# Patient Record
Sex: Male | Born: 1945 | Race: White | Hispanic: No | Marital: Married | Smoking: Former smoker
Health system: Southern US, Community
[De-identification: ages and names within clinical notes are randomized; demographics above are authoritative.]

## PROBLEM LIST (undated history)

## (undated) DIAGNOSIS — J189 Pneumonia, unspecified organism: Secondary | ICD-10-CM

## (undated) DIAGNOSIS — R55 Syncope and collapse: Secondary | ICD-10-CM

## (undated) DIAGNOSIS — E079 Disorder of thyroid, unspecified: Secondary | ICD-10-CM

## (undated) DIAGNOSIS — M2550 Pain in unspecified joint: Secondary | ICD-10-CM

## (undated) DIAGNOSIS — Z9989 Dependence on other enabling machines and devices: Secondary | ICD-10-CM

## (undated) DIAGNOSIS — M1990 Unspecified osteoarthritis, unspecified site: Secondary | ICD-10-CM

## (undated) DIAGNOSIS — I10 Essential (primary) hypertension: Secondary | ICD-10-CM

## (undated) DIAGNOSIS — G20 Parkinson's disease: Secondary | ICD-10-CM

## (undated) DIAGNOSIS — D759 Disease of blood and blood-forming organs, unspecified: Secondary | ICD-10-CM

## (undated) DIAGNOSIS — R7303 Prediabetes: Secondary | ICD-10-CM

## (undated) DIAGNOSIS — G20C Parkinsonism, unspecified: Secondary | ICD-10-CM

## (undated) DIAGNOSIS — Z9189 Other specified personal risk factors, not elsewhere classified: Secondary | ICD-10-CM

## (undated) DIAGNOSIS — C679 Malignant neoplasm of bladder, unspecified: Secondary | ICD-10-CM

## (undated) DIAGNOSIS — F32A Depression, unspecified: Secondary | ICD-10-CM

## (undated) DIAGNOSIS — N200 Calculus of kidney: Secondary | ICD-10-CM

## (undated) DIAGNOSIS — H9190 Unspecified hearing loss, unspecified ear: Secondary | ICD-10-CM

## (undated) DIAGNOSIS — G4733 Obstructive sleep apnea (adult) (pediatric): Secondary | ICD-10-CM

## (undated) DIAGNOSIS — M549 Dorsalgia, unspecified: Secondary | ICD-10-CM

## (undated) DIAGNOSIS — Z87442 Personal history of urinary calculi: Secondary | ICD-10-CM

## (undated) DIAGNOSIS — F329 Major depressive disorder, single episode, unspecified: Secondary | ICD-10-CM

## (undated) DIAGNOSIS — G8929 Other chronic pain: Secondary | ICD-10-CM

## (undated) DIAGNOSIS — G44009 Cluster headache syndrome, unspecified, not intractable: Secondary | ICD-10-CM

## (undated) DIAGNOSIS — F419 Anxiety disorder, unspecified: Secondary | ICD-10-CM

## (undated) HISTORY — PX: REFRACTIVE SURGERY: SHX103

## (undated) HISTORY — PX: OTHER SURGICAL HISTORY: SHX169

## (undated) HISTORY — DX: Essential (primary) hypertension: I10

## (undated) HISTORY — DX: Disorder of thyroid, unspecified: E07.9

## (undated) HISTORY — DX: Unspecified hearing loss, unspecified ear: H91.90

## (undated) HISTORY — PX: CARPAL TUNNEL RELEASE: SHX101

## (undated) HISTORY — DX: Other chronic pain: G89.29

## (undated) HISTORY — DX: Syncope and collapse: R55

## (undated) HISTORY — PX: BLADDER SURGERY: SHX569

## (undated) HISTORY — DX: Anxiety disorder, unspecified: F41.9

## (undated) HISTORY — DX: Cluster headache syndrome, unspecified, not intractable: G44.009

## (undated) HISTORY — PX: RETINAL DETACHMENT SURGERY: SHX105

## (undated) HISTORY — DX: Malignant neoplasm of bladder, unspecified: C67.9

## (undated) HISTORY — PX: CYSTOSCOPY: SUR368

## (undated) HISTORY — DX: Parkinson's disease: G20

## (undated) HISTORY — DX: Dorsalgia, unspecified: M54.9

## (undated) HISTORY — DX: Parkinsonism, unspecified: G20.C

## (undated) HISTORY — PX: BACK SURGERY: SHX140

---
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
## 1998-12-20 ENCOUNTER — Emergency Department (HOSPITAL_COMMUNITY): Admission: EM | Admit: 1998-12-20 | Discharge: 1998-12-20 | Payer: Self-pay | Admitting: Internal Medicine

## 2001-09-02 ENCOUNTER — Emergency Department (HOSPITAL_COMMUNITY): Admission: AC | Admit: 2001-09-02 | Discharge: 2001-09-03 | Payer: Self-pay

## 2002-03-24 ENCOUNTER — Ambulatory Visit (HOSPITAL_COMMUNITY): Admission: RE | Admit: 2002-03-24 | Discharge: 2002-03-24 | Payer: Self-pay | Admitting: Internal Medicine

## 2002-03-24 ENCOUNTER — Encounter: Payer: Self-pay | Admitting: Internal Medicine

## 2003-06-12 ENCOUNTER — Ambulatory Visit (HOSPITAL_COMMUNITY): Admission: RE | Admit: 2003-06-12 | Discharge: 2003-06-12 | Payer: Self-pay | Admitting: Internal Medicine

## 2003-06-12 ENCOUNTER — Encounter: Payer: Self-pay | Admitting: Internal Medicine

## 2005-01-22 ENCOUNTER — Ambulatory Visit: Payer: Self-pay | Admitting: Orthopedic Surgery

## 2005-02-04 ENCOUNTER — Ambulatory Visit: Payer: Self-pay | Admitting: Orthopedic Surgery

## 2005-03-04 ENCOUNTER — Ambulatory Visit: Payer: Self-pay | Admitting: Orthopedic Surgery

## 2006-07-15 ENCOUNTER — Ambulatory Visit (HOSPITAL_COMMUNITY): Admission: RE | Admit: 2006-07-15 | Discharge: 2006-07-15 | Payer: Self-pay | Admitting: Family Medicine

## 2006-07-26 ENCOUNTER — Encounter (INDEPENDENT_AMBULATORY_CARE_PROVIDER_SITE_OTHER): Payer: Self-pay | Admitting: *Deleted

## 2006-07-26 ENCOUNTER — Ambulatory Visit (HOSPITAL_COMMUNITY): Admission: RE | Admit: 2006-07-26 | Discharge: 2006-07-27 | Payer: Self-pay | Admitting: Urology

## 2006-07-29 ENCOUNTER — Observation Stay (HOSPITAL_COMMUNITY): Admission: EM | Admit: 2006-07-29 | Discharge: 2006-07-30 | Payer: Self-pay | Admitting: Emergency Medicine

## 2006-08-20 ENCOUNTER — Emergency Department (HOSPITAL_COMMUNITY): Admission: EM | Admit: 2006-08-20 | Discharge: 2006-08-20 | Payer: Self-pay | Admitting: Emergency Medicine

## 2006-08-21 ENCOUNTER — Emergency Department (HOSPITAL_COMMUNITY): Admission: EM | Admit: 2006-08-21 | Discharge: 2006-08-21 | Payer: Self-pay | Admitting: *Deleted

## 2006-09-03 ENCOUNTER — Encounter (INDEPENDENT_AMBULATORY_CARE_PROVIDER_SITE_OTHER): Payer: Self-pay | Admitting: Specialist

## 2006-09-04 ENCOUNTER — Inpatient Hospital Stay (HOSPITAL_COMMUNITY): Admission: RE | Admit: 2006-09-04 | Discharge: 2006-09-05 | Payer: Self-pay | Admitting: Urology

## 2006-10-18 ENCOUNTER — Ambulatory Visit (HOSPITAL_COMMUNITY): Payer: Self-pay | Admitting: Psychology

## 2006-10-28 ENCOUNTER — Ambulatory Visit (HOSPITAL_COMMUNITY): Payer: Self-pay | Admitting: Psychology

## 2006-11-04 ENCOUNTER — Ambulatory Visit (HOSPITAL_COMMUNITY): Payer: Self-pay | Admitting: Psychology

## 2006-11-11 ENCOUNTER — Ambulatory Visit (HOSPITAL_COMMUNITY): Payer: Self-pay | Admitting: Psychology

## 2006-11-25 ENCOUNTER — Ambulatory Visit (HOSPITAL_COMMUNITY): Payer: Self-pay | Admitting: Psychology

## 2006-12-14 ENCOUNTER — Ambulatory Visit (HOSPITAL_COMMUNITY): Payer: Self-pay | Admitting: Psychology

## 2007-01-18 ENCOUNTER — Ambulatory Visit (HOSPITAL_COMMUNITY): Payer: Self-pay | Admitting: Psychology

## 2007-02-18 ENCOUNTER — Ambulatory Visit (HOSPITAL_COMMUNITY): Payer: Self-pay | Admitting: Psychology

## 2007-04-01 ENCOUNTER — Ambulatory Visit (HOSPITAL_COMMUNITY): Payer: Self-pay | Admitting: Psychology

## 2007-05-03 ENCOUNTER — Ambulatory Visit (HOSPITAL_COMMUNITY): Payer: Self-pay | Admitting: Psychology

## 2007-06-03 ENCOUNTER — Ambulatory Visit (HOSPITAL_COMMUNITY): Payer: Self-pay | Admitting: Psychology

## 2007-07-08 ENCOUNTER — Ambulatory Visit (HOSPITAL_COMMUNITY): Payer: Self-pay | Admitting: Psychology

## 2007-07-14 ENCOUNTER — Encounter (INDEPENDENT_AMBULATORY_CARE_PROVIDER_SITE_OTHER): Payer: Self-pay | Admitting: Urology

## 2007-07-14 ENCOUNTER — Ambulatory Visit (HOSPITAL_BASED_OUTPATIENT_CLINIC_OR_DEPARTMENT_OTHER): Admission: RE | Admit: 2007-07-14 | Discharge: 2007-07-14 | Payer: Self-pay | Admitting: Urology

## 2007-07-21 ENCOUNTER — Ambulatory Visit (HOSPITAL_COMMUNITY): Payer: Self-pay | Admitting: Psychology

## 2007-08-12 ENCOUNTER — Encounter (INDEPENDENT_AMBULATORY_CARE_PROVIDER_SITE_OTHER): Payer: Self-pay | Admitting: Urology

## 2007-08-12 ENCOUNTER — Ambulatory Visit (HOSPITAL_BASED_OUTPATIENT_CLINIC_OR_DEPARTMENT_OTHER): Admission: RE | Admit: 2007-08-12 | Discharge: 2007-08-12 | Payer: Self-pay | Admitting: Urology

## 2007-08-23 ENCOUNTER — Ambulatory Visit (HOSPITAL_COMMUNITY): Payer: Self-pay | Admitting: Psychology

## 2007-09-20 ENCOUNTER — Ambulatory Visit (HOSPITAL_COMMUNITY): Payer: Self-pay | Admitting: Psychology

## 2007-10-19 ENCOUNTER — Ambulatory Visit (HOSPITAL_COMMUNITY): Payer: Self-pay | Admitting: Psychology

## 2007-10-21 ENCOUNTER — Ambulatory Visit (HOSPITAL_COMMUNITY): Admission: RE | Admit: 2007-10-21 | Discharge: 2007-10-21 | Payer: Self-pay | Admitting: Family Medicine

## 2007-11-22 ENCOUNTER — Ambulatory Visit (HOSPITAL_COMMUNITY): Payer: Self-pay | Admitting: Psychology

## 2007-12-26 ENCOUNTER — Encounter (INDEPENDENT_AMBULATORY_CARE_PROVIDER_SITE_OTHER): Payer: Self-pay | Admitting: Urology

## 2007-12-26 ENCOUNTER — Ambulatory Visit (HOSPITAL_BASED_OUTPATIENT_CLINIC_OR_DEPARTMENT_OTHER): Admission: RE | Admit: 2007-12-26 | Discharge: 2007-12-27 | Payer: Self-pay | Admitting: Urology

## 2007-12-31 ENCOUNTER — Emergency Department (HOSPITAL_COMMUNITY): Admission: EM | Admit: 2007-12-31 | Discharge: 2007-12-31 | Payer: Self-pay | Admitting: Emergency Medicine

## 2008-02-07 ENCOUNTER — Ambulatory Visit (HOSPITAL_COMMUNITY): Payer: Self-pay | Admitting: Psychology

## 2008-02-10 ENCOUNTER — Ambulatory Visit (HOSPITAL_COMMUNITY): Payer: Self-pay | Admitting: Psychology

## 2008-03-20 ENCOUNTER — Ambulatory Visit (HOSPITAL_COMMUNITY): Payer: Self-pay | Admitting: Psychology

## 2008-04-23 ENCOUNTER — Ambulatory Visit (HOSPITAL_COMMUNITY): Payer: Self-pay | Admitting: Psychology

## 2009-04-23 ENCOUNTER — Ambulatory Visit (HOSPITAL_COMMUNITY): Payer: Self-pay | Admitting: Psychology

## 2009-04-27 ENCOUNTER — Emergency Department (HOSPITAL_COMMUNITY): Admission: EM | Admit: 2009-04-27 | Discharge: 2009-04-27 | Payer: Self-pay | Admitting: Emergency Medicine

## 2009-05-08 ENCOUNTER — Ambulatory Visit (HOSPITAL_COMMUNITY): Payer: Self-pay | Admitting: Psychology

## 2009-05-22 ENCOUNTER — Ambulatory Visit (HOSPITAL_COMMUNITY): Payer: Self-pay | Admitting: Psychology

## 2009-06-17 ENCOUNTER — Ambulatory Visit (HOSPITAL_COMMUNITY): Payer: Self-pay | Admitting: Psychology

## 2009-07-02 ENCOUNTER — Encounter (HOSPITAL_COMMUNITY): Admission: RE | Admit: 2009-07-02 | Discharge: 2009-08-01 | Payer: Self-pay | Admitting: Neurosurgery

## 2009-07-17 ENCOUNTER — Ambulatory Visit (HOSPITAL_COMMUNITY): Payer: Self-pay | Admitting: Psychology

## 2009-10-21 ENCOUNTER — Ambulatory Visit (HOSPITAL_COMMUNITY): Admission: RE | Admit: 2009-10-21 | Discharge: 2009-10-21 | Payer: Self-pay | Admitting: Neurosurgery

## 2009-10-28 ENCOUNTER — Ambulatory Visit (HOSPITAL_COMMUNITY): Admission: RE | Admit: 2009-10-28 | Discharge: 2009-10-28 | Payer: Self-pay | Admitting: Neurosurgery

## 2009-12-24 ENCOUNTER — Ambulatory Visit (HOSPITAL_COMMUNITY): Admission: RE | Admit: 2009-12-24 | Discharge: 2009-12-25 | Payer: Self-pay | Admitting: Neurosurgery

## 2010-02-03 ENCOUNTER — Encounter (HOSPITAL_COMMUNITY): Admission: RE | Admit: 2010-02-03 | Discharge: 2010-03-05 | Payer: Self-pay | Admitting: Neurosurgery

## 2010-02-21 ENCOUNTER — Emergency Department (HOSPITAL_COMMUNITY): Admission: EM | Admit: 2010-02-21 | Discharge: 2010-02-21 | Payer: Self-pay | Admitting: Emergency Medicine

## 2010-03-06 ENCOUNTER — Encounter (HOSPITAL_COMMUNITY): Admission: RE | Admit: 2010-03-06 | Discharge: 2010-04-05 | Payer: Self-pay | Admitting: Neurosurgery

## 2010-03-09 ENCOUNTER — Ambulatory Visit (HOSPITAL_COMMUNITY): Admission: RE | Admit: 2010-03-09 | Discharge: 2010-03-09 | Payer: Self-pay | Admitting: Emergency Medicine

## 2010-03-26 ENCOUNTER — Ambulatory Visit (HOSPITAL_COMMUNITY): Payer: Self-pay | Admitting: Psychology

## 2010-07-01 ENCOUNTER — Emergency Department (HOSPITAL_COMMUNITY): Admission: EM | Admit: 2010-07-01 | Discharge: 2010-07-01 | Payer: Self-pay | Admitting: Emergency Medicine

## 2010-07-10 ENCOUNTER — Ambulatory Visit: Payer: Self-pay | Admitting: Otolaryngology

## 2010-07-11 ENCOUNTER — Encounter: Admission: RE | Admit: 2010-07-11 | Discharge: 2010-07-11 | Payer: Self-pay | Admitting: Neurosurgery

## 2010-07-15 ENCOUNTER — Ambulatory Visit (HOSPITAL_COMMUNITY): Admission: RE | Admit: 2010-07-15 | Discharge: 2010-07-15 | Payer: Self-pay | Admitting: Otolaryngology

## 2010-07-24 ENCOUNTER — Ambulatory Visit: Payer: Self-pay | Admitting: Otolaryngology

## 2010-10-22 ENCOUNTER — Ambulatory Visit: Payer: Self-pay | Admitting: Orthopedic Surgery

## 2010-10-22 DIAGNOSIS — M659 Synovitis and tenosynovitis, unspecified: Secondary | ICD-10-CM | POA: Insufficient documentation

## 2010-10-22 DIAGNOSIS — M65979 Unspecified synovitis and tenosynovitis, unspecified ankle and foot: Secondary | ICD-10-CM | POA: Insufficient documentation

## 2010-11-13 ENCOUNTER — Ambulatory Visit: Payer: Self-pay | Admitting: Otolaryngology

## 2010-12-05 ENCOUNTER — Ambulatory Visit (HOSPITAL_COMMUNITY): Payer: Self-pay | Admitting: Psychology

## 2010-12-15 ENCOUNTER — Ambulatory Visit (HOSPITAL_COMMUNITY): Payer: Self-pay | Admitting: Psychology

## 2010-12-17 ENCOUNTER — Ambulatory Visit: Payer: Self-pay | Admitting: Orthopedic Surgery

## 2010-12-31 ENCOUNTER — Ambulatory Visit (HOSPITAL_COMMUNITY)
Admission: RE | Admit: 2010-12-31 | Discharge: 2010-12-31 | Payer: Self-pay | Source: Home / Self Care | Attending: Psychology | Admitting: Psychology

## 2011-01-14 ENCOUNTER — Ambulatory Visit (HOSPITAL_COMMUNITY)
Admission: RE | Admit: 2011-01-14 | Discharge: 2011-01-14 | Payer: Self-pay | Source: Home / Self Care | Attending: Psychology | Admitting: Psychology

## 2011-01-18 ENCOUNTER — Encounter: Payer: Self-pay | Admitting: Orthopedic Surgery

## 2011-01-19 ENCOUNTER — Encounter: Payer: Self-pay | Admitting: Internal Medicine

## 2011-01-27 NOTE — Progress Notes (Signed)
Summary: initial evaluation  initial evaluation   Imported By: Jacklynn Ganong 10/21/2010 10:47:43  _____________________________________________________________________  External Attachment:    Type:   Image     Comment:   External Document

## 2011-01-27 NOTE — Progress Notes (Signed)
Summary: Progress note  Progress note   Imported By: Jacklynn Ganong 10/21/2010 10:48:35  _____________________________________________________________________  External Attachment:    Type:   Image     Comment:   External Document

## 2011-01-27 NOTE — Assessment & Plan Note (Signed)
Summary: LEFT ANKLE PAIN NEEDS XR/BCBS/BSF   Vital Signs:  Patient profile:   65 year old male Height:      72 inches Weight:      245 pounds Pulse rate:   72 / minute Resp:     16 per minute  Vitals Entered By: Fuller Canada MD (October 22, 2010 3:15 PM)  Visit Type:  new patient Referring Provider:  self Primary Provider:  Dr. Dwana Melena  CC:  left foot pain.  History of Present Illness: I saw Kent Semrad in the office today for an initial visit.  He is a 65 years old man with the complaint of:  left foot pain.  Xrays today.  No injury.  Meds: Diovan/HCTZ, Diazapem, Bethanechol,Vicodin 5, Multivitamin.  This is a 65 year old male who denies any history of trauma but complains of a dull aching pain on the medial side of his ankle which is moderate in severity he tends to be constant worse with weightbearing and activity.  Denies any catching or locking.      Allergies (verified): 1)  ! Sulfa  Past History:  Past Medical History: Back pain htn anxiety  Past Surgical History: back L4-5 bladder cancer left knee salk  Family History: FH of Cancer:   Social History: Patient is married.  retired no smoking no alcohol no caffeine  12th grade ed.  Review of Systems Constitutional:  Denies weight loss, weight gain, fever, chills, and fatigue. Cardiovascular:  Denies chest pain, palpitations, fainting, and murmurs. Respiratory:  Denies short of breath, wheezing, couch, tightness, pain on inspiration, and snoring . Gastrointestinal:  Denies heartburn, nausea, vomiting, diarrhea, constipation, and blood in your stools. Genitourinary:  Complains of bleeding in urine; denies frequency, urgency, difficulty urinating, painful urination, and flank pain. Neurologic:  Denies numbness, tingling, unsteady gait, dizziness, tremors, and seizure. Musculoskeletal:  Denies joint pain, swelling, instability, stiffness, redness, heat, and muscle pain. Endocrine:  Denies  excessive thirst, exessive urination, and heat or cold intolerance. Psychiatric:  Complains of depression and anxiety; denies nervousness and hallucinations. Skin:  Denies changes in the skin, poor healing, rash, itching, and redness. HEENT:  Denies blurred or double vision, eye pain, redness, and watering. Immunology:  Denies seasonal allergies, sinus problems, and allergic to bee stings. Hemoatologic:  Denies easy bleeding and brusing.  Physical Exam  Skin:  intact without lesions or rashes Inguinal Nodes:  no significant adenopathy Psych:  alert and cooperative; normal mood and affect; normal attention span and concentration   Foot/Ankle Exam  General:    Well-developed, well-nourished ,normal body habitus; no deformities, normal grooming.    Gait:    antalgic.  somewhat related to her recent lumbar disc surgery  Inspection:    swelling: medial LEFT ankle  Palpation:    medial side of LEFT ankle also the posterior tibial tendon  Vascular:    normal pulses and perfusion except for some mild varicosity   Sensory:    gross sensation intact bilaterally in lower extremities.    Motor:    I do not detect any weakness in his plantar flexors her dorsiflexors, his evertors her inverters    Reflexes:    ankle jerk normal  Ankle Exam:    Left:    Stability:  ankle is stable    Inspection/Palpation:  he has 10 of dorsiflexion and 20 of plantarflexion he hasn't subtalar joint which is mobile   Impression & Recommendations:  Problem # 1:  TENOSYNOVITIS OF FOOT AND ANKLE (ICD-727.06) Assessment New  Orders: New Patient Level III (91478) Ankle x-ray complete,  minimum 3 views (73610)/left ankle radiographs of the ankle joint itself showed no major joint space narrowing no bony alignment problems no bone issues  Impression stable ankle and normal ankle  Medications Added to Medication List This Visit: 1)  Diclofenac Potassium 50 Mg Tabs (Diclofenac potassium) .... One  by mouth bid  Patient Instructions: 1)  Wear ASO brace continuously for 6 weeks while walking 2)  Start new medication 3)  come back in 6 weeks Prescriptions: DICLOFENAC POTASSIUM 50 MG TABS (DICLOFENAC POTASSIUM) one by mouth bid  #90 x 0   Entered and Authorized by:   Fuller Canada MD   Signed by:   Fuller Canada MD on 10/22/2010   Method used:   Print then Give to Patient   RxID:   [id number]    Orders Added: 1)  New Patient Level III [62952] 2)  Ankle x-ray complete,  minimum 3 views [84132]

## 2011-01-28 ENCOUNTER — Ambulatory Visit (HOSPITAL_COMMUNITY): Admit: 2011-01-28 | Payer: Self-pay | Admitting: Psychology

## 2011-01-28 ENCOUNTER — Encounter (INDEPENDENT_AMBULATORY_CARE_PROVIDER_SITE_OTHER): Payer: BC Managed Care – PPO | Admitting: Psychology

## 2011-01-28 ENCOUNTER — Encounter (HOSPITAL_COMMUNITY): Payer: Self-pay | Admitting: Psychology

## 2011-01-28 DIAGNOSIS — F411 Generalized anxiety disorder: Secondary | ICD-10-CM

## 2011-01-28 DIAGNOSIS — F332 Major depressive disorder, recurrent severe without psychotic features: Secondary | ICD-10-CM

## 2011-02-11 ENCOUNTER — Encounter (HOSPITAL_COMMUNITY): Payer: Self-pay | Admitting: Psychology

## 2011-02-18 ENCOUNTER — Encounter (INDEPENDENT_AMBULATORY_CARE_PROVIDER_SITE_OTHER): Payer: BC Managed Care – PPO | Admitting: Psychology

## 2011-02-18 DIAGNOSIS — F332 Major depressive disorder, recurrent severe without psychotic features: Secondary | ICD-10-CM

## 2011-02-18 DIAGNOSIS — F411 Generalized anxiety disorder: Secondary | ICD-10-CM

## 2011-03-04 ENCOUNTER — Encounter (INDEPENDENT_AMBULATORY_CARE_PROVIDER_SITE_OTHER): Payer: BC Managed Care – PPO | Admitting: Psychology

## 2011-03-04 DIAGNOSIS — F411 Generalized anxiety disorder: Secondary | ICD-10-CM

## 2011-03-04 DIAGNOSIS — F332 Major depressive disorder, recurrent severe without psychotic features: Secondary | ICD-10-CM

## 2011-03-14 LAB — CREATININE, SERUM
Creatinine, Ser: 1.24 mg/dL (ref 0.4–1.5)
GFR calc Af Amer: >60 mL/min (ref >60)
GFR calc non Af Amer: 59 mL/min — ABNORMAL LOW (ref >60)

## 2011-03-15 LAB — DIFFERENTIAL
Basophils Absolute: 0 10*3/uL (ref 0.0–0.1)
Basophils Relative: 0 % (ref 0–1)
Lymphs Abs: 2.2 10*3/uL (ref 0.7–4.0)
Monocytes Absolute: 0.7 10*3/uL (ref 0.1–1.0)
Monocytes Relative: 6 % (ref 3–12)
Neutro Abs: 9.5 10*3/uL — ABNORMAL HIGH (ref 1.7–7.7)
Neutrophils Relative %: 76 % (ref 43–77)

## 2011-03-15 LAB — CBC
HCT: 41.4 % (ref 39.0–52.0)
Hemoglobin: 14 g/dL (ref 13.0–17.0)
Platelets: 235 10*3/uL (ref 150–400)
RDW: 13.8 % (ref 11.5–15.5)
WBC: 12.5 10*3/uL — ABNORMAL HIGH (ref 4.0–10.5)

## 2011-03-15 LAB — URINALYSIS, ROUTINE W REFLEX MICROSCOPIC
Glucose, UA: NEGATIVE mg/dL
Ketones, ur: NEGATIVE mg/dL
Nitrite: NEGATIVE
Specific Gravity, Urine: 1.02 (ref 1.005–1.030)
pH: 5.5 (ref 5.0–8.0)

## 2011-03-15 LAB — BASIC METABOLIC PANEL
CO2: 28 mEq/L (ref 19–32)
Chloride: 102 mEq/L (ref 96–112)
Creatinine, Ser: 1.17 mg/dL (ref 0.4–1.5)
Potassium: 4.2 mEq/L (ref 3.5–5.1)

## 2011-03-19 ENCOUNTER — Encounter (INDEPENDENT_AMBULATORY_CARE_PROVIDER_SITE_OTHER): Payer: BC Managed Care – PPO | Admitting: Psychology

## 2011-03-19 DIAGNOSIS — F411 Generalized anxiety disorder: Secondary | ICD-10-CM

## 2011-03-19 DIAGNOSIS — F332 Major depressive disorder, recurrent severe without psychotic features: Secondary | ICD-10-CM

## 2011-03-24 ENCOUNTER — Ambulatory Visit (INDEPENDENT_AMBULATORY_CARE_PROVIDER_SITE_OTHER): Payer: BC Managed Care – PPO | Admitting: Psychiatry

## 2011-03-24 DIAGNOSIS — F331 Major depressive disorder, recurrent, moderate: Secondary | ICD-10-CM
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
## 2011-03-30 LAB — BASIC METABOLIC PANEL
CO2: 27 mEq/L (ref 19–32)
Calcium: 9.1 mg/dL (ref 8.4–10.5)
Creatinine, Ser: 1.04 mg/dL (ref 0.4–1.5)
GFR calc non Af Amer: >60 mL/min (ref >60)
Glucose, Bld: 99 mg/dL (ref 70–99)
Sodium: 137 mEq/L (ref 135–145)

## 2011-03-30 LAB — CBC
HCT: 43.8 % (ref 39.0–52.0)
Hemoglobin: 14.9 g/dL (ref 13.0–17.0)
MCV: 90.9 fL (ref 78.0–100.0)
Platelets: 263 10*3/uL (ref 150–400)
RBC: 4.82 MIL/uL (ref 4.22–5.81)
RDW: 14.5 % (ref 11.5–15.5)
WBC: 10.9 10*3/uL — ABNORMAL HIGH (ref 4.0–10.5)

## 2011-03-30 LAB — GLUCOSE, CAPILLARY: Glucose-Capillary: 108 mg/dL — ABNORMAL HIGH (ref 70–99)

## 2011-04-02 LAB — CBC
HCT: 40.2 % (ref 39.0–52.0)
Hemoglobin: 13.9 g/dL (ref 13.0–17.0)
MCHC: 34.7 g/dL (ref 30.0–36.0)
MCV: 89.7 fL (ref 78.0–100.0)
RBC: 4.48 MIL/uL (ref 4.22–5.81)
WBC: 6 10*3/uL (ref 4.0–10.5)

## 2011-04-02 LAB — BASIC METABOLIC PANEL
CO2: 32 mEq/L (ref 19–32)
Chloride: 102 mEq/L (ref 96–112)
GFR calc Af Amer: >60 mL/min (ref >60)

## 2011-04-09 ENCOUNTER — Encounter (INDEPENDENT_AMBULATORY_CARE_PROVIDER_SITE_OTHER): Payer: BC Managed Care – PPO | Admitting: Psychology

## 2011-04-09 DIAGNOSIS — F332 Major depressive disorder, recurrent severe without psychotic features: Secondary | ICD-10-CM

## 2011-04-09 DIAGNOSIS — F411 Generalized anxiety disorder: Secondary | ICD-10-CM

## 2011-04-13 ENCOUNTER — Encounter: Payer: Self-pay | Admitting: Orthopedic Surgery

## 2011-04-14 ENCOUNTER — Encounter (INDEPENDENT_AMBULATORY_CARE_PROVIDER_SITE_OTHER): Payer: BC Managed Care – PPO | Admitting: Psychiatry

## 2011-04-14 DIAGNOSIS — F331 Major depressive disorder, recurrent, moderate: Secondary | ICD-10-CM

## 2011-04-22 ENCOUNTER — Other Ambulatory Visit: Payer: BC Managed Care – PPO | Admitting: Orthopedic Surgery

## 2011-04-22 ENCOUNTER — Encounter: Payer: Self-pay | Admitting: Orthopedic Surgery

## 2011-04-22 ENCOUNTER — Ambulatory Visit (INDEPENDENT_AMBULATORY_CARE_PROVIDER_SITE_OTHER): Payer: BC Managed Care – PPO | Admitting: Orthopedic Surgery

## 2011-04-22 VITALS — HR 74 | Resp 18 | Ht 73.0 in | Wt 245.0 lb

## 2011-04-22 DIAGNOSIS — M542 Cervicalgia: Secondary | ICD-10-CM

## 2011-04-22 MED ORDER — GABAPENTIN 100 MG PO CAPS: 100.0000 mg | ORAL_CAPSULE | Freq: Three times a day (TID) | ORAL | Status: DC

## 2011-04-22 NOTE — Patient Instructions (Signed)
You have received a steroid shot. 15% of patients experience increased pain at the injection site with in the next 24 hours. This is best treated with ice and tylenol extra strength 2 tabs every 8 hours. If you are still having pain please call the office.    

## 2011-04-22 NOTE — Progress Notes (Signed)
65-year-old male with LEFT parascapular pain x6 weeks which started after he lifted a chair.  He complains of constant dull pain over the medial border of the scapula which is 7/10 and it's intensity and relieved by Percocet.  He takes Percocet for his back pain which is chronic.  He has not lost any range of motion his pain is worse with driving and moving his arm.  He has some lateral forearm pain and some numbness in the small and ring finger of his LEFT hand.  No other treatment at this point.  He did try some ice.  His review of systems all were reviewed and are negative except for history of sleep apnea requiring CPAP machine, history of bladder cancer difficulty urinating hematuria.  Dizziness anxiety depression.  His vital signs are tight 61 weight 245  His appearance is normal  He has normal pulse and perfusion in his LEFT upper extremity.  He is no lymphadenopathy in the LEFT axilla or LEFT supraclavicular region and the skin of his LEFT upper extremity neck and thoracic areas are normal  He does have decreased sensation in the small and ring finger LEFT hand he has equal reflexes 2+ at the elbow and wrist.  Bilaterally.  He is awake alert and oriented x3 his mood and affect are normal.  He ambulates without assistive device  His RIGHT upper extremity shows no abnormality on inspection or palpation.  Has full range of motion normal strength and his ligaments are stable   The LEFT upper extremity is tender over the medial border of the scapula, he is not tender over the coracoid or anterolateral deltoid or anterolateral acromion.  His strength is normal and his stability tests are normal.  He has no impingement.  He has tenderness at the base of the cervical spine and palpation there since a numb sensation towards the medial border of the scapula  The CT scan was done for evaluation of a neck mass it was normal that was done in 2011.  It did show mild degenerative disc disease C3-C4,  C5-C6, C6-C7,.  Impression trigger point Tennis elbow  Addendum LEFT elbow tenderness over the lateral epicondyle and radial head slight decrease in elbow extension normal flexion normal pronation and supination  Plan inject trigger point tenderness elbow brace followup 2 weeks take Neurontin 100 mg 3 times a day

## 2011-04-22 NOTE — Discharge Summary (Signed)
Consent obtained a timeout completed sterile prep with alcohol and ethyl chloride for anesthesia 25-gauge needle used to inject the medial border of the scapula with 40 mg of Depo-Medrol and 2 cc 1% lidocaine.  Tolerated well without complication.

## 2011-05-06 ENCOUNTER — Ambulatory Visit (INDEPENDENT_AMBULATORY_CARE_PROVIDER_SITE_OTHER): Payer: BC Managed Care – PPO | Admitting: Orthopedic Surgery

## 2011-05-06 DIAGNOSIS — M7710 Lateral epicondylitis, unspecified elbow: Secondary | ICD-10-CM | POA: Insufficient documentation

## 2011-05-06 DIAGNOSIS — M79609 Pain in unspecified limb: Secondary | ICD-10-CM

## 2011-05-06 MED ORDER — METHYLPREDNISOLONE ACETATE 40 MG/ML IJ SUSP: 40.0000 mg | Freq: Once | INTRAMUSCULAR | Status: DC

## 2011-05-06 NOTE — Discharge Summary (Signed)
LEFT injection  Lateral epicondyle injection.  Consent.  Timeout to confirm site.  LEFT elbow was injected with Depo-Medrol 40 mg and lidocaine 1% 3 cc with sterile technique using alcohol and ethyl chloride prep.  There were no complications  LEFT shoulder injection for trigger point  Consent was obtained.  Time out was taken   LEFT Shoulder was injected with Depo-Medrol 40 mg plus lidocaine 1% 4 cc.  Shoulder was prepped with alcohol and anesthetized with ethyl chloride.  The injection was tolerated without complication.

## 2011-05-06 NOTE — Patient Instructions (Signed)
You have received a steroid shot. 15% of patients experience increased pain at the injection site with in the next 24 hours. This is best treated with ice and tylenol extra strength 2 tabs every 8 hours. If you are still having pain please call the office.    Do exercises for elbow  advised to apply ice or cold packs intermittently as needed to relieve pain in the elbow

## 2011-05-06 NOTE — Progress Notes (Signed)
   65 year old male with a trigger point, LEFT medial scapula, and tennis elbow.  Received an injection in the LEFT superior border of the scapula, which did improve his symptoms. His pain is less, but he still has symptoms when he is driving. He denies any neck pain or neck stiffness.  His LEFT elbow was treated for tennis elbow with LEFT elbow brace. That seems to still be symptomatic.  On exam, normal range of motion in the cervical spine, but no tenderness palpable. He is palpably tender over the medial border of the scapula at the superior angle.  Palpation there sends radicular pain down his LEFT arm  He also has tenderness over the lateral epicondyle of the elbow on the LEFT.  Recommended injection in the trigger point in the LEFT elbow and then continue tennis elbow program and brace  Diagnoses #1 trigger point Diagnosis #2 tennis elbow, LEFT  One month  followup

## 2011-05-12 ENCOUNTER — Encounter (INDEPENDENT_AMBULATORY_CARE_PROVIDER_SITE_OTHER): Payer: BC Managed Care – PPO | Admitting: Psychiatry

## 2011-05-12 DIAGNOSIS — F331 Major depressive disorder, recurrent, moderate: Secondary | ICD-10-CM

## 2011-05-12 NOTE — Op Note (Signed)
NAME:  Zachary [name], Zachary [name]                 ACCOUNT NO.:  [id number]   MEDICAL RECORD NO.:  [id number]          PATIENT TYPE:  AMB   LOCATION:  [facility]                         FACILITY:  [facility]   PHYSICIAN:  Sigmund I. Patsi Sears, M.D.DATE OF BIRTH:  [birth year]   DATE OF PROCEDURE:  12/26/2007  DATE OF DISCHARGE:                               OPERATIVE REPORT   PREOPERATIVE DIAGNOSES:  Recurrent transitional cell carcinoma of the  bladder.   POSTOPERATIVE DIAGNOSES:  Recurrent transitional cell carcinoma of the  bladder.   OPERATION:  Cystourethroscopy, TUR bladder tumor (12 o'clock position at  the level of the bladder neck), insertion of mitomycin C.   SURGEON:  Dr. Jethro Bolus.   ANESTHESIA:  General LMA.   PREPARATION:  After appropriate preanesthesia, the patient is brought to  the operating room and placed on the operating table in the dorsal  supine position where general LMA anesthesia was introduced.  He was  then replaced in the dorsal lithotomy position where the pubis was  prepped with Betadine solution and draped in the usual fashion.   REVIEW OF HISTORY:  Zachary [name] is a 65 year old male, with a history of  TUR bladder tumor in July 2008 and sorry with a history of high-grade,  papillary, noninvasive bladder cancer, originally diagnosed in July  2007.  He was treated with multiple BCG treatments, and had a recurrence  of bladder tumor in July 2008 with TUR, as well as mitomycin C therapy.  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  Multiple random biopsies have  shown no recurrence of tumor in the past.   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  (Community-acquired).  Most recent cystoscopy has shown that the patient  has recurrence of his bladder tumor, at the 12 o'clock position, at the  bladder neck.  He  now presents for TUR bladder tumor, as well as of  mitomycin C installation.   DESCRIPTION OF PROCEDURE:  Cystourethroscopy was accomplished, and shows  the patient has a bladder tumor located at the 12 o'clock position, as  previously described.  It was very difficult to get to the tumor,  because of its location, tucked in behind the bladder neck.  Using  settings by adjusting the glycine inflow, and outflow of continuous flow  cystoscope, I was able to identify the tumor, photo document it, and  resect it. A small amount of prostate tissue was resected with it as  well.  Cauterization was accomplished so that no bleeding was noted at  the end of the procedure.  The bladder was irrigated free of all tumor,  repeat cystoscopy showed no obvious bleeding  points.  A 22 Foley catheter was placed, irrigated, and following  irrigation of clear urine, mitomycin C was instilled in the bladder.  It  will be left for 20 minutes, and then irrigated in the recovery room.  The patient tolerated the procedure well, was given IV Toradol,  awakened, and taken to  the recovery room in excellent condition.      Sigmund I. Patsi Sears, M.D.  Electronically Signed     SIT/MEDQ  D:  12/26/2007  T:  12/26/2007  Job:  [id number]

## 2011-05-12 NOTE — Op Note (Signed)
NAME:  Zachary [name], Zachary [name]                 ACCOUNT NO.:  [id number]   MEDICAL RECORD NO.:  [id number]          PATIENT TYPE:  AMB   LOCATION:  [facility]                         FACILITY:  [facility]   PHYSICIAN:  Sigmund I. Patsi Sears, M.D.DATE OF BIRTH:  01/30/1946   DATE OF PROCEDURE:  08/12/2007  DATE OF DISCHARGE:                               OPERATIVE REPORT   PREOPERATIVE DIAGNOSIS:  History of recurrent urothelial bladder cancer.   POSTOPERATIVE DIAGNOSIS:  History of recurrent urothelial bladder  cancer.   OPERATION:  1. Cystourethroscopy.  2. Random cold cup bladder biopsies.   SURGEON:  Sigmund I. Patsi Sears, M.D.   ANESTHESIA:  General LMA.   PREPARATION:  After appropriate preanesthesia, the patient was brought  to the operating room, placed on the operating room in the dorsal supine  position, where general LMA anesthesia was induced.  He was then  replaced in the dorsal lithotomy position, where the pubis was prepped  with Betadine solution and draped in the usual fashion.   REVIEW OF HISTORY:  Mr. Lincoln has a history of a TUR bladder tumor in  July 2008.  He has a history of high-grade papillary cancer, diagnosed  originally in July 2007.  He was treated with multiple BCG treatments  and more recently has had mitomycin-C therapy.  He has a past history of  resistant staph urinary tract infection, still sensitive to tetracycline  and vancomycin.  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  He is now for multiple  random bladder biopsies.   PROCEDURE:  Cystourethroscopy is accomplished, and I do not see evidence  of recurrent bladder cancer.  There is a red, erythematous spot,  presumably where his previous TUR bladder tumor was accomplished in July  , and a biopsy is taken here and also randomly around the bladder.  The  tissue was submitted to the laboratory for evaluation.  Each site was  cauterized.  The patient was then  awakened and taken to the recovery  room in good condition.      Sigmund I. Patsi Sears, M.D.  Electronically Signed     SIT/MEDQ  D:  08/12/2007  T:  08/13/2007  Job:  [id number]

## 2011-05-12 NOTE — Op Note (Signed)
NAME:  Zachary [name], Zachary [name]                 ACCOUNT NO.:  [id number]   MEDICAL RECORD NO.:  [id number]          PATIENT TYPE:  AMB   LOCATION:  [facility]                         FACILITY:  [facility]   PHYSICIAN:  Sigmund I. Patsi Sears, M.D.DATE OF BIRTH:  02/15/1946   DATE OF PROCEDURE:  07/14/2007  DATE OF DISCHARGE:                               OPERATIVE REPORT   PREOPERATIVE DIAGNOSIS:  Recurrent right bladder base bladder cancer.   POSTOPERATIVE DIAGNOSIS:  Recurrent right bladder base bladder cancer.   OPERATION:  Cystourethroscopy, transurethral resection of right bladder  base tumor, installation of mitomycin C in the bladder.   SURGEON:  Sigmund I. Patsi Sears, M.D.   ANESTHESIA:  General LMA.   PREPARATION:  After appropriate preanesthesia, the patient is brought to  the operating room and placed on the operating room table in the dorsal  supine position where general LMA anesthesia was induced.  He was then  replaced in the dorsal lithotomy position where the pubis was prepped  with Betadine solution and draped in the usual fashion.   REVIEW OF HISTORY:  This 65 year old male is status post TUR of a large  right bladder wall recurrent bladder cancer in September 2007, and now  has another right bladder base recurrence.  He is now for TUR bladder  tumor and mitomycin C installation.   PROCEDURE:  Cystourethroscopy was accomplished and shows the right  bladder base bladder tumor.  No other tumors were identified.  TUR  bladder tumor was accomplished and mitomycin C is instilled in the  bladder.  No bleeding was noted.  The patient tolerated the procedure  well.  He was given IV Toradol, B&O suppository, awakened, and taken to  the recovery room in good condition.      Sigmund I. Patsi Sears, M.D.  Electronically Signed     SIT/MEDQ  D:  07/14/2007  T:  07/14/2007  Job:  [id number]

## 2011-05-13 ENCOUNTER — Encounter (HOSPITAL_COMMUNITY): Payer: BC Managed Care – PPO | Admitting: Psychology

## 2011-05-14 ENCOUNTER — Telehealth: Payer: Self-pay | Admitting: Orthopedic Surgery

## 2011-05-14 NOTE — Telephone Encounter (Signed)
Received injection in shoulder 05/06/11, said he developed a red spot at injection site and it is very sore still.The injection has not helped his pain at all.  Says it is a painful,dull,ache all the time.  He is taking Hydrocodone for his back pain and it has not helped his shoulder at all.  Last night he took one of his wife's muscle relaxer pill (does not know the name of it) and it seemed to help .  Asking if you will prescribe something to help him, or what do you advise.  He uses [facility] in [place]. Cell # [id number]

## 2011-05-14 NOTE — Telephone Encounter (Signed)
Red spot is from cold spray  If he will tell me the name of the medicine we can use that

## 2011-05-15 NOTE — H&P (Signed)
NAME:  Zachary [name], Zachary [name]                 ACCOUNT NO.:  [id number]   MEDICAL RECORD NO.:  [id number]          PATIENT TYPE:  AMB   LOCATION:  DAY                          FACILITY:  [facility]   PHYSICIAN:  Sigmund I. Patsi Sears, M.D.DATE OF BIRTH:  08/10/1946   DATE OF ADMISSION:  09/03/2006  DATE OF DISCHARGE:                                HISTORY & PHYSICAL   CHIEF COMPLAINT:  Bladder tumor.   HISTORY OF PRESENT ILLNESS:  Zachary [name] is a 65 year old gentleman who was  seen and evaluated by Dr. Patsi Sears for a bladder tumor.  The bladder tumor  was found incidentally on CT scan and that was ordered for recurrent UTIs.  The patient subsequently underwent a TUR-BT  in July 2007.  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 Pathology could not comment on depth of invasion  because of cautery artifact. Post-operatively, the patient developed gross  hematuria, and clot retention, and was admitter to [facility] for  irrigation ( Dr,. Thelma Barge DeChurch). He then stabilized, and was sent home  with a catheter in place. Follow-up cysto showed no more clot, but he had  recurrent tumor. The patient otherwise denies any flank pain.  The patient  currently denies hematuria. He is now for re-turbt, with deep biopsies to  R/O invasion. He has been considered for MitomycinC, but, in view of his  complications, he is considered too high a risk for complications for this  drug instillation.   PAST MEDICAL HISTORY:  1. Hypertension.  2. Kidney stones.   PAST SURGICAL HISTORY:  1. Arthroscopic knee surgery in 2000.  2. Carpal tunnel syndrome, right wrist in 2007.  3. Kidney stone surgery 2004.   CURRENT MEDICATIONS:  Cipro, Diovan, diazepam, Zyrtec, Nasonex, and  multivitamin.   ALLERGIES:  NO KNOWN DRUG ALLERGIES.   FAMILY HISTORY:  Father died of kidney failure at the age of 59.  Mother is  alive and well at the age of 15.   The patient has 1 sibling at 17, who is  alive and well.  The patient's wife is 52 year old alive and well.  There is  a family history of prostate cancer.   SOCIAL HISTORY:  The patient has not smoked for the past 25 years.  He  drinks alcohol occasionally.   REVIEW OF SYSTEMS:  Review of systems is significant for depression,  anxiety.  He does have erectile dysfunction.  He does have dysuria,  frequency, and burning upon urination. Remaining ROS neg.   PHYSICAL EXAMINATION:  VITAL SIGNS:  The patient is afebrile and vital signs  are stable.  GENERAL:  He is in no acute distress.  He is awake, alert, and oriented.  ABDOMEN:  Soft and nontender.  LUNGS:  He has clear breath sounds bilaterally.  He had no CVA tenderness.  GENITOURINARY:  The patient is circumcised.  Penis, urethra nml. Scrotum:  nml. Testes: 4 x 4 cm, nml consistancy  Rectal exam: NST, no fixed mass. Prostate: 3+, benign.  SKIN:  Does not reveal any rashes.  ASSESSMENT:  This is a 65 year old gentleman who presents with bladder  cancer.   PLAN:  1. At this point, the patient will undergo a restaging TURP.  2. Following surgery the patient will be admitted to the hospital and be      observed for the next 2 days.  3. He will be given a voiding trial on post-op day #2 and then probably      sent home.     ______________________________  [name], MD      Sigmund I. Patsi Sears, M.D.  Electronically Signed    SK/MEDQ  D:  09/03/2006  T:  09/03/2006  Job:  [id number]

## 2011-05-15 NOTE — Op Note (Signed)
NAME:  Zachary [name], Zachary [name]                 ACCOUNT NO.:  [id number]   MEDICAL RECORD NO.:  [id number]          PATIENT TYPE:  OIB   LOCATION:  1431                         FACILITY[facility]   PHYSICIAN:  Sigmund I. Patsi Sears, M.D.DATE OF BIRTH:  [birth year]   DATE OF PROCEDURE:  07/26/2006  DATE OF DISCHARGE:                                 OPERATIVE REPORT   PREOPERATIVE DIAGNOSIS:  Right lateral wall bladder cancer.   POSTOPERATIVE DIAGNOSIS:  Bladder neck, right bladder wall, and bladder dome  bladder cancer covering the trigone, and the bladder neck.   OPERATIONS:  Transurethral resection of bladder cancer as above.   OPERATING SURGEON:  Dr. Patsi Sears.   ANESTHESIA:  General LMA.   PREPARATION:  After appropriate preanesthesia, the patient was brought to  the operating room, placed in the operating room dorsal supine position  where general LMA anesthesia was induced.  He was then replaced in dorsal  lithotomy position where the pubis was prepped with Betadine solution and  draped in usual fashion.   REVIEW OF HISTORY:  This 65 year old male, from [place], has a history of  gross painless hematuria, with CT scan showing a 4.4 cm right lateral  bladder wall bladder tumor.  He is now for resection.   PROCEDURE:  Cystourethroscopy revealed trabeculated bladder, but no definite  cellules and no diverticular formation.  The mass of bladder cancer was  identified, which was located at the bladder neck, and covered the trigone,  right lateral wall, and the bladder dome at the level of the bladder neck.  Resection was accomplished from the 6 o'clock to the 12 o'clock position,  and also deep on the right lateral wall with multiple irrigations and  cauterization of bleeding vessels.  After resection, and bleeding was  stopped, it was apparent that the tumor was indeed a right lateral wall  bladder tumor, but was massive in size.  Deep biopsies were taken.  A 24,  three-way Simplastic  catheter was placed, so that traction or irrigation  could be placed and if necessary.  30 mL was placed in the balloon.  The  patient was awakened and taken to recovery room in good condition.      Sigmund I. Patsi Sears, M.D.  Electronically Signed     SIT/MEDQ  D:  07/26/2006  T:  07/26/2006  Job:  [id number]

## 2011-05-15 NOTE — Telephone Encounter (Signed)
Called Abdoul this morning and he called back with the name of the medicine, said it is Chlorzoxazone 500 mg, generic for Parafon [place] dis tab

## 2011-05-15 NOTE — H&P (Signed)
NAME:  Zachary [name], Zachary [name]                 ACCOUNT NO.:  [id number]   MEDICAL RECORD NO.:  [id number]          PATIENT TYPE:  OBV   LOCATION:  A319                          FACILITY:  [facility]   PHYSICIAN:  Hanley Hays. Dechurch, M.D.DATE OF BIRTH:  Feb 11, 1946   DATE OF ADMISSION:  07/29/2006  DATE OF DISCHARGE:  LH                                HISTORY & PHYSICAL   The patient is a 65 year old Caucasian male, followed by Dr. Delbert Harness, who  underwent transurethral resection of a bladder tumor on July 26, 2006, per  Dr. Patsi Sears with biopsy revealing a high-grade papillary urothelial  carcinoma with question of invasion.  He had his Foley catheter removed on  August 1, actually was voiding spontaneously, but awakened this morning,  complaining of pain and inability to void.  He did void some small clots,  but could not pass any significant urine.  He presented to the emergency  room, where Foley catheter was placed.  He was irrigated and multiple clots  were obtained.  As he was getting ready to leave the facility, he actually  had a near-syncopal episode and is being admitted to the hospital for IV  fluids and observation.   PAST MEDICAL HISTORY:  Essentially unremarkable.   MEDICATIONS:  His only medications include Prosed and Cipro.   SOCIAL HISTORY:  He is married, lives with his wife, no alcohol or tobacco  abuse.   REVIEW OF SYSTEMS:  Aside from hematuria and the things associated with his  presentation, are unremarkable.  He had some constipation postoperatively,  but that has resolved.   PHYSICAL EXAMINATION:  GENERAL:  His physical exam reveals a fatigued, well-  developed, well-nourished gentleman, somewhat anxious and sleep-deprived,  but alert and appropriate, who is in no distress at this time.  VITAL SIGNS:  Temperature is 97.1, blood pressure is 118/70, saturation on  room air is 94%.  He is in no distress.  GU:  Foley is draining gross hematuria and small clots.  NECK:   His neck is supple, no JVD.  LUNGS:  Clear to auscultation, anterior and posterior.  HEART:  Regular without murmur.  ABDOMEN:  Soft and nontender.  EXTREMITIES:  Without clubbing or cyanosis.  There is no edema.  NEUROLOGIC EXAM:  Intact.   ASSESSMENT AND PLAN:  Acute urinary retention, secondary to retained clots,  status post transurethral bladder tumor resection.  Consultation has been  placed with Dr. Rito Ehrlich.  The family has already contacted Dr. Patsi Sears.  At this point, the patient is more sleep-deprived than anything, and  anxious.  We will just go ahead  and allow him to rest, follow up his hemoglobin and hematocrit and re-  evaluate.  Once the clots have diminished, may then consider discontinuation  of the catheter, but will defer to urology.  No other changes to his  management at this time.      Hanley Hays Josefine Class, M.D.  Electronically Signed     FED/MEDQ  D:  07/29/2006  T:  07/29/2006  Job:  [id number]

## 2011-05-15 NOTE — Consult Note (Signed)
NAME:  DERWIN, REDDY NO.:  [id number]   MEDICAL RECORD NO.:  [id number]          PATIENT TYPE:  OBV   LOCATION:  A319                          FACILITY:  [facility]   PHYSICIAN:  Dennie Maizes, M.D.   DATE OF BIRTH:  04/17/1946   DATE OF CONSULTATION:  07/29/2006  DATE OF DISCHARGE:                                   CONSULTATION   ATTENDING PHYSICIAN:  Christen Butter, M.D.   CONSULTING PHYSICIAN:  Dennie Maizes, M.D.   REASON FOR CONSULTATION:  Hematuria, clot retention.   CONSULTATION REPORT:  This 65 year old male had intermittent gross hematuria  for a few weeks.  He was seen by Dr. Delbert Harness.  He was evaluated with CT  scan of the abdomen and pelvis.  This revealed a large bladder tumor on the  right lateral wall of the bladder.  The patient was seen by Dr. Patsi Sears.  He has undergone cystoscopy, bladder biopsies, and transverse resection of  bladder tumor on August 26, 2006 at [place].  Postoperatively, the  patient did well and the Foley catheter was removed with some difficulty.  The patient started having voiding difficulty and he was passing a few blood  clots last night.  He came to the Emergency Room at [facility].  A  Foley catheter has been inserted.  I was asked to see the patient for  further evaluation.   PAST MEDICAL HISTORY:  History of recurrent urolithiasis status post ESL  several years ago, status post TUR of bladder tumor in July2007.   MEDICATIONS:  Cipro and Prosed.   ALLERGIES:  None.   EXAMINATION:  ABDOMEN:  Soft.  No palpable flank mass.  No costovertebral  angle tenderness.  No suprapubic tenderness.  Foley catheter is draining  blood-stained urine.  GENITOURINARY:  Testes are normal.  Penis normal.   Irrigation of the bladder was done with sterile water.  Several large blood  clots were removed.  The irrigation became clear after removal of blood  clots.   LABORATORIES:  BUN 13, creatinine 1.  CBC:  WBC 9.9,  hemoglobin 12.9,  hematocrit 37.5.   IMPRESSION:  1.  Hematuria.  2.  Clot retention.  3.  Carcinoma of bladder.  4.  Status post transverse resection of bladder tumor on August 26, 2006.   I reviewed the pathology.  The patient has high-grade papillary urothelial  carcinoma suspicious  foci of invasion of the muscle.   PLAN:  1.  Continue bladder irrigation p.r.n. blood clots and blockage.  2.  If the urine is clear the Foley catheter can be removed tomorrow.  3.  The patient can be discharged in a.m. if he has resolution of the      hematuria.  He will follow up with Dr. Patsi Sears for further treatment.      Thanks for this consult.      Dennie Maizes, M.D.  Electronically Signed     SK/MEDQ  D:  07/29/2006  T:  07/29/2006  Job:  [id number]   cc:   Vonzell Schlatter. Patsi Sears, M.D.  Fax: [phone]

## 2011-05-15 NOTE — Discharge Summary (Signed)
NAME:  Zachary [name], Zachary [name]                 ACCOUNT NO.:  [id number]   MEDICAL RECORD NO.:  [id number]          PATIENT TYPE:  OBV   LOCATION:  A319                          FACILITY:  [facility]   PHYSICIAN:  Hanley Hays. Dechurch, M.D.DATE OF BIRTH:  06/28/1946   DATE OF ADMISSION:  07/29/2006  DATE OF DISCHARGE:  08/03/2007LH                                 DISCHARGE SUMMARY   DISCHARGE DIAGNOSES:  1. Acute urinary retention secondary to retained clot.  2. Status post transurethral resection of bladder tumor on July 30 per Dr.      Patsi Sears, pathology pending.  3. Anxiety.  4. Mild anemia. Hemoglobin 11.8 at the time of discharge.   DISPOSITION:  The patient is discharged to home to resume his normal  activities, followup with Dr. Patsi Sears as scheduled. Followup with Dr.  Janna Arch as needed.   MEDICATIONS:  1. Cipro 500 mg b.i.d. to complete his prescription.  2. Valium 2.5 mg 3 times daily if needed for anxiety or spasm.  3. Multivitamin with minerals 1 daily.  4. Pyridium 1-2 tablets 2 times daily as needed.  5. Zyrtec at bedtime.  6. Tylenol ES 2 p.o. q.6 h p.r.n. pain.   Patient instructed not to take any aspirin and avoid nonsteroidals.   DISPOSITION:  The patient is discharged to home.   HOSPITAL COURSE:  A 65 year old status post transurethral resection of  bladder tumor July 30 per Dr. Patsi Sears which he tolerated well. He had his  Foley catheter removed on the day prior to admission only to awaken later  that night with difficulty voiding and progressive pain. He presented to the  emergency room where a catheter was placed and bladder was irrigated for  multiple clots. He had persistent hematuria. He was seen in consultation by  Dr. Dennie Maizes who had no further suggestions. His urine cleared and the  Foley catheter was discontinued. The patient's voiding spontaneously at the  time of discharge without pain. He is discharged to followup as noted above.  He will continue  his usual medications as noted. No further discussions  regarding his pathology were undertaken during this hospital stay and will  be deferred to his urologist as well as will be the plan of care.   The patient is starting a new position on August 8. It is felt that he  should be able to go to work without difficulty. He is to call should he  have any increasing pain, significant hematuria, fever, chills or any other  questions.      Hanley Hays Josefine Class, M.D.  Electronically Signed     FED/MEDQ  D:  07/30/2006  T:  07/30/2006  Job:  [id number]   cc:   Melvyn Novas, MD  Fax: [phone]   Sigmund I. Patsi Sears, M.D.  Fax: [phone]

## 2011-05-15 NOTE — Op Note (Signed)
NAME:  Zachary [name], Zachary [name]                 ACCOUNT NO.:  [id number]   MEDICAL RECORD NO.:  [id number]          PATIENT TYPE:  OIB   LOCATION:  1428                         FACILITY:  [facility]   PHYSICIAN:  Sigmund I. Patsi Sears, M.D.DATE OF BIRTH:  1946/08/26   DATE OF PROCEDURE:  09/03/2006  DATE OF DISCHARGE:                                 OPERATIVE REPORT   PREOPERATIVE DIAGNOSIS:  Bladder tumor.   POSTOPERATIVE DIAGNOSIS:  Bladder tumor (right bladder wall and anterior  bladder tumor).   PROCEDURES PERFORMED:  TURBT (large).   SURGEON:  Dr. Patsi Sears, MD.   ASSISTANT:  Dr. Marcia Brash, MD.   ANESTHESIA:  General.   SPECIMEN:  Bladder chips.   ESTIMATED BLOOD LOSS:  Minimal.   COMPLICATIONS:  None.   INDICATIONS:  This is a 65 year old gentleman who was seen and evaluated by  Dr. Patsi Sears for a bladder mass that was found on CT scan incidentally for  evaluation of recurrent UTIs.  The patient underwent in July of this year  TURBT that revealed a high-grade papillary urothelial carcinoma without  clear evidence of pathology of muscle invasion.  There were areas that were  suspicious of invasion but that could not be easily discerned due to the  lack of muscle tissue.  The patient presents today for restaging and repeat  TURBT.   DESCRIPTION OF PROCEDURE:  The patient was brought to operating room.  General anesthesia was induced.  Preoperative antibiotics were given.  A  time-out was taken to properly identify the patient and procedure to be  done.  The patient was placed in dorsal lithotomy position. He was prepped  and draped in normal sterile fashion.  A 22-French cystoscope was then used  to access the bladder.  The anterior and posterior urethra were devoid of  any masses, lesions or stones.  The right and left ureter could be  identified urine could be seen effluxing normally.  The bladder was mild to  moderately trabeculated.  The area of the previous resection on the  right  side could be seen.  There were suspicious areas of recurrence on the  previous resection site in the right lateral wall just distal to the right  ureteral orifice.  Upon inspection of the bladder with a 70 degree lens more  tissues were noted on the anterior bladder wall and further extension of the  right wall lesion could be easily identified.  Both lesions together  constituted a large bladder tumor measuring about 5-6 cm.  The resectoscope  was then used after the urethra was dilated with male sounds up to 18-  [place].  The right lateral wall was then resected into the bladder wall  muscle.  The anterior tumor was also resected using the resectoscope making  sure to include some muscular fibers.  Bleeding vessels were then  coagulated.  The inspection of the bladder with the 70 degree lens revealed  adequate resection of the tumors and fulguration of the rest.  There was  evidence of bladder perforation.  Both ureteral orifices were effluxing  urine normally.  The resectoscope  was then taken out and a 22-French  hematuria catheter was then inserted.  The bladder chips were irrigated and  the irrigant after multiple irrigations was clear.  The side port of the  catheter was capped and the catheter was put to drainage.  The patient was  then awakened up from anesthesia and extubated in the OR and taken in stable  condition to PACU.  Please note that Dr. Patsi Sears was present and  participated in the entire procedure as he was the responsible surgeon.     ______________________________  [name], MD      Sigmund I. Patsi Sears, M.D.  Electronically Signed    SK/MEDQ  D:  09/03/2006  T:  09/03/2006  Job:  [id number]

## 2011-05-15 NOTE — Discharge Summary (Signed)
NAME:  AH, BOTT                 ACCOUNT NO.:  [id number]   MEDICAL RECORD NO.:  [id number]          PATIENT TYPE:  INP   LOCATION:  1428                         [facility]ACILITY:  [facility]   PHYSICIAN:  Sigmund I. Patsi Sears, M.D.DATE OF BIRTH:  [birth year]   DATE OF ADMISSION:  09/03/2006  DATE OF DISCHARGE:  09/05/2006                                 DISCHARGE SUMMARY   FINAL DIAGNOSIS:  Transitional cell carcinoma of the bladder.   PROCEDURE:  Cystourethroscopy transurethral resection of large right bladder  wall recurrent bladder tumor, September 03, 2006.   SURGEON:  Dr. Lynelle Smoke I. Tannenbaum.   HISTORY:  Mr. Waas is a 65 year old married white male from [place],  [place], status post TUR of large right bladder wall bladder tumor  approximately 3 weeks ago. The patient had postoperative urinary bleeding  with clot retention requiring catheterization and hospitalization at [facility] postoperatively.  The pathology evaluation from that surgery  shows that the patient has transitional cell carcinoma, but depth of the  invasion cannot be assessed because of cautery artifact. The patient is now  admitted via the operating room for repeat deep bladder biopsies, and TUR of  additional bladder tumor.   PAST MEDICAL HISTORY:  Recurrent urolithiasis, status post ESWL several  years ago.   MEDICATIONS:  1. Cipro.  2. Prevacid.  3. Multivitamins with minerals one per day.  4. Zyrtec at h.s.  5. Tylenol Extra Strength p.r.n. pain.   ALLERGIES:  None.   HABITS:  Tobacco is none, alcohol none.   PHYSICAL EXAMINATION:  As per History and Physical per Dr. Marcia Brash.   HOSPITAL COURSE:  On the day of admission the patient underwent  transurethral resection of recurrent bladder cancer. The cancer is noted to  be across the dome of the bladder, down the right lateral wall, to the right  trigone.  Blue Indigo carmine contrast was seen from the right orifice.   The patient  had Foley catheter replaced at time of surgery, and was removed  on postoperative day number 2. The patient voids clear urine. He is stable  today and is allowed to be discharged.  He will return to the office to  review his pathology report which is not yet available.   PLAN:  Discharge today. 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 He will call if he begins to have  bleeding.      Sigmund I. Patsi Sears, M.D.  Electronically Signed     SIT/MEDQ  D:  09/05/2006  T:  09/05/2006  Job:  [id number]

## 2011-05-18 ENCOUNTER — Other Ambulatory Visit: Payer: Self-pay | Admitting: Orthopedic Surgery

## 2011-05-18 DIAGNOSIS — M542 Cervicalgia: Secondary | ICD-10-CM

## 2011-05-18 MED ORDER — CHLORZOXAZONE 500 MG PO TABS: 500.0000 mg | ORAL_TABLET | Freq: Four times a day (QID) | ORAL | Status: AC | PRN

## 2011-05-18 NOTE — Telephone Encounter (Signed)
Faxed in

## 2011-05-26 ENCOUNTER — Encounter (HOSPITAL_COMMUNITY): Payer: BC Managed Care – PPO | Admitting: Psychiatry

## 2011-05-26 ENCOUNTER — Encounter (INDEPENDENT_AMBULATORY_CARE_PROVIDER_SITE_OTHER): Payer: BC Managed Care – PPO | Admitting: Psychiatry

## 2011-05-26 DIAGNOSIS — F331 Major depressive disorder, recurrent, moderate: Secondary | ICD-10-CM

## 2011-06-03 ENCOUNTER — Encounter (INDEPENDENT_AMBULATORY_CARE_PROVIDER_SITE_OTHER): Payer: BC Managed Care – PPO | Admitting: Psychology

## 2011-06-03 DIAGNOSIS — F332 Major depressive disorder, recurrent severe without psychotic features: Secondary | ICD-10-CM

## 2011-06-03 DIAGNOSIS — F411 Generalized anxiety disorder: Secondary | ICD-10-CM

## 2011-06-09 ENCOUNTER — Ambulatory Visit: Payer: BC Managed Care – PPO | Admitting: Orthopedic Surgery

## 2011-06-09 ENCOUNTER — Other Ambulatory Visit: Payer: Self-pay | Admitting: Neurosurgery

## 2011-06-09 DIAGNOSIS — M542 Cervicalgia: Secondary | ICD-10-CM

## 2011-06-14 ENCOUNTER — Ambulatory Visit
Admission: RE | Admit: 2011-06-14 | Discharge: 2011-06-14 | Disposition: A | Discharge status: Home / Self Care | Payer: BC Managed Care – PPO | Source: Ambulatory Visit | Attending: Neurosurgery | Admitting: Neurosurgery

## 2011-06-14 DIAGNOSIS — M542 Cervicalgia: Secondary | ICD-10-CM

## 2011-06-23 ENCOUNTER — Encounter (HOSPITAL_COMMUNITY): Payer: BC Managed Care – PPO | Admitting: Psychiatry

## 2011-06-29 ENCOUNTER — Observation Stay (HOSPITAL_COMMUNITY)
Admission: EM | Admit: 2011-06-29 | Discharge: 2011-07-01 | DRG: 089 | Disposition: A | Discharge status: Home / Self Care | Payer: BC Managed Care – PPO | Attending: Internal Medicine | Admitting: Internal Medicine

## 2011-06-29 ENCOUNTER — Other Ambulatory Visit (HOSPITAL_COMMUNITY): Payer: BC Managed Care – PPO

## 2011-06-29 ENCOUNTER — Emergency Department (HOSPITAL_COMMUNITY): Payer: BC Managed Care – PPO

## 2011-06-29 DIAGNOSIS — M549 Dorsalgia, unspecified: Secondary | ICD-10-CM | POA: Diagnosis present

## 2011-06-29 DIAGNOSIS — G8929 Other chronic pain: Secondary | ICD-10-CM | POA: Diagnosis present

## 2011-06-29 DIAGNOSIS — F3289 Other specified depressive episodes: Secondary | ICD-10-CM | POA: Diagnosis present

## 2011-06-29 DIAGNOSIS — Z8551 Personal history of malignant neoplasm of bladder: Secondary | ICD-10-CM

## 2011-06-29 DIAGNOSIS — G4733 Obstructive sleep apnea (adult) (pediatric): Secondary | ICD-10-CM | POA: Diagnosis present

## 2011-06-29 DIAGNOSIS — J189 Pneumonia, unspecified organism: Principal | ICD-10-CM | POA: Diagnosis present

## 2011-06-29 DIAGNOSIS — F329 Major depressive disorder, single episode, unspecified: Secondary | ICD-10-CM | POA: Diagnosis present

## 2011-06-29 DIAGNOSIS — E876 Hypokalemia: Secondary | ICD-10-CM | POA: Diagnosis present

## 2011-06-29 LAB — COMPREHENSIVE METABOLIC PANEL
ALT: 29 U/L (ref 0–53)
AST: 21 U/L (ref 0–37)
Alkaline Phosphatase: 97 U/L (ref 39–117)
CO2: 26 mEq/L (ref 19–32)
Calcium: 8.8 mg/dL (ref 8.4–10.5)
Chloride: 99 mEq/L (ref 96–112)
GFR calc non Af Amer: 60 mL/min — ABNORMAL LOW (ref >60)
Glucose, Bld: 129 mg/dL — ABNORMAL HIGH (ref 70–99)
Potassium: 3.4 mEq/L — ABNORMAL LOW (ref 3.5–5.1)
Sodium: 136 mEq/L (ref 135–145)

## 2011-06-29 LAB — CBC
Hemoglobin: 13.6 g/dL (ref 13.0–17.0)
MCH: 29.9 pg (ref 26.0–34.0)
Platelets: 188 10*3/uL (ref 150–400)
RBC: 4.55 MIL/uL (ref 4.22–5.81)
WBC: 9.1 10*3/uL (ref 4.0–10.5)

## 2011-06-29 LAB — URINALYSIS, ROUTINE W REFLEX MICROSCOPIC
Bilirubin Urine: NEGATIVE
Hgb urine dipstick: NEGATIVE
Nitrite: NEGATIVE
Protein, ur: NEGATIVE mg/dL
Specific Gravity, Urine: 1.025 (ref 1.005–1.030)
Urobilinogen, UA: 0.2 mg/dL (ref 0.0–1.0)

## 2011-06-29 LAB — DIFFERENTIAL
Basophils Absolute: 0 10*3/uL (ref 0.0–0.1)
Basophils Relative: 0 % (ref 0–1)
Eosinophils Absolute: 0 10*3/uL (ref 0.0–0.7)
Neutro Abs: 7.1 10*3/uL (ref 1.7–7.7)
Neutrophils Relative %: 78 % — ABNORMAL HIGH (ref 43–77)

## 2011-06-30 LAB — CBC
HCT: 37.2 % — ABNORMAL LOW (ref 39.0–52.0)
Hemoglobin: 12.5 g/dL — ABNORMAL LOW (ref 13.0–17.0)
MCH: 30.5 pg (ref 26.0–34.0)
MCHC: 33.6 g/dL (ref 30.0–36.0)
RDW: 14 % (ref 11.5–15.5)

## 2011-06-30 LAB — DIFFERENTIAL
Basophils Relative: 0 % (ref 0–1)
Eosinophils Relative: 0 % (ref 0–5)
Monocytes Absolute: 0.8 10*3/uL (ref 0.1–1.0)
Monocytes Relative: 9 % (ref 3–12)
Neutro Abs: 6.4 10*3/uL (ref 1.7–7.7)

## 2011-06-30 LAB — BASIC METABOLIC PANEL
BUN: 11 mg/dL (ref 6–23)
Calcium: 8.1 mg/dL — ABNORMAL LOW (ref 8.4–10.5)
GFR calc non Af Amer: >60 mL/min (ref >60)
Glucose, Bld: 110 mg/dL — ABNORMAL HIGH (ref 70–99)
Sodium: 138 mEq/L (ref 135–145)

## 2011-06-30 LAB — URINE CULTURE

## 2011-07-01 LAB — CBC
HCT: 36.9 % — ABNORMAL LOW (ref 39.0–52.0)
Hemoglobin: 12.3 g/dL — ABNORMAL LOW (ref 13.0–17.0)
MCH: 30.4 pg (ref 26.0–34.0)
MCHC: 33.3 g/dL (ref 30.0–36.0)
MCV: 91.1 fL (ref 78.0–100.0)
Platelets: 161 10*3/uL (ref 150–400)
RBC: 4.05 MIL/uL — ABNORMAL LOW (ref 4.22–5.81)
RDW: 13.9 % (ref 11.5–15.5)
WBC: 5.7 10*3/uL (ref 4.0–10.5)

## 2011-07-01 LAB — BASIC METABOLIC PANEL
BUN: 8 mg/dL (ref 6–23)
CO2: 26 mEq/L (ref 19–32)
Calcium: 8.3 mg/dL — ABNORMAL LOW (ref 8.4–10.5)
Chloride: 104 mEq/L (ref 96–112)
Creatinine, Ser: 1.02 mg/dL (ref 0.50–1.35)
GFR calc Af Amer: >60 mL/min (ref >60)
GFR calc non Af Amer: >60 mL/min (ref >60)
Glucose, Bld: 99 mg/dL (ref 70–99)
Potassium: 4.1 mEq/L (ref 3.5–5.1)
Sodium: 136 mEq/L (ref 135–145)

## 2011-07-04 LAB — CULTURE, BLOOD (ROUTINE X 2)

## 2011-07-08 NOTE — H&P (Signed)
NAME:  [name], [name] NO.:  [id number]  MEDICAL RECORD NO.:  [id number]  LOCATION:  [facility]                          FACILITY:  [facility]  PHYSICIAN:  Osvaldo Shipper, MD     DATE OF BIRTH:  Nov 09, 1946  DATE OF ADMISSION:  06/29/2011 DATE OF DISCHARGE:  LH                             HISTORY & PHYSICAL   PRIMARY CARE PHYSICIAN:  [name], MD, however, patient did see Dr. [name], today.  ADMISSION DIAGNOSES: 1. Community-acquired pneumonia. 2. Dehydration. 3. Chronic back pain. 4. History of depression. 5. History of bladder cancer in remission.  CHIEF COMPLAINT:  Cough for the last 3 days.  HISTORY OF PRESENT ILLNESS:  The patient is a 65 year old Caucasian male with a past medical history of bladder cancer, hyperlipidemia, hypertension, chronic back pain who was in his usual state of health until 3 days ago when he started having a cough, and started feeling dizzy. He started having nausea without any emesis.  Denies any chest pain, but has been having shortness of breath.  No fever, but he has been warm, denies any chills.  No leg swelling.  Denies any sick contacts.  He tells me he got a steroid injection to his left knee on last Tuesday.  He also had a cystoscopy last Tuesday done by Dr. Patsi Sears.  The patient tells me his headaches are chronic and is associated with tinnitus and he follows with Dr. Suszanne Conners for the same.  The cough is dry.  MEDICATIONS AT HOME: 1. Diovan/HCT unknown dose daily. 2. Oxycodone/acetaminophen 5 mg as needed, he takes usually once a day     or not even that. 3. Valium 5 mg as needed. 4. Lexapro 10 mg daily. 5. He is on a medication for his history of bladder cancer, he cannot     name it properly. 6. He is also on CPAP at while sleeping.  ALLERGIES:  SULFA medication which causes itching and welts.  PAST MEDICAL HISTORY:  Positive for chronic back pain, chronic neck pain, history of bladder cancer in remission, history of  depression, hypertension, hyperlipidemia.  PAST SURGICAL HISTORY:  Back surgery x2 in the past, bladder cancer surgery.  He has had ACL repair in his left knee, has had carpal tunnel in his right hand.  SOCIAL HISTORY:  He lives in [place] with his wife.  He is on disability.  He denies any smoking alcohol or illicit drug use.  FAMILY HISTORY:  Positive for psychiatric illness in his mother along with hypertension.  Father has hypertension.  Sister has breast cancer, hypertension.  REVIEW OF SYSTEMS:  GENERAL SYSTEM:  Positive for weakness, malaise. HEENT:  Unremarkable.  CARDIOVASCULAR:  Unremarkable.  RESPIRATORY:  As in HPI.  GI:  Unremarkable.  GU:  Unremarkable.  NEUROLOGIC: Unremarkable.  PSYCHIATRIC:  Unremarkable.  RHEUMATOLOGIC: Unremarkable.  Other systems reviewed and found to be negative.  The patient did mention diarrhea about 6-7 episodes over the course of the day today.  Denies any recent antibiotic use.  PHYSICAL EXAMINATION:  VITAL SIGNS:  Temperature 99.7, blood pressure 132/66, heart rate 104, when he was ambulated it went up to 130s, respiratory rate 24, saturation  92-93% on room air. GENERAL:  He is an obese white male in no distress. HEENT:  Head is normocephalic, atraumatic.  Pupils equal and reacting. No pallor, no icterus.  Oral mucous membranes are dry.  No oral lesions are noted.  Neck is soft and supple.  No thyromegaly is appreciated. LUNGS:  Reveal crackles on the left base.  No wheezing is present. CARDIOVASCULAR SYSTEM:  Normal regular.  No S3, S4, rubs, murmurs, or bruits. ABDOMEN:  Soft, nontender, nondistended.  Bowel sounds are present.  No masses or organomegaly is appreciated. GU:  Deferred. MUSCULOSKELETAL:  Normal muscle mass and tone. NEUROLOGIC:  He is alert and oriented x3.  No focal neurologic deficit is present. SKIN:  Does not reveal any rashes.  LABORATORY DATA:  His white cell count is 9.1, hemoglobin is 13.6, platelet count  is 188.  Sodium is 136, potassium is , chloride is 99, bicarb is 26, glucose is 129, BUN is 13, creatinine is 1.22.  His total bilirubin is 0.2, alk phos is 97, AST is 21, ALT is 29, total protein 7.5, albumin is 3.3, calcium is 8.8.  His UA is unremarkable.  His blood cultures are pending.  They have been done in the ED.  IMAGING STUDIES:  He had a chest x-ray which showed mild retrocardiac/left lower lobe opacity atelectasis versus pneumonia.  ASSESSMENT:  This is a 65 year old Caucasian male with a past medical history as stated earlier who presents with cough for the last 3 days and is found to have pneumonia in the left side.  This is categorized as community-acquired pneumonia.  He also became tachycardic when he was ambulated probably from dehydration as he did have multiple episodes of diarrhea today. 1. Community-acquired pneumonia will be treated with ceftriaxone and     azithromycin.  Oxygen will be provided as needed. 2. He has a history of sleep apnea.  Continue with continuous positive     airway pressure. 3. Dehydration due to diarrhea, he will be given IV fluids.  Reason     for the diarrhea is not entirely clear.  We will give him Imodium     as needed and if diarrhea becomes worse, further testing may be     required. 4. History of hypertension.  We will hold off on his antihypertensive     agents for tonight and once we get his dosage correctly, we can     resume it tomorrow. 5. Chronic back pain.  Continue with oxycodone. 6. History of tinnitus.  Continue with Valium. 7. History of depression.  Continue with Lexapro.  Further management decisions will depend on results of further testing and patient's response to treatment.     Osvaldo Shipper, MD     GK/MEDQ  D:  06/29/2011  T:  06/29/2011  Job:  [id number]  cc:   [name], M.D. Fax: [phone]  Madelin Rear. [name], MD Fax: [phone]  Electronically Signed by Osvaldo Shipper MD on 07/08/2011 06:44:58 AM

## 2011-07-15 NOTE — Discharge Summary (Signed)
  NAMEMOLLY, Zachary [name] NO.:  [id number]  MEDICAL RECORD NO.:  [id number]  LOCATION:  A324                          FACILITY:  [facility]  PHYSICIAN:  Peggye Pitt, M.D. DATE OF BIRTH:  [birth year]  DATE OF ADMISSION:  06/29/2011 DATE OF DISCHARGE:  07/04/2012LH                              DISCHARGE SUMMARY   PRIMARY CARE PHYSICIAN:  [name], MD  DISCHARGE DIAGNOSES: 1. Community-acquired pneumonia, improved. 2. Hypokalemia, repleted. 3. Obstructive sleep apnea, on nightly CPAP. 4. History of bladder cancer in remission. 5. History of depression. 6. Chronic back pain.  DISCHARGE MEDICATIONS: 1. Avelox 400 mg daily for 7 days. 2. Diazepam 5 mg every 6 hours as needed as previously prescribed. 3. Diovan and hydrochlorothiazide 320/12.5 mg 1 tablet daily. 4. Lexapro 10 mg daily. 5. Oxycodone 5 mg daily as needed for pain as per previously     prescribed.  CONSULTATION THIS HOSPITALIZATION:  None.  DISPOSITION AND FOLLOWUP:  Zachary [name] will be discharged home today in stable condition.  Instructed to follow up with Dr. Margo Aye in 2 weeks for a regular hospital followup appointment.  IMAGES AND PROCEDURES THIS HOSPITALIZATION:  A chest x-ray on July 2 that showed a left lower lobe opacity concerning for pneumonia.  HISTORY AND PHYSICAL:  For full details, please see dictation on July 2 by Dr. Rito Ehrlich, but in brief, Zachary [name] is a pleasant 65 year old Caucasian gentleman with known history of depression and transitional bladder cancer in remission who presented to the hospital with complaints of cough for 3 days.  He was noted to have a left lower lobe pneumonia and because of this we were asked to admit him for further evaluation.  HOSPITAL COURSE BY PROBLEM: 1. Community-acquired pneumonia.  He was started on Rocephin and     azithromycin.  He has not had any fevers or leukocytosis.  He is     not having any nausea or vomiting, so I believe he can tolerate  a     p.o. regimen just fine at home.  He is not requiring any oxygen.     For all these issues, he is stable for discharge home today to     complete a 7-day course of Avelox and will follow up with his PCP. 2. Rest of chronic conditions are stable. 3. Vitals on day of discharge; blood pressure 120/74, heart rate 72,     respirations 18, sats of 95% on room air and temperature of 98.2.     Peggye Pitt, M.D.     EH/MEDQ  D:  07/01/2011  T:  07/01/2011  Job:  [id number]  cc:   [name], M.D. Fax: [phone]  Electronically Signed by Peggye Pitt M.D. on 07/15/2011 07:38:56 AM

## 2011-07-21 ENCOUNTER — Encounter (HOSPITAL_COMMUNITY): Payer: BC Managed Care – PPO | Admitting: Psychiatry

## 2011-08-03 ENCOUNTER — Other Ambulatory Visit: Payer: Self-pay | Admitting: Sports Medicine

## 2011-08-03 DIAGNOSIS — M25562 Pain in left knee: Secondary | ICD-10-CM

## 2011-08-04 ENCOUNTER — Encounter (INDEPENDENT_AMBULATORY_CARE_PROVIDER_SITE_OTHER): Payer: BC Managed Care – PPO | Admitting: Psychiatry

## 2011-08-04 DIAGNOSIS — F331 Major depressive disorder, recurrent, moderate: Secondary | ICD-10-CM

## 2011-08-06 ENCOUNTER — Ambulatory Visit
Admission: RE | Admit: 2011-08-06 | Discharge: 2011-08-06 | Disposition: A | Discharge status: Home / Self Care | Payer: BC Managed Care – PPO | Source: Ambulatory Visit | Attending: Sports Medicine | Admitting: Sports Medicine

## 2011-08-06 DIAGNOSIS — M25562 Pain in left knee: Secondary | ICD-10-CM

## 2011-08-13 ENCOUNTER — Ambulatory Visit (INDEPENDENT_AMBULATORY_CARE_PROVIDER_SITE_OTHER): Payer: BC Managed Care – PPO | Admitting: Otolaryngology

## 2011-08-27 ENCOUNTER — Encounter (HOSPITAL_BASED_OUTPATIENT_CLINIC_OR_DEPARTMENT_OTHER)
Admission: RE | Admit: 2011-08-27 | Discharge: 2011-08-27 | Disposition: A | Discharge status: Home / Self Care | Payer: BC Managed Care – PPO | Source: Ambulatory Visit | Attending: Orthopedic Surgery | Admitting: Orthopedic Surgery

## 2011-08-27 DIAGNOSIS — Z01812 Encounter for preprocedural laboratory examination: Secondary | ICD-10-CM | POA: Insufficient documentation

## 2011-08-27 DIAGNOSIS — Z01818 Encounter for other preprocedural examination: Secondary | ICD-10-CM | POA: Insufficient documentation

## 2011-08-27 DIAGNOSIS — Z01810 Encounter for preprocedural cardiovascular examination: Secondary | ICD-10-CM | POA: Insufficient documentation

## 2011-08-27 LAB — BASIC METABOLIC PANEL
BUN: 18 mg/dL (ref 6–23)
CO2: 29 mEq/L (ref 19–32)
Chloride: 102 mEq/L (ref 96–112)
Creatinine, Ser: 1.07 mg/dL (ref 0.50–1.35)

## 2011-09-01 ENCOUNTER — Ambulatory Visit (HOSPITAL_BASED_OUTPATIENT_CLINIC_OR_DEPARTMENT_OTHER)
Admission: RE | Admit: 2011-09-01 | Payer: BC Managed Care – PPO | Source: Ambulatory Visit | Admitting: Orthopedic Surgery

## 2011-09-10 ENCOUNTER — Emergency Department (HOSPITAL_COMMUNITY): Payer: BC Managed Care – PPO

## 2011-09-10 ENCOUNTER — Other Ambulatory Visit: Payer: Self-pay

## 2011-09-10 ENCOUNTER — Encounter (HOSPITAL_COMMUNITY): Payer: Self-pay | Admitting: Emergency Medicine

## 2011-09-10 ENCOUNTER — Emergency Department (HOSPITAL_COMMUNITY)
Admission: EM | Admit: 2011-09-10 | Discharge: 2011-09-10 | Disposition: A | Discharge status: Home / Self Care | Payer: BC Managed Care – PPO | Attending: Emergency Medicine | Admitting: Emergency Medicine

## 2011-09-10 DIAGNOSIS — I10 Essential (primary) hypertension: Secondary | ICD-10-CM | POA: Insufficient documentation

## 2011-09-10 DIAGNOSIS — Z8551 Personal history of malignant neoplasm of bladder: Secondary | ICD-10-CM | POA: Insufficient documentation

## 2011-09-10 DIAGNOSIS — G4733 Obstructive sleep apnea (adult) (pediatric): Secondary | ICD-10-CM | POA: Insufficient documentation

## 2011-09-10 DIAGNOSIS — F341 Dysthymic disorder: Secondary | ICD-10-CM | POA: Insufficient documentation

## 2011-09-10 DIAGNOSIS — R531 Weakness: Secondary | ICD-10-CM

## 2011-09-10 DIAGNOSIS — R5383 Other fatigue: Secondary | ICD-10-CM | POA: Insufficient documentation

## 2011-09-10 DIAGNOSIS — R5381 Other malaise: Secondary | ICD-10-CM | POA: Insufficient documentation

## 2011-09-10 DIAGNOSIS — Z87442 Personal history of urinary calculi: Secondary | ICD-10-CM | POA: Insufficient documentation

## 2011-09-10 HISTORY — DX: Depression, unspecified: F32.A

## 2011-09-10 HISTORY — DX: Major depressive disorder, single episode, unspecified: F32.9

## 2011-09-10 HISTORY — DX: Obstructive sleep apnea (adult) (pediatric): G47.33

## 2011-09-10 HISTORY — DX: Dependence on other enabling machines and devices: Z99.89

## 2011-09-10 LAB — URINALYSIS, ROUTINE W REFLEX MICROSCOPIC
Glucose, UA: NEGATIVE mg/dL
Hgb urine dipstick: NEGATIVE
Leukocytes, UA: NEGATIVE
pH: 5.5 (ref 5.0–8.0)

## 2011-09-10 LAB — COMPREHENSIVE METABOLIC PANEL
AST: 18 U/L (ref 0–37)
BUN: 13 mg/dL (ref 6–23)
CO2: 26 mEq/L (ref 19–32)
Chloride: 104 mEq/L (ref 96–112)
Creatinine, Ser: 0.94 mg/dL (ref 0.50–1.35)
GFR calc non Af Amer: >60 mL/min (ref >60)
Glucose, Bld: 87 mg/dL (ref 70–99)
Total Bilirubin: 0.4 mg/dL (ref 0.3–1.2)

## 2011-09-10 LAB — DIFFERENTIAL
Lymphocytes Relative: 32 % (ref 12–46)
Lymphs Abs: 1.9 10*3/uL (ref 0.7–4.0)
Monocytes Absolute: 0.4 10*3/uL (ref 0.1–1.0)
Monocytes Relative: 7 % (ref 3–12)
Neutro Abs: 3.6 10*3/uL (ref 1.7–7.7)

## 2011-09-10 LAB — LIPASE, BLOOD: Lipase: 45 U/L (ref 11–59)

## 2011-09-10 LAB — CBC
HCT: 41.6 % (ref 39.0–52.0)
Hemoglobin: 13.9 g/dL (ref 13.0–17.0)
WBC: 5.9 10*3/uL (ref 4.0–10.5)

## 2011-09-10 LAB — LACTIC ACID, PLASMA: Lactic Acid, Venous: 0.8 mmol/L (ref 0.5–2.2)

## 2011-09-10 MED ORDER — SODIUM CHLORIDE 0.9 % IV SOLN
INTRAVENOUS | Status: DC
  Administered 2011-09-10: 18:00:00 via INTRAVENOUS

## 2011-09-10 MED ORDER — POTASSIUM CHLORIDE 20 MEQ PO PACK
40.0000 meq | PACK | Freq: Once | ORAL | Status: AC
  Administered 2011-09-10: 40 meq via ORAL
  Filled 2011-09-10: qty 1

## 2011-09-10 NOTE — ED Notes (Signed)
Tolerated 8oz gingerale w/o n/v

## 2011-09-10 NOTE — ED Notes (Signed)
Pt left er stating no needs 

## 2011-09-10 NOTE — ED Notes (Signed)
C/o dizziness and generalized weakness x 2 weeks; states was seen at Urgent Care and tx for pnuemonia; reports not feeling any better, and feeling gradually worse since that time; reports nights sweats; denies productive cough or bloody sputum; states was seen by Dr. Margo Aye yesterday for same and had "extensive" blood work done.  A&ox4; answers questions appropriately; per wife, who spoke with me privately, pt has hx of depression and she says he seems more depressed and she thinks this may be contributing to his s/s.

## 2011-09-10 NOTE — ED Notes (Signed)
Pt c/o vomiting, nausea and weakness a month. Pt has seen PCP for the same and nothing was found. Pt was sent over by dr hall.

## 2011-09-10 NOTE — ED Provider Notes (Signed)
History     CSN: [id number] Arrival date & time: 09/10/2011  1:59 PM  Scribed for [name], DO, the patient was seen in room [facility]03/[facility]. This chart was scribed by [name]. This patient's care was started at 3:41PM.   Chief Complaint  Patient presents with  . Weakness  . Nausea  . Emesis     HPI Dr. Clarene Duke entered patient room in 3:41PM [name] is a 65 y.o. male who presents to the Emergency Department complaining of gradual onset and persistence of constant generalized weakness and "just not feeling right" x1 month.  Has been assoc with intermittent episodes of dry heaves and diaphoresis that began a several weeks ago.  Patient's wife supplemented the history.  Pt has been eval by Urgent Care x2 and his PMD x1 for same "and they didn't find anything on all the tests they did."  Endorses being complaint with his meds and wearing his CPAP qhs.  Denies CP/palpitations, no SOB/cough, no abd pain, no diarrhea, no fevers, no headache, no visual changes, no focal motor weakness, no tingling/numbness in extremities, no rash.   PCP Dr. Margo Aye  PAST MEDICAL HISTORY:  Past Medical History  Diagnosis Date  . Back pain   . Hypertension   . Anxiety   . Bladder cancer   . Depression   . Kidney stone   . Bladder cancer   . Chronic headache   . Tinnitus   . Obstructive sleep apnea on CPAP     PAST SURGICAL HISTORY:  Past Surgical History  Procedure Date  . Back  l4-5   . Bladder cancer   . Knee surgery   . Carpal tunnel release     MEDICATIONS:  Previous Medications   BETHANECHOL (URECHOLINE) 5 MG TABLET    Take 5 mg by mouth 3 (three) times daily.     DIAZEPAM (VALIUM) 5 MG TABLET    Take 5 mg by mouth every 6 (six) hours as needed.     DICLOFENAC (CATAFLAM) 50 MG TABLET    Take 50 mg by mouth 2 (two) times daily.     ESCITALOPRAM (LEXAPRO) 20 MG TABLET    Take 20 mg by mouth daily.     OXYCODONE-ACETAMINOPHEN (PERCOCET) 10-325 MG PER TABLET    Take 1 tablet  by mouth every 4 (four) hours as needed.     VALSARTAN-HYDROCHLOROTHIAZIDE (DIOVAN-HCT) 320-12.5 MG PER TABLET    Take 1 tablet by mouth daily.       ALLERGIES:  Allergies as of 09/10/2011 - Review Complete 09/10/2011  Allergen Reaction Noted  . Sulfonamide derivatives      SOCIAL HISTORY: History   Social History  . Marital Status: Married    Spouse Name: N/A    Number of Children: N/A  . Years of Education: 12th    Occupational History  . retired    Social History Main Topics  . Smoking status: Never Smoker   . Smokeless tobacco: None  . Alcohol Use: No  . Drug Use: No  . Sexually Active: None   Other Topics Concern  . None   Social History Narrative  . None    Review of Systems ROS: Statement: All systems negative except as marked or noted in the HPI; Constitutional: Negative for fever and chills. ; ; Eyes: Negative for eye pain, redness and discharge. ; ; ENMT: Negative for ear pain, hoarseness, nasal congestion, sinus pressure and sore throat. ; ; Cardiovascular: Negative for chest pain, palpitations,  diaphoresis, dyspnea and peripheral edema. ; ; Respiratory: Negative for cough, wheezing and stridor. ; ; Gastrointestinal: +nausea, Negative for vomiting, diarrhea and abdominal pain, blood in stool, hematemesis, jaundice and rectal bleeding. . ; ; Genitourinary: Negative for dysuria, flank pain and hematuria. ; ; Musculoskeletal: Negative for back pain and neck pain. Negative for swelling and trauma.; ; Skin: Negative for pruritus, rash, abrasions, blisters, bruising and skin lesion.; ; Neuro: Negative for headache, lightheadedness and neck stiffness. +generalized weakness/fatigue.  Negative for altered level of consciousness , altered mental status, extremity weakness, paresthesias, involuntary movement, seizure and syncope.     Physical Exam    BP 132/74  Pulse 61  Temp(Src) 98.3 F (36.8 C) (Oral)  Resp 20  Ht 6' (1.829 m)  Wt 235 lb (106.595 kg)  BMI 31.87  kg/m2  SpO2 95%   Physical Exam 1545: Physical examination:  Nursing notes reviewed; Vital signs and O2 SAT reviewed;  Constitutional: Well developed, Well nourished, In no acute distress; Head:  Normocephalic, atraumatic; Eyes: EOMI, PERRL, No scleral icterus; ENMT: Mouth and pharynx normal, Mucous membranes dry; Neck: Supple, Full range of motion, No lymphadenopathy; Cardiovascular: Regular rate and rhythm, No murmur, rub, or gallop; Respiratory: Breath sounds clear & equal bilaterally, No rales, rhonchi, wheezes, or rub, Normal respiratory effort/excursion; Chest: Nontender, Movement normal; Abdomen: Soft, Nontender, Nondistended, Normal bowel sounds; Extremities: Pulses normal, No tenderness, No edema, No calf edema or asymmetry.; Neuro: AA&Ox3, Major CN grossly intact. Speech clear, no facial droop. No gross focal motor or sensory deficits in extremities.; Skin: Color normal, Warm, Dry.  Psych:  Affect flat, poor eye contact.   ED Course  Procedures   MDM MDM Reviewed: nursing note, vitals and previous chart Reviewed previous: labs Interpretation: labs, ECG and x-ray    Date: 09/10/2011  Rate: 62  Rhythm: normal sinus rhythm  QRS Axis: normal  Intervals: normal  ST/T Wave abnormalities: normal  Conduction Disutrbances:right bundle branch block  Narrative Interpretation:   Old EKG Reviewed: unchanged, from previous EKG dated 08/27/2011.  Results for orders placed during the hospital encounter of 09/10/11  URINALYSIS, ROUTINE W REFLEX MICROSCOPIC      Component Value Range   Color, Urine YELLOW  YELLOW    Appearance CLEAR  CLEAR    Specific Gravity, Urine 1.025  1.005 - 1.030    pH 5.5  5.0 - 8.0    Glucose, UA NEGATIVE  NEGATIVE (mg/dL)   Hgb urine dipstick NEGATIVE  NEGATIVE    Bilirubin Urine NEGATIVE  NEGATIVE    Ketones, ur NEGATIVE  NEGATIVE (mg/dL)   Protein, ur NEGATIVE  NEGATIVE (mg/dL)   Urobilinogen, UA 0.2  0.0 - 1.0 (mg/dL)   Nitrite NEGATIVE  NEGATIVE     Leukocytes, UA NEGATIVE  NEGATIVE   CBC      Component Value Range   WBC 5.9  4.0 - 10.5 (K/uL)   RBC 4.65  4.22 - 5.81 (MIL/uL)   Hemoglobin 13.9  13.0 - 17.0 (g/dL)   HCT 16.1  09.6 - 04.5 (%)   MCV 89.5  78.0 - 100.0 (fL)   MCH 29.9  26.0 - 34.0 (pg)   MCHC 33.4  30.0 - 36.0 (g/dL)   RDW 40.9  81.1 - 91.4 (%)   Platelets 216  150 - 400 (K/uL)  DIFFERENTIAL      Component Value Range   Neutrophils Relative 61  43 - 77 (%)   Neutro Abs 3.6  1.7 - 7.7 (K/uL)   Lymphocytes  Relative 32  12 - 46 (%)   Lymphs Abs 1.9  0.7 - 4.0 (K/uL)   Monocytes Relative 7  3 - 12 (%)   Monocytes Absolute 0.4  0.1 - 1.0 (K/uL)   Eosinophils Relative 0  0 - 5 (%)   Eosinophils Absolute 0.0  0.0 - 0.7 (K/uL)   Basophils Relative 0  0 - 1 (%)   Basophils Absolute 0.0  0.0 - 0.1 (K/uL)  COMPREHENSIVE METABOLIC PANEL      Component Value Range   Sodium 141  135 - 145 (mEq/L)   Potassium 3.4 (*) 3.5 - 5.1 (mEq/L)   Chloride 104  96 - 112 (mEq/L)   CO2 26  19 - 32 (mEq/L)   Glucose, Bld 87  70 - 99 (mg/dL)   BUN 13  6 - 23 (mg/dL)   Creatinine, Ser 9.14  0.50 - 1.35 (mg/dL)   Calcium 8.4  8.4 - 78.2 (mg/dL)   Total Protein 6.8  6.0 - 8.3 (g/dL)   Albumin 3.7  3.5 - 5.2 (g/dL)   AST 18  0 - 37 (U/L)   ALT 22  0 - 53 (U/L)   Alkaline Phosphatase 92  39 - 117 (U/L)   Total Bilirubin 0.4  0.3 - 1.2 (mg/dL)   GFR calc non Af Amer >60  >60 (mL/min)   GFR calc Af Amer >60  >60 (mL/min)  LIPASE, BLOOD      Component Value Range   Lipase 45  11 - 59 (U/L)  LACTIC ACID, PLASMA      Component Value Range   Lactic Acid, Venous 0.8  0.5 - 2.2 (mmol/L)  POCT I-STAT TROPONIN I      Component Value Range   Troponin i, poc 0.01  0.00 - 0.08 (ng/mL)   Comment 3            Dg Chest 2 View  09/10/2011  *RADIOLOGY REPORT*  Clinical Data: Weakness with night sweats.  Feeling poorly for 2 weeks.  History of pneumonia 2 months ago.  Nonsmoker  CHEST - 2 VIEW  Comparison: 09/01/2011 and 06/29/2011  Findings: Heart  and mediastinal contours are within normal limits. The lung fields appear clear with no signs of focal infiltrate or congestive failure. Stable left lower lobe scarring/atelectasis is seen.  The lung fields are otherwise clear with no signs of focal infiltrate or congestive failure.  No pleural fluid or significant peribronchial cuffing is noted.  Bony structures demonstrate degenerative osteophytosis of the thoracic spine.  IMPRESSION: Stable cardiopulmonary appearance with no new focal or acute abnormality suggested.  Original Report Authenticated By: Bertha Stakes, M.D.   5:40 PM:  Potassium repleted PO.  Pt has tol PO well in ED without N/V.  Afebrile with stable VS.  Not orthostatic.  No criteria to admit at this time.  Affect flat, poor eye contact.  Pt queried regarding this.  Endorses because of his symptoms for the past month his knee replacement surgery was postponed, which "might be making me a little depressed."  Denies SI.  Endorses he does have an outpt theraptist he can make an appt with.  Pt and his wife both want to go home now.  Dx testing d/w pt and family.  Questions answered.  Verb understanding, agreeable to d/c home with outpt f/u.      I personally performed the services described in this documentation, which was scribed in my presence. The recorded information has been reviewed and considered. Adolph Clutter  M   Coraima Tibbs M         [name], DO 09/11/11 1114

## 2011-09-10 NOTE — ED Notes (Signed)
Given gingerale 8 oz for po fluid challenge

## 2011-09-11 LAB — URINE CULTURE: Culture  Setup Time: 201209140128

## 2011-09-17 LAB — URINALYSIS, ROUTINE W REFLEX MICROSCOPIC
Glucose, UA: NEGATIVE
Protein, ur: NEGATIVE

## 2011-09-17 LAB — URINE MICROSCOPIC-ADD ON

## 2011-09-21 ENCOUNTER — Ambulatory Visit (HOSPITAL_BASED_OUTPATIENT_CLINIC_OR_DEPARTMENT_OTHER)
Admission: RE | Admit: 2011-09-21 | Discharge: 2011-09-21 | Disposition: A | Discharge status: Home / Self Care | Payer: BC Managed Care – PPO | Source: Ambulatory Visit | Attending: Orthopedic Surgery | Admitting: Orthopedic Surgery

## 2011-09-21 DIAGNOSIS — M659 Unspecified synovitis and tenosynovitis, unspecified site: Secondary | ICD-10-CM | POA: Insufficient documentation

## 2011-09-21 DIAGNOSIS — M23329 Other meniscus derangements, posterior horn of medial meniscus, unspecified knee: Secondary | ICD-10-CM | POA: Insufficient documentation

## 2011-09-21 DIAGNOSIS — E669 Obesity, unspecified: Secondary | ICD-10-CM | POA: Insufficient documentation

## 2011-09-21 DIAGNOSIS — G43909 Migraine, unspecified, not intractable, without status migrainosus: Secondary | ICD-10-CM | POA: Insufficient documentation

## 2011-09-21 DIAGNOSIS — M2240 Chondromalacia patellae, unspecified knee: Secondary | ICD-10-CM | POA: Insufficient documentation

## 2011-09-21 DIAGNOSIS — I10 Essential (primary) hypertension: Secondary | ICD-10-CM | POA: Insufficient documentation

## 2011-09-21 DIAGNOSIS — F3289 Other specified depressive episodes: Secondary | ICD-10-CM | POA: Insufficient documentation

## 2011-09-21 DIAGNOSIS — M129 Arthropathy, unspecified: Secondary | ICD-10-CM | POA: Insufficient documentation

## 2011-09-21 DIAGNOSIS — Z01812 Encounter for preprocedural laboratory examination: Secondary | ICD-10-CM | POA: Insufficient documentation

## 2011-09-21 DIAGNOSIS — G4733 Obstructive sleep apnea (adult) (pediatric): Secondary | ICD-10-CM | POA: Insufficient documentation

## 2011-09-21 DIAGNOSIS — M23349 Other meniscus derangements, anterior horn of lateral meniscus, unspecified knee: Secondary | ICD-10-CM | POA: Insufficient documentation

## 2011-09-21 DIAGNOSIS — F329 Major depressive disorder, single episode, unspecified: Secondary | ICD-10-CM | POA: Insufficient documentation

## 2011-09-21 DIAGNOSIS — F411 Generalized anxiety disorder: Secondary | ICD-10-CM | POA: Insufficient documentation

## 2011-09-22 LAB — POCT HEMOGLOBIN-HEMACUE: Hemoglobin: 14 g/dL (ref 13.0–17.0)

## 2011-09-22 NOTE — Op Note (Signed)
NAMEJAMANI, BEARCE NO.:  [id number]  MEDICAL RECORD NO.:  [id number]  LOCATION:                                 FACILITY:  PHYSICIAN:  Elana Alm. Thurston Hole, M.D. DATE OF BIRTH:  March 18, 1946  DATE OF PROCEDURE:  09/21/2011 DATE OF DISCHARGE:                              OPERATIVE REPORT   PREOPERATIVE DIAGNOSIS:  Left knee medial and lateral meniscal tears with chondromalacia and synovitis.  POSTOPERATIVE DIAGNOSIS:  Left knee medial and lateral meniscal tears with chondromalacia and synovitis.  PROCEDURES: 1. Left knee examination under anesthesia followed by arthroscopic     partial medial and lateral meniscectomies. 2. Left knee chondroplasty with partial synovectomy.  SURGEON:  Elana Alm. Thurston Hole, MD  ASSISTANT:  Julien Girt, PA-C  ANESTHESIA:  General.  OPERATIVE TIME:  30 minutes.  COMPLICATIONS:  None.  INDICATIONS FOR PROCEDURE:  Mr. Hemmelgarn is a 65 year old gentleman who has had 8-9 months of increasing left knee pain with exam and MRI documenting meniscal tearing with chondromalacia and synovitis.  He has failed conservative care and is now to undergo arthroscopy.  DESCRIPTION OF PROCEDURE:  Mr. Ballantine was brought to the operating room on September 21, 2011.  He was placed on the operative table in supine position.  After being placed under general anesthesia, his left knee was examined.  Range of motion from -5 to 125 degrees, knee stable to ligamentous exam with normal patellar tracking.  Left knee was sterilely injected with 0.25% Marcaine with epinephrine.  Left leg was then prepped using sterile DuraPrep and draped using sterile technique.  Time- out procedure was called and the correct left knee identified. Initially through an anterolateral portal, the arthroscope with a pump attached.  This was placed into an anteromedial portal and arthroscopic probe was placed.  On initial inspection, medial compartment was found to have 75%  grade 3 chondromalacia which was debrided.  He had a complex tear of the posterior and medial horn of the medial meniscus of which 50% was resected back to a stable rim.  Intercondylar notch inspected. Anterior and posterior cruciate ligaments were normal.  Lateral compartment was inspected.  He had 30% grade 3 chondromalacia which was debrided.  Lateral meniscus showed partial tearing in anterior horn of 25% which was resected back to stable rim.  Patellofemoral joint showed 75% grade 3 chondromalacia on the patellofemoral groove and this was debrided.  The patella tracked normally.  There was moderate synovitis in medial and lateral gutters which was debrided, otherwise this was free of pathology.  After this was done, it was felt that all pathology had been satisfactorily addressed.  The instruments were removed. Portals were closed with 3-0 nylon suture.  Sterile dressings were applied.  The patient awakened and taken to the recovery room in stable condition.  FOLLOWUP CARE:  Mr. Morales will be followed as an outpatient on Norco for pain.  He will be seen back in the office in a week for sutures out and followup.     Tishawna Larouche A. Thurston Hole, M.D.     RAW/MEDQ  D:  09/21/2011  T:  09/21/2011  Job:  [id number]  Electronically Signed by  Salvatore Marvel M.D. on 09/22/2011 07:48:06 AM

## 2011-09-28 ENCOUNTER — Encounter (HOSPITAL_COMMUNITY): Payer: BC Managed Care – PPO | Admitting: Psychology

## 2011-09-29 ENCOUNTER — Encounter (INDEPENDENT_AMBULATORY_CARE_PROVIDER_SITE_OTHER): Payer: BC Managed Care – PPO | Admitting: Psychiatry

## 2011-09-29 DIAGNOSIS — F331 Major depressive disorder, recurrent, moderate: Secondary | ICD-10-CM

## 2011-10-02 LAB — POCT I-STAT 4, (NA,K, GLUC, HGB,HCT)
Glucose, Bld: 118 — ABNORMAL HIGH
Operator id: 268271

## 2011-10-05 ENCOUNTER — Encounter (INDEPENDENT_AMBULATORY_CARE_PROVIDER_SITE_OTHER): Payer: BC Managed Care – PPO | Admitting: Psychology

## 2011-10-05 DIAGNOSIS — F332 Major depressive disorder, recurrent severe without psychotic features: Secondary | ICD-10-CM

## 2011-10-05 DIAGNOSIS — F411 Generalized anxiety disorder: Secondary | ICD-10-CM

## 2011-10-09 LAB — POCT I-STAT 4, (NA,K, GLUC, HGB,HCT)
Glucose, Bld: 93
HCT: 40
Hemoglobin: 13.6
Potassium: 3.6
Sodium: 142

## 2011-10-12 LAB — I-STAT 8, (EC8 V) (CONVERTED LAB)
Acid-Base Excess: 7 — ABNORMAL HIGH
Bicarbonate: 30.3 — ABNORMAL HIGH
TCO2: 31
pCO2, Ven: 38.1 — ABNORMAL LOW
pH, Ven: 7.508 — ABNORMAL HIGH

## 2011-10-19 ENCOUNTER — Encounter (INDEPENDENT_AMBULATORY_CARE_PROVIDER_SITE_OTHER): Payer: BC Managed Care – PPO | Admitting: Psychology

## 2011-10-19 DIAGNOSIS — F411 Generalized anxiety disorder: Secondary | ICD-10-CM

## 2011-10-19 DIAGNOSIS — F332 Major depressive disorder, recurrent severe without psychotic features: Secondary | ICD-10-CM

## 2011-11-09 ENCOUNTER — Encounter (HOSPITAL_COMMUNITY): Payer: BC Managed Care – PPO | Admitting: Psychology

## 2011-11-09 ENCOUNTER — Ambulatory Visit (HOSPITAL_COMMUNITY): Payer: BC Managed Care – PPO | Admitting: Psychology

## 2011-11-23 ENCOUNTER — Encounter (HOSPITAL_COMMUNITY): Payer: Self-pay

## 2011-11-24 ENCOUNTER — Encounter (HOSPITAL_COMMUNITY): Payer: BC Managed Care – PPO | Admitting: Psychiatry

## 2011-11-24 ENCOUNTER — Encounter (HOSPITAL_COMMUNITY): Payer: Self-pay | Admitting: Psychiatry

## 2011-11-24 ENCOUNTER — Ambulatory Visit (INDEPENDENT_AMBULATORY_CARE_PROVIDER_SITE_OTHER): Payer: BC Managed Care – PPO | Admitting: Psychiatry

## 2011-11-24 VITALS — Wt 244.0 lb

## 2011-11-24 DIAGNOSIS — F331 Major depressive disorder, recurrent, moderate: Secondary | ICD-10-CM

## 2011-11-24 MED ORDER — ESCITALOPRAM OXALATE 20 MG PO TABS: 20.0000 mg | ORAL_TABLET | Freq: Every day | ORAL | Status: DC

## 2011-11-24 NOTE — Progress Notes (Signed)
Patient came for his followup appointment. He has birthday bed last week which he enjoyed. He had a good Thanksgiving. He is taking his Lexapro at night which is helping him for sleep. He denies any crying spells or any severe anxiety or depressive thoughts. He is compliant with his medication and reported no side effects. He is taking Valium from his primary care physician. Overall he feels much stable and better but medication. He just turned 65 and now he is excited as he does not have to pay for his medication. He also cut down his pain medication. He is seeing therapist on a regular basis.  Mental status emanation Patient is pleasant cooperative and maintained good eye contact. His his speech is soft clear and coherent. He described his mood is anxious but his affect is bright. Denies any active or passive suicidal thoughts. There no psychotic symptoms present. She's alert and oriented x3. His insight judgment and pulse control is okay.  Assessment Maj. depressive disorder  Plan I will continue his Lexapro 20 mg at bedtime. A new prescription of 90 days is given. I explained risks and benefits of medication. I will see him again in 10 weeks. I recommended to call if he has any question or concern about the medication.

## 2011-12-03 ENCOUNTER — Ambulatory Visit (INDEPENDENT_AMBULATORY_CARE_PROVIDER_SITE_OTHER): Payer: Medicare Other | Admitting: Otolaryngology

## 2011-12-03 DIAGNOSIS — K219 Gastro-esophageal reflux disease without esophagitis: Secondary | ICD-10-CM

## 2011-12-03 DIAGNOSIS — R42 Dizziness and giddiness: Secondary | ICD-10-CM

## 2011-12-03 DIAGNOSIS — H9319 Tinnitus, unspecified ear: Secondary | ICD-10-CM

## 2011-12-03 DIAGNOSIS — H903 Sensorineural hearing loss, bilateral: Secondary | ICD-10-CM

## 2011-12-05 ENCOUNTER — Other Ambulatory Visit (HOSPITAL_COMMUNITY): Payer: Self-pay | Admitting: Psychiatry

## 2011-12-23 ENCOUNTER — Ambulatory Visit (HOSPITAL_COMMUNITY)
Admission: RE | Admit: 2011-12-23 | Discharge: 2011-12-23 | Disposition: A | Discharge status: Home / Self Care | Payer: Medicare Other | Source: Ambulatory Visit | Attending: Orthopedic Surgery | Admitting: Orthopedic Surgery

## 2011-12-23 ENCOUNTER — Encounter (HOSPITAL_COMMUNITY): Payer: Self-pay

## 2011-12-23 DIAGNOSIS — IMO0001 Reserved for inherently not codable concepts without codable children: Secondary | ICD-10-CM | POA: Insufficient documentation

## 2011-12-23 DIAGNOSIS — M6281 Muscle weakness (generalized): Secondary | ICD-10-CM | POA: Insufficient documentation

## 2011-12-23 DIAGNOSIS — M25579 Pain in unspecified ankle and joints of unspecified foot: Secondary | ICD-10-CM | POA: Insufficient documentation

## 2011-12-23 DIAGNOSIS — R269 Unspecified abnormalities of gait and mobility: Secondary | ICD-10-CM | POA: Insufficient documentation

## 2011-12-23 DIAGNOSIS — M25669 Stiffness of unspecified knee, not elsewhere classified: Secondary | ICD-10-CM | POA: Insufficient documentation

## 2011-12-23 DIAGNOSIS — I10 Essential (primary) hypertension: Secondary | ICD-10-CM | POA: Insufficient documentation

## 2011-12-23 DIAGNOSIS — M25569 Pain in unspecified knee: Secondary | ICD-10-CM | POA: Insufficient documentation
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
## 2011-12-25 ENCOUNTER — Ambulatory Visit (HOSPITAL_COMMUNITY)
Admission: RE | Admit: 2011-12-25 | Discharge: 2011-12-25 | Disposition: A | Discharge status: Home / Self Care | Payer: Medicare Other | Source: Ambulatory Visit | Attending: Internal Medicine | Admitting: Internal Medicine

## 2011-12-25 DIAGNOSIS — R269 Unspecified abnormalities of gait and mobility: Secondary | ICD-10-CM

## 2011-12-25 DIAGNOSIS — M25579 Pain in unspecified ankle and joints of unspecified foot: Secondary | ICD-10-CM

## 2011-12-25 NOTE — Progress Notes (Signed)
Physical Therapy Treatment Patient Details  Name: Zachary [name] MRN: [id number] Date of Birth: 1946-07-31  Today's Date: 12/25/2011 Time: 9629-5284 Time Calculation (min): 56 min Charges: 10 ice, 10 neuro, 36 TE Visit#: 2  of 24   Re-eval: 01/21/11  Subjective: Symptoms/Limitations Symptoms: Patient reports compliance with HEP Pain Assessment Pain Score:   2 Pain Location: Knee Pain Orientation: Left Pain Type: Surgical pain  Exercise/Treatments Stretches Passive Hamstring Stretch: 2 reps;30 seconds;Limitations Passive Hamstring Stretch Limitations: with rope bil.   Aerobic Stationary Bike: 7.5, seat 13 level 1.0 Standing Heel Raises Limitations: 12 reps, also toe raises 12 reps Knee Flexion: 10 reps Terminal Knee Extension: 10 reps;Limitations Terminal Knee Extension Limitations: yellow ball against the wall, and then blue t-band 5 second holds. Lateral Step Up: 15 reps;Hand Hold: 0 Forward Step Up: 15 reps;Step Height: 4";Hand Hold: 0 Wall Squat: 15 reps;Limitations Wall Squat Limitations: only 45 degrees SLS: 4" R, 4" L Other Standing Knee Exercises: tandem stand 30" right leg back 26" left leg back Seated Long Arc Quad: 10 reps;Limitations Long Arc Quad Limitations: 5" holds Supine Quad Sets: 10 reps;Limitations Quad Sets Limitations: towel roll under knee 5" holds. Straight Leg Raises: 10 reps  Modalities Modalities: Cryotherapy Cryotherapy Number Minutes Cryotherapy: 10 Minutes Cryotherapy Location: Knee (supine with heel prop and 5# weight to encourage ext.  ) Type of Cryotherapy: Ice pack  Physical Therapy Assessment and Plan  Clinical Impression Statement: Patient is very point tender over his left ITB insertion all the way up to 1/2 way up his thigh.  May need to address next session.   PT Plan: add S/L and standing ITB stretch and [place] pulsed to left lateral knee next session.    Problem List Patient Active Problem List  Diagnoses  . TENOSYNOVITIS  OF FOOT AND ANKLE  . Lateral epicondylitis/tennis elbow  . Trigger point of extremity  . Pain in joint, ankle and foot  . Abnormality of gait    PT - End of Session Activity Tolerance: Patient limited by pain;Patient tolerated treatment well  Lurena Joiner B. Abhimanyu Cruces, PT, DPT  12/25/2011, 1:15 PM

## 2011-12-28 ENCOUNTER — Ambulatory Visit (HOSPITAL_COMMUNITY)
Admission: RE | Admit: 2011-12-28 | Discharge: 2011-12-28 | Disposition: A | Discharge status: Home / Self Care | Payer: Medicare Other | Source: Ambulatory Visit | Attending: Internal Medicine | Admitting: Internal Medicine

## 2011-12-28 DIAGNOSIS — R269 Unspecified abnormalities of gait and mobility: Secondary | ICD-10-CM

## 2011-12-28 DIAGNOSIS — M25579 Pain in unspecified ankle and joints of unspecified foot: Secondary | ICD-10-CM

## 2011-12-28 NOTE — Progress Notes (Signed)
Physical Therapy Treatment Patient Details  Name: Zachary [name] MRN: [id number] Date of Birth: [birth year]  Today's Date: 12/28/2011 Time: 0802-0914 Time Calculation (min): 72 min Visit#: 3  of 24   Re-eval: 01/22/12  Charge: therex 55 min [place] 5 min Ice 10 min  Subjective: Symptoms/Limitations Symptoms: Pt stated L knee really stiff today, shouldn't have taken yesterday off of HEP, pain scale 5/10. Pain Assessment Currently in Pain?: Yes Pain Score:   5 Pain Location: Knee Pain Orientation: Left  Precautions/Restrictions     Mobility (including Balance)       Exercise/Treatments Stretches Active Hamstring Stretch: 2 reps;30 seconds;Limitations Active Hamstring Stretch Limitations: with rope; B LE ITB Stretch: 2 reps;30 seconds Aerobic Stationary Bike: 8' @ 1.5 seat 15 (attempted seat 14 but too unconfortable) Standing Heel Raises: 15 reps;Limitations Heel Raises Limitations: toe raises 15 reps Knee Flexion: 15 reps Lateral Step Up: 15 reps;Step Height: 4" Forward Step Up: 15 reps;Step Height: 4" Step Down: 15 reps;Step Height: 4" Wall Squat: 15 reps;Limitations Wall Squat Limitations: only 45 degrees 5" holds Rocker Board: 2 minutes;Limitations Rocker Board Limitations: R/L; A/P SLS: R 13", L 15" Other Standing Knee Exercises: tandem stand 2x 30" with intermittent HHA Supine Quad Sets: 10 reps;Limitations Quad Sets Limitations: towel roll under knee 10" holds.   Modalities Modalities: Cryotherapy Cryotherapy Number Minutes Cryotherapy: 10 Minutes Cryotherapy Location: Knee Type of Cryotherapy: Ice pack Ultrasound Ultrasound Location: L ITB insertion point Ultrasound Parameters: 1.0 w/cm2 pulsed 3 MHz x 5 min Ultrasound Goals: Pain  Physical Therapy Assessment and Plan PT Assessment and Plan Clinical Impression Statement: Added rocker board, instability noted, required intermittent HHA.  Noted increased tenderness on distal ITB insertion point,  added  supine ITB stretch and ended session with [place] to ITB insertion point.  Pt stated pain free at end of session. PT Plan: Assess pain relief with [place], progress strength and balance.    Goals    Problem List Patient Active Problem List  Diagnoses  . TENOSYNOVITIS OF FOOT AND ANKLE  . Lateral epicondylitis/tennis elbow  . Trigger point of extremity  . Pain in joint, ankle and foot  . Abnormality of gait    PT - End of Session Activity Tolerance: Patient tolerated treatment well General Behavior During Session: [facility] for tasks performed Cognition: [facility] for tasks performed  [name] 12/28/2011, 9:21 AM

## 2011-12-30 ENCOUNTER — Ambulatory Visit (HOSPITAL_COMMUNITY): Admission: RE | Admit: 2011-12-30 | Payer: Medicare Other | Source: Ambulatory Visit

## 2011-12-31 ENCOUNTER — Ambulatory Visit (HOSPITAL_COMMUNITY)
Admission: RE | Admit: 2011-12-31 | Discharge: 2011-12-31 | Disposition: A | Discharge status: Home / Self Care | Payer: Medicare Other | Source: Ambulatory Visit | Attending: Internal Medicine | Admitting: Internal Medicine

## 2011-12-31 DIAGNOSIS — M25669 Stiffness of unspecified knee, not elsewhere classified: Secondary | ICD-10-CM | POA: Insufficient documentation

## 2011-12-31 DIAGNOSIS — M25579 Pain in unspecified ankle and joints of unspecified foot: Secondary | ICD-10-CM

## 2011-12-31 DIAGNOSIS — I10 Essential (primary) hypertension: Secondary | ICD-10-CM | POA: Diagnosis not present

## 2011-12-31 DIAGNOSIS — IMO0001 Reserved for inherently not codable concepts without codable children: Secondary | ICD-10-CM | POA: Diagnosis not present

## 2011-12-31 DIAGNOSIS — M6281 Muscle weakness (generalized): Secondary | ICD-10-CM | POA: Insufficient documentation

## 2011-12-31 DIAGNOSIS — R269 Unspecified abnormalities of gait and mobility: Secondary | ICD-10-CM

## 2011-12-31 DIAGNOSIS — M25569 Pain in unspecified knee: Secondary | ICD-10-CM | POA: Insufficient documentation

## 2011-12-31 NOTE — Progress Notes (Signed)
Physical Therapy Treatment Patient Details  Name: Zachary [name] MRN: [id number] Date of Birth: Nov 13, 1946  Today's Date: 12/31/2011 Time: 4098-1191 Time Calculation (min): 55 min Visit#: 4  of 24   Re-eval: 01/22/12  Charge: therex 40 min [place] 5 min Ice 10 min  Subjective: Symptoms/Limitations Symptoms: Feeling pretty good today maybe a 2-3/10. Pain Assessment Currently in Pain?: Yes Pain Score:   3 Pain Location: Knee Pain Orientation: Left  Objective:   Exercise/Treatments Stretches Active Hamstring Stretch: 2 reps;30 seconds;Limitations ITB Stretch: 2 reps;30 seconds Aerobic Stationary Bike: 8' @ 3.0 seat 13 -->12 Standing Heel Raises: 20 reps;Limitations Heel Raises Limitations: toe raises 20 reps Terminal Knee Extension: 15 reps;Theraband;Limitations Theraband Level (Terminal Knee Extension): Level 4 (Blue) Terminal Knee Extension Limitations: 5" holds Lateral Step Up: 20 reps;Step Height: 4" Forward Step Up: 20 reps;Step Height: 4" Step Down: 20 reps;Step Height: 4" Wall Squat: 15 reps;Limitations Wall Squat Limitations: only 45 degrees 5" holds Rocker Board: 2 minutes;Limitations Rocker Board Limitations: R/L; A/P  Modalities Modalities: Ultrasound Ultrasound Ultrasound Location: L ITB insertion point and anterior knee cap. Ultrasound Parameters: 1.0 w/cm2 pulsed 3 MHz x 5 min  Ultrasound Goals: Pain  Physical Therapy Assessment and Plan PT Assessment and Plan Clinical Impression Statement: Activitiy tolerance improving, able to increase reps with min vc-ing to slow down for more effective strengthening.  ROM improving, able to scoot up bike chair to seat 12.  Instability still noted on rockerboard.  Pt stated pain relief following [place]. PT Plan: Progress strength and balance.    Goals    Problem List Patient Active Problem List  Diagnoses  . TENOSYNOVITIS OF FOOT AND ANKLE  . Lateral epicondylitis/tennis elbow  . Trigger point of extremity  . Pain  in joint, ankle and foot  . Abnormality of gait    PT - End of Session Activity Tolerance: Patient tolerated treatment well General Behavior During Session: [facility] for tasks performed Cognition: [facility] for tasks performed  [name] 12/31/2011, 10:48 AM

## 2012-01-01 ENCOUNTER — Ambulatory Visit (HOSPITAL_COMMUNITY)
Admission: RE | Admit: 2012-01-01 | Discharge: 2012-01-01 | Disposition: A | Discharge status: Home / Self Care | Payer: Medicare Other | Source: Ambulatory Visit | Attending: Internal Medicine | Admitting: Internal Medicine

## 2012-01-01 DIAGNOSIS — R269 Unspecified abnormalities of gait and mobility: Secondary | ICD-10-CM

## 2012-01-01 DIAGNOSIS — M25579 Pain in unspecified ankle and joints of unspecified foot: Secondary | ICD-10-CM

## 2012-01-01 NOTE — Progress Notes (Signed)
Physical Therapy Treatment Patient Details  Name: Zachary [name] MRN: [id number] Date of Birth: [birth year]  Today's Date: 01/01/2012 Time: 6578-4696 Time Calculation (min): 66 min Charges: 8 mins [place], 10 neuro re ed, 48 TE Visit#: 5  of 24   Re-eval: 01/22/12    Subjective: Symptoms/Limitations Symptoms: stretches seem to be helping lateral knee pain.   Pain Assessment Currently in Pain?: Yes Pain Score:   4 Pain Location: Knee Pain Orientation: Left  Exercise/Treatments Stretches Passive Hamstring Stretch: 2 reps;30 seconds;Limitations Passive Hamstring Stretch Limitations: with rope bil   ITB Stretch: 2 reps;30 seconds Aerobic Stationary Bike: 8' 3.0 Standing Heel Raises: 20 reps;Limitations Heel Raises Limitations: toe raises 20 reps Knee Flexion: 15 reps;Limitations Knee Flexion Limitations: 2# Lateral Step Up: 20 reps;Step Height: 4" Forward Step Up: 20 reps;Step Height: 4" Step Down: 20 reps;Step Height: 4" Wall Squat: 15 reps;Limitations Wall Squat Limitations: only 45 degrees 5" holds Rocker Board: 2 minutes;Limitations Rocker Board Limitations: R/L; A/P SLS: R 12", L 4" Other Standing Knee Exercises: tandem stand 2x 30" with intermittent HHA Seated Long Arc Quad: 10 reps;Limitations Long Arc Quad Limitations: 5" holds 2# weights Supine Quad Sets: 10 reps;Limitations Quad Sets Limitations: towel roll under knee 10" holds. Straight Leg Raises: 10 reps;Limitations Straight Leg Raises Limitations: 2#  Modalities Modalities: Ultrasound Ultrasound Ultrasound Location: lateral left knee Ultrasound Parameters: small sound head, 1.2 w/cm2, continuous, 8 mins Ultrasound Goals: Pain;Other (Comment) (tissue inflammation)  Physical Therapy Assessment and Plan  Clinical Impression Statement: Patient seems to improving with pain on lateral knee with stretches and [place], will ice at home later if needed, also, single leg balance is coming along slowly.  He is practicing  SLS at home.   PT Plan: Continue with current POC, add PROM flexion next session    Problem List Patient Active Problem List  Diagnoses  . TENOSYNOVITIS OF FOOT AND ANKLE  . Lateral epicondylitis/tennis elbow  . Trigger point of extremity  . Pain in joint, ankle and foot  . Abnormality of gait   PT Plan of Care PT Home Exercise Plan: continue to encourage SLS practice bil at home.    Rollene Rotunda Keiston Manley, PT, DPT  01/01/2012, 12:08 PM

## 2012-01-04 ENCOUNTER — Ambulatory Visit (HOSPITAL_COMMUNITY): Payer: Medicare Other | Admitting: Physical Therapy

## 2012-01-05 ENCOUNTER — Ambulatory Visit (HOSPITAL_COMMUNITY)
Admission: RE | Admit: 2012-01-05 | Discharge: 2012-01-05 | Disposition: A | Discharge status: Home / Self Care | Payer: Medicare Other | Source: Ambulatory Visit | Attending: Internal Medicine | Admitting: Internal Medicine

## 2012-01-05 DIAGNOSIS — R269 Unspecified abnormalities of gait and mobility: Secondary | ICD-10-CM

## 2012-01-05 DIAGNOSIS — M25579 Pain in unspecified ankle and joints of unspecified foot: Secondary | ICD-10-CM

## 2012-01-05 NOTE — Progress Notes (Signed)
Physical Therapy Treatment Patient Details  Name: Zachary [name] MRN: [id number] Date of Birth: 04-03-1946  Today's Date: 01/05/2012 Time: 2130-8657 Time Calculation (min): 55 min Charges: 10 ice, 8 [place], 8 neuro re ed, 64 TE Visit#: 6  of 24   Re-eval: 01/22/12  Subjective: Symptoms/Limitations Symptoms: Left knee poped on Saturday "felt like something gave way" and then Leard the rest of the day, but no pain now.   Pain Assessment Currently in Pain?: No/denies Pain Score: 0-No pain Pain Location: Knee Pain Orientation: Left  Exercise/Treatments Stretches Passive Hamstring Stretch: 2 reps;30 seconds;Limitations Passive Hamstring Stretch Limitations: with rope bil   ITB Stretch: 2 reps;30 seconds Aerobic Stationary Bike: 6' 6.0 seat 14 Standing Heel Raises: 20 reps;Limitations Heel Raises Limitations: toe raises 20 reps Knee Flexion: 15 reps;Limitations Knee Flexion Limitations: 2# Lateral Step Up: Step Height: 4";15 reps Forward Step Up: Step Height: 4";15 reps Step Down: Step Height: 4";15 reps Rocker Board: 2 minutes;Limitations Rocker Board Limitations: R/L SLS: R 12", L 4" Other Standing Knee Exercises: tandem stand 2x 30" with intermittent HHA Supine Straight Leg Raises: 10 reps;Limitations Straight Leg Raises Limitations: 2#  Modalities Modalities: Cryotherapy;Ultrasound Cryotherapy Number Minutes Cryotherapy: 10 Minutes Cryotherapy Location: Knee Type of Cryotherapy: Ice pack Ultrasound Ultrasound Location: left lateral knee  Ultrasound Parameters: small sound head, 1.2 w/cm2, continuous, 8 mins   Physical Therapy Assessment and Plan PT Assessment and Plan Clinical Impression Statement: The patient continues to be very tender on lateral knee along ITB.  His ROM is improving in flexion, but he still has significant pain at end ROM.   PT Plan: Add STM to lateral knee along ITB, continue [place] and ice as needed for pain.  Try to do PROM extension to left knee next  session as well.      Problem List Patient Active Problem List  Diagnoses  . TENOSYNOVITIS OF FOOT AND ANKLE  . Lateral epicondylitis/tennis elbow  . Trigger point of extremity  . Pain in joint, ankle and foot  . Abnormality of gait    PT - End of Session Activity Tolerance: Patient tolerated treatment well General Behavior During Session: [facility] for tasks performed Cognition: [facility] for tasks performed  Koleen Celia B. Brylin Stanislawski, PT, DPT  01/05/2012, 3:21 PM

## 2012-01-06 ENCOUNTER — Ambulatory Visit (HOSPITAL_COMMUNITY)
Admission: RE | Admit: 2012-01-06 | Discharge: 2012-01-06 | Disposition: A | Discharge status: Home / Self Care | Payer: Medicare Other | Source: Ambulatory Visit | Attending: Internal Medicine | Admitting: Internal Medicine

## 2012-01-06 DIAGNOSIS — R269 Unspecified abnormalities of gait and mobility: Secondary | ICD-10-CM

## 2012-01-06 DIAGNOSIS — M25579 Pain in unspecified ankle and joints of unspecified foot: Secondary | ICD-10-CM

## 2012-01-06 NOTE — Progress Notes (Signed)
Physical Therapy Treatment Patient Details  Name: DOIL KAMARA MRN: [id number] Date of Birth: 27-Jun-1946  Today's Date: 01/06/2012 Time: 1101-1201 Time Calculation (min): 60 min Charges: 8 [place], 8 STM, 42 TE Visit#: 7  of 24   Re-eval: 01/22/12    Subjective: Symptoms/Limitations Symptoms: Patient only " a litte sore" after yesterday's treatment session.   Pain Assessment Currently in Pain?: Yes Pain Score:   3 Pain Location: Knee Pain Orientation: Left;Lateral Pain Type: Surgical pain  Exercise/Treatments Stretches Passive Hamstring Stretch: 2 reps;30 seconds;Limitations Passive Hamstring Stretch Limitations: with rope bil   Aerobic Stationary Bike: 6' 3.0 seat 14 Standing Heel Raises: 20 reps;Limitations Heel Raises Limitations: toe raises 20 reps Knee Flexion: 15 reps;Limitations Knee Flexion Limitations: 2# Lateral Step Up: 15 reps;Step Height: 6" Forward Step Up: 15 reps;Step Height: 6" Step Down: 15 reps;Step Height: 6" Rocker Board: 2 minutes;Limitations Rocker Board Limitations: R/L, A/P Seated Long Arc Quad: 15 reps Long [employer] Limitations: 2# Supine Straight Leg Raises: Limitations;15 reps Straight Leg Raises Limitations: 2# Other Supine Knee Exercises: PROM knee ext 3x30"  Sidelying Hip ABduction: 10 reps;Limitations Hip ABduction Limitations: 2# Prone  Hamstring Curl: 10 reps;Limitations Other Prone Exercises: PROM knee flex 3 x 30 sec holds.    Modalities Modalities: Ultrasound Manual Therapy Manual Therapy: Massage Massage: STM in S/L to left ITB near distal femur L leg before [place] Ultrasound Ultrasound Location: Left lateral knee and Left patellar tendon Ultrasound Parameters: small sound head, 1.2 w/cm2, continuous, 8 mins   Physical Therapy Assessment and Plan PT Assessment and Plan Clinical Impression Statement: Patient continues to have a very tender L ITB near the insertion and distal femur.  He was also point tender at the inferior  pole of the patella to palpation.   PT Plan: Continue STM to lateral knee and [place], stop ice though, because we want to keep the thermal effect of the STM and [place].  Patient was instructed to ice at home later after therapy.  Add S/L ITB stretch.      Problem List Patient Active Problem List  Diagnoses  . TENOSYNOVITIS OF FOOT AND ANKLE  . Lateral epicondylitis/tennis elbow  . Trigger point of extremity  . Pain in joint, ankle and foot  . Abnormality of gait    PT - End of Session Activity Tolerance: Patient tolerated treatment well;Patient limited by pain  Dian Minahan B. Paulina Muchmore, PT, DPT  01/06/2012, 12:06 PM

## 2012-01-07 ENCOUNTER — Ambulatory Visit (HOSPITAL_COMMUNITY)
Admission: RE | Admit: 2012-01-07 | Discharge: 2012-01-07 | Disposition: A | Discharge status: Home / Self Care | Payer: Medicare Other | Source: Ambulatory Visit | Attending: Internal Medicine | Admitting: Internal Medicine

## 2012-01-07 DIAGNOSIS — R269 Unspecified abnormalities of gait and mobility: Secondary | ICD-10-CM

## 2012-01-07 DIAGNOSIS — M25579 Pain in unspecified ankle and joints of unspecified foot: Secondary | ICD-10-CM

## 2012-01-07 NOTE — Progress Notes (Signed)
Physical Therapy Treatment Patient Details  Name: Zachary [name] MRN: [id number] Date of Birth: 06-16-1946  Today's Date: 01/07/2012 Time: 1110-1220 Time Calculation (min): 70 min Visit#: 8  of 24   Re-eval: 01/22/12  Charge: therex 48 min [place] 8 min Manual 8 min  Subjective: Symptoms/Limitations Symptoms: Felt good following last session, feel like it helped my ROM.  Little stiff today.  Did the ice ~2 hours following treatment for 15 minutes. Pain Assessment Currently in Pain?: No/denies  Objective:   Exercise/Treatments Stretches Active Hamstring Stretch: 1 rep;30 seconds;Limitations Active Hamstring Stretch Limitations: with rp Passive Hamstring Stretch: 2 reps;30 seconds;Limitations Quad Stretch: 3 reps;30 seconds Aerobic Stationary Bike: 6' @ 4.0 seat 12 Standing Heel Raises: 20 reps;Limitations Heel Raises Limitations: toe raises 20 reps Knee Flexion: 20 reps;Limitations Knee Flexion Limitations: 3# Lateral Step Up: 20 reps;Step Height: 6" Forward Step Up: 20 reps;Step Height: 6" Step Down: 20 reps;Step Height: 6" Rocker Board: 2 minutes;Limitations Rocker Board Limitations: R/L, A/P SLS: R 35", L 11" max of 3 Supine Heel Slides: 10 reps Knee Extension: PROM;3 sets;Limitations Knee Extension Limitations: 30" holds Sidelying Other Sidelying Knee Exercises: ITB st 3x 30" time Prone  Hamstring Curl: 15 reps Contract/Relax to Increase Flexion: C/R to increase flexion 3 reps Other Prone Exercises: PROM knee flex 3 x 30 sec holds with gait belt distraction   Modalities Modalities: Ultrasound Manual Therapy Massage: STM to left ITB near distal femur L leg following [place] Ultrasound Ultrasound Location: Left lateral knee and Left patellar tendon  Ultrasound Parameters: small sound head, 1.2 w/cm2, continuous, 8 mins  Ultrasound Goals: Pain;Other (Comment) (tissue inflammation)  Physical Therapy Assessment and Plan PT Assessment and Plan Clinical Impression  Statement: Added distraction with prone PROM for increased joint motion with increased flexion.  L ITB continues to be very tender near the insertion and distal femur. PT Plan: Continue STM to lateral knee and [place], stop ice though, because we want to keep the thermal effect of the STM and [place]. Patient was instructed to ice at home later after therapy. Add S/L ITB stretch    Goals    Problem List Patient Active Problem List  Diagnoses  . TENOSYNOVITIS OF FOOT AND ANKLE  . Lateral epicondylitis/tennis elbow  . Trigger point of extremity  . Pain in joint, ankle and foot  . Abnormality of gait    PT - End of Session Activity Tolerance: Patient tolerated treatment well General Behavior During Session: [facility] for tasks performed Cognition: [facility] for tasks performed  [name] 01/07/2012, 12:25 PM

## 2012-01-11 ENCOUNTER — Ambulatory Visit (HOSPITAL_COMMUNITY)
Admission: RE | Admit: 2012-01-11 | Discharge: 2012-01-11 | Disposition: A | Discharge status: Home / Self Care | Payer: Medicare Other | Source: Ambulatory Visit | Attending: Internal Medicine | Admitting: Internal Medicine

## 2012-01-11 DIAGNOSIS — M25579 Pain in unspecified ankle and joints of unspecified foot: Secondary | ICD-10-CM

## 2012-01-11 DIAGNOSIS — R269 Unspecified abnormalities of gait and mobility: Secondary | ICD-10-CM

## 2012-01-11 NOTE — Progress Notes (Signed)
Physical Therapy Treatment Patient Details  Name: Zachary [name] MRN: [id number] Date of Birth: [birth year]  Today's Date: 01/11/2012 Time: 1107-1205 Time Calculation (min): 58 min Charges: 8 [place], 8 massage, 8 neuro, 34 TE Visit#: 9  of 24   Re-eval: 01/22/12    Subjective: Symptoms/Limitations Symptoms: Patient reports that he hit his left lateral knee on a table at [employer] this morning and it tingled and [place] for a long while.   Pain Assessment Currently in Pain?: Yes Pain Score:   5 Pain Location: Knee Pain Orientation: Left;Lateral Pain Type: Acute pain  Exercise/Treatments Stretches Passive Hamstring Stretch: 2 reps;30 seconds;Limitations ITB Stretch: 2 reps;30 seconds;Limitations ITB Stretch Limitations: supine Aerobic Stationary Bike: 7' @ 4.0 seat 12 Standing Heel Raises: 20 reps;Limitations Heel Raises Limitations: toe raises 20 reps Knee Flexion: 20 reps;Limitations Knee Flexion Limitations: 3# Terminal Knee Extension: AROM;Left;15 reps;Limitations Terminal Knee Extension Limitations: 5" holds Lateral Step Up: 20 reps;Step Height: 6" Forward Step Up: 20 reps;Step Height: 6" Step Down: 20 reps;Step Height: 6" Rocker Board: 2 minutes;Limitations Rocker Board Limitations: R/L Seated Long Arc Quad: 15 reps Long [employer] Limitations: 3#, 5" holds Supine Straight Leg Raises: Limitations;15 reps Straight Leg Raises Limitations: 3# Knee Extension: PROM;3 sets;Limitations Knee Extension Limitations: 30" holds Sidelying Other Sidelying Knee Exercises: ITB st 3x 30" time Prone  Other Prone Exercises: PROM knee flex 3 x 30 sec holds with gait belt distraction   Modalities Modalities: Ultrasound Manual Therapy Manual Therapy: Massage Massage: STM to left ITB near distal femur L leg following [place]  Ultrasound Ultrasound Location: Left lateral knee and Left patellar tendon  Ultrasound Parameters: small sound head, 1.4 w/cm2, continuous, 8 mins Ultrasound Goals:  Pain  Physical Therapy Assessment and Plan  Clinical Impression Statement: Patient tolerated treatment well.  Significant fatigue with step ups on 6" step.   PT Plan: Increase weight to 4# left knee flexion in standing. Continue s/l ITB stretch in addition to supine ITB stretch. Progress all ther ex with weights (SAQs and s/l hip abduction.    Problem List Patient Active Problem List  Diagnoses  . TENOSYNOVITIS OF FOOT AND ANKLE  . Lateral epicondylitis/tennis elbow  . Trigger point of extremity  . Pain in joint, ankle and foot  . Abnormality of gait    PT - End of Session Activity Tolerance: Patient tolerated treatment well General Behavior During Session: [facility] for tasks performed Cognition: [name] for tasks performed  [name]. [name], PT, DPT [phone] 01/11/2012, 12:12 PM

## 2012-01-13 ENCOUNTER — Ambulatory Visit (HOSPITAL_COMMUNITY)
Admission: RE | Admit: 2012-01-13 | Discharge: 2012-01-13 | Disposition: A | Discharge status: Home / Self Care | Payer: Medicare Other | Source: Ambulatory Visit | Attending: Internal Medicine | Admitting: Internal Medicine

## 2012-01-13 DIAGNOSIS — M25579 Pain in unspecified ankle and joints of unspecified foot: Secondary | ICD-10-CM

## 2012-01-13 DIAGNOSIS — R269 Unspecified abnormalities of gait and mobility: Secondary | ICD-10-CM

## 2012-01-13 NOTE — Progress Notes (Signed)
Physical Therapy Treatment Patient Details  Name: Zachary [name] MRN: [id number] Date of Birth: 09-02-1946  Today's Date: 01/13/2012 Time: 1100-1200 Time Calculation (min): 60 min Charges: 8 [place], 8 massage, 8 neuro re ed, 36 TE Visit#: 10  of 24   Re-eval: 01/22/12 Assessment Next MD Visit: 01/25/12  Subjective: Symptoms/Limitations Symptoms: Feels better today.   Pain Assessment Currently in Pain?: Yes Pain Score:   1 Pain Location: Knee Pain Orientation: Left Pain Type: Chronic pain Pain Frequency: Intermittent  Exercise/Treatments Stretches Active Hamstring Stretch: 30 seconds;Limitations;2 reps Active Hamstring Stretch Limitations: with rp ITB Stretch: 2 reps;30 seconds;Limitations ITB Stretch Limitations: supine Aerobic Stationary Bike: 8' @ 4.5 Standing Knee Flexion: 20 reps;Limitations Knee Flexion Limitations: 3# Terminal Knee Extension: AROM;Left;15 reps;Limitations Theraband Level (Terminal Knee Extension): Level 4 (Blue) Terminal Knee Extension Limitations: 5" holds Lateral Step Up: 20 reps;Step Height: 6" Forward Step Up: 20 reps;Step Height: 6" Step Down: 20 reps;Step Height: 6" Wall Squat: 15 reps Wall Squat Limitations: 45 deg 5" SLS: R 9" L 6" Seated Long Arc Quad: 15 reps Long Arc Quad Limitations: 5#, 5" holds Supine Straight Leg Raises: Limitations;15 reps Straight Leg Raises Limitations: 3# Knee Extension: PROM;3 sets;Limitations Knee Extension Limitations: 30" holds Sidelying Hip ABduction: 10 reps Hip ABduction Limitations: 3# Other Sidelying Knee Exercises: ITB st 2x 30" time Prone  Hamstring Curl: 15 reps;Limitations Hamstring Curl Limitations: 5# (3# was too easy) Other Prone Exercises: PROM knee flex 3 x 30 sec holds  Modalities Modalities: Ultrasound Manual Therapy Manual Therapy: Massage Massage: STM to left ITB near distal femur L leg  Ultrasound Ultrasound Location: Left lateral knee and Left patellar tendon  Ultrasound  Parameters: small sound head, 1.4 w/cm2, continuous, 8 mins Ultrasound Goals: Pain  Physical Therapy Assessment and Plan  Clinical Impression Statement: The patient is progressing well with ther ex with weights.  His knee flexion ROM seems to be improving per visual comparison to right.  His extension is still lacking compared to the right.  PT Plan: Continue with current POC.  Increase standing knee flexion to 5#, add cybex leg press, knee ext, and hamstring curl bil legs.      Problem List Patient Active Problem List  Diagnoses  . TENOSYNOVITIS OF FOOT AND ANKLE  . Lateral epicondylitis/tennis elbow  . Trigger point of extremity  . Pain in joint, ankle and foot  . Abnormality of gait    PT - End of Session Activity Tolerance: Patient tolerated treatment well PT Plan of Care PT Home Exercise Plan: added TKE to HEP gave blue t-band.   Consulted and Agree with Plan of Care: Patient  Rollene Rotunda. Severo Beber, PT, DPT  01/13/2012, 12:10 PM

## 2012-01-15 ENCOUNTER — Ambulatory Visit (HOSPITAL_COMMUNITY)
Admission: RE | Admit: 2012-01-15 | Discharge: 2012-01-15 | Disposition: A | Discharge status: Home / Self Care | Payer: Medicare Other | Source: Ambulatory Visit | Attending: Internal Medicine | Admitting: Internal Medicine

## 2012-01-15 DIAGNOSIS — R269 Unspecified abnormalities of gait and mobility: Secondary | ICD-10-CM

## 2012-01-15 DIAGNOSIS — M25579 Pain in unspecified ankle and joints of unspecified foot: Secondary | ICD-10-CM

## 2012-01-15 NOTE — Progress Notes (Signed)
Physical Therapy Treatment Patient Details  Name: [name] MRN: [id number] Date of Birth: May 16, 1946  Today's Date: 01/15/2012 Time: 1115-1220 Time Calculation (min): 65 min Visit#: 11  of 24   Re-eval: 01/22/12  Charge: therex 38 min [place] 8 min Manual 8 min  Subjective: Symptoms/Limitations Symptoms: No real pain just acheing medial and lateral portions of knee today maybe a 2/10 pain scale. Pain Assessment Currently in Pain?: Yes Pain Score:   2 Pain Location: Knee Pain Orientation: Left;Medial;Lateral  Objective:   Exercise/Treatments Stretches Active Hamstring Stretch: 3 reps;30 seconds Active Hamstring Stretch Limitations: with rope ITB Stretch: 3 reps;30 seconds Aerobic Stationary Bike: 8' @ 6.0 Machines for Strengthening Cybex Knee Extension: 3 PL B up L LE lower 15 reps Cybex Knee Flexion: 6 PL B LE  Cybex Leg Press: 6 PL L LE only Standing Knee Flexion: 20 reps;Limitations Knee Flexion Limitations: 4# Terminal Knee Extension: AROM;20 reps;Theraband;Limitations Theraband Level (Terminal Knee Extension): Level 4 (Blue) Terminal Knee Extension Limitations: 5" holds Lateral Step Up: 20 reps;Step Height: 6";Hand Hold: 1 Forward Step Up: 20 reps;Step Height: 6";Hand Hold: 0 Step Down: 20 reps;Step Height: 6";Hand Hold: 1 Wall Squat: 20 reps;10 seconds SLS: R 12", L 18" max of 3   Modalities Modalities: Ultrasound Manual Therapy Massage: STM to left ITB near distal femur L leg  and medial h.s insertion/gastroc origin x 8 min Ultrasound Ultrasound Location: Left lateral knee and Left patellar tendon  Ultrasound Parameters: 3 MHz small sound head, 1.4 w/cm2, continuous, 8 mins  Ultrasound Goals: Pain  Physical Therapy Assessment and Plan PT Assessment and Plan Clinical Impression Statement: Progressed to cybex leg press, quad and hamstring machines, pt able to complete correctly with visible quad fatigue noted.  Time with PROM, need to continue for  terminal extension. PT Plan: Continue with current POC, increase standing knee flexion to 5#, resume PROM.    Goals    Problem List Patient Active Problem List  Diagnoses  . TENOSYNOVITIS OF FOOT AND ANKLE  . Lateral epicondylitis/tennis elbow  . Trigger point of extremity  . Pain in joint, ankle and foot  . Abnormality of gait    PT - End of Session Activity Tolerance: Patient tolerated treatment well General Behavior During Session: [facility] for tasks performed Cognition: [facility] for tasks performed  Zachary [name] 01/15/2012, 12:28 PM

## 2012-01-18 ENCOUNTER — Ambulatory Visit (HOSPITAL_COMMUNITY)
Admission: RE | Admit: 2012-01-18 | Discharge: 2012-01-18 | Disposition: A | Discharge status: Home / Self Care | Payer: Medicare Other | Source: Ambulatory Visit | Attending: Internal Medicine | Admitting: Internal Medicine

## 2012-01-18 NOTE — Progress Notes (Signed)
  Patient Details  Name: DORSEL FLINN MRN: [id number] Date of Birth: [birth year]  Today's Date: 01/18/2012 Time:  11:05 am Charges:  None  Pt. Came into clinic complaining of headache on the L side and stated he just didn't feel right, stating felt he was "outside himself".  Pt. Seated and BP taken of 150/80 mmHg from L UE.  Taken again 10 minutes later at 148/80 mmHg.  Pt. Reported his Right hand went numb after breakfast this morning and states he normally takes BP medication, which he has not had this morning.  Suggested pt go to ED to get evaluated but pt. Wished not to go stating wife would not be happy.  Contacted wife Dennie Bible) via phone and reported pt. Status.  Wife stated she thought pt. Should just go home, take meds and rest and she would keep a check on him.  Walked pt. Out to car and he stated he felt a little dizzy but was OK to drive home.   Amy B. Bascom Levels, PTA 01/18/2012, 12:07 PM

## 2012-01-20 ENCOUNTER — Ambulatory Visit (HOSPITAL_COMMUNITY): Payer: Medicare Other

## 2012-01-20 ENCOUNTER — Telehealth (HOSPITAL_COMMUNITY): Payer: Self-pay

## 2012-01-21 ENCOUNTER — Ambulatory Visit (INDEPENDENT_AMBULATORY_CARE_PROVIDER_SITE_OTHER): Payer: Medicare Other | Admitting: Otolaryngology

## 2012-01-21 ENCOUNTER — Encounter (HOSPITAL_COMMUNITY): Payer: Self-pay | Admitting: Psychiatry

## 2012-01-21 ENCOUNTER — Ambulatory Visit (INDEPENDENT_AMBULATORY_CARE_PROVIDER_SITE_OTHER): Payer: Medicare Other | Admitting: Psychiatry

## 2012-01-21 ENCOUNTER — Ambulatory Visit (HOSPITAL_COMMUNITY): Payer: Medicare Other | Admitting: Psychiatry

## 2012-01-21 DIAGNOSIS — H903 Sensorineural hearing loss, bilateral: Secondary | ICD-10-CM

## 2012-01-21 DIAGNOSIS — R42 Dizziness and giddiness: Secondary | ICD-10-CM

## 2012-01-21 DIAGNOSIS — H9319 Tinnitus, unspecified ear: Secondary | ICD-10-CM

## 2012-01-21 DIAGNOSIS — F329 Major depressive disorder, single episode, unspecified: Secondary | ICD-10-CM

## 2012-01-21 NOTE — Progress Notes (Signed)
Patient came for his followup appointment. He has been noticing more depressed anxious and nervousness. He did he is not taking Lexapro did not give the reason why he stopped. He complained at times dizzy spell and racing thoughts. He is taking Valium 5 mg however when he called his primary care physician he was told that he can take to Valium but he is afraid taking more Valium as Valium causing him more sedated. Though he denies any active or passive suicidal thoughts but noticed more isolated withdrawn and nervous. He denies any agitation anger but somewhat preoccupied with his somatic complaints.  Mental status examination Patient is casually dressed and fairly groomed. He appears somewhat tense anxious and emotional. He is easily tearful and distracted. Denies any active or passive suicidal thoughts or homicidal thoughts. He described his mood is anxious and his affect is constricted. He denies any auditory or visual hallucination. There no psychotic symptoms present. His thought process is slow but logical linear and goal-directed. She's alert and oriented x3. His insight judgment is fair however his impulse control is okay.  Assessment Major depressive disorder  Plan I think patient is slowly decompensating as he is not taking his Lexapro. It is unclear why he is stop Lexapro as he do not recall any side effects. I recommended to restart Lexapro 10 mg daily and if he noticed any side effects and he need to call us. He still has left of her Lexapro from previous refill. I also encouraged him to see therapist and if she start noticing worsening of her symptoms or any time having suicidal thoughts and homicidal thoughts that he need to go to local ER or call 911. I will see him again in 2 weeks. No new prescription given on this visit. I also recommended to continue Valium once a day.

## 2012-01-22 ENCOUNTER — Other Ambulatory Visit (INDEPENDENT_AMBULATORY_CARE_PROVIDER_SITE_OTHER): Payer: Self-pay | Admitting: Otolaryngology

## 2012-01-22 ENCOUNTER — Ambulatory Visit (HOSPITAL_COMMUNITY): Payer: Medicare Other

## 2012-01-22 DIAGNOSIS — H93A9 Pulsatile tinnitus, unspecified ear: Secondary | ICD-10-CM

## 2012-01-25 ENCOUNTER — Ambulatory Visit (HOSPITAL_COMMUNITY)
Admission: RE | Admit: 2012-01-25 | Discharge: 2012-01-25 | Disposition: A | Discharge status: Home / Self Care | Payer: Medicare Other | Source: Ambulatory Visit | Attending: Internal Medicine | Admitting: Internal Medicine

## 2012-01-25 DIAGNOSIS — R269 Unspecified abnormalities of gait and mobility: Secondary | ICD-10-CM

## 2012-01-25 DIAGNOSIS — M25579 Pain in unspecified ankle and joints of unspecified foot: Secondary | ICD-10-CM
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
## 2012-01-26 DIAGNOSIS — M1710 Unilateral primary osteoarthritis, unspecified knee: Secondary | ICD-10-CM | POA: Diagnosis not present

## 2012-01-26 DIAGNOSIS — IMO0002 Reserved for concepts with insufficient information to code with codable children: Secondary | ICD-10-CM | POA: Diagnosis not present

## 2012-01-26 DIAGNOSIS — S83289A Other tear of lateral meniscus, current injury, unspecified knee, initial encounter: Secondary | ICD-10-CM | POA: Diagnosis not present

## 2012-01-27 ENCOUNTER — Ambulatory Visit (HOSPITAL_COMMUNITY)
Admission: RE | Admit: 2012-01-27 | Discharge: 2012-01-27 | Disposition: A | Discharge status: Home / Self Care | Payer: Medicare Other | Source: Ambulatory Visit | Attending: Orthopedic Surgery | Admitting: Orthopedic Surgery

## 2012-01-27 DIAGNOSIS — R269 Unspecified abnormalities of gait and mobility: Secondary | ICD-10-CM

## 2012-01-27 DIAGNOSIS — M25579 Pain in unspecified ankle and joints of unspecified foot: Secondary | ICD-10-CM

## 2012-01-27 NOTE — Progress Notes (Signed)
Physical Therapy Treatment Patient Details  Name: JAYVIAN ESCOE MRN: [id number] Date of Birth: 1946/03/09  Today's Date: 01/27/2012 Time: 1050-1204 Time Calculation (min): 74 min Charges: 8 [place], 8 massage, 58 TE Visit#: 13  of 24   Re-eval: 02/24/12    Subjective: Symptoms/Limitations Symptoms: Patient reports the doctor gave him the ok to continue for 4 more weeks.  Patient reports numbness around his left knee cap today and tightness.   Pain Assessment Currently in Pain?: No/denies Pain Score: 0-No pain Pain Location: Knee Pain Orientation: Left Exercise/Treatments Stretches Passive Hamstring Stretch: 2 reps;30 seconds;Limitations ITB Stretch: 30 seconds;2 reps ITB Stretch Limitations: supine and S/L Aerobic Stationary Bike: 6' @ 3.0 Machines for Strengthening Cybex Knee Extension: 3 PL B up L LE lower 15 reps, 1x10 L knee only 2 PL Cybex Knee Flexion: 6.5 PL B LE  x 15, 1x15 3.5 PL left leg only Cybex Leg Press: 6 PL L LE only 2 x 10 Standing Lateral Step Up: 20 reps;Step Height: 6";Hand Hold: 1 Forward Step Up: 20 reps;Step Height: 8";Hand Hold: 0 Step Down: 20 reps;Step Height: 6";Hand Hold: 1 Functional Squat: Limitations Functional Squat Limitations: 12 reps picking up blue ball from 6" step.   Wall Squat: 10 seconds;2 sets;10 reps Wall Squat Limitations: 45 deg 5" Supine Straight Leg Raises: Limitations;15 reps Straight Leg Raises Limitations: 4# Knee Extension: PROM;3 sets;Limitations Knee Extension Limitations: 30" holds Sidelying Hip ABduction: 10 reps Hip ABduction Limitations: 4# Prone  Other Prone Exercises: PROM knee flex 3 x 30 sec holds  Modalities Modalities: Ultrasound Manual Therapy Manual Therapy: Massage Massage: STM to left lateral ITB and peri patellar region laterally x 8 mins Ultrasound Ultrasound Location: Left lateral knee and Left lateral peri patellar region Ultrasound Parameters: 3 MHz small sound head, 1.4 w/cm2, continuous, 8  mins  Physical Therapy Assessment and Plan Clinical Impression Statement: Patient's left knee is visibly more varus than his right knee.  Likely due to arthritic changes.  He continues to tolerate harder exercises well and has pain at end PROM.   PT Plan: Prescription came back for 4 more weeks of PT.  Increase bil knee cybex weight to 7 PL with both legs next session.  Do SLS and biodex LOS, DS for training next session.  Cut all weighted hamstring and quad exercises except for cybex machines, no need to continue since we are now doing the cybex).      Problem List Patient Active Problem List  Diagnoses  . TENOSYNOVITIS OF FOOT AND ANKLE  . Lateral epicondylitis/tennis elbow  . Trigger point of extremity  . Pain in joint, ankle and foot  . Abnormality of gait   PT - End of Session Activity Tolerance: Patient tolerated treatment well General Behavior During Session: [facility] for tasks performed Cognition: [facility] for tasks performed  Koleman Marling B. Morell Mears, PT, DPT  01/27/2012, 12:11 PM

## 2012-01-28 ENCOUNTER — Ambulatory Visit
Admission: RE | Admit: 2012-01-28 | Discharge: 2012-01-28 | Disposition: A | Discharge status: Home / Self Care | Payer: Medicare Other | Source: Ambulatory Visit | Attending: Otolaryngology | Admitting: Otolaryngology

## 2012-01-28 ENCOUNTER — Ambulatory Visit (HOSPITAL_COMMUNITY): Payer: Medicare Other | Admitting: Psychiatry

## 2012-01-28 DIAGNOSIS — I6509 Occlusion and stenosis of unspecified vertebral artery: Secondary | ICD-10-CM | POA: Diagnosis not present

## 2012-01-28 DIAGNOSIS — H93A9 Pulsatile tinnitus, unspecified ear: Secondary | ICD-10-CM

## 2012-01-28 MED ORDER — GADOBENATE DIMEGLUMINE 529 MG/ML IV SOLN
20.0000 mL | Freq: Once | INTRAVENOUS | Status: AC | PRN
  Administered 2012-01-28: 20 mL via INTRAVENOUS

## 2012-01-29 ENCOUNTER — Ambulatory Visit (HOSPITAL_COMMUNITY)
Admission: RE | Admit: 2012-01-29 | Discharge: 2012-01-29 | Disposition: A | Discharge status: Home / Self Care | Payer: Medicare Other | Source: Ambulatory Visit | Attending: Orthopedic Surgery | Admitting: Orthopedic Surgery

## 2012-01-29 DIAGNOSIS — M25669 Stiffness of unspecified knee, not elsewhere classified: Secondary | ICD-10-CM | POA: Diagnosis not present

## 2012-01-29 DIAGNOSIS — M6281 Muscle weakness (generalized): Secondary | ICD-10-CM | POA: Insufficient documentation

## 2012-01-29 DIAGNOSIS — M25569 Pain in unspecified knee: Secondary | ICD-10-CM | POA: Diagnosis not present

## 2012-01-29 DIAGNOSIS — M25579 Pain in unspecified ankle and joints of unspecified foot: Secondary | ICD-10-CM

## 2012-01-29 DIAGNOSIS — IMO0001 Reserved for inherently not codable concepts without codable children: Secondary | ICD-10-CM | POA: Insufficient documentation

## 2012-01-29 DIAGNOSIS — I10 Essential (primary) hypertension: Secondary | ICD-10-CM | POA: Diagnosis not present

## 2012-01-29 DIAGNOSIS — R269 Unspecified abnormalities of gait and mobility: Secondary | ICD-10-CM | POA: Diagnosis not present

## 2012-01-29 NOTE — Progress Notes (Signed)
Physical Therapy Treatment Patient Details  Name: DAEVION NAVARETTE MRN: [id number] Date of Birth: 25-May-1946  Today's Date: 01/29/2012 Time: 1103-1202 Time Calculation (min): 59 min Visit#: 14  of 24   Re-eval: 02/24/12  [place] 6'; There ex 38; manual 3  Subjective: Symptoms/Limitations Symptoms: Pt states his knee is tight. Pain Assessment Currently in Pain?: Yes Pain Location: Knee (dull ache) Pain Orientation: Left Pain Type: Chronic pain    Exercise/Treatments     Stretches Passive Hamstring Stretch: 2 reps;30 seconds;Limitations Aerobic Stationary Bike: 6' @ 3.0 Machines for Strengthening Cybex Knee Extension: 3 PL B up L LE lower 15 reps, 1x15 L knee only 2 PL Cybex Knee Flexion: 6.5 PL B LE x 15 Cybex Leg Press: 6 PL L LE only 2 x 10   Standing Lateral Step Up: 20 reps;Step Height: 6";Hand Hold: 1 Forward Step Up: 20 reps;Step Height: 8";Hand Hold: 0 Step Down: 20 reps;Step Height: 6";Hand Hold: 1 Functional Squat: 15 reps;Limitations Functional Squat Limitations: pick up blue ball from 6" step  Wall Squat: 10 seconds;2 sets Rocker Board: 2 minutes;Limitations   Supine Knee Extension: PROM;3 sets;Limitations    Modalities Modalities: Ultrasound Manual Therapy Manual Therapy: Other (comment) Other Manual Therapy: patella mobs as well as friction massage to quad tendon Ultrasound Ultrasound Location: Inferior aspect of L patella  Ultrasound Parameters: 3 MHz small head 1.2 pt c/o of pain; therapist turned [place] off and pt continute to complain of pain along lateral aspect of knee. Ultrasound Goals: Pain  Physical Therapy Assessment and Plan PT Assessment and Plan Clinical Impression Statement: Pt with decreased patellar motion; loosened by patellar mobs.  Attempted to increase cybex to 7 pl for ext but pt was unable to tolerate this. Rehab Potential: Good PT Plan: Continue with POC    Goals    Problem List Patient Active Problem List  Diagnoses  .  TENOSYNOVITIS OF FOOT AND ANKLE  . Lateral epicondylitis/tennis elbow  . Trigger point of extremity  . Pain in joint, ankle and foot  . Abnormality of gait    PT - End of Session Activity Tolerance: Patient tolerated treatment well General Behavior During Session: [facility] for tasks performed  RUSSELL,CINDY 01/29/2012, 12:12 PM

## 2012-02-01 ENCOUNTER — Ambulatory Visit (HOSPITAL_COMMUNITY)
Admission: RE | Admit: 2012-02-01 | Discharge: 2012-02-01 | Disposition: A | Discharge status: Home / Self Care | Payer: Medicare Other | Source: Ambulatory Visit | Attending: Internal Medicine | Admitting: Internal Medicine

## 2012-02-01 DIAGNOSIS — R269 Unspecified abnormalities of gait and mobility: Secondary | ICD-10-CM

## 2012-02-01 DIAGNOSIS — M25579 Pain in unspecified ankle and joints of unspecified foot: Secondary | ICD-10-CM

## 2012-02-01 NOTE — Progress Notes (Signed)
Physical Therapy Treatment Patient Details  Name: Zachary [name] MRN: [id number] Date of Birth: July 16, 1946  Today's Date: 02/01/2012 Time: 1057-1208 Time Calculation (min): 71 min Charges: 8 massage, 8 [place], 8 manual, 47 TE Visit#: 15  of 24   Re-eval: 02/24/12    Subjective: Symptoms/Limitations Symptoms: Wasn't hurting today until he did his HEP- a little sore.   Pain Assessment Currently in Pain?: No/denies Pain Score: 0-No pain  Exercise/Treatments Stretches Passive Hamstring Stretch: 2 reps;30 seconds;Limitations ITB Stretch: 30 seconds;2 reps ITB Stretch Limitations: supine and S/L Aerobic Elliptical: 5' 1.0 Machines for Strengthening Cybex Knee Extension: 3 PL B up L LE lower 15 reps, 1x15 L knee only 2 PL Cybex Knee Flexion: 6.5 PL B LE 2 x 15 Cybex Leg Press: 6 PL L LE only 2 x 15 Standing Lateral Step Up: Hand Hold: 0;15 reps;Step Height: 8" Forward Step Up: 15 reps;Hand Hold: 0;Step Height: 8" Step Down: 15 reps;Hand Hold: 0;Step Height: 8" Functional Squat Limitations: 12 repspick up blue ball from 6" step  Wall Squat: 10 seconds;2 sets;10 reps Rocker Board: 2 minutes;Limitations Rocker Board Limitations: R/L trying to keep board level with floor for balance.   SLS: R 22", L 11" Supine Patellar Mobs: medial mobs 3 x 30 second holds.   Knee Extension: PROM;3 sets;Limitations Knee Extension Limitations: 30" holds Sidelying Hip ABduction: 15 reps Hip ABduction Limitations: 4#  Modalities Modalities: Ultrasound Manual Therapy Manual Therapy: Massage Massage: STM to lateral knee L x 8 mins Ultrasound Ultrasound Location: left lateral knee peripatella Ultrasound Parameters: 3 MHz 1.4 w/cm 2 x 8 mins Ultrasound Goals: Pain  Physical Therapy Assessment and Plan Clinical Impression Statement: The patient continues to be tender laterally despite increases in ITB flexibility.  Medial patellar mobs and knee extension ROM produced most pain during the sesssion.     PT Plan: Continue elliptical increase time to 6' next session.  Stop STM to see if it has any affect on pain secondary to patient not really point tender and point was to massage ITB insertion, now ITB is flexible, so STM may no longer be necessary.      Problem List Patient Active Problem List  Diagnoses  . TENOSYNOVITIS OF FOOT AND ANKLE  . Lateral epicondylitis/tennis elbow  . Trigger point of extremity  . Pain in joint, ankle and foot  . Abnormality of gait   PT - End of Session Activity Tolerance: Patient tolerated treatment well General Behavior During Session: [facility] for tasks performed Cognition: [facility] for tasks performed PT Plan of Care PT Home Exercise Plan: patient reports compliance with new HEP Consulted and Agree with Plan of Care: Patient  Rollene Rotunda. Rishav Rockefeller, PT, DPT  02/01/2012, 12:16 PM

## 2012-02-02 ENCOUNTER — Encounter (HOSPITAL_COMMUNITY): Payer: Self-pay | Admitting: Psychology

## 2012-02-02 ENCOUNTER — Ambulatory Visit (INDEPENDENT_AMBULATORY_CARE_PROVIDER_SITE_OTHER): Payer: Medicare Other | Admitting: Psychology

## 2012-02-02 DIAGNOSIS — F331 Major depressive disorder, recurrent, moderate: Secondary | ICD-10-CM

## 2012-02-02 DIAGNOSIS — F411 Generalized anxiety disorder: Secondary | ICD-10-CM | POA: Diagnosis not present
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
## 2012-02-03 ENCOUNTER — Ambulatory Visit (HOSPITAL_COMMUNITY)
Admission: RE | Admit: 2012-02-03 | Discharge: 2012-02-03 | Disposition: A | Discharge status: Home / Self Care | Payer: Medicare Other | Source: Ambulatory Visit | Attending: Internal Medicine | Admitting: Internal Medicine

## 2012-02-03 DIAGNOSIS — R269 Unspecified abnormalities of gait and mobility: Secondary | ICD-10-CM

## 2012-02-03 DIAGNOSIS — M25579 Pain in unspecified ankle and joints of unspecified foot: Secondary | ICD-10-CM

## 2012-02-03 NOTE — Progress Notes (Signed)
Physical Therapy Treatment Patient Details  Name: Zachary [name] MRN: [id number] Date of Birth: [birth year]  Today's Date: 02/03/2012 Time: 7829-5621 Time Calculation (min): 70 min Visit#: 16  of 24   Re-eval: 02/24/12  Charge: therex:51 min [place] 3 min Ice 10 min  Subjective: Symptoms/Limitations Symptoms: I'm achey today, no real pain. Pain Assessment Currently in Pain?: No/denies  Objective:   Exercise/Treatments Stretches Passive Hamstring Stretch: 2 reps;30 seconds;Limitations Aerobic Elliptical: 6" @ L1 Machines for Strengthening Cybex Knee Extension: 3 PL B up L LE lower 15 reps, 3 Pl L knee only 15 reps Cybex Knee Flexion: 7 PL B LE 15 reps Cybex Leg Press: 7 PL L LE only 15 reps Standing Lateral Step Up: 20 reps;Hand Hold: 1;Step Height: 8" Forward Step Up: Left;20 reps;Hand Hold: 0;Step Height: 8" Step Down: Left;2 sets;10 reps;Hand Hold: 1;Step Height: 8" Functional Squat: Limitations Functional Squat Limitations: 15 repspick up blue ball from 6" step  Wall Squat: 10 seconds;2 sets;10 reps Rocker Board: 2 minutes;Limitations Rocker Board Limitations: R/L; A/P 2 min SLS: R 9", L 7" max of 4 Supine Patellar Mobs: medial mobs 3 x 30 second holds.   Knee Extension: PROM;3 sets;Limitations Knee Extension Limitations: 30" holds for extension Sidelying Hip ABduction: 15 reps Hip ABduction Limitations: 4#  Modalities Modalities: Cryotherapy;Ultrasound Cryotherapy Number Minutes Cryotherapy: 10 Minutes Cryotherapy Location: Knee Type of Cryotherapy: Ice pack Ultrasound Ultrasound Location: left lateral knee peripatella  Ultrasound Parameters: 3 MHz 1.4 w/cm 2 x 3 mins stopped early secondary to tenderness and increase pain Ultrasound Goals: Pain  Physical Therapy Assessment and Plan PT Assessment and Plan Clinical Impression Statement: Pt presented with decreased endurance following extra minute added to elliptical, required a rest break.  Very tender  laterally L knee at ITB insertion point, pt stated pain increase with [place] at end of session had to stop modality early. PT Plan: Assess pain following this session, continue with elliptical for endurance training continue improving functional strength and increase knee extension.    Goals    Problem List Patient Active Problem List  Diagnoses  . TENOSYNOVITIS OF FOOT AND ANKLE  . Lateral epicondylitis/tennis elbow  . Trigger point of extremity  . Pain in joint, ankle and foot  . Abnormality of gait    PT - End of Session Activity Tolerance: Patient limited by pain;Patient tolerated treatment well General Behavior During Session: [facility] for tasks performed Cognition: [facility] for tasks performed  [name] 02/03/2012, 12:22 PM

## 2012-02-04 ENCOUNTER — Encounter (HOSPITAL_COMMUNITY): Payer: Self-pay | Admitting: Psychiatry

## 2012-02-04 ENCOUNTER — Ambulatory Visit (INDEPENDENT_AMBULATORY_CARE_PROVIDER_SITE_OTHER): Payer: Medicare Other | Admitting: Psychiatry

## 2012-02-04 VITALS — Wt 246.0 lb

## 2012-02-04 DIAGNOSIS — F341 Dysthymic disorder: Secondary | ICD-10-CM | POA: Insufficient documentation

## 2012-02-04 DIAGNOSIS — F329 Major depressive disorder, single episode, unspecified: Secondary | ICD-10-CM

## 2012-02-04 NOTE — Progress Notes (Signed)
Patient came today for his followup appointment. He was last seen 2 weeks ago at that time he was feeling very depressed and anxious. He was not taking his Lexapro. Patient now taking his Lexapro 10 mg as advised. Patient is started to feel less depressed less anxious and less tearful. He is sleeping better. He also started counseling and seeing therapist this week. Overall he feel he is improving with the medication. He reported no side effects of medication. Patient was seen by her primary care physician yesterday for regular checkup. He has chronic back pain and he was given some medication yesterday. He also has CT scan of brain which was normal. He has notice more involvement in his life. He is less tearful. Though he continues to have some limitation going outside due to chronic back pain but overall his anxiety and energy level is improved.  Medical history Patient has history of back pain, hypertension, tinnitus, Obstructive sleep apnea, knee surgery and back surgery. Patient also has bladder cancer and carpal tunnel release.  Mental status examination Patient is casually dressed fairly groomed. He is less anxious and more cooperative. He maintained fair eye contact. His speech is slow but clear and coherent. His thought process is slow but logical. His attention and concentration is improved from the past. He described his mood is okay and his affect is bright from the past. He denies any active or passive suicidal thoughts or homicidal thoughts. He denies any auditory or visual hallucination. His fund of knowledge is okay. He's alert and oriented x3. His insight judgment and impulse control is okay.  Assessment Axis I Major depressive disorder, depressive disorder due to general medical condition Axis II deferred Axis III see medical history Axis IV mild to moderate Axis V 65-70  Plan At this time patient started to show improvement on Lexapro 10 mg. He is tolerating the does very well  without any side effects. He also started seeing therapist. I will continue Lexapro 10 mg, I have explained risks and benefits of medication. I reinforced compliance of the medication. I will consider increasing the dose on next appointment. I will see him again in 6 weeks. I recommended to call us if he has a question or concern about the medication or if he feel worsening of the symptoms.

## 2012-02-05 ENCOUNTER — Ambulatory Visit (HOSPITAL_COMMUNITY)
Admission: RE | Admit: 2012-02-05 | Discharge: 2012-02-05 | Disposition: A | Discharge status: Home / Self Care | Payer: Medicare Other | Source: Ambulatory Visit | Attending: Internal Medicine | Admitting: Internal Medicine

## 2012-02-05 DIAGNOSIS — M25579 Pain in unspecified ankle and joints of unspecified foot: Secondary | ICD-10-CM

## 2012-02-05 DIAGNOSIS — R269 Unspecified abnormalities of gait and mobility: Secondary | ICD-10-CM

## 2012-02-05 NOTE — Progress Notes (Signed)
Physical Therapy Treatment Patient Details  Name: Zachary [name] MRN: [id number] Date of Birth: 1946-09-17  Today's Date: 02/05/2012 Time: 6295-2841 Time Calculation (min): 78 min Visit#: 17  of 24   Re-eval: 02/24/12  charge:  There ex 52 IP x 15 Subjective: Symptoms/Limitations Symptoms: The MD found that I have a high heart rate.  I have to have a stress test. Pain Assessment Currently in Pain?: Yes Pain Score: 0-No pain Pain Location: Knee Pain Orientation: Left   Exercise/Treatments     Stretches Active Hamstring Stretch: 3 reps;30 seconds Passive Hamstring Stretch: 3 reps;30 seconds Aerobic Stationary Bike:  (upright bike  6 hole showing x 8 min) Machines for Strengthening Cybex Knee Extension: 3.5 PL B x 15 ; 3.5 PL L x 10 (3.5 x 15 B ; 3.5 x 15 ) Cybex Knee Flexion: 8PL B 15 rep. Cybex Leg Press: 8 PL  L LE x 15   Standing Functional Squat Limitations: pick up blue ball from 6" (2 reps only pt complained of pull in R low back with act.) Seated Long Arc Quad: 15 reps;Weights Long Arc Quad Weight: 7 lbs. Supine Short Arc Quad Sets: 15 reps;Limitations Short Arc Quad Sets Limitations: 71/2 Terminal Knee Extension: 15 reps Patellar Mobs:  (done) Knee Extension: PROM Knee Extension Limitations: 3x30" Sidelying Hip ABduction: 15 reps Hip ABduction Limitations: 5# Prone  Hamstring Curl: 15 reps Hamstring Curl Limitations: 5# Prone Knee Hang: 3 minutes;Weights Prone Knee Hang Weights (lbs): 5#  Modalities Modalities: Cryotherapy Cryotherapy Number Minutes Cryotherapy: 10 Minutes Cryotherapy Location: Knee Type of Cryotherapy: Ice pack  Physical Therapy Assessment and Plan PT Assessment and Plan Rehab Potential: Good PT Plan: continue to see pt.    Goals    Problem List Patient Active Problem List  Diagnoses  . TENOSYNOVITIS OF FOOT AND ANKLE  . Lateral epicondylitis/tennis elbow  . Trigger point of extremity  . Pain in joint, ankle and foot  .  Abnormality of gait  . Depression    PT - End of Session Activity Tolerance: Patient tolerated treatment well General Behavior During Session: [facility] for tasks performed Cognition: [facility] for tasks performed  RUSSELL,CINDY 02/05/2012, 12:30 PM

## 2012-02-08 ENCOUNTER — Ambulatory Visit (HOSPITAL_COMMUNITY)
Admission: RE | Admit: 2012-02-08 | Discharge: 2012-02-08 | Disposition: A | Discharge status: Home / Self Care | Payer: Medicare Other | Source: Ambulatory Visit | Attending: Internal Medicine | Admitting: Internal Medicine

## 2012-02-08 DIAGNOSIS — M25579 Pain in unspecified ankle and joints of unspecified foot: Secondary | ICD-10-CM

## 2012-02-08 DIAGNOSIS — R269 Unspecified abnormalities of gait and mobility: Secondary | ICD-10-CM

## 2012-02-08 NOTE — Progress Notes (Signed)
Physical Therapy Treatment Patient Details  Name: Zachary [name] MRN: [id number] Date of Birth: May 29, 1946  Today's Date: 02/08/2012 Time: 1100-1200 Time Calculation (min): 60 min Charges: 60 TE Visit#: 18  of 24   Re-eval: 02/24/12 Assessment Next MD Visit: 01/25/12  Subjective: Symptoms/Limitations Symptoms: "My knee feels good today" Just a small ache Pain Assessment Currently in Pain?: Yes Pain Score:   1 Pain Location: Knee Pain Orientation: Left Pain Type: Chronic pain  Exercise/Treatments Stretches Active Hamstring Stretch: 3 reps;30 seconds Passive Hamstring Stretch: 3 reps;30 seconds ITB Stretch: 30 seconds;2 reps ITB Stretch Limitations: supine and S/L Aerobic Stationary Bike: 6' @ 5.0 Machines for Strengthening Cybex Knee Extension: 5 PL B x 15 ; 3.5 PL L x 10 (3.5 x 15 B ; 3.5 x 15 ) Cybex Knee Flexion: 8PL B  2 x15 rep. Cybex Leg Press: 8 PL  L LE x 15 Standing Lateral Step Up: 20 reps;Hand Hold: 1;Step Height: 8" Forward Step Up: Left;20 reps;Hand Hold: 0;Step Height: 8" Step Down: Left;2 sets;10 reps;Hand Hold: 1;Step Height: 8" Functional Squat: Limitations Functional Squat Limitations: pick up blue ball from 8" 1 x 10 (2 reps only pt complained of pull in R low back with act.) Wall Squat: 10 seconds;10 reps;1 set SLS: R 15", L 8" max of 4 Seated Long Arc Quad:  (d/c secondary to doing cybex knee ext) Supine Short Arc Quad Sets:  (d/c secondary to doing cybex) Knee Extension: PROM Knee Extension Limitations: 3x30" Prone  Hamstring Curl:  (d/c secondary to doing cybex) Other Prone Exercises: PROM knee flex 3 x 30 sec holds  Physical Therapy Assessment and Plan Clinical Impression Statement: Patient's HR got up to 155 with exercise (standing).  His target HR should be 93-124.  Please monitor next session, especially as he is doing more cardio type activities (Elliptical, step up).   PT Plan: continue with current POC, add Elliptical again for  strength and cardio training - monitor HR to make sure we don't get too hight (target range is 93-124).  D/C seated knee ext, prone hamstring curls and SAQs secondary to doing cybex machines makes them redundant.  Try going without [place] and massage since patient is feeling better and try SLS first after the bike (I think it gets worse when he fatigues and it frustrates him).      Problem List Patient Active Problem List  Diagnoses  . TENOSYNOVITIS OF FOOT AND ANKLE  . Lateral epicondylitis/tennis elbow  . Trigger point of extremity  . Pain in joint, ankle and foot  . Abnormality of gait  . Depression   PT - End of Session Activity Tolerance: Patient tolerated treatment well General Behavior During Session: [facility] for tasks performed Cognition: [facility] for tasks performed PT Plan of Care PT Home Exercise Plan: no new.  Instructed patient to start thinking about how he is going to continue a fitness and knee strengthening program at d/c  Spickard B. Brittne Kawasaki, PT, DPT  02/08/2012, 12:03 PM

## 2012-02-09 ENCOUNTER — Encounter: Payer: Self-pay | Admitting: Cardiology

## 2012-02-09 ENCOUNTER — Ambulatory Visit (INDEPENDENT_AMBULATORY_CARE_PROVIDER_SITE_OTHER): Payer: Medicare Other | Admitting: Cardiology

## 2012-02-09 VITALS — BP 135/79 | HR 95 | Resp 16 | Ht 72.0 in | Wt 243.0 lb

## 2012-02-09 DIAGNOSIS — R072 Precordial pain: Secondary | ICD-10-CM | POA: Diagnosis not present

## 2012-02-09 DIAGNOSIS — E782 Mixed hyperlipidemia: Secondary | ICD-10-CM

## 2012-02-09 DIAGNOSIS — R0602 Shortness of breath: Secondary | ICD-10-CM | POA: Diagnosis not present

## 2012-02-09 DIAGNOSIS — R0600 Dyspnea, unspecified: Secondary | ICD-10-CM | POA: Insufficient documentation

## 2012-02-09 DIAGNOSIS — I10 Essential (primary) hypertension: Secondary | ICD-10-CM | POA: Diagnosis not present

## 2012-02-09 NOTE — Assessment & Plan Note (Addendum)
Patient reports progressive symptoms over the last few years, mainly exertional and associated with increased heart rate - not specifically palpitations. ECG shows RBBB, otherwise nonspecific. Cardiac risk factors include hypertension, hyperlipidemia, obesity with OSA, age and gender. He has had no recent ischemic testing. We discussed the matter and will plan an echocardiogram (possible S3 on exam) to assess LVEF as well as a Lexiscan Myoview (has knee pain with history of arthoscopic surgery). Office followup arranged to review.
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
## 2012-02-09 NOTE — Assessment & Plan Note (Signed)
Current medical regimen is reasonable.

## 2012-02-09 NOTE — Patient Instructions (Signed)
**Note De-identified  Obfuscation** Your physician recommends that you continue on your current medications as directed. Please refer to the Current Medication list given to you today.  Your physician has requested that you have an echocardiogram. Echocardiography is a painless test that uses sound waves to create images of your heart. It provides your doctor with information about the size and shape of your heart and how well your heart's chambers and valves are working. This procedure takes approximately one hour. There are no restrictions for this procedure.  Your physician has requested that you have a lexiscan myoview. For further information please visit [email or web address]. Please follow instruction sheet, as given.  Your physician recommends that you schedule a follow-up appointment in: 3 weeks   

## 2012-02-09 NOTE — Assessment & Plan Note (Signed)
Sporatic and nonexertional. Testing planned as outlined above.

## 2012-02-09 NOTE — Assessment & Plan Note (Signed)
Per history - LDL from 10/12 looked good.

## 2012-02-10 ENCOUNTER — Ambulatory Visit (HOSPITAL_COMMUNITY)
Admission: RE | Admit: 2012-02-10 | Discharge: 2012-02-10 | Disposition: A | Discharge status: Home / Self Care | Payer: Medicare Other | Source: Ambulatory Visit | Attending: Internal Medicine | Admitting: Internal Medicine

## 2012-02-10 DIAGNOSIS — M25579 Pain in unspecified ankle and joints of unspecified foot: Secondary | ICD-10-CM

## 2012-02-10 DIAGNOSIS — R269 Unspecified abnormalities of gait and mobility: Secondary | ICD-10-CM

## 2012-02-10 NOTE — Progress Notes (Signed)
Physical Therapy Treatment Patient Details  Name: Zachary [name] MRN: [id number] Date of Birth: January 05, 1946  Today's Date: 02/10/2012 Time: 4782-9562 Time Calculation (min): 65 min Visit#: 19  of 24   Re-eval: 02/24/12  Charge: therex: 55 min  Subjective: Symptoms/Limitations Symptoms: I'm feeling really good, had no pain this morning until I reached the incline coming in today.  1/10 lateral left knee.  Pt requested to hold on elliptical due to having stress test complete next week, agreed to reciprical bike as warm up. Pain Assessment Currently in Pain?: Yes Pain Score:   1 Pain Location: Knee Pain Orientation: Left;Lateral  Objective:   Exercise/Treatments Stretches Active Hamstring Stretch: 3 reps;30 seconds ITB Stretch: 2 reps;30 seconds ITB Stretch Limitations: supine Aerobic Stationary Bike: 6' @ 6.0 Machines for Strengthening Cybex Knee Extension: 5 PL B x 15 ; 3.5 PL L x 15 Cybex Knee Flexion: 8PL B 15 rep. Cybex Leg Press: 8 PL  L LE x 15 Standing Lateral Step Up: 20 reps;Hand Hold: 1;Step Height: 8" Forward Step Up: Left;20 reps;Hand Hold: 0;Step Height: 8" Step Down: Left;2 sets;10 reps;Hand Hold: 1;Step Height: 8" Functional Squat Limitations: pick up blue ball from 8" 1 x 10 Wall Squat: 10 seconds;10 reps;1 set SLS: R 29", L 28" max of 3 Supine Quad Sets: 10 reps;Limitations Heel Slides: 10 reps;Limitations Knee Extension: PROM Knee Extension Limitations: 3x30" Knee Flexion: PROM;3 sets;Limitations Knee Flexion Limitations: 3x 30"   Physical Therapy Assessment and Plan PT Assessment and Plan Clinical Impression Statement: Pt's HR up to 139 with standing exercise 40 minutes into session.  HR 105 at end of session following mat activities.  Pt has met his cap with medicare, pt explained medicare's cap policy and paperwork complete  to request further sessions.  Pt was given HEP worksheet to complete while awaiting approval to continue OPPT.   PT Plan:  Awaiting prior authorization thru Medicare.  When resumed continue with functional strengthening and cardio training, begin elliptical monitoring HR to make sure it doesn't get too high (target range is 93-120), SLS following aerobic before pt gets too fatigued, continue with cybex machines, stairwell, PROM.    Goals    Problem List Patient Active Problem List  Diagnoses  . TENOSYNOVITIS OF FOOT AND ANKLE  . Lateral epicondylitis/tennis elbow  . Trigger point of extremity  . Pain in joint, ankle and foot  . Abnormality of gait  . Depression  . Shortness of breath  . Precordial pain  . Essential hypertension, benign  . Mixed hyperlipidemia    PT - End of Session Activity Tolerance: Patient tolerated treatment well General Behavior During Session: [facility] for tasks performed Cognition: [facility] for tasks performed  Zachary [name] 02/10/2012, 12:13 PM

## 2012-02-11 DIAGNOSIS — M6200 Separation of muscle (nontraumatic), unspecified site: Secondary | ICD-10-CM | POA: Diagnosis not present

## 2012-02-12 ENCOUNTER — Ambulatory Visit (HOSPITAL_COMMUNITY)
Admission: RE | Admit: 2012-02-12 | Discharge: 2012-02-12 | Disposition: A | Discharge status: Home / Self Care | Payer: Medicare Other | Source: Ambulatory Visit | Attending: Internal Medicine | Admitting: Internal Medicine

## 2012-02-12 NOTE — Progress Notes (Signed)
Physical Therapy Treatment Patient Details  Name: SUDEEP SCHEIBEL MRN: [id number] Date of Birth: 12/17/1946  Today's Date: 02/12/2012 Time: 1100-1200 Time Calculation (min): 60 min Visit#: 20  of 24   Re-eval: 02/24/12 Charges:  therex 50'   Subjective: Symptoms/Limitations Symptoms: Pt. reports his knee is not hurting; states he feels a little better but is getting a stress test and [place] of his heart next Thursday. Pain Assessment Currently in Pain?: No/denies   Exercise/Treatments Aerobic Stationary Bike: 7'@ 6.0 Machines for Strengthening Cybex Knee Extension: 5 PL B x 15 ; 3.5 PL L 2 sets 15reps Cybex Knee Flexion: 8PL B 2 sets 15 rep. Cybex Leg Press: 8 PL  L LE  2sets 15 reps Standing Lateral Step Up: 20 reps;Hand Hold: 1;Step Height: 8" Forward Step Up: Left;20 reps;Hand Hold: 0;Step Height: 8" Step Down: Left;2 sets;10 reps;Hand Hold: 1;Step Height: 8" Functional Squat Limitations: pick up blue ball from 8" 1 x 10 Wall Squat: 10 seconds;10 reps;1 set Wall Squat Limitations: 45 deg 5" SLS: R:  20", L:41" Supine Quad Sets: 10 reps;Limitations Heel Slides: 10 reps;Limitations Knee Extension: PROM Knee Extension Limitations: 3x30" Knee Flexion: PROM;3 sets;Limitations Knee Flexion Limitations: 3x 30"   Physical Therapy Assessment and Plan PT Assessment and Plan Clinical Impression Statement: Pt. did well with therex today, only taking 2 short rest breaks secondary to SOB.  Improved posture/mechanics with ball squats today. PT Plan: Continue X 3 more visits and then D/C to HEP per PT orders.     Problem List Patient Active Problem List  Diagnoses  . TENOSYNOVITIS OF FOOT AND ANKLE  . Lateral epicondylitis/tennis elbow  . Trigger point of extremity  . Pain in joint, ankle and foot  . Abnormality of gait  . Depression  . Shortness of breath  . Precordial pain  . Essential hypertension, benign  . Mixed hyperlipidemia    PT - End of Session Activity  Tolerance: Patient tolerated treatment well General Behavior During Session: [facility] for tasks performed Cognition: [facility] for tasks performed  Jarrius Huaracha B. Bascom Levels, PTA 02/12/2012, 12:01 PM

## 2012-02-15 ENCOUNTER — Ambulatory Visit (HOSPITAL_COMMUNITY)
Admission: RE | Admit: 2012-02-15 | Discharge: 2012-02-15 | Disposition: A | Discharge status: Home / Self Care | Payer: Medicare Other | Source: Ambulatory Visit | Attending: Internal Medicine | Admitting: Internal Medicine

## 2012-02-15 DIAGNOSIS — M25579 Pain in unspecified ankle and joints of unspecified foot: Secondary | ICD-10-CM

## 2012-02-15 DIAGNOSIS — R269 Unspecified abnormalities of gait and mobility: Secondary | ICD-10-CM

## 2012-02-15 NOTE — Progress Notes (Signed)
Physical Therapy Treatment Patient Details  Name: Zachary [name] MRN: [id number] Date of Birth: 1946-06-02  Today's Date: 02/15/2012 Time: 1100-1210 Time Calculation (min): 70 min Visit#: 21  of 24   Re-eval: 02/19/12 Charges:  therex 48' , manual 10'    Subjective: Symptoms/Limitations Symptoms: Pt. states he gets stiff/sore, usually on days between therapy sessions.  Pt. reports he currently has no pain in his knee. Pain Assessment Currently in Pain?: No/denies   Exercise/Treatments Aerobic Stationary Bike: 8'@ 6.0 Machines for Strengthening Cybex Knee Extension: 5 PL B 2x 15 ; 3 PL L 2 sets 15reps Cybex Knee Flexion: 8PL B 2 sets 15 rep. Cybex Leg Press: 8 PL  L LE  2sets 15 reps Standing Lateral Step Up: 20 reps Forward Step Up: Left;20 reps;Hand Hold: 0;Step Height: 8" Step Down: Left;2 sets;10 reps;Hand Hold: 1;Step Height: 8" Functional Squat Limitations: pick up blue ball from 8" 1 x 10 Wall Squat: 10 seconds;10 reps;1 set Wall Squat Limitations: 45 deg 5" SLS: R:  8", L:14" Supine Quad Sets: 10 reps;Limitations Quad Sets Limitations: prior to PROM Heel Slides: 10 reps;Limitations Heel Slides Limitations: prior to PROM Knee Extension: PROM Knee Extension Limitations: 3x30" Knee Flexion: PROM;3 sets;Limitations Knee Flexion Limitations: 3x 30" Prone  Prone Knee Hang: 3 minutes;Weights Prone Knee Hang Weights (lbs): 5# with STM posterior knee/hamstring     Physical Therapy Assessment and Plan PT Assessment and Plan Clinical Impression Statement: Pt. reported his knee felt great after therapy.  Added STM to posterior knee with extension/knee hang.  Tightness decreased post. knee into hamstring area. PT Plan: Continue X 2 more visits then D/C to HEP per PT orders.     Djibril Glogowski B. Bascom Levels, PTA 02/15/2012, 12:19 PM

## 2012-02-16 ENCOUNTER — Other Ambulatory Visit: Payer: Self-pay | Admitting: *Deleted

## 2012-02-17 ENCOUNTER — Ambulatory Visit (HOSPITAL_COMMUNITY)
Admission: RE | Admit: 2012-02-17 | Discharge: 2012-02-17 | Disposition: A | Discharge status: Home / Self Care | Payer: Medicare Other | Source: Ambulatory Visit | Attending: Internal Medicine | Admitting: Internal Medicine

## 2012-02-17 DIAGNOSIS — R269 Unspecified abnormalities of gait and mobility: Secondary | ICD-10-CM

## 2012-02-17 DIAGNOSIS — M25579 Pain in unspecified ankle and joints of unspecified foot: Secondary | ICD-10-CM

## 2012-02-17 NOTE — Progress Notes (Signed)
Physical Therapy Treatment Patient Details  Name: Zachary [name] MRN: [id number] Date of Birth: August 01, 1946  Today's Date: 02/17/2012 Time: 1100-1202 Time Calculation (min): 62 min Visit#: 22  of 24   Re-eval: 02/19/12  Charge: therex 54 min  Subjective: Symptoms/Limitations Symptoms: Pain free today.  Have stress test tomorrow.  Been a little light headed today.  Pt lost 7 lbs in 3 weeks by changing diet. Pain Assessment Currently in Pain?: No/denies  Objective:   Exercise/Treatments Aerobic Stationary Bike: 8'@ 8.0 Machines for Strengthening Cybex Knee Extension: 3.5 Pl L LE only 2x 15 Cybex Knee Flexion: 8PL B 2 sets 15 rep. Cybex Leg Press: 8 PL  L LE  2sets 15 reps Standing Lateral Step Up: Left;Hand Hold: 1;Limitations Lateral Step Up Limitations: 12 reps BOSU Forward Step Up: Left;Hand Hold: 1;Limitations Forward Step Up Limitations: 12 reps BOSU Step Down: Left;20 reps;Hand Hold: 1;Step Height: 8" Functional Squat: Limitations Functional Squat Limitations: pick up blue ball from 8" 1 x 15 Wall Squat: 10 reps;10 seconds;Limitations Wall Squat Limitations: 45 degrees SLS: R 18", L 24" max of 3 Supine Quad Sets: 10 reps;Limitations Heel Slides: 10 reps;Limitations Heel Slides Limitations: prior to PROM Knee Extension: PROM Knee Extension Limitations: 3x30" Knee Flexion: PROM;3 sets;Limitations Knee Flexion Limitations: 3x 30" Prone  Prone Knee Hang: 3 minutes;Weights Prone Knee Hang Weights (lbs): 5# with STM posterior knee/hamstring Other Prone Exercises: PROM knee flexion 3x 30"  Physical Therapy Assessment and Plan PT Assessment and Plan Clinical Impression Statement: Progressed step ups/lateral step up to BOSU, pt able to complete without difficulty with 1 HHA.  Continued STM to posterior knee with extension, tightness reduced posterior knee.  No ice at end of session, pt stated he felt good. PT Plan: Continue x 1 more session then D/C to HEP per PT orders.     Goals    Problem List Patient Active Problem List  Diagnoses  . TENOSYNOVITIS OF FOOT AND ANKLE  . Lateral epicondylitis/tennis elbow  . Trigger point of extremity  . Pain in joint, ankle and foot  . Abnormality of gait  . Depression  . Shortness of breath  . Precordial pain  . Essential hypertension, benign  . Mixed hyperlipidemia    PT - End of Session Activity Tolerance: Patient tolerated treatment well General Behavior During Session: [facility] for tasks performed Cognition: [facility] for tasks performed  [name], PTA 02/17/2012, 12:07 PM

## 2012-02-18 ENCOUNTER — Ambulatory Visit (HOSPITAL_COMMUNITY)
Admission: RE | Admit: 2012-02-18 | Discharge: 2012-02-18 | Disposition: A | Discharge status: Home / Self Care | Payer: Medicare Other | Source: Ambulatory Visit | Attending: Cardiology | Admitting: Cardiology

## 2012-02-18 ENCOUNTER — Encounter (HOSPITAL_COMMUNITY): Payer: Self-pay

## 2012-02-18 ENCOUNTER — Encounter (HOSPITAL_COMMUNITY): Payer: Self-pay | Admitting: Cardiology

## 2012-02-18 ENCOUNTER — Encounter (HOSPITAL_COMMUNITY)
Admission: RE | Admit: 2012-02-18 | Discharge: 2012-02-18 | Disposition: A | Discharge status: Home / Self Care | Payer: Medicare Other | Source: Ambulatory Visit | Attending: Cardiology | Admitting: Cardiology

## 2012-02-18 ENCOUNTER — Encounter: Payer: Self-pay | Admitting: *Deleted

## 2012-02-18 ENCOUNTER — Encounter (INDEPENDENT_AMBULATORY_CARE_PROVIDER_SITE_OTHER): Payer: Medicare Other

## 2012-02-18 DIAGNOSIS — R0602 Shortness of breath: Secondary | ICD-10-CM

## 2012-02-18 DIAGNOSIS — I10 Essential (primary) hypertension: Secondary | ICD-10-CM | POA: Insufficient documentation

## 2012-02-18 DIAGNOSIS — I517 Cardiomegaly: Secondary | ICD-10-CM | POA: Diagnosis not present

## 2012-02-18 DIAGNOSIS — R0989 Other specified symptoms and signs involving the circulatory and respiratory systems: Secondary | ICD-10-CM | POA: Insufficient documentation

## 2012-02-18 DIAGNOSIS — E785 Hyperlipidemia, unspecified: Secondary | ICD-10-CM | POA: Insufficient documentation

## 2012-02-18 DIAGNOSIS — R072 Precordial pain: Secondary | ICD-10-CM

## 2012-02-18 DIAGNOSIS — R079 Chest pain, unspecified: Secondary | ICD-10-CM | POA: Diagnosis not present

## 2012-02-18 DIAGNOSIS — R9431 Abnormal electrocardiogram [ECG] [EKG]: Secondary | ICD-10-CM | POA: Insufficient documentation

## 2012-02-18 DIAGNOSIS — R0609 Other forms of dyspnea: Secondary | ICD-10-CM | POA: Diagnosis not present

## 2012-02-18 MED ORDER — TECHNETIUM TC 99M TETROFOSMIN IV KIT
30.0000 | PACK | Freq: Once | INTRAVENOUS | Status: AC | PRN
  Administered 2012-02-18: 29.7 via INTRAVENOUS

## 2012-02-18 MED ORDER — TECHNETIUM TC 99M TETROFOSMIN IV KIT
10.0000 | PACK | Freq: Once | INTRAVENOUS | Status: AC | PRN
  Administered 2012-02-18: 9.9 via INTRAVENOUS

## 2012-02-18 NOTE — Progress Notes (Signed)
*  PRELIMINARY RESULTS* Echocardiogram 2D Echocardiogram has been performed.  Zachary [name] 02/18/2012, 12:07 PM

## 2012-02-19 ENCOUNTER — Ambulatory Visit (HOSPITAL_COMMUNITY)
Admission: RE | Admit: 2012-02-19 | Discharge: 2012-02-19 | Disposition: A | Discharge status: Home / Self Care | Payer: Medicare Other | Source: Ambulatory Visit | Attending: Internal Medicine | Admitting: Internal Medicine

## 2012-02-19 DIAGNOSIS — M25579 Pain in unspecified ankle and joints of unspecified foot: Secondary | ICD-10-CM

## 2012-02-19 DIAGNOSIS — R269 Unspecified abnormalities of gait and mobility: Secondary | ICD-10-CM

## 2012-02-19 NOTE — Progress Notes (Signed)
Physical Therapy Evaluation  Patient Details  Name: Zachary [name] MRN: [id number] Date of Birth: November 05, 1946  Today's Date: 02/19/2012 Time: 5621-3086 Time Calculation (min): 45 min  Visit#: 23  of 24   Re-eval:   Assessment Next MD Visit: 2/28 Charge: therex: 38 min ROM Measurement 1 unit  Subjective Symptoms/Limitations Symptoms: Pain free just sinuses, have not gotten results back. Pain Assessment Currently in Pain?: No/denies  Objective:   Assessment LLE AROM (degrees) Left Knee Extension 0-130: 2  Left Knee Flexion 0-140: 115  LLE PROM (degrees) Left Hip Flexion 0-125: 117  Exercise/Treatments Aerobic Stationary Bike: 8'@ 8.0 Standing SLS: R21", L 36" max of 3 Supine Quad Sets: 10 reps;Limitations Heel Slides: 10 reps;Limitations Heel Slides Limitations: prior to PROM Knee Extension: PROM Knee Extension Limitations: 3x30" Knee Flexion: PROM;3 sets;Limitations Knee Flexion Limitations: 3x 30" Prone  Prone Knee Hang: 5 minutes;Weights Prone Knee Hang Weights (lbs): 10 with STM posterior knee/hamstring    Physical Therapy Assessment and Plan PT Assessment and Plan Clinical Impression Statement: Reassessment complete; 2/3 STGs met, 4/5 LTGs met.  ROM goal progressing with AROM lacking 2 degrees extension -115 flexion.  Pt with pain levels less than 3/10 with improved QOL, SLS improved BLE, pt able to ambulate up and down stairs reciprocally with no handrail assistance, strength 5/5.  Pt plans on joining the [employer] to continue overall functional strengthening, pt given recommendation print out of activities to complete there and advised to continue his current HEP.  (print out included elliptical/bike for aerobic, weight machines for quad, hamstring, leg press, and 4 way hip, continue stair training, quad sets/heel slides for ROM.) PT Plan: D/C to HEP per PT order.    Goals Home Exercise Program Pt will Perform Home Exercise Program: Independently PT Goal:  Perform Home Exercise Program - Progress: Met PT Short Term Goals Time to Complete Short Term Goals: 4 weeks PT Short Term Goal 1: Decrease daily pain in left knee to less than or equal to 3/10 to show improved QOL and activity tolerance PT Short Term Goal 1 - Progress: Met PT Short Term Goal 2: Increase knee ROM by at least 5 degrees actively in both directions (flex and ext) PT Short Term Goal 2 - Progress: Partly met PT Short Term Goal 3: increase knee strength to 5/5 to show improved strength. PT Short Term Goal 3 - Progress: Met PT Long Term Goals Time to Complete Long Term Goals: 4 weeks PT Long Term Goal 1: Decrease daily pain level to 0/10 most days with max weekly pain level of 3/10.  PT Long Term Goal 1 - Progress: Met PT Long Term Goal 2: Increase knee ROM to at least 5-120 degrees of flexion  PT Long Term Goal 2 - Progress: Partly met (lacking 2-115) Long Term Goal 3: Patient is able to take stairs reciprocally with one rail up and down one flight of stairs to show increased strength and increased mobility  Long Term Goal 3 Progress: Met Long Term Goal 4: Patient will be able to hold SLS bil for at least 15 seconds each to show improved balance and decreased risk of falls Long Term Goal 4 Progress: Met PT Long Term Goal 5: Patient will be able to walk both indoor and outdoor surfaces without an assistive device with normal heel to toe gait pattern to show more normalized gait. Long Term Goal 5 Progress: Met  Problem List Patient Active Problem List  Diagnoses  . TENOSYNOVITIS OF FOOT AND ANKLE  .  Lateral epicondylitis/tennis elbow  . Trigger point of extremity  . Pain in joint, ankle and foot  . Abnormality of gait  . Depression  . Shortness of breath  . Precordial pain  . Essential hypertension, benign  . Mixed hyperlipidemia    PT - End of Session Activity Tolerance: Patient tolerated treatment well General Behavior During Session: [facility] for tasks  performed Cognition: [facility] for tasks performed  [name], PTA 02/19/2012, 11:55 AM

## 2012-02-22 ENCOUNTER — Ambulatory Visit (HOSPITAL_COMMUNITY): Payer: Medicare Other | Admitting: Physical Therapy

## 2012-02-24 ENCOUNTER — Encounter (HOSPITAL_COMMUNITY): Payer: Self-pay | Admitting: Physical Therapy

## 2012-02-24 ENCOUNTER — Ambulatory Visit (HOSPITAL_COMMUNITY): Payer: Medicare Other

## 2012-02-26 ENCOUNTER — Ambulatory Visit (HOSPITAL_COMMUNITY): Payer: Medicare Other

## 2012-02-29 ENCOUNTER — Ambulatory Visit (HOSPITAL_COMMUNITY): Payer: Medicare Other | Admitting: Physical Therapy

## 2012-02-29 ENCOUNTER — Ambulatory Visit (INDEPENDENT_AMBULATORY_CARE_PROVIDER_SITE_OTHER): Payer: Medicare Other | Admitting: Cardiology

## 2012-02-29 ENCOUNTER — Encounter: Payer: Self-pay | Admitting: Cardiology

## 2012-02-29 VITALS — BP 131/78 | HR 97 | Resp 18 | Ht 72.0 in | Wt 236.0 lb

## 2012-02-29 DIAGNOSIS — R072 Precordial pain: Secondary | ICD-10-CM | POA: Diagnosis not present

## 2012-02-29 DIAGNOSIS — E782 Mixed hyperlipidemia: Secondary | ICD-10-CM

## 2012-02-29 DIAGNOSIS — I10 Essential (primary) hypertension: Secondary | ICD-10-CM | POA: Diagnosis not present
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
## 2012-02-29 NOTE — Patient Instructions (Signed)
**Note De-identified  Obfuscation** Your physician recommends that you continue on your current medications as directed. Please refer to the Current Medication list given to you today.  Your physician recommends that you schedule a follow-up appointment in: as needed  

## 2012-02-29 NOTE — Assessment & Plan Note (Signed)
Improved. Recent cardiac testing reviewed and reassuring. We discussed diet and exercise, risk factor modification. Keep followup with Dr. Margo Aye.

## 2012-02-29 NOTE — Assessment & Plan Note (Signed)
Optimal LDL goal under 100.

## 2012-02-29 NOTE — Assessment & Plan Note (Signed)
No change to current regimen. 

## 2012-03-01 ENCOUNTER — Ambulatory Visit (INDEPENDENT_AMBULATORY_CARE_PROVIDER_SITE_OTHER): Payer: Medicare Other | Admitting: Psychology

## 2012-03-01 DIAGNOSIS — F331 Major depressive disorder, recurrent, moderate: Secondary | ICD-10-CM

## 2012-03-01 DIAGNOSIS — IMO0002 Reserved for concepts with insufficient information to code with codable children: Secondary | ICD-10-CM | POA: Diagnosis not present

## 2012-03-01 DIAGNOSIS — M1710 Unilateral primary osteoarthritis, unspecified knee: Secondary | ICD-10-CM | POA: Diagnosis not present

## 2012-03-01 DIAGNOSIS — F411 Generalized anxiety disorder: Secondary | ICD-10-CM | POA: Diagnosis not present

## 2012-03-01 DIAGNOSIS — S83289A Other tear of lateral meniscus, current injury, unspecified knee, initial encounter: Secondary | ICD-10-CM | POA: Diagnosis not present
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
## 2012-03-02 ENCOUNTER — Ambulatory Visit (HOSPITAL_COMMUNITY): Payer: Medicare Other

## 2012-03-03 ENCOUNTER — Ambulatory Visit (INDEPENDENT_AMBULATORY_CARE_PROVIDER_SITE_OTHER): Payer: Medicare Other | Admitting: Otolaryngology

## 2012-03-03 DIAGNOSIS — M1990 Unspecified osteoarthritis, unspecified site: Secondary | ICD-10-CM | POA: Diagnosis not present

## 2012-03-03 DIAGNOSIS — I10 Essential (primary) hypertension: Secondary | ICD-10-CM | POA: Diagnosis not present

## 2012-03-03 DIAGNOSIS — J310 Chronic rhinitis: Secondary | ICD-10-CM

## 2012-03-03 DIAGNOSIS — H9319 Tinnitus, unspecified ear: Secondary | ICD-10-CM | POA: Diagnosis not present

## 2012-03-03 DIAGNOSIS — E785 Hyperlipidemia, unspecified: Secondary | ICD-10-CM | POA: Diagnosis not present

## 2012-03-03 DIAGNOSIS — H903 Sensorineural hearing loss, bilateral: Secondary | ICD-10-CM | POA: Diagnosis not present

## 2012-03-03 DIAGNOSIS — J309 Allergic rhinitis, unspecified: Secondary | ICD-10-CM | POA: Diagnosis not present

## 2012-03-03 DIAGNOSIS — R7301 Impaired fasting glucose: Secondary | ICD-10-CM | POA: Diagnosis not present

## 2012-03-04 ENCOUNTER — Ambulatory Visit (HOSPITAL_COMMUNITY): Payer: Medicare Other

## 2012-03-17 ENCOUNTER — Ambulatory Visit (INDEPENDENT_AMBULATORY_CARE_PROVIDER_SITE_OTHER): Payer: Medicare Other | Admitting: Psychiatry

## 2012-03-17 ENCOUNTER — Encounter (HOSPITAL_COMMUNITY): Payer: Self-pay | Admitting: Psychiatry

## 2012-03-17 VITALS — Wt 235.0 lb

## 2012-03-17 DIAGNOSIS — F329 Major depressive disorder, single episode, unspecified: Secondary | ICD-10-CM

## 2012-03-17 NOTE — Progress Notes (Signed)
Chief complaint Medication management and followup.  History presenting illness. Patient is 66 year old Caucasian male who came for his followup appointment. He is taking Lexapro 10 mg daily. He reported no side effects of medication. He has lost 10 more pounds since he is more involved and exercise and walking. He is also watching his diet. He is very relief and excited about his weight loss. He feel Lexapro also helping him a lot. He takes Valium only as needed. He denies any agitation anger mood swing or crying spells. He sleeps fine. He is also seeing therapist regularly.  Current psychiatric medication. Lexapro 10 mg daily. Valium 5 mg as needed prescribed by his primary care physician.  Medical history Patient has history of back pain, hypertension, tinnitus, Obstructive sleep apnea, knee surgery and back surgery. Patient also has bladder cancer and carpal tunnel release.  Mental status examination Patient is casually dressed fairly groomed. He is pleasant, calm, and cooperative. He maintained good eye contact. His speech is slow but clear coherent and fluent. His thought process is logical linear and goal-directed. He denies any auditory or visual hallucination. He denies any active or passive suicidal thinking and homicidal thinking. He described his mood is good and his affect is mood congruent. His attention and concentration is good. He's alert and oriented x3. His insight judgment and impulse control is okay.  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 He has enough refill of Lexapro 20 mg, which he is cutting down in half. I reinforced compliance of the medication. I will consider increasing the dose on next appointment. I will see him again in 8 weeks. I recommended  to call us if he has a question or concern about the medication or if he feel worsening of the symptoms.

## 2012-04-04 ENCOUNTER — Ambulatory Visit (INDEPENDENT_AMBULATORY_CARE_PROVIDER_SITE_OTHER): Payer: Medicare Other | Admitting: Psychology

## 2012-04-04 DIAGNOSIS — F411 Generalized anxiety disorder: Secondary | ICD-10-CM

## 2012-04-04 DIAGNOSIS — F331 Major depressive disorder, recurrent, moderate: Secondary | ICD-10-CM | POA: Diagnosis not present

## 2012-05-17 ENCOUNTER — Encounter (HOSPITAL_COMMUNITY): Payer: Self-pay | Admitting: Psychiatry

## 2012-05-17 ENCOUNTER — Ambulatory Visit (INDEPENDENT_AMBULATORY_CARE_PROVIDER_SITE_OTHER): Payer: Medicare Other | Admitting: Psychiatry

## 2012-05-17 DIAGNOSIS — F329 Major depressive disorder, single episode, unspecified: Secondary | ICD-10-CM

## 2012-05-17 NOTE — Progress Notes (Signed)
Chief complaint Medication management and followup.  History presenting illness. Patient is 66 year old Caucasian male who came for his followup appointment. He is taking Lexapro 10 mg daily. He reported no side effects of medication.  His depression and anxiety is well controlled on her Lexapro.  There are some days when he feel down and isolated but otherwise he had a good energy level.  He sleeps fine.  He denies any crying spells or anxiety spells.  He denies any agitation anger or mood swings.  He takes Valium as needed which is prescribed by his primary care physician.  He does not feel he needs to increase his Lexapro.  Current psychiatric medication. Lexapro 10 mg daily. Valium 5 mg as needed prescribed by his primary care physician.  Medical history Patient has history of back pain, hypertension, tinnitus, Obstructive sleep apnea, knee surgery and back surgery. Patient also has bladder cancer and carpal tunnel release.  Mental status examination Patient is casually dressed fairly groomed. He is pleasant, calm, and cooperative. He maintained good eye contact. His speech is slow but clear coherent and fluent. His thought process is logical linear and goal-directed. He denies any auditory or visual hallucination. He denies any active or passive suicidal thinking and homicidal thinking. He described his mood is good and his affect is mood congruent. His attention and concentration is good. He's alert and oriented x3. His insight judgment and impulse control is okay.  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 He has enough refill of Lexapro 20 mg, which he is cutting down in half. I reinforced compliance of the medication. I will see him again in 8 weeks. I recommended to call  us if he has a question or concern about the medication or if he feel worsening of the symptoms.

## 2012-05-26 ENCOUNTER — Encounter (HOSPITAL_COMMUNITY): Payer: Self-pay | Admitting: Psychology
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
## 2012-06-01 ENCOUNTER — Ambulatory Visit (INDEPENDENT_AMBULATORY_CARE_PROVIDER_SITE_OTHER): Payer: Medicare Other | Admitting: Psychology

## 2012-06-01 DIAGNOSIS — F331 Major depressive disorder, recurrent, moderate: Secondary | ICD-10-CM

## 2012-06-01 DIAGNOSIS — F411 Generalized anxiety disorder: Secondary | ICD-10-CM

## 2012-06-02 ENCOUNTER — Encounter (HOSPITAL_COMMUNITY): Payer: Self-pay | Admitting: Psychology

## 2012-06-02 DIAGNOSIS — I10 Essential (primary) hypertension: Secondary | ICD-10-CM | POA: Diagnosis not present

## 2012-06-02 DIAGNOSIS — E785 Hyperlipidemia, unspecified: Secondary | ICD-10-CM | POA: Diagnosis not present

## 2012-06-02 DIAGNOSIS — G4733 Obstructive sleep apnea (adult) (pediatric): Secondary | ICD-10-CM | POA: Diagnosis not present

## 2012-06-02 DIAGNOSIS — R7309 Other abnormal glucose: Secondary | ICD-10-CM | POA: Diagnosis not present
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
## 2012-06-20 DIAGNOSIS — Z125 Encounter for screening for malignant neoplasm of prostate: Secondary | ICD-10-CM | POA: Diagnosis not present

## 2012-06-20 DIAGNOSIS — C679 Malignant neoplasm of bladder, unspecified: Secondary | ICD-10-CM | POA: Diagnosis not present

## 2012-07-11 ENCOUNTER — Encounter (HOSPITAL_COMMUNITY): Payer: Self-pay | Admitting: Psychology

## 2012-07-11 ENCOUNTER — Ambulatory Visit (INDEPENDENT_AMBULATORY_CARE_PROVIDER_SITE_OTHER): Payer: Medicare Other | Admitting: Psychology

## 2012-07-11 DIAGNOSIS — F331 Major depressive disorder, recurrent, moderate: Secondary | ICD-10-CM

## 2012-07-11 DIAGNOSIS — F411 Generalized anxiety disorder: Secondary | ICD-10-CM
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
## 2012-07-14 ENCOUNTER — Ambulatory Visit (HOSPITAL_COMMUNITY): Payer: Self-pay | Admitting: Psychiatry

## 2012-07-26 ENCOUNTER — Ambulatory Visit (HOSPITAL_COMMUNITY): Payer: Self-pay | Admitting: Psychiatry

## 2012-07-29 DIAGNOSIS — M545 Low back pain: Secondary | ICD-10-CM | POA: Diagnosis not present

## 2012-07-29 DIAGNOSIS — M5137 Other intervertebral disc degeneration, lumbosacral region: Secondary | ICD-10-CM | POA: Diagnosis not present

## 2012-07-29 DIAGNOSIS — M999 Biomechanical lesion, unspecified: Secondary | ICD-10-CM | POA: Diagnosis not present

## 2012-08-08 ENCOUNTER — Ambulatory Visit (INDEPENDENT_AMBULATORY_CARE_PROVIDER_SITE_OTHER): Payer: Medicare Other | Admitting: Psychology

## 2012-08-08 DIAGNOSIS — F331 Major depressive disorder, recurrent, moderate: Secondary | ICD-10-CM | POA: Diagnosis not present

## 2012-08-08 DIAGNOSIS — F411 Generalized anxiety disorder: Secondary | ICD-10-CM | POA: Diagnosis not present

## 2012-08-08 DIAGNOSIS — F419 Anxiety disorder, unspecified: Secondary | ICD-10-CM
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
## 2012-08-12 DIAGNOSIS — M5137 Other intervertebral disc degeneration, lumbosacral region: Secondary | ICD-10-CM | POA: Diagnosis not present

## 2012-08-12 DIAGNOSIS — M999 Biomechanical lesion, unspecified: Secondary | ICD-10-CM | POA: Diagnosis not present

## 2012-08-12 DIAGNOSIS — M545 Low back pain: Secondary | ICD-10-CM | POA: Diagnosis not present

## 2012-08-15 ENCOUNTER — Ambulatory Visit (INDEPENDENT_AMBULATORY_CARE_PROVIDER_SITE_OTHER): Payer: Medicare Other

## 2012-08-15 ENCOUNTER — Encounter: Payer: Self-pay | Admitting: Orthopedic Surgery

## 2012-08-15 ENCOUNTER — Ambulatory Visit (INDEPENDENT_AMBULATORY_CARE_PROVIDER_SITE_OTHER): Payer: Medicare Other | Admitting: Orthopedic Surgery

## 2012-08-15 VITALS — BP 124/70 | Ht 72.0 in | Wt 226.0 lb

## 2012-08-15 DIAGNOSIS — M7750 Other enthesopathy of unspecified foot: Secondary | ICD-10-CM

## 2012-08-15 DIAGNOSIS — M25572 Pain in left ankle and joints of left foot: Secondary | ICD-10-CM

## 2012-08-15 DIAGNOSIS — M7670 Peroneal tendinitis, unspecified leg: Secondary | ICD-10-CM

## 2012-08-15 DIAGNOSIS — M25579 Pain in unspecified ankle and joints of unspecified foot: Secondary | ICD-10-CM

## 2012-08-15 MED ORDER — NABUMETONE 500 MG PO TABS: 500.0000 mg | ORAL_TABLET | Freq: Two times a day (BID) | ORAL | Status: DC

## 2012-08-15 NOTE — Patient Instructions (Signed)
Call hospital to arrange PT  

## 2012-08-16 ENCOUNTER — Encounter: Payer: Self-pay | Admitting: Orthopedic Surgery

## 2012-08-16 ENCOUNTER — Ambulatory Visit (HOSPITAL_COMMUNITY): Payer: Self-pay | Admitting: Psychiatry

## 2012-08-16 DIAGNOSIS — M7670 Peroneal tendinitis, unspecified leg: Secondary | ICD-10-CM | POA: Insufficient documentation

## 2012-08-16 NOTE — Progress Notes (Signed)
Patient ID: Zachary [name], male   DOB: [birth year], 66 y.o.   MRN: [id number] Chief Complaint  Patient presents with  . Ankle Pain    left ankle pain, no known injury   [name] is 66 years old he complains of several year history lateral ankle pain which is located over the lateral part of his foot just distal to the lateral malleolus. He complains that the pain has become severe, constant, with increased pain with ambulation. He had some remote trauma nothing recently. Modifying factors include topical medications but they are not relieving his symptoms. He denies giving way or locking.  Review of systems he does have chronic pain so he does have some neurologic symptoms of numbness and tingling unsteady gait. Denies temperature changes in the foot. Denies fever.  History   Social History Narrative  . No narrative on file    Examination BP 124/70  Ht 6' (1.829 m)  Wt 226 lb (102.513 kg)  BMI 30.65 kg/m2  General appearance he has an ectomorphic body habitus with no gross deformity  He is oriented x3  His mood and affect are normal  He ambulates with a heel toe gait favoring the left lower chest remedy  Left foot and ankle exam  Inspection and palpation revealed tenderness over the peroneal tendon and atrophy appears to be noted in the foot with some mild tenderness over the anterior talofibular ligament. He has normal range of motion in the ankle. The ankle is stable with anterior drawer test. He has active eversion and inversion with no notable weakness. Skin is intact. There is no edema. The sensation appeared normal.  Medical decision making a radiograph was ordered. The images interpreted as normal ankle. This and is established problem which is worsened.  Overall risk recommend physical therapy and prescription medication.  He will return in 4 weeks if no improvement an MRI will be ordered to look for a peroneal tendon injury

## 2012-08-17 ENCOUNTER — Ambulatory Visit (HOSPITAL_COMMUNITY)
Admission: RE | Admit: 2012-08-17 | Discharge: 2012-08-17 | Disposition: A | Discharge status: Home / Self Care | Payer: Medicare Other | Source: Ambulatory Visit | Attending: Orthopedic Surgery | Admitting: Orthopedic Surgery

## 2012-08-17 DIAGNOSIS — IMO0001 Reserved for inherently not codable concepts without codable children: Secondary | ICD-10-CM | POA: Diagnosis not present

## 2012-08-17 DIAGNOSIS — M25673 Stiffness of unspecified ankle, not elsewhere classified: Secondary | ICD-10-CM | POA: Diagnosis not present

## 2012-08-17 DIAGNOSIS — R262 Difficulty in walking, not elsewhere classified: Secondary | ICD-10-CM | POA: Diagnosis not present

## 2012-08-17 DIAGNOSIS — M25579 Pain in unspecified ankle and joints of unspecified foot: Secondary | ICD-10-CM | POA: Insufficient documentation

## 2012-08-17 DIAGNOSIS — M25676 Stiffness of unspecified foot, not elsewhere classified: Secondary | ICD-10-CM | POA: Insufficient documentation
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
## 2012-08-19 ENCOUNTER — Ambulatory Visit (HOSPITAL_COMMUNITY)
Admission: RE | Admit: 2012-08-19 | Discharge: 2012-08-19 | Disposition: A | Discharge status: Home / Self Care | Payer: Medicare Other | Source: Ambulatory Visit | Attending: Orthopedic Surgery | Admitting: Orthopedic Surgery

## 2012-08-19 DIAGNOSIS — R262 Difficulty in walking, not elsewhere classified: Secondary | ICD-10-CM | POA: Diagnosis not present

## 2012-08-19 DIAGNOSIS — M25673 Stiffness of unspecified ankle, not elsewhere classified: Secondary | ICD-10-CM

## 2012-08-19 DIAGNOSIS — IMO0001 Reserved for inherently not codable concepts without codable children: Secondary | ICD-10-CM | POA: Diagnosis not present

## 2012-08-19 DIAGNOSIS — M25579 Pain in unspecified ankle and joints of unspecified foot: Secondary | ICD-10-CM | POA: Diagnosis not present

## 2012-08-19 NOTE — Progress Notes (Signed)
Physical Therapy Treatment Patient Details  Name: Zachary [name] MRN: [id number] Date of Birth: 1946-10-31  Today's Date: 08/19/2012 Time: 0803-0842 PT Time Calculation (min): 39 min  Visit#: 2  of 8   Re-eval: 09/16/12    Authorization:    Authorization Time Period:    Authorization Visit#: 2  of 10    Subjective: Symptoms/Limitations Symptoms: Pt states he is very sore; pt was walking up and down the field a lot last night. Pain Assessment Currently in Pain?: Yes Pain Score:   7 Pain Location: Ankle Pain Orientation: Left     Exercise/Treatments    Ankle Stretches Plantar Fascia Stretch: 3 reps;30 seconds Gastroc Stretch: 2 reps;60 seconds Slant Board Stretch: 3 reps;30 seconds Other Stretch: supine piriformis/hamstretch Other Stretch: long ham stretch 'x 2   Ankle Exercises - Standing Rocker Board: 1 minute (A/P )   Ankle Exercises - Supine Other Supine Ankle Exercises: Q-sets x 10 Ankle Exercises - Sidelying   Modalities Modalities: Ultrasound Ultrasound Ultrasound Location: inferior aspect of L malleoli Ultrasound Parameters: 1.3w/cm2 x 8' 3 mgHZ Ultrasound Goals: Pain  Physical Therapy Assessment and Plan PT Assessment and Plan Clinical Impression Statement: Pt completed stretching ex well; noted atrophy of quad and gastroc mm.  Instructed pt to complete 100 q-sets/day. Pt unable to complete longsit piriformis stretch due to back changed to supine. PT Plan: begin SLS next treatment.    Goals    Problem List Patient Active Problem List  Diagnosis  . TENOSYNOVITIS OF FOOT AND ANKLE  . Lateral epicondylitis/tennis elbow  . Trigger point of extremity  . Pain in joint, ankle and foot  . Abnormality of gait  . Depression  . Shortness of breath  . Precordial pain  . Essential hypertension, benign  . Mixed hyperlipidemia  . Peroneal tendinitis  . Difficulty in walking  . Stiffness of joint, not elsewhere classified, ankle and foot    PT - End  of Session Activity Tolerance: Patient tolerated treatment well General Behavior During Session: [facility] for tasks performed Cognition: [facility] for tasks performed  GP    RUSSELL,CINDY 08/19/2012, 8:51 AM

## 2012-08-23 ENCOUNTER — Ambulatory Visit (HOSPITAL_COMMUNITY)
Admission: RE | Admit: 2012-08-23 | Discharge: 2012-08-23 | Disposition: A | Discharge status: Home / Self Care | Payer: Medicare Other | Source: Ambulatory Visit | Attending: Internal Medicine | Admitting: Internal Medicine

## 2012-08-23 DIAGNOSIS — M25676 Stiffness of unspecified foot, not elsewhere classified: Secondary | ICD-10-CM | POA: Diagnosis not present

## 2012-08-23 DIAGNOSIS — IMO0001 Reserved for inherently not codable concepts without codable children: Secondary | ICD-10-CM | POA: Diagnosis not present

## 2012-08-23 DIAGNOSIS — M25579 Pain in unspecified ankle and joints of unspecified foot: Secondary | ICD-10-CM | POA: Diagnosis not present

## 2012-08-23 DIAGNOSIS — R262 Difficulty in walking, not elsewhere classified: Secondary | ICD-10-CM | POA: Diagnosis not present

## 2012-08-23 NOTE — Progress Notes (Signed)
Physical Therapy Treatment Patient Details  Name: Zachary [name] MRN: [id number] Date of Birth: 1946/11/05  Today's Date: 08/23/2012 Time: 4098-1191 PT Time Calculation (min): 45 min  Visit#: 3  of 8   Re-eval: 09/16/12 Charges: Therex x 32' [place] x 8'  Authorization: Medicare  Authorization Time Period:    Authorization Visit#: 3  of 10    Subjective: Symptoms/Limitations Symptoms: Pt states that he over did it last weekend. He was on his feet a lot. Pain Assessment Currently in Pain?: Yes Pain Score:   3 Pain Location: Ankle Pain Orientation: Left   Exercise/Treatments Ankle Stretches Plantar Fascia Stretch: 3 reps;30 seconds Slant Board Stretch: 3 reps;30 seconds Other Stretch: supine piriformis 3x30" Other Stretch: long ham stretch 2x1' Ankle Exercises - Standing SLS: L 3" max of 5 Rocker Board: 2 minutes Heel Raises: 10 reps Toe Raise: 10 reps Ankle Exercises - Supine Other Supine Ankle Exercises: Q-sets x 15  Modalities Modalities: Ultrasound Ultrasound Ultrasound Location: inferior aspect of L lateral malleolus Ultrasound Parameters: 1.3w/cm2 x 8' 3 mgHZ  Ultrasound Goals: Pain  Physical Therapy Assessment and Plan PT Assessment and Plan Clinical Impression Statement: Pt completes therex well. Pt appears to be responding well the stretches. [place] completed again this session secondary to beneficial results after last session. Pt reports pain decrease to 0/10 at end of session. PT Plan: Continue to progress per PT POC.     Problem List Patient Active Problem List  Diagnosis  . TENOSYNOVITIS OF FOOT AND ANKLE  . Lateral epicondylitis/tennis elbow  . Trigger point of extremity  . Pain in joint, ankle and foot  . Abnormality of gait  . Depression  . Shortness of breath  . Precordial pain  . Essential hypertension, benign  . Mixed hyperlipidemia  . Peroneal tendinitis  . Difficulty in walking  . Stiffness of joint, not elsewhere classified, ankle and  foot    PT - End of Session Activity Tolerance: Patient tolerated treatment well General Behavior During Session: [facility] for tasks performed Cognition: [facility] for tasks performed  Seth Bake, PTA 08/23/2012, 10:32 AM

## 2012-08-25 ENCOUNTER — Ambulatory Visit (HOSPITAL_COMMUNITY)
Admission: RE | Admit: 2012-08-25 | Discharge: 2012-08-25 | Disposition: A | Discharge status: Home / Self Care | Payer: Medicare Other | Source: Ambulatory Visit | Attending: Internal Medicine | Admitting: Internal Medicine

## 2012-08-25 DIAGNOSIS — M25673 Stiffness of unspecified ankle, not elsewhere classified: Secondary | ICD-10-CM | POA: Diagnosis not present

## 2012-08-25 DIAGNOSIS — R262 Difficulty in walking, not elsewhere classified: Secondary | ICD-10-CM

## 2012-08-25 DIAGNOSIS — IMO0001 Reserved for inherently not codable concepts without codable children: Secondary | ICD-10-CM | POA: Diagnosis not present

## 2012-08-25 DIAGNOSIS — M25579 Pain in unspecified ankle and joints of unspecified foot: Secondary | ICD-10-CM | POA: Diagnosis not present

## 2012-08-25 NOTE — Progress Notes (Signed)
Physical Therapy Treatment Patient Details  Name: Zachary [name] MRN: [id number] Date of Birth: 05/10/1946  Today's Date: 08/25/2012 Time: 0803-0852 PT Time Calculation (min): 49 min  Visit#: 4  of 8   Re-eval: 09/16/12  Charge: therex 40', [place] 8'  Authorization: Medicare  Authorization Visit#: 4  of 8    Subjective: Symptoms/Limitations Symptoms: Ankle feeling good just feel brusied. Pain Assessment Currently in Pain?: No/denies  Objective:   Exercise/Treatments Ankle Stretches Plantar Fascia Stretch: 3 reps;30 seconds Slant Board Stretch: 3 reps;30 seconds Other Stretch: supine piriformis 3x30" Other Stretch: long ham stretch 2x1' Ankle Exercises - Standing SLS: L 7", R 16" max of 5 Rocker Board: 2 minutes;Limitations Rocker Board Limitations: A/P R/L Heel Raises: 15 reps Toe Raise: 15 reps  Physical Therapy Assessment and Plan PT Assessment and Plan Clinical Impression Statement: Improving ankle strategy following cueing for spatial awareness.  Pt completed all therex activities and stretches well with good form.  [place] completed again at end of session due to beneficial results following previous sessions.  Pt reports pain scale 0/10. PT Plan: Continue to progress per PT POC.  Begin cybex leg press with focus on eccentric control with PF.    Goals    Problem List Patient Active Problem List  Diagnosis  . TENOSYNOVITIS OF FOOT AND ANKLE  . Lateral epicondylitis/tennis elbow  . Trigger point of extremity  . Pain in joint, ankle and foot  . Abnormality of gait  . Depression  . Shortness of breath  . Precordial pain  . Essential hypertension, benign  . Mixed hyperlipidemia  . Peroneal tendinitis  . Difficulty in walking  . Stiffness of joint, not elsewhere classified, ankle and foot    PT - End of Session Activity Tolerance: Patient tolerated treatment well General Behavior During Session: [facility] for tasks performed Cognition: [facility] for tasks performed  GP    [name] 08/25/2012, 9:22 AM

## 2012-08-26 DIAGNOSIS — M5137 Other intervertebral disc degeneration, lumbosacral region: Secondary | ICD-10-CM | POA: Diagnosis not present

## 2012-08-26 DIAGNOSIS — M999 Biomechanical lesion, unspecified: Secondary | ICD-10-CM | POA: Diagnosis not present

## 2012-08-26 DIAGNOSIS — M545 Low back pain: Secondary | ICD-10-CM | POA: Diagnosis not present

## 2012-08-29 ENCOUNTER — Telehealth (HOSPITAL_COMMUNITY): Payer: Self-pay | Admitting: Physical Therapy

## 2012-08-30 ENCOUNTER — Ambulatory Visit (HOSPITAL_COMMUNITY): Payer: Self-pay | Admitting: Psychology

## 2012-08-30 ENCOUNTER — Ambulatory Visit (HOSPITAL_COMMUNITY)
Admission: RE | Admit: 2012-08-30 | Discharge: 2012-08-30 | Disposition: A | Discharge status: Home / Self Care | Payer: Medicare Other | Source: Ambulatory Visit | Attending: Internal Medicine | Admitting: Internal Medicine

## 2012-08-30 DIAGNOSIS — M25676 Stiffness of unspecified foot, not elsewhere classified: Secondary | ICD-10-CM | POA: Insufficient documentation

## 2012-08-30 DIAGNOSIS — R262 Difficulty in walking, not elsewhere classified: Secondary | ICD-10-CM | POA: Diagnosis not present

## 2012-08-30 DIAGNOSIS — M25673 Stiffness of unspecified ankle, not elsewhere classified: Secondary | ICD-10-CM

## 2012-08-30 DIAGNOSIS — M25579 Pain in unspecified ankle and joints of unspecified foot: Secondary | ICD-10-CM | POA: Insufficient documentation

## 2012-08-30 DIAGNOSIS — IMO0001 Reserved for inherently not codable concepts without codable children: Secondary | ICD-10-CM | POA: Insufficient documentation

## 2012-08-30 NOTE — Progress Notes (Signed)
Physical Therapy Treatment Patient Details  Name: Zachary [name] MRN: [id number] Date of Birth: [birth year]  Today's Date: 08/30/2012 Time: 4098-1191 PT Time Calculation (min): 60 min Charges: 30' Manual, 10' TE, 10' gait, 8' [place] Visit#: 5  of 8   Re-eval: 09/16/12    Authorization: Medicare  Authorization Time Period:    Authorization Visit#: 5  of 8    Subjective: Symptoms/Limitations Symptoms: Pt reports it keeps getting better every time.  Pain Assessment Currently in Pain?: Yes Pain Score:   1 Pain Location: Ankle Pain Orientation: Left  Precautions/Restrictions     Exercise/Treatments Stretches Gastroc Stretch: 3 reps;60 seconds;Limitations (Slant board) Gastroc Stretch Limitations: Plantar Fascia Stretch 3x60 sec Standing Heel Raises: 20 reps;Limitations Heel Raises Limitations: Toe Raises 20 Reps Gait Training: W/cueing for proper foot mechanics x 10 minutes w/o shoes  Modalities Modalities: Ultrasound Manual Therapy Manual Therapy: Myofascial release Joint Mobilization: LLE distraction w/ossicilations.  Grade II-III to proximal and distal fibula, Grade II-III to L calcenous Myofascial Release: To L LE gastroc musculature to decrease fascial restricions w/STM after Ultrasound Ultrasound Location: inferior aspect of L lateral malleolus  Ultrasound Parameters: 0.8w/cm2 x 8' 3 mHZ continous Ultrasound Goals: Pain  Physical Therapy Assessment and Plan PT Assessment and Plan Clinical Impression Statement: Has significant decreased knee extension, calcaneal varus and foot supination with decreased great toe push off  during stance causing improper muscle length to LLE.  Pt had improved awareness of proper gait mechanics and decreased pain at the end of treatment today.  PT Plan: Continue to address improper gait mechanics and improve awareness of foot mechanics.     Goals    Problem List Patient Active Problem List  Diagnosis  . TENOSYNOVITIS OF FOOT AND ANKLE   . Lateral epicondylitis/tennis elbow  . Trigger point of extremity  . Pain in joint, ankle and foot  . Abnormality of gait  . Depression  . Shortness of breath  . Precordial pain  . Essential hypertension, benign  . Mixed hyperlipidemia  . Peroneal tendinitis  . Difficulty in walking  . Stiffness of joint, not elsewhere classified, ankle and foot    PT - End of Session Activity Tolerance: Patient tolerated treatment well PT Plan of Care PT Patient Instructions: Educated paitnet on importance of proper gait mechanics to   [employer], PT 08/30/2012, 10:41 AM

## 2012-09-01 ENCOUNTER — Ambulatory Visit (HOSPITAL_COMMUNITY)
Admission: RE | Admit: 2012-09-01 | Discharge: 2012-09-01 | Disposition: A | Discharge status: Home / Self Care | Payer: Medicare Other | Source: Ambulatory Visit | Attending: *Deleted | Admitting: *Deleted

## 2012-09-01 DIAGNOSIS — M25673 Stiffness of unspecified ankle, not elsewhere classified: Secondary | ICD-10-CM | POA: Diagnosis not present

## 2012-09-01 DIAGNOSIS — M25579 Pain in unspecified ankle and joints of unspecified foot: Secondary | ICD-10-CM | POA: Diagnosis not present

## 2012-09-01 DIAGNOSIS — IMO0001 Reserved for inherently not codable concepts without codable children: Secondary | ICD-10-CM | POA: Diagnosis not present

## 2012-09-01 DIAGNOSIS — R262 Difficulty in walking, not elsewhere classified: Secondary | ICD-10-CM | POA: Diagnosis not present

## 2012-09-01 NOTE — Progress Notes (Signed)
Physical Therapy Treatment Patient Details  Name: Zachary [name] MRN: [id number] Date of Birth: 04/11/1946  Today's Date: 09/01/2012 Time: 0803-0858 PT Time Calculation (min): 55 min  Visit#: 6  of 8   Re-eval: 09/16/12 Charges: Therex x 30' Manual x 10' [place] x 8'  Authorization: Medicare  Authorization Visit#: 6  of 8    Subjective: Symptoms/Limitations Symptoms: Pt reports HEP compliance. Pain Assessment Currently in Pain?: No/denies   Exercise/Treatments Ankle Stretches Plantar Fascia Stretch: 3 reps;60 seconds Soleus Stretch: 3 reps;60 seconds Slant Board Stretch: 3 reps;60 seconds Ankle Exercises - Standing SLS: L:12" R:13" max of 5 Rocker Board: 2 minutes;Limitations Rocker Board Limitations: A/P R/L Heel Raises: 20 reps Toe Raise: 20 reps  Modalities Modalities: Ultrasound Manual Therapy Manual Therapy: Myofascial release Myofascial Release: To L LE gastroc musculature to decrease fascial restricions w/STM after ultrasound Ultrasound Ultrasound Location: inferior aspect of L lateral malleolus  Ultrasound Parameters: 0.8w/cm2 x 8' 3 mHZ continous  Ultrasound Goals: Pain  Physical Therapy Assessment and Plan PT Assessment and Plan Clinical Impression Statement: Pt reports decreased pain that he attributes to becoming more aware of gait mechanics. Pt continues to present with significant tightness in L gastroc which decreased with manual techniques. Pt requires vc's to extend knee fully during heel strike. Pt is without complaint throughout session. PT Plan: Continue to address improper gait mechanics and improve awareness of foot mechanics per PT POC.     Problem List Patient Active Problem List  Diagnosis  . TENOSYNOVITIS OF FOOT AND ANKLE  . Lateral epicondylitis/tennis elbow  . Trigger point of extremity  . Pain in joint, ankle and foot  . Abnormality of gait  . Depression  . Shortness of breath  . Precordial pain  . Essential hypertension, benign  .  Mixed hyperlipidemia  . Peroneal tendinitis  . Difficulty in walking  . Stiffness of joint, not elsewhere classified, ankle and foot    PT - End of Session Activity Tolerance: Patient tolerated treatment well General Behavior During Session: [facility] for tasks performed Cognition: [facility] for tasks performed  Seth Bake, PTA 09/01/2012, 9:20 AM

## 2012-09-06 ENCOUNTER — Ambulatory Visit (HOSPITAL_COMMUNITY)
Admission: RE | Admit: 2012-09-06 | Discharge: 2012-09-06 | Disposition: A | Discharge status: Home / Self Care | Payer: Medicare Other | Source: Ambulatory Visit | Attending: Internal Medicine | Admitting: Internal Medicine

## 2012-09-06 DIAGNOSIS — M25579 Pain in unspecified ankle and joints of unspecified foot: Secondary | ICD-10-CM | POA: Diagnosis not present

## 2012-09-06 DIAGNOSIS — R262 Difficulty in walking, not elsewhere classified: Secondary | ICD-10-CM | POA: Diagnosis not present

## 2012-09-06 DIAGNOSIS — M25676 Stiffness of unspecified foot, not elsewhere classified: Secondary | ICD-10-CM | POA: Diagnosis not present

## 2012-09-06 DIAGNOSIS — IMO0001 Reserved for inherently not codable concepts without codable children: Secondary | ICD-10-CM | POA: Diagnosis not present

## 2012-09-06 NOTE — Progress Notes (Signed)
Physical Therapy Treatment Patient Details  Name: Zachary [name] MRN: [id number] Date of Birth: 1946/05/12  Today's Date: 09/06/2012 Time: 4696-2952 PT Time Calculation (min): 45 min  Visit#: 7  of 8   Re-eval: 09/16/12 Charges: Therex x 20' Manual x 10' [place] x 8'  Authorization: Medicare  Authorization Time Period:    Authorization Visit#: 7  of 8    Subjective: Symptoms/Limitations Symptoms: I"m sore today. Pain Assessment Currently in Pain?: Yes Pain Score:   4 Pain Location: Ankle Pain Orientation: Left   Exercise/Treatments Ankle Stretches Plantar Fascia Stretch: 3 reps;60 seconds Soleus Stretch: 3 reps;60 seconds Slant Board Stretch: 3 reps;60 seconds Ankle Exercises - Standing Rocker Board: 2 minutes;Limitations Rocker Board Limitations: A/P R/L  Modalities Modalities: Ultrasound Manual Therapy Manual Therapy: Massage Joint Mobilization: LLE distraction w/ossicilations. Grade II-III to L calcenous Massage: Gentle massage to area inferior to L lateral malleolus to decrease pain and fascial restrictions. Ultrasound Ultrasound Location: inferior aspect of L lateral malleolus  Ultrasound Parameters: 0.8w/cm2 x 8' 3 mHZ continous  Ultrasound Goals: Pain  Physical Therapy Assessment and Plan PT Assessment and Plan Clinical Impression Statement: Held most therex this session secondary to increased soreness after last session and increased pain level at beginning of session. Tx focus on increasing flexibility/ROM and decreasing pain. Pt reports pain decrease to 0/10 at end of session. PT Plan: Reassess next session.     Problem List Patient Active Problem List  Diagnosis  . TENOSYNOVITIS OF FOOT AND ANKLE  . Lateral epicondylitis/tennis elbow  . Trigger point of extremity  . Pain in joint, ankle and foot  . Abnormality of gait  . Depression  . Shortness of breath  . Precordial pain  . Essential hypertension, benign  . Mixed hyperlipidemia  . Peroneal  tendinitis  . Difficulty in walking  . Stiffness of joint, not elsewhere classified, ankle and foot    PT - End of Session Activity Tolerance: Patient tolerated treatment well General Behavior During Session: [facility] for tasks performed Cognition: [facility] for tasks performed  Seth Bake, PTA 09/06/2012, 10:04 AM

## 2012-09-08 ENCOUNTER — Ambulatory Visit (HOSPITAL_COMMUNITY)
Admission: RE | Admit: 2012-09-08 | Discharge: 2012-09-08 | Disposition: A | Discharge status: Home / Self Care | Payer: Medicare Other | Source: Ambulatory Visit | Attending: Internal Medicine | Admitting: Internal Medicine

## 2012-09-08 DIAGNOSIS — M25579 Pain in unspecified ankle and joints of unspecified foot: Secondary | ICD-10-CM | POA: Diagnosis not present

## 2012-09-08 DIAGNOSIS — IMO0001 Reserved for inherently not codable concepts without codable children: Secondary | ICD-10-CM | POA: Diagnosis not present

## 2012-09-08 DIAGNOSIS — M25676 Stiffness of unspecified foot, not elsewhere classified: Secondary | ICD-10-CM | POA: Diagnosis not present

## 2012-09-08 DIAGNOSIS — R262 Difficulty in walking, not elsewhere classified: Secondary | ICD-10-CM | POA: Diagnosis not present
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
## 2012-09-13 ENCOUNTER — Ambulatory Visit (INDEPENDENT_AMBULATORY_CARE_PROVIDER_SITE_OTHER): Payer: Medicare Other | Admitting: Orthopedic Surgery

## 2012-09-13 ENCOUNTER — Encounter: Payer: Self-pay | Admitting: Orthopedic Surgery

## 2012-09-13 VITALS — BP 140/60 | Ht 72.0 in | Wt 226.0 lb

## 2012-09-13 DIAGNOSIS — S8990XA Unspecified injury of unspecified lower leg, initial encounter: Secondary | ICD-10-CM | POA: Diagnosis not present

## 2012-09-13 DIAGNOSIS — S86319A Strain of muscle(s) and tendon(s) of peroneal muscle group at lower leg level, unspecified leg, initial encounter: Secondary | ICD-10-CM

## 2012-09-13 DIAGNOSIS — S99919A Unspecified injury of unspecified ankle, initial encounter: Secondary | ICD-10-CM

## 2012-09-13 NOTE — Patient Instructions (Addendum)
You have been scheduled for an MRI scan.  Your insurance company requires a precertification prior to scheduling the MRI.  If the MRI scan is not approved we will let you know and make further treatment recommendations according to your insurance's guidelines.    We will schedule you for another  appointment to review the results and make further treatment recommendations  (Schedule in the am any day ok )

## 2012-09-13 NOTE — Progress Notes (Signed)
Patient ID: Zachary [name], male   DOB: [birth year], 66 y.o.   MRN: [id number] Chief Complaint  Patient presents with  . Follow-up    4 week recheck on left ankle.    BP 140/60  Ht 6' (1.829 m)  Wt 226 lb (102.513 kg)  BMI 30.65 kg/m2   F/u post PT   No improvement   Tenderness continues along the lateral ankle, including the peroneal tendons. There is weakness and swelling with tenderness over the tarsal metatarsal and lateral tarsal joints  MRI LEFT FOOT   TEAR PERONEAL TENDONS

## 2012-09-14 DIAGNOSIS — H52229 Regular astigmatism, unspecified eye: Secondary | ICD-10-CM | POA: Diagnosis not present

## 2012-09-14 DIAGNOSIS — H353190 Nonexudative age-related macular degeneration, unspecified eye, stage unspecified: Secondary | ICD-10-CM | POA: Diagnosis not present

## 2012-09-14 DIAGNOSIS — H2589 Other age-related cataract: Secondary | ICD-10-CM | POA: Diagnosis not present

## 2012-09-14 DIAGNOSIS — H5210 Myopia, unspecified eye: Secondary | ICD-10-CM | POA: Diagnosis not present

## 2012-09-19 ENCOUNTER — Other Ambulatory Visit (HOSPITAL_COMMUNITY): Payer: Self-pay | Admitting: *Deleted

## 2012-09-19 MED ORDER — ESCITALOPRAM OXALATE 20 MG PO TABS: 10.0000 mg | ORAL_TABLET | Freq: Every day | ORAL | Status: DC

## 2012-09-20 ENCOUNTER — Ambulatory Visit (INDEPENDENT_AMBULATORY_CARE_PROVIDER_SITE_OTHER): Payer: Medicare Other | Admitting: Psychiatry

## 2012-09-20 ENCOUNTER — Encounter (HOSPITAL_COMMUNITY): Payer: Self-pay | Admitting: Psychiatry

## 2012-09-20 VITALS — Wt 227.0 lb

## 2012-09-20 DIAGNOSIS — F329 Major depressive disorder, single episode, unspecified: Secondary | ICD-10-CM

## 2012-09-20 MED ORDER — ESCITALOPRAM OXALATE 10 MG PO TABS: 10.0000 mg | ORAL_TABLET | Freq: Every day | ORAL | Status: DC

## 2012-09-20 NOTE — Progress Notes (Signed)
Chief complaint Medication management and followup.  History presenting illness. Patient is 66 year old Caucasian male who came for his followup appointment. He is taking Lexapro 10 mg daily. He reported no side effects of medication.  He denies any recent agitation anger mood swing.  He is worried about his wife who is been working and recently written up few times.  He is worried if she lost the job then it'll be more financial burden.  Overall he likes his current psychiatric medication.  He denies any recent crying spells or feeling of hopelessness.  He takes Valium as needed which is prescribed by his primary care physician.  He does not drink or use any illegal substance.  Current psychiatric medication. Lexapro 10 mg daily. Valium 5 mg as needed prescribed by his primary care physician.  Medical history Patient has history of back pain, hypertension, tinnitus, Obstructive sleep apnea, knee surgery and back surgery. Patient also has bladder cancer and carpal tunnel release.  His primary care physician is Dr. Margo [name] and he see Dr. Romeo [name] for his pain management.  Mental status examination Patient is casually dressed fairly groomed. He is  anxious but cooperative.  He maintained fair eye contact. His speech is slow but clear coherent and fluent. His thought process is logical linear and goal-directed. He denies any auditory or visual hallucination. He denies any active or passive suicidal thinking and homicidal thinking. He described his mood is good and his affect is mood congruent. His attention and concentration is good. He's alert and oriented x3. His insight judgment and impulse control is okay.  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  he recently refill his Lexapro  but require future refills.  At this time patient is fairly stable on his psychiatric medication.  He seeing therapist for coping and social skills.  I explained risks and benefits of medication and recommend to call us if his any question or concern or if he feel worsening of the symptoms.  I will see him again in 2 months.

## 2012-09-22 ENCOUNTER — Ambulatory Visit (INDEPENDENT_AMBULATORY_CARE_PROVIDER_SITE_OTHER): Payer: Medicare Other | Admitting: Otolaryngology

## 2012-09-22 DIAGNOSIS — H9319 Tinnitus, unspecified ear: Secondary | ICD-10-CM | POA: Diagnosis not present

## 2012-09-22 DIAGNOSIS — H903 Sensorineural hearing loss, bilateral: Secondary | ICD-10-CM | POA: Diagnosis not present

## 2012-09-22 DIAGNOSIS — J310 Chronic rhinitis: Secondary | ICD-10-CM

## 2012-09-22 DIAGNOSIS — Z23 Encounter for immunization: Secondary | ICD-10-CM | POA: Diagnosis not present

## 2012-09-23 DIAGNOSIS — M5137 Other intervertebral disc degeneration, lumbosacral region: Secondary | ICD-10-CM | POA: Diagnosis not present

## 2012-09-23 DIAGNOSIS — M999 Biomechanical lesion, unspecified: Secondary | ICD-10-CM | POA: Diagnosis not present

## 2012-09-23 DIAGNOSIS — M545 Low back pain: Secondary | ICD-10-CM | POA: Diagnosis not present

## 2012-09-30 ENCOUNTER — Telehealth: Payer: Self-pay | Admitting: Radiology

## 2012-09-30 NOTE — Telephone Encounter (Signed)
Patient was informed of his MRI appointment at [facility] on 10-05-12 at 9:45. Patient has Medicare, no authorization is needed. Patient will follow up back in the office for his results.

## 2012-10-03 DIAGNOSIS — I10 Essential (primary) hypertension: Secondary | ICD-10-CM | POA: Diagnosis not present

## 2012-10-03 DIAGNOSIS — E785 Hyperlipidemia, unspecified: Secondary | ICD-10-CM | POA: Diagnosis not present

## 2012-10-05 ENCOUNTER — Ambulatory Visit (HOSPITAL_COMMUNITY)
Admission: RE | Admit: 2012-10-05 | Discharge: 2012-10-05 | Disposition: A | Discharge status: Home / Self Care | Payer: Medicare Other | Source: Ambulatory Visit | Attending: Orthopedic Surgery | Admitting: Orthopedic Surgery

## 2012-10-05 DIAGNOSIS — M659 Synovitis and tenosynovitis, unspecified: Secondary | ICD-10-CM | POA: Diagnosis not present

## 2012-10-05 DIAGNOSIS — M949 Disorder of cartilage, unspecified: Secondary | ICD-10-CM | POA: Diagnosis not present

## 2012-10-05 DIAGNOSIS — M76829 Posterior tibial tendinitis, unspecified leg: Secondary | ICD-10-CM | POA: Insufficient documentation

## 2012-10-05 DIAGNOSIS — M6580 Other synovitis and tenosynovitis, unspecified site: Secondary | ICD-10-CM | POA: Diagnosis not present

## 2012-10-05 DIAGNOSIS — M25579 Pain in unspecified ankle and joints of unspecified foot: Secondary | ICD-10-CM | POA: Diagnosis not present

## 2012-10-05 DIAGNOSIS — M65979 Unspecified synovitis and tenosynovitis, unspecified ankle and foot: Secondary | ICD-10-CM | POA: Insufficient documentation

## 2012-10-05 DIAGNOSIS — S86319A Strain of muscle(s) and tendon(s) of peroneal muscle group at lower leg level, unspecified leg, initial encounter: Secondary | ICD-10-CM

## 2012-10-05 DIAGNOSIS — R937 Abnormal findings on diagnostic imaging of other parts of musculoskeletal system: Secondary | ICD-10-CM | POA: Diagnosis not present

## 2012-10-05 DIAGNOSIS — M899 Disorder of bone, unspecified: Secondary | ICD-10-CM | POA: Diagnosis not present

## 2012-10-06 ENCOUNTER — Ambulatory Visit (HOSPITAL_COMMUNITY)
Admission: RE | Admit: 2012-10-06 | Discharge: 2012-10-06 | Disposition: A | Discharge status: Home / Self Care | Payer: Medicare Other | Source: Ambulatory Visit | Attending: *Deleted | Admitting: *Deleted

## 2012-10-06 ENCOUNTER — Other Ambulatory Visit (HOSPITAL_COMMUNITY): Payer: Self-pay | Admitting: *Deleted

## 2012-10-06 DIAGNOSIS — R51 Headache: Secondary | ICD-10-CM | POA: Insufficient documentation

## 2012-10-06 DIAGNOSIS — H532 Diplopia: Secondary | ICD-10-CM | POA: Diagnosis not present

## 2012-10-06 DIAGNOSIS — H538 Other visual disturbances: Secondary | ICD-10-CM

## 2012-10-11 ENCOUNTER — Ambulatory Visit (INDEPENDENT_AMBULATORY_CARE_PROVIDER_SITE_OTHER): Payer: Medicare Other | Admitting: Orthopedic Surgery

## 2012-10-11 ENCOUNTER — Encounter: Payer: Self-pay | Admitting: Orthopedic Surgery

## 2012-10-11 VITALS — BP 110/64 | Ht 72.0 in | Wt 227.0 lb

## 2012-10-11 DIAGNOSIS — M659 Synovitis and tenosynovitis, unspecified: Secondary | ICD-10-CM

## 2012-10-11 DIAGNOSIS — M19079 Primary osteoarthritis, unspecified ankle and foot: Secondary | ICD-10-CM | POA: Insufficient documentation

## 2012-10-11 NOTE — Patient Instructions (Signed)
Wear the brace, brace full-time for 6 weeks

## 2012-10-11 NOTE — Addendum Note (Signed)
Addended by: Vickki Hearing on: 10/11/2012 09:14 AM   Modules accepted: Level of Service

## 2012-10-11 NOTE — Progress Notes (Signed)
Patient ID: CHAYAN DUSKEY, male   DOB: 03/03/1946, 66 y.o.   MRN: [id number]  Chief Complaint  Patient presents with  . Results    MRI results left foot    MRI was reviewed with the following results. These results have been explained to the patient and he has opted for Cam Walker for 6 weeks. His are to try physical therapy and anti-inflammatory medications as well as topical medications. Activity modification is also been attempted.   IMPRESSION:  1. Tendinopathy and minimal tenosynovitis of the peroneus longus  tendon just distal to a slightly prominent peroneal tubercle.  2. Distal tibialis posterior tendinopathy, correlate clinically in  assessing for tibialis posterior dysfunction.  3. Low-level edema along the deep tibiotalar component of the  deltoid ligament, possibly from prior sprain.  4. Slightly thickened superomedial component of the spring  ligament and attenuated but on torn appearance of the inferior  components of the spring ligament. 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  5. Attenuated dorsal talonavicular ligament, query prior tear.  6. Small subcortical osteochondral lesions in the medial talar  dome adjacent medial plafond.  7. Small ossific structures just below the medial and lateral  malleoli, query prior remote small avulsions or unfused secondary  ossification centers.  8. Degenerative subcortical cystic lesion in the dorsal portion  the cuboid.  Followup in 6 weeks

## 2012-10-12 ENCOUNTER — Ambulatory Visit: Payer: Self-pay | Admitting: Orthopedic Surgery

## 2012-10-17 ENCOUNTER — Telehealth: Payer: Self-pay | Admitting: *Deleted

## 2012-10-17 NOTE — Telephone Encounter (Signed)
Called patient, left message.

## 2012-10-17 NOTE — Telephone Encounter (Signed)
He can bring the Cam Walker back and he should be refund any money spent  If he feels better without it and don't wear

## 2012-10-18 ENCOUNTER — Telehealth: Payer: Self-pay | Admitting: Orthopedic Surgery

## 2012-10-18 NOTE — Telephone Encounter (Signed)
Patient called back, said received message from nurse.  States cannot wear boot as noted.  I relayed message, per Dr. Mort Sawyers response that patient may return boot if unable to wear. Patient has returned it, and advised to keep follow up appointment.  We will follow up with brace supplier, [employer], representative, [employer].

## 2012-10-28 DIAGNOSIS — M79609 Pain in unspecified limb: Secondary | ICD-10-CM | POA: Diagnosis not present

## 2012-10-31 DIAGNOSIS — H547 Unspecified visual loss: Secondary | ICD-10-CM | POA: Diagnosis not present

## 2012-10-31 DIAGNOSIS — H2510 Age-related nuclear cataract, unspecified eye: Secondary | ICD-10-CM | POA: Diagnosis not present

## 2012-10-31 DIAGNOSIS — H40029 Open angle with borderline findings, high risk, unspecified eye: Secondary | ICD-10-CM | POA: Diagnosis not present

## 2012-11-01 ENCOUNTER — Other Ambulatory Visit (HOSPITAL_COMMUNITY): Payer: Self-pay | Admitting: *Deleted

## 2012-11-01 ENCOUNTER — Other Ambulatory Visit (HOSPITAL_COMMUNITY): Payer: Self-pay | Admitting: Psychiatry

## 2012-11-01 DIAGNOSIS — F329 Major depressive disorder, single episode, unspecified: Secondary | ICD-10-CM

## 2012-11-15 ENCOUNTER — Encounter: Payer: Self-pay | Admitting: Orthopedic Surgery

## 2012-11-17 ENCOUNTER — Encounter (HOSPITAL_COMMUNITY): Payer: Self-pay | Admitting: Psychiatry

## 2012-11-17 ENCOUNTER — Ambulatory Visit (INDEPENDENT_AMBULATORY_CARE_PROVIDER_SITE_OTHER): Payer: Medicare Other | Admitting: Psychiatry

## 2012-11-17 VITALS — BP 150/84 | HR 88 | Ht 71.75 in | Wt 231.6 lb

## 2012-11-17 DIAGNOSIS — F329 Major depressive disorder, single episode, unspecified: Secondary | ICD-10-CM
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
## 2012-11-17 NOTE — Patient Instructions (Signed)
Keep the meditation and prayer going.  "[employer] for [employer] by Camelia Phenes could be very helpful.   It is available from multiple sources, but someone I know got it from [email or web address] for a penny.

## 2012-11-21 DIAGNOSIS — H1030 Unspecified acute conjunctivitis, unspecified eye: Secondary | ICD-10-CM | POA: Diagnosis not present

## 2012-11-21 DIAGNOSIS — J019 Acute sinusitis, unspecified: Secondary | ICD-10-CM | POA: Diagnosis not present

## 2012-11-22 ENCOUNTER — Encounter (HOSPITAL_COMMUNITY): Payer: Self-pay | Admitting: Pharmacy Technician

## 2012-11-22 ENCOUNTER — Ambulatory Visit (INDEPENDENT_AMBULATORY_CARE_PROVIDER_SITE_OTHER): Payer: Medicare Other | Admitting: Orthopedic Surgery

## 2012-11-22 ENCOUNTER — Encounter: Payer: Self-pay | Admitting: Orthopedic Surgery

## 2012-11-22 DIAGNOSIS — M629 Disorder of muscle, unspecified: Secondary | ICD-10-CM | POA: Diagnosis not present

## 2012-11-22 DIAGNOSIS — M659 Synovitis and tenosynovitis, unspecified: Secondary | ICD-10-CM | POA: Diagnosis not present

## 2012-11-22 DIAGNOSIS — M7632 Iliotibial band syndrome, left leg: Secondary | ICD-10-CM | POA: Insufficient documentation

## 2012-11-22 DIAGNOSIS — M7750 Other enthesopathy of unspecified foot: Secondary | ICD-10-CM

## 2012-11-22 DIAGNOSIS — M19079 Primary osteoarthritis, unspecified ankle and foot: Secondary | ICD-10-CM

## 2012-11-22 DIAGNOSIS — M7670 Peroneal tendinitis, unspecified leg: Secondary | ICD-10-CM

## 2012-11-22 NOTE — Progress Notes (Signed)
Patient ID: Zachary [name], male   DOB: October 26, 1946, 66 y.o.   MRN: [id number] Chief Complaint  Patient presents with  . Ankle Problem    Recheck LEFT ankle chronic Inflammatory changes and tendinopathy   . Knee Pain    Lateral knee pain, status post lateral meniscectomy by Dr. Thurston Hole. In 2012    1. Localized, primary osteoarthritis of the ankle and foot   2. Peroneal tendinitis   3. Tenosynovitis of foot and ankle   4. Iliotibial band syndrome of left side     The patient has been treated for lateral ankle pain with a Cam Walker and physical therapy. He did not tolerate the CAM walker. His MRI showed chronic inflammation and multiple partial tearing around the peroneal tendons. He has fairly high arch.  He also comes in complaining of lateral knee pain with burning along the joint line. He had a knee arthroscopy and partial lateral meniscectomy back in 2012 in [facility] and is concerned that he may have residual symptoms. At this time.  He denies catching, locking, or giving way in the LEFT knee.  His LEFT foot and ankle has improved with mild residual discomfort.  Physical Exam(12)  Vital signs:   GENERAL: normal development   CDV: pulses are normal   Skin: normal  Lymph: nodes were not palpable/normal  Psychiatric: awake, alert and oriented  Neuro: normal sensation  MSK  Gait: He ambulates with a slightly altered gait 1 Inspection Of the LEFT knee reveals no joint effusion. Lateral joint line tenderness, iliotibial band, tenderness, and painful clicking and painful. Range of motion with flexion of the knee along the iliotibial band 2 Range of Motion Knee flexion 5-115 3 Motor Normal muscle tone 4 Stability His normal  LEFT ankle and foot 5 Decreased swelling decreased tenderness. Tightness in the heel cord. Fairly prominent arch. 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 Time out was completed.The area of maximal tenderness was prepped with alcohol, followed by  Ethyl chloride spray and A 20 gauge needle was inserted Along the lateral aspect of the knee; 4ml 1% lidocaine and 1 ml of depomedrol  was then injected into the joint . The needle was removed and the area cleansed and dressed.  Complications:  None; patient tolerated the procedure well.

## 2012-11-22 NOTE — Patient Instructions (Addendum)
You have received a steroid shot. 15% of patients experience increased pain at the injection site with in the next 24 hours. This is best treated with ice and tylenol extra strength 2 tabs every 8 hours. If you are still having pain please call the office.   Sign release for records for [facility] and [facility]

## 2012-12-12 ENCOUNTER — Encounter (HOSPITAL_COMMUNITY): Payer: Self-pay

## 2012-12-12 ENCOUNTER — Encounter (HOSPITAL_COMMUNITY)
Admission: RE | Admit: 2012-12-12 | Discharge: 2012-12-12 | Disposition: A | Discharge status: Home / Self Care | Payer: Medicare Other | Source: Ambulatory Visit | Attending: Ophthalmology | Admitting: Ophthalmology

## 2012-12-12 DIAGNOSIS — I10 Essential (primary) hypertension: Secondary | ICD-10-CM | POA: Diagnosis not present

## 2012-12-12 DIAGNOSIS — H2510 Age-related nuclear cataract, unspecified eye: Secondary | ICD-10-CM | POA: Diagnosis not present

## 2012-12-12 DIAGNOSIS — G4730 Sleep apnea, unspecified: Secondary | ICD-10-CM | POA: Diagnosis not present

## 2012-12-12 DIAGNOSIS — Z01812 Encounter for preprocedural laboratory examination: Secondary | ICD-10-CM | POA: Diagnosis not present

## 2012-12-12 DIAGNOSIS — Z01810 Encounter for preprocedural cardiovascular examination: Secondary | ICD-10-CM | POA: Diagnosis not present

## 2012-12-12 LAB — BASIC METABOLIC PANEL
BUN: 14 mg/dL (ref 6–23)
CO2: 27 mEq/L (ref 19–32)
GFR calc non Af Amer: 84 mL/min — ABNORMAL LOW (ref >90)
Glucose, Bld: 99 mg/dL (ref 70–99)
Potassium: 4.2 mEq/L (ref 3.5–5.1)

## 2012-12-12 NOTE — Patient Instructions (Signed)
20 Zachary [name]  12/12/2012   Your procedure is scheduled on:  12/19/12  [facility] to [facility] [place] [facility].  Call this number if you have problems the morning of surgery: [phone]   Remember:   Do not eat food:After Midnight.  May have clear liquids:until Midnight .  Clear liquids include soda, tea, black coffee, apple or grape juice, broth.  Take these medicines the morning of surgery with A SIP OF WATER: diovan   Do not wear jewelry, make-up or nail polish.  Do not wear lotions, powders, or perfumes. You may wear deodorant.  Do not shave 48 hours prior to surgery. Men may shave face and neck.  Do not bring valuables to the hospital.  Contacts, dentures or bridgework may not be worn into surgery.  Leave suitcase in the car. After surgery it may be brought to your room.  For patients admitted to the hospital, checkout time is 11:00 AM the day of discharge.   Patients discharged the day of surgery will not be allowed to drive home.  Name and phone number of your driver: family  Special Instructions: N/A   Please read over the following fact sheets that you were given: Pain Booklet, Anesthesia Post-op Instructions and Care and Recovery After Surgery   PATIENT INSTRUCTIONS POST-ANESTHESIA  IMMEDIATELY FOLLOWING SURGERY:  Do not drive or operate machinery for the first twenty four hours after surgery.  Do not make any important decisions for twenty four hours after surgery or while taking narcotic pain medications or sedatives.  If you develop intractable nausea and vomiting or a severe headache please notify your doctor immediately.  FOLLOW-UP:  Please make an appointment with your surgeon as instructed. You do not need to follow up with anesthesia unless specifically instructed to do so.  WOUND CARE INSTRUCTIONS (if applicable):  Keep a dry clean dressing on the anesthesia/puncture wound site if there is drainage.  Once the wound has quit draining you may leave it open to air.   Generally you should leave the bandage intact for twenty four hours unless there is drainage.  If the epidural site drains for more than 36-48 hours please call the anesthesia department.  QUESTIONS?:  Please feel free to call your physician or the hospital operator if you have any questions, and they will be happy to assist you.      Cataract Surgery  A cataract is a clouding of the lens of the eye. When a lens becomes cloudy, vision is reduced based on the degree and nature of the clouding. Surgery may be needed to improve vision. Surgery removes the cloudy lens and usually replaces it with a substitute lens (intraocular lens, IOL). LET YOUR EYE DOCTOR KNOW ABOUT:  Allergies to food or medicine.  Medicines taken including herbs, eyedrops, over-the-counter medicines, and creams.  Use of steroids (by mouth or creams).  Previous problems with anesthetics or numbing medicine.  History of bleeding problems or blood clots.  Previous surgery.  Other health problems, including diabetes and kidney problems.  Possibility of pregnancy, if this applies. RISKS AND COMPLICATIONS  Infection.  Inflammation of the eyeball (endophthalmitis) that can spread to both eyes (sympathetic ophthalmia).  Poor wound healing.  If an IOL is inserted, it can later fall out of proper position. This is very uncommon.  Clouding of the part of your eye that holds an IOL in place. This is called an "after-cataract." These are uncommon, but easily treated. BEFORE THE PROCEDURE  Do not eat  or drink anything except small amounts of water for 8 to 12 before your surgery, or as directed by your caregiver.  Unless you are told otherwise, continue any eyedrops you have been prescribed.  Talk to your primary caregiver about all other medicines that you take (both prescription and non-prescription). In some cases, you may need to stop or change medicines near the time of your surgery. This is most important if you  are taking blood-thinning medicine.Do not stop medicines unless you are told to do so.  Arrange for someone to drive you to and from the procedure.  Do not put contact lenses in either eye on the day of your surgery. PROCEDURE There is more than one method for safely removing a cataract. Your doctor can explain the differences and help determine which is best for you. Phacoemulsification surgery is the most common form of cataract surgery.  An injection is given behind the eye or eyedrops are given to make this a painless procedure.  A small cut (incision) is made on the edge of the clear, dome-shaped surface that covers the front of the eye (cornea).  A tiny probe is painlessly inserted into the eye. This device gives off ultrasound waves that soften and break up the cloudy center of the lens. This makes it easier for the cloudy lens to be removed by suction.  An IOL may be implanted.  The normal lens of the eye is covered by a clear capsule. Part of that capsule is intentionally left in the eye to support the IOL.  Your surgeon may or may not use stitches to close the incision. There are other forms of cataract surgery that require a larger incision and stiches to close the eye. This approach is taken in cases where the doctor feels that the cataract cannot be easily removed using phacoemulsification. AFTER THE PROCEDURE  When an IOL is implanted, it does not need care. It becomes a permanent part of your eye and cannot be seen or felt.  Your doctor will schedule follow-up exams to check on your progress.  Review your other medicines with your doctor to see which can be resumed after surgery.  Use eyedrops or take medicine as prescribed by your doctor. Document Released: 12/03/2011 Document Revised: 03/07/2012 Document Reviewed: 12/03/2011 [facility] Patient Information 2013 [place], [place].

## 2012-12-16 MED ORDER — TETRACAINE HCL 0.5 % OP SOLN
OPHTHALMIC | Status: AC
  Filled 2012-12-16: qty 2

## 2012-12-16 MED ORDER — CYCLOPENTOLATE-PHENYLEPHRINE 0.2-1 % OP SOLN
OPHTHALMIC | Status: AC
  Filled 2012-12-16: qty 2

## 2012-12-16 MED ORDER — PHENYLEPHRINE HCL 2.5 % OP SOLN
OPHTHALMIC | Status: AC
  Filled 2012-12-16: qty 2

## 2012-12-16 MED ORDER — NEOMYCIN-POLYMYXIN-DEXAMETH 3.5-10000-0.1 OP OINT
TOPICAL_OINTMENT | OPHTHALMIC | Status: AC
  Filled 2012-12-16: qty 3.5

## 2012-12-16 MED ORDER — LIDOCAINE HCL 3.5 % OP GEL
OPHTHALMIC | Status: AC
  Filled 2012-12-16: qty 5

## 2012-12-16 MED ORDER — LIDOCAINE HCL (PF) 1 % IJ SOLN
INTRAMUSCULAR | Status: AC
  Filled 2012-12-16: qty 2

## 2012-12-19 ENCOUNTER — Encounter (HOSPITAL_COMMUNITY): Payer: Self-pay | Admitting: Anesthesiology

## 2012-12-19 ENCOUNTER — Ambulatory Visit (HOSPITAL_COMMUNITY)
Admission: RE | Admit: 2012-12-19 | Discharge: 2012-12-19 | Disposition: A | Discharge status: Home / Self Care | Payer: Medicare Other | Source: Ambulatory Visit | Attending: Ophthalmology | Admitting: Ophthalmology

## 2012-12-19 ENCOUNTER — Encounter (HOSPITAL_COMMUNITY): Payer: Self-pay | Admitting: *Deleted

## 2012-12-19 ENCOUNTER — Encounter (HOSPITAL_COMMUNITY)
Admission: RE | Disposition: A | Discharge status: Home / Self Care | Payer: Self-pay | Source: Ambulatory Visit | Attending: Ophthalmology

## 2012-12-19 ENCOUNTER — Ambulatory Visit (HOSPITAL_COMMUNITY): Payer: Medicare Other | Admitting: Anesthesiology

## 2012-12-19 DIAGNOSIS — G4730 Sleep apnea, unspecified: Secondary | ICD-10-CM | POA: Insufficient documentation

## 2012-12-19 DIAGNOSIS — Z01810 Encounter for preprocedural cardiovascular examination: Secondary | ICD-10-CM | POA: Diagnosis not present

## 2012-12-19 DIAGNOSIS — Z961 Presence of intraocular lens: Secondary | ICD-10-CM | POA: Diagnosis not present

## 2012-12-19 DIAGNOSIS — Z01812 Encounter for preprocedural laboratory examination: Secondary | ICD-10-CM | POA: Diagnosis not present

## 2012-12-19 DIAGNOSIS — H2510 Age-related nuclear cataract, unspecified eye: Secondary | ICD-10-CM | POA: Diagnosis not present

## 2012-12-19 DIAGNOSIS — H269 Unspecified cataract: Secondary | ICD-10-CM | POA: Diagnosis not present

## 2012-12-19 DIAGNOSIS — I10 Essential (primary) hypertension: Secondary | ICD-10-CM | POA: Diagnosis not present

## 2012-12-19 HISTORY — PX: CATARACT EXTRACTION W/PHACO: SHX586

## 2012-12-19 SURGERY — PHACOEMULSIFICATION, CATARACT, WITH IOL INSERTION
Anesthesia: Monitor Anesthesia Care | Site: Eye | Laterality: Left | Wound class: Clean

## 2012-12-19 MED ORDER — NEOMYCIN-POLYMYXIN-DEXAMETH 0.1 % OP OINT
TOPICAL_OINTMENT | OPHTHALMIC | Status: DC | PRN
  Administered 2012-12-19: 1 via OPHTHALMIC

## 2012-12-19 MED ORDER — POVIDONE-IODINE 5 % OP SOLN
OPHTHALMIC | Status: DC | PRN
  Administered 2012-12-19: 1 via OPHTHALMIC

## 2012-12-19 MED ORDER — TETRACAINE HCL 0.5 % OP SOLN
1.0000 [drp] | OPHTHALMIC | Status: AC
  Administered 2012-12-19 (×3): 1 [drp] via OPHTHALMIC

## 2012-12-19 MED ORDER — PHENYLEPHRINE HCL 2.5 % OP SOLN
1.0000 [drp] | OPHTHALMIC | Status: AC
  Administered 2012-12-19 (×3): 1 [drp] via OPHTHALMIC

## 2012-12-19 MED ORDER — LIDOCAINE HCL (PF) 1 % IJ SOLN
INTRAMUSCULAR | Status: DC | PRN
  Administered 2012-12-19: .4 mL

## 2012-12-19 MED ORDER — MIDAZOLAM HCL 2 MG/2ML IJ SOLN
INTRAMUSCULAR | Status: AC
  Filled 2012-12-19: qty 2

## 2012-12-19 MED ORDER — EPINEPHRINE HCL 1 MG/ML IJ SOLN
INTRAMUSCULAR | Status: AC
  Filled 2012-12-19: qty 1

## 2012-12-19 MED ORDER — LACTATED RINGERS IV SOLN
INTRAVENOUS | Status: DC
  Administered 2012-12-19: 07:00:00 via INTRAVENOUS

## 2012-12-19 MED ORDER — MIDAZOLAM HCL 5 MG/5ML IJ SOLN
INTRAMUSCULAR | Status: DC | PRN
  Administered 2012-12-19 (×2): 1 mg via INTRAVENOUS

## 2012-12-19 MED ORDER — PROVISC 10 MG/ML IO SOLN
INTRAOCULAR | Status: DC | PRN
  Administered 2012-12-19: 8.5 mg via INTRAOCULAR

## 2012-12-19 MED ORDER — BSS IO SOLN
INTRAOCULAR | Status: DC | PRN
  Administered 2012-12-19: 15 mL via INTRAOCULAR

## 2012-12-19 MED ORDER — LIDOCAINE HCL 3.5 % OP GEL: 1.0000 "application " | Freq: Once | OPHTHALMIC | Status: DC

## 2012-12-19 MED ORDER — CYCLOPENTOLATE-PHENYLEPHRINE 0.2-1 % OP SOLN
1.0000 [drp] | OPHTHALMIC | Status: AC
  Administered 2012-12-19 (×3): 1 [drp] via OPHTHALMIC

## 2012-12-19 MED ORDER — EPINEPHRINE HCL 1 MG/ML IJ SOLN
INTRAOCULAR | Status: DC | PRN
  Administered 2012-12-19: 07:00:00

## 2012-12-19 MED ORDER — MIDAZOLAM HCL 2 MG/2ML IJ SOLN: 1.0000 mg | INTRAMUSCULAR | Status: DC | PRN

## 2012-12-19 SURGICAL SUPPLY — 32 items

## 2012-12-19 NOTE — Transfer of Care (Signed)
Immediate Anesthesia Transfer of Care Note  Patient: Zachary [name]  Procedure(s) Performed: Procedure(s) (LRB): CATARACT EXTRACTION PHACO AND INTRAOCULAR LENS PLACEMENT (IOC) (Left)  Patient Location: Shortstay  Anesthesia Type: MAC  Level of Consciousness: awake  Airway & Oxygen Therapy: Patient Spontanous Breathing   Post-op Assessment: Report given to PACU RN, Post -op Vital signs reviewed and stable and Patient moving all extremities  Post vital signs: Reviewed and stable  Complications: No apparent anesthesia complications

## 2012-12-19 NOTE — H&P (Signed)
I have reviewed the H&P, the patient was re-examined, and I have identified no interval changes in medical condition and plan of care since the history and physical of record  

## 2012-12-19 NOTE — Brief Op Note (Signed)
Pre-Op Dx: Cataract OS Post-Op Dx: Cataract OS Surgeon: Sukhmani Fetherolf Anesthesia: Topical with MAC Surgery: Cataract Extraction with Intraocular lens Implant OS Implant: B&L enVista Specimen: None Complications: None 

## 2012-12-19 NOTE — Op Note (Signed)
NAME:  Zachary [name], SHELLER NO.:  [id number]  MEDICAL RECORD NO.:  [id number]  LOCATION:  [facility]                          FACILITY:  [facility]  PHYSICIAN:  Susanne Greenhouse, MD       DATE OF BIRTH:  07-Nov-1946  DATE OF PROCEDURE: DATE OF DISCHARGE:  12/19/2012                              OPERATIVE REPORT   PREOPERATIVE DIAGNOSIS:  Nuclear cataract, left eye.  POSTOPERATIVE DIAGNOSIS:  Nuclear cataract, left eye.  OPERATION PERFORMED:  Phacoemulsification with posterior chamber intraocular lens implantation, left eye.  SURGEON:  Bonne Dolores. Yitzchak Kothari, MD  ANESTHESIA:  Topical with monitored anesthesia care and IV sedation.  OPERATIVE SUMMARY:  In the preoperative area, dilating drops were placed into the left eye.  The patient was then brought into the operating room where he was placed under topical anesthesia and IV sedation.  The eye was then prepped and draped.  Beginning with a 75 blade, a paracentesis port was made at the surgeon's 2 o'clock position.  The anterior chamber was then filled with a 1% nonpreserved lidocaine solution with epinephrine.  This was followed by Viscoat to deepen the chamber.  A small fornix-based peritomy was performed superiorly.  Next, a single iris hook was placed through the limbus superiorly.  A 2.4-mm keratome blade was then used to make a clear corneal incision over the iris hook. A bent cystotome needle and Utrata forceps were used to create a continuous tear capsulotomy.  Hydrodissection was performed using balanced salt solution on a fine cannula.  The lens nucleus was then removed using phacoemulsification in a quadrant cracking technique.  The cortical material was then removed with irrigation and aspiration.  The capsular bag and anterior chamber were refilled with Provisc.  The wound was widened to approximately 3 mm and a posterior chamber intraocular lens was placed into the capsular bag without difficulty using an [employer] lens  injecting system.  A single 10-0 nylon suture was then used to close the incision as well as stromal hydration.  The Provisc was removed from the anterior chamber and capsular bag with irrigation and aspiration.  At this point, the wounds were tested for leak, which were negative.  The anterior chamber remained deep and stable.  The patient tolerated the procedure well.  There were no operative complications, and he awoke from topical anesthesia and IV sedation without problem.  No surgical specimens.  Prosthetic device used is a Theme park manager, model EnVista, model number MX60 power of 17.0.          ______________________________ Susanne Greenhouse, MD     KEH/MEDQ  D:  12/19/2012  T:  12/19/2012  Job:  [id number]

## 2012-12-19 NOTE — Anesthesia Preprocedure Evaluation (Signed)
Anesthesia Evaluation  Patient identified by MRN, date of birth, ID band Patient awake    Reviewed: Allergy & Precautions, H&P , NPO status , Patient's Chart, lab work & pertinent test results  History of Anesthesia Complications Negative for: history of anesthetic complications  Airway Mallampati: II TM Distance: >3 FB     Dental  (+) Teeth Intact and Implants,    Pulmonary shortness of breath, sleep apnea ,          Cardiovascular hypertension,     Neuro/Psych  Headaches, PSYCHIATRIC DISORDERS Anxiety Depression    GI/Hepatic   Endo/Other    Renal/GU      Musculoskeletal   Abdominal   Peds  Hematology   Anesthesia Other Findings   Reproductive/Obstetrics                           Anesthesia Physical Anesthesia Plan  ASA: II  Anesthesia Plan: MAC   Post-op Pain Management:    Induction: Intravenous  Airway Management Planned: Nasal Cannula  Additional Equipment:   Intra-op Plan:   Post-operative Plan:   Informed Consent: I have reviewed the patients History and Physical, chart, labs and discussed the procedure including the risks, benefits and alternatives for the proposed anesthesia with the patient or authorized representative who has indicated his/her understanding and acceptance.     Plan Discussed with:   Anesthesia Plan Comments:         Anesthesia Quick Evaluation  

## 2012-12-19 NOTE — Anesthesia Postprocedure Evaluation (Signed)
  Anesthesia Post-op Note  Patient: Zachary [name]  Procedure(s) Performed: Procedure(s) (LRB): CATARACT EXTRACTION PHACO AND INTRAOCULAR LENS PLACEMENT (IOC) (Left)  Patient Location:  Short Stay  Anesthesia Type: MAC  Level of Consciousness: awake  Airway and Oxygen Therapy: Patient Spontanous Breathing  Post-op Pain: none  Post-op Assessment: Post-op Vital signs reviewed, Patient's Cardiovascular Status Stable, Respiratory Function Stable, Patent Airway, No signs of Nausea or vomiting and Pain level controlled  Post-op Vital Signs: Reviewed and stable  Complications: No apparent anesthesia complications

## 2012-12-19 NOTE — Anesthesia Procedure Notes (Signed)
Procedure Name: MAC Date/Time: 12/19/2012 7:20 AM Performed by: Franco Nones Pre-anesthesia Checklist: Patient identified, Emergency Drugs available, Suction available, Timeout performed and Patient being monitored Patient Re-evaluated:Patient Re-evaluated prior to inductionOxygen Delivery Method: Nasal Cannula

## 2012-12-23 ENCOUNTER — Encounter (HOSPITAL_COMMUNITY): Payer: Self-pay | Admitting: Ophthalmology

## 2013-01-02 DIAGNOSIS — H40059 Ocular hypertension, unspecified eye: Secondary | ICD-10-CM | POA: Diagnosis not present

## 2013-01-02 DIAGNOSIS — H40049 Steroid responder, unspecified eye: Secondary | ICD-10-CM | POA: Diagnosis not present

## 2013-01-02 DIAGNOSIS — H2510 Age-related nuclear cataract, unspecified eye: Secondary | ICD-10-CM | POA: Diagnosis not present

## 2013-01-03 ENCOUNTER — Encounter (HOSPITAL_COMMUNITY): Payer: Self-pay | Admitting: Pharmacy Technician

## 2013-01-06 ENCOUNTER — Encounter (HOSPITAL_COMMUNITY)
Admission: RE | Admit: 2013-01-06 | Discharge: 2013-01-06 | Payer: Medicare Other | Source: Ambulatory Visit | Admitting: Ophthalmology

## 2013-01-06 ENCOUNTER — Encounter (HOSPITAL_COMMUNITY): Payer: Self-pay

## 2013-01-06 MED ORDER — LIDOCAINE HCL 3.5 % OP GEL
OPHTHALMIC | Status: AC
  Filled 2013-01-06: qty 5

## 2013-01-06 MED ORDER — NEOMYCIN-POLYMYXIN-DEXAMETH 3.5-10000-0.1 OP OINT
TOPICAL_OINTMENT | OPHTHALMIC | Status: AC
  Filled 2013-01-06: qty 3.5

## 2013-01-06 MED ORDER — FENTANYL CITRATE 0.05 MG/ML IJ SOLN: 25.0000 ug | INTRAMUSCULAR | Status: DC | PRN

## 2013-01-06 MED ORDER — PHENYLEPHRINE HCL 2.5 % OP SOLN
OPHTHALMIC | Status: AC
  Filled 2013-01-06: qty 2

## 2013-01-06 MED ORDER — ONDANSETRON HCL 4 MG/2ML IJ SOLN: 4.0000 mg | Freq: Once | INTRAMUSCULAR | Status: AC | PRN

## 2013-01-06 MED ORDER — TETRACAINE HCL 0.5 % OP SOLN
OPHTHALMIC | Status: AC
  Filled 2013-01-06: qty 2

## 2013-01-06 MED ORDER — CYCLOPENTOLATE-PHENYLEPHRINE 0.2-1 % OP SOLN
OPHTHALMIC | Status: AC
  Filled 2013-01-06: qty 2

## 2013-01-06 MED ORDER — LIDOCAINE HCL (PF) 1 % IJ SOLN
INTRAMUSCULAR | Status: AC
  Filled 2013-01-06: qty 2

## 2013-01-09 ENCOUNTER — Encounter (HOSPITAL_COMMUNITY): Payer: Self-pay | Admitting: *Deleted

## 2013-01-09 ENCOUNTER — Ambulatory Visit (HOSPITAL_COMMUNITY)
Admission: RE | Admit: 2013-01-09 | Discharge: 2013-01-09 | Disposition: A | Discharge status: Home Health | Payer: Medicare Other | Source: Ambulatory Visit | Attending: Ophthalmology | Admitting: Ophthalmology

## 2013-01-09 ENCOUNTER — Encounter (HOSPITAL_COMMUNITY)
Admission: RE | Disposition: A | Discharge status: Home Health | Payer: Self-pay | Source: Ambulatory Visit | Attending: Ophthalmology

## 2013-01-09 ENCOUNTER — Encounter (HOSPITAL_COMMUNITY): Payer: Self-pay | Admitting: Anesthesiology

## 2013-01-09 ENCOUNTER — Ambulatory Visit (HOSPITAL_COMMUNITY): Payer: Medicare Other | Admitting: Anesthesiology

## 2013-01-09 DIAGNOSIS — H2510 Age-related nuclear cataract, unspecified eye: Secondary | ICD-10-CM | POA: Diagnosis not present

## 2013-01-09 DIAGNOSIS — I10 Essential (primary) hypertension: Secondary | ICD-10-CM | POA: Insufficient documentation

## 2013-01-09 DIAGNOSIS — H269 Unspecified cataract: Secondary | ICD-10-CM | POA: Diagnosis not present

## 2013-01-09 HISTORY — PX: CATARACT EXTRACTION W/PHACO: SHX586

## 2013-01-09 SURGERY — PHACOEMULSIFICATION, CATARACT, WITH IOL INSERTION
Anesthesia: Monitor Anesthesia Care | Site: Eye | Laterality: Right | Wound class: Clean

## 2013-01-09 MED ORDER — PHENYLEPHRINE HCL 2.5 % OP SOLN
1.0000 [drp] | OPHTHALMIC | Status: AC
  Administered 2013-01-09 (×3): 1 [drp] via OPHTHALMIC

## 2013-01-09 MED ORDER — LIDOCAINE HCL 3.5 % OP GEL
1.0000 "application " | Freq: Once | OPHTHALMIC | Status: AC
  Administered 2013-01-09: 1 via OPHTHALMIC

## 2013-01-09 MED ORDER — MIDAZOLAM HCL 2 MG/2ML IJ SOLN
1.0000 mg | INTRAMUSCULAR | Status: DC | PRN
  Administered 2013-01-09 (×2): 1 mg via INTRAVENOUS

## 2013-01-09 MED ORDER — CYCLOPENTOLATE-PHENYLEPHRINE 0.2-1 % OP SOLN
1.0000 [drp] | OPHTHALMIC | Status: AC
  Administered 2013-01-09 (×3): 1 [drp] via OPHTHALMIC

## 2013-01-09 MED ORDER — MIDAZOLAM HCL 5 MG/5ML IJ SOLN
INTRAMUSCULAR | Status: DC | PRN
  Administered 2013-01-09: 2 mg via INTRAVENOUS

## 2013-01-09 MED ORDER — LACTATED RINGERS IV SOLN
INTRAVENOUS | Status: DC
  Administered 2013-01-09: 1000 mL via INTRAVENOUS

## 2013-01-09 MED ORDER — MIDAZOLAM HCL 2 MG/2ML IJ SOLN
INTRAMUSCULAR | Status: AC
  Filled 2013-01-09: qty 2

## 2013-01-09 MED ORDER — NEOMYCIN-POLYMYXIN-DEXAMETH 0.1 % OP OINT
TOPICAL_OINTMENT | OPHTHALMIC | Status: DC | PRN
  Administered 2013-01-09: 1 via OPHTHALMIC

## 2013-01-09 MED ORDER — LIDOCAINE HCL (PF) 1 % IJ SOLN
INTRAMUSCULAR | Status: DC | PRN
  Administered 2013-01-09: .4 mL

## 2013-01-09 MED ORDER — POVIDONE-IODINE 5 % OP SOLN
OPHTHALMIC | Status: DC | PRN
  Administered 2013-01-09: 1 via OPHTHALMIC

## 2013-01-09 MED ORDER — NA HYALUR & NA CHOND-NA HYALUR 0.55-0.5 ML IO KIT
PACK | INTRAOCULAR | Status: DC | PRN
  Administered 2013-01-09: 1 via OPHTHALMIC

## 2013-01-09 MED ORDER — EPINEPHRINE HCL 1 MG/ML IJ SOLN
INTRAOCULAR | Status: DC | PRN
  Administered 2013-01-09: 09:00:00

## 2013-01-09 MED ORDER — LIDOCAINE 3.5 % OP GEL OPTIME - NO CHARGE
OPHTHALMIC | Status: DC | PRN
  Administered 2013-01-09: 1 [drp] via OPHTHALMIC

## 2013-01-09 MED ORDER — BSS IO SOLN
INTRAOCULAR | Status: DC | PRN
  Administered 2013-01-09: 15 mL via INTRAOCULAR

## 2013-01-09 MED ORDER — PROVISC 10 MG/ML IO SOLN: INTRAOCULAR | Status: DC | PRN

## 2013-01-09 MED ORDER — EPINEPHRINE HCL 1 MG/ML IJ SOLN
INTRAMUSCULAR | Status: AC
  Filled 2013-01-09: qty 1

## 2013-01-09 MED ORDER — TETRACAINE HCL 0.5 % OP SOLN
1.0000 [drp] | OPHTHALMIC | Status: AC
  Administered 2013-01-09 (×3): 1 [drp] via OPHTHALMIC

## 2013-01-09 SURGICAL SUPPLY — 32 items
CAPSULAR TENSION RING-AMO (OPHTHALMIC RELATED) IMPLANT
CLOTH BEACON ORANGE TIMEOUT ST (SAFETY) ×2 IMPLANT
EYE SHIELD UNIVERSAL CLEAR (GAUZE/BANDAGES/DRESSINGS) ×2 IMPLANT
GLOVE BIO SURGEON STRL SZ 6.5 (GLOVE) IMPLANT
GLOVE BIOGEL PI IND STRL 6.5 (GLOVE) IMPLANT
GLOVE BIOGEL PI IND STRL 7.0 (GLOVE) ×1 IMPLANT
GLOVE BIOGEL PI IND STRL 7.5 (GLOVE) IMPLANT
GLOVE BIOGEL PI INDICATOR 6.5 (GLOVE)
GLOVE BIOGEL PI INDICATOR 7.0 (GLOVE) ×1
GLOVE BIOGEL PI INDICATOR 7.5 (GLOVE)
GLOVE ECLIPSE 6.5 STRL STRAW (GLOVE) IMPLANT
GLOVE ECLIPSE 7.0 STRL STRAW (GLOVE) IMPLANT
GLOVE ECLIPSE 7.5 STRL STRAW (GLOVE) IMPLANT
GLOVE EXAM NITRILE LRG STRL (GLOVE) IMPLANT
GLOVE EXAM NITRILE MD LF STRL (GLOVE) ×2 IMPLANT
GLOVE SKINSENSE NS SZ6.5 (GLOVE)
GLOVE SKINSENSE NS SZ7.0 (GLOVE)
GLOVE SKINSENSE STRL SZ6.5 (GLOVE) IMPLANT
GLOVE SKINSENSE STRL SZ7.0 (GLOVE) IMPLANT
KIT VITRECTOMY (OPHTHALMIC RELATED) IMPLANT
PAD ARMBOARD 7.5X6 YLW CONV (MISCELLANEOUS) ×2 IMPLANT
PROC W NO LENS (INTRAOCULAR LENS)
PROC W SPEC LENS (INTRAOCULAR LENS)
PROCESS W NO LENS (INTRAOCULAR LENS) IMPLANT
PROCESS W SPEC LENS (INTRAOCULAR LENS) IMPLANT
RING MALYGIN (MISCELLANEOUS) IMPLANT
SIGHTPATH CAT PROC W REG LENS (Ophthalmic Related) ×2 IMPLANT
SYR TB 1ML LL NO SAFETY (SYRINGE) ×2 IMPLANT
TAPE SURG TRANSPORE 1 IN (GAUZE/BANDAGES/DRESSINGS) ×1 IMPLANT
TAPE SURGICAL TRANSPORE 1 IN (GAUZE/BANDAGES/DRESSINGS) ×1
VISCOELASTIC ADDITIONAL (OPHTHALMIC RELATED) IMPLANT
WATER STERILE IRR 250ML POUR (IV SOLUTION) ×2 IMPLANT

## 2013-01-09 NOTE — Transfer of Care (Signed)
Immediate Anesthesia Transfer of Care Note  Patient: Zachary [name]  Procedure(s) Performed: Procedure(s) (LRB) with comments: CATARACT EXTRACTION PHACO AND INTRAOCULAR LENS PLACEMENT (IOC) (Right) - CDE:22.83  Patient Location: PACU and Short Stay  Anesthesia Type:MAC  Level of Consciousness: awake  Airway & Oxygen Therapy: Patient Spontanous Breathing  Post-op Assessment: Report given to PACU RN  Post vital signs: Reviewed  Complications: No apparent anesthesia complications

## 2013-01-09 NOTE — Anesthesia Postprocedure Evaluation (Signed)
  Anesthesia Post-op Note  Patient: Zachary [name]  Procedure(s) Performed: Procedure(s) (LRB) with comments: CATARACT EXTRACTION PHACO AND INTRAOCULAR LENS PLACEMENT (IOC) (Right) - CDE:22.83  Patient Location: PACU and Short Stay  Anesthesia Type:MAC  Level of Consciousness: awake, alert  and oriented  Airway and Oxygen Therapy: Patient Spontanous Breathing  Post-op Pain: none  Post-op Assessment: Post-op Vital signs reviewed, Patient's Cardiovascular Status Stable, Respiratory Function Stable, Patent Airway and No signs of Nausea or vomiting  Post-op Vital Signs: Reviewed and stable  Complications: No apparent anesthesia complications

## 2013-01-09 NOTE — Anesthesia Preprocedure Evaluation (Signed)
Anesthesia Evaluation  Patient identified by MRN, date of birth, ID band Patient awake    Reviewed: Allergy & Precautions, H&P , NPO status , Patient's Chart, lab work & pertinent test results  History of Anesthesia Complications Negative for: history of anesthetic complications  Airway Mallampati: II TM Distance: >3 FB     Dental  (+) Teeth Intact and Implants,    Pulmonary shortness of breath, sleep apnea ,          Cardiovascular hypertension,     Neuro/Psych  Headaches, PSYCHIATRIC DISORDERS Anxiety Depression    GI/Hepatic   Endo/Other    Renal/GU      Musculoskeletal   Abdominal   Peds  Hematology   Anesthesia Other Findings   Reproductive/Obstetrics                           Anesthesia Physical Anesthesia Plan  ASA: II  Anesthesia Plan: MAC   Post-op Pain Management:    Induction: Intravenous  Airway Management Planned: Nasal Cannula  Additional Equipment:   Intra-op Plan:   Post-operative Plan:   Informed Consent: I have reviewed the patients History and Physical, chart, labs and discussed the procedure including the risks, benefits and alternatives for the proposed anesthesia with the patient or authorized representative who has indicated his/her understanding and acceptance.     Plan Discussed with:   Anesthesia Plan Comments:         Anesthesia Quick Evaluation

## 2013-01-09 NOTE — H&P (Signed)
I have reviewed the H&P, the patient was re-examined, and I have identified no interval changes in medical condition and plan of care since the history and physical of record  

## 2013-01-09 NOTE — Brief Op Note (Signed)
Pre-Op Dx: Cataract OD Post-Op Dx: Cataract OD Surgeon: Amiri Riechers Anesthesia: Topical with MAC Surgery: Cataract Extraction with Intraocular lens Implant OD Implant: B&L enVista Specimen: None Complications: None 

## 2013-01-10 NOTE — Op Note (Signed)
NAME:  TAYLEN, OSORTO NO.:  [id number]  MEDICAL RECORD NO.:  [id number]  LOCATION:  [facility]                          FACILITY:  [facility]  PHYSICIAN:  Susanne Greenhouse, MD       DATE OF BIRTH:  16-Apr-1946  DATE OF PROCEDURE:  01/09/2013 DATE OF DISCHARGE:  01/09/2013                              OPERATIVE REPORT   PREOPERATIVE DIAGNOSIS:  Nuclear cataract, right eye, diagnosis code 366.16.  POSTOPERATIVE DIAGNOSIS:  Nuclear cataract, right eye, diagnosis code 366.16.  PROCEDURE: Phacoemulsification, intraocular lens implant, right eye.  ANESTHESIA:  Topical with monitored anesthesia care and IV sedation.  OPERATIVE SUMMARY:  In the preoperative area, dilating drops were placed into the right eye.  The patient was then brought into the operating room where he was placed under topical anesthesia and IV sedation.  The eye was then prepped and draped.  Beginning with a 75 blade, a paracentesis port was made at the surgeon's 2 o'clock position.  The anterior chamber was then filled with a 1% nonpreserved lidocaine solution with epinephrine.  This was followed by Viscoat to deepen the chamber.  A small fornix-based peritomy was performed superiorly.  Next, a single iris hook was placed through the limbus superiorly.  A 2.4-mm keratome blade was then used to make a clear corneal incision over the iris hook.  A bent cystotome needle and Utrata forceps were used to create a continuous tear capsulotomy.  Hydrodissection was performed using balanced salt solution on a fine cannula.  The lens nucleus was then removed using phacoemulsification in a quadrant cracking technique. The cortical material was then removed with irrigation and aspiration. The capsular bag and anterior chamber were refilled with Provisc.  The wound was widened to approximately 3 mm and a posterior chamber intraocular lens was placed into the capsular bag without difficulty using an [employer] lens  injecting system.  A single 10-0 nylon suture was then used to close the incision as well as stromal hydration. The Provisc was removed from the anterior chamber and capsular bag with irrigation and aspiration.  At this point, the wounds were tested for leak, which were negative.  The anterior chamber remained deep and stable.  The patient tolerated the procedure well.  There were no operative complications, and he awoke from topical anesthesia and IV sedation without problem.  No surgical specimens.  Prosthetic device used is a B[employer]ch [employer] Lomb posterior chamber lens, model enVista, model #MX60, power of 26.0, serial number is [id number].         ______________________________ Susanne Greenhouse, MD    KEH/MEDQ  D:  01/09/2013  T:  01/10/2013  Job:  [id number]

## 2013-01-12 ENCOUNTER — Encounter (HOSPITAL_COMMUNITY): Payer: Self-pay | Admitting: Ophthalmology

## 2013-02-11 ENCOUNTER — Other Ambulatory Visit: Payer: Self-pay

## 2013-02-17 ENCOUNTER — Ambulatory Visit (HOSPITAL_COMMUNITY): Payer: Self-pay | Admitting: Psychiatry

## 2013-02-22 ENCOUNTER — Ambulatory Visit (INDEPENDENT_AMBULATORY_CARE_PROVIDER_SITE_OTHER): Payer: Medicare Other | Admitting: Orthopedic Surgery

## 2013-02-22 ENCOUNTER — Encounter: Payer: Self-pay | Admitting: Orthopedic Surgery

## 2013-02-22 VITALS — Wt 238.0 lb

## 2013-02-22 DIAGNOSIS — M659 Synovitis and tenosynovitis, unspecified: Secondary | ICD-10-CM | POA: Diagnosis not present

## 2013-02-22 DIAGNOSIS — M179 Osteoarthritis of knee, unspecified: Secondary | ICD-10-CM | POA: Insufficient documentation

## 2013-02-22 DIAGNOSIS — M65979 Unspecified synovitis and tenosynovitis, unspecified ankle and foot: Secondary | ICD-10-CM

## 2013-02-22 DIAGNOSIS — M7672 Peroneal tendinitis, left leg: Secondary | ICD-10-CM

## 2013-02-22 NOTE — Progress Notes (Signed)
Patient ID: Zachary [name], male   DOB: Jan 02, 1946, 67 y.o.   MRN: [id number] Chief Complaint  Patient presents with  . Follow-up    Followup left ankle and foot    The patient comes in today to reevaluate his left ankle and foot he has tenosynovitis and arthritis he had an MRI does not look surgical at this point the knee surgery in the future however he says his ankle and foot are much improved not have any trouble with it right now actually.  He says his left knee is hurting he said he reinjured it after moving a heavy piece of furniture. He injured it about 3-4 weeks ago. He complains of lateral knee pain some swelling some stiffness pain is nonradiating lateral laterally over the lateral compartment  Review of systems no new findings under musculoskeletal  Exam his knee is tender over the lateral aspect there appears to be a small joint effusion range of motion 115 knee stable strength normal skin intact good pulse is ambulating with a slight limp  Tenosynovitis of foot and ankle  Peroneal tendinitis, left  OA (osteoarthritis) of knee   Knee  Injection Procedure Note  Pre-operative Diagnosis: left knee oa  Post-operative Diagnosis: same  Indications: pain  Anesthesia: ethyl chloride   Procedure Details   Verbal consent was obtained for the procedure. Time out was completed.The joint was prepped with alcohol, followed by  Ethyl chloride spray and A 20 gauge needle was inserted into the knee via lateral approach; 4ml 1% lidocaine and 1 ml of depomedrol  was then injected into the joint . The needle was removed and the area cleansed and dressed.  Complications:  None; patient tolerated the procedure well.  Come back in 3 months

## 2013-02-22 NOTE — Patient Instructions (Addendum)
You have received a steroid shot. 15% of patients experience increased pain at the injection site with in the next 24 hours. This is best treated with ice and tylenol extra strength 2 tabs every 8 hours. If you are still having pain please call the office.    

## 2013-03-01 ENCOUNTER — Ambulatory Visit (INDEPENDENT_AMBULATORY_CARE_PROVIDER_SITE_OTHER): Payer: Medicare Other | Admitting: Psychiatry

## 2013-03-01 ENCOUNTER — Encounter (HOSPITAL_COMMUNITY): Payer: Self-pay | Admitting: Psychiatry

## 2013-03-01 VITALS — Wt 234.8 lb

## 2013-03-01 DIAGNOSIS — G4700 Insomnia, unspecified: Secondary | ICD-10-CM | POA: Insufficient documentation

## 2013-03-01 DIAGNOSIS — F489 Nonpsychotic mental disorder, unspecified: Secondary | ICD-10-CM | POA: Diagnosis not present

## 2013-03-01 MED ORDER — GABAPENTIN 100 MG PO CAPS: 100.0000 mg | ORAL_CAPSULE | Freq: Three times a day (TID) | ORAL | Status: DC | PRN

## 2013-03-01 MED ORDER — ESCITALOPRAM OXALATE 10 MG PO TABS: 10.0000 mg | ORAL_TABLET | Freq: Every day | ORAL | Status: DC

## 2013-03-01 NOTE — Progress Notes (Signed)
[facility] [id number] Progress Note Zachary [name] MRN: [id number] DOB: [birth year] Age: 66 y.o.  Date: 03/01/2013 Start Time: 10:00 AM End Time: 10:25 AM  Chief Complaint: Chief Complaint  Patient presents with  . Depression  . Follow-up  . Medication Refill   Subjective: "My doctor got me started on Valium for ringing my ears and it is affecting my mind, can't remember things and alertness".  History presenting illness. Patient is 67 year old Caucasian male who came for his followup appointment. He ran out of the Lexapro and can't afford them.  He has not noted any recurrence of depression or anxiety.  He is significantly bothered by the brain effect of the Valium.  Discussed him gradually tapering off the Valium or switching to Neurontin and he quickly insisted that he be switched to the Neurontin.   Current psychiatric medication. Lexapro 10 mg daily, none time 6 weeks Valium 5 mg as needed prescribed by his primary care physician.  Medical history Patient has history of back pain, hypertension, tinnitus, Obstructive sleep apnea, knee surgery and back surgery. Patient also has bladder cancer and carpal tunnel release.  His primary care physician is Dr. Margo Aye and he see Dr. Romeo Apple for his pain management.  Family History family history includes Alcohol abuse in his maternal grandfather and mother; Anxiety disorder in his sister; Cancer in his father; Depression in his mother; and Hypertension in his father, mother, and sister.  Mental status examination Patient is casually dressed fairly groomed. He is  anxious but cooperative.  He maintained fair eye contact. His speech is slow but clear coherent and fluent. His thought process is logical linear and goal-directed. He denies any auditory or visual hallucination. He denies any active or passive suicidal thinking and homicidal thinking. He described his mood is good and his affect is mood congruent. His attention and  concentration is good. He's alert and oriented x3. His insight judgment and impulse control is okay.  Lab Results:  Results for orders placed during the hospital encounter of 12/12/12 (from the past 8736 hour(s))  HEMOGLOBIN AND HEMATOCRIT, BLOOD   Collection Time    12/12/12  8:00 AM      Result Value Range   Hemoglobin 14.4  13.0 - 17.0 g/dL   HCT 86.5  78.4 - 69.6 %  BASIC METABOLIC PANEL   Collection Time    12/12/12  8:00 AM      Result Value Range   Sodium 141  135 - 145 mEq/L   Potassium 4.2  3.5 - 5.1 mEq/L   Chloride 104  96 - 112 mEq/L   CO2 27  19 - 32 mEq/L   Glucose, Bld 99  70 - 99 mg/dL   BUN 14  6 - 23 mg/dL   Creatinine, Ser 2.95  0.50 - 1.35 mg/dL   Calcium 9.0  8.4 - 28.4 mg/dL   GFR calc non Af Amer 84 (*) >90 mL/min   GFR calc Af Amer >90  >90 mL/min   Assessment Axis I Major depressive disorder, 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  I reviewed CC, tobacco/med/surg Hx, meds effects/ side effects, problem list, therapies and responses as well as current situation/symptoms discussed options. Switch the Valium to Neurontin. See orders and pt instructions for more details.  Medical Decision Making Problem Points:  Established problem, stable/improving (1), New problem, with  no additional work-up planned (3), Review of last therapy session (1) and Review of psycho-social stressors (1) Data Points:  Review or order clinical lab tests (1) Review of new medications or change in dosage (2)  I certify that outpatient services furnished can reasonably be expected to improve the patient's condition.   Orson Aloe, MD, [facility]

## 2013-03-01 NOTE — Patient Instructions (Signed)
Can use 1 or 2 of the Neurontin to help with getting to sleep at night.  Try using one a day for about 20-30 days initially then .  Call if problems or concerns.

## 2013-03-23 ENCOUNTER — Ambulatory Visit (INDEPENDENT_AMBULATORY_CARE_PROVIDER_SITE_OTHER): Payer: Medicare Other | Admitting: Otolaryngology

## 2013-03-23 DIAGNOSIS — H903 Sensorineural hearing loss, bilateral: Secondary | ICD-10-CM | POA: Diagnosis not present

## 2013-03-23 DIAGNOSIS — R42 Dizziness and giddiness: Secondary | ICD-10-CM

## 2013-03-23 DIAGNOSIS — J310 Chronic rhinitis: Secondary | ICD-10-CM | POA: Diagnosis not present

## 2013-03-29 ENCOUNTER — Ambulatory Visit (HOSPITAL_COMMUNITY): Payer: Self-pay | Admitting: Psychiatry

## 2013-04-04 ENCOUNTER — Encounter (HOSPITAL_COMMUNITY): Payer: Self-pay | Admitting: Psychiatry

## 2013-04-04 ENCOUNTER — Ambulatory Visit (HOSPITAL_COMMUNITY): Payer: Self-pay | Admitting: Psychiatry

## 2013-04-04 ENCOUNTER — Ambulatory Visit (INDEPENDENT_AMBULATORY_CARE_PROVIDER_SITE_OTHER): Payer: Medicare Other | Admitting: Psychiatry

## 2013-04-04 VITALS — Wt 245.8 lb

## 2013-04-04 DIAGNOSIS — G988 Other disorders of nervous system: Secondary | ICD-10-CM | POA: Diagnosis not present

## 2013-04-04 DIAGNOSIS — G969 Disorder of central nervous system, unspecified: Secondary | ICD-10-CM

## 2013-04-04 DIAGNOSIS — F329 Major depressive disorder, single episode, unspecified: Secondary | ICD-10-CM | POA: Diagnosis not present

## 2013-04-04 DIAGNOSIS — F418 Other specified anxiety disorders: Secondary | ICD-10-CM

## 2013-04-04 DIAGNOSIS — F341 Dysthymic disorder: Secondary | ICD-10-CM

## 2013-04-04 DIAGNOSIS — M62838 Other muscle spasm: Secondary | ICD-10-CM | POA: Insufficient documentation

## 2013-04-04 DIAGNOSIS — F5105 Insomnia due to other mental disorder: Secondary | ICD-10-CM

## 2013-04-04 MED ORDER — CARBAMAZEPINE ER 200 MG PO TB12: ORAL_TABLET | ORAL | Status: DC

## 2013-04-04 MED ORDER — BACLOFEN 10 MG PO TABS: 10.0000 mg | ORAL_TABLET | Freq: Three times a day (TID) | ORAL | Status: DC

## 2013-04-04 MED ORDER — GABAPENTIN 100 MG PO CAPS: 100.0000 mg | ORAL_CAPSULE | Freq: Three times a day (TID) | ORAL | Status: DC | PRN
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
## 2013-04-04 NOTE — Patient Instructions (Addendum)
.  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 These 3 things are the foundation of rehabilitating your brain. Staying off all abusable substances including nicotine, caffeine, and refined sugar and avoiding further head injuries are the other important elements in helping you keep your brain working the best it can for you. If memory is a problem then INSTEAD of the fish oil mentioned above, try using Brain Power Basics from [employer].  You can order online or by phone [phone]. It costs $99 for the first month, and $80 monthly thereafter, but that investment in your brain and the recovery of your brain proper functioning would seem worth it.  TAKE LEXAPRO, NEURONTIN, TEGRETOL, BACLOFEN may take an extra Neurontin is you feel the need for a Valium.  Strongly consider attending at least 6 [employer] Meetings to help you learn about how your helping others to the exclusion of helping yourself is actually hurting yourself and is actually an addiction to fixing others and that you need to work the 12 Step to Happiness through the [employer]. Al-Anon Family Groups could be helpful with how to deal with substance abusing family and friends. Or your own issues of being in victim role.  There are only 40 Alanon Family Group meetings a week here in [place].  Online are current listing of those meetings @ [email or web address]  There are [employer].  Search on line and there you can learn the format and can access the schedule for yourself.  Their number is [phone]  Take care of yourself.  No one else is standing up to do the job and only you know what you need.   GET SERIOUS about taking care of yourself.  Do the next right thing and that often means doing something to care for yourself along the lines of are you hungry, are you angry,  are you lonely, are you tired, are you scared?  [employer] is what that stands for.  Call if problems or concerns.

## 2013-04-04 NOTE — Addendum Note (Signed)
Addended by: Mike Craze on: 04/04/2013 12:07 PM   Modules accepted: Orders

## 2013-04-10 DIAGNOSIS — G988 Other disorders of nervous system: Secondary | ICD-10-CM | POA: Diagnosis not present

## 2013-04-13 DIAGNOSIS — R7309 Other abnormal glucose: Secondary | ICD-10-CM | POA: Diagnosis not present

## 2013-04-13 DIAGNOSIS — G4733 Obstructive sleep apnea (adult) (pediatric): Secondary | ICD-10-CM | POA: Diagnosis not present

## 2013-04-13 DIAGNOSIS — E785 Hyperlipidemia, unspecified: Secondary | ICD-10-CM | POA: Diagnosis not present

## 2013-04-13 DIAGNOSIS — R7301 Impaired fasting glucose: Secondary | ICD-10-CM | POA: Diagnosis not present

## 2013-04-13 DIAGNOSIS — N529 Male erectile dysfunction, unspecified: Secondary | ICD-10-CM | POA: Diagnosis not present

## 2013-04-13 DIAGNOSIS — Z125 Encounter for screening for malignant neoplasm of prostate: Secondary | ICD-10-CM | POA: Diagnosis not present

## 2013-04-13 DIAGNOSIS — I10 Essential (primary) hypertension: Secondary | ICD-10-CM | POA: Diagnosis not present

## 2013-04-24 DIAGNOSIS — M542 Cervicalgia: Secondary | ICD-10-CM | POA: Diagnosis not present

## 2013-04-25 DIAGNOSIS — M549 Dorsalgia, unspecified: Secondary | ICD-10-CM | POA: Diagnosis not present

## 2013-04-25 DIAGNOSIS — R05 Cough: Secondary | ICD-10-CM | POA: Diagnosis not present

## 2013-04-25 DIAGNOSIS — R51 Headache: Secondary | ICD-10-CM | POA: Diagnosis not present

## 2013-04-28 ENCOUNTER — Encounter (HOSPITAL_COMMUNITY): Payer: Self-pay | Admitting: Emergency Medicine

## 2013-04-28 ENCOUNTER — Emergency Department (HOSPITAL_COMMUNITY)
Admission: EM | Admit: 2013-04-28 | Discharge: 2013-04-29 | Disposition: A | Discharge status: Home / Self Care | Payer: Medicare Other | Attending: Emergency Medicine | Admitting: Emergency Medicine

## 2013-04-28 ENCOUNTER — Emergency Department (HOSPITAL_COMMUNITY): Payer: Medicare Other

## 2013-04-28 DIAGNOSIS — J3489 Other specified disorders of nose and nasal sinuses: Secondary | ICD-10-CM | POA: Diagnosis not present

## 2013-04-28 DIAGNOSIS — F32A Depression, unspecified: Secondary | ICD-10-CM

## 2013-04-28 DIAGNOSIS — E876 Hypokalemia: Secondary | ICD-10-CM

## 2013-04-28 DIAGNOSIS — G8921 Chronic pain due to trauma: Secondary | ICD-10-CM | POA: Insufficient documentation

## 2013-04-28 DIAGNOSIS — Z87442 Personal history of urinary calculi: Secondary | ICD-10-CM | POA: Diagnosis not present

## 2013-04-28 DIAGNOSIS — I10 Essential (primary) hypertension: Secondary | ICD-10-CM | POA: Insufficient documentation

## 2013-04-28 DIAGNOSIS — R45851 Suicidal ideations: Secondary | ICD-10-CM | POA: Diagnosis not present

## 2013-04-28 DIAGNOSIS — Z8551 Personal history of malignant neoplasm of bladder: Secondary | ICD-10-CM | POA: Insufficient documentation

## 2013-04-28 DIAGNOSIS — N390 Urinary tract infection, site not specified: Secondary | ICD-10-CM

## 2013-04-28 DIAGNOSIS — F3289 Other specified depressive episodes: Secondary | ICD-10-CM | POA: Insufficient documentation

## 2013-04-28 DIAGNOSIS — N289 Disorder of kidney and ureter, unspecified: Secondary | ICD-10-CM

## 2013-04-28 DIAGNOSIS — G4733 Obstructive sleep apnea (adult) (pediatric): Secondary | ICD-10-CM | POA: Insufficient documentation

## 2013-04-28 DIAGNOSIS — Z79899 Other long term (current) drug therapy: Secondary | ICD-10-CM | POA: Insufficient documentation

## 2013-04-28 DIAGNOSIS — R509 Fever, unspecified: Secondary | ICD-10-CM | POA: Diagnosis not present

## 2013-04-28 DIAGNOSIS — M549 Dorsalgia, unspecified: Secondary | ICD-10-CM | POA: Diagnosis not present

## 2013-04-28 DIAGNOSIS — F329 Major depressive disorder, single episode, unspecified: Secondary | ICD-10-CM | POA: Diagnosis not present

## 2013-04-28 DIAGNOSIS — F29 Unspecified psychosis not due to a substance or known physiological condition: Secondary | ICD-10-CM | POA: Insufficient documentation

## 2013-04-28 DIAGNOSIS — J189 Pneumonia, unspecified organism: Secondary | ICD-10-CM

## 2013-04-28 DIAGNOSIS — F411 Generalized anxiety disorder: Secondary | ICD-10-CM | POA: Diagnosis not present

## 2013-04-28 DIAGNOSIS — E860 Dehydration: Secondary | ICD-10-CM | POA: Diagnosis not present

## 2013-04-28 DIAGNOSIS — Z862 Personal history of diseases of the blood and blood-forming organs and certain disorders involving the immune mechanism: Secondary | ICD-10-CM | POA: Diagnosis not present

## 2013-04-28 DIAGNOSIS — Z8639 Personal history of other endocrine, nutritional and metabolic disease: Secondary | ICD-10-CM | POA: Insufficient documentation

## 2013-04-28 DIAGNOSIS — Z87891 Personal history of nicotine dependence: Secondary | ICD-10-CM | POA: Diagnosis not present

## 2013-04-28 DIAGNOSIS — R918 Other nonspecific abnormal finding of lung field: Secondary | ICD-10-CM | POA: Diagnosis not present

## 2013-04-28 DIAGNOSIS — R1909 Other intra-abdominal and pelvic swelling, mass and lump: Secondary | ICD-10-CM | POA: Insufficient documentation

## 2013-04-28 LAB — CBC WITH DIFFERENTIAL/PLATELET
Basophils Absolute: 0 10*3/uL (ref 0.0–0.1)
Eosinophils Absolute: 0.2 10*3/uL (ref 0.0–0.7)
Eosinophils Relative: 2 % (ref 0–5)
HCT: 36.7 % — ABNORMAL LOW (ref 39.0–52.0)
Lymphocytes Relative: 7 % — ABNORMAL LOW (ref 12–46)
MCH: 30.2 pg (ref 26.0–34.0)
MCHC: 34.1 g/dL (ref 30.0–36.0)
MCV: 88.6 fL (ref 78.0–100.0)
Monocytes Absolute: 0.9 10*3/uL (ref 0.1–1.0)
RDW: 13.2 % (ref 11.5–15.5)
WBC: 11.2 10*3/uL — ABNORMAL HIGH (ref 4.0–10.5)

## 2013-04-28 LAB — RAPID URINE DRUG SCREEN, HOSP PERFORMED
Amphetamines: NOT DETECTED
Barbiturates: NOT DETECTED
Benzodiazepines: NOT DETECTED
Cocaine: NOT DETECTED
Tetrahydrocannabinol: NOT DETECTED

## 2013-04-28 LAB — URINALYSIS, ROUTINE W REFLEX MICROSCOPIC
Bilirubin Urine: NEGATIVE
Hgb urine dipstick: NEGATIVE
Ketones, ur: NEGATIVE mg/dL
Nitrite: NEGATIVE
Specific Gravity, Urine: 1.02 (ref 1.005–1.030)
pH: 5 (ref 5.0–8.0)

## 2013-04-28 LAB — COMPREHENSIVE METABOLIC PANEL
AST: 30 U/L (ref 0–37)
CO2: 27 mEq/L (ref 19–32)
Calcium: 8.7 mg/dL (ref 8.4–10.5)
Creatinine, Ser: 1.59 mg/dL — ABNORMAL HIGH (ref 0.50–1.35)
GFR calc Af Amer: 51 mL/min — ABNORMAL LOW (ref >90)
GFR calc non Af Amer: 44 mL/min — ABNORMAL LOW (ref >90)
Total Protein: 7.6 g/dL (ref 6.0–8.3)

## 2013-04-28 LAB — URINE MICROSCOPIC-ADD ON

## 2013-04-28 MED ORDER — KETOROLAC TROMETHAMINE 60 MG/2ML IM SOLN
60.0000 mg | Freq: Once | INTRAMUSCULAR | Status: AC
  Administered 2013-04-28: 60 mg via INTRAMUSCULAR
  Filled 2013-04-28: qty 2

## 2013-04-28 MED ORDER — DM-GUAIFENESIN ER 30-600 MG PO TB12
1.0000 | ORAL_TABLET | Freq: Two times a day (BID) | ORAL | Status: DC
  Administered 2013-04-28: 1 via ORAL
  Filled 2013-04-28: qty 1

## 2013-04-28 MED ORDER — SODIUM CHLORIDE 0.9 % IV SOLN
1000.0000 mL | Freq: Once | INTRAVENOUS | Status: AC
  Administered 2013-04-28: 1000 mL via INTRAVENOUS

## 2013-04-28 MED ORDER — ONDANSETRON HCL 4 MG/2ML IJ SOLN
4.0000 mg | Freq: Once | INTRAMUSCULAR | Status: AC
  Administered 2013-04-28: 4 mg via INTRAVENOUS

## 2013-04-28 MED ORDER — POTASSIUM CHLORIDE CRYS ER 20 MEQ PO TBCR
40.0000 meq | EXTENDED_RELEASE_TABLET | Freq: Once | ORAL | Status: AC
  Administered 2013-04-28: 40 meq via ORAL
  Filled 2013-04-28: qty 2

## 2013-04-28 MED ORDER — METHOCARBAMOL 500 MG PO TABS
1500.0000 mg | ORAL_TABLET | Freq: Once | ORAL | Status: AC
  Administered 2013-04-28: 1500 mg via ORAL
  Filled 2013-04-28: qty 3

## 2013-04-28 MED ORDER — AZITHROMYCIN 250 MG PO TABS
500.0000 mg | ORAL_TABLET | Freq: Once | ORAL | Status: AC
  Administered 2013-04-28: 500 mg via ORAL
  Filled 2013-04-28: qty 2

## 2013-04-28 MED ORDER — DEXTROSE 5 % IV SOLN
1.0000 g | Freq: Once | INTRAVENOUS | Status: AC
  Administered 2013-04-28: 1 g via INTRAVENOUS
  Filled 2013-04-28: qty 10

## 2013-04-28 MED ORDER — ONDANSETRON HCL 4 MG/2ML IJ SOLN
INTRAMUSCULAR | Status: AC
  Filled 2013-04-28: qty 2

## 2013-04-28 MED ORDER — SODIUM CHLORIDE 0.9 % IV SOLN: 1000.0000 mL | INTRAVENOUS | Status: DC

## 2013-04-28 NOTE — ED Notes (Signed)
Pt states a cough for week & pain in his elbow. Pt did not mention wanting to Denes himself when asked what was going on tonight.

## 2013-04-28 NOTE — ED Notes (Signed)
Moved to room 6 per order Iva Lento, PA.

## 2013-04-28 NOTE — ED Notes (Signed)
EDP states pt told her in room he wanted to shot himself. Pt moved to room 16 to be watched by sitter. Pt in gown, family in room w/ pt.

## 2013-04-28 NOTE — ED Notes (Signed)
Cough for 5 days, non productive. Pain lt shoulder and arm  , say he has a "pinched nerve" in his neck. Almost constant cough, advised by Dr Margo Aye to take robitussin.
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
## 2013-04-28 NOTE — ED Notes (Signed)
Pt c/o cough x 5 days. Fever at home. Pt also c/o left side neck pain radiating down left arm from pinched nerve.

## 2013-04-29 MED ORDER — GUAIFENESIN 100 MG/5ML PO LIQD: 100.0000 mg | ORAL | Status: DC | PRN

## 2013-04-29 MED ORDER — LEVOFLOXACIN 500 MG PO TABS: 500.0000 mg | ORAL_TABLET | Freq: Every day | ORAL | Status: DC

## 2013-04-29 NOTE — ED Notes (Signed)
Pt alert & oriented x4, stable gait. Patient given discharge instructions, paperwork & prescription(s). Patient  instructed to stop at the registration desk to finish any additional paperwork. Patient verbalized understanding. Pt left department w/ no further questions. 

## 2013-04-29 NOTE — ED Provider Notes (Signed)
2330 Assumed care/dispostion of patient. He is awaiting tele-psych.  0110 Tele-psych has been completed by Dr. Rob Bunting. He found the patient psychiatrically intact and able to be discharged. Discharge was completed.  Nicoletta Dress. Colon Branch, MD 04/29/13 0111

## 2013-05-01 ENCOUNTER — Telehealth (HOSPITAL_COMMUNITY): Payer: Self-pay | Admitting: Psychiatry

## 2013-05-01 ENCOUNTER — Ambulatory Visit (HOSPITAL_COMMUNITY): Payer: Self-pay | Admitting: Psychiatry

## 2013-05-01 DIAGNOSIS — J159 Unspecified bacterial pneumonia: Secondary | ICD-10-CM | POA: Diagnosis not present

## 2013-05-01 DIAGNOSIS — M25519 Pain in unspecified shoulder: Secondary | ICD-10-CM | POA: Diagnosis not present

## 2013-05-01 DIAGNOSIS — M25529 Pain in unspecified elbow: Secondary | ICD-10-CM | POA: Diagnosis not present

## 2013-05-02 ENCOUNTER — Ambulatory Visit (HOSPITAL_COMMUNITY): Admission: RE | Admit: 2013-05-02 | Payer: Medicare Other | Source: Ambulatory Visit | Admitting: Physical Therapy

## 2013-05-02 ENCOUNTER — Telehealth (HOSPITAL_COMMUNITY): Payer: Self-pay

## 2013-05-02 ENCOUNTER — Ambulatory Visit (HOSPITAL_COMMUNITY): Payer: Self-pay | Admitting: Psychiatry

## 2013-05-08 DIAGNOSIS — M7710 Lateral epicondylitis, unspecified elbow: Secondary | ICD-10-CM | POA: Diagnosis not present

## 2013-05-08 DIAGNOSIS — M542 Cervicalgia: Secondary | ICD-10-CM | POA: Diagnosis not present

## 2013-05-09 ENCOUNTER — Other Ambulatory Visit: Payer: Self-pay | Admitting: Orthopaedic Surgery

## 2013-05-09 ENCOUNTER — Ambulatory Visit
Admission: RE | Admit: 2013-05-09 | Discharge: 2013-05-09 | Disposition: A | Discharge status: Home / Self Care | Payer: Medicare Other | Source: Ambulatory Visit | Attending: Orthopaedic Surgery | Admitting: Orthopaedic Surgery

## 2013-05-09 DIAGNOSIS — M792 Neuralgia and neuritis, unspecified: Secondary | ICD-10-CM

## 2013-05-09 DIAGNOSIS — M47812 Spondylosis without myelopathy or radiculopathy, cervical region: Secondary | ICD-10-CM | POA: Diagnosis not present

## 2013-05-09 DIAGNOSIS — M4802 Spinal stenosis, cervical region: Secondary | ICD-10-CM | POA: Diagnosis not present

## 2013-05-09 DIAGNOSIS — M5030 Other cervical disc degeneration, unspecified cervical region: Secondary | ICD-10-CM | POA: Diagnosis not present

## 2013-05-09 DIAGNOSIS — M542 Cervicalgia: Secondary | ICD-10-CM

## 2013-05-11 DIAGNOSIS — M542 Cervicalgia: Secondary | ICD-10-CM | POA: Diagnosis not present

## 2013-05-15 DIAGNOSIS — M4800 Spinal stenosis, site unspecified: Secondary | ICD-10-CM | POA: Diagnosis not present

## 2013-05-15 DIAGNOSIS — M5412 Radiculopathy, cervical region: Secondary | ICD-10-CM | POA: Diagnosis not present

## 2013-05-15 DIAGNOSIS — H40049 Steroid responder, unspecified eye: Secondary | ICD-10-CM | POA: Diagnosis not present

## 2013-05-15 DIAGNOSIS — Z961 Presence of intraocular lens: Secondary | ICD-10-CM | POA: Diagnosis not present

## 2013-05-15 DIAGNOSIS — H2010 Chronic iridocyclitis, unspecified eye: Secondary | ICD-10-CM | POA: Diagnosis not present

## 2013-05-15 DIAGNOSIS — M5020 Other cervical disc displacement, unspecified cervical region: Secondary | ICD-10-CM | POA: Diagnosis not present

## 2013-05-18 ENCOUNTER — Telehealth: Payer: Self-pay | Admitting: Orthopedic Surgery

## 2013-05-18 NOTE — Telephone Encounter (Signed)
Patient called, cancelled his 47-month re-check appointment for knee, due to other medical issues, said has been treating with neuro in [facility] "for pinched nerve problem," per referral from primary care, Dr. Dwana Melena.  States his knee is better, and his ankle and foot are "doing great."  He is to call back to reschedule.

## 2013-05-25 ENCOUNTER — Ambulatory Visit: Payer: Self-pay | Admitting: Orthopedic Surgery

## 2013-05-30 ENCOUNTER — Encounter (HOSPITAL_COMMUNITY): Payer: Self-pay | Admitting: Pharmacist

## 2013-05-30 DIAGNOSIS — M5020 Other cervical disc displacement, unspecified cervical region: Secondary | ICD-10-CM | POA: Diagnosis not present

## 2013-05-31 NOTE — Pre-Procedure Instructions (Signed)
Zachary [name]  05/31/2013   Your procedure is scheduled on:  Monday, June 9th  Report to [facility] at 1230 PM. Come to main entrance "A" and go to east elevators up to 3rd floor. Check in at short stay desk.  Call this number if you have problems the morning of surgery: [phone]   Remember:   Do not eat food or drink liquids after midnight.   Take these medicines the morning of surgery with A SIP OF WATER: Lexapro, Valium if needed, eye drops, flonase, oxycodone if needed   Do not wear jewelry, make-up or nail polish.  Do not wear lotions, powders, or perfumes. You may wear deodorant.  Do not shave 48 hours prior to surgery. Men may shave face and neck.  Do not bring valuables to the hospital.  [facility] is not responsible    for any belongings or valuables.  Contacts, dentures or bridgework may not be worn into surgery.  Leave suitcase in the car. After surgery it may be brought to your room.  For patients admitted to the hospital, checkout time is 11:00 AM the day of  discharge.   Patients discharged the day of surgery will not be allowed to drive  home.   Special Instructions: Shower using CHG 2 nights before surgery and the night before surgery.  If you shower the day of surgery use CHG.  Use special wash - you have one bottle of CHG for all showers.  You should use approximately 1/3 of the bottle for each shower.   Please read over the following fact sheets that you were given: Pain Booklet, Coughing and Deep Breathing, MRSA Information and Surgical Site Infection Prevention

## 2013-06-01 ENCOUNTER — Encounter (HOSPITAL_COMMUNITY): Payer: Self-pay

## 2013-06-01 ENCOUNTER — Encounter (HOSPITAL_COMMUNITY)
Admission: RE | Admit: 2013-06-01 | Discharge: 2013-06-01 | Disposition: A | Discharge status: Home / Self Care | Payer: Medicare Other | Source: Ambulatory Visit | Attending: Orthopaedic Surgery | Admitting: Orthopaedic Surgery

## 2013-06-01 ENCOUNTER — Other Ambulatory Visit (HOSPITAL_COMMUNITY): Payer: Self-pay | Admitting: Orthopaedic Surgery

## 2013-06-01 DIAGNOSIS — M5020 Other cervical disc displacement, unspecified cervical region: Secondary | ICD-10-CM | POA: Diagnosis not present

## 2013-06-01 DIAGNOSIS — Z882 Allergy status to sulfonamides status: Secondary | ICD-10-CM | POA: Diagnosis not present

## 2013-06-01 DIAGNOSIS — R29898 Other symptoms and signs involving the musculoskeletal system: Secondary | ICD-10-CM | POA: Diagnosis not present

## 2013-06-01 DIAGNOSIS — M5382 Other specified dorsopathies, cervical region: Secondary | ICD-10-CM | POA: Diagnosis not present

## 2013-06-01 DIAGNOSIS — Z01818 Encounter for other preprocedural examination: Secondary | ICD-10-CM | POA: Diagnosis not present

## 2013-06-01 DIAGNOSIS — M549 Dorsalgia, unspecified: Secondary | ICD-10-CM | POA: Diagnosis not present

## 2013-06-01 DIAGNOSIS — Q762 Congenital spondylolisthesis: Secondary | ICD-10-CM | POA: Diagnosis not present

## 2013-06-01 DIAGNOSIS — F411 Generalized anxiety disorder: Secondary | ICD-10-CM | POA: Diagnosis present

## 2013-06-01 DIAGNOSIS — M4802 Spinal stenosis, cervical region: Secondary | ICD-10-CM | POA: Diagnosis not present

## 2013-06-01 DIAGNOSIS — Z01812 Encounter for preprocedural laboratory examination: Secondary | ICD-10-CM | POA: Diagnosis not present

## 2013-06-01 DIAGNOSIS — G4730 Sleep apnea, unspecified: Secondary | ICD-10-CM | POA: Diagnosis present

## 2013-06-01 DIAGNOSIS — Z8551 Personal history of malignant neoplasm of bladder: Secondary | ICD-10-CM | POA: Diagnosis not present

## 2013-06-01 DIAGNOSIS — R350 Frequency of micturition: Secondary | ICD-10-CM | POA: Diagnosis not present

## 2013-06-01 DIAGNOSIS — I10 Essential (primary) hypertension: Secondary | ICD-10-CM | POA: Diagnosis not present

## 2013-06-01 DIAGNOSIS — F329 Major depressive disorder, single episode, unspecified: Secondary | ICD-10-CM | POA: Diagnosis present

## 2013-06-01 HISTORY — DX: Unspecified osteoarthritis, unspecified site: M19.90

## 2013-06-01 HISTORY — DX: Pneumonia, unspecified organism: J18.9

## 2013-06-01 LAB — CBC
HCT: 41.1 % (ref 39.0–52.0)
Hemoglobin: 13.8 g/dL (ref 13.0–17.0)
MCH: 30.1 pg (ref 26.0–34.0)
MCV: 89.7 fL (ref 78.0–100.0)
RBC: 4.58 MIL/uL (ref 4.22–5.81)
WBC: 10.3 10*3/uL (ref 4.0–10.5)

## 2013-06-01 LAB — COMPREHENSIVE METABOLIC PANEL
ALT: 21 U/L (ref 0–53)
AST: 16 U/L (ref 0–37)
CO2: 28 mEq/L (ref 19–32)
Calcium: 9.5 mg/dL (ref 8.4–10.5)
Chloride: 102 mEq/L (ref 96–112)
GFR calc non Af Amer: 58 mL/min — ABNORMAL LOW (ref >90)
Potassium: 3.8 mEq/L (ref 3.5–5.1)
Sodium: 141 mEq/L (ref 135–145)
Total Bilirubin: 0.2 mg/dL — ABNORMAL LOW (ref 0.3–1.2)

## 2013-06-01 LAB — URINALYSIS, ROUTINE W REFLEX MICROSCOPIC
Glucose, UA: NEGATIVE mg/dL
Ketones, ur: NEGATIVE mg/dL
pH: 5.5 (ref 5.0–8.0)

## 2013-06-01 LAB — URINE MICROSCOPIC-ADD ON

## 2013-06-01 NOTE — Progress Notes (Addendum)
Pt denies SOB, chest pain, and sees Dr.Mcdowell of [place] as his cardiologist ; pt states that a stress test was requested by MD and done at [facility]  01/2012. Revonda Standard assessed  pt since pt had a history of PNA in May 2014 and reviewed pt EKG. Revonda Standard to review pt history and lab results. Revonda Standard advised to do a repeat chest x ray. Pt advised to bring CPAP mask on DOS.

## 2013-06-02 NOTE — Progress Notes (Signed)
Anesthesia PAT Evaluation:  Patient is a 67 year old male scheduled for C3-4 ACDF on 06/05/13 by Dr. Ophelia Charter.  I saw him during his PAT visit yesterday.  History includes community acquired LLL PNA 04/28/13, bilateral cataract extraction, obesity, former smoker, post-operative N/V, depression, anxiety, HTN, HLD, bladder cancer s/p surgery, OSA on CPAP, arthritis, nephrolithiasis.  PCP is Dr. Dwana Melena. He was evaluated by cardiologist Dr. Nona Dell in early 2013 for further evaluation of occasional chest tightness and rapid heart reate and fatigue and had a reassuring stress and echo.    Patient feeling much better after treatment for PNA last month.  He denied SOB, fever, persistent cough, chest pain.  He is not using inhalers.  He was able to weed whack yesterday.  Exam shows lungs clear, heart RRR.  No carotid bruits or significant edema noted.  CXR on 06/01/13 showed no active disease.  EKG on 04/28/13 showed ST @ 107, right BBB.  HR at PAT was 99 bpm.   He had a nuclear stress test on 02/18/12 that showed: Low risk Lexiscan Myoview. There were no diagnostic ST-segment changes or arrhythmias. Perfusion imaging shows soft tissue attenuation, with no clear evidence of scar or ischemia. LVEF is normal 68%.  Echo on 02/18/12 showed: - Left ventricle: The cavity size was normal. Wall thickness was increased in a pattern of mild LVH. Systolic function was normal. The estimated ejection fraction was in the range of 60% to 65%. Wall motion was normal; there were no regional wall motion abnormalities. Doppler parameters are consistent with abnormal left ventricular relaxation (grade 1 diastolic dysfunction). - Aortic valve: Trileaflet; mildly thickened leaflets. Trivial regurgitation. - Mitral valve: Calcified annulus. Trivial regurgitation. - Left atrium: The atrium was mildly dilated. - Right ventricle: The cavity size was mildly dilated. Systolic function was normal. - Tricuspid valve: Trivial  regurgitation. - Pericardium, extracardiac: There was no pericardial effusion.  Preoperative labs noted.  He appears recovered from his PNA.  He had recent cardiac testing within the last 18 months and denies any CV symptoms.  Anticipate that he can proceed as planned.  Velna Ochs [facility]/Anesthesiology Phone [phone] 06/02/2013 10:30 AM

## 2013-06-04 MED ORDER — CEFAZOLIN SODIUM-DEXTROSE 2-3 GM-% IV SOLR
2.0000 g | INTRAVENOUS | Status: AC
  Administered 2013-06-05: 2 g via INTRAVENOUS
  Filled 2013-06-04: qty 50

## 2013-06-04 NOTE — H&P (Signed)
[facility]   A Division of [employer]   [address] [place], [place] [address] Telephone: [phone]  Fax: [phone]     PATIENT: Zachary [name], Zachary [name]   MR#: [id number]  DOB: [birth year]   Visit Date: 05/30/2013    Chief complaint: cervical stenosis  C39-58 A 67 year old male referred to me by Dr. Magnus Ivan for cervical surgery.  He has been treated for ongoing pain for 3 to 6 months with progressive pain, increasing left arm weakness and left triceps weakness.  Pain radiates into his shoulder blade and he rates it as severe.  He has been on oxycodone and Robaxin without relief.  He is an active [employer] supporter and helps out with practice and with the team.  He has had difficulty with normal daily activities.  Other medications include gabapentin 100 mg daily, bethanechol 25 mg b.i.d. for bladder problems, fluconazole nasal spray, escitalopram 20 mg b.i.d., Lexapro 10 mg daily, and valsartan/hydrochlorothiazide 320/12.5 one p.o. daily for blood pressure.  In the past the patient has been on meloxicam, prednisone dose pack.  He has had epidurals and injections.  He has been on narcotic pain medication, several different types.  He got temporary relief from the epidural injection.   PRIMARY CARE PHYSICIAN:  Dr. Margo Aye.   ALLERGIES:  Sulfa drugs.   PAST MEDICAL HISTORY:  He had previous back surgery x2 in the past.  Had bladder cancer with 5 surgeries related to that.  He does have urinary frequency but voids normally.   FAMILY HISTORY:  He is married to his wife, Zachary [name], who is with him today.  He is retired.  He does not smoke or drink.   REVIEW OF SYSTEMS:  A 14-point review of systems is positive for depression, anxiety, bladder problems, bladder cancer, pneumonia, migraines, hypertension and sleep apnea.   PHYSICAL EXAMINATION:  Height 6 feet 1 inch.  Weight 230 pounds.  Alert and oriented.  WDWN.  NAD.  Good visual acuity with glasses.  No  supraclavicular lymphadenopathy.  Bilateral brachial plexus tenderness, much worse on the left than right.  Positive Spurling's.  Positive Lhermitte's.  Spurling's is more positive on the left than right.  There is left triceps atrophy.  Left triceps weakness.  Asymmetrical lower extremity reflexes with trace ankle and knee jerk on the left and 2+ on the right.  He has exquisite tenderness distal to the lateral epicondyle over the radial neck but has full supination and pronation.  No evidence of elbow effusion.  No tenderness at the distal RU joint.  Biceps, wrist flexion, supination and pronation are normal.  He has some weakness with wrist flexion and slight decreased grip on the left versus right.   IMAGING:  MRI scan is reviewed.  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  There is moderate narrowing at C4-5 rated mild to moderate without spondylolisthesis without significant disc herniation.  He has some changes at other levels.  Of note is the left foraminal narrowing at C6-7 on the left, which corresponds with his triceps weakness.    He has had a previous MRI.  There has been no progression of the foraminal narrowing at C6-7.  He does have significant progression of the stenosis and retrolisthesis at the C3-4 level.  This retrolisthesis is 2 mm.  Stenosis is rated severe at C3-4.  I discussed with him options.  I would recommend single-level  fusion and he understands this would not correct some of the foraminal narrowing on the left at C6-7 but this could be addressed later if it is a problem.  His narrowing at the C4-5 level is moderate at 8 mm.   PLAN:  Single-level fusion for correction of the retrolisthesis.  I discussed with him that I think we could leave the C4-5 level alone, which is rated at mild to moderate.  The procedure is discussed.  Risks of surgery discussed including re-operations,  pseudoarthrosis, posterior fusion, recurrent laryngeal nerve problems, dysphagia, and dysphonia.  Collar immobilization for 6 weeks.   For additional information please see handwritten notes, reports, orders and prescriptions in this chart.      Tawnya Pujol C. Ophelia Charter, M.D.    Auto-Authenticated by Veverly Fells. Ophelia Charter, M.D.  MCY/lb DD: 05/30/2013  DT: 05/31/2013

## 2013-06-05 ENCOUNTER — Encounter (HOSPITAL_COMMUNITY): Payer: Self-pay | Admitting: Vascular Surgery

## 2013-06-05 ENCOUNTER — Inpatient Hospital Stay (HOSPITAL_COMMUNITY)
Admission: RE | Admit: 2013-06-05 | Discharge: 2013-06-06 | DRG: 473 | Disposition: A | Discharge status: Home / Self Care | Payer: Medicare Other | Source: Ambulatory Visit | Attending: Orthopaedic Surgery | Admitting: Orthopaedic Surgery

## 2013-06-05 ENCOUNTER — Encounter (HOSPITAL_COMMUNITY): Payer: Self-pay | Admitting: *Deleted

## 2013-06-05 ENCOUNTER — Inpatient Hospital Stay (HOSPITAL_COMMUNITY): Payer: Medicare Other

## 2013-06-05 ENCOUNTER — Inpatient Hospital Stay (HOSPITAL_COMMUNITY): Payer: Medicare Other | Admitting: Anesthesiology

## 2013-06-05 ENCOUNTER — Encounter (HOSPITAL_COMMUNITY)
Admission: RE | Disposition: A | Discharge status: Home / Self Care | Payer: Self-pay | Source: Ambulatory Visit | Attending: Orthopaedic Surgery

## 2013-06-05 DIAGNOSIS — Z882 Allergy status to sulfonamides status: Secondary | ICD-10-CM

## 2013-06-05 DIAGNOSIS — M549 Dorsalgia, unspecified: Secondary | ICD-10-CM | POA: Diagnosis not present

## 2013-06-05 DIAGNOSIS — I10 Essential (primary) hypertension: Secondary | ICD-10-CM | POA: Diagnosis not present

## 2013-06-05 DIAGNOSIS — M5020 Other cervical disc displacement, unspecified cervical region: Principal | ICD-10-CM | POA: Diagnosis present

## 2013-06-05 DIAGNOSIS — F411 Generalized anxiety disorder: Secondary | ICD-10-CM | POA: Diagnosis present

## 2013-06-05 DIAGNOSIS — R350 Frequency of micturition: Secondary | ICD-10-CM | POA: Diagnosis not present

## 2013-06-05 DIAGNOSIS — F3289 Other specified depressive episodes: Secondary | ICD-10-CM | POA: Diagnosis present

## 2013-06-05 DIAGNOSIS — Z01812 Encounter for preprocedural laboratory examination: Secondary | ICD-10-CM

## 2013-06-05 DIAGNOSIS — G4730 Sleep apnea, unspecified: Secondary | ICD-10-CM | POA: Diagnosis present

## 2013-06-05 DIAGNOSIS — M4802 Spinal stenosis, cervical region: Secondary | ICD-10-CM | POA: Diagnosis not present

## 2013-06-05 DIAGNOSIS — F329 Major depressive disorder, single episode, unspecified: Secondary | ICD-10-CM | POA: Diagnosis present

## 2013-06-05 DIAGNOSIS — Z8551 Personal history of malignant neoplasm of bladder: Secondary | ICD-10-CM

## 2013-06-05 DIAGNOSIS — Q762 Congenital spondylolisthesis: Secondary | ICD-10-CM | POA: Diagnosis not present

## 2013-06-05 DIAGNOSIS — M5382 Other specified dorsopathies, cervical region: Secondary | ICD-10-CM | POA: Diagnosis not present

## 2013-06-05 HISTORY — DX: Disease of blood and blood-forming organs, unspecified: D75.9

## 2013-06-05 HISTORY — PX: CERVICAL FUSION: SHX112

## 2013-06-05 HISTORY — PX: ANTERIOR CERVICAL DECOMP/DISCECTOMY FUSION: SHX1161

## 2013-06-05 SURGERY — ANTERIOR CERVICAL DECOMPRESSION/DISCECTOMY FUSION 1 LEVEL
Anesthesia: General | Site: Neck | Wound class: Clean

## 2013-06-05 MED ORDER — OXYCODONE HCL 5 MG PO TABS: 5.0000 mg | ORAL_TABLET | Freq: Once | ORAL | Status: DC | PRN

## 2013-06-05 MED ORDER — OXYCODONE-ACETAMINOPHEN 5-325 MG PO TABS
ORAL_TABLET | ORAL | Status: AC
  Filled 2013-06-05: qty 2

## 2013-06-05 MED ORDER — CEFAZOLIN SODIUM 1-5 GM-% IV SOLN
1.0000 g | Freq: Three times a day (TID) | INTRAVENOUS | Status: AC
  Administered 2013-06-05 – 2013-06-06 (×2): 1 g via INTRAVENOUS
  Filled 2013-06-05 (×2): qty 50

## 2013-06-05 MED ORDER — KETOROLAC TROMETHAMINE 30 MG/ML IJ SOLN
INTRAMUSCULAR | Status: AC
  Administered 2013-06-05: 30 mg
  Filled 2013-06-05: qty 1

## 2013-06-05 MED ORDER — ARTIFICIAL TEARS OP OINT
TOPICAL_OINTMENT | OPHTHALMIC | Status: DC | PRN
  Administered 2013-06-05: 1 via OPHTHALMIC

## 2013-06-05 MED ORDER — IRBESARTAN 300 MG PO TABS
300.0000 mg | ORAL_TABLET | Freq: Every day | ORAL | Status: DC
  Administered 2013-06-05 – 2013-06-06 (×2): 300 mg via ORAL
  Filled 2013-06-05 (×3): qty 1

## 2013-06-05 MED ORDER — FENTANYL CITRATE 0.05 MG/ML IJ SOLN: 50.0000 ug | Freq: Once | INTRAMUSCULAR | Status: DC

## 2013-06-05 MED ORDER — METHOCARBAMOL 100 MG/ML IJ SOLN
500.0000 mg | Freq: Four times a day (QID) | INTRAVENOUS | Status: DC | PRN
  Filled 2013-06-05: qty 5

## 2013-06-05 MED ORDER — BISACODYL 10 MG RE SUPP
10.0000 mg | Freq: Every day | RECTAL | Status: DC | PRN
  Filled 2013-06-05: qty 1

## 2013-06-05 MED ORDER — SODIUM CHLORIDE 0.9 % IJ SOLN: 3.0000 mL | INTRAMUSCULAR | Status: DC | PRN

## 2013-06-05 MED ORDER — ZOLPIDEM TARTRATE 5 MG PO TABS: 5.0000 mg | ORAL_TABLET | Freq: Every evening | ORAL | Status: DC | PRN

## 2013-06-05 MED ORDER — HYDROCHLOROTHIAZIDE 12.5 MG PO CAPS
12.5000 mg | ORAL_CAPSULE | Freq: Every day | ORAL | Status: DC
  Administered 2013-06-05 – 2013-06-06 (×2): 12.5 mg via ORAL
  Filled 2013-06-05 (×2): qty 1

## 2013-06-05 MED ORDER — GLYCOPYRROLATE 0.2 MG/ML IJ SOLN
INTRAMUSCULAR | Status: DC | PRN
  Administered 2013-06-05: 0.6 mg via INTRAVENOUS

## 2013-06-05 MED ORDER — NEOSTIGMINE METHYLSULFATE 1 MG/ML IJ SOLN
INTRAMUSCULAR | Status: DC | PRN
  Administered 2013-06-05: 3 mg via INTRAVENOUS

## 2013-06-05 MED ORDER — LIDOCAINE HCL 4 % MT SOLN
OROMUCOSAL | Status: DC | PRN
  Administered 2013-06-05: 4 mL via TOPICAL

## 2013-06-05 MED ORDER — HYDROCODONE-ACETAMINOPHEN 5-325 MG PO TABS: 1.0000 | ORAL_TABLET | ORAL | Status: DC | PRN

## 2013-06-05 MED ORDER — METHOCARBAMOL 500 MG PO TABS
500.0000 mg | ORAL_TABLET | Freq: Four times a day (QID) | ORAL | Status: DC | PRN
  Administered 2013-06-05: 500 mg via ORAL

## 2013-06-05 MED ORDER — OXYCODONE-ACETAMINOPHEN 5-325 MG PO TABS: 1.0000 | ORAL_TABLET | ORAL | Status: DC | PRN

## 2013-06-05 MED ORDER — SODIUM CHLORIDE 0.9 % IJ SOLN: 3.0000 mL | Freq: Two times a day (BID) | INTRAMUSCULAR | Status: DC

## 2013-06-05 MED ORDER — 0.9 % SODIUM CHLORIDE (POUR BTL) OPTIME
TOPICAL | Status: DC | PRN
  Administered 2013-06-05: 1000 mL

## 2013-06-05 MED ORDER — HYDROMORPHONE HCL PF 1 MG/ML IJ SOLN
INTRAMUSCULAR | Status: AC
  Filled 2013-06-05: qty 1

## 2013-06-05 MED ORDER — FLUTICASONE PROPIONATE 50 MCG/ACT NA SUSP
1.0000 | Freq: Every day | NASAL | Status: DC | PRN
  Filled 2013-06-05: qty 16

## 2013-06-05 MED ORDER — EPHEDRINE SULFATE 50 MG/ML IJ SOLN
INTRAMUSCULAR | Status: DC | PRN
  Administered 2013-06-05: 10 mg via INTRAVENOUS

## 2013-06-05 MED ORDER — BETHANECHOL CHLORIDE 25 MG PO TABS
50.0000 mg | ORAL_TABLET | Freq: Every day | ORAL | Status: DC
  Administered 2013-06-05 – 2013-06-06 (×2): 50 mg via ORAL
  Filled 2013-06-05 (×2): qty 2

## 2013-06-05 MED ORDER — LACTATED RINGERS IV SOLN
INTRAVENOUS | Status: DC | PRN
  Administered 2013-06-05 (×3): via INTRAVENOUS

## 2013-06-05 MED ORDER — ESCITALOPRAM OXALATE 10 MG PO TABS
10.0000 mg | ORAL_TABLET | Freq: Every day | ORAL | Status: DC
Start: 2013-06-06 — End: 2013-06-06
  Administered 2013-06-06: 10 mg via ORAL
  Filled 2013-06-05: qty 1

## 2013-06-05 MED ORDER — ACETAMINOPHEN 650 MG RE SUPP: 650.0000 mg | RECTAL | Status: DC | PRN

## 2013-06-05 MED ORDER — BUPIVACAINE-EPINEPHRINE 0.5% -1:200000 IJ SOLN
INTRAMUSCULAR | Status: DC | PRN
  Administered 2013-06-05: 6 mL

## 2013-06-05 MED ORDER — KETOROLAC TROMETHAMINE 0.4 % OP SOLN
1.0000 [drp] | Freq: Every day | OPHTHALMIC | Status: DC
  Filled 2013-06-05 (×9): qty 0.1

## 2013-06-05 MED ORDER — SODIUM CHLORIDE 0.9 % IV SOLN: 250.0000 mL | INTRAVENOUS | Status: DC

## 2013-06-05 MED ORDER — SENNOSIDES-DOCUSATE SODIUM 8.6-50 MG PO TABS: 1.0000 | ORAL_TABLET | Freq: Every evening | ORAL | Status: DC | PRN

## 2013-06-05 MED ORDER — MIDAZOLAM HCL 2 MG/2ML IJ SOLN: 1.0000 mg | INTRAMUSCULAR | Status: DC | PRN

## 2013-06-05 MED ORDER — PROPOFOL 10 MG/ML IV BOLUS
INTRAVENOUS | Status: DC | PRN
  Administered 2013-06-05: 200 mg via INTRAVENOUS

## 2013-06-05 MED ORDER — GUAIFENESIN 100 MG/5ML PO LIQD
100.0000 mg | ORAL | Status: DC | PRN
  Filled 2013-06-05: qty 10

## 2013-06-05 MED ORDER — PHENYLEPHRINE HCL 10 MG/ML IJ SOLN
INTRAMUSCULAR | Status: DC | PRN
  Administered 2013-06-05 (×7): 80 ug via INTRAVENOUS

## 2013-06-05 MED ORDER — LACTATED RINGERS IV SOLN
INTRAVENOUS | Status: DC
  Administered 2013-06-05: 14:00:00 via INTRAVENOUS

## 2013-06-05 MED ORDER — MORPHINE SULFATE 2 MG/ML IJ SOLN
1.0000 mg | INTRAMUSCULAR | Status: DC | PRN
  Administered 2013-06-06: 2 mg via INTRAVENOUS
  Filled 2013-06-05: qty 1

## 2013-06-05 MED ORDER — LIDOCAINE HCL (CARDIAC) 20 MG/ML IV SOLN
INTRAVENOUS | Status: DC | PRN
  Administered 2013-06-05: 100 mg via INTRAVENOUS

## 2013-06-05 MED ORDER — MIDAZOLAM HCL 5 MG/5ML IJ SOLN
INTRAMUSCULAR | Status: DC | PRN
  Administered 2013-06-05: 2 mg via INTRAVENOUS

## 2013-06-05 MED ORDER — THROMBIN 20000 UNITS EX SOLR
CUTANEOUS | Status: DC | PRN
  Administered 2013-06-05: 16:00:00 via TOPICAL

## 2013-06-05 MED ORDER — VECURONIUM BROMIDE 10 MG IV SOLR
INTRAVENOUS | Status: DC | PRN
  Administered 2013-06-05 (×2): 2 mg via INTRAVENOUS
  Administered 2013-06-05: 4 mg via INTRAVENOUS

## 2013-06-05 MED ORDER — FENTANYL CITRATE 0.05 MG/ML IJ SOLN
INTRAMUSCULAR | Status: DC | PRN
  Administered 2013-06-05: 50 ug via INTRAVENOUS
  Administered 2013-06-05: 100 ug via INTRAVENOUS
  Administered 2013-06-05 (×2): 50 ug via INTRAVENOUS

## 2013-06-05 MED ORDER — LOTEPREDNOL ETABONATE 0.5 % OP SUSP
1.0000 [drp] | Freq: Every day | OPHTHALMIC | Status: DC
  Filled 2013-06-05: qty 5

## 2013-06-05 MED ORDER — MENTHOL 3 MG MT LOZG: 1.0000 | LOZENGE | OROMUCOSAL | Status: DC | PRN

## 2013-06-05 MED ORDER — PROMETHAZINE HCL 25 MG/ML IJ SOLN: 6.2500 mg | INTRAMUSCULAR | Status: DC | PRN

## 2013-06-05 MED ORDER — OXYCODONE HCL 5 MG/5ML PO SOLN: 5.0000 mg | Freq: Once | ORAL | Status: DC | PRN

## 2013-06-05 MED ORDER — VALSARTAN-HYDROCHLOROTHIAZIDE 320-12.5 MG PO TABS: 1.0000 | ORAL_TABLET | Freq: Every day | ORAL | Status: DC

## 2013-06-05 MED ORDER — DOCUSATE SODIUM 100 MG PO CAPS
100.0000 mg | ORAL_CAPSULE | Freq: Two times a day (BID) | ORAL | Status: DC
  Administered 2013-06-05 – 2013-06-06 (×2): 100 mg via ORAL
  Filled 2013-06-05 (×2): qty 1

## 2013-06-05 MED ORDER — DIAZEPAM 5 MG PO TABS: 5.0000 mg | ORAL_TABLET | Freq: Two times a day (BID) | ORAL | Status: DC | PRN

## 2013-06-05 MED ORDER — FLEET ENEMA 7-19 GM/118ML RE ENEM: 1.0000 | ENEMA | Freq: Once | RECTAL | Status: AC | PRN

## 2013-06-05 MED ORDER — KETOROLAC TROMETHAMINE 30 MG/ML IJ SOLN
30.0000 mg | Freq: Once | INTRAMUSCULAR | Status: AC
  Administered 2013-06-05: 30 mg via INTRAVENOUS

## 2013-06-05 MED ORDER — METHOCARBAMOL 500 MG PO TABS: 500.0000 mg | ORAL_TABLET | Freq: Four times a day (QID) | ORAL | Status: DC | PRN

## 2013-06-05 MED ORDER — BUPIVACAINE-EPINEPHRINE PF 0.5-1:200000 % IJ SOLN
INTRAMUSCULAR | Status: AC
  Filled 2013-06-05: qty 30

## 2013-06-05 MED ORDER — KCL IN DEXTROSE-NACL 20-5-0.45 MEQ/L-%-% IV SOLN
INTRAVENOUS | Status: DC
  Filled 2013-06-05 (×3): qty 1000

## 2013-06-05 MED ORDER — HYDROMORPHONE HCL PF 1 MG/ML IJ SOLN
0.2500 mg | INTRAMUSCULAR | Status: DC | PRN
  Administered 2013-06-05 (×4): 0.5 mg via INTRAVENOUS

## 2013-06-05 MED ORDER — PANTOPRAZOLE SODIUM 40 MG IV SOLR
40.0000 mg | Freq: Every day | INTRAVENOUS | Status: DC
  Administered 2013-06-05: 40 mg via INTRAVENOUS
  Filled 2013-06-05 (×2): qty 40

## 2013-06-05 MED ORDER — SUCCINYLCHOLINE CHLORIDE 20 MG/ML IJ SOLN
INTRAMUSCULAR | Status: DC | PRN
  Administered 2013-06-05: 140 mg via INTRAVENOUS

## 2013-06-05 MED ORDER — ONDANSETRON HCL 4 MG/2ML IJ SOLN
INTRAMUSCULAR | Status: DC | PRN
  Administered 2013-06-05: 4 mg via INTRAVENOUS

## 2013-06-05 MED ORDER — OXYCODONE-ACETAMINOPHEN 5-325 MG PO TABS
1.0000 | ORAL_TABLET | ORAL | Status: DC | PRN
  Administered 2013-06-05: 1 via ORAL
  Administered 2013-06-05 – 2013-06-06 (×2): 2 via ORAL
  Administered 2013-06-06: 1 via ORAL
  Filled 2013-06-05 (×2): qty 2
  Filled 2013-06-05 (×2): qty 1

## 2013-06-05 MED ORDER — ONDANSETRON HCL 4 MG/2ML IJ SOLN: 4.0000 mg | INTRAMUSCULAR | Status: DC | PRN

## 2013-06-05 MED ORDER — ACETAMINOPHEN 325 MG PO TABS: 650.0000 mg | ORAL_TABLET | ORAL | Status: DC | PRN

## 2013-06-05 MED ORDER — THROMBIN 20000 UNITS EX SOLR
CUTANEOUS | Status: AC
  Filled 2013-06-05: qty 20000

## 2013-06-05 MED ORDER — PHENOL 1.4 % MT LIQD: 1.0000 | OROMUCOSAL | Status: DC | PRN

## 2013-06-05 SURGICAL SUPPLY — 58 items
BENZOIN TINCTURE PRP APPL 2/3 (GAUZE/BANDAGES/DRESSINGS) ×2 IMPLANT
BIT DRILL SKYLINE 12MM (BIT) ×1 IMPLANT
BLADE SURG ROTATE 9660 (MISCELLANEOUS) IMPLANT
BONE CERV LORDOTIC 14.5X12X6 (Bone Implant) ×2 IMPLANT
BUR ROUND FLUTED 4 SOFT TCH (BURR) ×2 IMPLANT
CANISTER SUCTION 2500CC (MISCELLANEOUS) ×2 IMPLANT
CLOTH BEACON ORANGE TIMEOUT ST (SAFETY) ×2 IMPLANT
CLSR STERI-STRIP ANTIMIC 1/2X4 (GAUZE/BANDAGES/DRESSINGS) ×2 IMPLANT
COLLAR CERV LO CONTOUR FIRM DE (SOFTGOODS) ×2 IMPLANT
CORDS BIPOLAR (ELECTRODE) ×2 IMPLANT
COVER MAYO STAND STRL (DRAPES) ×4 IMPLANT
COVER SURGICAL LIGHT HANDLE (MISCELLANEOUS) ×2 IMPLANT
DERMABOND ADVANCED (GAUZE/BANDAGES/DRESSINGS)
DERMABOND ADVANCED .7 DNX12 (GAUZE/BANDAGES/DRESSINGS) IMPLANT
DRAPE C-ARM 42X72 X-RAY (DRAPES) ×2 IMPLANT
DRAPE MICROSCOPE LEICA (MISCELLANEOUS) ×2 IMPLANT
DRAPE PROXIMA HALF (DRAPES) ×2 IMPLANT
DRILL BIT SKYLINE 12MM (BIT) ×1
DRSG MEPILEX BORDER 4X4 (GAUZE/BANDAGES/DRESSINGS) ×2 IMPLANT
DRSG MEPILEX BORDER 4X8 (GAUZE/BANDAGES/DRESSINGS) IMPLANT
DURAPREP 6ML APPLICATOR 50/CS (WOUND CARE) ×2 IMPLANT
ELECT COATED BLADE 2.86 ST (ELECTRODE) ×2 IMPLANT
ELECT REM PT RETURN 9FT ADLT (ELECTROSURGICAL) ×2
ELECTRODE REM PT RTRN 9FT ADLT (ELECTROSURGICAL) ×1 IMPLANT
EVACUATOR 1/8 PVC DRAIN (DRAIN) ×2 IMPLANT
GAUZE XEROFORM 1X8 LF (GAUZE/BANDAGES/DRESSINGS) ×2 IMPLANT
GLOVE BIOGEL PI IND STRL 7.5 (GLOVE) ×1 IMPLANT
GLOVE BIOGEL PI IND STRL 8 (GLOVE) ×1 IMPLANT
GLOVE BIOGEL PI INDICATOR 7.5 (GLOVE) ×1
GLOVE BIOGEL PI INDICATOR 8 (GLOVE) ×1
GLOVE ECLIPSE 7.0 STRL STRAW (GLOVE) ×2 IMPLANT
GLOVE ORTHO TXT STRL SZ7.5 (GLOVE) ×2 IMPLANT
GOWN PREVENTION PLUS LG XLONG (DISPOSABLE) IMPLANT
GOWN PREVENTION PLUS XLARGE (GOWN DISPOSABLE) ×2 IMPLANT
GOWN STRL NON-REIN LRG LVL3 (GOWN DISPOSABLE) ×4 IMPLANT
HEAD HALTER (SOFTGOODS) ×2 IMPLANT
HEMOSTAT SURGICEL 2X14 (HEMOSTASIS) IMPLANT
KIT BASIN OR (CUSTOM PROCEDURE TRAY) ×2 IMPLANT
KIT ROOM TURNOVER OR (KITS) ×2 IMPLANT
MANIFOLD NEPTUNE II (INSTRUMENTS) IMPLANT
NEEDLE 25GX 5/8IN NON SAFETY (NEEDLE) ×2 IMPLANT
NS IRRIG 1000ML POUR BTL (IV SOLUTION) ×2 IMPLANT
PACK ORTHO CERVICAL (CUSTOM PROCEDURE TRAY) ×2 IMPLANT
PAD ARMBOARD 7.5X6 YLW CONV (MISCELLANEOUS) ×4 IMPLANT
PATTIES SURGICAL .5 X.5 (GAUZE/BANDAGES/DRESSINGS) IMPLANT
PIN TEMP SKYLINE THREADED (PIN) ×2 IMPLANT
PLATE SKYLINE 12MM (Plate) ×2 IMPLANT
SCREW VAR SELF TAP SKYLINE 14M (Screw) ×8 IMPLANT
SPONGE GAUZE 4X4 12PLY (GAUZE/BANDAGES/DRESSINGS) ×2 IMPLANT
SPONGE SURGIFOAM ABS GEL SZ50 (HEMOSTASIS) ×2 IMPLANT
SURGIFLO TRUKIT (HEMOSTASIS) IMPLANT
SUT VIC AB 3-0 X1 27 (SUTURE) ×2 IMPLANT
SUT VICRYL 4-0 PS2 18IN ABS (SUTURE) ×4 IMPLANT
SYR 30ML SLIP (SYRINGE) ×2 IMPLANT
SYR BULB 3OZ (MISCELLANEOUS) ×2 IMPLANT
TOWEL OR 17X24 6PK STRL BLUE (TOWEL DISPOSABLE) ×2 IMPLANT
TOWEL OR 17X26 10 PK STRL BLUE (TOWEL DISPOSABLE) ×2 IMPLANT
WATER STERILE IRR 1000ML POUR (IV SOLUTION) IMPLANT

## 2013-06-05 NOTE — Anesthesia Procedure Notes (Signed)
Procedure Name: Intubation Date/Time: 06/05/2013 3:08 PM Performed by: Luster Landsberg Pre-anesthesia Checklist: Patient identified, Emergency Drugs available, Suction available and Patient being monitored Patient Re-evaluated:Patient Re-evaluated prior to inductionOxygen Delivery Method: Circle system utilized Preoxygenation: Pre-oxygenation with 100% oxygen Intubation Type: IV induction Ventilation: Mask ventilation without difficulty and Oral airway inserted - appropriate to patient size Grade View: Grade I Tube type: Oral Tube size: 7.5 mm Number of attempts: 1 Airway Equipment and Method: Stylet and LTA kit utilized Placement Confirmation: ETT inserted through vocal cords under direct vision,  positive ETCO2 and breath sounds checked- equal and bilateral Secured at: 22 cm Tube secured with: Tape Dental Injury: Teeth and Oropharynx as per pre-operative assessment

## 2013-06-05 NOTE — Brief Op Note (Cosign Needed)
06/05/2013  4:45 PM  PATIENT:  Zachary [name]  67 y.o. 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  PROCEDURE:  Procedure(s) with comments:  C3-4 Anterior Cervical Discectomy and Fusion, Allograft, Plate (N/A) - Z6-1 Anterior Cervical Discectomy and Fusion, Allograft, Plate  SURGEON:  Surgeon(s) and Role:    * Eldred Manges, MD - Primary  PHYSICIAN ASSISTANT: Maud Deed [facility]  ASSISTANTS: none   ANESTHESIA:   general  EBL:  Total I/O In: 1000 [I.V.:1000] Out: -   BLOOD ADMINISTERED:none  DRAINS: (1) Hemovact drain(s) in the anterior neck with  Suction Open   LOCAL MEDICATIONS USED:  NONE  SPECIMEN:  No Specimen  DISPOSITION OF SPECIMEN:  N/A  COUNTS:  YES  TOURNIQUET:  * No tourniquets in log *  DICTATION: .Note written in EPIC  PLAN OF CARE: Admit to inpatient   PATIENT DISPOSITION:  PACU - hemodynamically stable.   Delay start of Pharmacological VTE agent (>24hrs) due to surgical blood loss or risk of bleeding: yes

## 2013-06-05 NOTE — Anesthesia Postprocedure Evaluation (Signed)
  Anesthesia Post-op Note  Patient: Zachary [name]  Procedure(s) Performed: Procedure(s) with comments:  C3-4 Anterior Cervical Discectomy and Fusion, Allograft, Plate (N/A) - Z6-1 Anterior Cervical Discectomy and Fusion, Allograft, Plate  Patient Location: PACU  Anesthesia Type:General  Level of Consciousness: awake and alert   Airway and Oxygen Therapy: Patient Spontanous Breathing  Post-op Pain: mild  Post-op Assessment: Post-op Vital signs reviewed, Patient's Cardiovascular Status Stable, Respiratory Function Stable, Patent Airway, No signs of Nausea or vomiting and Pain level controlled  Post-op Vital Signs: stable  Complications: No apparent anesthesia complications

## 2013-06-05 NOTE — Anesthesia Preprocedure Evaluation (Addendum)
Anesthesia Evaluation  Patient identified by MRN, date of birth, ID band Patient awake    Reviewed: Allergy & Precautions, H&P , NPO status , Patient's Chart, lab work & pertinent test results  History of Anesthesia Complications (+) PONV  Airway Mallampati: III TM Distance: <3 FB Neck ROM: Limited    Dental   Pulmonary shortness of breath, sleep apnea ,  breath sounds clear to auscultation        Cardiovascular hypertension, Rhythm:Regular Rate:Normal     Neuro/Psych  Headaches, Anxiety Depression  Neuromuscular disease    GI/Hepatic   Endo/Other    Renal/GU Bladder ca hx     Musculoskeletal   Abdominal (+) + obese,   Peds  Hematology   Anesthesia Other Findings   Reproductive/Obstetrics                          Anesthesia Physical Anesthesia Plan  ASA: III  Anesthesia Plan: General   Post-op Pain Management:    Induction: Intravenous  Airway Management Planned: Oral ETT and Video Laryngoscope Planned  Additional Equipment:   Intra-op Plan:   Post-operative Plan: Extubation in OR  Informed Consent: I have reviewed the patients History and Physical, chart, labs and discussed the procedure including the risks, benefits and alternatives for the proposed anesthesia with the patient or authorized representative who has indicated his/her understanding and acceptance.     Plan Discussed with: CRNA and Surgeon  Anesthesia Plan Comments:         Anesthesia Quick Evaluation

## 2013-06-05 NOTE — Interval H&P Note (Signed)
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 After consideration of risks, benefits and other options for treatment, the patient has consented to  Procedure(s) with comments:  C3-4 Anterior Cervical Discectomy and Fusion, Allograft, Plate (N/A) - Z6-1 Anterior Cervical Discectomy and Fusion, Allograft, Plate as a surgical intervention .  The patient's history has been reviewed, patient examined, no change in status, stable for surgery.  I have reviewed the patient's chart and labs.  Questions were answered to the patient's satisfaction.     YATES,MARK C

## 2013-06-05 NOTE — Transfer of Care (Signed)
Immediate Anesthesia Transfer of Care Note  Patient: Zachary [name]  Procedure(s) Performed: Procedure(s) with comments:  C3-4 Anterior Cervical Discectomy and Fusion, Allograft, Plate (N/A) - W4-1 Anterior Cervical Discectomy and Fusion, Allograft, Plate  Patient Location: PACU  Anesthesia Type:General  Level of Consciousness: awake and alert   Airway & Oxygen Therapy: Patient Spontanous Breathing and Patient connected to nasal cannula oxygen  Post-op Assessment: Report given to PACU RN and Post -op Vital signs reviewed and stable  Post vital signs: Reviewed and stable  Complications: No apparent anesthesia complications

## 2013-06-06 ENCOUNTER — Encounter (HOSPITAL_COMMUNITY): Payer: Self-pay | Admitting: General Practice

## 2013-06-06 MED ORDER — TAMSULOSIN HCL 0.4 MG PO CAPS
0.4000 mg | ORAL_CAPSULE | Freq: Every day | ORAL | Status: DC
  Administered 2013-06-06: 0.4 mg via ORAL
  Filled 2013-06-06: qty 1

## 2013-06-06 NOTE — Progress Notes (Signed)
Pt unable to void, bladder scan showed 596 cc, I&O cath obtained 600 cc, pt able to void on own at 0300. Pt seems to have some possible BPH( could not put in adult cath, had to use peds).

## 2013-06-06 NOTE — Progress Notes (Signed)
Pt ambulated to bathroom with assist and voided 450cc urine without difficulty. Flomax given per MD order. Scripts and D/C instructions reviewed with pt and his wife

## 2013-06-06 NOTE — Progress Notes (Signed)
UR COMPLETED  

## 2013-06-06 NOTE — Op Note (Signed)
[name], [name] NO.:  [id number]  MEDICAL RECORD NO.:  [id number]  LOCATION:  5N04C                        FACILITY:  [facility]  PHYSICIAN:  Keaden Gunnoe C. Ophelia Charter, M.D.    DATE OF BIRTH:  10-03-1946  DATE OF PROCEDURE:  06/05/2013 DATE OF DISCHARGE:                              OPERATIVE REPORT   PREOPERATIVE DIAGNOSIS:  C3-C4 herniated nucleus pulposus with cervical stenosis.  POSTOPERATIVE DIAGNOSIS:  C3-C4 herniated nucleus pulposus with cervical stenosis.  PROCEDURE:  C3-C4 anterior cervical diskectomy and fusion, allograft and plate, DePuy cortical cancellous allograft, self locking 14 mm screws x4, and 12 mm plate.  BRIEF HISTORY:  This is a 68 year old male with progressive cervical stenosis with large disk protrusion occupying more than 50% of the cervical canal with some progressive upper and lower extremity symptoms with numbness and weakness.  The patient had some foraminal narrowing at C6-C7 and some symptoms consistent with this, but did not show any progression all over serial MRI scans, and at C3-C4 had retrolisthesis of 2 mm with stenosis that is severe at the C3-C4 level with combination of congenitally-acquired dysphasia with some left triceps weakness and atrophy and slight decreased grip on the left versus right with some lower extremity and mild hyperreflexia.  PROCEDURE:  After standard induction of general anesthesia, prepping the neck and after informed consent, area was covered with towels, head halter traction had been applied, and a sterile skin marker and Betadine, Steri-Drape had been applied based on palpable landmarks of C3 for exposure.  Time-out for the procedure completed.  Ancef was given prophylactically.  Incision was started at the midline extending to the left.  Platysma was divided in line with the skin incision.  Blunt dissection down the longus coli was performed.  Spur was noted at C4-C5, needle was placed.  Cross-table  lateral x-ray confirmed this was at C4- C5.  The needle was moved up one more level and in picture taken to confirm this was the appropriate level.  Diskectomy was performed with 15 blade and pituitaries to Zachary [name] the disk.  Self-retaining Cloward retractor blades were placed, sharp blades right and left, smooth blades above and below.  Eventually the below blade was switched for one with teeth to catch on the spur at C4-C5, which helped keep the self- retaining retractors in proper position.  Operative microscope was draped, brought in, and disk was taken down the posterior longitudinal ligament.  There was thick chunks of ligament extruded.  The patient had some overhanging spurs off of C3 that came back behind C4 and had to be carefully burred down with microdissection.  Behind the spur was numerous thick pieces of disk material.  Once they were removed, cord was directly visualized and it as allowed to round up and protrude up into the disk space.  This would normally would prior to being compressed by the large chunks of disk.  Uncovertebral joints were stripped on both sides.  There was complete visualization of the dura all the way across the 10 mm width space.  There was room for egress outside 6 mm trial spacer.  Cortical cancellous 6 mm graft was selected, traction was  pulled by the CRNA.  It was countersunk 1-2 mm, checked under x-ray after C-arm had been draped and then 12 mm plate was placed. The screws were angled into the vertebral body, and it was secured, confirmed on AP and lateral x-ray and good position and alignment after irrigation saline solution.  A Hemovac was placed with in-and-out technique in line with the skin. The platysma was closed with 3-0, 4-0 Vicryl in the subcuticular skin closure.  Tincture of benzoin, Steri-Strips, Marcaine 7 mL were infiltrated in the skin, and postop dressing.  Soft cervical collar was applied.  The patient tolerated the procedure  well and transferred to the recovery room in stable condition.     Odean Fester C. Ophelia Charter, M.D.     MCY/MEDQ  D:  06/05/2013  T:  06/06/2013  Job:  [id number]

## 2013-06-06 NOTE — Progress Notes (Signed)
Orthopedic Tech Progress Note Patient Details:  Zachary [name] [facility] Feb 17, 1946 [id number]  Ortho Devices Type of Ortho Device: Soft collar Ortho Device/Splint Interventions: Application   Cammer, Mickie Bail 06/06/2013, 8:13 AM

## 2013-06-06 NOTE — Progress Notes (Signed)
Subjective: 1 Day Post-Op Procedure(s) (LRB):  C3-4 Anterior Cervical Discectomy and Fusion, Allograft, Plate (N/A) Patient reports pain as mild.   Arm pain improved.  Difficulty voiding.  I and O cath last night.  Voiding small amounts this am.  Pt with ho prostate CA.  Will give dose of flomax today before discharge.   Objective: Vital signs in last 24 hours: Temp:  [97.1 F (36.2 C)-98.5 F (36.9 C)] 97.1 F (36.2 C) (06/10 0634) Pulse Rate:  [81-101] 85 (06/10 0634) Resp:  [12-33] 18 (06/10 0634) BP: (133-143)/(65-90) 140/70 mmHg (06/10 0634) SpO2:  [90 %-100 %] 96 % (06/10 0634)  Intake/Output from previous day: 06/09 0701 - 06/10 0700 In: 3200 [P.O.:600; I.V.:2600] Out: 875 [Urine:200; Drains:75] Intake/Output this shift:    No results found for this basename: HGB,  in the last 72 hours No results found for this basename: WBC, RBC, HCT, PLT,  in the last 72 hours No results found for this basename: NA, K, CL, CO2, BUN, CREATININE, GLUCOSE, CALCIUM,  in the last 72 hours No results found for this basename: LABPT, INR,  in the last 72 hours  Neurovascular intact Incision: dressing C/D/I and drain removed.  Assessment/Plan: 1 Day Post-Op Procedure(s) (LRB):  C3-4 Anterior Cervical Discectomy and Fusion, Allograft, Plate (N/A) Advance diet Discharge home today. flomax this am x1 Additional soft collar for showering at home. Ov one week rx percocet and robaxin  Zachary [name] M 06/06/2013, 8:10 AM

## 2013-06-08 ENCOUNTER — Telehealth (HOSPITAL_COMMUNITY): Payer: Self-pay | Admitting: Psychiatry

## 2013-06-09 DIAGNOSIS — M5020 Other cervical disc displacement, unspecified cervical region: Secondary | ICD-10-CM | POA: Diagnosis not present

## 2013-06-09 DIAGNOSIS — M4800 Spinal stenosis, site unspecified: Secondary | ICD-10-CM | POA: Diagnosis not present

## 2013-06-09 DIAGNOSIS — M5412 Radiculopathy, cervical region: Secondary | ICD-10-CM | POA: Diagnosis not present

## 2013-06-09 NOTE — Telephone Encounter (Signed)
Sent fax for this on 06/08/2013.

## 2013-06-16 DIAGNOSIS — R21 Rash and other nonspecific skin eruption: Secondary | ICD-10-CM | POA: Diagnosis not present

## 2013-06-16 DIAGNOSIS — L0390 Cellulitis, unspecified: Secondary | ICD-10-CM | POA: Diagnosis not present

## 2013-06-16 DIAGNOSIS — L0291 Cutaneous abscess, unspecified: Secondary | ICD-10-CM | POA: Diagnosis not present

## 2013-06-16 DIAGNOSIS — M542 Cervicalgia: Secondary | ICD-10-CM | POA: Diagnosis not present
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
## 2013-06-21 ENCOUNTER — Encounter (HOSPITAL_COMMUNITY): Payer: Self-pay | Admitting: Psychiatry

## 2013-06-21 ENCOUNTER — Ambulatory Visit (INDEPENDENT_AMBULATORY_CARE_PROVIDER_SITE_OTHER): Payer: Medicare Other | Admitting: Psychiatry

## 2013-06-21 VITALS — BP 134/74 | Ht 71.25 in | Wt 234.6 lb

## 2013-06-21 DIAGNOSIS — F5105 Insomnia due to other mental disorder: Secondary | ICD-10-CM

## 2013-06-21 DIAGNOSIS — F418 Other specified anxiety disorders: Secondary | ICD-10-CM

## 2013-06-21 DIAGNOSIS — F329 Major depressive disorder, single episode, unspecified: Secondary | ICD-10-CM | POA: Diagnosis not present

## 2013-06-21 DIAGNOSIS — F341 Dysthymic disorder: Secondary | ICD-10-CM

## 2013-06-21 DIAGNOSIS — G969 Disorder of central nervous system, unspecified: Secondary | ICD-10-CM

## 2013-06-21 MED ORDER — ESCITALOPRAM OXALATE 10 MG PO TABS: 10.0000 mg | ORAL_TABLET | Freq: Every day | ORAL | Status: DC
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
## 2013-06-21 NOTE — Patient Instructions (Signed)
Observe for the return of the cloudy thinking that comes from the Valium.  Call if problems or concerns.

## 2013-06-22 ENCOUNTER — Ambulatory Visit (HOSPITAL_COMMUNITY): Payer: Medicare Other | Admitting: Psychiatry

## 2013-06-22 DIAGNOSIS — M5412 Radiculopathy, cervical region: Secondary | ICD-10-CM | POA: Diagnosis not present

## 2013-06-22 DIAGNOSIS — M542 Cervicalgia: Secondary | ICD-10-CM | POA: Diagnosis not present

## 2013-06-22 DIAGNOSIS — M4800 Spinal stenosis, site unspecified: Secondary | ICD-10-CM | POA: Diagnosis not present

## 2013-07-03 DIAGNOSIS — H2010 Chronic iridocyclitis, unspecified eye: Secondary | ICD-10-CM | POA: Diagnosis not present

## 2013-07-03 DIAGNOSIS — H40049 Steroid responder, unspecified eye: Secondary | ICD-10-CM | POA: Diagnosis not present

## 2013-07-03 DIAGNOSIS — Z961 Presence of intraocular lens: Secondary | ICD-10-CM | POA: Diagnosis not present

## 2013-07-12 DIAGNOSIS — C679 Malignant neoplasm of bladder, unspecified: Secondary | ICD-10-CM | POA: Diagnosis not present

## 2013-07-20 DIAGNOSIS — M542 Cervicalgia: Secondary | ICD-10-CM | POA: Diagnosis not present

## 2013-07-20 DIAGNOSIS — M5412 Radiculopathy, cervical region: Secondary | ICD-10-CM | POA: Diagnosis not present

## 2013-08-02 ENCOUNTER — Other Ambulatory Visit: Payer: Self-pay

## 2013-08-17 ENCOUNTER — Ambulatory Visit (HOSPITAL_COMMUNITY): Payer: Self-pay | Admitting: Psychiatry

## 2013-08-21 ENCOUNTER — Encounter (HOSPITAL_COMMUNITY): Payer: Self-pay | Admitting: Psychiatry

## 2013-08-21 ENCOUNTER — Ambulatory Visit (INDEPENDENT_AMBULATORY_CARE_PROVIDER_SITE_OTHER): Payer: Medicare Other | Admitting: Psychiatry

## 2013-08-21 VITALS — BP 140/78 | Ht 72.0 in | Wt 235.0 lb

## 2013-08-21 DIAGNOSIS — F329 Major depressive disorder, single episode, unspecified: Secondary | ICD-10-CM

## 2013-08-21 DIAGNOSIS — F331 Major depressive disorder, recurrent, moderate: Secondary | ICD-10-CM

## 2013-08-21 MED ORDER — ESCITALOPRAM OXALATE 10 MG PO TABS: 10.0000 mg | ORAL_TABLET | Freq: Every day | ORAL | Status: DC
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
## 2013-08-24 ENCOUNTER — Ambulatory Visit (INDEPENDENT_AMBULATORY_CARE_PROVIDER_SITE_OTHER): Payer: Medicare Other | Admitting: Orthopedic Surgery

## 2013-08-24 ENCOUNTER — Encounter: Payer: Self-pay | Admitting: Orthopedic Surgery

## 2013-08-24 VITALS — BP 127/71 | Ht 72.0 in | Wt 233.0 lb

## 2013-08-24 DIAGNOSIS — M659 Synovitis and tenosynovitis, unspecified: Secondary | ICD-10-CM | POA: Diagnosis not present

## 2013-08-24 NOTE — Progress Notes (Signed)
Patient ID: EVENS MENO, male   DOB: Jul 27, 1946, 67 y.o.   MRN: [id number]  Chief Complaint  Patient presents with  . 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

## 2013-08-31 ENCOUNTER — Ambulatory Visit: Payer: Self-pay | Admitting: Orthopedic Surgery

## 2013-09-14 DIAGNOSIS — R7309 Other abnormal glucose: Secondary | ICD-10-CM | POA: Diagnosis not present

## 2013-09-14 DIAGNOSIS — I10 Essential (primary) hypertension: Secondary | ICD-10-CM | POA: Diagnosis not present

## 2013-09-14 DIAGNOSIS — Z23 Encounter for immunization: Secondary | ICD-10-CM | POA: Diagnosis not present

## 2013-09-20 IMAGING — CR DG CHEST 2V
2 series · 2 of 2 positions shown · non-contrast
Comparison: 04/28/2013

CLINICAL DATA: Preop cervical stenosis.

CHEST - 2 VIEW

[view not recorded (1 of 2)]
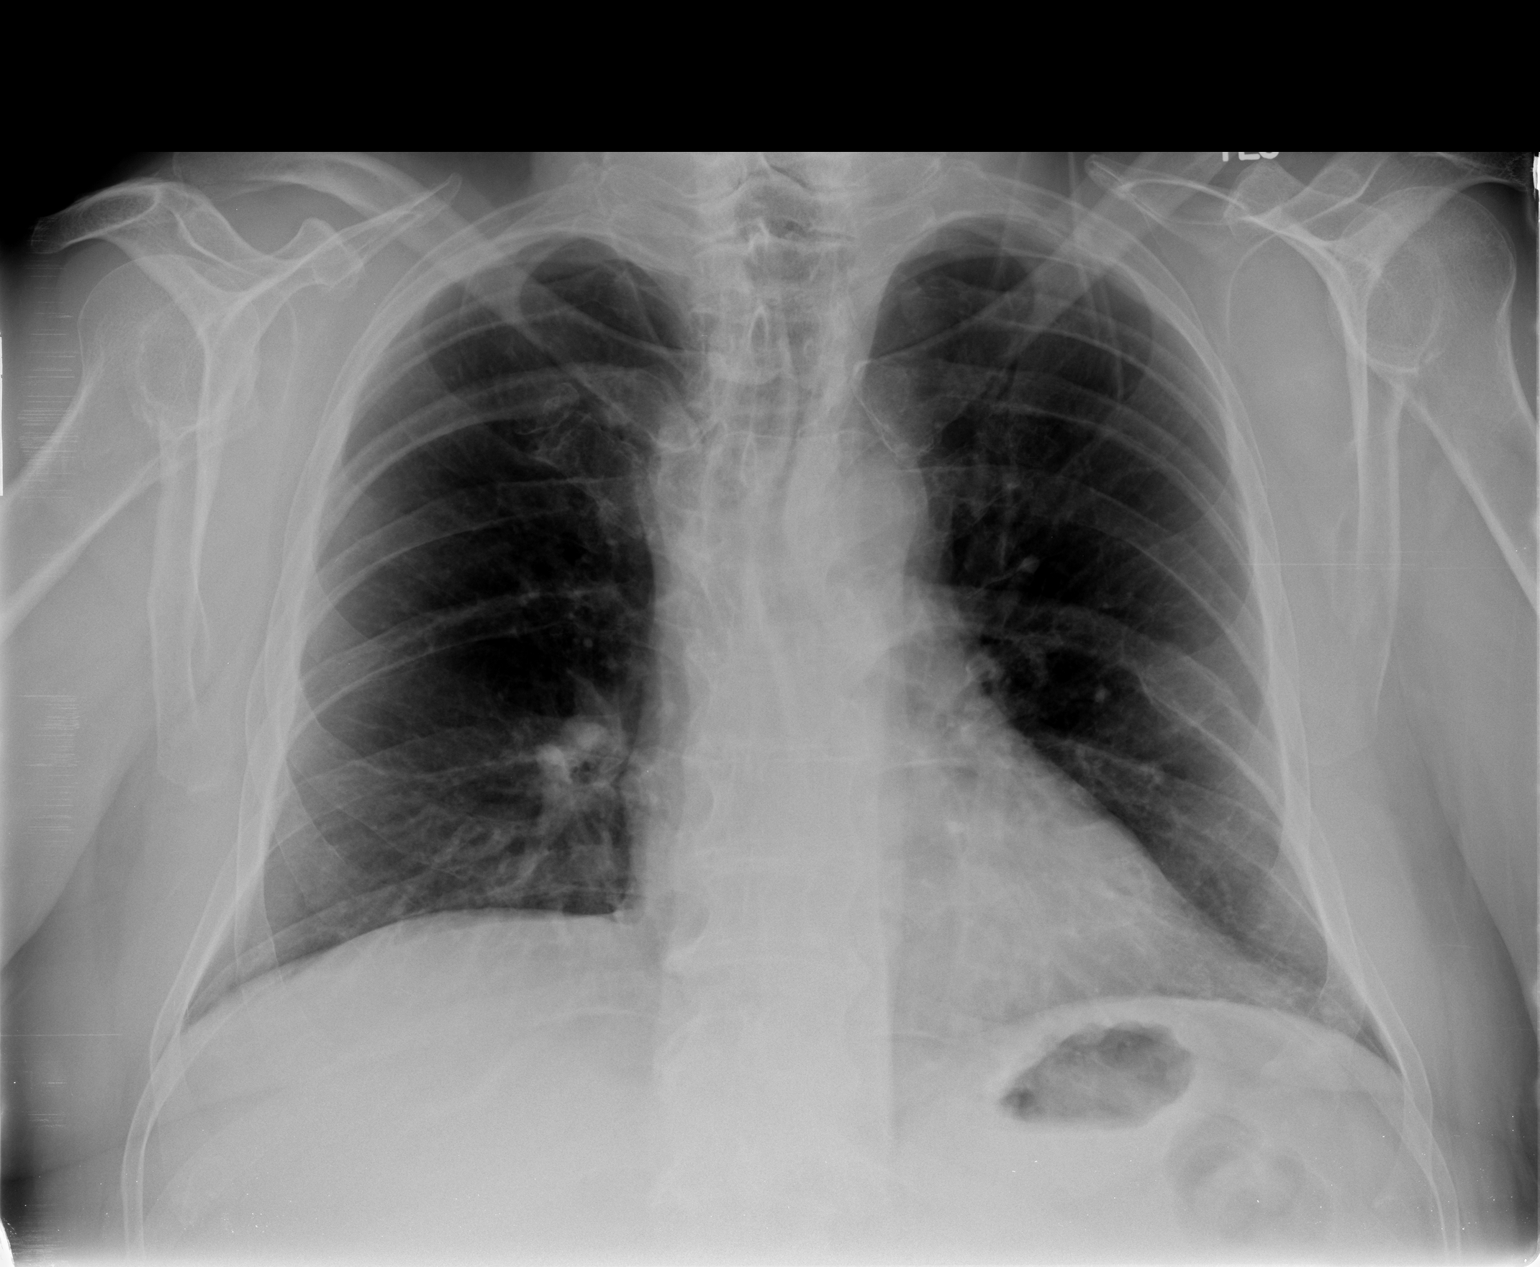

[view not recorded (2 of 2)]
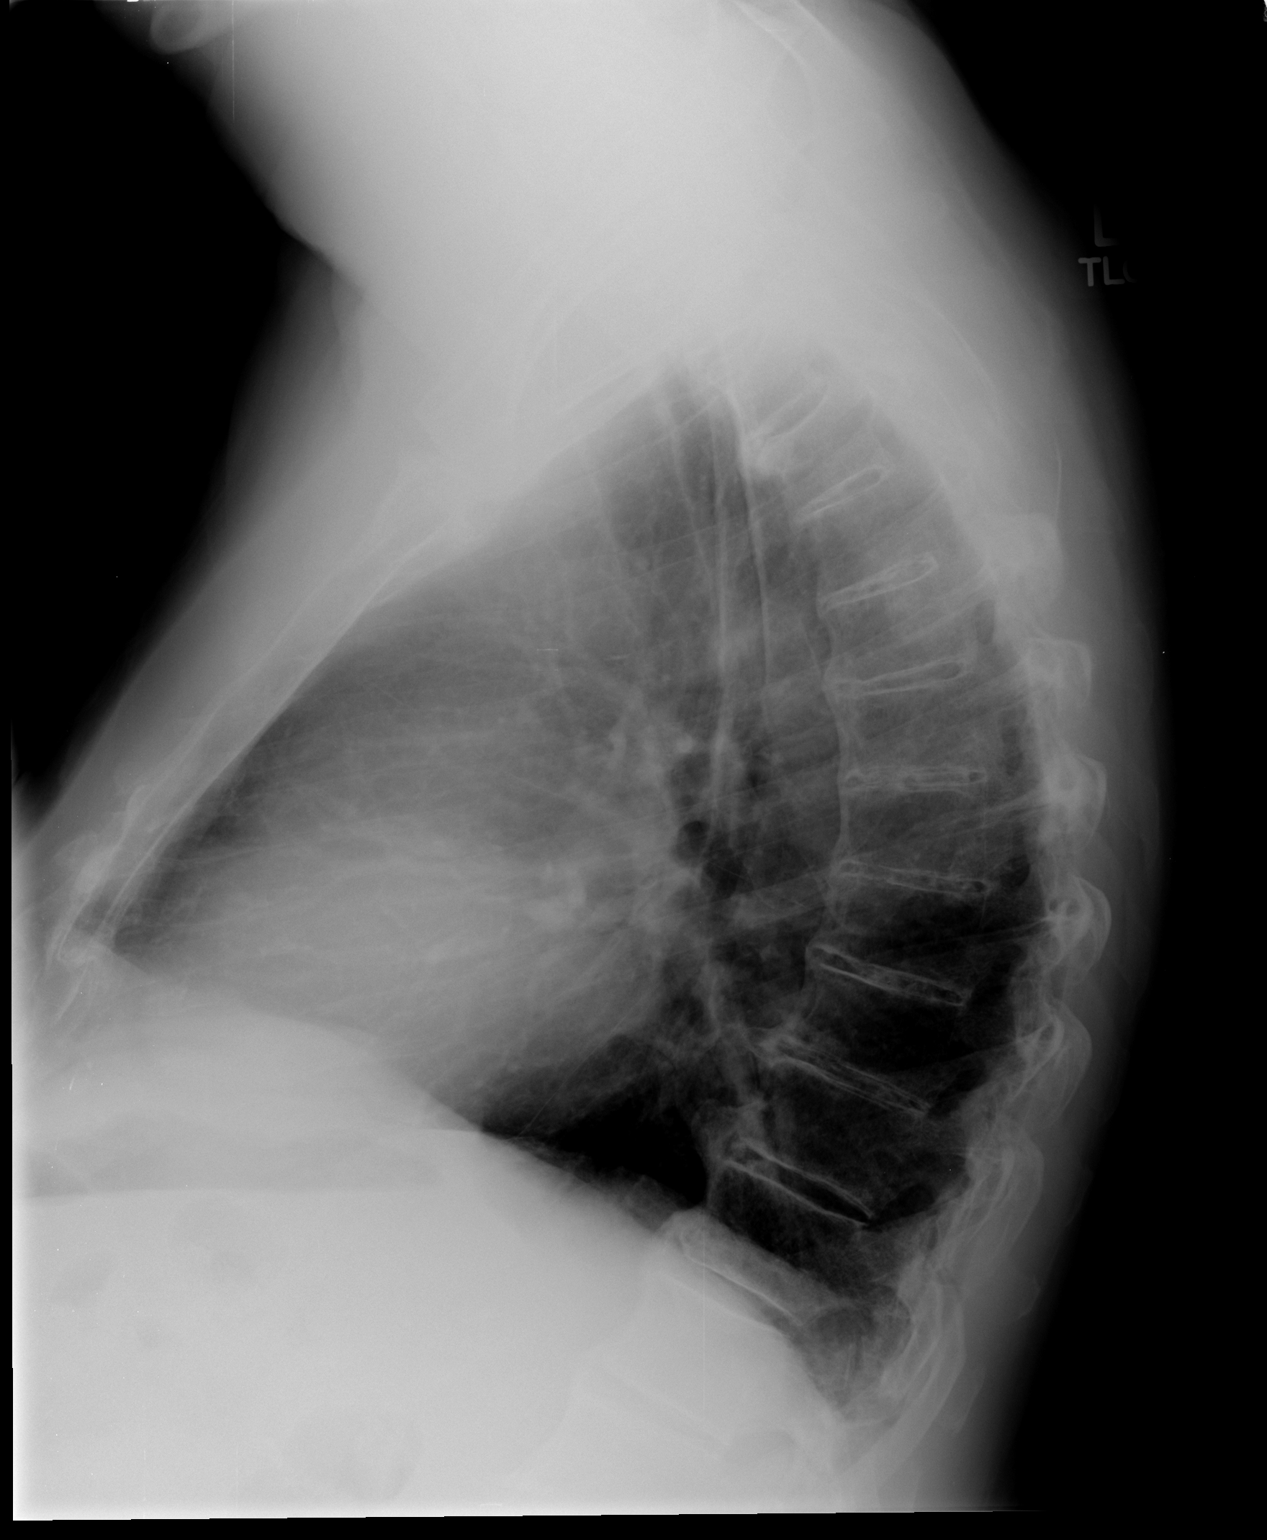

[2 of 2 positions shown; findings below may reference images not displayed]

FINDINGS: Heart is normal size.  No confluent airspace opacities or
effusions.  Degenerative changes in the thoracic spine.
IMPRESSION: No active disease.

## 2013-09-21 DIAGNOSIS — I10 Essential (primary) hypertension: Secondary | ICD-10-CM | POA: Diagnosis not present

## 2013-09-21 DIAGNOSIS — E785 Hyperlipidemia, unspecified: Secondary | ICD-10-CM | POA: Diagnosis not present

## 2013-09-21 DIAGNOSIS — R42 Dizziness and giddiness: Secondary | ICD-10-CM | POA: Diagnosis not present

## 2013-09-21 DIAGNOSIS — R7309 Other abnormal glucose: Secondary | ICD-10-CM | POA: Diagnosis not present

## 2013-09-27 DIAGNOSIS — G4733 Obstructive sleep apnea (adult) (pediatric): Secondary | ICD-10-CM | POA: Diagnosis not present

## 2013-09-29 DIAGNOSIS — H43819 Vitreous degeneration, unspecified eye: Secondary | ICD-10-CM | POA: Diagnosis not present

## 2013-09-29 DIAGNOSIS — H31099 Other chorioretinal scars, unspecified eye: Secondary | ICD-10-CM | POA: Diagnosis not present

## 2013-09-29 DIAGNOSIS — H43399 Other vitreous opacities, unspecified eye: Secondary | ICD-10-CM | POA: Diagnosis not present

## 2013-09-29 DIAGNOSIS — H33029 Retinal detachment with multiple breaks, unspecified eye: Secondary | ICD-10-CM | POA: Diagnosis not present

## 2013-10-02 DIAGNOSIS — H3320 Serous retinal detachment, unspecified eye: Secondary | ICD-10-CM | POA: Diagnosis not present

## 2013-10-02 DIAGNOSIS — H33029 Retinal detachment with multiple breaks, unspecified eye: Secondary | ICD-10-CM | POA: Diagnosis not present

## 2013-10-02 DIAGNOSIS — H35349 Macular cyst, hole, or pseudohole, unspecified eye: Secondary | ICD-10-CM | POA: Diagnosis not present

## 2013-10-03 DIAGNOSIS — H33029 Retinal detachment with multiple breaks, unspecified eye: Secondary | ICD-10-CM | POA: Diagnosis not present

## 2013-10-13 ENCOUNTER — Other Ambulatory Visit (HOSPITAL_COMMUNITY): Payer: Self-pay | Admitting: Psychiatry

## 2013-10-13 ENCOUNTER — Telehealth (HOSPITAL_COMMUNITY): Payer: Self-pay | Admitting: Psychiatry

## 2013-10-13 DIAGNOSIS — F329 Major depressive disorder, single episode, unspecified: Secondary | ICD-10-CM

## 2013-10-13 MED ORDER — ESCITALOPRAM OXALATE 10 MG PO TABS: 10.0000 mg | ORAL_TABLET | Freq: Every day | ORAL | Status: DC

## 2013-10-13 NOTE — Telephone Encounter (Signed)
Refill sent.

## 2013-11-02 ENCOUNTER — Other Ambulatory Visit: Payer: Self-pay

## 2013-11-21 ENCOUNTER — Encounter (HOSPITAL_COMMUNITY): Payer: Self-pay | Admitting: Psychiatry

## 2013-11-21 ENCOUNTER — Ambulatory Visit (INDEPENDENT_AMBULATORY_CARE_PROVIDER_SITE_OTHER): Payer: Medicare Other | Admitting: Psychiatry

## 2013-11-21 VITALS — BP 140/80 | Ht 72.0 in | Wt 242.0 lb

## 2013-11-21 DIAGNOSIS — F329 Major depressive disorder, single episode, unspecified: Secondary | ICD-10-CM

## 2013-11-21 MED ORDER — ESCITALOPRAM OXALATE 10 MG PO TABS: 10.0000 mg | ORAL_TABLET | Freq: Every day | ORAL | Status: DC

## 2013-11-21 MED ORDER — DIAZEPAM 5 MG PO TABS: 5.0000 mg | ORAL_TABLET | Freq: Two times a day (BID) | ORAL | Status: DC | PRN
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
## 2013-12-11 DIAGNOSIS — H33019 Retinal detachment with single break, unspecified eye: Secondary | ICD-10-CM | POA: Diagnosis not present

## 2013-12-11 DIAGNOSIS — H3320 Serous retinal detachment, unspecified eye: Secondary | ICD-10-CM | POA: Diagnosis not present

## 2013-12-11 DIAGNOSIS — H33029 Retinal detachment with multiple breaks, unspecified eye: Secondary | ICD-10-CM | POA: Diagnosis not present

## 2013-12-12 ENCOUNTER — Ambulatory Visit: Payer: Medicare Other | Admitting: Orthopedic Surgery

## 2013-12-12 DIAGNOSIS — H33029 Retinal detachment with multiple breaks, unspecified eye: Secondary | ICD-10-CM | POA: Diagnosis not present

## 2013-12-29 DIAGNOSIS — E785 Hyperlipidemia, unspecified: Secondary | ICD-10-CM | POA: Diagnosis not present

## 2013-12-29 DIAGNOSIS — R7309 Other abnormal glucose: Secondary | ICD-10-CM | POA: Diagnosis not present

## 2013-12-29 DIAGNOSIS — I10 Essential (primary) hypertension: Secondary | ICD-10-CM | POA: Diagnosis not present

## 2014-01-02 DIAGNOSIS — H33009 Unspecified retinal detachment with retinal break, unspecified eye: Secondary | ICD-10-CM | POA: Diagnosis not present

## 2014-01-02 DIAGNOSIS — I10 Essential (primary) hypertension: Secondary | ICD-10-CM | POA: Diagnosis not present

## 2014-01-02 DIAGNOSIS — R7309 Other abnormal glucose: Secondary | ICD-10-CM | POA: Diagnosis not present

## 2014-01-02 DIAGNOSIS — E785 Hyperlipidemia, unspecified: Secondary | ICD-10-CM | POA: Diagnosis not present

## 2014-01-23 ENCOUNTER — Telehealth (HOSPITAL_COMMUNITY): Payer: Self-pay

## 2014-01-23 ENCOUNTER — Other Ambulatory Visit (HOSPITAL_COMMUNITY): Payer: Self-pay | Admitting: Psychiatry

## 2014-01-23 DIAGNOSIS — F329 Major depressive disorder, single episode, unspecified: Secondary | ICD-10-CM

## 2014-01-23 DIAGNOSIS — J069 Acute upper respiratory infection, unspecified: Secondary | ICD-10-CM | POA: Diagnosis not present

## 2014-01-23 DIAGNOSIS — J019 Acute sinusitis, unspecified: Secondary | ICD-10-CM | POA: Diagnosis not present

## 2014-01-23 MED ORDER — ESCITALOPRAM OXALATE 10 MG PO TABS: 10.0000 mg | ORAL_TABLET | Freq: Every day | ORAL | Status: DC

## 2014-01-23 NOTE — Telephone Encounter (Signed)
done

## 2014-02-21 ENCOUNTER — Encounter (HOSPITAL_COMMUNITY): Payer: Self-pay | Admitting: Psychiatry

## 2014-02-21 ENCOUNTER — Ambulatory Visit (INDEPENDENT_AMBULATORY_CARE_PROVIDER_SITE_OTHER): Payer: Medicare Other | Admitting: Psychiatry

## 2014-02-21 VITALS — BP 150/88 | Ht 72.0 in | Wt 246.0 lb

## 2014-02-21 DIAGNOSIS — F329 Major depressive disorder, single episode, unspecified: Secondary | ICD-10-CM

## 2014-02-21 MED ORDER — ESCITALOPRAM OXALATE 10 MG PO TABS: 10.0000 mg | ORAL_TABLET | Freq: Every day | ORAL | Status: DC

## 2014-02-21 MED ORDER — DIAZEPAM 5 MG PO TABS: 5.0000 mg | ORAL_TABLET | Freq: Two times a day (BID) | ORAL | Status: DC | PRN
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
## 2014-02-26 DIAGNOSIS — H33029 Retinal detachment with multiple breaks, unspecified eye: Secondary | ICD-10-CM | POA: Diagnosis not present

## 2014-03-01 DIAGNOSIS — H524 Presbyopia: Secondary | ICD-10-CM | POA: Diagnosis not present

## 2014-03-01 DIAGNOSIS — H04129 Dry eye syndrome of unspecified lacrimal gland: Secondary | ICD-10-CM | POA: Diagnosis not present

## 2014-03-01 DIAGNOSIS — H43399 Other vitreous opacities, unspecified eye: Secondary | ICD-10-CM | POA: Diagnosis not present

## 2014-03-09 DIAGNOSIS — H33019 Retinal detachment with single break, unspecified eye: Secondary | ICD-10-CM | POA: Diagnosis not present

## 2014-03-14 DIAGNOSIS — H35379 Puckering of macula, unspecified eye: Secondary | ICD-10-CM | POA: Diagnosis not present

## 2014-03-14 DIAGNOSIS — H5319 Other subjective visual disturbances: Secondary | ICD-10-CM | POA: Diagnosis not present

## 2014-03-14 DIAGNOSIS — H3320 Serous retinal detachment, unspecified eye: Secondary | ICD-10-CM | POA: Insufficient documentation

## 2014-03-14 DIAGNOSIS — Z961 Presence of intraocular lens: Secondary | ICD-10-CM | POA: Insufficient documentation

## 2014-03-14 DIAGNOSIS — H35372 Puckering of macula, left eye: Secondary | ICD-10-CM | POA: Insufficient documentation

## 2014-03-14 DIAGNOSIS — Z8669 Personal history of other diseases of the nervous system and sense organs: Secondary | ICD-10-CM | POA: Diagnosis not present

## 2014-03-15 ENCOUNTER — Ambulatory Visit (INDEPENDENT_AMBULATORY_CARE_PROVIDER_SITE_OTHER): Payer: Self-pay | Admitting: Otolaryngology

## 2014-05-01 ENCOUNTER — Ambulatory Visit (INDEPENDENT_AMBULATORY_CARE_PROVIDER_SITE_OTHER): Payer: Medicare Other | Admitting: Orthopedic Surgery

## 2014-05-01 ENCOUNTER — Ambulatory Visit: Payer: Medicare Other

## 2014-05-01 ENCOUNTER — Encounter: Payer: Self-pay | Admitting: Orthopedic Surgery

## 2014-05-01 VITALS — BP 135/77 | Ht 72.0 in | Wt 246.0 lb

## 2014-05-01 DIAGNOSIS — M25579 Pain in unspecified ankle and joints of unspecified foot: Secondary | ICD-10-CM

## 2014-05-01 DIAGNOSIS — M25572 Pain in left ankle and joints of left foot: Secondary | ICD-10-CM

## 2014-05-01 DIAGNOSIS — M659 Synovitis and tenosynovitis, unspecified: Secondary | ICD-10-CM

## 2014-05-01 MED ORDER — HYDROCODONE-ACETAMINOPHEN 5-325 MG PO TABS: 1.0000 | ORAL_TABLET | Freq: Four times a day (QID) | ORAL | Status: DC | PRN
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
## 2014-05-01 NOTE — Patient Instructions (Signed)
We recommend surgery on your foot, if you decide to proceed let us know and we will set you up to see Dr. Caprice Beaver

## 2014-05-02 ENCOUNTER — Other Ambulatory Visit: Payer: Self-pay | Admitting: *Deleted

## 2014-05-02 ENCOUNTER — Telehealth: Payer: Self-pay | Admitting: *Deleted

## 2014-05-02 DIAGNOSIS — M659 Synovitis and tenosynovitis, unspecified: Secondary | ICD-10-CM

## 2014-05-02 NOTE — Telephone Encounter (Signed)
Appointment with Dr. Caprice Beaver was made for 06/12/14 at 2:00 pm. Patient is aware of appointment date and time.

## 2014-05-07 DIAGNOSIS — R7309 Other abnormal glucose: Secondary | ICD-10-CM | POA: Diagnosis not present

## 2014-05-07 DIAGNOSIS — E785 Hyperlipidemia, unspecified: Secondary | ICD-10-CM | POA: Diagnosis not present

## 2014-05-07 DIAGNOSIS — I10 Essential (primary) hypertension: Secondary | ICD-10-CM | POA: Diagnosis not present

## 2014-05-09 DIAGNOSIS — E785 Hyperlipidemia, unspecified: Secondary | ICD-10-CM | POA: Diagnosis not present

## 2014-05-09 DIAGNOSIS — I10 Essential (primary) hypertension: Secondary | ICD-10-CM | POA: Diagnosis not present

## 2014-05-09 DIAGNOSIS — R7309 Other abnormal glucose: Secondary | ICD-10-CM | POA: Diagnosis not present

## 2014-05-09 DIAGNOSIS — M25579 Pain in unspecified ankle and joints of unspecified foot: Secondary | ICD-10-CM | POA: Diagnosis not present

## 2014-05-18 ENCOUNTER — Ambulatory Visit (INDEPENDENT_AMBULATORY_CARE_PROVIDER_SITE_OTHER): Payer: Medicare Other | Admitting: Psychiatry

## 2014-05-18 ENCOUNTER — Encounter (HOSPITAL_COMMUNITY): Payer: Self-pay | Admitting: Psychiatry

## 2014-05-18 VITALS — BP 130/80 | Ht 72.0 in | Wt 243.0 lb

## 2014-05-18 DIAGNOSIS — F329 Major depressive disorder, single episode, unspecified: Secondary | ICD-10-CM

## 2014-05-18 MED ORDER — DIAZEPAM 5 MG PO TABS: 5.0000 mg | ORAL_TABLET | Freq: Two times a day (BID) | ORAL | Status: DC | PRN

## 2014-05-18 MED ORDER — ESCITALOPRAM OXALATE 10 MG PO TABS: 10.0000 mg | ORAL_TABLET | Freq: Every day | ORAL | Status: DC
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
## 2014-05-28 DIAGNOSIS — H33029 Retinal detachment with multiple breaks, unspecified eye: Secondary | ICD-10-CM | POA: Diagnosis not present

## 2014-05-28 DIAGNOSIS — H43399 Other vitreous opacities, unspecified eye: Secondary | ICD-10-CM | POA: Diagnosis not present

## 2014-05-28 DIAGNOSIS — H43819 Vitreous degeneration, unspecified eye: Secondary | ICD-10-CM | POA: Diagnosis not present

## 2014-05-28 DIAGNOSIS — H35379 Puckering of macula, unspecified eye: Secondary | ICD-10-CM | POA: Diagnosis not present

## 2014-05-28 DIAGNOSIS — H31099 Other chorioretinal scars, unspecified eye: Secondary | ICD-10-CM | POA: Diagnosis not present

## 2014-06-12 DIAGNOSIS — M79609 Pain in unspecified limb: Secondary | ICD-10-CM | POA: Diagnosis not present

## 2014-06-12 DIAGNOSIS — Q667 Congenital pes cavus, unspecified foot: Secondary | ICD-10-CM | POA: Diagnosis not present

## 2014-06-12 DIAGNOSIS — M19079 Primary osteoarthritis, unspecified ankle and foot: Secondary | ICD-10-CM | POA: Diagnosis not present

## 2014-06-22 DIAGNOSIS — S86819A Strain of other muscle(s) and tendon(s) at lower leg level, unspecified leg, initial encounter: Secondary | ICD-10-CM | POA: Diagnosis not present

## 2014-06-22 DIAGNOSIS — S838X9A Sprain of other specified parts of unspecified knee, initial encounter: Secondary | ICD-10-CM | POA: Diagnosis not present

## 2014-06-27 DIAGNOSIS — Z5189 Encounter for other specified aftercare: Secondary | ICD-10-CM | POA: Diagnosis not present

## 2014-06-27 DIAGNOSIS — M25579 Pain in unspecified ankle and joints of unspecified foot: Secondary | ICD-10-CM | POA: Diagnosis not present

## 2014-07-02 DIAGNOSIS — M7750 Other enthesopathy of unspecified foot: Secondary | ICD-10-CM | POA: Diagnosis not present

## 2014-07-02 DIAGNOSIS — M216X9 Other acquired deformities of unspecified foot: Secondary | ICD-10-CM | POA: Diagnosis not present

## 2014-07-06 ENCOUNTER — Ambulatory Visit (HOSPITAL_COMMUNITY)
Admission: RE | Admit: 2014-07-06 | Discharge: 2014-07-06 | Disposition: A | Discharge status: Home / Self Care | Payer: Medicare Other | Source: Ambulatory Visit | Attending: Orthopedic Surgery | Admitting: Orthopedic Surgery

## 2014-07-06 DIAGNOSIS — M25559 Pain in unspecified hip: Secondary | ICD-10-CM | POA: Diagnosis not present

## 2014-07-06 DIAGNOSIS — M25579 Pain in unspecified ankle and joints of unspecified foot: Secondary | ICD-10-CM | POA: Insufficient documentation

## 2014-07-06 DIAGNOSIS — M25676 Stiffness of unspecified foot, not elsewhere classified: Secondary | ICD-10-CM | POA: Diagnosis not present

## 2014-07-06 DIAGNOSIS — R269 Unspecified abnormalities of gait and mobility: Secondary | ICD-10-CM | POA: Diagnosis not present

## 2014-07-06 DIAGNOSIS — M25659 Stiffness of unspecified hip, not elsewhere classified: Secondary | ICD-10-CM | POA: Insufficient documentation

## 2014-07-06 DIAGNOSIS — IMO0001 Reserved for inherently not codable concepts without codable children: Secondary | ICD-10-CM | POA: Diagnosis not present

## 2014-07-06 DIAGNOSIS — M25673 Stiffness of unspecified ankle, not elsewhere classified: Secondary | ICD-10-CM | POA: Insufficient documentation

## 2014-07-06 DIAGNOSIS — E782 Mixed hyperlipidemia: Secondary | ICD-10-CM | POA: Insufficient documentation

## 2014-07-06 DIAGNOSIS — I10 Essential (primary) hypertension: Secondary | ICD-10-CM | POA: Insufficient documentation
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
## 2014-07-12 ENCOUNTER — Ambulatory Visit (HOSPITAL_COMMUNITY)
Admission: RE | Admit: 2014-07-12 | Discharge: 2014-07-12 | Disposition: A | Discharge status: Home / Self Care | Payer: Medicare Other | Source: Ambulatory Visit | Attending: Physical Therapy | Admitting: Physical Therapy

## 2014-07-12 DIAGNOSIS — IMO0001 Reserved for inherently not codable concepts without codable children: Secondary | ICD-10-CM | POA: Diagnosis not present

## 2014-07-12 NOTE — Progress Notes (Signed)
Physical Therapy Treatment Patient Details  Name: Zachary [name] MRN: [id number] Date of Birth: [birth year]  Today's Date: 07/12/2014 Time: 0800-0845 PT Time Calculation (min): 45 min    Charges: [phone], therEx [phone] Visit#: 2 of 12  Re-eval: 08/05/14 Assessment Diagnosis: Lt foot stiffness secondary to pain and aquired cavorvaus deformity of Lt foot.  Next MD Visit: 3 months Prior Therapy: no  Authorization: Medicare  Authorization Visit#: 2 of 12   Subjective: Symptoms/Limitations Symptoms: Patient states good performance of HEP notign that his foot hurts during but feels better follwoin stretches  Exercise/Treatments Stretches Active Hamstring Stretch: 5 reps;10 seconds;Limitations Active Hamstring Stretch Limitations: 3ways Hip Flexor Stretch: 5 reps;10 seconds;Limitations Hip Flexor Stretch Limitations: 3 ways Gastroc Stretch: 5 reps;10 seconds;Limitations Gastroc Stretch Limitations: 3way Soleus Stretch: 5 reps;10 seconds;Limitations Soleus Stretch Limitations: 3 way Standing Other Standing Knee Exercises: 3D ankle excursion 10x Seated Other Seated Knee Exercises: seated plantar fascia stretch 10x 10 seconds Manual Therapy Manual Therapy: Joint mobilization Joint Mobilization: talocrural, subtalar, midtarsal, and great toe grade 2-3 joint mobilizations Physical Therapy Assessment and Plan PT Assessment and Plan Clinical Impression Statement: Patient initiates LE stretching program this session with manual theraqpy utilized to augment mobility gains in subtalar, talacrural and midfoot mobility. patient noted pain with point tenderness below lateral malleolus throughout therapy with minor improvement following stretches.  PT Plan: Continue mobility exercises next sessios to further progress mobility. add standing groin stretch    Goals Home Exercise Program Pt/caregiver will Perform Home Exercise Program: For increased ROM PT Goal: Perform Home Exercise Program -  Progress: Progressing toward goal PT Short Term Goals PT Short Term Goal 1: Patient will increase ankle dorsiflexion to >15 degrees to decrease early heel off during walking PT Short Term Goal 1 - Progress: Progressing toward goal PT Short Term Goal 2: Patient will icrease ankle plantar flexion to 40 degrees to be able to stand on tip of toes to reach to high kitchen shelf PT Short Term Goal 2 - Progress: Progressing toward goal PT Short Term Goal 3: Patient will increase knee extension to 0 degrees to be able to walk with a longer stride length PT Short Term Goal 3 - Progress: Progressing toward goal PT Short Term Goal 4: Patint will increase hip extension to ambulate with increased stride length PT Short Term Goal 4 - Progress: Progressing toward goal PT Short Term Goal 5: Patient will increase ankle inversion/eversion to 30/15 degrees to improve normal ankle AROM PT Short Term Goal 5 - Progress: Progressing toward goal PT Long Term Goals PT Long Term Goal 1: Patient will increase great toe dorsiflextion to 40 degree to decrease early toe off durign gait PT Long Term Goal 1 - Progress: Progressing toward goal PT Long Term Goal 2: Patient will increase hip internal rotation to 30 degrees to increase ability to absorb impact of walking PT Long Term Goal 2 - Progress: Progressing toward goal Long Term Goal 3: Patient will be able to perform sit to stand 5x in less than 15seconds without UE support indicatign improved hip strength and patient not at high risk of falls.  Long Term Goal 3 Progress: Progressing toward goal Long Term Goal 4: Patient will be able to walk 30 minutes with pain less than 3/10 Long Term Goal 4 Progress: Progressing toward goal  Problem List Patient Active Problem List   Diagnosis Date Noted  . HNP (herniated nucleus pulposus), cervical 06/05/2013    Class: Diagnosis of  . 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  .  Muscle spasms of neck 04/04/2013  . Insomnia secondary to  depression with anxiety 03/01/2013  . OA (osteoarthritis) of knee 02/22/2013  . Iliotibial band syndrome of left side 11/22/2012  . Localized, primary osteoarthritis of the ankle and foot 10/11/2012  . Difficulty in walking 08/17/2012  . Stiffness of joint, not elsewhere classified, ankle and foot 08/17/2012  . Peroneal tendinitis 08/16/2012  . Shortness of breath 02/09/2012  . Precordial pain 02/09/2012  . Essential hypertension, benign 02/09/2012  . Mixed hyperlipidemia 02/09/2012  . Dysthymia 02/04/2012  . Pain in joint, ankle and foot 12/23/2011  . Abnormality of gait 12/23/2011  . Lateral epicondylitis/tennis elbow 05/06/2011  . Trigger point of extremity 05/06/2011  . TENOSYNOVITIS OF FOOT AND ANKLE 10/22/2010    PT - End of Session Activity Tolerance: Patient tolerated treatment well General Behavior During Therapy: WFL for tasks assessed/performed PT Plan of Care PT Home Exercise Plan: 3way calf stretch knee bent and knee straight  GP    Daly Whipkey R 07/12/2014, 8:44 AM

## 2014-07-13 ENCOUNTER — Ambulatory Visit (HOSPITAL_COMMUNITY): Payer: Self-pay | Admitting: Physical Therapy

## 2014-07-16 ENCOUNTER — Ambulatory Visit (HOSPITAL_COMMUNITY)
Admission: RE | Admit: 2014-07-16 | Discharge: 2014-07-16 | Disposition: A | Discharge status: Home / Self Care | Payer: Medicare Other | Source: Ambulatory Visit | Attending: Internal Medicine | Admitting: Internal Medicine

## 2014-07-16 DIAGNOSIS — IMO0001 Reserved for inherently not codable concepts without codable children: Secondary | ICD-10-CM | POA: Diagnosis not present

## 2014-07-16 NOTE — Progress Notes (Signed)
Physical Therapy Treatment Patient Details  Name: Zachary [name] MRN: [id number] Date of Birth: 04/30/1946  Today's Date: 07/16/2014 Time: 0804-0850 PT Time Calculation (min): 46 min  Visit#: 3 of 12  Re-eval: 08/05/14 Authorization: Medicare  Authorization Visit#: 3 of 12  Charges:  therex [phone] (33'), manual [phone] (12')  Subjective: Symptoms/Limitations Symptoms: Pt states he is having surgery end of the year on Lt ankle, states therapy has not helped yet and his ankle continues to [place] 24/7.  Currently without pain in Rt ankle, Lt 3/10. Pain Assessment Currently in Pain?: Yes Pain Score: 3  Pain Location: Ankle Pain Orientation: Left   Exercise/Treatments Stretches Active Hamstring Stretch: 2 reps;10 seconds;Limitations Active Hamstring Stretch Limitations: 3ways Hip Flexor Stretch: 2 reps;10 seconds Hip Flexor Stretch Limitations: 3 ways Gastroc Stretch: 5 reps;10 seconds;Limitations Gastroc Stretch Limitations: slant board Soleus Stretch: Limitations Soleus Stretch Limitations: groin stretch 2X10" each Standing Heel Raises: 10 reps;Limitations Heel Raises Limitations: toeraisee 10 reps   Manual Therapy Manual Therapy: Joint mobilization Joint Mobilization: talocrural, subtalar, midtarsal, and great toe grade 2-3 joint mobilizations Lt foot  Physical Therapy Assessment and Plan PT Assessment and Plan Clinical Impression Statement: Patient demonstrates  LE stretching correctly.  STates he is completing at home; decreased reps.  Added heel and toe raises.  Noted weakness with heelraises in Lt with tremors.  Patient noted pain with manual for DF and into calf region.  PT Plan: Continue mobility exercises next session to further progress mobility.  Continue manual techniques.      Problem List Patient Active Problem List   Diagnosis Date Noted  . HNP (herniated nucleus pulposus), cervical 06/05/2013    Class: Diagnosis of  . 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  . Muscle  spasms of neck 04/04/2013  . 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 OA (osteoarthritis) of knee 02/22/2013  . Iliotibial band syndrome of left side 11/22/2012  . Localized, primary osteoarthritis of the ankle and foot 10/11/2012  . Difficulty in walking 08/17/2012  . Stiffness of joint, not elsewhere classified, ankle and foot 08/17/2012  . Peroneal tendinitis 08/16/2012  . Shortness of breath 02/09/2012  . Precordial pain 02/09/2012  . Essential hypertension, benign 02/09/2012  . Mixed hyperlipidemia 02/09/2012  . Dysthymia 02/04/2012  . Pain in joint, ankle and foot 12/23/2011  . Abnormality of gait 12/23/2011  . Lateral epicondylitis/tennis elbow 05/06/2011  . Trigger point of extremity 05/06/2011  . TENOSYNOVITIS OF FOOT AND ANKLE 10/22/2010    PT - End of Session Activity Tolerance: Patient tolerated treatment well General Behavior During Therapy: WFL for tasks assessed/performed PT Plan of Care PT Home Exercise Plan: 3way calf stretch knee bent and knee straight  GP    Teena Irani, PTA/CLT 07/16/2014, 9:11 AM

## 2014-07-17 DIAGNOSIS — N401 Enlarged prostate with lower urinary tract symptoms: Secondary | ICD-10-CM | POA: Diagnosis not present

## 2014-07-17 DIAGNOSIS — C679 Malignant neoplasm of bladder, unspecified: Secondary | ICD-10-CM | POA: Diagnosis not present

## 2014-07-18 ENCOUNTER — Ambulatory Visit (HOSPITAL_COMMUNITY)
Admission: RE | Admit: 2014-07-18 | Discharge: 2014-07-18 | Disposition: A | Discharge status: Home / Self Care | Payer: Medicare Other | Source: Ambulatory Visit | Attending: Orthopedic Surgery | Admitting: Orthopedic Surgery

## 2014-07-18 DIAGNOSIS — IMO0001 Reserved for inherently not codable concepts without codable children: Secondary | ICD-10-CM | POA: Diagnosis not present

## 2014-07-18 NOTE — Progress Notes (Signed)
Physical Therapy Treatment Patient Details  Name: Zachary [name] MRN: [id number] Date of Birth: 1946/01/25  Today's Date: 07/18/2014 Time: 7829-5621 PT Time Calculation (min): 42 min Charge: TE [id number], Manual [id number]   Visit#: 4 of 12  Re-eval: 08/05/14 Assessment Diagnosis: Lt foot stiffness secondary to pain and aquired cavorvaus deformity of Lt foot.  Next MD Visit: Doran Durand 3 months Prior Therapy: no  Authorization: Medicare  Authorization Time Period:    Authorization Visit#: 4 of 12   Subjective: Symptoms/Limitations Symptoms: Pt stated pain free today Pain Assessment Currently in Pain?: No/denies  Objective:   Exercise/Treatments Stretches Active Hamstring Stretch: 3 reps;30 seconds;Limitations Active Hamstring Stretch Limitations: 3 ways Hip Flexor Stretch: 2 reps;20 seconds;Limitations Hip Flexor Stretch Limitations: 3 ways Piriformis Stretch: 3 reps;30 seconds Gastroc Stretch: 2 reps;30 seconds;Limitations Gastroc Stretch Limitations: 3 way directions against wall for gastroc and soleus Soleus Stretch: 2 reps;30 seconds Soleus Stretch Limitations: groin stretch 2X20" each Standing Other Standing Knee Exercises: 3D ankle excursion Supine Quad Sets: Left;10 reps   Manual Therapy Manual Therapy: Joint mobilization Joint Mobilization: talocrural, subtalar, midtarsal, and great toe grade 2-3 joint mobilizations Lt foot  Physical Therapy Assessment and Plan PT Assessment and Plan Clinical Impression Statement: Session focus on improving LE mobilty with stretches, 3D ankle excursion and manual techniques to improve joint mobilty.  Added piriformis to improve hip mobilty and quad sets to improve knee extension with gait.  No reports of increased pain through sessoin. PT Plan: Continue mobility exercises next session to further progress mobility.  Continue manual techniques.     Goals PT Short Term Goals PT Short Term Goal 1: Patient will increase ankle  dorsiflexion to >15 degrees to decrease early heel off during walking PT Short Term Goal 1 - Progress: Progressing toward goal PT Short Term Goal 2: Patient will icrease ankle plantar flexion to 40 degrees to be able to stand on tip of toes to reach to high kitchen shelf PT Short Term Goal 2 - Progress: Progressing toward goal PT Short Term Goal 3: Patient will increase knee extension to 0 degrees to be able to walk with a longer stride length PT Short Term Goal 4: Patint will increase hip extension to ambulate with increased stride length PT Short Term Goal 5: Patient will increase ankle inversion/eversion to 30/15 degrees to improve normal ankle AROM PT Short Term Goal 5 - Progress: Progressing toward goal PT Long Term Goals PT Long Term Goal 1: Patient will increase great toe dorsiflextion to 40 degree to decrease early toe off durign gait PT Long Term Goal 2: Patient will increase hip internal rotation to 30 degrees to increase ability to absorb impact of walking Long Term Goal 3: Patient will be able to perform sit to stand 5x in less than 15seconds without UE support indicatign improved hip strength and patient not at high risk of falls.  Long Term Goal 4: Patient will be able to walk 30 minutes with pain less than 3/10  Problem List Patient Active Problem List   Diagnosis Date Noted  . HNP (herniated nucleus pulposus), cervical 06/05/2013    Class: Diagnosis of  . 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  . Muscle spasms of neck 04/04/2013  . 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 OA (osteoarthritis) of knee 02/22/2013  . Iliotibial band syndrome of left side 11/22/2012  . Localized, primary osteoarthritis of the ankle and foot 10/11/2012  . Difficulty in walking 08/17/2012  . Stiffness of joint, not elsewhere classified, ankle  and foot 08/17/2012  . Peroneal tendinitis 08/16/2012  . Shortness of breath 02/09/2012  . Precordial pain 02/09/2012  . Essential hypertension,  benign 02/09/2012  . Mixed hyperlipidemia 02/09/2012  . Dysthymia 02/04/2012  . Pain in joint, ankle and foot 12/23/2011  . Abnormality of gait 12/23/2011  . Lateral epicondylitis/tennis elbow 05/06/2011  . Trigger point of extremity 05/06/2011  . TENOSYNOVITIS OF FOOT AND ANKLE 10/22/2010    PT - End of Session Activity Tolerance: Patient tolerated treatment well General Behavior During Therapy: [facility] for tasks assessed/performed  GP    Aldona Lento 07/18/2014, 8:52 AM

## 2014-07-20 ENCOUNTER — Ambulatory Visit (HOSPITAL_COMMUNITY)
Admission: RE | Admit: 2014-07-20 | Discharge: 2014-07-20 | Disposition: A | Discharge status: Home / Self Care | Payer: Medicare Other | Source: Ambulatory Visit | Attending: Internal Medicine | Admitting: Internal Medicine

## 2014-07-20 DIAGNOSIS — IMO0001 Reserved for inherently not codable concepts without codable children: Secondary | ICD-10-CM | POA: Diagnosis not present

## 2014-07-20 NOTE — Progress Notes (Signed)
Physical Therapy Treatment Patient Details  Name: Zachary [name] MRN: [id number] Date of Birth: 03-13-1946  Today's Date: 07/20/2014 Time: 7322-0254 PT Time Calculation (min): 45 min    Charges: [id number] Manual therapy, [phone] therEx Visit#: 5 of 12  Re-eval: 08/05/14 Assessment Diagnosis: Lt foot stiffness secondary to pain and aquired cavorvaus deformity of Lt foot.  Next MD Visit: Doran Durand 3 months Prior Therapy: no  Authorization: Medicare  Authorization Visit#: 5 of 12   Subjective: Symptoms/Limitations Symptoms: Patient states he continues to feel the improvements, notes mderate tederness on the plantar surface of the great toe and posterior to the lateral  malleolus.  Pain Assessment Currently in Pain?: No/denies  Exercise/Treatments Stretches Active Hamstring Stretch: 3 reps;Limitations;20 seconds Active Hamstring Stretch Limitations: 3 ways Hip Flexor Stretch: 2 reps;20 seconds;Limitations Hip Flexor Stretch Limitations: 3 ways Piriformis Stretch: 3 reps;20 seconds Gastroc Stretch: Limitations;3 reps;20 seconds Gastroc Stretch Limitations: 3way Soleus Stretch: Limitations;20 seconds;3 reps Soleus Stretch Limitations: 3 way Standing Heel Raises: 10 reps;Limitations Heel Raises Limitations: toeraisee 10 reps Other Standing Knee Exercises: 3D ankle excursion 10x Other Standing Knee Exercises: 3 way knee driver  76E with and without Blue T-band  Manual Therapy Joint Mobilization: talocrural, subtalar, midtarsal, and great toe grade 2-3 joint mobilizations Lt foot, mobilization with movement of laus during 3way knee driver  Physical Therapy Assessment and Plan PT Assessment and Plan Clinical Impression Statement: Patient displays continued improvement in ankle pain and symptoms with improving mobility. Patient notes that he is movign better and able to now perform yard wok. this session continued focus on progressing ankle mobility with tow and heel raises performed at  the end of the session to improvement maintenace of ROM gains. PT Plan: Continue mobility exercises next session to further progress mobility.  Continue manual techniques.     Goals PT Short Term Goals PT Short Term Goal 1: Patient will increase ankle dorsiflexion to >15 degrees to decrease early heel off during walking PT Short Term Goal 1 - Progress: Progressing toward goal PT Short Term Goal 2: Patient will icrease ankle plantar flexion to 40 degrees to be able to stand on tip of toes to reach to high kitchen shelf PT Short Term Goal 2 - Progress: Progressing toward goal PT Short Term Goal 3: Patient will increase knee extension to 0 degrees to be able to walk with a longer stride length PT Short Term Goal 3 - Progress: Progressing toward goal PT Short Term Goal 4: Patint will increase hip extension to ambulate with increased stride length PT Short Term Goal 4 - Progress: Progressing toward goal PT Short Term Goal 5: Patient will increase ankle inversion/eversion to 30/15 degrees to improve normal ankle AROM PT Short Term Goal 5 - Progress: Progressing toward goal PT Long Term Goals PT Long Term Goal 1: Patient will increase great toe dorsiflextion to 40 degree to decrease early toe off durign gait PT Long Term Goal 1 - Progress: Progressing toward goal PT Long Term Goal 2: Patient will increase hip internal rotation to 30 degrees to increase ability to absorb impact of walking PT Long Term Goal 2 - Progress: Progressing toward goal Long Term Goal 3: Patient will be able to perform sit to stand 5x in less than 15seconds without UE support indicatign improved hip strength and patient not at high risk of falls.  Long Term Goal 3 Progress: Progressing toward goal Long Term Goal 4: Patient will be able to walk 30 minutes with pain less than 3/10 Long  Term Goal 4 Progress: Progressing toward goal  Problem List Patient Active Problem List   Diagnosis Date Noted  . HNP (herniated nucleus  pulposus), cervical 06/05/2013    Class: Diagnosis of  . 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  . Muscle spasms of neck 04/04/2013  . 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 OA (osteoarthritis) of knee 02/22/2013  . Iliotibial band syndrome of left side 11/22/2012  . Localized, primary osteoarthritis of the ankle and foot 10/11/2012  . Difficulty in walking 08/17/2012  . Stiffness of joint, not elsewhere classified, ankle and foot 08/17/2012  . Peroneal tendinitis 08/16/2012  . Shortness of breath 02/09/2012  . Precordial pain 02/09/2012  . Essential hypertension, benign 02/09/2012  . Mixed hyperlipidemia 02/09/2012  . Dysthymia 02/04/2012  . Pain in joint, ankle and foot 12/23/2011  . Abnormality of gait 12/23/2011  . Lateral epicondylitis/tennis elbow 05/06/2011  . Trigger point of extremity 05/06/2011  . TENOSYNOVITIS OF FOOT AND ANKLE 10/22/2010    PT - End of Session Activity Tolerance: Patient tolerated treatment well General Behavior During Therapy: [facility] for tasks assessed/performed  GP    Sohil Timko R 07/20/2014, 8:46 AM

## 2014-07-23 ENCOUNTER — Ambulatory Visit (HOSPITAL_COMMUNITY)
Admission: RE | Admit: 2014-07-23 | Discharge: 2014-07-23 | Disposition: A | Discharge status: Home / Self Care | Payer: Medicare Other | Source: Ambulatory Visit | Attending: Internal Medicine | Admitting: Internal Medicine

## 2014-07-23 DIAGNOSIS — IMO0001 Reserved for inherently not codable concepts without codable children: Secondary | ICD-10-CM | POA: Diagnosis not present

## 2014-07-23 NOTE — Progress Notes (Signed)
Physical Therapy Treatment Patient Details  Name: ARDEAN MELROY MRN: [id number] Date of Birth: [birth year]  Today's Date: 07/23/2014 Time: 0803-0845 PT Time Calculation (min): 42 min  Visit#: 6 of 12  Re-eval: 08/05/14 Authorization: Medicare  Authorization Visit#: 6 of 12  Charges:  therex [phone] (25'), manual [phone] (16')  Subjective: Symptoms/Limitations Symptoms: Currently pain free this morning, reports completeing a lot of house and yardwork as well stretches and exercises Pain Assessment Currently in Pain?: No/denies   Exercise/Treatments Stretches Active Hamstring Stretch: 3 reps;Limitations;20 seconds Active Hamstring Stretch Limitations: 3 ways Hip Flexor Stretch: 2 reps;20 seconds;Limitations Hip Flexor Stretch Limitations: 3 ways Gastroc Stretch: Limitations;3 reps;20 seconds Gastroc Stretch Limitations: 3way Soleus Stretch: Limitations;20 seconds;3 reps Soleus Stretch Limitations: 3 way Standing Heel Raises: 10 reps;Limitations Heel Raises Limitations: toeraisee 10 reps Other Standing Knee Exercises: 3D ankle excursion 10x   Manual Therapy Manual Therapy: Other (comment) Other Manual Therapy: talocrural, subtalar, midtarsal, and great toe grade 2-3 joint mobilizations Lt foot  Physical Therapy Assessment and Plan PT Assessment and Plan Clinical Impression Statement: Improved mobility of ankles noted today with activities.  Less tightness in foot today with manual techniques.  REsponding well to therapy.   PT Plan: Continue mobility exercises next session to further progress mobility.  Continue manual techniques.      Problem List Patient Active Problem List   Diagnosis Date Noted  . HNP (herniated nucleus pulposus), cervical 06/05/2013    Class: Diagnosis of  . 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  . Muscle spasms of neck 04/04/2013  . 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 OA (osteoarthritis) of knee 02/22/2013  . Iliotibial band syndrome  of left side 11/22/2012  . Localized, primary osteoarthritis of the ankle and foot 10/11/2012  . Difficulty in walking 08/17/2012  . Stiffness of joint, not elsewhere classified, ankle and foot 08/17/2012  . Peroneal tendinitis 08/16/2012  . Shortness of breath 02/09/2012  . Precordial pain 02/09/2012  . Essential hypertension, benign 02/09/2012  . Mixed hyperlipidemia 02/09/2012  . Dysthymia 02/04/2012  . Pain in joint, ankle and foot 12/23/2011  . Abnormality of gait 12/23/2011  . Lateral epicondylitis/tennis elbow 05/06/2011  . Trigger point of extremity 05/06/2011  . TENOSYNOVITIS OF FOOT AND ANKLE 10/22/2010    PT - End of Session Activity Tolerance: Patient tolerated treatment well General Behavior During Therapy: [facility] for tasks assessed/performed  GP    Teena Irani, PTA/CLT 07/23/2014, 8:48 AM

## 2014-07-25 ENCOUNTER — Ambulatory Visit (HOSPITAL_COMMUNITY)
Admission: RE | Admit: 2014-07-25 | Discharge: 2014-07-25 | Disposition: A | Discharge status: Home / Self Care | Payer: Medicare Other | Source: Ambulatory Visit | Attending: Internal Medicine | Admitting: Internal Medicine

## 2014-07-25 DIAGNOSIS — IMO0001 Reserved for inherently not codable concepts without codable children: Secondary | ICD-10-CM | POA: Diagnosis not present

## 2014-07-25 NOTE — Progress Notes (Signed)
Physical Therapy Treatment Patient Details  Name: Zachary [name] MRN: [id number] Date of Birth: [birth year]  Today's Date: 07/25/2014 Time: 1751-0258 PT Time Calculation (min): 40 min   CHarges: Manual [id number], TherEx [phone] Visit#: 7 of 12  Re-eval: 08/05/14 Assessment Diagnosis: Lt foot stiffness secondary to pain and aquired cavorvaus deformity of Lt foot.  Next MD Visit: Doran Durand 3 months Prior Therapy: no  Authorization: Medicare  Authorization Time Period:    Authorization Visit#: 7 of 12   Subjective: Symptoms/Limitations Symptoms: Patient notes pain is a 3/10 noting that he worked his foot/ankle really hard yesterday after gettign new orthotics during which he had no pain.  Pain Assessment Currently in Pain?: Yes Pain Score: 3  Pain Location: Ankle Pain Orientation: Left Pain Type: Chronic pain  Exercise/Treatments Stretches Active Hamstring Stretch: Limitations;20 seconds;1 rep Active Hamstring Stretch Limitations: 3 ways Piriformis Stretch: 3 reps;20 seconds Gastroc Stretch: Limitations;3 reps;20 seconds Gastroc Stretch Limitations:  with plantar fascia stretch.  Soleus Stretch: Limitations;20 seconds;3 reps Soleus Stretch Limitations: 3 way Standing Heel Raises: 10 reps;Limitations Heel Raises Limitations: toeraisee 10 reps Rocker Board: Limitations Rocker Board Limitations: 20x AP, LT-Rt each  SLS: Single leg balance reach common with slider and cane 5x Other Standing Knee Exercises: 3D ankle excursion 10x  Manual Therapy Other Manual Therapy: talocrural, subtalar, midtarsal, and great toe grade 2-3 joint mobilizations Lt foot   Physical Therapy Assessment and Plan PT Assessment and Plan Clinical Impression Statement: Patient's ankle displays improvign ROM though still limited. this session continued current focus on increasing anlkie, foot and toe mobility. Pqatient responded well with less pain.  PT Plan: Continue mobility exercises next session to  further progress mobility.  Continue manual techniques.     Goals PT Short Term Goals PT Short Term Goal 1: Patient will increase ankle dorsiflexion to >15 degrees to decrease early heel off during walking PT Short Term Goal 1 - Progress: Progressing toward goal PT Short Term Goal 2: Patient will icrease ankle plantar flexion to 40 degrees to be able to stand on tip of toes to reach to high kitchen shelf PT Short Term Goal 2 - Progress: Progressing toward goal PT Short Term Goal 3: Patient will increase knee extension to 0 degrees to be able to walk with a longer stride length PT Short Term Goal 3 - Progress: Progressing toward goal PT Short Term Goal 4: Patint will increase hip extension to ambulate with increased stride length PT Short Term Goal 4 - Progress: Progressing toward goal PT Short Term Goal 5: Patient will increase ankle inversion/eversion to 30/15 degrees to improve normal ankle AROM PT Short Term Goal 5 - Progress: Progressing toward goal PT Long Term Goals PT Long Term Goal 1: Patient will increase great toe dorsiflextion to 40 degree to decrease early toe off durign gait PT Long Term Goal 1 - Progress: Progressing toward goal PT Long Term Goal 2: Patient will increase hip internal rotation to 30 degrees to increase ability to absorb impact of walking PT Long Term Goal 2 - Progress: Progressing toward goal Long Term Goal 3: Patient will be able to perform sit to stand 5x in less than 15seconds without UE support indicatign improved hip strength and patient not at high risk of falls.  Long Term Goal 3 Progress: Progressing toward goal Long Term Goal 4: Patient will be able to walk 30 minutes with pain less than 3/10 Long Term Goal 4 Progress: Progressing toward goal  Problem List Patient Active Problem List  Diagnosis Date Noted  . HNP (herniated nucleus pulposus), cervical 06/05/2013    Class: Diagnosis of  . 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  . Muscle spasms of neck 04/04/2013   . 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 OA (osteoarthritis) of knee 02/22/2013  . Iliotibial band syndrome of left side 11/22/2012  . Localized, primary osteoarthritis of the ankle and foot 10/11/2012  . Difficulty in walking 08/17/2012  . Stiffness of joint, not elsewhere classified, ankle and foot 08/17/2012  . Peroneal tendinitis 08/16/2012  . Shortness of breath 02/09/2012  . Precordial pain 02/09/2012  . Essential hypertension, benign 02/09/2012  . Mixed hyperlipidemia 02/09/2012  . Dysthymia 02/04/2012  . Pain in joint, ankle and foot 12/23/2011  . Abnormality of gait 12/23/2011  . Lateral epicondylitis/tennis elbow 05/06/2011  . Trigger point of extremity 05/06/2011  . TENOSYNOVITIS OF FOOT AND ANKLE 10/22/2010    PT - End of Session Activity Tolerance: Patient tolerated treatment well General Behavior During Therapy: [facility] for tasks assessed/performed  GP    [name] R 07/25/2014, 8:39 AM

## 2014-07-27 ENCOUNTER — Ambulatory Visit (HOSPITAL_COMMUNITY)
Admission: RE | Admit: 2014-07-27 | Discharge: 2014-07-27 | Disposition: A | Discharge status: Home / Self Care | Payer: Medicare Other | Source: Ambulatory Visit | Attending: Internal Medicine | Admitting: Internal Medicine

## 2014-07-27 DIAGNOSIS — IMO0001 Reserved for inherently not codable concepts without codable children: Secondary | ICD-10-CM | POA: Diagnosis not present

## 2014-07-27 NOTE — Progress Notes (Signed)
Physical Therapy Treatment Patient Details  Name: Zachary [name] MRN: [id number] Date of Birth: 1946/02/01  Today's Date: 07/27/2014 Time: 0802-0845 PT Time Calculation (min): 43 min   Charges: Manual [id number], TherEx [phone] Visit#: 8 of 12  Re-eval: 08/05/14 Assessment Diagnosis: Lt foot stiffness secondary to pain and aquired cavorvaus deformity of Lt foot.  Next MD Visit: Doran Durand 3 months Prior Therapy: no  Authorization: Medicare  Authorization Time Period:    Authorization Visit#: 8 of 12   Subjective: Symptoms/Limitations Symptoms: Patient notes increased pain today stating that "the week has caught up with me, I have been really busy and am really tired today and think that he has just gotten very sore from everything outside of therapy. Pain Assessment Currently in Pain?: Yes Pain Score: 5  Pain Location: Ankle Pain Orientation: Left Pain Type: Chronic pain  Exercise/Treatments Stretches Active Hamstring Stretch: Limitations;20 seconds;1 rep Active Hamstring Stretch Limitations: 3 ways Hip Flexor Stretch: 2 reps;20 seconds;Limitations Hip Flexor Stretch Limitations: 3 ways ITB Stretch: 3 reps;20 seconds Piriformis Stretch: 3 reps;20 seconds;Limitations Gastroc Stretch: Limitations;3 reps;20 seconds Gastroc Stretch Limitations: 3way with towel under toes 10x 3 seconds Soleus Stretch: Limitations;20 seconds;3 reps Soleus Stretch Limitations: 3 way 10x 3 seconds with towel under toes Standing Heel Raises: 10 reps;Limitations Heel Raises Limitations: toeraisee 10 reps Rocker Board: Limitations Rocker Board Limitations: 20x AP, LT-Rt each and wobble board 10x CW/CCW SLS: Single leg balance reach common with slider and cane 5x Other Standing Knee Exercises: 3D ankle excursion 10x  Manual Therapy Joint Mobilization: talocrural, subtalar, midtarsal, and great toe grade 2-3 joint mobilizations Lt foot, cuboid whip  Physical Therapy Assessment and Plan PT Assessment  and Plan Clinical Impression Statement: Despite initial increased pain and soreness secondary to busy wok week patient displays improving ROM and notes no pain following manual therapy. Mobility exercises were progressed this session with no increase in pain and patient noting them "hitting the spot."  PT Plan: Continue mobility exercises next session to further progress mobility.  Continue manual techniques for pain relief and imptoving joint mobility.     Goals PT Short Term Goals PT Short Term Goal 1: Patient will increase ankle dorsiflexion to >15 degrees to decrease early heel off during walking PT Short Term Goal 1 - Progress: Progressing toward goal PT Short Term Goal 2: Patient will icrease ankle plantar flexion to 40 degrees to be able to stand on tip of toes to reach to high kitchen shelf PT Short Term Goal 2 - Progress: Progressing toward goal PT Short Term Goal 3: Patient will increase knee extension to 0 degrees to be able to walk with a longer stride length PT Short Term Goal 3 - Progress: Progressing toward goal PT Short Term Goal 4: Patint will increase hip extension to ambulate with increased stride length PT Short Term Goal 4 - Progress: Progressing toward goal PT Short Term Goal 5: Patient will increase ankle inversion/eversion to 30/15 degrees to improve normal ankle AROM PT Short Term Goal 5 - Progress: Progressing toward goal  Problem List Patient Active Problem List   Diagnosis Date Noted  . HNP (herniated nucleus pulposus), cervical 06/05/2013    Class: Diagnosis of  . 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  . Muscle spasms of neck 04/04/2013  . 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 OA (osteoarthritis) of knee 02/22/2013  . Iliotibial band syndrome of left side 11/22/2012  . Localized, primary osteoarthritis of the ankle and foot 10/11/2012  . Difficulty in walking  08/17/2012  . Stiffness of joint, not elsewhere classified, ankle and foot 08/17/2012  .  Peroneal tendinitis 08/16/2012  . Shortness of breath 02/09/2012  . Precordial pain 02/09/2012  . Essential hypertension, benign 02/09/2012  . Mixed hyperlipidemia 02/09/2012  . Dysthymia 02/04/2012  . Pain in joint, ankle and foot 12/23/2011  . Abnormality of gait 12/23/2011  . Lateral epicondylitis/tennis elbow 05/06/2011  . Trigger point of extremity 05/06/2011  . TENOSYNOVITIS OF FOOT AND ANKLE 10/22/2010    PT - End of Session Activity Tolerance: Patient tolerated treatment well General Behavior During Therapy: [facility] for tasks assessed/performed  GP    Jamarie Mussa R 07/27/2014, 8:40 AM

## 2014-07-30 ENCOUNTER — Ambulatory Visit (HOSPITAL_COMMUNITY)
Admission: RE | Admit: 2014-07-30 | Discharge: 2014-07-30 | Disposition: A | Discharge status: Home / Self Care | Payer: Medicare Other | Source: Ambulatory Visit | Attending: Orthopedic Surgery | Admitting: Orthopedic Surgery

## 2014-07-30 DIAGNOSIS — M25659 Stiffness of unspecified hip, not elsewhere classified: Secondary | ICD-10-CM | POA: Insufficient documentation

## 2014-07-30 DIAGNOSIS — E782 Mixed hyperlipidemia: Secondary | ICD-10-CM | POA: Insufficient documentation

## 2014-07-30 DIAGNOSIS — M25673 Stiffness of unspecified ankle, not elsewhere classified: Secondary | ICD-10-CM | POA: Diagnosis not present

## 2014-07-30 DIAGNOSIS — M25559 Pain in unspecified hip: Secondary | ICD-10-CM | POA: Insufficient documentation

## 2014-07-30 DIAGNOSIS — I10 Essential (primary) hypertension: Secondary | ICD-10-CM | POA: Diagnosis not present

## 2014-07-30 DIAGNOSIS — M25579 Pain in unspecified ankle and joints of unspecified foot: Secondary | ICD-10-CM | POA: Diagnosis not present

## 2014-07-30 DIAGNOSIS — R269 Unspecified abnormalities of gait and mobility: Secondary | ICD-10-CM | POA: Insufficient documentation

## 2014-07-30 DIAGNOSIS — IMO0001 Reserved for inherently not codable concepts without codable children: Secondary | ICD-10-CM | POA: Diagnosis not present

## 2014-07-30 DIAGNOSIS — M25676 Stiffness of unspecified foot, not elsewhere classified: Secondary | ICD-10-CM | POA: Diagnosis not present

## 2014-07-30 NOTE — Progress Notes (Signed)
Physical Therapy Treatment Patient Details  Name: Zachary [name] MRN: [id number] Date of Birth: 04-12-1946  Today's Date: 07/30/2014 Time: 4854-6270 PT Time Calculation (min): 50 min Charge: TE [id number], Manual [id number]  Visit#: 9 of 17  Re-eval: 08/05/14 Assessment Diagnosis: Lt foot stiffness secondary to pain and aquired cavorvaus deformity of Lt foot.  Next MD Visit: Doran Durand 3 months Prior Therapy: no  Authorization: Medicare  Authorization Time Period:    Authorization Visit#: 9 of 12   Subjective: Symptoms/Limitations Symptoms: Pain free today, was on football field until 3am Saturday for midnight madness Pain Assessment Currently in Pain?: No/denies  Objective:   Exercise/Treatments Stretches Active Hamstring Stretch: 3 reps;30 seconds;Limitations Active Hamstring Stretch Limitations: 3 ways Hip Flexor Stretch: 3 reps;20 seconds;Limitations Hip Flexor Stretch Limitations: 3 ways ITB Stretch: 3 reps;20 seconds Piriformis Stretch: 3 reps;20 seconds;Limitations Gastroc Stretch: Limitations;3 reps;20 seconds Gastroc Stretch Limitations: 3way with towel under toes 10x 3 seconds Soleus Stretch: Limitations;20 seconds;3 reps Soleus Stretch Limitations: 3 way 10x 3 seconds with towel under toes Standing Heel Raises: 15 reps;5 seconds;Limitations Heel Raises Limitations: toeraisee 15 reps with eccentric control descending Rocker Board: Limitations Rocker Board Limitations: 20x AP, LT-Rt each and wobble board 10x CW/CCW SLS: Single leg balance reach common with slider and cane 5x Other Standing Knee Exercises: 3D ankle excursion 10x   Manual Therapy Manual Therapy: Joint mobilization Other Manual Therapy: talocrural, subtalar, midtarsal, and great toe grade 2-3 joint mobilizations Lt foot   Physical Therapy Assessment and Plan PT Assessment and Plan Clinical Impression Statement: Session focus on improving ankle mobility with ankle and LE stretches, mobilty  exercises and manual techniques to improve ROM with joint mobs and to release fascial restrictions.  No reports of pain through session.  Noted improvements with eversion directions with improved gait mechanics.   PT Plan: Continue mobility exercises next session to further progress mobility.  Continue manual techniques for pain relief and imptoving joint mobility. Gcode due next session.    Goals PT Short Term Goals PT Short Term Goal 1: Patient will increase ankle dorsiflexion to >15 degrees to decrease early heel off during walking PT Short Term Goal 1 - Progress: Progressing toward goal PT Short Term Goal 2: Patient will icrease ankle plantar flexion to 40 degrees to be able to stand on tip of toes to reach to high kitchen shelf PT Short Term Goal 2 - Progress: Progressing toward goal PT Short Term Goal 3: Patient will increase knee extension to 0 degrees to be able to walk with a longer stride length PT Short Term Goal 3 - Progress: Progressing toward goal PT Short Term Goal 4: Patint will increase hip extension to ambulate with increased stride length PT Short Term Goal 4 - Progress: Progressing toward goal PT Short Term Goal 5: Patient will increase ankle inversion/eversion to 30/15 degrees to improve normal ankle AROM PT Long Term Goals PT Long Term Goal 1: Patient will increase great toe dorsiflextion to 40 degree to decrease early toe off durign gait PT Long Term Goal 2: Patient will increase hip internal rotation to 30 degrees to increase ability to absorb impact of walking Long Term Goal 3: Patient will be able to perform sit to stand 5x in less than 15seconds without UE support indicatign improved hip strength and patient not at high risk of falls.  Long Term Goal 4: Patient will be able to walk 30 minutes with pain less than 3/10  Problem List Patient Active Problem List   Diagnosis  Date Noted  . HNP (herniated nucleus pulposus), cervical 06/05/2013    Class: Diagnosis of  .  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  . Muscle spasms of neck 04/04/2013  . 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 OA (osteoarthritis) of knee 02/22/2013  . Iliotibial band syndrome of left side 11/22/2012  . Localized, primary osteoarthritis of the ankle and foot 10/11/2012  . Difficulty in walking 08/17/2012  . Stiffness of joint, not elsewhere classified, ankle and foot 08/17/2012  . Peroneal tendinitis 08/16/2012  . Shortness of breath 02/09/2012  . Precordial pain 02/09/2012  . Essential hypertension, benign 02/09/2012  . Mixed hyperlipidemia 02/09/2012  . Dysthymia 02/04/2012  . Pain in joint, ankle and foot 12/23/2011  . Abnormality of gait 12/23/2011  . Lateral epicondylitis/tennis elbow 05/06/2011  . Trigger point of extremity 05/06/2011  . TENOSYNOVITIS OF FOOT AND ANKLE 10/22/2010    PT - End of Session Activity Tolerance: Patient tolerated treatment well General Behavior During Therapy: [facility] for tasks assessed/performed  GP    Aldona Lento 07/30/2014, 10:03 AM

## 2014-08-01 ENCOUNTER — Ambulatory Visit (HOSPITAL_COMMUNITY)
Admission: RE | Admit: 2014-08-01 | Discharge: 2014-08-01 | Disposition: A | Discharge status: Home / Self Care | Payer: Medicare Other | Source: Ambulatory Visit | Attending: Orthopedic Surgery | Admitting: Orthopedic Surgery

## 2014-08-01 DIAGNOSIS — IMO0001 Reserved for inherently not codable concepts without codable children: Secondary | ICD-10-CM | POA: Diagnosis not present

## 2014-08-01 NOTE — Progress Notes (Addendum)
Physical Therapy Treatment Patient Details  Name: YARON GRASSE MRN: [id number] Date of Birth: Dec 05, 1946  Today's Date: 08/01/2014 Time: 8341-9622 PT Time Calculation (min): 57 min Charge: TE [id number], Manual [id number]   Visit#: 10 of 17  Re-eval: 08/05/14 Assessment Diagnosis: Lt foot stiffness secondary to pain and aquired cavorvaus deformity of Lt foot.  Next MD Visit: Doran Durand 3 months Prior Therapy: no  Authorization: Medicare  Authorization Time Period:    Authorization Visit#: 10 of 12   Subjective: Symptoms/Limitations Symptoms: Pt stated Lt ankle pain scale 4/10 today.   Pain Assessment Currently in Pain?: Yes Pain Score: 4  Pain Location: Ankle Pain Orientation: Left  Objective:   Exercise/Treatments Stretches Active Hamstring Stretch: 3 reps;30 seconds;Limitations Active Hamstring Stretch Limitations: 3 ways Hip Flexor Stretch: 3 reps;20 seconds;Limitations Hip Flexor Stretch Limitations: 3 ways ITB Stretch: 3 reps;20 seconds Gastroc Stretch: 3 reps;30 seconds;Limitations Gastroc Stretch Limitations: 3way with towel under toes 10x 3 seconds Soleus Stretch: Limitations;20 seconds;3 reps Soleus Stretch Limitations: 3 way 10x 3 seconds with towel under toes Standing Heel Raises: 15 reps;5 seconds;Limitations Heel Raises Limitations: toeraisee 15 reps with eccentric control descending Rocker Board: 2 minutes;Limitations Rocker Board Limitations: 64min AP, LT-Rt each and wobble board 10x CW/CCW SLS: Single leg balance reach common and uncommon with slider and cane 5x Other Standing Knee Exercises: 3D ankle excursion 10x; 3D hip excursion 10x Manual Therapy Manual Therapy: Joint mobilization Joint Mobilization: talocrural, subtalar, midtarsal, and great toe grade 2-3 joint mobilizations Lt foot, cuboid  Physical Therapy Assessment and Plan PT Assessment and Plan Clinical Impression Statement: Continued sessoin focus on ankle mobility with stretches and  mobilty exercises.  Added 3D hip excursion to imprve hip mobility with gait.  Continued with single leg slide reach, added uncommon to POC to improve ankle mobility.  Manual techniques complete to reduce fascial restrictions and improve AROM.  Pt reported pain resolved with improve gait mechanics at end of session.  FOTO complete this session, pt with improver perceived functional abilities wtih increase score.   PT Plan: Continue mobility exercises next session to further progress mobility.  Continue manual techniques for pain relief and imptoving joint mobility    Goals PT Short Term Goals PT Short Term Goal 1: Patient will increase ankle dorsiflexion to >15 degrees to decrease early heel off during walking PT Short Term Goal 1 - Progress: Progressing toward goal PT Short Term Goal 2: Patient will icrease ankle plantar flexion to 40 degrees to be able to stand on tip of toes to reach to high kitchen shelf PT Short Term Goal 2 - Progress: Progressing toward goal PT Short Term Goal 3: Patient will increase knee extension to 0 degrees to be able to walk with a longer stride length PT Short Term Goal 3 - Progress: Progressing toward goal PT Short Term Goal 4: Patint will increase hip extension to ambulate with increased stride length PT Short Term Goal 4 - Progress: Progressing toward goal PT Short Term Goal 5: Patient will increase ankle inversion/eversion to 30/15 degrees to improve normal ankle AROM PT Short Term Goal 5 - Progress: Progressing toward goal PT Long Term Goals PT Long Term Goal 1: Patient will increase great toe dorsiflextion to 40 degree to decrease early toe off durign gait PT Long Term Goal 2: Patient will increase hip internal rotation to 30 degrees to increase ability to absorb impact of walking PT Long Term Goal 2 - Progress: Progressing toward goal Long Term Goal 3: Patient will be  able to perform sit to stand 5x in less than 15seconds without UE support indicatign improved  hip strength and patient not at high risk of falls.  Long Term Goal 4: Patient will be able to walk 30 minutes with pain less than 3/10 Long Term Goal 4 Progress: Progressing toward goal  Problem List Patient Active Problem List   Diagnosis Date Noted  . HNP (herniated nucleus pulposus), cervical 06/05/2013    Class: Diagnosis of  . 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  . Muscle spasms of neck 04/04/2013  . 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 OA (osteoarthritis) of knee 02/22/2013  . Iliotibial band syndrome of left side 11/22/2012  . Localized, primary osteoarthritis of the ankle and foot 10/11/2012  . Difficulty in walking 08/17/2012  . Stiffness of joint, not elsewhere classified, ankle and foot 08/17/2012  . Peroneal tendinitis 08/16/2012  . Shortness of breath 02/09/2012  . Precordial pain 02/09/2012  . Essential hypertension, benign 02/09/2012  . Mixed hyperlipidemia 02/09/2012  . Dysthymia 02/04/2012  . Pain in joint, ankle and foot 12/23/2011  . Abnormality of gait 12/23/2011  . Lateral epicondylitis/tennis elbow 05/06/2011  . Trigger point of extremity 05/06/2011  . TENOSYNOVITIS OF FOOT AND ANKLE 10/22/2010    PT - End of Session Activity Tolerance: Patient tolerated treatment well General Behavior During Therapy: [facility] for tasks assessed/performed  GP Functional Assessment Tool Used: FOTO 52% limitation (was 54% limited)  Aldona Lento 08/01/2014, 10:34 AM

## 2014-08-13 ENCOUNTER — Ambulatory Visit (HOSPITAL_COMMUNITY)
Admission: RE | Admit: 2014-08-13 | Discharge: 2014-08-13 | Disposition: A | Discharge status: Home / Self Care | Payer: Medicare Other | Source: Ambulatory Visit | Attending: Orthopedic Surgery | Admitting: Orthopedic Surgery

## 2014-08-13 DIAGNOSIS — IMO0001 Reserved for inherently not codable concepts without codable children: Secondary | ICD-10-CM | POA: Diagnosis not present

## 2014-08-13 NOTE — Progress Notes (Signed)
Physical Therapy Re-evaluation/Treatment Note  Patient Details  Name: Zachary [name] MRN: [id number] Date of Birth: 1946-05-22  Today's Date: 08/13/2014 Time: 7588-3254 PT Time Calculation (min): 47 min Charge: TE 9826-4158, ROM/MMT 3094-0768              Visit#: 11 of 23  Re-eval: 09/10/14 Assessment Diagnosis: Lt foot stiffness secondary to pain and aquired cavorvaus deformity of Lt foot.  Next MD Visit: Doran Durand 3 months Prior Therapy: no  Authorization: Medicare    Authorization Time Period: Gcode complete 10th visit  Authorization Visit#: 11 of 20   Subjective Symptoms/Limitations Symptoms: Pt stated last Tuesday he had horibble tooth ache, went to dentist wtih abscess wsdom tooth.  Has not been doing a lot of exercises and has been on antibiotics and taking IBprofen.  Mind hasnt even thought of ankle this past week.  How long can you stand comfortably?: Able to stand for 5-6 minutes (was 0 tolerance) How long can you walk comfortably?: Able to walk for 10-12 minutes (was 0 tolerance) Pain Assessment Currently in Pain?: No/denies Pain Score: 4  Pain Location: Ankle Pain Orientation: Left  Objective:   Assessment LLE AROM (degrees) Left Hip Extension: 10 (was 0) Left Hip Flexion: 106 (was 100) Left Hip External Rotation : 45 (was 44) Left Hip Internal Rotation : 26 (was 5) Left Knee Extension: 8 (was 10) Left Knee Flexion: 113 (was 100) Left Ankle Dorsiflexion: 8 (was 6) Left Ankle Plantar Flexion: 34 (was 22) Left Ankle Inversion: 34 (was 22) Left Ankle Eversion: 28 (was 10) LLE Strength Left Hip Flexion: 4/5 Left Hip Extension: 4/5 Left Hip ABduction: 4/5 Left Knee Flexion: 4/5 Left Knee Extension:  (4+/5 ) Left Ankle Dorsiflexion: 3+/5 Left Ankle Plantar Flexion: 4/5 Left Ankle Inversion: 4/5 Left Ankle Eversion: 3+/5  Exercise/Treatments Stretches Active Hamstring Stretch: 3 reps;30 seconds;Limitations Active Hamstring Stretch Limitations: 3 ways Hip  Flexor Stretch: 3 reps;20 seconds;Limitations Hip Flexor Stretch Limitations: 3 ways Gastroc Stretch: 3 reps;30 seconds;Limitations Gastroc Stretch Limitations: slant board Standing Heel Raises: 15 reps;5 seconds;Limitations Heel Raises Limitations: toeraisee 15 reps with eccentric control descending Other Standing Knee Exercises: 3D ankle excursion 10x; 3D hip excursion 10x    Physical Therapy Assessment and Plan PT Assessment and Plan Clinical Impression Statement: Reassessment complete with the following findings:  LE mobilty with improved AROM for ankle, hip and knee.  Pt reported pain free today, pt has wisdom tooth abscess last week so is currently on pain medicated for tooth that may be accountable for pain free today.  Pt continues to ambulate with restricted hip and knee extension.  Pt will benefit from skilled intervention to improve AROM to improve gait and reduce pain.   PT Treatment/Interventions: Gait training;Stair training;Functional mobility training;Therapeutic activities;Therapeutic exercise;Balance training;Manual techniques;Modalities;Patient/family education PT Plan: Recommend continuing OPPT for 4 more weeks (3x a week) to improve remaining deficits.      Goals PT Short Term Goals PT Short Term Goal 1: Patient will increase ankle dorsiflexion to >15 degrees to decrease early heel off during walking (8 degrees) PT Short Term Goal 1 - Progress: Progressing toward goal PT Short Term Goal 2: Patient will icrease ankle plantar flexion to 40 degrees to be able to stand on tip of toes to reach to high kitchen shelf (34 degrees) PT Short Term Goal 2 - Progress: Progressing toward goal PT Short Term Goal 3: Patient will increase knee extension to 0 degrees to be able to walk with a longer stride length PT Short  Term Goal 3 - Progress: Progressing toward goal PT Short Term Goal 4: Patint will increase hip extension to ambulate with increased stride length PT Short Term Goal 4 -  Progress: Progressing toward goal PT Short Term Goal 5: Patient will increase ankle inversion/eversion to 30/15 degrees to improve normal ankle AROM PT Short Term Goal 5 - Progress: Progressing toward goal PT Long Term Goals PT Long Term Goal 1: Patient will increase great toe dorsiflextion to 40 degree to decrease early toe off durign gait PT Long Term Goal 1 - Progress: Progressing toward goal PT Long Term Goal 2: Patient will increase hip internal rotation to 30 degrees to increase ability to absorb impact of walking PT Long Term Goal 2 - Progress: Progressing toward goal Long Term Goal 3: Patient will be able to perform sit to stand 5x in less than 15seconds without UE support indicatign improved hip strength and patient not at high risk of falls.  (10 seconds) Long Term Goal 3 Progress: Met Long Term Goal 4: Patient will be able to walk 30 minutes with pain less than 3/10 Long Term Goal 4 Progress: Progressing toward goal  Problem List Patient Active Problem List   Diagnosis Date Noted  . HNP (herniated nucleus pulposus), cervical 06/05/2013    Class: Diagnosis of  . 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  . Muscle spasms of neck 04/04/2013  . 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 OA (osteoarthritis) of knee 02/22/2013  . Iliotibial band syndrome of left side 11/22/2012  . Localized, primary osteoarthritis of the ankle and foot 10/11/2012  . Difficulty in walking 08/17/2012  . Stiffness of joint, not elsewhere classified, ankle and foot 08/17/2012  . Peroneal tendinitis 08/16/2012  . Shortness of breath 02/09/2012  . Precordial pain 02/09/2012  . Essential hypertension, benign 02/09/2012  . Mixed hyperlipidemia 02/09/2012  . Dysthymia 02/04/2012  . Pain in joint, ankle and foot 12/23/2011  . Abnormality of gait 12/23/2011  . Lateral epicondylitis/tennis elbow 05/06/2011  . Trigger point of extremity 05/06/2011  . TENOSYNOVITIS OF FOOT AND ANKLE 10/22/2010    PT -  End of Session Activity Tolerance: Patient tolerated treatment well General Behavior During Therapy: WFL for tasks assessed/performed  GP Functional Assessment Tool Used: FOTO 52% limitation (was 54% limited) Functional Limitation: Mobility: Walking and moving around Mobility: Walking and Moving Around Current Status (Y1017): At least 40 percent but less than 60 percent impaired, limited or restricted Mobility: Walking and Moving Around Goal Status [id number]): At least 20 percent but less than 40 percent impaired, limited or restricted  Aldona Lento 08/13/2014, 12:08 PM  Physician Documentation Your signature is required to indicate approval of the treatment plan as stated above.  Please sign and either send electronically or make a copy of this report for your files and return this physician signed original.   Please mark one 1.__approve of plan  2. ___approve of plan with the following conditions.   ______________________________                                                          _____________________ Physician Signature  Date  

## 2014-08-14 NOTE — Progress Notes (Signed)
Physical Therapy Treatment Patient Details  Name: Zachary [name] MRN: [id number] Date of Birth: 06/05/1946  Today's Date: 08/01/2014 Time: 6606-3016 PT Time Calculation (min): 72 min Charge: TE [id number], Manual [id number]   Visit#: 10 of 17  Re-eval: 08/05/14 Assessment Diagnosis: Lt foot stiffness secondary to pain and aquired cavorvaus deformity of Lt foot.  Next MD Visit: Doran Durand 3 months Prior Therapy: no  Authorization: Medicare  Authorization Time Period:    Authorization Visit#: 10 of 12   Subjective: Symptoms/Limitations Symptoms: Pt stated Lt ankle pain scale 4/10 today.   Pain Assessment Currently in Pain?: Yes Pain Score: 4  Pain Location: Ankle Pain Orientation: Left  Objective:   Exercise/Treatments Stretches Active Hamstring Stretch: 3 reps;30 seconds;Limitations Active Hamstring Stretch Limitations: 3 ways Hip Flexor Stretch: 3 reps;20 seconds;Limitations Hip Flexor Stretch Limitations: 3 ways ITB Stretch: 3 reps;20 seconds Gastroc Stretch: 3 reps;30 seconds;Limitations Gastroc Stretch Limitations: 3way with towel under toes 10x 3 seconds Soleus Stretch: Limitations;20 seconds;3 reps Soleus Stretch Limitations: 3 way 10x 3 seconds with towel under toes Standing Heel Raises: 15 reps;5 seconds;Limitations Heel Raises Limitations: toeraisee 15 reps with eccentric control descending Rocker Board: 2 minutes;Limitations Rocker Board Limitations: 78min AP, LT-Rt each and wobble board 10x CW/CCW SLS: Single leg balance reach common and uncommon with slider and cane 5x Other Standing Knee Exercises: 3D ankle excursion 10x; 3D hip excursion 10x Manual Therapy Manual Therapy: Joint mobilization Joint Mobilization: talocrural, subtalar, midtarsal, and great toe grade 2-3 joint mobilizations Lt foot, cuboid  Physical Therapy Assessment and Plan PT Assessment and Plan Clinical Impression Statement: Continued sessoin focus on ankle mobility with stretches and  mobilty exercises.  Added 3D hip excursion to imprve hip mobility with gait.  Continued with single leg slide reach, added uncommon to POC to improve ankle mobility.  Manual techniques complete to reduce fascial restrictions and improve AROM.  Pt reported pain resolved with improve gait mechanics at end of session.  FOTO complete this session, pt with improver perceived functional abilities wtih increase score.   PT Plan: Continue mobility exercises next session to further progress mobility.  Continue manual techniques for pain relief and imptoving joint mobility    Goals PT Short Term Goals PT Short Term Goal 1: Patient will increase ankle dorsiflexion to >15 degrees to decrease early heel off during walking PT Short Term Goal 1 - Progress: Progressing toward goal PT Short Term Goal 2: Patient will icrease ankle plantar flexion to 40 degrees to be able to stand on tip of toes to reach to high kitchen shelf PT Short Term Goal 2 - Progress: Progressing toward goal PT Short Term Goal 3: Patient will increase knee extension to 0 degrees to be able to walk with a longer stride length PT Short Term Goal 3 - Progress: Progressing toward goal PT Short Term Goal 4: Patint will increase hip extension to ambulate with increased stride length PT Short Term Goal 4 - Progress: Progressing toward goal PT Short Term Goal 5: Patient will increase ankle inversion/eversion to 30/15 degrees to improve normal ankle AROM PT Short Term Goal 5 - Progress: Progressing toward goal PT Long Term Goals PT Long Term Goal 1: Patient will increase great toe dorsiflextion to 40 degree to decrease early toe off durign gait PT Long Term Goal 2: Patient will increase hip internal rotation to 30 degrees to increase ability to absorb impact of walking PT Long Term Goal 2 - Progress: Progressing toward goal Long Term Goal 3: Patient will be  able to perform sit to stand 5x in less than 15seconds without UE support indicatign improved  hip strength and patient not at high risk of falls.  Long Term Goal 4: Patient will be able to walk 30 minutes with pain less than 3/10 Long Term Goal 4 Progress: Progressing toward goal  Problem List Patient Active Problem List   Diagnosis Date Noted  . HNP (herniated nucleus pulposus), cervical 06/05/2013    Class: Diagnosis of  . 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  . Muscle spasms of neck 04/04/2013  . 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 OA (osteoarthritis) of knee 02/22/2013  . Iliotibial band syndrome of left side 11/22/2012  . Localized, primary osteoarthritis of the ankle and foot 10/11/2012  . Difficulty in walking 08/17/2012  . Stiffness of joint, not elsewhere classified, ankle and foot 08/17/2012  . Peroneal tendinitis 08/16/2012  . Shortness of breath 02/09/2012  . Precordial pain 02/09/2012  . Essential hypertension, benign 02/09/2012  . Mixed hyperlipidemia 02/09/2012  . Dysthymia 02/04/2012  . Pain in joint, ankle and foot 12/23/2011  . Abnormality of gait 12/23/2011  . Lateral epicondylitis/tennis elbow 05/06/2011  . Trigger point of extremity 05/06/2011  . TENOSYNOVITIS OF FOOT AND ANKLE 10/22/2010    PT - End of Session Activity Tolerance: Patient tolerated treatment well General Behavior During Therapy: WFL for tasks assessed/performed  GP Functional Assessment Tool Used: FOTO 52% limitation (was 54% limited)  Aldona Lento 08/01/2014, 10:34 AM

## 2014-08-14 NOTE — Progress Notes (Signed)
Physical Therapy Re-evaluation/Treatment Note  Patient Details  Name: Zachary [name] MRN: [id number] Date of Birth: 1946-05-22  Today's Date: 08/13/2014 Time: 7588-3254 PT Time Calculation (min): 47 min Charge: TE 9826-4158, ROM/MMT 3094-0768              Visit#: 11 of 23  Re-eval: 09/10/14 Assessment Diagnosis: Lt foot stiffness secondary to pain and aquired cavorvaus deformity of Lt foot.  Next MD Visit: Doran Durand 3 months Prior Therapy: no  Authorization: Medicare    Authorization Time Period: Gcode complete 10th visit  Authorization Visit#: 11 of 20   Subjective Symptoms/Limitations Symptoms: Pt stated last Tuesday he had horibble tooth ache, went to dentist wtih abscess wsdom tooth.  Has not been doing a lot of exercises and has been on antibiotics and taking IBprofen.  Mind hasnt even thought of ankle this past week.  How long can you stand comfortably?: Able to stand for 5-6 minutes (was 0 tolerance) How long can you walk comfortably?: Able to walk for 10-12 minutes (was 0 tolerance) Pain Assessment Currently in Pain?: No/denies Pain Score: 4  Pain Location: Ankle Pain Orientation: Left  Objective:   Assessment LLE AROM (degrees) Left Hip Extension: 10 (was 0) Left Hip Flexion: 106 (was 100) Left Hip External Rotation : 45 (was 44) Left Hip Internal Rotation : 26 (was 5) Left Knee Extension: 8 (was 10) Left Knee Flexion: 113 (was 100) Left Ankle Dorsiflexion: 8 (was 6) Left Ankle Plantar Flexion: 34 (was 22) Left Ankle Inversion: 34 (was 22) Left Ankle Eversion: 28 (was 10) LLE Strength Left Hip Flexion: 4/5 Left Hip Extension: 4/5 Left Hip ABduction: 4/5 Left Knee Flexion: 4/5 Left Knee Extension:  (4+/5 ) Left Ankle Dorsiflexion: 3+/5 Left Ankle Plantar Flexion: 4/5 Left Ankle Inversion: 4/5 Left Ankle Eversion: 3+/5  Exercise/Treatments Stretches Active Hamstring Stretch: 3 reps;30 seconds;Limitations Active Hamstring Stretch Limitations: 3 ways Hip  Flexor Stretch: 3 reps;20 seconds;Limitations Hip Flexor Stretch Limitations: 3 ways Gastroc Stretch: 3 reps;30 seconds;Limitations Gastroc Stretch Limitations: slant board Standing Heel Raises: 15 reps;5 seconds;Limitations Heel Raises Limitations: toeraisee 15 reps with eccentric control descending Other Standing Knee Exercises: 3D ankle excursion 10x; 3D hip excursion 10x    Physical Therapy Assessment and Plan PT Assessment and Plan Clinical Impression Statement: Reassessment complete with the following findings:  LE mobilty with improved AROM for ankle, hip and knee.  Pt reported pain free today, pt has wisdom tooth abscess last week so is currently on pain medicated for tooth that may be accountable for pain free today.  Pt continues to ambulate with restricted hip and knee extension.  Pt will benefit from skilled intervention to improve AROM to improve gait and reduce pain.   PT Treatment/Interventions: Gait training;Stair training;Functional mobility training;Therapeutic activities;Therapeutic exercise;Balance training;Manual techniques;Modalities;Patient/family education PT Plan: Recommend continuing OPPT for 4 more weeks (3x a week) to improve remaining deficits.      Goals PT Short Term Goals PT Short Term Goal 1: Patient will increase ankle dorsiflexion to >15 degrees to decrease early heel off during walking (8 degrees) PT Short Term Goal 1 - Progress: Progressing toward goal PT Short Term Goal 2: Patient will icrease ankle plantar flexion to 40 degrees to be able to stand on tip of toes to reach to high kitchen shelf (34 degrees) PT Short Term Goal 2 - Progress: Progressing toward goal PT Short Term Goal 3: Patient will increase knee extension to 0 degrees to be able to walk with a longer stride length PT Short  Term Goal 3 - Progress: Progressing toward goal PT Short Term Goal 4: Patint will increase hip extension to ambulate with increased stride length PT Short Term Goal 4 -  Progress: Progressing toward goal PT Short Term Goal 5: Patient will increase ankle inversion/eversion to 30/15 degrees to improve normal ankle AROM PT Short Term Goal 5 - Progress: Progressing toward goal PT Long Term Goals PT Long Term Goal 1: Patient will increase great toe dorsiflextion to 40 degree to decrease early toe off durign gait PT Long Term Goal 1 - Progress: Progressing toward goal PT Long Term Goal 2: Patient will increase hip internal rotation to 30 degrees to increase ability to absorb impact of walking PT Long Term Goal 2 - Progress: Progressing toward goal Long Term Goal 3: Patient will be able to perform sit to stand 5x in less than 15seconds without UE support indicatign improved hip strength and patient not at high risk of falls.  (10 seconds) Long Term Goal 3 Progress: Met Long Term Goal 4: Patient will be able to walk 30 minutes with pain less than 3/10 Long Term Goal 4 Progress: Progressing toward goal  Problem List Patient Active Problem List   Diagnosis Date Noted  . HNP (herniated nucleus pulposus), cervical 06/05/2013    Class: Diagnosis of  . 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  . Muscle spasms of neck 04/04/2013  . 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 OA (osteoarthritis) of knee 02/22/2013  . Iliotibial band syndrome of left side 11/22/2012  . Localized, primary osteoarthritis of the ankle and foot 10/11/2012  . Difficulty in walking 08/17/2012  . Stiffness of joint, not elsewhere classified, ankle and foot 08/17/2012  . Peroneal tendinitis 08/16/2012  . Shortness of breath 02/09/2012  . Precordial pain 02/09/2012  . Essential hypertension, benign 02/09/2012  . Mixed hyperlipidemia 02/09/2012  . Dysthymia 02/04/2012  . Pain in joint, ankle and foot 12/23/2011  . Abnormality of gait 12/23/2011  . Lateral epicondylitis/tennis elbow 05/06/2011  . Trigger point of extremity 05/06/2011  . TENOSYNOVITIS OF FOOT AND ANKLE 10/22/2010    PT -  End of Session Activity Tolerance: Patient tolerated treatment well General Behavior During Therapy: WFL for tasks assessed/performed  GP Functional Assessment Tool Used: FOTO 52% limitation (was 54% limited) Functional Limitation: Mobility: Walking and moving around Mobility: Walking and Moving Around Current Status (T2671): At least 40 percent but less than 60 percent impaired, limited or restricted Mobility: Walking and Moving Around Goal Status [id number]): At least 20 percent but less than 40 percent impaired, limited or restricted  Aldona Lento 08/13/2014, 12:08 PM  Devona Konig PT, DPT  Physician Documentation Your signature is required to indicate approval of the treatment plan as stated above.  Please sign and either send electronically or make a copy of this report for your files and return this physician signed original.   Please mark one 1.__approve of plan  2. ___approve of plan with the following conditions.   ______________________________                                                          _____________________ Physician Signature  Date  

## 2014-08-15 ENCOUNTER — Ambulatory Visit (HOSPITAL_COMMUNITY)
Admission: RE | Admit: 2014-08-15 | Discharge: 2014-08-15 | Disposition: A | Discharge status: Home / Self Care | Payer: Medicare Other | Source: Ambulatory Visit | Attending: Orthopedic Surgery | Admitting: Orthopedic Surgery

## 2014-08-15 DIAGNOSIS — IMO0001 Reserved for inherently not codable concepts without codable children: Secondary | ICD-10-CM | POA: Diagnosis not present

## 2014-08-15 NOTE — Progress Notes (Signed)
Physical Therapy Treatment Patient Details  Name: Zachary [name] MRN: [id number] Date of Birth: 02-06-1946  Today's Date: 08/15/2014 Time: 5035-4656 PT Time Calculation (min): 45 min   Charges: Manual [phone], TherEx [id number] Visit#: 12 of 23  Re-eval: 09/10/14 Assessment Diagnosis: Lt foot stiffness secondary to pain and aquired cavorvaus deformity of Lt foot.  Next MD Visit: Doran Durand 3 months Prior Therapy: no  Authorization: Medicare  Authorization Time Period: Gcode complete 10th visit  Authorization Visit#: 12 of 20   Subjective: Symptoms/Limitations Symptoms: Paatient notes excessive difficulty performing sit to stand at football practice requiring the players to help him stand up patient notes contineud though improved pain in Rt lateral ankle. .  Pain Assessment Currently in Pain?: Yes Pain Score: 3  Pain Location: Ankle Pain Orientation: Left;Lateral Pain Type: Chronic pain  Exercise/Treatments Stretches Knee: Self-Stretch to increase Flexion: Limitations Knee: Self-Stretch Limitations: Standing rectus femoris stretch 4x 20seconds , retro 14"  Standing Heel Raises: 15 reps;5 seconds;Limitations Heel Raises Limitations: toeraisee 15 reps with eccentric control descending Functional Squat: Limitations Functional Squat Limitations: feet neutral and split stance 10x each Other Standing Knee Exercises: 3D ankle excursion 10x; 3D hip excursion 10x  Manual Therapy Joint Mobilization: talocrural, subtalar, midtarsal, and great toe grade 2-3 joint mobilizations Lt foot, cuboid whip  Manual quad stretch 10x 20seconds  Physical Therapy Assessment and Plan PT Assessment and Plan Clinical Impression Statement: Patient's diofficulty wwith performance of sit to stand attrributed to limited rectus femoris mobility and limited glut strength. Patient displayed improved sit to stand following squattign and rectus femoris stretch as well as noting decreased ankle pain following manual  therapy.     Problem List Patient Active Problem List   Diagnosis Date Noted  . HNP (herniated nucleus pulposus), cervical 06/05/2013    Class: Diagnosis of  . 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  . Muscle spasms of neck 04/04/2013  . 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 OA (osteoarthritis) of knee 02/22/2013  . Iliotibial band syndrome of left side 11/22/2012  . Localized, primary osteoarthritis of the ankle and foot 10/11/2012  . Difficulty in walking 08/17/2012  . Stiffness of joint, not elsewhere classified, ankle and foot 08/17/2012  . Peroneal tendinitis 08/16/2012  . Shortness of breath 02/09/2012  . Precordial pain 02/09/2012  . Essential hypertension, benign 02/09/2012  . Mixed hyperlipidemia 02/09/2012  . Dysthymia 02/04/2012  . Pain in joint, ankle and foot 12/23/2011  . Abnormality of gait 12/23/2011  . Lateral epicondylitis/tennis elbow 05/06/2011  . Trigger point of extremity 05/06/2011  . TENOSYNOVITIS OF FOOT AND ANKLE 10/22/2010    PT - End of Session Activity Tolerance: Patient tolerated treatment well General Behavior During Therapy: [facility] for tasks assessed/performed  GP    Lorelle Macaluso R 08/15/2014, 9:27 AM

## 2014-08-17 ENCOUNTER — Ambulatory Visit (HOSPITAL_COMMUNITY)
Admission: RE | Admit: 2014-08-17 | Discharge: 2014-08-17 | Disposition: A | Discharge status: Home / Self Care | Payer: Medicare Other | Source: Ambulatory Visit | Attending: Orthopedic Surgery | Admitting: Orthopedic Surgery

## 2014-08-17 ENCOUNTER — Encounter (HOSPITAL_COMMUNITY): Payer: Self-pay | Admitting: Psychiatry

## 2014-08-17 ENCOUNTER — Ambulatory Visit (INDEPENDENT_AMBULATORY_CARE_PROVIDER_SITE_OTHER): Payer: Medicare Other | Admitting: Psychiatry

## 2014-08-17 VITALS — BP 156/74 | HR 61 | Ht 72.0 in | Wt 247.2 lb

## 2014-08-17 DIAGNOSIS — IMO0001 Reserved for inherently not codable concepts without codable children: Secondary | ICD-10-CM | POA: Diagnosis not present

## 2014-08-17 DIAGNOSIS — F320 Major depressive disorder, single episode, mild: Secondary | ICD-10-CM | POA: Diagnosis not present

## 2014-08-17 MED ORDER — ESCITALOPRAM OXALATE 10 MG PO TABS: 10.0000 mg | ORAL_TABLET | Freq: Every day | ORAL | Status: DC

## 2014-08-17 MED ORDER — DIAZEPAM 5 MG PO TABS: 5.0000 mg | ORAL_TABLET | Freq: Two times a day (BID) | ORAL | Status: DC | PRN

## 2014-08-17 NOTE — Progress Notes (Addendum)
Physical Therapy Treatment Patient Details  Name: Zachary [name] MRN: [id number] Date of Birth: 1946-06-16  Today's Date: 08/17/2014 Time: 1019-1105 PT Time Calculation (min): 46 min   CHarges: manual [id number], therEx 1029-1105 Visit#: 13 of 23  Re-eval: 09/10/14 Assessment Diagnosis: Lt foot stiffness secondary to pain and aquired cavorvaus deformity of Lt foot.  Next MD Visit: Doran Durand 3 months Prior Therapy: no  Authorization: Medicare  Authorization Time Period:    Authorization Visit#: 13 of 20   Subjective: Symptoms/Limitations Symptoms: Patient notes soreness follwoing strengtheing exercises to address excessive difficulty performing sit to stand, and notes continued lateral Lt ankklle pain Patient Stated Goals: To be able to walk without pain.  Pain Assessment Currently in Pain?: Yes Pain Score: 3  Pain Location: Ankle  Exercise/Treatments Stretches Gastroc Stretch: 3 reps;30 seconds;Limitations Gastroc Stretch Limitations: slant board Soleus Stretch: Limitations;20 seconds;3 reps Soleus Stretch Limitations: 3 way 10x 3 seconds with towel under toes Standing Heel Raises: 15 reps;5 seconds;Limitations Heel Raises Limitations: toeraisee 15 reps with eccentric control descending Forward Lunges: Limitations Forward Lunges Limitations: anterior lateral lunge walks with therapist assisst for correct performance.  Functional Squat Limitations: squat walk around 10x Rocker Board: 2 minutes;Limitations Rocker Board Limitations: 49min AP, LT-Rt each and wobble board 10x CW/CCW SLS: Single leg balance reach common and uncommon with slider and cane 5x Other Standing Knee Exercises: 3D ankle excursion 10x; 3D hip excursion 10x Other Standing Knee Exercises: 3 way knee driver with mobilization with movemnt to increase ankel dorsiflexion 10x  Manual Therapy Manual Therapy: Joint mobilization Joint Mobilization: Hip internal rotation mobilization and MET to increase hip internal  rotation.   Physical Therapy Assessment and Plan PT Assessment and Plan Clinical Impression Statement: Patient demsontrated gait this session demosntrating noted instability in bilateral LE with excessive knee varus (Lt>Rt) and difficuklty deceleratign landing during gait, this improved minimally with stretchign of ITband andmoderately with hip internal rotation and external rotation m,obilizations. Following anterior lateral lunges with therapist tactile cuign to increase pelvic rotation patient noted signifcantly decreased pain, and demosntrated improved gait with decreased knee varus moment and improved tibial/femoral interal/external rotaion during transition zone 1 and 2 of gait.  PT Plan: contineu anterior lateral lunges, emphasize groin, hip internal and external rotation mobility in addition to contineud focus on improvign ankle mobility.     Goals PT Short Term Goals PT Short Term Goal 1: Patient will increase ankle dorsiflexion to >15 degrees to decrease early heel off during walking PT Short Term Goal 1 - Progress: Progressing toward goal PT Short Term Goal 2: Patient will icrease ankle plantar flexion to 40 degrees to be able to stand on tip of toes to reach to high kitchen shelf PT Short Term Goal 2 - Progress: Progressing toward goal PT Short Term Goal 3: Patient will increase knee extension to 0 degrees to be able to walk with a longer stride length PT Short Term Goal 3 - Progress: Progressing toward goal PT Short Term Goal 4: Patint will increase hip extension to ambulate with increased stride length PT Short Term Goal 4 - Progress: Progressing toward goal PT Short Term Goal 5: Patient will increase ankle inversion/eversion to 30/15 degrees to improve normal ankle AROM PT Short Term Goal 5 - Progress: Progressing toward goal  Problem List Patient Active Problem List   Diagnosis Date Noted  . HNP (herniated nucleus pulposus), cervical 06/05/2013    Class: Diagnosis of  . CNS  disorder 04/04/2013  . Muscle spasms of neck 04/04/2013  .  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 OA (osteoarthritis) of knee 02/22/2013  . Iliotibial band syndrome of left side 11/22/2012  . Localized, primary osteoarthritis of the ankle and foot 10/11/2012  . Difficulty in walking 08/17/2012  . Stiffness of joint, not elsewhere classified, ankle and foot 08/17/2012  . Peroneal tendinitis 08/16/2012  . Shortness of breath 02/09/2012  . Precordial pain 02/09/2012  . Essential hypertension, benign 02/09/2012  . Mixed hyperlipidemia 02/09/2012  . Dysthymia 02/04/2012  . Pain in joint, ankle and foot 12/23/2011  . Abnormality of gait 12/23/2011  . Lateral epicondylitis/tennis elbow 05/06/2011  . Trigger point of extremity 05/06/2011  . TENOSYNOVITIS OF FOOT AND ANKLE 10/22/2010    PT - End of Session Activity Tolerance: Patient tolerated treatment well General Behavior During Therapy: [facility] for tasks assessed/performed  GP    Kaedence Connelly R 08/17/2014, 12:03 PM
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
## 2014-08-20 ENCOUNTER — Ambulatory Visit (HOSPITAL_COMMUNITY)
Admission: RE | Admit: 2014-08-20 | Discharge: 2014-08-20 | Disposition: A | Discharge status: Home / Self Care | Payer: Medicare Other | Source: Ambulatory Visit | Attending: Internal Medicine | Admitting: Internal Medicine

## 2014-08-20 DIAGNOSIS — IMO0001 Reserved for inherently not codable concepts without codable children: Secondary | ICD-10-CM | POA: Diagnosis not present

## 2014-08-20 NOTE — Progress Notes (Signed)
Physical Therapy Treatment Patient Details  Name: Zachary [name] MRN: [id number] Date of Birth: April 25, 1946  Today's Date: 08/20/2014 Time: 0850-0930 PT Time Calculation (min): 40 min Charge: TE [id number]  Visit#: 14 of 23  Re-eval: 09/10/14    Authorization: Medicare  Authorization Time Period: Dareen Piano complete 10th visit  Authorization Visit#: 14 of 20   Subjective: Symptoms/Limitations Symptoms: Pt stated pain scale the same, burning contiues.  Pt stated he over did it Saturday standing for around 2 hours mowing grass.  Feels his LE is getting stronger. Pain Assessment Currently in Pain?: Yes Pain Score: 3  Pain Location: Ankle Pain Orientation: Left;Lateral  Objective:   Exercise/Treatments Stretches Active Hamstring Stretch: 3 reps;30 seconds;Limitations Active Hamstring Stretch Limitations: 3 ways Hip Flexor Stretch: 3 reps;20 seconds;Limitations Hip Flexor Stretch Limitations: hip flexion and groin 8in step Piriformis Stretch: 3 reps;20 seconds;Limitations Piriformis Stretch Limitations: seated Bil Gastroc Stretch: 3 reps;30 seconds;Limitations Gastroc Stretch Limitations: slant board Soleus Stretch: Limitations;20 seconds;3 reps Soleus Stretch Limitations: 3 way 10x 3 seconds with towel under toes Standing Heel Raises: 15 reps;5 seconds;Limitations Heel Raises Limitations: toeraisee 15 reps with eccentric control descending Forward Lunges: Limitations Forward Lunges Limitations: anterior lateral lunge walks with therapist assisst for correct performance Functional Squat: 5 sets;Limitations Functional Squat Limitations: squat reach matrix with 3# Other Standing Knee Exercises: 3D ankle excursion 10x; 3D hip excursion 10x Other Standing Knee Exercises: 3 way knee driver with mobilization with movemnt to increase ankel dorsiflexion 10x   Physical Therapy Assessment and Plan PT Assessment and Plan Clinical Impression Statement: Session focus on improving ankle and  hip mobiltyto reduce pain and improve gait mechanics.  Stretches compliete Bil LE this session to improve gait.  Added squat reach matrix for gluteal strengthening and to improve ROM with hip mobilty.   PT Plan: continue anterior lateral lunges, emphasize groin, hip internal and external rotation mobility in addition to contineud focus on improvign ankle mobility.     Goals PT Short Term Goals PT Short Term Goal 1: Patient will increase ankle dorsiflexion to >15 degrees to decrease early heel off during walking PT Short Term Goal 1 - Progress: Progressing toward goal PT Short Term Goal 2: Patient will increase ankle plantar flexion to 40 degrees to be able to stand on tip of toes to reach to high kitchen shelf PT Short Term Goal 2 - Progress: Progressing toward goal PT Short Term Goal 3: Patient will increase knee extension to 0 degrees to be able to walk with a longer stride length PT Short Term Goal 3 - Progress: Progressing toward goal PT Short Term Goal 4: Patint will increase hip extension to ambulate with increased stride length PT Short Term Goal 4 - Progress: Progressing toward goal PT Short Term Goal 5: Patient will increase ankle inversion/eversion to 30/15 degrees to improve normal ankle AROM PT Short Term Goal 5 - Progress: Progressing toward goal PT Long Term Goals PT Long Term Goal 1: Patient will increase great toe dorsiflextion to 40 degree to decrease early toe off durign gait PT Long Term Goal 2: Patient will increase hip internal rotation to 30 degrees to increase ability to absorb impact of walking Long Term Goal 3: Patient will be able to perform sit to stand 5x in less than 15seconds without UE support indicatign improved hip strength and patient not at high risk of falls.  Long Term Goal 4: Patient will be able to walk 30 minutes with pain less than 3/10  Problem List Patient Active  Problem List   Diagnosis Date Noted  . HNP (herniated nucleus pulposus), cervical  06/05/2013    Class: Diagnosis of  . 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  . Muscle spasms of neck 04/04/2013  . 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 OA (osteoarthritis) of knee 02/22/2013  . Iliotibial band syndrome of left side 11/22/2012  . Localized, primary osteoarthritis of the ankle and foot 10/11/2012  . Difficulty in walking 08/17/2012  . Stiffness of joint, not elsewhere classified, ankle and foot 08/17/2012  . Peroneal tendinitis 08/16/2012  . Shortness of breath 02/09/2012  . Precordial pain 02/09/2012  . Essential hypertension, benign 02/09/2012  . Mixed hyperlipidemia 02/09/2012  . Dysthymia 02/04/2012  . Pain in joint, ankle and foot 12/23/2011  . Abnormality of gait 12/23/2011  . Lateral epicondylitis/tennis elbow 05/06/2011  . Trigger point of extremity 05/06/2011  . TENOSYNOVITIS OF FOOT AND ANKLE 10/22/2010    PT - End of Session Activity Tolerance: Patient tolerated treatment well General Behavior During Therapy: [facility] for tasks assessed/performed  GP    Aldona Lento 08/20/2014, 12:06 PM

## 2014-08-22 ENCOUNTER — Ambulatory Visit (HOSPITAL_COMMUNITY)
Admission: RE | Admit: 2014-08-22 | Discharge: 2014-08-22 | Disposition: A | Discharge status: Home / Self Care | Payer: Medicare Other | Source: Ambulatory Visit | Attending: Orthopedic Surgery | Admitting: Orthopedic Surgery

## 2014-08-22 DIAGNOSIS — IMO0001 Reserved for inherently not codable concepts without codable children: Secondary | ICD-10-CM | POA: Diagnosis not present

## 2014-08-22 NOTE — Progress Notes (Signed)
Physical Therapy Treatment Patient Details  Name: Zachary [name] MRN: [id number] Date of Birth: 18-Jun-1946  Today's Date: 08/22/2014 Time: 0800-0845 PT Time Calculation (min): 45 min  Visit#: 15 of 23  Re-eval: 09/10/14 Authorization: Medicare  Authorization Time Period: Dareen Piano complete 10th visit  Authorization Visit#: 15 of 20  Charges:  therex 45  Subjective: Symptoms/Limitations Symptoms: Pt states his pain remains the same.  Feels it is getting better overall. Pain Assessment Currently in Pain?: Yes Pain Score: 3  Pain Location: Ankle   Exercise/Treatments Stretches Active Hamstring Stretch: 3 reps;30 seconds;Limitations Active Hamstring Stretch Limitations: 3 ways Hip Flexor Stretch: 3 reps;20 seconds;Limitations Hip Flexor Stretch Limitations: hip flexion and groin 8in step Piriformis Stretch: 3 reps;20 seconds;Limitations Piriformis Stretch Limitations: seated Bil Gastroc Stretch: 3 reps;30 seconds;Limitations Gastroc Stretch Limitations: slant board Soleus Stretch: Limitations;20 seconds;3 reps Soleus Stretch Limitations: 3 way 10x 3 seconds with towel under toes Standing Heel Raises: 15 reps;5 seconds;Limitations Heel Raises Limitations: toeraisee 15 reps with eccentric control descending Forward Lunges: Limitations Forward Lunges Limitations: 2RT anterior lateral lunge walks with therapist assisst for correct performance Functional Squat: 5 sets;Limitations Functional Squat Limitations: squat reach matrix with 3# Other Standing Knee Exercises: 3D ankle excursion 10x; 3D hip excursion 10x Other Standing Knee Exercises: 3 way knee driver with mobilization with movemnt to increase ankel dorsiflexion 10x    Physical Therapy Assessment and Plan PT Assessment and Plan Clinical Impression Statement: Session completed without c/o pain or increase in symptoms.  Pt continues to require therapist facilitation to complete exercises in correct form and posture. Deeper  squat noted today with activities.  Pt reported feeling better at completion of therapy. PT Plan: continue anterior lateral lunges, emphasize groin, hip internal and external rotation mobility in addition to contineud focus on improvign ankle mobility.     Problem List Patient Active Problem List   Diagnosis Date Noted  . HNP (herniated nucleus pulposus), cervical 06/05/2013    Class: Diagnosis of  . 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  . Muscle spasms of neck 04/04/2013  . 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 OA (osteoarthritis) of knee 02/22/2013  . Iliotibial band syndrome of left side 11/22/2012  . Localized, primary osteoarthritis of the ankle and foot 10/11/2012  . Difficulty in walking 08/17/2012  . Stiffness of joint, not elsewhere classified, ankle and foot 08/17/2012  . Peroneal tendinitis 08/16/2012  . Shortness of breath 02/09/2012  . Precordial pain 02/09/2012  . Essential hypertension, benign 02/09/2012  . Mixed hyperlipidemia 02/09/2012  . Dysthymia 02/04/2012  . Pain in joint, ankle and foot 12/23/2011  . Abnormality of gait 12/23/2011  . Lateral epicondylitis/tennis elbow 05/06/2011  . Trigger point of extremity 05/06/2011  . TENOSYNOVITIS OF FOOT AND ANKLE 10/22/2010    PT - End of Session Activity Tolerance: Patient tolerated treatment well General Behavior During Therapy: [facility] for tasks assessed/performed   Teena Irani, PTA/CLT 08/22/2014, 9:04 AM

## 2014-08-24 ENCOUNTER — Ambulatory Visit (HOSPITAL_COMMUNITY)
Admission: RE | Admit: 2014-08-24 | Discharge: 2014-08-24 | Disposition: A | Discharge status: Home / Self Care | Payer: Medicare Other | Source: Ambulatory Visit | Attending: Orthopedic Surgery | Admitting: Orthopedic Surgery

## 2014-08-24 DIAGNOSIS — IMO0001 Reserved for inherently not codable concepts without codable children: Secondary | ICD-10-CM | POA: Diagnosis not present

## 2014-08-24 NOTE — Progress Notes (Signed)
Physical Therapy Treatment Patient Details  Name: Zachary [name] MRN: [id number] Date of Birth: 1946/12/21  Today's Date: 08/24/2014 Time: 6606-3016 PT Time Calculation (min): 43 min   Charges: TE [id number] Visit#: 16 of 23  Re-eval: 09/10/14 Assessment Diagnosis: Lt foot stiffness secondary to pain and aquired cavorvaus deformity of Lt foot.  Next MD Visit: Doran Durand 3 months Prior Therapy: no  Authorization: Medicare  Authorization Time Period: Gcode complete 10th visit  Authorization Visit#: 16 of 20   Subjective: Symptoms/Limitations Symptoms: Patient states he can feel the difference and notes he is getting stronger and able to move better. Patient notes no difficulty with sit to stand recently  Exercise/Treatments Stretches Gastroc Stretch: Limitations;2 reps;20 seconds Gastroc Stretch Limitations: slant board 3 way Soleus Stretch: Limitations;20 seconds;3 reps Soleus Stretch Limitations: 3 way 10x 3 seconds with towel under toes Standing Heel Raises: 15 reps;5 seconds;Limitations Heel Raises Limitations: heel raise matrix 5x each (35 heel raises all together) Forward Lunges: Limitations Forward Lunges Limitations: 2RT anterior lateral lunge walks with therapist assisst for correct performance Functional Squat Limitations: Squat matrix 5x each squat walk around Other Standing Knee Exercises: 3D ankle excursion 10x at wall and on airex 10x Other Standing Knee Exercises: 3 way knee driver with mobilization with movemnt to increase ankel dorsiflexion 10x  Physical Therapy Assessment and Plan PT Assessment and Plan Clinical Impression Statement: Int4oduced squat walk around and squat matrix to further progress hip mobility to decrease strain on ankles to absorb shock during gait and further porgress mo-stability. Patient demsontrated good performance of stretching exercises that will continue to be performed as part of HEP. Patient noted no pain during session and noted feeling  better at end of session.     Goals PT Short Term Goals PT Short Term Goal 1: Patient will increase ankle dorsiflexion to >15 degrees to decrease early heel off during walking PT Short Term Goal 1 - Progress: Progressing toward goal PT Short Term Goal 2: Patient will increase ankle plantar flexion to 40 degrees to be able to stand on tip of toes to reach to high kitchen shelf PT Short Term Goal 2 - Progress: Progressing toward goal PT Short Term Goal 3: Patient will increase knee extension to 0 degrees to be able to walk with a longer stride length PT Short Term Goal 3 - Progress: Progressing toward goal PT Short Term Goal 4: Patint will increase hip extension to ambulate with increased stride length PT Short Term Goal 4 - Progress: Progressing toward goal PT Short Term Goal 5: Patient will increase ankle inversion/eversion to 30/15 degrees to improve normal ankle AROM PT Short Term Goal 5 - Progress: Progressing toward goal PT Long Term Goals PT Long Term Goal 1: Patient will increase great toe dorsiflextion to 40 degree to decrease early toe off durign gait PT Long Term Goal 1 - Progress: Progressing toward goal PT Long Term Goal 2: Patient will increase hip internal rotation to 30 degrees to increase ability to absorb impact of walking PT Long Term Goal 2 - Progress: Progressing toward goal Long Term Goal 4: Patient will be able to walk 30 minutes with pain less than 3/10  Problem List Patient Active Problem List   Diagnosis Date Noted  . HNP (herniated nucleus pulposus), cervical 06/05/2013    Class: Diagnosis of  . 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  . Muscle spasms of neck 04/04/2013  . 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 OA (osteoarthritis) of knee 02/22/2013  . Iliotibial  band syndrome of left side 11/22/2012  . Localized, primary osteoarthritis of the ankle and foot 10/11/2012  . Difficulty in walking 08/17/2012  . Stiffness of joint, not elsewhere classified,  ankle and foot 08/17/2012  . Peroneal tendinitis 08/16/2012  . Shortness of breath 02/09/2012  . Precordial pain 02/09/2012  . Essential hypertension, benign 02/09/2012  . Mixed hyperlipidemia 02/09/2012  . Dysthymia 02/04/2012  . Pain in joint, ankle and foot 12/23/2011  . Abnormality of gait 12/23/2011  . Lateral epicondylitis/tennis elbow 05/06/2011  . Trigger point of extremity 05/06/2011  . TENOSYNOVITIS OF FOOT AND ANKLE 10/22/2010    PT - End of Session Activity Tolerance: Patient tolerated treatment well General Behavior During Therapy: [facility] for tasks assessed/performed  GP    Zachary [name] R 08/24/2014, 9:31 AM

## 2014-08-25 ENCOUNTER — Other Ambulatory Visit (HOSPITAL_COMMUNITY): Payer: Self-pay | Admitting: Psychiatry

## 2014-08-27 ENCOUNTER — Ambulatory Visit (HOSPITAL_COMMUNITY)
Admission: RE | Admit: 2014-08-27 | Discharge: 2014-08-27 | Disposition: A | Discharge status: Home / Self Care | Payer: Medicare Other | Source: Ambulatory Visit | Attending: Orthopedic Surgery | Admitting: Orthopedic Surgery

## 2014-08-27 DIAGNOSIS — IMO0001 Reserved for inherently not codable concepts without codable children: Secondary | ICD-10-CM | POA: Diagnosis not present

## 2014-08-27 NOTE — Progress Notes (Signed)
Physical Therapy Treatment Patient Details  Name: Zachary [name] MRN: [id number] Date of Birth: 05-11-1946  Today's Date: 08/27/2014 Time: 0800-0850 PT Time Calculation (min): 50 min  Visit#: 17 of 23  Re-eval: 09/10/14 Authorization: Medicare  Authorization Time Period: Dareen Piano complete 10th visit  Authorization Visit#: 17 of 20  Charges:  therex [phone] (30'), manual [phone] (10'), icepack 840-850 (10')  Subjective: Symptoms/Limitations Symptoms: Pt states he was mowing over the weekend and stepped in a grate, twisting his ankle.  States it has [place] more since then.  Currently 6/10. Pain Assessment Currently in Pain?: Yes Pain Score: 6  Pain Location: Ankle Pain Orientation: Left;Lateral   Exercise/Treatments Stretches Gastroc Stretch: Limitations;2 reps;20 seconds Gastroc Stretch Limitations: slant board 3 way Soleus Stretch: Limitations;20 seconds;3 reps Soleus Stretch Limitations: 3 way 10x 3 seconds with towel under toes Standing Heel Raises: 15 reps;5 seconds;Limitations Heel Raises Limitations: heel raise matrix 5x each (35 heel raises all together) Forward Lunges: Limitations Forward Lunges Limitations: 2RT anterior lateral lunge walks with therapist assisst for correct performance Functional Squat Limitations: Squat matrix 10x each squat walk around with foot propped against box (working on [employer]) Other Standing Knee Exercises: 3D ankle excursion 10x at wall and on airex 10x Other Standing Knee Exercises: 3 way knee driver with mobilization with movemnt to increase ankel dorsiflexion 10x   Manual Therapy Manual Therapy: Joint mobilization Other Manual Therapy: talocrural, subtalar, midtarsal, and great toe grade 2-3 joint mobilizations Lt foot  icepack to Lt ankle with elevation 10 minutes  Physical Therapy Assessment and Plan PT Assessment and Plan Clinical Impression Statement: Pt with exaccerbation of pain today due to turning ankle over the weekend.  Pt  able  to complete all exercises without increased pain.  Completed session with manual and ice due to incident.  Pt with hypersensitivity lateral ankle and LE with increased edema noted around lateral malleoli.   Pt reported reduction of pain at end of session.   PT Plan: continue anterior lateral lunges, emphasize groin, hip internal and external rotation mobility in addition to contineud focus on improving ankle mobility.      Problem List Patient Active Problem List   Diagnosis Date Noted  . HNP (herniated nucleus pulposus), cervical 06/05/2013    Class: Diagnosis of  . 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  . Muscle spasms of neck 04/04/2013  . 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 OA (osteoarthritis) of knee 02/22/2013  . Iliotibial band syndrome of left side 11/22/2012  . Localized, primary osteoarthritis of the ankle and foot 10/11/2012  . Difficulty in walking 08/17/2012  . Stiffness of joint, not elsewhere classified, ankle and foot 08/17/2012  . Peroneal tendinitis 08/16/2012  . Shortness of breath 02/09/2012  . Precordial pain 02/09/2012  . Essential hypertension, benign 02/09/2012  . Mixed hyperlipidemia 02/09/2012  . Dysthymia 02/04/2012  . Pain in joint, ankle and foot 12/23/2011  . Abnormality of gait 12/23/2011  . Lateral epicondylitis/tennis elbow 05/06/2011  . Trigger point of extremity 05/06/2011  . TENOSYNOVITIS OF FOOT AND ANKLE 10/22/2010    PT - End of Session Activity Tolerance: Patient tolerated treatment well General Behavior During Therapy: [facility] for tasks assessed/performed  GP    Teena Irani, PTA/CLT 08/27/2014, 8:47 AM

## 2014-08-29 ENCOUNTER — Ambulatory Visit (HOSPITAL_COMMUNITY)
Admission: RE | Admit: 2014-08-29 | Discharge: 2014-08-29 | Disposition: A | Discharge status: Home / Self Care | Payer: Medicare Other | Source: Ambulatory Visit | Attending: Internal Medicine | Admitting: Internal Medicine

## 2014-08-29 DIAGNOSIS — IMO0001 Reserved for inherently not codable concepts without codable children: Secondary | ICD-10-CM | POA: Insufficient documentation

## 2014-08-29 DIAGNOSIS — M25559 Pain in unspecified hip: Secondary | ICD-10-CM | POA: Insufficient documentation

## 2014-08-29 DIAGNOSIS — M25659 Stiffness of unspecified hip, not elsewhere classified: Secondary | ICD-10-CM | POA: Diagnosis not present

## 2014-08-29 DIAGNOSIS — I10 Essential (primary) hypertension: Secondary | ICD-10-CM | POA: Insufficient documentation

## 2014-08-29 DIAGNOSIS — E782 Mixed hyperlipidemia: Secondary | ICD-10-CM | POA: Insufficient documentation

## 2014-08-29 DIAGNOSIS — M25579 Pain in unspecified ankle and joints of unspecified foot: Secondary | ICD-10-CM | POA: Insufficient documentation

## 2014-08-29 DIAGNOSIS — R269 Unspecified abnormalities of gait and mobility: Secondary | ICD-10-CM | POA: Diagnosis not present

## 2014-08-29 DIAGNOSIS — M25676 Stiffness of unspecified foot, not elsewhere classified: Secondary | ICD-10-CM | POA: Insufficient documentation

## 2014-08-29 DIAGNOSIS — M25673 Stiffness of unspecified ankle, not elsewhere classified: Secondary | ICD-10-CM | POA: Diagnosis not present

## 2014-08-29 NOTE — Progress Notes (Signed)
Physical Therapy Treatment Patient Details  Name: Zachary [name] MRN: [id number] Date of Birth: Oct 23, 1946  Today's Date: 08/29/2014 Time: 0802-0847 PT Time Calculation (min): 45 min  Visit#: 18 of 23  Re-eval: 09/10/14 Authorization: Medicare  Authorization Time Period: Dareen Piano complete 10th visit  Authorization Visit#: 18 of 20  Charges:  therex [phone] (33'), manual [phone] (10')  Subjective: Symptoms/Limitations Symptoms: Pt states last visit helped decrease his pain.  Currently with 3/10 pain Pain Assessment Currently in Pain?: Yes Pain Score: 3  Pain Location: Ankle Pain Orientation: Left;Lateral   Exercise/Treatments Stretches Active Hamstring Stretch: 3 reps;30 seconds;Limitations Active Hamstring Stretch Limitations: 3 ways Hip Flexor Stretch: 3 reps;20 seconds;Limitations Hip Flexor Stretch Limitations: hip flexion and groin 8in step Gastroc Stretch: Limitations;2 reps;20 seconds Gastroc Stretch Limitations: slant board 3 way Soleus Stretch: Limitations;20 seconds;3 reps Soleus Stretch Limitations: 3 way 10x 3 seconds with towel under toes Standing Heel Raises: Limitations Heel Raises Limitations: heel raise matrix 5x each (60 heel raises all together) Forward Lunges: Limitations Forward Lunges Limitations: 2RT anterior lateral lunge walks with therapist assisst for correct performance Functional Squat: 10 reps Functional Squat Limitations: Squat matrix 10x each Other Standing Knee Exercises: 3D ankle excursion 10x at wall and on airex 10x Other Standing Knee Exercises: 3 way knee driver with mobilization with movemnt to increase ankel dorsiflexion 10x   Manual Therapy Manual Therapy: Joint mobilization Other Manual Therapy: talocrural, subtalar, midtarsal, and great toe grade 2-3 joint mobilizations Lt foot  Myofascial and STM to lateral ankle.  Physical Therapy Assessment and Plan PT Assessment and Plan Clinical Impression Statement: decreased pain noted  today since last session.  NOted reduction in swelling and sensitivity with manual techniques.  Pt without complaint of pain during sessio with any activity.  Pt continues to ice at home as needed.  No pain at end of session. PT Plan: continue anterior lateral lunges, emphasize groin, hip internal and external rotation mobility in addition to contineud focus on improving ankle mobility.      Problem List Patient Active Problem List   Diagnosis Date Noted  . HNP (herniated nucleus pulposus), cervical 06/05/2013    Class: Diagnosis of  . 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  . Muscle spasms of neck 04/04/2013  . 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 OA (osteoarthritis) of knee 02/22/2013  . Iliotibial band syndrome of left side 11/22/2012  . Localized, primary osteoarthritis of the ankle and foot 10/11/2012  . Difficulty in walking 08/17/2012  . Stiffness of joint, not elsewhere classified, ankle and foot 08/17/2012  . Peroneal tendinitis 08/16/2012  . Shortness of breath 02/09/2012  . Precordial pain 02/09/2012  . Essential hypertension, benign 02/09/2012  . Mixed hyperlipidemia 02/09/2012  . Dysthymia 02/04/2012  . Pain in joint, ankle and foot 12/23/2011  . Abnormality of gait 12/23/2011  . Lateral epicondylitis/tennis elbow 05/06/2011  . Trigger point of extremity 05/06/2011  . TENOSYNOVITIS OF FOOT AND ANKLE 10/22/2010    PT - End of Session Activity Tolerance: Patient tolerated treatment well General Behavior During Therapy: [facility] for tasks assessed/performed  GP    Teena Irani, PTA/CLT 08/29/2014, 9:22 AM

## 2014-08-31 ENCOUNTER — Ambulatory Visit (HOSPITAL_COMMUNITY)
Admission: RE | Admit: 2014-08-31 | Discharge: 2014-08-31 | Disposition: A | Discharge status: Home / Self Care | Payer: Medicare Other | Source: Ambulatory Visit | Attending: Internal Medicine | Admitting: Internal Medicine

## 2014-08-31 DIAGNOSIS — IMO0001 Reserved for inherently not codable concepts without codable children: Secondary | ICD-10-CM | POA: Diagnosis not present

## 2014-08-31 NOTE — Progress Notes (Signed)
Physical Therapy Treatment Patient Details  Name: Zachary [name] MRN: [id number] Date of Birth: 10/15/1946  Today's Date: 08/31/2014 Time: 2536-6440 PT Time Calculation (min): 55 min   Charges: Manual [phone], TE [phone] Visit#: 19 of 23  Re-eval: 09/10/14 Assessment Diagnosis: Lt foot stiffness secondary to pain and aquired cavorvaus deformity of Lt foot.  Next MD Visit: Doran Durand 3 months Prior Therapy: no  Authorization: Medicare  Authorization Visit#: 19 of 20   Subjective: Symptoms/Limitations Symptoms: patient states that he sprained his ankel last weekend whcih has since resulted in increased swelling and pain in lateral lt ankle.  Pain Assessment Currently in Pain?: Yes Pain Score: 3  Pain Location: Ankle Pain Orientation: Left;Lateral Pain Type: Chronic pain  Exercise/Treatments Stretches Gastroc Stretch: Limitations;2 reps;20 seconds Gastroc Stretch Limitations: slant board 3 way Soleus Stretch: Limitations;20 seconds;3 reps Soleus Stretch Limitations: 3 way 10x 3 seconds with towel under toes Ankle Stretches Plantar Fascia Stretch: 10 seconds;Limitations Plantar Fascia Stretch Limitations: 10x seated Other Stretch: toes down stretch 10x 10sec Other Stretch: Self calcaneal eversion mobilization 2 minutes  Ankle Exercises - Standing Other Standing Ankle Exercises: Lunging to airex pad with towel under lateral foot to increased calcaneal eversion and ankle dorsiflexion with and manual assistance from therapist 20 Walking anterior lateral lunging 30 ft  Physical Therapy Assessment and Plan PT Assessment and Plan Clinical Impression Statement: Patient arrives with continued increased pain in Lt foot following rolling his ankle at home this past weekend. sweeling has reseulted in decreased ROM in ankle. following manual therapy and stretches patient displays increased ankle ROM and decreased pain. Patient adiced to continue stretches at home with a heel wedge to promote  increased calcaneal eversion to improve shock absorbtion of gait andecrease strain on peroneal tendons.  PT Plan: continue anterior lateral lunges, emphasize groin, hip internal and external rotation mobility in addition to contineud focus on improving ankle mobility with focus on drivign improved ankle dorsiflexion and calcaneal eversion to promote increased pronation during gait. utilize wedging of ffot to drive increased calcaneal eversion.     Goals PT Short Term Goals PT Short Term Goal 1: Patient will increase ankle dorsiflexion to >15 degrees to decrease early heel off during walking PT Short Term Goal 1 - Progress: Progressing toward goal PT Short Term Goal 2: Patient will increase ankle plantar flexion to 40 degrees to be able to stand on tip of toes to reach to high kitchen shelf PT Short Term Goal 2 - Progress: Progressing toward goal PT Short Term Goal 3: Patient will increase knee extension to 0 degrees to be able to walk with a longer stride length PT Short Term Goal 3 - Progress: Progressing toward goal PT Short Term Goal 4: Patint will increase hip extension to ambulate with increased stride length PT Short Term Goal 4 - Progress: Progressing toward goal PT Short Term Goal 5: Patient will increase ankle inversion/eversion to 30/15 degrees to improve normal ankle AROM PT Short Term Goal 5 - Progress: Progressing toward goal  Problem List Patient Active Problem List   Diagnosis Date Noted  . HNP (herniated nucleus pulposus), cervical 06/05/2013    Class: Diagnosis of  . 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  . Muscle spasms of neck 04/04/2013  . 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 OA (osteoarthritis) of knee 02/22/2013  . Iliotibial band syndrome of left side 11/22/2012  . Localized, primary osteoarthritis of the ankle and foot 10/11/2012  . Difficulty in walking 08/17/2012  . Stiffness of  joint, not elsewhere classified, ankle and foot 08/17/2012  . Peroneal  tendinitis 08/16/2012  . Shortness of breath 02/09/2012  . Precordial pain 02/09/2012  . Essential hypertension, benign 02/09/2012  . Mixed hyperlipidemia 02/09/2012  . Dysthymia 02/04/2012  . Pain in joint, ankle and foot 12/23/2011  . Abnormality of gait 12/23/2011  . Lateral epicondylitis/tennis elbow 05/06/2011  . Trigger point of extremity 05/06/2011  . TENOSYNOVITIS OF FOOT AND ANKLE 10/22/2010       GP    Albena Comes R 08/31/2014, 9:54 AM

## 2014-09-05 ENCOUNTER — Ambulatory Visit (HOSPITAL_COMMUNITY)
Admission: RE | Admit: 2014-09-05 | Discharge: 2014-09-05 | Disposition: A | Discharge status: Home / Self Care | Payer: Medicare Other | Source: Ambulatory Visit | Attending: Orthopedic Surgery | Admitting: Orthopedic Surgery

## 2014-09-05 DIAGNOSIS — IMO0001 Reserved for inherently not codable concepts without codable children: Secondary | ICD-10-CM | POA: Diagnosis not present

## 2014-09-05 NOTE — Progress Notes (Signed)
Physical Therapy Treatment Patient Details  Name: Zachary [name] MRN: [id number] Date of Birth: 08-Jun-1946  Today's Date: 09/05/2014 Time: 0800-0850 PT Time Calculation (min): 50 min  Visit#: 20 of 23  Re-eval: 09/10/14 Authorization: Medicare  Authorization Visit#: 20 of 30  Charges:  therex [phone] (35'), manual [phone] (12')   Subjective: Symptoms/Limitations Symptoms: Pt states his pain is 3/10 today. States it stayed sore over the weekend but reports complaince with new stretch added last visit.  States it is helping. Pain Assessment Currently in Pain?: Yes Pain Score: 3  Pain Location: Ankle Pain Orientation: Left;Lateral   Exercise/Treatments Stretches Active Hamstring Stretch: 3 reps;30 seconds;Limitations Active Hamstring Stretch Limitations: 3 ways Hip Flexor Stretch: 3 reps;20 seconds;Limitations Hip Flexor Stretch Limitations: hip flexion and groin 8in step Gastroc Stretch: Limitations;2 reps;20 seconds Gastroc Stretch Limitations: slant board 3 way Soleus Stretch: Limitations;20 seconds;3 reps Soleus Stretch Limitations: 3 way 10x 3 seconds with towel under toes standing Heel Raises: Limitations Heel Raises Limitations: heel raise matrix 10x each (60 heel raises all together) Forward Lunges: Limitations Forward Lunges Limitations: 2RT anterior lateral lunge walks with therapist assisst for correct performance Functional Squat: 10 reps Functional Squat Limitations: Squat matrix 10x each Other Standing Knee Exercises: 3D ankle excursion 10x at wall and on airex 10x Other Standing Knee Exercises: 3 way knee driver with mobilization with movemnt to increase ankel dorsiflexion 10x     Manual Therapy Manual Therapy: Joint mobilization Other Manual Therapy: talocrural, subtalar, midtarsal, and great toe grade 2-3 joint mobilizations Lt foot, grade 3-4 valcaneal eversion mobilization  Physical Therapy Assessment and Plan PT Assessment:  Pt able to complete all  activities today without complaints of pain.  Added squat matrix today without UE movements with most difficulty with ER.  PT with improving ROM following joint mobilizations. Less cues needed for correct form with therex and deeper squat.   FOTO completed and Gcode updated today.  PT with 3 visits remaining.  PT Plan: continue anterior lateral lunges, emphasize groin, hip internal and external rotation mobility in addition to contineud focus on improving ankle.  Re-assess X 3 more visits.    Problem List Patient Active Problem List   Diagnosis Date Noted  . HNP (herniated nucleus pulposus), cervical 06/05/2013    Class: Diagnosis of  . 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  . Muscle spasms of neck 04/04/2013  . 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 OA (osteoarthritis) of knee 02/22/2013  . Iliotibial band syndrome of left side 11/22/2012  . Localized, primary osteoarthritis of the ankle and foot 10/11/2012  . Difficulty in walking 08/17/2012  . Stiffness of joint, not elsewhere classified, ankle and foot 08/17/2012  . Peroneal tendinitis 08/16/2012  . Shortness of breath 02/09/2012  . Precordial pain 02/09/2012  . Essential hypertension, benign 02/09/2012  . Mixed hyperlipidemia 02/09/2012  . Dysthymia 02/04/2012  . Pain in joint, ankle and foot 12/23/2011  . Abnormality of gait 12/23/2011  . Lateral epicondylitis/tennis elbow 05/06/2011  . Trigger point of extremity 05/06/2011  . TENOSYNOVITIS OF FOOT AND ANKLE 10/22/2010       GP Functional Assessment Tool Used: FOTO 58% limitation (was 52%, 54% limited) Functional Limitation: Mobility: Walking and moving around Mobility: Walking and Moving Around Current Status [phone]): At least 40 percent but less than 60 percent impaired, limited or restricted Mobility: Walking and Moving Around Goal Status [phone]): At least 20 percent but less than 40 percent impaired, limited or restricted  Teena Irani, PTA/CLT 09/05/2014, 8:53  AM

## 2014-09-07 ENCOUNTER — Ambulatory Visit (HOSPITAL_COMMUNITY)
Admission: RE | Admit: 2014-09-07 | Discharge: 2014-09-07 | Disposition: A | Discharge status: Home / Self Care | Payer: Medicare Other | Source: Ambulatory Visit | Attending: Internal Medicine | Admitting: Internal Medicine

## 2014-09-07 DIAGNOSIS — IMO0001 Reserved for inherently not codable concepts without codable children: Secondary | ICD-10-CM | POA: Diagnosis not present

## 2014-09-07 NOTE — Progress Notes (Signed)
Physical Therapy Treatment Patient Details  Name: Zachary [name] MRN: [id number] Date of Birth: 12-26-1946  Today's Date: 09/07/2014 Time: 0800-0845 PT Time Calculation (min): 45 min   Charges: TE [phone], Manual [phone] Visit#: 21 of 23  Re-eval: 09/10/14 Assessment Diagnosis: Lt foot stiffness secondary to pain and aquired cavorvaus deformity of Lt foot.  Next MD Visit: Doran Durand 3 months Prior Therapy: no  Authorization: Medicare  Authorization Time Period: Gcode complete 20th visit  Authorization Visit#: 21 of 30   Subjective: Symptoms/Limitations Symptoms: Patient had a lon evening last night due to workign at the local [employer] foot ball game that was delayed due to rain delay.  Pain Assessment Currently in Pain?: Yes Pain Score: 7  Pain Location: Ankle Pain Orientation: Left;Lateral Pain Type: Chronic pain  Precautions/Restrictions     Exercise/Treatments Mobility/Balance        Stretches Active Hamstring Stretch: 3 reps;Limitations;20 seconds Active Hamstring Stretch Limitations: 3 ways Hip Flexor Stretch: 3 reps;20 seconds;Limitations Hip Flexor Stretch Limitations: hip flexion and groin 8in step Gastroc Stretch: Limitations;2 reps;20 seconds Gastroc Stretch Limitations: slant board 3 way with lateral heel wedge Soleus Stretch: Limitations;20 seconds;3 reps Soleus Stretch Limitations: 3 way 10x 3 seconds with towel under toes,with lateral heel wedge Standing Heel Raises: Limitations Heel Raises Limitations: heel raise matrix 10x each (60 heel raises all together) Forward Lunges: Limitations Forward Lunges Limitations: 2RT anterior lateral lunge walks with therapist assisst for correct performance Functional Squat Limitations: Squat matrix 5x each SLS: Single leg balance reach common and uncommon with slider and cane 5x Other Standing Knee Exercises: 3D ankle excursion 10x with lateral heel wedge10x Other Standing Knee Exercises: BAPS lvl 2 backweards to  increase ankle eversion  Manual therapy: Lt ankle and calcaneal eversion mobilizations  Physical Therapy Assessment and Plan PT Assessment and Plan Clinical Impression Statement: Session focused on increasing Lt ankle mobility to improve calcaneal eversion and subsequently improve transition zone 1 of gait to increase shock absobrtion mechanisms during gait. Paqtient noted improved pain follwoing soleus and gastroc stretches with lateral heel wedging to increase calcaneal eversion. Manual therapy performedresulting in minimal improvement in calcaneal eversion.  PT Plan: FOcus on increasing calcaneal eversion via joint mobilization, and stretches/exercises perfromed with a lateral heel wedge.     Goals PT Short Term Goals PT Short Term Goal 1: Patient will increase ankle dorsiflexion to >15 degrees to decrease early heel off during walking PT Short Term Goal 1 - Progress: Progressing toward goal PT Short Term Goal 2: Patient will increase ankle plantar flexion to 40 degrees to be able to stand on tip of toes to reach to high kitchen shelf PT Short Term Goal 2 - Progress: Progressing toward goal PT Short Term Goal 3: Patient will increase knee extension to 0 degrees to be able to walk with a longer stride length PT Short Term Goal 3 - Progress: Progressing toward goal PT Short Term Goal 4: Patint will increase hip extension to ambulate with increased stride length PT Short Term Goal 4 - Progress: Progressing toward goal PT Short Term Goal 5: Patient will increase ankle inversion/eversion to 30/15 degrees to improve normal ankle AROM PT Short Term Goal 5 - Progress: Progressing toward goal PT Long Term Goals PT Long Term Goal 1: Patient will increase great toe dorsiflextion to 40 degree to decrease early toe off durign gait PT Long Term Goal 1 - Progress: Progressing toward goal PT Long Term Goal 2: Patient will increase hip internal rotation to 30  degrees to increase ability to absorb impact of  walking PT Long Term Goal 2 - Progress: Progressing toward goal Long Term Goal 3: Patient will be able to perform sit to stand 5x in less than 15seconds without UE support indicatign improved hip strength and patient not at high risk of falls.  Long Term Goal 3 Progress: Met Long Term Goal 4: Patient will be able to walk 30 minutes with pain less than 3/10 Long Term Goal 4 Progress: Progressing toward goal  Problem List Patient Active Problem List   Diagnosis Date Noted  . HNP (herniated nucleus pulposus), cervical 06/05/2013    Class: Diagnosis of  . 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  . Muscle spasms of neck 04/04/2013  . 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 OA (osteoarthritis) of knee 02/22/2013  . Iliotibial band syndrome of left side 11/22/2012  . Localized, primary osteoarthritis of the ankle and foot 10/11/2012  . Difficulty in walking 08/17/2012  . Stiffness of joint, not elsewhere classified, ankle and foot 08/17/2012  . Peroneal tendinitis 08/16/2012  . Shortness of breath 02/09/2012  . Precordial pain 02/09/2012  . Essential hypertension, benign 02/09/2012  . Mixed hyperlipidemia 02/09/2012  . Dysthymia 02/04/2012  . Pain in joint, ankle and foot 12/23/2011  . Abnormality of gait 12/23/2011  . Lateral epicondylitis/tennis elbow 05/06/2011  . Trigger point of extremity 05/06/2011  . TENOSYNOVITIS OF FOOT AND ANKLE 10/22/2010    PT - End of Session Activity Tolerance: Patient tolerated treatment well General Behavior During Therapy: WFL for tasks assessed/performed  GP Functional Assessment Tool Used: FOTO 58% limitation (was 52%, 54% limited) Functional Limitation: Mobility: Walking and moving around Mobility: Walking and Moving Around Current Status (O2518): At least 40 percent but less than 60 percent impaired, limited or restricted Mobility: Walking and Moving Around Goal Status [id number]): At least 20 percent but less than 40 percent impaired, limited  or restricted  Mechele Kittleson R 09/07/2014, 8:54 AM

## 2014-09-10 ENCOUNTER — Ambulatory Visit (HOSPITAL_COMMUNITY)
Admission: RE | Admit: 2014-09-10 | Discharge: 2014-09-10 | Disposition: A | Discharge status: Home / Self Care | Payer: Medicare Other | Source: Ambulatory Visit | Attending: Orthopedic Surgery | Admitting: Orthopedic Surgery

## 2014-09-10 DIAGNOSIS — IMO0001 Reserved for inherently not codable concepts without codable children: Secondary | ICD-10-CM | POA: Diagnosis not present

## 2014-09-10 NOTE — Progress Notes (Signed)
Physical Therapy Treatment Patient Details  Name: Zachary [name] MRN: [id number] Date of Birth: 1946-12-25  Today's Date: 09/10/2014 Time: 0850-0930 PT Time Calculation (min): 40 min Visit#: 22 of 23  Re-eval: 09/10/14 Authorization: Medicare  Authorization Time Period: Dareen Piano complete 20th visit  Authorization Visit#: 22 of 30  Charges:  therex [phone] (30') manual [phone] (10')   Subjective: Symptoms/Limitations Symptoms: Pt states his Lt knee has been more sore than his ankle.  States he currently has 2/10 pain in his Lt ankle and didn't have any pain when he initially woke this morning. Pain Assessment Currently in Pain?: Yes Pain Score: 2  Pain Location: Ankle Pain Orientation: Left;Lateral    Exercise/Treatments Stretches Active Hamstring Stretch: 3 reps;Limitations;20 seconds Active Hamstring Stretch Limitations: 3 ways Gastroc Stretch: Limitations;2 reps;20 seconds Gastroc Stretch Limitations: slant board 3 way with lateral heel wedge Soleus Stretch: Limitations;20 seconds;3 reps Soleus Stretch Limitations: 3 way 10x 3 seconds with towel under toes,with lateral heel wedge Standing Heel Raises: Limitations Heel Raises Limitations: heel raise matrix 10x each (60 heel raises all together) Forward Lunges: Limitations Forward Lunges Limitations: 2RT anterior lateral lunge walks with therapist assisst for correct performance Functional Squat Limitations: Squat matrix 5x each Other Standing Knee Exercises: 3D ankle excursion 10x with lateral heel wedge10x Other Standing Knee Exercises: BAPS lvl 2 backwards to increase ankle eversion   Manual Therapy Manual Therapy: Other (comment) Other Manual Therapy: Lt ankle and calcaneal eversion mobilizations  Physical Therapy Assessment and Plan PT Assessment and Plan Clinical Impression Statement: Continued focus on increasing Lt ankle mobility.  No pain reported at end of session with overall pain reduction voiced at beginning  of session.  Improved mobility with manual, however eversion most limited.  PT reports he is debating surgery.  PT Plan: Re-eval next session.     Problem List Patient Active Problem List   Diagnosis Date Noted  . HNP (herniated nucleus pulposus), cervical 06/05/2013    Class: Diagnosis of  . 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  . Muscle spasms of neck 04/04/2013  . 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 OA (osteoarthritis) of knee 02/22/2013  . Iliotibial band syndrome of left side 11/22/2012  . Localized, primary osteoarthritis of the ankle and foot 10/11/2012  . Difficulty in walking 08/17/2012  . Stiffness of joint, not elsewhere classified, ankle and foot 08/17/2012  . Peroneal tendinitis 08/16/2012  . Shortness of breath 02/09/2012  . Precordial pain 02/09/2012  . Essential hypertension, benign 02/09/2012  . Mixed hyperlipidemia 02/09/2012  . Dysthymia 02/04/2012  . Pain in joint, ankle and foot 12/23/2011  . Abnormality of gait 12/23/2011  . Lateral epicondylitis/tennis elbow 05/06/2011  . Trigger point of extremity 05/06/2011  . TENOSYNOVITIS OF FOOT AND ANKLE 10/22/2010    General Behavior During Therapy: [facility] for tasks assessed/performed  GP Functional Limitation: Mobility: Walking and moving around  [employer], PTA/CLT 09/10/2014, 10:28 AM

## 2014-09-11 DIAGNOSIS — H31009 Unspecified chorioretinal scars, unspecified eye: Secondary | ICD-10-CM | POA: Diagnosis not present

## 2014-09-13 ENCOUNTER — Ambulatory Visit (HOSPITAL_COMMUNITY)
Admission: RE | Admit: 2014-09-13 | Discharge: 2014-09-13 | Disposition: A | Discharge status: Home / Self Care | Payer: Medicare Other | Source: Ambulatory Visit | Attending: Internal Medicine | Admitting: Internal Medicine

## 2014-09-13 DIAGNOSIS — IMO0001 Reserved for inherently not codable concepts without codable children: Secondary | ICD-10-CM | POA: Diagnosis not present
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
## 2014-09-17 ENCOUNTER — Ambulatory Visit (HOSPITAL_COMMUNITY)
Admission: RE | Admit: 2014-09-17 | Discharge: 2014-09-17 | Disposition: A | Discharge status: Home / Self Care | Payer: Medicare Other | Source: Ambulatory Visit | Attending: Orthopedic Surgery | Admitting: Orthopedic Surgery

## 2014-09-17 DIAGNOSIS — IMO0001 Reserved for inherently not codable concepts without codable children: Secondary | ICD-10-CM | POA: Diagnosis not present

## 2014-09-17 NOTE — Progress Notes (Signed)
Physical Therapy Treatment Patient Details  Name: Zachary [name] MRN: [id number] Date of Birth: Jan 12, 1946  Today's Date: 09/17/2014 Time: 0800-0850 PT Time Calculation (min): 50 min Visit#: 24 of 30  Re-eval: 09/10/14 Authorization: Medicare  Authorization Time Period: Dareen Piano complete 23th visit  Authorization Visit#: 24 of 33  Charges: therex [phone] (36'), manual [phone] (14')  Subjective: Symptoms/Limitations Symptoms: Pt states he is doing OK this morning.  States his ankle is still hurting with dull pain. Pain Assessment Currently in Pain?: Yes Pain Score: 2  Pain Location: Ankle   Exercise/Treatments Stretches Active Hamstring Stretch: 3 reps;Limitations;20 seconds Active Hamstring Stretch Limitations: 3 ways Gastroc Stretch: Limitations;2 reps;20 seconds Gastroc Stretch Limitations: slant board 3 way with lateral heel wedge Soleus Stretch: Limitations;20 seconds;3 reps Soleus Stretch Limitations: 3 way 10x 3 seconds with towel under toes,with lateral heel wedge Standing Heel Raises: Limitations Heel Raises Limitations: heel raise matrix 10x each (60 heel raises all together) Forward Lunges: Limitations Forward Lunges Limitations: 2RT anterior lateral lunge walks with therapist assisst for correct performance Other Standing Knee Exercises: 3D ankle excursion 10x with lateral heel wedge10x, and on airex 10x Other Standing Knee Exercises: 3 way flamengo 5x    Manual Therapy Manual Therapy: Joint mobilization Joint Mobilization: Lt Hip internal rotation mobilization and Lt ankle/calcaneal eversion mobilizations.  Quad stretch PROM in prone  Physical Therapy Assessment and Plan PT Assessment and Plan Clinical Impression Statement: Added flamingo 3 way activity this session with noted instability/weakness. Extreme tightness in Lt quadricep musculature with improved mobility following PROM.  Pt continues to complete HEP PT Plan: Continue to focus on Increasing calcaneal  eversion, forefoot pronation, hip internal rotation and overall ankle mobility via stretching and manual therapy, as well as progressing dynamic ankle with specific focus on strengthening peroneal tendons. Introduce single leg balance reach matrix next session.    Problem List Patient Active Problem List   Diagnosis Date Noted  . HNP (herniated nucleus pulposus), cervical 06/05/2013    Class: Diagnosis of  . 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  . Muscle spasms of neck 04/04/2013  . 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 OA (osteoarthritis) of knee 02/22/2013  . Iliotibial band syndrome of left side 11/22/2012  . Localized, primary osteoarthritis of the ankle and foot 10/11/2012  . Difficulty in walking 08/17/2012  . Stiffness of joint, not elsewhere classified, ankle and foot 08/17/2012  . Peroneal tendinitis 08/16/2012  . Shortness of breath 02/09/2012  . Precordial pain 02/09/2012  . Essential hypertension, benign 02/09/2012  . Mixed hyperlipidemia 02/09/2012  . Dysthymia 02/04/2012  . Pain in joint, ankle and foot 12/23/2011  . Abnormality of gait 12/23/2011  . Lateral epicondylitis/tennis elbow 05/06/2011  . Trigger point of extremity 05/06/2011  . TENOSYNOVITIS OF FOOT AND ANKLE 10/22/2010    PT - End of Session Activity Tolerance: Patient tolerated treatment well General Behavior During Therapy: [facility] for tasks assessed/performed  GP    Teena Irani, PTA/CLT 09/17/2014, 9:14 AM

## 2014-09-24 ENCOUNTER — Ambulatory Visit (HOSPITAL_COMMUNITY)
Admission: RE | Admit: 2014-09-24 | Discharge: 2014-09-24 | Disposition: A | Discharge status: Home / Self Care | Payer: Medicare Other | Source: Ambulatory Visit | Attending: Orthopedic Surgery | Admitting: Orthopedic Surgery

## 2014-09-24 DIAGNOSIS — IMO0001 Reserved for inherently not codable concepts without codable children: Secondary | ICD-10-CM | POA: Diagnosis not present

## 2014-09-24 NOTE — Progress Notes (Signed)
Physical Therapy Treatment Patient Details  Name: Zachary [name] MRN: [id number] Date of Birth: 1946/11/15  Today's Date: 09/24/2014 Time: 0802-0848 PT Time Calculation (min): 46 min  Visit#: 25 of 30  Re-eval: 09/10/14 Authorization: Medicare  Authorization Time Period: Dareen Piano complete 23th visit  Authorization Visit#: 25 of 33  Charges:  therex [phone] (36'), manual [phone] (10')  Subjective: Symptoms/Limitations Symptoms: Pt states he's doing OK.  Thinking about having surgery first of the year.  Currently with 2/10 dull pain. Pain Assessment Currently in Pain?: Yes Pain Score: 2  Pain Location: Ankle Pain Orientation: Left;Lateral   Exercise/Treatments Stretches Active Hamstring Stretch: 3 reps;Limitations;20 seconds Active Hamstring Stretch Limitations: 3 ways Hip Flexor Stretch: 3 reps;20 seconds;Limitations Hip Flexor Stretch Limitations: hip flexion 14in step Gastroc Stretch: Limitations;2 reps;20 seconds Gastroc Stretch Limitations: slant board 3 way with lateral heel wedge Soleus Stretch: Limitations;20 seconds;3 reps Soleus Stretch Limitations: 3 way 10x 3 seconds with towel under toes,with lateral heel wedge Standing Other Standing Knee Exercises: 3D ankle excursion 10x with lateral heel wedge10x, and on airex 10x Other Standing Knee Exercises: 3 way flamengo 5x  Manual Therapy Joint Mobilization: Lt Hip internal rotation mobilization and Lt ankle/calcaneal eversion mobilizations. Quad stretch PROM in prone  Physical Therapy Assessment and Plan PT Assessment and Plan Clinical Impression Statement: Pt with imrproving ankle mobility.  Quadricep and hip rotators continue to be stiff requiring manual techniques to increase ROM.  Pt encouraged to perform self mobilty.  PT Plan: Continue to focus on Increasing calcaneal eversion, forefoot pronation, hip internal rotation and overall ankle mobility via stretching and manual therapy, as well as progressing dynamic ankle  with specific focus on strengthening peroneal tendons. Introduce single leg balance reach matrix next session.     Problem List Patient Active Problem List   Diagnosis Date Noted  . HNP (herniated nucleus pulposus), cervical 06/05/2013    Class: Diagnosis of  . 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  . Muscle spasms of neck 04/04/2013  . 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 OA (osteoarthritis) of knee 02/22/2013  . Iliotibial band syndrome of left side 11/22/2012  . Localized, primary osteoarthritis of the ankle and foot 10/11/2012  . Difficulty in walking 08/17/2012  . Stiffness of joint, not elsewhere classified, ankle and foot 08/17/2012  . Peroneal tendinitis 08/16/2012  . Shortness of breath 02/09/2012  . Precordial pain 02/09/2012  . Essential hypertension, benign 02/09/2012  . Mixed hyperlipidemia 02/09/2012  . Dysthymia 02/04/2012  . Pain in joint, ankle and foot 12/23/2011  . Abnormality of gait 12/23/2011  . Lateral epicondylitis/tennis elbow 05/06/2011  . Trigger point of extremity 05/06/2011  . TENOSYNOVITIS OF FOOT AND ANKLE 10/22/2010    PT - End of Session Activity Tolerance: Patient tolerated treatment well General Behavior During Therapy: [facility] for tasks assessed/performed  GP    Teena Irani, PTA/CLT 09/24/2014, 8:54 AM

## 2014-09-26 ENCOUNTER — Ambulatory Visit (HOSPITAL_COMMUNITY)
Admission: RE | Admit: 2014-09-26 | Discharge: 2014-09-26 | Disposition: A | Discharge status: Home / Self Care | Payer: Medicare Other | Source: Ambulatory Visit | Attending: Internal Medicine | Admitting: Internal Medicine

## 2014-09-26 DIAGNOSIS — IMO0001 Reserved for inherently not codable concepts without codable children: Secondary | ICD-10-CM | POA: Diagnosis not present

## 2014-09-26 NOTE — Progress Notes (Signed)
Physical Therapy Treatment Patient Details  Name: Zachary [name] MRN: [id number] Date of Birth: [birth year]  Today's Date: 09/26/2014 Time: 0802-0850 PT Time Calculation (min): 48 min Visit#: 26 of 30  Re-eval: 09/10/14 Authorization: Medicare  Authorization Time Period: Dareen Piano complete 23th visit  Authorization Visit#: 26 of 33  Charges:  therex [phone] (36'), manual [phone] (12')  Subjective: Symptoms/Limitations Symptoms: Pt states he is really stiff in the mornings but feels better after doing stretches.  Currently 2/10 Pain Assessment Currently in Pain?: Yes Pain Score: 2  Pain Location: Ankle Pain Orientation: Left;Lateral   Exercise/Treatments Stretches Active Hamstring Stretch: 3 reps;Limitations;20 seconds Active Hamstring Stretch Limitations: 3 ways Gastroc Stretch: Limitations;2 reps;20 seconds Gastroc Stretch Limitations: slant board 3 way with lateral heel wedge Soleus Stretch: Limitations;20 seconds;3 reps Soleus Stretch Limitations: 3 way 10x 3 seconds with towel under toes,with lateral heel wedge Standing Heel Raises: Limitations Heel Raises Limitations: heel raise matrix 10x each (60 heel raises all together) Forward Lunges: Limitations Forward Lunges Limitations: 2RT anterior lateral lunge walks with therapist assisst for correct performance Functional Squat Limitations: Lt single leg balance reach matrix 5X 2D Other Standing Knee Exercises: 3D ankle excursion 10x with lateral heel wedge10x, and on airex 10x Other Standing Knee Exercises: 3 way flamengo 5x Lt    Manual Therapy Manual Therapy: Other (comment) Other Manual Therapy: Lt Hip internal rotation mobilization and Lt ankle/calcaneal eversion mobilizations. Quad stretch PROM in prone  Physical Therapy Assessment and Plan PT Assessment and Plan Clinical Impression Statement: Improving quadricep ROM as noted with PROM today.  Pt independent with established HEP and stretches.  Instructed with single  leg balance matrix with patient requiring UE assistance.  Flamingo exercise continues to be difficult for patient as well.   PT Plan: Continue to focus on Increasing calcaneal eversion, forefoot pronation, hip internal rotation and overall ankle mobility via stretching and manual therapy, as well as progressing dynamic ankle with specific focus on strengthening peroneal tendons. Introduce single leg balance reach matrix next session.     Problem List Patient Active Problem List   Diagnosis Date Noted  . HNP (herniated nucleus pulposus), cervical 06/05/2013    Class: Diagnosis of  . 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  . Muscle spasms of neck 04/04/2013  . 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 OA (osteoarthritis) of knee 02/22/2013  . Iliotibial band syndrome of left side 11/22/2012  . Localized, primary osteoarthritis of the ankle and foot 10/11/2012  . Difficulty in walking 08/17/2012  . Stiffness of joint, not elsewhere classified, ankle and foot 08/17/2012  . Peroneal tendinitis 08/16/2012  . Shortness of breath 02/09/2012  . Precordial pain 02/09/2012  . Essential hypertension, benign 02/09/2012  . Mixed hyperlipidemia 02/09/2012  . Dysthymia 02/04/2012  . Pain in joint, ankle and foot 12/23/2011  . Abnormality of gait 12/23/2011  . Lateral epicondylitis/tennis elbow 05/06/2011  . Trigger point of extremity 05/06/2011  . TENOSYNOVITIS OF FOOT AND ANKLE 10/22/2010    PT - End of Session Activity Tolerance: Patient tolerated treatment well General Behavior During Therapy: [facility] for tasks assessed/performed  GP    Teena Irani, PTA/CLT 09/26/2014, 9:52 AM

## 2014-09-27 DIAGNOSIS — H3320 Serous retinal detachment, unspecified eye: Secondary | ICD-10-CM | POA: Diagnosis not present

## 2014-09-27 DIAGNOSIS — H33009 Unspecified retinal detachment with retinal break, unspecified eye: Secondary | ICD-10-CM | POA: Diagnosis not present

## 2014-09-27 DIAGNOSIS — H338 Other retinal detachments: Secondary | ICD-10-CM | POA: Diagnosis not present

## 2014-09-28 DIAGNOSIS — I10 Essential (primary) hypertension: Secondary | ICD-10-CM | POA: Diagnosis not present

## 2014-09-28 DIAGNOSIS — E119 Type 2 diabetes mellitus without complications: Secondary | ICD-10-CM | POA: Diagnosis not present

## 2014-10-01 ENCOUNTER — Telehealth (HOSPITAL_COMMUNITY): Payer: Self-pay

## 2014-10-01 NOTE — Telephone Encounter (Signed)
cx - getting blood work done

## 2014-10-02 ENCOUNTER — Ambulatory Visit (HOSPITAL_COMMUNITY): Payer: Self-pay

## 2014-10-02 DIAGNOSIS — R7301 Impaired fasting glucose: Secondary | ICD-10-CM | POA: Diagnosis not present

## 2014-10-02 DIAGNOSIS — E782 Mixed hyperlipidemia: Secondary | ICD-10-CM | POA: Diagnosis not present

## 2014-10-02 DIAGNOSIS — I10 Essential (primary) hypertension: Secondary | ICD-10-CM | POA: Diagnosis not present

## 2014-10-04 ENCOUNTER — Ambulatory Visit (HOSPITAL_COMMUNITY)
Admission: RE | Admit: 2014-10-04 | Discharge: 2014-10-04 | Disposition: A | Discharge status: Home / Self Care | Payer: Medicare Other | Source: Ambulatory Visit | Attending: Internal Medicine | Admitting: Internal Medicine

## 2014-10-04 DIAGNOSIS — Z5189 Encounter for other specified aftercare: Secondary | ICD-10-CM | POA: Diagnosis not present

## 2014-10-04 DIAGNOSIS — M25675 Stiffness of left foot, not elsewhere classified: Secondary | ICD-10-CM | POA: Diagnosis not present

## 2014-10-04 DIAGNOSIS — Q661 Congenital talipes calcaneovarus: Secondary | ICD-10-CM | POA: Diagnosis not present

## 2014-10-04 DIAGNOSIS — M79672 Pain in left foot: Secondary | ICD-10-CM | POA: Insufficient documentation

## 2014-10-04 NOTE — Progress Notes (Signed)
Physical Therapy Treatment Patient Details  Name: Zachary [name] MRN: [id number] Date of Birth: 01/22/1946  Today's Date: 10/04/2014 Time: 0802-0845 PT Time Calculation (min): 43 min   Charges: TE [phone], manual [phone] Visit#: 27 of 30  Re-eval: 10/13/14 Assessment Diagnosis: Lt foot stiffness secondary to pain and aquired cavorvaus deformity of Lt foot.  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  Pain Assessment Currently in Pain?: Yes Pain Score: 2   Exercise/Treatments Stretches Gastroc Stretch Limitations: 3 way 10x 3"  Soleus Stretch: Limitations;20 seconds;3 reps Soleus Stretch Limitations: 3 way 10x 3 seconds with towel under toes Standing Forward Lunges Limitations: 2RT anterior lateral lunge walks with therapist assisst for correct performance, Anterior lunges to lateral heel wedge with therapist functional manual reactions and mobilization with movement to increase talar internal rotation and anterior to posterior translation Functional Squat Limitations: Squat on BOSU 10x each, signmificant difficulty noted indicatign patient not ready for multiplanar squats on BOSU Other Standing Knee Exercises: 3D ankle excursion 10x with lateral heel wedge10x, and on airex 10x Other Standing Knee Exercises: 2 way flamengo 5x Lt (no same side rotation)   Manual Therapy Manual Therapy: Myofascial release Joint Mobilization: Lt Hip internal rotation mobilization and Lt ankle/calcaneal eversion mobilizations, mid and forefoot wrining to increase pronation.  Myofascial Release: plantar fascia myofascial release  Physical Therapy Assessment and Plan PT Assessment and  Plan Clinical Impression Statement: Session focused on spedcific exercises to drive increased calcaneal eversion, increased hip internal rotation, and improved overall ankle mobility to improve deceleration mechnics during gait with patient displaying decreased pain and noting further improvement following manual therapy to promore timproved ankle mobility and improved plantar fascia mobility.  PT Plan: Increasing calcaneal eversion, forefoot pronation, hip internal rotation and overall ankle mobility via stretching and manual therapy, as well as progressing dynamic ankle with specific focus on strengthening peroneal tendons. Introduce single leg balance reach matrix next session.     Goals PT Short Term Goals PT Short Term Goal 3: Patient will increase knee extension to 0 degrees to be able to walk with a longer stride length PT Short Term Goal 3 - Progress: Progressing toward goal PT Long Term Goals Long Term Goal 4: Patient will be able to walk 30 minutes with pain less than 3/10 Long Term Goal 4 Progress: Progressing toward goal PT Long Term Goal 5: progress ankle plantar flexion strength to 4/5 MMT Long Term Goal 5 Progress: Progressing toward goal  Problem List Patient Active Problem List   Diagnosis Date Noted  . HNP (herniated nucleus pulposus), cervical 06/05/2013    Class: Diagnosis of  . 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  . Muscle spasms of neck 04/04/2013  . 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 OA (osteoarthritis) of knee 02/22/2013  . Iliotibial band syndrome of left side 11/22/2012  . Localized, primary osteoarthritis of the ankle and foot 10/11/2012  . Difficulty in walking 08/17/2012  . Stiffness of joint, not elsewhere classified, ankle and foot 08/17/2012  . Peroneal tendinitis 08/16/2012  . Shortness of breath 02/09/2012  . Precordial pain 02/09/2012  . Essential hypertension, benign 02/09/2012  . Mixed hyperlipidemia 02/09/2012  . Dysthymia 02/04/2012   .  Pain in joint, ankle and foot 12/23/2011  . Abnormality of gait 12/23/2011  . Lateral epicondylitis/tennis elbow 05/06/2011  . Trigger point of extremity 05/06/2011  . TENOSYNOVITIS OF FOOT AND ANKLE 10/22/2010    PT - End of Session Activity Tolerance: Patient tolerated treatment well General Behavior During Therapy: [facility] for tasks assessed/performed  GP    Tenesha Garza R 10/04/2014, 9:38 AM

## 2014-10-09 ENCOUNTER — Ambulatory Visit (HOSPITAL_COMMUNITY)
Admission: RE | Admit: 2014-10-09 | Discharge: 2014-10-09 | Disposition: A | Discharge status: Home / Self Care | Payer: Medicare Other | Source: Ambulatory Visit | Attending: Internal Medicine | Admitting: Internal Medicine

## 2014-10-09 DIAGNOSIS — M79672 Pain in left foot: Secondary | ICD-10-CM | POA: Diagnosis not present

## 2014-10-09 DIAGNOSIS — M25675 Stiffness of left foot, not elsewhere classified: Secondary | ICD-10-CM | POA: Diagnosis not present

## 2014-10-09 DIAGNOSIS — Q661 Congenital talipes calcaneovarus: Secondary | ICD-10-CM | POA: Diagnosis not present

## 2014-10-09 DIAGNOSIS — Z5189 Encounter for other specified aftercare: Secondary | ICD-10-CM | POA: Diagnosis not present

## 2014-10-09 NOTE — Progress Notes (Signed)
Physical Therapy Treatment Patient Details  Name: Zachary [name] MRN: [id number] Date of Birth: 22-Mar-1946  Today's Date: 10/09/2014 Time: 7322-0254 PT Time Calculation (min): 43 min Charge: TE [id number]  Visit#: 28 of 30  Re-eval: 10/13/14 Assessment Diagnosis: Lt foot stiffness secondary to pain and aquired cavorvaus deformity of Lt foot.  Next MD Visit: Renaldo Harrison Prior Therapy: no  Authorization: Medicare  Authorization Time Period: Gcode complete 23th visit  Authorization Visit#: 28 of 33   Subjective: Symptoms/Limitations Symptoms: Pt stated he is pain free today, has been doing stretches for relief when on football field Pain Assessment Currently in Pain?: No/denies  Objective:  Exercise/Treatments Stretches Gastroc Stretch: Limitations Gastroc Stretch Limitations: 3 way 10x 3"  Soleus Stretch: Limitations Soleus Stretch Limitations: 3 way 10x 3 seconds with towel under toes Standing Heel Raises: 10 reps;5 seconds;Limitations Heel Raises Limitations: heel raises in neutral, IR, ER, split stance Forward Lunges Limitations: 2RT anterior lateral lunge walks with therapist assisst for correct performance, Anterior lunges to lateral heel wedge with therapist functional manual reactions and mobilization with movement to increase talar internal rotation and anterior to posterior translation Functional Squat Limitations: Squat on BOSU 10x each, signmificant difficulty noted indicatign patient not ready for multiplanar squats on BOSU SLS: single leg reach matrix common and uncommon Other Standing Knee Exercises: 3D ankle excursion 10x with lateral heel wedge10x, and on airex 10x    Physical Therapy Assessment and Plan PT Assessment and Plan Clinical Impression Statement: Began single leg reach matrix to improve ankle stability and ROM with lateral support of wedge for increased eversion and  pain control.  Progressed gastroc strength with different planes  forstrengthening in muliple planes.  No reports of increased pain through session.   PT Plan: Increasing calcaneal eversion, forefoot pronation, hip internal rotation and overall ankle mobility via stretching and manual therapy, as well as progressing dynamic ankle with specific focus on strengthening peroneal tendons. Introduce single leg balance reach matrix next session.     Goals PT Short Term Goals PT Short Term Goal 1: Patient will increase ankle dorsiflexion to >15 degrees to decrease early heel off during walking PT Short Term Goal 1 - Progress: Progressing toward goal PT Short Term Goal 2: Patient will increase ankle plantar flexion to 40 degrees to be able to stand on tip of toes to reach to high kitchen shelf PT Short Term Goal 3: Patient will increase knee extension to 0 degrees to be able to walk with a longer stride length PT Short Term Goal 4: Patint will increase hip extension to ambulate with increased stride length PT Short Term Goal 5: Patient will increase ankle inversion/eversion to 30/15 degrees to improve normal ankle AROM PT Long Term Goals PT Long Term Goal 1: Patient will increase great toe dorsiflextion to 40 degree to decrease early toe off durign gait PT Long Term Goal 1 - Progress: Progressing toward goal PT Long Term Goal 2: Patient will increase hip internal rotation to 30 degrees to increase ability to absorb impact of walking Long Term Goal 3: Patient will be able to perform sit to stand 5x in less than 15seconds without UE support indicatign improved hip strength and patient not at high risk of falls.  Long Term Goal 4: Patient will be able to walk 30 minutes with pain less than 3/10 Long Term Goal 4 Progress: Progressing toward goal PT Long Term Goal 5: progress ankle plantar flexion strength to 4/5 MMT Long Term Goal 5 Progress: Progressing  toward goal  Problem List Patient Active Problem List   Diagnosis Date Noted  . HNP (herniated nucleus pulposus),  cervical 06/05/2013    Class: Diagnosis of  . 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  . Muscle spasms of neck 04/04/2013  . 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 OA (osteoarthritis) of knee 02/22/2013  . Iliotibial band syndrome of left side 11/22/2012  . Localized, primary osteoarthritis of the ankle and foot 10/11/2012  . Difficulty in walking 08/17/2012  . Stiffness of joint, not elsewhere classified, ankle and foot 08/17/2012  . Peroneal tendinitis 08/16/2012  . Shortness of breath 02/09/2012  . Precordial pain 02/09/2012  . Essential hypertension, benign 02/09/2012  . Mixed hyperlipidemia 02/09/2012  . Dysthymia 02/04/2012  . Pain in joint, ankle and foot 12/23/2011  . Abnormality of gait 12/23/2011  . Lateral epicondylitis/tennis elbow 05/06/2011  . Trigger point of extremity 05/06/2011  . TENOSYNOVITIS OF FOOT AND ANKLE 10/22/2010    PT - End of Session Activity Tolerance: Patient tolerated treatment well General Behavior During Therapy: [facility] for tasks assessed/performed  GP    Aldona Lento 10/09/2014, 8:45 AM

## 2014-10-12 ENCOUNTER — Ambulatory Visit (HOSPITAL_COMMUNITY)
Admission: RE | Admit: 2014-10-12 | Discharge: 2014-10-12 | Disposition: A | Discharge status: Home / Self Care | Payer: Medicare Other | Source: Ambulatory Visit | Attending: Orthopedic Surgery | Admitting: Orthopedic Surgery

## 2014-10-12 DIAGNOSIS — M79672 Pain in left foot: Secondary | ICD-10-CM | POA: Diagnosis not present

## 2014-10-12 DIAGNOSIS — Q661 Congenital talipes calcaneovarus: Secondary | ICD-10-CM | POA: Diagnosis not present

## 2014-10-12 DIAGNOSIS — Z5189 Encounter for other specified aftercare: Secondary | ICD-10-CM | POA: Diagnosis not present

## 2014-10-12 DIAGNOSIS — M25675 Stiffness of left foot, not elsewhere classified: Secondary | ICD-10-CM | POA: Diagnosis not present
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
## 2014-11-05 DIAGNOSIS — S86312D Strain of muscle(s) and tendon(s) of peroneal muscle group at lower leg level, left leg, subsequent encounter: Secondary | ICD-10-CM | POA: Diagnosis not present

## 2014-11-16 ENCOUNTER — Encounter (HOSPITAL_COMMUNITY): Payer: Self-pay | Admitting: Psychiatry

## 2014-11-16 ENCOUNTER — Ambulatory Visit (INDEPENDENT_AMBULATORY_CARE_PROVIDER_SITE_OTHER): Payer: Medicare Other | Admitting: Psychiatry

## 2014-11-16 VITALS — BP 153/86 | HR 84 | Ht 72.0 in | Wt 245.0 lb

## 2014-11-16 DIAGNOSIS — F329 Major depressive disorder, single episode, unspecified: Secondary | ICD-10-CM

## 2014-11-16 DIAGNOSIS — F320 Major depressive disorder, single episode, mild: Secondary | ICD-10-CM

## 2014-11-16 MED ORDER — DIAZEPAM 5 MG PO TABS: 5.0000 mg | ORAL_TABLET | Freq: Two times a day (BID) | ORAL | Status: DC | PRN

## 2014-11-16 MED ORDER — ESCITALOPRAM OXALATE 10 MG PO TABS: 10.0000 mg | ORAL_TABLET | Freq: Every day | ORAL | Status: DC
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
## 2014-12-03 DIAGNOSIS — H35372 Puckering of macula, left eye: Secondary | ICD-10-CM | POA: Diagnosis not present

## 2014-12-27 ENCOUNTER — Ambulatory Visit (HOSPITAL_BASED_OUTPATIENT_CLINIC_OR_DEPARTMENT_OTHER): Admission: RE | Admit: 2014-12-27 | Payer: 59 | Source: Ambulatory Visit | Admitting: Orthopedic Surgery

## 2014-12-27 ENCOUNTER — Encounter (HOSPITAL_BASED_OUTPATIENT_CLINIC_OR_DEPARTMENT_OTHER): Admission: RE | Payer: Self-pay | Source: Ambulatory Visit

## 2014-12-27 SURGERY — TENDON REPAIR
Anesthesia: General | Laterality: Left

## 2014-12-31 DIAGNOSIS — N401 Enlarged prostate with lower urinary tract symptoms: Secondary | ICD-10-CM | POA: Diagnosis not present

## 2014-12-31 DIAGNOSIS — I10 Essential (primary) hypertension: Secondary | ICD-10-CM | POA: Diagnosis not present

## 2014-12-31 DIAGNOSIS — E119 Type 2 diabetes mellitus without complications: Secondary | ICD-10-CM | POA: Diagnosis not present

## 2015-01-01 DIAGNOSIS — E1165 Type 2 diabetes mellitus with hyperglycemia: Secondary | ICD-10-CM | POA: Diagnosis not present

## 2015-01-01 DIAGNOSIS — E782 Mixed hyperlipidemia: Secondary | ICD-10-CM | POA: Diagnosis not present

## 2015-01-01 DIAGNOSIS — R7301 Impaired fasting glucose: Secondary | ICD-10-CM | POA: Diagnosis not present

## 2015-01-01 DIAGNOSIS — I10 Essential (primary) hypertension: Secondary | ICD-10-CM | POA: Diagnosis not present

## 2015-01-31 ENCOUNTER — Ambulatory Visit (INDEPENDENT_AMBULATORY_CARE_PROVIDER_SITE_OTHER): Payer: Medicare Other | Admitting: Psychology

## 2015-01-31 DIAGNOSIS — F320 Major depressive disorder, single episode, mild: Secondary | ICD-10-CM | POA: Diagnosis not present

## 2015-02-01 ENCOUNTER — Encounter (HOSPITAL_COMMUNITY): Payer: Self-pay | Admitting: Psychology
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
## 2015-02-26 ENCOUNTER — Ambulatory Visit (INDEPENDENT_AMBULATORY_CARE_PROVIDER_SITE_OTHER): Payer: Medicare Other | Admitting: Psychology

## 2015-02-26 DIAGNOSIS — F331 Major depressive disorder, recurrent, moderate: Secondary | ICD-10-CM | POA: Diagnosis not present

## 2015-02-28 ENCOUNTER — Encounter (HOSPITAL_COMMUNITY): Payer: Self-pay | Admitting: Psychology
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
## 2015-03-18 ENCOUNTER — Ambulatory Visit (INDEPENDENT_AMBULATORY_CARE_PROVIDER_SITE_OTHER): Payer: Medicare Other | Admitting: Psychiatry

## 2015-03-18 ENCOUNTER — Encounter (HOSPITAL_COMMUNITY): Payer: Self-pay | Admitting: Psychiatry

## 2015-03-18 VITALS — BP 146/79 | HR 66 | Ht 72.0 in | Wt 236.8 lb

## 2015-03-18 DIAGNOSIS — F331 Major depressive disorder, recurrent, moderate: Secondary | ICD-10-CM

## 2015-03-18 MED ORDER — DIAZEPAM 5 MG PO TABS: 5.0000 mg | ORAL_TABLET | Freq: Two times a day (BID) | ORAL | Status: DC | PRN

## 2015-03-18 MED ORDER — ESCITALOPRAM OXALATE 10 MG PO TABS: 10.0000 mg | ORAL_TABLET | Freq: Every day | ORAL | Status: DC
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
## 2015-04-02 ENCOUNTER — Ambulatory Visit (INDEPENDENT_AMBULATORY_CARE_PROVIDER_SITE_OTHER): Payer: Medicare Other | Admitting: Psychology

## 2015-04-02 DIAGNOSIS — F331 Major depressive disorder, recurrent, moderate: Secondary | ICD-10-CM

## 2015-04-30 ENCOUNTER — Ambulatory Visit (INDEPENDENT_AMBULATORY_CARE_PROVIDER_SITE_OTHER): Payer: Medicare Other | Admitting: Psychology

## 2015-04-30 DIAGNOSIS — F331 Major depressive disorder, recurrent, moderate: Secondary | ICD-10-CM

## 2015-05-02 DIAGNOSIS — E1165 Type 2 diabetes mellitus with hyperglycemia: Secondary | ICD-10-CM | POA: Diagnosis not present

## 2015-05-02 DIAGNOSIS — I10 Essential (primary) hypertension: Secondary | ICD-10-CM | POA: Diagnosis not present

## 2015-05-02 DIAGNOSIS — N401 Enlarged prostate with lower urinary tract symptoms: Secondary | ICD-10-CM | POA: Diagnosis not present

## 2015-05-06 DIAGNOSIS — E782 Mixed hyperlipidemia: Secondary | ICD-10-CM | POA: Diagnosis not present

## 2015-05-06 DIAGNOSIS — E1165 Type 2 diabetes mellitus with hyperglycemia: Secondary | ICD-10-CM | POA: Diagnosis not present

## 2015-05-06 DIAGNOSIS — I10 Essential (primary) hypertension: Secondary | ICD-10-CM | POA: Diagnosis not present

## 2015-05-28 ENCOUNTER — Ambulatory Visit (INDEPENDENT_AMBULATORY_CARE_PROVIDER_SITE_OTHER): Payer: Medicare Other | Admitting: Psychology

## 2015-05-28 ENCOUNTER — Encounter (HOSPITAL_COMMUNITY): Payer: Self-pay | Admitting: Psychology

## 2015-05-28 DIAGNOSIS — F331 Major depressive disorder, recurrent, moderate: Secondary | ICD-10-CM | POA: Diagnosis not present
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
## 2015-06-04 DIAGNOSIS — G629 Polyneuropathy, unspecified: Secondary | ICD-10-CM | POA: Diagnosis not present

## 2015-06-04 DIAGNOSIS — S90861A Insect bite (nonvenomous), right foot, initial encounter: Secondary | ICD-10-CM | POA: Diagnosis not present

## 2015-06-04 DIAGNOSIS — S90862A Insect bite (nonvenomous), left foot, initial encounter: Secondary | ICD-10-CM | POA: Diagnosis not present
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
## 2015-06-28 DIAGNOSIS — H35432 Paving stone degeneration of retina, left eye: Secondary | ICD-10-CM | POA: Diagnosis not present

## 2015-06-28 DIAGNOSIS — H43812 Vitreous degeneration, left eye: Secondary | ICD-10-CM | POA: Diagnosis not present

## 2015-06-28 DIAGNOSIS — H35372 Puckering of macula, left eye: Secondary | ICD-10-CM | POA: Diagnosis not present

## 2015-06-30 DIAGNOSIS — L03119 Cellulitis of unspecified part of limb: Secondary | ICD-10-CM | POA: Diagnosis not present

## 2015-07-18 ENCOUNTER — Encounter (HOSPITAL_COMMUNITY): Payer: Self-pay | Admitting: Psychiatry

## 2015-07-18 ENCOUNTER — Ambulatory Visit (INDEPENDENT_AMBULATORY_CARE_PROVIDER_SITE_OTHER): Payer: Medicare Other | Admitting: Psychiatry

## 2015-07-18 VITALS — BP 157/66 | HR 58 | Ht 72.0 in | Wt 229.8 lb

## 2015-07-18 DIAGNOSIS — F329 Major depressive disorder, single episode, unspecified: Secondary | ICD-10-CM

## 2015-07-18 DIAGNOSIS — F331 Major depressive disorder, recurrent, moderate: Secondary | ICD-10-CM

## 2015-07-18 MED ORDER — ESCITALOPRAM OXALATE 10 MG PO TABS: 10.0000 mg | ORAL_TABLET | Freq: Every day | ORAL | Status: DC

## 2015-07-18 MED ORDER — DIAZEPAM 5 MG PO TABS: 5.0000 mg | ORAL_TABLET | Freq: Two times a day (BID) | ORAL | Status: DC | PRN
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
## 2015-07-24 ENCOUNTER — Other Ambulatory Visit: Payer: Self-pay | Admitting: Urology

## 2015-07-24 ENCOUNTER — Encounter (HOSPITAL_COMMUNITY): Payer: Self-pay | Admitting: *Deleted

## 2015-07-24 DIAGNOSIS — N401 Enlarged prostate with lower urinary tract symptoms: Secondary | ICD-10-CM | POA: Diagnosis not present

## 2015-07-24 DIAGNOSIS — N312 Flaccid neuropathic bladder, not elsewhere classified: Secondary | ICD-10-CM | POA: Diagnosis not present

## 2015-07-24 DIAGNOSIS — C679 Malignant neoplasm of bladder, unspecified: Secondary | ICD-10-CM | POA: Diagnosis not present

## 2015-07-24 NOTE — Progress Notes (Signed)
Request release of orders to Epic sign and held surgery 07-27-15 pre op 07-26-15 Thanks

## 2015-07-26 ENCOUNTER — Encounter (HOSPITAL_COMMUNITY)
Admission: RE | Admit: 2015-07-26 | Discharge: 2015-07-26 | Disposition: A | Discharge status: Home / Self Care | Payer: Medicare Other | Source: Ambulatory Visit | Attending: Urology | Admitting: Urology

## 2015-07-26 ENCOUNTER — Other Ambulatory Visit: Payer: Self-pay

## 2015-07-26 ENCOUNTER — Encounter (HOSPITAL_COMMUNITY): Payer: Self-pay

## 2015-07-26 DIAGNOSIS — Z8051 Family history of malignant neoplasm of kidney: Secondary | ICD-10-CM | POA: Diagnosis not present

## 2015-07-26 DIAGNOSIS — E119 Type 2 diabetes mellitus without complications: Secondary | ICD-10-CM | POA: Diagnosis not present

## 2015-07-26 DIAGNOSIS — Z79899 Other long term (current) drug therapy: Secondary | ICD-10-CM | POA: Diagnosis not present

## 2015-07-26 DIAGNOSIS — N138 Other obstructive and reflux uropathy: Secondary | ICD-10-CM | POA: Diagnosis not present

## 2015-07-26 DIAGNOSIS — G4730 Sleep apnea, unspecified: Secondary | ICD-10-CM | POA: Diagnosis not present

## 2015-07-26 DIAGNOSIS — Z87891 Personal history of nicotine dependence: Secondary | ICD-10-CM | POA: Diagnosis not present

## 2015-07-26 DIAGNOSIS — R338 Other retention of urine: Secondary | ICD-10-CM | POA: Diagnosis not present

## 2015-07-26 DIAGNOSIS — Z882 Allergy status to sulfonamides status: Secondary | ICD-10-CM | POA: Diagnosis not present

## 2015-07-26 DIAGNOSIS — F419 Anxiety disorder, unspecified: Secondary | ICD-10-CM | POA: Diagnosis not present

## 2015-07-26 DIAGNOSIS — M1990 Unspecified osteoarthritis, unspecified site: Secondary | ICD-10-CM | POA: Diagnosis not present

## 2015-07-26 DIAGNOSIS — I10 Essential (primary) hypertension: Secondary | ICD-10-CM | POA: Diagnosis not present

## 2015-07-26 DIAGNOSIS — C675 Malignant neoplasm of bladder neck: Secondary | ICD-10-CM | POA: Diagnosis not present

## 2015-07-26 DIAGNOSIS — F329 Major depressive disorder, single episode, unspecified: Secondary | ICD-10-CM | POA: Diagnosis not present

## 2015-07-26 DIAGNOSIS — C673 Malignant neoplasm of anterior wall of bladder: Secondary | ICD-10-CM | POA: Diagnosis not present

## 2015-07-26 DIAGNOSIS — Z8042 Family history of malignant neoplasm of prostate: Secondary | ICD-10-CM | POA: Diagnosis not present

## 2015-07-26 LAB — CBC
HCT: 41.7 % (ref 39.0–52.0)
Hemoglobin: 13.6 g/dL (ref 13.0–17.0)
MCH: 29.8 pg (ref 26.0–34.0)
MCHC: 32.6 g/dL (ref 30.0–36.0)
MCV: 91.2 fL (ref 78.0–100.0)
Platelets: 228 10*3/uL (ref 150–400)
RBC: 4.57 MIL/uL (ref 4.22–5.81)
RDW: 13.8 % (ref 11.5–15.5)
WBC: 6.8 10*3/uL (ref 4.0–10.5)

## 2015-07-26 LAB — BASIC METABOLIC PANEL
Anion gap: 8 (ref 5–15)
BUN: 19 mg/dL (ref 6–20)
CO2: 27 mmol/L (ref 22–32)
Calcium: 9.2 mg/dL (ref 8.9–10.3)
Chloride: 106 mmol/L (ref 101–111)
Creatinine, Ser: 1.23 mg/dL (ref 0.61–1.24)
GFR calc Af Amer: >60 mL/min (ref >60)
GFR, EST NON AFRICAN AMERICAN: 59 mL/min — AB (ref >60)
Glucose, Bld: 97 mg/dL (ref 65–99)
Potassium: 3.8 mmol/L (ref 3.5–5.1)
Sodium: 141 mmol/L (ref 135–145)

## 2015-07-26 NOTE — Patient Instructions (Addendum)
Eura Radabaugh Threat  07/26/2015   Your procedure is scheduled on: 07/27/2015    Report to [facility] Main  Entrance and have a seat in the chairs in the lobby at 0615am.  Nurse from Short Stay will meet you there.   Call this number if you have problems the morning of surgery [phone]   Remember: ONLY 1 PERSON MAY GO WITH YOU TO SHORT STAY TO GET  READY MORNING OF YOUR SURGERY.  Do not eat food or drink liquids :After Midnight.                Bring CPAP mask and tubing.   Take these medicines the morning of surgery with A SIP OF WATER:   Lexapro, Valium if needed                                You may not have any metal on your body including hair pins and              piercings  Do not wear jewelry,  lotions, powders or perfumes, deodorant                         Men may shave face and neck.   Do not bring valuables to the hospital. [place].  Contacts, dentures or bridgework may not be worn into surgery.  Leave suitcase in the car. After surgery it may be brought to your room.       Special Instructions: coughing and deep breathing exercises, leg exercises               Please read over the following fact sheets you were given: _____________________________________________________________________             [facility] - Preparing for Surgery Before surgery, you can play an important role.  Because skin is not sterile, your skin needs to be as free of germs as possible.  You can reduce the number of germs on your skin by washing with CHG (chlorahexidine gluconate) soap before surgery.  CHG is an antiseptic cleaner which kills germs and bonds with the skin to continue killing germs even after washing. Please DO NOT use if you have an allergy to CHG or antibacterial soaps.  If your skin becomes reddened/irritated stop using the CHG and inform your nurse when you arrive at Short Stay. Do not shave (including  legs and underarms) for at least 48 hours prior to the first CHG shower.  You may shave your face/neck. Please follow these instructions carefully:  1.  Shower with CHG Soap the night before surgery and the  morning of Surgery.  2.  If you choose to wash your hair, wash your hair first as usual with your  normal  shampoo.  3.  After you shampoo, rinse your hair and body thoroughly to remove the  shampoo.                           4.  Use CHG as you would any other liquid soap.  You can apply chg directly  to the skin and wash  Gently with a scrungie or clean washcloth.  5.  Apply the CHG Soap to your body ONLY FROM THE NECK DOWN.   Do not use on face/ open                           Wound or open sores. Avoid contact with eyes, ears mouth and genitals (private parts).                       Wash face,  Genitals (private parts) with your normal soap.             6.  Wash thoroughly, paying special attention to the area where your surgery  will be performed.  7.  Thoroughly rinse your body with warm water from the neck down.  8.  DO NOT shower/wash with your normal soap after using and rinsing off  the CHG Soap.                9.  Pat yourself dry with a clean towel.            10.  Wear clean pajamas.            11.  Place clean sheets on your bed the night of your first shower and do not  sleep with pets. Day of Surgery : Do not apply any lotions/deodorants the morning of surgery.  Please wear clean clothes to the hospital/surgery center.  FAILURE TO FOLLOW THESE INSTRUCTIONS MAY RESULT IN THE CANCELLATION OF YOUR SURGERY PATIENT SIGNATURE_________________________________  NURSE SIGNATURE__________________________________  ________________________________________________________________________

## 2015-07-26 NOTE — Anesthesia Preprocedure Evaluation (Addendum)
Anesthesia Evaluation  Patient identified by MRN, date of birth, ID band Patient awake    Reviewed: Allergy & Precautions, H&P , NPO status , Patient's Chart, lab work & pertinent test results  History of Anesthesia Complications (+) PONV  Airway Mallampati: III  TM Distance: <3 FB Neck ROM: Limited    Dental  (+) Implants,    Pulmonary shortness of breath, sleep apnea and Continuous Positive Airway Pressure Ventilation , former smoker,  breath sounds clear to auscultation        Cardiovascular hypertension, Rhythm:Regular Rate:Normal  ECHO 2013 EF 65%   Neuro/Psych  Headaches, Anxiety Depression  Neuromuscular disease    GI/Hepatic   Endo/Other  diabetes  Renal/GU Bladder ca hx     Musculoskeletal   Abdominal (+) + obese,  Abdomen: soft.    Peds  Hematology   Anesthesia Other Findings   Reproductive/Obstetrics                            Anesthesia Physical  Anesthesia Plan  ASA: III  Anesthesia Plan: General   Post-op Pain Management:    Induction: Intravenous  Airway Management Planned: Oral ETT  Additional Equipment:   Intra-op Plan:   Post-operative Plan: Extubation in OR  Informed Consent: I have reviewed the patients History and Physical, chart, labs and discussed the procedure including the risks, benefits and alternatives for the proposed anesthesia with the patient or authorized representative who has indicated his/her understanding and acceptance.     Plan Discussed with: CRNA and Surgeon  Anesthesia Plan Comments: (ETT 7.5 Grade I with Glide in past for CX spine surgery 2014)        Anesthesia Quick Evaluation

## 2015-07-26 NOTE — Progress Notes (Signed)
Patient states about 7-8 years ago he had symptoms like a " stroke" where he could not make a sentence.  No workup.  Symptoms resolved.

## 2015-07-27 ENCOUNTER — Observation Stay (HOSPITAL_COMMUNITY)
Admission: RE | Admit: 2015-07-27 | Discharge: 2015-07-28 | Disposition: A | Discharge status: Home / Self Care | Payer: Medicare Other | Source: Ambulatory Visit | Attending: Urology | Admitting: Urology

## 2015-07-27 ENCOUNTER — Ambulatory Visit (HOSPITAL_COMMUNITY): Payer: Medicare Other | Admitting: Anesthesiology

## 2015-07-27 ENCOUNTER — Encounter (HOSPITAL_COMMUNITY): Payer: Self-pay | Admitting: *Deleted

## 2015-07-27 ENCOUNTER — Encounter (HOSPITAL_COMMUNITY)
Admission: RE | Disposition: A | Discharge status: Home / Self Care | Payer: Self-pay | Source: Ambulatory Visit | Attending: Urology

## 2015-07-27 DIAGNOSIS — Z882 Allergy status to sulfonamides status: Secondary | ICD-10-CM | POA: Insufficient documentation

## 2015-07-27 DIAGNOSIS — Z87891 Personal history of nicotine dependence: Secondary | ICD-10-CM | POA: Insufficient documentation

## 2015-07-27 DIAGNOSIS — C673 Malignant neoplasm of anterior wall of bladder: Principal | ICD-10-CM | POA: Insufficient documentation

## 2015-07-27 DIAGNOSIS — N3289 Other specified disorders of bladder: Secondary | ICD-10-CM | POA: Diagnosis not present

## 2015-07-27 DIAGNOSIS — F419 Anxiety disorder, unspecified: Secondary | ICD-10-CM | POA: Diagnosis not present

## 2015-07-27 DIAGNOSIS — N138 Other obstructive and reflux uropathy: Secondary | ICD-10-CM | POA: Insufficient documentation

## 2015-07-27 DIAGNOSIS — C675 Malignant neoplasm of bladder neck: Secondary | ICD-10-CM | POA: Insufficient documentation

## 2015-07-27 DIAGNOSIS — G4730 Sleep apnea, unspecified: Secondary | ICD-10-CM | POA: Diagnosis not present

## 2015-07-27 DIAGNOSIS — F329 Major depressive disorder, single episode, unspecified: Secondary | ICD-10-CM | POA: Insufficient documentation

## 2015-07-27 DIAGNOSIS — E119 Type 2 diabetes mellitus without complications: Secondary | ICD-10-CM | POA: Insufficient documentation

## 2015-07-27 DIAGNOSIS — I10 Essential (primary) hypertension: Secondary | ICD-10-CM | POA: Insufficient documentation

## 2015-07-27 DIAGNOSIS — D494 Neoplasm of unspecified behavior of bladder: Secondary | ICD-10-CM | POA: Diagnosis present

## 2015-07-27 DIAGNOSIS — Z8042 Family history of malignant neoplasm of prostate: Secondary | ICD-10-CM | POA: Insufficient documentation

## 2015-07-27 DIAGNOSIS — R338 Other retention of urine: Secondary | ICD-10-CM | POA: Insufficient documentation

## 2015-07-27 DIAGNOSIS — M1990 Unspecified osteoarthritis, unspecified site: Secondary | ICD-10-CM | POA: Insufficient documentation

## 2015-07-27 DIAGNOSIS — Z8051 Family history of malignant neoplasm of kidney: Secondary | ICD-10-CM | POA: Insufficient documentation

## 2015-07-27 DIAGNOSIS — Z79899 Other long term (current) drug therapy: Secondary | ICD-10-CM | POA: Insufficient documentation

## 2015-07-27 DIAGNOSIS — C679 Malignant neoplasm of bladder, unspecified: Secondary | ICD-10-CM | POA: Diagnosis not present

## 2015-07-27 HISTORY — PX: TRANSURETHRAL RESECTION OF BLADDER TUMOR: SHX2575

## 2015-07-27 SURGERY — TURBT (TRANSURETHRAL RESECTION OF BLADDER TUMOR)
Anesthesia: General | Site: Bladder | Laterality: Right

## 2015-07-27 MED ORDER — HYDROCHLOROTHIAZIDE 12.5 MG PO CAPS
12.5000 mg | ORAL_CAPSULE | Freq: Every day | ORAL | Status: DC
  Administered 2015-07-27 – 2015-07-28 (×2): 12.5 mg via ORAL
  Filled 2015-07-27 (×2): qty 1

## 2015-07-27 MED ORDER — BACITRACIN-NEOMYCIN-POLYMYXIN 400-5-5000 EX OINT: 1.0000 "application " | TOPICAL_OINTMENT | Freq: Three times a day (TID) | CUTANEOUS | Status: DC | PRN

## 2015-07-27 MED ORDER — BELLADONNA ALKALOIDS-OPIUM 16.2-60 MG RE SUPP
RECTAL | Status: DC | PRN
  Administered 2015-07-27: 1 via RECTAL

## 2015-07-27 MED ORDER — ONDANSETRON HCL 4 MG/2ML IJ SOLN: 4.0000 mg | INTRAMUSCULAR | Status: DC | PRN

## 2015-07-27 MED ORDER — PROMETHAZINE HCL 25 MG/ML IJ SOLN: 6.2500 mg | INTRAMUSCULAR | Status: DC | PRN

## 2015-07-27 MED ORDER — EPHEDRINE SULFATE 50 MG/ML IJ SOLN
INTRAMUSCULAR | Status: DC | PRN
  Administered 2015-07-27 (×2): 5 mg via INTRAVENOUS

## 2015-07-27 MED ORDER — SODIUM CHLORIDE 0.45 % IV SOLN
INTRAVENOUS | Status: DC
  Administered 2015-07-27 – 2015-07-28 (×2): via INTRAVENOUS

## 2015-07-27 MED ORDER — ONDANSETRON HCL 4 MG/2ML IJ SOLN
INTRAMUSCULAR | Status: AC
  Filled 2015-07-27: qty 2

## 2015-07-27 MED ORDER — VALSARTAN-HYDROCHLOROTHIAZIDE 320-12.5 MG PO TABS: 1.0000 | ORAL_TABLET | Freq: Every day | ORAL | Status: DC

## 2015-07-27 MED ORDER — SODIUM CHLORIDE 0.9 % IR SOLN
Status: DC | PRN
  Administered 2015-07-27: 3000 mL
  Administered 2015-07-27: 12000 mL

## 2015-07-27 MED ORDER — LIDOCAINE HCL (CARDIAC) 20 MG/ML IV SOLN
INTRAVENOUS | Status: AC
  Filled 2015-07-27: qty 5

## 2015-07-27 MED ORDER — OXYBUTYNIN CHLORIDE ER 10 MG PO TB24
10.0000 mg | ORAL_TABLET | Freq: Every day | ORAL | Status: DC
  Administered 2015-07-27 – 2015-07-28 (×2): 10 mg via ORAL
  Filled 2015-07-27 (×2): qty 1

## 2015-07-27 MED ORDER — DIPHENHYDRAMINE HCL 50 MG/ML IJ SOLN: 12.5000 mg | Freq: Four times a day (QID) | INTRAMUSCULAR | Status: DC | PRN

## 2015-07-27 MED ORDER — ACETAMINOPHEN 10 MG/ML IV SOLN
1000.0000 mg | Freq: Once | INTRAVENOUS | Status: AC
  Administered 2015-07-27: 1000 mg via INTRAVENOUS

## 2015-07-27 MED ORDER — CEFAZOLIN SODIUM-DEXTROSE 2-3 GM-% IV SOLR
INTRAVENOUS | Status: AC
  Filled 2015-07-27: qty 50

## 2015-07-27 MED ORDER — ONDANSETRON HCL 4 MG/2ML IJ SOLN
INTRAMUSCULAR | Status: DC | PRN
  Administered 2015-07-27: 4 mg via INTRAVENOUS

## 2015-07-27 MED ORDER — MEPERIDINE HCL 25 MG/ML IJ SOLN: 6.2500 mg | INTRAMUSCULAR | Status: DC | PRN

## 2015-07-27 MED ORDER — CEFAZOLIN SODIUM-DEXTROSE 2-3 GM-% IV SOLR
2.0000 g | INTRAVENOUS | Status: AC
  Administered 2015-07-27: 2 g via INTRAVENOUS

## 2015-07-27 MED ORDER — ZOLPIDEM TARTRATE 5 MG PO TABS: 5.0000 mg | ORAL_TABLET | Freq: Every evening | ORAL | Status: DC | PRN

## 2015-07-27 MED ORDER — FENTANYL CITRATE (PF) 250 MCG/5ML IJ SOLN
INTRAMUSCULAR | Status: AC
  Filled 2015-07-27: qty 5

## 2015-07-27 MED ORDER — DIPHENHYDRAMINE HCL 12.5 MG/5ML PO ELIX: 12.5000 mg | ORAL_SOLUTION | Freq: Four times a day (QID) | ORAL | Status: DC | PRN

## 2015-07-27 MED ORDER — BELLADONNA ALKALOIDS-OPIUM 16.2-60 MG RE SUPP
RECTAL | Status: AC
  Filled 2015-07-27: qty 1

## 2015-07-27 MED ORDER — FENTANYL CITRATE (PF) 100 MCG/2ML IJ SOLN
INTRAMUSCULAR | Status: DC | PRN
  Administered 2015-07-27: 50 ug via INTRAVENOUS

## 2015-07-27 MED ORDER — SENNOSIDES-DOCUSATE SODIUM 8.6-50 MG PO TABS
2.0000 | ORAL_TABLET | Freq: Every day | ORAL | Status: DC
  Administered 2015-07-27: 2 via ORAL
  Filled 2015-07-27: qty 2

## 2015-07-27 MED ORDER — HYDROMORPHONE HCL 1 MG/ML IJ SOLN: 0.5000 mg | INTRAMUSCULAR | Status: DC | PRN

## 2015-07-27 MED ORDER — OXYCODONE-ACETAMINOPHEN 5-325 MG PO TABS: 1.0000 | ORAL_TABLET | ORAL | Status: DC | PRN

## 2015-07-27 MED ORDER — MIDAZOLAM HCL 5 MG/5ML IJ SOLN
INTRAMUSCULAR | Status: DC | PRN
  Administered 2015-07-27 (×2): 1 mg via INTRAVENOUS

## 2015-07-27 MED ORDER — ACETAMINOPHEN 325 MG PO TABS: 650.0000 mg | ORAL_TABLET | Freq: Four times a day (QID) | ORAL | Status: DC | PRN

## 2015-07-27 MED ORDER — DIAZEPAM 5 MG PO TABS
5.0000 mg | ORAL_TABLET | Freq: Two times a day (BID) | ORAL | Status: DC | PRN
  Administered 2015-07-27: 5 mg via ORAL
  Filled 2015-07-27: qty 1

## 2015-07-27 MED ORDER — IRBESARTAN 150 MG PO TABS
300.0000 mg | ORAL_TABLET | Freq: Every day | ORAL | Status: DC
  Administered 2015-07-27 – 2015-07-28 (×2): 300 mg via ORAL
  Filled 2015-07-27 (×2): qty 2

## 2015-07-27 MED ORDER — OXYBUTYNIN CHLORIDE 5 MG PO TABS: 5.0000 mg | ORAL_TABLET | Freq: Three times a day (TID) | ORAL | Status: DC | PRN

## 2015-07-27 MED ORDER — LIDOCAINE HCL 2 % EX GEL
CUTANEOUS | Status: AC
Start: 2015-07-27 — End: 2015-07-27
  Filled 2015-07-27: qty 10

## 2015-07-27 MED ORDER — MIDAZOLAM HCL 2 MG/2ML IJ SOLN
INTRAMUSCULAR | Status: AC
  Filled 2015-07-27: qty 2

## 2015-07-27 MED ORDER — FENTANYL CITRATE (PF) 100 MCG/2ML IJ SOLN: 25.0000 ug | INTRAMUSCULAR | Status: DC | PRN

## 2015-07-27 MED ORDER — PROPOFOL 10 MG/ML IV BOLUS
INTRAVENOUS | Status: DC | PRN
  Administered 2015-07-27: 200 mg via INTRAVENOUS

## 2015-07-27 MED ORDER — LIDOCAINE HCL (CARDIAC) 20 MG/ML IV SOLN
INTRAVENOUS | Status: DC | PRN
  Administered 2015-07-27: 75 mg via INTRAVENOUS

## 2015-07-27 MED ORDER — KETOROLAC TROMETHAMINE 30 MG/ML IJ SOLN
INTRAMUSCULAR | Status: DC | PRN
  Administered 2015-07-27: 30 mg via INTRAVENOUS

## 2015-07-27 MED ORDER — ESCITALOPRAM OXALATE 10 MG PO TABS
10.0000 mg | ORAL_TABLET | Freq: Every day | ORAL | Status: DC
  Administered 2015-07-28: 10 mg via ORAL
  Filled 2015-07-27: qty 1

## 2015-07-27 MED ORDER — ACETAMINOPHEN 10 MG/ML IV SOLN
INTRAVENOUS | Status: AC
  Filled 2015-07-27: qty 100

## 2015-07-27 MED ORDER — PROPOFOL 10 MG/ML IV BOLUS
INTRAVENOUS | Status: AC
  Filled 2015-07-27: qty 20

## 2015-07-27 MED ORDER — LACTATED RINGERS IV SOLN
INTRAVENOUS | Status: DC
  Administered 2015-07-27: 07:00:00 via INTRAVENOUS

## 2015-07-27 SURGICAL SUPPLY — 16 items
BAG URINE DRAINAGE (UROLOGICAL SUPPLIES) IMPLANT
BAG URO CATCHER STRL LF (DRAPE) ×3 IMPLANT
GLOVE BIOGEL M STRL SZ7.5 (GLOVE) ×3 IMPLANT
GOWN STRL REUS W/TWL LRG LVL3 (GOWN DISPOSABLE) ×3 IMPLANT
GOWN STRL REUS W/TWL XL LVL3 (GOWN DISPOSABLE) ×3 IMPLANT
HOLDER FOLEY CATH W/STRAP (MISCELLANEOUS) IMPLANT
IV NS IRRIG 3000ML ARTHROMATIC (IV SOLUTION) ×6 IMPLANT
KIT ASPIRATION TUBING (SET/KITS/TRAYS/PACK) IMPLANT
LOOP CUT BIPOLAR 24F LRG (ELECTROSURGICAL) ×3 IMPLANT
MANIFOLD NEPTUNE II (INSTRUMENTS) ×3 IMPLANT
NS IRRIG 1000ML POUR BTL (IV SOLUTION) ×3 IMPLANT
PACK CYSTO (CUSTOM PROCEDURE TRAY) ×3 IMPLANT
SCRUB PCMX 4 OZ (MISCELLANEOUS) ×3 IMPLANT
SYRINGE IRR TOOMEY STRL 70CC (SYRINGE) ×3 IMPLANT
TUBING CONNECTING 10 (TUBING) ×4 IMPLANT
TUBING CONNECTING 10' (TUBING) ×2

## 2015-07-27 NOTE — Interval H&P Note (Signed)
History and Physical Interval Note:  07/27/2015 8:36 AM  Zachary [name]  has presented today for surgery, with the diagnosis of BLADDER TUMOR  The various methods of treatment have been discussed with the patient and family. After consideration of risks, benefits and other options for treatment, the patient has consented to  Procedure(s): TRANSURETHRAL RESECTION OF BLADDER TUMOR (TURBT) (Right) as a surgical intervention .  The patient's history has been reviewed, patient examined, no change in status, stable for surgery.  I have reviewed the patient's chart and labs.  Questions were answered to the patient's satisfaction.     [place] Cost I Santosh Petter

## 2015-07-27 NOTE — Transfer of Care (Signed)
Immediate Anesthesia Transfer of Care Note  Patient: Zachary [name]  Procedure(s) Performed: Procedure(s): TRANSURETHRAL RESECTION OF BLADDER TUMOR (TURBT) (Right)  Patient Location: PACU  Anesthesia Type:General  Level of Consciousness: awake, oriented, patient cooperative, lethargic and responds to stimulation  Airway & Oxygen Therapy: Patient Spontanous Breathing and Patient connected to face mask oxygen  Post-op Assessment: Report given to RN, Post -op Vital signs reviewed and stable and Patient moving all extremities  Post vital signs: Reviewed and stable  Last Vitals:  Filed Vitals:   07/27/15 0619  BP: 138/74  Pulse: 67  Temp: 36.4 C  Resp: 16    Complications: No apparent anesthesia complications

## 2015-07-27 NOTE — Progress Notes (Signed)
Pt. Arrived to floor via stretcher from PACU. Alert and oriented x 4. Post Transurethral Resection of Bladder Tumor. Foley intact with bloody urine noted. Pt. Without respiratory distress. Continue with admission.

## 2015-07-27 NOTE — Op Note (Signed)
Pre-operative diagnosis :    Recurrent bladder cancer, with solitary mass right anterior lateral wall  Postoperative diagnosis:  Recurrent bladder cancer, with solitary sessile bladder mass right anterior bladder wall, with multiple bladder tumors identified at the right bladder neck, at the 10:00 to the 12:00 position.  Operation:  Cystourethroscopy, TUR biopsy and resection of multiple bladder tumors. Surgeon:  Chauncey Cruel. [name], MD  First assistant:  None  Anesthesia:  General LMA  Preparation:  After appropriate preanesthesia, the patient was brought the operating room, placed on the operating table in the dorsal supine position where general LMA anesthesia was introduced. He was then replaced in the dorsal lithotomy position where the pubis was prepped with Betadine solution and draped in usual fashion. The arm was double checked. The history was reviewed.  Review history:    69 yo mwm returns today for a 1 week post op. He is s/p cysto/TURBT on 07/27/15. Hx of bladder cancer. 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 He is voiding better, although he still has frequency, urgency, and nocturia X 1. He has poor stream, but believes he is emptying. 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 2008. Note 3rd recurrence within 2 years, with original path showing high-grade, but non-invasive properties in July 2007. Pt has has BCG and mitomycin therapies. He does not use tobacco, and has no family hx of Ca bladder.   Statement of  Likelihood of Success: Excellent. TIME-OUT observed.:  Procedure:  Cystourethroscopy was encompassed, showed the solid sessile mass in the right anterior bladder wall. This had been bleeding, and had calcified to some extent. Because of great difficulty in locating the right anterior bladder wall tumor, it necessitated pressure on the right bladder wall to bring the tumor into location  for resection. The tissue desiccated upon resection. Multiple other tumors are identified at the bladder neck, at the 10:00 to the 12:00 position and these were biopsied. All tissue was sent to laboratory for examination. No bleeding was noted. The patient had an 68 French Foley catheter with 10 mL in the balloon placed and be admitted for observation overnight. He was awakened taken recovery room in good condition. He received penis buzzard, IV Toradol and IV Tylenol.

## 2015-07-27 NOTE — H&P (Signed)
Reason For Visit   Active Problems Problems  1. 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 He is s/p cysto/TURBT on 07/27/15. Hx of bladder cancer. 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 He is voiding better, although he still has frequency, urgency, and nocturia X 1. He has poor stream, but believes he is emptying. 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 2008. Note 3rd recurrence within 2 years, with original path showing high-grade, but non-invasive properties in July 2007. Pt has has BCG and mitomycin therapies. He does not use tobacco, and has no family hx of Ca bladder.   Past Medical History Problems  1. History of Anxiety (Symptom) 2. History of Arthritis 3. History of depression (Z86.59) 4. History of hypertension (Z86.79) 5. History of sleep apnea (Z87.09) 6. History of Murmur (R01.1) 7. Personal history of bladder cancer (Z85.51) 8. History of Urinary retention (R33.9)  Surgical History Problems  1. History of Back Surgery 2. History of Cystoscopy Bladder Tumor 3. History of Cystoscopy With Fulguration Medium Lesion (2-5cm) 4. History of Neck Surgery  Current Meds 1. Bethanechol Chloride 25 MG Oral Tablet; TAKE 2 TABLET BID;  Therapy: [id number] to (Evaluate:15Jul2016)  Requested for: 21Jul2015; Last  Rx:21Jul2015 Ordered 2. Diovan HCT 320-12.5 MG Oral Tablet;  Therapy: 10Feb2010 to Recorded 3. Lexapro 10 MG Oral Tablet;  Therapy: [id number] to Recorded  Allergies Medication  1. Sulfa Drugs  Family History Problems  1. Family history of kidney stones (Z84.1) 2. Family history of prostate cancer (Z80.42) : Father 3. Family history of Kidney Cancer 4. Family history of Prostate Cancer 5. Family history  of Urologic Disorder   Kidney stones  Social History Problems  1. Denied: Alcohol Use 2. Former smoker [phone]) 3. Marital History - Currently Married 4. Occupation:   Pensions consultant 5. Tobacco use (Z72.0)   Has been smoking for 1 year so far.  Review of Systems Genitourinary, constitutional, skin, eye, otolaryngeal, hematologic/lymphatic, cardiovascular, pulmonary, endocrine, musculoskeletal, gastrointestinal, neurological and psychiatric system(s) were reviewed and pertinent findings if present are noted and are otherwise negative.  Genitourinary: urinary frequency, feelings of urinary urgency and nocturia.    Physical Exam Constitutional: Well nourished and well developed . No acute distress.  ENT:. The ears and nose are normal in appearance.  Neck: The appearance of the neck is normal and no neck mass is present.  Pulmonary: No respiratory distress.  Cardiovascular:. No peripheral edema.  Abdomen: The abdomen is mildly obese. The abdomen is soft and nontender. No masses are palpated. No CVA tenderness. No hernias are palpable. No hepatosplenomegaly noted.  Genitourinary: Examination of the penis demonstrates no discharge, no masses, no lesions and a normal meatus. The penis is uncircumcised. The scrotum is normal in appearance and without lesions. Examination of the right scrotum demonstrates no mass. Examination of the left scrotum demostrates no mass. The right epididymis is palpably normal and non-tender. The left epididymis is palpably normal and non-tender. The right testis is palpably normal, non-tender and without masses. The left testis is non-tender and without masses.  Lymphatics: The femoral and inguinal nodes are not enlarged or tender.  Skin: Normal skin turgor, no visible rash and no visible skin lesions.  Neuro/Psych:. Mood and affect are appropriate.  Results/Data Selected Results  UA With REFLEX 27Jul2016 10:44AM [name]  SPECIMEN TYPE: [place]    Test Name Result Flag Reference  COLOR YELLOW  YELLOW  ** PLEASE NOTE CHANGE IN UNIT OF MEASURE AND REFERENCE RANGE(S). **  APPEARANCE CLEAR  CLEAR  SPECIFIC GRAVITY 1.025  1.001-1.035  pH 6.0  5.0-8.0  GLUCOSE NEGATIVE  NEGATIVE  BILIRUBIN NEGATIVE  NEGATIVE  KETONE NEGATIVE  NEGATIVE  BLOOD 2+ A NEGATIVE  PROTEIN 1+ A NEGATIVE  NITRITE NEGATIVE  NEGATIVE  LEUKOCYTE ESTERASE NEGATIVE  NEGATIVE  SQUAMOUS EPITHELIAL/HPF 0-5 HPF  RARE  WBC 0-5 WBC/HPF  <=5  RBC 3-10 RBC/HPF A <=2  BACTERIA NONE SEEN HPF  RARE  CRYSTALS NONE SEEN HPF  NONE SEEN  CASTS NONE SEEN LPF  NONE SEEN  Yeast NONE SEEN HPF  NONE SEEN   Procedure  Procedure: Cystoscopy  Chaperone Present: laura.  Indication: History of Urothelial Carcinoma.  Informed Consent: Risks, benefits, and potential adverse events were discussed and informed consent was obtained from the patient . Specific risks including, but not limited to bleeding, infection, pain, allergic reaction etc. were explained.  Prep: The patient was prepped with betadine.  Anesthesia:. Local anesthesia was administered intraurethrally with 2% lidocaine jelly.  Antibiotic prophylaxis: Ciprofloxacin.  Procedure Note:  Urethral meatus:. No abnormalities.  Anterior urethra: No abnormalities.  Prostatic urethra:. The lateral prostatic lobes were enlarged. No intravesical median lobe was visualized.  Bladder: Examination of the bladder demonstrated no trabeculation and no diverticulum. A solitary tumor was visualized in the bladder. A sessile tumor was seen in the bladder measuring approximately 2 cm in size.    Assessment Assessed  1. 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   Plan TUR-BT.   Signatures Electronically signed by : [name], M.D.; Jul 27 2015  8:33AM EST

## 2015-07-27 NOTE — Anesthesia Procedure Notes (Signed)
Procedure Name: LMA Insertion Date/Time: 07/27/2015 8:30 AM Performed by: Ofilia Neas Pre-anesthesia Checklist: Patient identified, Timeout performed, Emergency Drugs available, Suction available and Patient being monitored Patient Re-evaluated:Patient Re-evaluated prior to inductionOxygen Delivery Method: Circle system utilized Preoxygenation: Pre-oxygenation with 100% oxygen Intubation Type: IV induction Ventilation: Mask ventilation without difficulty LMA: LMA inserted LMA Size: 4.0 Number of attempts: 1 Tube secured with: Tape Dental Injury: Teeth and Oropharynx as per pre-operative assessment

## 2015-07-27 NOTE — Anesthesia Postprocedure Evaluation (Signed)
  Anesthesia Post-op Note  Patient: Zachary [name]  Procedure(s) Performed: Procedure(s): TRANSURETHRAL RESECTION OF BLADDER TUMOR (TURBT) (Right)  Patient Location: PACU  Anesthesia Type:General  Level of Consciousness: awake and alert   Airway and Oxygen Therapy: Patient Spontanous Breathing  Post-op Pain: mild  Post-op Assessment: Post-op Vital signs reviewed, Patient's Cardiovascular Status Stable, Respiratory Function Stable, Patent Airway and No signs of Nausea or vomiting              Post-op Vital Signs: Reviewed and stable  Last Vitals:  Filed Vitals:   07/27/15 0945  BP:   Pulse:   Temp: 37.1 C  Resp:     Complications: No apparent anesthesia complications

## 2015-07-27 NOTE — Progress Notes (Signed)
RT contact Service Response- informed receiver that PT has home CPAP equipment and needs [employer] inspection.

## 2015-07-28 DIAGNOSIS — F419 Anxiety disorder, unspecified: Secondary | ICD-10-CM | POA: Diagnosis not present

## 2015-07-28 DIAGNOSIS — C673 Malignant neoplasm of anterior wall of bladder: Secondary | ICD-10-CM | POA: Diagnosis not present

## 2015-07-28 DIAGNOSIS — G4730 Sleep apnea, unspecified: Secondary | ICD-10-CM | POA: Diagnosis not present

## 2015-07-28 DIAGNOSIS — C675 Malignant neoplasm of bladder neck: Secondary | ICD-10-CM | POA: Diagnosis not present

## 2015-07-28 DIAGNOSIS — I10 Essential (primary) hypertension: Secondary | ICD-10-CM | POA: Diagnosis not present

## 2015-07-28 DIAGNOSIS — F329 Major depressive disorder, single episode, unspecified: Secondary | ICD-10-CM | POA: Diagnosis not present

## 2015-07-28 MED ORDER — PHENAZOPYRIDINE HCL 200 MG PO TABS: 200.0000 mg | ORAL_TABLET | Freq: Three times a day (TID) | ORAL | Status: DC | PRN

## 2015-07-28 MED ORDER — METHENAMINE MAND-SOD PHOSPHATE 500-500 MG PO TABS: 1.0000 | ORAL_TABLET | Freq: Four times a day (QID) | ORAL | Status: DC

## 2015-07-28 MED ORDER — TRAMADOL-ACETAMINOPHEN 37.5-325 MG PO TABS: 1.0000 | ORAL_TABLET | Freq: Four times a day (QID) | ORAL | Status: DC | PRN

## 2015-07-28 NOTE — Discharge Summary (Signed)
Physician Discharge Summary  Patient ID: TEAGAN HEIDRICK MRN: [id number] DOB/AGE: Sep 04, 1946 69 y.o.  Admit date: 07/27/2015 Discharge date: 07/28/2015  Admission Diagnoses: BLADDER TUMOR  Discharge Diagnoses:  Active Problems:   Bladder tumor   Discharged Condition: stable  Hospital Course: TUR-BT  Significant Diagnostic Studies: No results found.  Discharge Exam: Blood pressure 131/55, pulse 62, temperature 97.9 F (36.6 C), temperature source Oral, resp. rate 20, height 6\' 1"  (1.854 m), weight 102.059 kg (225 lb), SpO2 98 %.   Disposition: 01-Home or Self Care  Discharge Instructions    Discharge patient    Complete by:  As directed      Discontinue IV    Complete by:  As directed      Foley catheter - discontinue    Complete by:  As directed             Medication List    TAKE these medications        acetaminophen 500 MG tablet  Commonly known as:  TYLENOL  Take by mouth every 6 (six) hours as needed. For pain     diazepam 5 MG tablet  Commonly known as:  VALIUM  Take 1 tablet (5 mg total) by mouth 2 (two) times daily as needed for anxiety.     escitalopram 10 MG tablet  Commonly known as:  LEXAPRO  Take 1 tablet (10 mg total) by mouth daily.     methenamine-sodium biphosphonate 500-500 MG per tablet  Commonly known as:  UROQUID  Take 1 tablet by mouth 4 (four) times daily.     multivitamin with minerals Tabs tablet  Take 1 tablet by mouth daily.     NON FORMULARY  cpap     phenazopyridine 200 MG tablet  Commonly known as:  PYRIDIUM  Take 1 tablet (200 mg total) by mouth 3 (three) times daily as needed for pain.     traMADol-acetaminophen 37.5-325 MG per tablet  Commonly known as:  ULTRACET  Take 1 tablet by mouth every 6 (six) hours as needed.     valsartan-hydrochlorothiazide 320-12.5 MG per tablet  Commonly known as:  DIOVAN-HCT  Take 1 tablet by mouth daily.           Follow-up Information    Follow up with Ailene Rud,  MD.   Specialty:  Urology   Why:  per [facility] information:   [place] [address] [phone]      Multiple BT found R anterior wall, and behind the bladder neck.  Will need repeat induction BCG. Will have 2nd opinion Dr. Tresa Moore for consideration for future cystectomy.  SignedAilene Rud 07/28/2015, 10:53 AM

## 2015-07-28 NOTE — Progress Notes (Signed)
Pt discharge home with spouse with follow up in urology clinic on Wednesday. Discharge instructions and medications reviewed with patient and spouse. Pt verbalized understanding of instructions. Pt encouraged to hydrate to promote urination. Pt given number for department to call with any questions or difficulty urinating. Pt and spouse agreed. Pt taken via wheel chair to lobby by NT. All property with patient.

## 2015-07-28 NOTE — Progress Notes (Signed)
Foley cath discontinued as ordered.

## 2015-07-28 NOTE — Progress Notes (Signed)
Urology Progress Note  1 Day Post-Op   Subjective: Recurrent clinical  low grade TCC ( awaiting path report). Discussed findings of larger volume of TCC found behind Right bladder neck with pt. He will need another induction course of BCG. I will have him see Drl. Manny for 2nd opinion for future Rx of TCC.     No acute urologic events overnight. Ambulation:   positive Flatus:    positive Bowel movement  positive  Pain: complete resolution  Objective:  Blood pressure 131/55, pulse 62, temperature 97.9 F (36.6 C), temperature source Oral, resp. rate 20, height 6\' 1"  (1.854 m), weight 102.059 kg (225 lb), SpO2 98 %.  Physical Exam:  General:  No acute distress, awake Resp: clear to auscultation bilaterally Genitourinary:  Clear urine Foley: urine clear    I/O last 3 completed shifts: In: 2470.8 [P.O.:1080; I.V.:1390.8] Out: 1370 [Urine:1370]  Recent Labs     07/26/15  1045  HGB  13.6  WBC  6.8  PLT  228    Recent Labs     07/26/15  1045  NA  141  K  3.8  CL  106  CO2  27  BUN  19  CREATININE  1.23  CALCIUM  9.2  GFRNONAA  59*  GFRAA  >60     No results for input(s): INR, APTT in the last 72 hours.  Invalid input(s): PT   Invalid input(s): ABG  Assessment/Plan:  Catheter removed.  D/c. Pt will be scheduled for BCG induction. Has appointment for Wed.

## 2015-07-28 NOTE — Discharge Instructions (Signed)
Transurethral Resection, Bladder Tumor A cancerous growth (tumor) can develop on the inside wall of the bladder. The bladder is the organ that holds urine. One way to remove the tumor is a procedure called a transurethral resection. The tumor is removed (resected) through the tube that carries urine from the bladder out of the body (urethra). No cuts (incisions) are made in the skin. Instead, the procedure is done through a thin telescope, called a resectoscope. Attached to it is a light and usually a tiny camera. The resectoscope is put into the urethra. In men, the urethra opens at the end of the penis. In women, it opens just above the vagina.  A transurethral resection is usually used to remove tumors that have not gotten too big or too deep. These are called Stage 0, Stage 1 or Stage 2 bladder cancers. LET YOUR CAREGIVER KNOW ABOUT:  On the day of the procedure, your caregivers will need to know the last time you had anything to eat or drink. This includes water, gum, and candy. In advance, make sure they know about:   Any allergies.  All medications you are taking, including:  Herbs, eyedrops, over-the-counter medications and creams.  Blood thinners (anticoagulants), aspirin or other drugs that could affect blood clotting.  Use of steroids (by mouth or as creams).  Previous problems with anesthetics, including local anesthetics.  Possibility of pregnancy, if this applies.  Any history of blood clots.  Any history of bleeding or other blood problems.  Previous surgery.  Smoking history.  Any recent symptoms of colds or infections.  Other health problems. RISKS AND COMPLICATIONS This is usually a safe procedure. Every procedure has risks, though. For a transurethral resection, they include:  Infection. Antibiotic medication would need to be taken.  Bleeding.  Light bleeding may last for several days after the procedure.  If bleeding continues or is heavy, the bladder may  need rinsing. Or, a new catheter might be put in for awhile.  Sometimes bed rest is needed.  Urination problems.  Pain and burning can occur when urinating. This usually goes away in a few days.  Scarring from the procedure can block the flow of urine.  Bladder damage.  It can be punctured or torn during removal of the tumor. If this happens, a catheter might be needed for longer. Antibiotics would be taken while the bladder heals.  Urine can leak through the hole or tear into the abdomen. If this happens, surgery may be needed to repair the bladder. BEFORE THE PROCEDURE   A medical evaluation will be done. This may include:  A physical examination.  Urine test. This is to make sure you do not have a urinary tract infection.  Blood tests.  A test that checks the heart's rhythm (electrocardiogram).  Talking with an anesthesiologist. This is the person who will be in charge of the medication (anesthesia) to keep you from feeling pain during the transurethral resection. You might be asleep during the procedure (general anesthesia) or numb from the waist down, but awake during the procedure (spinal anesthesia). Ask your surgeon what to expect.  The person who is having a transurethral resection needs to give what is called informed consent. This requires signing a legal paper that gives permission for the procedure. To give informed consent:  You must understand how the procedure is done and why.  You must be told all the risks and benefits of the procedure.  You must sign the consent. Sometimes a legal guardian  can do this.  Signing should be witnessed by a healthcare professional.  The day before the surgery, eat only a light dinner. Then, do not eat or drink anything for at least 8 hours before the surgery. Ask your caregiver if it is OK to take any needed medicines with a sip of water.  Arrive at least an hour before the surgery or whenever your surgeon recommends. This will  give you time to check in and fill out any needed paperwork. PROCEDURE  The preparation:  You will change into a hospital gown.  A needle will be inserted in your arm. This is an intravenous access tube (IV). Medication will be able to flow directly into your body through this needle.  Small monitors will be put on your body. They are used to check your heart, blood pressure, and oxygen level.  You might be given medication that will help you relax (sedative).  You will be given a general anesthetic or spinal anesthesia.  The procedure:  Once you are asleep or numb from the waist down, your legs will be placed in stirrups.  The resectoscope will be passed through the urethra into the bladder.  Fluid will be passed through the resectoscope. This will fill the bladder with water.  The surgeon will examine the bladder through the scope. If the scope has a camera, it can take pictures from inside the bladder. They can be projected onto a TV screen.  The surgeon will use various tools to remove the tumor in small pieces. Sometimes a laser (a beam of light energy) is used. Other tools may use electric current.  A tube (catheter) will often be placed so that urine can drain into a bag outside the body. This process helps stop bleeding. This tube keeps blood clots from blocking the urethra.  The procedure usually takes 30 to 45 minutes. AFTER THE PROCEDURE   You will stay in a recovery area until the anesthesia has worn off. Your blood pressure and pulse will be checked every so often. Then you will be taken to a hospital room.  You may continue to get fluids through the IV for awhile.  Some pain is normal. The catheter might be uncomfortable. Pain is usually not severe. If it is, ask for pain medicine.  Your urine may look bloody after a transurethral resection. This is normal.  If bleeding is heavy, a hospital caregiver may rinse out the bladder (irrigation) through the  catheter.  Once the urine is clear, the catheter will be taken out.  You will need to stay in the hospital until you can urinate on your own.  Most people stay in the hospital for up to 4 days. PROGNOSIS   Transurethral resection is considered the best way to treat bladder tumors that are not too far along. For most people, the treatment is successful. Sometimes, though, more treatment is needed.  Bladder cancers can come back even after a successful procedure. Because of this, be sure to have a checkup with your caregiver every 3 to 6 months. If everything is OK for 3 years, you can reduce the checkups to once a year. Document Released: 10/10/2009 Document Revised: 03/07/2012 Document Reviewed: 01/23/2014 [facility] Patient Information 2015 [place], [place]. This information is not intended to replace advice given to you by your health care provider. Make sure you discuss any questions you have with your health care provider.

## 2015-07-29 ENCOUNTER — Encounter (HOSPITAL_COMMUNITY): Payer: Self-pay | Admitting: Urology

## 2015-07-31 DIAGNOSIS — I723 Aneurysm of iliac artery: Secondary | ICD-10-CM | POA: Diagnosis not present

## 2015-07-31 DIAGNOSIS — C679 Malignant neoplasm of bladder, unspecified: Secondary | ICD-10-CM | POA: Diagnosis not present

## 2015-07-31 DIAGNOSIS — N202 Calculus of kidney with calculus of ureter: Secondary | ICD-10-CM | POA: Diagnosis not present

## 2015-08-02 DIAGNOSIS — N312 Flaccid neuropathic bladder, not elsewhere classified: Secondary | ICD-10-CM | POA: Diagnosis not present

## 2015-08-02 DIAGNOSIS — C672 Malignant neoplasm of lateral wall of bladder: Secondary | ICD-10-CM | POA: Diagnosis not present

## 2015-09-03 DIAGNOSIS — Z23 Encounter for immunization: Secondary | ICD-10-CM | POA: Diagnosis not present

## 2015-09-10 DIAGNOSIS — C672 Malignant neoplasm of lateral wall of bladder: Secondary | ICD-10-CM | POA: Diagnosis not present

## 2015-09-10 DIAGNOSIS — Z5111 Encounter for antineoplastic chemotherapy: Secondary | ICD-10-CM | POA: Diagnosis not present

## 2015-09-17 DIAGNOSIS — C672 Malignant neoplasm of lateral wall of bladder: Secondary | ICD-10-CM | POA: Diagnosis not present

## 2015-09-17 DIAGNOSIS — N390 Urinary tract infection, site not specified: Secondary | ICD-10-CM | POA: Diagnosis not present

## 2015-09-19 DIAGNOSIS — R262 Difficulty in walking, not elsewhere classified: Secondary | ICD-10-CM | POA: Diagnosis not present

## 2015-09-19 DIAGNOSIS — M170 Bilateral primary osteoarthritis of knee: Secondary | ICD-10-CM | POA: Diagnosis not present

## 2015-09-19 DIAGNOSIS — M25561 Pain in right knee: Secondary | ICD-10-CM | POA: Diagnosis not present

## 2015-09-19 DIAGNOSIS — M25562 Pain in left knee: Secondary | ICD-10-CM | POA: Diagnosis not present

## 2015-09-23 DIAGNOSIS — M25562 Pain in left knee: Secondary | ICD-10-CM | POA: Diagnosis not present

## 2015-09-23 DIAGNOSIS — R262 Difficulty in walking, not elsewhere classified: Secondary | ICD-10-CM | POA: Diagnosis not present

## 2015-09-23 DIAGNOSIS — M1712 Unilateral primary osteoarthritis, left knee: Secondary | ICD-10-CM | POA: Diagnosis not present

## 2015-09-25 ENCOUNTER — Other Ambulatory Visit (HOSPITAL_COMMUNITY): Payer: Self-pay | Admitting: Psychiatry

## 2015-09-26 DIAGNOSIS — M1711 Unilateral primary osteoarthritis, right knee: Secondary | ICD-10-CM | POA: Diagnosis not present

## 2015-09-26 DIAGNOSIS — M25561 Pain in right knee: Secondary | ICD-10-CM | POA: Diagnosis not present

## 2015-09-30 DIAGNOSIS — M25562 Pain in left knee: Secondary | ICD-10-CM | POA: Diagnosis not present

## 2015-09-30 DIAGNOSIS — M1712 Unilateral primary osteoarthritis, left knee: Secondary | ICD-10-CM | POA: Diagnosis not present

## 2015-10-01 DIAGNOSIS — C672 Malignant neoplasm of lateral wall of bladder: Secondary | ICD-10-CM | POA: Diagnosis not present

## 2015-10-03 DIAGNOSIS — H35372 Puckering of macula, left eye: Secondary | ICD-10-CM | POA: Diagnosis not present

## 2015-10-03 DIAGNOSIS — H43812 Vitreous degeneration, left eye: Secondary | ICD-10-CM | POA: Diagnosis not present

## 2015-10-03 DIAGNOSIS — H35432 Paving stone degeneration of retina, left eye: Secondary | ICD-10-CM | POA: Diagnosis not present

## 2015-10-04 DIAGNOSIS — M25561 Pain in right knee: Secondary | ICD-10-CM | POA: Diagnosis not present

## 2015-10-04 DIAGNOSIS — M1711 Unilateral primary osteoarthritis, right knee: Secondary | ICD-10-CM | POA: Diagnosis not present

## 2015-10-07 DIAGNOSIS — M1712 Unilateral primary osteoarthritis, left knee: Secondary | ICD-10-CM | POA: Diagnosis not present

## 2015-10-07 DIAGNOSIS — M25562 Pain in left knee: Secondary | ICD-10-CM | POA: Diagnosis not present

## 2015-10-08 DIAGNOSIS — C672 Malignant neoplasm of lateral wall of bladder: Secondary | ICD-10-CM | POA: Diagnosis not present

## 2015-10-08 DIAGNOSIS — N390 Urinary tract infection, site not specified: Secondary | ICD-10-CM | POA: Diagnosis not present

## 2015-10-11 DIAGNOSIS — M1711 Unilateral primary osteoarthritis, right knee: Secondary | ICD-10-CM | POA: Diagnosis not present

## 2015-10-11 DIAGNOSIS — M25561 Pain in right knee: Secondary | ICD-10-CM | POA: Diagnosis not present

## 2015-10-14 DIAGNOSIS — R269 Unspecified abnormalities of gait and mobility: Secondary | ICD-10-CM | POA: Diagnosis not present

## 2015-10-14 DIAGNOSIS — M1712 Unilateral primary osteoarthritis, left knee: Secondary | ICD-10-CM | POA: Diagnosis not present

## 2015-10-14 DIAGNOSIS — M25561 Pain in right knee: Secondary | ICD-10-CM | POA: Diagnosis not present

## 2015-10-14 DIAGNOSIS — M25562 Pain in left knee: Secondary | ICD-10-CM | POA: Diagnosis not present

## 2015-10-14 DIAGNOSIS — M170 Bilateral primary osteoarthritis of knee: Secondary | ICD-10-CM | POA: Diagnosis not present

## 2015-10-15 DIAGNOSIS — C672 Malignant neoplasm of lateral wall of bladder: Secondary | ICD-10-CM | POA: Diagnosis not present

## 2015-10-16 DIAGNOSIS — M25562 Pain in left knee: Secondary | ICD-10-CM | POA: Diagnosis not present

## 2015-10-16 DIAGNOSIS — M25561 Pain in right knee: Secondary | ICD-10-CM | POA: Diagnosis not present

## 2015-10-16 DIAGNOSIS — R269 Unspecified abnormalities of gait and mobility: Secondary | ICD-10-CM | POA: Diagnosis not present

## 2015-10-16 DIAGNOSIS — M170 Bilateral primary osteoarthritis of knee: Secondary | ICD-10-CM | POA: Diagnosis not present

## 2015-10-16 DIAGNOSIS — M1711 Unilateral primary osteoarthritis, right knee: Secondary | ICD-10-CM | POA: Diagnosis not present

## 2015-10-22 DIAGNOSIS — C672 Malignant neoplasm of lateral wall of bladder: Secondary | ICD-10-CM | POA: Diagnosis not present

## 2015-10-27 DIAGNOSIS — L03119 Cellulitis of unspecified part of limb: Secondary | ICD-10-CM | POA: Diagnosis not present

## 2015-10-28 DIAGNOSIS — M25561 Pain in right knee: Secondary | ICD-10-CM | POA: Diagnosis not present

## 2015-10-28 DIAGNOSIS — R2689 Other abnormalities of gait and mobility: Secondary | ICD-10-CM | POA: Diagnosis not present

## 2015-10-28 DIAGNOSIS — M170 Bilateral primary osteoarthritis of knee: Secondary | ICD-10-CM | POA: Diagnosis not present

## 2015-10-28 DIAGNOSIS — M25562 Pain in left knee: Secondary | ICD-10-CM | POA: Diagnosis not present

## 2015-10-30 DIAGNOSIS — C672 Malignant neoplasm of lateral wall of bladder: Secondary | ICD-10-CM | POA: Diagnosis not present

## 2015-11-05 DIAGNOSIS — T300 Burn of unspecified body region, unspecified degree: Secondary | ICD-10-CM | POA: Diagnosis not present

## 2015-11-05 DIAGNOSIS — C673 Malignant neoplasm of anterior wall of bladder: Secondary | ICD-10-CM | POA: Diagnosis not present

## 2015-11-05 DIAGNOSIS — C675 Malignant neoplasm of bladder neck: Secondary | ICD-10-CM | POA: Diagnosis not present

## 2015-11-18 ENCOUNTER — Ambulatory Visit (INDEPENDENT_AMBULATORY_CARE_PROVIDER_SITE_OTHER): Payer: Medicare Other | Admitting: Psychiatry

## 2015-11-18 ENCOUNTER — Encounter (HOSPITAL_COMMUNITY): Payer: Self-pay | Admitting: Psychiatry

## 2015-11-18 VITALS — BP 134/62 | HR 56 | Ht 72.5 in | Wt 231.0 lb

## 2015-11-18 DIAGNOSIS — F331 Major depressive disorder, recurrent, moderate: Secondary | ICD-10-CM | POA: Diagnosis not present

## 2015-11-18 MED ORDER — ESCITALOPRAM OXALATE 10 MG PO TABS: 10.0000 mg | ORAL_TABLET | Freq: Every day | ORAL | Status: DC

## 2015-11-18 MED ORDER — DIAZEPAM 5 MG PO TABS: 5.0000 mg | ORAL_TABLET | Freq: Two times a day (BID) | ORAL | Status: DC | PRN
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
## 2015-11-27 DIAGNOSIS — J00 Acute nasopharyngitis [common cold]: Secondary | ICD-10-CM | POA: Diagnosis not present

## 2015-11-27 DIAGNOSIS — R05 Cough: Secondary | ICD-10-CM | POA: Diagnosis not present

## 2015-12-06 DIAGNOSIS — N312 Flaccid neuropathic bladder, not elsewhere classified: Secondary | ICD-10-CM | POA: Diagnosis not present

## 2015-12-06 DIAGNOSIS — R3129 Other microscopic hematuria: Secondary | ICD-10-CM | POA: Diagnosis not present

## 2015-12-06 DIAGNOSIS — N1330 Unspecified hydronephrosis: Secondary | ICD-10-CM | POA: Diagnosis not present

## 2015-12-06 DIAGNOSIS — R338 Other retention of urine: Secondary | ICD-10-CM | POA: Diagnosis not present

## 2015-12-09 DIAGNOSIS — R338 Other retention of urine: Secondary | ICD-10-CM | POA: Diagnosis not present

## 2015-12-26 ENCOUNTER — Encounter (HOSPITAL_COMMUNITY): Payer: Self-pay | Admitting: Emergency Medicine

## 2015-12-26 ENCOUNTER — Emergency Department (HOSPITAL_COMMUNITY)
Admission: EM | Admit: 2015-12-26 | Discharge: 2015-12-26 | Disposition: A | Discharge status: Home / Self Care | Payer: Medicare Other | Attending: Emergency Medicine | Admitting: Emergency Medicine

## 2015-12-26 ENCOUNTER — Emergency Department (HOSPITAL_COMMUNITY): Payer: Medicare Other

## 2015-12-26 DIAGNOSIS — Z8551 Personal history of malignant neoplasm of bladder: Secondary | ICD-10-CM | POA: Insufficient documentation

## 2015-12-26 DIAGNOSIS — F419 Anxiety disorder, unspecified: Secondary | ICD-10-CM | POA: Diagnosis not present

## 2015-12-26 DIAGNOSIS — Z8639 Personal history of other endocrine, nutritional and metabolic disease: Secondary | ICD-10-CM | POA: Insufficient documentation

## 2015-12-26 DIAGNOSIS — M1990 Unspecified osteoarthritis, unspecified site: Secondary | ICD-10-CM | POA: Diagnosis not present

## 2015-12-26 DIAGNOSIS — Z87891 Personal history of nicotine dependence: Secondary | ICD-10-CM | POA: Diagnosis not present

## 2015-12-26 DIAGNOSIS — G4733 Obstructive sleep apnea (adult) (pediatric): Secondary | ICD-10-CM | POA: Diagnosis not present

## 2015-12-26 DIAGNOSIS — R319 Hematuria, unspecified: Secondary | ICD-10-CM

## 2015-12-26 DIAGNOSIS — N390 Urinary tract infection, site not specified: Secondary | ICD-10-CM | POA: Insufficient documentation

## 2015-12-26 DIAGNOSIS — Z792 Long term (current) use of antibiotics: Secondary | ICD-10-CM | POA: Insufficient documentation

## 2015-12-26 DIAGNOSIS — R011 Cardiac murmur, unspecified: Secondary | ICD-10-CM | POA: Insufficient documentation

## 2015-12-26 DIAGNOSIS — Z862 Personal history of diseases of the blood and blood-forming organs and certain disorders involving the immune mechanism: Secondary | ICD-10-CM | POA: Insufficient documentation

## 2015-12-26 DIAGNOSIS — Z9981 Dependence on supplemental oxygen: Secondary | ICD-10-CM | POA: Insufficient documentation

## 2015-12-26 DIAGNOSIS — Z8701 Personal history of pneumonia (recurrent): Secondary | ICD-10-CM | POA: Diagnosis not present

## 2015-12-26 DIAGNOSIS — F329 Major depressive disorder, single episode, unspecified: Secondary | ICD-10-CM | POA: Diagnosis not present

## 2015-12-26 DIAGNOSIS — I10 Essential (primary) hypertension: Secondary | ICD-10-CM | POA: Diagnosis not present

## 2015-12-26 DIAGNOSIS — Z79899 Other long term (current) drug therapy: Secondary | ICD-10-CM | POA: Diagnosis not present

## 2015-12-26 DIAGNOSIS — N200 Calculus of kidney: Secondary | ICD-10-CM | POA: Diagnosis not present

## 2015-12-26 DIAGNOSIS — G8929 Other chronic pain: Secondary | ICD-10-CM | POA: Insufficient documentation

## 2015-12-26 LAB — URINE MICROSCOPIC-ADD ON

## 2015-12-26 LAB — URINALYSIS, ROUTINE W REFLEX MICROSCOPIC
Glucose, UA: NEGATIVE mg/dL
Ketones, ur: NEGATIVE mg/dL
Nitrite: POSITIVE — AB
PH: 5 (ref 5.0–8.0)
PROTEIN: 100 mg/dL — AB
Specific Gravity, Urine: 1.021 (ref 1.005–1.030)

## 2015-12-26 LAB — I-STAT CHEM 8, ED
BUN: 24 mg/dL — ABNORMAL HIGH (ref 6–20)
Calcium, Ion: 1.11 mmol/L — ABNORMAL LOW (ref 1.13–1.30)
Chloride: 106 mmol/L (ref 101–111)
Creatinine, Ser: 1 mg/dL (ref 0.61–1.24)
Glucose, Bld: 92 mg/dL (ref 65–99)
HCT: 44 % (ref 39.0–52.0)
Hemoglobin: 15 g/dL (ref 13.0–17.0)
Potassium: 3.7 mmol/L (ref 3.5–5.1)
Sodium: 144 mmol/L (ref 135–145)
TCO2: 26 mmol/L (ref 0–100)

## 2015-12-26 MED ORDER — CEPHALEXIN 500 MG PO CAPS: 500.0000 mg | ORAL_CAPSULE | Freq: Two times a day (BID) | ORAL | Status: DC

## 2015-12-26 MED ORDER — LIDOCAINE HCL (PF) 1 % IJ SOLN
2.1000 mL | Freq: Once | INTRAMUSCULAR | Status: AC
  Administered 2015-12-26: 2.1 mL via INTRADERMAL
  Filled 2015-12-26: qty 5

## 2015-12-26 MED ORDER — CEFTRIAXONE SODIUM 1 G IJ SOLR
1.0000 g | INTRAMUSCULAR | Status: DC
Start: 2015-12-26 — End: 2015-12-26
  Administered 2015-12-26: 1 g via INTRAMUSCULAR
  Filled 2015-12-26: qty 10

## 2015-12-26 NOTE — Discharge Instructions (Signed)
Please read attached information. If you experience any new or worsening signs or symptoms please return to the emergency room for evaluation. Please follow-up with your primary care provider or specialist as discussed. Please use medication prescribed only as directed and discontinue taking if you have any concerning signs or symptoms.   °

## 2015-12-26 NOTE — ED Notes (Signed)
Patient transported to CT 

## 2015-12-26 NOTE — ED Notes (Signed)
Pt states he has a hx of bladder cancer and is a a pt of Dr Era Bumpers at [facility]  Pt states he has had multiple surgeries for this in the past  Pt states several weeks ago he had a CT and it showed a bladder tumor that he had removed  Pt states last night he went to the restroom and noticed some blood in his urine  Pt states he went to bed and this morning when he went he still had blood in his urine  Pt denies pain at this time  Pt has been taking Rapaflo 8mg  every evening
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
## 2015-12-29 LAB — URINE CULTURE
Culture: >=100000
Special Requests: NORMAL

## 2015-12-30 ENCOUNTER — Telehealth: Payer: Self-pay | Admitting: *Deleted

## 2015-12-30 NOTE — ED Notes (Signed)
(+)  urine culture, treated with Cephalexin, reviewed and approved by Pharmacy.

## 2016-01-03 DIAGNOSIS — H43812 Vitreous degeneration, left eye: Secondary | ICD-10-CM | POA: Diagnosis not present

## 2016-01-03 DIAGNOSIS — H35372 Puckering of macula, left eye: Secondary | ICD-10-CM | POA: Diagnosis not present

## 2016-01-03 DIAGNOSIS — H35432 Paving stone degeneration of retina, left eye: Secondary | ICD-10-CM | POA: Diagnosis not present

## 2016-01-03 DIAGNOSIS — H43392 Other vitreous opacities, left eye: Secondary | ICD-10-CM | POA: Diagnosis not present

## 2016-01-31 DIAGNOSIS — B3789 Other sites of candidiasis: Secondary | ICD-10-CM | POA: Diagnosis not present

## 2016-01-31 DIAGNOSIS — L304 Erythema intertrigo: Secondary | ICD-10-CM | POA: Diagnosis not present

## 2016-02-03 ENCOUNTER — Other Ambulatory Visit (HOSPITAL_COMMUNITY): Payer: Self-pay | Admitting: Psychiatry

## 2016-02-05 ENCOUNTER — Telehealth (HOSPITAL_COMMUNITY): Payer: Self-pay | Admitting: *Deleted

## 2016-02-05 DIAGNOSIS — C673 Malignant neoplasm of anterior wall of bladder: Secondary | ICD-10-CM | POA: Diagnosis not present

## 2016-02-05 DIAGNOSIS — C675 Malignant neoplasm of bladder neck: Secondary | ICD-10-CM | POA: Diagnosis not present

## 2016-02-05 DIAGNOSIS — Z0000 Encounter for general adult medical examination without abnormal findings: Secondary | ICD-10-CM | POA: Diagnosis not present

## 2016-02-05 NOTE — Telephone Encounter (Signed)
Pt called stating he is out of his Diazepam. Per pt, he found his printed script that provider gived him and turned it into the pharmacy. Per pt, he went to the pharmacy to pick up his printed script but the pharmacy stated he just picked up his script on 01-12-16 and should have 14 tablets left. Per pt, he remember cleaning out his medication bottles and don't know if he accidentally tossed it. Per pt, he has been out of this for 3 days now. Per pt he would like to know if Dr Harrington Challenger could call pharmacy to approve an early fill. Per pt, he was told to not stop this medication and he don't want to start having side effects. Called pt pharmacy and spoke with Manuela Schwartz and she stated pt did pick up his Valium on the 15th of January and they cannot fill his medication until Feb 12th. Per Manuela Schwartz due to the type of medication pt is requesting they told him their policy is they can only fill 28 days after pick up date and no sooner unless their manager approves it and she is not there today. Per pt, he would like office to call him back on his cell [phone]. Per pt, he would like to know if there's anything Dr. Harrington Challenger could do because he is out of his medication.

## 2016-02-06 NOTE — Telephone Encounter (Signed)
We cannot fill controlled drugs early

## 2016-02-06 NOTE — Telephone Encounter (Signed)
Called pt and informed him of what Dr. Harrington Challenger stated and he showed understanding. Per pt what if he starts having side effects before going back to the pharmacy to pick up meds. Informed pt if he should have any side effects before his next pick up which is Feb 15th, he should call office back. Pt showed understanding and agreed.

## 2016-03-17 ENCOUNTER — Ambulatory Visit (INDEPENDENT_AMBULATORY_CARE_PROVIDER_SITE_OTHER): Payer: Medicare Other | Admitting: Psychiatry

## 2016-03-17 ENCOUNTER — Encounter (HOSPITAL_COMMUNITY): Payer: Self-pay | Admitting: Psychiatry

## 2016-03-17 VITALS — BP 150/89 | HR 71 | Ht 72.5 in | Wt 232.4 lb

## 2016-03-17 DIAGNOSIS — F331 Major depressive disorder, recurrent, moderate: Secondary | ICD-10-CM | POA: Diagnosis not present

## 2016-03-17 MED ORDER — DIAZEPAM 5 MG PO TABS: 5.0000 mg | ORAL_TABLET | Freq: Two times a day (BID) | ORAL | Status: DC | PRN

## 2016-03-17 MED ORDER — ESCITALOPRAM OXALATE 10 MG PO TABS: 10.0000 mg | ORAL_TABLET | Freq: Every day | ORAL | Status: DC
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
## 2016-04-13 ENCOUNTER — Other Ambulatory Visit (HOSPITAL_COMMUNITY): Payer: Self-pay | Admitting: Psychiatry

## 2016-04-15 DIAGNOSIS — C44729 Squamous cell carcinoma of skin of left lower limb, including hip: Secondary | ICD-10-CM | POA: Diagnosis not present

## 2016-04-15 DIAGNOSIS — L821 Other seborrheic keratosis: Secondary | ICD-10-CM | POA: Diagnosis not present

## 2016-04-15 DIAGNOSIS — D225 Melanocytic nevi of trunk: Secondary | ICD-10-CM | POA: Diagnosis not present

## 2016-04-29 DIAGNOSIS — M5430 Sciatica, unspecified side: Secondary | ICD-10-CM | POA: Diagnosis not present

## 2016-05-04 ENCOUNTER — Other Ambulatory Visit (HOSPITAL_COMMUNITY): Payer: Self-pay | Admitting: Internal Medicine

## 2016-05-04 ENCOUNTER — Encounter: Payer: Self-pay | Admitting: Orthopedic Surgery

## 2016-05-04 ENCOUNTER — Ambulatory Visit (INDEPENDENT_AMBULATORY_CARE_PROVIDER_SITE_OTHER): Payer: Medicare Other | Admitting: Orthopedic Surgery

## 2016-05-04 ENCOUNTER — Ambulatory Visit (HOSPITAL_COMMUNITY)
Admission: RE | Admit: 2016-05-04 | Discharge: 2016-05-04 | Disposition: A | Discharge status: Home / Self Care | Payer: Medicare Other | Source: Ambulatory Visit | Attending: Internal Medicine | Admitting: Internal Medicine

## 2016-05-04 VITALS — BP 163/88 | Ht 73.0 in | Wt 235.0 lb

## 2016-05-04 DIAGNOSIS — S52515A Nondisplaced fracture of left radial styloid process, initial encounter for closed fracture: Secondary | ICD-10-CM | POA: Diagnosis not present

## 2016-05-04 DIAGNOSIS — W19XXXA Unspecified fall, initial encounter: Secondary | ICD-10-CM

## 2016-05-04 DIAGNOSIS — M25532 Pain in left wrist: Secondary | ICD-10-CM | POA: Insufficient documentation

## 2016-05-04 DIAGNOSIS — S52512A Displaced fracture of left radial styloid process, initial encounter for closed fracture: Secondary | ICD-10-CM | POA: Diagnosis not present

## 2016-05-04 DIAGNOSIS — X58XXXA Exposure to other specified factors, initial encounter: Secondary | ICD-10-CM | POA: Insufficient documentation

## 2016-05-04 MED ORDER — OXYCODONE-ACETAMINOPHEN 5-325 MG PO TABS: 1.0000 | ORAL_TABLET | ORAL | Status: DC | PRN
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
## 2016-05-17 ENCOUNTER — Emergency Department (HOSPITAL_COMMUNITY)
Admission: EM | Admit: 2016-05-17 | Discharge: 2016-05-17 | Disposition: A | Discharge status: Home / Self Care | Payer: Medicare Other | Attending: Emergency Medicine | Admitting: Emergency Medicine

## 2016-05-17 ENCOUNTER — Emergency Department (HOSPITAL_COMMUNITY): Payer: Medicare Other

## 2016-05-17 ENCOUNTER — Encounter (HOSPITAL_COMMUNITY): Payer: Self-pay

## 2016-05-17 DIAGNOSIS — Z87442 Personal history of urinary calculi: Secondary | ICD-10-CM | POA: Insufficient documentation

## 2016-05-17 DIAGNOSIS — Z8551 Personal history of malignant neoplasm of bladder: Secondary | ICD-10-CM | POA: Diagnosis not present

## 2016-05-17 DIAGNOSIS — R011 Cardiac murmur, unspecified: Secondary | ICD-10-CM | POA: Insufficient documentation

## 2016-05-17 DIAGNOSIS — F329 Major depressive disorder, single episode, unspecified: Secondary | ICD-10-CM | POA: Insufficient documentation

## 2016-05-17 DIAGNOSIS — R42 Dizziness and giddiness: Secondary | ICD-10-CM | POA: Insufficient documentation

## 2016-05-17 DIAGNOSIS — Z79899 Other long term (current) drug therapy: Secondary | ICD-10-CM | POA: Insufficient documentation

## 2016-05-17 DIAGNOSIS — F419 Anxiety disorder, unspecified: Secondary | ICD-10-CM | POA: Insufficient documentation

## 2016-05-17 DIAGNOSIS — R531 Weakness: Secondary | ICD-10-CM | POA: Diagnosis present

## 2016-05-17 DIAGNOSIS — E119 Type 2 diabetes mellitus without complications: Secondary | ICD-10-CM | POA: Insufficient documentation

## 2016-05-17 DIAGNOSIS — Z87891 Personal history of nicotine dependence: Secondary | ICD-10-CM | POA: Diagnosis not present

## 2016-05-17 DIAGNOSIS — R2689 Other abnormalities of gait and mobility: Secondary | ICD-10-CM | POA: Insufficient documentation

## 2016-05-17 DIAGNOSIS — G8929 Other chronic pain: Secondary | ICD-10-CM | POA: Diagnosis not present

## 2016-05-17 DIAGNOSIS — R0602 Shortness of breath: Secondary | ICD-10-CM | POA: Insufficient documentation

## 2016-05-17 DIAGNOSIS — R05 Cough: Secondary | ICD-10-CM | POA: Diagnosis not present

## 2016-05-17 DIAGNOSIS — I10 Essential (primary) hypertension: Secondary | ICD-10-CM | POA: Insufficient documentation

## 2016-05-17 DIAGNOSIS — R0989 Other specified symptoms and signs involving the circulatory and respiratory systems: Secondary | ICD-10-CM | POA: Insufficient documentation

## 2016-05-17 LAB — URINALYSIS, ROUTINE W REFLEX MICROSCOPIC
Bilirubin Urine: NEGATIVE
GLUCOSE, UA: NEGATIVE mg/dL
KETONES UR: NEGATIVE mg/dL
NITRITE: NEGATIVE
PROTEIN: 30 mg/dL — AB
Specific Gravity, Urine: 1.02 (ref 1.005–1.030)
pH: 5 (ref 5.0–8.0)

## 2016-05-17 LAB — BASIC METABOLIC PANEL
Anion gap: 6 (ref 5–15)
BUN: 18 mg/dL (ref 6–20)
CHLORIDE: 106 mmol/L (ref 101–111)
CO2: 26 mmol/L (ref 22–32)
CREATININE: 1.16 mg/dL (ref 0.61–1.24)
Calcium: 9.1 mg/dL (ref 8.9–10.3)
Glucose, Bld: 100 mg/dL — ABNORMAL HIGH (ref 65–99)
POTASSIUM: 3.9 mmol/L (ref 3.5–5.1)
SODIUM: 138 mmol/L (ref 135–145)

## 2016-05-17 LAB — CBC
HEMATOCRIT: 47 % (ref 39.0–52.0)
Hemoglobin: 15.4 g/dL (ref 13.0–17.0)
MCH: 29.4 pg (ref 26.0–34.0)
MCHC: 32.8 g/dL (ref 30.0–36.0)
MCV: 89.7 fL (ref 78.0–100.0)
PLATELETS: 206 10*3/uL (ref 150–400)
RBC: 5.24 MIL/uL (ref 4.22–5.81)
RDW: 13.9 % (ref 11.5–15.5)
WBC: 9.8 10*3/uL (ref 4.0–10.5)

## 2016-05-17 LAB — URINE MICROSCOPIC-ADD ON

## 2016-05-17 LAB — I-STAT TROPONIN, ED: TROPONIN I, POC: 0 ng/mL (ref 0.00–0.08)

## 2016-05-17 MED ORDER — SODIUM CHLORIDE 0.9 % IV BOLUS (SEPSIS)
1000.0000 mL | Freq: Once | INTRAVENOUS | Status: AC
  Administered 2016-05-17: 1000 mL via INTRAVENOUS

## 2016-05-17 NOTE — Discharge Instructions (Signed)

## 2016-05-17 NOTE — ED Notes (Signed)
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 Reports that the paramedics saw him and told him his BP was low and HR high. Denies CP.
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
## 2016-05-18 ENCOUNTER — Other Ambulatory Visit (HOSPITAL_COMMUNITY): Payer: Self-pay | Admitting: Psychiatry

## 2016-05-20 DIAGNOSIS — Z08 Encounter for follow-up examination after completed treatment for malignant neoplasm: Secondary | ICD-10-CM | POA: Diagnosis not present

## 2016-05-20 DIAGNOSIS — Z85828 Personal history of other malignant neoplasm of skin: Secondary | ICD-10-CM | POA: Diagnosis not present

## 2016-05-24 ENCOUNTER — Other Ambulatory Visit (HOSPITAL_COMMUNITY): Payer: Self-pay | Admitting: Psychiatry

## 2016-05-28 DIAGNOSIS — Z125 Encounter for screening for malignant neoplasm of prostate: Secondary | ICD-10-CM | POA: Diagnosis not present

## 2016-05-28 DIAGNOSIS — E1165 Type 2 diabetes mellitus with hyperglycemia: Secondary | ICD-10-CM | POA: Diagnosis not present

## 2016-05-28 DIAGNOSIS — E782 Mixed hyperlipidemia: Secondary | ICD-10-CM | POA: Diagnosis not present

## 2016-05-29 DIAGNOSIS — E119 Type 2 diabetes mellitus without complications: Secondary | ICD-10-CM | POA: Diagnosis not present

## 2016-05-29 DIAGNOSIS — E782 Mixed hyperlipidemia: Secondary | ICD-10-CM | POA: Diagnosis not present

## 2016-05-29 DIAGNOSIS — I10 Essential (primary) hypertension: Secondary | ICD-10-CM | POA: Diagnosis not present

## 2016-06-01 ENCOUNTER — Ambulatory Visit: Payer: Medicare Other | Admitting: Orthopedic Surgery

## 2016-06-01 ENCOUNTER — Ambulatory Visit (INDEPENDENT_AMBULATORY_CARE_PROVIDER_SITE_OTHER): Payer: Medicare Other

## 2016-06-01 VITALS — BP 146/80 | Ht 72.0 in | Wt 230.0 lb

## 2016-06-01 DIAGNOSIS — S62102D Fracture of unspecified carpal bone, left wrist, subsequent encounter for fracture with routine healing: Secondary | ICD-10-CM

## 2016-06-01 NOTE — Patient Instructions (Signed)
Wear brace full-time except for bathing

## 2016-06-01 NOTE — Progress Notes (Signed)
Chief Complaint  Patient presents with  . Follow-up    LEFT WRIST FRACTURE, DOI 05/04/16   BP 146/80 mmHg  Ht 6' (1.829 m)  Wt 230 lb (104.327 kg)  BMI 31.19 kg/m2  Left wrist pain much improved x-ray shows fracture healing no displacement  X-ray shows a radial styloid type fracture minimally displaced  Clinical exam shows pain with wrist extension flexion and tenderness at the fracture site  Recommend removable brace  Addendum patient had back surgery many years ago it was a workers comp and Sensation case, Dr. Phillip Heal did the surgery. He has recent onset of bilateral leg pain and lower back pain  He received an injection and that was done under muscular with the steroid Dosepak no improvement  He will need follow-up for that. WITH DR Saintclair Halsted

## 2016-06-02 ENCOUNTER — Encounter (INDEPENDENT_AMBULATORY_CARE_PROVIDER_SITE_OTHER): Payer: Self-pay | Admitting: *Deleted

## 2016-06-06 DIAGNOSIS — L989 Disorder of the skin and subcutaneous tissue, unspecified: Secondary | ICD-10-CM | POA: Diagnosis not present

## 2016-06-09 DIAGNOSIS — N401 Enlarged prostate with lower urinary tract symptoms: Secondary | ICD-10-CM | POA: Diagnosis not present

## 2016-06-09 DIAGNOSIS — C671 Malignant neoplasm of dome of bladder: Secondary | ICD-10-CM | POA: Diagnosis not present

## 2016-06-09 DIAGNOSIS — C670 Malignant neoplasm of trigone of bladder: Secondary | ICD-10-CM | POA: Diagnosis not present

## 2016-06-09 DIAGNOSIS — C673 Malignant neoplasm of anterior wall of bladder: Secondary | ICD-10-CM | POA: Diagnosis not present

## 2016-06-11 DIAGNOSIS — M5137 Other intervertebral disc degeneration, lumbosacral region: Secondary | ICD-10-CM | POA: Diagnosis not present

## 2016-06-12 DIAGNOSIS — M4806 Spinal stenosis, lumbar region: Secondary | ICD-10-CM | POA: Diagnosis not present

## 2016-06-12 DIAGNOSIS — M5137 Other intervertebral disc degeneration, lumbosacral region: Secondary | ICD-10-CM | POA: Diagnosis not present

## 2016-06-16 ENCOUNTER — Other Ambulatory Visit: Payer: Self-pay | Admitting: Neurosurgery

## 2016-06-16 DIAGNOSIS — M4316 Spondylolisthesis, lumbar region: Secondary | ICD-10-CM | POA: Diagnosis not present

## 2016-06-16 DIAGNOSIS — M5136 Other intervertebral disc degeneration, lumbar region: Secondary | ICD-10-CM | POA: Diagnosis not present

## 2016-06-17 ENCOUNTER — Ambulatory Visit (INDEPENDENT_AMBULATORY_CARE_PROVIDER_SITE_OTHER): Payer: Medicare Other | Admitting: Psychiatry

## 2016-06-17 ENCOUNTER — Encounter (HOSPITAL_COMMUNITY): Payer: Self-pay | Admitting: Psychiatry

## 2016-06-17 VITALS — BP 144/73 | HR 61 | Ht 72.0 in | Wt 221.0 lb

## 2016-06-17 DIAGNOSIS — F331 Major depressive disorder, recurrent, moderate: Secondary | ICD-10-CM

## 2016-06-17 MED ORDER — DIAZEPAM 5 MG PO TABS: 5.0000 mg | ORAL_TABLET | Freq: Two times a day (BID) | ORAL | Status: DC | PRN

## 2016-06-17 MED ORDER — ESCITALOPRAM OXALATE 10 MG PO TABS: 10.0000 mg | ORAL_TABLET | Freq: Every day | ORAL | Status: DC
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
## 2016-07-02 ENCOUNTER — Ambulatory Visit (INDEPENDENT_AMBULATORY_CARE_PROVIDER_SITE_OTHER): Payer: Medicare Other

## 2016-07-02 ENCOUNTER — Encounter: Payer: Self-pay | Admitting: Orthopedic Surgery

## 2016-07-02 ENCOUNTER — Ambulatory Visit: Payer: Medicare Other | Admitting: Orthopedic Surgery

## 2016-07-02 VITALS — BP 127/82 | HR 74 | Temp 97.7°F | Ht 72.0 in | Wt 220.5 lb

## 2016-07-02 DIAGNOSIS — S62102D Fracture of unspecified carpal bone, left wrist, subsequent encounter for fracture with routine healing: Secondary | ICD-10-CM

## 2016-07-02 NOTE — Progress Notes (Signed)
Chief complaint follow-up status post left wrist fracture treated with cast  Date of injury was 05/04/2016  Today's x-ray shows that the fracture has healed  His left wrist is no longer tender although he had some pain when he closed his hand to make a strong fist  Otherwise he's doing well ankle and be released

## 2016-07-07 ENCOUNTER — Encounter (HOSPITAL_COMMUNITY)
Admission: RE | Admit: 2016-07-07 | Discharge: 2016-07-07 | Disposition: A | Discharge status: Home / Self Care | Payer: Medicare Other | Source: Ambulatory Visit | Attending: Neurosurgery | Admitting: Neurosurgery

## 2016-07-07 ENCOUNTER — Encounter (HOSPITAL_COMMUNITY): Payer: Self-pay

## 2016-07-07 DIAGNOSIS — Z01812 Encounter for preprocedural laboratory examination: Secondary | ICD-10-CM | POA: Insufficient documentation

## 2016-07-07 DIAGNOSIS — I10 Essential (primary) hypertension: Secondary | ICD-10-CM | POA: Diagnosis not present

## 2016-07-07 DIAGNOSIS — E119 Type 2 diabetes mellitus without complications: Secondary | ICD-10-CM | POA: Insufficient documentation

## 2016-07-07 DIAGNOSIS — G4733 Obstructive sleep apnea (adult) (pediatric): Secondary | ICD-10-CM | POA: Insufficient documentation

## 2016-07-07 DIAGNOSIS — F329 Major depressive disorder, single episode, unspecified: Secondary | ICD-10-CM | POA: Insufficient documentation

## 2016-07-07 HISTORY — DX: Pain in unspecified joint: M25.50

## 2016-07-07 HISTORY — DX: Prediabetes: R73.03

## 2016-07-07 LAB — CBC
HCT: 45 % (ref 39.0–52.0)
HEMOGLOBIN: 14.9 g/dL (ref 13.0–17.0)
MCH: 30.3 pg (ref 26.0–34.0)
MCHC: 33.1 g/dL (ref 30.0–36.0)
MCV: 91.6 fL (ref 78.0–100.0)
Platelets: 202 10*3/uL (ref 150–400)
RBC: 4.91 MIL/uL (ref 4.22–5.81)
RDW: 14.3 % (ref 11.5–15.5)
WBC: 8.6 10*3/uL (ref 4.0–10.5)

## 2016-07-07 LAB — TYPE AND SCREEN
ABO/RH(D): O POS
ANTIBODY SCREEN: NEGATIVE

## 2016-07-07 LAB — BASIC METABOLIC PANEL
ANION GAP: 8 (ref 5–15)
BUN: 17 mg/dL (ref 6–20)
CALCIUM: 8.9 mg/dL (ref 8.9–10.3)
CO2: 26 mmol/L (ref 22–32)
Chloride: 107 mmol/L (ref 101–111)
Creatinine, Ser: 1.13 mg/dL (ref 0.61–1.24)
Glucose, Bld: 108 mg/dL — ABNORMAL HIGH (ref 65–99)
Potassium: 3.6 mmol/L (ref 3.5–5.1)
SODIUM: 141 mmol/L (ref 135–145)

## 2016-07-07 LAB — SURGICAL PCR SCREEN
MRSA, PCR: NEGATIVE
STAPHYLOCOCCUS AUREUS: NEGATIVE

## 2016-07-07 LAB — ABO/RH: ABO/RH(D): O POS

## 2016-07-07 MED ORDER — CHLORHEXIDINE GLUCONATE CLOTH 2 % EX PADS: 6.0000 | MEDICATED_PAD | Freq: Once | CUTANEOUS | Status: DC

## 2016-07-07 NOTE — Progress Notes (Addendum)
Cardiologist saw one in 2013 but was told everything looked good and no further work up needed  Medical Md is Dr.Zach [name]  Echo report in epic from 2013  Stress test report in epic from 2013  EKG and CXR report in epic from 05-17-16  Heart cath denies

## 2016-07-07 NOTE — Pre-Procedure Instructions (Signed)
Zachary [name]  07/07/2016      [id number] - [place], [place] - [address] AT [address] [place] [address] Phone: [phone] Fax: [phone]    Your procedure is scheduled on Wed, July 19 @ 8:45 AM  Report to [facility] Admitting at 6:45 AM  Call this number if you have problems the morning of surgery:  [phone]   Remember:  Do not eat food or drink liquids after midnight.  Take these medicines the morning of surgery with A SIP OF WATER Valium(Diazepam),Lexaprol(Escitalopram),Flonase(Fluticasone),Pain Pill(if needed),and Rafaflo(Silodosin)             Stop taking Ibuprofen. No Goody's,BC's,Aleve,Advil,Motrin,Aspirin,Fish Oil,or any Herbal Medications.    Do not wear jewelry.  Do not wear lotions, powders, or colognes.    Men may shave face and neck.  Do not bring valuables to the hospital.  [facility] is not responsible for any belongings or valuables.  Contacts, dentures or bridgework may not be worn into surgery.  Leave your suitcase in the car.  After surgery it may be brought to your room.  For patients admitted to the hospital, discharge time will be determined by your treatment team.  Patients discharged the day of surgery will not be allowed to drive home.    [facility]  Before surgery, you can play an important role.  Because skin is not sterile, your skin needs to be as free of germs as possible.  You can reduce the number of germs on you skin by washing with CHG (chlorahexidine gluconate) soap before surgery.  CHG is an antiseptic cleaner which kills germs and bonds with the skin to continue killing germs even after washing.  Please DO NOT use if you have an allergy to CHG or antibacterial soaps.  If your skin becomes reddened/irritated stop using the CHG and inform your nurse when you arrive at Short Stay.  Do not shave (including legs  and underarms) for at least 48 hours prior to the first CHG shower.  You may shave your face.  Please follow these instructions carefully:   1.  Shower with CHG Soap the night before surgery and the                                morning of Surgery.  2.  If you choose to wash your hair, wash your hair first as usual with your       normal shampoo.  3.  After you shampoo, rinse your hair and body thoroughly to remove the                      Shampoo.  4.  Use CHG as you would any other liquid soap.  You can apply chg directly       to the skin and wash gently with scrungie or a clean washcloth.  5.  Apply the CHG Soap to your body ONLY FROM THE NECK DOWN.        Do not use on open wounds or open sores.  Avoid contact with your eyes,       ears, mouth and genitals (private parts).  Wash genitals (private parts)       with your normal soap.  6.  Wash thoroughly, paying special attention to  the area where your surgery        will be performed.  7.  Thoroughly rinse your body with warm water from the neck down.  8.  DO NOT shower/wash with your normal soap after using and rinsing off       the CHG Soap.  9.  Pat yourself dry with a clean towel.            10.  Wear clean pajamas.            11.  Place clean sheets on your bed the night of your first shower and do not        sleep with pets.  Day of Surgery  Do not apply any lotions/deoderants the morning of surgery.  Please wear clean clothes to the hospital/surgery center.  ons:    Please read over the following fact sheets that you were given. Pain Booklet, MRSA Information and Surgical Site Infection Prevention

## 2016-07-08 ENCOUNTER — Encounter (HOSPITAL_COMMUNITY): Payer: Self-pay

## 2016-07-08 NOTE — Progress Notes (Signed)
Anesthesia Chart Review:  Pt is a 70 year old male scheduled for L3-4, L4-5 PLIF on 07/15/2016 with Kary Kos, MD.   PCP is Delphina Cahill, MD.   PMH includes:  HTN, prediabetes, OSA, bladder cancer, post-op N/V. Former smoker. BMI 30.5.  S/p TURBT 07/27/15. A/p anterior cervical discectomy 06/05/13. S/p cataract extraction 01/09/13, 12/19/12.   Medications include: valsartan-hctz.   Preoperative labs reviewed.    Chest x-ray 05/17/16 reviewed.  1. Mild left basilar atelectasis. No acute consolidative airspace disease to suggest a pneumonia. 2. Mildly hyperinflated lungs, suggesting obstructive lung disease.  EKG 05/17/16: NSR. RBBB  Echo 02/18/12:  - Left ventricle: The cavity size was normal. Wall thickness was increased in a pattern of mild LVH. Systolic function was normal. The estimated ejection fraction was in the range of 60% to 65%. Wall motion was normal; there were no regional wall motion abnormalities. Doppler parameters are consistent with abnormal left ventricular relaxation (grade 1 diastolic dysfunction). - Aortic valve: Trileaflet; mildly thickened leaflets. Trivial regurgitation. - Mitral valve: Calcified annulus. Trivial regurgitation. - Left atrium: The atrium was mildly dilated. - Right ventricle: The cavity size was mildly dilated. Systolic function was normal. - Tricuspid valve: Trivial regurgitation.  Nuclear stress test 02/18/12:  - Low risk Lexiscan Myoview. There were no diagnostic ST-segment changes or arrhythmias. Perfusion imaging shows soft tissue attenuation, with no clear evidence of scar or ischemia. LVEF is normal 68%.  If no changes, I anticipate pt can proceed with surgery as scheduled.   Willeen Cass, FNP-BC [facility] Short Stay Surgical Center/Anesthesiology Phone: [phone] 07/08/2016 12:47 PM

## 2016-07-15 ENCOUNTER — Inpatient Hospital Stay (HOSPITAL_COMMUNITY): Payer: Medicare Other | Admitting: Emergency Medicine

## 2016-07-15 ENCOUNTER — Inpatient Hospital Stay (HOSPITAL_COMMUNITY): Payer: Medicare Other | Admitting: Anesthesiology

## 2016-07-15 ENCOUNTER — Inpatient Hospital Stay (HOSPITAL_COMMUNITY): Payer: Medicare Other

## 2016-07-15 ENCOUNTER — Encounter (HOSPITAL_COMMUNITY): Payer: Self-pay | Admitting: Anesthesiology

## 2016-07-15 ENCOUNTER — Inpatient Hospital Stay (HOSPITAL_COMMUNITY)
Admission: RE | Admit: 2016-07-15 | Discharge: 2016-07-20 | DRG: 459 | Disposition: A | Discharge status: Skilled Nursing Facility | Payer: Medicare Other | Source: Ambulatory Visit | Attending: Neurosurgery | Admitting: Neurosurgery

## 2016-07-15 ENCOUNTER — Encounter (HOSPITAL_COMMUNITY)
Admission: RE | Disposition: A | Discharge status: Skilled Nursing Facility | Payer: Self-pay | Source: Ambulatory Visit | Attending: Neurosurgery

## 2016-07-15 DIAGNOSIS — Z882 Allergy status to sulfonamides status: Secondary | ICD-10-CM | POA: Diagnosis not present

## 2016-07-15 DIAGNOSIS — E876 Hypokalemia: Secondary | ICD-10-CM

## 2016-07-15 DIAGNOSIS — R000 Tachycardia, unspecified: Secondary | ICD-10-CM | POA: Diagnosis present

## 2016-07-15 DIAGNOSIS — Z8551 Personal history of malignant neoplasm of bladder: Secondary | ICD-10-CM

## 2016-07-15 DIAGNOSIS — I9788 Other intraoperative complications of the circulatory system, not elsewhere classified: Secondary | ICD-10-CM | POA: Diagnosis not present

## 2016-07-15 DIAGNOSIS — F438 Other reactions to severe stress: Secondary | ICD-10-CM | POA: Diagnosis not present

## 2016-07-15 DIAGNOSIS — R7303 Prediabetes: Secondary | ICD-10-CM | POA: Diagnosis not present

## 2016-07-15 DIAGNOSIS — M48061 Spinal stenosis, lumbar region without neurogenic claudication: Secondary | ICD-10-CM | POA: Diagnosis present

## 2016-07-15 DIAGNOSIS — Z419 Encounter for procedure for purposes other than remedying health state, unspecified: Secondary | ICD-10-CM

## 2016-07-15 DIAGNOSIS — Z87891 Personal history of nicotine dependence: Secondary | ICD-10-CM

## 2016-07-15 DIAGNOSIS — M5136 Other intervertebral disc degeneration, lumbar region: Secondary | ICD-10-CM | POA: Diagnosis not present

## 2016-07-15 DIAGNOSIS — I959 Hypotension, unspecified: Secondary | ICD-10-CM

## 2016-07-15 DIAGNOSIS — F419 Anxiety disorder, unspecified: Secondary | ICD-10-CM | POA: Diagnosis present

## 2016-07-15 DIAGNOSIS — M549 Dorsalgia, unspecified: Secondary | ICD-10-CM | POA: Diagnosis not present

## 2016-07-15 DIAGNOSIS — G4733 Obstructive sleep apnea (adult) (pediatric): Secondary | ICD-10-CM | POA: Diagnosis present

## 2016-07-15 DIAGNOSIS — J9602 Acute respiratory failure with hypercapnia: Secondary | ICD-10-CM | POA: Diagnosis not present

## 2016-07-15 DIAGNOSIS — G44009 Cluster headache syndrome, unspecified, not intractable: Secondary | ICD-10-CM | POA: Diagnosis not present

## 2016-07-15 DIAGNOSIS — N400 Enlarged prostate without lower urinary tract symptoms: Secondary | ICD-10-CM | POA: Diagnosis present

## 2016-07-15 DIAGNOSIS — M5116 Intervertebral disc disorders with radiculopathy, lumbar region: Secondary | ICD-10-CM | POA: Diagnosis not present

## 2016-07-15 DIAGNOSIS — M532X6 Spinal instabilities, lumbar region: Secondary | ICD-10-CM | POA: Diagnosis not present

## 2016-07-15 DIAGNOSIS — D62 Acute posthemorrhagic anemia: Secondary | ICD-10-CM | POA: Diagnosis not present

## 2016-07-15 DIAGNOSIS — R262 Difficulty in walking, not elsewhere classified: Secondary | ICD-10-CM | POA: Diagnosis not present

## 2016-07-15 DIAGNOSIS — M6281 Muscle weakness (generalized): Secondary | ICD-10-CM | POA: Diagnosis not present

## 2016-07-15 DIAGNOSIS — Z8249 Family history of ischemic heart disease and other diseases of the circulatory system: Secondary | ICD-10-CM

## 2016-07-15 DIAGNOSIS — I4510 Unspecified right bundle-branch block: Secondary | ICD-10-CM | POA: Diagnosis present

## 2016-07-15 DIAGNOSIS — J9600 Acute respiratory failure, unspecified whether with hypoxia or hypercapnia: Secondary | ICD-10-CM

## 2016-07-15 DIAGNOSIS — M4806 Spinal stenosis, lumbar region: Secondary | ICD-10-CM

## 2016-07-15 DIAGNOSIS — N200 Calculus of kidney: Secondary | ICD-10-CM | POA: Diagnosis not present

## 2016-07-15 DIAGNOSIS — J9690 Respiratory failure, unspecified, unspecified whether with hypoxia or hypercapnia: Secondary | ICD-10-CM | POA: Diagnosis not present

## 2016-07-15 DIAGNOSIS — M1990 Unspecified osteoarthritis, unspecified site: Secondary | ICD-10-CM | POA: Diagnosis present

## 2016-07-15 DIAGNOSIS — M4316 Spondylolisthesis, lumbar region: Secondary | ICD-10-CM | POA: Diagnosis not present

## 2016-07-15 DIAGNOSIS — I10 Essential (primary) hypertension: Secondary | ICD-10-CM | POA: Diagnosis present

## 2016-07-15 DIAGNOSIS — R293 Abnormal posture: Secondary | ICD-10-CM | POA: Diagnosis not present

## 2016-07-15 DIAGNOSIS — I9589 Other hypotension: Secondary | ICD-10-CM

## 2016-07-15 DIAGNOSIS — G8929 Other chronic pain: Secondary | ICD-10-CM | POA: Diagnosis present

## 2016-07-15 DIAGNOSIS — M4326 Fusion of spine, lumbar region: Secondary | ICD-10-CM | POA: Diagnosis not present

## 2016-07-15 DIAGNOSIS — Z79899 Other long term (current) drug therapy: Secondary | ICD-10-CM | POA: Diagnosis not present

## 2016-07-15 DIAGNOSIS — M5126 Other intervertebral disc displacement, lumbar region: Secondary | ICD-10-CM | POA: Diagnosis not present

## 2016-07-15 DIAGNOSIS — F329 Major depressive disorder, single episode, unspecified: Secondary | ICD-10-CM | POA: Diagnosis present

## 2016-07-15 DIAGNOSIS — R279 Unspecified lack of coordination: Secondary | ICD-10-CM | POA: Diagnosis not present

## 2016-07-15 DIAGNOSIS — F411 Generalized anxiety disorder: Secondary | ICD-10-CM | POA: Diagnosis not present

## 2016-07-15 DIAGNOSIS — Z48811 Encounter for surgical aftercare following surgery on the nervous system: Secondary | ICD-10-CM | POA: Diagnosis not present

## 2016-07-15 DIAGNOSIS — R0902 Hypoxemia: Secondary | ICD-10-CM | POA: Diagnosis not present

## 2016-07-15 LAB — ECHOCARDIOGRAM COMPLETE
CHL CUP DOP CALC LVOT VTI: 19.1 cm
CHL CUP MV DEC (S): 458
CHL CUP RV SYS PRESS: 17 mmHg
CHL CUP STROKE VOLUME: 41 mL
E decel time: 458 msec
EERAT: 12.48
FS: 39 % (ref 28–44)
IV/PV OW: 1.12
LA ID, A-P, ES: 33 mm
LA diam index: 1.47 cm/m2
LA vol A4C: 47.5 ml
LDCA: 3.46 cm2
LEFT ATRIUM END SYS DIAM: 33 mm
LV E/e' medial: 12.48
LV E/e'average: 12.48
LV PW d: 14.6 mm — AB (ref 0.6–1.1)
LV SIMPSON'S DISK: 63
LV sys vol index: 11 mL/m2
LV sys vol: 24 mL (ref 21–61)
LVDIAVOL: 65 mL (ref 62–150)
LVDIAVOLIN: 29 mL/m2
LVELAT: 5.33 cm/s
LVOT diameter: 21 mm
LVOTPV: 87.2 cm/s
LVOTSV: 66 mL
MV pk A vel: 89.7 m/s
MV pk E vel: 66.5 m/s
Reg peak vel: 189 cm/s
TAPSE: 18.2 mm
TDI e' lateral: 5.33
TDI e' medial: 5
TR max vel: 189 cm/s
WEIGHTICAEL: 3584 [oz_av]

## 2016-07-15 LAB — CBC WITH DIFFERENTIAL/PLATELET
Basophils Absolute: 0 10*3/uL (ref 0.0–0.1)
Basophils Relative: 0 %
EOS ABS: 0 10*3/uL (ref 0.0–0.7)
EOS PCT: 0 %
HCT: 34.1 % — ABNORMAL LOW (ref 39.0–52.0)
Hemoglobin: 11.1 g/dL — ABNORMAL LOW (ref 13.0–17.0)
LYMPHS ABS: 0.4 10*3/uL — AB (ref 0.7–4.0)
LYMPHS PCT: 2 %
MCH: 29.1 pg (ref 26.0–34.0)
MCHC: 32.6 g/dL (ref 30.0–36.0)
MCV: 89.3 fL (ref 78.0–100.0)
MONO ABS: 0.6 10*3/uL (ref 0.1–1.0)
Monocytes Relative: 3 %
Neutro Abs: 19.5 10*3/uL — ABNORMAL HIGH (ref 1.7–7.7)
Neutrophils Relative %: 95 %
PLATELETS: 164 10*3/uL (ref 150–400)
RBC: 3.82 MIL/uL — AB (ref 4.22–5.81)
RDW: 13.4 % (ref 11.5–15.5)
WBC: 20.6 10*3/uL — AB (ref 4.0–10.5)

## 2016-07-15 LAB — BASIC METABOLIC PANEL
Anion gap: 5 (ref 5–15)
BUN: 9 mg/dL (ref 6–20)
CALCIUM: 7.8 mg/dL — AB (ref 8.9–10.3)
CO2: 27 mmol/L (ref 22–32)
CREATININE: 1.15 mg/dL (ref 0.61–1.24)
Chloride: 105 mmol/L (ref 101–111)
GFR calc non Af Amer: >60 mL/min (ref >60)
Glucose, Bld: 177 mg/dL — ABNORMAL HIGH (ref 65–99)
Potassium: 3.1 mmol/L — ABNORMAL LOW (ref 3.5–5.1)
SODIUM: 137 mmol/L (ref 135–145)

## 2016-07-15 LAB — BLOOD GAS, ARTERIAL
Acid-Base Excess: 1.6 mmol/L (ref 0.0–2.0)
BICARBONATE: 26 meq/L — AB (ref 20.0–24.0)
Drawn by: 312971
FIO2: 0.7
O2 Saturation: 98.1 %
PATIENT TEMPERATURE: 95.5
PCO2 ART: 40.5 mmHg (ref 35.0–45.0)
PEEP: 5 cmH2O
PO2 ART: 105 mmHg — AB (ref 80.0–100.0)
RATE: 14 resp/min
TCO2: 27.4 mmol/L (ref 0–100)
VT: 620 mL
pH, Arterial: 7.415 (ref 7.350–7.450)

## 2016-07-15 LAB — POCT I-STAT 7, (LYTES, BLD GAS, ICA,H+H)
ACID-BASE DEFICIT: 2 mmol/L (ref 0.0–2.0)
BICARBONATE: 26.3 meq/L — AB (ref 20.0–24.0)
CALCIUM ION: 1.21 mmol/L (ref 1.12–1.23)
HEMATOCRIT: 35 % — AB (ref 39.0–52.0)
Hemoglobin: 11.9 g/dL — ABNORMAL LOW (ref 13.0–17.0)
O2 SAT: 98 %
PH ART: 7.281 — AB (ref 7.350–7.450)
POTASSIUM: 3.4 mmol/L — AB (ref 3.5–5.1)
Patient temperature: 35.1
SODIUM: 141 mmol/L (ref 135–145)
TCO2: 28 mmol/L (ref 0–100)
pCO2 arterial: 54.7 mmHg — ABNORMAL HIGH (ref 35.0–45.0)
pO2, Arterial: 124 mmHg — ABNORMAL HIGH (ref 80.0–100.0)

## 2016-07-15 LAB — MAGNESIUM: MAGNESIUM: 1.3 mg/dL — AB (ref 1.7–2.4)

## 2016-07-15 LAB — PHOSPHORUS: PHOSPHORUS: 3.1 mg/dL (ref 2.5–4.6)

## 2016-07-15 LAB — TROPONIN I

## 2016-07-15 SURGERY — POSTERIOR LUMBAR FUSION 2 LEVEL
Anesthesia: General | Site: Back

## 2016-07-15 MED ORDER — DIAZEPAM 5 MG PO TABS: 5.0000 mg | ORAL_TABLET | Freq: Two times a day (BID) | ORAL | Status: DC | PRN

## 2016-07-15 MED ORDER — SODIUM CHLORIDE 0.9% FLUSH: 3.0000 mL | INTRAVENOUS | Status: DC | PRN

## 2016-07-15 MED ORDER — BACITRACIN 50000 UNITS IM SOLR
INTRAMUSCULAR | Status: DC | PRN
  Administered 2016-07-15 (×2)

## 2016-07-15 MED ORDER — CYCLOBENZAPRINE HCL 10 MG PO TABS
10.0000 mg | ORAL_TABLET | Freq: Three times a day (TID) | ORAL | Status: DC | PRN
  Administered 2016-07-16 – 2016-07-18 (×2): 10 mg via ORAL
  Filled 2016-07-15 (×4): qty 1

## 2016-07-15 MED ORDER — CEFAZOLIN SODIUM-DEXTROSE 2-4 GM/100ML-% IV SOLN
2.0000 g | INTRAVENOUS | Status: AC
  Administered 2016-07-15: 2 g via INTRAVENOUS
  Filled 2016-07-15: qty 100

## 2016-07-15 MED ORDER — VALSARTAN-HYDROCHLOROTHIAZIDE 320-12.5 MG PO TABS: 1.0000 | ORAL_TABLET | Freq: Every day | ORAL | Status: DC

## 2016-07-15 MED ORDER — VECURONIUM BROMIDE 10 MG IV SOLR
INTRAVENOUS | Status: DC | PRN
  Administered 2016-07-15: 3 mg via INTRAVENOUS
  Administered 2016-07-15 (×2): 2 mg via INTRAVENOUS
  Administered 2016-07-15: 8 mg via INTRAVENOUS

## 2016-07-15 MED ORDER — DEXAMETHASONE SODIUM PHOSPHATE 10 MG/ML IJ SOLN
10.0000 mg | INTRAMUSCULAR | Status: AC
  Administered 2016-07-15: 10 mg via INTRAVENOUS
  Filled 2016-07-15: qty 1

## 2016-07-15 MED ORDER — FENTANYL CITRATE (PF) 250 MCG/5ML IJ SOLN
INTRAMUSCULAR | Status: AC
  Filled 2016-07-15: qty 5

## 2016-07-15 MED ORDER — VANCOMYCIN HCL 1000 MG IV SOLR
INTRAVENOUS | Status: AC
  Filled 2016-07-15: qty 1000

## 2016-07-15 MED ORDER — ONDANSETRON HCL 4 MG/2ML IJ SOLN: 4.0000 mg | INTRAMUSCULAR | Status: DC | PRN

## 2016-07-15 MED ORDER — CHLORHEXIDINE GLUCONATE 0.12% ORAL RINSE (MEDLINE KIT)
15.0000 mL | Freq: Two times a day (BID) | OROMUCOSAL | Status: DC
  Administered 2016-07-15: 15 mL via OROMUCOSAL

## 2016-07-15 MED ORDER — PROPOFOL 1000 MG/100ML IV EMUL
INTRAVENOUS | Status: AC
  Administered 2016-07-15: 75 ug/kg/min via INTRAVENOUS
  Administered 2016-07-15: 100 ug/kg/min via INTRAVENOUS
  Filled 2016-07-15: qty 200

## 2016-07-15 MED ORDER — PHENYLEPHRINE HCL 10 MG/ML IJ SOLN
INTRAMUSCULAR | Status: DC | PRN
Start: 2016-07-15 — End: 2016-07-15
  Administered 2016-07-15: 80 ug via INTRAVENOUS
  Administered 2016-07-15 (×3): 40 ug via INTRAVENOUS

## 2016-07-15 MED ORDER — FENTANYL CITRATE (PF) 100 MCG/2ML IJ SOLN: 50.0000 ug | INTRAMUSCULAR | Status: DC | PRN

## 2016-07-15 MED ORDER — VECURONIUM BROMIDE 10 MG IV SOLR
INTRAVENOUS | Status: AC
  Filled 2016-07-15: qty 20

## 2016-07-15 MED ORDER — POTASSIUM CHLORIDE 10 MEQ/100ML IV SOLN
10.0000 meq | INTRAVENOUS | Status: AC
  Administered 2016-07-15 (×3): 10 meq via INTRAVENOUS
  Filled 2016-07-15 (×3): qty 100

## 2016-07-15 MED ORDER — ACETAMINOPHEN 650 MG RE SUPP: 650.0000 mg | RECTAL | Status: DC | PRN

## 2016-07-15 MED ORDER — OXYCODONE HCL 5 MG PO TABS: 5.0000 mg | ORAL_TABLET | Freq: Four times a day (QID) | ORAL | Status: DC | PRN

## 2016-07-15 MED ORDER — BUPIVACAINE LIPOSOME 1.3 % IJ SUSP
20.0000 mL | Freq: Once | INTRAMUSCULAR | Status: DC
  Filled 2016-07-15: qty 20

## 2016-07-15 MED ORDER — FENTANYL CITRATE (PF) 100 MCG/2ML IJ SOLN
50.0000 ug | INTRAMUSCULAR | Status: DC | PRN
  Administered 2016-07-15: 50 ug via INTRAVENOUS
  Filled 2016-07-15: qty 2

## 2016-07-15 MED ORDER — PROPOFOL 1000 MG/100ML IV EMUL
5.0000 ug/kg/min | INTRAVENOUS | Status: DC
  Administered 2016-07-15: 20 ug/kg/min via INTRAVENOUS
  Administered 2016-07-16: 40 ug/kg/min via INTRAVENOUS
  Administered 2016-07-16: 39.37 ug/kg/min via INTRAVENOUS
  Administered 2016-07-16: 40 ug/kg/min via INTRAVENOUS
  Filled 2016-07-15 (×4): qty 100

## 2016-07-15 MED ORDER — DIPHENHYDRAMINE HCL 50 MG/ML IJ SOLN
INTRAMUSCULAR | Status: DC | PRN
  Administered 2016-07-15: 50 mg via INTRAVENOUS

## 2016-07-15 MED ORDER — VECURONIUM BROMIDE 10 MG IV SOLR
INTRAVENOUS | Status: AC
  Filled 2016-07-15: qty 10

## 2016-07-15 MED ORDER — VASOPRESSIN 20 UNIT/ML IV SOLN
INTRAVENOUS | Status: DC | PRN
  Administered 2016-07-15 (×2): 2 [IU] via INTRAVENOUS
  Administered 2016-07-15: 3 [IU] via INTRAVENOUS

## 2016-07-15 MED ORDER — HYDROCORTISONE NA SUCCINATE PF 250 MG IJ SOLR
INTRAMUSCULAR | Status: AC
  Filled 2016-07-15: qty 250

## 2016-07-15 MED ORDER — ANTISEPTIC ORAL RINSE SOLUTION (CORINZ)
7.0000 mL | OROMUCOSAL | Status: DC
Start: 2016-07-15 — End: 2016-07-17
  Administered 2016-07-15 – 2016-07-16 (×5): 7 mL via OROMUCOSAL

## 2016-07-15 MED ORDER — LACTATED RINGERS IV SOLN
INTRAVENOUS | Status: DC
  Administered 2016-07-15 – 2016-07-16 (×6): via INTRAVENOUS
  Administered 2016-07-17: 75 mL/h via INTRAVENOUS

## 2016-07-15 MED ORDER — LACTATED RINGERS IV SOLN
INTRAVENOUS | Status: DC | PRN
  Administered 2016-07-15: 12:00:00 via INTRAVENOUS

## 2016-07-15 MED ORDER — IOPAMIDOL (ISOVUE-370) INJECTION 76%
INTRAVENOUS | Status: AC
  Administered 2016-07-15: 100 mL
  Filled 2016-07-15: qty 100

## 2016-07-15 MED ORDER — EPHEDRINE 5 MG/ML INJ
INTRAVENOUS | Status: AC
  Filled 2016-07-15: qty 10

## 2016-07-15 MED ORDER — HYDROMORPHONE HCL 1 MG/ML IJ SOLN: 0.5000 mg | INTRAMUSCULAR | Status: DC | PRN

## 2016-07-15 MED ORDER — SODIUM CHLORIDE 0.9% FLUSH
3.0000 mL | Freq: Two times a day (BID) | INTRAVENOUS | Status: DC
  Administered 2016-07-15 – 2016-07-17 (×5): 3 mL via INTRAVENOUS

## 2016-07-15 MED ORDER — SUCCINYLCHOLINE CHLORIDE 20 MG/ML IJ SOLN
INTRAMUSCULAR | Status: DC | PRN
  Administered 2016-07-15: 100 mg via INTRAVENOUS

## 2016-07-15 MED ORDER — CHLORHEXIDINE GLUCONATE 0.12 % MT SOLN
15.0000 mL | Freq: Two times a day (BID) | OROMUCOSAL | Status: DC
  Administered 2016-07-15 (×2): 15 mL via OROMUCOSAL
  Filled 2016-07-15: qty 15

## 2016-07-15 MED ORDER — FAMOTIDINE IN NACL 20-0.9 MG/50ML-% IV SOLN
20.0000 mg | INTRAVENOUS | Status: DC
  Administered 2016-07-15 – 2016-07-17 (×3): 20 mg via INTRAVENOUS
  Filled 2016-07-15 (×3): qty 50

## 2016-07-15 MED ORDER — 0.9 % SODIUM CHLORIDE (POUR BTL) OPTIME
TOPICAL | Status: DC | PRN
  Administered 2016-07-15: 1000 mL

## 2016-07-15 MED ORDER — CYCLOBENZAPRINE HCL 10 MG PO TABS: 10.0000 mg | ORAL_TABLET | Freq: Three times a day (TID) | ORAL | Status: DC | PRN

## 2016-07-15 MED ORDER — VASOPRESSIN 20 UNIT/ML IV SOLN
INTRAVENOUS | Status: AC
  Filled 2016-07-15: qty 1

## 2016-07-15 MED ORDER — EPHEDRINE SULFATE 50 MG/ML IJ SOLN
INTRAMUSCULAR | Status: DC | PRN
  Administered 2016-07-15 (×2): 5 mg via INTRAVENOUS

## 2016-07-15 MED ORDER — FENTANYL CITRATE (PF) 100 MCG/2ML IJ SOLN
INTRAMUSCULAR | Status: DC | PRN
  Administered 2016-07-15: 100 ug via INTRAVENOUS
  Administered 2016-07-15 (×4): 50 ug via INTRAVENOUS

## 2016-07-15 MED ORDER — LIDOCAINE HCL (CARDIAC) 20 MG/ML IV SOLN
INTRAVENOUS | Status: DC | PRN
  Administered 2016-07-15: 100 mg via INTRAVENOUS

## 2016-07-15 MED ORDER — PHENYLEPHRINE 40 MCG/ML (10ML) SYRINGE FOR IV PUSH (FOR BLOOD PRESSURE SUPPORT)
PREFILLED_SYRINGE | INTRAVENOUS | Status: AC
  Filled 2016-07-15: qty 10

## 2016-07-15 MED ORDER — CEFAZOLIN IN D5W 1 GM/50ML IV SOLN
1.0000 g | Freq: Three times a day (TID) | INTRAVENOUS | Status: AC
  Administered 2016-07-15 – 2016-07-16 (×2): 1 g via INTRAVENOUS
  Filled 2016-07-15 (×2): qty 50

## 2016-07-15 MED ORDER — MIDAZOLAM HCL 5 MG/5ML IJ SOLN
INTRAMUSCULAR | Status: DC | PRN
  Administered 2016-07-15 (×2): 2 mg via INTRAVENOUS

## 2016-07-15 MED ORDER — OXYCODONE HCL 5 MG PO TABS
5.0000 mg | ORAL_TABLET | Freq: Once | ORAL | Status: AC | PRN
  Administered 2016-07-17: 5 mg via ORAL
  Filled 2016-07-15: qty 1

## 2016-07-15 MED ORDER — LIDOCAINE-EPINEPHRINE 1 %-1:100000 IJ SOLN
INTRAMUSCULAR | Status: DC | PRN
  Administered 2016-07-15: 10 mL

## 2016-07-15 MED ORDER — SODIUM CHLORIDE 0.9 % IV SOLN: 250.0000 mL | INTRAVENOUS | Status: DC

## 2016-07-15 MED ORDER — HYDROCHLOROTHIAZIDE 12.5 MG PO CAPS: 12.5000 mg | ORAL_CAPSULE | Freq: Every day | ORAL | Status: DC

## 2016-07-15 MED ORDER — PROPOFOL 10 MG/ML IV BOLUS
INTRAVENOUS | Status: AC
  Filled 2016-07-15: qty 20

## 2016-07-15 MED ORDER — ESCITALOPRAM OXALATE 10 MG PO TABS
20.0000 mg | ORAL_TABLET | Freq: Every day | ORAL | Status: DC
  Administered 2016-07-16 – 2016-07-20 (×4): 20 mg via ORAL
  Filled 2016-07-15 (×5): qty 2

## 2016-07-15 MED ORDER — THROMBIN 20000 UNITS EX SOLR
CUTANEOUS | Status: DC | PRN
  Administered 2016-07-15 (×2): via TOPICAL

## 2016-07-15 MED ORDER — OXYCODONE HCL 5 MG/5ML PO SOLN: 5.0000 mg | Freq: Once | ORAL | Status: AC | PRN

## 2016-07-15 MED ORDER — IBUPROFEN 200 MG PO TABS: 200.0000 mg | ORAL_TABLET | Freq: Four times a day (QID) | ORAL | Status: DC | PRN

## 2016-07-15 MED ORDER — ACETAMINOPHEN 325 MG PO TABS
650.0000 mg | ORAL_TABLET | ORAL | Status: DC | PRN
  Administered 2016-07-17: 650 mg via ORAL
  Filled 2016-07-15: qty 2

## 2016-07-15 MED ORDER — PROMETHAZINE HCL 25 MG/ML IJ SOLN: 6.2500 mg | INTRAMUSCULAR | Status: DC | PRN

## 2016-07-15 MED ORDER — FLUTICASONE PROPIONATE 50 MCG/ACT NA SUSP
1.0000 | Freq: Every day | NASAL | Status: DC
  Administered 2016-07-17 – 2016-07-20 (×3): 1 via NASAL
  Filled 2016-07-15: qty 16

## 2016-07-15 MED ORDER — ROCURONIUM BROMIDE 50 MG/5ML IV SOLN
INTRAVENOUS | Status: AC
  Filled 2016-07-15: qty 1

## 2016-07-15 MED ORDER — HYDROMORPHONE HCL 1 MG/ML IJ SOLN: 0.2500 mg | INTRAMUSCULAR | Status: DC | PRN

## 2016-07-15 MED ORDER — PHENOL 1.4 % MT LIQD
1.0000 | OROMUCOSAL | Status: DC | PRN
Start: 2016-07-15 — End: 2016-07-20

## 2016-07-15 MED ORDER — DIAZEPAM 5 MG/ML IJ SOLN
5.0000 mg | Freq: Four times a day (QID) | INTRAMUSCULAR | Status: DC | PRN
  Administered 2016-07-16: 5 mg via INTRAVENOUS
  Filled 2016-07-15: qty 2

## 2016-07-15 MED ORDER — MIDAZOLAM HCL 2 MG/2ML IJ SOLN: 1.0000 mg | INTRAMUSCULAR | Status: DC | PRN

## 2016-07-15 MED ORDER — MIDAZOLAM HCL 2 MG/2ML IJ SOLN
INTRAMUSCULAR | Status: AC
  Filled 2016-07-15: qty 2

## 2016-07-15 MED ORDER — PROPOFOL 10 MG/ML IV BOLUS
INTRAVENOUS | Status: DC | PRN
  Administered 2016-07-15: 150 mg via INTRAVENOUS

## 2016-07-15 MED ORDER — MIDAZOLAM HCL 2 MG/2ML IJ SOLN
1.0000 mg | INTRAMUSCULAR | Status: DC | PRN
  Administered 2016-07-15: 4 mg via INTRAVENOUS
  Administered 2016-07-16 (×3): 1 mg via INTRAVENOUS
  Administered 2016-07-16: 2 mg via INTRAVENOUS
  Filled 2016-07-15 (×2): qty 2
  Filled 2016-07-15: qty 4

## 2016-07-15 MED ORDER — VANCOMYCIN HCL 1000 MG IV SOLR: INTRAVENOUS | Status: DC | PRN

## 2016-07-15 MED ORDER — LIDOCAINE 2% (20 MG/ML) 5 ML SYRINGE
INTRAMUSCULAR | Status: AC
  Filled 2016-07-15: qty 5

## 2016-07-15 MED ORDER — ADULT MULTIVITAMIN W/MINERALS CH
1.0000 | ORAL_TABLET | Freq: Every day | ORAL | Status: DC
  Administered 2016-07-16 – 2016-07-20 (×4): 1 via ORAL
  Filled 2016-07-15 (×5): qty 1

## 2016-07-15 MED ORDER — PROPOFOL 500 MG/50ML IV EMUL
INTRAVENOUS | Status: AC
  Filled 2016-07-15: qty 100

## 2016-07-15 MED ORDER — OXYCODONE-ACETAMINOPHEN 5-325 MG PO TABS: 1.0000 | ORAL_TABLET | ORAL | Status: DC | PRN

## 2016-07-15 MED ORDER — CHLORHEXIDINE GLUCONATE 0.12% ORAL RINSE (MEDLINE KIT): 15.0000 mL | Freq: Two times a day (BID) | OROMUCOSAL | Status: DC

## 2016-07-15 MED ORDER — DEXAMETHASONE SODIUM PHOSPHATE 4 MG/ML IJ SOLN
4.0000 mg | Freq: Four times a day (QID) | INTRAMUSCULAR | Status: DC
  Administered 2016-07-15 – 2016-07-19 (×10): 4 mg via INTRAVENOUS
  Filled 2016-07-15 (×10): qty 1

## 2016-07-15 MED ORDER — FENTANYL CITRATE (PF) 100 MCG/2ML IJ SOLN
25.0000 ug | INTRAMUSCULAR | Status: DC | PRN
  Administered 2016-07-15 – 2016-07-16 (×5): 100 ug via INTRAVENOUS
  Filled 2016-07-15 (×5): qty 2

## 2016-07-15 MED ORDER — DIPHENHYDRAMINE HCL 50 MG/ML IJ SOLN
INTRAMUSCULAR | Status: AC
  Filled 2016-07-15: qty 1

## 2016-07-15 MED ORDER — ONDANSETRON HCL 4 MG/2ML IJ SOLN
INTRAMUSCULAR | Status: AC
  Filled 2016-07-15: qty 2

## 2016-07-15 MED ORDER — PHENYLEPHRINE HCL 10 MG/ML IJ SOLN
10.0000 mg | INTRAVENOUS | Status: DC | PRN
  Administered 2016-07-15: 50 ug/min via INTRAVENOUS

## 2016-07-15 MED ORDER — VALSARTAN 160 MG PO TABS
320.0000 mg | ORAL_TABLET | Freq: Every day | ORAL | Status: DC
  Filled 2016-07-15: qty 2

## 2016-07-15 MED ORDER — DEXAMETHASONE 4 MG PO TABS
4.0000 mg | ORAL_TABLET | Freq: Four times a day (QID) | ORAL | Status: DC
  Administered 2016-07-16 – 2016-07-20 (×8): 4 mg via ORAL
  Filled 2016-07-15 (×8): qty 1

## 2016-07-15 MED ORDER — ANTISEPTIC ORAL RINSE SOLUTION (CORINZ)
7.0000 mL | Freq: Four times a day (QID) | OROMUCOSAL | Status: DC
  Administered 2016-07-15: 7 mL via OROMUCOSAL

## 2016-07-15 MED ORDER — MIDAZOLAM HCL 2 MG/2ML IJ SOLN
1.0000 mg | INTRAMUSCULAR | Status: DC | PRN
  Filled 2016-07-15: qty 2

## 2016-07-15 MED ORDER — ALBUMIN HUMAN 5 % IV SOLN
INTRAVENOUS | Status: DC | PRN
  Administered 2016-07-15: 11:00:00 via INTRAVENOUS

## 2016-07-15 MED ORDER — MENTHOL 3 MG MT LOZG: 1.0000 | LOZENGE | OROMUCOSAL | Status: DC | PRN

## 2016-07-15 MED ORDER — EPINEPHRINE HCL 0.1 MG/ML IJ SOSY
PREFILLED_SYRINGE | INTRAMUSCULAR | Status: DC | PRN
  Administered 2016-07-15: 10 ug via INTRAVENOUS

## 2016-07-15 MED ORDER — ACETAMINOPHEN 10 MG/ML IV SOLN
INTRAVENOUS | Status: AC
  Filled 2016-07-15: qty 100

## 2016-07-15 MED ORDER — HYDROCORTISONE NA SUCCINATE PF 100 MG IJ SOLR
INTRAMUSCULAR | Status: DC | PRN
  Administered 2016-07-15: 100 mg via INTRAVENOUS

## 2016-07-15 MED ORDER — BUPIVACAINE LIPOSOME 1.3 % IJ SUSP: INTRAMUSCULAR | Status: DC | PRN

## 2016-07-15 SURGICAL SUPPLY — 70 items
BAG DECANTER FOR FLEXI CONT (MISCELLANEOUS) ×3 IMPLANT
BENZOIN TINCTURE PRP APPL 2/3 (GAUZE/BANDAGES/DRESSINGS) ×3 IMPLANT
BLADE CLIPPER SURG (BLADE) IMPLANT
BLADE SURG 11 STRL SS (BLADE) ×3 IMPLANT
BRUSH SCRUB EZ PLAIN DRY (MISCELLANEOUS) ×3 IMPLANT
BUR MATCHSTICK NEURO 3.0 LAGG (BURR) ×3 IMPLANT
BUR PRECISION FLUTE 6.0 (BURR) ×3 IMPLANT
CAGE RISE 11-17-15 10X26 (Cage) ×6 IMPLANT
CANISTER SUCT 3000ML PPV (MISCELLANEOUS) ×3 IMPLANT
CLOSURE WOUND 1/2 X4 (GAUZE/BANDAGES/DRESSINGS) ×2
CONT SPEC 4OZ CLIKSEAL STRL BL (MISCELLANEOUS) ×3 IMPLANT
COVER BACK TABLE 60X90IN (DRAPES) ×3 IMPLANT
DECANTER SPIKE VIAL GLASS SM (MISCELLANEOUS) ×3 IMPLANT
DRAPE C-ARM 42X72 X-RAY (DRAPES) ×6 IMPLANT
DRAPE C-ARMOR (DRAPES) IMPLANT
DRAPE LAPAROTOMY 100X72X124 (DRAPES) ×3 IMPLANT
DRAPE POUCH INSTRU U-SHP 10X18 (DRAPES) ×3 IMPLANT
DRAPE PROXIMA HALF (DRAPES) IMPLANT
DRAPE SURG 17X23 STRL (DRAPES) ×3 IMPLANT
DRSG OPSITE POSTOP 4X10 (GAUZE/BANDAGES/DRESSINGS) ×3 IMPLANT
DURAPREP 26ML APPLICATOR (WOUND CARE) ×3 IMPLANT
ELECT BLADE 4.0 EZ CLEAN MEGAD (MISCELLANEOUS) ×3
ELECT REM PT RETURN 9FT ADLT (ELECTROSURGICAL) ×3
ELECTRODE BLDE 4.0 EZ CLN MEGD (MISCELLANEOUS) ×1 IMPLANT
ELECTRODE REM PT RTRN 9FT ADLT (ELECTROSURGICAL) ×1 IMPLANT
EVACUATOR 3/16  PVC DRAIN (DRAIN)
EVACUATOR 3/16 PVC DRAIN (DRAIN) IMPLANT
GAUZE SPONGE 4X4 12PLY STRL (GAUZE/BANDAGES/DRESSINGS) IMPLANT
GAUZE SPONGE 4X4 16PLY XRAY LF (GAUZE/BANDAGES/DRESSINGS) ×3 IMPLANT
GLOVE BIO SURGEON STRL SZ8 (GLOVE) ×9 IMPLANT
GLOVE BIO SURGEON STRL SZ8.5 (GLOVE) ×3 IMPLANT
GLOVE BIOGEL PI IND STRL 6.5 (GLOVE) ×3 IMPLANT
GLOVE BIOGEL PI IND STRL 8 (GLOVE) ×4 IMPLANT
GLOVE BIOGEL PI INDICATOR 6.5 (GLOVE) ×6
GLOVE BIOGEL PI INDICATOR 8 (GLOVE) ×8
GLOVE ECLIPSE 7.5 STRL STRAW (GLOVE) ×12 IMPLANT
GLOVE EXAM NITRILE LRG STRL (GLOVE) IMPLANT
GLOVE EXAM NITRILE MD LF STRL (GLOVE) IMPLANT
GLOVE EXAM NITRILE XL STR (GLOVE) IMPLANT
GLOVE EXAM NITRILE XS STR PU (GLOVE) IMPLANT
GLOVE INDICATOR 8.5 STRL (GLOVE) ×6 IMPLANT
GLOVE SURG SS PI 6.5 STRL IVOR (GLOVE) ×12 IMPLANT
GOWN STRL REUS W/ TWL LRG LVL3 (GOWN DISPOSABLE) ×2 IMPLANT
GOWN STRL REUS W/ TWL XL LVL3 (GOWN DISPOSABLE) ×3 IMPLANT
GOWN STRL REUS W/TWL 2XL LVL3 (GOWN DISPOSABLE) ×9 IMPLANT
GOWN STRL REUS W/TWL LRG LVL3 (GOWN DISPOSABLE) ×4
GOWN STRL REUS W/TWL XL LVL3 (GOWN DISPOSABLE) ×6
KIT BASIN OR (CUSTOM PROCEDURE TRAY) ×3 IMPLANT
KIT INFUSE X SMALL 1.4CC (Orthopedic Implant) IMPLANT
KIT ROOM TURNOVER OR (KITS) ×3 IMPLANT
LIQUID BAND (GAUZE/BANDAGES/DRESSINGS) ×3 IMPLANT
NEEDLE HYPO 21X1.5 SAFETY (NEEDLE) ×3 IMPLANT
NEEDLE HYPO 25X1 1.5 SAFETY (NEEDLE) ×3 IMPLANT
NS IRRIG 1000ML POUR BTL (IV SOLUTION) ×3 IMPLANT
PACK LAMINECTOMY NEURO (CUSTOM PROCEDURE TRAY) ×3 IMPLANT
PAD ARMBOARD 7.5X6 YLW CONV (MISCELLANEOUS) ×9 IMPLANT
PATTIES SURGICAL 1X1 (DISPOSABLE) ×3 IMPLANT
PUTTY BONE DBX 5CC MIX (Putty) ×3 IMPLANT
SPONGE LAP 4X18 X RAY DECT (DISPOSABLE) IMPLANT
SPONGE SURGIFOAM ABS GEL 100 (HEMOSTASIS) ×6 IMPLANT
STRIP CLOSURE SKIN 1/2X4 (GAUZE/BANDAGES/DRESSINGS) ×4 IMPLANT
SUT VIC AB 0 CT1 18XCR BRD8 (SUTURE) ×2 IMPLANT
SUT VIC AB 0 CT1 8-18 (SUTURE) ×4
SUT VIC AB 2-0 CT1 18 (SUTURE) ×6 IMPLANT
SUT VIC AB 4-0 PS2 27 (SUTURE) ×3 IMPLANT
SYR 20CC LL (SYRINGE) ×3 IMPLANT
TOWEL OR 17X24 6PK STRL BLUE (TOWEL DISPOSABLE) ×3 IMPLANT
TOWEL OR 17X26 10 PK STRL BLUE (TOWEL DISPOSABLE) ×3 IMPLANT
TRAY FOLEY W/METER SILVER 16FR (SET/KITS/TRAYS/PACK) ×3 IMPLANT
WATER STERILE IRR 1000ML POUR (IV SOLUTION) ×3 IMPLANT

## 2016-07-15 NOTE — Transfer of Care (Signed)
Immediate Anesthesia Transfer of Care Note  Patient: Zachary [name]  Procedure(s) Performed: Procedure(s): PLIF - L4-L5 - L3-L4 (N/A)  Patient Location: SICU  Anesthesia Type:General  Level of Consciousness: sedated  Airway & Oxygen Therapy: Patient remains intubated per anesthesia plan  Post-op Assessment: Report given to RN  Post vital signs: stable  Last Vitals:  Filed Vitals:   07/15/16 0747  BP: 163/86  Pulse: 63  Temp: 36.7 C  Resp: 20    Last Pain: There were no vitals filed for this visit.       Complications: cardiovascular complications

## 2016-07-15 NOTE — OR Nursing (Signed)
Surgery ended due to change in patient status. Patient transported to CT emergently then to [facility] by CRNA and OR staff.

## 2016-07-15 NOTE — Progress Notes (Signed)
eLink Physician-Brief Progress Note Patient Name: Zachary [name] DOB: 1946-12-09 MRN: [id number]   Date of Service  07/15/2016  HPI/Events of Note  Patient is agitated. Fentanyl IV PRN and Versed IV PRN not controlling the patient's agitation. BP = 173/74 by A-line.  eICU Interventions  Will order: 1. Propofol IV infusion. Titrate to RASS 0 to -1.     Intervention Category Minor Interventions: Agitation / anxiety - evaluation and management  Lysle Dingwall 07/15/2016, 5:44 PM

## 2016-07-15 NOTE — Anesthesia Postprocedure Evaluation (Signed)
Anesthesia Post Note  Patient: Zachary [name]  Procedure(s) Performed: Procedure(s) (LRB): PLIF - L4-L5 - L3-L4 (N/A)  Patient location during evaluation: SICU Anesthesia Type: General Level of consciousness: sedated Pain management: pain level controlled Vital Signs Assessment: post-procedure vital signs reviewed and stable Respiratory status: patient remains intubated per anesthesia plan Cardiovascular status: blood pressure returned to baseline Postop Assessment: no signs of nausea or vomiting Anesthetic complications: no Comments: Patient had intraop event (see anesthesia record) of immediate and profound hypotension with tachycardia, no EKG signs of ischemia, that resolved in 15 minutes with return to supine position, epinephrine, hydrocortisone, benadryl, vasopressin and volume loading.. Likely diagnosis at this time with negative CT abdomen and chest is pulmonary air embolism that resolved with volume, inotropy support and position change,, also possible anaphylactic/anaphylactoid response to possibly cell saver.. Cardiac enzymes pending and post op EKG pending.. ICU critical care is following now and will continue work up.. Zachary [name] can likely be extubated tonight he is doing so well, off phenylephrine now, SR at 70, with minimal need for vent support    Last Vitals:  Filed Vitals:   07/15/16 0747  BP: 163/86  Pulse: 63  Temp: 36.7 C  Resp: 20    Last Pain: There were no vitals filed for this visit.               Zenaida Deed

## 2016-07-15 NOTE — Op Note (Signed)
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  Surgeon: Dominica Severin Devion Chriscoe  Anesthesia: Gen.  EBL: Less than 500  History of present illness: Patient is a very pleasant 70 year old woman whose had long-standing history with his back with back pain and bilateral leg pain he's had previous laminotomies and done very well however last several months a separate rest worsening back and bilateral leg pain and claudication. Workup has revealed severe stenosis at L3-4 and recurrent disc at L4-5 with marked foraminal stenosis at both levels. Due to patient's failure conservative treatment imaging findings and progressive clinical syndrome I recommended decompression stabilization procedure at L3-4 and L4-5. I extensively reviewed the risks and benefits of the operation with the patient as well as perioperative course expectations of outcome and alternatives of surgery and he understands and agrees to proceed forward.  Operative procedure: Patient brought into the or was induced under general anesthesia positioned prone on the [facility] table his back was prepped and draped in routine sterile fashion is old incision was opened up and extended slightly cephalad scar tissues dissected free and subperiosteal dissections care lamina of L3-L4 and L5 bilaterally exposing TPs L3 and L4-L5 bilaterally. After interoperative X identify the appropriate level the spinous processes at L4 and L5 removed central decompression was begun. First working on the patient's right side complete medial facetectomies were performed a radical foraminotomies decompressing the thecal sac which there was severe  hourglass compression of thecal sac. Omelette L3-4. Then working on the patient's left side freed up the scar tissue at L4-5 and performed a similar complete medial facetectomies radical from an obvious decompressing the L3-L4 and L5 nerve roots bilaterally and the central canal. At this point cleaned out the disc spaces bilaterally using sequential distraction at L3-4 I placed ultimately after adequate discectomy and endplate preparation attends 17 expander cage open his approximate 5 turns then removed the distraction then head L4-5 again with sequential distraction I placed a right-sided tenderness 17 cage in a similar fashion without any distractors in place and after expansion of the cage the the patient acutely dropped his blood pressure to about 50-60. It initially did not respond to initial fluid resuscitation and initiation of pressors so after extensive discussions with the anesthesiologist are working diagnosis was either acute blood loss however there was no bleeding in the operative field it was very dry never saw any blood coming out of the disc space on the contralateral side which had already cleaned out and the implant was in good position it was appropriately positioned in the posterior aspect of the disc space. However due to the unknown and the rapidity of the drop in blood pressure we did decided to abort the case I used 0 Vicryl to close the skin after placing a medium Hemovac drain we then dressed the wound and flipped the patient over on his back. All initial blood work showed a relatively normal ABG as well as electrolytes and hematocrit stable with a hematocrit of 35 and a hemoglobin of 11.9. We did not feel that this was consistent with a large bleeding issue retroperitoneally. However I did discuss this with vascular surgery we agreed with awaiting for the final lab work emergent CT angiogram of the chest abdomen pelvis to rule out  PE as well as intra-abdominal injury I extensively talked  to the family and aborted the back operation for today what we workup and stabilized patient. Upon closure and removal of retraction all needle counts and sponge counts were correct.

## 2016-07-15 NOTE — Anesthesia Procedure Notes (Signed)
Procedure Name: Intubation Date/Time: 07/15/2016 8:44 AM Performed by: Jenne Campus Pre-anesthesia Checklist: Patient identified, Emergency Drugs available, Suction available and Patient being monitored Patient Re-evaluated:Patient Re-evaluated prior to inductionOxygen Delivery Method: Circle System Utilized Preoxygenation: Pre-oxygenation with 100% oxygen Intubation Type: IV induction Ventilation: Mask ventilation without difficulty Grade View: Grade I Tube type: Oral Tube size: 7.5 mm Number of attempts: 1 Airway Equipment and Method: Oral airway,  Video-laryngoscopy and Rigid stylet Placement Confirmation: ETT inserted through vocal cords under direct vision,  positive ETCO2 and breath sounds checked- equal and bilateral Secured at: 24 cm Tube secured with: Tape Dental Injury: Teeth and Oropharynx as per pre-operative assessment  Difficulty Due To: Difficulty was anticipated, Difficult Airway- due to reduced neck mobility and Difficult Airway- due to anterior larynx Future Recommendations: Recommend- induction with short-acting agent, and alternative techniques readily available Comments: Elective video-glidescope.

## 2016-07-15 NOTE — Progress Notes (Signed)
  Echocardiogram 2D Echocardiogram has been performed.  Bobbye Charleston 07/15/2016, 3:50 PM

## 2016-07-15 NOTE — H&P (Signed)
Zachary [name] is an 70 y.o. male.   Chief Complaint: Back and leg pain HPI: Patient is very 89 years and was a previous laminotomies whose developed recurrent back and leg pain refractory to all forms of conservative treatment. Pain is bilateral worse on the right. Workup has revealed progressive deterioration severe lumbar spinal stenosis and instability at L3-4 and L4-5. Due to patient's failed conservative treatment imaging findings and progression of clinical syndrome I recommended laminectomy discectomy and posterior lumbar interbody fusion from L3-L5. I've extensively reviewed the risks and benefits of the operation with the patient as well as perioperative course expectations of outcome and alternatives of surgery and he understands and agrees to proceed forward.  Past Medical History  Diagnosis Date  . Chronic back pain     spondylolisthesis  . Cluster headaches   . PONV (postoperative nausea and vomiting)     Hx: of "sometimes"  . Arthritis   . Obstructive sleep apnea on CPAP     uses CPAP @ night  . Anxiety     takes Valium as needed  . Bladder cancer ([place])     takes Rapaflo daily  . Essential hypertension, benign     takes Diovan-HCT daily  . Pneumonia     "several times"--last time about 3-4 yrs ago  . Joint pain   . Depression     takes Lexapro daily  . Prediabetes   . Blood dyscrasia     07/08/16: pt told he was a "free bleeder" after bleeding a lot when derm cut off mole on forehead. No excessive bleeding with minor wounds at home, no bleeding problems perioperatively with prior surgeries.    Past Surgical History  Procedure Laterality Date  . Arthroscopic knee surgery    . Carpal tunnel release    . Cystoscopy    . Back surgery       x 2  . Bladder surgery      x 5 to remove tumor  . Cataract extraction w/phaco  12/19/2012    Procedure: CATARACT EXTRACTION PHACO AND INTRAOCULAR LENS PLACEMENT (IOC);  Surgeon: [name], MD;  Location: [facility];  Service:  Ophthalmology;  Laterality: Left;  CDE: 26.80  . Cataract extraction w/phaco  01/09/2013    Procedure: CATARACT EXTRACTION PHACO AND INTRAOCULAR LENS PLACEMENT (IOC);  Surgeon: [name], MD;  Location: [facility];  Service: Ophthalmology;  Laterality: Right;  CDE:22.83  . Refractive surgery      Hx: of  . Cervical fusion  06/05/2013    C 3  C4  . Anterior cervical decomp/discectomy fusion N/A 06/05/2013    Procedure:  C3-4 Anterior Cervical Discectomy and Fusion, Allograft, Plate;  Surgeon: [name], MD;  Location: [facility];  Service: Orthopedics;  Laterality: N/A;  C3-4 Anterior Cervical Discectomy and Fusion, Allograft, Plate  . Retinal detachment surgery    . Transurethral resection of bladder tumor Right 07/27/2015    Procedure: TRANSURETHRAL RESECTION OF BLADDER TUMOR (TURBT);  Surgeon: [name], MD;  Location: [facility];  Service: Urology;  Laterality: Right;  . Eye surgery      Family History  Problem Relation Age of Onset  . Hypertension Father   . Cancer Father     Prostate  . Depression Mother   . Hypertension Mother   . Alcohol abuse Mother   . Hypertension Sister   . Anxiety disorder Sister   . Alcohol abuse Maternal Grandfather   . ADD / ADHD Neg Hx   .  Bipolar disorder Neg Hx   . Dementia Neg Hx   . Drug abuse Neg Hx   . OCD Neg Hx   . Paranoid behavior Neg Hx   . Schizophrenia Neg Hx   . Seizures Neg Hx   . Sexual abuse Neg Hx   . Physical abuse Neg Hx    Social History:  reports that he has quit smoking. He has never used smokeless tobacco. He reports that he does not drink alcohol or use illicit drugs.  Allergies:  Allergies  Allergen Reactions  . Sulfonamide Derivatives Itching, Rash and Other (See Comments)    whelps    Medications Prior to Admission  Medication Sig Dispense Refill  . acetaminophen (TYLENOL) 500 MG tablet Take 1,000-1,500 mg by mouth every 6 (six) hours as needed for mild pain or headache. For pain    . cyclobenzaprine (FLEXERIL)  10 MG tablet Take 10 mg by mouth 3 (three) times daily as needed for muscle spasms.   2  . diazepam (VALIUM) 5 MG tablet Take 1 tablet (5 mg total) by mouth 2 (two) times daily as needed for anxiety. 60 tablet 4  . escitalopram (LEXAPRO) 20 MG tablet Take 20 mg by mouth daily. Reported on 07/03/2016  3  . fluticasone (FLONASE) 50 MCG/ACT nasal spray Place 1 spray into both nostrils daily.    Marland Kitchen ibuprofen (ADVIL,MOTRIN) 200 MG tablet Take 200-400 mg by mouth every 6 (six) hours as needed for moderate pain.     . Multiple Vitamin (MULTIVITAMIN WITH MINERALS) TABS tablet Take 1 tablet by mouth daily.    . NON FORMULARY cpap    . NON FORMULARY Apply 1 application topically as needed (C). Compound Cream from MD office    . oxyCODONE (OXY IR/ROXICODONE) 5 MG immediate release tablet Take 5 mg by mouth every 6 (six) hours as needed for severe pain.    . silodosin (RAPAFLO) 8 MG CAPS capsule Take 8 mg by mouth daily with breakfast.    . valsartan-hydrochlorothiazide (DIOVAN-HCT) 320-12.5 MG per tablet Take 1 tablet by mouth daily.      Marland Kitchen escitalopram (LEXAPRO) 10 MG tablet Take 1 tablet (10 mg total) by mouth daily. (Patient not taking: Reported on 07/03/2016) 30 tablet 4    No results found for this or any previous visit (from the past 48 hour(s)). No results found.  Review of Systems  Constitutional: Negative.   HENT: Negative.   Eyes: Negative.   Respiratory: Negative.   Cardiovascular: Negative.   Gastrointestinal: Negative.   Genitourinary: Negative.   Musculoskeletal: Positive for myalgias, back pain and joint pain.  Neurological: Negative.   Endo/Heme/Allergies: Negative.   Psychiatric/Behavioral: Negative.     Blood pressure 163/86, pulse 63, temperature 98 F (36.7 C), temperature source Oral, resp. rate 20, weight 101.606 kg (224 lb), SpO2 99 %. Physical Exam  Constitutional: He is oriented to person, place, and time. He appears well-developed and well-nourished.  HENT:  Head:  Normocephalic.  Eyes: Pupils are equal, round, and reactive to light.  Neck: Normal range of motion.  Cardiovascular: Normal rate.   Respiratory: Effort normal.  GI: Soft. Bowel sounds are normal.  Musculoskeletal: Normal range of motion.  Neurological: He is alert and oriented to person, place, and time. He has normal strength. GCS eye subscore is 4. GCS verbal subscore is 5. GCS motor subscore is 6.  Strength is 5 out of 5 in his iliopsoas, quads, hip she's, gastric, into tibialis, and EHL.  Skin: Skin is  warm and dry.     Assessment/Plan 70 year old presents for an L3-L5 posterior lumbar interbody fusion.  Nyheim Seufert P, MD 07/15/2016, 8:10 AM

## 2016-07-15 NOTE — Anesthesia Preprocedure Evaluation (Addendum)
Anesthesia Evaluation  Patient identified by MRN, date of birth, ID band Patient awake    Reviewed: Allergy & Precautions, H&P , NPO status , Patient's Chart, lab work & pertinent test results  History of Anesthesia Complications (+) PONV and history of anesthetic complications  Airway Mallampati: III  TM Distance: <3 FB Neck ROM: Limited    Dental  (+) Teeth Intact, Dental Advisory Given,    Pulmonary shortness of breath, sleep apnea and Continuous Positive Airway Pressure Ventilation , former smoker,    breath sounds clear to auscultation       Cardiovascular hypertension,  Rhythm:Regular Rate:Normal     Neuro/Psych  Headaches, Anxiety Depression  Neuromuscular disease    GI/Hepatic   Endo/Other    Renal/GU Bladder ca hx     Musculoskeletal  (+) Arthritis ,   Abdominal (+) + obese,   Peds  Hematology   Anesthesia Other Findings   Reproductive/Obstetrics                            Anesthesia Physical  Anesthesia Plan  ASA: III  Anesthesia Plan: General   Post-op Pain Management:    Induction: Intravenous  Airway Management Planned: Oral ETT and Video Laryngoscope Planned  Additional Equipment:   Intra-op Plan:   Post-operative Plan: Extubation in OR  Informed Consent: I have reviewed the patients History and Physical, chart, labs and discussed the procedure including the risks, benefits and alternatives for the proposed anesthesia with the patient or authorized representative who has indicated his/her understanding and acceptance.     Plan Discussed with: CRNA and Surgeon  Anesthesia Plan Comments:         Anesthesia Quick Evaluation

## 2016-07-15 NOTE — Transfer of Care (Signed)
Immediate Anesthesia Transfer of Care Note  Patient: Zachary [name]  Procedure(s) Performed: Procedure(s): PLIF - L4-L5 - L3-L4 (N/A)  Patient Location: ICU  Anesthesia Type:General  Level of Consciousness: sedated and Patient remains intubated per anesthesia plan  Airway & Oxygen Therapy: Patient re-intubated, Patient placed on Ventilator (see vital sign flow sheet for setting) and report given to bedside RN  Post-op Assessment: Report given to RN and Post -op Vital signs reviewed and stable  Post vital signs: Reviewed  Last Vitals:  Filed Vitals:   07/15/16 0747  BP: 163/86  Pulse: 63  Temp: 36.7 C  Resp: 20    Last Pain: There were no vitals filed for this visit.       Complications: No apparent anesthesia complications

## 2016-07-15 NOTE — Consult Note (Signed)
PULMONARY / CRITICAL CARE MEDICINE   Name: Zachary [name] MRN: [id number] DOB: [birth year]    ADMISSION DATE:  07/15/2016   CONSULTATION DATE: 07/15/2016  REFERRING MD:  Dr Saintclair Halsted  CHIEF COMPLAINT: hypotension during surgery  HISTORY OF PRESENT ILLNESS:   70 yo male with PMH of HTN, pre-diabetes, OSA with CPAP, bladder cancer,BPH, post-op nausea and vomiting, s/p transurethral resection of bladder tumor 07/27/15, Anterior cervical discectomy 06/05/13 and cataract extraction in 01/09/13 and 12/19/12. PCCM was consulted for evaluation of hypotension during surgery. In brief patient was having scheduled decompressive lumbar laminectomy with complete medial facetectomies and radical foraminotomies at L3-4 and L4-5 and posterior lumbar interbody fusion L3-4 and L4-5. Anesthesia began at [phone] and was unremarkable until 1205 when his BP dropped suddenly to the [id number] systolic after cellsaver infusion. Neo-synephrine was given without much response. He was also given 2 liters crystalloid and 500 ml of Albumin, vasopressin, epinephrine, benadryl and hydrocortisone. H/H was acceptable at 11.9/35. CT chest, abd pelvis did not indicate any acute bleeding. A CTA was negative for PE. He was then transferred intubated to 45M for further evaluation.   PAST MEDICAL HISTORY :  Past Medical History  Diagnosis Date  . Chronic back pain     spondylolisthesis  . Cluster headaches   . PONV (postoperative nausea and vomiting)     Hx: of "sometimes"  . Arthritis   . Obstructive sleep apnea on CPAP     uses CPAP @ night  . Anxiety     takes Valium as needed  . Bladder cancer ([place])     takes Rapaflo daily  . Essential hypertension, benign     takes Diovan-HCT daily  . Pneumonia     "several times"--last time about 3-4 yrs ago  . Joint pain   . Depression     takes Lexapro daily  . Prediabetes   . Blood dyscrasia     07/08/16: pt told he was a "free bleeder" after bleeding a lot when derm cut off mole on forehead.  No excessive bleeding with minor wounds at home, no bleeding problems perioperatively with prior surgeries.    PAST SURGICAL HISTORY: Past Surgical History  Procedure Laterality Date  . Arthroscopic knee surgery    . Carpal tunnel release    . Cystoscopy    . Back surgery       x 2  . Bladder surgery      x 5 to remove tumor  . Cataract extraction w/phaco  12/19/2012    Procedure: CATARACT EXTRACTION PHACO AND INTRAOCULAR LENS PLACEMENT (IOC);  Surgeon: [name], MD;  Location: [facility];  Service: Ophthalmology;  Laterality: Left;  CDE: 26.80  . Cataract extraction w/phaco  01/09/2013    Procedure: CATARACT EXTRACTION PHACO AND INTRAOCULAR LENS PLACEMENT (IOC);  Surgeon: [name], MD;  Location: [facility];  Service: Ophthalmology;  Laterality: Right;  CDE:22.83  . Refractive surgery      Hx: of  . Cervical fusion  06/05/2013    C 3  C4  . Anterior cervical decomp/discectomy fusion N/A 06/05/2013    Procedure:  C3-4 Anterior Cervical Discectomy and Fusion, Allograft, Plate;  Surgeon: [name], MD;  Location: [facility];  Service: Orthopedics;  Laterality: N/A;  C3-4 Anterior Cervical Discectomy and Fusion, Allograft, Plate  . Retinal detachment surgery    . Transurethral resection of bladder tumor Right 07/27/2015    Procedure: TRANSURETHRAL RESECTION OF BLADDER TUMOR (TURBT);  Surgeon: [name], MD;  Location: [facility];  Service: Urology;  Laterality: Right;  . Eye surgery       Allergies  Allergen Reactions  . Sulfonamide Derivatives Itching, Rash and Other (See Comments)    whelps    No current facility-administered medications on file prior to encounter.   Current Outpatient Prescriptions on File Prior to Encounter  Medication Sig  . acetaminophen (TYLENOL) 500 MG tablet Take 1,000-1,500 mg by mouth every 6 (six) hours as needed for mild pain or headache. For pain  . ibuprofen (ADVIL,MOTRIN) 200 MG tablet Take 200-400 mg by mouth every 6 (six) hours as needed for moderate  pain.   . Multiple Vitamin (MULTIVITAMIN WITH MINERALS) TABS tablet Take 1 tablet by mouth daily.  . NON FORMULARY cpap  . NON FORMULARY Apply 1 application topically as needed (C). Compound Cream from MD office  . silodosin (RAPAFLO) 8 MG CAPS capsule Take 8 mg by mouth daily with breakfast.  . valsartan-hydrochlorothiazide (DIOVAN-HCT) 320-12.5 MG per tablet Take 1 tablet by mouth daily.      FAMILY HISTORY:  Family History  Problem Relation Age of Onset  . Hypertension Father   . Cancer Father     Prostate  . Depression Mother   . Hypertension Mother   . Alcohol abuse Mother   . Hypertension Sister   . Anxiety disorder Sister   . Alcohol abuse Maternal Grandfather   . ADD / ADHD Neg Hx   . Bipolar disorder Neg Hx   . Dementia Neg Hx   . Drug abuse Neg Hx   . OCD Neg Hx   . Paranoid behavior Neg Hx   . Schizophrenia Neg Hx   . Seizures Neg Hx   . Sexual abuse Neg Hx   . Physical abuse Neg Hx     SOCIAL HISTORY: Social History  Substance Use Topics  . Smoking status: Former Research scientist (life sciences)  . Smokeless tobacco: Never Used     Comment: quit smoking 30+ yrs ago  . Alcohol Use: No    REVIEW OF SYSTEMS:  Unable to obtain as patient is intubated and sedated  SUBJECTIVE: As above.  VITAL SIGNS: BP 163/86 mmHg  Pulse 63  Temp(Src) 98 F (36.7 C) (Oral)  Resp 20  Wt 224 lb (101.606 kg)  SpO2 99%  HEMODYNAMICS:    VENTILATOR SETTINGS:    INTAKE / OUTPUT:    PHYSICAL EXAMINATION: General: elderly gentleman, intubated and sedated, NAD Neuro: Intubated and sedated. Wife at bedside.  HEENT: Moist mucous membranes, PERRL,  Cardiovascular: S1S2, no murmur,  Lungs: Course bilateral anterior lung fields,  Abdomen: BS hypoactive,  Musculoskeletal: sedated Skin: Midline incision mid- back, covered with operative dressing.   LABS:  BMET  Recent Labs Lab 07/15/16 1239  NA 141  K 3.4*    Electrolytes No results for input(s): CALCIUM, MG, PHOS in the last 168  hours.  CBC  Recent Labs Lab 07/15/16 1239  HGB 11.9*  HCT 35.0*    Coag's No results for input(s): APTT, INR in the last 168 hours.  Sepsis Markers No results for input(s): LATICACIDVEN, PROCALCITON, O2SATVEN in the last 168 hours.  ABG  Recent Labs Lab 07/15/16 1239  PHART 7.281*  PCO2ART 54.7*  PO2ART 124.0*    Liver Enzymes No results for input(s): AST, ALT, ALKPHOS, BILITOT, ALBUMIN in the last 168 hours.  Cardiac Enzymes No results for input(s): TROPONINI, PROBNP in the last 168 hours.  Glucose No results for input(s): GLUCAP in the last 168 hours.  Imaging No  results found.   STUDIES:  CTA Chest/Abd/Pelvis 7/19: Negative for bleed, PE or dissection. TTE 7/19>>>  MICROBIOLOGY: MRSA PCR>>>  ANTIBIOTICS: Ancef Peri-op  SIGNIFICANT EVENTS: 7/19 - surgery aborted 2/2 hypotensive event. PCCM consulted for evaluation  LINES/TUBES: Back Drain 7/19>>> OETT 7.5 7/19>>> FOLEY 7/19>>> PIV x2  DISCUSSION: 70 yo male with PMH of HTN, pre-diabetes, OSA with CPAP, bladder cancer, post-op nausea and vomiting. PCCM was consulted for evaluation of hypotension during lumbar surgery.   ASSESSMENT / PLAN:  PULMONARY A: Acute Hypercarbic Respiratory Failure - Mild. Intubation - Periop. Dependent Lung Opacities  P:   Full Vent Support SBT & WUA in AM Vent Bundle ABG now  CARDIOVASCULAR A:  Hypotension - Improving. H/O Hypertension Tachycardia - resolved Right BBB QTc 466 on 7/19  P:  Continuous Telemetry Monitoring Vitals per unit protocol Continue to wean Neo-Synephrine for MAP > 65 Trending Troponin I q6hr TTE pending Holding home Diovan in the setting of hypotension  RENAL A:   Hypokalemia - Replacing.  P:   Monitor I&O Increase LR to 75 ml/hr Replace electrolytes as needed F/U BMP, Mg and Phos KCl IV  GASTROINTESTINAL A:   NPO  P:   NPO Pepcid 20 mg IV daily  HEMATOLOGIC A:   Anemia - Secondary to acute blood loss -  14.9 > 11.9. No signs of active bleeding.  P:  Trending cell counts w/ CBC Transfuse for hgb < 7.0 SCD for DVT prophylaxis Holding chemoprophylaxis in the setting of surgery  INFECTIOUS A:   No acute issues  P:   Ancef intra-op No indication for further abx Follow fever curve Tylenol for temp > 101.5  ENDOCRINE A:   H/O Pre-Diabetes  P:   CBG Q4 hours  Can add SSI if needed Holding on further steroids  NEUROLOGIC A:   Unstable L3-L5 - surgery aborted 1/2 way thru. Post-Op Pain Control Sedation on Ventilator  P:   RASS goal: 0 to -1 Fentanyl and Versed PRN for pain management /ventilator tolerance He will need to return to the OR for completion of surgery - spine is currently unstable May have HOB elevated per neurosurgery and log roll - no other mobility until spine is stabilized.  Continue neuro exams   FAMILY  - Updates: Wife updated at bedside by Dr. Ashok Cordia 7/19.  - Inter-disciplinary family meet or Palliative Care meeting due by: 07/22/16   Erick Colace ACNP-BC [place] Pager # [phone] OR # [phone] if no answer   PCCM Attending Note: Patient seen and examined with nurse practitioner. Please refer to his consultation note which I have reviewed in detail. Patient with intraoperative hypotension. Differential diagnosis includes air embolism versus anaphylactic shock versus non-ST elevation MI. Patient has known right bundle branch block. Echocardiogram obtained and pending. Patient will need completion of surgery once he is stabilized. Wife updated at bedside. Continuing close ICU monitoring and weaning of vasopressor infusion. Continuing intermittent Versed and fentanyl for sedation and pain relief.  I have spent a total of 32 minutes of critical care time today caring for the patient, discussing the plan of care with primary service, and reviewing the patient's electronic medical record.  Sonia Baller Ashok Cordia, M.D. [facility]  Pulmonary & Critical Care Pager:  [phone] After 3pm or if no response, call [phone] 4:03 PM 07/15/2016

## 2016-07-16 ENCOUNTER — Inpatient Hospital Stay (HOSPITAL_COMMUNITY): Payer: Medicare Other

## 2016-07-16 DIAGNOSIS — D62 Acute posthemorrhagic anemia: Secondary | ICD-10-CM

## 2016-07-16 LAB — CBC WITH DIFFERENTIAL/PLATELET
BASOS ABS: 0 10*3/uL (ref 0.0–0.1)
BASOS PCT: 0 %
Eosinophils Absolute: 0 10*3/uL (ref 0.0–0.7)
Eosinophils Relative: 0 %
HEMATOCRIT: 32.7 % — AB (ref 39.0–52.0)
HEMOGLOBIN: 10.8 g/dL — AB (ref 13.0–17.0)
LYMPHS PCT: 5 %
Lymphs Abs: 0.8 10*3/uL (ref 0.7–4.0)
MCH: 29.2 pg (ref 26.0–34.0)
MCHC: 33 g/dL (ref 30.0–36.0)
MCV: 88.4 fL (ref 78.0–100.0)
MONO ABS: 0.3 10*3/uL (ref 0.1–1.0)
MONOS PCT: 2 %
NEUTROS ABS: 16 10*3/uL — AB (ref 1.7–7.7)
NEUTROS PCT: 93 %
Platelets: 153 10*3/uL (ref 150–400)
RBC: 3.7 MIL/uL — ABNORMAL LOW (ref 4.22–5.81)
RDW: 13.5 % (ref 11.5–15.5)
WBC: 17.2 10*3/uL — ABNORMAL HIGH (ref 4.0–10.5)

## 2016-07-16 LAB — BLOOD GAS, ARTERIAL
ACID-BASE EXCESS: 2.2 mmol/L — AB (ref 0.0–2.0)
BICARBONATE: 26.3 meq/L — AB (ref 20.0–24.0)
Drawn by: 39898
FIO2: 0.5
LHR: 14 {breaths}/min
O2 SAT: 97.6 %
PATIENT TEMPERATURE: 98.6
PCO2 ART: 41.4 mmHg (ref 35.0–45.0)
PEEP/CPAP: 5 cmH2O
PH ART: 7.42 (ref 7.350–7.450)
TCO2: 27.6 mmol/L (ref 0–100)
VT: 620 mL
pO2, Arterial: 95.8 mmHg (ref 80.0–100.0)

## 2016-07-16 LAB — RENAL FUNCTION PANEL
ANION GAP: 6 (ref 5–15)
Albumin: 3 g/dL — ABNORMAL LOW (ref 3.5–5.0)
BUN: 8 mg/dL (ref 6–20)
CHLORIDE: 106 mmol/L (ref 101–111)
CO2: 26 mmol/L (ref 22–32)
Calcium: 8.1 mg/dL — ABNORMAL LOW (ref 8.9–10.3)
Creatinine, Ser: 1.03 mg/dL (ref 0.61–1.24)
GFR calc Af Amer: >60 mL/min (ref >60)
GFR calc non Af Amer: >60 mL/min (ref >60)
GLUCOSE: 138 mg/dL — AB (ref 65–99)
PHOSPHORUS: 2.6 mg/dL (ref 2.5–4.6)
POTASSIUM: 4.1 mmol/L (ref 3.5–5.1)
Sodium: 138 mmol/L (ref 135–145)

## 2016-07-16 LAB — TROPONIN I: Troponin I: <0.03 ng/mL (ref <0.03)

## 2016-07-16 LAB — MAGNESIUM: Magnesium: 1.4 mg/dL — ABNORMAL LOW (ref 1.7–2.4)

## 2016-07-16 LAB — CALCIUM, IONIZED: Calcium, Ionized, Serum: 4.7 mg/dL (ref 4.5–5.6)

## 2016-07-16 MED ORDER — MAGNESIUM SULFATE 2 GM/50ML IV SOLN
2.0000 g | Freq: Once | INTRAVENOUS | Status: AC
  Administered 2016-07-16: 2 g via INTRAVENOUS
  Filled 2016-07-16: qty 50

## 2016-07-16 MED ORDER — FENTANYL CITRATE (PF) 100 MCG/2ML IJ SOLN: 25.0000 ug | INTRAMUSCULAR | Status: DC | PRN

## 2016-07-16 MED ORDER — SODIUM CHLORIDE 0.9 % IV BOLUS (SEPSIS)
250.0000 mL | Freq: Once | INTRAVENOUS | Status: AC
  Administered 2016-07-16: 250 mL via INTRAVENOUS

## 2016-07-16 MED FILL — Sodium Chloride IV Soln 0.9%: INTRAVENOUS | Qty: 2000 | Status: AC

## 2016-07-16 MED FILL — Heparin Sodium (Porcine) Inj 1000 Unit/ML: INTRAMUSCULAR | Qty: 30 | Status: AC

## 2016-07-16 NOTE — Progress Notes (Signed)
PULMONARY / CRITICAL CARE MEDICINE   Name: Zachary [name] MRN: [id number] DOB: 13-Jul-1946    ADMISSION DATE:  07/15/2016   CONSULTATION DATE: 07/15/2016  REFERRING MD:  Dr Saintclair Halsted  CHIEF COMPLAINT: hypotension during surgery  HISTORY OF PRESENT ILLNESS:   70 yo male with PMH of HTN, pre-diabetes, OSA with CPAP, bladder cancer,BPH, post-op nausea and vomiting, s/p transurethral resection of bladder tumor 07/27/15, Anterior cervical discectomy 06/05/13 and cataract extraction in 01/09/13 and 12/19/12. PCCM was consulted for evaluation of hypotension during surgery. In brief patient was having scheduled decompressive lumbar laminectomy with complete medial facetectomies and radical foraminotomies at L3-4 and L4-5 and posterior lumbar interbody fusion L3-4 and L4-5. Anesthesia began at [phone] and was unremarkable until 1205 when his BP dropped suddenly to the [id number] systolic after cellsaver infusion. Neo-synephrine was given without much response. He was also given 2 liters crystalloid and 500 ml of Albumin, vasopressin, epinephrine, benadryl and hydrocortisone. H/H was acceptable at 11.9/35. CT chest, abd pelvis did not indicate any acute bleeding. A CTA was negative for PE. He was then transferred intubated to 74M for further evaluation.    SUBJECTIVE:   No sig change overnight.  Awake but poor Vt on SBT.  Did NOT require pressors overnight.   VITAL SIGNS: BP 154/64 mmHg  Pulse 71  Temp(Src) 97.5 F (36.4 C) (Oral)  Resp 12  Wt 101.606 kg (224 lb)  SpO2 100%  HEMODYNAMICS:    VENTILATOR SETTINGS: Vent Mode:  [-] PSV;CPAP FiO2 (%):  [40 %-100 %] 40 % Set Rate:  [14 bmp] 14 bmp Vt Set:  [620 mL] 620 mL PEEP:  [5 cmH20] 5 cmH20 Pressure Support:  [5 cmH20] 5 cmH20 Plateau Pressure:  [16 cmH20-17 cmH20] 16 cmH20  INTAKE / OUTPUT: I/O last 3 completed shifts: In: 4974.7 [I.V.:3899.7; Blood:175; IV [id number] Out: [id number] [Urine:2975; Drains:580; Blood:600]  PHYSICAL EXAMINATION: General:  elderly gentleman, intubated, NAD Neuro: awake, follows commands, communicates needs, MAE  HEENT: Moist mucous membranes, PERRL, ETT Cardiovascular: S1S2, no murmur,  Lungs: resps even non labored on vent, poor Vt on attempts at PS wean, rhonchus  Abdomen: BS hypoactive,  Musculoskeletal: sedated Skin: Midline incision mid- back, covered with operative dressing.   LABS:  BMET  Recent Labs Lab 07/15/16 1239 07/15/16 1527 07/16/16 0400  NA 141 137 138  K 3.4* 3.1* 4.1  CL  --  105 106  CO2  --  27 26  BUN  --  9 8  CREATININE  --  1.15 1.03  GLUCOSE  --  177* 138*    Electrolytes  Recent Labs Lab 07/15/16 1527 07/16/16 0400  CALCIUM 7.8* 8.1*  MG 1.3* 1.4*  PHOS 3.1 2.6    CBC  Recent Labs Lab 07/15/16 1239 07/15/16 1527 07/16/16 0400  WBC  --  20.6* 17.2*  HGB 11.9* 11.1* 10.8*  HCT 35.0* 34.1* 32.7*  PLT  --  164 153    Coag's No results for input(s): APTT, INR in the last 168 hours.  Sepsis Markers No results for input(s): LATICACIDVEN, PROCALCITON, O2SATVEN in the last 168 hours.  ABG  Recent Labs Lab 07/15/16 1239 07/15/16 1615 07/16/16 0342  PHART 7.281* 7.415 7.420  PCO2ART 54.7* 40.5 41.4  PO2ART 124.0* 105* 95.8    Liver Enzymes  Recent Labs Lab 07/16/16 0400  ALBUMIN 3.0*    Cardiac Enzymes  Recent Labs Lab 07/15/16 1527 07/15/16 2155 07/16/16 0400  TROPONINI <0.03 <0.03 <0.03    Glucose No results for input(s):  GLUCAP in the last 168 hours.  Imaging Dg Chest Port 1 View  07/16/2016  CLINICAL DATA:  Respiratory failure. EXAM: PORTABLE CHEST 1 VIEW COMPARISON:  07/15/2016.  05/17/2016. FINDINGS: Endotracheal tube tip 4 cm above the carina. Mediastinum and hilar structures normal. Heart size stable. Low lung volumes with bibasilar atelectasis. Unchanged persist left lower lobe infiltrate. Small left pleural effusion cannot be excluded. No pneumothorax. IMPRESSION: 1.  Endotracheal tube tip 4 cm above the carina. 2. Low  lung volumes with persistent bibasilar atelectasis. Persistent unchanged left lower lobe infiltrate. Small left pleural effusion. Electronically Signed   By: Marcello Moores  Register   On: 07/16/2016 07:21   Dg Chest Port 1 View  07/15/2016  CLINICAL DATA:  Hypotensive event during surgery; hypoxia EXAM: PORTABLE CHEST 1 VIEW COMPARISON:  May 17, 2016 FINDINGS: Endotracheal tube tip is 5.2 cm above the carina. No pneumothorax. There is a focal area of airspace consolidation in the left base. Lungs elsewhere clear. Heart is upper normal in size with pulmonary vascularity within normal limits. No adenopathy. No bone lesions. IMPRESSION: Endotracheal tube as described without pneumothorax. Focal airspace consolidation left base. Question pneumonia versus aspiration as most likely etiologies for this opacity. Lungs elsewhere clear. Stable cardiac silhouette. Electronically Signed   By: Lowella Grip III M.D.   On: 07/15/2016 15:29   Dg C-arm 1-60 Min-no Report  07/15/2016  CLINICAL DATA: L3-5 PLIF C-ARM 1-60 MINUTES Fluoroscopy was utilized by the requesting physician.  No radiographic interpretation.   Ct Angio Chest/abd/pel For Dissection W And/or W/wo  07/15/2016  CLINICAL DATA:  Acute onset of hypotension and tachycardia during lumbar spine fusion. Evaluate for pulmonary embolism, vascular injury and retroperitoneal hematoma. EXAM: CT ANGIOGRAPHY CHEST, ABDOMEN AND PELVIS TECHNIQUE: Multidetector CT imaging through the chest, abdomen and pelvis was performed using the standard protocol during bolus administration of intravenous contrast. Multiplanar reconstructed images and MIPs were obtained and reviewed to evaluate the vascular anatomy. CONTRAST:  100 ml Isovue 370. COMPARISON:  Abdominal pelvic CT 12/26/2015 and 07/31/2015. FINDINGS: CTA CHEST FINDINGS Cardiovascular: Pre contrast images demonstrate no displaced intimal calcifications within the thoracic aorta. There is atherosclerosis of the aorta and  coronary arteries. Post-contrast, there is adequate opacification of the pulmonary arteries and systemic arteries. No evidence of acute pulmonary embolism. No evidence of aortic aneurysm or dissection. The heart size is normal. There is no pericardial effusion. Mediastinum/Nodes: There are no enlarged mediastinal, hilar or axillary lymph nodes. Small hiatal hernia. Lungs/Pleura: There is no pleural effusion. There are dependent confluent airspace opacities in both lower lobes with associated volume loss and air bronchograms. Appearance is suspicious for possible aspiration. There is no endobronchial lesion. Endotracheal tube tip is in the midtrachea. Musculoskeletal/Chest wall: There is no chest wall mass or suspicious osseous finding. Review of the MIP images confirms the above findings. CTA ABDOMEN AND PELVIS FINDINGS Vascular: Compared with the prior CT, there is mildly percent dilatation of the right common iliac artery which measures up to 1.9 cm on coronal image 67 of series 504. There is mild associated intimal irregularity. The abdominal aorta is normal in caliber. There is no evidence of aortic dissection or retroperitoneal hematoma. There is no evidence of branch vessel injury or large vessel occlusion. There is a small accessory renal artery on the right. Atherosclerosis of the aorta and its branch vessels is noted. Venous opacification is limited without apparent acute injury. Hepatobiliary: The liver is normal in density without focal abnormality. No evidence of gallstones, gallbladder wall  thickening or biliary dilatation. Pancreas: Unremarkable. No pancreatic ductal dilatation or surrounding inflammatory changes. Spleen: Normal in size without focal abnormality. Stable small splenule. Adrenals/Urinary Tract: Both adrenal glands appear normal. Both kidneys demonstrate mild cortical thinning and small low-density lesions, grossly stable and likely cysts. There are possible small renal caliceal calculi  bilaterally. There is no evidence of hydronephrosis or ureteral calculus. However, there is possible wall thickening of the left renal pelvis with adjacent perinephric soft tissue stranding which may be mildly progressive compared with prior CTs. The left ureter appears normal in caliber. The urinary bladder is partially decompressed by a Foley catheter and is suboptimally evaluated. Stomach/Bowel: No evidence of bowel wall thickening, distention or surrounding inflammatory change. Mild sigmoid diverticulosis. The appendix appears normal. Vascular/Lymphatic: Vascular findings described above. There are no enlarged abdominal pelvic lymph nodes. Reproductive: The prostate gland appears unremarkable. Other: Stable prominent fat in both inguinal canals. Stable small umbilical hernia containing only fat. Musculoskeletal: No acute or significant osseous findings. There are postsurgical changes status post laminectomy and interbody fusion at L3-4 and L4-5. Surgical drain is in place. No unexpected fluid collections are identified. Review of the MIP images confirms the above findings. IMPRESSION: 1. No evidence of acute pulmonary embolism. 2. Dependent airspace opacities with air bronchograms in both lung bases suspicious for possible aspiration. 3. No evidence of retroperitoneal hematoma or acute vascular injury. 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 4. Expected postsurgical changes related to recent L3-4 and L4-5 posterior decompression and interbody fusion. 5. Nonobstructing bilateral renal calculi. Possible progressive wall thickening of the left renal pelvis and adjacent perinephric soft tissue stranding. Given the patient's history of bladder cancer, urology follow-up recommended. Follow up CT to include pre contrast and delayed post-contrast imaging may be helpful to exclude a developing urothelial lesion. 6. The study was reviewed as it was being performed on 07/15/2016 at 1:50 pm with Dr. Kary Kos .  Electronically Signed   By: Richardean Sale M.D.   On: 07/15/2016 14:35     STUDIES:  CTA Chest/Abd/Pelvis 7/19: Negative for bleed, PE or dissection. TTE 7/19:  EF [id number], grade 1 diastolic dysfunction, trivial MR, mildly dilated RA, PA peak press 95mmHg. No regional wall motion abnormalities.  Port CXR 7/20:  MICROBIOLOGY: MRSA PCR 7/11:  Negative   ANTIBIOTICS: Ancef Peri-op  SIGNIFICANT EVENTS: 7/19 - surgery aborted 2/2 hypotensive event. PCCM consulted for evaluation 7/20 - extubated  LINES/TUBES: OETT 7.5 7/19 - 7/20 Back Drain 7/19>>> R rad aline 7/19>>> PIV x3  DISCUSSION: 69 yo male with PMH of HTN, pre-diabetes, OSA with CPAP, bladder cancer, post-op nausea and vomiting. PCCM was consulted for evaluation of hypotension during lumbar surgery which was aborted.   ASSESSMENT / PLAN:  PULMONARY A: Acute Hypercarbic Respiratory Failure - Mild. Resolved. Intubation - Periop. Now Extubated. Dependent Lung Opacities  P:   SBT, PS wean  Suspect can extubate this am  F/u CXR   CARDIOVASCULAR A:  Severe Hypotension intra-op- Resolved. Unclear etiology.  Differential diagnosis includes air embolism versus anaphylactic shock. Trop I neg. H/O Hypertension Tachycardia - resolved Right BBB  P:  Continuous Telemetry Monitoring Vitals per unit protocol Trending Troponin I q6hr - negative thus far  No sig change on echo  Holding home Diovan in the setting of hypotension  RENAL A:   Hypomagnesemia - Replaced. Hypokalemia - Resolved.  P:   Monitor I&O IVF 45ml/hr  Magnesium Sulfate 2gm IV F/U BMP, Mg and Phos  GASTROINTESTINAL A:  NPO  P:   NPO Pepcid 20 mg IV daily  HEMATOLOGIC A:   Anemia - Secondary to acute blood loss - 14.9 > 11.9. No signs of active bleeding. Leukocytosis - Likely stress response. Improving.  P:  Trending cell counts w/ CBC Transfuse for hgb < 7.0 SCD for DVT prophylaxis Holding chemoprophylaxis in the setting of  surgery  INFECTIOUS A:   No acute issues  P:   Ancef intra-op No indication for further abx Follow fever curve Tylenol for temp > 101.5  ENDOCRINE A:   H/O Pre-Diabetes  P:   CBG Q4 hours  Can add SSI if needed Holding on further steroids  NEUROLOGIC A:   Unstable L3-L5 - surgery aborted 1/2 way thru. Post-Op Pain Control Sedation on Ventilator  P:   RASS goal: 0 to -1 PRN fent, versed  He will need to return to the OR for completion of surgery - spine is currently unstable - nsgy following  May have HOB elevated per neurosurgery and log roll - no other mobility until spine is stabilized.  Continue neuro exams   FAMILY  - Updates: Wife updated at bedside 7/20  - Inter-disciplinary family meet or Palliative Care meeting due by: 07/22/16    Nickolas Madrid, NP 07/16/2016  9:31 AM Pager: [phone] or [phone]  PCCM Attending Note: Patient seen and examined with nurse practitioner. Please refer to her progress note which I have reviewed in detail. Successfully extubated this morning. Continuing incentive spirometry for pulmonary toilette. Etiology for patient's hypotension in the operating room still unclear. Anaphylactic reaction is certainly possible as well as air embolism. Without focal wall motion abnormality and in setting of normal troponin I doubtful patient had acute ischemic event. Further postoperative care per surgery. Continue close monitoring of respiratory status and blood pressure in the intensive care unit postextubation.  I spent a total of 31 minutes of critical care time today caring for the patient and reviewing the patient's electronic medical record.  Sonia Baller Ashok Cordia, M.D. [facility] Pulmonary & Critical Care Pager:  [phone] After 3pm or if no response, call [phone] 11:50 AM 07/16/2016

## 2016-07-16 NOTE — Clinical Documentation Improvement (Signed)
Neuro Surgery  Documentation of "hypotension" during surgery, is this a complication of the procedure?  Thank you    Yes , hypotension is a complication of the surgical procedure  No, hypotension is not a complication of the surgical procedure  Other  Clinically Undetermined  Supporting Information: Aborted spinal fusion     Please exercise your independent, professional judgment when responding. A specific answer is not anticipated or expected.   Thank You, [place] [phone]

## 2016-07-16 NOTE — Procedures (Signed)
Extubation Procedure Note  Patient Details:   Name: Zachary [name] DOB: 1946/10/08 MRN: [id number]   Pt extubated to 4L [place] per MD order. Pt able to vocalize, no stridor noted, VS WNL. Pt tolerating well at this time, RT will continue to monitor.    Evaluation  O2 sats: stable throughout Complications: No apparent complications Patient did tolerate procedure well. Bilateral Breath Sounds: Clear   Yes  Jetty Peeks 07/16/2016, 9:57 AM

## 2016-07-16 NOTE — Progress Notes (Signed)
RT NOTE:  Pt brought in home CPAP. Machine and wiring appear normal, no damages. Pt will put on CPAP when he is ready for sleep. Pt will call RT if assistance needed.

## 2016-07-16 NOTE — Progress Notes (Signed)
[place] Progress Note Patient Name: YANG GEGENHEIMER DOB: 1946/08/02 MRN: [id number]   Date of Service  07/16/2016  HPI/Events of Note  Agitated on MV, on prop 63mcg with soft BP  eICU Interventions  Give PRN versed 1mg , can give 250cc ns bolus if SBP is soft.      Intervention Category Minor Interventions: Agitation / anxiety - evaluation and management  Darell Saputo 07/16/2016, 12:08 AM

## 2016-07-16 NOTE — Progress Notes (Signed)
Subjective: Patient reports Remains intubated and sedated but denies any pain in his legs he reports pain is back  Objective: Vital signs in last 24 hours: Temp:  [97.5 F (36.4 C)-98 F (36.7 C)] 97.5 F (36.4 C) (07/20 0400) Pulse Rate:  [58-86] 75 (07/20 0730) Resp:  [0-25] 18 (07/20 0730) BP: (81-163)/(46-86) 120/64 mmHg (07/20 0730) SpO2:  [98 %-100 %] 100 % (07/20 0730) Arterial Line BP: (76-177)/(42-93) 96/59 mmHg (07/20 0730) FiO2 (%):  [40 %-100 %] 40 % (07/20 0400) Weight:  [101.606 kg (224 lb)] 101.606 kg (224 lb) (07/19 0747)  Intake/Output from previous day: 07/19 0701 - 07/20 0700 In: 4974.7 [I.V.:3899.7; Blood:175; IV [id number] Out: [id number] [Urine:2975; Drains:580; Blood:600] Intake/Output this shift:    O [place] commands moves all extremities  Lab Results:  Recent Labs  07/15/16 1527 07/16/16 0400  WBC 20.6* 17.2*  HGB 11.1* 10.8*  HCT 34.1* 32.7*  PLT 164 153   BMET  Recent Labs  07/15/16 1527 07/16/16 0400  NA 137 138  K 3.1* 4.1  CL 105 106  CO2 27 26  GLUCOSE 177* 138*  BUN 9 8  CREATININE 1.15 1.03  CALCIUM 7.8* 8.1*    Studies/Results: Dg Chest Port 1 View  07/16/2016  CLINICAL DATA:  Respiratory failure. EXAM: PORTABLE CHEST 1 VIEW COMPARISON:  07/15/2016.  05/17/2016. FINDINGS: Endotracheal tube tip 4 cm above the carina. Mediastinum and hilar structures normal. Heart size stable. Low lung volumes with bibasilar atelectasis. Unchanged persist left lower lobe infiltrate. Small left pleural effusion cannot be excluded. No pneumothorax. IMPRESSION: 1.  Endotracheal tube tip 4 cm above the carina. 2. Low lung volumes with persistent bibasilar atelectasis. Persistent unchanged left lower lobe infiltrate. Small left pleural effusion. Electronically Signed   By: Marcello Moores  Register   On: 07/16/2016 07:21   Dg Chest Port 1 View  07/15/2016  CLINICAL DATA:  Hypotensive event during surgery; hypoxia EXAM: PORTABLE CHEST 1 VIEW COMPARISON:  May 17, 2016 FINDINGS: Endotracheal tube tip is 5.2 cm above the carina. No pneumothorax. There is a focal area of airspace consolidation in the left base. Lungs elsewhere clear. Heart is upper normal in size with pulmonary vascularity within normal limits. No adenopathy. No bone lesions. IMPRESSION: Endotracheal tube as described without pneumothorax. Focal airspace consolidation left base. Question pneumonia versus aspiration as most likely etiologies for this opacity. Lungs elsewhere clear. Stable cardiac silhouette. Electronically Signed   By: Lowella Grip III M.D.   On: 07/15/2016 15:29   Dg C-arm 1-60 Min-no Report  07/15/2016  CLINICAL DATA: L3-5 PLIF C-ARM 1-60 MINUTES Fluoroscopy was utilized by the requesting physician.  No radiographic interpretation.   Ct Angio Chest/abd/pel For Dissection W And/or W/wo  07/15/2016  CLINICAL DATA:  Acute onset of hypotension and tachycardia during lumbar spine fusion. Evaluate for pulmonary embolism, vascular injury and retroperitoneal hematoma. EXAM: CT ANGIOGRAPHY CHEST, ABDOMEN AND PELVIS TECHNIQUE: Multidetector CT imaging through the chest, abdomen and pelvis was performed using the standard protocol during bolus administration of intravenous contrast. Multiplanar reconstructed images and MIPs were obtained and reviewed to evaluate the vascular anatomy. CONTRAST:  100 ml Isovue 370. COMPARISON:  Abdominal pelvic CT 12/26/2015 and 07/31/2015. FINDINGS: CTA CHEST FINDINGS Cardiovascular: Pre contrast images demonstrate no displaced intimal calcifications within the thoracic aorta. There is atherosclerosis of the aorta and coronary arteries. Post-contrast, there is adequate opacification of the pulmonary arteries and systemic arteries. No evidence of acute pulmonary embolism. No evidence of aortic aneurysm or dissection. The heart  size is normal. There is no pericardial effusion. Mediastinum/Nodes: There are no enlarged mediastinal, hilar or axillary lymph  nodes. Small hiatal hernia. Lungs/Pleura: There is no pleural effusion. There are dependent confluent airspace opacities in both lower lobes with associated volume loss and air bronchograms. Appearance is suspicious for possible aspiration. There is no endobronchial lesion. Endotracheal tube tip is in the midtrachea. Musculoskeletal/Chest wall: There is no chest wall mass or suspicious osseous finding. Review of the MIP images confirms the above findings. CTA ABDOMEN AND PELVIS FINDINGS Vascular: Compared with the prior CT, there is mildly percent dilatation of the right common iliac artery which measures up to 1.9 cm on coronal image 67 of series 504. There is mild associated intimal irregularity. The abdominal aorta is normal in caliber. There is no evidence of aortic dissection or retroperitoneal hematoma. There is no evidence of branch vessel injury or large vessel occlusion. There is a small accessory renal artery on the right. Atherosclerosis of the aorta and its branch vessels is noted. Venous opacification is limited without apparent acute injury. Hepatobiliary: The liver is normal in density without focal abnormality. No evidence of gallstones, gallbladder wall thickening or biliary dilatation. Pancreas: Unremarkable. No pancreatic ductal dilatation or surrounding inflammatory changes. Spleen: Normal in size without focal abnormality. Stable small splenule. Adrenals/Urinary Tract: Both adrenal glands appear normal. Both kidneys demonstrate mild cortical thinning and small low-density lesions, grossly stable and likely cysts. There are possible small renal caliceal calculi bilaterally. There is no evidence of hydronephrosis or ureteral calculus. However, there is possible wall thickening of the left renal pelvis with adjacent perinephric soft tissue stranding which may be mildly progressive compared with prior CTs. The left ureter appears normal in caliber. The urinary bladder is partially decompressed by a  Foley catheter and is suboptimally evaluated. Stomach/Bowel: No evidence of bowel wall thickening, distention or surrounding inflammatory change. Mild sigmoid diverticulosis. The appendix appears normal. Vascular/Lymphatic: Vascular findings described above. There are no enlarged abdominal pelvic lymph nodes. Reproductive: The prostate gland appears unremarkable. Other: Stable prominent fat in both inguinal canals. Stable small umbilical hernia containing only fat. Musculoskeletal: No acute or significant osseous findings. There are postsurgical changes status post laminectomy and interbody fusion at L3-4 and L4-5. Surgical drain is in place. No unexpected fluid collections are identified. Review of the MIP images confirms the above findings. IMPRESSION: 1. No evidence of acute pulmonary embolism. 2. Dependent airspace opacities with air bronchograms in both lung bases suspicious for possible aspiration. 3. No evidence of retroperitoneal hematoma or acute vascular injury. 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 4. Expected postsurgical changes related to recent L3-4 and L4-5 posterior decompression and interbody fusion. 5. Nonobstructing bilateral renal calculi. Possible progressive wall thickening of the left renal pelvis and adjacent perinephric soft tissue stranding. Given the patient's history of bladder cancer, urology follow-up recommended. Follow up CT to include pre contrast and delayed post-contrast imaging may be helpful to exclude a developing urothelial lesion. 6. The study was reviewed as it was being performed on 07/15/2016 at 1:50 pm with Dr. Kary Kos . Electronically Signed   By: Richardean Sale M.D.   On: 07/15/2016 14:35    Assessment/Plan: Postop day 1 from aborted lumbar fusion procedure will let the patient be extubated and communicate and then depending on what workup reveals as the source of his hypotensive episode when cleared by critical care we will schedule the completion of  his fusion.  LOS: 1 day  Akhil Piscopo P 07/16/2016, 7:45 AM

## 2016-07-17 ENCOUNTER — Inpatient Hospital Stay (HOSPITAL_COMMUNITY): Payer: Medicare Other | Admitting: Certified Registered Nurse Anesthetist

## 2016-07-17 ENCOUNTER — Encounter (HOSPITAL_COMMUNITY)
Admission: RE | Disposition: A | Discharge status: Skilled Nursing Facility | Payer: Self-pay | Source: Ambulatory Visit | Attending: Neurosurgery

## 2016-07-17 ENCOUNTER — Inpatient Hospital Stay (HOSPITAL_COMMUNITY): Payer: Medicare Other

## 2016-07-17 LAB — CBC
HCT: 30.6 % — ABNORMAL LOW (ref 39.0–52.0)
Hemoglobin: 10.1 g/dL — ABNORMAL LOW (ref 13.0–17.0)
MCH: 29.2 pg (ref 26.0–34.0)
MCHC: 33 g/dL (ref 30.0–36.0)
MCV: 88.4 fL (ref 78.0–100.0)
PLATELETS: 179 10*3/uL (ref 150–400)
RBC: 3.46 MIL/uL — ABNORMAL LOW (ref 4.22–5.81)
RDW: 13.9 % (ref 11.5–15.5)
WBC: 18.2 10*3/uL — AB (ref 4.0–10.5)

## 2016-07-17 LAB — BASIC METABOLIC PANEL
Anion gap: 6 (ref 5–15)
BUN: 15 mg/dL (ref 6–20)
CALCIUM: 8.1 mg/dL — AB (ref 8.9–10.3)
CHLORIDE: 107 mmol/L (ref 101–111)
CO2: 28 mmol/L (ref 22–32)
CREATININE: 1.08 mg/dL (ref 0.61–1.24)
GFR calc Af Amer: >60 mL/min (ref >60)
Glucose, Bld: 150 mg/dL — ABNORMAL HIGH (ref 65–99)
Potassium: 4.2 mmol/L (ref 3.5–5.1)
SODIUM: 141 mmol/L (ref 135–145)

## 2016-07-17 LAB — POCT I-STAT 4, (NA,K, GLUC, HGB,HCT)
Glucose, Bld: 138 mg/dL — ABNORMAL HIGH (ref 65–99)
HEMATOCRIT: 27 % — AB (ref 39.0–52.0)
Hemoglobin: 9.2 g/dL — ABNORMAL LOW (ref 13.0–17.0)
Potassium: 4.1 mmol/L (ref 3.5–5.1)
Sodium: 144 mmol/L (ref 135–145)

## 2016-07-17 SURGERY — POSTERIOR LUMBAR FUSION 2 LEVEL
Anesthesia: General | Site: Spine Lumbar

## 2016-07-17 MED ORDER — BUPIVACAINE LIPOSOME 1.3 % IJ SUSP
20.0000 mL | INTRAMUSCULAR | Status: DC
  Filled 2016-07-17: qty 20

## 2016-07-17 MED ORDER — LIDOCAINE HCL (CARDIAC) 20 MG/ML IV SOLN
INTRAVENOUS | Status: DC | PRN
  Administered 2016-07-17: 20 mg via INTRAVENOUS

## 2016-07-17 MED ORDER — BUPIVACAINE LIPOSOME 1.3 % IJ SUSP
INTRAMUSCULAR | Status: DC | PRN
  Administered 2016-07-17: 20 mL

## 2016-07-17 MED ORDER — SODIUM CHLORIDE 0.9 % IR SOLN
Status: DC | PRN
  Administered 2016-07-17: 17:00:00

## 2016-07-17 MED ORDER — THROMBIN 20000 UNITS EX SOLR
CUTANEOUS | Status: DC | PRN
  Administered 2016-07-17: 17:00:00 via TOPICAL

## 2016-07-17 MED ORDER — DEXTROSE 5 % IV SOLN
3.0000 g | INTRAVENOUS | Status: DC | PRN
  Administered 2016-07-17: 2 g via INTRAVENOUS
  Administered 2016-07-17: 500 g via INTRAVENOUS

## 2016-07-17 MED ORDER — MIDAZOLAM HCL 5 MG/5ML IJ SOLN
INTRAMUSCULAR | Status: DC | PRN
  Administered 2016-07-17: 1 mg via INTRAVENOUS

## 2016-07-17 MED ORDER — VANCOMYCIN HCL 1000 MG IV SOLR
INTRAVENOUS | Status: DC | PRN
  Administered 2016-07-17: 1000 mg via TOPICAL

## 2016-07-17 MED ORDER — FENTANYL CITRATE (PF) 250 MCG/5ML IJ SOLN
INTRAMUSCULAR | Status: AC
  Filled 2016-07-17: qty 5

## 2016-07-17 MED ORDER — LACTATED RINGERS IV SOLN
INTRAVENOUS | Status: DC | PRN
  Administered 2016-07-17: 16:00:00 via INTRAVENOUS

## 2016-07-17 MED ORDER — DEXAMETHASONE SODIUM PHOSPHATE 4 MG/ML IJ SOLN: 4.0000 mg | Freq: Four times a day (QID) | INTRAMUSCULAR | Status: DC

## 2016-07-17 MED ORDER — 0.9 % SODIUM CHLORIDE (POUR BTL) OPTIME
TOPICAL | Status: DC | PRN
  Administered 2016-07-17: 1000 mL

## 2016-07-17 MED ORDER — CEFAZOLIN SODIUM-DEXTROSE 2-4 GM/100ML-% IV SOLN
INTRAVENOUS | Status: AC
  Filled 2016-07-17: qty 100

## 2016-07-17 MED ORDER — MENTHOL 3 MG MT LOZG: 1.0000 | LOZENGE | OROMUCOSAL | Status: DC | PRN

## 2016-07-17 MED ORDER — CYCLOBENZAPRINE HCL 10 MG PO TABS
10.0000 mg | ORAL_TABLET | Freq: Three times a day (TID) | ORAL | Status: DC | PRN
  Administered 2016-07-17 – 2016-07-20 (×4): 10 mg via ORAL
  Filled 2016-07-17 (×2): qty 1

## 2016-07-17 MED ORDER — PHENYLEPHRINE HCL 10 MG/ML IJ SOLN
10.0000 mg | INTRAVENOUS | Status: DC | PRN
  Administered 2016-07-17: 20 ug/min via INTRAVENOUS

## 2016-07-17 MED ORDER — ROCURONIUM BROMIDE 100 MG/10ML IV SOLN
INTRAVENOUS | Status: DC | PRN
  Administered 2016-07-17: 20 mg via INTRAVENOUS
  Administered 2016-07-17: 50 mg via INTRAVENOUS

## 2016-07-17 MED ORDER — CYCLOBENZAPRINE HCL 10 MG PO TABS
ORAL_TABLET | ORAL | Status: AC
  Filled 2016-07-17: qty 1

## 2016-07-17 MED ORDER — PROPOFOL 10 MG/ML IV BOLUS
INTRAVENOUS | Status: DC | PRN
  Administered 2016-07-17: 200 mg via INTRAVENOUS

## 2016-07-17 MED ORDER — CEFAZOLIN IN D5W 1 GM/50ML IV SOLN
INTRAVENOUS | Status: AC
  Filled 2016-07-17: qty 50

## 2016-07-17 MED ORDER — HYDROMORPHONE HCL 1 MG/ML IJ SOLN
INTRAMUSCULAR | Status: AC
  Filled 2016-07-17: qty 1

## 2016-07-17 MED ORDER — FENTANYL CITRATE (PF) 100 MCG/2ML IJ SOLN
INTRAMUSCULAR | Status: DC | PRN
  Administered 2016-07-17: 250 ug via INTRAVENOUS

## 2016-07-17 MED ORDER — MIDAZOLAM HCL 2 MG/2ML IJ SOLN
INTRAMUSCULAR | Status: AC
  Filled 2016-07-17: qty 2

## 2016-07-17 MED ORDER — MEPERIDINE HCL 25 MG/ML IJ SOLN: 6.2500 mg | INTRAMUSCULAR | Status: DC | PRN

## 2016-07-17 MED ORDER — DEXAMETHASONE 4 MG PO TABS: 4.0000 mg | ORAL_TABLET | Freq: Four times a day (QID) | ORAL | Status: DC

## 2016-07-17 MED ORDER — OXYCODONE-ACETAMINOPHEN 5-325 MG PO TABS
1.0000 | ORAL_TABLET | ORAL | Status: DC | PRN
  Administered 2016-07-18: 2 via ORAL
  Administered 2016-07-18: 1 via ORAL
  Administered 2016-07-18 – 2016-07-19 (×5): 2 via ORAL
  Filled 2016-07-17: qty 2
  Filled 2016-07-17: qty 1
  Filled 2016-07-17 (×2): qty 2
  Filled 2016-07-17: qty 1
  Filled 2016-07-17 (×3): qty 2

## 2016-07-17 MED ORDER — SODIUM CHLORIDE 0.9% FLUSH
3.0000 mL | Freq: Two times a day (BID) | INTRAVENOUS | Status: DC
  Administered 2016-07-17 – 2016-07-20 (×6): 3 mL via INTRAVENOUS

## 2016-07-17 MED ORDER — ACETAMINOPHEN 650 MG RE SUPP: 650.0000 mg | RECTAL | Status: DC | PRN

## 2016-07-17 MED ORDER — VANCOMYCIN HCL 1000 MG IV SOLR
INTRAVENOUS | Status: AC
  Filled 2016-07-17: qty 1000

## 2016-07-17 MED ORDER — ONDANSETRON HCL 4 MG/2ML IJ SOLN: 4.0000 mg | INTRAMUSCULAR | Status: DC | PRN

## 2016-07-17 MED ORDER — SUGAMMADEX SODIUM 200 MG/2ML IV SOLN
INTRAVENOUS | Status: DC | PRN
  Administered 2016-07-17: 200 mg via INTRAVENOUS

## 2016-07-17 MED ORDER — ALBUMIN HUMAN 5 % IV SOLN
INTRAVENOUS | Status: DC | PRN
  Administered 2016-07-17 (×2): via INTRAVENOUS

## 2016-07-17 MED ORDER — MIDAZOLAM HCL 2 MG/2ML IJ SOLN
0.5000 mg | Freq: Once | INTRAMUSCULAR | Status: AC | PRN
  Administered 2016-07-17: 0.5 mg via INTRAVENOUS

## 2016-07-17 MED ORDER — OXYCODONE HCL 5 MG PO TABS
ORAL_TABLET | ORAL | Status: AC
  Filled 2016-07-17: qty 1

## 2016-07-17 MED ORDER — CEFAZOLIN SODIUM-DEXTROSE 2-4 GM/100ML-% IV SOLN
2.0000 g | Freq: Three times a day (TID) | INTRAVENOUS | Status: AC
  Administered 2016-07-17 – 2016-07-19 (×6): 2 g via INTRAVENOUS
  Filled 2016-07-17 (×6): qty 100

## 2016-07-17 MED ORDER — PROPOFOL 10 MG/ML IV BOLUS
INTRAVENOUS | Status: AC
  Filled 2016-07-17: qty 20

## 2016-07-17 MED ORDER — THROMBIN 5000 UNITS EX SOLR
CUTANEOUS | Status: DC | PRN
  Administered 2016-07-17: 17:00:00 via TOPICAL

## 2016-07-17 MED ORDER — HYDROMORPHONE HCL 1 MG/ML IJ SOLN
0.5000 mg | INTRAMUSCULAR | Status: DC | PRN
  Administered 2016-07-17: 1 mg via INTRAVENOUS
  Filled 2016-07-17: qty 1

## 2016-07-17 MED ORDER — SODIUM CHLORIDE 0.9% FLUSH: 3.0000 mL | INTRAVENOUS | Status: DC | PRN

## 2016-07-17 MED ORDER — SODIUM CHLORIDE 0.9 % IV SOLN: 250.0000 mL | INTRAVENOUS | Status: DC

## 2016-07-17 MED ORDER — HYDROMORPHONE HCL 1 MG/ML IJ SOLN
0.2500 mg | INTRAMUSCULAR | Status: DC | PRN
  Administered 2016-07-17 (×4): 0.5 mg via INTRAVENOUS

## 2016-07-17 MED ORDER — ETOMIDATE 2 MG/ML IV SOLN
INTRAVENOUS | Status: AC
  Filled 2016-07-17: qty 10

## 2016-07-17 MED ORDER — ACETAMINOPHEN 325 MG PO TABS: 650.0000 mg | ORAL_TABLET | ORAL | Status: DC | PRN

## 2016-07-17 MED ORDER — PHENOL 1.4 % MT LIQD
1.0000 | OROMUCOSAL | Status: DC | PRN
Start: 2016-07-17 — End: 2016-07-20

## 2016-07-17 MED ORDER — PROMETHAZINE HCL 25 MG/ML IJ SOLN: 6.2500 mg | INTRAMUSCULAR | Status: DC | PRN

## 2016-07-17 SURGICAL SUPPLY — 74 items
BAG DECANTER FOR FLEXI CONT (MISCELLANEOUS) ×3 IMPLANT
BENZOIN TINCTURE PRP APPL 2/3 (GAUZE/BANDAGES/DRESSINGS) ×3 IMPLANT
BLADE CLIPPER SURG (BLADE) IMPLANT
BLADE SURG 11 STRL SS (BLADE) ×3 IMPLANT
BRUSH SCRUB EZ PLAIN DRY (MISCELLANEOUS) ×3 IMPLANT
BUR MATCHSTICK NEURO 3.0 LAGG (BURR) ×3 IMPLANT
BUR PRECISION FLUTE 6.0 (BURR) ×3 IMPLANT
CAGE RISE 11-17-15 10X26 (Cage) ×6 IMPLANT
CANISTER SUCT 3000ML PPV (MISCELLANEOUS) ×3 IMPLANT
CAP LOCKING THREADED (Cap) ×18 IMPLANT
CLOSURE WOUND 1/2 X4 (GAUZE/BANDAGES/DRESSINGS) ×2
CONT SPEC 4OZ CLIKSEAL STRL BL (MISCELLANEOUS) ×3 IMPLANT
COVER BACK TABLE 60X90IN (DRAPES) ×3 IMPLANT
DECANTER SPIKE VIAL GLASS SM (MISCELLANEOUS) IMPLANT
DRAPE C-ARM 42X72 X-RAY (DRAPES) ×9 IMPLANT
DRAPE C-ARMOR (DRAPES) ×3 IMPLANT
DRAPE LAPAROTOMY 100X72X124 (DRAPES) ×3 IMPLANT
DRAPE POUCH INSTRU U-SHP 10X18 (DRAPES) ×3 IMPLANT
DRAPE PROXIMA HALF (DRAPES) ×6 IMPLANT
DRAPE SURG 17X23 STRL (DRAPES) ×3 IMPLANT
DRSG OPSITE 4X5.5 SM (GAUZE/BANDAGES/DRESSINGS) ×3 IMPLANT
DRSG OPSITE POSTOP 3X4 (GAUZE/BANDAGES/DRESSINGS) ×3 IMPLANT
DURAPREP 26ML APPLICATOR (WOUND CARE) ×3 IMPLANT
ELECT REM PT RETURN 9FT ADLT (ELECTROSURGICAL) ×3
ELECTRODE REM PT RTRN 9FT ADLT (ELECTROSURGICAL) ×1 IMPLANT
EVACUATOR 3/16  PVC DRAIN (DRAIN) ×2
EVACUATOR 3/16 PVC DRAIN (DRAIN) ×1 IMPLANT
GAUZE SPONGE 4X4 12PLY STRL (GAUZE/BANDAGES/DRESSINGS) ×3 IMPLANT
GAUZE SPONGE 4X4 16PLY XRAY LF (GAUZE/BANDAGES/DRESSINGS) IMPLANT
GLOVE BIO SURGEON STRL SZ 6.5 (GLOVE) ×6 IMPLANT
GLOVE BIO SURGEON STRL SZ8 (GLOVE) ×9 IMPLANT
GLOVE BIO SURGEONS STRL SZ 6.5 (GLOVE) ×3
GLOVE BIOGEL PI IND STRL 7.0 (GLOVE) ×2 IMPLANT
GLOVE BIOGEL PI IND STRL 7.5 (GLOVE) ×2 IMPLANT
GLOVE BIOGEL PI INDICATOR 7.0 (GLOVE) ×4
GLOVE BIOGEL PI INDICATOR 7.5 (GLOVE) ×4
GLOVE INDICATOR 8.5 STRL (GLOVE) ×9 IMPLANT
GOWN STRL REUS W/ TWL LRG LVL3 (GOWN DISPOSABLE) ×1 IMPLANT
GOWN STRL REUS W/ TWL XL LVL3 (GOWN DISPOSABLE) ×2 IMPLANT
GOWN STRL REUS W/TWL 2XL LVL3 (GOWN DISPOSABLE) IMPLANT
GOWN STRL REUS W/TWL LRG LVL3 (GOWN DISPOSABLE) ×2
GOWN STRL REUS W/TWL XL LVL3 (GOWN DISPOSABLE) ×4
GRAFT BN 10X1XDBM MAGNIFUSE (Bone Implant) ×1 IMPLANT
GRAFT BONE MAGNIFUSE 1X10CM (Bone Implant) ×2 IMPLANT
HEMOSTAT POWDER KIT SURGIFOAM (HEMOSTASIS) ×3 IMPLANT
KIT BASIN OR (CUSTOM PROCEDURE TRAY) ×3 IMPLANT
KIT INFUSE SMALL (Orthopedic Implant) ×3 IMPLANT
KIT ROOM TURNOVER OR (KITS) ×3 IMPLANT
LIQUID BAND (GAUZE/BANDAGES/DRESSINGS) ×3 IMPLANT
NEEDLE HYPO 21X1.5 SAFETY (NEEDLE) ×3 IMPLANT
NEEDLE HYPO 25X1 1.5 SAFETY (NEEDLE) ×3 IMPLANT
NS IRRIG 1000ML POUR BTL (IV SOLUTION) ×3 IMPLANT
PACK FOAM VITOSS 10CC (Orthopedic Implant) ×6 IMPLANT
PACK LAMINECTOMY NEURO (CUSTOM PROCEDURE TRAY) ×3 IMPLANT
PAD ARMBOARD 7.5X6 YLW CONV (MISCELLANEOUS) ×9 IMPLANT
PUTTY BONE DBX 5CC MIX (Putty) ×3 IMPLANT
ROD 65MM SPINAL (Rod) ×4 IMPLANT
ROD SPNL 5.5 CREO TI 65 (Rod) ×2 IMPLANT
SCREW AMP MODULAR CREO 6.5X45 (Screw) ×12 IMPLANT
SCREW PA THRD CREO TULIP 5.5X4 (Head) ×18 IMPLANT
SCREW SPINE CREO AMP 6.5X50 (Screw) ×6 IMPLANT
SPACER CROSSLINK 58-70 (Spacer) ×3 IMPLANT
SPONGE LAP 4X18 X RAY DECT (DISPOSABLE) IMPLANT
SPONGE SURGIFOAM ABS GEL 100 (HEMOSTASIS) ×3 IMPLANT
STRIP CLOSURE SKIN 1/2X4 (GAUZE/BANDAGES/DRESSINGS) ×4 IMPLANT
SUT VIC AB 0 CT1 18XCR BRD8 (SUTURE) ×1 IMPLANT
SUT VIC AB 0 CT1 8-18 (SUTURE) ×2
SUT VIC AB 2-0 CT1 18 (SUTURE) ×3 IMPLANT
SUT VIC AB 4-0 PS2 27 (SUTURE) ×3 IMPLANT
SYR 20CC LL (SYRINGE) ×3 IMPLANT
TOWEL OR 17X24 6PK STRL BLUE (TOWEL DISPOSABLE) ×3 IMPLANT
TOWEL OR 17X26 10 PK STRL BLUE (TOWEL DISPOSABLE) ×3 IMPLANT
TRAY FOLEY W/METER SILVER 16FR (SET/KITS/TRAYS/PACK) IMPLANT
WATER STERILE IRR 1000ML POUR (IV SOLUTION) ×3 IMPLANT

## 2016-07-17 NOTE — Anesthesia Preprocedure Evaluation (Addendum)
Anesthesia Evaluation  Patient identified by MRN, date of birth, ID band Patient awake    Reviewed: Allergy & Precautions, NPO status , Patient's Chart, lab work & pertinent test results  History of Anesthesia Complications (+) PONV and history of anesthetic complications (episode of acute hypotension: ? air embolism?)  Airway Mallampati: III  TM Distance: <3 FB Neck ROM: Full    Dental  (+) Dental Advisory Given, Caps   Pulmonary sleep apnea and Continuous Positive Airway Pressure Ventilation , COPD, former smoker,    breath sounds clear to auscultation       Cardiovascular hypertension, Pt. on medications (-) angina Rhythm:Regular Rate:Normal  7/19 ECHO:  EF 65-70%, valves OK   Neuro/Psych  Headaches, Anxiety Depression Chronic back pain    GI/Hepatic negative GI ROS, Neg liver ROS,   Endo/Other  negative endocrine ROS  Renal/GU negative Renal ROS   Bladder cancer    Musculoskeletal  (+) Arthritis ,   Abdominal   Peds  Hematology  (+) Blood dyscrasia (Hb 10.1), ,   Anesthesia Other Findings   Reproductive/Obstetrics                            Anesthesia Physical Anesthesia Plan  ASA: III  Anesthesia Plan: General   Post-op Pain Management:    Induction: Intravenous  Airway Management Planned: Oral ETT and Video Laryngoscope Planned  Additional Equipment: Arterial line  Intra-op Plan:   Post-operative Plan: Extubation in OR  Informed Consent: I have reviewed the patients History and Physical, chart, labs and discussed the procedure including the risks, benefits and alternatives for the proposed anesthesia with the patient or authorized representative who has indicated his/her understanding and acceptance.   Dental advisory given  Plan Discussed with: CRNA and Surgeon  Anesthesia Plan Comments: (Plan routine monitors, existing A line, GETA )        Anesthesia Quick  Evaluation

## 2016-07-17 NOTE — Op Note (Signed)
Preoperative diagnosis: Lumbar spinal stenosis degenerative disc disease and herniated nucleus pulposus L3-4 L4-5  Postoperative diagnosis: Same.   Procedure: Reexploration of lumbar wound for completion of procedure that was aborted 2 days ago with placement of interbody cages utilizing the globus rise expandable cage system and pedicle screw fixation from L3-L5 utilizing the globus Creole amp modular pedicle screw set.  #2 posterior lateral arthrodesis L3 L5 using locally harvested autograft mixed with Magnafuse BMP and Vitoss  Surgeon: Dominica Severin Kamica Florance  Assistant: Marland Kitchen ditty  Anesthesia: Gen.  EBL: No  History of present illness: Patient is a 70 year old woman is a long-standing back and leg pain he went to the OR 2 days ago for decompression stabilization procedure but the case had to be aborted secondary to a severe hypotensive episode. Patient was stable as etiology was felt to be either fat embolus or relative hypo-hypovolemia and patient was cleared by critical care medicine to return to the OR for completion of his procedure. I extensively went over the risks and benefits of the operation the patient as well as perioperative course expectations of outcome and alternatives of surgery and he understood and agreed to proceed forward.  Operative procedure: Patient brought into the or was induced under general anesthesia positioned prone on the [facility] table his back was prepped and draped in routine sterile fashion the skin sutures were cut exposing the decompression then working in the contralateral disc space the disc cleaned out from the contralateral side endplates were scraped and prepared with paddle shavers and downgoing curettes. V Todd BMP was then packed centrally and contralateral cage were inserted and expanded a similar fashion to the contralateral side. Fluoroscopy confirmed good position of all the implants and cages. Attentions intake and taken to pedicle to placement using a  high-speed drill pilot holes were drilled pedicles were cannulated probed tapped probed again and 6 5 x 50 screws were inserted bilaterally at L3 6 5 x 45's bilaterally at L4 and L5. All screws excellent purchase. Postop fluoroscopy confirmed good position of all the implants. The wounds and copious irrigated fixing space was maintained aggressive decortication was care MTPs or lateral gutters the BMP V toss and magnafuse with an packed posterior laterally along with some locally harvested autograft from the facet joints. Then heads were assembled on the [place] screws rods were then fashioned and placed top tightening nuts were anchored in place and a cross-link. Prior to crossing placement all neural foraminal reinspected confirm patency. Then Gelfoam was opened up the dura a medium to large Hemovac drain was placed and the wounds closed in layers after sprinkling vancomycin powder and injecting X Perella in the fascia. Wounds closed with interrupted Vicryl and a running 4 subcuticular Dermabond benzo and Steri-Strips and sterile dressing was applied. At the end of case all needle counts and sponge counts were correct.

## 2016-07-17 NOTE — Anesthesia Postprocedure Evaluation (Signed)
Anesthesia Post Note  Patient: Zachary [name]  Procedure(s) Performed: Procedure(s) (LRB): Implantation of Cages and Pedicle Screws Lumbar Four-Five, Lumbar Five- Sacral One (N/A)  Patient location during evaluation: PACU Anesthesia Type: General Level of consciousness: awake Pain management: pain level controlled Vital Signs Assessment: post-procedure vital signs reviewed and stable Respiratory status: spontaneous breathing Cardiovascular status: stable Postop Assessment: no signs of nausea or vomiting Anesthetic complications: no    Last Vitals:  Filed Vitals:   07/17/16 1945 07/17/16 2015  BP: 155/84 163/83  Pulse: 67   Temp:    Resp: 20 20    Last Pain:  Filed Vitals:   07/17/16 2015  PainSc: 9                  Pola Furno

## 2016-07-17 NOTE — Progress Notes (Signed)
PULMONARY / CRITICAL CARE MEDICINE   Name: Zachary [name] MRN: [id number] DOB: [birth year]    ADMISSION DATE:  07/15/2016   CONSULTATION DATE: 07/15/2016  REFERRING MD:  Dr Saintclair Halsted  CHIEF COMPLAINT: hypotension during surgery  HISTORY OF PRESENT ILLNESS:   70 yo male with PMH of HTN, pre-diabetes, OSA with CPAP, bladder cancer,BPH, post-op nausea and vomiting, s/p transurethral resection of bladder tumor 07/27/15, Anterior cervical discectomy 06/05/13 and cataract extraction in 01/09/13 and 12/19/12. PCCM was consulted for evaluation of hypotension during surgery. In brief patient was having scheduled decompressive lumbar laminectomy with complete medial facetectomies and radical foraminotomies at L3-4 and L4-5 and posterior lumbar interbody fusion L3-4 and L4-5. Anesthesia began at [phone] and was unremarkable until 1205 when his BP dropped suddenly to the [id number] systolic after cellsaver infusion. Neo-synephrine was given without much response. He was also given 2 liters crystalloid and 500 ml of Albumin, vasopressin, epinephrine, benadryl and hydrocortisone. H/H was acceptable at 11.9/35. CT chest, abd pelvis did not indicate any acute bleeding. A CTA was negative for PE. He was then transferred intubated to 15M for further evaluation.    SUBJECTIVE:    Stable post extubation.. Slightly confused today AM  VITAL SIGNS: BP 150/77 mmHg  Pulse 62  Temp(Src) 97.7 F (36.5 C) (Axillary)  Resp 16  Wt 224 lb (101.606 kg)  SpO2 94%  HEMODYNAMICS:    VENTILATOR SETTINGS:    INTAKE / OUTPUT: I/O last 3 completed shifts: In: 4307.5 [P.O.:680; I.V.:3177.5; IV Piggyback:450] Out: [id number] [Urine:5800; Drains:505]  PHYSICAL EXAMINATION: General: Elderly gentleman, No distress Neuro: Awake, Follow commands. No focal deficits. HEENT: Moist mucous membranes, PERRL,  Cardiovascular: S1S2, no MRG Lungs: Clear, no wheeze or crackles Abdomen: +BS Musculoskeletal: Normal tone and bulk.  Skin: No rash on  exposed areas  LABS:  BMET  Recent Labs Lab 07/15/16 1527 07/16/16 0400 07/17/16 0605  NA 137 138 141  K 3.1* 4.1 4.2  CL 105 106 107  CO2 27 26 28   BUN 9 8 15   CREATININE 1.15 1.03 1.08  GLUCOSE 177* 138* 150*    Electrolytes  Recent Labs Lab 07/15/16 1527 07/16/16 0400 07/17/16 0605  CALCIUM 7.8* 8.1* 8.1*  MG 1.3* 1.4*  --   PHOS 3.1 2.6  --     CBC  Recent Labs Lab 07/15/16 1527 07/16/16 0400 07/17/16 0605  WBC 20.6* 17.2* 18.2*  HGB 11.1* 10.8* 10.1*  HCT 34.1* 32.7* 30.6*  PLT 164 153 179    Coag's No results for input(s): APTT, INR in the last 168 hours.  Sepsis Markers No results for input(s): LATICACIDVEN, PROCALCITON, O2SATVEN in the last 168 hours.  ABG  Recent Labs Lab 07/15/16 1239 07/15/16 1615 07/16/16 0342  PHART 7.281* 7.415 7.420  PCO2ART 54.7* 40.5 41.4  PO2ART 124.0* 105* 95.8    Liver Enzymes  Recent Labs Lab 07/16/16 0400  ALBUMIN 3.0*    Cardiac Enzymes  Recent Labs Lab 07/15/16 1527 07/15/16 2155 07/16/16 0400  TROPONINI <0.03 <0.03 <0.03    Glucose No results for input(s): GLUCAP in the last 168 hours.  Imaging No results found.   STUDIES:  CTA Chest/Abd/Pelvis 7/19: Negative for bleed, PE or dissection. TTE 7/19:  EF [id number], grade 1 diastolic dysfunction, trivial MR, mildly dilated RA, PA peak press 35mmHg. No regional wall motion abnormalities.  Port CXR 7/20: Persistent bibasilar opacities  MICROBIOLOGY: MRSA PCR 7/11:  Negative   ANTIBIOTICS: Ancef Peri-op  SIGNIFICANT EVENTS: 7/19 - surgery aborted 2/2 hypotensive event.  PCCM consulted for evaluation 7/20 - extubated  LINES/TUBES: OETT 7.5 7/19 - 7/20 Back Drain 7/19>>> R rad aline 7/19>>> PIV x3  DISCUSSION: 70 yo male with PMH of HTN, pre-diabetes, OSA with CPAP, bladder cancer, post-op nausea and vomiting. PCCM was consulted for evaluation of hypotension during lumbar surgery which was aborted.   ASSESSMENT /  PLAN:  PULMONARY A: Acute Hypercarbic Respiratory Failure - Mild. Resolved. Intubation - Periop. Now Extubated. Dependent Lung Opacities  P:   Wean down O2 as tolerated CPAP at night  CARDIOVASCULAR A:  Severe Hypotension intra-op- Resolved. Unclear etiology.  Differential diagnosis includes air embolism versus anaphylactic shock. Trop I neg. Echo normal H/O Hypertension Tachycardia - resolved Right BBB  P:  Continuous Telemetry Monitoring Vitals per unit protocol Holding home Diovan in the setting of hypotension.  RENAL A:   Hypomagnesemia - Replaced. Hypokalemia - Resolved.  P:   Monitor I&O Replete lytes  GASTROINTESTINAL A:   NPO  P:   NPO Pepcid 20 mg IV daily  HEMATOLOGIC A:   Anemia - Secondary to acute blood loss - 14.9 > 11.9. No signs of active bleeding. Leukocytosis - Likely stress response. Improving.  P:  Trending cell counts w/ CBC Transfuse for hgb < 7.0 SCD for DVT prophylaxis Holding chemoprophylaxis in the setting of surgery  INFECTIOUS A:   No acute issues  P:   Ancef intra-op No indication for further abx Follow fever curve Tylenol for temp > 101.5  ENDOCRINE A:   H/O Pre-Diabetes  P:   CBG Q4 hours  Can add SSI if needed On steroids as per neurosurgery.  NEUROLOGIC A:   Unstable L3-L5 - surgery aborted 1/2 way thru. Post-Op Pain Control  P:   PRN fent, versed  To OR today at 4 pm for completion of surgery May have HOB elevated per neurosurgery and log roll - no other mobility until spine is stabilized.  Continue neuro exams   FAMILY  - Updates: Wife updated at bedside 7/20. No family at bedside 7/21. - Inter-disciplinary family meet or Palliative Care meeting due by: 07/22/16  Marshell Garfinkel MD [facility] Pager [phone] If no answer or after 3pm call: [phone] 07/17/2016, 9:40 AM

## 2016-07-17 NOTE — Care Management Important Message (Signed)
Important Message  Patient Details  Name: Zachary [name] MRN: [id number] Date of Birth: Sep 04, 1946   Medicare Important Message Given:  Yes    Loann Quill 07/17/2016, 10:20 AM

## 2016-07-17 NOTE — Progress Notes (Signed)
Patient ID: Zachary [name], male   DOB: [birth year], 70 y.o.   MRN: [id number] Patient doing well with minimal to no pain back or legs. Little bit of confusion and delirium overnight  Strength5 out of 5 wound clean dry and intact  2 OR later today for finishing up his abortive procedure with implantation of cages and screws rods. I've extensively gone over the risks and benefits of the procedure the patient as well as perioperative course expectations of outcome and alternatives of surgery he understands and agrees to proceed forward.

## 2016-07-17 NOTE — Anesthesia Procedure Notes (Signed)
Procedure Name: Intubation Date/Time: 07/17/2016 4:21 PM Performed by: Izora Gala Pre-anesthesia Checklist: Patient identified, Emergency Drugs available, Suction available and Patient being monitored Patient Re-evaluated:Patient Re-evaluated prior to inductionOxygen Delivery Method: Circle system utilized Preoxygenation: Pre-oxygenation with 100% oxygen Intubation Type: IV induction Ventilation: Mask ventilation without difficulty Laryngoscope Size: Glidescope (Elective Glidescope) Grade View: Grade I Tube type: Subglottic suction tube Tube size: 7.5 mm Number of attempts: 1 Placement Confirmation: ETT inserted through vocal cords under direct vision,  positive ETCO2 and breath sounds checked- equal and bilateral Secured at: 23 cm Tube secured with: Tape Dental Injury: Teeth and Oropharynx as per pre-operative assessment

## 2016-07-17 NOTE — Transfer of Care (Signed)
Immediate Anesthesia Transfer of Care Note  Patient: Zachary [name]  Procedure(s) Performed: Procedure(s): Implantation of Cages and Pedicle Screws Lumbar Four-Five, Lumbar Five- Sacral One (N/A)  Patient Location: PACU  Anesthesia Type:General  Level of Consciousness: awake, alert , oriented and patient cooperative  Airway & Oxygen Therapy: Patient Spontanous Breathing and Patient connected to nasal cannula oxygen  Post-op Assessment: Report given to RN, Post -op Vital signs reviewed and stable and Patient moving all extremities  Post vital signs: Reviewed and stable  Last Vitals:  Filed Vitals:   07/17/16 1400 07/17/16 1500  BP: 161/81 156/79  Pulse: 61 59  Temp:    Resp: 19 17    Last Pain:  Filed Vitals:   07/17/16 1550  PainSc: 1          Complications: No apparent anesthesia complications

## 2016-07-17 NOTE — Progress Notes (Signed)
   07/17/16 2250  BiPAP/CPAP/SIPAP  BiPAP/CPAP/SIPAP Pt Type Adult  Mask Type Full face mask  Set Rate 0 breaths/min  Respiratory Rate 15 breaths/min  BiPAP/CPAP/SIPAP CPAP  Patient Home Equipment Yes  Auto Titrate Yes  BiPAP/CPAP /SiPAP Vitals  Pulse Rate 70  Resp 15  Patient placed on CPAP home unit, Auto Mode. He tolerates it very well at this time. He promised to call RT if he has any problem with the machine.

## 2016-07-18 MED ORDER — DOCUSATE SODIUM 100 MG PO CAPS
100.0000 mg | ORAL_CAPSULE | Freq: Two times a day (BID) | ORAL | Status: DC
  Administered 2016-07-18 – 2016-07-20 (×4): 100 mg via ORAL
  Filled 2016-07-18 (×4): qty 1

## 2016-07-18 MED ORDER — FAMOTIDINE 20 MG PO TABS
20.0000 mg | ORAL_TABLET | Freq: Two times a day (BID) | ORAL | Status: DC
  Administered 2016-07-18 – 2016-07-20 (×4): 20 mg via ORAL
  Filled 2016-07-18 (×4): qty 1

## 2016-07-18 MED ORDER — POLYETHYLENE GLYCOL 3350 17 G PO PACK
17.0000 g | PACK | Freq: Every day | ORAL | Status: DC | PRN
  Administered 2016-07-18: 17 g via ORAL
  Filled 2016-07-18 (×2): qty 1

## 2016-07-18 NOTE — Progress Notes (Signed)
Orthopedic Tech Progress Note Patient Details:  Zachary [name] [birth year] [id number]  Patient ID: Zachary [name], male   DOB: June 13, 1946, 70 y.o.   MRN: [id number] Called on bio-tech brace order; spoke with Dillon Bjork, Theus Espin 07/18/2016, 11:40 AM

## 2016-07-18 NOTE — Progress Notes (Signed)
Ortho called, requested lumbar corseted.

## 2016-07-18 NOTE — Progress Notes (Signed)
No acute events Moving legs well Incision looks good Up when brace arrives

## 2016-07-18 NOTE — Progress Notes (Signed)
Physical Therapy Evaluation Patient Details Name: Zachary [name] MRN: [id number] DOB: [birth year] Today's Date: 07/18/2016   History of Present Illness  70 y.o. male s/p L3-4 and L4-5 PLIF. PMH significant for HTN, prediabetes, joint pain, bladder cancer, anxiety, cluster headaches, and arthritis.  Clinical Impression  Patient agreeable to therapy, painful.  Patient is detail oriented about back brace, spine precautions, and proper sequencing.  Educated patient away from 'no pain, no gain' mentality regarding spine surgery recovery.  MOD assist bed mobility, MIN assist transfers with additional cues for technique, MIN guard gait short distance due to feeling of fatigue.  Patient is good candidate for rehab, wishes to go to [facility] for rehab.  Will remain on skilled PT services while in hospital.     Follow Up Recommendations SNF    Equipment Recommendations  None recommended by PT    Recommendations for Other Services       Precautions / Restrictions Precautions Precautions: Back;Fall Precaution Booklet Issued: Yes (comment) Precaution Comments: Reviewed all back precautions and pt able to recall 2/3 precautions at end of session Required Braces or Orthoses: Spinal Brace Spinal Brace: Lumbar corset;Applied in sitting position Restrictions Weight Bearing Restrictions: No      Mobility  Bed Mobility Overal bed mobility: Needs Assistance Bed Mobility: Sit to Supine     Supine to sit: Min assist Sit to supine: Mod assist   General bed mobility comments: Mod assist for technique cues and LE support lifted into bed.  Transfers Overall transfer level: Needs assistance Equipment used: Rolling walker (2 wheeled) Transfers: Sit to/from Stand Sit to Stand: Min assist         General transfer comment: Cues for setup and scooting forward, good after cues provided.   Ambulation/Gait Ambulation/Gait assistance: Min guard Ambulation Distance (Feet): 65 Feet Assistive device:  Rolling walker (2 wheeled) Gait Pattern/deviations: Decreased stride length;Antalgic   Gait velocity interpretation: Below normal speed for age/gender General Gait Details: Short steps, very slow pace, cautious   Stairs            Wheelchair Mobility    Modified Rankin (Stroke Patients Only)       Balance Overall balance assessment: Needs assistance Sitting-balance support: No upper extremity supported;Feet supported Sitting balance-Leahy Scale: Good     Standing balance support: Bilateral upper extremity supported;During functional activity Standing balance-Leahy Scale: Poor Standing balance comment: Reliant on UE support to maintain balance                             Pertinent Vitals/Pain Pain Assessment: 0-10 Pain Score: 6  Pain Location: back Pain Descriptors / Indicators: Aching;Sore Pain Intervention(s): Monitored during session;Limited activity within patient's tolerance    Home Living Family/patient expects to be discharged to:: Skilled nursing facility [facility]) Living Arrangements: Alone                    Prior Function Level of Independence: Independent         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, Nurse, 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     Hand Dominance   Dominant Hand: Right    Extremity/Trunk Assessment   Upper Extremity Assessment: Overall [facility] for tasks assessed;Defer to OT evaluation           Lower Extremity Assessment: Generalized weakness      Cervical / Trunk Assessment: Other exceptions  Communication   Communication: No difficulties  Cognition Arousal/Alertness: Awake/alert Behavior During Therapy: WFL for tasks assessed/performed Overall Cognitive Status: Within Functional Limits for tasks assessed                      General Comments      Exercises        Assessment/Plan    PT Assessment Patient needs continued PT services   PT Diagnosis Difficulty walking;Generalized weakness;Acute pain   PT Problem List Decreased strength;Decreased activity tolerance;Decreased mobility;Decreased balance;Decreased knowledge of use of DME;Decreased knowledge of precautions;Pain  PT Treatment Interventions DME instruction;Gait training;Functional mobility training;Therapeutic activities;Therapeutic exercise;Patient/family education;Balance training   PT Goals (Current goals can be found in the Care Plan section) Acute Rehab PT Goals Patient Stated Goal: to go to [facility] and then get back to the football field PT Goal Formulation: With patient Time For Goal Achievement: 08/01/16 Potential to Achieve Goals: Good    Frequency Min 5X/week   Barriers to discharge        Co-evaluation               End of Session Equipment Utilized During Treatment: Gait belt;Back brace Activity Tolerance: Patient tolerated treatment well;No increased pain Patient left: in bed;with call bell/phone within reach;with SCD's reapplied Nurse Communication: Mobility status         Time: 1515-1610 PT Time Calculation (min) (ACUTE ONLY): 55 min   Charges:   PT Evaluation $PT Eval Moderate Complexity: 1 Procedure PT Treatments $Therapeutic Activity: 23-37 mins   PT G Codes:        Yassen Kinnett L 07/24/2016, 4:27 PM

## 2016-07-18 NOTE — Progress Notes (Signed)
Occupational Therapy Evaluation Patient Details Name: Zachary [name] MRN: [id number] DOB: 21-Jun-1946 Today's Date: 07/18/2016    History of Present Illness 70 y.o. male s/p L3-4 and L4-5 PLIF. PMH significant for HTN, prediabetes, joint pain, bladder cancer, anxiety, cluster headaches, and arthritis.   Clinical Impression   PTA, pt was independent with ADLs and mobility. Pt currently requires mod assist for LB ADLs and min assist for basic transfers. Educated pt on back precautions, brace wear protocol, and compensatory strategies for ADLs. Pt plans to d/c to SNF for post-acute rehab. Pt will benefit from continued acute OT to increase independence and safety with ADLs and mobility. Recommend SNF at this time.     Follow Up Recommendations  SNF;Supervision/Assistance - 24 hour    Equipment Recommendations  Other (comment) (TBD at next venue)    Recommendations for Other Services       Precautions / Restrictions Precautions Precautions: Back;Fall Precaution Booklet Issued: Yes (comment) Precaution Comments: Reviewed all back precautions and pt able to recall 2/3 precautions at end of session Required Braces or Orthoses: Spinal Brace Spinal Brace: Lumbar corset;Applied in sitting position Restrictions Weight Bearing Restrictions: No      Mobility Bed Mobility Overal bed mobility: Needs Assistance Bed Mobility: Supine to Sit     Supine to sit: Min assist     General bed mobility comments: Min assist to roll completely onto side and for trunk support to come to sitting position. VCs for log roll technique and hand placement.  Transfers Overall transfer level: Needs assistance Equipment used: Rolling walker (2 wheeled) Transfers: Sit to/from Stand Sit to Stand: Mod assist         General transfer comment: Initially, pt required mod assist for first sit-stand transfer and then min assist for all subsequent transfers. VCs for safe hand placement and proper RW use.     Balance Overall balance assessment: Needs assistance Sitting-balance support: No upper extremity supported;Feet supported Sitting balance-Leahy Scale: Good     Standing balance support: Bilateral upper extremity supported;During functional activity Standing balance-Leahy Scale: Poor Standing balance comment: Reliant on UE support to maintain balance                            ADL Overall ADL's : Needs assistance/impaired     Grooming: Wash/dry hands;Set up;Sitting   Upper Body Bathing: Set up;Sitting   Lower Body Bathing: Moderate assistance;Sit to/from stand   Upper Body Dressing : Set up;Sitting   Lower Body Dressing: Moderate assistance;Sit to/from stand   Toilet Transfer: Min guard;Cueing for safety;Ambulation;BSC;RW   Toileting- Water quality scientist and Hygiene: Min guard;Sit to/from stand       Functional mobility during ADLs: Min guard;Rolling walker General ADL Comments: Educated pt on back precautions, brace wear protocol and frequent position changes.     Vision Vision Assessment?: No apparent visual deficits   Perception     Praxis      Pertinent Vitals/Pain Pain Assessment: 0-10 Pain Score: 8  Pain Location: back Pain Descriptors / Indicators: Aching;Sore Pain Intervention(s): Monitored during session;Limited activity within patient's tolerance;Repositioned;RN gave pain meds during session     Hand Dominance Right   Extremity/Trunk Assessment Upper Extremity Assessment Upper Extremity Assessment: Overall WFL for tasks assessed   Lower Extremity Assessment Lower Extremity Assessment: Generalized weakness   Cervical / Trunk Assessment Cervical / Trunk Assessment: Other exceptions Cervical / Trunk Exceptions: s/p lumbar surgery   Communication Communication Communication: No difficulties  Cognition Arousal/Alertness: Awake/alert Behavior During Therapy: WFL for tasks assessed/performed Overall Cognitive Status: Within  Functional Limits for tasks assessed                     General Comments       Exercises       Shoulder Instructions      Home Living Family/patient expects to be discharged to:: Skilled nursing facility [facility]) Living Arrangements: Alone                                      Prior Functioning/Environment Level of Independence: Independent        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, Nurse, 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    OT Diagnosis: Generalized weakness;Acute pain   OT Problem List: Decreased strength;Decreased activity tolerance;Impaired balance (sitting and/or standing);Decreased safety awareness;Decreased knowledge of use of DME or AE;Decreased knowledge of precautions;Obesity;Pain   OT Treatment/Interventions: Self-care/ADL training;Therapeutic exercise;DME and/or AE instruction;Therapeutic activities;Balance training;Patient/family education    OT Goals(Current goals can be found in the care plan section) Acute Rehab OT Goals Patient Stated Goal: to go to [facility] and then get back to the football field OT Goal Formulation: With patient Time For Goal Achievement: 08/01/16 Potential to Achieve Goals: Good ADL Goals Pt Will Perform Grooming: with modified independence;standing Pt Will Perform Lower Body Bathing: with modified independence;with adaptive equipment;sit to/from stand Pt Will Perform Lower Body Dressing: with modified independence;sit to/from stand;with adaptive equipment Pt Will Transfer to Toilet: with modified independence;ambulating;bedside commode Pt Will Perform Toileting - Clothing Manipulation and hygiene: with modified independence;sit to/from stand  OT Frequency: Min 2X/week   Barriers to D/C: Decreased caregiver support  Lives alone       Co-evaluation              End of Session Equipment Utilized During Treatment: Gait belt;Back brace;Rolling  walker Nurse Communication: Mobility status  Activity Tolerance: Patient tolerated treatment well Patient left: in chair;with call bell/phone within reach;Other (comment) (no alarm box in room - chair alarm pad on chair)   Time: [id number] OT Time Calculation (min): 35 min Charges:  OT General Charges $OT Visit: 1 Procedure OT Evaluation $OT Eval Moderate Complexity: 1 Procedure OT Treatments $Self Care/Home Management : 8-22 mins G-Codes:    Redmond Baseman, OTR/L Pager: [phone] 07/18/2016, 3:19 PM

## 2016-07-19 MED ORDER — ALUM & MAG HYDROXIDE-SIMETH 200-200-20 MG/5ML PO SUSP
ORAL | Status: AC
  Administered 2016-07-19: 05:00:00
  Filled 2016-07-19: qty 30

## 2016-07-19 MED ORDER — ALUM & MAG HYDROXIDE-SIMETH 200-200-20 MG/5ML PO SUSP
30.0000 mL | Freq: Four times a day (QID) | ORAL | Status: DC | PRN
  Administered 2016-07-19: 30 mL via ORAL

## 2016-07-19 MED ORDER — FLEET ENEMA 7-19 GM/118ML RE ENEM
1.0000 | ENEMA | Freq: Once | RECTAL | Status: AC
  Administered 2016-07-19: 1 via RECTAL
  Filled 2016-07-19: qty 1

## 2016-07-19 NOTE — Progress Notes (Signed)
No acute events Back pain improving Moving legs well Stable Continue drain Dispo planning

## 2016-07-19 NOTE — Progress Notes (Signed)
PULMONARY / CRITICAL CARE MEDICINE   Name: Zachary [name] MRN: [id number] DOB: 1946/12/09    ADMISSION DATE:  07/15/2016   CONSULTATION DATE: 07/15/2016  REFERRING MD:  Dr Saintclair Halsted  CHIEF COMPLAINT: hypotension during surgery  HISTORY OF PRESENT ILLNESS:   70 yo male with PMH of HTN, pre-diabetes, OSA with CPAP, bladder cancer,BPH, post-op nausea and vomiting, s/p transurethral resection of bladder tumor 07/27/15, Anterior cervical discectomy 06/05/13 and cataract extraction in 01/09/13 and 12/19/12. PCCM was consulted for evaluation of hypotension during surgery. In brief patient was having scheduled decompressive lumbar laminectomy with complete medial facetectomies and radical foraminotomies at L3-4 and L4-5 and posterior lumbar interbody fusion L3-4 and L4-5. Anesthesia began at [phone] and was unremarkable until 1205 when his BP dropped suddenly to the [id number] systolic after cellsaver infusion. Neo-synephrine was given without much response. He was also given 2 liters crystalloid and 500 ml of Albumin, vasopressin, epinephrine, benadryl and hydrocortisone. H/H was acceptable at 11.9/35. CT chest, abd pelvis did not indicate any acute bleeding. A CTA was negative for PE. He was then transferred intubated to 53M for further evaluation.    SUBJECTIVE:    Alert approp/ using IS with IC nearly 2 liters   VITAL SIGNS: BP (!) 143/74 (BP Location: Left Arm)   Pulse 76   Temp 98 F (36.7 C) (Oral)   Resp 20   Ht 6' (1.829 m)   Wt 243 lb 12.8 oz (110.6 kg)   SpO2 95%   BMI 33.07 kg/m  FIO2 = RA   HEMODYNAMICS:    VENTILATOR SETTINGS:    INTAKE / OUTPUT: I/O last 3 completed shifts: In: 480 [P.O.:480] Out: 2400 [Urine:1950; Drains:450]  PHYSICAL EXAMINATION: General: Elderly gentleman, No distress Neuro: Awake, Follow commands. No focal deficits. HEENT: Moist mucous membranes, PERRL,  Cardiovascular: S1S2, no MRG Lungs: Clear, no wheeze or crackles Abdomen: +BS Musculoskeletal: Normal  tone and bulk.  Skin: No rash on exposed areas  LABS:  BMET  Recent Labs Lab 07/15/16 1527 07/16/16 0400 07/17/16 0605 07/17/16 1802  NA 137 138 141 144  K 3.1* 4.1 4.2 4.1  CL 105 106 107  --   CO2 27 26 28   --   BUN 9 8 15   --   CREATININE 1.15 1.03 1.08  --   GLUCOSE 177* 138* 150* 138*    Electrolytes  Recent Labs Lab 07/15/16 1527 07/16/16 0400 07/17/16 0605  CALCIUM 7.8* 8.1* 8.1*  MG 1.3* 1.4*  --   PHOS 3.1 2.6  --     CBC  Recent Labs Lab 07/15/16 1527 07/16/16 0400 07/17/16 0605 07/17/16 1802  WBC 20.6* 17.2* 18.2*  --   HGB 11.1* 10.8* 10.1* 9.2*  HCT 34.1* 32.7* 30.6* 27.0*  PLT 164 153 179  --     Coag's No results for input(s): APTT, INR in the last 168 hours.  Sepsis Markers No results for input(s): LATICACIDVEN, PROCALCITON, O2SATVEN in the last 168 hours.  ABG  Recent Labs Lab 07/15/16 1239 07/15/16 1615 07/16/16 0342  PHART 7.281* 7.415 7.420  PCO2ART 54.7* 40.5 41.4  PO2ART 124.0* 105* 95.8    Liver Enzymes  Recent Labs Lab 07/16/16 0400  ALBUMIN 3.0*    Cardiac Enzymes  Recent Labs Lab 07/15/16 1527 07/15/16 2155 07/16/16 0400  TROPONINI <0.03 <0.03 <0.03    Glucose No results for input(s): GLUCAP in the last 168 hours.  Imaging No results found.   STUDIES:  CTA Chest/Abd/Pelvis 7/19: Negative for bleed, PE  or dissection. TTE 7/19:  EF [id number], grade 1 diastolic dysfunction, trivial MR, mildly dilated RA, PA peak press 42mmHg. No regional wall motion abnormalities.  Port CXR 7/20: Persistent bibasilar opacities  MICROBIOLOGY: MRSA PCR 7/11:  Negative   ANTIBIOTICS: Ancef Peri-op  SIGNIFICANT EVENTS: 7/19 - surgery aborted 2/2 hypotensive event. PCCM consulted for evaluation 7/20 - extubated  LINES/TUBES: OETT 7.5 7/19 - 7/20 Back Drain 7/19>>> R rad aline 7/19> out   DISCUSSION: 70 yo male with PMH of HTN, pre-diabetes, OSA with CPAP, bladder cancer, post-op nausea and vomiting. PCCM  was consulted for evaluation of hypotension during lumbar surgery which was aborted.   ASSESSMENT / PLAN:  PULMONARY A: Acute Hypercarbic Respiratory Failure - Mild. Resolved. Intubation - Periop. Now Extubated. Dependent Lung Opacities  P:   No 02 needed  CPAP hs as at home   CARDIOVASCULAR A:  Severe Hypotension intra-op- Resolved. Unclear etiology.  Differential diagnosis includes air embolism versus anaphylactic shock. Trop I neg. Echo normal H/O Hypertension Tachycardia - resolved Right BBB  P:   ok to add back diovan as per outpt rx prior to d/c - sooner if sbp > 140 consistently   RENAL A:   Hypomagnesemia - Replaced. Hypokalemia - Resolved.  P:   Monitor I&O Replete lytes     HEMATOLOGIC A:   Anemia - Secondary to acute blood loss - 14.9 > 11.9. No signs of active bleeding. Leukocytosis - Likely stress response. Improving.  P:  Trending cell counts w/ CBC Transfuse for hgb < 7.0 SCD for DVT prophylaxis Holding chemoprophylaxis in the setting of surgery  INFECTIOUS A:   No acute issues  P:   Ancef intra-op/periop per NS  No indication for additoinal abx from out perspective Follow fever curve Tylenol for temp > 101.5  ENDOCRINE A:   H/O Pre-Diabetes  P:   CBG Q4 hours  Can add SSI if needed On steroids as per neurosurgery.  NEUROLOGIC A:   Unstable L3-L5 - surgery aborted 1/2 way thru. Post-Op Pain Control  P:    Rx per NS     Looks great on RA,  Noct cpap as at home  Pulmonary/ CCM f/u can be prn    Please call if needed    Christinia Gully, MD Pulmonary and [place] [phone] After 5:30 PM or weekends, call [phone]

## 2016-07-19 NOTE — Progress Notes (Signed)
Physical Therapy Treatment Patient Details Name: Zachary [name] MRN: [id number] DOB: [birth year] Today's Date: 07/19/2016    History of Present Illness 70 y.o. male s/p L3-4 and L4-5 PLIF. PMH significant for HTN, prediabetes, joint pain, bladder cancer, anxiety, cluster headaches, and arthritis.    PT Comments    Pt is up to walk with PT and instructed in LE exercises, which he can partially recall for PT.  Will continue to follow acutely as pt is not able to don brace, unsafe to walk alone and cannot recall instructions completely for exercise or for his spine.  Follow Up Recommendations  SNF     Equipment Recommendations  None recommended by PT    Recommendations for Other Services Rehab consult     Precautions / Restrictions Precautions Precautions: Back;Fall Precaution Booklet Issued: Yes (comment) Precaution Comments: Reviewed and pt did not remember arching and lifting Required Braces or Orthoses: Spinal Brace Spinal Brace: Lumbar corset;Applied in sitting position (Pt needs assistance to fit correctly) Restrictions Weight Bearing Restrictions: No    Mobility  Bed Mobility Overal bed mobility: Needs Assistance Bed Mobility: Supine to Sit;Sit to Supine     Supine to sit: Min assist Sit to supine: Min assist;Mod assist   General bed mobility comments: min mod to lift legs and reposition trunk  Transfers Overall transfer level: Needs assistance Equipment used: Rolling walker (2 wheeled) Transfers: Sit to/from [facility] Sit to Stand: Supervision;Min guard Stand pivot transfers: Min guard;Supervision          Ambulation/Gait Ambulation/Gait assistance: Min guard Ambulation Distance (Feet): 100 Feet Assistive device: Rolling walker (2 wheeled) Gait Pattern/deviations: Step-to pattern;Step-through pattern;Decreased stride length;Trunk flexed;Wide base of support (reminders for hand placement and for posture) Gait velocity: reduced Gait  velocity interpretation: Below normal speed for age/gender General Gait Details: speed very controlled   Stairs            Wheelchair Mobility    Modified Rankin (Stroke Patients Only)       Balance     Sitting balance-Leahy Scale: Good       Standing balance-Leahy Scale: Fair                      Cognition Arousal/Alertness: Awake/alert Behavior During Therapy: WFL for tasks assessed/performed Overall Cognitive Status: Within Functional Limits for tasks assessed                      Exercises General Exercises - Lower Extremity Ankle Circles/Pumps: AROM;Both;10 reps Quad Sets: AROM;Both;10 reps Gluteal Sets: AROM;Both;10 reps    General Comments        Pertinent Vitals/Pain Pain Assessment: 0-10 Pain Score: 6  Pain Location: back Pain Descriptors / Indicators: Aching;Sore Pain Intervention(s): Monitored during session;Premedicated before session;Repositioned    Home Living                      Prior Function            PT Goals (current goals can now be found in the care plan section) Acute Rehab PT Goals Patient Stated Goal: to go to [facility] and then get back to the football field Progress towards PT goals: Progressing toward goals    Frequency  Min 5X/week    PT Plan Current plan remains appropriate    Co-evaluation             End of Session Equipment Utilized During Treatment: Gait belt;Back brace  Activity Tolerance: Patient tolerated treatment well;No increased pain Patient left: in bed;with call bell/phone within reach;with SCD's reapplied     Time: [id number] PT Time Calculation (min) (ACUTE ONLY): 30 min  Charges:  $Gait Training: 8-22 mins $Therapeutic Exercise: 8-22 mins                    G Codes:      Ramond Dial Aug 16, 2016, 11:42 AM    Mee Hives, PT MS Acute Rehab Dept. Number: [place] and [place]

## 2016-07-20 ENCOUNTER — Encounter (HOSPITAL_COMMUNITY): Payer: Self-pay

## 2016-07-20 ENCOUNTER — Other Ambulatory Visit: Payer: Self-pay

## 2016-07-20 ENCOUNTER — Inpatient Hospital Stay
Admission: RE | Admit: 2016-07-20 | Discharge: 2016-07-24 | Disposition: A | Discharge status: Other Acute Inpatient Hospital | Payer: Medicare Other | Source: Ambulatory Visit | Attending: Internal Medicine | Admitting: Internal Medicine

## 2016-07-20 DIAGNOSIS — M4326 Fusion of spine, lumbar region: Secondary | ICD-10-CM | POA: Diagnosis not present

## 2016-07-20 DIAGNOSIS — R42 Dizziness and giddiness: Secondary | ICD-10-CM | POA: Diagnosis not present

## 2016-07-20 DIAGNOSIS — R279 Unspecified lack of coordination: Secondary | ICD-10-CM | POA: Diagnosis not present

## 2016-07-20 DIAGNOSIS — Z79899 Other long term (current) drug therapy: Secondary | ICD-10-CM | POA: Diagnosis not present

## 2016-07-20 DIAGNOSIS — Z48811 Encounter for surgical aftercare following surgery on the nervous system: Secondary | ICD-10-CM | POA: Diagnosis not present

## 2016-07-20 DIAGNOSIS — K5900 Constipation, unspecified: Secondary | ICD-10-CM | POA: Diagnosis not present

## 2016-07-20 DIAGNOSIS — F411 Generalized anxiety disorder: Secondary | ICD-10-CM | POA: Diagnosis not present

## 2016-07-20 DIAGNOSIS — R1310 Dysphagia, unspecified: Secondary | ICD-10-CM | POA: Diagnosis not present

## 2016-07-20 DIAGNOSIS — M4806 Spinal stenosis, lumbar region: Secondary | ICD-10-CM | POA: Diagnosis not present

## 2016-07-20 DIAGNOSIS — M1990 Unspecified osteoarthritis, unspecified site: Secondary | ICD-10-CM | POA: Diagnosis not present

## 2016-07-20 DIAGNOSIS — I959 Hypotension, unspecified: Secondary | ICD-10-CM | POA: Diagnosis present

## 2016-07-20 DIAGNOSIS — I10 Essential (primary) hypertension: Secondary | ICD-10-CM | POA: Diagnosis not present

## 2016-07-20 DIAGNOSIS — M549 Dorsalgia, unspecified: Secondary | ICD-10-CM | POA: Diagnosis not present

## 2016-07-20 DIAGNOSIS — G4733 Obstructive sleep apnea (adult) (pediatric): Secondary | ICD-10-CM | POA: Diagnosis not present

## 2016-07-20 DIAGNOSIS — M6281 Muscle weakness (generalized): Secondary | ICD-10-CM | POA: Diagnosis not present

## 2016-07-20 DIAGNOSIS — Z87891 Personal history of nicotine dependence: Secondary | ICD-10-CM | POA: Diagnosis not present

## 2016-07-20 DIAGNOSIS — R293 Abnormal posture: Secondary | ICD-10-CM | POA: Diagnosis not present

## 2016-07-20 DIAGNOSIS — R262 Difficulty in walking, not elsewhere classified: Secondary | ICD-10-CM | POA: Diagnosis not present

## 2016-07-20 DIAGNOSIS — F329 Major depressive disorder, single episode, unspecified: Secondary | ICD-10-CM | POA: Diagnosis not present

## 2016-07-20 DIAGNOSIS — G44009 Cluster headache syndrome, unspecified, not intractable: Secondary | ICD-10-CM | POA: Diagnosis not present

## 2016-07-20 DIAGNOSIS — I951 Orthostatic hypotension: Secondary | ICD-10-CM | POA: Diagnosis not present

## 2016-07-20 DIAGNOSIS — Z8679 Personal history of other diseases of the circulatory system: Secondary | ICD-10-CM | POA: Diagnosis not present

## 2016-07-20 DIAGNOSIS — Z8551 Personal history of malignant neoplasm of bladder: Secondary | ICD-10-CM | POA: Diagnosis not present

## 2016-07-20 DIAGNOSIS — I950 Idiopathic hypotension: Secondary | ICD-10-CM | POA: Diagnosis not present

## 2016-07-20 DIAGNOSIS — R7303 Prediabetes: Secondary | ICD-10-CM | POA: Diagnosis not present

## 2016-07-20 DIAGNOSIS — D649 Anemia, unspecified: Secondary | ICD-10-CM | POA: Diagnosis not present

## 2016-07-20 DIAGNOSIS — R739 Hyperglycemia, unspecified: Secondary | ICD-10-CM | POA: Diagnosis not present

## 2016-07-20 MED ORDER — DIAZEPAM 5 MG PO TABS: 5.0000 mg | ORAL_TABLET | Freq: Two times a day (BID) | ORAL | 0 refills | Status: DC | PRN

## 2016-07-20 MED ORDER — OXYCODONE HCL 5 MG PO TABS: 5.0000 mg | ORAL_TABLET | Freq: Four times a day (QID) | ORAL | 0 refills | Status: DC | PRN

## 2016-07-20 MED ORDER — CYCLOBENZAPRINE HCL 10 MG PO TABS: 10.0000 mg | ORAL_TABLET | Freq: Three times a day (TID) | ORAL | 2 refills | Status: DC | PRN

## 2016-07-20 MED ORDER — METHYLPREDNISOLONE 4 MG PO TABS: ORAL_TABLET | ORAL | 0 refills | Status: DC

## 2016-07-20 NOTE — Progress Notes (Signed)
Pt being discharged from hospital per orders from MD. Pt being transferred to [facility]. RN called [facility] and gave report to [place]. Pt and spouse also educated on incision maintenance. Pt and spouse verbalized understanding. Pt transferred via stretcher.

## 2016-07-20 NOTE — Discharge Summary (Signed)
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  They were discharged in stable condition.  Follow up: 3 weeks    Medication List    STOP taking these medications   ibuprofen 200 MG tablet Commonly known as:  ADVIL,MOTRIN     TAKE these medications   acetaminophen 500 MG tablet Commonly known as:  TYLENOL Take 1,000-1,500 mg by mouth every 6 (six) hours as needed for mild pain or headache. For pain   cyclobenzaprine 10 MG tablet Commonly known as:  FLEXERIL Take 1 tablet (10 mg total) by mouth 3 (three) times daily as needed for muscle spasms.   diazepam 5 MG tablet Commonly known as:  VALIUM Take 1 tablet (5 mg total) by mouth 2 (two) times daily as needed for anxiety.   escitalopram 20 MG tablet Commonly known as:  LEXAPRO Take 20 mg by mouth daily. Reported on 07/03/2016   escitalopram 10 MG tablet Commonly known as:  LEXAPRO Take 1 tablet (10 mg total) by mouth daily.   fluticasone 50 MCG/ACT nasal spray Commonly known as:  FLONASE Place 1 spray into both nostrils daily.   methylPREDNISolone 4 MG tablet Commonly known as:  MEDROL Dispense 1 medrol dose pack, take as directed   multivitamin with minerals Tabs tablet Take 1 tablet by mouth daily.   NON FORMULARY cpap   NON FORMULARY Apply 1 application topically as needed (C). Compound Cream from MD office   oxyCODONE 5 MG immediate release tablet Commonly known as:  Oxy IR/ROXICODONE Take 1 tablet (5 mg total) by mouth every 6 (six) hours as needed for severe pain.   silodosin 8 MG Caps  capsule Commonly known as:  RAPAFLO Take 8 mg by mouth daily with breakfast.   valsartan-hydrochlorothiazide 320-12.5 MG tablet Commonly known as:  DIOVAN-HCT Take 1 tablet by mouth daily.

## 2016-07-20 NOTE — Progress Notes (Signed)
Patient will DC to: [facility] Anticipated DC date: 07/20/16 Family notified: Spouse at bedside Transport by: Glenna Fellows   Per MD patient ready for DC to . RN, patient, patient's family, and facility notified of DC. RN given number for report. DC packet on chart. Ambulance transport requested for patient.   CSW signing off.  Cedric Fishman, [place] Social Worker [phone]

## 2016-07-20 NOTE — Telephone Encounter (Signed)
RX sent to [facility] FAX [phone], Phone [phone]  

## 2016-07-20 NOTE — Progress Notes (Signed)
Physical Therapy Treatment Patient Details Name: Zachary [name] MRN: [id number] DOB: [birth year] Today's Date: 07/20/2016    History of Present Illness 70 y.o. male s/p L3-4 and L4-5 PLIF. PMH significant for HTN, prediabetes, joint pain, bladder cancer, anxiety, cluster headaches, and arthritis.    PT Comments    Pt tolerated treatment well today with no increase in pain. Pt required verbal cues to maintain back precautions during bed mobility, transfers, and ambulation. He was able to don lumbar brace mod I while seated today. He ambulated 125 ft with RW and min guard. Pt educated on LE exercises and was able to recall these exercises to perform intermittently throughout the day. Continue acute follow for safety, mobility, and gait training.   Follow Up Recommendations  SNF     Equipment Recommendations  None recommended by PT    Recommendations for Other Services Rehab consult     Precautions / Restrictions Precautions Precautions: Back;Fall Precaution Comments: Pt able to recall 2/3 precautions at beginning of treatment. Precautions reviewed, pt able to recall 3/3 at end of treatment.  Needs reinforcement during functional activity.  Required Braces or Orthoses: Spinal Brace Spinal Brace: Lumbar corset;Applied in sitting position Restrictions Weight Bearing Restrictions: No    Mobility  Bed Mobility Overal bed mobility: Needs Assistance Bed Mobility: Rolling;Sidelying to Sit Rolling: Min guard Sidelying to sit: Min guard       General bed mobility comments: min verbal cueing to maintain precautions and avoid twisting  Transfers Overall transfer level: Needs assistance Equipment used: Rolling walker (2 wheeled) Transfers: Sit to/from Stand Sit to Stand: Min guard         General transfer comment: Requires cues to decrease torso bending sit<>stand and for hand placement on bed during sitting  Ambulation/Gait Ambulation/Gait assistance: Min guard Ambulation  Distance (Feet): 125 Feet Assistive device: Rolling walker (2 wheeled) Gait Pattern/deviations: Step-through pattern;Decreased stride length;Narrow base of support;Trunk flexed Gait velocity: reduced Gait velocity interpretation: Below normal speed for age/gender General Gait Details: Min verbal cueing for increased stride length. Mod verbal cueing to stand up straight and reduce bending.    Stairs            Wheelchair Mobility    Modified Rankin (Stroke Patients Only)       Balance Overall balance assessment: Needs assistance Sitting-balance support: Feet supported;No upper extremity supported Sitting balance-Leahy Scale: Good     Standing balance support: During functional activity;Bilateral upper extremity supported Standing balance-Leahy Scale: Fair                      Cognition Arousal/Alertness: Awake/alert Behavior During Therapy: WFL for tasks assessed/performed Overall Cognitive Status: Within Functional Limits for tasks assessed       Memory: Decreased recall of precautions              Exercises General Exercises - Lower Extremity Ankle Circles/Pumps: AROM;Both;10 reps Quad Sets: AROM;Both;10 reps Gluteal Sets: AROM;Both;10 reps Long Arc Quad: AROM;Both;10 reps;Seated    General Comments General comments (skin integrity, edema, etc.): Pt with no memory of LE exercises from previous session.       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 PT Goal Formulation: With  patient Time For Goal Achievement: 08/01/16 Potential to Achieve Goals: Good Progress towards PT goals: Progressing toward goals    Frequency  Min 5X/week    PT Plan Current plan remains appropriate    Co-evaluation             End of Session Equipment Utilized During Treatment: Gait belt;Back brace Activity Tolerance: Patient tolerated treatment well Patient left: in bed;with bed alarm set;with call bell/phone within reach;with family/visitor present (EOB for lunch)     Time: [id number] PT Time Calculation (min) (ACUTE ONLY): 27 min  Charges:  $Gait Training: 8-22 mins $Therapeutic Exercise: 8-22 mins                    G Codes:      Haylo Fake 2016-07-23, 2:55 PM Tawni Millers, SPT (student physical therapist) Acute Rehabilitation Services [phone]

## 2016-07-20 NOTE — NC FL2 (Signed)
[place] LEVEL OF CARE SCREENING TOOL     IDENTIFICATION  Patient Name: Zachary [name] Birthdate: 12-21-1946 Sex: male Admission Date (Current Location): 07/15/2016  [facility] and [place] Number:  [employer] and Address:  The . [facility] [place] [address] [place], [place] [address]      Provider Number: [id number]  Attending Physician Name and Address:  Kary Kos, MD  Relative Name and Phone Number:       Current Level of Care: Hospital Recommended Level of Care: [place] Prior Approval Number:    Date Approved/Denied:   PASRR Number: [id number]  Discharge Plan: SNF    Current Diagnoses: Patient Active Problem List   Diagnosis Date Noted  . Spinal stenosis of lumbar region 07/15/2016  . Bladder tumor 07/27/2015  . HNP (herniated nucleus pulposus), cervical 06/05/2013    Class: Diagnosis of  . 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  . Muscle spasms of neck 04/04/2013  . 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 OA (osteoarthritis) of knee 02/22/2013  . Iliotibial band syndrome of left side 11/22/2012  . Localized, primary osteoarthritis of the ankle and foot 10/11/2012  . Difficulty in walking(719.7) 08/17/2012  . Peroneal tendinitis 08/16/2012  . Precordial pain 02/09/2012  . Essential hypertension, benign 02/09/2012  . Mixed hyperlipidemia 02/09/2012  . Dysthymia 02/04/2012  . Abnormality of gait 12/23/2011  . Lateral epicondylitis/tennis elbow 05/06/2011  . TENOSYNOVITIS OF FOOT AND ANKLE 10/22/2010    Orientation RESPIRATION BLADDER Height & Weight     Self, Time, Situation, Place  Normal Continent Weight: 110.6 kg (243 lb 12.8 oz) Height:  6' (182.9 cm)  BEHAVIORAL SYMPTOMS/MOOD NEUROLOGICAL BOWEL NUTRITION STATUS      Continent Diet (Please see DC Summary)  AMBULATORY STATUS COMMUNICATION OF NEEDS Skin   Supervision Verbally Surgical wounds (Closed incision on back)                        Personal Care Assistance Level of Assistance  Bathing, Feeding, Dressing Bathing Assistance: Limited assistance Feeding assistance: Independent Dressing Assistance: Limited assistance     Functional Limitations Info             SPECIAL CARE FACTORS FREQUENCY  PT (By licensed PT)     PT Frequency: 5x/week              Contractures      Additional Factors Info  Code Status, Allergies Code Status Info: Full Allergies Info: Sulfonamide Derivatives           Current Medications (07/20/2016):  This is the current hospital active medication list Current Facility-Administered Medications  Medication Dose Route Frequency Provider Last Rate Last Dose  . 0.9 %  sodium chloride infusion  250 mL Intravenous Continuous Kary Kos, MD      . 0.9 %  sodium chloride infusion  250 mL Intravenous Continuous Kary Kos, MD      . acetaminophen (TYLENOL) tablet 650 mg  650 mg Oral Q4H PRN Kary Kos, MD   650 mg at 07/17/16 [id number]   Or  . acetaminophen (TYLENOL) suppository 650 mg  650 mg Rectal Q4H PRN Kary Kos, MD      . acetaminophen (TYLENOL) tablet 650 mg  650 mg Oral Q4H PRN Kary Kos, MD       Or  . acetaminophen (TYLENOL) suppository 650 mg  650 mg Rectal Q4H PRN Kary Kos, MD      .  alum & mag hydroxide-simeth (MAALOX/MYLANTA) 200-200-20 MG/5ML suspension 30 mL  30 mL Oral Q6H PRN Kary Kos, MD   30 mL at 07/19/16 0445  . cyclobenzaprine (FLEXERIL) tablet 10 mg  10 mg Oral TID PRN Kary Kos, MD   10 mg at 07/18/16 2221  . cyclobenzaprine (FLEXERIL) tablet 10 mg  10 mg Oral TID PRN Kary Kos, MD   10 mg at 07/19/16 2247  . dexamethasone (DECADRON) injection 4 mg  4 mg Intravenous Q6H Kary Kos, MD   4 mg at 07/19/16 0511   Or  . dexamethasone (DECADRON) tablet 4 mg  4 mg Oral Q6H Kary Kos, MD   4 mg at 07/20/16 [id number]  . docusate sodium (COLACE) capsule 100 mg  100 mg Oral BID Kary Kos, MD   100 mg at 07/19/16 2156  . escitalopram (LEXAPRO) tablet 20 mg  20 mg Oral Daily  Kary Kos, MD   20 mg at 07/19/16 0950  . famotidine (PEPCID) tablet 20 mg  20 mg Oral BID Kary Kos, MD   20 mg at 07/19/16 2156  . fluticasone (FLONASE) 50 MCG/ACT nasal spray 1 spray  1 spray Each Nare Daily Kary Kos, MD   1 spray at 07/19/16 0951  . HYDROmorphone (DILAUDID) injection 0.5-1 mg  0.5-1 mg Intravenous Q2H PRN Kary Kos, MD   1 mg at 07/17/16 2213  . lactated ringers infusion   Intravenous Continuous Erick Colace, NP   Stopped at 07/17/16 1540  . menthol-cetylpyridinium (CEPACOL) lozenge 3 mg  1 lozenge Oral PRN Kary Kos, MD       Or  . phenol (CHLORASEPTIC) mouth spray 1 spray  1 spray Mouth/Throat PRN Kary Kos, MD      . menthol-cetylpyridinium (CEPACOL) lozenge 3 mg  1 lozenge Oral PRN Kary Kos, MD       Or  . phenol (CHLORASEPTIC) mouth spray 1 spray  1 spray Mouth/Throat PRN Kary Kos, MD      . multivitamin with minerals tablet 1 tablet  1 tablet Oral Daily Kary Kos, MD   1 tablet at 07/19/16 0950  . ondansetron (ZOFRAN) injection 4 mg  4 mg Intravenous Q4H PRN Kary Kos, MD      . ondansetron [facility]) injection 4 mg  4 mg Intravenous Q4H PRN Kary Kos, MD      . oxyCODONE-acetaminophen (PERCOCET/ROXICET) 5-325 MG per tablet 1-2 tablet  1-2 tablet Oral Q4H PRN Kary Kos, MD   2 tablet at 07/19/16 2246  . polyethylene glycol (MIRALAX / GLYCOLAX) packet 17 g  17 g Oral Daily PRN Kary Kos, MD   17 g at 07/18/16 2206  . sodium chloride flush (NS) 0.9 % injection 3 mL  3 mL Intravenous Q12H Kary Kos, MD   3 mL at 07/17/16 2200  . sodium chloride flush (NS) 0.9 % injection 3 mL  3 mL Intravenous PRN Kary Kos, MD      . sodium chloride flush (NS) 0.9 % injection 3 mL  3 mL Intravenous Q12H Kary Kos, MD   3 mL at 07/19/16 2157  . sodium chloride flush (NS) 0.9 % injection 3 mL  3 mL Intravenous PRN Kary Kos, MD         Discharge Medications: Please see discharge summary for a list of discharge medications.  Relevant Imaging Results:  Relevant Lab  Results:   Additional Information SSN: [place], [place]

## 2016-07-20 NOTE — Care Management Note (Signed)
Case Management Note  Patient Details  Name: Zachary [name] MRN: [id number] Date of Birth: [birth year]  Subjective/Objective:   Pt underwent:  Implantation of Cages and Pedicle Screws Lumbar Four-Five, Lumbar Five- Sacral One. He is from home alone.            Action/Plan: PT/OT recommendations are for SNF. CM following for discharge needs.   Expected Discharge Date:                  Expected Discharge Plan:  [place]  In-House Referral:     Discharge planning Services     Post Acute Care Choice:    Choice offered to:     DME Arranged:    DME Agency:     HH Arranged:    [place] Agency:     Status of Service:  In process, will continue to follow  If discussed at Long Length of Stay Meetings, dates discussed:    Additional Comments:  Pollie Friar, RN 07/20/2016, 11:37 AM

## 2016-07-20 NOTE — Clinical Social Work Placement (Signed)
   CLINICAL SOCIAL WORK PLACEMENT  NOTE  Date:  07/20/2016  Patient Details  Name: Zachary [name] MRN: [id number] Date of Birth: [birth year]  Clinical Social Work is seeking post-discharge placement for this patient at the [place] level of care (*CSW will initial, date and re-position this form in  chart as items are completed):  Yes   Patient/family provided with [facility] Work Department's list of facilities offering this level of care within the geographic area requested by the patient (or if unable, by the patient's family).  Yes   Patient/family informed of their freedom to choose among providers that offer the needed level of care, that participate in Medicare, Medicaid or managed care program needed by the patient, have an available bed and are willing to accept the patient.  Yes   Patient/family informed of [facility]'s ownership interest in [facility] and [facility], as well as of the fact that they are under no obligation to receive care at these facilities.  PASRR submitted to EDS on       PASRR number received on       Existing PASRR number confirmed on 07/20/16     FL2 transmitted to all facilities in geographic area requested by pt/family on 07/20/16     FL2 transmitted to all facilities within larger geographic area on       Patient informed that his/her managed care company has contracts with or will negotiate with certain facilities, including the following:        Yes   Patient/family informed of bed offers received.  Patient chooses bed at [facility]     Physician recommends and patient chooses bed at      Patient to be transferred to [facility] on 07/20/16.  Patient to be transferred to facility by [facility]     Patient family notified on 07/20/16 of transfer.  Name of family member notified:  Wife at bedside     PHYSICIAN       Additional Comment:     _______________________________________________ Benard Halsted, [place] 07/20/2016, 1:45 PM

## 2016-07-20 NOTE — Care Management Important Message (Signed)
Important Message  Patient Details  Name: Zachary [name] MRN: [id number] Date of Birth: 13-Jan-1946   Medicare Important Message Given:  Yes    Loann Quill 07/20/2016, 9:02 AM

## 2016-07-21 ENCOUNTER — Encounter: Payer: Self-pay | Admitting: Internal Medicine

## 2016-07-21 ENCOUNTER — Non-Acute Institutional Stay (SKILLED_NURSING_FACILITY): Payer: Medicare Other | Admitting: Internal Medicine

## 2016-07-21 DIAGNOSIS — I10 Essential (primary) hypertension: Secondary | ICD-10-CM | POA: Diagnosis not present

## 2016-07-21 DIAGNOSIS — G4733 Obstructive sleep apnea (adult) (pediatric): Secondary | ICD-10-CM | POA: Diagnosis not present

## 2016-07-21 DIAGNOSIS — M4806 Spinal stenosis, lumbar region: Secondary | ICD-10-CM | POA: Diagnosis not present

## 2016-07-21 DIAGNOSIS — R739 Hyperglycemia, unspecified: Secondary | ICD-10-CM

## 2016-07-21 DIAGNOSIS — D649 Anemia, unspecified: Secondary | ICD-10-CM | POA: Insufficient documentation

## 2016-07-21 DIAGNOSIS — M48061 Spinal stenosis, lumbar region without neurogenic claudication: Secondary | ICD-10-CM

## 2016-07-21 NOTE — Assessment & Plan Note (Signed)
Controlled  BMET

## 2016-07-21 NOTE — Assessment & Plan Note (Signed)
Stable clinically.

## 2016-07-21 NOTE — Assessment & Plan Note (Signed)
As per [name] Verify last A1c value

## 2016-07-21 NOTE — Progress Notes (Signed)
Patient ID: Zachary [name], male   DOB: 1946-01-19, 70 y.o.   MRN: [id number]   This is a comprehensive admission note to [facility] personally performed by Unice Cobble MD on this date less than 30 days from date of admission. Included are preadmission medical/surgical history;reconciled medication list; family history; social history and comprehensive review of systems.  Corrections and additions to the records were documented . Comprehensive physical exam was also performed. Additionally a clinical summary was entered for each active diagnosis pertinent to this admission in the Problem List to enhance continuity of care.  PCP: Delphina Cahill MD  HPI: The patient was hospitalized 7/19-7/24/17 for surgical treatment of lumbar spinal stenosis due to degenerative disc disease and herniated nucleus pulposus at L3-4 and L4-5. Dr. Dominica Severin Cram,NS, was to perform L3-5 PLIF (laminectomy with complete medial facetectomies and radical foraminotomies and posterior lumbar interbody fusion) 7/19. Slightly more than 3 hours after anesthesia was initiated blood pressure dropped to the [id number] systolic range.This was after Cell Saver infusion. There was no significant response to Neo-Synephrine. He received 2 L of crystalloid and 500 mL of albumin as well as vasopressin, epinephrine, Benadryl, and hydrocortisone. There was no radiographic evidence of bleeding or PTE. At that point surgery was aborted and he was transferred to intensive care.Intubation was required because of respiratory failure. His films revealed left lower lobe infiltrate, and small left pleural effusion. The neurosurgical procedure was completed [id number] without complication. He was [facility] to [facility] for [facility] . Follow-up will be in 3 weeks with his neurosurgeon. During hospital stay he had mild-moderate hyperglycemia with glucoses ranging from 138-177. He had persistent hypocalcemia with a value of 8.1. Potassium of  3.1 was corrected. He had mild perioperative anemia. Because of intraoperative hypotension echocardiogram was performed. This revealed moderate left ventricular hypertrophy and injection ejection fraction of 65-70 percent.   Past medical and surgical history:He has a history of hypertension, prediabetes, obstructive sleep apnea (he uses CPAP),bladder cancer, & prostatic hypertrophy.  Social history: Reviewed and updated  Family history: Reviewed and updated  Comprehensive review of systems: He has occasional headaches which resolve with Tylenol. Headaches can be associated with some visual change which sounds like floaters. He states that his A1c was checked within the last month but he does not know the value. He does not check his glucoses. He does snore but his sleep apnea is well controlled with CPAP with which he is compliant. He states he had chest pain 2 weeks ago. Troponin done 7/19 was negative.  Since intubation is at increased thirst. He sees Dr. Harrington Challenger for ongoing anxiety and depression.  Constitutional: No fever,significant weight change, fatigue  Eyes: No redness, discharge, pain,  ENT/mouth: No nasal congestion,  purulent discharge, earache,change in hearing ,sore throat  Cardiovascular: No palpitations,paroxysmal nocturnal dyspnea, claudication, edema  Respiratory: No cough, sputum production,hemoptysis, DOE , significant snoring,apnea   Gastrointestinal: No heartburn,dysphagia,abdominal pain, nausea / vomiting,rectal bleeding, melena,change in bowels Genitourinary: No dysuria,hematuria, pyuria,  incontinence, nocturia Musculoskeletal: No joint stiffness, joint swelling, weakness,pain Dermatologic: No rash, pruritus, change in appearance of skin Neurologic: No dizziness,syncope, seizures, numbness , tingling Endocrine: No change in hair/skin/ nails, excessive hunger, excessive urination  Hematologic/lymphatic: No significant bruising, lymphadenopathy,abnormal  bleeding Allergy/immunology: No itchy/ watery eyes, significant sneezing, urticaria, angioedema  Physical exam:  Pertinent or positive findings: Pattern alopecia is present. He has a Engineering geologist. Heart sounds are slightly distant. He has trace ankle edema. Dorsalis pedis pulses are slightly  decreased.  General appearance:Adequately nourished; no acute distress , increased work of breathing is present.   Lymphatic: No lymphadenopathy about the head, neck, axilla . Eyes: No conjunctival inflammation or lid edema is present. There is no scleral icterus. Ears:  External ear exam shows no significant lesions or deformities.   Nose:  External nasal examination shows no deformity or inflammation. Nasal mucosa are pink and moist without lesions ,exudates Oral exam: lips and gums are healthy appearing.There is no oropharyngeal erythema or exudate . Neck:  No thyromegaly, masses, tenderness noted.    Heart:  Normal rate and regular rhythm. S1 and S2 normal without gallop, murmur, click, rub .  Lungs:Chest clear to auscultation without wheezes, rhonchi,rales , rubs. Abdomen:Bowel sounds are normal. Abdomen is soft and nontender with no organomegaly, hernias,masses. GU: deferred as previously addressed. Extremities:  No cyanosis, clubbing,edema  Neurologic exam : Balance,Rhomberg,finger to nose testing could not be completed due to clinical state Skin: Warm & dry w/o tenting. No significant lesions or rash.  See clinical summary under each active problem in the Problem List with associated updated therapeutic plan

## 2016-07-21 NOTE — Assessment & Plan Note (Signed)
CBC monitor

## 2016-07-21 NOTE — Assessment & Plan Note (Signed)
No sequellae; stable clinically

## 2016-07-21 NOTE — Patient Instructions (Signed)
orders for Matrix entry CBC and differential BMET

## 2016-07-22 ENCOUNTER — Encounter (HOSPITAL_COMMUNITY)
Admission: RE | Admit: 2016-07-22 | Discharge: 2016-07-22 | Disposition: A | Discharge status: Home / Self Care | Payer: Medicare Other | Source: Skilled Nursing Facility | Attending: Internal Medicine | Admitting: Internal Medicine

## 2016-07-22 ENCOUNTER — Non-Acute Institutional Stay (SKILLED_NURSING_FACILITY): Payer: Medicare Other | Admitting: Internal Medicine

## 2016-07-22 ENCOUNTER — Encounter: Payer: Self-pay | Admitting: Internal Medicine

## 2016-07-22 DIAGNOSIS — I951 Orthostatic hypotension: Secondary | ICD-10-CM | POA: Diagnosis not present

## 2016-07-22 DIAGNOSIS — D649 Anemia, unspecified: Secondary | ICD-10-CM

## 2016-07-22 DIAGNOSIS — I10 Essential (primary) hypertension: Secondary | ICD-10-CM | POA: Insufficient documentation

## 2016-07-22 DIAGNOSIS — F411 Generalized anxiety disorder: Secondary | ICD-10-CM | POA: Insufficient documentation

## 2016-07-22 DIAGNOSIS — R7303 Prediabetes: Secondary | ICD-10-CM | POA: Insufficient documentation

## 2016-07-22 DIAGNOSIS — K5900 Constipation, unspecified: Secondary | ICD-10-CM | POA: Diagnosis not present

## 2016-07-22 DIAGNOSIS — F329 Major depressive disorder, single episode, unspecified: Secondary | ICD-10-CM | POA: Insufficient documentation

## 2016-07-22 LAB — BASIC METABOLIC PANEL
ANION GAP: 5 (ref 5–15)
BUN: 28 mg/dL — ABNORMAL HIGH (ref 6–20)
CALCIUM: 7.8 mg/dL — AB (ref 8.9–10.3)
CO2: 28 mmol/L (ref 22–32)
Chloride: 103 mmol/L (ref 101–111)
Creatinine, Ser: 0.89 mg/dL (ref 0.61–1.24)
GLUCOSE: 90 mg/dL (ref 65–99)
POTASSIUM: 3.6 mmol/L (ref 3.5–5.1)
Sodium: 136 mmol/L (ref 135–145)

## 2016-07-22 LAB — CBC
HCT: 33.7 % — ABNORMAL LOW (ref 39.0–52.0)
Hemoglobin: 11.3 g/dL — ABNORMAL LOW (ref 13.0–17.0)
MCH: 30.5 pg (ref 26.0–34.0)
MCHC: 33.5 g/dL (ref 30.0–36.0)
MCV: 91.1 fL (ref 78.0–100.0)
PLATELETS: 197 10*3/uL (ref 150–400)
RBC: 3.7 MIL/uL — ABNORMAL LOW (ref 4.22–5.81)
RDW: 14.4 % (ref 11.5–15.5)
WBC: 14.2 10*3/uL — AB (ref 4.0–10.5)

## 2016-07-22 NOTE — Progress Notes (Signed)
Location:   [facility] Room Number: 123/P Place of Service:  SNF [phone]) Provider:  Fernand Parkins, MD  Patient Care Team: Celene Squibb, MD as PCP - General (Internal Medicine)  Extended Emergency Contact Information Primary Emergency Contact: Kutner,Patricia H Address: [address]          [place], [place] [address] Johnnette Litter of [place] Phone: [phone] Mobile Phone: [phone] Relation: Spouse Secondary Emergency Contact: Loreli Dollar [place] of [place] Phone: [phone] Relation: Sister  Code Status:  DNR Goals of care: Advanced Directive information Advanced Directives 07/22/2016  Does patient have an advance directive? Yes  Type of Paramedic of [place];Out of facility DNR (pink MOST or yellow form)  Does patient want to make changes to advanced directive? No - Patient declined  Copy of advanced directive(s) in chart? Yes  Would patient like information on creating an advanced directive? -  Pre-existing out of facility DNR order (yellow form or pink MOST form) -     Acute visit secondary to hypotension constipation  HPI:  Pt is a 70 y.o. male seen today for an acute visit for complaints of dizziness when standing.  Orthostatic blood pressures were taken and were significant for an orthostatic blood pressure systolically in the Q000111Q associated with dizziness and increased weakness.  Blood pressure lying showed a systolic of 99991111 and blood pressure while sitting was 100/70.  I don't note he is on Diovan/HCT 320-12.5  Mg---I also see is on Rapaflo 8 mg a day.  He is not complaining of any shortness of breath or chest pain is slightly diaphoretic  Patient was hospitalized from July 19 to July 20 03/29/2016 for surgical treatment of lumbar spinal stenosis due to degenerative disc disease and a herniated nucleus pulposus at L3-L4 and L4-L5.  Patient did undergo laminectomy-but 3 hours after  anesthesia was started blood pressure did drop to the Q000111Q systolic range.  He received aggressive treatment including crystalloid and 500 mL of albumin as well as vasopressin and epinephrine Benadryl and hydrocortisone.  Surgery was aborted and he was transferred to intensive care. And patient was required because of respiratory failure-films revealed a left lower lobe infiltrate and small left pleural effusion.  Neurosurgical procedure was completed on July 21 without complication.  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            Past Medical History:  Diagnosis Date  . Anxiety    takes Valium as needed  . Arthritis   . Bladder cancer ([place])    takes Rapaflo daily  . Blood dyscrasia    07/08/16: pt told he was a "free bleeder" after bleeding a lot when derm cut off mole on forehead. No excessive bleeding with minor wounds at home, no bleeding problems perioperatively with prior surgeries.  . Chronic back pain    spondylolisthesis  . Cluster headaches   . Depression    takes Lexapro daily  . Essential hypertension, benign    takes Diovan-HCT daily  . Joint pain   . Obstructive sleep apnea on CPAP    uses CPAP @ night  . Pneumonia    "several times"--last time about 3-4 yrs ago  . PONV (postoperative nausea and vomiting)    Hx: of "sometimes"  . Prediabetes    Past Surgical History:  Procedure Laterality Date  .  ANTERIOR CERVICAL DECOMP/DISCECTOMY FUSION N/A 06/05/2013   Procedure:  C3-4 Anterior Cervical Discectomy and Fusion, Allograft, Plate;  Surgeon: [name], MD;  Location: [facility];  Service: Orthopedics;  Laterality: N/A;  C3-4 Anterior Cervical Discectomy and Fusion, Allograft, Plate  . Arthroscopic knee surgery    . BACK SURGERY      x 2  . BLADDER SURGERY     x 5 to remove tumor  . CARPAL TUNNEL RELEASE      . CATARACT EXTRACTION W/PHACO  12/19/2012   Procedure: CATARACT EXTRACTION PHACO AND INTRAOCULAR LENS PLACEMENT (IOC);  Surgeon: [name], MD;  Location: [facility];  Service: Ophthalmology;  Laterality: Left;  CDE: 26.80  . CATARACT EXTRACTION W/PHACO  01/09/2013   Procedure: CATARACT EXTRACTION PHACO AND INTRAOCULAR LENS PLACEMENT (IOC);  Surgeon: [name], MD;  Location: [facility];  Service: Ophthalmology;  Laterality: Right;  CDE:22.83  . CERVICAL FUSION  06/05/2013   C 3  C4  . CYSTOSCOPY    . REFRACTIVE SURGERY     Hx: of  . RETINAL DETACHMENT SURGERY    . TRANSURETHRAL RESECTION OF BLADDER TUMOR Right 07/27/2015   Procedure: TRANSURETHRAL RESECTION OF BLADDER TUMOR (TURBT);  Surgeon: [name], MD;  Location: [facility];  Service: Urology;  Laterality: Right;    Allergies  Allergen Reactions  . Sulfonamide Derivatives Itching, Rash and Other (See Comments)    whelps    Current Outpatient Prescriptions on File Prior to Visit  Medication Sig Dispense Refill  . acetaminophen (TYLENOL) 500 MG tablet Take 500 mg by mouth every 6 (six) hours as needed for mild pain or headache. For pain     . cyclobenzaprine (FLEXERIL) 10 MG tablet Take 1 tablet (10 mg total) by mouth 3 (three) times daily as needed for muscle spasms. 90 tablet 2  . diazepam (VALIUM) 5 MG tablet Take 1 tablet (5 mg total) by mouth 2 (two) times daily as needed for anxiety. 60 tablet 0  . escitalopram (LEXAPRO) 20 MG tablet Take 20 mg by mouth daily. Reported on 07/03/2016 take along with 10 mg to equal 30 mg  3  . fluticasone (FLONASE) 50 MCG/ACT nasal spray Place 1 spray into both nostrils daily.    . methylPREDNISolone (MEDROL) 4 MG tablet Dispense 1 medrol dose pack, take as directed 21 tablet 0  . Multiple Vitamin (MULTIVITAMIN WITH MINERALS) TABS tablet Take 1 tablet by mouth daily.    . NON FORMULARY cpap    . oxyCODONE (OXY IR/ROXICODONE) 5 MG immediate release tablet Take 1 tablet (5 mg total) by mouth every 6  (six) hours as needed for severe pain. 120 tablet 0  . silodosin (RAPAFLO) 8 MG CAPS capsule Take 8 mg by mouth daily with breakfast.    . valsartan-hydrochlorothiazide (DIOVAN-HCT) 320-12.5 MG per tablet Take 1 tablet by mouth daily.       No current facility-administered medications on file prior to visit.      Review of Systems   In general is not complaining of fever or chills says he feels somewhat weak.  Skin does not complain of rashes or itching at times has some diaphoresis.  Head ears eyes nose mouth and throat does not really complain of visual changes does have some history what appears to be floaters does not complain sore throat at this time.  Respiratory is not complaining of shortness breath or cough.  Cardiac does not complain of chest pain.  GI is not complaining of nausea vomiting diarrhea-but  is complaining of constipation in nursing staff concurs.  GU is not complaining of dysuria.  Musculoskeletal at this point does not complain of pain.  Neurologic is not complaining of numbness does have some intermittent headaches which apparently are somewhat chronic.  Does complain of dizziness when he stands up.  Psych he does have a history of anxiety and depression is followed by Dr. Harrington Challenger psychiatrist.    Immunization History  Administered Date(s) Administered  . Influenza-Unspecified 07/28/2014, 09/28/2015   Pertinent  Health Maintenance Due  Topic Date Due  . COLONOSCOPY  11/18/1996  . PNA vac Low Risk Adult (1 of 2 - PCV13) 11/19/2011  . INFLUENZA VACCINE  07/28/2016   No flowsheet data found. Functional Status Survey:    Vitals:   07/22/16 1504  BP: 129/60  Pulse: 84  Resp: 20  Temp: 97.3 F (36.3 C)  TempSrc: Oral  SpO2: 94%  Weight: 243 lb 12.8 oz (110.6 kg)  Height: 6' (1.829 m)  Orthostatic blood pressures as noted above Body mass index is 33.07 kg/m. Physical Exam   In general this is a somewhat frail appearing middle-aged male in  no acute distress sitting in his wheelchair.  His skin is warm and slightly diaphoretic.  Eyes pupils appear reactive light visual acuity appears grossly intact.  Oropharynx is clear mucous membranes fairly moist.  Chest is clear to auscultation there is no labored breathing.  Heart is regular rate and rhythm with an occasional irregular beat-he does not really have significant lower extremity edema.  Abdomen is soft nontender with positive bowel sounds.  Musculoskeletal does have back brace applied is able to move all extremities strength appears to be grossly intact.  Neurologic is grossly intact cranial nerves intact to speech is clear no lateralizing findings.  Psych he is alert and oriented somewhat anxious.    Labs reviewed:  Recent Labs  07/15/16 1527 07/16/16 0400 07/17/16 0605 07/17/16 1802 07/22/16 0700  NA 137 138 141 144 136  K 3.1* 4.1 4.2 4.1 3.6  CL 105 106 107  --  103  CO2 27 26 28   --  28  GLUCOSE 177* 138* 150* 138* 90  BUN 9 8 15   --  28*  CREATININE 1.15 1.03 1.08  --  0.89  CALCIUM 7.8* 8.1* 8.1*  --  7.8*  MG 1.3* 1.4*  --   --   --   PHOS 3.1 2.6  --   --   --     Recent Labs  07/16/16 0400  ALBUMIN 3.0*    Recent Labs  07/15/16 1527 07/16/16 0400 07/17/16 0605 07/17/16 1802 07/22/16 0700  WBC 20.6* 17.2* 18.2*  --  14.2*  NEUTROABS 19.5* 16.0*  --   --   --   HGB 11.1* 10.8* 10.1* 9.2* 11.3*  HCT 34.1* 32.7* 30.6* 27.0* 33.7*  MCV 89.3 88.4 88.4  --  91.1  PLT 164 153 179  --  197   No results found for: TSH No results found for: HGBA1C No results found for: CHOL, HDL, LDLCALC, LDLDIRECT, TRIG, CHOLHDL  Significant Diagnostic Results in last 30 days:  Dg Lumbar Spine 2-3 Views  Result Date: 07/17/2016 CLINICAL DATA:  Patient status post cage and pedicle screw implantation. EXAM: DG C-ARM 1-60 MIN-NO REPORT; LUMBAR SPINE - 2-3 VIEW; DG C-ARM 61-120 MIN COMPARISON:  Lumbar spine MRI 06/12/2016. FINDINGS: 2 intraoperative  fluoroscopic images were submitted for interpretation. Intervertebral body spacers are demonstrated at the L3-4 and L4-5 level. Transpedicular  screws are present at the L3, L4 and L5 level. IMPRESSION: Two intraoperative fluoroscopic images as above demonstrating L3-L5 intervertebral body spacer and transpedicular screw placement. Electronically Signed   By: Lovey Newcomer M.D.   On: 07/17/2016 18:53   Dg Wrist Complete Left  Result Date: 07/02/2016 3 views left wrist left wrist fracture follow-up X-ray shows minor degenerative changes in the radius cyst formations fracture line well healed well Impression healed fracture minimal if any displacement  Dg Chest Port 1 View  Result Date: 07/16/2016 CLINICAL DATA:  Respiratory failure. EXAM: PORTABLE CHEST 1 VIEW COMPARISON:  07/15/2016.  05/17/2016. FINDINGS: Endotracheal tube tip 4 cm above the carina. Mediastinum and hilar structures normal. Heart size stable. Low lung volumes with bibasilar atelectasis. Unchanged persist left lower lobe infiltrate. Small left pleural effusion cannot be excluded. No pneumothorax. IMPRESSION: 1.  Endotracheal tube tip 4 cm above the carina. 2. Low lung volumes with persistent bibasilar atelectasis. Persistent unchanged left lower lobe infiltrate. Small left pleural effusion. Electronically Signed   By: Marcello Moores  Register   On: 07/16/2016 07:21   Dg Chest Port 1 View  Result Date: 07/15/2016 CLINICAL DATA:  Hypotensive event during surgery; hypoxia EXAM: PORTABLE CHEST 1 VIEW COMPARISON:  May 17, 2016 FINDINGS: Endotracheal tube tip is 5.2 cm above the carina. No pneumothorax. There is a focal area of airspace consolidation in the left base. Lungs elsewhere clear. Heart is upper normal in size with pulmonary vascularity within normal limits. No adenopathy. No bone lesions. IMPRESSION: Endotracheal tube as described without pneumothorax. Focal airspace consolidation left base. Question pneumonia versus aspiration as most likely  etiologies for this opacity. Lungs elsewhere clear. Stable cardiac silhouette. Electronically Signed   By: Lowella Grip III M.D.   On: 07/15/2016 15:29   Dg C-arm 1-60 Min  Result Date: 07/17/2016 CLINICAL DATA:  Patient status post cage and pedicle screw implantation. EXAM: DG C-ARM 1-60 MIN-NO REPORT; LUMBAR SPINE - 2-3 VIEW; DG C-ARM 61-120 MIN COMPARISON:  Lumbar spine MRI 06/12/2016. FINDINGS: 2 intraoperative fluoroscopic images were submitted for interpretation. Intervertebral body spacers are demonstrated at the L3-4 and L4-5 level. Transpedicular screws are present at the L3, L4 and L5 level. IMPRESSION: Two intraoperative fluoroscopic images as above demonstrating L3-L5 intervertebral body spacer and transpedicular screw placement. Electronically Signed   By: Lovey Newcomer M.D.   On: 07/17/2016 18:53   Dg C-arm 1-60 Min-no Report  Result Date: 07/15/2016 CLINICAL DATA: L3-5 PLIF C-ARM 1-60 MINUTES Fluoroscopy was utilized by the requesting physician.  No radiographic interpretation.   Ct Angio Chest/abd/pel For Dissection W And/or W/wo  Result Date: 07/15/2016 CLINICAL DATA:  Acute onset of hypotension and tachycardia during lumbar spine fusion. Evaluate for pulmonary embolism, vascular injury and retroperitoneal hematoma. EXAM: CT ANGIOGRAPHY CHEST, ABDOMEN AND PELVIS TECHNIQUE: Multidetector CT imaging through the chest, abdomen and pelvis was performed using the standard protocol during bolus administration of intravenous contrast. Multiplanar reconstructed images and MIPs were obtained and reviewed to evaluate the vascular anatomy. CONTRAST:  100 ml Isovue 370. COMPARISON:  Abdominal pelvic CT 12/26/2015 and 07/31/2015. FINDINGS: CTA CHEST FINDINGS Cardiovascular: Pre contrast images demonstrate no displaced intimal calcifications within the thoracic aorta. There is atherosclerosis of the aorta and coronary arteries. Post-contrast, there is adequate opacification of the pulmonary  arteries and systemic arteries. No evidence of acute pulmonary embolism. No evidence of aortic aneurysm or dissection. The heart size is normal. There is no pericardial effusion. Mediastinum/Nodes: There are no enlarged mediastinal, hilar or axillary lymph  nodes. Small hiatal hernia. Lungs/Pleura: There is no pleural effusion. There are dependent confluent airspace opacities in both lower lobes with associated volume loss and air bronchograms. Appearance is suspicious for possible aspiration. There is no endobronchial lesion. Endotracheal tube tip is in the midtrachea. Musculoskeletal/Chest wall: There is no chest wall mass or suspicious osseous finding. Review of the MIP images confirms the above findings. CTA ABDOMEN AND PELVIS FINDINGS Vascular: Compared with the prior CT, there is mildly percent dilatation of the right common iliac artery which measures up to 1.9 cm on coronal image 67 of series 504. There is mild associated intimal irregularity. The abdominal aorta is normal in caliber. There is no evidence of aortic dissection or retroperitoneal hematoma. There is no evidence of branch vessel injury or large vessel occlusion. There is a small accessory renal artery on the right. Atherosclerosis of the aorta and its branch vessels is noted. Venous opacification is limited without apparent acute injury. Hepatobiliary: The liver is normal in density without focal abnormality. No evidence of gallstones, gallbladder wall thickening or biliary dilatation. Pancreas: Unremarkable. No pancreatic ductal dilatation or surrounding inflammatory changes. Spleen: Normal in size without focal abnormality. Stable small splenule. Adrenals/Urinary Tract: Both adrenal glands appear normal. Both kidneys demonstrate mild cortical thinning and small low-density lesions, grossly stable and likely cysts. There are possible small renal caliceal calculi bilaterally. There is no evidence of hydronephrosis or ureteral calculus. However,  there is possible wall thickening of the left renal pelvis with adjacent perinephric soft tissue stranding which may be mildly progressive compared with prior CTs. The left ureter appears normal in caliber. The urinary bladder is partially decompressed by a Foley catheter and is suboptimally evaluated. Stomach/Bowel: No evidence of bowel wall thickening, distention or surrounding inflammatory change. Mild sigmoid diverticulosis. The appendix appears normal. Vascular/Lymphatic: Vascular findings described above. There are no enlarged abdominal pelvic lymph nodes. Reproductive: The prostate gland appears unremarkable. Other: Stable prominent fat in both inguinal canals. Stable small umbilical hernia containing only fat. Musculoskeletal: No acute or significant osseous findings. There are postsurgical changes status post laminectomy and interbody fusion at L3-4 and L4-5. Surgical drain is in place. No unexpected fluid collections are identified. Review of the MIP images confirms the above findings. IMPRESSION: 1. No evidence of acute pulmonary embolism. 2. Dependent airspace opacities with air bronchograms in both lung bases suspicious for possible aspiration. 3. No evidence of retroperitoneal hematoma or acute vascular injury. 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 4. Expected postsurgical changes related to recent L3-4 and L4-5 posterior decompression and interbody fusion. 5. Nonobstructing bilateral renal calculi. Possible progressive wall thickening of the left renal pelvis and adjacent perinephric soft tissue stranding. Given the patient's history of bladder cancer, urology follow-up recommended. Follow up CT to include pre contrast and delayed post-contrast imaging may be helpful to exclude a developing urothelial lesion. 6. The study was reviewed as it was being performed on 07/15/2016 at 1:50 pm with Dr. Kary Kos . Electronically Signed   By: Richardean Sale M.D.   On: 07/15/2016 14:35     Assessment/Plan  #1-dizziness with hypotension-this appears to be orthostatic hypotension as noted above-I have reviewed his meds he is on Rapaflo which can have a fairly serious side effect of orthostatic hypotension we will discontinue this for now-he says at times he did not take this at home-he is apparently followed by urology and does have a history of bladder carcinoma.  At this point will monitor off the Rapaflo.  We did do an EKG which showed normal sinus rhythm with a right bundle branch block which is not new again he is not complaining of any chest pain it appears this is more orthostatic related dizziness.  Will write orders to monitor orthostatic blood pressures daily with a log in provider book for review.  #2 constipation will add Senokot-S daily at bedtime and monitor he does not appear to be in any distress in this regards but does appear to be somewhat weak and anxious and this will have to be monitored.  #3 anxiety-depression he continues on Valium as well as Lexapro he is followed by Dr. Learta Codding does appear to be somewhat anxious I suspect this complicated hospital course has likely contributed to increased anxiety this will have to be monitored.  #4 anemia-lab done today actually shows improvement with a hemoglobin of 11.3-at this point monitor at periodic intervals his white count which had been over 18,000 and is now down to 14,200  Number 5--calcium on lab today was 7.8 renal function appeared to be fairly stable with a creatinine 0.89 BUN of 28-Dr. Linna Darner has added calcium 500 mg a day  [id number]-of note greater than 35 minutes spent assessing patient-discussing his status with nursing staff-reviewing his chart-reviewing his labs conducting investigations including EKG-and coordinating and formulating a plan of care-of note greater than 50% of time spent coordinating a plan of care         Oralia Manis, [place]

## 2016-07-23 ENCOUNTER — Non-Acute Institutional Stay (SKILLED_NURSING_FACILITY): Payer: Medicare Other | Admitting: Internal Medicine

## 2016-07-23 ENCOUNTER — Encounter: Payer: Self-pay | Admitting: Internal Medicine

## 2016-07-23 DIAGNOSIS — I951 Orthostatic hypotension: Secondary | ICD-10-CM | POA: Diagnosis not present

## 2016-07-23 DIAGNOSIS — R1310 Dysphagia, unspecified: Secondary | ICD-10-CM

## 2016-07-23 DIAGNOSIS — I10 Essential (primary) hypertension: Secondary | ICD-10-CM

## 2016-07-23 DIAGNOSIS — K219 Gastro-esophageal reflux disease without esophagitis: Secondary | ICD-10-CM | POA: Insufficient documentation

## 2016-07-23 LAB — HEMOGLOBIN A1C
Hgb A1c MFr Bld: 5.8 % — ABNORMAL HIGH (ref 4.8–5.6)
MEAN PLASMA GLUCOSE: 120 mg/dL

## 2016-07-23 NOTE — Progress Notes (Signed)
   This is a nursing facility follow up for specific acute issue of postural hypotension.   Interim medical record and care since last [facility] visit was updated with review of diagnostic studies and change in clinical status since last visit were documented.  HPI: Postural hypotension has been repeatedly documented since 7/26 in PT. The most recent values revealed supine blood pressure 104/53, sitting 91/47, and standing 79/37. Significantly he is on Diovan/HCT 320-12.5, Valium 5 mg twice a day when necessary and Flexeril 10 mg 3 times a day as needed. He states he has had some postural dizziness in the past to a minor degree. He denies any cardio or neurologic prodrome prior to his symptoms. He's had no bleeding dyscrasias. His hemoglobin improved from 9.2 on 7/21 to a value of 11.3 yesterday.  Review of systems: He has had some dysphagia since he was intubated. Denied were any change in heart rhythm or rate prior to the symptoms. There was no associated chest pain or shortness of breath . Also specifically denied prior to the episodes were headache, limb weakness, tingling, or numbness. No seizure activity noted. Epistaxis, hemoptysis, hematuria, melena, or rectal bleeding denied. No unexplained weight loss, significant dyspepsia, or abdominal pain.  There is no abnormal bruising , bleeding, or difficulty stopping bleeding with injury.  Physical exam:  Pertinent or positive findings: Pattern alopecia is present. He has a Engineering geologist. General appearance:Adequately nourished; no acute distress , increased work of breathing is present.   Lymphatic: No lymphadenopathy about the head, neck, axilla . Eyes: No conjunctival inflammation or lid edema is present. There is no scleral icterus. Ears:  External ear exam shows no significant lesions or deformities.   Nose:  External nasal examination shows no deformity or inflammation. Nasal mucosa are pink and moist without lesions ,exudates Oral  exam: lips and gums are healthy appearing.There is no oropharyngeal erythema or exudate . Neck:  No thyromegaly, masses, tenderness noted.    Heart:  Normal rate and regular rhythm. S1 and S2 normal without gallop, murmur, click, rub .  Lungs:Chest clear to auscultation without wheezes, rhonchi,rales , rubs. Abdomen:Bowel sounds are normal. Abdomen is soft and nontender with no organomegaly, hernias,masses. GU: deferred as previously addressed. Extremities:  No cyanosis, clubbing,edema  Neurologic exam : Strength equal  in upper & lower extremities Balance,Rhomberg,finger to nose testing could not be completed due to clinical state Deep tendon reflexes are equal Skin: Warm & dry w/o tenting. No significant lesions or rash.    #1 postural hypotension in the context of high-dose Diovan/HCT as well as as needed Valium and Flexeril. #2 dysphagia Plan: See orders

## 2016-07-23 NOTE — Assessment & Plan Note (Signed)
Make Valium and Flexeril as needed only; risk of these aggravating  low blood pressure discussed with the patient. DC HCT and decrease dose of Diovan.

## 2016-07-23 NOTE — Progress Notes (Signed)
Patient ID: Zachary [name], male   DOB: 10-14-1946, 70 y.o.   MRN: [id number]

## 2016-07-23 NOTE — Patient Instructions (Signed)
Perform isometric exercise of calves  ( while seated go up on toes to count of 5 & then onto heels for 5 count). Repeat  4- 5 times prior to standing if you've been seated or supine for any significant period of time as BP drops with such positions.  TED hose.

## 2016-07-23 NOTE — Assessment & Plan Note (Signed)
See changes in antihypertensive due to postural hypotension

## 2016-07-24 ENCOUNTER — Inpatient Hospital Stay
Admission: RE | Admit: 2016-07-24 | Discharge: 2016-08-08 | Disposition: A | Discharge status: Home / Self Care | Payer: Medicare Other | Source: Ambulatory Visit | Attending: Internal Medicine | Admitting: Internal Medicine

## 2016-07-24 ENCOUNTER — Encounter: Payer: Self-pay | Admitting: Internal Medicine

## 2016-07-24 ENCOUNTER — Encounter (HOSPITAL_COMMUNITY): Payer: Self-pay

## 2016-07-24 ENCOUNTER — Emergency Department (HOSPITAL_COMMUNITY)
Admission: EM | Admit: 2016-07-24 | Discharge: 2016-07-24 | Disposition: A | Discharge status: Home / Self Care | Payer: Medicare Other | Attending: Emergency Medicine | Admitting: Emergency Medicine

## 2016-07-24 ENCOUNTER — Non-Acute Institutional Stay (SKILLED_NURSING_FACILITY): Payer: Medicare Other | Admitting: Internal Medicine

## 2016-07-24 DIAGNOSIS — Z8551 Personal history of malignant neoplasm of bladder: Secondary | ICD-10-CM | POA: Insufficient documentation

## 2016-07-24 DIAGNOSIS — Z87891 Personal history of nicotine dependence: Secondary | ICD-10-CM | POA: Diagnosis not present

## 2016-07-24 DIAGNOSIS — I10 Essential (primary) hypertension: Secondary | ICD-10-CM

## 2016-07-24 DIAGNOSIS — Z79899 Other long term (current) drug therapy: Secondary | ICD-10-CM | POA: Diagnosis not present

## 2016-07-24 DIAGNOSIS — I951 Orthostatic hypotension: Secondary | ICD-10-CM

## 2016-07-24 DIAGNOSIS — Z8679 Personal history of other diseases of the circulatory system: Secondary | ICD-10-CM | POA: Diagnosis not present

## 2016-07-24 DIAGNOSIS — I950 Idiopathic hypotension: Secondary | ICD-10-CM | POA: Insufficient documentation

## 2016-07-24 DIAGNOSIS — R42 Dizziness and giddiness: Secondary | ICD-10-CM | POA: Diagnosis not present

## 2016-07-24 LAB — CBC WITH DIFFERENTIAL/PLATELET
Basophils Absolute: 0 10*3/uL (ref 0.0–0.1)
Basophils Relative: 0 %
Eosinophils Absolute: 0.5 10*3/uL (ref 0.0–0.7)
Eosinophils Relative: 3 %
HCT: 34 % — ABNORMAL LOW (ref 39.0–52.0)
Hemoglobin: 11.4 g/dL — ABNORMAL LOW (ref 13.0–17.0)
Lymphocytes Relative: 13 %
Lymphs Abs: 2.2 10*3/uL (ref 0.7–4.0)
MCH: 30.7 pg (ref 26.0–34.0)
MCHC: 33.5 g/dL (ref 30.0–36.0)
MCV: 91.6 fL (ref 78.0–100.0)
Monocytes Absolute: 0.9 10*3/uL (ref 0.1–1.0)
Monocytes Relative: 6 %
Neutro Abs: 13.2 10*3/uL — ABNORMAL HIGH (ref 1.7–7.7)
Neutrophils Relative %: 78 %
Platelets: 229 10*3/uL (ref 150–400)
RBC: 3.71 MIL/uL — ABNORMAL LOW (ref 4.22–5.81)
RDW: 14.8 % (ref 11.5–15.5)
WBC: 16.7 10*3/uL — ABNORMAL HIGH (ref 4.0–10.5)

## 2016-07-24 LAB — URINALYSIS, ROUTINE W REFLEX MICROSCOPIC
Bilirubin Urine: NEGATIVE
Glucose, UA: NEGATIVE mg/dL
Hgb urine dipstick: NEGATIVE
Ketones, ur: NEGATIVE mg/dL
Leukocytes, UA: NEGATIVE
Nitrite: NEGATIVE
Protein, ur: NEGATIVE mg/dL
Specific Gravity, Urine: 1.02 (ref 1.005–1.030)
pH: 5.5 (ref 5.0–8.0)

## 2016-07-24 LAB — BASIC METABOLIC PANEL
Anion gap: 7 (ref 5–15)
BUN: 33 mg/dL — ABNORMAL HIGH (ref 6–20)
CO2: 25 mmol/L (ref 22–32)
Calcium: 8.3 mg/dL — ABNORMAL LOW (ref 8.9–10.3)
Chloride: 102 mmol/L (ref 101–111)
Creatinine, Ser: 1.15 mg/dL (ref 0.61–1.24)
GFR calc Af Amer: >60 mL/min (ref >60)
GFR calc non Af Amer: >60 mL/min (ref >60)
Glucose, Bld: 88 mg/dL (ref 65–99)
Potassium: 4.2 mmol/L (ref 3.5–5.1)
Sodium: 134 mmol/L — ABNORMAL LOW (ref 135–145)

## 2016-07-24 LAB — I-STAT CG4 LACTIC ACID, ED
Lactic Acid, Venous: 1.97 mmol/L (ref 0.5–1.9)
Lactic Acid, Venous: 1.54 mmol/L (ref 0.5–1.9)

## 2016-07-24 MED ORDER — SODIUM CHLORIDE 0.9 % IV BOLUS (SEPSIS)
1000.0000 mL | Freq: Once | INTRAVENOUS | Status: AC
  Administered 2016-07-24: 1000 mL via INTRAVENOUS

## 2016-07-24 MED ORDER — SODIUM CHLORIDE 0.9 % IV BOLUS (SEPSIS)
1000.0000 mL | Freq: Once | INTRAVENOUS | Status: AC
Start: 2016-07-24 — End: 2016-07-24
  Administered 2016-07-24: 1000 mL via INTRAVENOUS

## 2016-07-24 NOTE — ED Triage Notes (Signed)
Pt sent from [facility], reports had back surgery last Wednesday and was discharged to the [facility] Monday.  Pt sent to er today for evaluation of hypotension.  Pt says they had to stop his surgery Wednesday because of hypotension but was able to finish it on Friday afternoon.  Reports was on a ventilator for 1 day.

## 2016-07-24 NOTE — Progress Notes (Signed)
Location:   [place] Room Number: 123/P Place of Service:  SNF [phone]) Provider:  Fernand Parkins, MD  Patient Care Team: Celene Squibb, MD as PCP - General (Internal Medicine)  Extended Emergency Contact Information Primary Emergency Contact: Lun,Patricia H Address: [address]          [place], [place] [address] Johnnette Litter of [place] Phone: [phone] Mobile Phone: [phone] Relation: Spouse Secondary Emergency Contact: Loreli Dollar [place] of [place] Phone: [phone] Relation: Sister  Code Status:  DNR Goals of care: Advanced Directive information Advanced Directives 07/24/2016  Does patient have an advance directive? Yes  Type of Advance Directive [place]  Does patient want to make changes to advanced directive? No - Patient declined  Copy of advanced directive(s) in chart? Yes  Would patient like information on creating an advanced directive? -  Pre-existing out of facility DNR order (yellow form or pink MOST form) -     Chief Complaint  Patient presents with  . Acute Visit    HTN   Acute visit secondary to continued hypotension HPI:  Pt is a 70 y.o. male seen today for an acute visit for continued hypotension.  Patient was seen 2 days ago for concerns of hypotension which appeared be orthostatic-I did review his medicines and took him off his Rapaflo which has a significant history of causing orthostatic hypotension.  He does have a history of bladder cancer he stated though however he did not at home takes this medication consistently.  His blood pressure continued to be low and Dr. Linna Darner did see him yesterday and made his Valium and Flexeril when necessary and decreased his DOB and as well as discontinued the hydrochlorothiazide portion of the medicine.  Today patient says he does not feel well he had an episode of dizziness apparently when he was upright-blood pressure was obtainable  he was lying in was 88/50 I checked this in both arms.  He does not complain of any chest pain shortness of breath says the dizziness has gotten a bit better but blood pressure still remains significantly low and he says he does not feel right and feels something is wrong.  Patient was hospitalized from July 19 to July 20 03/29/2016 for surgical treatment of lumbar spinal stenosis due to degenerative disc disease and a herniated nucleus pulposus at L3-L4 and L4-L5.  Patient did undergo laminectomy-but 3 hours after anesthesia was started blood pressure did drop to the [id number] systolic range.  He received aggressive treatment including crystalloid and 500 mL of albumin as well as vasopressin and epinephrine Benadryl and hydrocortisone.  Surgery was aborted and he was transferred to intensive care. And patient was required because of respiratory failure-films revealed a left lower lobe infiltrate and small left pleural effusion.  Neurosurgical procedure was completed on July 21 without complication.     Past Medical History:  Diagnosis Date  . Anxiety    takes Valium as needed  . Arthritis   . Bladder cancer ([facility])    takes Rapaflo daily  . Blood dyscrasia    07/08/16: pt told he was a "free bleeder" after bleeding a lot when derm cut off mole on forehead. No excessive bleeding with minor wounds at home, no bleeding problems perioperatively with prior surgeries.  . Chronic back pain    spondylolisthesis  . Cluster headaches   . Depression    takes Lexapro daily  . Essential hypertension, benign  takes Diovan-HCT daily  . Joint pain   . Obstructive sleep apnea on CPAP    uses CPAP @ night  . Pneumonia    "several times"--last time about 3-4 yrs ago  . PONV (postoperative nausea and vomiting)    Hx: of "sometimes"  . Prediabetes    Past Surgical History:  Procedure Laterality Date  . ANTERIOR CERVICAL DECOMP/DISCECTOMY FUSION N/A 06/05/2013   Procedure:  C3-4 Anterior  Cervical Discectomy and Fusion, Allograft, Plate;  Surgeon: [name], MD;  Location: [facility];  Service: Orthopedics;  Laterality: N/A;  C3-4 Anterior Cervical Discectomy and Fusion, Allograft, Plate  . Arthroscopic knee surgery    . BACK SURGERY      x 2  . BLADDER SURGERY     x 5 to remove tumor  . CARPAL TUNNEL RELEASE    . CATARACT EXTRACTION W/PHACO  12/19/2012   Procedure: CATARACT EXTRACTION PHACO AND INTRAOCULAR LENS PLACEMENT (IOC);  Surgeon: [name], MD;  Location: [facility];  Service: Ophthalmology;  Laterality: Left;  CDE: 26.80  . CATARACT EXTRACTION W/PHACO  01/09/2013   Procedure: CATARACT EXTRACTION PHACO AND INTRAOCULAR LENS PLACEMENT (IOC);  Surgeon: [name], MD;  Location: [facility];  Service: Ophthalmology;  Laterality: Right;  CDE:22.83  . CERVICAL FUSION  06/05/2013   C 3  C4  . CYSTOSCOPY    . REFRACTIVE SURGERY     Hx: of  . RETINAL DETACHMENT SURGERY    . TRANSURETHRAL RESECTION OF BLADDER TUMOR Right 07/27/2015   Procedure: TRANSURETHRAL RESECTION OF BLADDER TUMOR (TURBT);  Surgeon: [name], MD;  Location: [facility];  Service: Urology;  Laterality: Right;    Allergies  Allergen Reactions  . Sulfonamide Derivatives Itching, Rash and Other (See Comments)    whelps      Medication List       Accurate as of 07/24/16  4:48 PM. Always use your most recent med list.          acetaminophen 500 MG tablet Commonly known as:  TYLENOL Take 500 mg by mouth every 6 (six) hours as needed for mild pain or headache. For pain   Calcium Carbonate 500 MG Chew Take 1 tablet by mouth once a day.   cyclobenzaprine 10 MG tablet Commonly known as:  FLEXERIL Take 1 tablet (10 mg total) by mouth 3 (three) times daily as needed for muscle spasms.   diazepam 5 MG tablet Commonly known as:  VALIUM Take 1 tablet (5 mg total) by mouth 2 (two) times daily as needed for anxiety.   DIOVAN 160 MG tablet Generic drug:  valsartan Take 1 tablet (160 mg total) by mouth  daily.   escitalopram 10 MG tablet Commonly known as:  LEXAPRO Take 10 mg by mouth daily. Take along with 20 mg to equal 30 mg   escitalopram 20 MG tablet Commonly known as:  LEXAPRO Take 20 mg by mouth daily. Reported on 07/03/2016 take along with 10 mg to equal 30 mg   fluticasone 50 MCG/ACT nasal spray Commonly known as:  FLONASE Place 1 spray into both nostrils daily.   methylPREDNISolone 4 MG tablet Commonly known as:  MEDROL Take 4 mg by mouth day 1,2,3,4 once a day STOP DATE 07/25/16. TAKE 4 MG DAY 2,3,4,5,6 BEFORE BREAKFAST once a day STOP DATE 07/27/16. Take 4 mg by mouth day 3,4,5 at bedtime STOP DATE 07/26/16   MILK OF MAGNESIA PO Milk of magnesia 30 ml by mouth daily as needed for constipation may try  fleets enema.   multivitamin with minerals Tabs tablet Take 1 tablet by mouth daily.   omeprazole 20 MG capsule Commonly known as:  PRILOSEC Take 1 capsule (20 mg total) by mouth daily.   oxyCODONE 5 MG immediate release tablet Commonly known as:  Oxy IR/ROXICODONE Take 1 tablet (5 mg total) by mouth every 6 (six) hours as needed for severe pain.   sennosides-docusate sodium 8.6-50 MG tablet Commonly known as:  SENOKOT-S Take 1 tablet by mouth at bedtime.   TGT CALCIUM DIETARY SUPPLEMENT PO Take 1 tablet by mouth once a day  every other day       Review of Systems  In general is not complaining of fever or chills says he feels something is wrong.  Skin does not complain of rashes or itching at times has some diaphoresis.  Head ears eyes nose mouth and throat does not really complain of visual changes .  Respiratory is not complaining of shortness breath or cough.  Cardiac does not complain of chest pain.  GI is not complaining of nausea vomiting diarrhea-or abdominal discomfort  GU is not complaining of dysuria.  Musculoskeletal at this point does not complain of pain. But says he feels really weak  Neurologic not complaining of headache is  complaining of some dizziness He says is getting a little better-says whenever he stands up he experiences significant dizziness.     Psych he does have a history of anxiety and depression is followed by Dr. Harrington Challenger psychiatrist.   Immunization History  Administered Date(s) Administered  . Influenza-Unspecified 07/28/2014, 09/28/2015   Pertinent  Health Maintenance Due  Topic Date Due  . COLONOSCOPY  11/18/1996  . PNA vac Low Risk Adult (1 of 2 - PCV13) 11/19/2011  . INFLUENZA VACCINE  07/28/2016   No flowsheet data found. Functional Status Survey:    Vitals:   07/24/16 1626  BP: (!) 88/50  Pulse: 96  Resp: 20  Temp: 97.3 F (36.3 C)  TempSrc: Oral  Weight: 218 lb 12.8 oz (99.2 kg)  Height: 6' (1.829 m)   Body mass index is 29.67 kg/m. Physical Exam   In general this is a somewhat frail appearing middle-aged male in mild discomfort lying in bed says he gets dizzy if he tries to arise up  His skin is warm and slightly diaphoretic.  Eyes pupils appear reactive light visual acuity appears grossly intact.  Oropharynx is clear mucous membranes fairly moist.  Chest is clear to auscultation there is no labored breathing.  Heart is regular rate and rhythm with an occasional irregular beat-he does not really have significant lower extremity edema.  Abdomen is soft nontender with positive bowel sounds.  Musculoskeletal is able to move all extremities strength appears to be grossly intact.  Neurologic is grossly intact cranial nerves intact to speech is clear no lateralizing findings. Says with any attempt of raising his head however he gets dizzy  Psych he is alert and oriented  anxious. Labs reviewed:  Recent Labs  07/15/16 1527 07/16/16 0400 07/17/16 0605 07/17/16 1802 07/22/16 0700 07/24/16 1611  NA 137 138 141 144 136 134*  K 3.1* 4.1 4.2 4.1 3.6 4.2  CL 105 106 107  --  103 102  CO2 27 26 28   --  28 25  GLUCOSE 177* 138* 150* 138* 90 88  BUN 9  8 15   --  28* 33*  CREATININE 1.15 1.03 1.08  --  0.89 1.15  CALCIUM 7.8* 8.1* 8.1*  --  7.8* 8.3*  MG 1.3* 1.4*  --   --   --   --   PHOS 3.1 2.6  --   --   --   --     Recent Labs  07/16/16 0400  ALBUMIN 3.0*    Recent Labs  07/15/16 1527 07/16/16 0400 07/17/16 0605 07/17/16 1802 07/22/16 0700 07/24/16 1611  WBC 20.6* 17.2* 18.2*  --  14.2* 16.7*  NEUTROABS 19.5* 16.0*  --   --   --  13.2*  HGB 11.1* 10.8* 10.1* 9.2* 11.3* 11.4*  HCT 34.1* 32.7* 30.6* 27.0* 33.7* 34.0*  MCV 89.3 88.4 88.4  --  91.1 91.6  PLT 164 153 179  --  197 229   No results found for: TSH Lab Results  Component Value Date   HGBA1C 5.8 (H) 07/22/2016   No results found for: CHOL, HDL, LDLCALC, LDLDIRECT, TRIG, CHOLHDL  Significant Diagnostic Results in last 30 days:  Dg Lumbar Spine 2-3 Views  Result Date: 07/17/2016 CLINICAL DATA:  Patient status post cage and pedicle screw implantation. EXAM: DG C-ARM 1-60 MIN-NO REPORT; LUMBAR SPINE - 2-3 VIEW; DG C-ARM 61-120 MIN COMPARISON:  Lumbar spine MRI 06/12/2016. FINDINGS: 2 intraoperative fluoroscopic images were submitted for interpretation. Intervertebral body spacers are demonstrated at the L3-4 and L4-5 level. Transpedicular screws are present at the L3, L4 and L5 level. IMPRESSION: Two intraoperative fluoroscopic images as above demonstrating L3-L5 intervertebral body spacer and transpedicular screw placement. Electronically Signed   By: Lovey Newcomer M.D.   On: 07/17/2016 18:53   Dg Wrist Complete Left  Result Date: 07/02/2016 3 views left wrist left wrist fracture follow-up X-ray shows minor degenerative changes in the radius cyst formations fracture line well healed well Impression healed fracture minimal if any displacement  Dg Chest Port 1 View  Result Date: 07/16/2016 CLINICAL DATA:  Respiratory failure. EXAM: PORTABLE CHEST 1 VIEW COMPARISON:  07/15/2016.  05/17/2016. FINDINGS: Endotracheal tube tip 4 cm above the carina. Mediastinum and hilar  structures normal. Heart size stable. Low lung volumes with bibasilar atelectasis. Unchanged persist left lower lobe infiltrate. Small left pleural effusion cannot be excluded. No pneumothorax. IMPRESSION: 1.  Endotracheal tube tip 4 cm above the carina. 2. Low lung volumes with persistent bibasilar atelectasis. Persistent unchanged left lower lobe infiltrate. Small left pleural effusion. Electronically Signed   By: Marcello Moores  Register   On: 07/16/2016 07:21   Dg Chest Port 1 View  Result Date: 07/15/2016 CLINICAL DATA:  Hypotensive event during surgery; hypoxia EXAM: PORTABLE CHEST 1 VIEW COMPARISON:  May 17, 2016 FINDINGS: Endotracheal tube tip is 5.2 cm above the carina. No pneumothorax. There is a focal area of airspace consolidation in the left base. Lungs elsewhere clear. Heart is upper normal in size with pulmonary vascularity within normal limits. No adenopathy. No bone lesions. IMPRESSION: Endotracheal tube as described without pneumothorax. Focal airspace consolidation left base. Question pneumonia versus aspiration as most likely etiologies for this opacity. Lungs elsewhere clear. Stable cardiac silhouette. Electronically Signed   By: Lowella Grip III M.D.   On: 07/15/2016 15:29   Dg C-arm 1-60 Min  Result Date: 07/17/2016 CLINICAL DATA:  Patient status post cage and pedicle screw implantation. EXAM: DG C-ARM 1-60 MIN-NO REPORT; LUMBAR SPINE - 2-3 VIEW; DG C-ARM 61-120 MIN COMPARISON:  Lumbar spine MRI 06/12/2016. FINDINGS: 2 intraoperative fluoroscopic images were submitted for interpretation. Intervertebral body spacers are demonstrated at the L3-4 and L4-5 level. Transpedicular screws are present at the L3, L4 and L5  level. IMPRESSION: Two intraoperative fluoroscopic images as above demonstrating L3-L5 intervertebral body spacer and transpedicular screw placement. Electronically Signed   By: Lovey Newcomer M.D.   On: 07/17/2016 18:53   Dg C-arm 1-60 Min-no Report  Result Date:  07/15/2016 CLINICAL DATA: L3-5 PLIF C-ARM 1-60 MINUTES Fluoroscopy was utilized by the requesting physician.  No radiographic interpretation.   Ct Angio Chest/abd/pel For Dissection W And/or W/wo  Result Date: 07/15/2016 CLINICAL DATA:  Acute onset of hypotension and tachycardia during lumbar spine fusion. Evaluate for pulmonary embolism, vascular injury and retroperitoneal hematoma. EXAM: CT ANGIOGRAPHY CHEST, ABDOMEN AND PELVIS TECHNIQUE: Multidetector CT imaging through the chest, abdomen and pelvis was performed using the standard protocol during bolus administration of intravenous contrast. Multiplanar reconstructed images and MIPs were obtained and reviewed to evaluate the vascular anatomy. CONTRAST:  100 ml Isovue 370. COMPARISON:  Abdominal pelvic CT 12/26/2015 and 07/31/2015. FINDINGS: CTA CHEST FINDINGS Cardiovascular: Pre contrast images demonstrate no displaced intimal calcifications within the thoracic aorta. There is atherosclerosis of the aorta and coronary arteries. Post-contrast, there is adequate opacification of the pulmonary arteries and systemic arteries. No evidence of acute pulmonary embolism. No evidence of aortic aneurysm or dissection. The heart size is normal. There is no pericardial effusion. Mediastinum/Nodes: There are no enlarged mediastinal, hilar or axillary lymph nodes. Small hiatal hernia. Lungs/Pleura: There is no pleural effusion. There are dependent confluent airspace opacities in both lower lobes with associated volume loss and air bronchograms. Appearance is suspicious for possible aspiration. There is no endobronchial lesion. Endotracheal tube tip is in the midtrachea. Musculoskeletal/Chest wall: There is no chest wall mass or suspicious osseous finding. Review of the MIP images confirms the above findings. CTA ABDOMEN AND PELVIS FINDINGS Vascular: Compared with the prior CT, there is mildly percent dilatation of the right common iliac artery which measures up to 1.9 cm  on coronal image 67 of series 504. There is mild associated intimal irregularity. The abdominal aorta is normal in caliber. There is no evidence of aortic dissection or retroperitoneal hematoma. There is no evidence of branch vessel injury or large vessel occlusion. There is a small accessory renal artery on the right. Atherosclerosis of the aorta and its branch vessels is noted. Venous opacification is limited without apparent acute injury. Hepatobiliary: The liver is normal in density without focal abnormality. No evidence of gallstones, gallbladder wall thickening or biliary dilatation. Pancreas: Unremarkable. No pancreatic ductal dilatation or surrounding inflammatory changes. Spleen: Normal in size without focal abnormality. Stable small splenule. Adrenals/Urinary Tract: Both adrenal glands appear normal. Both kidneys demonstrate mild cortical thinning and small low-density lesions, grossly stable and likely cysts. There are possible small renal caliceal calculi bilaterally. There is no evidence of hydronephrosis or ureteral calculus. However, there is possible wall thickening of the left renal pelvis with adjacent perinephric soft tissue stranding which may be mildly progressive compared with prior CTs. The left ureter appears normal in caliber. The urinary bladder is partially decompressed by a Foley catheter and is suboptimally evaluated. Stomach/Bowel: No evidence of bowel wall thickening, distention or surrounding inflammatory change. Mild sigmoid diverticulosis. The appendix appears normal. Vascular/Lymphatic: Vascular findings described above. There are no enlarged abdominal pelvic lymph nodes. Reproductive: The prostate gland appears unremarkable. Other: Stable prominent fat in both inguinal canals. Stable small umbilical hernia containing only fat. Musculoskeletal: No acute or significant osseous findings. There are postsurgical changes status post laminectomy and interbody fusion at L3-4 and L4-5.  Surgical drain is in place. No unexpected fluid collections  are identified. Review of the MIP images confirms the above findings. IMPRESSION: 1. No evidence of acute pulmonary embolism. 2. Dependent airspace opacities with air bronchograms in both lung bases suspicious for possible aspiration. 3. No evidence of retroperitoneal hematoma or acute vascular injury. 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 4. Expected postsurgical changes related to recent L3-4 and L4-5 posterior decompression and interbody fusion. 5. Nonobstructing bilateral renal calculi. Possible progressive wall thickening of the left renal pelvis and adjacent perinephric soft tissue stranding. Given the patient's history of bladder cancer, urology follow-up recommended. Follow up CT to include pre contrast and delayed post-contrast imaging may be helpful to exclude a developing urothelial lesion. 6. The study was reviewed as it was being performed on 07/15/2016 at 1:50 pm with Dr. Kary Kos . Electronically Signed   By: Richardean Sale M.D.   On: 07/15/2016 14:35    Assessment/Plan  Continued hypotension-with some mild distress-patient feels that something is going "wrong"-I did do serial exams -blood pressure was rechecked and  was under 90 systolically in both arms. Despite continuing to lie in bed Clinically he does not appear to be unstable but he is quite concerned about the dizziness  Neurologically he appears to be intact per serial exams  He is not complaining of any chest pain or increased shortness of breath otherwise vital signs appear stable however he is quite adamant that whenever he rises up he gets quite dizzy.  He is quite anxious about this and feels there is something else going wrong here.    I did see him earlier this week and did an EKG which showed normal sinus rhythm with a right bundle branch block which was not new.  Patient's status does not really appear to be improving his blood pressure does not  appear to be improving despite lying in bed and serial rechecks-at this point we will sent to the ER per patient's concerns.  [place], [place], [place]

## 2016-07-24 NOTE — ED Provider Notes (Signed)
[facility] DEPT Provider Note   CSN: [id number] Arrival date & time: 07/24/16  1457  First Provider Contact:  First MD Initiated Contact with Patient 07/24/16 1610        History   Chief Complaint Chief Complaint  Patient presents with  . Hypotension    HPI Zachary [name] is a 70 y.o. male.  HPI   70 year old male with persistent hypotension. Patient had lumbar surgery last Wednesday. Initial surgery had to be aborted after patient had a severe episode of hypotension (Anesthesia note copied below). It was subsequently completed 2 days later. Patient has had continued hypertension since discharge though. His medications have been changed including discontinuing Rapaflo and switching Diovan-HCTZ to valsartan only. He was prescribed oxycodone, Valium and Flexeril for pain after surgery. He reports that his symptoms do not seem to clearly correlate with administration of these medicines though. They're relatively constant throughout the day. He has dizziness at rest which seems to be exacerbated with change in position and with exertion. His back pain has progressively been improving since his surgery. Subjective fever at times. No cough. No shortness of breath. No urinary complaints. He reports that his appetite has been good. No overt bleeding.   "Patient had intraop event (see anesthesia record) of immediate and profound hypotension with tachycardia, no EKG signs of ischemia, that resolved in 15 minutes with return to supine position, epinephrine, hydrocortisone, benadryl, vasopressin and volume loading.. Likely diagnosis at this time with negative CT abdomen and chest is pulmonary air embolism that resolved with volume, inotropy support and position change, also possible anaphylactic/anaphylactoid response to possibly cell saver.. Cardiac enzymes pending and post op EKG pending. ICU critical care is following now and will continue work up.. Zachary [name] can likely be extubated tonight he is  doing so well, off phenylephrine now, SR at 28, with minimal need for vent support."   Past Medical History:  Diagnosis Date  . Anxiety    takes Valium as needed  . Arthritis   . Bladder cancer ([place])    takes Rapaflo daily  . Blood dyscrasia    07/08/16: pt told he was a "free bleeder" after bleeding a lot when derm cut off mole on forehead. No excessive bleeding with minor wounds at home, no bleeding problems perioperatively with prior surgeries.  . Chronic back pain    spondylolisthesis  . Cluster headaches   . Depression    takes Lexapro daily  . Essential hypertension, benign    takes Diovan-HCT daily  . Joint pain   . Obstructive sleep apnea on CPAP    uses CPAP @ night  . Pneumonia    "several times"--last time about 3-4 yrs ago  . PONV (postoperative nausea and vomiting)    Hx: of "sometimes"  . Prediabetes     Patient Active Problem List   Diagnosis Date Noted  . Postural hypotension 07/23/2016  . Dysphagia 07/23/2016  . Obstructive sleep apnea 07/21/2016  . Hyperglycemia 07/21/2016  . Anemia, unspecified 07/21/2016  . Spinal stenosis of lumbar region 07/15/2016  . Bladder tumor 07/27/2015  . HNP (herniated nucleus pulposus), cervical 06/05/2013    Class: Diagnosis of  . 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  . Muscle spasms of neck 04/04/2013  . 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 OA (osteoarthritis) of knee 02/22/2013  . Iliotibial band syndrome of left side 11/22/2012  . Localized, primary osteoarthritis of the ankle and foot 10/11/2012  . Difficulty in walking(719.7) 08/17/2012  . Peroneal tendinitis  08/16/2012  . Precordial pain 02/09/2012  . Essential hypertension, benign 02/09/2012  . Mixed hyperlipidemia 02/09/2012  . Dysthymia 02/04/2012  . Abnormality of gait 12/23/2011  . Lateral epicondylitis/tennis elbow 05/06/2011  . TENOSYNOVITIS OF FOOT AND ANKLE 10/22/2010    Past Surgical History:  Procedure Laterality Date  . ANTERIOR  CERVICAL DECOMP/DISCECTOMY FUSION N/A 06/05/2013   Procedure:  C3-4 Anterior Cervical Discectomy and Fusion, Allograft, Plate;  Surgeon: [name], MD;  Location: [facility];  Service: Orthopedics;  Laterality: N/A;  C3-4 Anterior Cervical Discectomy and Fusion, Allograft, Plate  . Arthroscopic knee surgery    . BACK SURGERY      x 2  . BLADDER SURGERY     x 5 to remove tumor  . CARPAL TUNNEL RELEASE    . CATARACT EXTRACTION W/PHACO  12/19/2012   Procedure: CATARACT EXTRACTION PHACO AND INTRAOCULAR LENS PLACEMENT (IOC);  Surgeon: [name], MD;  Location: [facility];  Service: Ophthalmology;  Laterality: Left;  CDE: 26.80  . CATARACT EXTRACTION W/PHACO  01/09/2013   Procedure: CATARACT EXTRACTION PHACO AND INTRAOCULAR LENS PLACEMENT (IOC);  Surgeon: [name], MD;  Location: [facility];  Service: Ophthalmology;  Laterality: Right;  CDE:22.83  . CERVICAL FUSION  06/05/2013   C 3  C4  . CYSTOSCOPY    . REFRACTIVE SURGERY     Hx: of  . RETINAL DETACHMENT SURGERY    . TRANSURETHRAL RESECTION OF BLADDER TUMOR Right 07/27/2015   Procedure: TRANSURETHRAL RESECTION OF BLADDER TUMOR (TURBT);  Surgeon: [name], MD;  Location: [facility];  Service: Urology;  Laterality: Right;       Home Medications    Prior to Admission medications   Medication Sig Start Date End Date Taking? Authorizing Provider  acetaminophen (TYLENOL) 500 MG tablet Take 500 mg by mouth every 6 (six) hours as needed for mild pain or headache. For pain    Yes Historical Provider, MD  calcium carbonate (OS-CAL - DOSED IN MG OF ELEMENTAL CALCIUM) 1250 (500 Ca) MG tablet Take 1 tablet by mouth daily with breakfast.   Yes Historical Provider, MD  cyclobenzaprine (FLEXERIL) 10 MG tablet Take 1 tablet (10 mg total) by mouth 3 (three) times daily as needed for muscle spasms. 07/20/16  Yes Kevan Ny Ditty, MD  diazepam (VALIUM) 5 MG tablet Take 1 tablet (5 mg total) by mouth 2 (two) times daily as needed for anxiety. 07/20/16  Yes  Lauree Chandler, NP  escitalopram (LEXAPRO) 10 MG tablet Take 10 mg by mouth daily. Take along with 20 mg to equal 30 mg   Yes Historical Provider, MD  escitalopram (LEXAPRO) 20 MG tablet Take 20 mg by mouth daily. Reported on 07/03/2016 take along with 10 mg to equal 30 mg 05/29/16  Yes Historical Provider, MD  fluticasone (FLONASE) 50 MCG/ACT nasal spray Place 1 spray into both nostrils daily.   Yes Historical Provider, MD  Magnesium Hydroxide (MILK OF MAGNESIA PO) Milk of magnesia 30 ml by mouth daily as needed for constipation may try fleets enema.   Yes Historical Provider, MD  Multiple Vitamin (MULTIVITAMIN WITH MINERALS) TABS tablet Take 1 tablet by mouth daily.   Yes Historical Provider, MD  omeprazole (PRILOSEC) 20 MG capsule Take 1 capsule (20 mg total) by mouth daily. 07/23/16  Yes Hendricks Limes, MD  oxyCODONE (OXY IR/ROXICODONE) 5 MG immediate release tablet Take 1 tablet (5 mg total) by mouth every 6 (six) hours as needed for severe pain. 07/20/16  Yes Lauree Chandler,  NP  sennosides-docusate sodium (SENOKOT-S) 8.6-50 MG tablet Take 1 tablet by mouth at bedtime.   Yes Historical Provider, MD  valsartan (DIOVAN) 160 MG tablet Take 1 tablet (160 mg total) by mouth daily. 07/23/16  Yes Hendricks Limes, MD    Family History Family History  Problem Relation Age of Onset  . Hypertension Father   . Cancer Father     Prostate  . Depression Mother   . Hypertension Mother   . Alcohol abuse Mother   . Hypertension Sister   . Anxiety disorder Sister   . Alcohol abuse Maternal Grandfather   . ADD / ADHD Neg Hx   . Bipolar disorder Neg Hx   . Dementia Neg Hx   . Drug abuse Neg Hx   . OCD Neg Hx   . Paranoid behavior Neg Hx   . Schizophrenia Neg Hx   . Seizures Neg Hx   . Sexual abuse Neg Hx   . Physical abuse Neg Hx     Social History Social History  Substance Use Topics  . Smoking status: Former Research scientist (life sciences)  . Smokeless tobacco: Never Used     Comment: 1985  . Alcohol use No      Allergies   Sulfonamide derivatives   Review of Systems Review of Systems  All systems reviewed and negative, other than as noted in HPI.   Physical Exam Updated Vital Signs BP 110/57 (BP Location: Left Arm)   Pulse 91   Temp 97.6 F (36.4 C) (Oral)   Resp 18   Ht 6' (1.829 m)   Wt 226 lb (102.5 kg)   SpO2 94%   BMI 30.65 kg/m   Physical Exam  Constitutional: He appears well-developed and well-nourished.  HENT:  Head: Normocephalic and atraumatic.  Eyes: Conjunctivae are normal.  Neck: Neck supple.  Cardiovascular: Normal rate and regular rhythm.   No murmur heard. Pulmonary/Chest: Effort normal and breath sounds normal. No respiratory distress.  Abdominal: Soft. There is no tenderness.  Musculoskeletal: He exhibits no edema.  Midline lumbar incision looks appropriate given timing of recent procedure. Steri-Strips in place.  Neurological: He is alert. He exhibits normal muscle tone.  Strength and sensation normal in bilateral lower extremities.  Skin: Skin is warm and dry.  Psychiatric: He has a normal mood and affect.  Nursing note and vitals reviewed.    ED Treatments / Results  Labs (all labs ordered are listed, but only abnormal results are displayed) Labs Reviewed  BASIC METABOLIC PANEL - Abnormal; Notable for the following:       Result Value   Sodium 134 (*)    BUN 33 (*)    Calcium 8.3 (*)    All other components within normal limits  CBC WITH DIFFERENTIAL/PLATELET - Abnormal; Notable for the following:    WBC 16.7 (*)    RBC 3.71 (*)    Hemoglobin 11.4 (*)    HCT 34.0 (*)    Neutro Abs 13.2 (*)    All other components within normal limits  I-STAT CG4 LACTIC ACID, ED - Abnormal; Notable for the following:    Lactic Acid, Venous 1.97 (*)    All other components within normal limits  URINALYSIS, ROUTINE W REFLEX MICROSCOPIC (NOT AT [facility])    EKG  EKG Interpretation  Date/Time:  Friday July 24 2016 15:06:09 EDT Ventricular Rate:   94 PR Interval:    QRS Duration: 143 QT Interval:  373 QTC Calculation: 467 R Axis:   28 Text Interpretation:  Sinus rhythm Right bundle branch block Inferior infarct, old Lateral leads are also involved Confirmed by Wilson Singer  MD, [place] [phone]) on 07/24/2016 4:08:26 PM       Radiology No results found.  Procedures Procedures (including critical care time)  Medications Ordered in ED Medications  sodium chloride 0.9 % bolus 1,000 mL (not administered)     Initial Impression / Assessment and Plan / ED Course  I have reviewed the triage vital signs and the nursing notes.  Pertinent labs & imaging results that were available during my care of the patient were reviewed by me and considered in my medical decision making (see chart for details).  Clinical Course   70 year old male with continued hypotension status post lumbar surgery last week. Unclear as to exact etiology. On exam he appears somewhat tired, but not toxic. He is afebrile. Mild hypertension noted. Heart rate in the 90s. EKG is not overtly changed from priors. Will place an IV. We'll give IV fluids. Will check basic labs and urinalysis and reassess.  Final Clinical Impressions(s) / ED Diagnoses   Final diagnoses:  Idiopathic hypotension    New Prescriptions New Prescriptions   No medications on file   .    Virgel Manifold, MD 08/03/16 1201

## 2016-07-24 NOTE — ED Notes (Signed)
Pt ambulated with minimal assistance.

## 2016-07-24 NOTE — ED Notes (Signed)
Pt rectal temp was 97.6

## 2016-07-24 NOTE — ED Notes (Signed)
[facility] reported that bp was as high as 129/62 down to 88/50 today, reports "not feeling right.  [facility] staff reported that they changed his divon/hctz medication to just divon 160mg .  They also stopped rapaflo 8mg  on the 26th of July.

## 2016-07-27 ENCOUNTER — Encounter: Payer: Self-pay | Admitting: Internal Medicine

## 2016-07-27 ENCOUNTER — Non-Acute Institutional Stay (SKILLED_NURSING_FACILITY): Payer: Medicare Other | Admitting: Internal Medicine

## 2016-07-27 DIAGNOSIS — I10 Essential (primary) hypertension: Secondary | ICD-10-CM | POA: Diagnosis not present

## 2016-07-27 DIAGNOSIS — I951 Orthostatic hypotension: Secondary | ICD-10-CM | POA: Diagnosis not present

## 2016-07-27 NOTE — Assessment & Plan Note (Addendum)
Patient BP is WNL. He is having orthostatic Bp. Will hold Diovan right now. Continue to monitor BP.

## 2016-07-27 NOTE — Assessment & Plan Note (Addendum)
Patient continues to have Postural Hypotension.  Would hold his Diovan. Continue to monitor Bp. Encourage fluid intake.

## 2016-07-27 NOTE — Progress Notes (Signed)
Location:   [facility] of Service:   SNF Provider:  Darci Current, MD  Patient Care Team: Celene Squibb, MD as PCP - General (Internal Medicine)  Extended Emergency Contact Information Primary Emergency Contact: Mellor,Patricia H Address: [address]          [place], [place] [address] Johnnette Litter of [place] Phone: [phone] Mobile Phone: [phone] Relation: Spouse Secondary Emergency Contact: Loreli [place] of [place] Phone: [phone] Relation: Sister  Code Status:  DNR Goals of care: Advanced Directive information Advanced Directives 07/24/2016  Does patient have an advance directive? Yes  Type of Advance Directive [place]  Does patient want to make changes to advanced directive? No - Patient declined  Copy of advanced directive(s) in chart? Yes  Would patient like information on creating an advanced directive? -  Pre-existing out of facility DNR order (yellow form or pink MOST form) -     No chief complaint on file.   HPI:  Pt is a 70 y.o. male seen today for an acute visit for follow up from ED visit. Patient was feeling weak and had orthostatic Hypotension in the facility. He was send to ED for evaluation. There his labs were normal with no signs of infection. He was discharged after hydration. Patient feels much better but was very tired as he had just come from the therapy. He still feels dizzy when he stands up. No H/O fever chills. No chest pain or SOB. No nausea or vomiting. No H/o Blood in stools or diarrhea. His appetite is good and he is eating well.   Past Medical History:  Diagnosis Date  . Anxiety    takes Valium as needed  . Arthritis   . Bladder cancer ([place])    takes Rapaflo daily  . Blood dyscrasia    07/08/16: pt told he was a "free bleeder" after bleeding a lot when derm cut off mole on forehead. No excessive bleeding with minor wounds at home, no bleeding problems  perioperatively with prior surgeries.  . Chronic back pain    spondylolisthesis  . Cluster headaches   . Depression    takes Lexapro daily  . Essential hypertension, benign    takes Diovan-HCT daily  . Joint pain   . Obstructive sleep apnea on CPAP    uses CPAP @ night  . Pneumonia    "several times"--last time about 3-4 yrs ago  . PONV (postoperative nausea and vomiting)    Hx: of "sometimes"  . Prediabetes    Past Surgical History:  Procedure Laterality Date  . ANTERIOR CERVICAL DECOMP/DISCECTOMY FUSION N/A 06/05/2013   Procedure:  C3-4 Anterior Cervical Discectomy and Fusion, Allograft, Plate;  Surgeon: [name], MD;  Location: [facility];  Service: Orthopedics;  Laterality: N/A;  C3-4 Anterior Cervical Discectomy and Fusion, Allograft, Plate  . Arthroscopic knee surgery    . BACK SURGERY      x 2  . BLADDER SURGERY     x 5 to remove tumor  . CARPAL TUNNEL RELEASE    . CATARACT EXTRACTION W/PHACO  12/19/2012   Procedure: CATARACT EXTRACTION PHACO AND INTRAOCULAR LENS PLACEMENT (IOC);  Surgeon: [name], MD;  Location: [facility];  Service: Ophthalmology;  Laterality: Left;  CDE: 26.80  . CATARACT EXTRACTION W/PHACO  01/09/2013   Procedure: CATARACT EXTRACTION PHACO AND INTRAOCULAR LENS PLACEMENT (IOC);  Surgeon: [name], MD;  Location: [facility];  Service: Ophthalmology;  Laterality:  Right;  CDE:22.83  . CERVICAL FUSION  06/05/2013   C 3  C4  . CYSTOSCOPY    . REFRACTIVE SURGERY     Hx: of  . RETINAL DETACHMENT SURGERY    . TRANSURETHRAL RESECTION OF BLADDER TUMOR Right 07/27/2015   Procedure: TRANSURETHRAL RESECTION OF BLADDER TUMOR (TURBT);  Surgeon: [name], MD;  Location: [facility];  Service: Urology;  Laterality: Right;    Allergies  Allergen Reactions  . Sulfonamide Derivatives Itching, Rash and Other (See Comments)    whelps      Medication List    Notice   This visit is during an admission. Changes to the med list made in this visit will be reflected in  the After Visit Summary of the admission.     Review of Systems  Constitutional: Positive for fatigue. Negative for chills and fever.  HENT: Negative.   Eyes: Negative.   Respiratory: Negative.   Cardiovascular: Negative for chest pain, palpitations and leg swelling.  Gastrointestinal: Negative for abdominal distention and abdominal pain.  Neurological: Positive for weakness and light-headedness. Negative for tremors, syncope and speech difficulty.    Immunization History  Administered Date(s) Administered  . Influenza-Unspecified 07/28/2014, 09/28/2015   Pertinent  Health Maintenance Due  Topic Date Due  . COLONOSCOPY  11/18/1996  . PNA vac Low Risk Adult (1 of 2 - PCV13) 11/19/2011  . INFLUENZA VACCINE  07/28/2016   No flowsheet data found. Functional Status Survey:    There were no vitals filed for this visit. There is no height or weight on file to calculate BMI. Physical Exam  Constitutional: He appears well-developed and well-nourished.  HENT:  Head: Normocephalic.  Neck: Neck supple.  Cardiovascular: Normal rate.   Pulmonary/Chest: Breath sounds normal.  Abdominal: Bowel sounds are normal.  Musculoskeletal: He exhibits no edema.    Labs reviewed:  Recent Labs  07/15/16 1527 07/16/16 0400 07/17/16 0605 07/17/16 1802 07/22/16 0700 07/24/16 1611  NA 137 138 141 144 136 134*  K 3.1* 4.1 4.2 4.1 3.6 4.2  CL 105 106 107  --  103 102  CO2 27 26 28   --  28 25  GLUCOSE 177* 138* 150* 138* 90 88  BUN 9 8 15   --  28* 33*  CREATININE 1.15 1.03 1.08  --  0.89 1.15  CALCIUM 7.8* 8.1* 8.1*  --  7.8* 8.3*  MG 1.3* 1.4*  --   --   --   --   PHOS 3.1 2.6  --   --   --   --     Recent Labs  07/16/16 0400  ALBUMIN 3.0*    Recent Labs  07/15/16 1527 07/16/16 0400 07/17/16 0605 07/17/16 1802 07/22/16 0700 07/24/16 1611  WBC 20.6* 17.2* 18.2*  --  14.2* 16.7*  NEUTROABS 19.5* 16.0*  --   --   --  13.2*  HGB 11.1* 10.8* 10.1* 9.2* 11.3* 11.4*  HCT 34.1*  32.7* 30.6* 27.0* 33.7* 34.0*  MCV 89.3 88.4 88.4  --  91.1 91.6  PLT 164 153 179  --  197 229   No results found for: TSH Lab Results  Component Value Date   HGBA1C 5.8 (H) 07/22/2016   No results found for: CHOL, HDL, LDLCALC, LDLDIRECT, TRIG, CHOLHDL  Significant Diagnostic Results in last 30 days:  Dg Lumbar Spine 2-3 Views  Result Date: 07/17/2016 CLINICAL DATA:  Patient status post cage and pedicle screw implantation. EXAM: DG C-ARM 1-60 MIN-NO REPORT; LUMBAR SPINE - 2-3  VIEW; DG C-ARM 61-120 MIN COMPARISON:  Lumbar spine MRI 06/12/2016. FINDINGS: 2 intraoperative fluoroscopic images were submitted for interpretation. Intervertebral body spacers are demonstrated at the L3-4 and L4-5 level. Transpedicular screws are present at the L3, L4 and L5 level. IMPRESSION: Two intraoperative fluoroscopic images as above demonstrating L3-L5 intervertebral body spacer and transpedicular screw placement. Electronically Signed   By: Lovey Newcomer M.D.   On: 07/17/2016 18:53   Dg Wrist Complete Left  Result Date: 07/02/2016 3 views left wrist left wrist fracture follow-up X-ray shows minor degenerative changes in the radius cyst formations fracture line well healed well Impression healed fracture minimal if any displacement  Dg Chest Port 1 View  Result Date: 07/16/2016 CLINICAL DATA:  Respiratory failure. EXAM: PORTABLE CHEST 1 VIEW COMPARISON:  07/15/2016.  05/17/2016. FINDINGS: Endotracheal tube tip 4 cm above the carina. Mediastinum and hilar structures normal. Heart size stable. Low lung volumes with bibasilar atelectasis. Unchanged persist left lower lobe infiltrate. Small left pleural effusion cannot be excluded. No pneumothorax. IMPRESSION: 1.  Endotracheal tube tip 4 cm above the carina. 2. Low lung volumes with persistent bibasilar atelectasis. Persistent unchanged left lower lobe infiltrate. Small left pleural effusion. Electronically Signed   By: Marcello Moores  Register   On: 07/16/2016 07:21   Dg  Chest Port 1 View  Result Date: 07/15/2016 CLINICAL DATA:  Hypotensive event during surgery; hypoxia EXAM: PORTABLE CHEST 1 VIEW COMPARISON:  May 17, 2016 FINDINGS: Endotracheal tube tip is 5.2 cm above the carina. No pneumothorax. There is a focal area of airspace consolidation in the left base. Lungs elsewhere clear. Heart is upper normal in size with pulmonary vascularity within normal limits. No adenopathy. No bone lesions. IMPRESSION: Endotracheal tube as described without pneumothorax. Focal airspace consolidation left base. Question pneumonia versus aspiration as most likely etiologies for this opacity. Lungs elsewhere clear. Stable cardiac silhouette. Electronically Signed   By: Lowella Grip III M.D.   On: 07/15/2016 15:29   Dg C-arm 1-60 Min  Result Date: 07/17/2016 CLINICAL DATA:  Patient status post cage and pedicle screw implantation. EXAM: DG C-ARM 1-60 MIN-NO REPORT; LUMBAR SPINE - 2-3 VIEW; DG C-ARM 61-120 MIN COMPARISON:  Lumbar spine MRI 06/12/2016. FINDINGS: 2 intraoperative fluoroscopic images were submitted for interpretation. Intervertebral body spacers are demonstrated at the L3-4 and L4-5 level. Transpedicular screws are present at the L3, L4 and L5 level. IMPRESSION: Two intraoperative fluoroscopic images as above demonstrating L3-L5 intervertebral body spacer and transpedicular screw placement. Electronically Signed   By: Lovey Newcomer M.D.   On: 07/17/2016 18:53   Dg C-arm 1-60 Min-no Report  Result Date: 07/15/2016 CLINICAL DATA: L3-5 PLIF C-ARM 1-60 MINUTES Fluoroscopy was utilized by the requesting physician.  No radiographic interpretation.   Ct Angio Chest/abd/pel For Dissection W And/or W/wo  Result Date: 07/15/2016 CLINICAL DATA:  Acute onset of hypotension and tachycardia during lumbar spine fusion. Evaluate for pulmonary embolism, vascular injury and retroperitoneal hematoma. EXAM: CT ANGIOGRAPHY CHEST, ABDOMEN AND PELVIS TECHNIQUE: Multidetector CT imaging through  the chest, abdomen and pelvis was performed using the standard protocol during bolus administration of intravenous contrast. Multiplanar reconstructed images and MIPs were obtained and reviewed to evaluate the vascular anatomy. CONTRAST:  100 ml Isovue 370. COMPARISON:  Abdominal pelvic CT 12/26/2015 and 07/31/2015. FINDINGS: CTA CHEST FINDINGS Cardiovascular: Pre contrast images demonstrate no displaced intimal calcifications within the thoracic aorta. There is atherosclerosis of the aorta and coronary arteries. Post-contrast, there is adequate opacification of the pulmonary arteries and systemic arteries. No  evidence of acute pulmonary embolism. No evidence of aortic aneurysm or dissection. The heart size is normal. There is no pericardial effusion. Mediastinum/Nodes: There are no enlarged mediastinal, hilar or axillary lymph nodes. Small hiatal hernia. Lungs/Pleura: There is no pleural effusion. There are dependent confluent airspace opacities in both lower lobes with associated volume loss and air bronchograms. Appearance is suspicious for possible aspiration. There is no endobronchial lesion. Endotracheal tube tip is in the midtrachea. Musculoskeletal/Chest wall: There is no chest wall mass or suspicious osseous finding. Review of the MIP images confirms the above findings. CTA ABDOMEN AND PELVIS FINDINGS Vascular: Compared with the prior CT, there is mildly percent dilatation of the right common iliac artery which measures up to 1.9 cm on coronal image 67 of series 504. There is mild associated intimal irregularity. The abdominal aorta is normal in caliber. There is no evidence of aortic dissection or retroperitoneal hematoma. There is no evidence of branch vessel injury or large vessel occlusion. There is a small accessory renal artery on the right. Atherosclerosis of the aorta and its branch vessels is noted. Venous opacification is limited without apparent acute injury. Hepatobiliary: The liver is normal in  density without focal abnormality. No evidence of gallstones, gallbladder wall thickening or biliary dilatation. Pancreas: Unremarkable. No pancreatic ductal dilatation or surrounding inflammatory changes. Spleen: Normal in size without focal abnormality. Stable small splenule. Adrenals/Urinary Tract: Both adrenal glands appear normal. Both kidneys demonstrate mild cortical thinning and small low-density lesions, grossly stable and likely cysts. There are possible small renal caliceal calculi bilaterally. There is no evidence of hydronephrosis or ureteral calculus. However, there is possible wall thickening of the left renal pelvis with adjacent perinephric soft tissue stranding which may be mildly progressive compared with prior CTs. The left ureter appears normal in caliber. The urinary bladder is partially decompressed by a Foley catheter and is suboptimally evaluated. Stomach/Bowel: No evidence of bowel wall thickening, distention or surrounding inflammatory change. Mild sigmoid diverticulosis. The appendix appears normal. Vascular/Lymphatic: Vascular findings described above. There are no enlarged abdominal pelvic lymph nodes. Reproductive: The prostate gland appears unremarkable. Other: Stable prominent fat in both inguinal canals. Stable small umbilical hernia containing only fat. Musculoskeletal: No acute or significant osseous findings. There are postsurgical changes status post laminectomy and interbody fusion at L3-4 and L4-5. Surgical drain is in place. No unexpected fluid collections are identified. Review of the MIP images confirms the above findings. IMPRESSION: 1. No evidence of acute pulmonary embolism. 2. Dependent airspace opacities with air bronchograms in both lung bases suspicious for possible aspiration. 3. No evidence of retroperitoneal hematoma or acute vascular injury. 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 4. Expected postsurgical changes related to recent L3-4 and L4-5 posterior  decompression and interbody fusion. 5. Nonobstructing bilateral renal calculi. Possible progressive wall thickening of the left renal pelvis and adjacent perinephric soft tissue stranding. Given the patient's history of bladder cancer, urology follow-up recommended. Follow up CT to include pre contrast and delayed post-contrast imaging may be helpful to exclude a developing urothelial lesion. 6. The study was reviewed as it was being performed on 07/15/2016 at 1:50 pm with Dr. Kary Kos . Electronically Signed   By: Richardean Sale M.D.   On: 07/15/2016 14:35    Assessment/Plan  1 Postural Hypotension.  Patient continues to have postural Hypotension. His BP was 124/60 And it went down to 98/58 when he stood up. He also felt dizzy. Will hold his Diovan for now and follow his  BP closely.  2. Dehydration. Patient did not have any reason for being dehydrated except low oral intake. So will Encourage Oral fluid intake.  3. S/P Lumbar spine fusion  Will continue PT and Pain control.

## 2016-07-27 NOTE — Assessment & Plan Note (Signed)
Patient has appointment with Dr Saintclair Halsted tomorrow. Will continue Physical therapy.

## 2016-08-04 ENCOUNTER — Non-Acute Institutional Stay (SKILLED_NURSING_FACILITY): Payer: Medicare Other | Admitting: Internal Medicine

## 2016-08-04 ENCOUNTER — Encounter: Payer: Self-pay | Admitting: Internal Medicine

## 2016-08-04 DIAGNOSIS — F411 Generalized anxiety disorder: Secondary | ICD-10-CM

## 2016-08-04 DIAGNOSIS — I951 Orthostatic hypotension: Secondary | ICD-10-CM | POA: Diagnosis not present

## 2016-08-04 DIAGNOSIS — D649 Anemia, unspecified: Secondary | ICD-10-CM

## 2016-08-04 DIAGNOSIS — M48061 Spinal stenosis, lumbar region without neurogenic claudication: Secondary | ICD-10-CM

## 2016-08-04 DIAGNOSIS — M4806 Spinal stenosis, lumbar region: Secondary | ICD-10-CM

## 2016-08-04 NOTE — Progress Notes (Signed)
Location:   [facility] Room Number: 123/P Place of Service:  SNF [phone])  Provider: Granville Lewis  PCP: [name], MD Patient Care Team: Celene Squibb, MD as PCP - General (Internal Medicine)  Extended Emergency Contact Information Primary Emergency Contact: Viscomi,Patricia H Address: [address]          [place], [place] [address] Johnnette Litter of [place] Phone: [phone] Mobile Phone: [phone] Relation: Spouse Secondary Emergency Contact: Loreli Dollar [place] of [place] Phone: [phone] Relation: Sister  Code Status: DNR Goals of care:  Advanced Directive information Advanced Directives 07/27/2016  Does patient have an advance directive? Yes  Type of Paramedic of [place];Out of facility DNR (pink MOST or yellow form)  Does patient want to make changes to advanced directive? No - Patient declined  Copy of advanced directive(s) in chart? Yes  Would patient like information on creating an advanced directive? -  Pre-existing out of facility DNR order (yellow form or pink MOST form) -     Allergies  Allergen Reactions  . Sulfonamide Derivatives Itching, Rash and Other (See Comments)    whelps    Chief Complaint  Patient presents with  . Discharge Note    HPI:  70 y.o. male  seen today for discharge.   Patient was hospitalized from July 19 to July 20 03/29/2016 for surgical treatment of lumbar spinal stenosis due to degenerative disc disease and a herniated nucleus pulposus at L3-L4 and L4-L5.  Patient did undergo laminectomy-but 3 hours after anesthesia was started blood pressure did drop to the [id number] systolic range.  He received aggressive treatment including crystalloid and 500 mL of albumin as well as vasopressin and epinephrine Benadryl and hydrocortisone.  Surgery was aborted and he was transferred to intensive care. And patient was required because of respiratory failure-films revealed a left  lower lobe infiltrate and small left pleural effusion.  Neurosurgical procedure was completed on July 21 without complication.  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  He has been drinking fluids--and his Rapaflo as well as Diovan- hydrochlorothiazide have been discontinued-- blood pressure appears to be stable I got blood pressure 140/80 sitting and standing today-he says he is feeling stronger significantly better apparently has progressed well with therapy he will need PT and OT at home.  He is currently wearing a back brace.  He does not report any acute issues today             Past Medical History:  Diagnosis Date  . Anxiety    takes Valium as needed  . Arthritis   . Bladder cancer ([place])    takes Rapaflo daily  . Blood dyscrasia    07/08/16: pt told he was a "free bleeder" after bleeding a lot when derm cut off mole on forehead. No excessive bleeding with minor wounds at home, no bleeding problems perioperatively with prior surgeries.  . Chronic back pain    spondylolisthesis  . Cluster headaches   . Depression    takes Lexapro daily  . Essential hypertension, benign    takes Diovan-HCT daily  . Joint pain   . Obstructive sleep apnea on CPAP  uses CPAP @ night  . Pneumonia    "several times"--last time about 3-4 yrs ago  . PONV (postoperative nausea and vomiting)    Hx: of "sometimes"  . Prediabetes     Past Surgical History:  Procedure Laterality Date  . ANTERIOR CERVICAL DECOMP/DISCECTOMY FUSION N/A 06/05/2013   Procedure:  C3-4 Anterior Cervical Discectomy and Fusion, Allograft, Plate;  Surgeon: [name], MD;  Location: [facility];  Service: Orthopedics;  Laterality: N/A;  C3-4 Anterior Cervical Discectomy and Fusion, Allograft, Plate  . Arthroscopic knee surgery    . BACK SURGERY      x 2  . BLADDER SURGERY     x 5 to remove tumor  . CARPAL TUNNEL RELEASE    . CATARACT EXTRACTION W/PHACO  12/19/2012   Procedure: CATARACT EXTRACTION PHACO AND INTRAOCULAR LENS PLACEMENT (IOC);  Surgeon: [name], MD;  Location: [facility];  Service: Ophthalmology;  Laterality: Left;  CDE: 26.80  . CATARACT EXTRACTION W/PHACO  01/09/2013   Procedure: CATARACT EXTRACTION PHACO AND INTRAOCULAR LENS PLACEMENT (IOC);  Surgeon: [name], MD;  Location: [facility];  Service: Ophthalmology;  Laterality: Right;  CDE:22.83  . CERVICAL FUSION  06/05/2013   C 3  C4  . CYSTOSCOPY    . REFRACTIVE SURGERY     Hx: of  . RETINAL DETACHMENT SURGERY    . TRANSURETHRAL RESECTION OF BLADDER TUMOR Right 07/27/2015   Procedure: TRANSURETHRAL RESECTION OF BLADDER TUMOR (TURBT);  Surgeon: [name], MD;  Location: [facility];  Service: Urology;  Laterality: Right;      reports that he has quit smoking. He has never used smokeless tobacco. He reports that he does not drink alcohol or use drugs. Social History   Social History  . Marital status: Married    Spouse name: N/A  . Number of children: N/A  . Years of education: 12th    Occupational History  . Retired   .  Unemployed   Social History Main Topics  . Smoking status: Former Research scientist (life sciences)  . Smokeless tobacco: Never Used     Comment: 1985  . Alcohol use No  . Drug use: No  . Sexual activity: Yes   Other Topics Concern  . Not on file   Social History Narrative  . No narrative on file   Functional Status Survey:    Allergies  Allergen Reactions  . Sulfonamide Derivatives Itching, Rash and Other (See Comments)    whelps    Pertinent  Health Maintenance Due  Topic Date Due  . COLONOSCOPY  11/18/1996  . PNA vac Low Risk Adult (1 of 2 - PCV13) 11/19/2011    . INFLUENZA VACCINE  11/27/2016 (Originally 07/28/2016)    Medications: Current Outpatient Prescriptions on File Prior to Visit  Medication Sig Dispense Refill  . acetaminophen (TYLENOL) 500 MG tablet Take 500 mg by mouth every 6 (six) hours as needed for mild pain or headache. For pain     . Calcium Carbonate 500 MG CHEW Take 1 tablet by mouth once a day.    . Calcium Carbonate-Vitamin D (TGT CALCIUM DIETARY SUPPLEMENT PO) Take 1 tablet by mouth once a day  every other day    . cyclobenzaprine (FLEXERIL) 10 MG tablet Take 1 tablet (10 mg total) by mouth 3 (three) times daily as needed for muscle spasms. 90 tablet 2  . diazepam (VALIUM) 5 MG tablet Take 1 tablet (5 mg total) by mouth 2 (two) times daily as needed for anxiety. 60 tablet  0  . escitalopram (LEXAPRO) 10 MG tablet Take 10 mg by mouth daily. Take along with 20 mg to equal 30 mg    . escitalopram (LEXAPRO) 20 MG tablet Take 20 mg by mouth daily. Reported on 07/03/2016 take along with 10 mg to equal 30 mg  3  . fluticasone (FLONASE) 50 MCG/ACT nasal spray Place 1 spray into both nostrils daily.    . Magnesium Hydroxide (MILK OF MAGNESIA PO) Milk of magnesia 30 ml by mouth daily as needed for constipation may try fleets enema.    . Multiple Vitamin (MULTIVITAMIN WITH MINERALS) TABS tablet Take 1 tablet by mouth daily.    Marland Kitchen omeprazole (PRILOSEC) 20 MG capsule Take 1 capsule (20 mg total) by mouth daily. 30 capsule 3  . oxyCODONE (OXY IR/ROXICODONE) 5 MG immediate release tablet Take 1 tablet (5 mg total) by mouth every 6 (six) hours as needed for severe pain. 120 tablet 0  . sennosides-docusate sodium (SENOKOT-S) 8.6-50 MG tablet Take 1 tablet by mouth at bedtime.     No current facility-administered medications on file prior to visit.      Review of Systems   In general is not complaining of fever or chills says he feels significantly stronger  Skin does not complain of rashes or itching or diaphoresis  Head ears eyes nose mouth  and throat does not really complain of visual changes Or sore throat.  Respiratory is not complaining of shortness breath or cough.  Cardiac does not complain of chest pain.  GI is not complaining of nausea vomiting diarrhea-or constipation at this time.  GU is not complaining of dysuria.  Musculoskeletal at this point does not complain of pain.  Neurologic is not complaining of numbness does have some intermittent headaches which apparently are somewhat chronic.  No longer complains of dizziness while standing  Psych he does have a history of anxiety and depression is followed by Dr. Harrington Challenger psychiatrist. This appears significantly improved ever since the dizziness and orthostatic symptoms subsided  Vitals:   08/04/16 1326  BP: 116/76  Pulse: 86  Resp: 20  Temp: 98.4 F (36.9 C)  TempSrc: Oral  Weight: 217 lb 3.2 oz (98.5 kg)   Body mass index is 29.46 kg/m. Physical Exam  In general this is . a  well-nourished elderly male in no distress he appears stronger and feeling better than when I saw him previously   His skin is warm and dry  Eyes pupils appear reactive light visual acuity appears grossly intact.  Oropharynx is clear mucous membranes  moist.  Chest is clear to auscultation there is no labored breathing.  Heart is regular rate and rhythm ---he does not really have significant lower extremity edema.  Abdomen is soft nontender with positive bowel sounds.  Musculoskeletal does have back brace applied is able to move all extremities strength appears to be grossly intact. Is ambulating better though still somewhat weak Surgical site low back appears benign no sign of infection there is some pale postop erythema but this does not appear to be cellulitic there is well-healed crusting  Neurologic is grossly intact cranial nerves intact to speech is clear no lateralizing findings.  Psych he is alert and oriented considerably less anxious than when I  have seen him previously  Labs reviewed: Basic Metabolic Panel:  Recent Labs  07/15/16 1527 07/16/16 0400 07/17/16 0605 07/17/16 1802 07/22/16 0700 07/24/16 1611  NA 137 138 141 144 136 134*  K 3.1* 4.1 4.2 4.1 3.6  4.2  CL 105 106 107  --  103 102  CO2 27 26 28   --  28 25  GLUCOSE 177* 138* 150* 138* 90 88  BUN 9 8 15   --  28* 33*  CREATININE 1.15 1.03 1.08  --  0.89 1.15  CALCIUM 7.8* 8.1* 8.1*  --  7.8* 8.3*  MG 1.3* 1.4*  --   --   --   --   PHOS 3.1 2.6  --   --   --   --    Liver Function Tests:  Recent Labs  07/16/16 0400  ALBUMIN 3.0*   No results for input(s): LIPASE, AMYLASE in the last 8760 hours. No results for input(s): AMMONIA in the last 8760 hours. CBC:  Recent Labs  07/15/16 1527 07/16/16 0400 07/17/16 0605 07/17/16 1802 07/22/16 0700 07/24/16 1611  WBC 20.6* 17.2* 18.2*  --  14.2* 16.7*  NEUTROABS 19.5* 16.0*  --   --   --  13.2*  HGB 11.1* 10.8* 10.1* 9.2* 11.3* 11.4*  HCT 34.1* 32.7* 30.6* 27.0* 33.7* 34.0*  MCV 89.3 88.4 88.4  --  91.1 91.6  PLT 164 153 179  --  197 229   Cardiac Enzymes:  Recent Labs  07/15/16 1527 07/15/16 2155 07/16/16 0400  TROPONINI <0.03 <0.03 <0.03   BNP: Invalid input(s): POCBNP CBG: No results for input(s): GLUCAP in the last 8760 hours.  Procedures and Imaging Studies During Stay: Dg Lumbar Spine 2-3 Views  Result Date: 07/17/2016 CLINICAL DATA:  Patient status post cage and pedicle screw implantation. EXAM: DG C-ARM 1-60 MIN-NO REPORT; LUMBAR SPINE - 2-3 VIEW; DG C-ARM 61-120 MIN COMPARISON:  Lumbar spine MRI 06/12/2016. FINDINGS: 2 intraoperative fluoroscopic images were submitted for interpretation. Intervertebral body spacers are demonstrated at the L3-4 and L4-5 level. Transpedicular screws are present at the L3, L4 and L5 level. IMPRESSION: Two intraoperative fluoroscopic images as above demonstrating L3-L5 intervertebral body spacer and transpedicular screw placement. Electronically Signed   By:  Lovey Newcomer M.D.   On: 07/17/2016 18:53   Dg Chest Port 1 View  Result Date: 07/16/2016 CLINICAL DATA:  Respiratory failure. EXAM: PORTABLE CHEST 1 VIEW COMPARISON:  07/15/2016.  05/17/2016. FINDINGS: Endotracheal tube tip 4 cm above the carina. Mediastinum and hilar structures normal. Heart size stable. Low lung volumes with bibasilar atelectasis. Unchanged persist left lower lobe infiltrate. Small left pleural effusion cannot be excluded. No pneumothorax. IMPRESSION: 1.  Endotracheal tube tip 4 cm above the carina. 2. Low lung volumes with persistent bibasilar atelectasis. Persistent unchanged left lower lobe infiltrate. Small left pleural effusion. Electronically Signed   By: Marcello Moores  Register   On: 07/16/2016 07:21   Dg Chest Port 1 View  Result Date: 07/15/2016 CLINICAL DATA:  Hypotensive event during surgery; hypoxia EXAM: PORTABLE CHEST 1 VIEW COMPARISON:  May 17, 2016 FINDINGS: Endotracheal tube tip is 5.2 cm above the carina. No pneumothorax. There is a focal area of airspace consolidation in the left base. Lungs elsewhere clear. Heart is upper normal in size with pulmonary vascularity within normal limits. No adenopathy. No bone lesions. IMPRESSION: Endotracheal tube as described without pneumothorax. Focal airspace consolidation left base. Question pneumonia versus aspiration as most likely etiologies for this opacity. Lungs elsewhere clear. Stable cardiac silhouette. Electronically Signed   By: Lowella Grip III M.D.   On: 07/15/2016 15:29   Dg C-arm 1-60 Min  Result Date: 07/17/2016 CLINICAL DATA:  Patient status post cage and pedicle screw implantation. EXAM: DG C-ARM 1-60 MIN-NO  REPORT; LUMBAR SPINE - 2-3 VIEW; DG C-ARM 61-120 MIN COMPARISON:  Lumbar spine MRI 06/12/2016. FINDINGS: 2 intraoperative fluoroscopic images were submitted for interpretation. Intervertebral body spacers are demonstrated at the L3-4 and L4-5 level. Transpedicular screws are present at the L3, L4 and L5 level.  IMPRESSION: Two intraoperative fluoroscopic images as above demonstrating L3-L5 intervertebral body spacer and transpedicular screw placement. Electronically Signed   By: Lovey Newcomer M.D.   On: 07/17/2016 18:53   Dg C-arm 1-60 Min-no Report  Result Date: 07/17/2016 CLINICAL DATA:  Patient status post cage and pedicle screw implantation. EXAM: DG C-ARM 1-60 MIN-NO REPORT; LUMBAR SPINE - 2-3 VIEW; DG C-ARM 61-120 MIN COMPARISON:  Lumbar spine MRI 06/12/2016. FINDINGS: 2 intraoperative fluoroscopic images were submitted for interpretation. Intervertebral body spacers are demonstrated at the L3-4 and L4-5 level. Transpedicular screws are present at the L3, L4 and L5 level. IMPRESSION: Two intraoperative fluoroscopic images as above demonstrating L3-L5 intervertebral body spacer and transpedicular screw placement. Electronically Signed   By: Lovey Newcomer M.D.   On: 07/17/2016 18:53  Ct Angio Chest/abd/pel For Dissection W And/or W/wo  Result Date: 07/15/2016 CLINICAL DATA:  Acute onset of hypotension and tachycardia during lumbar spine fusion. Evaluate for pulmonary embolism, vascular injury and retroperitoneal hematoma. EXAM: CT ANGIOGRAPHY CHEST, ABDOMEN AND PELVIS TECHNIQUE: Multidetector CT imaging through the chest, abdomen and pelvis was performed using the standard protocol during bolus administration of intravenous contrast. Multiplanar reconstructed images and MIPs were obtained and reviewed to evaluate the vascular anatomy. CONTRAST:  100 ml Isovue 370. COMPARISON:  Abdominal pelvic CT 12/26/2015 and 07/31/2015. FINDINGS: CTA CHEST FINDINGS Cardiovascular: Pre contrast images demonstrate no displaced intimal calcifications within the thoracic aorta. There is atherosclerosis of the aorta and coronary arteries. Post-contrast, there is adequate opacification of the pulmonary arteries and systemic arteries. No evidence of acute pulmonary embolism. No evidence of aortic aneurysm or dissection. The heart size  is normal. There is no pericardial effusion. Mediastinum/Nodes: There are no enlarged mediastinal, hilar or axillary lymph nodes. Small hiatal hernia. Lungs/Pleura: There is no pleural effusion. There are dependent confluent airspace opacities in both lower lobes with associated volume loss and air bronchograms. Appearance is suspicious for possible aspiration. There is no endobronchial lesion. Endotracheal tube tip is in the midtrachea. Musculoskeletal/Chest wall: There is no chest wall mass or suspicious osseous finding. Review of the MIP images confirms the above findings. CTA ABDOMEN AND PELVIS FINDINGS Vascular: Compared with the prior CT, there is mildly percent dilatation of the right common iliac artery which measures up to 1.9 cm on coronal image 67 of series 504. There is mild associated intimal irregularity. The abdominal aorta is normal in caliber. There is no evidence of aortic dissection or retroperitoneal hematoma. There is no evidence of branch vessel injury or large vessel occlusion. There is a small accessory renal artery on the right. Atherosclerosis of the aorta and its branch vessels is noted. Venous opacification is limited without apparent acute injury. Hepatobiliary: The liver is normal in density without focal abnormality. No evidence of gallstones, gallbladder wall thickening or biliary dilatation. Pancreas: Unremarkable. No pancreatic ductal dilatation or surrounding inflammatory changes. Spleen: Normal in size without focal abnormality. Stable small splenule. Adrenals/Urinary Tract: Both adrenal glands appear normal. Both kidneys demonstrate mild cortical thinning and small low-density lesions, grossly stable and likely cysts. There are possible small renal caliceal calculi bilaterally. There is no evidence of hydronephrosis or ureteral calculus. However, there is possible wall thickening of the left renal pelvis  with adjacent perinephric soft tissue stranding which may be mildly  progressive compared with prior CTs. The left ureter appears normal in caliber. The urinary bladder is partially decompressed by a Foley catheter and is suboptimally evaluated. Stomach/Bowel: No evidence of bowel wall thickening, distention or surrounding inflammatory change. Mild sigmoid diverticulosis. The appendix appears normal. Vascular/Lymphatic: Vascular findings described above. There are no enlarged abdominal pelvic lymph nodes. Reproductive: The prostate gland appears unremarkable. Other: Stable prominent fat in both inguinal canals. Stable small umbilical hernia containing only fat. Musculoskeletal: No acute or significant osseous findings. There are postsurgical changes status post laminectomy and interbody fusion at L3-4 and L4-5. Surgical drain is in place. No unexpected fluid collections are identified. Review of the MIP images confirms the above findings. IMPRESSION: 1. No evidence of acute pulmonary embolism. 2. Dependent airspace opacities with air bronchograms in both lung bases suspicious for possible aspiration. 3. No evidence of retroperitoneal hematoma or acute vascular injury. 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 4. Expected postsurgical changes related to recent L3-4 and L4-5 posterior decompression and interbody fusion. 5. Nonobstructing bilateral renal calculi. Possible progressive wall thickening of the left renal pelvis and adjacent perinephric soft tissue stranding. Given the patient's history of bladder cancer, urology follow-up recommended. Follow up CT to include pre contrast and delayed post-contrast imaging may be helpful to exclude a developing urothelial lesion. 6. The study was reviewed as it was being performed on 07/15/2016 at 1:50 pm with Dr. Kary Kos . Electronically Signed   By: Richardean Sale M.D.   On: 07/15/2016 14:35    Assessment/Plan:    1 history of spinal stenosis status post laminectomy-it appears now to be recovering well had some postop  hypotension in respiratory issues as noted above but this appears to have stabilized significantly.  He will need continued PT and OT at home he is receiving OxyIR as needed for pain as well as a when necessary muscle relaxer.  #2 orthostatic hypertension this appears to be stabilized Rapaflo as well as blood pressure medicine Diovan-chlorothiazide has been discontinued-he does have a history of BPH this will need follow-up by primary care provider and I suspect urology but he does not complain of dysuria or urinary retention currently.  He is no longer symptomatic and both of hypotension which has certainly aided his rehabilitation course.  #3 anemia suspect there is a postop element to this this appears to have stabilized with hemoglobins around 11 recently-I do note he does have leukocytosis which appears to be fairly chronic during and after his hospitalization will recheck a CBC before discharge he does not show signs of any fever or chills increased chest congestion or dysuria.  #4-history of anxiety this has been significant the past has been followed by psychiatrist this appears stable at this point he is on Valium-continues as well on Lexapro for coexistent depression.  #5 hypertension again he is off his blood pressure medication in the last blood pressure appears to be stable I got 140/80 sitting and standing today--I see previous list of blood pressures 111/71-116/76-119/71 this appears to be stable.  #6 history of obstructive sleep apnea at this appendix stable during his stay here apparently is compliant with his CPAP.  Again patient will be going home he will need PT and OT for further strengthening but his postop course recently has been uneventful-.  [id number] note greater than 30 minutes spent assessing patient discussing his status with nursing staff reviewing his chart-his labs-and coordinating and formulating  plan of care for numerous diagnoses   :

## 2016-08-05 ENCOUNTER — Encounter (HOSPITAL_COMMUNITY)
Admission: RE | Admit: 2016-08-05 | Discharge: 2016-08-05 | Disposition: A | Discharge status: Home / Self Care | Payer: Medicare Other | Source: Skilled Nursing Facility | Attending: Internal Medicine | Admitting: Internal Medicine

## 2016-08-05 DIAGNOSIS — F329 Major depressive disorder, single episode, unspecified: Secondary | ICD-10-CM | POA: Insufficient documentation

## 2016-08-05 DIAGNOSIS — Z48811 Encounter for surgical aftercare following surgery on the nervous system: Secondary | ICD-10-CM | POA: Insufficient documentation

## 2016-08-05 DIAGNOSIS — F411 Generalized anxiety disorder: Secondary | ICD-10-CM | POA: Insufficient documentation

## 2016-08-05 LAB — BASIC METABOLIC PANEL
Anion gap: 9 (ref 5–15)
BUN: 14 mg/dL (ref 6–20)
CHLORIDE: 103 mmol/L (ref 101–111)
CO2: 27 mmol/L (ref 22–32)
CREATININE: 0.98 mg/dL (ref 0.61–1.24)
Calcium: 8.5 mg/dL — ABNORMAL LOW (ref 8.9–10.3)
GFR calc Af Amer: >60 mL/min (ref >60)
GFR calc non Af Amer: >60 mL/min (ref >60)
GLUCOSE: 96 mg/dL (ref 65–99)
POTASSIUM: 4.1 mmol/L (ref 3.5–5.1)
SODIUM: 139 mmol/L (ref 135–145)

## 2016-08-05 LAB — CBC
HEMATOCRIT: 32.1 % — AB (ref 39.0–52.0)
HEMOGLOBIN: 10.6 g/dL — AB (ref 13.0–17.0)
MCH: 30.5 pg (ref 26.0–34.0)
MCHC: 33 g/dL (ref 30.0–36.0)
MCV: 92.5 fL (ref 78.0–100.0)
Platelets: 228 10*3/uL (ref 150–400)
RBC: 3.47 MIL/uL — AB (ref 4.22–5.81)
RDW: 14.2 % (ref 11.5–15.5)
WBC: 8.1 10*3/uL (ref 4.0–10.5)

## 2016-08-07 ENCOUNTER — Encounter (HOSPITAL_COMMUNITY): Payer: Self-pay

## 2016-08-07 ENCOUNTER — Other Ambulatory Visit: Payer: Self-pay | Admitting: Internal Medicine

## 2016-08-12 ENCOUNTER — Ambulatory Visit (HOSPITAL_COMMUNITY): Payer: Medicare Other | Attending: Internal Medicine | Admitting: Physical Therapy

## 2016-08-12 DIAGNOSIS — M545 Low back pain: Secondary | ICD-10-CM | POA: Insufficient documentation

## 2016-08-12 DIAGNOSIS — R2681 Unsteadiness on feet: Secondary | ICD-10-CM

## 2016-08-12 DIAGNOSIS — R262 Difficulty in walking, not elsewhere classified: Secondary | ICD-10-CM | POA: Diagnosis not present

## 2016-08-12 DIAGNOSIS — M6281 Muscle weakness (generalized): Secondary | ICD-10-CM | POA: Diagnosis not present

## 2016-08-12 DIAGNOSIS — R29898 Other symptoms and signs involving the musculoskeletal system: Secondary | ICD-10-CM | POA: Insufficient documentation

## 2016-08-12 NOTE — Patient Instructions (Signed)
Knee Extension: Sit to Stand (Eccentric)    Stand close to chair. Slowly lower self to seated position. __5-10_ reps per set, _2_ sets per day, _5-7__ days per week. Progress to stopping midway before lowering to chair. Progress to barely touching chair.  Copyright  VHI. All rights reserved.      BRIDGING  While lying on your back, tighten your lower abdominals, squeeze your buttocks and then raise your buttocks off the floor/bed as creating a "Bridge" with your body. Hold and then lower yourself and repeat.  Repeat 5-10  times, twice a day.     TANDEM STANCE WITH SUPPORT  Stand in front of a chair, table or counter top for support. Then place the heel of one foot so that it is touching the toes of the other foot. Maintain your balance in this position.   You may use your fingertips on the counter for now; if this begins to feel too easy you may take one hand, or both hands, off of the counter. Just be safe about it.  Hold for 10 seconds, then switch stance. Repeat twice each side, twice a day.

## 2016-08-12 NOTE — Therapy (Signed)
[place] [address] [place], [place], [address] Phone: [phone]   Fax:  [phone]  Physical Therapy Evaluation  Patient Details  Name: Zachary [name] MRN: [id number] Date of Birth: 08/04/1946 Referring Provider: Veleta Miners   Encounter Date: 08/12/2016      PT End of Session - 08/12/16 1231    Visit Number 1   Number of Visits 16   Date for PT Re-Evaluation 09/09/16   Authorization Type Medicare/Aetna Senior Supplement    Authorization Time Period 08/12/16 to 10/12/16   Authorization - Visit Number 1   Authorization - Number of Visits 10   PT Start Time 0948   PT Stop Time 1032   PT Time Calculation (min) 44 min   Equipment Utilized During Treatment Back brace   Activity Tolerance Patient tolerated treatment well   Behavior During Therapy [facility] for tasks assessed/performed      Past Medical History:  Diagnosis Date  . Anxiety    takes Valium as needed  . Arthritis   . Bladder cancer ([place])    takes Rapaflo daily  . Blood dyscrasia    07/08/16: pt told he was a "free bleeder" after bleeding a lot when derm cut off mole on forehead. No excessive bleeding with minor wounds at home, no bleeding problems perioperatively with prior surgeries.  . Chronic back pain    spondylolisthesis  . Cluster headaches   . Depression    takes Lexapro daily  . Essential hypertension, benign    takes Diovan-HCT daily  . Joint pain   . Obstructive sleep apnea on CPAP    uses CPAP @ night  . Pneumonia    "several times"--last time about 3-4 yrs ago  . PONV (postoperative nausea and vomiting)    Hx: of "sometimes"  . Prediabetes     Past Surgical History:  Procedure Laterality Date  . ANTERIOR CERVICAL DECOMP/DISCECTOMY FUSION N/A 06/05/2013   Procedure:  C3-4 Anterior Cervical Discectomy and Fusion, Allograft, Plate;  Surgeon: [name], MD;  Location: [facility];  Service: Orthopedics;  Laterality: N/A;  C3-4 Anterior Cervical  Discectomy and Fusion, Allograft, Plate  . Arthroscopic knee surgery    . BACK SURGERY      x 2  . BLADDER SURGERY     x 5 to remove tumor  . CARPAL TUNNEL RELEASE    . CATARACT EXTRACTION W/PHACO  12/19/2012   Procedure: CATARACT EXTRACTION PHACO AND INTRAOCULAR LENS PLACEMENT (IOC);  Surgeon: [name], MD;  [facility];  Service: Ophthalmology;  Laterality: Left;  CDE: 26.80  . CATARACT EXTRACTION W/PHACO  01/09/2013   Procedure: CATARACT EXTRACTION PHACO AND INTRAOCULAR LENS PLACEMENT (IOC);  Surgeon: [name], MD;  [facility]: [facility];  Service: Ophthalmology;  Laterality: Right;  CDE:22.83  . CERVICAL FUSION  06/05/2013   C 3  C4  . CYSTOSCOPY    . REFRACTIVE SURGERY     Hx: of  . RETINAL DETACHMENT SURGERY    . TRANSURETHRAL RESECTION OF BLADDER TUMOR Right 07/27/2015   Procedure: TRANSURETHRAL RESECTION OF BLADDER TUMOR (TURBT);  Surgeon: [name], MD;  [facility];  Service: Urology;  Laterality: Right;    There were no vitals filed for this visit.       Subjective Assessment - 08/12/16 0954    Subjective Patient had extensive back surgery done on 07/14/16 due to severe pain in both sides of his gluts and all the way down his thighs; per surgical  notes, lumbar fusion with decompression was performed. Patient botttomed out due to low BP and ended up on ventilator for 24 hours due to this. Patient went to the [facility] after being discharged from the hospital. It is still hard for him to bend over and pick up things but he is still on BLT precautions. He reports his balance is doing well, no falls or close calls recently.    Pertinent History HTN, OSA, hypotension, history of cervical HNP, anxiety, pre-diabetic, history of cervical and lumbar surgeries    How long can you sit comfortably? depends on the day    How long can you stand comfortably? depends on the day    How long can you walk comfortably? depends on the day    Patient Stated Goals get back to PLOF     Currently in Pain? No/denies  but in standing, pain 3/10 R low back/hip             [facility] PT Assessment - 08/12/16 0001      Assessment   Medical Diagnosis spinal stenosis    Referring Provider Veleta Miners    Onset Date/Surgical Date 07/14/16   Next MD Visit Dr. [name], PCP, next week      Precautions   Precaution Comments NO BENDING, LIFTING, TWISTING     Balance Screen   Has the patient fallen in the past 6 months No   Has the patient had a decrease in activity level because of a fear of falling?  No   Is the patient reluctant to leave their home because of a fear of falling?  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      Strength   Right Hip Flexion 4+/5   Right Hip ABduction 4-/5  hip flexor compensation noted    Left Hip Flexion 4+/5   Left Hip ABduction 4-/5  hip flexor compensation   Right Knee Flexion 4+/5   Right Knee Extension 4+/5   Left Knee Flexion 4/5   Left Knee Extension 4+/5   Right Ankle Dorsiflexion 5/5   Left Ankle Dorsiflexion 5/5     Transfers   Five time sit to stand comments  13.02 seconds, no UEs      Ambulation/Gait   Gait Comments supination noted B, rigidity of hips, flexed at hips, reduced heel-toe pattern     6 minute walk test results    Aerobic Endurance Distance Walked 452   Endurance additional comments 3MWT, SPC      High Level Balance   High Level Balance Comments TUG 14.38 with SPC; SLS unable L, 5 second best R                            PT Education - 08/12/16 1231    Education provided Yes   Education Details changed focus of HEP from [facility] from endurance to strength by adding red TB; updated HEP; prognosis, POC    Person(s) Educated Patient   Methods Explanation;Demonstration;Handout   Comprehension Verbalized understanding;Need  further instruction;Returned demonstration          PT Short Term Goals - 08/12/16 1236      PT SHORT TERM GOAL #1   Title Patient will be able to ambulate at least 666ft with SPC during 3MWT in order to demonstrate  improved mobility and safety with community ambulation    Time 4   Period Weeks   Status New     PT SHORT TERM GOAL #2   Title Patient to be able to perform TUG test in 10 seconds or less with SPC to show improved mobility and reduced fall risk    Time 4   Period Weeks   Status New     PT SHORT TERM GOAL #3   Title Patient to be participatory in regular walking program, at least 5 days per week and at least 20 minutes in duration in order to build functional activity tolerance and improve mobility    Time 4   Period Weeks   Status New     PT SHORT TERM GOAL #4   Title Patient to be independent in correctly and consistently performing appropriate HEP, to be updated PRN    Time 4   Period Weeks   Status New           PT Long Term Goals - 08/12/16 1237      PT LONG TERM GOAL #1   Title Patient to demonstrate strength 5/5 in all tested muscle groups in order to facilitate improved mobility within community    Time 8   Period Weeks   Status New     PT LONG TERM GOAL #2   Title Patient to be able to maintain SLS for 30 seconds each LE in order to show improved balance and reduced fall risk    Time 8   Period Weeks   Status New     PT LONG TERM GOAL #3   Title Patient to be able to ambulate unlimited distances with no assistive device and minimal fatigue or unsteadiness in order to faciltiate safe return to community based activities    Time 8   Period Weeks   Status New     PT LONG TERM GOAL #4   Title Patient to be participatory in regular aerobic exercise program, at least 3 days per week, at least 20-30 minutes in duration, in order to maintain functional gains and to improve overall health status    Time 8   Period Weeks   Status New                Plan - 08/12/16 1233    Clinical Impression Statement Patient arrives status-post lumbar fusion/decompression surgery that was performed on 07/14/16; he was in the [facility] for rehabilitation and was just discharged this past Saturday, states he is doing well overall but would like to get back to PLOF including not needing his cane. Upon examination, patient reveals postural and gait deficits, functional muscle weakness, reduced functional activity tolerance, unsteadiness, and reduced functional task performance skills at this time. He remains on [place] spine brace and also standard BLT precautions for his back. At this time patient will benefit from skilled PT services to address functional limitations and assist in reaching optimal level of function.    Rehab Potential Good   PT Frequency 2x / week   PT Duration 8 weeks   PT Treatment/Interventions ADLs/Self Care Home Management;Cryotherapy;Moist Heat;DME Instruction;Gait training;Stair training;Functional mobility training;Therapeutic activities;Therapeutic exercise;Balance training;Neuromuscular re-education;Patient/family education;Manual techniques;Passive range of motion;Energy conservation;Taping   PT Next Visit Plan review HEP (do not update yet) and discuss goals; focus on balance, functional strength, functional activity tolerance. NO BENDING, LIFTING, TWISTING (BLT PRECAUTIONS).    PT Home Exercise Plan updated HEP from [facility] via addition  of red TB; added eccentric STS, bridges, tandem stance with UE support    Consulted and Agree with Plan of Care Patient      Patient will benefit from skilled therapeutic intervention in order to improve the following deficits and impairments:  Abnormal gait, Improper body mechanics, Pain, Decreased coordination, Decreased mobility, Postural dysfunction, Decreased activity tolerance, Decreased strength, Decreased balance, Difficulty walking, Impaired flexibility  Visit  Diagnosis: Midline low back pain, with sciatica presence unspecified - Plan: PT plan of care cert/re-cert  Muscle weakness (generalized) - Plan: PT plan of care cert/re-cert  Unsteadiness on feet - Plan: PT plan of care cert/re-cert  Difficulty in walking, not elsewhere classified - Plan: PT plan of care cert/re-cert  Other symptoms and signs involving the musculoskeletal system - Plan: PT plan of care cert/re-cert      G-Codes - [id number]    Functional Assessment Tool Used Based on skilled clinical assessment of gait, strength, balance, functional activity tolerance    Functional Limitation Mobility: Walking and moving around   Mobility: Walking and Moving Around Current Status [id number]) At least 40 percent but less than 60 percent impaired, limited or restricted   Mobility: Walking and Moving Around Goal Status [id number]) At least 20 percent but less than 40 percent impaired, limited or restricted       Problem List Patient Active Problem List   Diagnosis Date Noted  . Postural hypotension 07/23/2016  . Dysphagia 07/23/2016  . Obstructive sleep apnea 07/21/2016  . Hyperglycemia 07/21/2016  . Anemia, unspecified 07/21/2016  . Spinal stenosis of lumbar region 07/15/2016  . Bladder tumor 07/27/2015  . HNP (herniated nucleus pulposus), cervical 06/05/2013    Class: Diagnosis of  . 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  . Muscle spasms of neck 04/04/2013  . 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 OA (osteoarthritis) of knee 02/22/2013  . Iliotibial band syndrome of left side 11/22/2012  . Localized, primary osteoarthritis of the ankle and foot 10/11/2012  . Difficulty in walking(719.7) 08/17/2012  . Peroneal tendinitis 08/16/2012  . Precordial pain 02/09/2012  . Essential hypertension, benign 02/09/2012  . Mixed hyperlipidemia 02/09/2012  . Dysthymia 02/04/2012  . Abnormality of gait 12/23/2011  . Lateral epicondylitis/tennis elbow 05/06/2011  . TENOSYNOVITIS OF FOOT  AND ANKLE 10/22/2010    Deniece Ree PT, DPT [place] [address] [place], [place], [address] Phone: [phone]   Fax:  [phone]  Name: Zachary [name] MRN: [id number] Date of Birth: 1946-07-10

## 2016-08-14 ENCOUNTER — Ambulatory Visit (HOSPITAL_COMMUNITY): Payer: Medicare Other

## 2016-08-14 DIAGNOSIS — R29898 Other symptoms and signs involving the musculoskeletal system: Secondary | ICD-10-CM

## 2016-08-14 DIAGNOSIS — M545 Low back pain: Secondary | ICD-10-CM | POA: Diagnosis not present

## 2016-08-14 DIAGNOSIS — R2681 Unsteadiness on feet: Secondary | ICD-10-CM

## 2016-08-14 DIAGNOSIS — R262 Difficulty in walking, not elsewhere classified: Secondary | ICD-10-CM | POA: Diagnosis not present

## 2016-08-14 DIAGNOSIS — M6281 Muscle weakness (generalized): Secondary | ICD-10-CM | POA: Diagnosis not present

## 2016-08-14 NOTE — Therapy (Signed)
[place], [place], [address] Phone: [phone]   Fax:  [phone]  Physical Therapy Treatment  Patient Details  Name: Zachary [name] MRN: [id number] Date of Birth: [birth year] Referring Provider: Veleta Miners  Encounter Date: 08/14/2016      PT End of Session - 08/14/16 1039    Visit Number 2   Number of Visits 16   Date for PT Re-Evaluation 09/09/16   Authorization Type Medicare/Aetna Senior Supplement    Authorization - Visit Number 2   Authorization - Number of Visits 10   PT Start Time [id number]   PT Stop Time 1118   PT Time Calculation (min) 43 min   Equipment Utilized During Treatment Back brace;Gait belt   Activity Tolerance Patient tolerated treatment well   Behavior During Therapy [facility] for tasks assessed/performed      Past Medical History:  Diagnosis Date  . Anxiety    takes Valium as needed  . Arthritis   . Bladder cancer ([place])    takes Rapaflo daily  . Blood dyscrasia    07/08/16: pt told he was a "free bleeder" after bleeding a lot when derm cut off mole on forehead. No excessive bleeding with minor wounds at home, no bleeding problems perioperatively with prior surgeries.  . Chronic back pain    spondylolisthesis  . Cluster headaches   . Depression    takes Lexapro daily  . Essential hypertension, benign    takes Diovan-HCT daily  . Joint pain   . Obstructive sleep apnea on CPAP    uses CPAP @ night  . Pneumonia    "several times"--last time about 3-4 yrs ago  . PONV (postoperative nausea and vomiting)    Hx: of "sometimes"  . Prediabetes     Past Surgical History:  Procedure Laterality Date  . ANTERIOR CERVICAL DECOMP/DISCECTOMY FUSION N/A 06/05/2013   Procedure:  C3-4 Anterior Cervical Discectomy and Fusion, Allograft, Plate;  Surgeon: [name], MD;  Location: [facility];  Service: Orthopedics;  Laterality: N/A;  C3-4 Anterior Cervical Discectomy and Fusion, Allograft, Plate  .  Arthroscopic knee surgery    . BACK SURGERY      x 2  . BLADDER SURGERY     x 5 to remove tumor  . CARPAL TUNNEL RELEASE    . CATARACT EXTRACTION W/PHACO  12/19/2012   Procedure: CATARACT EXTRACTION PHACO AND INTRAOCULAR LENS PLACEMENT (IOC);  Surgeon: [name], MD;  [facility];  Service: Ophthalmology;  Laterality: Left;  CDE: 26.80  . CATARACT EXTRACTION W/PHACO  01/09/2013   Procedure: CATARACT EXTRACTION PHACO AND INTRAOCULAR LENS PLACEMENT (IOC);  Surgeon: [name], MD;  [facility]: [facility];  Service: Ophthalmology;  Laterality: Right;  CDE:22.83  . CERVICAL FUSION  06/05/2013   C 3  C4  . CYSTOSCOPY    . REFRACTIVE SURGERY     Hx: of  . RETINAL DETACHMENT SURGERY    . TRANSURETHRAL RESECTION OF BLADDER TUMOR Right 07/27/2015   Procedure: TRANSURETHRAL RESECTION OF BLADDER TUMOR (TURBT);  Surgeon: [name], MD;  [facility];  Service: Urology;  Laterality: Right;    There were no vitals filed for this visit.      Subjective Assessment - 08/14/16 1037    Subjective Pt stated he is feeling good today, no reports of pain today.  Pt stated his personal goals are to get rid of the [facility] and to improve balance.   Reports compliance with HEP.  Pertinent History HTN, OSA, hypotension, history of cervical HNP, anxiety, pre-diabetic, history of cervical and lumbar surgeries    Patient Stated Goals get back to PLOF, improve balance and walking without SPC   Currently in Pain? No/denies            [facility] PT Assessment - 08/14/16 0001      Assessment   Medical Diagnosis spinal stenosis    Referring Provider Veleta Miners   Onset Date/Surgical Date 07/14/16   Next MD Visit Dr. [name], PCP, next week      Precautions   Precaution Comments NO BENDING, Louellen Molder                     [facility] Adult PT Treatment/Exercise - 08/14/16 0001      Exercises   Exercises Lumbar     Lumbar Exercises: Stretches   Active Hamstring Stretch 3 reps;30 seconds    Active Hamstring Stretch Limitations supine with rope     Lumbar Exercises: Standing   Heel Raises 10 reps   Heel Raises Limitations Toe raises 10x   Functional Squats 10 reps   Functional Squats Limitations cueing for form   Forward Lunge 10 reps   Forward Lunge Limitations 6in step   Other Standing Lumbar Exercises posterior shoulder rolls with scapula retraction   Other Standing Lumbar Exercises tandem stance 3x 30"     Lumbar Exercises: Seated   Sit to Stand 10 reps   Sit to Stand Limitations eccentric control     Lumbar Exercises: Supine   Bridge Limitations reviewed form and compliance with HEP                  PT Short Term Goals - 08/12/16 1236      PT SHORT TERM GOAL #1   Title Patient will be able to ambulate at least 613ft with SPC during 3MWT in order to demonstrate improved mobility and safety with community ambulation    Time 4   Period Weeks   Status New     PT SHORT TERM GOAL #2   Title Patient to be able to perform TUG test in 10 seconds or less with SPC to show improved mobility and reduced fall risk    Time 4   Period Weeks   Status New     PT SHORT TERM GOAL #3   Title Patient to be participatory in regular walking program, at least 5 days per week and at least 20 minutes in duration in order to build functional activity tolerance and improve mobility    Time 4   Period Weeks   Status New     PT SHORT TERM GOAL #4   Title Patient to be independent in correctly and consistently performing appropriate HEP, to be updated PRN    Time 4   Period Weeks   Status New           PT Long Term Goals - 08/12/16 1237      PT LONG TERM GOAL #1   Title Patient to demonstrate strength 5/5 in all tested muscle groups in order to facilitate improved mobility within community    Time 8   Period Weeks   Status New     PT LONG TERM GOAL #2   Title Patient to be able to maintain SLS for 30 seconds each LE in order to show improved balance and  reduced fall risk    Time 8   Period Weeks  Status New     PT LONG TERM GOAL #3   Title Patient to be able to ambulate unlimited distances with no assistive device and minimal fatigue or unsteadiness in order to faciltiate safe return to community based activities    Time 8   Period Weeks   Status New     PT LONG TERM GOAL #4   Title Patient to be participatory in regular aerobic exercise program, at least 3 days per week, at least 20-30 minutes in duration, in order to maintain functional gains and to improve overall health status    Time 8   Period Weeks   Status New               Plan - 08/14/16 1049    Clinical Impression Statement Reviewed goals, complaince with HEP and copy of eval given to pt.  Reviewed BLT precautions and proper bed mobility, pt able to verbalize and demonstrate appropriate form.  Session focus on functional strengthening and balance training with therapist facilitation for proper form and technique and CGA for safety, cueing for posture with majority of therex.  No reports of pain through session.     Rehab Potential Good   PT Frequency 2x / week   PT Duration 8 weeks   PT Treatment/Interventions ADLs/Self Care Home Management;Cryotherapy;Moist Heat;DME Instruction;Gait training;Stair training;Functional mobility training;Therapeutic activities;Therapeutic exercise;Balance training;Neuromuscular re-education;Patient/family education;Manual techniques;Passive range of motion;Energy conservation;Taping   PT Next Visit Plan  focus on balance, functional strength, functional activity tolerance. NO BENDING, LIFTING, TWISTING (BLT PRECAUTIONS).    PT Home Exercise Plan updated HEP from [facility] via addition of red TB; added eccentric STS, bridges, tandem stance with UE support   No additional exercises given this session.      Patient will benefit from skilled therapeutic intervention in order to improve the following deficits and impairments:  Abnormal  gait, Improper body mechanics, Pain, Decreased coordination, Decreased mobility, Postural dysfunction, Decreased activity tolerance, Decreased strength, Decreased balance, Difficulty walking, Impaired flexibility  Visit Diagnosis: Midline low back pain, with sciatica presence unspecified  Muscle weakness (generalized)  Unsteadiness on feet  Difficulty in walking, not elsewhere classified  Other symptoms and signs involving the musculoskeletal system     Problem List Patient Active Problem List   Diagnosis Date Noted  . Postural hypotension 07/23/2016  . Dysphagia 07/23/2016  . Obstructive sleep apnea 07/21/2016  . Hyperglycemia 07/21/2016  . Anemia, unspecified 07/21/2016  . Spinal stenosis of lumbar region 07/15/2016  . Bladder tumor 07/27/2015  . HNP (herniated nucleus pulposus), cervical 06/05/2013    Class: Diagnosis of  . 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  . Muscle spasms of neck 04/04/2013  . 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 OA (osteoarthritis) of knee 02/22/2013  . Iliotibial band syndrome of left side 11/22/2012  . Localized, primary osteoarthritis of the ankle and foot 10/11/2012  . Difficulty in walking(719.7) 08/17/2012  . Peroneal tendinitis 08/16/2012  . Precordial pain 02/09/2012  . Essential hypertension, benign 02/09/2012  . Mixed hyperlipidemia 02/09/2012  . Dysthymia 02/04/2012  . Abnormality of gait 12/23/2011  . Lateral epicondylitis/tennis elbow 05/06/2011  . TENOSYNOVITIS OF FOOT AND ANKLE 10/22/2010   Ihor Austin, LPTA; CBIS [phone]  [place] 08/14/2016, [place], [place], [address] Phone: [phone]   Fax:  [phone]  Name: Zachary [name] MRN: [id number] Date of Birth: [birth year]

## 2016-08-17 ENCOUNTER — Ambulatory Visit (HOSPITAL_COMMUNITY): Payer: Medicare Other | Admitting: Physical Therapy

## 2016-08-17 DIAGNOSIS — R2681 Unsteadiness on feet: Secondary | ICD-10-CM

## 2016-08-17 DIAGNOSIS — M545 Low back pain: Secondary | ICD-10-CM | POA: Diagnosis not present

## 2016-08-17 DIAGNOSIS — M6281 Muscle weakness (generalized): Secondary | ICD-10-CM

## 2016-08-17 DIAGNOSIS — R29898 Other symptoms and signs involving the musculoskeletal system: Secondary | ICD-10-CM | POA: Diagnosis not present

## 2016-08-17 DIAGNOSIS — R262 Difficulty in walking, not elsewhere classified: Secondary | ICD-10-CM

## 2016-08-17 NOTE — Therapy (Signed)
[facility] [address] [place], [place], [address] Phone: [phone]   Fax:  [phone]  Physical Therapy Treatment  Patient Details  Name: Zachary [name] MRN: [id number] Date of Birth: [birth year] Referring Provider: Veleta Miners  Encounter Date: 08/17/2016      PT End of Session - 08/17/16 1557    Visit Number 3   Number of Visits 16   Date for PT Re-Evaluation 09/09/16   Authorization Type Medicare/Aetna Senior Supplement    Authorization - Visit Number 3   Authorization - Number of Visits 10   PT Start Time 1350   PT Stop Time 1435   PT Time Calculation (min) 45 min   Equipment Utilized During Treatment Back brace;Gait belt   Activity Tolerance Patient tolerated treatment well   Behavior During Therapy [facility] for tasks assessed/performed      Past Medical History:  Diagnosis Date  . Anxiety    takes Valium as needed  . Arthritis   . Bladder cancer ([place])    takes Rapaflo daily  . Blood dyscrasia    07/08/16: pt told he was a "free bleeder" after bleeding a lot when derm cut off mole on forehead. No excessive bleeding with minor wounds at home, no bleeding problems perioperatively with prior surgeries.  . Chronic back pain    spondylolisthesis  . Cluster headaches   . Depression    takes Lexapro daily  . Essential hypertension, benign    takes Diovan-HCT daily  . Joint pain   . Obstructive sleep apnea on CPAP    uses CPAP @ night  . Pneumonia    "several times"--last time about 3-4 yrs ago  . PONV (postoperative nausea and vomiting)    Hx: of "sometimes"  . Prediabetes     Past Surgical History:  Procedure Laterality Date  . ANTERIOR CERVICAL DECOMP/DISCECTOMY FUSION N/A 06/05/2013   Procedure:  C3-4 Anterior Cervical Discectomy and Fusion, Allograft, Plate;  Surgeon: [name], MD;  Location: [facility];  Service: Orthopedics;  Laterality: N/A;  C3-4 Anterior Cervical Discectomy and Fusion, Allograft, Plate  .  Arthroscopic knee surgery    . BACK SURGERY      x 2  . BLADDER SURGERY     x 5 to remove tumor  . CARPAL TUNNEL RELEASE    . CATARACT EXTRACTION W/PHACO  12/19/2012   Procedure: CATARACT EXTRACTION PHACO AND INTRAOCULAR LENS PLACEMENT (IOC);  Surgeon: [name], MD;  Location: [facility];  Service: Ophthalmology;  Laterality: Left;  CDE: 26.80  . CATARACT EXTRACTION W/PHACO  01/09/2013   Procedure: CATARACT EXTRACTION PHACO AND INTRAOCULAR LENS PLACEMENT (IOC);  Surgeon: [name], MD;  Location: [facility];  Service: Ophthalmology;  Laterality: Right;  CDE:22.83  . CERVICAL FUSION  06/05/2013   C 3  C4  . CYSTOSCOPY    . REFRACTIVE SURGERY     Hx: of  . RETINAL DETACHMENT SURGERY    . TRANSURETHRAL RESECTION OF BLADDER TUMOR Right 07/27/2015   Procedure: TRANSURETHRAL RESECTION OF BLADDER TUMOR (TURBT);  Surgeon: [name], MD;  Location: [facility];  Service: Urology;  Laterality: Right;    There were no vitals filed for this visit.      Subjective Assessment - 08/17/16 1359    Subjective Pt states his pain is about 3/10.  States he changed the [employer] and may have aggrevated it.  Pt states he returns to MD on Thursday.   Currently in Pain? Yes  Pain Score 3    Pain Location Back   Pain Orientation Lower   Pain Descriptors / Indicators Aching                         [facility] Adult PT Treatment/Exercise - 08/17/16 0001      Lumbar Exercises: Stretches   Active Hamstring Stretch 30 seconds;2 reps   Active Hamstring Stretch Limitations each LE onto 12" box     Lumbar Exercises: Standing   Heel Raises 15 reps   Heel Raises Limitations Toe raises 15x   Functional Squats 15 reps   Forward Lunge 15 reps   Forward Lunge Limitations 4" step   Other Standing Lumbar Exercises SLS: Lt: 8", Rt:12", tandem stance 2X30" each   Other Standing Lumbar Exercises stairs 4" with 1 HR 2RT reciprocally     Lumbar Exercises: Seated   Sit to Stand 10 reps   Sit to  Stand Limitations eccentric control             Balance Exercises - 08/17/16 1555      Balance Exercises: Standing   Tandem Gait 2 reps   Retro Gait 2 reps   Sidestepping 2 reps             PT Short Term Goals - 08/12/16 1236      PT SHORT TERM GOAL #1   Title Patient will be able to ambulate at least 645ft with SPC during 3MWT in order to demonstrate improved mobility and safety with community ambulation    Time 4   Period Weeks   Status New     PT SHORT TERM GOAL #2   Title Patient to be able to perform TUG test in 10 seconds or less with SPC to show improved mobility and reduced fall risk    Time 4   Period Weeks   Status New     PT SHORT TERM GOAL #3   Title Patient to be participatory in regular walking program, at least 5 days per week and at least 20 minutes in duration in order to build functional activity tolerance and improve mobility    Time 4   Period Weeks   Status New     PT SHORT TERM GOAL #4   Title Patient to be independent in correctly and consistently performing appropriate HEP, to be updated PRN    Time 4   Period Weeks   Status New           PT Long Term Goals - 08/12/16 1237      PT LONG TERM GOAL #1   Title Patient to demonstrate strength 5/5 in all tested muscle groups in order to facilitate improved mobility within community    Time 8   Period Weeks   Status New     PT LONG TERM GOAL #2   Title Patient to be able to maintain SLS for 30 seconds each LE in order to show improved balance and reduced fall risk    Time 8   Period Weeks   Status New     PT LONG TERM GOAL #3   Title Patient to be able to ambulate unlimited distances with no assistive device and minimal fatigue or unsteadiness in order to faciltiate safe return to community based activities    Time 8   Period Weeks   Status New     PT LONG TERM GOAL #4   Title Patient to be participatory  in regular aerobic exercise program, at least 3 days per week, at least  20-30 minutes in duration, in order to maintain functional gains and to improve overall health status    Time 8   Period Weeks   Status New               Plan - 08/17/16 1558    Clinical Impression Statement Continued with focus onimproving LE strength, posture and balance.  Pt able to tandem stance for 30", however noted fatigue after 15" and difficulty keeping in good posture.  Pt requires cues with all actvities to improve posture and prevent forward bent positioning.  Added SLS with Rt LE able to maintain for 12 seconds max, Lt 8 seconds. Began dynamic balance activities as well with patient requring intermittent assit to correct LOB.  Pt with overall fatigue reported at EOS requiring rest break before leaving session.     Rehab Potential Good   PT Frequency 2x / week   PT Duration 8 weeks   PT Treatment/Interventions ADLs/Self Care Home Management;Cryotherapy;Moist Heat;DME Instruction;Gait training;Stair training;Functional mobility training;Therapeutic activities;Therapeutic exercise;Balance training;Neuromuscular re-education;Patient/family education;Manual techniques;Passive range of motion;Energy conservation;Taping   PT Next Visit Plan  focus on balance, functional strength, functional activity tolerance. NO BENDING, LIFTING, TWISTING (BLT PRECAUTIONS).    PT Home Exercise Plan no updates this session      Patient will benefit from skilled therapeutic intervention in order to improve the following deficits and impairments:  Abnormal gait, Improper body mechanics, Pain, Decreased coordination, Decreased mobility, Postural dysfunction, Decreased activity tolerance, Decreased strength, Decreased balance, Difficulty walking, Impaired flexibility  Visit Diagnosis: Midline low back pain, with sciatica presence unspecified  Muscle weakness (generalized)  Unsteadiness on feet  Difficulty in walking, not elsewhere classified  Other symptoms and signs involving the  musculoskeletal system     Problem List Patient Active Problem List   Diagnosis Date Noted  . Postural hypotension 07/23/2016  . Dysphagia 07/23/2016  . Obstructive sleep apnea 07/21/2016  . Hyperglycemia 07/21/2016  . Anemia, unspecified 07/21/2016  . Spinal stenosis of lumbar region 07/15/2016  . Bladder tumor 07/27/2015  . HNP (herniated nucleus pulposus), cervical 06/05/2013    Class: Diagnosis of  . 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  . Muscle spasms of neck 04/04/2013  . 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 OA (osteoarthritis) of knee 02/22/2013  . Iliotibial band syndrome of left side 11/22/2012  . Localized, primary osteoarthritis of the ankle and foot 10/11/2012  . Difficulty in walking(719.7) 08/17/2012  . Peroneal tendinitis 08/16/2012  . Precordial pain 02/09/2012  . Essential hypertension, benign 02/09/2012  . Mixed hyperlipidemia 02/09/2012  . Dysthymia 02/04/2012  . Abnormality of gait 12/23/2011  . Lateral epicondylitis/tennis elbow 05/06/2011  . TENOSYNOVITIS OF FOOT AND ANKLE 10/22/2010    Teena Irani, PTA/CLT [phone]  08/17/2016, 4:05 PM  [facility] [address] [place], [place], [address] Phone: [phone]   Fax:  [phone]  Name: Zachary [name] MRN: [id number] Date of Birth: Jul 25, 1946

## 2016-08-19 ENCOUNTER — Ambulatory Visit (HOSPITAL_COMMUNITY): Payer: Medicare Other | Admitting: Physical Therapy

## 2016-08-19 DIAGNOSIS — R2681 Unsteadiness on feet: Secondary | ICD-10-CM

## 2016-08-19 DIAGNOSIS — M6281 Muscle weakness (generalized): Secondary | ICD-10-CM

## 2016-08-19 DIAGNOSIS — R262 Difficulty in walking, not elsewhere classified: Secondary | ICD-10-CM | POA: Diagnosis not present

## 2016-08-19 DIAGNOSIS — I10 Essential (primary) hypertension: Secondary | ICD-10-CM | POA: Diagnosis not present

## 2016-08-19 DIAGNOSIS — Z9889 Other specified postprocedural states: Secondary | ICD-10-CM | POA: Diagnosis not present

## 2016-08-19 DIAGNOSIS — R29898 Other symptoms and signs involving the musculoskeletal system: Secondary | ICD-10-CM

## 2016-08-19 DIAGNOSIS — D649 Anemia, unspecified: Secondary | ICD-10-CM | POA: Diagnosis not present

## 2016-08-19 DIAGNOSIS — D508 Other iron deficiency anemias: Secondary | ICD-10-CM | POA: Diagnosis not present

## 2016-08-19 DIAGNOSIS — M545 Low back pain: Secondary | ICD-10-CM | POA: Diagnosis not present

## 2016-08-19 NOTE — Therapy (Signed)
[facility] [address] [place], [place], [address] Phone: [phone]   Fax:  [phone]  Physical Therapy Treatment  Patient Details  Name: Zachary [name] MRN: [id number] Date of Birth: Jul 14, 1946 Referring Provider: Veleta Miners  Encounter Date: 08/19/2016      PT End of Session - 08/19/16 1541    Visit Number 4   Number of Visits 16   Date for PT Re-Evaluation 09/09/16   Authorization Type Medicare/Aetna Senior Supplement    Authorization - Visit Number 4   Authorization - Number of Visits 10   PT Start Time [id number]   PT Stop Time 1435   PT Time Calculation (min) 47 min   Equipment Utilized During Treatment Back brace;Gait belt   Activity Tolerance Patient tolerated treatment well   Behavior During Therapy [facility] for tasks assessed/performed      Past Medical History:  Diagnosis Date  . Anxiety    takes Valium as needed  . Arthritis   . Bladder cancer ([place])    takes Rapaflo daily  . Blood dyscrasia    07/08/16: pt told he was a "free bleeder" after bleeding a lot when derm cut off mole on forehead. No excessive bleeding with minor wounds at home, no bleeding problems perioperatively with prior surgeries.  . Chronic back pain    spondylolisthesis  . Cluster headaches   . Depression    takes Lexapro daily  . Essential hypertension, benign    takes Diovan-HCT daily  . Joint pain   . Obstructive sleep apnea on CPAP    uses CPAP @ night  . Pneumonia    "several times"--last time about 3-4 yrs ago  . PONV (postoperative nausea and vomiting)    Hx: of "sometimes"  . Prediabetes     Past Surgical History:  Procedure Laterality Date  . ANTERIOR CERVICAL DECOMP/DISCECTOMY FUSION N/A 06/05/2013   Procedure:  C3-4 Anterior Cervical Discectomy and Fusion, Allograft, Plate;  Surgeon: [name], MD;  Location: [facility];  Service: Orthopedics;  Laterality: N/A;  C3-4 Anterior Cervical Discectomy and Fusion, Allograft, Plate  .  Arthroscopic knee surgery    . BACK SURGERY      x 2  . BLADDER SURGERY     x 5 to remove tumor  . CARPAL TUNNEL RELEASE    . CATARACT EXTRACTION W/PHACO  12/19/2012   Procedure: CATARACT EXTRACTION PHACO AND INTRAOCULAR LENS PLACEMENT (IOC);  Surgeon: [name], MD;  [facility];  Service: Ophthalmology;  Laterality: Left;  CDE: 26.80  . CATARACT EXTRACTION W/PHACO  01/09/2013   Procedure: CATARACT EXTRACTION PHACO AND INTRAOCULAR LENS PLACEMENT (IOC);  Surgeon: [name], MD;  [facility];  Service: Ophthalmology;  Laterality: Right;  CDE:22.83  . CERVICAL FUSION  06/05/2013   C 3  C4  . CYSTOSCOPY    . REFRACTIVE SURGERY     Hx: of  . RETINAL DETACHMENT SURGERY    . TRANSURETHRAL RESECTION OF BLADDER TUMOR Right 07/27/2015   Procedure: TRANSURETHRAL RESECTION OF BLADDER TUMOR (TURBT);  Surgeon: [name], MD;  [facility];  Service: Urology;  Laterality: Right;    There were no vitals filed for this visit.      Subjective Assessment - 08/19/16 1353    Subjective Pt states he is not having any more pain than usual.  States 3/10 in Rt hip with certain activities.  States he can tell his legs are getting more toned.     Currently  in Pain? Yes   Pain Score 3    Pain Location Back   Pain Orientation Lower   Pain Descriptors / Indicators Aching                         [facility] Adult PT Treatment/Exercise - 08/19/16 0001      Lumbar Exercises: Stretches   Active Hamstring Stretch 30 seconds;2 reps   Active Hamstring Stretch Limitations each LE onto 12" box     Lumbar Exercises: Standing   Heel Raises 15 reps   Heel Raises Limitations Toe raises 15x   Functional Squats 15 reps   Forward Lunge 15 reps   Forward Lunge Limitations 4" step   Other Standing Lumbar Exercises SLS: Lt: 8", Rt:12", tandem stance 2X30" each   Other Standing Lumbar Exercises standing hip abd/extension 10 reps each     Lumbar Exercises: Seated   Sit to Stand 15  reps   Sit to Stand Limitations eccentric control             Balance Exercises - 08/19/16 1541      Balance Exercises: Standing   Tandem Gait 2 reps   Retro Gait 2 reps             PT Short Term Goals - 08/12/16 1236      PT SHORT TERM GOAL #1   Title Patient will be able to ambulate at least 682ft with SPC during 3MWT in order to demonstrate improved mobility and safety with community ambulation    Time 4   Period Weeks   Status New     PT SHORT TERM GOAL #2   Title Patient to be able to perform TUG test in 10 seconds or less with SPC to show improved mobility and reduced fall risk    Time 4   Period Weeks   Status New     PT SHORT TERM GOAL #3   Title Patient to be participatory in regular walking program, at least 5 days per week and at least 20 minutes in duration in order to build functional activity tolerance and improve mobility    Time 4   Period Weeks   Status New     PT SHORT TERM GOAL #4   Title Patient to be independent in correctly and consistently performing appropriate HEP, to be updated PRN    Time 4   Period Weeks   Status New           PT Long Term Goals - 08/12/16 1237      PT LONG TERM GOAL #1   Title Patient to demonstrate strength 5/5 in all tested muscle groups in order to facilitate improved mobility within community    Time 8   Period Weeks   Status New     PT LONG TERM GOAL #2   Title Patient to be able to maintain SLS for 30 seconds each LE in order to show improved balance and reduced fall risk    Time 8   Period Weeks   Status New     PT LONG TERM GOAL #3   Title Patient to be able to ambulate unlimited distances with no assistive device and minimal fatigue or unsteadiness in order to faciltiate safe return to community based activities    Time 8   Period Weeks   Status New     PT LONG TERM GOAL #4   Title Patient to be participatory in  regular aerobic exercise program, at least 3 days per week, at least 20-30  minutes in duration, in order to maintain functional gains and to improve overall health status    Time 8   Period Weeks   Status New               Plan - 08/19/16 1542    Clinical Impression Statement Noted improvement in LE strength this session with less shakiness of legs and only 1 seated rest break needed during session.  Progressed reps of STS and added standing hip abd/extension to POC.  Improved ability to keep balance with tandem gait as well this session.   Instructed pateint to buy a longer reacher to prevent bending forward to floor.    Rehab Potential Good   PT Frequency 2x / week   PT Duration 8 weeks   PT Treatment/Interventions ADLs/Self Care Home Management;Cryotherapy;Moist Heat;DME Instruction;Gait training;Stair training;Functional mobility training;Therapeutic activities;Therapeutic exercise;Balance training;Neuromuscular re-education;Patient/family education;Manual techniques;Passive range of motion;Energy conservation;Taping   PT Next Visit Plan  focus on balance, functional strength, functional activity tolerance. NO BENDING, LIFTING, TWISTING (BLT PRECAUTIONS).    PT Home Exercise Plan no updates this session      Patient will benefit from skilled therapeutic intervention in order to improve the following deficits and impairments:  Abnormal gait, Improper body mechanics, Pain, Decreased coordination, Decreased mobility, Postural dysfunction, Decreased activity tolerance, Decreased strength, Decreased balance, Difficulty walking, Impaired flexibility  Visit Diagnosis: Midline low back pain, with sciatica presence unspecified  Muscle weakness (generalized)  Unsteadiness on feet  Difficulty in walking, not elsewhere classified  Other symptoms and signs involving the musculoskeletal system     Problem List Patient Active Problem List   Diagnosis Date Noted  . Postural hypotension 07/23/2016  . Dysphagia 07/23/2016  . Obstructive sleep apnea  07/21/2016  . Hyperglycemia 07/21/2016  . Anemia, unspecified 07/21/2016  . Spinal stenosis of lumbar region 07/15/2016  . Bladder tumor 07/27/2015  . HNP (herniated nucleus pulposus), cervical 06/05/2013    Class: Diagnosis of  . 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  . Muscle spasms of neck 04/04/2013  . 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 OA (osteoarthritis) of knee 02/22/2013  . Iliotibial band syndrome of left side 11/22/2012  . Localized, primary osteoarthritis of the ankle and foot 10/11/2012  . Difficulty in walking(719.7) 08/17/2012  . Peroneal tendinitis 08/16/2012  . Precordial pain 02/09/2012  . Essential hypertension, benign 02/09/2012  . Mixed hyperlipidemia 02/09/2012  . Dysthymia 02/04/2012  . Abnormality of gait 12/23/2011  . Lateral epicondylitis/tennis elbow 05/06/2011  . TENOSYNOVITIS OF FOOT AND ANKLE 10/22/2010    Teena Irani, PTA/CLT [phone]  08/19/2016, 3:44 PM  [facility] [address] [place], [place], [address] Phone: [phone]   Fax:  [phone]  Name: Zachary [name] MRN: [id number] Date of Birth: Jan 20, 1946

## 2016-08-20 DIAGNOSIS — M4806 Spinal stenosis, lumbar region: Secondary | ICD-10-CM | POA: Diagnosis not present

## 2016-08-25 ENCOUNTER — Ambulatory Visit (HOSPITAL_COMMUNITY): Payer: Medicare Other | Admitting: Physical Therapy

## 2016-08-25 DIAGNOSIS — R29898 Other symptoms and signs involving the musculoskeletal system: Secondary | ICD-10-CM

## 2016-08-25 DIAGNOSIS — M545 Low back pain: Secondary | ICD-10-CM

## 2016-08-25 DIAGNOSIS — R2681 Unsteadiness on feet: Secondary | ICD-10-CM

## 2016-08-25 DIAGNOSIS — R262 Difficulty in walking, not elsewhere classified: Secondary | ICD-10-CM

## 2016-08-25 DIAGNOSIS — I10 Essential (primary) hypertension: Secondary | ICD-10-CM | POA: Diagnosis not present

## 2016-08-25 DIAGNOSIS — M6281 Muscle weakness (generalized): Secondary | ICD-10-CM

## 2016-08-25 NOTE — Therapy (Signed)
[facility] [place], [place], [address] Phone: [phone]   Fax:  [phone]  Patient Details  Name: Zachary [name] MRN: [id number] Date of Birth: [birth year] Referring Provider:  Virgie Dad, MD  Encounter Date: 08/25/2016  Pt came into therapy with noted bilateral edema and reported high BP.  Pt with no other distress or pain.  BP seated in Lt UE 170/90; had patient sit X 8 minutes and retook at 167/89.  Consulted PT, Tawni Levy) and recommended patient hold PT today and see MD regarding edema and BP.  Called MD office [facility]) and left clinical message regarding findings.  Pt to go to office from clinic and see if he can be seen.  Instructed patient to call clinic with any updates or MD instructions.   Teena Irani, PTA/CLT [phone]  08/25/2016, 10:54 AM  [facility] [place], [place], [address] Phone: [phone]   Fax:  [phone]

## 2016-08-27 ENCOUNTER — Telehealth (HOSPITAL_COMMUNITY): Payer: Self-pay | Admitting: Physical Therapy

## 2016-08-27 ENCOUNTER — Ambulatory Visit (HOSPITAL_COMMUNITY): Payer: Medicare Other | Admitting: Physical Therapy

## 2016-08-27 NOTE — Telephone Encounter (Signed)
Called and spoke to patient, who reports that he literally just called and left a message saying he is cancelling today's appointment. He did go to his MD after leaving PT clinic last session, and they want him to hold off of PT until they are able to find out what is causing the swelling in his B LEs.   Patient states that he will keep his other scheduled appointments as is, and will call the day of to let us know if he needs to cancel.  Will count today as a cancellation rather than a no-show as patient did ultimately call clinic.    Deniece Ree PT, DPT [phone]

## 2016-08-27 NOTE — Telephone Encounter (Signed)
Pt l/m on 08/27/16  9:15am states he is not to do PT again until he sees MD on Friday. NF 08/27/16

## 2016-08-28 DIAGNOSIS — I10 Essential (primary) hypertension: Secondary | ICD-10-CM | POA: Diagnosis not present

## 2016-09-01 ENCOUNTER — Telehealth (HOSPITAL_COMMUNITY): Payer: Self-pay

## 2016-09-01 ENCOUNTER — Ambulatory Visit (HOSPITAL_COMMUNITY): Payer: Medicare Other | Attending: Internal Medicine

## 2016-09-01 DIAGNOSIS — R29898 Other symptoms and signs involving the musculoskeletal system: Secondary | ICD-10-CM | POA: Insufficient documentation

## 2016-09-01 DIAGNOSIS — M545 Low back pain: Secondary | ICD-10-CM | POA: Insufficient documentation

## 2016-09-01 DIAGNOSIS — R2681 Unsteadiness on feet: Secondary | ICD-10-CM | POA: Insufficient documentation

## 2016-09-01 DIAGNOSIS — R262 Difficulty in walking, not elsewhere classified: Secondary | ICD-10-CM | POA: Diagnosis not present

## 2016-09-01 DIAGNOSIS — M6281 Muscle weakness (generalized): Secondary | ICD-10-CM | POA: Insufficient documentation

## 2016-09-01 NOTE — Telephone Encounter (Signed)
09/01/16 pt had left [place] a message on 9/4 and our office was closed.  He said that he would be at his 9/5 appt.... The last time he was here he had some swelling in his legs and wasn't able to complete therapy but would be here and wanted [place] to call him back to say we did get his message.  I called him this morning at 8:23 and got his voice mail but I did leave a message to let him know we received his message and would hopefully see him this morning at 9:45.

## 2016-09-01 NOTE — Therapy (Signed)
[place], [place], [address] Phone: [phone]   Fax:  [phone]  Physical Therapy Treatment  Patient Details  Name: Zachary [name] MRN: [id number] Date of Birth: 01-23-1946 Referring Provider: Veleta Miners  Encounter Date: 09/01/2016      PT End of Session - 09/01/16 1033    Visit Number 5   Number of Visits 16   Date for PT Re-Evaluation 09/09/16   Authorization Type Medicare/Aetna Senior Supplement    Authorization Time Period 08/12/16 to 10/12/16   Authorization - Visit Number 5   Authorization - Number of Visits 10   PT Start Time 0948   PT Stop Time 1032   PT Time Calculation (min) 44 min   Equipment Utilized During Treatment Gait belt;Back brace   Activity Tolerance Patient tolerated treatment well;Patient limited by fatigue   Behavior During Therapy [facility] for tasks assessed/performed;Flat affect      Past Medical History:  Diagnosis Date  . Anxiety    takes Valium as needed  . Arthritis   . Bladder cancer ([place])    takes Rapaflo daily  . Blood dyscrasia    07/08/16: pt told he was a "free bleeder" after bleeding a lot when derm cut off mole on forehead. No excessive bleeding with minor wounds at home, no bleeding problems perioperatively with prior surgeries.  . Chronic back pain    spondylolisthesis  . Cluster headaches   . Depression    takes Lexapro daily  . Essential hypertension, benign    takes Diovan-HCT daily  . Joint pain   . Obstructive sleep apnea on CPAP    uses CPAP @ night  . Pneumonia    "several times"--last time about 3-4 yrs ago  . PONV (postoperative nausea and vomiting)    Hx: of "sometimes"  . Prediabetes     Past Surgical History:  Procedure Laterality Date  . ANTERIOR CERVICAL DECOMP/DISCECTOMY FUSION N/A 06/05/2013   Procedure:  C3-4 Anterior Cervical Discectomy and Fusion, Allograft, Plate;  Surgeon: [name], MD;  Location: [place];  Service: Orthopedics;   Laterality: N/A;  C3-4 Anterior Cervical Discectomy and Fusion, Allograft, Plate  . Arthroscopic knee surgery    . BACK SURGERY      x 2  . BLADDER SURGERY     x 5 to remove tumor  . CARPAL TUNNEL RELEASE    . CATARACT EXTRACTION W/PHACO  12/19/2012   Procedure: CATARACT EXTRACTION PHACO AND INTRAOCULAR LENS PLACEMENT (IOC);  Surgeon: [name], MD;  [facility];  Service: Ophthalmology;  Laterality: Left;  CDE: 26.80  . CATARACT EXTRACTION W/PHACO  01/09/2013   Procedure: CATARACT EXTRACTION PHACO AND INTRAOCULAR LENS PLACEMENT (IOC);  Surgeon: [name], MD;  Location: [facility];  Service: Ophthalmology;  Laterality: Right;  CDE:22.83  . CERVICAL FUSION  06/05/2013   C 3  C4  . CYSTOSCOPY    . REFRACTIVE SURGERY     Hx: of  . RETINAL DETACHMENT SURGERY    . TRANSURETHRAL RESECTION OF BLADDER TUMOR Right 07/27/2015   Procedure: TRANSURETHRAL RESECTION OF BLADDER TUMOR (TURBT);  Surgeon: [name], MD;  Location: [facility];  Service: Urology;  Laterality: Right;    There were no vitals filed for this visit.      Subjective Assessment - 09/01/16 0954    Subjective Pt doing well, he saw the doctor last week due to elevated BP and bil LEE. He reports he is now back on his  BP meds but cannot say for sure what the name and dosage are. His LEE has resolved somewhat. He continues to be active with football team and duties, but has not really been consistent with his HEP.       Pertinent History HTN, OSA, hypotension, history of cervical HNP, anxiety, pre-diabetic, history of cervical and lumbar surgeries    Currently in Pain? No/denies                         [facility] Adult PT Treatment/Exercise - 09/01/16 0001      Ambulation/Gait   Ambulation Distance (Feet) 225 Feet  2'07"   Gait velocity 0.23m/s     High Level Balance   High Level Balance Activities Side stepping;Backward walking;Negotiating over obstacles   High Level Balance Comments Bil side step: 2x20;  fwd/retro AMB uneven, unlevel surface 2x10  Grass,Gravel, Mud.      Exercises   Exercises Lumbar;Ankle     Lumbar Exercises: Standing   Other Standing Lumbar Exercises Fwd Step up, 4", 0 hands @ CGA 1x10 bil  dynamic balance/strengthening   Other Standing Lumbar Exercises Fwd Step up, 4", 0 hands @ CGA 1x10 bil  dynamic balance/strengthening     Ankle Exercises: Stretches   Slant Board Stretch 3 reps;30 seconds  Ankle DF very limited; edge of // bar platform only     Ankle Exercises: Seated   Toe Raise 15 reps;3 seconds  2x15x3sHold                PT Education - 09/01/16 1032    Education provided Yes   Education Details Importance of compliance with HEP in additional to walking activity.    Person(s) Educated Patient   Methods Explanation   Comprehension Verbalized understanding          PT Short Term Goals - 08/12/16 1236      PT SHORT TERM GOAL #1   Title Patient will be able to ambulate at least 653ft with SPC during 3MWT in order to demonstrate improved mobility and safety with community ambulation    Time 4   Period Weeks   Status New     PT SHORT TERM GOAL #2   Title Patient to be able to perform TUG test in 10 seconds or less with SPC to show improved mobility and reduced fall risk    Time 4   Period Weeks   Status New     PT SHORT TERM GOAL #3   Title Patient to be participatory in regular walking program, at least 5 days per week and at least 20 minutes in duration in order to build functional activity tolerance and improve mobility    Time 4   Period Weeks   Status New     PT SHORT TERM GOAL #4   Title Patient to be independent in correctly and consistently performing appropriate HEP, to be updated PRN    Time 4   Period Weeks   Status New           PT Long Term Goals - 08/12/16 1237      PT LONG TERM GOAL #1   Title Patient to demonstrate strength 5/5 in all tested muscle groups in order to facilitate improved mobility within  community    Time 8   Period Weeks   Status New     PT LONG TERM GOAL #2   Title Patient to be able to maintain SLS for  30 seconds each LE in order to show improved balance and reduced fall risk    Time 8   Period Weeks   Status New     PT LONG TERM GOAL #3   Title Patient to be able to ambulate unlimited distances with no assistive device and minimal fatigue or unsteadiness in order to faciltiate safe return to community based activities    Time 8   Period Weeks   Status New     PT LONG TERM GOAL #4   Title Patient to be participatory in regular aerobic exercise program, at least 3 days per week, at least 20-30 minutes in duration, in order to maintain functional gains and to improve overall health status    Time 8   Period Weeks   Status New               Plan - 09/01/16 1036    Clinical Impression Statement Pt BP this session 142/82 mmHg at beginning of session, indicative of significant improvement from last session; LEE also noted to be much improved. Started session with progressed level of dynamic balalnce/technical gait training, with 4 directional AMB on unevern grass, gravel, mud in courtyard; pt noted to demonstrated difficulty performing active DF in gait cycle, hence calf stretch also added thereafter with DF isometrics. Addition dynamic SLS progression moved to FWD step-ups and side step-ups at CGA, with multiple significant LOB requiring MinA to correct. Balance/postural deficits continue to remain somewhat liimited, however pt is making progress toward goals. t urged to be more consistent with HEP, rather than a simple heavy emphasis on walking during IADL.  Gait speed remains somewhat limited.    Rehab Potential Good   PT Frequency 2x / week   PT Duration 8 weeks   PT Treatment/Interventions ADLs/Self Care Home Management;Cryotherapy;Moist Heat;DME Instruction;Gait training;Stair training;Functional mobility training;Therapeutic activities;Therapeutic  exercise;Balance training;Neuromuscular re-education;Patient/family education;Manual techniques;Passive range of motion;Energy conservation;Taping   PT Next Visit Plan  Continue focus on balance, functional strength, functional activity tolerance. NO BENDING, LIFTING, TWISTING (BLT PRECAUTIONS).    PT Home Exercise Plan Consider addition of some semitandem balance to HEP next session.    Consulted and Agree with Plan of Care Patient      Patient will benefit from skilled therapeutic intervention in order to improve the following deficits and impairments:  Abnormal gait, Improper body mechanics, Pain, Decreased coordination, Decreased mobility, Postural dysfunction, Decreased activity tolerance, Decreased strength, Decreased balance, Difficulty walking, Impaired flexibility  Visit Diagnosis: Midline low back pain, with sciatica presence unspecified  Muscle weakness (generalized)  Unsteadiness on feet  Difficulty in walking, not elsewhere classified  Other symptoms and signs involving the musculoskeletal system     Problem List Patient Active Problem List   Diagnosis Date Noted  . Postural hypotension 07/23/2016  . Dysphagia 07/23/2016  . Obstructive sleep apnea 07/21/2016  . Hyperglycemia 07/21/2016  . Anemia, unspecified 07/21/2016  . Spinal stenosis of lumbar region 07/15/2016  . Bladder tumor 07/27/2015  . HNP (herniated nucleus pulposus), cervical 06/05/2013    Class: Diagnosis of  . 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  . Muscle spasms of neck 04/04/2013  . 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 OA (osteoarthritis) of knee 02/22/2013  . Iliotibial band syndrome of left side 11/22/2012  . Localized, primary osteoarthritis of the ankle and foot 10/11/2012  . Difficulty in walking(719.7) 08/17/2012  . Peroneal tendinitis 08/16/2012  . Precordial pain 02/09/2012  . Essential hypertension, benign 02/09/2012  . Mixed hyperlipidemia 02/09/2012  .  Dysthymia  02/04/2012  . Abnormality of gait 12/23/2011  . Lateral epicondylitis/tennis elbow 05/06/2011  . TENOSYNOVITIS OF FOOT AND ANKLE 10/22/2010    10:43 AM, 09/01/16 Etta Grandchild, PT, DPT Physical Therapist at [facility] [phone] (office)     [place], [place], [address] Phone: [phone]   Fax:  [phone]  Name: MARQ MILIC MRN: [id number] Date of Birth: Mar 27, 1946

## 2016-09-03 ENCOUNTER — Ambulatory Visit (HOSPITAL_COMMUNITY): Payer: Medicare Other

## 2016-09-03 ENCOUNTER — Telehealth (HOSPITAL_COMMUNITY): Payer: Self-pay

## 2016-09-03 DIAGNOSIS — M545 Low back pain: Secondary | ICD-10-CM

## 2016-09-03 DIAGNOSIS — R262 Difficulty in walking, not elsewhere classified: Secondary | ICD-10-CM

## 2016-09-03 DIAGNOSIS — R2681 Unsteadiness on feet: Secondary | ICD-10-CM | POA: Diagnosis not present

## 2016-09-03 DIAGNOSIS — M6281 Muscle weakness (generalized): Secondary | ICD-10-CM | POA: Diagnosis not present

## 2016-09-03 DIAGNOSIS — R29898 Other symptoms and signs involving the musculoskeletal system: Secondary | ICD-10-CM | POA: Diagnosis not present

## 2016-09-03 NOTE — Telephone Encounter (Signed)
Patient canceled his appt for 09-08-16

## 2016-09-03 NOTE — Therapy (Signed)
[place], [place], [address] Phone: [phone]   Fax:  [phone]  Physical Therapy Treatment  Patient Details  Name: Zachary [name] MRN: [id number] Date of Birth: 1946/10/13 Referring Provider: Veleta Miners  Encounter Date: 09/03/2016      PT End of Session - 09/03/16 1232    Visit Number 6   Number of Visits 16   Date for PT Re-Evaluation 09/09/16   Authorization Type Medicare/Aetna Senior Supplement    Authorization Time Period 08/12/16 to 10/12/16   Authorization - Visit Number 6   Authorization - Number of Visits 10   PT Start Time 1033   PT Stop Time 1120   PT Time Calculation (min) 47 min   Equipment Utilized During Treatment Gait belt   Activity Tolerance Patient tolerated treatment well;Patient limited by fatigue   Behavior During Therapy [facility] for tasks assessed/performed;Flat affect      Past Medical History:  Diagnosis Date  . Anxiety    takes Valium as needed  . Arthritis   . Bladder cancer ([place])    takes Rapaflo daily  . Blood dyscrasia    07/08/16: pt told he was a "free bleeder" after bleeding a lot when derm cut off mole on forehead. No excessive bleeding with minor wounds at home, no bleeding problems perioperatively with prior surgeries.  . Chronic back pain    spondylolisthesis  . Cluster headaches   . Depression    takes Lexapro daily  . Essential hypertension, benign    takes Diovan-HCT daily  . Joint pain   . Obstructive sleep apnea on CPAP    uses CPAP @ night  . Pneumonia    "several times"--last time about 3-4 yrs ago  . PONV (postoperative nausea and vomiting)    Hx: of "sometimes"  . Prediabetes     Past Surgical History:  Procedure Laterality Date  . ANTERIOR CERVICAL DECOMP/DISCECTOMY FUSION N/A 06/05/2013   Procedure:  C3-4 Anterior Cervical Discectomy and Fusion, Allograft, Plate;  Surgeon: [name], MD;  Location: [place];  Service: Orthopedics;  Laterality:  N/A;  C3-4 Anterior Cervical Discectomy and Fusion, Allograft, Plate  . Arthroscopic knee surgery    . BACK SURGERY      x 2  . BLADDER SURGERY     x 5 to remove tumor  . CARPAL TUNNEL RELEASE    . CATARACT EXTRACTION W/PHACO  12/19/2012   Procedure: CATARACT EXTRACTION PHACO AND INTRAOCULAR LENS PLACEMENT (IOC);  Surgeon: [name], MD;  [facility];  Service: Ophthalmology;  Laterality: Left;  CDE: 26.80  . CATARACT EXTRACTION W/PHACO  01/09/2013   Procedure: CATARACT EXTRACTION PHACO AND INTRAOCULAR LENS PLACEMENT (IOC);  Surgeon: [name], MD;  Location: [facility];  Service: Ophthalmology;  Laterality: Right;  CDE:22.83  . CERVICAL FUSION  06/05/2013   C 3  C4  . CYSTOSCOPY    . REFRACTIVE SURGERY     Hx: of  . RETINAL DETACHMENT SURGERY    . TRANSURETHRAL RESECTION OF BLADDER TUMOR Right 07/27/2015   Procedure: TRANSURETHRAL RESECTION OF BLADDER TUMOR (TURBT);  Surgeon: [name], MD;  [facility];  Service: Urology;  Laterality: Right;    There were no vitals filed for this visit.      Subjective Assessment - 09/03/16 1040    Subjective Pt is doing well today. He reports some soreness/pain immedaite following our last session, after which he ttok a pain pill and a nap. He  says the next day, he felt overall improved. COntinues to have paresthesia accross the top of both ankles, but no pain today.    Pertinent History HTN, OSA, hypotension, history of cervical HNP, anxiety, pre-diabetic, history of cervical and lumbar surgeries    Currently in Pain? No/denies                         [facility] Adult PT Treatment/Exercise - 09/03/16 0001      High Level Balance   High Level Balance Activities Side stepping;Backward walking;Negotiating over obstacles   High Level Balance Comments Bil side step: 3x20; fwd/retro AMB uneven, unlevel surface 3x10  Grass, Gravel, Mud.      Lumbar Exercises: Standing   Heel Raises Limitations Active Dorsiflexion seated:  2x20x1sH    Scapular Retraction Theraband;Strengthening;Other (comment)  2x10 GreenTB, narrow stance foam   Row Strengthening;20 reps;Theraband  2x10 GreenTB, narrow stance foam   Other Standing Lumbar Exercises Fwd Step up, 4", 0 hands @ CGA 1x10 bil  dynamic balance/strengthening   Other Standing Lumbar Exercises Lateral Step up, 4", 0 hands @ CGA 1x10 bil  dynamic balance/strengthening     Lumbar Exercises: Supine   Other Supine Lumbar Exercises Cervical retraction into 2 pillows: 15x3s   Other Supine Lumbar Exercises supine T's for scap retraction  1x15 (consider HEP and/or towel roll at t-spine     Ankle Exercises: Stretches   Slant Board Stretch 3 reps;30 seconds  Ankle DF very limited; edge of // bar platform only                  PT Short Term Goals - 08/12/16 1236      PT SHORT TERM GOAL #1   Title Patient will be able to ambulate at least 654ft with SPC during 3MWT in order to demonstrate improved mobility and safety with community ambulation    Time 4   Period Weeks   Status New     PT SHORT TERM GOAL #2   Title Patient to be able to perform TUG test in 10 seconds or less with SPC to show improved mobility and reduced fall risk    Time 4   Period Weeks   Status New     PT SHORT TERM GOAL #3   Title Patient to be participatory in regular walking program, at least 5 days per week and at least 20 minutes in duration in order to build functional activity tolerance and improve mobility    Time 4   Period Weeks   Status New     PT SHORT TERM GOAL #4   Title Patient to be independent in correctly and consistently performing appropriate HEP, to be updated PRN    Time 4   Period Weeks   Status New           PT Long Term Goals - 08/12/16 1237      PT LONG TERM GOAL #1   Title Patient to demonstrate strength 5/5 in all tested muscle groups in order to facilitate improved mobility within community    Time 8   Period Weeks   Status New     PT LONG  TERM GOAL #2   Title Patient to be able to maintain SLS for 30 seconds each LE in order to show improved balance and reduced fall risk    Time 8   Period Weeks   Status New     PT LONG TERM GOAL #3  Title Patient to be able to ambulate unlimited distances with no assistive device and minimal fatigue or unsteadiness in order to faciltiate safe return to community based activities    Time 8   Period Weeks   Status New     PT LONG TERM GOAL #4   Title Patient to be participatory in regular aerobic exercise program, at least 3 days per week, at least 20-30 minutes in duration, in order to maintain functional gains and to improve overall health status    Time 8   Period Weeks   Status New               Plan - 09/03/16 1233    Clinical Impression Statement Session continues focus on dynamic balance during gait on a variety of surfaces, progressing volume today, with additional acitivties combining postural corrective exercises on narrow stance foam. Balance is improving, espeically noted during sidestepping outside, but remains limited during step-ups activity. Tightness in calves remains very limiting to heel strike in gait and ankle rocker variability in the gait cycle, howeve alternate stretching/activation of ant compartment are facilitating these deficits. Progress toward goals is evident.    Rehab Potential Good   PT Frequency 2x / week   PT Duration 8 weeks   PT Treatment/Interventions ADLs/Self Care Home Management;Cryotherapy;Moist Heat;DME Instruction;Gait training;Stair training;Functional mobility training;Therapeutic activities;Therapeutic exercise;Balance training;Neuromuscular re-education;Patient/family education;Manual techniques;Passive range of motion;Energy conservation;Taping   PT Next Visit Plan ReAssessment: continue focus on balance, functional strength, functional activity tolerance. NO BENDING, LIFTING, TWISTING (BLT PRECAUTIONS).    PT Home Exercise Plan  Consider addition of tandem stance, supine neck retraction, and supine T-spine towel roll stretch to HEP.    Consulted and Agree with Plan of Care Patient      Patient will benefit from skilled therapeutic intervention in order to improve the following deficits and impairments:  Abnormal gait, Improper body mechanics, Pain, Decreased coordination, Decreased mobility, Postural dysfunction, Decreased activity tolerance, Decreased strength, Decreased balance, Difficulty walking, Impaired flexibility  Visit Diagnosis: Midline low back pain, with sciatica presence unspecified  Muscle weakness (generalized)  Unsteadiness on feet  Difficulty in walking, not elsewhere classified  Other symptoms and signs involving the musculoskeletal system     Problem List Patient Active Problem List   Diagnosis Date Noted  . Postural hypotension 07/23/2016  . Dysphagia 07/23/2016  . Obstructive sleep apnea 07/21/2016  . Hyperglycemia 07/21/2016  . Anemia, unspecified 07/21/2016  . Spinal stenosis of lumbar region 07/15/2016  . Bladder tumor 07/27/2015  . HNP (herniated nucleus pulposus), cervical 06/05/2013    Class: Diagnosis of  . 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  . Muscle spasms of neck 04/04/2013  . 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 OA (osteoarthritis) of knee 02/22/2013  . Iliotibial band syndrome of left side 11/22/2012  . Localized, primary osteoarthritis of the ankle and foot 10/11/2012  . Difficulty in walking(719.7) 08/17/2012  . Peroneal tendinitis 08/16/2012  . Precordial pain 02/09/2012  . Essential hypertension, benign 02/09/2012  . Mixed hyperlipidemia 02/09/2012  . Dysthymia 02/04/2012  . Abnormality of gait 12/23/2011  . Lateral epicondylitis/tennis elbow 05/06/2011  . TENOSYNOVITIS OF FOOT AND ANKLE 10/22/2010    12:38 PM, 09/03/16 Etta Grandchild, PT, DPT Physical Therapist at [facility] [phone] (office)     [place] [address] [place], [place], [address] Phone: [phone]   Fax:  [phone]  Name: Zachary [name] MRN: [id number] Date of  Birth: 04-Aug-1946

## 2016-09-08 ENCOUNTER — Ambulatory Visit (HOSPITAL_COMMUNITY): Payer: Self-pay | Admitting: Physical Therapy

## 2016-09-10 ENCOUNTER — Ambulatory Visit (HOSPITAL_COMMUNITY): Payer: Medicare Other | Admitting: Physical Therapy

## 2016-09-10 DIAGNOSIS — R262 Difficulty in walking, not elsewhere classified: Secondary | ICD-10-CM

## 2016-09-10 DIAGNOSIS — M545 Low back pain: Secondary | ICD-10-CM

## 2016-09-10 DIAGNOSIS — M6281 Muscle weakness (generalized): Secondary | ICD-10-CM | POA: Diagnosis not present

## 2016-09-10 DIAGNOSIS — R2681 Unsteadiness on feet: Secondary | ICD-10-CM | POA: Diagnosis not present

## 2016-09-10 DIAGNOSIS — R29898 Other symptoms and signs involving the musculoskeletal system: Secondary | ICD-10-CM

## 2016-09-10 NOTE — Therapy (Signed)
[place], [place], [address] Phone: [phone]   Fax:  [phone]  Physical Therapy Treatment (Re-Assessment)  Patient Details  Name: Zachary [name] MRN: [id number] Date of Birth: Jul 08, 1946 Referring Provider: Veleta Miners  Encounter Date: 09/10/2016      PT End of Session - 09/10/16 1139    Visit Number 7   Number of Visits 13   Date for PT Re-Evaluation 10/01/16   Authorization Type Medicare/[employer]  (G-code done 7th session)   Authorization Time Period 08/12/16 to 10/12/16   Authorization - Visit Number 7   Authorization - Number of Visits 17   PT Start Time 0902   PT Stop Time 0943   PT Time Calculation (min) 41 min   Activity Tolerance Patient tolerated treatment well   Behavior During Therapy [facility] for tasks assessed/performed      Past Medical History:  Diagnosis Date  . Anxiety    takes Valium as needed  . Arthritis   . Bladder cancer ([place])    takes Rapaflo daily  . Blood dyscrasia    07/08/16: pt told he was a "free bleeder" after bleeding a lot when derm cut off mole on forehead. No excessive bleeding with minor wounds at home, no bleeding problems perioperatively with prior surgeries.  . Chronic back pain    spondylolisthesis  . Cluster headaches   . Depression    takes Lexapro daily  . Essential hypertension, benign    takes Diovan-HCT daily  . Joint pain   . Obstructive sleep apnea on CPAP    uses CPAP @ night  . Pneumonia    "several times"--last time about 3-4 yrs ago  . PONV (postoperative nausea and vomiting)    Hx: of "sometimes"  . Prediabetes     Past Surgical History:  Procedure Laterality Date  . ANTERIOR CERVICAL DECOMP/DISCECTOMY FUSION N/A 06/05/2013   Procedure:  C3-4 Anterior Cervical Discectomy and Fusion, Allograft, Plate;  Surgeon: [name], MD;  Location: [place];  Service: Orthopedics;  Laterality: N/A;  C3-4 Anterior Cervical Discectomy and  Fusion, Allograft, Plate  . Arthroscopic knee surgery    . BACK SURGERY      x 2  . BLADDER SURGERY     x 5 to remove tumor  . CARPAL TUNNEL RELEASE    . CATARACT EXTRACTION W/PHACO  12/19/2012   Procedure: CATARACT EXTRACTION PHACO AND INTRAOCULAR LENS PLACEMENT (IOC);  Surgeon: [name], MD;  Location: [facility];  Service: Ophthalmology;  Laterality: Left;  CDE: 26.80  . CATARACT EXTRACTION W/PHACO  01/09/2013   Procedure: CATARACT EXTRACTION PHACO AND INTRAOCULAR LENS PLACEMENT (IOC);  Surgeon: [name], MD;  Location: [facility];  Service: Ophthalmology;  Laterality: Right;  CDE:22.83  . CERVICAL FUSION  06/05/2013   C 3  C4  . CYSTOSCOPY    . REFRACTIVE SURGERY     Hx: of  . RETINAL DETACHMENT SURGERY    . TRANSURETHRAL RESECTION OF BLADDER TUMOR Right 07/27/2015   Procedure: TRANSURETHRAL RESECTION OF BLADDER TUMOR (TURBT);  Surgeon: [name], MD;  Location: [facility];  Service: Urology;  Laterality: Right;    There were no vitals filed for this visit.      Subjective Assessment - 09/10/16 0904    Subjective Patient arrives using his cane today, reports he is feeling better. However about midday he does get sort of tired and needs a nap. He is feeling stronger and feels that the  hardest thing for him to do right now is balance related tasks. No falls or close calls recently. He rates himself as being a 85-90% on a subjective scale, with the last 10-15% being balance related. Sometimes he will forget cane and has been able to get around without any problems.    Pertinent History HTN, OSA, hypotension, history of cervical HNP, anxiety, pre-diabetic, history of cervical and lumbar surgeries    How long can you sit comfortably? 9/14- unlimited    How long can you stand comfortably? 9/14- feels weak but fairly unlimited    How long can you walk comfortably? 9/14- has done 1/8 of a mile    Patient Stated Goals get back to PLOF, improve balance and walking without SPC   Currently in  Pain? No/denies            [facility] PT Assessment - 09/10/16 0001      Observation/Other Assessments   Focus on Therapeutic Outcomes (FOTO)  31% limited (59% limited)      Strength   Right Hip Flexion 4+/5   Right Hip ABduction 4/5   Left Hip Flexion 4+/5   Left Hip ABduction 4/5   Right Knee Flexion 4+/5   Right Knee Extension 5/5   Left Knee Flexion 4+/5   Left Knee Extension 4+/5   Right Ankle Dorsiflexion 5/5   Left Ankle Dorsiflexion 5/5     6 minute walk test results    Aerobic Endurance Distance Walked 506   Endurance additional comments 3MWT, no device      High Level Balance   High Level Balance Comments TUG 11 seconds no device; SLS 1-6 seconds B                           Balance Exercises - 09/10/16 0941      Balance Exercises: Standing   Tandem Stance Eyes open;3 reps           PT Education - 09/10/16 1131    Education provided Yes   Education Details progress with skilled PT services, POC moving forward    Person(s) Educated Patient   Methods Explanation   Comprehension Verbalized understanding          PT Short Term Goals - 09/10/16 0929      PT SHORT TERM GOAL #1   Title Patient will be able to ambulate at least 654f with SPC during 3MWT in order to demonstrate improved mobility and safety with community ambulation    Baseline 9/14- 5081fwithout a device    Time 4   Period Weeks   Status On-going     PT SHORT TERM GOAL #2   Title Patient to be able to perform TUG test in 10 seconds or less with SPC to show improved mobility and reduced fall risk    Baseline 9/14- 11 seconds with no device    Time 4   Period Weeks   Status On-going     PT SHORT TERM GOAL #3   Title Patient to be participatory in regular walking program, at least 5 days per week and at least 20 minutes in duration in order to build functional activity tolerance and improve mobility    Baseline 9/14- going well, he is walking 1-2 laps on track     Time 4   Period Weeks   Status On-going     PT SHORT TERM GOAL #4   Title Patient to be independent  in correctly and consistently performing appropriate HEP, to be updated PRN    Baseline 2023/09/26- balance exercises are tough; reports that he is doing them maybe 2-3 times per week    Time 4   Period Weeks   Status Partially Met           PT Long Term Goals - 09-25-2016 0932      PT LONG TERM GOAL #1   Title Patient to demonstrate strength 5/5 in all tested muscle groups in order to facilitate improved mobility within community    Time 8   Period Weeks   Status Partially Met     PT LONG TERM GOAL #2   Title Patient to be able to maintain SLS for 30 seconds each LE in order to show improved balance and reduced fall risk    Baseline 26-Sep-2023- 6 seconds best    Time 8   Period Weeks   Status On-going     PT LONG TERM GOAL #3   Title Patient to be able to ambulate unlimited distances with no assistive device and minimal fatigue or unsteadiness in order to faciltiate safe return to community based activities    Baseline 09-26-23- using cane but dependence on it is decreasing    Time 8   Period Weeks   Status On-going     PT LONG TERM GOAL #4   Title Patient to be participatory in regular aerobic exercise program, at least 3 days per week, at least 20-30 minutes in duration, in order to maintain functional gains and to improve overall health status    Time 8   Period Weeks   Status On-going               Plan - 09-25-16 1140    Clinical Impression Statement Re-assessment performed today. Patient arrives reporting he is definitely feeling better and has noticed he is able to walk around/mobilize without his cane more often. Upon examination, patient reveals good objective improvements on all fronts, with balance being the primary outlying factor of concern objectively and subjectively. Continued to encourage patient to remain as active as possible with increased compliance to HEP and  regular walking/exercise program. Recommend extension of skilled PT services to focus on balance and gait before DC to home exercise regimen.    Rehab Potential Good   PT Frequency 2x / week   PT Duration 3 weeks   PT Treatment/Interventions ADLs/Self Care Home Management;Cryotherapy;Moist Heat;DME Instruction;Gait training;Stair training;Functional mobility training;Therapeutic activities;Therapeutic exercise;Balance training;Neuromuscular re-education;Patient/family education;Manual techniques;Passive range of motion;Energy conservation;Taping   PT Next Visit Plan  continue focus on balance, gait, functional activity tolerance. NO BENDING, LIFTING, TWISTING (BLT PRECAUTIONS). Continue to encourage patient to contaact MD to see if he can come out of back brace yet.    PT Home Exercise Plan Consider addition of tandem stance, supine neck retraction, and supine T-spine towel roll stretch to HEP.    Consulted and Agree with Plan of Care Patient      Patient will benefit from skilled therapeutic intervention in order to improve the following deficits and impairments:  Abnormal gait, Improper body mechanics, Pain, Decreased coordination, Decreased mobility, Postural dysfunction, Decreased activity tolerance, Decreased strength, Decreased balance, Difficulty walking, Impaired flexibility  Visit Diagnosis: Midline low back pain, with sciatica presence unspecified  Muscle weakness (generalized)  Unsteadiness on feet  Difficulty in walking, not elsewhere classified  Other symptoms and signs involving the musculoskeletal system       G-Codes - Sep 25, 2016 1144  Functional Assessment Tool Used Based on skilled clinical assessment of gait, strength, balance, functional activity tolerance    Functional Limitation Mobility: Walking and moving around   Mobility: Walking and Moving Around Current Status [phone]) At least 20 percent but less than 40 percent impaired, limited or restricted   Mobility:  Walking and Moving Around Goal Status [phone]) At least 1 percent but less than 20 percent impaired, limited or restricted      Problem List Patient Active Problem List   Diagnosis Date Noted  . Postural hypotension 07/23/2016  . Dysphagia 07/23/2016  . Obstructive sleep apnea 07/21/2016  . Hyperglycemia 07/21/2016  . Anemia, unspecified 07/21/2016  . Spinal stenosis of lumbar region 07/15/2016  . Bladder tumor 07/27/2015  . HNP (herniated nucleus pulposus), cervical 06/05/2013    Class: Diagnosis of  . 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  . Muscle spasms of neck 04/04/2013  . 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 OA (osteoarthritis) of knee 02/22/2013  . Iliotibial band syndrome of left side 11/22/2012  . Localized, primary osteoarthritis of the ankle and foot 10/11/2012  . Difficulty in walking(719.7) 08/17/2012  . Peroneal tendinitis 08/16/2012  . Precordial pain 02/09/2012  . Essential hypertension, benign 02/09/2012  . Mixed hyperlipidemia 02/09/2012  . Dysthymia 02/04/2012  . Abnormality of gait 12/23/2011  . Lateral epicondylitis/tennis elbow 05/06/2011  . TENOSYNOVITIS OF FOOT AND ANKLE 10/22/2010    Deniece Ree PT, DPT [place] [address] [place], [place], [address] Phone: [phone]   Fax:  [phone]  Name: Zachary [name] MRN: [id number] Date of Birth: [birth year]

## 2016-09-14 ENCOUNTER — Telehealth (HOSPITAL_COMMUNITY): Payer: Self-pay

## 2016-09-14 NOTE — Telephone Encounter (Signed)
09/14/16 cx - said he had another appt at the same time as therapy and won't be here.  I offered two other appts later in the day but he didn't want either of those.

## 2016-09-15 ENCOUNTER — Encounter (HOSPITAL_COMMUNITY): Payer: Self-pay

## 2016-09-16 ENCOUNTER — Encounter (HOSPITAL_COMMUNITY): Payer: Self-pay | Admitting: Psychiatry

## 2016-09-16 ENCOUNTER — Ambulatory Visit (INDEPENDENT_AMBULATORY_CARE_PROVIDER_SITE_OTHER): Payer: Medicare Other | Admitting: Psychiatry

## 2016-09-16 VITALS — BP 135/90 | HR 71 | Ht 72.0 in | Wt 224.8 lb

## 2016-09-16 DIAGNOSIS — F331 Major depressive disorder, recurrent, moderate: Secondary | ICD-10-CM | POA: Diagnosis not present

## 2016-09-16 MED ORDER — DIAZEPAM 5 MG PO TABS: 5.0000 mg | ORAL_TABLET | Freq: Two times a day (BID) | ORAL | 2 refills | Status: DC | PRN

## 2016-09-16 MED ORDER — DULOXETINE HCL 60 MG PO CPEP: 60.0000 mg | ORAL_CAPSULE | Freq: Every day | ORAL | 2 refills | Status: DC

## 2016-09-16 NOTE — Progress Notes (Signed)
Patient ID: GRACE AMBROGIO, male   DOB: 1946-09-07, 70 y.o.   MRN: [id number] Patient ID: EMERSEN BARCENA, male   DOB: 10/11/1946, 70 y.o.   MRN: [id number] Patient ID: JOVONN SIBAJA, male   DOB: 11/16/1946, 70 y.o.   MRN: [id number] Patient ID: DONTAI TILLEY, male   DOB: 19-Sep-1946, 70 y.o.   MRN: [id number] Patient ID: CODYLEE BAJAJ, male   DOB: 1946-12-24, 70 y.o.   MRN: [id number] Patient ID: REEDY DOELL, male   DOB: 02/24/1946, 70 y.o.   MRN: [id number] Patient ID: DAYYAN SAHIN, male   DOB: Sep 01, 1946, 70 y.o.   MRN: [id number] Patient ID: AMBROSIO STALLCUP, male   DOB: 03-17-1946, 70 y.o.   MRN: [id number] Patient ID: TECUMSEH JAVADI, male   DOB: 09/13/1946, 70 y.o.   MRN: [id number] Patient ID: CLARE BREYER, male   DOB: Jul 09, 1946, 70 y.o.   MRN: [id number] Patient ID: MUATH SPROUL, male   DOB: 09-Jul-1946, 70 y.o.   MRN: [id number] [facility] [id number] Progress Note CAEDYN MATHENIA MRN: [id number] DOB: 10-10-1946 Age: 70 y.o.  Date: 09/16/2016 Start Time: 3:10 PM End Time: 3:33 PM  Chief Complaint: Chief Complaint  Patient presents with  . Depression  . Anxiety  . Follow-up   Subjective: "My wife left."  This patient is a 70 year-old married white male who lives with his wife in [place] they have no children. 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  The patient states he got depressed several years ago after his diagnosed with stage IV bladder cancer. He was seeing a psychologist here to deal with this and last year was placed on Lexapro. His cancer is gone now after 5 surgeries. He recently had back surgery as well but is recovered from this with no pain. He states that his mood is been great. He's excited about the football season. He is sleeping well and his energy is good  The patient returns after 3 months. He had a lumbar laminectomy in July  and then spent 6 weeks in a nursing home for rehabilitation. During that time his wife was fairly attentive to him but as soon as he healed up some she has turned her back on him by his report. They were going to a marriage counselor but she stopped this and stated she wanted no more contact with him. They're both going to individual counselors at a [employer]. 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 He is sad and despondent but claims he would never try to harm himself. He doesn't think the increase Lexapro is helped that much. Since he still depressed and in pain I suggested we switch to Cymbalta. The Valium is helping him relax. I also urged him to take care of himself and his health primarily right now and not worry about what his wife's decision will be.    Vitals: BP 135/90 (BP Location: Right Arm, Patient Position: Sitting, Cuff Size: Large)   Pulse 71   Ht 6' (1.829 m)   Wt 224 lb 12.8 oz (102 kg)   SpO2 91%   BMI 30.49 kg/m   Allergies: Allergies  Allergen Reactions  . Sulfonamide Derivatives Itching, Rash and Other (See  Comments)    whelps   Medical History: Past Medical History:  Diagnosis Date  . Anxiety    takes Valium as needed  . Arthritis   . Bladder cancer ([place])    takes Rapaflo daily  . Blood dyscrasia    07/08/16: pt told he was a "free bleeder" after bleeding a lot when derm cut off mole on forehead. No excessive bleeding with minor wounds at home, no bleeding problems perioperatively with prior surgeries.  . Chronic back pain    spondylolisthesis  . Cluster headaches   . Depression    takes Lexapro daily  . Essential hypertension, benign    takes Diovan-HCT daily  . Joint pain   . Obstructive sleep apnea on CPAP    uses CPAP @ night  . Pneumonia    "several times"--last time about 3-4 yrs ago  . PONV (postoperative nausea and  vomiting)    Hx: of "sometimes"  . Prediabetes   Patient has history of back pain, hypertension, tinnitus, Obstructive sleep apnea, knee surgery and back surgery. Patient also has bladder cancer and carpal tunnel release.  His primary care physician is Dr. [name] and he see Dr. Aline Brochure for his pain management.  Surgical History: Past Surgical History:  Procedure Laterality Date  . ANTERIOR CERVICAL DECOMP/DISCECTOMY FUSION N/A 06/05/2013   Procedure:  C3-4 Anterior Cervical Discectomy and Fusion, Allograft, Plate;  Surgeon: [name], MD;  Location: [facility];  Service: Orthopedics;  Laterality: N/A;  C3-4 Anterior Cervical Discectomy and Fusion, Allograft, Plate  . Arthroscopic knee surgery    . BACK SURGERY      x 2  . BLADDER SURGERY     x 5 to remove tumor  . CARPAL TUNNEL RELEASE    . CATARACT EXTRACTION W/PHACO  12/19/2012   Procedure: CATARACT EXTRACTION PHACO AND INTRAOCULAR LENS PLACEMENT (IOC);  Surgeon: [name], MD;  Location: [facility];  Service: Ophthalmology;  Laterality: Left;  CDE: 26.80  . CATARACT EXTRACTION W/PHACO  01/09/2013   Procedure: CATARACT EXTRACTION PHACO AND INTRAOCULAR LENS PLACEMENT (IOC);  Surgeon: [name], MD;  Location: [facility];  Service: Ophthalmology;  Laterality: Right;  CDE:22.83  . CERVICAL FUSION  06/05/2013   C 3  C4  . CYSTOSCOPY    . REFRACTIVE SURGERY     Hx: of  . RETINAL DETACHMENT SURGERY    . TRANSURETHRAL RESECTION OF BLADDER TUMOR Right 07/27/2015   Procedure: TRANSURETHRAL RESECTION OF BLADDER TUMOR (TURBT);  Surgeon: [name], MD;  Location: [facility];  Service: Urology;  Laterality: Right;   Family History: family history includes Alcohol abuse in his maternal grandfather and mother; Anxiety disorder in his sister; Cancer in his father; Depression in his mother; Hypertension in his father, mother, and sister. Reviewed and nothing is new except the neck surgery that is already on the surgical history.  Mental status  examination Patient is casually dressed fairly groomed. He is pleasant and cooperative.  He maintained fair eye contact. His speech is clear coherent and fluent. His thought process is logical linear and goal-directed. He denies any auditory or visual hallucination. He denies any active or passive suicidal thinking and homicidal thinking. He described his mood Is depressed and desponded and more hopeless and I've ever seen him before. His affect is constricted. His attention and concentration is good. He's alert and oriented x3. His insight judgment and impulse control is okay.  Lab Results:  Results for orders placed or performed during the hospital encounter of  08/05/16 (from the past 8736 hour(s))  CBC   Collection Time: 08/05/16  7:30 AM  Result Value Ref Range   WBC 8.1 4.0 - 10.5 K/uL   RBC 3.47 (L) 4.22 - 5.81 MIL/uL   Hemoglobin 10.6 (L) 13.0 - 17.0 g/dL   HCT 32.1 (L) 39.0 - 52.0 %   MCV 92.5 78.0 - 100.0 fL   MCH 30.5 26.0 - 34.0 pg   MCHC 33.0 30.0 - 36.0 g/dL   RDW 14.2 11.5 - 15.5 %   Platelets 228 150 - 400 K/uL  Basic metabolic panel   Collection Time: 08/05/16  7:30 AM  Result Value Ref Range   Sodium 139 135 - 145 mmol/L   Potassium 4.1 3.5 - 5.1 mmol/L   Chloride 103 101 - 111 mmol/L   CO2 27 22 - 32 mmol/L   Glucose, Bld 96 65 - 99 mg/dL   BUN 14 6 - 20 mg/dL   Creatinine, Ser 0.98 0.61 - 1.24 mg/dL   Calcium 8.5 (L) 8.9 - 10.3 mg/dL   GFR calc non Af Amer >60 >60 mL/min   GFR calc Af Amer >60 >60 mL/min   Anion gap 9 5 - 15  Results for orders placed or performed during the hospital encounter of 07/24/16 (from the past 8736 hour(s))  Basic metabolic panel   Collection Time: 07/24/16  4:11 PM  Result Value Ref Range   Sodium 134 (L) 135 - 145 mmol/L   Potassium 4.2 3.5 - 5.1 mmol/L   Chloride 102 101 - 111 mmol/L   CO2 25 22 - 32 mmol/L   Glucose, Bld 88 65 - 99 mg/dL   BUN 33 (H) 6 - 20 mg/dL   Creatinine, Ser 1.15 0.61 - 1.24 mg/dL   Calcium 8.3 (L) 8.9  - 10.3 mg/dL   GFR calc non Af Amer >60 >60 mL/min   GFR calc Af Amer >60 >60 mL/min   Anion gap 7 5 - 15  CBC with Differential   Collection Time: 07/24/16  4:11 PM  Result Value Ref Range   WBC 16.7 (H) 4.0 - 10.5 K/uL   RBC 3.71 (L) 4.22 - 5.81 MIL/uL   Hemoglobin 11.4 (L) 13.0 - 17.0 g/dL   HCT 34.0 (L) 39.0 - 52.0 %   MCV 91.6 78.0 - 100.0 fL   MCH 30.7 26.0 - 34.0 pg   MCHC 33.5 30.0 - 36.0 g/dL   RDW 14.8 11.5 - 15.5 %   Platelets 229 150 - 400 K/uL   Neutrophils Relative % 78 %   Neutro Abs 13.2 (H) 1.7 - 7.7 K/uL   Lymphocytes Relative 13 %   Lymphs Abs 2.2 0.7 - 4.0 K/uL   Monocytes Relative 6 %   Monocytes Absolute 0.9 0.1 - 1.0 K/uL   Eosinophils Relative 3 %   Eosinophils Absolute 0.5 0.0 - 0.7 K/uL   Basophils Relative 0 %   Basophils Absolute 0.0 0.0 - 0.1 K/uL  Urinalysis, Routine w reflex microscopic (not at [facility])   Collection Time: 07/24/16  4:15 PM  Result Value Ref Range   Color, Urine YELLOW YELLOW   APPearance CLEAR CLEAR   Specific Gravity, Urine 1.020 1.005 - 1.030   pH 5.5 5.0 - 8.0   Glucose, UA NEGATIVE NEGATIVE mg/dL   Hgb urine dipstick NEGATIVE NEGATIVE   Bilirubin Urine NEGATIVE NEGATIVE   Ketones, ur NEGATIVE NEGATIVE mg/dL   Protein, ur NEGATIVE NEGATIVE mg/dL   Nitrite NEGATIVE NEGATIVE   Leukocytes,  UA NEGATIVE NEGATIVE  I-Stat CG4 Lactic Acid, ED   Collection Time: 07/24/16  4:41 PM  Result Value Ref Range   Lactic Acid, Venous 1.97 (HH) 0.5 - 1.9 mmol/L  I-Stat CG4 Lactic Acid, ED   Collection Time: 07/24/16  7:10 PM  Result Value Ref Range   Lactic Acid, Venous 1.54 0.5 - 1.9 mmol/L  Results for orders placed or performed during the hospital encounter of 07/22/16 (from the past 8736 hour(s))  CBC   Collection Time: 07/22/16  7:00 AM  Result Value Ref Range   WBC 14.2 (H) 4.0 - 10.5 K/uL   RBC 3.70 (L) 4.22 - 5.81 MIL/uL   Hemoglobin 11.3 (L) 13.0 - 17.0 g/dL   HCT 33.7 (L) 39.0 - 52.0 %   MCV 91.1 78.0 - 100.0 fL   MCH  30.5 26.0 - 34.0 pg   MCHC 33.5 30.0 - 36.0 g/dL   RDW 14.4 11.5 - 15.5 %   Platelets 197 150 - 400 K/uL  Basic metabolic panel   Collection Time: 07/22/16  7:00 AM  Result Value Ref Range   Sodium 136 135 - 145 mmol/L   Potassium 3.6 3.5 - 5.1 mmol/L   Chloride 103 101 - 111 mmol/L   CO2 28 22 - 32 mmol/L   Glucose, Bld 90 65 - 99 mg/dL   BUN 28 (H) 6 - 20 mg/dL   Creatinine, Ser 0.89 0.61 - 1.24 mg/dL   Calcium 7.8 (L) 8.9 - 10.3 mg/dL   GFR calc non Af Amer >60 >60 mL/min   GFR calc Af Amer >60 >60 mL/min   Anion gap 5 5 - 15  Hemoglobin A1c   Collection Time: 07/22/16  7:00 AM  Result Value Ref Range   Hgb A1c MFr Bld 5.8 (H) 4.8 - 5.6 %   Mean Plasma Glucose 120 mg/dL  Results for orders placed or performed during the hospital encounter of 07/15/16 (from the past 8736 hour(s))  I-STAT 7, (LYTES, BLD GAS, ICA, H+H)   Collection Time: 07/15/16 12:39 PM  Result Value Ref Range   pH, Arterial 7.281 (L) 7.350 - 7.450   pCO2 arterial 54.7 (H) 35.0 - 45.0 mmHg   pO2, Arterial 124.0 (H) 80.0 - 100.0 mmHg   Bicarbonate 26.3 (H) 20.0 - 24.0 mEq/L   TCO2 28 0 - 100 mmol/L   O2 Saturation 98.0 %   Acid-base deficit 2.0 0.0 - 2.0 mmol/L   Sodium 141 135 - 145 mmol/L   Potassium 3.4 (L) 3.5 - 5.1 mmol/L   Calcium, Ion 1.21 1.12 - 1.23 mmol/L   HCT 35.0 (L) 39.0 - 52.0 %   Hemoglobin 11.9 (L) 13.0 - 17.0 g/dL   Patient temperature 35.1 C    Sample type ARTERIAL   Troponin I   Collection Time: 07/15/16  3:27 PM  Result Value Ref Range   Troponin I <0.03 <0.03 ng/mL  Basic metabolic panel   Collection Time: 07/15/16  3:27 PM  Result Value Ref Range   Sodium 137 135 - 145 mmol/L   Potassium 3.1 (L) 3.5 - 5.1 mmol/L   Chloride 105 101 - 111 mmol/L   CO2 27 22 - 32 mmol/L   Glucose, Bld 177 (H) 65 - 99 mg/dL   BUN 9 6 - 20 mg/dL   Creatinine, Ser 1.15 0.61 - 1.24 mg/dL   Calcium 7.8 (L) 8.9 - 10.3 mg/dL   GFR calc non Af Amer >60 >60 mL/min   GFR calc Af Amer >  60 >60 mL/min    Anion gap 5 5 - 15  Magnesium   Collection Time: 07/15/16  3:27 PM  Result Value Ref Range   Magnesium 1.3 (L) 1.7 - 2.4 mg/dL  CBC with Differential/Platelet   Collection Time: 07/15/16  3:27 PM  Result Value Ref Range   WBC 20.6 (H) 4.0 - 10.5 K/uL   RBC 3.82 (L) 4.22 - 5.81 MIL/uL   Hemoglobin 11.1 (L) 13.0 - 17.0 g/dL   HCT 34.1 (L) 39.0 - 52.0 %   MCV 89.3 78.0 - 100.0 fL   MCH 29.1 26.0 - 34.0 pg   MCHC 32.6 30.0 - 36.0 g/dL   RDW 13.4 11.5 - 15.5 %   Platelets 164 150 - 400 K/uL   Neutrophils Relative % 95 %   Neutro Abs 19.5 (H) 1.7 - 7.7 K/uL   Lymphocytes Relative 2 %   Lymphs Abs 0.4 (L) 0.7 - 4.0 K/uL   Monocytes Relative 3 %   Monocytes Absolute 0.6 0.1 - 1.0 K/uL   Eosinophils Relative 0 %   Eosinophils Absolute 0.0 0.0 - 0.7 K/uL   Basophils Relative 0 %   Basophils Absolute 0.0 0.0 - 0.1 K/uL  Phosphorus   Collection Time: 07/15/16  3:27 PM  Result Value Ref Range   Phosphorus 3.1 2.5 - 4.6 mg/dL  Calcium, ionized   Collection Time: 07/15/16  3:27 PM  Result Value Ref Range   Calcium, Ionized, Serum 4.7 4.5 - 5.6 mg/dL  ECHOCARDIOGRAM COMPLETE   Collection Time: 07/15/16  3:50 PM  Result Value Ref Range   Weight 3,584 oz   BP 163/86 mmHg   LV PW d 14.6 (A) 0.6 - 1.1 mm   FS 39 28 - 44 %   LA ID, A-P, ES 33 mm   IVS/LV PW RATIO, ED 1.12    Stroke v 41 ml   LVOT VTI 19.1 cm   Reg peak vel 189 cm/s   RV sys press 17 mmHg   LV e' LATERAL 5.33 cm/s   LV E/e' medial 12.48    LV E/e'average 12.48    LA diam index 1.47 cm/m2   LA vol A4C 47.5 ml   E decel time 458 msec   LVOT diameter 21 mm   LVOT area 3.46 cm2   LVOT peak vel 87.2 cm/s   LVOT SV 66 mL   E/e' ratio 12.48    MV pk E vel 66.5 m/s   TR max vel 189 cm/s   MV pk A vel 89.7 m/s   LV sys vol 24 21 - 61 mL   LV sys vol index 11 mL/m2   LV dias vol 65 62 - 150 mL   LV dias vol index 29 mL/m2   MV Dec 458    LA diam end sys 33 mm   Simpson's disk 63    TDI e' medial 5    TDI e'  lateral 5.33    TAPSE 18.2 mm  Blood gas, arterial   Collection Time: 07/15/16  4:15 PM  Result Value Ref Range   FIO2 0.70    Delivery systems VENTILATOR    Mode PRESSURE REGULATED VOLUME CONTROL    VT 620 mL   LHR 14 resp/min   Peep/cpap 5.0 cm H20   pH, Arterial 7.415 7.350 - 7.450   pCO2 arterial 40.5 35.0 - 45.0 mmHg   pO2, Arterial 105 (H) 80.0 - 100.0 mmHg   Bicarbonate 26.0 (H) 20.0 -  24.0 mEq/L   TCO2 27.4 0 - 100 mmol/L   Acid-Base Excess 1.6 0.0 - 2.0 mmol/L   O2 Saturation 98.1 %   Patient temperature 95.5    Collection site A-LINE    Drawn by [id number]    Sample type ARTERIAL DRAW    Allens test (pass/fail) PASS PASS  Troponin I   Collection Time: 07/15/16  9:55 PM  Result Value Ref Range   Troponin I <0.03 <0.03 ng/mL  Blood gas, arterial   Collection Time: 07/16/16  3:42 AM  Result Value Ref Range   FIO2 0.50    Delivery systems VENTILATOR    Mode PRESSURE REGULATED VOLUME CONTROL    VT 620 mL   LHR 14 resp/min   Peep/cpap 5.0 cm H20   pH, Arterial 7.420 7.350 - 7.450   pCO2 arterial 41.4 35.0 - 45.0 mmHg   pO2, Arterial 95.8 80.0 - 100.0 mmHg   Bicarbonate 26.3 (H) 20.0 - 24.0 mEq/L   TCO2 27.6 0 - 100 mmol/L   Acid-Base Excess 2.2 (H) 0.0 - 2.0 mmol/L   O2 Saturation 97.6 %   Patient temperature 98.6    Collection site A-LINE    Drawn by [phone]    Sample type ARTERIAL DRAW    Allens test (pass/fail) PASS PASS  Troponin I   Collection Time: 07/16/16  4:00 AM  Result Value Ref Range   Troponin I <0.03 <0.03 ng/mL  CBC with Differential/Platelet   Collection Time: 07/16/16  4:00 AM  Result Value Ref Range   WBC 17.2 (H) 4.0 - 10.5 K/uL   RBC 3.70 (L) 4.22 - 5.81 MIL/uL   Hemoglobin 10.8 (L) 13.0 - 17.0 g/dL   HCT 32.7 (L) 39.0 - 52.0 %   MCV 88.4 78.0 - 100.0 fL   MCH 29.2 26.0 - 34.0 pg   MCHC 33.0 30.0 - 36.0 g/dL   RDW 13.5 11.5 - 15.5 %   Platelets 153 150 - 400 K/uL   Neutrophils Relative % 93 %   Neutro Abs 16.0 (H) 1.7 - 7.7 K/uL    Lymphocytes Relative 5 %   Lymphs Abs 0.8 0.7 - 4.0 K/uL   Monocytes Relative 2 %   Monocytes Absolute 0.3 0.1 - 1.0 K/uL   Eosinophils Relative 0 %   Eosinophils Absolute 0.0 0.0 - 0.7 K/uL   Basophils Relative 0 %   Basophils Absolute 0.0 0.0 - 0.1 K/uL  Magnesium   Collection Time: 07/16/16  4:00 AM  Result Value Ref Range   Magnesium 1.4 (L) 1.7 - 2.4 mg/dL  Renal function panel   Collection Time: 07/16/16  4:00 AM  Result Value Ref Range   Sodium 138 135 - 145 mmol/L   Potassium 4.1 3.5 - 5.1 mmol/L   Chloride 106 101 - 111 mmol/L   CO2 26 22 - 32 mmol/L   Glucose, Bld 138 (H) 65 - 99 mg/dL   BUN 8 6 - 20 mg/dL   Creatinine, Ser 1.03 0.61 - 1.24 mg/dL   Calcium 8.1 (L) 8.9 - 10.3 mg/dL   Phosphorus 2.6 2.5 - 4.6 mg/dL   Albumin 3.0 (L) 3.5 - 5.0 g/dL   GFR calc non Af Amer >60 >60 mL/min   GFR calc Af Amer >60 >60 mL/min   Anion gap 6 5 - 15  Basic metabolic panel   Collection Time: 07/17/16  6:05 AM  Result Value Ref Range   Sodium 141 135 - 145 mmol/L   Potassium 4.2 3.5 -  5.1 mmol/L   Chloride 107 101 - 111 mmol/L   CO2 28 22 - 32 mmol/L   Glucose, Bld 150 (H) 65 - 99 mg/dL   BUN 15 6 - 20 mg/dL   Creatinine, Ser 1.08 0.61 - 1.24 mg/dL   Calcium 8.1 (L) 8.9 - 10.3 mg/dL   GFR calc non Af Amer >60 >60 mL/min   GFR calc Af Amer >60 >60 mL/min   Anion gap 6 5 - 15  CBC   Collection Time: 07/17/16  6:05 AM  Result Value Ref Range   WBC 18.2 (H) 4.0 - 10.5 K/uL   RBC 3.46 (L) 4.22 - 5.81 MIL/uL   Hemoglobin 10.1 (L) 13.0 - 17.0 g/dL   HCT 30.6 (L) 39.0 - 52.0 %   MCV 88.4 78.0 - 100.0 fL   MCH 29.2 26.0 - 34.0 pg   MCHC 33.0 30.0 - 36.0 g/dL   RDW 13.9 11.5 - 15.5 %   Platelets 179 150 - 400 K/uL  I-STAT 4, (NA,K, GLUC, HGB,HCT)   Collection Time: 07/17/16  6:02 PM  Result Value Ref Range   Sodium 144 135 - 145 mmol/L   Potassium 4.1 3.5 - 5.1 mmol/L   Glucose, Bld 138 (H) 65 - 99 mg/dL   HCT 27.0 (L) 39.0 - 52.0 %   Hemoglobin 9.2 (L) 13.0 - 17.0 g/dL   Results for orders placed or performed during the hospital encounter of 07/07/16 (from the past 8736 hour(s))  Surgical pcr screen   Collection Time: 07/07/16 11:30 AM  Result Value Ref Range   MRSA, PCR NEGATIVE NEGATIVE   Staphylococcus aureus NEGATIVE NEGATIVE  Basic metabolic panel   Collection Time: 07/07/16 11:30 AM  Result Value Ref Range   Sodium 141 135 - 145 mmol/L   Potassium 3.6 3.5 - 5.1 mmol/L   Chloride 107 101 - 111 mmol/L   CO2 26 22 - 32 mmol/L   Glucose, Bld 108 (H) 65 - 99 mg/dL   BUN 17 6 - 20 mg/dL   Creatinine, Ser 1.13 0.61 - 1.24 mg/dL   Calcium 8.9 8.9 - 10.3 mg/dL   GFR calc non Af Amer >60 >60 mL/min   GFR calc Af Amer >60 >60 mL/min   Anion gap 8 5 - 15  CBC   Collection Time: 07/07/16 11:30 AM  Result Value Ref Range   WBC 8.6 4.0 - 10.5 K/uL   RBC 4.91 4.22 - 5.81 MIL/uL   Hemoglobin 14.9 13.0 - 17.0 g/dL   HCT 45.0 39.0 - 52.0 %   MCV 91.6 78.0 - 100.0 fL   MCH 30.3 26.0 - 34.0 pg   MCHC 33.1 30.0 - 36.0 g/dL   RDW 14.3 11.5 - 15.5 %   Platelets 202 150 - 400 K/uL  Type and screen [place]   Collection Time: 07/07/16 11:40 AM  Result Value Ref Range   ABO/RH(D) O POS    Antibody Screen NEG    Sample Expiration 07/21/2016    Extend sample reason NO TRANSFUSIONS OR PREGNANCY IN THE PAST 3 MONTHS   ABO/Rh   Collection Time: 07/07/16 11:40 AM  Result Value Ref Range   ABO/RH(D) O POS   Results for orders placed or performed during the hospital encounter of 05/17/16 (from the past 8736 hour(s))  Basic metabolic panel   Collection Time: 05/17/16 10:50 AM  Result Value Ref Range   Sodium 138 135 - 145 mmol/L   Potassium 3.9 3.5 - 5.1  mmol/L   Chloride 106 101 - 111 mmol/L   CO2 26 22 - 32 mmol/L   Glucose, Bld 100 (H) 65 - 99 mg/dL   BUN 18 6 - 20 mg/dL   Creatinine, Ser 1.16 0.61 - 1.24 mg/dL   Calcium 9.1 8.9 - 10.3 mg/dL   GFR calc non Af Amer >60 >60 mL/min   GFR calc Af Amer >60 >60 mL/min   Anion gap 6 5 -  15  CBC   Collection Time: 05/17/16 10:50 AM  Result Value Ref Range   WBC 9.8 4.0 - 10.5 K/uL   RBC 5.24 4.22 - 5.81 MIL/uL   Hemoglobin 15.4 13.0 - 17.0 g/dL   HCT 47.0 39.0 - 52.0 %   MCV 89.7 78.0 - 100.0 fL   MCH 29.4 26.0 - 34.0 pg   MCHC 32.8 30.0 - 36.0 g/dL   RDW 13.9 11.5 - 15.5 %   Platelets 206 150 - 400 K/uL  I-stat troponin, ED   Collection Time: 05/17/16 10:54 AM  Result Value Ref Range   Troponin i, poc 0.00 0.00 - 0.08 ng/mL   Comment 3          Urinalysis, Routine w reflex microscopic   Collection Time: 05/17/16 12:07 PM  Result Value Ref Range   Color, Urine YELLOW YELLOW   APPearance CLEAR CLEAR   Specific Gravity, Urine 1.020 1.005 - 1.030   pH 5.0 5.0 - 8.0   Glucose, UA NEGATIVE NEGATIVE mg/dL   Hgb urine dipstick MODERATE (A) NEGATIVE   Bilirubin Urine NEGATIVE NEGATIVE   Ketones, ur NEGATIVE NEGATIVE mg/dL   Protein, ur 30 (A) NEGATIVE mg/dL   Nitrite NEGATIVE NEGATIVE   Leukocytes, UA TRACE (A) NEGATIVE  Urine microscopic-add on   Collection Time: 05/17/16 12:07 PM  Result Value Ref Range   Squamous Epithelial / LPF 0-5 (A) NONE SEEN   WBC, UA 6-30 0 - 5 WBC/hpf   RBC / HPF 6-30 0 - 5 RBC/hpf   Bacteria, UA RARE (A) NONE SEEN   Casts HYALINE CASTS (A) NEGATIVE   Urine-Other MUCOUS PRESENT   Results for orders placed or performed during the hospital encounter of 12/26/15 (from the past 8736 hour(s))  Urinalysis, Routine w reflex microscopic (not at [facility])   Collection Time: 12/26/15  6:22 AM  Result Value Ref Range   Color, Urine RED (A) YELLOW   APPearance TURBID (A) CLEAR   Specific Gravity, Urine 1.021 1.005 - 1.030   pH 5.0 5.0 - 8.0   Glucose, UA NEGATIVE NEGATIVE mg/dL   Hgb urine dipstick LARGE (A) NEGATIVE   Bilirubin Urine SMALL (A) NEGATIVE   Ketones, ur NEGATIVE NEGATIVE mg/dL   Protein, ur 100 (A) NEGATIVE mg/dL   Nitrite POSITIVE (A) NEGATIVE   Leukocytes, UA LARGE (A) NEGATIVE  Urine microscopic-add on   Collection Time:  12/26/15  6:22 AM  Result Value Ref Range   Squamous Epithelial / LPF 6-30 (A) NONE SEEN   WBC, UA 6-30 0 - 5 WBC/hpf   RBC / HPF TOO NUMEROUS TO COUNT 0 - 5 RBC/hpf   Bacteria, UA MANY (A) NONE SEEN  Urine culture   Collection Time: 12/26/15  6:30 AM  Result Value Ref Range   Specimen Description URINE, CLEAN CATCH    Special Requests Normal    Culture      >=100,000 COLONIES/mL LECLERCIA ADECARBOXYLATA Performed at [facility]    Report Status 12/29/2015 FINAL    Organism ID, Bacteria  LECLERCIA ADECARBOXYLATA       Susceptibility   Leclercia adecarboxylata - MIC*    AMPICILLIN <=2 SENSITIVE Sensitive     CEFAZOLIN <=4 SENSITIVE Sensitive     CEFTRIAXONE <=1 SENSITIVE Sensitive     CIPROFLOXACIN <=0.25 SENSITIVE Sensitive     GENTAMICIN <=1 SENSITIVE Sensitive     IMIPENEM <=0.25 SENSITIVE Sensitive     NITROFURANTOIN <=16 SENSITIVE Sensitive     TRIMETH/SULFA <=20 SENSITIVE Sensitive     AMPICILLIN/SULBACTAM <=2 SENSITIVE Sensitive     PIP/TAZO <=4 SENSITIVE Sensitive     * >=100,000 COLONIES/mL LECLERCIA ADECARBOXYLATA  I-Stat Chem 8, ED   Collection Time: 12/26/15  8:05 AM  Result Value Ref Range   Sodium 144 135 - 145 mmol/L   Potassium 3.7 3.5 - 5.1 mmol/L   Chloride 106 101 - 111 mmol/L   BUN 24 (H) 6 - 20 mg/dL   Creatinine, Ser 1.00 0.61 - 1.24 mg/dL   Glucose, Bld 92 65 - 99 mg/dL   Calcium, Ion 1.11 (L) 1.13 - 1.30 mmol/L   TCO2 26 0 - 100 mmol/L   Hemoglobin 15.0 13.0 - 17.0 g/dL   HCT 44.0 39.0 - 52.0 %   Assessment Axis I Major depressive disorder, 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  I reviewed CC, tobacco/med/surg Hx, meds effects/ side effects, problem list, therapies and responses as well as current situation/symptoms discussed options. Patient will discontinue Lexapro and start Cymbalta 60 mg daily. He  can continue Valium 5 mg twice a day as needed and was reminded not to take it around the time of oxycodone. He'll return in 4 weeks but call sooner if his depression worsens See orders and pt instructions for more details.  MEDICATIONS this encounter: Meds ordered this encounter  Medications  . DISCONTD: escitalopram (LEXAPRO) 20 MG tablet    Sig: Take 20 mg by mouth daily.  . DULoxetine (CYMBALTA) 60 MG capsule    Sig: Take 1 capsule (60 mg total) by mouth daily.    Dispense:  30 capsule    Refill:  2  . diazepam (VALIUM) 5 MG tablet    Sig: Take 1 tablet (5 mg total) by mouth 2 (two) times daily as needed for anxiety.    Dispense:  60 tablet    Refill:  2    Medical Decision Making Problem Points:  Established problem, stable/improving (1), New problem, with no additional work-up planned (3), Review of last therapy session (1) and Review of psycho-social stressors (1) Data Points:  Review or order clinical lab tests (1) Review of medication regiment & side effects (2) Review of new medications or change in dosage (2)  I certify that outpatient services furnished can reasonably be expected to improve the patient's condition.   [name], MD

## 2016-09-17 ENCOUNTER — Ambulatory Visit (HOSPITAL_COMMUNITY): Payer: Medicare Other | Admitting: Physical Therapy

## 2016-09-17 DIAGNOSIS — R29898 Other symptoms and signs involving the musculoskeletal system: Secondary | ICD-10-CM | POA: Diagnosis not present

## 2016-09-17 DIAGNOSIS — R2681 Unsteadiness on feet: Secondary | ICD-10-CM

## 2016-09-17 DIAGNOSIS — M6281 Muscle weakness (generalized): Secondary | ICD-10-CM

## 2016-09-17 DIAGNOSIS — M545 Low back pain: Secondary | ICD-10-CM

## 2016-09-17 DIAGNOSIS — R262 Difficulty in walking, not elsewhere classified: Secondary | ICD-10-CM

## 2016-09-17 NOTE — Therapy (Signed)
[facility] [address] [place], [place], [address] Phone: [phone]   Fax:  [phone]  Physical Therapy Treatment  Patient Details  Name: Zachary [name] MRN: [id number] Date of Birth: 02/21/1946 Referring Provider: Veleta Miners  Encounter Date: 09/17/2016      PT End of Session - 09/17/16 0950    Visit Number 8   Number of Visits 13   Date for PT Re-Evaluation 10/01/16   Authorization Type Medicare/[employer]na Senior Supplement  (G-code done 7th session)   Authorization Time Period 08/12/16 to 10/12/16   Authorization - Visit Number 8   Authorization - Number of Visits 20   PT Start Time [phone]  patient arrived late    PT Stop Time 0942   PT Time Calculation (min) 32 min   Equipment Utilized During Treatment Gait belt   Activity Tolerance Patient tolerated treatment well   Behavior During Therapy [facility] for tasks assessed/performed      Past Medical History:  Diagnosis Date  . Anxiety    takes Valium as needed  . Arthritis   . Bladder cancer ([place])    takes Rapaflo daily  . Blood dyscrasia    07/08/16: pt told he was a "free bleeder" after bleeding a lot when derm cut off mole on forehead. No excessive bleeding with minor wounds at home, no bleeding problems perioperatively with prior surgeries.  . Chronic back pain    spondylolisthesis  . Cluster headaches   . Depression    takes Lexapro daily  . Essential hypertension, benign    takes Diovan-HCT daily  . Joint pain   . Obstructive sleep apnea on CPAP    uses CPAP @ night  . Pneumonia    "several times"--last time about 3-4 yrs ago  . PONV (postoperative nausea and vomiting)    Hx: of "sometimes"  . Prediabetes     Past Surgical History:  Procedure Laterality Date  . ANTERIOR CERVICAL DECOMP/DISCECTOMY FUSION N/A 06/05/2013   Procedure:  C3-4 Anterior Cervical Discectomy and Fusion, Allograft, Plate;  Surgeon: [name], MD;  Location: [facility];  Service: Orthopedics;   Laterality: N/A;  C3-4 Anterior Cervical Discectomy and Fusion, Allograft, Plate  . Arthroscopic knee surgery    . BACK SURGERY      x 2  . BLADDER SURGERY     x 5 to remove tumor  . CARPAL TUNNEL RELEASE    . CATARACT EXTRACTION W/PHACO  12/19/2012   Procedure: CATARACT EXTRACTION PHACO AND INTRAOCULAR LENS PLACEMENT (IOC);  Surgeon: [name], MD;  Location: [facility];  Service: Ophthalmology;  Laterality: Left;  CDE: 26.80  . CATARACT EXTRACTION W/PHACO  01/09/2013   Procedure: CATARACT EXTRACTION PHACO AND INTRAOCULAR LENS PLACEMENT (IOC);  Surgeon: [name], MD;  Location: [facility];  Service: Ophthalmology;  Laterality: Right;  CDE:22.83  . CERVICAL FUSION  06/05/2013   C 3  C4  . CYSTOSCOPY    . REFRACTIVE SURGERY     Hx: of  . RETINAL DETACHMENT SURGERY    . TRANSURETHRAL RESECTION OF BLADDER TUMOR Right 07/27/2015   Procedure: TRANSURETHRAL RESECTION OF BLADDER TUMOR (TURBT);  Surgeon: [name], MD;  Location: [facility];  Service: Urology;  Laterality: Right;    There were no vitals filed for this visit.      Subjective Assessment - 09/17/16 0913    Subjective The patient arrives stating that he tripped up his stairs the other day and jarred his back; he has not  had increased pain but he has been worried after this incident. No major changes otherwise.    Pertinent History HTN, OSA, hypotension, history of cervical HNP, anxiety, pre-diabetic, history of cervical and lumbar surgeries    Currently in Pain? No/denies                              Balance Exercises - 09/17/16 0914      Balance Exercises: Standing   Standing Eyes Closed Narrow base of support (BOS);Foam/compliant surface;4 reps   Tandem Stance Eyes open;4 reps   SLS Eyes open;Solid surface;4 reps   Wall Bumps --   Wall Bumps-Shoulders --   Rockerboard EO;Anterior/posterior;Lateral;Other (comment);Intermittent UE support  2 minutes each    Other Standing Exercises cone taps on  various levels with cross-midline reaches with LEs           PT Education - 09/17/16 0950    Education provided No   Person(s) Educated Patient   Methods Explanation   Comprehension Verbalized understanding          PT Short Term Goals - 09/10/16 0929      PT SHORT TERM GOAL #1   Title Patient will be able to ambulate at least 655f [facility] in order to demonstrate improved mobility and safety with community ambulation    Baseline 9/14- 5033fwithout a device    Time 4   Period Weeks   Status On-going     PT SHORT TERM GOAL #2   Title Patient to be able to perform TUG test in 10 seconds or less with SPC to show improved mobility and reduced fall risk    Baseline 9/14- 11 seconds with no device    Time 4   Period Weeks   Status On-going     PT SHORT TERM GOAL #3   Title Patient to be participatory in regular walking program, at least 5 days per week and at least 20 minutes in duration in order to build functional activity tolerance and improve mobility    Baseline 9/14- going well, he is walking 1-2 laps on track    Time 4   Period Weeks   Status On-going     PT SHORT TERM GOAL #4   Title Patient to be independent in correctly and consistently performing appropriate HEP, to be updated PRN    Baseline 9/14- balance exercises are tough; reports that he is doing them maybe 2-3 times per week    Time 4   Period Weeks   Status Partially Met           PT Long Term Goals - 09/10/16 0932      PT LONG TERM GOAL #1   Title Patient to demonstrate strength 5/5 in all tested muscle groups in order to facilitate improved mobility within community    Time 8   Period Weeks   Status Partially Met     PT LONG TERM GOAL #2   Title Patient to be able to maintain SLS for 30 seconds each LE in order to show improved balance and reduced fall risk    Baseline 9/14- 6 seconds best    Time 8   Period Weeks   Status On-going     PT LONG TERM GOAL #3   Title Patient  to be able to ambulate unlimited distances with no assistive device and minimal fatigue or unsteadiness in order to faciltiate safe return  to community based activities    Baseline 9/14- using cane but dependence on it is decreasing    Time 8   Period Weeks   Status On-going     PT LONG TERM GOAL #4   Title Patient to be participatory in regular aerobic exercise program, at least 3 days per week, at least 20-30 minutes in duration, in order to maintain functional gains and to improve overall health status    Time 8   Period Weeks   Status On-going               Plan - 09/17/16 0951    Clinical Impression Statement Patient arrived late today. Focused on balance based tasks today with min guard, noting difficulty with SLS based tasks and general unsteadiness with narrow BOS. Patient has difficulty in motor control and efficient movement on rockerboard today, did feel a bit "sea-legged" and more unsteady after getting off of lateral rockerboard exercise.    Rehab Potential Good   PT Frequency 2x / week   PT Duration 3 weeks   PT Treatment/Interventions ADLs/Self Care Home Management;Cryotherapy;Moist Heat;DME Instruction;Gait training;Stair training;Functional mobility training;Therapeutic activities;Therapeutic exercise;Balance training;Neuromuscular re-education;Patient/family education;Manual techniques;Passive range of motion;Energy conservation;Taping   PT Next Visit Plan  continue focus on balance, gait, functional activity tolerance. NO BENDING, LIFTING, TWISTING (BLT PRECAUTIONS). Continue to encourage patient to contaact MD to see if he can come out of back brace yet.    PT Home Exercise Plan Consider addition of tandem stance, supine neck retraction, and supine T-spine towel roll stretch to HEP.    Consulted and Agree with Plan of Care Patient      Patient will benefit from skilled therapeutic intervention in order to improve the following deficits and impairments:  Abnormal  gait, Improper body mechanics, Pain, Decreased coordination, Decreased mobility, Postural dysfunction, Decreased activity tolerance, Decreased strength, Decreased balance, Difficulty walking, Impaired flexibility  Visit Diagnosis: Midline low back pain, with sciatica presence unspecified  Muscle weakness (generalized)  Unsteadiness on feet  Difficulty in walking, not elsewhere classified  Other symptoms and signs involving the musculoskeletal system     Problem List Patient Active Problem List   Diagnosis Date Noted  . Postural hypotension 07/23/2016  . Dysphagia 07/23/2016  . Obstructive sleep apnea 07/21/2016  . Hyperglycemia 07/21/2016  . Anemia, unspecified 07/21/2016  . Spinal stenosis of lumbar region 07/15/2016  . Bladder tumor 07/27/2015  . HNP (herniated nucleus pulposus), cervical 06/05/2013    Class: Diagnosis of  . 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  . Muscle spasms of neck 04/04/2013  . 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 OA (osteoarthritis) of knee 02/22/2013  . Iliotibial band syndrome of left side 11/22/2012  . Localized, primary osteoarthritis of the ankle and foot 10/11/2012  . Difficulty in walking(719.7) 08/17/2012  . Peroneal tendinitis 08/16/2012  . Precordial pain 02/09/2012  . Essential hypertension, benign 02/09/2012  . Mixed hyperlipidemia 02/09/2012  . Dysthymia 02/04/2012  . Abnormality of gait 12/23/2011  . Lateral epicondylitis/tennis elbow 05/06/2011  . TENOSYNOVITIS OF FOOT AND ANKLE 10/22/2010    Deniece Ree PT, DPT [facility] [address] [place], [place], [address] Phone: [phone]   Fax:  [phone]  Name: Zachary [name] MRN: [id number] Date of Birth: [birth year]

## 2016-09-21 NOTE — Telephone Encounter (Signed)
9/25 cx the 9/28 will be going back to his surgeon

## 2016-09-22 ENCOUNTER — Ambulatory Visit (HOSPITAL_COMMUNITY): Payer: Medicare Other | Admitting: Physical Therapy

## 2016-09-22 DIAGNOSIS — M6281 Muscle weakness (generalized): Secondary | ICD-10-CM

## 2016-09-22 DIAGNOSIS — R262 Difficulty in walking, not elsewhere classified: Secondary | ICD-10-CM

## 2016-09-22 DIAGNOSIS — M545 Low back pain: Secondary | ICD-10-CM | POA: Diagnosis not present

## 2016-09-22 DIAGNOSIS — R29898 Other symptoms and signs involving the musculoskeletal system: Secondary | ICD-10-CM | POA: Diagnosis not present

## 2016-09-22 DIAGNOSIS — R2681 Unsteadiness on feet: Secondary | ICD-10-CM | POA: Diagnosis not present

## 2016-09-22 NOTE — Therapy (Signed)
[facility] [address] [place], [place], [address] Phone: [phone]   Fax:  [phone]  Physical Therapy Treatment  Patient Details  Name: Zachary [name] MRN: [id number] Date of Birth: 08/20/1946 Referring Provider: Veleta Miners  Encounter Date: 09/22/2016      PT End of Session - 09/22/16 1100    Visit Number 9   Number of Visits 13   Date for PT Re-Evaluation 10/01/16   Authorization Type Medicare/Aetna Senior Supplement  (G-code done 7th session)   Authorization Time Period 08/12/16 to 10/12/16   Authorization - Visit Number 9   Authorization - Number of Visits 17   PT Start Time 0905   PT Stop Time 0943   PT Time Calculation (min) 38 min   Equipment Utilized During Treatment Gait belt   Activity Tolerance Patient tolerated treatment well   Behavior During Therapy [facility] for tasks assessed/performed      Past Medical History:  Diagnosis Date  . Anxiety    takes Valium as needed  . Arthritis   . Bladder cancer ([place])    takes Rapaflo daily  . Blood dyscrasia    07/08/16: pt told he was a "free bleeder" after bleeding a lot when derm cut off mole on forehead. No excessive bleeding with minor wounds at home, no bleeding problems perioperatively with prior surgeries.  . Chronic back pain    spondylolisthesis  . Cluster headaches   . Depression    takes Lexapro daily  . Essential hypertension, benign    takes Diovan-HCT daily  . Joint pain   . Obstructive sleep apnea on CPAP    uses CPAP @ night  . Pneumonia    "several times"--last time about 3-4 yrs ago  . PONV (postoperative nausea and vomiting)    Hx: of "sometimes"  . Prediabetes     Past Surgical History:  Procedure Laterality Date  . ANTERIOR CERVICAL DECOMP/DISCECTOMY FUSION N/A 06/05/2013   Procedure:  C3-4 Anterior Cervical Discectomy and Fusion, Allograft, Plate;  Surgeon: [name], MD;  Location: [facility];  Service: Orthopedics;  Laterality: N/A;  C3-4  Anterior Cervical Discectomy and Fusion, Allograft, Plate  . Arthroscopic knee surgery    . BACK SURGERY      x 2  . BLADDER SURGERY     x 5 to remove tumor  . CARPAL TUNNEL RELEASE    . CATARACT EXTRACTION W/PHACO  12/19/2012   Procedure: CATARACT EXTRACTION PHACO AND INTRAOCULAR LENS PLACEMENT (IOC);  Surgeon: [name], MD;  [facility];  Service: Ophthalmology;  Laterality: Left;  CDE: 26.80  . CATARACT EXTRACTION W/PHACO  01/09/2013   Procedure: CATARACT EXTRACTION PHACO AND INTRAOCULAR LENS PLACEMENT (IOC);  Surgeon: [name], MD;  Location[facility];  Service: Ophthalmology;  Laterality: Right;  CDE:22.83  . CERVICAL FUSION  06/05/2013   C 3  C4  . CYSTOSCOPY    . REFRACTIVE SURGERY     Hx: of  . RETINAL DETACHMENT SURGERY    . TRANSURETHRAL RESECTION OF BLADDER TUMOR Right 07/27/2015   Procedure: TRANSURETHRAL RESECTION OF BLADDER TUMOR (TURBT);  Surgeon: [name], MD;  [facility];  Service: Urology;  Laterality: Right;    There were no vitals filed for this visit.      Subjective Assessment - 09/22/16 0908    Subjective Patient arrives stating no major changes since last session; he is going to his surgeon on thursday, which is why he had to cancel his  appointment here on thursday   Pertinent History HTN, OSA, hypotension, history of cervical HNP, anxiety, pre-diabetic, history of cervical and lumbar surgeries    Currently in Pain? No/denies                              Balance Exercises - 09/22/16 0909      Balance Exercises: Standing   Standing Eyes Closed Narrow base of support (BOS);Foam/compliant surface;Other (comment)  eyes closed    Tandem Stance Eyes open;3 reps   SLS Eyes open;Solid surface;4 reps   Rockerboard EO;Anterior/posterior;Lateral;Other (comment);Intermittent UE support  2 minutes    Tandem Gait Forward;4 reps   Other Standing Exercises cone taps on various levels with cross-midline reaches with LEs; ball  toss in tandem stance; ball toss with forward and retro gait            PT Education - 09/22/16 1100    Education provided No          PT Short Term Goals - 09/10/16 0929      PT SHORT TERM GOAL #1   Title Patient will be able to ambulate at least 628f [facility] in order to demonstrate improved mobility and safety with community ambulation    Baseline 9/14- 5049fwithout a device    Time 4   Period Weeks   Status On-going     PT SHORT TERM GOAL #2   Title Patient to be able to perform TUG test in 10 seconds or less with SPC to show improved mobility and reduced fall risk    Baseline 9/14- 11 seconds with no device    Time 4   Period Weeks   Status On-going     PT SHORT TERM GOAL #3   Title Patient to be participatory in regular walking program, at least 5 days per week and at least 20 minutes in duration in order to build functional activity tolerance and improve mobility    Baseline 9/14- going well, he is walking 1-2 laps on track    Time 4   Period Weeks   Status On-going     PT SHORT TERM GOAL #4   Title Patient to be independent in correctly and consistently performing appropriate HEP, to be updated PRN    Baseline 9/14- balance exercises are tough; reports that he is doing them maybe 2-3 times per week    Time 4   Period Weeks   Status Partially Met           PT Long Term Goals - 09/10/16 0932      PT LONG TERM GOAL #1   Title Patient to demonstrate strength 5/5 in all tested muscle groups in order to facilitate improved mobility within community    Time 8   Period Weeks   Status Partially Met     PT LONG TERM GOAL #2   Title Patient to be able to maintain SLS for 30 seconds each LE in order to show improved balance and reduced fall risk    Baseline 9/14- 6 seconds best    Time 8   Period Weeks   Status On-going     PT LONG TERM GOAL #3   Title Patient to be able to ambulate unlimited distances with no assistive device and minimal  fatigue or unsteadiness in order to faciltiate safe return to community based activities    Baseline 9/14- using cane but dependence on  it is decreasing    Time 8   Period Weeks   Status On-going     PT LONG TERM GOAL #4   Title Patient to be participatory in regular aerobic exercise program, at least 3 days per week, at least 20-30 minutes in duration, in order to maintain functional gains and to improve overall health status    Time 8   Period Weeks   Status On-going               Plan - 09/22/16 1101    Clinical Impression Statement Continued working on general balance activities, both static and dynamic, to improve coordination and reduce overall fall risk. Patient does show improved skills and performance with some activities however does continue to be limited with more dynamic tasks as well as most tasks involving SLS based activities. He reports that he feels at about 90% of his best, however at this time recommend short extension, 2-3 sessions, to continue focus on balance before DC.    Rehab Potential Good   PT Frequency 2x / week   PT Duration 3 weeks   PT Treatment/Interventions ADLs/Self Care Home Management;Cryotherapy;Moist Heat;DME Instruction;Gait training;Stair training;Functional mobility training;Therapeutic activities;Therapeutic exercise;Balance training;Neuromuscular re-education;Patient/family education;Manual techniques;Passive range of motion;Energy conservation;Taping   PT Next Visit Plan  Likely DC within 2-3 sessions. continue focus on balance, gait, functional activity tolerance. NO BENDING, LIFTING, TWISTING (BLT PRECAUTIONS). Continue to encourage patient to contaact MD to see if he can come out of back brace yet.    PT Home Exercise Plan Consider addition of tandem stance, supine neck retraction, and supine T-spine towel roll stretch to HEP.    Consulted and Agree with Plan of Care Patient      Patient will benefit from skilled therapeutic  intervention in order to improve the following deficits and impairments:  Abnormal gait, Improper body mechanics, Pain, Decreased coordination, Decreased mobility, Postural dysfunction, Decreased activity tolerance, Decreased strength, Decreased balance, Difficulty walking, Impaired flexibility  Visit Diagnosis: Midline low back pain, with sciatica presence unspecified  Muscle weakness (generalized)  Unsteadiness on feet  Difficulty in walking, not elsewhere classified  Other symptoms and signs involving the musculoskeletal system     Problem List Patient Active Problem List   Diagnosis Date Noted  . Postural hypotension 07/23/2016  . Dysphagia 07/23/2016  . Obstructive sleep apnea 07/21/2016  . Hyperglycemia 07/21/2016  . Anemia, unspecified 07/21/2016  . Spinal stenosis of lumbar region 07/15/2016  . Bladder tumor 07/27/2015  . HNP (herniated nucleus pulposus), cervical 06/05/2013    Class: Diagnosis of  . 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  . Muscle spasms of neck 04/04/2013  . 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 OA (osteoarthritis) of knee 02/22/2013  . Iliotibial band syndrome of left side 11/22/2012  . Localized, primary osteoarthritis of the ankle and foot 10/11/2012  . Difficulty in walking(719.7) 08/17/2012  . Peroneal tendinitis 08/16/2012  . Precordial pain 02/09/2012  . Essential hypertension, benign 02/09/2012  . Mixed hyperlipidemia 02/09/2012  . Dysthymia 02/04/2012  . Abnormality of gait 12/23/2011  . Lateral epicondylitis/tennis elbow 05/06/2011  . TENOSYNOVITIS OF FOOT AND ANKLE 10/22/2010    Deniece Ree PT, DPT [facility] [address] [place], [place], [address] Phone: [phone]   Fax:  [phone]  Name: Zachary [name] MRN: [id number] Date of Birth: [birth year]

## 2016-09-24 ENCOUNTER — Encounter (HOSPITAL_COMMUNITY): Payer: Self-pay | Admitting: Physical Therapy

## 2016-09-25 DIAGNOSIS — M5137 Other intervertebral disc degeneration, lumbosacral region: Secondary | ICD-10-CM | POA: Diagnosis not present

## 2016-09-29 ENCOUNTER — Ambulatory Visit (HOSPITAL_COMMUNITY): Payer: Medicare Other | Attending: Internal Medicine | Admitting: Physical Therapy

## 2016-09-29 DIAGNOSIS — R2681 Unsteadiness on feet: Secondary | ICD-10-CM | POA: Diagnosis not present

## 2016-09-29 DIAGNOSIS — R262 Difficulty in walking, not elsewhere classified: Secondary | ICD-10-CM | POA: Diagnosis not present

## 2016-09-29 DIAGNOSIS — M6281 Muscle weakness (generalized): Secondary | ICD-10-CM | POA: Insufficient documentation

## 2016-09-29 DIAGNOSIS — R29898 Other symptoms and signs involving the musculoskeletal system: Secondary | ICD-10-CM | POA: Diagnosis not present

## 2016-09-29 NOTE — Therapy (Signed)
[facility] [address] [place], [place], [address] Phone: [phone]   Fax:  [phone]  Physical Therapy Treatment (Discharge)  Patient Details  Name: Zachary [name] MRN: [id number] Date of Birth: May 19, 1946 Referring Provider: Einar Crow   Encounter Date: 09/29/2016      PT End of Session - 09/29/16 1240    Visit Number 10   Number of Visits 10   Authorization Type Medicare/[employer]  (G-code done 10th session)   Authorization Time Period 08/12/16 to 10/12/16   Authorization - Visit Number 10   Authorization - Number of Visits 17   PT Start Time 0949   PT Stop Time 1031   PT Time Calculation (min) 42 min   Activity Tolerance Patient tolerated treatment well   Behavior During Therapy [facility] for tasks assessed/performed      Past Medical History:  Diagnosis Date  . Anxiety    takes Valium as needed  . Arthritis   . Bladder cancer (HCC)    takes Rapaflo daily  . Blood dyscrasia    07/08/16: pt told he was a "free bleeder" after bleeding a lot when derm cut off mole on forehead. No excessive bleeding with minor wounds at home, no bleeding problems perioperatively with prior surgeries.  . Chronic back pain    spondylolisthesis  . Cluster headaches   . Depression    takes Lexapro daily  . Essential hypertension, benign    takes Diovan-HCT daily  . Joint pain   . Obstructive sleep apnea on CPAP    uses CPAP @ night  . Pneumonia    "several times"--last time about 3-4 yrs ago  . PONV (postoperative nausea and vomiting)    Hx: of "sometimes"  . Prediabetes     Past Surgical History:  Procedure Laterality Date  . ANTERIOR CERVICAL DECOMP/DISCECTOMY FUSION N/A 06/05/2013   Procedure:  C3-4 Anterior Cervical Discectomy and Fusion, Allograft, Plate;  Surgeon: Eldred Manges, MD;  Location: [facility] OR;  Service: Orthopedics;  Laterality: N/A;  C3-4 Anterior Cervical Discectomy and Fusion, Allograft, Plate  . Arthroscopic  knee surgery    . BACK SURGERY      x 2  . BLADDER SURGERY     x 5 to remove tumor  . CARPAL TUNNEL RELEASE    . CATARACT EXTRACTION W/PHACO  12/19/2012   Procedure: CATARACT EXTRACTION PHACO AND INTRAOCULAR LENS PLACEMENT (IOC);  Surgeon: Gemma Payor, MD;  Location: [facility] ORS;  Service: Ophthalmology;  Laterality: Left;  CDE: 26.80  . CATARACT EXTRACTION W/PHACO  01/09/2013   Procedure: CATARACT EXTRACTION PHACO AND INTRAOCULAR LENS PLACEMENT (IOC);  Surgeon: Gemma Payor, MD;  Location: [facility] ORS;  Service: Ophthalmology;  Laterality: Right;  CDE:22.83  . CERVICAL FUSION  06/05/2013   C 3  C4  . CYSTOSCOPY    . REFRACTIVE SURGERY     Hx: of  . RETINAL DETACHMENT SURGERY    . TRANSURETHRAL RESECTION OF BLADDER TUMOR Right 07/27/2015   Procedure: TRANSURETHRAL RESECTION OF BLADDER TUMOR (TURBT);  Surgeon: Jethro Bolus, MD;  Location: [facility] ORS;  Service: Urology;  Laterality: Right;    There were no vitals filed for this visit.      Subjective Assessment - 09/29/16 0953    Subjective Patient arrives today stating he is feeling good, he forgot to wear his brace to football practice and felt fine without it. He saw his MD last friday, but he is not sure if he can come out  of his brace yet. He still feels a little wobbly and rates himself as 80/100 with that last 20% being balance. No falls or close calls, it is still difficult for him walking over grass/gravel/etc however.    Pertinent History HTN, OSA, hypotension, history of cervical HNP, anxiety, pre-diabetic, history of cervical and lumbar surgeries    How long can you sit comfortably? 10/3- unlimited    How long can you stand comfortably? 10/3- 10-15 minutes    How long can you walk comfortably? 10/3- 15 minutes    Patient Stated Goals get back to PLOF, improve balance and walking without SPC   Currently in Pain? No/denies            [facility] PT Assessment - 09/29/16 0001      Assessment   Medical Diagnosis spinal stenosis     Referring Provider Veleta Miners    Onset Date/Surgical Date 07/14/16   Next MD Visit Surgeon in 6 weeks      Precautions   Precaution Comments NO BENDING, LIFTING, TWISTING     Balance Screen   Has the patient fallen in the past 6 months Yes   How many times? 1 tripped on steps going up    Has the patient had a decrease in activity level because of a fear of falling?  No   Is the patient reluctant to leave their home because of a fear of falling?  No     Prior Function   Level of Independence Independent;Independent with basic ADLs;Independent with gait;Independent with transfers   Vocation Retired     Observation/Other Assessments   Focus on Therapeutic Outcomes (FOTO)  33% limited      Strength   Right Hip Flexion 5/5   Right Hip ABduction 4+/5   Left Hip Flexion 5/5   Left Hip ABduction 4+/5   Right Knee Flexion 4+/5   Right Knee Extension 5/5   Left Knee Flexion 4+/5   Left Knee Extension 4+/5   Right Ankle Dorsiflexion 5/5   Left Ankle Dorsiflexion 5/5     6 minute walk test results    Aerobic Endurance Distance Walked 617   Endurance additional comments 3MWT      High Level Balance   High Level Balance Comments TUG 8.5 second no device; SLS 4-6 seconds B best (but has done better in regular tx sessions)                             PT Education - 09/29/16 1239    Education provided Yes   Education Details progress with skilled PT services, DC today, HEP updates, importance of regular physical activity, call MD to see about coming out of back brace    Person(s) Educated Patient   Methods Explanation;Demonstration;Handout   Comprehension Verbalized understanding;Returned demonstration          PT Short Term Goals - 09/29/16 1018      PT SHORT TERM GOAL #1   Title Patient will be able to ambulate at least 639f with SPC during 3MWT in order to demonstrate improved mobility and safety with community ambulation    Baseline 10/3- 6126fbut  did have a couple slow downs due to other patients that limited distance, functionally could make [id number] these    Time 4   Period Weeks   Status Achieved     PT SHORT TERM GOAL #2   Title Patient to be able to  perform TUG test in 10 seconds or less with SPC to show improved mobility and reduced fall risk    Baseline 2023-10-13- 8.5 seconds no device    Time 4   Period Weeks   Status Achieved     PT SHORT TERM GOAL #3   Title Patient to be participatory in regular walking program, at least 5 days per week and at least 20 minutes in duration in order to build functional activity tolerance and improve mobility    Time 4   Period Weeks   Status On-going     PT SHORT TERM GOAL #4   Title Patient to be independent in correctly and consistently performing appropriate HEP, to be updated PRN    Baseline Oct 13, 2023- at least 3 times per week    Time 4   Period Weeks   Status Partially Met           PT Long Term Goals - 2016/10/12 1020      PT LONG TERM GOAL #1   Title Patient to demonstrate strength 5/5 in all tested muscle groups in order to facilitate improved mobility within community    Time 8   Period Weeks   Status Partially Met     PT LONG TERM GOAL #2   Title Patient to be able to maintain SLS for 30 seconds each LE in order to show improved balance and reduced fall risk    Time 8   Period Weeks   Status On-going     PT LONG TERM GOAL #3   Title Patient to be able to ambulate unlimited distances with no assistive device and minimal fatigue or unsteadiness in order to faciltiate safe return to community based activities    Baseline October 13, 2023- gets fatigued but can walk as far as he wants    Time 8   Period Weeks   Status Achieved     PT LONG TERM GOAL #4   Title Patient to be participatory in regular aerobic exercise program, at least 3 days per week, at least 20-30 minutes in duration, in order to maintain functional gains and to improve overall health status    Time 8    Period Weeks   Status On-going               Plan - 10/12/2016 1241    Clinical Impression Statement Re-assessment performed today. Patient shows improved general functional scores all on all fronts today, with his main remaining area of difficulty being balance- more specifically SLS. Updated patient's HEP today and encouraged regular physical activity on aerobic plane moving forward. DC today due to high level of function.    Rehab Potential Good   PT Next Visit Plan DC today    Consulted and Agree with Plan of Care Patient      Patient will benefit from skilled therapeutic intervention in order to improve the following deficits and impairments:  Abnormal gait, Improper body mechanics, Pain, Decreased coordination, Decreased mobility, Postural dysfunction, Decreased activity tolerance, Decreased strength, Decreased balance, Difficulty walking, Impaired flexibility  Visit Diagnosis: Muscle weakness (generalized)  Unsteadiness on feet  Difficulty in walking, not elsewhere classified  Other symptoms and signs involving the musculoskeletal system       G-Codes - Oct 12, 2016 1244    Functional Assessment Tool Used Based on skilled clinical assessment of gait, strength, balance, functional activity tolerance    Functional Limitation Mobility: Walking and moving around   Mobility: Walking and Moving Around Goal Status [phone])  At least 1 percent but less than 20 percent impaired, limited or restricted   Mobility: Walking and Moving Around Discharge Status [phone]) At least 20 percent but less than 40 percent impaired, limited or restricted      Problem List Patient Active Problem List   Diagnosis Date Noted  . Postural hypotension 07/23/2016  . Dysphagia 07/23/2016  . Obstructive sleep apnea 07/21/2016  . Hyperglycemia 07/21/2016  . Anemia, unspecified 07/21/2016  . Spinal stenosis of lumbar region 07/15/2016  . Bladder tumor 07/27/2015  . HNP (herniated nucleus pulposus),  cervical 06/05/2013    Class: Diagnosis of  . 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  . Muscle spasms of neck 04/04/2013  . 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 OA (osteoarthritis) of knee 02/22/2013  . Iliotibial band syndrome of left side 11/22/2012  . Localized, primary osteoarthritis of the ankle and foot 10/11/2012  . Difficulty in walking(719.7) 08/17/2012  . Peroneal tendinitis 08/16/2012  . Precordial pain 02/09/2012  . Essential hypertension, benign 02/09/2012  . Mixed hyperlipidemia 02/09/2012  . Dysthymia 02/04/2012  . Abnormality of gait 12/23/2011  . Lateral epicondylitis/tennis elbow 05/06/2011  . TENOSYNOVITIS OF FOOT AND ANKLE 10/22/2010   PHYSICAL THERAPY DISCHARGE SUMMARY  Visits from Start of Care: 10  Current functional level related to goals / functional outcomes: Patient doing quite well at this time, scores well on all functional outcomes and assessments and appears ready for DC at this time. DC due to high level of function.    Remaining deficits: Mild functional weakness, unsteadiness, reduced functional activity tolerance    Education / Equipment: HEP updates, HEP compliance, importance of regular physical activity, talk to MD about coming out of back brace  Plan: Patient agrees to discharge.  Patient goals were partially met. Patient is being discharged due to being pleased with the current functional level.  ?????      Deniece Ree PT, DPT [facility] [address] [place], [place], [address] Phone: [phone]   Fax:  [phone]  Name: Zachary [name] MRN: [id number] Date of Birth: July 01, 1946

## 2016-09-29 NOTE — Patient Instructions (Signed)
   SINGLE LEG STANCE - SLS  Stand on one leg and maintain your balance. Hold for as long as you can, then switch sides.  Repeat three times each side, 3-5 times per week.     TANDEM STANCE WITH SUPPORT  Stand in front of a chair, table or counter top for support. Then place the heel of one foot so that it is touching the toes of the other foot. Maintain your balance in this position.  Hold as long as you can, then switch sides. Repeat three times each side, 3-5 times per week.  Knee Extension: Sit to Stand (Eccentric)    Stand close to chair. Slowly lower self to seated position. _10__ reps per set, _1__ sets per day, _3-5__ days per week. Progress to stopping midway before lowering to chair. Progress to barely touching chair.  Copyright  VHI. All rights reserved.     HIP ABDUCTION - STANDING   While standing, raise your leg out to the side. Keep your knee straight and maintain your toes pointed forward the entire time- it should feel like you are leading with your heel. Hold for 3 seconds before relaxing.   Use your arms for support if needed for balance and safety.   Repeat 10 times each side, 3-5 days per week.    REGULAR WALKING PROGRAM  At least 5 days per week; work up to 20-30 minute walks on level surfaces. As you get more comfortable with the distance and your balance, you can make it harder by increasing your speed.

## 2016-10-01 ENCOUNTER — Encounter (HOSPITAL_COMMUNITY): Payer: Self-pay | Admitting: Physical Therapy

## 2016-10-01 DIAGNOSIS — Z23 Encounter for immunization: Secondary | ICD-10-CM | POA: Diagnosis not present

## 2016-10-15 ENCOUNTER — Encounter (HOSPITAL_COMMUNITY): Payer: Self-pay | Admitting: Psychiatry

## 2016-10-15 ENCOUNTER — Ambulatory Visit (INDEPENDENT_AMBULATORY_CARE_PROVIDER_SITE_OTHER): Payer: Medicare Other | Admitting: Psychiatry

## 2016-10-15 VITALS — BP 136/68 | HR 96 | Ht 72.0 in | Wt 226.0 lb

## 2016-10-15 DIAGNOSIS — F331 Major depressive disorder, recurrent, moderate: Secondary | ICD-10-CM

## 2016-10-15 MED ORDER — DULOXETINE HCL 60 MG PO CPEP: 60.0000 mg | ORAL_CAPSULE | Freq: Every day | ORAL | 2 refills | Status: DC

## 2016-10-15 MED ORDER — DIAZEPAM 5 MG PO TABS: 5.0000 mg | ORAL_TABLET | Freq: Two times a day (BID) | ORAL | 2 refills | Status: DC | PRN

## 2016-10-15 NOTE — Progress Notes (Signed)
Patient ID: Zachary [name], male   DOB: 08-05-1946, 70 y.o.   MRN: [id number] Patient ID: Zachary [name], male   DOB: 01/05/1946, 70 y.o.   MRN: [id number] Patient ID: Zachary [name], male   DOB: [birth year], 70 y.o.   MRN: [id number] Patient ID: Zachary [name], male   DOB: Sep 28, 1946, 70 y.o.   MRN: [id number] Patient ID: Zachary [name], male   DOB: 08/25/1946, 70 y.o.   MRN: [id number] Patient ID: Zachary [name], male   DOB: 08/09/1946, 70 y.o.   MRN: [id number] Patient ID: Zachary [name], male   DOB: 10-12-1946, 70 y.o.   MRN: [id number] Patient ID: Zachary [name], male   DOB: March 31, 1946, 70 y.o.   MRN: [id number] Patient ID: Zachary [name], male   DOB: [birth year], 70 y.o.   MRN: [id number] Patient ID: Zachary [name], male   DOB: [birth year], 70 y.o.   MRN: [id number] Patient ID: Zachary [name], male   DOB: 07-07-1946, 70 y.o.   MRN: [id number] [facility] [id number] Progress Note [name] MRN: [id number] DOB: Sep 04, 1946 Age: 70 y.o.  Date: 10/15/2016 Start Time: 3:10 PM End Time: 3:33 PM  Chief Complaint: Chief Complaint  Patient presents with  . Depression  . Anxiety  . Follow-up   Subjective: "My wife left."  This patient is a 70 year-old married white male who lives with his wife in [place] they have no children. 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  The patient states he got depressed several years ago after his diagnosed with stage IV bladder cancer. He was seeing a psychologist here to deal with this and last year was placed on Lexapro. His cancer is gone now after 5 surgeries. He recently had back surgery as well but is recovered from this with no pain. He states that his mood is been great. He's excited about the football season. He is sleeping well and his energy is good  The patient returns after 2 months. He is now on Cymbalta and his mood  seems to be a little bit better. He still takes the Valium for anxiety as well. His wife has had little contact with him but he states that her apartment lease ends in December and she will have to make a decision about coming back to the marriage. He is not sure what is going to happen but is trying to take it a day of the time. He has finishes physical therapy for his back and is trying to do the exercises at home. 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    Vitals: BP 136/68 (BP Location: Right Arm, Patient Position: Sitting, Cuff Size: Large)   Pulse 96   Ht 6' (1.829 m)   Wt 226 lb (102.5 kg)   BMI 30.65 kg/m   Allergies: Allergies  Allergen Reactions  . Sulfonamide Derivatives Itching, Rash and Other (See Comments)    whelps   Medical History: Past Medical History:  Diagnosis Date  . Anxiety    takes Valium as needed  . Arthritis   . Bladder cancer ([place])    takes Rapaflo daily  . Blood dyscrasia    07/08/16: pt told he was a "free bleeder" after bleeding a lot when derm cut off mole on forehead. No excessive bleeding with  minor wounds at home, no bleeding problems perioperatively with prior surgeries.  . Chronic back pain    spondylolisthesis  . Cluster headaches   . Depression    takes Lexapro daily  . Essential hypertension, benign    takes Diovan-HCT daily  . Joint pain   . Obstructive sleep apnea on CPAP    uses CPAP @ night  . Pneumonia    "several times"--last time about 3-4 yrs ago  . PONV (postoperative nausea and vomiting)    Hx: of "sometimes"  . Prediabetes   Patient has history of back pain, hypertension, tinnitus, Obstructive sleep apnea, knee surgery and back surgery. Patient also has bladder cancer and carpal tunnel release.  His primary care physician is Dr. [name] and he see Dr. Aline Brochure for his pain management.  Surgical History: Past Surgical History:  Procedure Laterality Date  . ANTERIOR CERVICAL  DECOMP/DISCECTOMY FUSION N/A 06/05/2013   Procedure:  C3-4 Anterior Cervical Discectomy and Fusion, Allograft, Plate;  Surgeon: [name], MD;  Location: [place];  Service: Orthopedics;  Laterality: N/A;  C3-4 Anterior Cervical Discectomy and Fusion, Allograft, Plate  . Arthroscopic knee surgery    . BACK SURGERY      x 2  . BLADDER SURGERY     x 5 to remove tumor  . CARPAL TUNNEL RELEASE    . CATARACT EXTRACTION W/PHACO  12/19/2012   Procedure: CATARACT EXTRACTION PHACO AND INTRAOCULAR LENS PLACEMENT (IOC);  Surgeon: [name], MD;  Location: [facility];  Service: Ophthalmology;  Laterality: Left;  CDE: 26.80  . CATARACT EXTRACTION W/PHACO  01/09/2013   Procedure: CATARACT EXTRACTION PHACO AND INTRAOCULAR LENS PLACEMENT (IOC);  Surgeon: [name], MD;  Location: [facility];  Service: Ophthalmology;  Laterality: Right;  CDE:22.83  . CERVICAL FUSION  06/05/2013   C 3  C4  . CYSTOSCOPY    . REFRACTIVE SURGERY     Hx: of  . RETINAL DETACHMENT SURGERY    . TRANSURETHRAL RESECTION OF BLADDER TUMOR Right 07/27/2015   Procedure: TRANSURETHRAL RESECTION OF BLADDER TUMOR (TURBT);  Surgeon: [name], MD;  Location: [facility];  Service: Urology;  Laterality: Right;   Family History: family history includes Alcohol abuse in his maternal grandfather and mother; Anxiety disorder in his sister; Cancer in his father; Depression in his mother; Hypertension in his father, mother, and sister. Reviewed and nothing is new except the neck surgery that is already on the surgical history.  Mental status examination Patient is casually dressed fairly groomed. He is pleasant and cooperative.  He maintained fair eye contact. His speech is clear coherent and fluent. His thought process is logical linear and goal-directed. He denies any auditory or visual hallucination. He denies any active or passive suicidal thinking and homicidal thinking. He described his mood As improved and his affect is brighter His attention and  concentration is good. He's alert and oriented x3. His insight judgment and impulse control is okay.  Lab Results:  Results for orders placed or performed during the hospital encounter of 08/05/16 (from the past 8736 hour(s))  CBC   Collection Time: 08/05/16  7:30 AM  Result Value Ref Range   WBC 8.1 4.0 - 10.5 K/uL   RBC 3.47 (L) 4.22 - 5.81 MIL/uL   Hemoglobin 10.6 (L) 13.0 - 17.0 g/dL   HCT 32.1 (L) 39.0 - 52.0 %   MCV 92.5 78.0 - 100.0 fL   MCH 30.5 26.0 - 34.0 pg   MCHC 33.0 30.0 - 36.0 g/dL  RDW 14.2 11.5 - 15.5 %   Platelets 228 150 - 400 K/uL  Basic metabolic panel   Collection Time: 08/05/16  7:30 AM  Result Value Ref Range   Sodium 139 135 - 145 mmol/L   Potassium 4.1 3.5 - 5.1 mmol/L   Chloride 103 101 - 111 mmol/L   CO2 27 22 - 32 mmol/L   Glucose, Bld 96 65 - 99 mg/dL   BUN 14 6 - 20 mg/dL   Creatinine, Ser 0.98 0.61 - 1.24 mg/dL   Calcium 8.5 (L) 8.9 - 10.3 mg/dL   GFR calc non Af Amer >60 >60 mL/min   GFR calc Af Amer >60 >60 mL/min   Anion gap 9 5 - 15  Results for orders placed or performed during the hospital encounter of 07/24/16 (from the past 8736 hour(s))  Basic metabolic panel   Collection Time: 07/24/16  4:11 PM  Result Value Ref Range   Sodium 134 (L) 135 - 145 mmol/L   Potassium 4.2 3.5 - 5.1 mmol/L   Chloride 102 101 - 111 mmol/L   CO2 25 22 - 32 mmol/L   Glucose, Bld 88 65 - 99 mg/dL   BUN 33 (H) 6 - 20 mg/dL   Creatinine, Ser 1.15 0.61 - 1.24 mg/dL   Calcium 8.3 (L) 8.9 - 10.3 mg/dL   GFR calc non Af Amer >60 >60 mL/min   GFR calc Af Amer >60 >60 mL/min   Anion gap 7 5 - 15  CBC with Differential   Collection Time: 07/24/16  4:11 PM  Result Value Ref Range   WBC 16.7 (H) 4.0 - 10.5 K/uL   RBC 3.71 (L) 4.22 - 5.81 MIL/uL   Hemoglobin 11.4 (L) 13.0 - 17.0 g/dL   HCT 34.0 (L) 39.0 - 52.0 %   MCV 91.6 78.0 - 100.0 fL   MCH 30.7 26.0 - 34.0 pg   MCHC 33.5 30.0 - 36.0 g/dL   RDW 14.8 11.5 - 15.5 %   Platelets 229 150 - 400 K/uL    Neutrophils Relative % 78 %   Neutro Abs 13.2 (H) 1.7 - 7.7 K/uL   Lymphocytes Relative 13 %   Lymphs Abs 2.2 0.7 - 4.0 K/uL   Monocytes Relative 6 %   Monocytes Absolute 0.9 0.1 - 1.0 K/uL   Eosinophils Relative 3 %   Eosinophils Absolute 0.5 0.0 - 0.7 K/uL   Basophils Relative 0 %   Basophils Absolute 0.0 0.0 - 0.1 K/uL  Urinalysis, Routine w reflex microscopic (not at [facility])   Collection Time: 07/24/16  4:15 PM  Result Value Ref Range   Color, Urine YELLOW YELLOW   APPearance CLEAR CLEAR   Specific Gravity, Urine 1.020 1.005 - 1.030   pH 5.5 5.0 - 8.0   Glucose, UA NEGATIVE NEGATIVE mg/dL   Hgb urine dipstick NEGATIVE NEGATIVE   Bilirubin Urine NEGATIVE NEGATIVE   Ketones, ur NEGATIVE NEGATIVE mg/dL   Protein, ur NEGATIVE NEGATIVE mg/dL   Nitrite NEGATIVE NEGATIVE   Leukocytes, UA NEGATIVE NEGATIVE  I-Stat CG4 Lactic Acid, ED   Collection Time: 07/24/16  4:41 PM  Result Value Ref Range   Lactic Acid, Venous 1.97 (HH) 0.5 - 1.9 mmol/L  I-Stat CG4 Lactic Acid, ED   Collection Time: 07/24/16  7:10 PM  Result Value Ref Range   Lactic Acid, Venous 1.54 0.5 - 1.9 mmol/L  Results for orders placed or performed during the hospital encounter of 07/22/16 (from the past 8736 hour(s))  CBC  Collection Time: 07/22/16  7:00 AM  Result Value Ref Range   WBC 14.2 (H) 4.0 - 10.5 K/uL   RBC 3.70 (L) 4.22 - 5.81 MIL/uL   Hemoglobin 11.3 (L) 13.0 - 17.0 g/dL   HCT 33.7 (L) 39.0 - 52.0 %   MCV 91.1 78.0 - 100.0 fL   MCH 30.5 26.0 - 34.0 pg   MCHC 33.5 30.0 - 36.0 g/dL   RDW 14.4 11.5 - 15.5 %   Platelets 197 150 - 400 K/uL  Basic metabolic panel   Collection Time: 07/22/16  7:00 AM  Result Value Ref Range   Sodium 136 135 - 145 mmol/L   Potassium 3.6 3.5 - 5.1 mmol/L   Chloride 103 101 - 111 mmol/L   CO2 28 22 - 32 mmol/L   Glucose, Bld 90 65 - 99 mg/dL   BUN 28 (H) 6 - 20 mg/dL   Creatinine, Ser 0.89 0.61 - 1.24 mg/dL   Calcium 7.8 (L) 8.9 - 10.3 mg/dL   GFR calc non Af Amer  >60 >60 mL/min   GFR calc Af Amer >60 >60 mL/min   Anion gap 5 5 - 15  Hemoglobin A1c   Collection Time: 07/22/16  7:00 AM  Result Value Ref Range   Hgb A1c MFr Bld 5.8 (H) 4.8 - 5.6 %   Mean Plasma Glucose 120 mg/dL  Results for orders placed or performed during the hospital encounter of 07/15/16 (from the past 8736 hour(s))  I-STAT 7, (LYTES, BLD GAS, ICA, H+H)   Collection Time: 07/15/16 12:39 PM  Result Value Ref Range   pH, Arterial 7.281 (L) 7.350 - 7.450   pCO2 arterial 54.7 (H) 35.0 - 45.0 mmHg   pO2, Arterial 124.0 (H) 80.0 - 100.0 mmHg   Bicarbonate 26.3 (H) 20.0 - 24.0 mEq/L   TCO2 28 0 - 100 mmol/L   O2 Saturation 98.0 %   Acid-base deficit 2.0 0.0 - 2.0 mmol/L   Sodium 141 135 - 145 mmol/L   Potassium 3.4 (L) 3.5 - 5.1 mmol/L   Calcium, Ion 1.21 1.12 - 1.23 mmol/L   HCT 35.0 (L) 39.0 - 52.0 %   Hemoglobin 11.9 (L) 13.0 - 17.0 g/dL   Patient temperature 35.1 C    Sample type ARTERIAL   Troponin I   Collection Time: 07/15/16  3:27 PM  Result Value Ref Range   Troponin I <0.03 <0.03 ng/mL  Basic metabolic panel   Collection Time: 07/15/16  3:27 PM  Result Value Ref Range   Sodium 137 135 - 145 mmol/L   Potassium 3.1 (L) 3.5 - 5.1 mmol/L   Chloride 105 101 - 111 mmol/L   CO2 27 22 - 32 mmol/L   Glucose, Bld 177 (H) 65 - 99 mg/dL   BUN 9 6 - 20 mg/dL   Creatinine, Ser 1.15 0.61 - 1.24 mg/dL   Calcium 7.8 (L) 8.9 - 10.3 mg/dL   GFR calc non Af Amer >60 >60 mL/min   GFR calc Af Amer >60 >60 mL/min   Anion gap 5 5 - 15  Magnesium   Collection Time: 07/15/16  3:27 PM  Result Value Ref Range   Magnesium 1.3 (L) 1.7 - 2.4 mg/dL  CBC with Differential/Platelet   Collection Time: 07/15/16  3:27 PM  Result Value Ref Range   WBC 20.6 (H) 4.0 - 10.5 K/uL   RBC 3.82 (L) 4.22 - 5.81 MIL/uL   Hemoglobin 11.1 (L) 13.0 - 17.0 g/dL   HCT 34.1 (L)  39.0 - 52.0 %   MCV 89.3 78.0 - 100.0 fL   MCH 29.1 26.0 - 34.0 pg   MCHC 32.6 30.0 - 36.0 g/dL   RDW 13.4 11.5 - 15.5 %    Platelets 164 150 - 400 K/uL   Neutrophils Relative % 95 %   Neutro Abs 19.5 (H) 1.7 - 7.7 K/uL   Lymphocytes Relative 2 %   Lymphs Abs 0.4 (L) 0.7 - 4.0 K/uL   Monocytes Relative 3 %   Monocytes Absolute 0.6 0.1 - 1.0 K/uL   Eosinophils Relative 0 %   Eosinophils Absolute 0.0 0.0 - 0.7 K/uL   Basophils Relative 0 %   Basophils Absolute 0.0 0.0 - 0.1 K/uL  Phosphorus   Collection Time: 07/15/16  3:27 PM  Result Value Ref Range   Phosphorus 3.1 2.5 - 4.6 mg/dL  Calcium, ionized   Collection Time: 07/15/16  3:27 PM  Result Value Ref Range   Calcium, Ionized, Serum 4.7 4.5 - 5.6 mg/dL  ECHOCARDIOGRAM COMPLETE   Collection Time: 07/15/16  3:50 PM  Result Value Ref Range   Weight 3,584 oz   BP 163/86 mmHg   LV PW d 14.6 (A) 0.6 - 1.1 mm   FS 39 28 - 44 %   LA ID, A-P, ES 33 mm   IVS/LV PW RATIO, ED 1.12    Stroke v 41 ml   LVOT VTI 19.1 cm   Reg peak vel 189 cm/s   RV sys press 17 mmHg   LV e' LATERAL 5.33 cm/s   LV E/e' medial 12.48    LV E/e'average 12.48    LA diam index 1.47 cm/m2   LA vol A4C 47.5 ml   E decel time 458 msec   LVOT diameter 21 mm   LVOT area 3.46 cm2   LVOT peak vel 87.2 cm/s   LVOT SV 66 mL   E/e' ratio 12.48    MV pk E vel 66.5 m/s   TR max vel 189 cm/s   MV pk A vel 89.7 m/s   LV sys vol 24 21 - 61 mL   LV sys vol index 11 mL/m2   LV dias vol 65 62 - 150 mL   LV dias vol index 29 mL/m2   MV Dec 458    LA diam end sys 33 mm   Simpson's disk 63    TDI e' medial 5    TDI e' lateral 5.33    TAPSE 18.2 mm  Blood gas, arterial   Collection Time: 07/15/16  4:15 PM  Result Value Ref Range   FIO2 0.70    Delivery systems VENTILATOR    Mode PRESSURE REGULATED VOLUME CONTROL    VT 620 mL   LHR 14 resp/min   Peep/cpap 5.0 cm H20   pH, Arterial 7.415 7.350 - 7.450   pCO2 arterial 40.5 35.0 - 45.0 mmHg   pO2, Arterial 105 (H) 80.0 - 100.0 mmHg   Bicarbonate 26.0 (H) 20.0 - 24.0 mEq/L   TCO2 27.4 0 - 100 mmol/L   Acid-Base Excess 1.6 0.0 - 2.0  mmol/L   O2 Saturation 98.1 %   Patient temperature 95.5    Collection site A-LINE    Drawn by [id number]    Sample type ARTERIAL DRAW    Allens test (pass/fail) PASS PASS  Troponin I   Collection Time: 07/15/16  9:55 PM  Result Value Ref Range   Troponin I <0.03 <0.03 ng/mL  Blood gas, arterial  Collection Time: 07/16/16  3:42 AM  Result Value Ref Range   FIO2 0.50    Delivery systems VENTILATOR    Mode PRESSURE REGULATED VOLUME CONTROL    VT 620 mL   LHR 14 resp/min   Peep/cpap 5.0 cm H20   pH, Arterial 7.420 7.350 - 7.450   pCO2 arterial 41.4 35.0 - 45.0 mmHg   pO2, Arterial 95.8 80.0 - 100.0 mmHg   Bicarbonate 26.3 (H) 20.0 - 24.0 mEq/L   TCO2 27.6 0 - 100 mmol/L   Acid-Base Excess 2.2 (H) 0.0 - 2.0 mmol/L   O2 Saturation 97.6 %   Patient temperature 98.6    Collection site A-LINE    Drawn by [phone]    Sample type ARTERIAL DRAW    Allens test (pass/fail) PASS PASS  Troponin I   Collection Time: 07/16/16  4:00 AM  Result Value Ref Range   Troponin I <0.03 <0.03 ng/mL  CBC with Differential/Platelet   Collection Time: 07/16/16  4:00 AM  Result Value Ref Range   WBC 17.2 (H) 4.0 - 10.5 K/uL   RBC 3.70 (L) 4.22 - 5.81 MIL/uL   Hemoglobin 10.8 (L) 13.0 - 17.0 g/dL   HCT 32.7 (L) 39.0 - 52.0 %   MCV 88.4 78.0 - 100.0 fL   MCH 29.2 26.0 - 34.0 pg   MCHC 33.0 30.0 - 36.0 g/dL   RDW 13.5 11.5 - 15.5 %   Platelets 153 150 - 400 K/uL   Neutrophils Relative % 93 %   Neutro Abs 16.0 (H) 1.7 - 7.7 K/uL   Lymphocytes Relative 5 %   Lymphs Abs 0.8 0.7 - 4.0 K/uL   Monocytes Relative 2 %   Monocytes Absolute 0.3 0.1 - 1.0 K/uL   Eosinophils Relative 0 %   Eosinophils Absolute 0.0 0.0 - 0.7 K/uL   Basophils Relative 0 %   Basophils Absolute 0.0 0.0 - 0.1 K/uL  Magnesium   Collection Time: 07/16/16  4:00 AM  Result Value Ref Range   Magnesium 1.4 (L) 1.7 - 2.4 mg/dL  Renal function panel   Collection Time: 07/16/16  4:00 AM  Result Value Ref Range   Sodium 138 135 - 145  mmol/L   Potassium 4.1 3.5 - 5.1 mmol/L   Chloride 106 101 - 111 mmol/L   CO2 26 22 - 32 mmol/L   Glucose, Bld 138 (H) 65 - 99 mg/dL   BUN 8 6 - 20 mg/dL   Creatinine, Ser 1.03 0.61 - 1.24 mg/dL   Calcium 8.1 (L) 8.9 - 10.3 mg/dL   Phosphorus 2.6 2.5 - 4.6 mg/dL   Albumin 3.0 (L) 3.5 - 5.0 g/dL   GFR calc non Af Amer >60 >60 mL/min   GFR calc Af Amer >60 >60 mL/min   Anion gap 6 5 - 15  Basic metabolic panel   Collection Time: 07/17/16  6:05 AM  Result Value Ref Range   Sodium 141 135 - 145 mmol/L   Potassium 4.2 3.5 - 5.1 mmol/L   Chloride 107 101 - 111 mmol/L   CO2 28 22 - 32 mmol/L   Glucose, Bld 150 (H) 65 - 99 mg/dL   BUN 15 6 - 20 mg/dL   Creatinine, Ser 1.08 0.61 - 1.24 mg/dL   Calcium 8.1 (L) 8.9 - 10.3 mg/dL   GFR calc non Af Amer >60 >60 mL/min   GFR calc Af Amer >60 >60 mL/min   Anion gap 6 5 - 15  CBC  Collection Time: 07/17/16  6:05 AM  Result Value Ref Range   WBC 18.2 (H) 4.0 - 10.5 K/uL   RBC 3.46 (L) 4.22 - 5.81 MIL/uL   Hemoglobin 10.1 (L) 13.0 - 17.0 g/dL   HCT 30.6 (L) 39.0 - 52.0 %   MCV 88.4 78.0 - 100.0 fL   MCH 29.2 26.0 - 34.0 pg   MCHC 33.0 30.0 - 36.0 g/dL   RDW 13.9 11.5 - 15.5 %   Platelets 179 150 - 400 K/uL  I-STAT 4, (NA,K, GLUC, HGB,HCT)   Collection Time: 07/17/16  6:02 PM  Result Value Ref Range   Sodium 144 135 - 145 mmol/L   Potassium 4.1 3.5 - 5.1 mmol/L   Glucose, Bld 138 (H) 65 - 99 mg/dL   HCT 27.0 (L) 39.0 - 52.0 %   Hemoglobin 9.2 (L) 13.0 - 17.0 g/dL  Results for orders placed or performed during the hospital encounter of 07/07/16 (from the past 8736 hour(s))  Surgical pcr screen   Collection Time: 07/07/16 11:30 AM  Result Value Ref Range   MRSA, PCR NEGATIVE NEGATIVE   Staphylococcus aureus NEGATIVE NEGATIVE  Basic metabolic panel   Collection Time: 07/07/16 11:30 AM  Result Value Ref Range   Sodium 141 135 - 145 mmol/L   Potassium 3.6 3.5 - 5.1 mmol/L   Chloride 107 101 - 111 mmol/L   CO2 26 22 - 32 mmol/L    Glucose, Bld 108 (H) 65 - 99 mg/dL   BUN 17 6 - 20 mg/dL   Creatinine, Ser 1.13 0.61 - 1.24 mg/dL   Calcium 8.9 8.9 - 10.3 mg/dL   GFR calc non Af Amer >60 >60 mL/min   GFR calc Af Amer >60 >60 mL/min   Anion gap 8 5 - 15  CBC   Collection Time: 07/07/16 11:30 AM  Result Value Ref Range   WBC 8.6 4.0 - 10.5 K/uL   RBC 4.91 4.22 - 5.81 MIL/uL   Hemoglobin 14.9 13.0 - 17.0 g/dL   HCT 45.0 39.0 - 52.0 %   MCV 91.6 78.0 - 100.0 fL   MCH 30.3 26.0 - 34.0 pg   MCHC 33.1 30.0 - 36.0 g/dL   RDW 14.3 11.5 - 15.5 %   Platelets 202 150 - 400 K/uL  Type and screen [place]   Collection Time: 07/07/16 11:40 AM  Result Value Ref Range   ABO/RH(D) O POS    Antibody Screen NEG    Sample Expiration 07/21/2016    Extend sample reason NO TRANSFUSIONS OR PREGNANCY IN THE PAST 3 MONTHS   ABO/Rh   Collection Time: 07/07/16 11:40 AM  Result Value Ref Range   ABO/RH(D) O POS   Results for orders placed or performed during the hospital encounter of 05/17/16 (from the past 8736 hour(s))  Basic metabolic panel   Collection Time: 05/17/16 10:50 AM  Result Value Ref Range   Sodium 138 135 - 145 mmol/L   Potassium 3.9 3.5 - 5.1 mmol/L   Chloride 106 101 - 111 mmol/L   CO2 26 22 - 32 mmol/L   Glucose, Bld 100 (H) 65 - 99 mg/dL   BUN 18 6 - 20 mg/dL   Creatinine, Ser 1.16 0.61 - 1.24 mg/dL   Calcium 9.1 8.9 - 10.3 mg/dL   GFR calc non Af Amer >60 >60 mL/min   GFR calc Af Amer >60 >60 mL/min   Anion gap 6 5 - 15  CBC   Collection Time:  05/17/16 10:50 AM  Result Value Ref Range   WBC 9.8 4.0 - 10.5 K/uL   RBC 5.24 4.22 - 5.81 MIL/uL   Hemoglobin 15.4 13.0 - 17.0 g/dL   HCT 47.0 39.0 - 52.0 %   MCV 89.7 78.0 - 100.0 fL   MCH 29.4 26.0 - 34.0 pg   MCHC 32.8 30.0 - 36.0 g/dL   RDW 13.9 11.5 - 15.5 %   Platelets 206 150 - 400 K/uL  I-stat troponin, ED   Collection Time: 05/17/16 10:54 AM  Result Value Ref Range   Troponin i, poc 0.00 0.00 - 0.08 ng/mL   Comment 3           Urinalysis, Routine w reflex microscopic   Collection Time: 05/17/16 12:07 PM  Result Value Ref Range   Color, Urine YELLOW YELLOW   APPearance CLEAR CLEAR   Specific Gravity, Urine 1.020 1.005 - 1.030   pH 5.0 5.0 - 8.0   Glucose, UA NEGATIVE NEGATIVE mg/dL   Hgb urine dipstick MODERATE (A) NEGATIVE   Bilirubin Urine NEGATIVE NEGATIVE   Ketones, ur NEGATIVE NEGATIVE mg/dL   Protein, ur 30 (A) NEGATIVE mg/dL   Nitrite NEGATIVE NEGATIVE   Leukocytes, UA TRACE (A) NEGATIVE  Urine microscopic-add on   Collection Time: 05/17/16 12:07 PM  Result Value Ref Range   Squamous Epithelial / LPF 0-5 (A) NONE SEEN   WBC, UA 6-30 0 - 5 WBC/hpf   RBC / HPF 6-30 0 - 5 RBC/hpf   Bacteria, UA RARE (A) NONE SEEN   Casts HYALINE CASTS (A) NEGATIVE   Urine-Other MUCOUS PRESENT   Results for orders placed or performed during the hospital encounter of 12/26/15 (from the past 8736 hour(s))  Urinalysis, Routine w reflex microscopic (not at [facility])   Collection Time: 12/26/15  6:22 AM  Result Value Ref Range   Color, Urine RED (A) YELLOW   APPearance TURBID (A) CLEAR   Specific Gravity, Urine 1.021 1.005 - 1.030   pH 5.0 5.0 - 8.0   Glucose, UA NEGATIVE NEGATIVE mg/dL   Hgb urine dipstick LARGE (A) NEGATIVE   Bilirubin Urine SMALL (A) NEGATIVE   Ketones, ur NEGATIVE NEGATIVE mg/dL   Protein, ur 100 (A) NEGATIVE mg/dL   Nitrite POSITIVE (A) NEGATIVE   Leukocytes, UA LARGE (A) NEGATIVE  Urine microscopic-add on   Collection Time: 12/26/15  6:22 AM  Result Value Ref Range   Squamous Epithelial / LPF 6-30 (A) NONE SEEN   WBC, UA 6-30 0 - 5 WBC/hpf   RBC / HPF TOO NUMEROUS TO COUNT 0 - 5 RBC/hpf   Bacteria, UA MANY (A) NONE SEEN  Urine culture   Collection Time: 12/26/15  6:30 AM  Result Value Ref Range   Specimen Description URINE, CLEAN CATCH    Special Requests Normal    Culture      >=100,000 COLONIES/mL LECLERCIA ADECARBOXYLATA Performed at [facility]    Report Status 12/29/2015  FINAL    Organism ID, Bacteria LECLERCIA ADECARBOXYLATA       Susceptibility   Leclercia adecarboxylata - MIC*    AMPICILLIN <=2 SENSITIVE Sensitive     CEFAZOLIN <=4 SENSITIVE Sensitive     CEFTRIAXONE <=1 SENSITIVE Sensitive     CIPROFLOXACIN <=0.25 SENSITIVE Sensitive     GENTAMICIN <=1 SENSITIVE Sensitive     IMIPENEM <=0.25 SENSITIVE Sensitive     NITROFURANTOIN <=16 SENSITIVE Sensitive     TRIMETH/SULFA <=20 SENSITIVE Sensitive     AMPICILLIN/SULBACTAM <=2 SENSITIVE  Sensitive     PIP/TAZO <=4 SENSITIVE Sensitive     * >=100,000 COLONIES/mL LECLERCIA ADECARBOXYLATA  I-Stat Chem 8, ED   Collection Time: 12/26/15  8:05 AM  Result Value Ref Range   Sodium 144 135 - 145 mmol/L   Potassium 3.7 3.5 - 5.1 mmol/L   Chloride 106 101 - 111 mmol/L   BUN 24 (H) 6 - 20 mg/dL   Creatinine, Ser 1.00 0.61 - 1.24 mg/dL   Glucose, Bld 92 65 - 99 mg/dL   Calcium, Ion 1.11 (L) 1.13 - 1.30 mmol/L   TCO2 26 0 - 100 mmol/L   Hemoglobin 15.0 13.0 - 17.0 g/dL   HCT 44.0 39.0 - 52.0 %   Assessment Axis I Major depressive disorder, 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  I reviewed CC, tobacco/med/surg Hx, meds effects/ side effects, problem list, therapies and responses as well as current situation/symptoms discussed options. Patient will Continue Cymbalta 60 mg daily. He can continue Valium 5 mg twice a day as needed and was reminded not to take it around the time of oxycodone. He'll return in 3 months but call sooner if his depression worsens See orders and pt instructions for more details.  MEDICATIONS this encounter: Meds ordered this encounter  Medications  . DULoxetine (CYMBALTA) 60 MG capsule    Sig: Take 1 capsule (60 mg total) by mouth daily.    Dispense:  30 capsule    Refill:  2  . diazepam (VALIUM) 5 MG tablet    Sig: Take 1 tablet (5 mg total)  by mouth 2 (two) times daily as needed for anxiety.    Dispense:  60 tablet    Refill:  2    Medical Decision Making Problem Points:  Established problem, stable/improving (1), New problem, with no additional work-up planned (3), Review of last therapy session (1) and Review of psycho-social stressors (1) Data Points:  Review or order clinical lab tests (1) Review of medication regiment & side effects (2) Review of new medications or change in dosage (2)  I certify that outpatient services furnished can reasonably be expected to improve the patient's condition.   [name], MD

## 2016-11-03 DIAGNOSIS — M5137 Other intervertebral disc degeneration, lumbosacral region: Secondary | ICD-10-CM | POA: Diagnosis not present

## 2016-12-04 DIAGNOSIS — Z6831 Body mass index (BMI) 31.0-31.9, adult: Secondary | ICD-10-CM | POA: Diagnosis not present

## 2016-12-04 DIAGNOSIS — F419 Anxiety disorder, unspecified: Secondary | ICD-10-CM | POA: Diagnosis not present

## 2016-12-04 DIAGNOSIS — G3184 Mild cognitive impairment, so stated: Secondary | ICD-10-CM | POA: Diagnosis not present

## 2016-12-04 DIAGNOSIS — M62838 Other muscle spasm: Secondary | ICD-10-CM | POA: Diagnosis not present

## 2016-12-04 DIAGNOSIS — H539 Unspecified visual disturbance: Secondary | ICD-10-CM | POA: Diagnosis not present

## 2016-12-04 DIAGNOSIS — F339 Major depressive disorder, recurrent, unspecified: Secondary | ICD-10-CM | POA: Diagnosis not present

## 2016-12-04 DIAGNOSIS — R51 Headache: Secondary | ICD-10-CM | POA: Diagnosis not present

## 2016-12-17 DIAGNOSIS — H524 Presbyopia: Secondary | ICD-10-CM | POA: Diagnosis not present

## 2016-12-17 DIAGNOSIS — H26492 Other secondary cataract, left eye: Secondary | ICD-10-CM | POA: Diagnosis not present

## 2016-12-17 DIAGNOSIS — H59812 Chorioretinal scars after surgery for detachment, left eye: Secondary | ICD-10-CM | POA: Diagnosis not present

## 2016-12-17 DIAGNOSIS — Z961 Presence of intraocular lens: Secondary | ICD-10-CM | POA: Diagnosis not present

## 2016-12-17 DIAGNOSIS — H338 Other retinal detachments: Secondary | ICD-10-CM | POA: Diagnosis not present

## 2016-12-17 DIAGNOSIS — H59811 Chorioretinal scars after surgery for detachment, right eye: Secondary | ICD-10-CM | POA: Diagnosis not present

## 2016-12-17 DIAGNOSIS — H00029 Hordeolum internum unspecified eye, unspecified eyelid: Secondary | ICD-10-CM | POA: Diagnosis not present

## 2016-12-23 ENCOUNTER — Other Ambulatory Visit: Payer: Self-pay | Admitting: Emergency Medicine

## 2016-12-23 ENCOUNTER — Ambulatory Visit (HOSPITAL_COMMUNITY)
Admission: RE | Admit: 2016-12-23 | Discharge: 2016-12-23 | Disposition: A | Discharge status: Home / Self Care | Payer: Medicare Other | Source: Ambulatory Visit | Attending: Emergency Medicine | Admitting: Emergency Medicine

## 2016-12-23 ENCOUNTER — Other Ambulatory Visit (HOSPITAL_COMMUNITY): Payer: Self-pay | Admitting: Emergency Medicine

## 2016-12-23 ENCOUNTER — Telehealth (HOSPITAL_COMMUNITY): Payer: Self-pay | Admitting: *Deleted

## 2016-12-23 DIAGNOSIS — M542 Cervicalgia: Secondary | ICD-10-CM

## 2016-12-23 DIAGNOSIS — S63653A Sprain of metacarpophalangeal joint of left middle finger, initial encounter: Secondary | ICD-10-CM | POA: Diagnosis not present

## 2016-12-23 DIAGNOSIS — R109 Unspecified abdominal pain: Secondary | ICD-10-CM | POA: Diagnosis not present

## 2016-12-23 DIAGNOSIS — R0789 Other chest pain: Secondary | ICD-10-CM

## 2016-12-23 DIAGNOSIS — R279 Unspecified lack of coordination: Secondary | ICD-10-CM | POA: Diagnosis not present

## 2016-12-23 DIAGNOSIS — R079 Chest pain, unspecified: Secondary | ICD-10-CM | POA: Diagnosis not present

## 2016-12-23 DIAGNOSIS — W19XXXA Unspecified fall, initial encounter: Secondary | ICD-10-CM

## 2016-12-23 DIAGNOSIS — Z9889 Other specified postprocedural states: Secondary | ICD-10-CM | POA: Diagnosis not present

## 2016-12-23 DIAGNOSIS — R9082 White matter disease, unspecified: Secondary | ICD-10-CM | POA: Diagnosis not present

## 2016-12-23 DIAGNOSIS — T1490XA Injury, unspecified, initial encounter: Secondary | ICD-10-CM

## 2016-12-23 DIAGNOSIS — I2510 Atherosclerotic heart disease of native coronary artery without angina pectoris: Secondary | ICD-10-CM | POA: Insufficient documentation

## 2016-12-23 DIAGNOSIS — M79645 Pain in left finger(s): Secondary | ICD-10-CM | POA: Diagnosis not present

## 2016-12-23 DIAGNOSIS — S8992XA Unspecified injury of left lower leg, initial encounter: Secondary | ICD-10-CM | POA: Diagnosis not present

## 2016-12-23 DIAGNOSIS — I700 Atherosclerosis of aorta: Secondary | ICD-10-CM | POA: Diagnosis not present

## 2016-12-23 DIAGNOSIS — K5730 Diverticulosis of large intestine without perforation or abscess without bleeding: Secondary | ICD-10-CM | POA: Insufficient documentation

## 2016-12-23 DIAGNOSIS — N281 Cyst of kidney, acquired: Secondary | ICD-10-CM | POA: Diagnosis not present

## 2016-12-23 DIAGNOSIS — S8991XA Unspecified injury of right lower leg, initial encounter: Secondary | ICD-10-CM | POA: Diagnosis not present

## 2016-12-23 DIAGNOSIS — S3991XA Unspecified injury of abdomen, initial encounter: Secondary | ICD-10-CM | POA: Diagnosis not present

## 2016-12-23 DIAGNOSIS — M79646 Pain in unspecified finger(s): Secondary | ICD-10-CM | POA: Diagnosis not present

## 2016-12-23 DIAGNOSIS — M25562 Pain in left knee: Secondary | ICD-10-CM | POA: Diagnosis not present

## 2016-12-23 DIAGNOSIS — S4992XA Unspecified injury of left shoulder and upper arm, initial encounter: Secondary | ICD-10-CM | POA: Diagnosis not present

## 2016-12-23 DIAGNOSIS — Z79899 Other long term (current) drug therapy: Secondary | ICD-10-CM | POA: Diagnosis not present

## 2016-12-23 DIAGNOSIS — Z743 Need for continuous supervision: Secondary | ICD-10-CM | POA: Diagnosis not present

## 2016-12-23 DIAGNOSIS — W19XXXS Unspecified fall, sequela: Secondary | ICD-10-CM

## 2016-12-23 DIAGNOSIS — J9811 Atelectasis: Secondary | ICD-10-CM | POA: Diagnosis not present

## 2016-12-23 DIAGNOSIS — N200 Calculus of kidney: Secondary | ICD-10-CM | POA: Insufficient documentation

## 2016-12-23 DIAGNOSIS — M47812 Spondylosis without myelopathy or radiculopathy, cervical region: Secondary | ICD-10-CM | POA: Diagnosis not present

## 2016-12-23 DIAGNOSIS — W19XXXD Unspecified fall, subsequent encounter: Secondary | ICD-10-CM

## 2016-12-23 DIAGNOSIS — Z7401 Bed confinement status: Secondary | ICD-10-CM | POA: Diagnosis not present

## 2016-12-23 DIAGNOSIS — M25561 Pain in right knee: Secondary | ICD-10-CM | POA: Diagnosis not present

## 2016-12-23 DIAGNOSIS — IMO0001 Reserved for inherently not codable concepts without codable children: Secondary | ICD-10-CM

## 2016-12-23 DIAGNOSIS — W010XXA Fall on same level from slipping, tripping and stumbling without subsequent striking against object, initial encounter: Secondary | ICD-10-CM | POA: Diagnosis not present

## 2016-12-23 DIAGNOSIS — S8002XA Contusion of left knee, initial encounter: Secondary | ICD-10-CM | POA: Diagnosis not present

## 2016-12-23 DIAGNOSIS — S161XXA Strain of muscle, fascia and tendon at neck level, initial encounter: Secondary | ICD-10-CM | POA: Diagnosis not present

## 2016-12-23 DIAGNOSIS — S299XXA Unspecified injury of thorax, initial encounter: Secondary | ICD-10-CM | POA: Diagnosis not present

## 2016-12-23 DIAGNOSIS — M25512 Pain in left shoulder: Secondary | ICD-10-CM | POA: Diagnosis not present

## 2016-12-23 DIAGNOSIS — S6992XA Unspecified injury of left wrist, hand and finger(s), initial encounter: Secondary | ICD-10-CM | POA: Diagnosis not present

## 2016-12-23 DIAGNOSIS — R52 Pain, unspecified: Secondary | ICD-10-CM

## 2016-12-23 MED ORDER — IOPAMIDOL (ISOVUE-300) INJECTION 61%
100.0000 mL | Freq: Once | INTRAVENOUS | Status: AC | PRN
  Administered 2016-12-23: 100 mL via INTRAVENOUS

## 2016-12-23 NOTE — Telephone Encounter (Signed)
RETURNED PHONE Kief.

## 2016-12-29 ENCOUNTER — Telehealth (HOSPITAL_COMMUNITY): Payer: Self-pay | Admitting: *Deleted

## 2016-12-29 NOTE — Telephone Encounter (Signed)
returned phone call to patient.  voice message he left on 12/27/16 was not understood.

## 2016-12-29 NOTE — Telephone Encounter (Signed)
Patient canceled appointment for 01/15/17 due to another appointment.   He said he will call back to schedule.

## 2016-12-29 NOTE — Progress Notes (Signed)
Cardiology Office Note   Date:  12/30/2016   ID:  Zachary [name], DOB 27-Apr-1946, MRN [id number]  PCP:  [name], MD  Cardiologist:   [name], MD   PT referred for coronary calcifications     History of Present Illness: Zachary [name] is a 71 y.o. 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 acetaminophen (TYLENOL) 500 MG tablet Take 500 mg by mouth every 6 (six) hours as needed for mild pain or headache. For pain   . Calcium Carbonate-Vitamin D (TGT CALCIUM DIETARY SUPPLEMENT PO) Take 1 tablet by mouth once a day  every other day  . diazepam (VALIUM) 5 MG tablet Take 1 tablet (5 mg total) by mouth 2 (two) times daily as needed for anxiety.  . DULoxetine (CYMBALTA) 60 MG capsule Take 1 capsule (60 mg total) by mouth daily.  . fluticasone (FLONASE) 50 MCG/ACT nasal spray Place 1 spray into both nostrils daily.  . Magnesium Hydroxide (MILK OF MAGNESIA PO) Milk of magnesia 30 ml by mouth daily as needed for constipation may try fleets enema.  . Multiple Vitamin (MULTIVITAMIN WITH MINERALS) TABS tablet Take 1 tablet by mouth daily.  . sennosides-docusate sodium (SENOKOT-S) 8.6-50 MG tablet Take 1 tablet by mouth at bedtime.  . valsartan-hydrochlorothiazide (DIOVAN-HCT) 320-12.5 MG tablet TK 1 T PO  QD     Allergies:   Sulfonamide derivatives   Past Medical History:  Diagnosis Date  . Anxiety    takes Valium as needed  . Arthritis   . Bladder cancer ([name])    takes Rapaflo daily  . Blood  dyscrasia    07/08/16: pt told he was a "free bleeder" after bleeding a lot when derm cut off mole on forehead. No excessive bleeding with minor wounds at home, no bleeding problems perioperatively with prior surgeries.  . Chronic back pain    spondylolisthesis  . Cluster headaches   . Depression    takes Lexapro daily  . Essential hypertension, benign    takes Diovan-HCT daily  . Joint pain   . Obstructive sleep apnea on CPAP    uses CPAP @ night  . Pneumonia    "several times"--last time about 3-4 yrs ago  . PONV (postoperative nausea and vomiting)    Hx: of "sometimes"  . Prediabetes     Past Surgical History:  Procedure Laterality Date  . ANTERIOR CERVICAL DECOMP/DISCECTOMY FUSION N/A 06/05/2013   Procedure:  C3-4 Anterior Cervical Discectomy and Fusion, Allograft, Plate;  Surgeon: [name], MD;  Location: [facility];  Service: Orthopedics;  Laterality: N/A;  C3-4 Anterior Cervical Discectomy and Fusion, Allograft, Plate  . Arthroscopic knee surgery    . BACK SURGERY      x 2  . BLADDER SURGERY     x 5 to remove tumor  . CARPAL TUNNEL RELEASE    . CATARACT EXTRACTION  [name]  12/19/2012   Procedure: CATARACT EXTRACTION PHACO AND INTRAOCULAR LENS PLACEMENT (IOC);  Surgeon: [name], MD;  Location: [facility];  Service: Ophthalmology;  Laterality: Left;  CDE: 26.80  . CATARACT EXTRACTION W/PHACO  01/09/2013   Procedure: CATARACT EXTRACTION PHACO AND INTRAOCULAR LENS PLACEMENT (IOC);  Surgeon: [name], MD;  Location: [facility];  Service: Ophthalmology;  Laterality: Right;  CDE:22.83  . CERVICAL FUSION  06/05/2013   C 3  C4  . CYSTOSCOPY    . REFRACTIVE SURGERY     Hx: of  . RETINAL DETACHMENT SURGERY    . TRANSURETHRAL RESECTION OF BLADDER TUMOR Right 07/27/2015   Procedure: TRANSURETHRAL RESECTION OF BLADDER TUMOR (TURBT);  Surgeon: [name], MD;  Location: [facility];  Service: Urology;  Laterality: Right;     Social History:  The patient  reports that he quit smoking  about 40 years ago. He has never used smokeless tobacco. He reports that he does not drink alcohol or use drugs.   Family History:  The patient's family history includes Alcohol abuse in his maternal grandfather and mother; Anxiety disorder in his sister; Cancer in his father; Depression in his mother; Hypertension in his father, mother, and sister.    ROS:  Please see the history of present illness. All other systems are reviewed and  Negative to the above problem except as noted.    PHYSICAL EXAM: VS:  BP (!) 158/80 Comment: right arm  160/86  Pulse 85   Ht 6' (1.829 m)   Wt 226 lb (102.5 kg)   SpO2 93%   BMI 30.65 kg/m   GEN: Well nourished, well developed, in no acute distress  HEENT: normal  Neck: no JVD, carotid bruits, or masses Cardiac: RRR; no murmurs, rubs, or gallops,no edema  Respiratory:  clear to auscultation bilaterally, normal work of breathing GI: soft, nontender, nondistended, + BS  No hepatomegaly  MS: no deformity Moving all extremities   Skin: warm and dry, no rash Neuro:  Strength and sensation are intact Psych: euthymic mood, full affect   EKG:  EKG is not ordered today.  IN jUly 2017 SR   RBBB  Possible IWM     Lipid Panel No results found for: CHOL, TRIG, HDL, CHOLHDL, VLDL, LDLCALC, LDLDIRECT    Wt Readings from Last 3 Encounters:  12/30/16 226 lb (102.5 kg)  08/04/16 217 lb 3.2 oz (98.5 kg)  07/24/16 226 lb (102.5 kg)      ASSESSMENT AND PLAN: 1  Coronary calcificatons  Found on CT scan  Pt also has plaquing of carotid arteries I am not impressed with anything being suspicious for ischemia/angina I would not reocmm any stress testing at this point,  Testing only if symptoms change He is fairly active I would recomm carotid USN to eval plaquing  I will get lipids from [name] He should be on a statin   I wll defer ASA to [name] and [name]  Hx of bladder CA  I do not want any problems with hematuria  If risk is low I would start ec ASA 81  mg  2.  LIpds As above  3  HTN  BP is up a little today  I will defer to Z Hall No changes today  F?U as needed if symtpoms change  Will be in touch with pt re results      Current medicines are reviewed at length with the patient today.  The patient does not have concerns regarding  medicines.  Signed, [name], MD  12/30/2016 2:46 PM    [address], [place], [place]  [address] Phone: [phone]; Fax: [phone]

## 2016-12-30 ENCOUNTER — Encounter: Payer: Self-pay | Admitting: *Deleted

## 2016-12-30 ENCOUNTER — Encounter: Payer: Self-pay | Admitting: Internal Medicine

## 2016-12-30 ENCOUNTER — Ambulatory Visit (INDEPENDENT_AMBULATORY_CARE_PROVIDER_SITE_OTHER): Payer: Medicare HMO | Admitting: Internal Medicine

## 2016-12-30 VITALS — BP 158/80 | HR 85 | Ht 72.0 in | Wt 226.0 lb

## 2016-12-30 DIAGNOSIS — I10 Essential (primary) hypertension: Secondary | ICD-10-CM | POA: Diagnosis not present

## 2016-12-30 DIAGNOSIS — I2510 Atherosclerotic heart disease of native coronary artery without angina pectoris: Secondary | ICD-10-CM

## 2016-12-30 NOTE — Patient Instructions (Signed)
Your physician recommends that you schedule a follow-up appointment in: as needed    Your physician has requested that you have a carotid duplex. This test is an ultrasound of the carotid arteries in your neck. It looks at blood flow through these arteries that supply the brain with blood. Allow one hour for this exam. There are no restrictions or special instructions.    Your physician recommends that you continue on your current medications as directed. Please refer to the Current Medication list given to you today.    Thank you for choosing [facility] !

## 2017-01-01 DIAGNOSIS — H31092 Other chorioretinal scars, left eye: Secondary | ICD-10-CM | POA: Diagnosis not present

## 2017-01-01 DIAGNOSIS — H35373 Puckering of macula, bilateral: Secondary | ICD-10-CM | POA: Diagnosis not present

## 2017-01-01 DIAGNOSIS — H26492 Other secondary cataract, left eye: Secondary | ICD-10-CM | POA: Diagnosis not present

## 2017-01-01 DIAGNOSIS — H43392 Other vitreous opacities, left eye: Secondary | ICD-10-CM | POA: Diagnosis not present

## 2017-01-04 ENCOUNTER — Ambulatory Visit (HOSPITAL_COMMUNITY)
Admission: RE | Admit: 2017-01-04 | Discharge: 2017-01-04 | Disposition: A | Discharge status: Home / Self Care | Payer: Medicare HMO | Source: Ambulatory Visit | Attending: Internal Medicine | Admitting: Internal Medicine

## 2017-01-04 DIAGNOSIS — I6523 Occlusion and stenosis of bilateral carotid arteries: Secondary | ICD-10-CM | POA: Diagnosis not present

## 2017-01-04 DIAGNOSIS — I2510 Atherosclerotic heart disease of native coronary artery without angina pectoris: Secondary | ICD-10-CM | POA: Diagnosis not present

## 2017-01-07 DIAGNOSIS — H43392 Other vitreous opacities, left eye: Secondary | ICD-10-CM | POA: Diagnosis not present

## 2017-01-07 DIAGNOSIS — H43312 Vitreous membranes and strands, left eye: Secondary | ICD-10-CM | POA: Diagnosis not present

## 2017-01-12 DIAGNOSIS — M5137 Other intervertebral disc degeneration, lumbosacral region: Secondary | ICD-10-CM | POA: Diagnosis not present

## 2017-01-15 ENCOUNTER — Ambulatory Visit (HOSPITAL_COMMUNITY): Payer: Self-pay | Admitting: Psychiatry

## 2017-01-15 ENCOUNTER — Ambulatory Visit (INDEPENDENT_AMBULATORY_CARE_PROVIDER_SITE_OTHER): Payer: Medicare HMO | Admitting: Diagnostic Neuroimaging

## 2017-01-15 ENCOUNTER — Encounter: Payer: Self-pay | Admitting: Diagnostic Neuroimaging

## 2017-01-15 VITALS — BP 150/86 | HR 84 | Ht 72.0 in | Wt 229.0 lb

## 2017-01-15 DIAGNOSIS — R269 Unspecified abnormalities of gait and mobility: Secondary | ICD-10-CM

## 2017-01-15 DIAGNOSIS — G20 Parkinson's disease: Secondary | ICD-10-CM | POA: Diagnosis not present

## 2017-01-15 DIAGNOSIS — I2510 Atherosclerotic heart disease of native coronary artery without angina pectoris: Secondary | ICD-10-CM

## 2017-01-15 DIAGNOSIS — R413 Other amnesia: Secondary | ICD-10-CM

## 2017-01-15 NOTE — Progress Notes (Signed)
[facility]  PATIENT: Zachary [name] DOB: 12/14/1946  REFERRING CLINICIAN: Merlyn Albert, MD HISTORY FROM: patient and wife REASON FOR VISIT: new consult   HISTORICAL  CHIEF COMPLAINT:  Chief Complaint  Patient presents with  . Memory Loss    rm 7, New Pt, wife- [name], MMSE  27, "memory loss, slight tremor in hands, stumbling a lot, headaches"    HISTORY OF PRESENT ILLNESS:   71 year old male here for evaluation of memory problemsand gait difficulty. Patient reports memory problems for past 1 year. Wife thinks he has been having memory problem for at least 3 years. Sheis forgetting dates, fax, having trouble with comprehension, sleeping more. Also patient having more balance and walking problems. She has noticed that patient is taking small shuffling steps. 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 Following these 2 surgeries he had extensive physical therapy and rehabilitation. Patient attributes his balance problems to his low back surgery problem.  Patient also has history of bladder cancer, hypertension, ringing in ears, depression, anxiety.  Patient has had deterioration handwriting, irregular sleep, soft and hoarse voice. Patient having some intermittent tremor in his hands.   REVIEW OF SYSTEMS: Full 14 system review of systems performed and negative with exception of: memory loss confusion headache slurred speech dizziness tremor sleepiness depression anxiety disinterest in activities racing thoughts aching muscles joint pain snoring on CPAP blurred vision eye pain hearing loss ringing in ears moles.   ALLERGIES: Allergies  Allergen Reactions  . Sulfonamide Derivatives Itching, Rash and Other (See Comments)    whelps    HOME MEDICATIONS: Outpatient Medications Prior to Visit  Medication Sig Dispense Refill  . acetaminophen (TYLENOL) 500 MG tablet Take  500 mg by mouth every 6 (six) hours as needed for mild pain or headache. For pain     . Calcium Carbonate-Vitamin D (TGT CALCIUM DIETARY SUPPLEMENT PO) Take 1 tablet by mouth once a day  every other day    . diazepam (VALIUM) 5 MG tablet Take 1 tablet (5 mg total) by mouth 2 (two) times daily as needed for anxiety. 60 tablet 2  . fluticasone (FLONASE) 50 MCG/ACT nasal spray Place 1 spray into both nostrils daily.    . Magnesium Hydroxide (MILK OF MAGNESIA PO) Milk of magnesia 30 ml by mouth daily as needed for constipation may try fleets enema.    . Multiple Vitamin (MULTIVITAMIN WITH MINERALS) TABS tablet Take 1 tablet by mouth daily.    . sennosides-docusate sodium (SENOKOT-S) 8.6-50 MG tablet Take 1 tablet by mouth at bedtime.    . valsartan-hydrochlorothiazide (DIOVAN-HCT) 320-12.5 MG tablet TK 1 T PO  QD  2  . DULoxetine (CYMBALTA) 60 MG capsule Take 1 capsule (60 mg total) by mouth daily. 30 capsule 2   No facility-administered medications prior to visit.     PAST MEDICAL HISTORY: Past Medical History:  Diagnosis Date  . Anxiety    takes Valium as needed  . Arthritis   . Bladder cancer ([place])    takes Rapaflo daily  . Blood dyscrasia    07/08/16: pt told he was a "free bleeder" after bleeding a lot when derm cut off mole on forehead. No excessive bleeding with minor wounds at home, no bleeding problems perioperatively with prior surgeries.  . Chronic back pain    spondylolisthesis  . Cluster headaches   . Depression    takes Lexapro daily  .  Essential hypertension, benign    takes Diovan-HCT daily  . Joint pain   . Obstructive sleep apnea on CPAP    uses CPAP @ night  . Pneumonia    "several times"--last time about 3-4 yrs ago  . PONV (postoperative nausea and vomiting)    Hx: of "sometimes"  . Prediabetes     PAST SURGICAL HISTORY: Past Surgical History:  Procedure Laterality Date  . ANTERIOR CERVICAL DECOMP/DISCECTOMY FUSION N/A 06/05/2013   Procedure:  C3-4 Anterior  Cervical Discectomy and Fusion, Allograft, Plate;  Surgeon: [name], MD;  Location: [facility];  Service: Orthopedics;  Laterality: N/A;  C3-4 Anterior Cervical Discectomy and Fusion, Allograft, Plate  . Arthroscopic knee surgery    . BACK SURGERY      x 2  . BLADDER SURGERY     x 5 to remove tumor  . CARPAL TUNNEL RELEASE    . CATARACT EXTRACTION W/PHACO  12/19/2012   Procedure: CATARACT EXTRACTION PHACO AND INTRAOCULAR LENS PLACEMENT (IOC);  Surgeon: [name], MD;  [facility];  Service: Ophthalmology;  Laterality: Left;  CDE: 26.80  . CATARACT EXTRACTION W/PHACO  01/09/2013   Procedure: CATARACT EXTRACTION PHACO AND INTRAOCULAR LENS PLACEMENT (IOC);  Surgeon: [name], MD;  Location: [facility];  Service: Ophthalmology;  Laterality: Right;  CDE:22.83  . CERVICAL FUSION  06/05/2013   C 3  C4  . CYSTOSCOPY    . REFRACTIVE SURGERY     Hx: of  . RETINAL DETACHMENT SURGERY    . TRANSURETHRAL RESECTION OF BLADDER TUMOR Right 07/27/2015   Procedure: TRANSURETHRAL RESECTION OF BLADDER TUMOR (TURBT);  Surgeon: [name], MD;  [facility];  Service: Urology;  Laterality: Right;    FAMILY HISTORY: Family History  Problem Relation Age of Onset  . Hypertension Father   . Cancer Father     Prostate  . Depression Mother   . Hypertension Mother   . Alcohol abuse Mother   . Hypertension Sister   . Anxiety disorder Sister   . Alcohol abuse Maternal Grandfather   . ADD / ADHD Neg Hx   . Bipolar disorder Neg Hx   . Dementia Neg Hx   . Drug abuse Neg Hx   . OCD Neg Hx   . Paranoid behavior Neg Hx   . Schizophrenia Neg Hx   . Seizures Neg Hx   . Sexual abuse Neg Hx   . Physical abuse Neg Hx     SOCIAL HISTORY:  Social History   Social History  . Marital status: Married    Spouse name: [name]   . Number of children: 0  . Years of education: 21   Occupational History  . Retired   .  Unemployed   Social History Main Topics  . Smoking status: Former Smoker    Quit  date: 12/30/1976  . Smokeless tobacco: Never Used     Comment: 1985  . Alcohol use No     Comment: quit 1990  . Drug use: No  . Sexual activity: Yes   Other Topics Concern  . Not on file   Social History Narrative   Lives with wife    caffeine- sodas     PHYSICAL EXAM  GENERAL EXAM/CONSTITUTIONAL: Vitals:  Vitals:   01/15/17 1110  BP: (!) 150/86  Pulse: 84  Weight: 229 lb (103.9 kg)  Height: 6' (1.829 m)     Body mass index is 31.06 kg/m.  Visual Acuity Screening   Right eye Left  eye Both eyes  Without correction:     With correction: 20/100 20/50   Comments: Right eye- 2 retina surgeries    Patient is in no distress; well developed, nourished and groomed; neck is supple  FLAT AFFECT, MASKED FACIES  CARDIOVASCULAR:  Examination of carotid arteries is normal; no carotid bruits  Regular rate and rhythm, no murmurs  Examination of peripheral vascular system by observation and palpation is normal  EYES:  Ophthalmoscopic exam of optic discs and posterior segments is normal; no papilledema or hemorrhages  LEFT EYE SUBCONJUNCTIVAL HEMORRHAGE  MUSCULOSKELETAL:  Gait, strength, tone, movements noted in Neurologic exam below  NEUROLOGIC: MENTAL STATUS:  MMSE - Mini Mental State Exam 01/15/2017  Orientation to time 5  Orientation to Place 5  Registration 3  Attention/ Calculation 4  Recall 3  Language- name 2 objects 2  Language- repeat 0  Language- follow 3 step command 3  Language- read & follow direction 1  Write a sentence 1  Copy design 0  Total score 27    awake, alert, oriented to person, place and time  recent and remote memory intact  normal attention and concentration  language fluent, comprehension intact, naming intact,   fund of knowledge appropriate  AFT 17  CRANIAL NERVE:   2nd - no papilledema on fundoscopic exam  2nd, 3rd, 4th, 6th - pupils equal and reactive to light, visual fields full to confrontation, extraocular  muscles intact, no nystagmus  5th - facial sensation symmetric  7th - facial strength symmetric  8th - hearing intact  9th - palate elevates symmetrically, uvula midline  11th - shoulder shrug symmetric  12th - tongue protrusion midline  MOTOR:   normal bulk; full strength in the BUE, BLE  BRADYKINESIA IN LUE AND LLE  MILD TREMOR IN BUE WITH CONTRALATERAL RAM  SENSORY:   normal and symmetric to light touch, temperature, vibration  DECR TEMP IN LEFT HAND  DECR VIB IN LEFT FOOT  BILATERAL FOOT VIB < 5 SEC  COORDINATION:   finger-nose-finger, fine finger movements SLOW  REFLEXES:   deep tendon reflexes TRACE and symmetric  GAIT/STATION:   narrow based gait; STOOPED POSTURE; SHORT STEPS; SHUFFLING GAIT; DECR ARM SWING; DIFF WITH TANDEM; romberg is negative    DIAGNOSTIC DATA (LABS, IMAGING, TESTING) - I reviewed patient records, labs, notes, testing and imaging myself where available.  Lab Results  Component Value Date   WBC 8.1 08/05/2016   HGB 10.6 (L) 08/05/2016   HCT 32.1 (L) 08/05/2016   MCV 92.5 08/05/2016   PLT 228 08/05/2016      Component Value Date/Time   NA 139 08/05/2016 0730   K 4.1 08/05/2016 0730   CL 103 08/05/2016 0730   CO2 27 08/05/2016 0730   GLUCOSE 96 08/05/2016 0730   BUN 14 08/05/2016 0730   CREATININE 0.98 08/05/2016 0730   CALCIUM 8.5 (L) 08/05/2016 0730   PROT 7.8 06/01/2013 1521   ALBUMIN 3.0 (L) 07/16/2016 0400   AST 16 06/01/2013 1521   ALT 21 06/01/2013 1521   ALKPHOS 110 06/01/2013 1521   BILITOT 0.2 (L) 06/01/2013 1521   GFRNONAA >60 08/05/2016 0730   GFRAA >60 08/05/2016 0730   No results found for: CHOL, HDL, LDLCALC, LDLDIRECT, TRIG, CHOLHDL Lab Results  Component Value Date   HGBA1C 5.8 (H) 07/22/2016   No results found for: VITAMINB12 No results found for: TSH    01/28/12 MRI brain  - Nonspecific slightly atypical white matter type changes as noted  above. - No internal auditory canal or skull base  abnormality is noted.  01/28/12 MRA head - Intracranial atherosclerotic type changes predominant involving branch vessels as detailed above.  01/28/12 MRV head - Loss of signal of the left transverse sinus probably is related to artifact rather than true stenosis. Overall, the major dural sinuses appear patent.   05/09/13 MRI cervical [I reviewed images myself and agree with interpretation. -VRP]  - Multilevel cervical spondylosis detailed above.  Progressive C3-C4 degenerative disc disease with flattening of the cervical cord. Moderate to severe central stenosis with 7 mm AP diameter of the thecal sac.  No cord edema.  C3-C4 further collapse of the disc space and the extrusion appears slightly larger than on the prior exam.  Other levels appear similar with multilevel mild central stenosis and bilateral foraminal stenosis.  In this patient with left upper extremity radiculopathy, the left-sided foraminal stenosis is most pronounced at C6-C7, potentially affecting the left C7 nerve.  06/12/16 MRI lumbar spine [I reviewed images myself and agree with interpretation. -VRP]  - Severe stenosis at L3-4 is multifactorial, related to central protrusion, posterior element hypertrophy and short pedicles affecting both L4 nerve roots. - 2 mm slip L4-5 associated with a central and leftward protrusion also with posterior element hypertrophy and short pedicles. Moderate to severe stenosis along with LEFT greater than RIGHT L5 nerve root impingement.  - No recurrent leftward protrusion at the L5-S1 level, however a central and rightward protrusion at L5-S1 could affect the RIGHT S1 nerve root.  12/23/16 CT head [I reviewed images myself and agree with interpretation. -VRP]  - Mild chronic ischemic white matter disease. No acute intracranial abnormality seen.  12/23/16 CT cervical [I reviewed images myself and agree with interpretation. -VRP]  - Postsurgical and degenerative changes as described above. No acute  abnormality seen in the cervical spine.     ASSESSMENT AND PLAN  71 y.o. year old male here with progressive memory problems, short-term recall issues, cognitive difficulty, balance and walking problems, tremor. Also with signs and symptoms of parkinsonism. Other contributing factors could include depression, chronic pain, degenerative spine disease, aging and deconditioning.   Ddx memory loss: depression, chronic pain, life stress, neurodegenerative  Ddx balance difficulty: parkinsonism, cervical myelopathy, lumbar radiculopathy  1. Parkinsonism, unspecified Parkinsonism type ([place])   2. Memory loss   3. Gait difficulty      PLAN: I spent 60 minutes of face to face time with patient. Greater than 50% of time was spent in counseling and coordination of care with patient. In summary we discussed:  - check MRI brain and cervical spine - refer to PT evaluation - consider rollator walker - continue depression treatments - may consider parkinson's medications (carb/levo) at next visit - may consider dementia medications (donepezil or memantine) at next visit  Orders Placed This Encounter  Procedures  . MR BRAIN WO CONTRAST  . MR CERVICAL SPINE WO CONTRAST  . Ambulatory referral to Physical Therapy   Return in about 6 weeks (around 02/26/2017).    Penni Bombard, MD [id number], [id number] AM Certified in Neurology, Neurophysiology and Neuroimaging  [facility] [address] [place], [place] [address] [phone]

## 2017-01-18 ENCOUNTER — Telehealth: Payer: Self-pay | Admitting: Diagnostic Neuroimaging

## 2017-01-18 NOTE — Telephone Encounter (Signed)
I just got off the phone with [place] because he wanted his MRI at [facility] and I scheduled his MRI for Monday 01/25/17. I told him that it was for the MRI Brain & MRI Cervical. He informed me that he did not understand why he was having the MRI Cervical spine because he is under the care of Dr. Saintclair Halsted. He gave me Dr. Saintclair Halsted phone number [phone] and Elmyra Ricks his is nurse and her extension is [phone].

## 2017-01-18 NOTE — Telephone Encounter (Signed)
completed

## 2017-01-18 NOTE — Telephone Encounter (Signed)
LVM requesting patient call back to discuss previous phone call.

## 2017-01-18 NOTE — Telephone Encounter (Signed)
Patient is returning your call to discuss MRI differences.

## 2017-01-18 NOTE — Telephone Encounter (Signed)
Pt called RN back, he said his battery died and call was disconnected. He said MRI is now at GI for open MRI. He is requesting to call him back at [phone]. He did say he will call Dr Weston Settle to advise of the upcoming MRI.

## 2017-01-18 NOTE — Telephone Encounter (Signed)
Called patient and discussed. He stated he wanted to know why he needed cervical spine MRI and wanted to be sure Dr Hulen Shouts was aware of MRIs. He stated he had just spoken the Dr Windy Carina assistant and made her aware. This RN advised that since Dr Frederica Kuster is in [facility] system, he can see MRI results. Patient stated that he changed MRI location to GI so that he would be in open MRI. This RN explained that Dr Leta Baptist based his MRI orders on patient/'s symptoms, complaints and problems to further determine, diagnose what is causing his issues. Patient stated he "mostly wanted to be sure Dr Saintclair Halsted was aware". He verbalized understanding, appreciation of call.

## 2017-01-23 ENCOUNTER — Ambulatory Visit
Admission: RE | Admit: 2017-01-23 | Discharge: 2017-01-23 | Disposition: A | Discharge status: Home / Self Care | Payer: Medicare HMO | Source: Ambulatory Visit | Attending: Diagnostic Neuroimaging | Admitting: Diagnostic Neuroimaging

## 2017-01-23 DIAGNOSIS — G20 Parkinson's disease: Secondary | ICD-10-CM

## 2017-01-23 DIAGNOSIS — M4802 Spinal stenosis, cervical region: Secondary | ICD-10-CM | POA: Diagnosis not present

## 2017-01-25 ENCOUNTER — Ambulatory Visit (HOSPITAL_COMMUNITY): Payer: Medicare HMO

## 2017-01-26 ENCOUNTER — Telehealth: Payer: Self-pay | Admitting: Diagnostic Neuroimaging

## 2017-01-26 NOTE — Telephone Encounter (Signed)
Patient is calling to get MRI results. °

## 2017-01-27 ENCOUNTER — Ambulatory Visit (HOSPITAL_COMMUNITY): Payer: Medicare HMO | Attending: Diagnostic Neuroimaging | Admitting: Physical Therapy

## 2017-01-27 DIAGNOSIS — R2689 Other abnormalities of gait and mobility: Secondary | ICD-10-CM | POA: Diagnosis not present

## 2017-01-27 DIAGNOSIS — R262 Difficulty in walking, not elsewhere classified: Secondary | ICD-10-CM | POA: Diagnosis not present

## 2017-01-27 DIAGNOSIS — M6281 Muscle weakness (generalized): Secondary | ICD-10-CM | POA: Diagnosis not present

## 2017-01-27 DIAGNOSIS — R2681 Unsteadiness on feet: Secondary | ICD-10-CM | POA: Insufficient documentation

## 2017-01-27 NOTE — Therapy (Signed)
[place], [place], [address] Phone: [phone]   Fax:  [phone]  Physical Therapy Evaluation  Patient Details  Name: Zachary [name] MRN: [id number] Date of Birth: 1946/09/28 Referring Provider: Penni Bombard  Encounter Date: 01/27/2017      PT End of Session - 01/27/17 1523    Visit Number 1   Number of Visits 13   Date for PT Re-Evaluation 02/17/17   Authorization Type [employer] H[employer]    Authorization Time Period 01/27/17 to 03/10/17   Authorization - Visit Number 1   Authorization - Number of Visits 10   PT Start Time [id number]   PT Stop Time 1516   PT Time Calculation (min) 44 min   Activity Tolerance Patient tolerated treatment well   Behavior During Therapy [facility] for tasks assessed/performed      Past Medical History:  Diagnosis Date  . Anxiety    takes Valium as needed  . Arthritis   . Bladder cancer ([facility])    takes Rapaflo daily  . Blood dyscrasia    07/08/16: pt told he was a "free bleeder" after bleeding a lot when derm cut off mole on forehead. No excessive bleeding with minor wounds at home, no bleeding problems perioperatively with prior surgeries.  . Chronic back pain    spondylolisthesis  . Cluster headaches   . Depression    takes Lexapro daily  . Essential hypertension, benign    takes Diovan-HCT daily  . Joint pain   . Obstructive sleep apnea on CPAP    uses CPAP @ night  . Pneumonia    "several times"--last time about 3-4 yrs ago  . PONV (postoperative nausea and vomiting)    Hx: of "sometimes"  . Prediabetes     Past Surgical History:  Procedure Laterality Date  . ANTERIOR CERVICAL DECOMP/DISCECTOMY FUSION N/A 06/05/2013   Procedure:  C3-4 Anterior Cervical Discectomy and Fusion, Allograft, Plate;  Surgeon: [name], MD;  Location: [place];  Service: Orthopedics;  Laterality: N/A;  C3-4 Anterior Cervical Discectomy and Fusion, Allograft, Plate  . Arthroscopic knee  surgery    . BACK SURGERY      x 2  . BLADDER SURGERY     x 5 to remove tumor  . CARPAL TUNNEL RELEASE    . CATARACT EXTRACTION W/PHACO  12/19/2012   Procedure: CATARACT EXTRACTION PHACO AND INTRAOCULAR LENS PLACEMENT (IOC);  Surgeon: [name], MD;  [facility];  Service: Ophthalmology;  Laterality: Left;  CDE: 26.80  . CATARACT EXTRACTION W/PHACO  01/09/2013   Procedure: CATARACT EXTRACTION PHACO AND INTRAOCULAR LENS PLACEMENT (IOC);  Surgeon: [name], MD;  Location: [facility];  Service: Ophthalmology;  Laterality: Right;  CDE:22.83  . CERVICAL FUSION  06/05/2013   C 3  C4  . CYSTOSCOPY    . REFRACTIVE SURGERY     Hx: of  . RETINAL DETACHMENT SURGERY    . TRANSURETHRAL RESECTION OF BLADDER TUMOR Right 07/27/2015   Procedure: TRANSURETHRAL RESECTION OF BLADDER TUMOR (TURBT);  Surgeon: [name], MD;  [facility];  Service: Urology;  Laterality: Right;    There were no vitals filed for this visit.       Subjective Assessment - 01/27/17 1438    Subjective Patient reports that when he had back surgery on July 18th 2017, everything just seemed to go downhill; he has continued to have quite a bit of pain in his back and going down his  legs. He has had a couple of falls since surgery. He reporrts that recently his wife noticed tremors in his hands and she talked him into going to get checked out; he was diagnosed with Parkinsonism by Dr. Leta Baptist just last week. He has noticed some episodes where "my legs just don't want to go, they are jelly like", this happens maybe 2-3 times per week. He cannot identify any specific activities that triggers this. His left knee has been "picked on all my life"; it has had multiple operations.    Pertinent History HTN, postural hypotension, dysphagia, CNS dysfunction, knee OA, HNP, spinal stenosis, history of bladder cancer, cervical fusion, history of multiple back surgeries    Patient Stated Goals walk better, prevent falls, get back to  PLOF    Currently in Pain? No/denies  but can get to 7-8/10 when he gets up on his feet             [facility] PT Assessment - 01/27/17 0001      Assessment   Medical Diagnosis parkinsonism    Referring Provider Earlean Polka Penumalli   Onset Date/Surgical Date 01/15/17  approximate    Next MD Visit Dr. Leta Baptist in April    Prior Therapy PT post-back surgery at this clinic, none for Parkinsonism      Balance Screen   Has the patient fallen in the past 6 months Yes   How many times? 3   Has the patient had a decrease in activity level because of a fear of falling?  Yes   Is the patient reluctant to leave their home because of a fear of falling?  No     Prior Function   Level of Independence Independent;Independent with basic ADLs;Independent with transfers;Independent with gait   Vocation Retired   [employer] helps with highschool football team      Observation/Other Assessments   Observations mild RAM deficits; proprioception intact; reports dizziness but unable to trigger with eye movements      Strength   Right Hip Flexion 3/5   Right Hip ABduction 4/5   Left Hip Flexion 3/5   Left Hip ABduction 4+/5   Right Knee Flexion 4+/5   Right Knee Extension 4+/5   Left Knee Flexion 4/5   Left Knee Extension 4+/5   Right Ankle Dorsiflexion 4+/5   Left Ankle Dorsiflexion 5/5     Ambulation/Gait   Gait Comments flexed at hips, reduced ankle DF during gait, mild shuffling during corners, reduced gait speed, proximal weakness, drifting during gait, mild scissoring; retrogait shows adequate step lengths but increased scissoring of LEs      6 minute walk test results    Aerobic Endurance Distance Walked 445   Endurance additional comments 3MWT      High Level Balance   High Level Balance Comments TUG regular 16.38, dual tasking TUG 18.2; 4 square step at self selected pace, [facility] but 1 minor possible freezing episode with directional change                             PT Education - 01/27/17 1522    Education provided Yes   Education Details prognosis, POC; will assign HEP next session due to high pain in back, do not want to aggravate with HEP and need to figure out modifications that will not flare pain. Possible causes of parkinsons as well as importance of regular aerobic exercise in managing disease.  Person(s) Educated Patient   Methods Explanation   Comprehension Verbalized understanding;Need further instruction          PT Short Term Goals - 01/27/17 1529      PT SHORT TERM GOAL #1   Title Patient to be able to identify 5/5 safety precautions/concepts in order to assist in reducing falls and improving general safety with mobility    Time 3   Period Weeks   Status New     PT SHORT TERM GOAL #2   Title Patient to show improved gait mechanics including increased BOS with gait, improved toe clearance, elimination of drift, improved heel-toe mechancis, and improved balance in order to reduce fall risk and improve efficiency of mobility    Time 3   Period Weeks   Status New     PT SHORT TERM GOAL #3   Title Patient to be able to perform regular TUG in 12 seconds or less and dual tasking TUG in 14 seconds or less in order to show improved balance and general mobiltiy    Time 3   Period Weeks   Status New     PT SHORT TERM GOAL #4   Title Patient to be able to perform PWR based HEP without pain exacerbation correctly and consistently in order to improve self-efficacy in managing condition    Time 2   Period Weeks   Status New           PT Long Term Goals - 01/27/17 1532      PT LONG TERM GOAL #1   Title Patient to demonstrate strength 5/5 in all tested muscle groups in order to facilitate improved mobility within community    Time 6   Period Weeks   Status New     PT LONG TERM GOAL #2   Title Patient to be able to ambulate 689ft during 3MWT in order to show improved efficiency of  mobiltiy and reduced fall risk    Time 6   Period Weeks   Status New     PT LONG TERM GOAL #3   Title Patient to score at least 21/24 on DGI in order to show improved balance in dynamic scenarios and overall reduced fall risk    Time 6   Period Weeks   Status New     PT LONG TERM GOAL #4   Title Patient to be able to verbalize the importance of regular aerobic activity in managing Parkinsons Disease and will be participatory in regular aerobic exercise at least 20-30 minutes in duration and 4 days per week in order to assist in managing condition moving forward    Time 6   Period Weeks   Status New               Plan - 01/27/17 1524    Clinical Impression Statement Patient arrives reporting that he was very recently been diagnosed with Parkinsonism by his MD; he reports that he is continuing to have ongoing back pain secondary to surgery that was performed in summer of 2017, and he also reports a sharp decline in function- "everything just feels like it is so slow when I try to do it". Examination reveals significant gait deficit, significant functional unsteadiness, postural deficits, general bradykinesia with functional movements, mild functional weakness, and reduced functional activity tolerance; patient presents generally as a moderate fall risk at this time. Noted mild RAM impairment with testing, however proprioception appears intact; noted possible mild freezing episode with directional changes  in 4 square step test. Recommend skilled PT services to address functional limitations, reduce fall risk, and assist in reaching optimal level of function.    Rehab Potential Good   Clinical Impairments Affecting Rehab Potential (+) has had reasonable success with PT in the past, quickly referred to PT after diagnosis of Parkinsonism; (-) ongoing severe back pain, history of falling    PT Frequency 2x / week   PT Duration 6 weeks   PT Treatment/Interventions ADLs/Self Care Home  Management;Biofeedback;DME Instruction;Gait training;Stair training;Functional mobility training;Therapeutic activities;Therapeutic exercise;Balance training;Neuromuscular re-education;Patient/family education;Manual techniques;Passive range of motion;Energy conservation;Taping   PT Next Visit Plan begin PWR training, modify PRN for back pain; assign painfree PWR HEP. Nustep.    Consulted and Agree with Plan of Care Patient      Patient will benefit from skilled therapeutic intervention in order to improve the following deficits and impairments:  Abnormal gait, Improper body mechanics, Pain, Decreased coordination, Decreased mobility, Postural dysfunction, Decreased activity tolerance, Decreased strength, Decreased balance, Decreased safety awareness, Difficulty walking, Impaired flexibility  Visit Diagnosis: Unsteadiness on feet - Plan: PT plan of care cert/re-cert  Other abnormalities of gait and mobility - Plan: PT plan of care cert/re-cert  Difficulty in walking, not elsewhere classified - Plan: PT plan of care cert/re-cert  Muscle weakness (generalized) - Plan: PT plan of care cert/re-cert      G-Codes - 0000000 1534    Functional Assessment Tool Used Based on skilled clinical assessment of gait, strength, balance, freezing episodes, bradykinesia, fall risk    Functional Limitation Mobility: Walking and moving around   Mobility: Walking and Moving Around Current Status [id number]) At least 40 percent but less than 60 percent impaired, limited or restricted   Mobility: Walking and Moving Around Goal Status [phone]) At least 20 percent but less than 40 percent impaired, limited or restricted       Problem List Patient Active Problem List   Diagnosis Date Noted  . Postural hypotension 07/23/2016  . Dysphagia 07/23/2016  . Obstructive sleep apnea 07/21/2016  . Hyperglycemia 07/21/2016  . Anemia, unspecified 07/21/2016  . Spinal stenosis of lumbar region 07/15/2016  . Bladder tumor  07/27/2015  . HNP (herniated nucleus pulposus), cervical 06/05/2013    Class: Diagnosis of  . 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  . Muscle spasms of neck 04/04/2013  . 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 OA (osteoarthritis) of knee 02/22/2013  . Iliotibial band syndrome of left side 11/22/2012  . Localized, primary osteoarthritis of the ankle and foot 10/11/2012  . Difficulty in walking(719.7) 08/17/2012  . Peroneal tendinitis 08/16/2012  . Precordial pain 02/09/2012  . Essential hypertension, benign 02/09/2012  . Mixed hyperlipidemia 02/09/2012  . Dysthymia 02/04/2012  . Abnormality of gait 12/23/2011  . Lateral epicondylitis/tennis elbow 05/06/2011  . TENOSYNOVITIS OF FOOT AND ANKLE 10/22/2010    Deniece Ree PT, DPT [place] [address] [place], [place], [address] Phone: [phone]   Fax:  [phone]  Name: JIM MYNHIER MRN: [id number] Date of Birth: 1946-09-13

## 2017-01-28 ENCOUNTER — Ambulatory Visit (HOSPITAL_COMMUNITY): Payer: Medicare HMO | Attending: Diagnostic Neuroimaging | Admitting: Physical Therapy

## 2017-01-28 DIAGNOSIS — R2681 Unsteadiness on feet: Secondary | ICD-10-CM | POA: Diagnosis not present

## 2017-01-28 DIAGNOSIS — R262 Difficulty in walking, not elsewhere classified: Secondary | ICD-10-CM | POA: Diagnosis not present

## 2017-01-28 DIAGNOSIS — M6281 Muscle weakness (generalized): Secondary | ICD-10-CM | POA: Diagnosis not present

## 2017-01-28 DIAGNOSIS — R2689 Other abnormalities of gait and mobility: Secondary | ICD-10-CM | POA: Diagnosis not present

## 2017-01-28 DIAGNOSIS — R29898 Other symptoms and signs involving the musculoskeletal system: Secondary | ICD-10-CM | POA: Insufficient documentation

## 2017-01-28 NOTE — Therapy (Signed)
[place], [place], [address] Phone: [phone]   Fax:  [phone]  Physical Therapy Treatment  Patient Details  Name: Zachary [name] MRN: [id number] Date of Birth: 1946-09-04 Referring Provider: Penni Bombard  Encounter Date: 01/28/2017      PT End of Session - 01/28/17 1533    Visit Number 2   Number of Visits 13   Date for PT Re-Evaluation 02/17/17   Authorization Type [employer] H[employer]    Authorization Time Period 01/27/17 to 03/10/17   Authorization - Visit Number 2   Authorization - Number of Visits 10   PT Start Time [id number]  started on Nustep, not included in billing    PT Stop Time 1428   PT Time Calculation (min) 33 min   Activity Tolerance Patient tolerated treatment well   Behavior During Therapy [facility] for tasks assessed/performed      Past Medical History:  Diagnosis Date  . Anxiety    takes Valium as needed  . Arthritis   . Bladder cancer ([place])    takes Rapaflo daily  . Blood dyscrasia    07/08/16: pt told he was a "free bleeder" after bleeding a lot when derm cut off mole on forehead. No excessive bleeding with minor wounds at home, no bleeding problems perioperatively with prior surgeries.  . Chronic back pain    spondylolisthesis  . Cluster headaches   . Depression    takes Lexapro daily  . Essential hypertension, benign    takes Diovan-HCT daily  . Joint pain   . Obstructive sleep apnea on CPAP    uses CPAP @ night  . Pneumonia    "several times"--last time about 3-4 yrs ago  . PONV (postoperative nausea and vomiting)    Hx: of "sometimes"  . Prediabetes     Past Surgical History:  Procedure Laterality Date  . ANTERIOR CERVICAL DECOMP/DISCECTOMY FUSION N/A 06/05/2013   Procedure:  C3-4 Anterior Cervical Discectomy and Fusion, Allograft, Plate;  Surgeon: [name], MD;  Location: [place];  Service: Orthopedics;  Laterality: N/A;  C3-4 Anterior Cervical Discectomy and Fusion,  Allograft, Plate  . Arthroscopic knee surgery    . BACK SURGERY      x 2  . BLADDER SURGERY     x 5 to remove tumor  . CARPAL TUNNEL RELEASE    . CATARACT EXTRACTION W/PHACO  12/19/2012   Procedure: CATARACT EXTRACTION PHACO AND INTRAOCULAR LENS PLACEMENT (IOC);  Surgeon: [name], MD;  [facility];  Service: Ophthalmology;  Laterality: Left;  CDE: 26.80  . CATARACT EXTRACTION W/PHACO  01/09/2013   Procedure: CATARACT EXTRACTION PHACO AND INTRAOCULAR LENS PLACEMENT (IOC);  Surgeon: [name], MD;  Location: [facility];  Service: Ophthalmology;  Laterality: Right;  CDE:22.83  . CERVICAL FUSION  06/05/2013   C 3  C4  . CYSTOSCOPY    . REFRACTIVE SURGERY     Hx: of  . RETINAL DETACHMENT SURGERY    . TRANSURETHRAL RESECTION OF BLADDER TUMOR Right 07/27/2015   Procedure: TRANSURETHRAL RESECTION OF BLADDER TUMOR (TURBT);  Surgeon: [name], MD;  [facility];  Service: Urology;  Laterality: Right;    There were no vitals filed for this visit.      Subjective Assessment - 01/28/17 1354    Subjective Patient arrives today stating no major changes since yesterday, doing well otherwise but still having pain    Pertinent History HTN, postural hypotension, dysphagia, CNS dysfunction,  knee OA, HNP, spinal stenosis, history of bladder cancer, cervical fusion, history of multiple back surgeries    Patient Stated Goals walk better, prevent falls, get back to PLOF    Currently in Pain? No/denies                            PWR [facility]) - 01/28/17 1412    PWR! Up 1x10 with ball/cone   PWR! Rock 1x8 each side, targets, seated    PWR! Twist 1x5 each with cones, 1x5 each claps; seated, cones    PWR! Step 1x10 each side, hurdles, cones, seated    Comments seated, hurdles, cone              PT Education - 01/28/17 1532    Education provided Yes   Education Details reviewed initial eval and goals; seated PWR form and importance of correct form with exercise;  importance of regular aerobic activitiise that he enjoys, such as swimming, to help manage parkinsonian symptoms    Person(s) Educated Patient   Methods Explanation;Handout   Comprehension Verbalized understanding          PT Short Term Goals - 01/27/17 1529      PT SHORT TERM GOAL #1   Title Patient to be able to identify 5/5 safety precautions/concepts in order to assist in reducing falls and improving general safety with mobility    Time 3   Period Weeks   Status New     PT SHORT TERM GOAL #2   Title Patient to show improved gait mechanics including increased BOS with gait, improved toe clearance, elimination of drift, improved heel-toe mechancis, and improved balance in order to reduce fall risk and improve efficiency of mobility    Time 3   Period Weeks   Status New     PT SHORT TERM GOAL #3   Title Patient to be able to perform regular TUG in 12 seconds or less and dual tasking TUG in 14 seconds or less in order to show improved balance and general mobiltiy    Time 3   Period Weeks   Status New     PT SHORT TERM GOAL #4   Title Patient to be able to perform PWR based HEP without pain exacerbation correctly and consistently in order to improve self-efficacy in managing condition    Time 2   Period Weeks   Status New           PT Long Term Goals - 01/27/17 1532      PT LONG TERM GOAL #1   Title Patient to demonstrate strength 5/5 in all tested muscle groups in order to facilitate improved mobility within community    Time 6   Period Weeks   Status New     PT LONG TERM GOAL #2   Title Patient to be able to ambulate 660ft during 3MWT in order to show improved efficiency of mobiltiy and reduced fall risk    Time 6   Period Weeks   Status New     PT LONG TERM GOAL #3   Title Patient to score at least 21/24 on DGI in order to show improved balance in dynamic scenarios and overall reduced fall risk    Time 6   Period Weeks   Status New     PT LONG TERM GOAL  #4   Title Patient to be able to verbalize the importance of regular aerobic activity in  managing Parkinsons Disease and will be participatory in regular aerobic exercise at least 20-30 minutes in duration and 4 days per week in order to assist in managing condition moving forward    Time 6   Period Weeks   Status New               Plan - 01/28/17 1534    Clinical Impression Statement Patient arrives today reporting no major changes since evaluation yesterday. Began session with non-billed time on Nustep for reciprocal gait training/LE strengthening this session, then proceeded to introduce seated PWR exercises this session with min cues for form and monitoring regarding pain related to pre-existing low back pathology. Patient able to tolerate all seated PWR activities well this session; assigned seated PWR exercises as HEP today, reviewed initial eval/goals after completing quick disclosure.    Rehab Potential Good   Clinical Impairments Affecting Rehab Potential (+) has had reasonable success with PT in the past, quickly referred to PT after diagnosis of Parkinsonism; (-) ongoing severe back pain, history of falling    PT Frequency 2x / week   PT Duration 6 weeks   PT Treatment/Interventions ADLs/Self Care Home Management;Biofeedback;DME Instruction;Gait training;Stair training;Functional mobility training;Therapeutic activities;Therapeutic exercise;Balance training;Neuromuscular re-education;Patient/family education;Manual techniques;Passive range of motion;Energy conservation;Taping   PT Next Visit Plan seated and standing PWR training; gait and balance training, trial treadmill    Consulted and Agree with Plan of Care Patient      Patient will benefit from skilled therapeutic intervention in order to improve the following deficits and impairments:  Abnormal gait, Improper body mechanics, Pain, Decreased coordination, Decreased mobility, Postural dysfunction, Decreased activity  tolerance, Decreased strength, Decreased balance, Decreased safety awareness, Difficulty walking, Impaired flexibility  Visit Diagnosis: Unsteadiness on feet  Other abnormalities of gait and mobility  Difficulty in walking, not elsewhere classified  Muscle weakness (generalized)  Other symptoms and signs involving the musculoskeletal system       G-Codes - 02-19-2017 1534    Functional Assessment Tool Used Based on skilled clinical assessment of gait, strength, balance, freezing episodes, bradykinesia, fall risk    Functional Limitation Mobility: Walking and moving around   Mobility: Walking and Moving Around Current Status [phone]) At least 40 percent but less than 60 percent impaired, limited or restricted   Mobility: Walking and Moving Around Goal Status [phone]) At least 20 percent but less than 40 percent impaired, limited or restricted      Problem List Patient Active Problem List   Diagnosis Date Noted  . Postural hypotension 07/23/2016  . Dysphagia 07/23/2016  . Obstructive sleep apnea 07/21/2016  . Hyperglycemia 07/21/2016  . Anemia, unspecified 07/21/2016  . Spinal stenosis of lumbar region 07/15/2016  . Bladder tumor 07/27/2015  . HNP (herniated nucleus pulposus), cervical 06/05/2013    Class: Diagnosis of  . 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  . Muscle spasms of neck 04/04/2013  . 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 OA (osteoarthritis) of knee 02/22/2013  . Iliotibial band syndrome of left side 11/22/2012  . Localized, primary osteoarthritis of the ankle and foot 10/11/2012  . Difficulty in walking(719.7) 08/17/2012  . Peroneal tendinitis 08/16/2012  . Precordial pain 02/09/2012  . Essential hypertension, benign 02/09/2012  . Mixed hyperlipidemia 02/09/2012  . Dysthymia 02/04/2012  . Abnormality of gait 12/23/2011  . Lateral epicondylitis/tennis elbow 05/06/2011  . TENOSYNOVITIS OF FOOT AND ANKLE 10/22/2010    Deniece Ree PT,  DPT [place]  [place], [place], [address] Phone: [phone]   Fax:  [phone]  Name: Zachary [name] MRN: [id number] Date of Birth: [birth year]

## 2017-01-28 NOTE — Telephone Encounter (Signed)
I called patient with MRI results. Brain shows atrophy and chronic small vessel ischemic disease. Cervical spine shows post-op changes, mild spinal stenosis at C4-5, and foraminal stenosis. Recommend to continue current plan of PT and monitoring. -VRP

## 2017-02-01 ENCOUNTER — Telehealth: Payer: Self-pay | Admitting: Diagnostic Neuroimaging

## 2017-02-01 ENCOUNTER — Telehealth (HOSPITAL_COMMUNITY): Payer: Self-pay | Admitting: Internal Medicine

## 2017-02-01 ENCOUNTER — Telehealth (HOSPITAL_COMMUNITY): Payer: Self-pay | Admitting: *Deleted

## 2017-02-01 ENCOUNTER — Ambulatory Visit (INDEPENDENT_AMBULATORY_CARE_PROVIDER_SITE_OTHER): Payer: Medicare HMO | Admitting: Psychiatry

## 2017-02-01 VITALS — Ht 72.0 in

## 2017-02-01 DIAGNOSIS — Z811 Family history of alcohol abuse and dependence: Secondary | ICD-10-CM

## 2017-02-01 DIAGNOSIS — F331 Major depressive disorder, recurrent, moderate: Secondary | ICD-10-CM | POA: Diagnosis not present

## 2017-02-01 DIAGNOSIS — Z9842 Cataract extraction status, left eye: Secondary | ICD-10-CM

## 2017-02-01 DIAGNOSIS — Z9889 Other specified postprocedural states: Secondary | ICD-10-CM | POA: Diagnosis not present

## 2017-02-01 DIAGNOSIS — Z9841 Cataract extraction status, right eye: Secondary | ICD-10-CM

## 2017-02-01 DIAGNOSIS — Z818 Family history of other mental and behavioral disorders: Secondary | ICD-10-CM

## 2017-02-01 DIAGNOSIS — Z808 Family history of malignant neoplasm of other organs or systems: Secondary | ICD-10-CM

## 2017-02-01 DIAGNOSIS — Z882 Allergy status to sulfonamides status: Secondary | ICD-10-CM | POA: Diagnosis not present

## 2017-02-01 DIAGNOSIS — Z8249 Family history of ischemic heart disease and other diseases of the circulatory system: Secondary | ICD-10-CM

## 2017-02-01 MED ORDER — DIAZEPAM 5 MG PO TABS: 5.0000 mg | ORAL_TABLET | Freq: Two times a day (BID) | ORAL | 5 refills | Status: DC | PRN

## 2017-02-01 MED ORDER — ESCITALOPRAM OXALATE 20 MG PO TABS: 20.0000 mg | ORAL_TABLET | Freq: Every day | ORAL | 5 refills | Status: DC

## 2017-02-01 NOTE — Telephone Encounter (Signed)
Patient calling to discuss whether or not to take diazepam (VALIUM) 5 MG tablet.

## 2017-02-01 NOTE — Progress Notes (Signed)
Patient ID: Zachary [name], male   DOB: 1946-06-19, 71 y.o.   MRN: [id number] Patient ID: Zachary [name], male   DOB: November 25, 1946, 71 y.o.   MRN: [id number] Patient ID: Zachary [name], male   DOB: 26-Feb-1946, 71 y.o.   MRN: [id number] Patient ID: Zachary [name], male   DOB: 1946-09-27, 71 y.o.   MRN: [id number] Patient ID: Zachary [name], male   DOB: 04/01/1946, 71 y.o.   MRN: [id number] Patient ID: Zachary [name], male   DOB: [birth year], 71 y.o.   MRN: [id number] Patient ID: Zachary [name], male   DOB: [birth year], 71 y.o.   MRN: [id number] Patient ID: Zachary [name], male   DOB: 03-30-1946, 71 y.o.   MRN: [id number] Patient ID: Zachary [name], male   DOB: 12/02/1946, 71 y.o.   MRN: [id number] Patient ID: Zachary [name], male   DOB: June 14, 1946, 71 y.o.   MRN: [id number] Patient ID: Zachary [name], male   DOB: Jul 21, 1946, 71 y.o.   MRN: [id number] [facility] [id number] Progress Note Zachary [name] MRN: [id number] DOB: [birth year] Age: 71 y.o.  Date: 02/01/2017 Start Time: 3:10 PM End Time: 3:33 PM  Chief Complaint: Chief Complaint  Patient presents with  . Follow-up    per pt he need to talk about the Valium  . Depression  . Anxiety   Subjective: "My wife is back."  This patient is a 41 year-old married white male who lives with his wife in [place] they have no children. 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  The patient states he got depressed several years ago after his diagnosed with stage IV bladder cancer. He was seeing a psychologist here to deal with this and last year was placed on Lexapro. His cancer is gone now after 5 surgeries. He recently had back surgery as well but is recovered from this with no pain. He states that his mood is been great. He's excited about the football season. He is sleeping well and his energy is good  The patient returns after 3  months. He and his wife are back together. She is very adamant that he get off the Valium and possibly the Lexapro. He's been recently diagnosed with mild parkinsonism and for some reason she thinks of these medicines of causes. His neurologist has negated this and so do I. he feels stable on his current regimen and his mood is good and he is sleeping well. He is undergoing physical therapy to improve his balance. He is not yet on any anti-Parkinson drugs    Vitals: Ht 6' (1.829 m)   Allergies: Allergies  Allergen Reactions  . Sulfonamide Derivatives Itching, Rash and Other (See Comments)    whelps   Medical History: Past Medical History:  Diagnosis Date  . Anxiety    takes Valium as needed  . Arthritis   . Bladder cancer ([place])    takes Rapaflo daily  . Blood dyscrasia    07/08/16: pt told he was a "free bleeder" after bleeding a lot when derm cut off mole on forehead. No excessive bleeding with minor wounds at home, no bleeding problems perioperatively with prior surgeries.  . Chronic back pain    spondylolisthesis  . Cluster headaches   . Depression    takes Lexapro daily  . Essential hypertension, benign    takes  Diovan-HCT daily  . Joint pain   . Obstructive sleep apnea on CPAP    uses CPAP @ night  . Pneumonia    "several times"--last time about 3-4 yrs ago  . PONV (postoperative nausea and vomiting)    Hx: of "sometimes"  . Prediabetes   Patient has history of back pain, hypertension, tinnitus, Obstructive sleep apnea, knee surgery and back surgery. Patient also has bladder cancer and carpal tunnel release.  His primary care physician is Dr. [name] and he see Dr. Aline Brochure for his pain management.  Surgical History: Past Surgical History:  Procedure Laterality Date  . ANTERIOR CERVICAL DECOMP/DISCECTOMY FUSION N/A 06/05/2013   Procedure:  C3-4 Anterior Cervical Discectomy and Fusion, Allograft, Plate;  Surgeon: [name], MD;  Location: [facility];  Service: Orthopedics;   Laterality: N/A;  C3-4 Anterior Cervical Discectomy and Fusion, Allograft, Plate  . Arthroscopic knee surgery    . BACK SURGERY      x 2  . BLADDER SURGERY     x 5 to remove tumor  . CARPAL TUNNEL RELEASE    . CATARACT EXTRACTION W/PHACO  12/19/2012   Procedure: CATARACT EXTRACTION PHACO AND INTRAOCULAR LENS PLACEMENT (IOC);  Surgeon: [name], MD;  Location: [facility];  Service: Ophthalmology;  Laterality: Left;  CDE: 26.80  . CATARACT EXTRACTION W/PHACO  01/09/2013   Procedure: CATARACT EXTRACTION PHACO AND INTRAOCULAR LENS PLACEMENT (IOC);  Surgeon: [name], MD;  Location: [facility];  Service: Ophthalmology;  Laterality: Right;  CDE:22.83  . CERVICAL FUSION  06/05/2013   C 3  C4  . CYSTOSCOPY    . REFRACTIVE SURGERY     Hx: of  . RETINAL DETACHMENT SURGERY    . TRANSURETHRAL RESECTION OF BLADDER TUMOR Right 07/27/2015   Procedure: TRANSURETHRAL RESECTION OF BLADDER TUMOR (TURBT);  Surgeon: [name], MD;  Location: [facility];  Service: Urology;  Laterality: Right;   Family History: family history includes Alcohol abuse in his maternal grandfather and mother; Anxiety disorder in his sister; Cancer in his father; Depression in his mother; Hypertension in his father, mother, and sister. Reviewed and nothing is new except the neck surgery that is already on the surgical history.  Mental status examination Patient is casually dressed fairly groomed. He is pleasant and cooperative.  He maintained fair eye contact. His speech is clear coherent and fluent. His thought process is logical linear and goal-directed. He denies any auditory or visual hallucination. He denies any active or passive suicidal thinking and homicidal thinking. He described his mood As improved and his affect is brighter His attention and concentration is good. He's alert and oriented x3. His insight judgment and impulse control is okay.  Lab Results:  Results for orders placed or performed during the hospital encounter  of 08/05/16 (from the past 8736 hour(s))  CBC   Collection Time: 08/05/16  7:30 AM  Result Value Ref Range   WBC 8.1 4.0 - 10.5 K/uL   RBC 3.47 (L) 4.22 - 5.81 MIL/uL   Hemoglobin 10.6 (L) 13.0 - 17.0 g/dL   HCT 32.1 (L) 39.0 - 52.0 %   MCV 92.5 78.0 - 100.0 fL   MCH 30.5 26.0 - 34.0 pg   MCHC 33.0 30.0 - 36.0 g/dL   RDW 14.2 11.5 - 15.5 %   Platelets 228 150 - 400 K/uL  Basic metabolic panel   Collection Time: 08/05/16  7:30 AM  Result Value Ref Range   Sodium 139 135 - 145 mmol/L  Potassium 4.1 3.5 - 5.1 mmol/L   Chloride 103 101 - 111 mmol/L   CO2 27 22 - 32 mmol/L   Glucose, Bld 96 65 - 99 mg/dL   BUN 14 6 - 20 mg/dL   Creatinine, Ser 0.98 0.61 - 1.24 mg/dL   Calcium 8.5 (L) 8.9 - 10.3 mg/dL   GFR calc non Af Amer >60 >60 mL/min   GFR calc Af Amer >60 >60 mL/min   Anion gap 9 5 - 15  Results for orders placed or performed during the hospital encounter of 07/24/16 (from the past 8736 hour(s))  Basic metabolic panel   Collection Time: 07/24/16  4:11 PM  Result Value Ref Range   Sodium 134 (L) 135 - 145 mmol/L   Potassium 4.2 3.5 - 5.1 mmol/L   Chloride 102 101 - 111 mmol/L   CO2 25 22 - 32 mmol/L   Glucose, Bld 88 65 - 99 mg/dL   BUN 33 (H) 6 - 20 mg/dL   Creatinine, Ser 1.15 0.61 - 1.24 mg/dL   Calcium 8.3 (L) 8.9 - 10.3 mg/dL   GFR calc non Af Amer >60 >60 mL/min   GFR calc Af Amer >60 >60 mL/min   Anion gap 7 5 - 15  CBC with Differential   Collection Time: 07/24/16  4:11 PM  Result Value Ref Range   WBC 16.7 (H) 4.0 - 10.5 K/uL   RBC 3.71 (L) 4.22 - 5.81 MIL/uL   Hemoglobin 11.4 (L) 13.0 - 17.0 g/dL   HCT 34.0 (L) 39.0 - 52.0 %   MCV 91.6 78.0 - 100.0 fL   MCH 30.7 26.0 - 34.0 pg   MCHC 33.5 30.0 - 36.0 g/dL   RDW 14.8 11.5 - 15.5 %   Platelets 229 150 - 400 K/uL   Neutrophils Relative % 78 %   Neutro Abs 13.2 (H) 1.7 - 7.7 K/uL   Lymphocytes Relative 13 %   Lymphs Abs 2.2 0.7 - 4.0 K/uL   Monocytes Relative 6 %   Monocytes Absolute 0.9 0.1 - 1.0 K/uL    Eosinophils Relative 3 %   Eosinophils Absolute 0.5 0.0 - 0.7 K/uL   Basophils Relative 0 %   Basophils Absolute 0.0 0.0 - 0.1 K/uL  Urinalysis, Routine w reflex microscopic (not at [facility])   Collection Time: 07/24/16  4:15 PM  Result Value Ref Range   Color, Urine YELLOW YELLOW   APPearance CLEAR CLEAR   Specific Gravity, Urine 1.020 1.005 - 1.030   pH 5.5 5.0 - 8.0   Glucose, UA NEGATIVE NEGATIVE mg/dL   Hgb urine dipstick NEGATIVE NEGATIVE   Bilirubin Urine NEGATIVE NEGATIVE   Ketones, ur NEGATIVE NEGATIVE mg/dL   Protein, ur NEGATIVE NEGATIVE mg/dL   Nitrite NEGATIVE NEGATIVE   Leukocytes, UA NEGATIVE NEGATIVE  I-Stat CG4 Lactic Acid, ED   Collection Time: 07/24/16  4:41 PM  Result Value Ref Range   Lactic Acid, Venous 1.97 (HH) 0.5 - 1.9 mmol/L  I-Stat CG4 Lactic Acid, ED   Collection Time: 07/24/16  7:10 PM  Result Value Ref Range   Lactic Acid, Venous 1.54 0.5 - 1.9 mmol/L  Results for orders placed or performed during the hospital encounter of 07/22/16 (from the past 8736 hour(s))  CBC   Collection Time: 07/22/16  7:00 AM  Result Value Ref Range   WBC 14.2 (H) 4.0 - 10.5 K/uL   RBC 3.70 (L) 4.22 - 5.81 MIL/uL   Hemoglobin 11.3 (L) 13.0 - 17.0 g/dL  HCT 33.7 (L) 39.0 - 52.0 %   MCV 91.1 78.0 - 100.0 fL   MCH 30.5 26.0 - 34.0 pg   MCHC 33.5 30.0 - 36.0 g/dL   RDW 14.4 11.5 - 15.5 %   Platelets 197 150 - 400 K/uL  Basic metabolic panel   Collection Time: 07/22/16  7:00 AM  Result Value Ref Range   Sodium 136 135 - 145 mmol/L   Potassium 3.6 3.5 - 5.1 mmol/L   Chloride 103 101 - 111 mmol/L   CO2 28 22 - 32 mmol/L   Glucose, Bld 90 65 - 99 mg/dL   BUN 28 (H) 6 - 20 mg/dL   Creatinine, Ser 0.89 0.61 - 1.24 mg/dL   Calcium 7.8 (L) 8.9 - 10.3 mg/dL   GFR calc non Af Amer >60 >60 mL/min   GFR calc Af Amer >60 >60 mL/min   Anion gap 5 5 - 15  Hemoglobin A1c   Collection Time: 07/22/16  7:00 AM  Result Value Ref Range   Hgb A1c MFr Bld 5.8 (H) 4.8 - 5.6 %    Mean Plasma Glucose 120 mg/dL  Results for orders placed or performed during the hospital encounter of 07/15/16 (from the past 8736 hour(s))  I-STAT 7, (LYTES, BLD GAS, ICA, H+H)   Collection Time: 07/15/16 12:39 PM  Result Value Ref Range   pH, Arterial 7.281 (L) 7.350 - 7.450   pCO2 arterial 54.7 (H) 35.0 - 45.0 mmHg   pO2, Arterial 124.0 (H) 80.0 - 100.0 mmHg   Bicarbonate 26.3 (H) 20.0 - 24.0 mEq/L   TCO2 28 0 - 100 mmol/L   O2 Saturation 98.0 %   Acid-base deficit 2.0 0.0 - 2.0 mmol/L   Sodium 141 135 - 145 mmol/L   Potassium 3.4 (L) 3.5 - 5.1 mmol/L   Calcium, Ion 1.21 1.12 - 1.23 mmol/L   HCT 35.0 (L) 39.0 - 52.0 %   Hemoglobin 11.9 (L) 13.0 - 17.0 g/dL   Patient temperature 35.1 C    Sample type ARTERIAL   Troponin I   Collection Time: 07/15/16  3:27 PM  Result Value Ref Range   Troponin I <0.03 <0.03 ng/mL  Basic metabolic panel   Collection Time: 07/15/16  3:27 PM  Result Value Ref Range   Sodium 137 135 - 145 mmol/L   Potassium 3.1 (L) 3.5 - 5.1 mmol/L   Chloride 105 101 - 111 mmol/L   CO2 27 22 - 32 mmol/L   Glucose, Bld 177 (H) 65 - 99 mg/dL   BUN 9 6 - 20 mg/dL   Creatinine, Ser 1.15 0.61 - 1.24 mg/dL   Calcium 7.8 (L) 8.9 - 10.3 mg/dL   GFR calc non Af Amer >60 >60 mL/min   GFR calc Af Amer >60 >60 mL/min   Anion gap 5 5 - 15  Magnesium   Collection Time: 07/15/16  3:27 PM  Result Value Ref Range   Magnesium 1.3 (L) 1.7 - 2.4 mg/dL  CBC with Differential/Platelet   Collection Time: 07/15/16  3:27 PM  Result Value Ref Range   WBC 20.6 (H) 4.0 - 10.5 K/uL   RBC 3.82 (L) 4.22 - 5.81 MIL/uL   Hemoglobin 11.1 (L) 13.0 - 17.0 g/dL   HCT 34.1 (L) 39.0 - 52.0 %   MCV 89.3 78.0 - 100.0 fL   MCH 29.1 26.0 - 34.0 pg   MCHC 32.6 30.0 - 36.0 g/dL   RDW 13.4 11.5 - 15.5 %   Platelets 164  150 - 400 K/uL   Neutrophils Relative % 95 %   Neutro Abs 19.5 (H) 1.7 - 7.7 K/uL   Lymphocytes Relative 2 %   Lymphs Abs 0.4 (L) 0.7 - 4.0 K/uL   Monocytes Relative 3 %    Monocytes Absolute 0.6 0.1 - 1.0 K/uL   Eosinophils Relative 0 %   Eosinophils Absolute 0.0 0.0 - 0.7 K/uL   Basophils Relative 0 %   Basophils Absolute 0.0 0.0 - 0.1 K/uL  Phosphorus   Collection Time: 07/15/16  3:27 PM  Result Value Ref Range   Phosphorus 3.1 2.5 - 4.6 mg/dL  Calcium, ionized   Collection Time: 07/15/16  3:27 PM  Result Value Ref Range   Calcium, Ionized, Serum 4.7 4.5 - 5.6 mg/dL  ECHOCARDIOGRAM COMPLETE   Collection Time: 07/15/16  3:50 PM  Result Value Ref Range   Weight 3,584 oz   BP 163/86 mmHg   LV PW d 14.6 (A) 0.6 - 1.1 mm   FS 39 28 - 44 %   LA ID, A-P, ES 33 mm   IVS/LV PW RATIO, ED 1.12    Stroke v 41 ml   LVOT VTI 19.1 cm   Reg peak vel 189 cm/s   RV sys press 17 mmHg   LV e' LATERAL 5.33 cm/s   LV E/e' medial 12.48    LV E/e'average 12.48    LA diam index 1.47 cm/m2   LA vol A4C 47.5 ml   E decel time 458 msec   LVOT diameter 21 mm   LVOT area 3.46 cm2   LVOT peak vel 87.2 cm/s   LVOT SV 66 mL   E/e' ratio 12.48    MV pk E vel 66.5 m/s   TR max vel 189 cm/s   MV pk A vel 89.7 m/s   LV sys vol 24 21 - 61 mL   LV sys vol index 11 mL/m2   LV dias vol 65 62 - 150 mL   LV dias vol index 29 mL/m2   MV Dec 458    LA diam end sys 33 mm   Simpson's disk 63    TDI e' medial 5    TDI e' lateral 5.33    TAPSE 18.2 mm  Blood gas, arterial   Collection Time: 07/15/16  4:15 PM  Result Value Ref Range   FIO2 0.70    Delivery systems VENTILATOR    Mode PRESSURE REGULATED VOLUME CONTROL    VT 620 mL   LHR 14 resp/min   Peep/cpap 5.0 cm H20   pH, Arterial 7.415 7.350 - 7.450   pCO2 arterial 40.5 35.0 - 45.0 mmHg   pO2, Arterial 105 (H) 80.0 - 100.0 mmHg   Bicarbonate 26.0 (H) 20.0 - 24.0 mEq/L   TCO2 27.4 0 - 100 mmol/L   Acid-Base Excess 1.6 0.0 - 2.0 mmol/L   O2 Saturation 98.1 %   Patient temperature 95.5    Collection site A-LINE    Drawn by [id number]    Sample type ARTERIAL DRAW    Allens test (pass/fail) PASS PASS  Troponin I    Collection Time: 07/15/16  9:55 PM  Result Value Ref Range   Troponin I <0.03 <0.03 ng/mL  Blood gas, arterial   Collection Time: 07/16/16  3:42 AM  Result Value Ref Range   FIO2 0.50    Delivery systems VENTILATOR    Mode PRESSURE REGULATED VOLUME CONTROL    VT 620 mL   LHR  14 resp/min   Peep/cpap 5.0 cm H20   pH, Arterial 7.420 7.350 - 7.450   pCO2 arterial 41.4 35.0 - 45.0 mmHg   pO2, Arterial 95.8 80.0 - 100.0 mmHg   Bicarbonate 26.3 (H) 20.0 - 24.0 mEq/L   TCO2 27.6 0 - 100 mmol/L   Acid-Base Excess 2.2 (H) 0.0 - 2.0 mmol/L   O2 Saturation 97.6 %   Patient temperature 98.6    Collection site A-LINE    Drawn by [phone]    Sample type ARTERIAL DRAW    Allens test (pass/fail) PASS PASS  Troponin I   Collection Time: 07/16/16  4:00 AM  Result Value Ref Range   Troponin I <0.03 <0.03 ng/mL  CBC with Differential/Platelet   Collection Time: 07/16/16  4:00 AM  Result Value Ref Range   WBC 17.2 (H) 4.0 - 10.5 K/uL   RBC 3.70 (L) 4.22 - 5.81 MIL/uL   Hemoglobin 10.8 (L) 13.0 - 17.0 g/dL   HCT 32.7 (L) 39.0 - 52.0 %   MCV 88.4 78.0 - 100.0 fL   MCH 29.2 26.0 - 34.0 pg   MCHC 33.0 30.0 - 36.0 g/dL   RDW 13.5 11.5 - 15.5 %   Platelets 153 150 - 400 K/uL   Neutrophils Relative % 93 %   Neutro Abs 16.0 (H) 1.7 - 7.7 K/uL   Lymphocytes Relative 5 %   Lymphs Abs 0.8 0.7 - 4.0 K/uL   Monocytes Relative 2 %   Monocytes Absolute 0.3 0.1 - 1.0 K/uL   Eosinophils Relative 0 %   Eosinophils Absolute 0.0 0.0 - 0.7 K/uL   Basophils Relative 0 %   Basophils Absolute 0.0 0.0 - 0.1 K/uL  Magnesium   Collection Time: 07/16/16  4:00 AM  Result Value Ref Range   Magnesium 1.4 (L) 1.7 - 2.4 mg/dL  Renal function panel   Collection Time: 07/16/16  4:00 AM  Result Value Ref Range   Sodium 138 135 - 145 mmol/L   Potassium 4.1 3.5 - 5.1 mmol/L   Chloride 106 101 - 111 mmol/L   CO2 26 22 - 32 mmol/L   Glucose, Bld 138 (H) 65 - 99 mg/dL   BUN 8 6 - 20 mg/dL   Creatinine, Ser 1.03 0.61 -  1.24 mg/dL   Calcium 8.1 (L) 8.9 - 10.3 mg/dL   Phosphorus 2.6 2.5 - 4.6 mg/dL   Albumin 3.0 (L) 3.5 - 5.0 g/dL   GFR calc non Af Amer >60 >60 mL/min   GFR calc Af Amer >60 >60 mL/min   Anion gap 6 5 - 15  Basic metabolic panel   Collection Time: 07/17/16  6:05 AM  Result Value Ref Range   Sodium 141 135 - 145 mmol/L   Potassium 4.2 3.5 - 5.1 mmol/L   Chloride 107 101 - 111 mmol/L   CO2 28 22 - 32 mmol/L   Glucose, Bld 150 (H) 65 - 99 mg/dL   BUN 15 6 - 20 mg/dL   Creatinine, Ser 1.08 0.61 - 1.24 mg/dL   Calcium 8.1 (L) 8.9 - 10.3 mg/dL   GFR calc non Af Amer >60 >60 mL/min   GFR calc Af Amer >60 >60 mL/min   Anion gap 6 5 - 15  CBC   Collection Time: 07/17/16  6:05 AM  Result Value Ref Range   WBC 18.2 (H) 4.0 - 10.5 K/uL   RBC 3.46 (L) 4.22 - 5.81 MIL/uL   Hemoglobin 10.1 (L) 13.0 - 17.0 g/dL  HCT 30.6 (L) 39.0 - 52.0 %   MCV 88.4 78.0 - 100.0 fL   MCH 29.2 26.0 - 34.0 pg   MCHC 33.0 30.0 - 36.0 g/dL   RDW 13.9 11.5 - 15.5 %   Platelets 179 150 - 400 K/uL  I-STAT 4, (NA,K, GLUC, HGB,HCT)   Collection Time: 07/17/16  6:02 PM  Result Value Ref Range   Sodium 144 135 - 145 mmol/L   Potassium 4.1 3.5 - 5.1 mmol/L   Glucose, Bld 138 (H) 65 - 99 mg/dL   HCT 27.0 (L) 39.0 - 52.0 %   Hemoglobin 9.2 (L) 13.0 - 17.0 g/dL  Results for orders placed or performed during the hospital encounter of 07/07/16 (from the past 8736 hour(s))  Surgical pcr screen   Collection Time: 07/07/16 11:30 AM  Result Value Ref Range   MRSA, PCR NEGATIVE NEGATIVE   Staphylococcus aureus NEGATIVE NEGATIVE  Basic metabolic panel   Collection Time: 07/07/16 11:30 AM  Result Value Ref Range   Sodium 141 135 - 145 mmol/L   Potassium 3.6 3.5 - 5.1 mmol/L   Chloride 107 101 - 111 mmol/L   CO2 26 22 - 32 mmol/L   Glucose, Bld 108 (H) 65 - 99 mg/dL   BUN 17 6 - 20 mg/dL   Creatinine, Ser 1.13 0.61 - 1.24 mg/dL   Calcium 8.9 8.9 - 10.3 mg/dL   GFR calc non Af Amer >60 >60 mL/min   GFR calc Af Amer  >60 >60 mL/min   Anion gap 8 5 - 15  CBC   Collection Time: 07/07/16 11:30 AM  Result Value Ref Range   WBC 8.6 4.0 - 10.5 K/uL   RBC 4.91 4.22 - 5.81 MIL/uL   Hemoglobin 14.9 13.0 - 17.0 g/dL   HCT 45.0 39.0 - 52.0 %   MCV 91.6 78.0 - 100.0 fL   MCH 30.3 26.0 - 34.0 pg   MCHC 33.1 30.0 - 36.0 g/dL   RDW 14.3 11.5 - 15.5 %   Platelets 202 150 - 400 K/uL  Type and screen [place]   Collection Time: 07/07/16 11:40 AM  Result Value Ref Range   ABO/RH(D) O POS    Antibody Screen NEG    Sample Expiration 07/21/2016    Extend sample reason NO TRANSFUSIONS OR PREGNANCY IN THE PAST 3 MONTHS   ABO/Rh   Collection Time: 07/07/16 11:40 AM  Result Value Ref Range   ABO/RH(D) O POS   Results for orders placed or performed during the hospital encounter of 05/17/16 (from the past 8736 hour(s))  Basic metabolic panel   Collection Time: 05/17/16 10:50 AM  Result Value Ref Range   Sodium 138 135 - 145 mmol/L   Potassium 3.9 3.5 - 5.1 mmol/L   Chloride 106 101 - 111 mmol/L   CO2 26 22 - 32 mmol/L   Glucose, Bld 100 (H) 65 - 99 mg/dL   BUN 18 6 - 20 mg/dL   Creatinine, Ser 1.16 0.61 - 1.24 mg/dL   Calcium 9.1 8.9 - 10.3 mg/dL   GFR calc non Af Amer >60 >60 mL/min   GFR calc Af Amer >60 >60 mL/min   Anion gap 6 5 - 15  CBC   Collection Time: 05/17/16 10:50 AM  Result Value Ref Range   WBC 9.8 4.0 - 10.5 K/uL   RBC 5.24 4.22 - 5.81 MIL/uL   Hemoglobin 15.4 13.0 - 17.0 g/dL   HCT 47.0 39.0 - 52.0 %  MCV 89.7 78.0 - 100.0 fL   MCH 29.4 26.0 - 34.0 pg   MCHC 32.8 30.0 - 36.0 g/dL   RDW 13.9 11.5 - 15.5 %   Platelets 206 150 - 400 K/uL  I-stat troponin, ED   Collection Time: 05/17/16 10:54 AM  Result Value Ref Range   Troponin i, poc 0.00 0.00 - 0.08 ng/mL   Comment 3          Urinalysis, Routine w reflex microscopic   Collection Time: 05/17/16 12:07 PM  Result Value Ref Range   Color, Urine YELLOW YELLOW   APPearance CLEAR CLEAR   Specific Gravity, Urine  1.020 1.005 - 1.030   pH 5.0 5.0 - 8.0   Glucose, UA NEGATIVE NEGATIVE mg/dL   Hgb urine dipstick MODERATE (A) NEGATIVE   Bilirubin Urine NEGATIVE NEGATIVE   Ketones, ur NEGATIVE NEGATIVE mg/dL   Protein, ur 30 (A) NEGATIVE mg/dL   Nitrite NEGATIVE NEGATIVE   Leukocytes, UA TRACE (A) NEGATIVE  Urine microscopic-add on   Collection Time: 05/17/16 12:07 PM  Result Value Ref Range   Squamous Epithelial / LPF 0-5 (A) NONE SEEN   WBC, UA 6-30 0 - 5 WBC/hpf   RBC / HPF 6-30 0 - 5 RBC/hpf   Bacteria, UA RARE (A) NONE SEEN   Casts HYALINE CASTS (A) NEGATIVE   Urine-Other MUCOUS PRESENT    Assessment Axis I Major depressive disorder, 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  I reviewed CC, tobacco/med/surg Hx, meds effects/ side effects, problem list, therapies and responses as well as current situation/symptoms discussed options. Patient will Continue Lexapro 20 mg daily. He can continue Valium 5 mg twice a day  He'll return in 6 months but call sooner if his depression worsens See orders and pt instructions for more details.  MEDICATIONS this encounter: Meds ordered this encounter  Medications  . escitalopram (LEXAPRO) 20 MG tablet    Zachary: Take 1 tablet (20 mg total) by mouth daily.    Dispense:  30 tablet    Refill:  5  . diazepam (VALIUM) 5 MG tablet    Zachary: Take 1 tablet (5 mg total) by mouth 2 (two) times daily as needed for anxiety.    Dispense:  60 tablet    Refill:  5    Medical Decision Making Problem Points:  Established problem, stable/improving (1), New problem, with no additional work-up planned (3), Review of last therapy session (1) and Review of psycho-social stressors (1) Data Points:  Review or order clinical lab tests (1) Review of medication regiment & side effects (2) Review of new medications or change in dosage (2)  I  certify that outpatient services furnished can reasonably be expected to improve the patient's condition.   [name], MD

## 2017-02-01 NOTE — Telephone Encounter (Signed)
He needs to sign a release

## 2017-02-01 NOTE — Telephone Encounter (Signed)
Voice message from patient.  He said his wife is not in agreement with the meds prescribed for him.   He would like Dr. Harrington Challenger to call and speak with his wife regarding medications.   No written release on file, only verbal.

## 2017-02-01 NOTE — Telephone Encounter (Signed)
I called patient and wife. I explained that diazepam does not cause parkinson's dz or make it worse. Wife is concerned that he has been on these diazepam too long and that it is making him worse in other ways (lethargy, fatigue) and questions whether it is still necessary for him to be on it. I advised patient and wife to discuss with Dr. Harrington Challenger further re: mood mgmt and medications. -VRP

## 2017-02-01 NOTE — Telephone Encounter (Signed)
02/01/17 pt cx the 2/9 appt.  He said that he was looking at his bank balance and just can't afford the copay twice a week.  He said that when he comes tomorrow he will talk to [place] about reducing him down to one time a week

## 2017-02-01 NOTE — Patient Instructions (Signed)
Patient must remain on Valium for treatment of anxiety and Lexapro for. Depression. These medications do not cause Parkinson's disease but actually help the symptoms

## 2017-02-01 NOTE — Telephone Encounter (Signed)
Spoke with patient who stated his wife wants him to go off Diazepam. He stated "Dr Leta Baptist is his neurologist, and he wants to do what he says".  This RN reviewed Dr Alan Ripper office note from today re: patient needs to stay on Valium for his anxiety.  Patient stated he "feels caught in the middle" and wants to know what Dr Leta Baptist thinks. He stated that "the Internet says it causes Parkinson's". But he stated he wants to do what Dr Leta Baptist tells him to do. This RN stated she would discuss with Dr Leta Baptist and call him back.  He verbalized understanding, appreciation.

## 2017-02-02 ENCOUNTER — Ambulatory Visit (HOSPITAL_COMMUNITY): Payer: Medicare HMO | Admitting: Physical Therapy

## 2017-02-02 ENCOUNTER — Encounter (HOSPITAL_COMMUNITY): Payer: Self-pay | Admitting: Psychiatry

## 2017-02-02 DIAGNOSIS — R29898 Other symptoms and signs involving the musculoskeletal system: Secondary | ICD-10-CM | POA: Diagnosis not present

## 2017-02-02 DIAGNOSIS — R2681 Unsteadiness on feet: Secondary | ICD-10-CM | POA: Diagnosis not present

## 2017-02-02 DIAGNOSIS — R262 Difficulty in walking, not elsewhere classified: Secondary | ICD-10-CM | POA: Diagnosis not present

## 2017-02-02 DIAGNOSIS — R2689 Other abnormalities of gait and mobility: Secondary | ICD-10-CM | POA: Diagnosis not present

## 2017-02-02 DIAGNOSIS — M6281 Muscle weakness (generalized): Secondary | ICD-10-CM

## 2017-02-02 NOTE — Therapy (Signed)
[facility] [place], [place], [address] Phone: [phone]   Fax:  [phone]  Physical Therapy Treatment  Patient Details  Name: Zachary [name] MRN: [id number] Date of Birth: 04/27/1946 Referring Provider: Penni Bombard  Encounter Date: 02/02/2017      PT End of Session - 02/02/17 1521    Visit Number 3   Number of Visits 13   Date for PT Re-Evaluation 02/17/17   Authorization Type [employer]    Authorization Time Period 01/27/17 to 03/10/17   Authorization - Visit Number 3   Authorization - Number of Visits 10   PT Start Time 1440   PT Stop Time 1514   PT Time Calculation (min) 34 min   Activity Tolerance Patient tolerated treatment well   Behavior During Therapy [facility] for tasks assessed/performed      Past Medical History:  Diagnosis Date  . Anxiety    takes Valium as needed  . Arthritis   . Bladder cancer ([place])    takes Rapaflo daily  . Blood dyscrasia    07/08/16: pt told he was a "free bleeder" after bleeding a lot when derm cut off mole on forehead. No excessive bleeding with minor wounds at home, no bleeding problems perioperatively with prior surgeries.  . Chronic back pain    spondylolisthesis  . Cluster headaches   . Depression    takes Lexapro daily  . Essential hypertension, benign    takes Diovan-HCT daily  . Joint pain   . Obstructive sleep apnea on CPAP    uses CPAP @ night  . Pneumonia    "several times"--last time about 3-4 yrs ago  . PONV (postoperative nausea and vomiting)    Hx: of "sometimes"  . Prediabetes     Past Surgical History:  Procedure Laterality Date  . ANTERIOR CERVICAL DECOMP/DISCECTOMY FUSION N/A 06/05/2013   Procedure:  C3-4 Anterior Cervical Discectomy and Fusion, Allograft, Plate;  Surgeon: [name], MD;  Location: [facility];  Service: Orthopedics;  Laterality: N/A;  C3-4 Anterior Cervical Discectomy and Fusion, Allograft, Plate  . Arthroscopic knee surgery     . BACK SURGERY      x 2  . BLADDER SURGERY     x 5 to remove tumor  . CARPAL TUNNEL RELEASE    . CATARACT EXTRACTION W/PHACO  12/19/2012   Procedure: CATARACT EXTRACTION PHACO AND INTRAOCULAR LENS PLACEMENT (IOC);  Surgeon: [name], MD;  Location: [facility];  Service: Ophthalmology;  Laterality: Left;  CDE: 26.80  . CATARACT EXTRACTION W/PHACO  01/09/2013   Procedure: CATARACT EXTRACTION PHACO AND INTRAOCULAR LENS PLACEMENT (IOC);  Surgeon: [name], MD;  Location: [facility];  Service: Ophthalmology;  Laterality: Right;  CDE:22.83  . CERVICAL FUSION  06/05/2013   C 3  C4  . CYSTOSCOPY    . REFRACTIVE SURGERY     Hx: of  . RETINAL DETACHMENT SURGERY    . TRANSURETHRAL RESECTION OF BLADDER TUMOR Right 07/27/2015   Procedure: TRANSURETHRAL RESECTION OF BLADDER TUMOR (TURBT);  Surgeon: [name], MD;  Location: [facility];  Service: Urology;  Laterality: Right;    There were no vitals filed for this visit.      Subjective Assessment - 02/02/17 1517    Subjective Patient arrives today stating that he is feeling better, he is moving better and has very minor pain in his back   Pertinent History HTN, postural hypotension, dysphagia, CNS dysfunction, knee OA, HNP, spinal stenosis,  history of bladder cancer, cervical fusion, history of multiple back surgeries    Patient Stated Goals walk better, prevent falls, get back to PLOF    Currently in Pain? Yes   Pain Score 3    Pain Location Back   Pain Orientation Lower                         [facility] Adult PT Treatment/Exercise - 02/02/17 0001      Ambulation/Gait   Gait Comments dual tasking gait forwards and backwards with ball toss, randomized questions            PWR [facility]) - 02/02/17 1518    PWR! Up 1x12, ball toss and cones, standing    PWR! Rock 2x6, cones in parallel bars, standing    PWR! Twist 2x6, cones in parallel bars, standing   PWR Step 4x4 with small hurdles/large hurdles, ball toss, standing               PT Education - 02/02/17 1520    Education provided Yes   Education Details get in habit of regular aerobic activitiy/swimming, standing PWR form, importance of regular aerobic activity    Person(s) Educated Patient   Methods Explanation   Comprehension Verbalized understanding          PT Short Term Goals - 01/27/17 1529      PT SHORT TERM GOAL #1   Title Patient to be able to identify 5/5 safety precautions/concepts in order to assist in reducing falls and improving general safety with mobility    Time 3   Period Weeks   Status New     PT SHORT TERM GOAL #2   Title Patient to show improved gait mechanics including increased BOS with gait, improved toe clearance, elimination of drift, improved heel-toe mechancis, and improved balance in order to reduce fall risk and improve efficiency of mobility    Time 3   Period Weeks   Status New     PT SHORT TERM GOAL #3   Title Patient to be able to perform regular TUG in 12 seconds or less and dual tasking TUG in 14 seconds or less in order to show improved balance and general mobiltiy    Time 3   Period Weeks   Status New     PT SHORT TERM GOAL #4   Title Patient to be able to perform PWR based HEP without pain exacerbation correctly and consistently in order to improve self-efficacy in managing condition    Time 2   Period Weeks   Status New           PT Long Term Goals - 01/27/17 1532      PT LONG TERM GOAL #1   Title Patient to demonstrate strength 5/5 in all tested muscle groups in order to facilitate improved mobility within community    Time 6   Period Weeks   Status New     PT LONG TERM GOAL #2   Title Patient to be able to ambulate 656ft during 3MWT in order to show improved efficiency of mobiltiy and reduced fall risk    Time 6   Period Weeks   Status New     PT LONG TERM GOAL #3   Title Patient to score at least 21/24 on DGI in order to show improved balance in dynamic scenarios and overall  reduced fall risk    Time 6   Period Weeks  Status New     PT LONG TERM GOAL #4   Title Patient to be able to verbalize the importance of regular aerobic activity in managing Parkinsons Disease and will be participatory in regular aerobic exercise at least 20-30 minutes in duration and 4 days per week in order to assist in managing condition moving forward    Time 6   Period Weeks   Status New               Plan - 02/02/17 1522    Clinical Impression Statement Began session on Nustep (not included in billing) with progressed settings today. Introduced standing PWR exercises today with assist of second person to fully challenge extent of standing exercises; min guard with occasional min assist required for balance in parallel bars. Also worked on standing/walking dual tasks activities with ball toss/randomized questions in hallway today. Patient did very well today however reports that he has needed to adjust visit numbers due to financial concerns; discussed importance of forming habit for regular aerobic physical activity between PT session.    Rehab Potential Good   Clinical Impairments Affecting Rehab Potential (+) has had reasonable success with PT in the past, quickly referred to PT after diagnosis of Parkinsonism; (-) ongoing severe back pain, history of falling    PT Frequency 2x / week   PT Duration 6 weeks   PT Treatment/Interventions ADLs/Self Care Home Management;Biofeedback;DME Instruction;Gait training;Stair training;Functional mobility training;Therapeutic activities;Therapeutic exercise;Balance training;Neuromuscular re-education;Patient/family education;Manual techniques;Passive range of motion;Energy conservation;Taping   PT Next Visit Plan seated and standing PWR training; gait and balance training, trial treadmill . Update HEP    Consulted and Agree with Plan of Care Patient      Patient will benefit from skilled therapeutic intervention in order to improve the  following deficits and impairments:  Abnormal gait, Improper body mechanics, Pain, Decreased coordination, Decreased mobility, Postural dysfunction, Decreased activity tolerance, Decreased strength, Decreased balance, Decreased safety awareness, Difficulty walking, Impaired flexibility  Visit Diagnosis: Unsteadiness on feet  Other abnormalities of gait and mobility  Difficulty in walking, not elsewhere classified  Muscle weakness (generalized)  Other symptoms and signs involving the musculoskeletal system     Problem List Patient Active Problem List   Diagnosis Date Noted  . Postural hypotension 07/23/2016  . Dysphagia 07/23/2016  . Obstructive sleep apnea 07/21/2016  . Hyperglycemia 07/21/2016  . Anemia, unspecified 07/21/2016  . Spinal stenosis of lumbar region 07/15/2016  . Bladder tumor 07/27/2015  . HNP (herniated nucleus pulposus), cervical 06/05/2013    Class: Diagnosis of  . 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  . Muscle spasms of neck 04/04/2013  . 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 OA (osteoarthritis) of knee 02/22/2013  . Iliotibial band syndrome of left side 11/22/2012  . Localized, primary osteoarthritis of the ankle and foot 10/11/2012  . Difficulty in walking(719.7) 08/17/2012  . Peroneal tendinitis 08/16/2012  . Precordial pain 02/09/2012  . Essential hypertension, benign 02/09/2012  . Mixed hyperlipidemia 02/09/2012  . Dysthymia 02/04/2012  . Abnormality of gait 12/23/2011  . Lateral epicondylitis/tennis elbow 05/06/2011  . TENOSYNOVITIS OF FOOT AND ANKLE 10/22/2010    Deniece Ree PT, DPT [facility] [address] [place], [place], [address] Phone: [phone]   Fax:  [phone]  Name: NITAI CONSER MRN: [id number] Date of Birth: [birth year]

## 2017-02-04 NOTE — Telephone Encounter (Signed)
FYI:  Spoke with pt and informed him that provider would like him to have a release for office to talk with his wife. Per pt he would also like to sch appt for his wife, provider and him to discuss his medications. Appt was scheduled and pt stated he will complete his release for wife at appt.

## 2017-02-04 NOTE — Telephone Encounter (Signed)
noted 

## 2017-02-05 ENCOUNTER — Ambulatory Visit (HOSPITAL_COMMUNITY): Payer: Medicare HMO

## 2017-02-10 ENCOUNTER — Ambulatory Visit (HOSPITAL_COMMUNITY): Payer: Medicare HMO | Admitting: Physical Therapy

## 2017-02-10 ENCOUNTER — Encounter (HOSPITAL_COMMUNITY): Payer: Self-pay | Admitting: Psychiatry

## 2017-02-10 ENCOUNTER — Ambulatory Visit (INDEPENDENT_AMBULATORY_CARE_PROVIDER_SITE_OTHER): Payer: Medicare HMO | Admitting: Psychiatry

## 2017-02-10 ENCOUNTER — Encounter (HOSPITAL_COMMUNITY): Payer: Self-pay | Admitting: Physical Therapy

## 2017-02-10 VITALS — BP 140/81 | HR 75 | Ht 72.0 in | Wt 225.0 lb

## 2017-02-10 DIAGNOSIS — R29898 Other symptoms and signs involving the musculoskeletal system: Secondary | ICD-10-CM

## 2017-02-10 DIAGNOSIS — R2681 Unsteadiness on feet: Secondary | ICD-10-CM | POA: Diagnosis not present

## 2017-02-10 DIAGNOSIS — F331 Major depressive disorder, recurrent, moderate: Secondary | ICD-10-CM | POA: Diagnosis not present

## 2017-02-10 DIAGNOSIS — Z8249 Family history of ischemic heart disease and other diseases of the circulatory system: Secondary | ICD-10-CM

## 2017-02-10 DIAGNOSIS — Z882 Allergy status to sulfonamides status: Secondary | ICD-10-CM

## 2017-02-10 DIAGNOSIS — R2689 Other abnormalities of gait and mobility: Secondary | ICD-10-CM

## 2017-02-10 DIAGNOSIS — Z818 Family history of other mental and behavioral disorders: Secondary | ICD-10-CM

## 2017-02-10 DIAGNOSIS — Z811 Family history of alcohol abuse and dependence: Secondary | ICD-10-CM | POA: Diagnosis not present

## 2017-02-10 DIAGNOSIS — M6281 Muscle weakness (generalized): Secondary | ICD-10-CM

## 2017-02-10 DIAGNOSIS — R262 Difficulty in walking, not elsewhere classified: Secondary | ICD-10-CM

## 2017-02-10 DIAGNOSIS — Z9889 Other specified postprocedural states: Secondary | ICD-10-CM | POA: Diagnosis not present

## 2017-02-10 MED ORDER — FLUOXETINE HCL 20 MG PO CAPS: 20.0000 mg | ORAL_CAPSULE | Freq: Every day | ORAL | 2 refills | Status: DC

## 2017-02-10 NOTE — Patient Instructions (Signed)
Stop lexapro  Start Prozac 20 mg in the am Take valium at bedtime Stay active

## 2017-02-10 NOTE — Progress Notes (Signed)
Patient ID: Zachary [name], male   DOB: 08-26-1946, 71 y.o.   MRN: [id number] Patient ID: Zachary [name], male   DOB: Sep 30, 1946, 71 y.o.   MRN: [id number] Patient ID: Zachary [name], male   DOB: October 28, 1946, 71 y.o.   MRN: [id number] Patient ID: Zachary [name], male   DOB: 10/31/1946, 71 y.o.   MRN: [id number] Patient ID: Zachary [name], male   DOB: 02-06-1946, 71 y.o.   MRN: [id number] Patient ID: Zachary [name], male   DOB: 02-21-1946, 71 y.o.   MRN: [id number] Patient ID: Zachary [name], male   DOB: 08/31/1946, 71 y.o.   MRN: [id number] Patient ID: Zachary [name], male   DOB: 1946-06-02, 71 y.o.   MRN: [id number] Patient ID: Zachary [name], male   DOB: 1946/06/20, 71 y.o.   MRN: [id number] Patient ID: Zachary [name], male   DOB: August 30, 1946, 71 y.o.   MRN: [id number] Patient ID: Zachary [name], male   DOB: [birth year], 71 y.o.   MRN: [id number] [facility] [id number] Progress Note JAMANI FAZZIO MRN: [id number] DOB: 01-Oct-1946 Age: 71 y.o.  Date: 02/10/2017 Start Time: 3:10 PM End Time: 3:33 PM  Chief Complaint: Chief Complaint  Patient presents with  . Depression  . Anxiety  . Follow-up   Subjective: "My wife is back."  This patient is a 71 year-old married white male who lives with his wife in [place] they have no children. 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  The patient states he got depressed several years ago after his diagnosed with stage IV bladder cancer. He was seeing a psychologist here to deal with this and last year was placed on Lexapro. His cancer is gone now after 5 surgeries. He recently had back surgery as well but is recovered from this with no pain. He states that his mood is been great. He's excited about the football season. He is sleeping well and his energy is good  The patient returns after 3 weeks. He wanted me to speak to his wife  about the Valium. He is only taking 5 mg in the morning. His wife states that he has no energy or motivation that he sleeps all day and doesn't want to do anything and is very negative. I explained that the 5 mg of Valium isn't likely the cause of all of the symptoms and it sounds as if he is very depressed. He is upset about being diagnosed with parkinsonism. He admits that he has no energy or get up and go and doesn't feel like doing anything. I suggested we switch his Lexapro to Prozac and just use the Valium at night and he is agreeable. He supposed to be doing exercises for his back and I strongly suggested this and he is also going to start physical therapy.    Vitals: BP 140/81   Pulse 75   Ht 6' (1.829 m)   Wt 225 lb (102.1 kg)   SpO2 92%   BMI 30.52 kg/m   Allergies: Allergies  Allergen Reactions  . Sulfonamide Derivatives Itching, Rash and Other (See Comments)    whelps   Medical History: Past Medical History:  Diagnosis Date  . Anxiety    takes Valium as needed  . Arthritis   . Bladder cancer ([place])    takes Rapaflo daily  . Blood dyscrasia  07/08/16: pt told he was a "free bleeder" after bleeding a lot when derm cut off mole on forehead. No excessive bleeding with minor wounds at home, no bleeding problems perioperatively with prior surgeries.  . Chronic back pain    spondylolisthesis  . Cluster headaches   . Depression    takes Lexapro daily  . Essential hypertension, benign    takes Diovan-HCT daily  . Joint pain   . Obstructive sleep apnea on CPAP    uses CPAP @ night  . Pneumonia    "several times"--last time about 3-4 yrs ago  . PONV (postoperative nausea and vomiting)    Hx: of "sometimes"  . Prediabetes   Patient has history of back pain, hypertension, tinnitus, Obstructive sleep apnea, knee surgery and back surgery. Patient also has bladder cancer and carpal tunnel release.  His primary care physician is Dr. [name] and he see Dr. Aline Brochure for his pain  management.  Surgical History: Past Surgical History:  Procedure Laterality Date  . ANTERIOR CERVICAL DECOMP/DISCECTOMY FUSION N/A 06/05/2013   Procedure:  C3-4 Anterior Cervical Discectomy and Fusion, Allograft, Plate;  Surgeon: [name], MD;  Location: [facility];  Service: Orthopedics;  Laterality: N/A;  C3-4 Anterior Cervical Discectomy and Fusion, Allograft, Plate  . Arthroscopic knee surgery    . BACK SURGERY      x 2  . BLADDER SURGERY     x 5 to remove tumor  . CARPAL TUNNEL RELEASE    . CATARACT EXTRACTION W/PHACO  12/19/2012   Procedure: CATARACT EXTRACTION PHACO AND INTRAOCULAR LENS PLACEMENT (IOC);  Surgeon: [name], MD;  Location: [facility];  Service: Ophthalmology;  Laterality: Left;  CDE: 26.80  . CATARACT EXTRACTION W/PHACO  01/09/2013   Procedure: CATARACT EXTRACTION PHACO AND INTRAOCULAR LENS PLACEMENT (IOC);  Surgeon: [name], MD;  Location: [facility];  Service: Ophthalmology;  Laterality: Right;  CDE:22.83  . CERVICAL FUSION  06/05/2013   C 3  C4  . CYSTOSCOPY    . REFRACTIVE SURGERY     Hx: of  . RETINAL DETACHMENT SURGERY    . TRANSURETHRAL RESECTION OF BLADDER TUMOR Right 07/27/2015   Procedure: TRANSURETHRAL RESECTION OF BLADDER TUMOR (TURBT);  Surgeon: [name], MD;  Location: [facility];  Service: Urology;  Laterality: Right;   Family History: family history includes Alcohol abuse in his maternal grandfather and mother; Anxiety disorder in his sister; Cancer in his father; Depression in his mother; Hypertension in his father, mother, and sister. Reviewed and nothing is new except the neck surgery that is already on the surgical history.  Mental status examination Patient is casually dressed fairly groomed. He is pleasant and cooperative.  He maintained fair eye contact. His speech is clear coherent and fluent. His thought process is logical linear and goal-directed. He denies any auditory or visual hallucination. He denies any active or passive suicidal  thinking and homicidal thinking. He described his mood As i depressed and his affect is constricted His attention and concentration is good. He's alert and oriented x3. His insight judgment and impulse control is okay.  Lab Results:  Results for orders placed or performed during the hospital encounter of 08/05/16 (from the past 8736 hour(s))  CBC   Collection Time: 08/05/16  7:30 AM  Result Value Ref Range   WBC 8.1 4.0 - 10.5 K/uL   RBC 3.47 (L) 4.22 - 5.81 MIL/uL   Hemoglobin 10.6 (L) 13.0 - 17.0 g/dL   HCT 32.1 (L) 39.0 - 52.0 %  MCV 92.5 78.0 - 100.0 fL   MCH 30.5 26.0 - 34.0 pg   MCHC 33.0 30.0 - 36.0 g/dL   RDW 14.2 11.5 - 15.5 %   Platelets 228 150 - 400 K/uL  Basic metabolic panel   Collection Time: 08/05/16  7:30 AM  Result Value Ref Range   Sodium 139 135 - 145 mmol/L   Potassium 4.1 3.5 - 5.1 mmol/L   Chloride 103 101 - 111 mmol/L   CO2 27 22 - 32 mmol/L   Glucose, Bld 96 65 - 99 mg/dL   BUN 14 6 - 20 mg/dL   Creatinine, Ser 0.98 0.61 - 1.24 mg/dL   Calcium 8.5 (L) 8.9 - 10.3 mg/dL   GFR calc non Af Amer >60 >60 mL/min   GFR calc Af Amer >60 >60 mL/min   Anion gap 9 5 - 15  Results for orders placed or performed during the hospital encounter of 07/24/16 (from the past 8736 hour(s))  Basic metabolic panel   Collection Time: 07/24/16  4:11 PM  Result Value Ref Range   Sodium 134 (L) 135 - 145 mmol/L   Potassium 4.2 3.5 - 5.1 mmol/L   Chloride 102 101 - 111 mmol/L   CO2 25 22 - 32 mmol/L   Glucose, Bld 88 65 - 99 mg/dL   BUN 33 (H) 6 - 20 mg/dL   Creatinine, Ser 1.15 0.61 - 1.24 mg/dL   Calcium 8.3 (L) 8.9 - 10.3 mg/dL   GFR calc non Af Amer >60 >60 mL/min   GFR calc Af Amer >60 >60 mL/min   Anion gap 7 5 - 15  CBC with Differential   Collection Time: 07/24/16  4:11 PM  Result Value Ref Range   WBC 16.7 (H) 4.0 - 10.5 K/uL   RBC 3.71 (L) 4.22 - 5.81 MIL/uL   Hemoglobin 11.4 (L) 13.0 - 17.0 g/dL   HCT 34.0 (L) 39.0 - 52.0 %   MCV 91.6 78.0 - 100.0 fL   MCH  30.7 26.0 - 34.0 pg   MCHC 33.5 30.0 - 36.0 g/dL   RDW 14.8 11.5 - 15.5 %   Platelets 229 150 - 400 K/uL   Neutrophils Relative % 78 %   Neutro Abs 13.2 (H) 1.7 - 7.7 K/uL   Lymphocytes Relative 13 %   Lymphs Abs 2.2 0.7 - 4.0 K/uL   Monocytes Relative 6 %   Monocytes Absolute 0.9 0.1 - 1.0 K/uL   Eosinophils Relative 3 %   Eosinophils Absolute 0.5 0.0 - 0.7 K/uL   Basophils Relative 0 %   Basophils Absolute 0.0 0.0 - 0.1 K/uL  Urinalysis, Routine w reflex microscopic (not at [facility])   Collection Time: 07/24/16  4:15 PM  Result Value Ref Range   Color, Urine YELLOW YELLOW   APPearance CLEAR CLEAR   Specific Gravity, Urine 1.020 1.005 - 1.030   pH 5.5 5.0 - 8.0   Glucose, UA NEGATIVE NEGATIVE mg/dL   Hgb urine dipstick NEGATIVE NEGATIVE   Bilirubin Urine NEGATIVE NEGATIVE   Ketones, ur NEGATIVE NEGATIVE mg/dL   Protein, ur NEGATIVE NEGATIVE mg/dL   Nitrite NEGATIVE NEGATIVE   Leukocytes, UA NEGATIVE NEGATIVE  I-Stat CG4 Lactic Acid, ED   Collection Time: 07/24/16  4:41 PM  Result Value Ref Range   Lactic Acid, Venous 1.97 (HH) 0.5 - 1.9 mmol/L  I-Stat CG4 Lactic Acid, ED   Collection Time: 07/24/16  7:10 PM  Result Value Ref Range   Lactic Acid, Venous 1.54  0.5 - 1.9 mmol/L  Results for orders placed or performed during the hospital encounter of 07/22/16 (from the past 8736 hour(s))  CBC   Collection Time: 07/22/16  7:00 AM  Result Value Ref Range   WBC 14.2 (H) 4.0 - 10.5 K/uL   RBC 3.70 (L) 4.22 - 5.81 MIL/uL   Hemoglobin 11.3 (L) 13.0 - 17.0 g/dL   HCT 33.7 (L) 39.0 - 52.0 %   MCV 91.1 78.0 - 100.0 fL   MCH 30.5 26.0 - 34.0 pg   MCHC 33.5 30.0 - 36.0 g/dL   RDW 14.4 11.5 - 15.5 %   Platelets 197 150 - 400 K/uL  Basic metabolic panel   Collection Time: 07/22/16  7:00 AM  Result Value Ref Range   Sodium 136 135 - 145 mmol/L   Potassium 3.6 3.5 - 5.1 mmol/L   Chloride 103 101 - 111 mmol/L   CO2 28 22 - 32 mmol/L   Glucose, Bld 90 65 - 99 mg/dL   BUN 28 (H) 6 - 20  mg/dL   Creatinine, Ser 0.89 0.61 - 1.24 mg/dL   Calcium 7.8 (L) 8.9 - 10.3 mg/dL   GFR calc non Af Amer >60 >60 mL/min   GFR calc Af Amer >60 >60 mL/min   Anion gap 5 5 - 15  Hemoglobin A1c   Collection Time: 07/22/16  7:00 AM  Result Value Ref Range   Hgb A1c MFr Bld 5.8 (H) 4.8 - 5.6 %   Mean Plasma Glucose 120 mg/dL  Results for orders placed or performed during the hospital encounter of 07/15/16 (from the past 8736 hour(s))  I-STAT 7, (LYTES, BLD GAS, ICA, H+H)   Collection Time: 07/15/16 12:39 PM  Result Value Ref Range   pH, Arterial 7.281 (L) 7.350 - 7.450   pCO2 arterial 54.7 (H) 35.0 - 45.0 mmHg   pO2, Arterial 124.0 (H) 80.0 - 100.0 mmHg   Bicarbonate 26.3 (H) 20.0 - 24.0 mEq/L   TCO2 28 0 - 100 mmol/L   O2 Saturation 98.0 %   Acid-base deficit 2.0 0.0 - 2.0 mmol/L   Sodium 141 135 - 145 mmol/L   Potassium 3.4 (L) 3.5 - 5.1 mmol/L   Calcium, Ion 1.21 1.12 - 1.23 mmol/L   HCT 35.0 (L) 39.0 - 52.0 %   Hemoglobin 11.9 (L) 13.0 - 17.0 g/dL   Patient temperature 35.1 C    Sample type ARTERIAL   Troponin I   Collection Time: 07/15/16  3:27 PM  Result Value Ref Range   Troponin I <0.03 <0.03 ng/mL  Basic metabolic panel   Collection Time: 07/15/16  3:27 PM  Result Value Ref Range   Sodium 137 135 - 145 mmol/L   Potassium 3.1 (L) 3.5 - 5.1 mmol/L   Chloride 105 101 - 111 mmol/L   CO2 27 22 - 32 mmol/L   Glucose, Bld 177 (H) 65 - 99 mg/dL   BUN 9 6 - 20 mg/dL   Creatinine, Ser 1.15 0.61 - 1.24 mg/dL   Calcium 7.8 (L) 8.9 - 10.3 mg/dL   GFR calc non Af Amer >60 >60 mL/min   GFR calc Af Amer >60 >60 mL/min   Anion gap 5 5 - 15  Magnesium   Collection Time: 07/15/16  3:27 PM  Result Value Ref Range   Magnesium 1.3 (L) 1.7 - 2.4 mg/dL  CBC with Differential/Platelet   Collection Time: 07/15/16  3:27 PM  Result Value Ref Range   WBC 20.6 (H) 4.0 -  10.5 K/uL   RBC 3.82 (L) 4.22 - 5.81 MIL/uL   Hemoglobin 11.1 (L) 13.0 - 17.0 g/dL   HCT 34.1 (L) 39.0 - 52.0 %    MCV 89.3 78.0 - 100.0 fL   MCH 29.1 26.0 - 34.0 pg   MCHC 32.6 30.0 - 36.0 g/dL   RDW 13.4 11.5 - 15.5 %   Platelets 164 150 - 400 K/uL   Neutrophils Relative % 95 %   Neutro Abs 19.5 (H) 1.7 - 7.7 K/uL   Lymphocytes Relative 2 %   Lymphs Abs 0.4 (L) 0.7 - 4.0 K/uL   Monocytes Relative 3 %   Monocytes Absolute 0.6 0.1 - 1.0 K/uL   Eosinophils Relative 0 %   Eosinophils Absolute 0.0 0.0 - 0.7 K/uL   Basophils Relative 0 %   Basophils Absolute 0.0 0.0 - 0.1 K/uL  Phosphorus   Collection Time: 07/15/16  3:27 PM  Result Value Ref Range   Phosphorus 3.1 2.5 - 4.6 mg/dL  Calcium, ionized   Collection Time: 07/15/16  3:27 PM  Result Value Ref Range   Calcium, Ionized, Serum 4.7 4.5 - 5.6 mg/dL  ECHOCARDIOGRAM COMPLETE   Collection Time: 07/15/16  3:50 PM  Result Value Ref Range   Weight 3,584 oz   BP 163/86 mmHg   LV PW d 14.6 (A) 0.6 - 1.1 mm   FS 39 28 - 44 %   LA ID, A-P, ES 33 mm   IVS/LV PW RATIO, ED 1.12    Stroke v 41 ml   LVOT VTI 19.1 cm   Reg peak vel 189 cm/s   RV sys press 17 mmHg   LV e' LATERAL 5.33 cm/s   LV E/e' medial 12.48    LV E/e'average 12.48    LA diam index 1.47 cm/m2   LA vol A4C 47.5 ml   E decel time 458 msec   LVOT diameter 21 mm   LVOT area 3.46 cm2   LVOT peak vel 87.2 cm/s   LVOT SV 66 mL   E/e' ratio 12.48    MV pk E vel 66.5 m/s   TR max vel 189 cm/s   MV pk A vel 89.7 m/s   LV sys vol 24 21 - 61 mL   LV sys vol index 11 mL/m2   LV dias vol 65 62 - 150 mL   LV dias vol index 29 mL/m2   MV Dec 458    LA diam end sys 33 mm   Simpson's disk 63    TDI e' medial 5    TDI e' lateral 5.33    TAPSE 18.2 mm  Blood gas, arterial   Collection Time: 07/15/16  4:15 PM  Result Value Ref Range   FIO2 0.70    Delivery systems VENTILATOR    Mode PRESSURE REGULATED VOLUME CONTROL    VT 620 mL   LHR 14 resp/min   Peep/cpap 5.0 cm H20   pH, Arterial 7.415 7.350 - 7.450   pCO2 arterial 40.5 35.0 - 45.0 mmHg   pO2, Arterial 105 (H) 80.0 -  100.0 mmHg   Bicarbonate 26.0 (H) 20.0 - 24.0 mEq/L   TCO2 27.4 0 - 100 mmol/L   Acid-Base Excess 1.6 0.0 - 2.0 mmol/L   O2 Saturation 98.1 %   Patient temperature 95.5    Collection site A-LINE    Drawn by [id number]    Sample type ARTERIAL DRAW    Allens test (pass/fail) PASS PASS  Troponin  I   Collection Time: 07/15/16  9:55 PM  Result Value Ref Range   Troponin I <0.03 <0.03 ng/mL  Blood gas, arterial   Collection Time: 07/16/16  3:42 AM  Result Value Ref Range   FIO2 0.50    Delivery systems VENTILATOR    Mode PRESSURE REGULATED VOLUME CONTROL    VT 620 mL   LHR 14 resp/min   Peep/cpap 5.0 cm H20   pH, Arterial 7.420 7.350 - 7.450   pCO2 arterial 41.4 35.0 - 45.0 mmHg   pO2, Arterial 95.8 80.0 - 100.0 mmHg   Bicarbonate 26.3 (H) 20.0 - 24.0 mEq/L   TCO2 27.6 0 - 100 mmol/L   Acid-Base Excess 2.2 (H) 0.0 - 2.0 mmol/L   O2 Saturation 97.6 %   Patient temperature 98.6    Collection site A-LINE    Drawn by [phone]    Sample type ARTERIAL DRAW    Allens test (pass/fail) PASS PASS  Troponin I   Collection Time: 07/16/16  4:00 AM  Result Value Ref Range   Troponin I <0.03 <0.03 ng/mL  CBC with Differential/Platelet   Collection Time: 07/16/16  4:00 AM  Result Value Ref Range   WBC 17.2 (H) 4.0 - 10.5 K/uL   RBC 3.70 (L) 4.22 - 5.81 MIL/uL   Hemoglobin 10.8 (L) 13.0 - 17.0 g/dL   HCT 32.7 (L) 39.0 - 52.0 %   MCV 88.4 78.0 - 100.0 fL   MCH 29.2 26.0 - 34.0 pg   MCHC 33.0 30.0 - 36.0 g/dL   RDW 13.5 11.5 - 15.5 %   Platelets 153 150 - 400 K/uL   Neutrophils Relative % 93 %   Neutro Abs 16.0 (H) 1.7 - 7.7 K/uL   Lymphocytes Relative 5 %   Lymphs Abs 0.8 0.7 - 4.0 K/uL   Monocytes Relative 2 %   Monocytes Absolute 0.3 0.1 - 1.0 K/uL   Eosinophils Relative 0 %   Eosinophils Absolute 0.0 0.0 - 0.7 K/uL   Basophils Relative 0 %   Basophils Absolute 0.0 0.0 - 0.1 K/uL  Magnesium   Collection Time: 07/16/16  4:00 AM  Result Value Ref Range   Magnesium 1.4 (L) 1.7 - 2.4  mg/dL  Renal function panel   Collection Time: 07/16/16  4:00 AM  Result Value Ref Range   Sodium 138 135 - 145 mmol/L   Potassium 4.1 3.5 - 5.1 mmol/L   Chloride 106 101 - 111 mmol/L   CO2 26 22 - 32 mmol/L   Glucose, Bld 138 (H) 65 - 99 mg/dL   BUN 8 6 - 20 mg/dL   Creatinine, Ser 1.03 0.61 - 1.24 mg/dL   Calcium 8.1 (L) 8.9 - 10.3 mg/dL   Phosphorus 2.6 2.5 - 4.6 mg/dL   Albumin 3.0 (L) 3.5 - 5.0 g/dL   GFR calc non Af Amer >60 >60 mL/min   GFR calc Af Amer >60 >60 mL/min   Anion gap 6 5 - 15  Basic metabolic panel   Collection Time: 07/17/16  6:05 AM  Result Value Ref Range   Sodium 141 135 - 145 mmol/L   Potassium 4.2 3.5 - 5.1 mmol/L   Chloride 107 101 - 111 mmol/L   CO2 28 22 - 32 mmol/L   Glucose, Bld 150 (H) 65 - 99 mg/dL   BUN 15 6 - 20 mg/dL   Creatinine, Ser 1.08 0.61 - 1.24 mg/dL   Calcium 8.1 (L) 8.9 - 10.3 mg/dL   GFR calc  non Af Amer >60 >60 mL/min   GFR calc Af Amer >60 >60 mL/min   Anion gap 6 5 - 15  CBC   Collection Time: 07/17/16  6:05 AM  Result Value Ref Range   WBC 18.2 (H) 4.0 - 10.5 K/uL   RBC 3.46 (L) 4.22 - 5.81 MIL/uL   Hemoglobin 10.1 (L) 13.0 - 17.0 g/dL   HCT 30.6 (L) 39.0 - 52.0 %   MCV 88.4 78.0 - 100.0 fL   MCH 29.2 26.0 - 34.0 pg   MCHC 33.0 30.0 - 36.0 g/dL   RDW 13.9 11.5 - 15.5 %   Platelets 179 150 - 400 K/uL  I-STAT 4, (NA,K, GLUC, HGB,HCT)   Collection Time: 07/17/16  6:02 PM  Result Value Ref Range   Sodium 144 135 - 145 mmol/L   Potassium 4.1 3.5 - 5.1 mmol/L   Glucose, Bld 138 (H) 65 - 99 mg/dL   HCT 27.0 (L) 39.0 - 52.0 %   Hemoglobin 9.2 (L) 13.0 - 17.0 g/dL  Results for orders placed or performed during the hospital encounter of 07/07/16 (from the past 8736 hour(s))  Surgical pcr screen   Collection Time: 07/07/16 11:30 AM  Result Value Ref Range   MRSA, PCR NEGATIVE NEGATIVE   Staphylococcus aureus NEGATIVE NEGATIVE  Basic metabolic panel   Collection Time: 07/07/16 11:30 AM  Result Value Ref Range   Sodium  141 135 - 145 mmol/L   Potassium 3.6 3.5 - 5.1 mmol/L   Chloride 107 101 - 111 mmol/L   CO2 26 22 - 32 mmol/L   Glucose, Bld 108 (H) 65 - 99 mg/dL   BUN 17 6 - 20 mg/dL   Creatinine, Ser 1.13 0.61 - 1.24 mg/dL   Calcium 8.9 8.9 - 10.3 mg/dL   GFR calc non Af Amer >60 >60 mL/min   GFR calc Af Amer >60 >60 mL/min   Anion gap 8 5 - 15  CBC   Collection Time: 07/07/16 11:30 AM  Result Value Ref Range   WBC 8.6 4.0 - 10.5 K/uL   RBC 4.91 4.22 - 5.81 MIL/uL   Hemoglobin 14.9 13.0 - 17.0 g/dL   HCT 45.0 39.0 - 52.0 %   MCV 91.6 78.0 - 100.0 fL   MCH 30.3 26.0 - 34.0 pg   MCHC 33.1 30.0 - 36.0 g/dL   RDW 14.3 11.5 - 15.5 %   Platelets 202 150 - 400 K/uL  Type and screen [place]   Collection Time: 07/07/16 11:40 AM  Result Value Ref Range   ABO/RH(D) O POS    Antibody Screen NEG    Sample Expiration 07/21/2016    Extend sample reason NO TRANSFUSIONS OR PREGNANCY IN THE PAST 3 MONTHS   ABO/Rh   Collection Time: 07/07/16 11:40 AM  Result Value Ref Range   ABO/RH(D) O POS   Results for orders placed or performed during the hospital encounter of 05/17/16 (from the past 8736 hour(s))  Basic metabolic panel   Collection Time: 05/17/16 10:50 AM  Result Value Ref Range   Sodium 138 135 - 145 mmol/L   Potassium 3.9 3.5 - 5.1 mmol/L   Chloride 106 101 - 111 mmol/L   CO2 26 22 - 32 mmol/L   Glucose, Bld 100 (H) 65 - 99 mg/dL   BUN 18 6 - 20 mg/dL   Creatinine, Ser 1.16 0.61 - 1.24 mg/dL   Calcium 9.1 8.9 - 10.3 mg/dL   GFR calc non Af  Amer >60 >60 mL/min   GFR calc Af Amer >60 >60 mL/min   Anion gap 6 5 - 15  CBC   Collection Time: 05/17/16 10:50 AM  Result Value Ref Range   WBC 9.8 4.0 - 10.5 K/uL   RBC 5.24 4.22 - 5.81 MIL/uL   Hemoglobin 15.4 13.0 - 17.0 g/dL   HCT 47.0 39.0 - 52.0 %   MCV 89.7 78.0 - 100.0 fL   MCH 29.4 26.0 - 34.0 pg   MCHC 32.8 30.0 - 36.0 g/dL   RDW 13.9 11.5 - 15.5 %   Platelets 206 150 - 400 K/uL  I-stat troponin, ED    Collection Time: 05/17/16 10:54 AM  Result Value Ref Range   Troponin i, poc 0.00 0.00 - 0.08 ng/mL   Comment 3          Urinalysis, Routine w reflex microscopic   Collection Time: 05/17/16 12:07 PM  Result Value Ref Range   Color, Urine YELLOW YELLOW   APPearance CLEAR CLEAR   Specific Gravity, Urine 1.020 1.005 - 1.030   pH 5.0 5.0 - 8.0   Glucose, UA NEGATIVE NEGATIVE mg/dL   Hgb urine dipstick MODERATE (A) NEGATIVE   Bilirubin Urine NEGATIVE NEGATIVE   Ketones, ur NEGATIVE NEGATIVE mg/dL   Protein, ur 30 (A) NEGATIVE mg/dL   Nitrite NEGATIVE NEGATIVE   Leukocytes, UA TRACE (A) NEGATIVE  Urine microscopic-add on   Collection Time: 05/17/16 12:07 PM  Result Value Ref Range   Squamous Epithelial / LPF 0-5 (A) NONE SEEN   WBC, UA 6-30 0 - 5 WBC/hpf   RBC / HPF 6-30 0 - 5 RBC/hpf   Bacteria, UA RARE (A) NONE SEEN   Casts HYALINE CASTS (A) NEGATIVE   Urine-Other MUCOUS PRESENT    Assessment Axis I Major depressive disorder, 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  I reviewed CC, tobacco/med/surg Hx, meds effects/ side effects, problem list, therapies and responses as well as current situation/symptoms discussed options. Patient will Continue Valium 5 mg a take it only at night. He will discontinue Lexapro and start Prozac 20 mg every morning. He'll return to see me in 4 weeks See orders and pt instructions for more details.  MEDICATIONS this encounter: Meds ordered this encounter  Medications  . FLUoxetine (PROZAC) 20 MG capsule    Sig: Take 1 capsule (20 mg total) by mouth daily.    Dispense:  30 capsule    Refill:  2    Medical Decision Making Problem Points:  Established problem, stable/improving (1), New problem, with no additional work-up planned (3), Review of last therapy session (1) and Review of psycho-social stressors (1) Data  Points:  Review or order clinical lab tests (1) Review of medication regiment & side effects (2) Review of new medications or change in dosage (2)  I certify that outpatient services furnished can reasonably be expected to improve the patient's condition.   [name], MD

## 2017-02-10 NOTE — Therapy (Signed)
[place], [place], [address] Phone: [phone]   Fax:  [phone]  Physical Therapy Treatment  Patient Details  Name: Zachary [name] MRN: [id number] Date of Birth: 14-Sep-1946 Referring Provider: Penni Bombard  Encounter Date: 02/10/2017      PT End of Session - 02/10/17 1455    Visit Number 4   Number of Visits 13   Date for PT Re-Evaluation 02/17/17   Authorization Type [employer]    Authorization Time Period 01/27/17 to 03/10/17   Authorization - Visit Number 4   Authorization - Number of Visits 10   PT Start Time 1346   PT Stop Time 1440   PT Time Calculation (min) 54 min   Activity Tolerance Patient tolerated treatment well   Behavior During Therapy [facility] for tasks assessed/performed      Past Medical History:  Diagnosis Date  . Anxiety    takes Valium as needed  . Arthritis   . Bladder cancer ([place])    takes Rapaflo daily  . Blood dyscrasia    07/08/16: pt told he was a "free bleeder" after bleeding a lot when derm cut off mole on forehead. No excessive bleeding with minor wounds at home, no bleeding problems perioperatively with prior surgeries.  . Chronic back pain    spondylolisthesis  . Cluster headaches   . Depression    takes Lexapro daily  . Essential hypertension, benign    takes Diovan-HCT daily  . Joint pain   . Obstructive sleep apnea on CPAP    uses CPAP @ night  . Pneumonia    "several times"--last time about 3-4 yrs ago  . PONV (postoperative nausea and vomiting)    Hx: of "sometimes"  . Prediabetes     Past Surgical History:  Procedure Laterality Date  . ANTERIOR CERVICAL DECOMP/DISCECTOMY FUSION N/A 06/05/2013   Procedure:  C3-4 Anterior Cervical Discectomy and Fusion, Allograft, Plate;  Surgeon: [name], MD;  Location: [place];  Service: Orthopedics;  Laterality: N/A;  C3-4 Anterior Cervical Discectomy and Fusion, Allograft, Plate  . Arthroscopic knee  surgery    . BACK SURGERY      x 2  . BLADDER SURGERY     x 5 to remove tumor  . CARPAL TUNNEL RELEASE    . CATARACT EXTRACTION W/PHACO  12/19/2012   Procedure: CATARACT EXTRACTION PHACO AND INTRAOCULAR LENS PLACEMENT (IOC);  Surgeon: [name], MD;  Location[facility];  Service: Ophthalmology;  Laterality: Left;  CDE: 26.80  . CATARACT EXTRACTION W/PHACO  01/09/2013   Procedure: CATARACT EXTRACTION PHACO AND INTRAOCULAR LENS PLACEMENT (IOC);  Surgeon: [name], MD;  Location: [facility];  Service: Ophthalmology;  Laterality: Right;  CDE:22.83  . CERVICAL FUSION  06/05/2013   C 3  C4  . CYSTOSCOPY    . REFRACTIVE SURGERY     Hx: of  . RETINAL DETACHMENT SURGERY    . TRANSURETHRAL RESECTION OF BLADDER TUMOR Right 07/27/2015   Procedure: TRANSURETHRAL RESECTION OF BLADDER TUMOR (TURBT);  Surgeon: [name], MD;  Location: [facility];  Service: Urology;  Laterality: Right;    There were no vitals filed for this visit.      Subjective Assessment - 02/10/17 1445    Subjective Pt reports that he felt good following last treatment session.   Pertinent History HTN, postural hypotension, dysphagia, CNS dysfunction, knee OA, HNP, spinal stenosis, history of bladder cancer, cervical fusion, history of multiple back surgeries  Patient Stated Goals walk better, prevent falls, get back to PLOF    Currently in Pain? No/denies                              Balance Exercises - 02/10/17 1446      Balance Exercises: Standing   Tandem Stance Eyes open;Foam/compliant surface;Intermittent upper extremity support;5 reps  FFE on dyna disc, trunk rotation with 4# weight bar, 2 sets   Rockerboard Anterior/posterior;Lateral;10 seconds;10 reps;Intermittent UE support;Other (comment)  dual task while maintaining balance in middle   Tandem Gait Forward;Retro;Upper extremity support;Intermittent upper extremity support;Foam/compliant surface;3 reps  intermittent UE for fwd, light UE  t/o retro   Sidestepping Foam/compliant support;Upper extremity support;2 reps  stepping over large hurdles, light UE support t/o   Other Standing Exercises NBOS and bil tandem stance on firm surface at numbers board x 8 reps each, increased difficulty with LLE back during tandem stance           PT Education - 02/10/17 1454    Education provided Yes   Education Details continue HEP and aerobic exercise; exercise technique   Person(s) Educated Patient   Methods Explanation;Demonstration   Comprehension Verbalized understanding;Returned demonstration          PT Short Term Goals - 01/27/17 1529      PT SHORT TERM GOAL #1   Title Patient to be able to identify 5/5 safety precautions/concepts in order to assist in reducing falls and improving general safety with mobility    Time 3   Period Weeks   Status New     PT SHORT TERM GOAL #2   Title Patient to show improved gait mechanics including increased BOS with gait, improved toe clearance, elimination of drift, improved heel-toe mechancis, and improved balance in order to reduce fall risk and improve efficiency of mobility    Time 3   Period Weeks   Status New     PT SHORT TERM GOAL #3   Title Patient to be able to perform regular TUG in 12 seconds or less and dual tasking TUG in 14 seconds or less in order to show improved balance and general mobiltiy    Time 3   Period Weeks   Status New     PT SHORT TERM GOAL #4   Title Patient to be able to perform PWR based HEP without pain exacerbation correctly and consistently in order to improve self-efficacy in managing condition    Time 2   Period Weeks   Status New           PT Long Term Goals - 01/27/17 1532      PT LONG TERM GOAL #1   Title Patient to demonstrate strength 5/5 in all tested muscle groups in order to facilitate improved mobility within community    Time 6   Period Weeks   Status New     PT LONG TERM GOAL #2   Title Patient to be able to ambulate  62ft during 3MWT in order to show improved efficiency of mobiltiy and reduced fall risk    Time 6   Period Weeks   Status New     PT LONG TERM GOAL #3   Title Patient to score at least 21/24 on DGI in order to show improved balance in dynamic scenarios and overall reduced fall risk    Time 6   Period Weeks   Status New  PT LONG TERM GOAL #4   Title Patient to be able to verbalize the importance of regular aerobic activity in managing Parkinsons Disease and will be participatory in regular aerobic exercise at least 20-30 minutes in duration and 4 days per week in order to assist in managing condition moving forward    Time 6   Period Weeks   Status New               Plan - 02/10/17 1455    Clinical Impression Statement Began pt on Nustep x 10 mins on level 5, maintaing RPM >60 (unbilled). Pt continuing to make progress towards goals. Dynamic balance and dual task were the focus of this treatment session which pt tolerated well. He had most difficulty with NBOS and shifting weight outside BOS. Pt required CGA - min A t/o session to maintain balance. Pt needs continued skilled PT intervention to maximize overall function and decrease risk of falls.    Rehab Potential Good   Clinical Impairments Affecting Rehab Potential (+) has had reasonable success with PT in the past, quickly referred to PT after diagnosis of Parkinsonism; (-) ongoing severe back pain, history of falling    PT Frequency 2x / week   PT Duration 6 weeks   PT Treatment/Interventions ADLs/Self Care Home Management;Biofeedback;DME Instruction;Gait training;Stair training;Functional mobility training;Therapeutic activities;Therapeutic exercise;Balance training;Neuromuscular re-education;Patient/family education;Manual techniques;Passive range of motion;Energy conservation;Taping   PT Next Visit Plan seated and standing PWR training; gait and balance training, trial treadmill . Update HEP    Consulted and Agree with  Plan of Care Patient      Patient will benefit from skilled therapeutic intervention in order to improve the following deficits and impairments:  Abnormal gait, Improper body mechanics, Pain, Decreased coordination, Decreased mobility, Postural dysfunction, Decreased activity tolerance, Decreased strength, Decreased balance, Decreased safety awareness, Difficulty walking, Impaired flexibility  Visit Diagnosis: Unsteadiness on feet  Other abnormalities of gait and mobility  Difficulty in walking, not elsewhere classified  Muscle weakness (generalized)  Other symptoms and signs involving the musculoskeletal system     Problem List Patient Active Problem List   Diagnosis Date Noted  . Postural hypotension 07/23/2016  . Dysphagia 07/23/2016  . Obstructive sleep apnea 07/21/2016  . Hyperglycemia 07/21/2016  . Anemia, unspecified 07/21/2016  . Spinal stenosis of lumbar region 07/15/2016  . Bladder tumor 07/27/2015  . HNP (herniated nucleus pulposus), cervical 06/05/2013    Class: Diagnosis of  . 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  . Muscle spasms of neck 04/04/2013  . 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 OA (osteoarthritis) of knee 02/22/2013  . Iliotibial band syndrome of left side 11/22/2012  . Localized, primary osteoarthritis of the ankle and foot 10/11/2012  . Difficulty in walking(719.7) 08/17/2012  . Peroneal tendinitis 08/16/2012  . Precordial pain 02/09/2012  . Essential hypertension, benign 02/09/2012  . Mixed hyperlipidemia 02/09/2012  . Dysthymia 02/04/2012  . Abnormality of gait 12/23/2011  . Lateral epicondylitis/tennis elbow 05/06/2011  . TENOSYNOVITIS OF FOOT AND ANKLE 10/22/2010    Geraldine Solar PT, DPT    [facility] [address] [place], [place], [address] Phone: [phone]   Fax:  [phone]  Name: Zachary [name] MRN: [id number] Date of Birth: 1946/03/03

## 2017-02-11 ENCOUNTER — Ambulatory Visit (HOSPITAL_COMMUNITY): Payer: Medicare HMO | Admitting: Physical Therapy

## 2017-02-15 ENCOUNTER — Ambulatory Visit (HOSPITAL_COMMUNITY): Payer: Medicare HMO | Admitting: Physical Therapy

## 2017-02-15 DIAGNOSIS — R2689 Other abnormalities of gait and mobility: Secondary | ICD-10-CM | POA: Diagnosis not present

## 2017-02-15 DIAGNOSIS — R2681 Unsteadiness on feet: Secondary | ICD-10-CM | POA: Diagnosis not present

## 2017-02-15 DIAGNOSIS — R262 Difficulty in walking, not elsewhere classified: Secondary | ICD-10-CM

## 2017-02-15 DIAGNOSIS — M6281 Muscle weakness (generalized): Secondary | ICD-10-CM | POA: Diagnosis not present

## 2017-02-15 DIAGNOSIS — R29898 Other symptoms and signs involving the musculoskeletal system: Secondary | ICD-10-CM | POA: Diagnosis not present

## 2017-02-15 NOTE — Therapy (Addendum)
[place], [place], [address] Phone: [phone]   Fax:  [phone]  Physical Therapy Treatment (Re-Assessment)  Patient Details  Name: Zachary [name] MRN: [id number] Date of Birth: 03-03-1946 Referring Provider: Penni Bombard  Encounter Date: Feb 21, 2017      PT End of Session - February 21, 2017 0904    Visit Number 5   Number of Visits 8   Date for PT Re-Evaluation 03/08/17   Authorization Type [employer] H[employer] (G-codes done 5th session on 02-21-2023)   Authorization Time Period 01/27/17 to 03/10/17   Authorization - Visit Number 5   Authorization - Number of Visits 15   PT Start Time 0816   PT Stop Time 0857   PT Time Calculation (min) 41 min   Activity Tolerance Patient tolerated treatment well   Behavior During Therapy [facility] for tasks assessed/performed      Past Medical History:  Diagnosis Date  . Anxiety    takes Valium as needed  . Arthritis   . Bladder cancer ([place])    takes Rapaflo daily  . Blood dyscrasia    07/08/16: pt told he was a "free bleeder" after bleeding a lot when derm cut off mole on forehead. No excessive bleeding with minor wounds at home, no bleeding problems perioperatively with prior surgeries.  . Chronic back pain    spondylolisthesis  . Cluster headaches   . Depression    takes Lexapro daily  . Essential hypertension, benign    takes Diovan-HCT daily  . Joint pain   . Obstructive sleep apnea on CPAP    uses CPAP @ night  . Pneumonia    "several times"--last time about 3-4 yrs ago  . PONV (postoperative nausea and vomiting)    Hx: of "sometimes"  . Prediabetes     Past Surgical History:  Procedure Laterality Date  . ANTERIOR CERVICAL DECOMP/DISCECTOMY FUSION N/A 06/05/2013   Procedure:  C3-4 Anterior Cervical Discectomy and Fusion, Allograft, Plate;  Surgeon: [name], MD;  Location: [facility];  Service: Orthopedics;  Laterality: N/A;  C3-4 Anterior Cervical Discectomy and  Fusion, Allograft, Plate  . Arthroscopic knee surgery    . BACK SURGERY      x 2  . BLADDER SURGERY     x 5 to remove tumor  . CARPAL TUNNEL RELEASE    . CATARACT EXTRACTION W/PHACO  12/19/2012   Procedure: CATARACT EXTRACTION PHACO AND INTRAOCULAR LENS PLACEMENT (IOC);  Surgeon: [name], MD;  [facility];  Service: Ophthalmology;  Laterality: Left;  CDE: 26.80  . CATARACT EXTRACTION W/PHACO  01/09/2013   Procedure: CATARACT EXTRACTION PHACO AND INTRAOCULAR LENS PLACEMENT (IOC);  Surgeon: [name], MD;  Location: [facility];  Service: Ophthalmology;  Laterality: Right;  CDE:22.83  . CERVICAL FUSION  06/05/2013   C 3  C4  . CYSTOSCOPY    . REFRACTIVE SURGERY     Hx: of  . RETINAL DETACHMENT SURGERY    . TRANSURETHRAL RESECTION OF BLADDER TUMOR Right 07/27/2015   Procedure: TRANSURETHRAL RESECTION OF BLADDER TUMOR (TURBT);  Surgeon: [name], MD;  [facility];  Service: Urology;  Laterality: Right;    There were no vitals filed for this visit.      Subjective Assessment - February 21, 2017 0819    Subjective Patient arrives today stating that he is getting better in terms of his parkinsons; his left knee continues to bother him however. He was able to work in the yard  this weekend and has been compliant with exercises. Balance is still hard for him. No falls or close calls.     Pertinent History HTN, postural hypotension, dysphagia, CNS dysfunction, knee OA, HNP, spinal stenosis, history of bladder cancer, cervical fusion, history of multiple back surgeries    Patient Stated Goals walk better, prevent falls, get back to PLOF    Currently in Pain? No/denies            [facility] PT Assessment - 02/15/17 0001      Strength   Right Hip Flexion 4+/5   Right Hip ABduction 4+/5   Left Hip Flexion 4+/5   Left Hip ABduction 4+/5   Right Knee Flexion 4+/5   Right Knee Extension 5/5   Left Knee Flexion 4+/5   Left Knee Extension 5/5   Right Ankle Dorsiflexion 5/5   Left Ankle  Dorsiflexion 5/5     6 minute walk test results    Aerobic Endurance Distance Walked 648   Endurance additional comments 3MWT     Dynamic Gait Index   Level Surface Normal   Change in Gait Speed Mild Impairment   Gait with Horizontal Head Turns Mild Impairment   Gait with Vertical Head Turns Mild Impairment   Gait and Pivot Turn Mild Impairment   Step Over Obstacle Mild Impairment   Step Around Obstacles Mild Impairment   Steps Normal   Total Score 18     High Level Balance   High Level Balance Comments TUG 9.2 standard, dual tasking 10.35                         PWR [facility]) - 02/15/17 0902    Comments boom whacker PWR rock/twist combo with boom whackers; solid surface progressing to narrow BOS, then tandem stance. Static then moving ball tosses with 2 balls, forwards and backwards gait for dual tasking/cognitive exercise             PT Education - 02/15/17 0903    Education provided Yes   Education Details progress with skilled PT services, POC moving forward; discussed starfish stretch and tandem stance   Person(s) Educated Patient   Methods Explanation;Demonstration   Comprehension Verbalized understanding;Need further instruction          PT Short Term Goals - 02/15/17 0836      PT SHORT TERM GOAL #1   Title Patient to be able to identify 5/5 safety precautions/concepts in order to assist in reducing falls and improving general safety with mobility    Baseline 2/19- able to name 2/5, requires cues for the rest    Time 3   Period Weeks   Status On-going     PT SHORT TERM GOAL #2   Title Patient to show improved gait mechanics including increased BOS with gait, improved toe clearance, elimination of drift, improved heel-toe mechancis, and improved balance in order to reduce fall risk and improve efficiency of mobility    Baseline 2/19- improved    Time 3   Period Weeks   Status Partially Met     PT SHORT TERM GOAL #3   Title Patient to be  able to perform regular TUG in 12 seconds or less and dual tasking TUG in 14 seconds or less in order to show improved balance and general mobiltiy    Baseline 2/19- standard 9.19,  dual tasking 10.35   Time 3   Period Weeks   Status Achieved  PT SHORT TERM GOAL #4   Title Patient to be able to perform PWR based HEP without pain exacerbation correctly and consistently in order to improve self-efficacy in managing condition    Time 2   Period Weeks   Status Achieved           PT Long Term Goals - 02/15/17 0840      PT LONG TERM GOAL #1   Title Patient to demonstrate strength 5/5 in all tested muscle groups in order to facilitate improved mobility within community    Time 6   Period Weeks   Status Partially Met     PT LONG TERM GOAL #2   Title Patient to be able to ambulate 672f during 3MWT in order to show improved efficiency of mobiltiy and reduced fall risk    Baseline 2/19- 648f   Time 6   Period Weeks     PT LONG TERM GOAL #3   Title Patient to score at least 21/24 on DGI in order to show improved balance in dynamic scenarios and overall reduced fall risk    Baseline 2/19- 18   Time 6   Period Weeks   Status On-going     PT LONG TERM GOAL #4   Title Patient to be able to verbalize the importance of regular aerobic activity in managing Parkinsons Disease and will be participatory in regular aerobic exercise at least 20-30 minutes in duration and 4 days per week in order to assist in managing condition moving forward    Baseline 2/19- states verbal awareness, has not started  regular exercise program yet    Time 6   Period Weeks   Status On-going               Plan - 02/15/17 0907    Clinical Impression Statement Re-assessment performed today. Patient appears to be making excellent progress with skilled PT services, showing objective improvements on all fronts however does continue to demonstrate difficulty with dynamic balance in particular, also does  require cuing to identify safety concepts to reduce fall risk today. Patient appears pleased with his progress and reports he has been able to perform more things at e including quite a bit of yardwork the other day. Recommend short extension (3-4 more sessions) of skilled PT services in order to continue working on dynamic balance, dual tasking, and general safety awareness before possible DC to independent aerobic exercise program.    Rehab Potential Good   Clinical Impairments Affecting Rehab Potential (+) has had reasonable success with PT in the past, quickly referred to PT after diagnosis of Parkinsonism; (-) ongoing severe back pain, history of falling    PT Frequency 1x / week   PT Duration Other (comment)  3-4 weeks   PT Treatment/Interventions ADLs/Self Care Home Management;Biofeedback;DME Instruction;Gait training;Stair training;Functional mobility training;Therapeutic activities;Therapeutic exercise;Balance training;Neuromuscular re-education;Patient/family education;Manual techniques;Passive range of motion;Energy conservation;Taping   PT Next Visit Plan quadruped PWR, floor to stand; dynamic balance/dual tasking activities. Update HEP.    Consulted and Agree with Plan of Care Patient      Patient will benefit from skilled therapeutic intervention in order to improve the following deficits and impairments:  Abnormal gait, Improper body mechanics, Pain, Decreased coordination, Decreased mobility, Postural dysfunction, Decreased activity tolerance, Decreased strength, Decreased balance, Decreased safety awareness, Difficulty walking, Impaired flexibility  Visit Diagnosis: Unsteadiness on feet  Other abnormalities of gait and mobility  Difficulty in walking, not elsewhere classified  Muscle weakness (generalized)  G-Codes - 02/15/17 1534    Functional Assessment Tool Used Clinical judgement   Functional Assessment Tool Used Based on skilled clinical assessment of gait,  strength, balance, freezing episodes, bradykinesia, fall risk    Functional Limitation Mobility: Walking and moving around   Mobility: Walking and Moving Around Current Status [phone]) At least 20 percent but less than 40 percent impaired, limited or restricted   Mobility: Walking and Moving Around Goal Status [phone]) At least 1 percent but less than 20 percent impaired, limited or restricted      Problem List Patient Active Problem List   Diagnosis Date Noted  . Postural hypotension 07/23/2016  . Dysphagia 07/23/2016  . Obstructive sleep apnea 07/21/2016  . Hyperglycemia 07/21/2016  . Anemia, unspecified 07/21/2016  . Spinal stenosis of lumbar region 07/15/2016  . Bladder tumor 07/27/2015  . HNP (herniated nucleus pulposus), cervical 06/05/2013    Class: Diagnosis of  . 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  . Muscle spasms of neck 04/04/2013  . 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 OA (osteoarthritis) of knee 02/22/2013  . Iliotibial band syndrome of left side 11/22/2012  . Localized, primary osteoarthritis of the ankle and foot 10/11/2012  . Difficulty in walking(719.7) 08/17/2012  . Peroneal tendinitis 08/16/2012  . Precordial pain 02/09/2012  . Essential hypertension, benign 02/09/2012  . Mixed hyperlipidemia 02/09/2012  . Dysthymia 02/04/2012  . Abnormality of gait 12/23/2011  . Lateral epicondylitis/tennis elbow 05/06/2011  . TENOSYNOVITIS OF FOOT AND ANKLE 10/22/2010    Deniece Ree PT, DPT [phone]  Addendum 2/19: added clinical assessment section to G-codes. Deniece Ree PT, DPT [phone]   [facility] [address] [place], [place], [address] Phone: [phone]   Fax:  [phone]  Name: Zachary [name] MRN: [id number] Date of Birth: 08-22-1946

## 2017-02-16 DIAGNOSIS — H35373 Puckering of macula, bilateral: Secondary | ICD-10-CM | POA: Diagnosis not present

## 2017-02-17 ENCOUNTER — Ambulatory Visit (HOSPITAL_COMMUNITY): Payer: Medicare HMO | Admitting: Physical Therapy

## 2017-02-22 ENCOUNTER — Ambulatory Visit (HOSPITAL_COMMUNITY): Payer: Medicare HMO | Admitting: Physical Therapy

## 2017-02-22 DIAGNOSIS — R2681 Unsteadiness on feet: Secondary | ICD-10-CM

## 2017-02-22 DIAGNOSIS — M6281 Muscle weakness (generalized): Secondary | ICD-10-CM | POA: Diagnosis not present

## 2017-02-22 DIAGNOSIS — R262 Difficulty in walking, not elsewhere classified: Secondary | ICD-10-CM

## 2017-02-22 DIAGNOSIS — R2689 Other abnormalities of gait and mobility: Secondary | ICD-10-CM | POA: Diagnosis not present

## 2017-02-22 DIAGNOSIS — R29898 Other symptoms and signs involving the musculoskeletal system: Secondary | ICD-10-CM | POA: Diagnosis not present

## 2017-02-22 NOTE — Therapy (Signed)
[place], [place], [address] Phone: [phone]   Fax:  [phone]  Physical Therapy Treatment  Patient Details  Name: Zachary [name] MRN: [id number] Date of Birth: [birth year] Referring Provider: Penni Bombard  Encounter Date: 02/22/2017      PT End of Session - 02/22/17 0903    Visit Number 6   Number of Visits 8   Date for PT Re-Evaluation 03/08/17   Authorization Type [employer] H[employer] (G-codes done 5th session on 05-Mar-2023)   Authorization Time Period 01/27/17 to 03/10/17   Authorization - Visit Number 6   Authorization - Number of Visits 15   PT Start Time 0825  started session on Nustep, not included in billing    PT Stop Time 0900   PT Time Calculation (min) 35 min   Equipment Utilized During Treatment Gait belt   Activity Tolerance Patient tolerated treatment well   Behavior During Therapy [facility] for tasks assessed/performed      Past Medical History:  Diagnosis Date  . Anxiety    takes Valium as needed  . Arthritis   . Bladder cancer ([place])    takes Rapaflo daily  . Blood dyscrasia    07/08/16: pt told he was a "free bleeder" after bleeding a lot when derm cut off mole on forehead. No excessive bleeding with minor wounds at home, no bleeding problems perioperatively with prior surgeries.  . Chronic back pain    spondylolisthesis  . Cluster headaches   . Depression    takes Lexapro daily  . Essential hypertension, benign    takes Diovan-HCT daily  . Joint pain   . Obstructive sleep apnea on CPAP    uses CPAP @ night  . Pneumonia    "several times"--last time about 3-4 yrs ago  . PONV (postoperative nausea and vomiting)    Hx: of "sometimes"  . Prediabetes     Past Surgical History:  Procedure Laterality Date  . ANTERIOR CERVICAL DECOMP/DISCECTOMY FUSION N/A 06/05/2013   Procedure:  C3-4 Anterior Cervical Discectomy and Fusion, Allograft, Plate;  Surgeon: [name], MD;  Location: [place];  Service: Orthopedics;  Laterality: N/A;  C3-4 Anterior Cervical Discectomy and Fusion, Allograft, Plate  . Arthroscopic knee surgery    . BACK SURGERY      x 2  . BLADDER SURGERY     x 5 to remove tumor  . CARPAL TUNNEL RELEASE    . CATARACT EXTRACTION W/PHACO  12/19/2012   Procedure: CATARACT EXTRACTION PHACO AND INTRAOCULAR LENS PLACEMENT (IOC);  Surgeon: [name], MD;  [facility];  Service: Ophthalmology;  Laterality: Left;  CDE: 26.80  . CATARACT EXTRACTION W/PHACO  01/09/2013   Procedure: CATARACT EXTRACTION PHACO AND INTRAOCULAR LENS PLACEMENT (IOC);  Surgeon: [name], MD;  Location[facility];  Service: Ophthalmology;  Laterality: Right;  CDE:22.83  . CERVICAL FUSION  06/05/2013   C 3  C4  . CYSTOSCOPY    . REFRACTIVE SURGERY     Hx: of  . RETINAL DETACHMENT SURGERY    . TRANSURETHRAL RESECTION OF BLADDER TUMOR Right 07/27/2015   Procedure: TRANSURETHRAL RESECTION OF BLADDER TUMOR (TURBT);  Surgeon: [name], MD;  [facility];  Service: Urology;  Laterality: Right;    There were no vitals filed for this visit.      Subjective Assessment - 02/22/17 0821    Subjective patient arrives today reporting he is doing well, he had to push himself  this weekend in terms of yardwork- it all went well with no major complaints noted    Pertinent History HTN, postural hypotension, dysphagia, CNS dysfunction, knee OA, HNP, spinal stenosis, history of bladder cancer, cervical fusion, history of multiple back surgeries    Patient Stated Goals walk better, prevent falls, get back to PLOF    Currently in Pain? No/denies                         [facility] Adult PT Treatment/Exercise - 02/22/17 0001      Knee/Hip Exercises: Aerobic   Nustep level 4, hills 4 x10 minutes at 60SPM minimum   not included in billing            [facility]) - 02/22/17 0827    PWR! Up 1x10, ball, modified quad    PWR! [address], cones, modified quad   [place]! Twist 4x3, cones,  modified quad    PWR! Step 2x5, modified quad, high low table    Comments min-mod cues; hands on dynadiscs PRN    Comments boom whacker activities in standing- side stepping with cross midline reaches; dynamic boom whacker sequencing drill with various combinations, all min guard              PT Education - 02/22/17 0902    Education provided Yes   Education Details build habit of going to regular aerobic exercise over next 2 weeks- this is one of the most important things he can do to manage his condition    Person(s) Educated Patient   Methods Explanation   Comprehension Verbalized understanding          PT Short Term Goals - 02/15/17 0836      PT SHORT TERM GOAL #1   Title Patient to be able to identify 5/5 safety precautions/concepts in order to assist in reducing falls and improving general safety with mobility    Baseline 2/19- able to name 2/5, requires cues for the rest    Time 3   Period Weeks   Status On-going     PT SHORT TERM GOAL #2   Title Patient to show improved gait mechanics including increased BOS with gait, improved toe clearance, elimination of drift, improved heel-toe mechancis, and improved balance in order to reduce fall risk and improve efficiency of mobility    Baseline 2/19- improved    Time 3   Period Weeks   Status Partially Met     PT SHORT TERM GOAL #3   Title Patient to be able to perform regular TUG in 12 seconds or less and dual tasking TUG in 14 seconds or less in order to show improved balance and general mobiltiy    Baseline 2/19- standard 9.19,  dual tasking 10.35   Time 3   Period Weeks   Status Achieved     PT SHORT TERM GOAL #4   Title Patient to be able to perform PWR based HEP without pain exacerbation correctly and consistently in order to improve self-efficacy in managing condition    Time 2   Period Weeks   Status Achieved           PT Long Term Goals - 02/15/17 0840      PT LONG TERM GOAL #1   Title Patient to  demonstrate strength 5/5 in all tested muscle groups in order to facilitate improved mobility within community    Time 6   Period Weeks   Status Partially  Met     PT LONG TERM GOAL #2   Title Patient to be able to ambulate 663f during 3MWT in order to show improved efficiency of mobiltiy and reduced fall risk    Baseline 2/19- 6484f   Time 6   Period Weeks     PT LONG TERM GOAL #3   Title Patient to score at least 21/24 on DGI in order to show improved balance in dynamic scenarios and overall reduced fall risk    Baseline 2/19- 18   Time 6   Period Weeks   Status On-going     PT LONG TERM GOAL #4   Title Patient to be able to verbalize the importance of regular aerobic activity in managing Parkinsons Disease and will be participatory in regular aerobic exercise at least 20-30 minutes in duration and 4 days per week in order to assist in managing condition moving forward    Baseline 2/19- states verbal awareness, has not started  regular exercise program yet    Time 6   Period Weeks   Status On-going               Plan - 02/22/17 0943    Clinical Impression Statement Began session on Nustep (not included in billing), with progression of machine settings as appropriate. Otherwise focused on quadruped PWR today followed by dynamic balance training with intermittent dual tasking training as indicated in general POC. Difficulty noted with thoracic rotation/spine mobility in general today. Patient continues to do very well in general with skilled PT services, however continues to need encouragement regarding initiating an independent exercise program as he has yet to start this yet.    Rehab Potential Good   Clinical Impairments Affecting Rehab Potential (+) has had reasonable success with PT in the past, quickly referred to PT after diagnosis of Parkinsonism; (-) ongoing severe back pain, history of falling    PT Frequency 1x / week   PT Duration Other (comment)  3-4 weeks    PT  Treatment/Interventions ADLs/Self Care Home Management;Biofeedback;DME Instruction;Gait training;Stair training;Functional mobility training;Therapeutic activities;Therapeutic exercise;Balance training;Neuromuscular re-education;Patient/family education;Manual techniques;Passive range of motion;Energy conservation;Taping   PT Next Visit Plan quadruped PWR, floor to stand; dynamic balance/dual tasking activities. Update HEP.    Consulted and Agree with Plan of Care Patient      Patient will benefit from skilled therapeutic intervention in order to improve the following deficits and impairments:  Abnormal gait, Improper body mechanics, Pain, Decreased coordination, Decreased mobility, Postural dysfunction, Decreased activity tolerance, Decreased strength, Decreased balance, Decreased safety awareness, Difficulty walking, Impaired flexibility  Visit Diagnosis: Unsteadiness on feet  Other abnormalities of gait and mobility  Difficulty in walking, not elsewhere classified  Muscle weakness (generalized)     Problem List Patient Active Problem List   Diagnosis Date Noted  . Postural hypotension 07/23/2016  . Dysphagia 07/23/2016  . Obstructive sleep apnea 07/21/2016  . Hyperglycemia 07/21/2016  . Anemia, unspecified 07/21/2016  . Spinal stenosis of lumbar region 07/15/2016  . Bladder tumor 07/27/2015  . HNP (herniated nucleus pulposus), cervical 06/05/2013    Class: Diagnosis of  . 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  . Muscle spasms of neck 04/04/2013  . 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 OA (osteoarthritis) of knee 02/22/2013  . Iliotibial band syndrome of left side 11/22/2012  . Localized, primary osteoarthritis of the ankle and foot 10/11/2012  . Difficulty in walking(719.7) 08/17/2012  . Peroneal tendinitis 08/16/2012  . Precordial pain 02/09/2012  . Essential  hypertension, benign 02/09/2012  . Mixed hyperlipidemia 02/09/2012  . Dysthymia 02/04/2012  .  Abnormality of gait 12/23/2011  . Lateral epicondylitis/tennis elbow 05/06/2011  . TENOSYNOVITIS OF FOOT AND ANKLE 10/22/2010    Deniece Ree PT, DPT [place] [address] [place], [place], [address] Phone: [phone]   Fax:  [phone]  Name: Zachary [name] MRN: [id number] Date of Birth: 1946/04/19

## 2017-02-24 ENCOUNTER — Ambulatory Visit (HOSPITAL_COMMUNITY): Payer: Medicare HMO | Admitting: Physical Therapy

## 2017-03-01 ENCOUNTER — Ambulatory Visit (HOSPITAL_COMMUNITY): Payer: Self-pay | Admitting: Physical Therapy

## 2017-03-03 ENCOUNTER — Ambulatory Visit (HOSPITAL_COMMUNITY): Payer: Self-pay | Admitting: Physical Therapy

## 2017-03-08 ENCOUNTER — Ambulatory Visit (HOSPITAL_COMMUNITY): Payer: Medicare HMO | Attending: Diagnostic Neuroimaging | Admitting: Physical Therapy

## 2017-03-08 DIAGNOSIS — R2689 Other abnormalities of gait and mobility: Secondary | ICD-10-CM

## 2017-03-08 DIAGNOSIS — M6281 Muscle weakness (generalized): Secondary | ICD-10-CM | POA: Insufficient documentation

## 2017-03-08 DIAGNOSIS — R2681 Unsteadiness on feet: Secondary | ICD-10-CM | POA: Diagnosis not present

## 2017-03-08 DIAGNOSIS — R262 Difficulty in walking, not elsewhere classified: Secondary | ICD-10-CM | POA: Insufficient documentation

## 2017-03-08 NOTE — Therapy (Signed)
[place], [place], [address] Phone: [phone]   Fax:  [phone]  Physical Therapy Treatment  Patient Details  Name: Zachary [name] MRN: [id number] Date of Birth: 26-Mar-1946 Referring Provider: Penni Bombard  Encounter Date: 03/08/2017      PT End of Session - 03/08/17 1035    Visit Number 7   Number of Visits 8   Date for PT Re-Evaluation 03/15/17   Authorization Type [employer] H[employer] (G-codes done 5th session on Feb 21, 2023)   Authorization Time Period 01/27/17 to 03/10/17   Authorization - Visit Number 7   Authorization - Number of Visits 15   PT Start Time 0955   PT Stop Time 1031   PT Time Calculation (min) 36 min   Equipment Utilized During Treatment Gait belt   Activity Tolerance Patient tolerated treatment well   Behavior During Therapy [facility] for tasks assessed/performed      Past Medical History:  Diagnosis Date  . Anxiety    takes Valium as needed  . Arthritis   . Bladder cancer ([place])    takes Rapaflo daily  . Blood dyscrasia    07/08/16: pt told he was a "free bleeder" after bleeding a lot when derm cut off mole on forehead. No excessive bleeding with minor wounds at home, no bleeding problems perioperatively with prior surgeries.  . Chronic back pain    spondylolisthesis  . Cluster headaches   . Depression    takes Lexapro daily  . Essential hypertension, benign    takes Diovan-HCT daily  . Joint pain   . Obstructive sleep apnea on CPAP    uses CPAP @ night  . Pneumonia    "several times"--last time about 3-4 yrs ago  . PONV (postoperative nausea and vomiting)    Hx: of "sometimes"  . Prediabetes     Past Surgical History:  Procedure Laterality Date  . ANTERIOR CERVICAL DECOMP/DISCECTOMY FUSION N/A 06/05/2013   Procedure:  C3-4 Anterior Cervical Discectomy and Fusion, Allograft, Plate;  Surgeon: [name], MD;  Location: [place];  Service: Orthopedics;  Laterality: N/A;  C3-4  Anterior Cervical Discectomy and Fusion, Allograft, Plate  . Arthroscopic knee surgery    . BACK SURGERY      x 2  . BLADDER SURGERY     x 5 to remove tumor  . CARPAL TUNNEL RELEASE    . CATARACT EXTRACTION W/PHACO  12/19/2012   Procedure: CATARACT EXTRACTION PHACO AND INTRAOCULAR LENS PLACEMENT (IOC);  Surgeon: [name], MD;  [facility];  Service: Ophthalmology;  Laterality: Left;  CDE: 26.80  . CATARACT EXTRACTION W/PHACO  01/09/2013   Procedure: CATARACT EXTRACTION PHACO AND INTRAOCULAR LENS PLACEMENT (IOC);  Surgeon: [name], MD;  [facility];  Service: Ophthalmology;  Laterality: Right;  CDE:22.83  . CERVICAL FUSION  06/05/2013   C 3  C4  . CYSTOSCOPY    . REFRACTIVE SURGERY     Hx: of  . RETINAL DETACHMENT SURGERY    . TRANSURETHRAL RESECTION OF BLADDER TUMOR Right 07/27/2015   Procedure: TRANSURETHRAL RESECTION OF BLADDER TUMOR (TURBT);  Surgeon: [name], MD;  [facility];  Service: Urology;  Laterality: Right;    There were no vitals filed for this visit.      Subjective Assessment - 03/08/17 1000    Subjective Patient arrives today reporting that he is doing well, he has not had a chance to get to the gym due to being  very busy taking care of his mother but has been keeping active otherwise. He feels like he still needs to work on balance and taking bigger steps walking.     Pertinent History HTN, postural hypotension, dysphagia, CNS dysfunction, knee OA, HNP, spinal stenosis, history of bladder cancer, cervical fusion, history of multiple back surgeries    Patient Stated Goals walk better, prevent falls, get back to PLOF    Currently in Pain? No/denies                         [facility] Adult PT Treatment/Exercise - 03/08/17 0001      Ambulation/Gait   Gait Comments forward and backwards large steps; forwards/backwards high knees with min guard-min assist; gait with emphasis on long steps 2x260f      Knee/Hip Exercises: Aerobic    Nustep level 4, hills x10 mintes at 60-70 SPM   not included in billing            PWR ([facility] - 03/08/17 1034    PWR! Step 1x10, modified at mat table    Comments min cues    Comments sit to stand with juggling scarf toss 1x10             PT Education - 03/08/17 1035    Education provided Yes   Education Details re-assess next session, DPT autonomy, importance of regular aerobic exercise versus just being active with tasks through his day    Person(s) Educated Patient   Methods Explanation   Comprehension Verbalized understanding          PT Short Term Goals - 02/15/17 0836      PT SHORT TERM GOAL #1   Title Patient to be able to identify 5/5 safety precautions/concepts in order to assist in reducing falls and improving general safety with mobility    Baseline 2/19- able to name 2/5, requires cues for the rest    Time 3   Period Weeks   Status On-going     PT SHORT TERM GOAL #2   Title Patient to show improved gait mechanics including increased BOS with gait, improved toe clearance, elimination of drift, improved heel-toe mechancis, and improved balance in order to reduce fall risk and improve efficiency of mobility    Baseline 2/19- improved    Time 3   Period Weeks   Status Partially Met     PT SHORT TERM GOAL #3   Title Patient to be able to perform regular TUG in 12 seconds or less and dual tasking TUG in 14 seconds or less in order to show improved balance and general mobiltiy    Baseline 2/19- standard 9.19,  dual tasking 10.35   Time 3   Period Weeks   Status Achieved     PT SHORT TERM GOAL #4   Title Patient to be able to perform PWR based HEP without pain exacerbation correctly and consistently in order to improve self-efficacy in managing condition    Time 2   Period Weeks   Status Achieved           PT Long Term Goals - 02/15/17 0840      PT LONG TERM GOAL #1   Title Patient to demonstrate strength 5/5 in all tested muscle groups in  order to facilitate improved mobility within community    Time 6   Period Weeks   Status Partially Met     PT LONG TERM GOAL #2  Title Patient to be able to ambulate 67f during 3MWT in order to show improved efficiency of mobiltiy and reduced fall risk    Baseline 2/19- 6424f   Time 6   Period Weeks     PT LONG TERM GOAL #3   Title Patient to score at least 21/24 on DGI in order to show improved balance in dynamic scenarios and overall reduced fall risk    Baseline 2/19- 18   Time 6   Period Weeks   Status On-going     PT LONG TERM GOAL #4   Title Patient to be able to verbalize the importance of regular aerobic activity in managing Parkinsons Disease and will be participatory in regular aerobic exercise at least 20-30 minutes in duration and 4 days per week in order to assist in managing condition moving forward    Baseline 2/19- states verbal awareness, has not started  regular exercise program yet    Time 6   Period Weeks   Status On-going               Plan - 03/08/17 1036    Clinical Impression Statement Began session on Nustep today, not included in billing. Otherwise focused this session primarily on gait/balance via multiple stepping drills forwards/backwards with cues for form, step length/size, posture. Min guard-min assist provided throughout these exercises. Also performed PWR UP quadruped as well as sit to stand with juggling scarf. Patient still has not established regular exercise program on his own due to illness in the family. Recommend re-assessment next session.     Rehab Potential Good   Clinical Impairments Affecting Rehab Potential (+) has had reasonable success with PT in the past, quickly referred to PT after diagnosis of Parkinsonism; (-) ongoing severe back pain, history of falling    PT Frequency 1x / week   PT Duration Other (comment)  3-4 weeks    PT Treatment/Interventions ADLs/Self Care Home Management;Biofeedback;DME Instruction;Gait  training;Stair training;Functional mobility training;Therapeutic activities;Therapeutic exercise;Balance training;Neuromuscular re-education;Patient/family education;Manual techniques;Passive range of motion;Energy conservation;Taping   PT Next Visit Plan re-asssessment    Consulted and Agree with Plan of Care Patient      Patient will benefit from skilled therapeutic intervention in order to improve the following deficits and impairments:  Abnormal gait, Improper body mechanics, Pain, Decreased coordination, Decreased mobility, Postural dysfunction, Decreased activity tolerance, Decreased strength, Decreased balance, Decreased safety awareness, Difficulty walking, Impaired flexibility  Visit Diagnosis: Unsteadiness on feet  Other abnormalities of gait and mobility  Difficulty in walking, not elsewhere classified  Muscle weakness (generalized)     Problem List Patient Active Problem List   Diagnosis Date Noted  . Postural hypotension 07/23/2016  . Dysphagia 07/23/2016  . Obstructive sleep apnea 07/21/2016  . Hyperglycemia 07/21/2016  . Anemia, unspecified 07/21/2016  . Spinal stenosis of lumbar region 07/15/2016  . Bladder tumor 07/27/2015  . HNP (herniated nucleus pulposus), cervical 06/05/2013    Class: Diagnosis of  . 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  . Muscle spasms of neck 04/04/2013  . 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 OA (osteoarthritis) of knee 02/22/2013  . Iliotibial band syndrome of left side 11/22/2012  . Localized, primary osteoarthritis of the ankle and foot 10/11/2012  . Difficulty in walking(719.7) 08/17/2012  . Peroneal tendinitis 08/16/2012  . Precordial pain 02/09/2012  . Essential hypertension, benign 02/09/2012  . Mixed hyperlipidemia 02/09/2012  . Dysthymia 02/04/2012  . Abnormality of gait 12/23/2011  . Lateral epicondylitis/tennis elbow 05/06/2011  . TENOSYNOVITIS  OF FOOT AND ANKLE 10/22/2010    Deniece Ree PT,  DPT [place] [address] [place], [place], [address] Phone: [phone]   Fax:  [phone]  Name: Zachary [name] MRN: [id number] Date of Birth: 1946-02-06

## 2017-03-10 ENCOUNTER — Ambulatory Visit (INDEPENDENT_AMBULATORY_CARE_PROVIDER_SITE_OTHER): Payer: Medicare HMO | Admitting: Psychiatry

## 2017-03-10 ENCOUNTER — Encounter (HOSPITAL_COMMUNITY): Payer: Self-pay | Admitting: Psychiatry

## 2017-03-10 VITALS — BP 141/90 | HR 72 | Ht 72.0 in | Wt 227.6 lb

## 2017-03-10 DIAGNOSIS — F331 Major depressive disorder, recurrent, moderate: Secondary | ICD-10-CM | POA: Diagnosis not present

## 2017-03-10 MED ORDER — DULOXETINE HCL 60 MG PO CPEP: 60.0000 mg | ORAL_CAPSULE | Freq: Every day | ORAL | 2 refills | Status: DC

## 2017-03-10 NOTE — Progress Notes (Signed)
Patient ID: DINA MOBLEY, male   DOB: 1946/02/19, 71 y.o.   MRN: [id number] Patient ID: TADEN WITTER, male   DOB: Aug 30, 1946, 71 y.o.   MRN: [id number] Patient ID: KENDEL BESSEY, male   DOB: 11/27/1946, 71 y.o.   MRN: [id number] Patient ID: YOSHUA GEISINGER, male   DOB: [birth year], 71 y.o.   MRN: [id number] Patient ID: DOLTON SHAKER, male   DOB: 1946-09-03, 71 y.o.   MRN: [id number] Patient ID: SHERI PROWS, male   DOB: 1946-12-11, 71 y.o.   MRN: [id number] Patient ID: CLAIBORNE STROBLE, male   DOB: February 14, 1946, 71 y.o.   MRN: [id number] Patient ID: JAVARIUS TSOSIE, male   DOB: 1946/04/08, 71 y.o.   MRN: [id number] Patient ID: SARAH ZERBY, male   DOB: [birth year], 71 y.o.   MRN: [id number] Patient ID: FIELDS OROS, male   DOB: 1946/09/25, 71 y.o.   MRN: [id number] Patient ID: CODA MATHEY, male   DOB: [birth year], 71 y.o.   MRN: [id number] [facility] [id number] Progress Note DANIELLE LENTO MRN: [id number] DOB: 1946-04-20 Age: 71 y.o.  Date: 03/10/2017 Start Time: 3:10 PM End Time: 3:33 PM  Chief Complaint: Chief Complaint  Patient presents with  . Depression  . Anxiety  . Follow-up   Subjective: "I'm not much better"  This patient is a 71 year-old married white male who lives with his wife in [place] they have no children. 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  The patient states he got depressed several years ago after his diagnosed with stage IV bladder cancer. He was seeing a psychologist here to deal with this and last year was placed on Lexapro. His cancer is gone now after 5 surgeries. He recently had back surgery as well but is recovered from this with no pain. He states that his mood is been great. He's excited about the football season. He is sleeping well and his energy is good  The patient returns after 4 weeks. Last time he stated that he had  no energy motivation and didn't feel like doing anything and his wife concurred. They are back again today and he states not much is difference since we switch to Prozac. He does a little bit in the mornings and then gets droopy in the afternoons and just wants to sleep. He's also been diagnosed with Parkinson's disease and this may have something to do with his bradykinesia. He is worried about his mother who is 25 and in a nursing home and probably on her last days of life. He looked like he was about to cry and then stopped himself. He seemed like he had done better a few months ago on Cymbalta and I'm not sure how it got stopped and switched back to Lexapro but I think we need to retry it    Vitals: BP (!) 141/90   Pulse 72   Ht 6' (1.829 m)   Wt 227 lb 9.6 oz (103.2 kg)   BMI 30.87 kg/m   Allergies: Allergies  Allergen Reactions  . Sulfonamide Derivatives Itching, Rash and Other (See Comments)    whelps   Medical History: Past Medical History:  Diagnosis Date  . Anxiety    takes Valium as needed  . Arthritis   . Bladder cancer ([place])    takes Rapaflo daily  .  Blood dyscrasia    07/08/16: pt told he was a "free bleeder" after bleeding a lot when derm cut off mole on forehead. No excessive bleeding with minor wounds at home, no bleeding problems perioperatively with prior surgeries.  . Chronic back pain    spondylolisthesis  . Cluster headaches   . Depression    takes Lexapro daily  . Essential hypertension, benign    takes Diovan-HCT daily  . Joint pain   . Obstructive sleep apnea on CPAP    uses CPAP @ night  . Pneumonia    "several times"--last time about 3-4 yrs ago  . PONV (postoperative nausea and vomiting)    Hx: of "sometimes"  . Prediabetes   Patient has history of back pain, hypertension, tinnitus, Obstructive sleep apnea, knee surgery and back surgery. Patient also has bladder cancer and carpal tunnel release.  His primary care physician is Dr. [name] and he see  Dr. Aline Brochure for his pain management.  Surgical History: Past Surgical History:  Procedure Laterality Date  . ANTERIOR CERVICAL DECOMP/DISCECTOMY FUSION N/A 06/05/2013   Procedure:  C3-4 Anterior Cervical Discectomy and Fusion, Allograft, Plate;  Surgeon: [name], MD;  Location: [facility];  Service: Orthopedics;  Laterality: N/A;  C3-4 Anterior Cervical Discectomy and Fusion, Allograft, Plate  . Arthroscopic knee surgery    . BACK SURGERY      x 2  . BLADDER SURGERY     x 5 to remove tumor  . CARPAL TUNNEL RELEASE    . CATARACT EXTRACTION W/PHACO  12/19/2012   Procedure: CATARACT EXTRACTION PHACO AND INTRAOCULAR LENS PLACEMENT (IOC);  Surgeon: [name], MD;  Location: [facility];  Service: Ophthalmology;  Laterality: Left;  CDE: 26.80  . CATARACT EXTRACTION W/PHACO  01/09/2013   Procedure: CATARACT EXTRACTION PHACO AND INTRAOCULAR LENS PLACEMENT (IOC);  Surgeon: [name], MD;  Location: [facility];  Service: Ophthalmology;  Laterality: Right;  CDE:22.83  . CERVICAL FUSION  06/05/2013   C 3  C4  . CYSTOSCOPY    . REFRACTIVE SURGERY     Hx: of  . RETINAL DETACHMENT SURGERY    . TRANSURETHRAL RESECTION OF BLADDER TUMOR Right 07/27/2015   Procedure: TRANSURETHRAL RESECTION OF BLADDER TUMOR (TURBT);  Surgeon: [name], MD;  Location: [facility];  Service: Urology;  Laterality: Right;   Family History: family history includes Alcohol abuse in his maternal grandfather and mother; Anxiety disorder in his sister; Cancer in his father; Depression in his mother; Hypertension in his father, mother, and sister. Reviewed and nothing is new except the neck surgery that is already on the surgical history.  Mental status examination Patient is casually dressed fairly groomed. He is Irritable today  He maintained fair eye contact. His speech is clear coherent and fluent. His thought process is logical linear and goal-directed. He denies any auditory or visual hallucination. He denies any active or passive  suicidal thinking and homicidal thinking. He described his mood As i depressed and his affect is constricted and he looks on the verge of tears but won't let himself cry His attention and concentration is good. He's alert and oriented x3. His insight judgment and impulse control is okay.  Lab Results:  Results for orders placed or performed during the hospital encounter of 08/05/16 (from the past 8736 hour(s))  CBC   Collection Time: 08/05/16  7:30 AM  Result Value Ref Range   WBC 8.1 4.0 - 10.5 K/uL   RBC 3.47 (L) 4.22 - 5.81 MIL/uL  Hemoglobin 10.6 (L) 13.0 - 17.0 g/dL   HCT 32.1 (L) 39.0 - 52.0 %   MCV 92.5 78.0 - 100.0 fL   MCH 30.5 26.0 - 34.0 pg   MCHC 33.0 30.0 - 36.0 g/dL   RDW 14.2 11.5 - 15.5 %   Platelets 228 150 - 400 K/uL  Basic metabolic panel   Collection Time: 08/05/16  7:30 AM  Result Value Ref Range   Sodium 139 135 - 145 mmol/L   Potassium 4.1 3.5 - 5.1 mmol/L   Chloride 103 101 - 111 mmol/L   CO2 27 22 - 32 mmol/L   Glucose, Bld 96 65 - 99 mg/dL   BUN 14 6 - 20 mg/dL   Creatinine, Ser 0.98 0.61 - 1.24 mg/dL   Calcium 8.5 (L) 8.9 - 10.3 mg/dL   GFR calc non Af Amer >60 >60 mL/min   GFR calc Af Amer >60 >60 mL/min   Anion gap 9 5 - 15  Results for orders placed or performed during the hospital encounter of 07/24/16 (from the past 8736 hour(s))  Basic metabolic panel   Collection Time: 07/24/16  4:11 PM  Result Value Ref Range   Sodium 134 (L) 135 - 145 mmol/L   Potassium 4.2 3.5 - 5.1 mmol/L   Chloride 102 101 - 111 mmol/L   CO2 25 22 - 32 mmol/L   Glucose, Bld 88 65 - 99 mg/dL   BUN 33 (H) 6 - 20 mg/dL   Creatinine, Ser 1.15 0.61 - 1.24 mg/dL   Calcium 8.3 (L) 8.9 - 10.3 mg/dL   GFR calc non Af Amer >60 >60 mL/min   GFR calc Af Amer >60 >60 mL/min   Anion gap 7 5 - 15  CBC with Differential   Collection Time: 07/24/16  4:11 PM  Result Value Ref Range   WBC 16.7 (H) 4.0 - 10.5 K/uL   RBC 3.71 (L) 4.22 - 5.81 MIL/uL   Hemoglobin 11.4 (L) 13.0 - 17.0  g/dL   HCT 34.0 (L) 39.0 - 52.0 %   MCV 91.6 78.0 - 100.0 fL   MCH 30.7 26.0 - 34.0 pg   MCHC 33.5 30.0 - 36.0 g/dL   RDW 14.8 11.5 - 15.5 %   Platelets 229 150 - 400 K/uL   Neutrophils Relative % 78 %   Neutro Abs 13.2 (H) 1.7 - 7.7 K/uL   Lymphocytes Relative 13 %   Lymphs Abs 2.2 0.7 - 4.0 K/uL   Monocytes Relative 6 %   Monocytes Absolute 0.9 0.1 - 1.0 K/uL   Eosinophils Relative 3 %   Eosinophils Absolute 0.5 0.0 - 0.7 K/uL   Basophils Relative 0 %   Basophils Absolute 0.0 0.0 - 0.1 K/uL  Urinalysis, Routine w reflex microscopic (not at [facility])   Collection Time: 07/24/16  4:15 PM  Result Value Ref Range   Color, Urine YELLOW YELLOW   APPearance CLEAR CLEAR   Specific Gravity, Urine 1.020 1.005 - 1.030   pH 5.5 5.0 - 8.0   Glucose, UA NEGATIVE NEGATIVE mg/dL   Hgb urine dipstick NEGATIVE NEGATIVE   Bilirubin Urine NEGATIVE NEGATIVE   Ketones, ur NEGATIVE NEGATIVE mg/dL   Protein, ur NEGATIVE NEGATIVE mg/dL   Nitrite NEGATIVE NEGATIVE   Leukocytes, UA NEGATIVE NEGATIVE  I-Stat CG4 Lactic Acid, ED   Collection Time: 07/24/16  4:41 PM  Result Value Ref Range   Lactic Acid, Venous 1.97 (HH) 0.5 - 1.9 mmol/L  I-Stat CG4 Lactic Acid, ED  Collection Time: 07/24/16  7:10 PM  Result Value Ref Range   Lactic Acid, Venous 1.54 0.5 - 1.9 mmol/L  Results for orders placed or performed during the hospital encounter of 07/22/16 (from the past 8736 hour(s))  CBC   Collection Time: 07/22/16  7:00 AM  Result Value Ref Range   WBC 14.2 (H) 4.0 - 10.5 K/uL   RBC 3.70 (L) 4.22 - 5.81 MIL/uL   Hemoglobin 11.3 (L) 13.0 - 17.0 g/dL   HCT 33.7 (L) 39.0 - 52.0 %   MCV 91.1 78.0 - 100.0 fL   MCH 30.5 26.0 - 34.0 pg   MCHC 33.5 30.0 - 36.0 g/dL   RDW 14.4 11.5 - 15.5 %   Platelets 197 150 - 400 K/uL  Basic metabolic panel   Collection Time: 07/22/16  7:00 AM  Result Value Ref Range   Sodium 136 135 - 145 mmol/L   Potassium 3.6 3.5 - 5.1 mmol/L   Chloride 103 101 - 111 mmol/L   CO2  28 22 - 32 mmol/L   Glucose, Bld 90 65 - 99 mg/dL   BUN 28 (H) 6 - 20 mg/dL   Creatinine, Ser 0.89 0.61 - 1.24 mg/dL   Calcium 7.8 (L) 8.9 - 10.3 mg/dL   GFR calc non Af Amer >60 >60 mL/min   GFR calc Af Amer >60 >60 mL/min   Anion gap 5 5 - 15  Hemoglobin A1c   Collection Time: 07/22/16  7:00 AM  Result Value Ref Range   Hgb A1c MFr Bld 5.8 (H) 4.8 - 5.6 %   Mean Plasma Glucose 120 mg/dL  Results for orders placed or performed during the hospital encounter of 07/15/16 (from the past 8736 hour(s))  I-STAT 7, (LYTES, BLD GAS, ICA, H+H)   Collection Time: 07/15/16 12:39 PM  Result Value Ref Range   pH, Arterial 7.281 (L) 7.350 - 7.450   pCO2 arterial 54.7 (H) 35.0 - 45.0 mmHg   pO2, Arterial 124.0 (H) 80.0 - 100.0 mmHg   Bicarbonate 26.3 (H) 20.0 - 24.0 mEq/L   TCO2 28 0 - 100 mmol/L   O2 Saturation 98.0 %   Acid-base deficit 2.0 0.0 - 2.0 mmol/L   Sodium 141 135 - 145 mmol/L   Potassium 3.4 (L) 3.5 - 5.1 mmol/L   Calcium, Ion 1.21 1.12 - 1.23 mmol/L   HCT 35.0 (L) 39.0 - 52.0 %   Hemoglobin 11.9 (L) 13.0 - 17.0 g/dL   Patient temperature 35.1 C    Sample type ARTERIAL   Troponin I   Collection Time: 07/15/16  3:27 PM  Result Value Ref Range   Troponin I <0.03 <0.03 ng/mL  Basic metabolic panel   Collection Time: 07/15/16  3:27 PM  Result Value Ref Range   Sodium 137 135 - 145 mmol/L   Potassium 3.1 (L) 3.5 - 5.1 mmol/L   Chloride 105 101 - 111 mmol/L   CO2 27 22 - 32 mmol/L   Glucose, Bld 177 (H) 65 - 99 mg/dL   BUN 9 6 - 20 mg/dL   Creatinine, Ser 1.15 0.61 - 1.24 mg/dL   Calcium 7.8 (L) 8.9 - 10.3 mg/dL   GFR calc non Af Amer >60 >60 mL/min   GFR calc Af Amer >60 >60 mL/min   Anion gap 5 5 - 15  Magnesium   Collection Time: 07/15/16  3:27 PM  Result Value Ref Range   Magnesium 1.3 (L) 1.7 - 2.4 mg/dL  CBC with Differential/Platelet  Collection Time: 07/15/16  3:27 PM  Result Value Ref Range   WBC 20.6 (H) 4.0 - 10.5 K/uL   RBC 3.82 (L) 4.22 - 5.81 MIL/uL    Hemoglobin 11.1 (L) 13.0 - 17.0 g/dL   HCT 34.1 (L) 39.0 - 52.0 %   MCV 89.3 78.0 - 100.0 fL   MCH 29.1 26.0 - 34.0 pg   MCHC 32.6 30.0 - 36.0 g/dL   RDW 13.4 11.5 - 15.5 %   Platelets 164 150 - 400 K/uL   Neutrophils Relative % 95 %   Neutro Abs 19.5 (H) 1.7 - 7.7 K/uL   Lymphocytes Relative 2 %   Lymphs Abs 0.4 (L) 0.7 - 4.0 K/uL   Monocytes Relative 3 %   Monocytes Absolute 0.6 0.1 - 1.0 K/uL   Eosinophils Relative 0 %   Eosinophils Absolute 0.0 0.0 - 0.7 K/uL   Basophils Relative 0 %   Basophils Absolute 0.0 0.0 - 0.1 K/uL  Phosphorus   Collection Time: 07/15/16  3:27 PM  Result Value Ref Range   Phosphorus 3.1 2.5 - 4.6 mg/dL  Calcium, ionized   Collection Time: 07/15/16  3:27 PM  Result Value Ref Range   Calcium, Ionized, Serum 4.7 4.5 - 5.6 mg/dL  ECHOCARDIOGRAM COMPLETE   Collection Time: 07/15/16  3:50 PM  Result Value Ref Range   Weight 3,584 oz   BP 163/86 mmHg   LV PW d 14.6 (A) 0.6 - 1.1 mm   FS 39 28 - 44 %   LA ID, A-P, ES 33 mm   IVS/LV PW RATIO, ED 1.12    Stroke v 41 ml   LVOT VTI 19.1 cm   Reg peak vel 189 cm/s   RV sys press 17 mmHg   LV e' LATERAL 5.33 cm/s   LV E/e' medial 12.48    LV E/e'average 12.48    LA diam index 1.47 cm/m2   LA vol A4C 47.5 ml   E decel time 458 msec   LVOT diameter 21 mm   LVOT area 3.46 cm2   LVOT peak vel 87.2 cm/s   LVOT SV 66 mL   E/e' ratio 12.48    MV pk E vel 66.5 m/s   TR max vel 189 cm/s   MV pk A vel 89.7 m/s   LV sys vol 24 21 - 61 mL   LV sys vol index 11 mL/m2   LV dias vol 65 62 - 150 mL   LV dias vol index 29 mL/m2   MV Dec 458    LA diam end sys 33 mm   Simpson's disk 63    TDI e' medial 5    TDI e' lateral 5.33    TAPSE 18.2 mm  Blood gas, arterial   Collection Time: 07/15/16  4:15 PM  Result Value Ref Range   FIO2 0.70    Delivery systems VENTILATOR    Mode PRESSURE REGULATED VOLUME CONTROL    VT 620 mL   LHR 14 resp/min   Peep/cpap 5.0 cm H20   pH, Arterial 7.415 7.350 - 7.450    pCO2 arterial 40.5 35.0 - 45.0 mmHg   pO2, Arterial 105 (H) 80.0 - 100.0 mmHg   Bicarbonate 26.0 (H) 20.0 - 24.0 mEq/L   TCO2 27.4 0 - 100 mmol/L   Acid-Base Excess 1.6 0.0 - 2.0 mmol/L   O2 Saturation 98.1 %   Patient temperature 95.5    Collection site A-LINE    Drawn by  [id number]    Sample type ARTERIAL DRAW    Allens test (pass/fail) PASS PASS  Troponin I   Collection Time: 07/15/16  9:55 PM  Result Value Ref Range   Troponin I <0.03 <0.03 ng/mL  Blood gas, arterial   Collection Time: 07/16/16  3:42 AM  Result Value Ref Range   FIO2 0.50    Delivery systems VENTILATOR    Mode PRESSURE REGULATED VOLUME CONTROL    VT 620 mL   LHR 14 resp/min   Peep/cpap 5.0 cm H20   pH, Arterial 7.420 7.350 - 7.450   pCO2 arterial 41.4 35.0 - 45.0 mmHg   pO2, Arterial 95.8 80.0 - 100.0 mmHg   Bicarbonate 26.3 (H) 20.0 - 24.0 mEq/L   TCO2 27.6 0 - 100 mmol/L   Acid-Base Excess 2.2 (H) 0.0 - 2.0 mmol/L   O2 Saturation 97.6 %   Patient temperature 98.6    Collection site A-LINE    Drawn by [phone]    Sample type ARTERIAL DRAW    Allens test (pass/fail) PASS PASS  Troponin I   Collection Time: 07/16/16  4:00 AM  Result Value Ref Range   Troponin I <0.03 <0.03 ng/mL  CBC with Differential/Platelet   Collection Time: 07/16/16  4:00 AM  Result Value Ref Range   WBC 17.2 (H) 4.0 - 10.5 K/uL   RBC 3.70 (L) 4.22 - 5.81 MIL/uL   Hemoglobin 10.8 (L) 13.0 - 17.0 g/dL   HCT 32.7 (L) 39.0 - 52.0 %   MCV 88.4 78.0 - 100.0 fL   MCH 29.2 26.0 - 34.0 pg   MCHC 33.0 30.0 - 36.0 g/dL   RDW 13.5 11.5 - 15.5 %   Platelets 153 150 - 400 K/uL   Neutrophils Relative % 93 %   Neutro Abs 16.0 (H) 1.7 - 7.7 K/uL   Lymphocytes Relative 5 %   Lymphs Abs 0.8 0.7 - 4.0 K/uL   Monocytes Relative 2 %   Monocytes Absolute 0.3 0.1 - 1.0 K/uL   Eosinophils Relative 0 %   Eosinophils Absolute 0.0 0.0 - 0.7 K/uL   Basophils Relative 0 %   Basophils Absolute 0.0 0.0 - 0.1 K/uL  Magnesium   Collection Time:  07/16/16  4:00 AM  Result Value Ref Range   Magnesium 1.4 (L) 1.7 - 2.4 mg/dL  Renal function panel   Collection Time: 07/16/16  4:00 AM  Result Value Ref Range   Sodium 138 135 - 145 mmol/L   Potassium 4.1 3.5 - 5.1 mmol/L   Chloride 106 101 - 111 mmol/L   CO2 26 22 - 32 mmol/L   Glucose, Bld 138 (H) 65 - 99 mg/dL   BUN 8 6 - 20 mg/dL   Creatinine, Ser 1.03 0.61 - 1.24 mg/dL   Calcium 8.1 (L) 8.9 - 10.3 mg/dL   Phosphorus 2.6 2.5 - 4.6 mg/dL   Albumin 3.0 (L) 3.5 - 5.0 g/dL   GFR calc non Af Amer >60 >60 mL/min   GFR calc Af Amer >60 >60 mL/min   Anion gap 6 5 - 15  Basic metabolic panel   Collection Time: 07/17/16  6:05 AM  Result Value Ref Range   Sodium 141 135 - 145 mmol/L   Potassium 4.2 3.5 - 5.1 mmol/L   Chloride 107 101 - 111 mmol/L   CO2 28 22 - 32 mmol/L   Glucose, Bld 150 (H) 65 - 99 mg/dL   BUN 15 6 - 20 mg/dL   Creatinine, Ser  1.08 0.61 - 1.24 mg/dL   Calcium 8.1 (L) 8.9 - 10.3 mg/dL   GFR calc non Af Amer >60 >60 mL/min   GFR calc Af Amer >60 >60 mL/min   Anion gap 6 5 - 15  CBC   Collection Time: 07/17/16  6:05 AM  Result Value Ref Range   WBC 18.2 (H) 4.0 - 10.5 K/uL   RBC 3.46 (L) 4.22 - 5.81 MIL/uL   Hemoglobin 10.1 (L) 13.0 - 17.0 g/dL   HCT 30.6 (L) 39.0 - 52.0 %   MCV 88.4 78.0 - 100.0 fL   MCH 29.2 26.0 - 34.0 pg   MCHC 33.0 30.0 - 36.0 g/dL   RDW 13.9 11.5 - 15.5 %   Platelets 179 150 - 400 K/uL  I-STAT 4, (NA,K, GLUC, HGB,HCT)   Collection Time: 07/17/16  6:02 PM  Result Value Ref Range   Sodium 144 135 - 145 mmol/L   Potassium 4.1 3.5 - 5.1 mmol/L   Glucose, Bld 138 (H) 65 - 99 mg/dL   HCT 27.0 (L) 39.0 - 52.0 %   Hemoglobin 9.2 (L) 13.0 - 17.0 g/dL  Results for orders placed or performed during the hospital encounter of 07/07/16 (from the past 8736 hour(s))  Surgical pcr screen   Collection Time: 07/07/16 11:30 AM  Result Value Ref Range   MRSA, PCR NEGATIVE NEGATIVE   Staphylococcus aureus NEGATIVE NEGATIVE  Basic metabolic panel    Collection Time: 07/07/16 11:30 AM  Result Value Ref Range   Sodium 141 135 - 145 mmol/L   Potassium 3.6 3.5 - 5.1 mmol/L   Chloride 107 101 - 111 mmol/L   CO2 26 22 - 32 mmol/L   Glucose, Bld 108 (H) 65 - 99 mg/dL   BUN 17 6 - 20 mg/dL   Creatinine, Ser 1.13 0.61 - 1.24 mg/dL   Calcium 8.9 8.9 - 10.3 mg/dL   GFR calc non Af Amer >60 >60 mL/min   GFR calc Af Amer >60 >60 mL/min   Anion gap 8 5 - 15  CBC   Collection Time: 07/07/16 11:30 AM  Result Value Ref Range   WBC 8.6 4.0 - 10.5 K/uL   RBC 4.91 4.22 - 5.81 MIL/uL   Hemoglobin 14.9 13.0 - 17.0 g/dL   HCT 45.0 39.0 - 52.0 %   MCV 91.6 78.0 - 100.0 fL   MCH 30.3 26.0 - 34.0 pg   MCHC 33.1 30.0 - 36.0 g/dL   RDW 14.3 11.5 - 15.5 %   Platelets 202 150 - 400 K/uL  Type and screen [place]   Collection Time: 07/07/16 11:40 AM  Result Value Ref Range   ABO/RH(D) O POS    Antibody Screen NEG    Sample Expiration 07/21/2016    Extend sample reason NO TRANSFUSIONS OR PREGNANCY IN THE PAST 3 MONTHS   ABO/Rh   Collection Time: 07/07/16 11:40 AM  Result Value Ref Range   ABO/RH(D) O POS   Results for orders placed or performed during the hospital encounter of 05/17/16 (from the past 8736 hour(s))  Basic metabolic panel   Collection Time: 05/17/16 10:50 AM  Result Value Ref Range   Sodium 138 135 - 145 mmol/L   Potassium 3.9 3.5 - 5.1 mmol/L   Chloride 106 101 - 111 mmol/L   CO2 26 22 - 32 mmol/L   Glucose, Bld 100 (H) 65 - 99 mg/dL   BUN 18 6 - 20 mg/dL   Creatinine, Ser 1.16  0.61 - 1.24 mg/dL   Calcium 9.1 8.9 - 10.3 mg/dL   GFR calc non Af Amer >60 >60 mL/min   GFR calc Af Amer >60 >60 mL/min   Anion gap 6 5 - 15  CBC   Collection Time: 05/17/16 10:50 AM  Result Value Ref Range   WBC 9.8 4.0 - 10.5 K/uL   RBC 5.24 4.22 - 5.81 MIL/uL   Hemoglobin 15.4 13.0 - 17.0 g/dL   HCT 47.0 39.0 - 52.0 %   MCV 89.7 78.0 - 100.0 fL   MCH 29.4 26.0 - 34.0 pg   MCHC 32.8 30.0 - 36.0 g/dL   RDW 13.9 11.5 -  15.5 %   Platelets 206 150 - 400 K/uL  I-stat troponin, ED   Collection Time: 05/17/16 10:54 AM  Result Value Ref Range   Troponin i, poc 0.00 0.00 - 0.08 ng/mL   Comment 3          Urinalysis, Routine w reflex microscopic   Collection Time: 05/17/16 12:07 PM  Result Value Ref Range   Color, Urine YELLOW YELLOW   APPearance CLEAR CLEAR   Specific Gravity, Urine 1.020 1.005 - 1.030   pH 5.0 5.0 - 8.0   Glucose, UA NEGATIVE NEGATIVE mg/dL   Hgb urine dipstick MODERATE (A) NEGATIVE   Bilirubin Urine NEGATIVE NEGATIVE   Ketones, ur NEGATIVE NEGATIVE mg/dL   Protein, ur 30 (A) NEGATIVE mg/dL   Nitrite NEGATIVE NEGATIVE   Leukocytes, UA TRACE (A) NEGATIVE  Urine microscopic-add on   Collection Time: 05/17/16 12:07 PM  Result Value Ref Range   Squamous Epithelial / LPF 0-5 (A) NONE SEEN   WBC, UA 6-30 0 - 5 WBC/hpf   RBC / HPF 6-30 0 - 5 RBC/hpf   Bacteria, UA RARE (A) NONE SEEN   Casts HYALINE CASTS (A) NEGATIVE   Urine-Other MUCOUS PRESENT    Assessment Axis I Major depressive disorder, 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  I reviewed CC, tobacco/med/surg Hx, meds effects/ side effects, problem list, therapies and responses as well as current situation/symptoms discussed options. Patient will Continue Valium 5 mg a take it only at night. He will discontinue Prozac and restart Cymbalta 60 mg daily He'll return to see me in 4 weeks See orders and pt instructions for more details.  MEDICATIONS this encounter: Meds ordered this encounter  Medications  . DULoxetine (CYMBALTA) 60 MG capsule    Sig: Take 1 capsule (60 mg total) by mouth daily.    Dispense:  30 capsule    Refill:  2    Medical Decision Making Problem Points:  Established problem, stable/improving (1), New problem, with no additional work-up planned (3), Review of last therapy  session (1) and Review of psycho-social stressors (1) Data Points:  Review or order clinical lab tests (1) Review of medication regiment & side effects (2) Review of new medications or change in dosage (2)  I certify that outpatient services furnished can reasonably be expected to improve the patient's condition.   [name], MD

## 2017-03-15 ENCOUNTER — Ambulatory Visit (HOSPITAL_COMMUNITY): Payer: Medicare HMO | Admitting: Physical Therapy

## 2017-03-15 ENCOUNTER — Telehealth (HOSPITAL_COMMUNITY): Payer: Self-pay | Admitting: Internal Medicine

## 2017-03-15 NOTE — Telephone Encounter (Signed)
03/15/17  He called to cx and said that he did something to his back over the weekend and he had already called his dr.

## 2017-03-25 ENCOUNTER — Encounter (HOSPITAL_COMMUNITY): Payer: Self-pay | Admitting: Emergency Medicine

## 2017-03-25 ENCOUNTER — Ambulatory Visit (HOSPITAL_COMMUNITY)
Admission: EM | Admit: 2017-03-25 | Discharge: 2017-03-25 | Disposition: A | Discharge status: Home / Self Care | Payer: Medicare HMO | Attending: Internal Medicine | Admitting: Internal Medicine

## 2017-03-25 DIAGNOSIS — J069 Acute upper respiratory infection, unspecified: Secondary | ICD-10-CM | POA: Diagnosis not present

## 2017-03-25 DIAGNOSIS — B9789 Other viral agents as the cause of diseases classified elsewhere: Secondary | ICD-10-CM | POA: Diagnosis not present

## 2017-03-25 MED ORDER — PREDNISONE 50 MG PO TABS: ORAL_TABLET | ORAL | 0 refills | Status: DC

## 2017-03-25 MED ORDER — BENZONATATE 100 MG PO CAPS: 100.0000 mg | ORAL_CAPSULE | Freq: Three times a day (TID) | ORAL | 0 refills | Status: DC

## 2017-03-25 NOTE — ED Triage Notes (Signed)
Pt c/o cold sx onset: 6 days  Sx include: facial pressure, nasal drainage/congestion, ST, HA, dry cough, wheezing, SOB  Denies: fevers  Taking: OTC cold meds w/temp relief.   A&O x4... NAD

## 2017-03-25 NOTE — ED Provider Notes (Signed)
CSN: [id number]     Arrival date & time 03/25/17  1608 History   First MD Initiated Contact with Patient 03/25/17 1704     Chief Complaint  Patient presents with  . URI   (Consider location/radiation/quality/duration/timing/severity/associated sxs/prior Treatment) 71 year old male presents to clinic for evaluation of URI type symptoms ongoing for 6 days. He further states he will need a note recorded in his chart that he came to urgent care today because his primary care doctor's office was closed.   The history is provided by the patient.  URI  Presenting symptoms: congestion, cough, facial pain, fatigue and rhinorrhea   Presenting symptoms: no fever and no sore throat   Severity:  Moderate Onset quality:  Gradual Duration:  6 days Timing:  Constant Progression:  Worsening Chronicity:  New Relieved by:  Decongestant Worsened by:  Nothing Associated symptoms: headaches, sinus pain and sneezing   Associated symptoms: no arthralgias, no myalgias, no neck pain, no swollen glands and no wheezing     Past Medical History:  Diagnosis Date  . Anxiety    takes Valium as needed  . Arthritis   . Bladder cancer ([place])    takes Rapaflo daily  . Blood dyscrasia    07/08/16: pt told he was a "free bleeder" after bleeding a lot when derm cut off mole on forehead. No excessive bleeding with minor wounds at home, no bleeding problems perioperatively with prior surgeries.  . Chronic back pain    spondylolisthesis  . Cluster headaches   . Depression    takes Lexapro daily  . Essential hypertension, benign    takes Diovan-HCT daily  . Joint pain   . Obstructive sleep apnea on CPAP    uses CPAP @ night  . Pneumonia    "several times"--last time about 3-4 yrs ago  . PONV (postoperative nausea and vomiting)    Hx: of "sometimes"  . Prediabetes    Past Surgical History:  Procedure Laterality Date  . ANTERIOR CERVICAL DECOMP/DISCECTOMY FUSION N/A 06/05/2013   Procedure:  C3-4 Anterior  Cervical Discectomy and Fusion, Allograft, Plate;  Surgeon: [name], MD;  Location: [facility];  Service: Orthopedics;  Laterality: N/A;  C3-4 Anterior Cervical Discectomy and Fusion, Allograft, Plate  . Arthroscopic knee surgery    . BACK SURGERY      x 2  . BLADDER SURGERY     x 5 to remove tumor  . CARPAL TUNNEL RELEASE    . CATARACT EXTRACTION W/PHACO  12/19/2012   Procedure: CATARACT EXTRACTION PHACO AND INTRAOCULAR LENS PLACEMENT (IOC);  Surgeon: [name], MD;  Location: [facility];  Service: Ophthalmology;  Laterality: Left;  CDE: 26.80  . CATARACT EXTRACTION W/PHACO  01/09/2013   Procedure: CATARACT EXTRACTION PHACO AND INTRAOCULAR LENS PLACEMENT (IOC);  Surgeon: [name], MD;  Location: [facility];  Service: Ophthalmology;  Laterality: Right;  CDE:22.83  . CERVICAL FUSION  06/05/2013   C 3  C4  . CYSTOSCOPY    . REFRACTIVE SURGERY     Hx: of  . RETINAL DETACHMENT SURGERY    . TRANSURETHRAL RESECTION OF BLADDER TUMOR Right 07/27/2015   Procedure: TRANSURETHRAL RESECTION OF BLADDER TUMOR (TURBT);  Surgeon: [name], MD;  Location: [facility];  Service: Urology;  Laterality: Right;   Family History  Problem Relation Age of Onset  . Hypertension Father   . Cancer Father     Prostate  . Depression Mother   . Hypertension Mother   . Alcohol abuse Mother   .  Hypertension Sister   . Anxiety disorder Sister   . Alcohol abuse Maternal Grandfather   . ADD / ADHD Neg Hx   . Bipolar disorder Neg Hx   . Dementia Neg Hx   . Drug abuse Neg Hx   . OCD Neg Hx   . Paranoid behavior Neg Hx   . Schizophrenia Neg Hx   . Seizures Neg Hx   . Sexual abuse Neg Hx   . Physical abuse Neg Hx    Social History  Substance Use Topics  . Smoking status: Former Smoker    Quit date: 12/30/1976  . Smokeless tobacco: Never Used     Comment: 1985  . Alcohol use No     Comment: quit 1990    Review of Systems  Constitutional: Positive for fatigue. Negative for fever.  HENT: Positive for  congestion, rhinorrhea, sinus pain and sneezing. Negative for sore throat.   Respiratory: Positive for cough. Negative for chest tightness, shortness of breath and wheezing.   Cardiovascular: Negative for chest pain and leg swelling.  Gastrointestinal: Negative for abdominal pain, constipation, diarrhea, nausea and vomiting.  Genitourinary: Negative.   Musculoskeletal: Negative for arthralgias, myalgias and neck pain.  Neurological: Positive for headaches.  All other systems reviewed and are negative.   Allergies  Sulfonamide derivatives  Home Medications   Prior to Admission medications   Medication Sig Start Date End Date Taking? Authorizing Provider  diazepam (VALIUM) 5 MG tablet Take 1 tablet (5 mg total) by mouth 2 (two) times daily as needed for anxiety. 02/01/17  Yes Cloria Spring, MD  DULoxetine (CYMBALTA) 60 MG capsule Take 1 capsule (60 mg total) by mouth daily. 03/10/17 03/10/18 Yes Cloria Spring, MD  fluticasone (FLONASE) 50 MCG/ACT nasal spray Place 1 spray into both nostrils daily.   Yes Historical Provider, MD  valsartan-hydrochlorothiazide (DIOVAN-HCT) 320-12.5 MG tablet TK 1 T PO  QD 12/07/16  Yes Historical Provider, MD  acetaminophen (TYLENOL) 500 MG tablet Take 500 mg by mouth every 6 (six) hours as needed for mild pain or headache. For pain     Historical Provider, MD  benzonatate (TESSALON) 100 MG capsule Take 1 capsule (100 mg total) by mouth every 8 (eight) hours. 03/25/17   Barnet Glasgow, NP  Calcium Carbonate-Vitamin D (TGT CALCIUM DIETARY SUPPLEMENT PO) Take 1 tablet by mouth once a day  every other day    Historical Provider, MD  DUREZOL 0.05 % EMUL  01/01/17   Historical Provider, MD  Magnesium Hydroxide (MILK OF MAGNESIA PO) Milk of magnesia 30 ml by mouth daily as needed for constipation may try fleets enema.    Historical Provider, MD  Multiple Vitamin (MULTIVITAMIN WITH MINERALS) TABS tablet Take 1 tablet by mouth daily.    Historical Provider, MD  predniSONE  (DELTASONE) 50 MG tablet Take 1 tablet daily with food 03/25/17   Barnet Glasgow, NP  sennosides-docusate sodium (SENOKOT-S) 8.6-50 MG tablet Take 1 tablet by mouth at bedtime.    Historical Provider, MD   Meds Ordered and Administered this Visit  Medications - No data to display  BP (!) 161/92 (BP Location: Right Arm) Comment: notified rn  Pulse 78   Temp 98.1 F (36.7 C) (Oral)   Resp 16   SpO2 98%  No data found.   Physical Exam  Constitutional: He is oriented to person, place, and time. He appears well-developed and well-nourished. No distress.  HENT:  Head: Normocephalic and atraumatic.  Right Ear: Tympanic membrane and external ear  normal.  Left Ear: Tympanic membrane and external ear normal.  Nose: Nose normal. Right sinus exhibits no maxillary sinus tenderness and no frontal sinus tenderness. Left sinus exhibits no maxillary sinus tenderness and no frontal sinus tenderness.  Mouth/Throat: Uvula is midline and oropharynx is clear and moist. No oropharyngeal exudate.  Eyes: Pupils are equal, round, and reactive to light.  Neck: Normal range of motion. Neck supple. No JVD present.  Cardiovascular: Normal rate and regular rhythm.   Pulmonary/Chest: Effort normal and breath sounds normal. No respiratory distress. He has no wheezes.  Abdominal: Soft. Bowel sounds are normal.  Lymphadenopathy:       Head (right side): No submental, no submandibular, no tonsillar and no preauricular adenopathy present.       Head (left side): No submental, no submandibular, no tonsillar and no preauricular adenopathy present.    He has no cervical adenopathy.  Neurological: He is alert and oriented to person, place, and time.  Skin: Skin is warm and dry. Capillary refill takes less than 2 seconds. No rash noted. He is not diaphoretic. No erythema.  Psychiatric: He has a normal mood and affect. His behavior is normal.  Nursing note and vitals reviewed.   Urgent Care Course     Procedures  (including critical care time)  Labs Review Labs Reviewed - No data to display  Imaging Review No results found.       MDM   1. Viral URI with cough     Symptoms consistent with viral URI. Provided counseling on over-the-counter medicines for symptom management. Given Tessalon and prednisone for respiratory symptoms. Provided follow-up guidelines of when to consider antibiotics such as symptoms longer than 10 days, unilateral sinus pain, dental pain, bloody sputum.   Barnet Glasgow, NP 03/25/17 1725

## 2017-03-25 NOTE — Discharge Instructions (Signed)
You most likely have a viral URI, I advise rest, plenty of fluids and management of symptoms with over the counter medicines. For symptoms you may take Tylenol as needed every 4-6 hours for body aches or fever, not to exceed 4,000 mg a day, Take mucinex or mucinex DM ever 12 hours with a full glass of water, you may use an inhaled steroid such as Flonase, 2 sprays each nostril once a day for congestion, or an antihistamine such as Claritin or Zyrtec once a day. For cough, I have prescribed a medication called Tessalon. Take 1 tablet every 8 hours as needed for your cough. I also prescribed prednisone take one tablet daily with food. Should your symptoms worsen or fail to resolve, follow up with your primary care provider or return to clinic.

## 2017-03-29 ENCOUNTER — Ambulatory Visit: Payer: Medicare Other | Admitting: Diagnostic Neuroimaging

## 2017-03-31 DIAGNOSIS — J060 Acute laryngopharyngitis: Secondary | ICD-10-CM | POA: Diagnosis not present

## 2017-03-31 DIAGNOSIS — R05 Cough: Secondary | ICD-10-CM | POA: Diagnosis not present

## 2017-04-01 ENCOUNTER — Ambulatory Visit (INDEPENDENT_AMBULATORY_CARE_PROVIDER_SITE_OTHER): Payer: Medicare HMO | Admitting: Diagnostic Neuroimaging

## 2017-04-01 ENCOUNTER — Encounter: Payer: Self-pay | Admitting: Diagnostic Neuroimaging

## 2017-04-01 VITALS — BP 161/97 | HR 102 | Ht 72.0 in | Wt 225.8 lb

## 2017-04-01 DIAGNOSIS — R269 Unspecified abnormalities of gait and mobility: Secondary | ICD-10-CM | POA: Diagnosis not present

## 2017-04-01 DIAGNOSIS — R413 Other amnesia: Secondary | ICD-10-CM | POA: Diagnosis not present

## 2017-04-01 DIAGNOSIS — G20 Parkinson's disease: Secondary | ICD-10-CM | POA: Diagnosis not present

## 2017-04-01 MED ORDER — CARBIDOPA-LEVODOPA 25-100 MG PO TABS: 1.0000 | ORAL_TABLET | Freq: Three times a day (TID) | ORAL | 6 refills | Status: DC

## 2017-04-01 NOTE — Progress Notes (Signed)
[facility]  PATIENT: Zachary [name] DOB: 08-28-1946  REFERRING CLINICIAN: Merlyn Albert, MD HISTORY FROM: patient and wife REASON FOR VISIT: follow up   HISTORICAL  CHIEF COMPLAINT:  Chief Complaint  Patient presents with  . Follow-up  . Parkisonism  . Memory Loss    HISTORY OF PRESENT ILLNESS:   UPDATE 04/01/17: Since last visit has had MRI brain and c-spine, and had PT evaluation. Memory loss and gait issues are stable. Has life stress with concern about his mother's health. He is looking forward to football pre-season starting.   PRIOR HPI (01/15/17): 72 year old male here for evaluation of memory problems and gait difficulty. Patient reports memory problems for past 1 year. Wife thinks he has been having memory problem for at least 3 years. Sheis forgetting dates, fax, having trouble with comprehension, sleeping more. Also patient having more balance and walking problems. She has noticed that patient is taking small shuffling steps. 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 Following these 2 surgeries he had extensive physical therapy and rehabilitation. Patient attributes his balance problems to his low back surgery problem. Patient also has history of bladder cancer, hypertension, ringing in ears, depression, anxiety. Patient has had deterioration handwriting, irregular sleep, soft and hoarse voice. Patient having some intermittent tremor in his hands.   REVIEW OF SYSTEMS: Full 14 system review of systems performed and negative with exception of: depression anxiety ringing in ears runny nose apnea.   ALLERGIES: Allergies  Allergen Reactions  . Sulfonamide Derivatives Itching, Rash and Other (See Comments)    whelps    HOME MEDICATIONS: Outpatient Medications Prior to Visit  Medication Sig Dispense Refill  . acetaminophen (TYLENOL) 500 MG tablet Take 500  mg by mouth every 6 (six) hours as needed for mild pain or headache. For pain     . diazepam (VALIUM) 5 MG tablet Take 1 tablet (5 mg total) by mouth 2 (two) times daily as needed for anxiety. 60 tablet 5  . DULoxetine (CYMBALTA) 60 MG capsule Take 1 capsule (60 mg total) by mouth daily. 30 capsule 2  . fluticasone (FLONASE) 50 MCG/ACT nasal spray Place 1 spray into both nostrils daily.    . Multiple Vitamin (MULTIVITAMIN WITH MINERALS) TABS tablet Take 1 tablet by mouth daily.    . valsartan-hydrochlorothiazide (DIOVAN-HCT) 320-12.5 MG tablet TK 1 T PO  QD  2  . benzonatate (TESSALON) 100 MG capsule Take 1 capsule (100 mg total) by mouth every 8 (eight) hours. (Patient not taking: Reported on 04/01/2017) 21 capsule 0  . Calcium Carbonate-Vitamin D (TGT CALCIUM DIETARY SUPPLEMENT PO) Take 1 tablet by mouth once a day  every other day    . DUREZOL 0.05 % EMUL     . Magnesium Hydroxide (MILK OF MAGNESIA PO) Milk of magnesia 30 ml by mouth daily as needed for constipation may try fleets enema.    . predniSONE (DELTASONE) 50 MG tablet Take 1 tablet daily with food (Patient not taking: Reported on 04/01/2017) 5 tablet 0  . sennosides-docusate sodium (SENOKOT-S) 8.6-50 MG tablet Take 1 tablet by mouth at bedtime.     No facility-administered medications prior to visit.     PAST MEDICAL HISTORY: Past Medical History:  Diagnosis Date  . Anxiety    takes Valium as needed  . Arthritis   . Bladder cancer ([place])    takes Rapaflo daily  . Blood dyscrasia  07/08/16: pt told he was a "free bleeder" after bleeding a lot when derm cut off mole on forehead. No excessive bleeding with minor wounds at home, no bleeding problems perioperatively with prior surgeries.  . Chronic back pain    spondylolisthesis  . Cluster headaches   . Depression    takes Lexapro daily  . Essential hypertension, benign    takes Diovan-HCT daily  . Joint pain   . Obstructive sleep apnea on CPAP    uses CPAP @ night  .  Pneumonia    "several times"--last time about 3-4 yrs ago  . PONV (postoperative nausea and vomiting)    Hx: of "sometimes"  . Prediabetes     PAST SURGICAL HISTORY: Past Surgical History:  Procedure Laterality Date  . ANTERIOR CERVICAL DECOMP/DISCECTOMY FUSION N/A 06/05/2013   Procedure:  C3-4 Anterior Cervical Discectomy and Fusion, Allograft, Plate;  Surgeon: [name], MD;  Location: [place];  Service: Orthopedics;  Laterality: N/A;  C3-4 Anterior Cervical Discectomy and Fusion, Allograft, Plate  . Arthroscopic knee surgery    . BACK SURGERY      x 2  . BLADDER SURGERY     x 5 to remove tumor  . CARPAL TUNNEL RELEASE    . CATARACT EXTRACTION W/PHACO  12/19/2012   Procedure: CATARACT EXTRACTION PHACO AND INTRAOCULAR LENS PLACEMENT (IOC);  Surgeon: [name], MD;  Location: [facility];  Service: Ophthalmology;  Laterality: Left;  CDE: 26.80  . CATARACT EXTRACTION W/PHACO  01/09/2013   Procedure: CATARACT EXTRACTION PHACO AND INTRAOCULAR LENS PLACEMENT (IOC);  Surgeon: [name], MD;  Location: [facility];  Service: Ophthalmology;  Laterality: Right;  CDE:22.83  . CERVICAL FUSION  06/05/2013   C 3  C4  . CYSTOSCOPY    . REFRACTIVE SURGERY     Hx: of  . RETINAL DETACHMENT SURGERY    . TRANSURETHRAL RESECTION OF BLADDER TUMOR Right 07/27/2015   Procedure: TRANSURETHRAL RESECTION OF BLADDER TUMOR (TURBT);  Surgeon: [name], MD;  Location: [facility];  Service: Urology;  Laterality: Right;    FAMILY HISTORY: Family History  Problem Relation Age of Onset  . Hypertension Father   . Cancer Father     Prostate  . Depression Mother   . Hypertension Mother   . Alcohol abuse Mother   . Hypertension Sister   . Anxiety disorder Sister   . Alcohol abuse Maternal Grandfather   . ADD / ADHD Neg Hx   . Bipolar disorder Neg Hx   . Dementia Neg Hx   . Drug abuse Neg Hx   . OCD Neg Hx   . Paranoid behavior Neg Hx   . Schizophrenia Neg Hx   . Seizures Neg Hx   . Sexual abuse Neg Hx   .  Physical abuse Neg Hx     SOCIAL HISTORY:  Social History   Social History  . Marital status: Married    Spouse name: [name]   . Number of children: 0  . Years of education: 90   Occupational History  . Retired   .  Unemployed   Social History Main Topics  . Smoking status: Former Smoker    Quit date: 12/30/1976  . Smokeless tobacco: Never Used     Comment: 1985  . Alcohol use No     Comment: quit 1990  . Drug use: No  . Sexual activity: Yes   Other Topics Concern  . Not on file   Social History Narrative   Lives with  wife    caffeine- sodas     PHYSICAL EXAM  GENERAL EXAM/CONSTITUTIONAL: Vitals:  Vitals:   04/01/17 1342  BP: (!) 161/97  Pulse: (!) 102  Weight: 225 lb 12.8 oz (102.4 kg)  Height: 6' (1.829 m)   Body mass index is 30.62 kg/m. No exam data present  Patient is in no distress; well developed, nourished and groomed; neck is supple  FLAT AFFECT, MASKED FACIES  CARDIOVASCULAR:  Examination of carotid arteries is normal; no carotid bruits  Regular rate and rhythm, no murmurs  Examination of peripheral vascular system by observation and palpation is normal  EYES:  Ophthalmoscopic exam of optic discs and posterior segments is normal; no papilledema or hemorrhages  MUSCULOSKELETAL:  Gait, strength, tone, movements noted in Neurologic exam below  NEUROLOGIC: MENTAL STATUS:  MMSE - [place] Exam 04/01/2017 01/15/2017  Orientation to time 4 5  Orientation to Place 5 5  Registration 3 3  Attention/ Calculation 4 4  Recall 2 3  Language- name 2 objects 2 2  Language- repeat 1 0  Language- follow 3 step command 3 3  Language- read & follow direction 1 1  Write a sentence 1 1  Copy design 1 0  Total score 27 27    awake, alert, oriented to person, place and time  recent and remote memory intact  normal attention and concentration  language fluent, comprehension intact, naming intact,   fund of knowledge appropriate  AFT  10 in 30 seconds  CRANIAL NERVE:   2nd - no papilledema on fundoscopic exam  2nd, 3rd, 4th, 6th - pupils equal and reactive to light, visual fields full to confrontation, extraocular muscles intact, no nystagmus  5th - facial sensation symmetric  7th - facial strength symmetric  8th - hearing intact  9th - palate elevates symmetrically, uvula midline  11th - shoulder shrug symmetric  12th - tongue protrusion midline  SOFT HOARSE VOICE  MOTOR:   normal bulk; full strength in the BUE, BLE  BRADYKINESIA IN LUE > RUE  BRADYKINESIA IN BLE  MILD TREMOR IN BUE WITH CONTRALATERAL RAM  SENSORY:   normal and symmetric to light touch, temperature, vibration  DECR TEMP IN LEFT HAND  DECR VIB IN LEFT FOOT  BILATERAL FOOT VIB < 5 SEC  COORDINATION:   finger-nose-finger, fine finger movements SLOW  REFLEXES:   deep tendon reflexes TRACE and symmetric  GAIT/STATION:   narrow based gait; STOOPED POSTURE; SHORT STEPS; SHUFFLING GAIT; DECR ARM SWING     DIAGNOSTIC DATA (LABS, IMAGING, TESTING) - I reviewed patient records, labs, notes, testing and imaging myself where available.  Lab Results  Component Value Date   WBC 8.1 08/05/2016   HGB 10.6 (L) 08/05/2016   HCT 32.1 (L) 08/05/2016   MCV 92.5 08/05/2016   PLT 228 08/05/2016      Component Value Date/Time   NA 139 08/05/2016 0730   K 4.1 08/05/2016 0730   CL 103 08/05/2016 0730   CO2 27 08/05/2016 0730   GLUCOSE 96 08/05/2016 0730   BUN 14 08/05/2016 0730   CREATININE 0.98 08/05/2016 0730   CALCIUM 8.5 (L) 08/05/2016 0730   PROT 7.8 06/01/2013 1521   ALBUMIN 3.0 (L) 07/16/2016 0400   AST 16 06/01/2013 1521   ALT 21 06/01/2013 1521   ALKPHOS 110 06/01/2013 1521   BILITOT 0.2 (L) 06/01/2013 1521   GFRNONAA >60 08/05/2016 0730   GFRAA >60 08/05/2016 0730   No results found for: CHOL, HDL,  LDLCALC, LDLDIRECT, TRIG, CHOLHDL Lab Results  Component Value Date   HGBA1C 5.8 (H) 07/22/2016   No results  found for: VITAMINB12 No results found for: TSH  01/28/12 MRI brain  - Nonspecific slightly atypical white matter type changes as noted above. - No internal auditory canal or skull base abnormality is noted.  01/28/12 MRA head - Intracranial atherosclerotic type changes predominant involving branch vessels as detailed above.  01/28/12 MRV head - Loss of signal of the left transverse sinus probably is related to artifact rather than true stenosis. Overall, the major dural sinuses appear patent.   05/09/13 MRI cervical [I reviewed images myself and agree with interpretation. -VRP]  - Multilevel cervical spondylosis detailed above.  Progressive C3-C4 degenerative disc disease with flattening of the cervical cord. Moderate to severe central stenosis with 7 mm AP diameter of the thecal sac.  No cord edema.  C3-C4 further collapse of the disc space and the extrusion appears slightly larger than on the prior exam.  Other levels appear similar with multilevel mild central stenosis and bilateral foraminal stenosis.  In this patient with left upper extremity radiculopathy, the left-sided foraminal stenosis is most pronounced at C6-C7, potentially affecting the left C7 nerve.  06/12/16 MRI lumbar spine [I reviewed images myself and agree with interpretation. -VRP]  - Severe stenosis at L3-4 is multifactorial, related to central protrusion, posterior element hypertrophy and short pedicles affecting both L4 nerve roots. - 2 mm slip L4-5 associated with a central and leftward protrusion also with posterior element hypertrophy and short pedicles. Moderate to severe stenosis along with LEFT greater than RIGHT L5 nerve root impingement.  - No recurrent leftward protrusion at the L5-S1 level, however a central and rightward protrusion at L5-S1 could affect the RIGHT S1 nerve root.  12/23/16 CT head [I reviewed images myself and agree with interpretation. -VRP]  - Mild chronic ischemic white matter disease. No acute  intracranial abnormality seen.  12/23/16 CT cervical [I reviewed images myself and agree with interpretation. -VRP]  - Postsurgical and degenerative changes as described above. No acute abnormality seen in the cervical spine.  01/23/17 MRI of the brain without contrast shows the following: 1.     Moderate cortical atrophy, more pronounced in the mesial temporal lobes, which has significantly progressed when compared to the 01/28/2012 MRI. 2.    Mild to moderate chronic microvascular ischemic changes, mildly progressed when compared to the previous MRI. 3.    There are no acute findings.  01/23/17 MRI of the cervical spine without contrast shows the following: 1.    Since the MRI of the cervical spine dated 05/09/2013, the patient has undergone C3-C4 ACDF with resolution of the spinal stenosis noted at that time. 2.    There are multilevel degenerative changes resulting in mild spinal stenosis at C4-C5. Our various degrees of mild-to-moderate foraminal narrowing as detailed above that did not lead to nerve root compression. 3.    The spinal cord appears normal. 4.    There are no acute findings.     ASSESSMENT AND PLAN  71 y.o. year old male here with progressive memory problems, short-term recall issues, cognitive difficulty, balance and walking problems, tremor. Also with signs and symptoms of parkinsonism. Other contributing factors could include depression, chronic pain, degenerative spine disease, aging and deconditioning.   Ddx memory loss: depression, chronic pain, life stress, neurodegenerative  Ddx balance difficulty: parkinsonism, cervical myelopathy, lumbar radiculopathy  1. Parkinsonism, unspecified Parkinsonism type ([place])   2. Gait difficulty  3. Memory loss      PLAN:  I spent 40 minutes of face to face time with patient. Greater than 50% of time was spent in counseling and coordination of care with patient. In summary we discussed:     - start carbidopa/levodopa  (half tab three times per day with meals); after 1-2 weeks, increase to full tab three times per day with meals - continue PT exercises - consider rollator walker or cane - continue depression treatments - may consider dementia medications (donepezil or memantine) at next visit  Meds ordered this encounter  Medications  . carbidopa-levodopa (SINEMET IR) 25-100 MG tablet    Sig: Take 1 tablet by mouth 3 (three) times daily before meals.    Dispense:  90 tablet    Refill:  6   Return in about 4 months (around 08/01/2017).    Penni Bombard, MD 06/29/5328, 9:24 PM Certified in Neurology, Neurophysiology and Neuroimaging  [facility] [address] [place], [place] [address] [phone]

## 2017-04-01 NOTE — Patient Instructions (Signed)
Thank you for coming to see [place] at [facility]. I hope we have been able to provide you high quality care today.  You may receive a patient satisfaction survey over the next few weeks. We would appreciate your feedback and comments so that we may continue to improve ourselves and the health of our patients.   - start carbidopa/levodopa (half tab three times per day with meals); after 1-2 weeks, increase to full tab three times per day with meals  - continue physical therapy exercises  - consider walker or cane to help with balance and avoid falling down   ~~~~~~~~~~~~~~~~~~~~~~~~~~~~~~~~~~~~~~~~~~~~~~~~~~~~~~~~~~~~~~~~~  DR. Sota Hetz'S GUIDE TO HAPPY AND HEALTHY LIVING These are some of my general health and wellness recommendations. Some of them may apply to you better than others. Please use common sense as you try these suggestions and feel free to ask me any questions.   ACTIVITY/FITNESS Mental, social, emotional and physical stimulation are very important for brain and body health. Try learning a new activity (arts, music, language, sports, games).  Keep moving your body to the best of your abilities. You can do this at home, inside or outside, the park, community center, gym or anywhere you like. Consider a physical therapist or personal trainer to get started. Consider the app Sworkit. Fitness trackers such as smart-watches, smart-phones or Fitbits can help as well.   NUTRITION Eat more plants: colorful vegetables, nuts, seeds and berries.  Eat less sugar, salt, preservatives and processed foods.  Avoid toxins such as cigarettes and alcohol.  Drink water when you are thirsty. Warm water with a slice of lemon is an excellent morning drink to start the day.  Consider these websites for more information The Nutrition Source (https://www.henry-hernandez.biz/) Precision Nutrition  (WindowBlog.ch)   RELAXATION Consider practicing mindfulness meditation or other relaxation techniques such as deep breathing, prayer, yoga, tai chi, massage. See website mindful.org or the apps Headspace or Calm to help get started.   SLEEP Try to get at least 7-8+ hours sleep per day. Regular exercise and reduced caffeine will help you sleep better. Practice good sleep hygeine techniques. See website sleep.org for more information.   PLANNING Prepare estate planning, living will, healthcare POA documents. Sometimes this is best planned with the help of an attorney. Theconversationproject.org and agingwithdignity.org are excellent resources.

## 2017-04-07 ENCOUNTER — Ambulatory Visit (INDEPENDENT_AMBULATORY_CARE_PROVIDER_SITE_OTHER): Payer: Medicare HMO | Admitting: Psychiatry

## 2017-04-07 ENCOUNTER — Encounter (HOSPITAL_COMMUNITY): Payer: Self-pay | Admitting: Psychiatry

## 2017-04-07 VITALS — BP 117/72 | HR 93 | Ht 72.0 in | Wt 222.0 lb

## 2017-04-07 DIAGNOSIS — Z79899 Other long term (current) drug therapy: Secondary | ICD-10-CM

## 2017-04-07 DIAGNOSIS — F331 Major depressive disorder, recurrent, moderate: Secondary | ICD-10-CM | POA: Diagnosis not present

## 2017-04-07 MED ORDER — DULOXETINE HCL 60 MG PO CPEP: 60.0000 mg | ORAL_CAPSULE | Freq: Every day | ORAL | 2 refills | Status: DC

## 2017-04-07 NOTE — Progress Notes (Signed)
Patient ID: Zachary [name], male   DOB: [birth year], 71 y.o.   MRN: [id number] Patient ID: Zachary [name], male   DOB: [birth year], 71 y.o.   MRN: [id number] Patient ID: Zachary [name], male   DOB: [birth year], 71 y.o.   MRN: [id number] Patient ID: Zachary [name], male   DOB: [birth year], 71 y.o.   MRN: [id number] Patient ID: Zachary [name], male   DOB: Dec 19, 1946, 71 y.o.   MRN: [id number] Patient ID: Zachary [name], male   DOB: [birth year], 71 y.o.   MRN: [id number] Patient ID: Zachary [name], male   DOB: Jun 10, 1946, 71 y.o.   MRN: [id number] Patient ID: Zachary [name], male   DOB: 24-Jan-1946, 71 y.o.   MRN: [id number] Patient ID: Zachary [name], male   DOB: [birth year], 71 y.o.   MRN: [id number] Patient ID: Zachary [name], male   DOB: [birth year], 71 y.o.   MRN: [id number] Patient ID: Zachary [name], male   DOB: Jun 12, 1946, 71 y.o.   MRN: [id number] [facility] [id number] Progress Note Zachary [name] MRN: [id number] DOB: 01/13/1946 Age: 71 y.o.  Date: 04/07/2017   Chief Complaint: Chief Complaint  Patient presents with  . Depression  . Anxiety  . Follow-up   Subjective: "I'm not much better"  This patient is a 71 year-old married white male who lives with his wife in [name] they have no children. 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  The patient states he got depressed several years ago after his diagnosed with stage IV bladder cancer. He was seeing a psychologist here to deal with this and last year was placed on Lexapro. His cancer is gone now after 5 surgeries. He recently had back surgery as well but is recovered from this with no pain. He states that his mood is been great. He's excited about the football season. He is sleeping well and his energy is good  The patient returns after 4 weeks. Last time He still wasn't feeling that well on Prozac so we have  switched him back to Cymbalta. His neurologist recently started him on Sinemet as well. Overall he is feeling better and his energy is improving. He is been doing more things around the house. He still has good and bad days but his wife has a better understanding now of his condition particular the parkinsonism. He is looking forward to football training this spring and summer. He takes Valium 5 mg only at bedtime    Vitals: BP 117/72 (BP Location: Right Arm, Patient Position: Sitting, Cuff Size: Large)   Pulse 93   Ht 6' (1.829 m)   Wt 222 lb (100.7 kg)   BMI 30.11 kg/m   Allergies: Allergies  Allergen Reactions  . Sulfonamide Derivatives Itching, Rash and Other (See Comments)    whelps   Medical History: Past Medical History:  Diagnosis Date  . Anxiety    takes Valium as needed  . Arthritis   . Bladder cancer ([place])    takes Rapaflo daily  . Blood dyscrasia    07/08/16: pt told he was a "free bleeder" after bleeding a lot when derm cut off mole on forehead. No excessive bleeding with minor wounds at home, no bleeding problems perioperatively with prior surgeries.  . Chronic back pain    spondylolisthesis  . Cluster headaches   . Depression  takes Lexapro daily  . Essential hypertension, benign    takes Diovan-HCT daily  . Joint pain   . Obstructive sleep apnea on CPAP    uses CPAP @ night  . Pneumonia    "several times"--last time about 3-4 yrs ago  . PONV (postoperative nausea and vomiting)    Hx: of "sometimes"  . Prediabetes   Patient has history of back pain, hypertension, tinnitus, Obstructive sleep apnea, knee surgery and back surgery. Patient also has bladder cancer and carpal tunnel release.  His primary care physician is Zachary [name] and he see Zachary [name] for his pain management.  Surgical History: Past Surgical History:  Procedure Laterality Date  . ANTERIOR CERVICAL DECOMP/DISCECTOMY FUSION N/A 06/05/2013   Procedure:  C3-4 Anterior Cervical Discectomy and  Fusion, Allograft, Plate;  Surgeon: Zachary [name];  Location: [place];  Service: Orthopedics;  Laterality: N/A;  C3-4 Anterior Cervical Discectomy and Fusion, Allograft, Plate  . Arthroscopic knee surgery    . BACK SURGERY      x 2  . BLADDER SURGERY     x 5 to remove tumor  . CARPAL TUNNEL RELEASE    . CATARACT EXTRACTION W/PHACO  12/19/2012   Procedure: CATARACT EXTRACTION PHACO AND INTRAOCULAR LENS PLACEMENT (IOC);  Surgeon: Zachary [name];  Location: [facility];  Service: Ophthalmology;  Laterality: Left;  CDE: 26.80  . CATARACT EXTRACTION W/PHACO  01/09/2013   Procedure: CATARACT EXTRACTION PHACO AND INTRAOCULAR LENS PLACEMENT (IOC);  Surgeon: Zachary [name];  Location: [facility];  Service: Ophthalmology;  Laterality: Right;  CDE:22.83  . CERVICAL FUSION  06/05/2013   C 3  C4  . CYSTOSCOPY    . REFRACTIVE SURGERY     Hx: of  . RETINAL DETACHMENT SURGERY    . TRANSURETHRAL RESECTION OF BLADDER TUMOR Right 07/27/2015   Procedure: TRANSURETHRAL RESECTION OF BLADDER TUMOR (TURBT);  Surgeon: Zachary [name];  Location: [facility];  Service: Urology;  Laterality: Right;   Family History: family history includes Alcohol abuse in his maternal grandfather and mother; Anxiety disorder in his sister; Cancer in his father; Depression in his mother; Hypertension in his father, mother, and sister. Reviewed and nothing is new except the neck surgery that is already on the surgical history.  Mental status examination Patient is casually dressed fairly groomed. He is Doesn't today today  He maintained fair eye contact. His speech is clear coherent and fluent. His thought process is logical linear and goal-directed. He denies any auditory or visual hallucination. He denies any active or passive suicidal thinking and homicidal thinking. He described his mood As good and his affect is brighter,His attention and concentration is good. He's alert and oriented x3. His insight judgment and impulse control is  okay.  Lab Results:  Results for orders placed or performed during the hospital encounter of 08/05/16 (from the past 8736 hour(s))  CBC   Collection Time: 08/05/16  7:30 AM  Result Value Ref Range   WBC 8.1 4.0 - 10.5 K/uL   RBC 3.47 (L) 4.22 - 5.81 MIL/uL   Hemoglobin 10.6 (L) 13.0 - 17.0 g/dL   HCT 32.1 (L) 39.0 - 52.0 %   MCV 92.5 78.0 - 100.0 fL   MCH 30.5 26.0 - 34.0 pg   MCHC 33.0 30.0 - 36.0 g/dL   RDW 14.2 11.5 - 15.5 %   Platelets 228 150 - 400 K/uL  Basic metabolic panel   Collection Time: 08/05/16  7:30 AM  Result Value Ref  Range   Sodium 139 135 - 145 mmol/L   Potassium 4.1 3.5 - 5.1 mmol/L   Chloride 103 101 - 111 mmol/L   CO2 27 22 - 32 mmol/L   Glucose, Bld 96 65 - 99 mg/dL   BUN 14 6 - 20 mg/dL   Creatinine, Ser 0.98 0.61 - 1.24 mg/dL   Calcium 8.5 (L) 8.9 - 10.3 mg/dL   GFR calc non Af Amer >60 >60 mL/min   GFR calc Af Amer >60 >60 mL/min   Anion gap 9 5 - 15  Results for orders placed or performed during the hospital encounter of 07/24/16 (from the past 8736 hour(s))  Basic metabolic panel   Collection Time: 07/24/16  4:11 PM  Result Value Ref Range   Sodium 134 (L) 135 - 145 mmol/L   Potassium 4.2 3.5 - 5.1 mmol/L   Chloride 102 101 - 111 mmol/L   CO2 25 22 - 32 mmol/L   Glucose, Bld 88 65 - 99 mg/dL   BUN 33 (H) 6 - 20 mg/dL   Creatinine, Ser 1.15 0.61 - 1.24 mg/dL   Calcium 8.3 (L) 8.9 - 10.3 mg/dL   GFR calc non Af Amer >60 >60 mL/min   GFR calc Af Amer >60 >60 mL/min   Anion gap 7 5 - 15  CBC with Differential   Collection Time: 07/24/16  4:11 PM  Result Value Ref Range   WBC 16.7 (H) 4.0 - 10.5 K/uL   RBC 3.71 (L) 4.22 - 5.81 MIL/uL   Hemoglobin 11.4 (L) 13.0 - 17.0 g/dL   HCT 34.0 (L) 39.0 - 52.0 %   MCV 91.6 78.0 - 100.0 fL   MCH 30.7 26.0 - 34.0 pg   MCHC 33.5 30.0 - 36.0 g/dL   RDW 14.8 11.5 - 15.5 %   Platelets 229 150 - 400 K/uL   Neutrophils Relative % 78 %   Neutro Abs 13.2 (H) 1.7 - 7.7 K/uL   Lymphocytes Relative 13 %    Lymphs Abs 2.2 0.7 - 4.0 K/uL   Monocytes Relative 6 %   Monocytes Absolute 0.9 0.1 - 1.0 K/uL   Eosinophils Relative 3 %   Eosinophils Absolute 0.5 0.0 - 0.7 K/uL   Basophils Relative 0 %   Basophils Absolute 0.0 0.0 - 0.1 K/uL  Urinalysis, Routine w reflex microscopic (not at [facility])   Collection Time: 07/24/16  4:15 PM  Result Value Ref Range   Color, Urine YELLOW YELLOW   APPearance CLEAR CLEAR   Specific Gravity, Urine 1.020 1.005 - 1.030   pH 5.5 5.0 - 8.0   Glucose, UA NEGATIVE NEGATIVE mg/dL   Hgb urine dipstick NEGATIVE NEGATIVE   Bilirubin Urine NEGATIVE NEGATIVE   Ketones, ur NEGATIVE NEGATIVE mg/dL   Protein, ur NEGATIVE NEGATIVE mg/dL   Nitrite NEGATIVE NEGATIVE   Leukocytes, UA NEGATIVE NEGATIVE  I-Stat CG4 Lactic Acid, ED   Collection Time: 07/24/16  4:41 PM  Result Value Ref Range   Lactic Acid, Venous 1.97 (HH) 0.5 - 1.9 mmol/L  I-Stat CG4 Lactic Acid, ED   Collection Time: 07/24/16  7:10 PM  Result Value Ref Range   Lactic Acid, Venous 1.54 0.5 - 1.9 mmol/L  Results for orders placed or performed during the hospital encounter of 07/22/16 (from the past 8736 hour(s))  CBC   Collection Time: 07/22/16  7:00 AM  Result Value Ref Range   WBC 14.2 (H) 4.0 - 10.5 K/uL   RBC 3.70 (L) 4.22 - 5.81  MIL/uL   Hemoglobin 11.3 (L) 13.0 - 17.0 g/dL   HCT 33.7 (L) 39.0 - 52.0 %   MCV 91.1 78.0 - 100.0 fL   MCH 30.5 26.0 - 34.0 pg   MCHC 33.5 30.0 - 36.0 g/dL   RDW 14.4 11.5 - 15.5 %   Platelets 197 150 - 400 K/uL  Basic metabolic panel   Collection Time: 07/22/16  7:00 AM  Result Value Ref Range   Sodium 136 135 - 145 mmol/L   Potassium 3.6 3.5 - 5.1 mmol/L   Chloride 103 101 - 111 mmol/L   CO2 28 22 - 32 mmol/L   Glucose, Bld 90 65 - 99 mg/dL   BUN 28 (H) 6 - 20 mg/dL   Creatinine, Ser 0.89 0.61 - 1.24 mg/dL   Calcium 7.8 (L) 8.9 - 10.3 mg/dL   GFR calc non Af Amer >60 >60 mL/min   GFR calc Af Amer >60 >60 mL/min   Anion gap 5 5 - 15  Hemoglobin A1c    Collection Time: 07/22/16  7:00 AM  Result Value Ref Range   Hgb A1c MFr Bld 5.8 (H) 4.8 - 5.6 %   Mean Plasma Glucose 120 mg/dL  Results for orders placed or performed during the hospital encounter of 07/15/16 (from the past 8736 hour(s))  I-STAT 7, (LYTES, BLD GAS, ICA, H+H)   Collection Time: 07/15/16 12:39 PM  Result Value Ref Range   pH, Arterial 7.281 (L) 7.350 - 7.450   pCO2 arterial 54.7 (H) 35.0 - 45.0 mmHg   pO2, Arterial 124.0 (H) 80.0 - 100.0 mmHg   Bicarbonate 26.3 (H) 20.0 - 24.0 mEq/L   TCO2 28 0 - 100 mmol/L   O2 Saturation 98.0 %   Acid-base deficit 2.0 0.0 - 2.0 mmol/L   Sodium 141 135 - 145 mmol/L   Potassium 3.4 (L) 3.5 - 5.1 mmol/L   Calcium, Ion 1.21 1.12 - 1.23 mmol/L   HCT 35.0 (L) 39.0 - 52.0 %   Hemoglobin 11.9 (L) 13.0 - 17.0 g/dL   Patient temperature 35.1 C    Sample type ARTERIAL   Troponin I   Collection Time: 07/15/16  3:27 PM  Result Value Ref Range   Troponin I <0.03 <0.03 ng/mL  Basic metabolic panel   Collection Time: 07/15/16  3:27 PM  Result Value Ref Range   Sodium 137 135 - 145 mmol/L   Potassium 3.1 (L) 3.5 - 5.1 mmol/L   Chloride 105 101 - 111 mmol/L   CO2 27 22 - 32 mmol/L   Glucose, Bld 177 (H) 65 - 99 mg/dL   BUN 9 6 - 20 mg/dL   Creatinine, Ser 1.15 0.61 - 1.24 mg/dL   Calcium 7.8 (L) 8.9 - 10.3 mg/dL   GFR calc non Af Amer >60 >60 mL/min   GFR calc Af Amer >60 >60 mL/min   Anion gap 5 5 - 15  Magnesium   Collection Time: 07/15/16  3:27 PM  Result Value Ref Range   Magnesium 1.3 (L) 1.7 - 2.4 mg/dL  CBC with Differential/Platelet   Collection Time: 07/15/16  3:27 PM  Result Value Ref Range   WBC 20.6 (H) 4.0 - 10.5 K/uL   RBC 3.82 (L) 4.22 - 5.81 MIL/uL   Hemoglobin 11.1 (L) 13.0 - 17.0 g/dL   HCT 34.1 (L) 39.0 - 52.0 %   MCV 89.3 78.0 - 100.0 fL   MCH 29.1 26.0 - 34.0 pg   MCHC 32.6 30.0 - 36.0 g/dL  RDW 13.4 11.5 - 15.5 %   Platelets 164 150 - 400 K/uL   Neutrophils Relative % 95 %   Neutro Abs 19.5 (H) 1.7 -  7.7 K/uL   Lymphocytes Relative 2 %   Lymphs Abs 0.4 (L) 0.7 - 4.0 K/uL   Monocytes Relative 3 %   Monocytes Absolute 0.6 0.1 - 1.0 K/uL   Eosinophils Relative 0 %   Eosinophils Absolute 0.0 0.0 - 0.7 K/uL   Basophils Relative 0 %   Basophils Absolute 0.0 0.0 - 0.1 K/uL  Phosphorus   Collection Time: 07/15/16  3:27 PM  Result Value Ref Range   Phosphorus 3.1 2.5 - 4.6 mg/dL  Calcium, ionized   Collection Time: 07/15/16  3:27 PM  Result Value Ref Range   Calcium, Ionized, Serum 4.7 4.5 - 5.6 mg/dL  ECHOCARDIOGRAM COMPLETE   Collection Time: 07/15/16  3:50 PM  Result Value Ref Range   Weight 3,584 oz   BP 163/86 mmHg   LV PW d 14.6 (A) 0.6 - 1.1 mm   FS 39 28 - 44 %   LA ID, A-P, ES 33 mm   IVS/LV PW RATIO, ED 1.12    Stroke v 41 ml   LVOT VTI 19.1 cm   Reg peak vel 189 cm/s   RV sys press 17 mmHg   LV e' LATERAL 5.33 cm/s   LV E/e' medial 12.48    LV E/e'average 12.48    LA diam index 1.47 cm/m2   LA vol A4C 47.5 ml   E decel time 458 msec   LVOT diameter 21 mm   LVOT area 3.46 cm2   LVOT peak vel 87.2 cm/s   LVOT SV 66 mL   E/e' ratio 12.48    MV pk E vel 66.5 m/s   TR max vel 189 cm/s   MV pk A vel 89.7 m/s   LV sys vol 24 21 - 61 mL   LV sys vol index 11 mL/m2   LV dias vol 65 62 - 150 mL   LV dias vol index 29 mL/m2   MV Dec 458    LA diam end sys 33 mm   Simpson's disk 63    TDI e' medial 5    TDI e' lateral 5.33    TAPSE 18.2 mm  Blood gas, arterial   Collection Time: 07/15/16  4:15 PM  Result Value Ref Range   FIO2 0.70    Delivery systems VENTILATOR    Mode PRESSURE REGULATED VOLUME CONTROL    VT 620 mL   LHR 14 resp/min   Peep/cpap 5.0 cm H20   pH, Arterial 7.415 7.350 - 7.450   pCO2 arterial 40.5 35.0 - 45.0 mmHg   pO2, Arterial 105 (H) 80.0 - 100.0 mmHg   Bicarbonate 26.0 (H) 20.0 - 24.0 mEq/L   TCO2 27.4 0 - 100 mmol/L   Acid-Base Excess 1.6 0.0 - 2.0 mmol/L   O2 Saturation 98.1 %   Patient temperature 95.5    Collection site A-LINE     Drawn by [id number]    Sample type ARTERIAL DRAW    Allens test (pass/fail) PASS PASS  Troponin I   Collection Time: 07/15/16  9:55 PM  Result Value Ref Range   Troponin I <0.03 <0.03 ng/mL  Blood gas, arterial   Collection Time: 07/16/16  3:42 AM  Result Value Ref Range   FIO2 0.50    Delivery systems VENTILATOR    Mode PRESSURE REGULATED VOLUME  CONTROL    VT 620 mL   LHR 14 resp/min   Peep/cpap 5.0 cm H20   pH, Arterial 7.420 7.350 - 7.450   pCO2 arterial 41.4 35.0 - 45.0 mmHg   pO2, Arterial 95.8 80.0 - 100.0 mmHg   Bicarbonate 26.3 (H) 20.0 - 24.0 mEq/L   TCO2 27.6 0 - 100 mmol/L   Acid-Base Excess 2.2 (H) 0.0 - 2.0 mmol/L   O2 Saturation 97.6 %   Patient temperature 98.6    Collection site A-LINE    Drawn by [phone]    Sample type ARTERIAL DRAW    Allens test (pass/fail) PASS PASS  Troponin I   Collection Time: 07/16/16  4:00 AM  Result Value Ref Range   Troponin I <0.03 <0.03 ng/mL  CBC with Differential/Platelet   Collection Time: 07/16/16  4:00 AM  Result Value Ref Range   WBC 17.2 (H) 4.0 - 10.5 K/uL   RBC 3.70 (L) 4.22 - 5.81 MIL/uL   Hemoglobin 10.8 (L) 13.0 - 17.0 g/dL   HCT 32.7 (L) 39.0 - 52.0 %   MCV 88.4 78.0 - 100.0 fL   MCH 29.2 26.0 - 34.0 pg   MCHC 33.0 30.0 - 36.0 g/dL   RDW 13.5 11.5 - 15.5 %   Platelets 153 150 - 400 K/uL   Neutrophils Relative % 93 %   Neutro Abs 16.0 (H) 1.7 - 7.7 K/uL   Lymphocytes Relative 5 %   Lymphs Abs 0.8 0.7 - 4.0 K/uL   Monocytes Relative 2 %   Monocytes Absolute 0.3 0.1 - 1.0 K/uL   Eosinophils Relative 0 %   Eosinophils Absolute 0.0 0.0 - 0.7 K/uL   Basophils Relative 0 %   Basophils Absolute 0.0 0.0 - 0.1 K/uL  Magnesium   Collection Time: 07/16/16  4:00 AM  Result Value Ref Range   Magnesium 1.4 (L) 1.7 - 2.4 mg/dL  Renal function panel   Collection Time: 07/16/16  4:00 AM  Result Value Ref Range   Sodium 138 135 - 145 mmol/L   Potassium 4.1 3.5 - 5.1 mmol/L   Chloride 106 101 - 111 mmol/L   CO2 26 22  - 32 mmol/L   Glucose, Bld 138 (H) 65 - 99 mg/dL   BUN 8 6 - 20 mg/dL   Creatinine, Ser 1.03 0.61 - 1.24 mg/dL   Calcium 8.1 (L) 8.9 - 10.3 mg/dL   Phosphorus 2.6 2.5 - 4.6 mg/dL   Albumin 3.0 (L) 3.5 - 5.0 g/dL   GFR calc non Af Amer >60 >60 mL/min   GFR calc Af Amer >60 >60 mL/min   Anion gap 6 5 - 15  Basic metabolic panel   Collection Time: 07/17/16  6:05 AM  Result Value Ref Range   Sodium 141 135 - 145 mmol/L   Potassium 4.2 3.5 - 5.1 mmol/L   Chloride 107 101 - 111 mmol/L   CO2 28 22 - 32 mmol/L   Glucose, Bld 150 (H) 65 - 99 mg/dL   BUN 15 6 - 20 mg/dL   Creatinine, Ser 1.08 0.61 - 1.24 mg/dL   Calcium 8.1 (L) 8.9 - 10.3 mg/dL   GFR calc non Af Amer >60 >60 mL/min   GFR calc Af Amer >60 >60 mL/min   Anion gap 6 5 - 15  CBC   Collection Time: 07/17/16  6:05 AM  Result Value Ref Range   WBC 18.2 (H) 4.0 - 10.5 K/uL   RBC 3.46 (L) 4.22 - 5.81  MIL/uL   Hemoglobin 10.1 (L) 13.0 - 17.0 g/dL   HCT 30.6 (L) 39.0 - 52.0 %   MCV 88.4 78.0 - 100.0 fL   MCH 29.2 26.0 - 34.0 pg   MCHC 33.0 30.0 - 36.0 g/dL   RDW 13.9 11.5 - 15.5 %   Platelets 179 150 - 400 K/uL  I-STAT 4, (NA,K, GLUC, HGB,HCT)   Collection Time: 07/17/16  6:02 PM  Result Value Ref Range   Sodium 144 135 - 145 mmol/L   Potassium 4.1 3.5 - 5.1 mmol/L   Glucose, Bld 138 (H) 65 - 99 mg/dL   HCT 27.0 (L) 39.0 - 52.0 %   Hemoglobin 9.2 (L) 13.0 - 17.0 g/dL  Results for orders placed or performed during the hospital encounter of 07/07/16 (from the past 8736 hour(s))  Surgical pcr screen   Collection Time: 07/07/16 11:30 AM  Result Value Ref Range   MRSA, PCR NEGATIVE NEGATIVE   Staphylococcus aureus NEGATIVE NEGATIVE  Basic metabolic panel   Collection Time: 07/07/16 11:30 AM  Result Value Ref Range   Sodium 141 135 - 145 mmol/L   Potassium 3.6 3.5 - 5.1 mmol/L   Chloride 107 101 - 111 mmol/L   CO2 26 22 - 32 mmol/L   Glucose, Bld 108 (H) 65 - 99 mg/dL   BUN 17 6 - 20 mg/dL   Creatinine, Ser 1.13 0.61 -  1.24 mg/dL   Calcium 8.9 8.9 - 10.3 mg/dL   GFR calc non Af Amer >60 >60 mL/min   GFR calc Af Amer >60 >60 mL/min   Anion gap 8 5 - 15  CBC   Collection Time: 07/07/16 11:30 AM  Result Value Ref Range   WBC 8.6 4.0 - 10.5 K/uL   RBC 4.91 4.22 - 5.81 MIL/uL   Hemoglobin 14.9 13.0 - 17.0 g/dL   HCT 45.0 39.0 - 52.0 %   MCV 91.6 78.0 - 100.0 fL   MCH 30.3 26.0 - 34.0 pg   MCHC 33.1 30.0 - 36.0 g/dL   RDW 14.3 11.5 - 15.5 %   Platelets 202 150 - 400 K/uL  Type and screen [place]   Collection Time: 07/07/16 11:40 AM  Result Value Ref Range   ABO/RH(D) O POS    Antibody Screen NEG    Sample Expiration 07/21/2016    Extend sample reason NO TRANSFUSIONS OR PREGNANCY IN THE PAST 3 MONTHS   ABO/Rh   Collection Time: 07/07/16 11:40 AM  Result Value Ref Range   ABO/RH(D) O POS   Results for orders placed or performed during the hospital encounter of 05/17/16 (from the past 8736 hour(s))  Basic metabolic panel   Collection Time: 05/17/16 10:50 AM  Result Value Ref Range   Sodium 138 135 - 145 mmol/L   Potassium 3.9 3.5 - 5.1 mmol/L   Chloride 106 101 - 111 mmol/L   CO2 26 22 - 32 mmol/L   Glucose, Bld 100 (H) 65 - 99 mg/dL   BUN 18 6 - 20 mg/dL   Creatinine, Ser 1.16 0.61 - 1.24 mg/dL   Calcium 9.1 8.9 - 10.3 mg/dL   GFR calc non Af Amer >60 >60 mL/min   GFR calc Af Amer >60 >60 mL/min   Anion gap 6 5 - 15  CBC   Collection Time: 05/17/16 10:50 AM  Result Value Ref Range   WBC 9.8 4.0 - 10.5 K/uL   RBC 5.24 4.22 - 5.81 MIL/uL   Hemoglobin 15.4  13.0 - 17.0 g/dL   HCT 47.0 39.0 - 52.0 %   MCV 89.7 78.0 - 100.0 fL   MCH 29.4 26.0 - 34.0 pg   MCHC 32.8 30.0 - 36.0 g/dL   RDW 13.9 11.5 - 15.5 %   Platelets 206 150 - 400 K/uL  I-stat troponin, ED   Collection Time: 05/17/16 10:54 AM  Result Value Ref Range   Troponin i, poc 0.00 0.00 - 0.08 ng/mL   Comment 3          Urinalysis, Routine w reflex microscopic   Collection Time: 05/17/16 12:07 PM  Result  Value Ref Range   Color, Urine YELLOW YELLOW   APPearance CLEAR CLEAR   Specific Gravity, Urine 1.020 1.005 - 1.030   pH 5.0 5.0 - 8.0   Glucose, UA NEGATIVE NEGATIVE mg/dL   Hgb urine dipstick MODERATE (A) NEGATIVE   Bilirubin Urine NEGATIVE NEGATIVE   Ketones, ur NEGATIVE NEGATIVE mg/dL   Protein, ur 30 (A) NEGATIVE mg/dL   Nitrite NEGATIVE NEGATIVE   Leukocytes, UA TRACE (A) NEGATIVE  Urine microscopic-add on   Collection Time: 05/17/16 12:07 PM  Result Value Ref Range   Squamous Epithelial / LPF 0-5 (A) NONE SEEN   WBC, UA 6-30 0 - 5 WBC/hpf   RBC / HPF 6-30 0 - 5 RBC/hpf   Bacteria, UA RARE (A) NONE SEEN   Casts HYALINE CASTS (A) NEGATIVE   Urine-Other MUCOUS PRESENT    Assessment Axis I Major depressive disorder, 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  I reviewed CC, tobacco/med/surg Hx, meds effects/ side effects, problem list, therapies and responses as well as current situation/symptoms discussed options. Patient will Continue Valium 5 mg a take it only at night. He will Continue  Cymbalta 60 mg daily He'll return to see me in 3 months See orders and pt instructions for more details.  MEDICATIONS this encounter: Meds ordered this encounter  Medications  . DULoxetine (CYMBALTA) 60 MG capsule    Sig: Take 1 capsule (60 mg total) by mouth daily.    Dispense:  30 capsule    Refill:  2    Medical Decision Making Problem Points:  Established problem, stable/improving (1), New problem, with no additional work-up planned (3), Review of last therapy session (1) and Review of psycho-social stressors (1) Data Points:  Review or order clinical lab tests (1) Review of medication regiment & side effects (2) Review of new medications or change in dosage (2)  I certify that outpatient services furnished can reasonably be expected to improve  the patient's condition.   [name], [name]

## 2017-04-08 ENCOUNTER — Telehealth (HOSPITAL_COMMUNITY): Payer: Self-pay | Admitting: *Deleted

## 2017-04-08 NOTE — Telephone Encounter (Signed)
Pt came into office stating that he was informed at the check in window that the provider he is seeing today 04-07-2017 is not in network with his insurance. Per pt he's been coming to this office since January 2018 with this insurance and this is the first he's heard about this. Per pt, if he has to pay for his visits other then the co-pay that's required from his insurance, he has to leave because he can not afford it. Staff then stated that staff was re-informed with an e-mail not that long ago that Dr. Harrington Challenger was still not in network with [employer]. Per pt, well then he will have to find another place to go. Per pt, when him and his wife got back together the beginning of this year, she was the one that suggested that he switched from [place] to the [place]. Informed pt that RMA faced this same situation with another patient and she had to call [facility] and request visit approval for that pt to continue seeing Dr. Harrington Challenger. RMA also informed pt that [facility] basically will approve a certain amount a visits in a year that pt can come into office to see provider. RMA stated to pt that she will work on this 04-08-2017 to find out how many visits they could approve for pt. RMA looked the back of pt insurance card to write down provider number and wrote down pt [employer] ID number to get a hold of a rep when staff calls. Number that was called the first time was [phone] at 12:15 pm on 04-07-2017. When RMA got a hold of a Rep Edison Nasuti), RMA informed of what staff was calling for. Edison Nasuti then stated that staff called the wrong place and he was going to provide staff with the right number she was to call just incase the call gets disconnected while she transfer staff. The number that was given was [phone]. When Edison Nasuti tried to dial the extension he was transferring staff to, the phone got disconnected. Staff then call the second number that was given and Gay Filler picked up at 12:24pm. Gay Filler then stated the same thing Edison Nasuti stated and  gived her another number which is [phone]. When that number was called, a gentleman picked up stating that staff had called [facility] in [facility] and provided staff with another number to call. Number that was given to staff was [phone]. RMA then proceeded to call that number and Shelly picked up stating that pt has [place] and another number was given at 12:32 pm but this number was a fax. Per Darrick Penna, staff needed to fax a request to [phone] with what staff wanted for pt or pt PCP could request this for pt and call was ended. Staff then proceeded to call the first number on the back of the patient insurance card. Juliann Pulse then picked up and stated this was the wrong location and pt needed a clinical evaluation for this request and was going to get someone on the phone for staff and provided another number [phone] opt 3,3. Then Metro Kung came on the phone and stated that there is a special team that deals with outpatient mental health visit prior auth but that name is called letter of Agreement. Staff spoke with Metro Kung at 12:58pm. Metro Kung then gived staff another number which was [phone]. Twanda then stated that this number was the provider relations number. Staff called that number and [place] picked up at 1:09pm. Staff then informed [place] of what staff wanted for pt. [place]  then put in a request for their Authorization team to take a look at and figure out if they are going to approve pt for visits with Dr. Harrington Challenger or [place] pt. [place] the informed staff that it takes up to 3 days to process request and 30 for a response from their team. Staff then told [place] that pt was in office for f/u with provider today and wanted to know who would be paying for this visit. [place] then stated that if pt is approved they would follow pt specific plan on how they will pay. But if pt is denied, pt would be the one responsible for the bill. Request was then processed and call was ended.

## 2017-04-12 ENCOUNTER — Other Ambulatory Visit (HOSPITAL_COMMUNITY): Payer: Self-pay | Admitting: Psychiatry

## 2017-04-12 DIAGNOSIS — M4807 Spinal stenosis, lumbosacral region: Secondary | ICD-10-CM | POA: Diagnosis not present

## 2017-04-12 DIAGNOSIS — M4800 Spinal stenosis, site unspecified: Secondary | ICD-10-CM | POA: Insufficient documentation

## 2017-04-12 DIAGNOSIS — M5137 Other intervertebral disc degeneration, lumbosacral region: Secondary | ICD-10-CM | POA: Diagnosis not present

## 2017-04-13 ENCOUNTER — Telehealth (HOSPITAL_COMMUNITY): Payer: Self-pay | Admitting: *Deleted

## 2017-04-13 ENCOUNTER — Other Ambulatory Visit (HOSPITAL_COMMUNITY): Payer: Self-pay | Admitting: Psychiatry

## 2017-04-13 MED ORDER — DIAZEPAM 5 MG PO TABS: 5.0000 mg | ORAL_TABLET | Freq: Two times a day (BID) | ORAL | 2 refills | Status: DC | PRN

## 2017-04-13 NOTE — Telephone Encounter (Signed)
Per Dr. Harrington Challenger to call in pt Valium to his pharmacy. Spoke with Edwena Blow at pt pharmacy who verbalized understanding.

## 2017-04-13 NOTE — Telephone Encounter (Signed)
Pt called stating he needs refills for his Valium. Per pt, on the bottle he is currently using, it stated he have 1 refills before March 2018. Per pt due to it saying March 2018, his pharmacy stated he would need another script. Informed pt that per his chart, provider printed a script for him on 02-01-2017 with 5 refills. Asked pt to please ask his wife because she was with him that day and try to look around to see if he can find script. Asked pt if he could also check with his pharmacy and see if his script is on hold with them. Per pt, his wife is out of town and he looked yesterday and could not find the script for 02-01-2017. Per pt, he called the pharmacy and they informed him that there was a script for 5 refills but is expired. Informed pt RMA will call his pharmacy to find out what's going on. Pt agreed. Called pt pharmacy and spoke with [facility] who stated they do not have script for 02-01-2017 and pt needs refills. Called pt and informed him that message will be sent to provider for more refills. Pt number is [phone].

## 2017-04-13 NOTE — Telephone Encounter (Signed)
Call ion 30 days with 2 refills

## 2017-04-13 NOTE — Telephone Encounter (Signed)
noted 

## 2017-04-13 NOTE — Telephone Encounter (Signed)
Per provider to call in 30 days with 2 refills of pt Valium. Called pt pharmacy and spoke with [facility] who verbalized understanding.

## 2017-04-19 ENCOUNTER — Encounter (HOSPITAL_COMMUNITY): Payer: Self-pay | Admitting: Physical Therapy

## 2017-04-19 NOTE — Therapy (Signed)
[facility] [place], [place], [address] Phone: [phone]   Fax:  [phone]  Patient Details  Name: Zachary [name] MRN: [id number] Date of Birth: [birth year] Referring Provider:  No ref. provider found  Encounter Date: 04/19/2017   PHYSICAL THERAPY DISCHARGE SUMMARY  Visits from Start of Care: 7  Current functional level related to goals / functional outcomes: Patient has not returned since last scheduled session.    Remaining deficits: Unable to assess    Education / Equipment: Patient has not returned since last scheduled session, unable to educate at this time  Plan: Patient agrees to discharge.  Patient goals were partially met. Patient is being discharged due to not returning since the last visit.  ?????       Deniece Ree PT, DPT [place] [address] [place], [place], [address] Phone: [phone]   Fax:  [phone]

## 2017-06-10 ENCOUNTER — Telehealth (HOSPITAL_COMMUNITY): Payer: Self-pay | Admitting: *Deleted

## 2017-06-10 NOTE — Telephone Encounter (Signed)
left voice message, provider out of office 06/24/17.

## 2017-06-24 ENCOUNTER — Ambulatory Visit (HOSPITAL_COMMUNITY): Payer: Self-pay | Admitting: Psychiatry

## 2017-07-12 DIAGNOSIS — M4807 Spinal stenosis, lumbosacral region: Secondary | ICD-10-CM | POA: Diagnosis not present

## 2017-07-26 DIAGNOSIS — Z6831 Body mass index (BMI) 31.0-31.9, adult: Secondary | ICD-10-CM | POA: Diagnosis not present

## 2017-07-26 DIAGNOSIS — M461 Sacroiliitis, not elsewhere classified: Secondary | ICD-10-CM | POA: Diagnosis not present

## 2017-07-26 DIAGNOSIS — I10 Essential (primary) hypertension: Secondary | ICD-10-CM | POA: Diagnosis not present

## 2017-07-26 DIAGNOSIS — M961 Postlaminectomy syndrome, not elsewhere classified: Secondary | ICD-10-CM | POA: Diagnosis not present

## 2017-07-27 ENCOUNTER — Other Ambulatory Visit (HOSPITAL_COMMUNITY): Payer: Self-pay | Admitting: Psychiatry

## 2017-07-28 ENCOUNTER — Ambulatory Visit (HOSPITAL_COMMUNITY): Payer: Self-pay | Admitting: Psychiatry

## 2017-07-28 DIAGNOSIS — M461 Sacroiliitis, not elsewhere classified: Secondary | ICD-10-CM | POA: Diagnosis not present

## 2017-07-28 DIAGNOSIS — I10 Essential (primary) hypertension: Secondary | ICD-10-CM | POA: Diagnosis not present

## 2017-07-28 DIAGNOSIS — Z6831 Body mass index (BMI) 31.0-31.9, adult: Secondary | ICD-10-CM | POA: Diagnosis not present

## 2017-07-29 DIAGNOSIS — D2111 Benign neoplasm of connective and other soft tissue of right upper limb, including shoulder: Secondary | ICD-10-CM | POA: Diagnosis not present

## 2017-07-29 DIAGNOSIS — D3611 Benign neoplasm of peripheral nerves and autonomic nervous system of face, head, and neck: Secondary | ICD-10-CM | POA: Diagnosis not present

## 2017-08-04 ENCOUNTER — Ambulatory Visit: Payer: Medicare HMO | Admitting: Diagnostic Neuroimaging

## 2017-08-17 DIAGNOSIS — H35432 Paving stone degeneration of retina, left eye: Secondary | ICD-10-CM | POA: Diagnosis not present

## 2017-08-17 DIAGNOSIS — H31092 Other chorioretinal scars, left eye: Secondary | ICD-10-CM | POA: Diagnosis not present

## 2017-08-17 DIAGNOSIS — H35373 Puckering of macula, bilateral: Secondary | ICD-10-CM | POA: Diagnosis not present

## 2017-09-21 ENCOUNTER — Telehealth (HOSPITAL_COMMUNITY): Payer: Self-pay | Admitting: *Deleted

## 2017-09-21 ENCOUNTER — Ambulatory Visit (INDEPENDENT_AMBULATORY_CARE_PROVIDER_SITE_OTHER): Payer: Medicare HMO | Admitting: Diagnostic Neuroimaging

## 2017-09-21 ENCOUNTER — Other Ambulatory Visit (HOSPITAL_COMMUNITY): Payer: Self-pay | Admitting: Psychiatry

## 2017-09-21 ENCOUNTER — Encounter: Payer: Self-pay | Admitting: Diagnostic Neuroimaging

## 2017-09-21 VITALS — BP 142/81 | HR 68 | Ht 72.0 in | Wt 228.2 lb

## 2017-09-21 DIAGNOSIS — R269 Unspecified abnormalities of gait and mobility: Secondary | ICD-10-CM | POA: Diagnosis not present

## 2017-09-21 DIAGNOSIS — G20 Parkinson's disease: Secondary | ICD-10-CM

## 2017-09-21 MED ORDER — DIAZEPAM 5 MG PO TABS: 5.0000 mg | ORAL_TABLET | Freq: Two times a day (BID) | ORAL | 2 refills | Status: DC | PRN

## 2017-09-21 MED ORDER — CARBIDOPA-LEVODOPA 25-100 MG PO TABS: 1.0000 | ORAL_TABLET | Freq: Three times a day (TID) | ORAL | 4 refills | Status: DC

## 2017-09-21 MED ORDER — DULOXETINE HCL 60 MG PO CPEP: 60.0000 mg | ORAL_CAPSULE | Freq: Every day | ORAL | 2 refills | Status: DC

## 2017-09-21 NOTE — Progress Notes (Signed)
[facility]  PATIENT: Zachary [name] DOB: 07/05/1946  REFERRING CLINICIAN: Merlyn Albert, MD HISTORY FROM: patient and wife REASON FOR VISIT: follow up   HISTORICAL  CHIEF COMPLAINT:  Chief Complaint  Patient presents with  . Follow-up  . Parkinson's    Asking about getting refills for his valium and cymbalta since Dr. Harrington Challenger out of network.   . Memory Loss    MMSE 28/30    HISTORY OF PRESENT ILLNESS:   UPDATE 09/21/17: Since last visit,   UPDATE 04/01/17: Since last visit has had MRI brain and c-spine, and had PT evaluation. Memory loss and gait issues are stable. Has life stress with concern about his mother's health. He is looking forward to football pre-season starting.   PRIOR HPI (01/15/17): 71 year old male here for evaluation of memory problems and gait difficulty. Patient reports memory problems for past 1 year. Wife thinks he has been having memory problem for at least 3 years. He is forgetting dates, fax, having trouble with comprehension, sleeping more. Also patient having more balance and walking problems. She has noticed that patient is taking small shuffling steps. 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 Following these 2 surgeries he had extensive physical therapy and rehabilitation. Patient attributes his balance problems to his low back surgery problem. Patient also has history of bladder cancer, hypertension, ringing in ears, depression, anxiety. Patient has had deterioration handwriting, irregular sleep, soft and hoarse voice. Patient having some intermittent tremor in his hands.   REVIEW OF SYSTEMS: Full 14 system review of systems performed and negative with exception of: depression anxiety ringing in ears runny nose apnea.   ALLERGIES: Allergies  Allergen Reactions  . Sulfonamide Derivatives Itching, Rash and Other (See Comments)    whelps     HOME MEDICATIONS: Outpatient Medications Prior to Visit  Medication Sig Dispense Refill  . acetaminophen (TYLENOL) 500 MG tablet Take 500 mg by mouth every 6 (six) hours as needed for mild pain or headache. For pain     . azithromycin (ZITHROMAX) 500 MG tablet Take by mouth daily. z pack (use as directed started today)    . carbidopa-levodopa (SINEMET IR) 25-100 MG tablet Take 1 tablet by mouth 3 (three) times daily before meals. 90 tablet 6  . carboxymethylcellulose (REFRESH PLUS) 0.5 % SOLN 1 drop daily as needed.    . diazepam (VALIUM) 5 MG tablet Take 1 tablet (5 mg total) by mouth 2 (two) times daily as needed for anxiety. 60 tablet 2  . DULoxetine (CYMBALTA) 60 MG capsule Take 1 capsule (60 mg total) by mouth daily. 30 capsule 2  . fluticasone (FLONASE) 50 MCG/ACT nasal spray Place 1 spray into both nostrils daily.    . Multiple Vitamin (MULTIVITAMIN WITH MINERALS) TABS tablet Take 1 tablet by mouth daily.    . valsartan-hydrochlorothiazide (DIOVAN-HCT) 320-12.5 MG tablet TK 1 T PO  QD  2   No facility-administered medications prior to visit.     PAST MEDICAL HISTORY: Past Medical History:  Diagnosis Date  . Anxiety    takes Valium as needed  . Arthritis   . Bladder cancer ([place])    takes Rapaflo daily  . Blood dyscrasia    07/08/16: pt told he was a "free bleeder" after bleeding a lot when derm cut off mole on forehead. No excessive bleeding with minor wounds at home, no bleeding problems perioperatively with prior surgeries.  Marland Kitchen  Chronic back pain    spondylolisthesis  . Cluster headaches   . Depression    takes Lexapro daily  . Essential hypertension, benign    takes Diovan-HCT daily  . Joint pain   . Obstructive sleep apnea on CPAP    uses CPAP @ night  . Pneumonia    "several times"--last time about 3-4 yrs ago  . PONV (postoperative nausea and vomiting)    Hx: of "sometimes"  . Prediabetes     PAST SURGICAL HISTORY: Past Surgical History:  Procedure  Laterality Date  . ANTERIOR CERVICAL DECOMP/DISCECTOMY FUSION N/A 06/05/2013   Procedure:  C3-4 Anterior Cervical Discectomy and Fusion, Allograft, Plate;  Surgeon: [name], MD;  Location: [facility];  Service: Orthopedics;  Laterality: N/A;  C3-4 Anterior Cervical Discectomy and Fusion, Allograft, Plate  . Arthroscopic knee surgery    . BACK SURGERY      x 2  . BLADDER SURGERY     x 5 to remove tumor  . CARPAL TUNNEL RELEASE    . CATARACT EXTRACTION W/PHACO  12/19/2012   Procedure: CATARACT EXTRACTION PHACO AND INTRAOCULAR LENS PLACEMENT (IOC);  Surgeon: [name], MD;  Location: [facility];  Service: Ophthalmology;  Laterality: Left;  CDE: 26.80  . CATARACT EXTRACTION W/PHACO  01/09/2013   Procedure: CATARACT EXTRACTION PHACO AND INTRAOCULAR LENS PLACEMENT (IOC);  Surgeon: [name], MD;  Location: [facility];  Service: Ophthalmology;  Laterality: Right;  CDE:22.83  . CERVICAL FUSION  06/05/2013   C 3  C4  . CYSTOSCOPY    . REFRACTIVE SURGERY     Hx: of  . RETINAL DETACHMENT SURGERY    . TRANSURETHRAL RESECTION OF BLADDER TUMOR Right 07/27/2015   Procedure: TRANSURETHRAL RESECTION OF BLADDER TUMOR (TURBT);  Surgeon: [name], MD;  Location: [facility];  Service: Urology;  Laterality: Right;  . wisdom tooth extraction      FAMILY HISTORY: Family History  Problem Relation Age of Onset  . Hypertension Father   . Cancer Father        Prostate  . Depression Mother   . Hypertension Mother   . Alcohol abuse Mother   . COPD Mother        passed 07-08-17  . Hypertension Sister   . Anxiety disorder Sister   . Alcohol abuse Maternal Grandfather   . ADD / ADHD Neg Hx   . Bipolar disorder Neg Hx   . Dementia Neg Hx   . Drug abuse Neg Hx   . OCD Neg Hx   . Paranoid behavior Neg Hx   . Schizophrenia Neg Hx   . Seizures Neg Hx   . Sexual abuse Neg Hx   . Physical abuse Neg Hx     SOCIAL HISTORY:  Social History   Social History  . Marital status: Married    Spouse name: [name]   .  Number of children: 0  . Years of education: 40   Occupational History  . Retired   .  Unemployed   Social History Main Topics  . Smoking status: Former Smoker    Quit date: 12/30/1976  . Smokeless tobacco: Never Used     Comment: 1985  . Alcohol use No     Comment: quit 1990  . Drug use: No  . Sexual activity: Yes   Other Topics Concern  . Not on file   Social History Narrative   Lives with wife    caffeine- sodas     PHYSICAL EXAM  GENERAL EXAM/CONSTITUTIONAL: Vitals:  Vitals:   09/21/17 0826  BP: (!) 142/81  Pulse: 68  Weight: 228 lb 3.2 oz (103.5 kg)  Height: 6' (1.829 m)   Body mass index is 30.95 kg/m. No exam data present  Patient is in no distress; well developed, nourished and groomed; neck is supple  FLAT AFFECT, MASKED FACIES  CARDIOVASCULAR:  Examination of carotid arteries is normal; no carotid bruits  Regular rate and rhythm, no murmurs  Examination of peripheral vascular system by observation and palpation is normal  EYES:  Ophthalmoscopic exam of optic discs and posterior segments is normal; no papilledema or hemorrhages  MUSCULOSKELETAL:  Gait, strength, tone, movements noted in Neurologic exam below  NEUROLOGIC: MENTAL STATUS:  MMSE - [place] Exam 09/21/2017 04/01/2017 01/15/2017  Orientation to time 5 4 5   Orientation to Place 4 5 5   Registration 3 3 3   Attention/ Calculation 4 4 4   Recall 3 2 3   Language- name 2 objects 2 2 2   Language- repeat 1 1 0  Language- follow 3 step command 3 3 3   Language- read & follow direction 1 1 1   Write a sentence 1 1 1   Copy design 1 1 0  Total score 28 27 27     awake, alert, oriented to person, place and time  recent and remote memory intact  normal attention and concentration  language fluent, comprehension intact, naming intact,   fund of knowledge appropriate  CRANIAL NERVE:   2nd - no papilledema on fundoscopic exam  2nd, 3rd, 4th, 6th - pupils equal and reactive  to light, visual fields full to confrontation, extraocular muscles intact, no nystagmus  5th - facial sensation symmetric  7th - facial strength symmetric  8th - hearing intact  9th - palate elevates symmetrically, uvula midline  11th - shoulder shrug symmetric  12th - tongue protrusion midline  SOFT HOARSE VOICE  MASKED FACIES  MOTOR:   normal bulk; full strength in the BUE, BLE  BRADYKINESIA IN RUE > LUE  BRADYKINESIA IN BLE  MILD TREMOR IN RUE > LUE WITH CONTRALATERAL RAM  SENSORY:   normal and symmetric to light touch, temperature, vibration  DECR TEMP IN LEFT HAND  DECR VIB IN LEFT FOOT  BILATERAL FOOT VIB < 5 SEC  COORDINATION:   finger-nose-finger, fine finger movements SLOW  REFLEXES:   deep tendon reflexes TRACE and symmetric  GAIT/STATION:   narrow based gait; STOOPED POSTURE; SLIGHT SCUFFING OF SHOES WITH WALKING     DIAGNOSTIC DATA (LABS, IMAGING, TESTING) - I reviewed patient records, labs, notes, testing and imaging myself where available.  Lab Results  Component Value Date   WBC 8.1 08/05/2016   HGB 10.6 (L) 08/05/2016   HCT 32.1 (L) 08/05/2016   MCV 92.5 08/05/2016   PLT 228 08/05/2016      Component Value Date/Time   NA 139 08/05/2016 0730   K 4.1 08/05/2016 0730   CL 103 08/05/2016 0730   CO2 27 08/05/2016 0730   GLUCOSE 96 08/05/2016 0730   BUN 14 08/05/2016 0730   CREATININE 0.98 08/05/2016 0730   CALCIUM 8.5 (L) 08/05/2016 0730   PROT 7.8 06/01/2013 1521   ALBUMIN 3.0 (L) 07/16/2016 0400   AST 16 06/01/2013 1521   ALT 21 06/01/2013 1521   ALKPHOS 110 06/01/2013 1521   BILITOT 0.2 (L) 06/01/2013 1521   GFRNONAA >60 08/05/2016 0730   GFRAA >60 08/05/2016 0730   No results found for: CHOL, HDL, LDLCALC, LDLDIRECT, TRIG,  CHOLHDL Lab Results  Component Value Date   HGBA1C 5.8 (H) 07/22/2016   No results found for: VITAMINB12 No results found for: TSH  01/28/12 MRI brain  - Nonspecific slightly atypical white  matter type changes as noted above. - No internal auditory canal or skull base abnormality is noted.  01/28/12 MRA head - Intracranial atherosclerotic type changes predominant involving branch vessels as detailed above.  01/28/12 MRV head - Loss of signal of the left transverse sinus probably is related to artifact rather than true stenosis. Overall, the major dural sinuses appear patent.   05/09/13 MRI cervical [I reviewed images myself and agree with interpretation. -VRP]  - Multilevel cervical spondylosis detailed above.  Progressive C3-C4 degenerative disc disease with flattening of the cervical cord. Moderate to severe central stenosis with 7 mm AP diameter of the thecal sac.  No cord edema.  C3-C4 further collapse of the disc space and the extrusion appears slightly larger than on the prior exam.  Other levels appear similar with multilevel mild central stenosis and bilateral foraminal stenosis.  In this patient with left upper extremity radiculopathy, the left-sided foraminal stenosis is most pronounced at C6-C7, potentially affecting the left C7 nerve.  06/12/16 MRI lumbar spine [I reviewed images myself and agree with interpretation. -VRP]  - Severe stenosis at L3-4 is multifactorial, related to central protrusion, posterior element hypertrophy and short pedicles affecting both L4 nerve roots. - 2 mm slip L4-5 associated with a central and leftward protrusion also with posterior element hypertrophy and short pedicles. Moderate to severe stenosis along with LEFT greater than RIGHT L5 nerve root impingement.  - No recurrent leftward protrusion at the L5-S1 level, however a central and rightward protrusion at L5-S1 could affect the RIGHT S1 nerve root.  12/23/16 CT head [I reviewed images myself and agree with interpretation. -VRP]  - Mild chronic ischemic white matter disease. No acute intracranial abnormality seen.  12/23/16 CT cervical [I reviewed images myself and agree with  interpretation. -VRP]  - Postsurgical and degenerative changes as described above. No acute abnormality seen in the cervical spine.  01/23/17 MRI of the brain without contrast shows the following: 1.     Moderate cortical atrophy, more pronounced in the mesial temporal lobes, which has significantly progressed when compared to the 01/28/2012 MRI. 2.    Mild to moderate chronic microvascular ischemic changes, mildly progressed when compared to the previous MRI. 3.    There are no acute findings.  01/23/17 MRI of the cervical spine without contrast shows the following: 1.    Since the MRI of the cervical spine dated 05/09/2013, the patient has undergone C3-C4 ACDF with resolution of the spinal stenosis noted at that time. 2.    There are multilevel degenerative changes resulting in mild spinal stenosis at C4-C5. Our various degrees of mild-to-moderate foraminal narrowing as detailed above that did not lead to nerve root compression. 3.    The spinal cord appears normal. 4.    There are no acute findings.     ASSESSMENT AND PLAN  71 y.o. year old male here with progressive memory problems, short-term recall issues, cognitive difficulty, balance and walking problems, tremor. Also with signs and symptoms of parkinsonism. Other contributing factors could include depression, chronic pain, degenerative spine disease, aging and deconditioning.   Ddx memory loss: depression, chronic pain, life stress, neurodegenerative  Ddx balance difficulty: parkinsonism, cervical myelopathy, lumbar radiculopathy  1. Parkinsonism, unspecified Parkinsonism type ([place])   2. Gait difficulty  PLAN:  PARKINSON'S DISESASE - continue carbidopa/levodopa 1 tab three times per day with meals - continue PT exercises - consider rollator walker or cane  DEPRESSION / ANXIETY - continue depression/anxiety treatments per psychiatry (or may transition to PCP)  Meds ordered this encounter  Medications  .  carbidopa-levodopa (SINEMET IR) 25-100 MG tablet    Sig: Take 1 tablet by mouth 3 (three) times daily before meals.    Dispense:  270 tablet    Refill:  4   Return in about 9 months (around 06/21/2018).    Penni Bombard, MD 4/49/2010, 0:71 AM Certified in Neurology, Neurophysiology and [facility] [address] [place], [place] [address] [phone]

## 2017-09-21 NOTE — Telephone Encounter (Signed)
Called in pt medication per previous phone call and provider.

## 2017-09-21 NOTE — Telephone Encounter (Signed)
Per Dr. Harrington Challenger to call in 30 days supply of pt Valium plus 2 refills. Called pt pharmacy and spoke with Condeaisha who verbalized understanding and stated they will get medication ready for pt.

## 2017-09-21 NOTE — Telephone Encounter (Signed)
Pt called stating he would like to know if Dr. Harrington Challenger could please fill his Valium and Cymbalta. Per pt during his last visit, he had discussed with D.r Harrington Challenger about how his insurance was informing him that Dr. Harrington Challenger was out of network with them. Staff called pt insurance to see if pt could get PA for visits and they stated that pt had to meet deductible first and pt stated he did not have money. Per pt, he will switch back to his previous insurance he had that will allow him to see Dr. Harrington Challenger in Jan 2019. Per pt but now, he can not f/u with provider until the. Per pt he still instead on f/u with provider and being a patient. Per pt if provider still can not allow him to have enough refills until then, he would like to know how much it will be to see if he have enough. Per pt he don't want to have more bills add on to his already resistant bills. Pt number is [phone].

## 2017-09-21 NOTE — Telephone Encounter (Signed)
cymbalta sent, please send in valium for 30 days plus 2 refills

## 2017-12-09 ENCOUNTER — Telehealth: Payer: Self-pay | Admitting: Orthopedic Surgery

## 2017-12-09 NOTE — Telephone Encounter (Signed)
No records  I ll se and xray  Needs history forms filled out

## 2017-12-09 NOTE — Telephone Encounter (Signed)
Patient/spouse (on contact list) called to request appointment for left knee pain and "possible evaluation for knee replacement" - states spoke with Dr Aline Brochure the other evening.  Relays that he had seen another orthopaedist several years ago ([facility]).  Said more recently had 'gel injections' at [facility].  Please advise regarding scheduling or if need to request records prior to scheduling.  Best ph# [phone]

## 2017-12-13 ENCOUNTER — Telehealth: Payer: Self-pay | Admitting: Diagnostic Neuroimaging

## 2017-12-13 NOTE — Telephone Encounter (Signed)
Called wife, Mardene Celeste on [place] to discuss. She stated her husband is "not doing well, has increased memory loss". This RN heard the patient in the background sounding angry and agitated. Per wife, patient is not seeing psychiatry and has not discussed depression with PCP. Wife stated this type language and behavior from patient is happening often.  Scheduled for FU 12/22/17; advised they arrive 30 minutes early to check in. Wife verbalized understanding, appreciation.

## 2017-12-13 NOTE — Telephone Encounter (Signed)
Patient's wife calling to get an appointment with Dr. Leta Baptist. She says has spoken to you recently and was told to call you back regarding an appointment. Patient is having problems with Parkinson's.

## 2017-12-14 NOTE — Telephone Encounter (Signed)
Spoke with patient; scheduled appointment; he is aware.

## 2017-12-15 ENCOUNTER — Encounter: Payer: Self-pay | Admitting: Orthopedic Surgery

## 2017-12-15 ENCOUNTER — Ambulatory Visit: Payer: Medicare HMO

## 2017-12-15 ENCOUNTER — Ambulatory Visit (INDEPENDENT_AMBULATORY_CARE_PROVIDER_SITE_OTHER): Payer: Medicare HMO

## 2017-12-15 ENCOUNTER — Ambulatory Visit: Payer: Medicare HMO | Admitting: Orthopedic Surgery

## 2017-12-15 VITALS — BP 154/84 | HR 78 | Ht 72.0 in | Wt 237.0 lb

## 2017-12-15 DIAGNOSIS — R209 Unspecified disturbances of skin sensation: Secondary | ICD-10-CM | POA: Diagnosis not present

## 2017-12-15 DIAGNOSIS — G8929 Other chronic pain: Secondary | ICD-10-CM

## 2017-12-15 DIAGNOSIS — M25572 Pain in left ankle and joints of left foot: Secondary | ICD-10-CM

## 2017-12-15 DIAGNOSIS — Z981 Arthrodesis status: Secondary | ICD-10-CM | POA: Diagnosis not present

## 2017-12-15 DIAGNOSIS — M25562 Pain in left knee: Secondary | ICD-10-CM | POA: Diagnosis not present

## 2017-12-15 DIAGNOSIS — M1712 Unilateral primary osteoarthritis, left knee: Secondary | ICD-10-CM

## 2017-12-15 MED ORDER — HYDROCODONE-ACETAMINOPHEN 5-325 MG PO TABS: 1.0000 | ORAL_TABLET | Freq: Three times a day (TID) | ORAL | 0 refills | Status: DC | PRN

## 2017-12-15 NOTE — Progress Notes (Signed)
Progress Note   Patient ID: Zachary [name], male   DOB: 1946-10-18, 71 y.o.   MRN: [id number]  Chief Complaint  Patient presents with  . NEW PROBLEM    LEFT KNEE AND ANKLE PAIN, FOOTS STAYS COLD     71 year old male with previous back fusion history of recently diagnosed Parkinson's disease status post multiple surgeries on his left knee presents with severe nonradiating dull burning aching medial joint line decreased range of motion poor balance with progressing pain over the last 6 months unrelieved by Tylenol     Review of Systems  Constitutional: Negative for fever.  Musculoskeletal: Positive for back pain.  Neurological: Positive for tremors, sensory change and focal weakness.   Current Meds  Medication Sig  . acetaminophen (TYLENOL) 500 MG tablet Take 500 mg by mouth every 6 (six) hours as needed for mild pain or headache. For pain   . carbidopa-levodopa (SINEMET IR) 25-100 MG tablet Take 1 tablet by mouth 3 (three) times daily before meals.  . diazepam (VALIUM) 5 MG tablet Take 1 tablet (5 mg total) by mouth 2 (two) times daily as needed for anxiety.  . DULoxetine (CYMBALTA) 60 MG capsule Take 1 capsule (60 mg total) by mouth daily.  . Multiple Vitamin (MULTIVITAMIN WITH MINERALS) TABS tablet Take 1 tablet by mouth daily.    Past Medical History:  Diagnosis Date  . Anxiety    takes Valium as needed  . Arthritis   . Bladder cancer ([place])    takes Rapaflo daily  . Blood dyscrasia    07/08/16: pt told he was a "free bleeder" after bleeding a lot when derm cut off mole on forehead. No excessive bleeding with minor wounds at home, no bleeding problems perioperatively with prior surgeries.  . Chronic back pain    spondylolisthesis  . Cluster headaches   . Depression    takes Lexapro daily  . Essential hypertension, benign    takes Diovan-HCT daily  . Joint pain   . Obstructive sleep apnea on CPAP    uses CPAP @ night  . Pneumonia    "several times"--last time about 3-4  yrs ago  . PONV (postoperative nausea and vomiting)    Hx: of "sometimes"  . Prediabetes      Allergies  Allergen Reactions  . Sulfonamide Derivatives Itching, Rash and Other (See Comments)    whelps    BP (!) 154/84   Pulse 78   Ht 6' (1.829 m)   Wt 237 lb (107.5 kg)   BMI 32.14 kg/m     Physical Exam  Ortho Exam General overall appearance is normal he is oriented x3 mood is normal his gait is shuffling with flat back with flexion knee contractures  His left knee is tender over the medial compartment there is no effusion.  He has a flexion contracture 10 degrees his knee flexion is 120 degrees his knee is stable in all planes muscle tone and strength are normal he has a slight upper extremity tremor his skin is warm dry and intact his foot is cool to touch she has poor pulse on the dorsalis pedis and posterior tib sensation in the foot is normal there is no lymphadenopathy in the groin on the left side    Medical decision-making  Imaging: X-ray shows severe arthritis medial compartment left knee  Encounter Diagnoses  Name Primary?  . Chronic pain of left knee   . Chronic pain of left ankle   . Primary osteoarthritis of left  knee Yes  . S/P lumbar fusion   . Cold left foot     We will get a preop clearance from his medical doctor Dr. Edwyna Ready hole and then we will also get an ABI to check his vascularity and then if all is normal we can set him up for left total knee.  Specific to his situation he is a high risk surgery patient for complications primarily related to stiffness of his knee.  I discussed this with him and his wife   The procedure has been fully reviewed with the patient; The risks and benefits of surgery have been discussed and explained and understood. Alternative treatment has also been reviewed, questions were encouraged and answered. The postoperative plan is also been reviewed.   Arther Abbott, MD 12/15/2017 3:16 PM

## 2017-12-15 NOTE — Patient Instructions (Signed)
You have decided to proceed with knee replacement surgery. You have decided not to continue with nonoperative measures such as but not limited to oral medication, weight loss, activity modification, physical therapy, bracing, or injection.  We will perform the procedure commonly known as total knee replacement. Some of the risks associated with knee replacement surgery include but are not limited to Bleeding Infection Swelling Stiffness Blood clot Pain that persists even after surgery  Infection is especially devastating complication of knee surgery although rare. If infection does occur your implant will usually have to be removed and several surgeries and antibiotics will be needed to eradicate the infection prior to performing a repeat replacement.   In some cases amputation is required to eradicate the infection. In other rare cases a knee fusion is needed    If you're not comfortable with these risks and would like to continue with nonoperative treatment please let Dr. Harrison know prior to your surgery.  

## 2017-12-16 ENCOUNTER — Encounter: Payer: Self-pay | Admitting: Radiology

## 2017-12-22 ENCOUNTER — Encounter: Payer: Self-pay | Admitting: Diagnostic Neuroimaging

## 2017-12-22 ENCOUNTER — Ambulatory Visit: Payer: Medicare HMO | Admitting: Diagnostic Neuroimaging

## 2017-12-22 VITALS — BP 136/77 | HR 101 | Ht 72.0 in | Wt 234.6 lb

## 2017-12-22 DIAGNOSIS — Z630 Problems in relationship with spouse or partner: Secondary | ICD-10-CM | POA: Diagnosis not present

## 2017-12-22 DIAGNOSIS — G20 Parkinson's disease: Secondary | ICD-10-CM

## 2017-12-22 DIAGNOSIS — R413 Other amnesia: Secondary | ICD-10-CM

## 2017-12-22 DIAGNOSIS — R269 Unspecified abnormalities of gait and mobility: Secondary | ICD-10-CM

## 2017-12-22 NOTE — Progress Notes (Signed)
[facility]  PATIENT: Zachary [name] DOB: 1946-03-11  REFERRING CLINICIAN: Merlyn Albert, MD HISTORY FROM: patient and wife REASON FOR VISIT: follow up   HISTORICAL  CHIEF COMPLAINT:  Chief Complaint  Patient presents with  . Memory Loss    Last MMSE: 28/30, Today: 30/30, Animals: 17    HISTORY OF PRESENT ILLNESS:   UPDATE (12/22/17, VRP): Since last visit, doing about the same. Wife is under high stress. She feels that patient has lost motivation. Patient feels fair. Tolerating meds. No alleviating or aggravating factors.   UPDATE 09/21/17: Since last visit, stable, but out of meds for anxiety. Needs a new psychiatry clinic.   UPDATE 04/01/17: Since last visit has had MRI brain and c-spine, and had PT evaluation. Memory loss and gait issues are stable. Has life stress with concern about his mother's health. He is looking forward to football pre-season starting.   PRIOR HPI (01/15/17): 71 year old male here for evaluation of memory problems and gait difficulty. Patient reports memory problems for past 1 year. Wife thinks he has been having memory problem for at least 3 years. He is forgetting dates, fax, having trouble with comprehension, sleeping more. Also patient having more balance and walking problems. She has noticed that patient is taking small shuffling steps. 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 Following these 2 surgeries he had extensive physical therapy and rehabilitation. Patient attributes his balance problems to his low back surgery problem. Patient also has history of bladder cancer, hypertension, ringing in ears, depression, anxiety. Patient has had deterioration handwriting, irregular sleep, soft and hoarse voice. Patient having some intermittent tremor in his hands.   REVIEW OF SYSTEMS: Full 14 system review of systems performed and negative with  exception of: depression anxiety ringing in ears dizziness.   ALLERGIES: Allergies  Allergen Reactions  . Sulfonamide Derivatives Itching, Rash and Other (See Comments)    whelps    HOME MEDICATIONS: Outpatient Medications Prior to Visit  Medication Sig Dispense Refill  . acetaminophen (TYLENOL) 500 MG tablet Take 500 mg by mouth every 6 (six) hours as needed for mild pain or headache. For pain     . carbidopa-levodopa (SINEMET IR) 25-100 MG tablet Take 1 tablet by mouth 3 (three) times daily before meals. 270 tablet 4  . carboxymethylcellulose (REFRESH PLUS) 0.5 % SOLN 1 drop daily as needed.    . diazepam (VALIUM) 5 MG tablet Take 1 tablet (5 mg total) by mouth 2 (two) times daily as needed for anxiety. 60 tablet 2  . DULoxetine (CYMBALTA) 60 MG capsule Take 1 capsule (60 mg total) by mouth daily. 30 capsule 2  . fluticasone (FLONASE) 50 MCG/ACT nasal spray Place 1 spray into both nostrils daily.    Marland Kitchen HYDROcodone-acetaminophen (NORCO/VICODIN) 5-325 MG tablet Take 1 tablet by mouth every 8 (eight) hours as needed for moderate pain. 30 tablet 0  . valsartan-hydrochlorothiazide (DIOVAN-HCT) 320-12.5 MG tablet TK 1 T PO  QD  2  . azithromycin (ZITHROMAX) 500 MG tablet Take by mouth daily. z pack (use as directed started today)    . Multiple Vitamin (MULTIVITAMIN WITH MINERALS) TABS tablet Take 1 tablet by mouth daily.     No facility-administered medications prior to visit.     PAST MEDICAL HISTORY: Past Medical History:  Diagnosis Date  . Anxiety    takes Valium as needed  . Arthritis   . Bladder cancer ([place])  takes Rapaflo daily  . Blood dyscrasia    07/08/16: pt told he was a "free bleeder" after bleeding a lot when derm cut off mole on forehead. No excessive bleeding with minor wounds at home, no bleeding problems perioperatively with prior surgeries.  . Chronic back pain    spondylolisthesis  . Cluster headaches   . Depression    takes Lexapro daily  . Essential  hypertension, benign    takes Diovan-HCT daily  . Joint pain   . Obstructive sleep apnea on CPAP    uses CPAP @ night  . Pneumonia    "several times"--last time about 3-4 yrs ago  . PONV (postoperative nausea and vomiting)    Hx: of "sometimes"  . Prediabetes     PAST SURGICAL HISTORY: Past Surgical History:  Procedure Laterality Date  . ANTERIOR CERVICAL DECOMP/DISCECTOMY FUSION N/A 06/05/2013   Procedure:  C3-4 Anterior Cervical Discectomy and Fusion, Allograft, Plate;  Surgeon: [name], MD;  Location: [facility];  Service: Orthopedics;  Laterality: N/A;  C3-4 Anterior Cervical Discectomy and Fusion, Allograft, Plate  . Arthroscopic knee surgery    . BACK SURGERY      x 2  . BLADDER SURGERY     x 5 to remove tumor  . CARPAL TUNNEL RELEASE    . CATARACT EXTRACTION W/PHACO  12/19/2012   Procedure: CATARACT EXTRACTION PHACO AND INTRAOCULAR LENS PLACEMENT (IOC);  Surgeon: [name], MD;  Location: [facility];  Service: Ophthalmology;  Laterality: Left;  CDE: 26.80  . CATARACT EXTRACTION W/PHACO  01/09/2013   Procedure: CATARACT EXTRACTION PHACO AND INTRAOCULAR LENS PLACEMENT (IOC);  Surgeon: [name], MD;  Location: [facility];  Service: Ophthalmology;  Laterality: Right;  CDE:22.83  . CERVICAL FUSION  06/05/2013   C 3  C4  . CYSTOSCOPY    . REFRACTIVE SURGERY     Hx: of  . RETINAL DETACHMENT SURGERY    . TRANSURETHRAL RESECTION OF BLADDER TUMOR Right 07/27/2015   Procedure: TRANSURETHRAL RESECTION OF BLADDER TUMOR (TURBT);  Surgeon: [name], MD;  Location: [facility];  Service: Urology;  Laterality: Right;  . wisdom tooth extraction      FAMILY HISTORY: Family History  Problem Relation Age of Onset  . Hypertension Father   . Cancer Father        Prostate  . Depression Mother   . Hypertension Mother   . Alcohol abuse Mother   . COPD Mother        passed 07-08-17  . Hypertension Sister   . Anxiety disorder Sister   . Alcohol abuse Maternal Grandfather   . ADD / ADHD Neg  Hx   . Bipolar disorder Neg Hx   . Dementia Neg Hx   . Drug abuse Neg Hx   . OCD Neg Hx   . Paranoid behavior Neg Hx   . Schizophrenia Neg Hx   . Seizures Neg Hx   . Sexual abuse Neg Hx   . Physical abuse Neg Hx     SOCIAL HISTORY:  Social History   Socioeconomic History  . Marital status: Married    Spouse name: [name]   . Number of children: 0  . Years of education: 29  . Highest education level: Not on file  Social Needs  . Financial resource strain: Not on file  . Food insecurity - worry: Not on file  . Food insecurity - inability: Not on file  . Transportation needs - medical: Not on file  . Transportation needs -  non-medical: Not on file  Occupational History  . Occupation: Retired    Fish farm manager: UNEMPLOYED  Tobacco Use  . Smoking status: Former Smoker    Last attempt to quit: 12/30/1976    Years since quitting: 41.0  . Smokeless tobacco: Never Used  . Tobacco comment: 1985  Substance and Sexual Activity  . Alcohol use: No    Comment: quit 1990  . Drug use: No  . Sexual activity: Yes  Other Topics Concern  . Not on file  Social History Narrative   Lives with wife    caffeine- sodas     PHYSICAL EXAM  GENERAL EXAM/CONSTITUTIONAL: Vitals:  Vitals:   12/22/17 1319  BP: 136/77  Pulse: (!) 101  Weight: 234 lb 9.6 oz (106.4 kg)  Height: 6' (1.829 m)   Body mass index is 31.82 kg/m. No exam data present  Patient is in no distress; well developed, nourished and groomed; neck is supple  FLAT AFFECT, MASKED FACIES  CARDIOVASCULAR:  Examination of carotid arteries is normal; no carotid bruits  Regular rate and rhythm, no murmurs  Examination of peripheral vascular system by observation and palpation is normal  EYES:  Ophthalmoscopic exam of optic discs and posterior segments is normal; no papilledema or hemorrhages  MUSCULOSKELETAL:  Gait, strength, tone, movements noted in Neurologic exam below  NEUROLOGIC: MENTAL STATUS:  MMSE - [place] Exam 12/22/2017 09/21/2017 04/01/2017  Orientation to time 5 5 4   Orientation to Place 5 4 5   Registration 3 3 3   Attention/ Calculation 5 4 4   Recall 3 3 2   Language- name 2 objects 2 2 2   Language- repeat 1 1 1   Language- follow 3 step command 3 3 3   Language- read & follow direction 1 1 1   Write a sentence 1 1 1   Copy design 1 1 1   Total score 30 28 27     awake, alert, oriented to person, place and time  recent and remote memory intact  normal attention and concentration  language fluent, comprehension intact, naming intact,   fund of knowledge appropriate  CRANIAL NERVE:   2nd - no papilledema on fundoscopic exam  2nd, 3rd, 4th, 6th - pupils equal and reactive to light, visual fields full to confrontation, extraocular muscles intact, no nystagmus  5th - facial sensation symmetric  7th - facial strength symmetric  8th - hearing intact  9th - palate elevates symmetrically, uvula midline  11th - shoulder shrug symmetric  12th - tongue protrusion midline  SOFT HOARSE VOICE  MASKED FACIES  MOTOR:   normal bulk; full strength in the BUE, BLE  BRADYKINESIA IN RUE > LUE  BRADYKINESIA IN BLE  MILD TREMOR IN RUE > LUE WITH CONTRALATERAL RAM  SENSORY:   normal and symmetric to light touch, temperature, vibration  DECR TEMP IN LEFT HAND  DECR VIB IN LEFT FOOT  BILATERAL FOOT VIB < 5 SEC  COORDINATION:   finger-nose-finger, fine finger movements SLOW  REFLEXES:   deep tendon reflexes TRACE and symmetric  GAIT/STATION:   narrow based gait; STOOPED POSTURE; SLIGHT SCUFFING OF SHOES WITH WALKING     DIAGNOSTIC DATA (LABS, IMAGING, TESTING) - I reviewed patient records, labs, notes, testing and imaging myself where available.  Lab Results  Component Value Date   WBC 8.1 08/05/2016   HGB 10.6 (L) 08/05/2016   HCT 32.1 (L) 08/05/2016   MCV 92.5 08/05/2016   PLT 228 08/05/2016      Component Value Date/Time   NA  139 08/05/2016 0730    K 4.1 08/05/2016 0730   CL 103 08/05/2016 0730   CO2 27 08/05/2016 0730   GLUCOSE 96 08/05/2016 0730   BUN 14 08/05/2016 0730   CREATININE 0.98 08/05/2016 0730   CALCIUM 8.5 (L) 08/05/2016 0730   PROT 7.8 06/01/2013 1521   ALBUMIN 3.0 (L) 07/16/2016 0400   AST 16 06/01/2013 1521   ALT 21 06/01/2013 1521   ALKPHOS 110 06/01/2013 1521   BILITOT 0.2 (L) 06/01/2013 1521   GFRNONAA >60 08/05/2016 0730   GFRAA >60 08/05/2016 0730   No results found for: CHOL, HDL, LDLCALC, LDLDIRECT, TRIG, CHOLHDL Lab Results  Component Value Date   HGBA1C 5.8 (H) 07/22/2016   No results found for: VITAMINB12 No results found for: TSH  01/28/12 MRI brain  - Nonspecific slightly atypical white matter type changes as noted above. - No internal auditory canal or skull base abnormality is noted.  01/28/12 MRA head - Intracranial atherosclerotic type changes predominant involving branch vessels as detailed above.  01/28/12 MRV head - Loss of signal of the left transverse sinus probably is related to artifact rather than true stenosis. Overall, the major dural sinuses appear patent.   05/09/13 MRI cervical [I reviewed images myself and agree with interpretation. -VRP]  - Multilevel cervical spondylosis detailed above.  Progressive C3-C4 degenerative disc disease with flattening of the cervical cord. Moderate to severe central stenosis with 7 mm AP diameter of the thecal sac.  No cord edema.  C3-C4 further collapse of the disc space and the extrusion appears slightly larger than on the prior exam.  Other levels appear similar with multilevel mild central stenosis and bilateral foraminal stenosis.  In this patient with left upper extremity radiculopathy, the left-sided foraminal stenosis is most pronounced at C6-C7, potentially affecting the left C7 nerve.  06/12/16 MRI lumbar spine [I reviewed images myself and agree with interpretation. -VRP]  - Severe stenosis at L3-4 is multifactorial, related to central  protrusion, posterior element hypertrophy and short pedicles affecting both L4 nerve roots. - 2 mm slip L4-5 associated with a central and leftward protrusion also with posterior element hypertrophy and short pedicles. Moderate to severe stenosis along with LEFT greater than RIGHT L5 nerve root impingement.  - No recurrent leftward protrusion at the L5-S1 level, however a central and rightward protrusion at L5-S1 could affect the RIGHT S1 nerve root.  12/23/16 CT head [I reviewed images myself and agree with interpretation. -VRP]  - Mild chronic ischemic white matter disease. No acute intracranial abnormality seen.  12/23/16 CT cervical [I reviewed images myself and agree with interpretation. -VRP]  - Postsurgical and degenerative changes as described above. No acute abnormality seen in the cervical spine.  01/23/17 MRI of the brain without contrast shows the following: 1.     Moderate cortical atrophy, more pronounced in the mesial temporal lobes, which has significantly progressed when compared to the 01/28/2012 MRI. 2.    Mild to moderate chronic microvascular ischemic changes, mildly progressed when compared to the previous MRI. 3.    There are no acute findings.  01/23/17 MRI of the cervical spine without contrast shows the following: 1.    Since the MRI of the cervical spine dated 05/09/2013, the patient has undergone C3-C4 ACDF with resolution of the spinal stenosis noted at that time. 2.    There are multilevel degenerative changes resulting in mild spinal stenosis at C4-C5. Our various degrees of mild-to-moderate foraminal narrowing as detailed above that did not  lead to nerve root compression. 3.    The spinal cord appears normal. 4.    There are no acute findings.     ASSESSMENT AND PLAN  71 y.o. year old male here with progressive memory problems, short-term recall issues, cognitive difficulty, balance and walking problems, tremor. Also with signs and symptoms of parkinsonism.  Other contributing factors could include depression, chronic pain, degenerative spine disease, aging and deconditioning.   Ddx memory loss: depression, chronic pain, life stress, neurodegenerative   Ddx balance difficulty: parkinsonism, cervical myelopathy, lumbar radiculopathy  No diagnosis found.    PLAN:  I spent 40 minutes of face to face time with patient. Greater than 50% of time was spent in counseling and coordination of care with patient. In summary we discussed:  PARKINSON'S DISESASE - continue carbidopa/levodopa 1 tab three times per day with meals - continue PT exercises - consider rollator walker or cane  DEPRESSION / ANXIETY - continue depression/anxiety treatments per psychiatry (or may transition to PCP)  [place] - recommend marriage counseling  Return in about 6 months (around 06/22/2018).    Penni Bombard, MD 72/15/8727, 6:18 PM Certified in Neurology, Neurophysiology and Neuroimaging  [facility] [address], [place], [place] [address] [phone]

## 2017-12-22 NOTE — Patient Instructions (Signed)
  PARKINSON'S DISESASE - continue carbidopa/levodopa 1 tab three times per day with meals - continue PT exercises - consider rollator walker or cane  DEPRESSION / ANXIETY - continue depression/anxiety treatments per psychiatry (or may transition to PCP)  [place] - recommend marriage counseling

## 2017-12-23 ENCOUNTER — Ambulatory Visit (HOSPITAL_COMMUNITY)
Admission: RE | Admit: 2017-12-23 | Discharge: 2017-12-23 | Disposition: A | Discharge status: Home / Self Care | Payer: Medicare HMO | Source: Ambulatory Visit | Attending: Orthopedic Surgery | Admitting: Orthopedic Surgery

## 2017-12-23 DIAGNOSIS — R9389 Abnormal findings on diagnostic imaging of other specified body structures: Secondary | ICD-10-CM | POA: Diagnosis not present

## 2017-12-23 DIAGNOSIS — R209 Unspecified disturbances of skin sensation: Secondary | ICD-10-CM | POA: Insufficient documentation

## 2017-12-27 ENCOUNTER — Other Ambulatory Visit (HOSPITAL_COMMUNITY): Payer: Self-pay | Admitting: Psychiatry

## 2017-12-30 ENCOUNTER — Telehealth: Payer: Self-pay | Admitting: Orthopedic Surgery

## 2017-12-30 NOTE — Telephone Encounter (Signed)
Please review / advise   IMPRESSION: Resting ankle-brachial index of the bilateral lower extremities likely elevated secondary to noncompressible vessels.  Segmental exam demonstrates no evidence of significant arterial occlusive disease, although likely with developing tibial disease on the left.  Signed,  Dulcy Fanny. Earleen Newport, DO  [facility]  [facility]

## 2017-12-30 NOTE — Telephone Encounter (Signed)
Patient called for results of ultrasound - ph#[phone]

## 2018-01-03 NOTE — Telephone Encounter (Signed)
Patient Called for results, I told him no significant vascular disease noted on the [place], but may have something early on the left, told him you are with patient, he wants you to call him back  [phone]

## 2018-01-04 ENCOUNTER — Other Ambulatory Visit: Payer: Self-pay | Admitting: Orthopedic Surgery

## 2018-01-04 ENCOUNTER — Telehealth: Payer: Self-pay | Admitting: Radiology

## 2018-01-04 NOTE — Telephone Encounter (Signed)
I have called him, will precert.

## 2018-01-04 NOTE — Telephone Encounter (Signed)
-----   Message from Carole Civil, MD sent at 01/04/2018  8:59 AM EST ----- Regarding: precert precert for feb 5   Call him to let him know the date

## 2018-01-06 NOTE — Telephone Encounter (Signed)
Faxed the precert information

## 2018-01-17 ENCOUNTER — Other Ambulatory Visit: Payer: Self-pay | Admitting: Orthopedic Surgery

## 2018-01-17 DIAGNOSIS — M1712 Unilateral primary osteoarthritis, left knee: Secondary | ICD-10-CM

## 2018-01-18 DIAGNOSIS — B356 Tinea cruris: Secondary | ICD-10-CM | POA: Diagnosis not present

## 2018-01-18 DIAGNOSIS — L0231 Cutaneous abscess of buttock: Secondary | ICD-10-CM | POA: Diagnosis not present

## 2018-01-18 DIAGNOSIS — Z6833 Body mass index (BMI) 33.0-33.9, adult: Secondary | ICD-10-CM | POA: Diagnosis not present

## 2018-01-24 ENCOUNTER — Telehealth: Payer: Self-pay | Admitting: Orthopedic Surgery

## 2018-01-24 NOTE — Telephone Encounter (Signed)
Surgery has been approved by insurance, Authorization number is [id number]   I called patient about [facility]. I will let Dr Aline Brochure know patient wants to go there after the surgery, but often his insurance is one that may be difficult to get placed in a facility.   To you FYI (I have put this in the surgery book)

## 2018-01-24 NOTE — Telephone Encounter (Signed)
Patient called, asking about possibility of discharge to rehab [facility]) following his upcoming surgery.  Aware that this will be addressed while at [facility], however, patient now states he has [employer] insurance.  Strongly encouraged patient to bring insurance card today for verification and pre-cert.

## 2018-01-25 ENCOUNTER — Other Ambulatory Visit (HOSPITAL_COMMUNITY): Payer: Self-pay | Admitting: Psychiatry

## 2018-01-25 NOTE — Patient Instructions (Signed)
Zachary [name]  01/25/2018     @PREFPERIOPPHARMACY @   Your procedure is scheduled on  02/01/2018 .  Report to [facility] at  615  A.M.  Call this number if you have problems the morning of surgery:  [phone]   Remember:  Do not eat food or drink liquids after midnight.  Take these medicines the morning of surgery with A SIP OF WATER  Sinemet, valium, cymbalta, diovan.   Do not wear jewelry, make-up or nail polish.  Do not wear lotions, powders, or perfumes, or deodorant.  Do not shave 48 hours prior to surgery.  Men may shave face and neck.  Do not bring valuables to the hospital.  [facility] is not responsible for any belongings or valuables.  Contacts, dentures or bridgework may not be worn into surgery.  Leave your suitcase in the car.  After surgery it may be brought to your room.  For patients admitted to the hospital, discharge time will be determined by your treatment team.  Patients discharged the day of surgery will not be allowed to drive home.   Name and phone number of your driver:   family Special instructions:  None  Please read over the following fact sheets that you were given. Pain Booklet, Coughing and Deep Breathing, Blood Transfusion Information, Total Joint Packet, MRSA Information, Surgical Site Infection Prevention, Anesthesia Post-op Instructions and Care and Recovery After Surgery                   Total Knee Replacement Total knee replacement is a procedure to replace the knee joint with an artificial (prosthetic) knee joint. The purpose of this surgery is to reduce knee pain and improve knee function. The prosthetic knee joint (prosthesis) may be made of metal, plastic or ceramic. It replaces parts of the thigh bone (femur), lower leg bone (tibia), and kneecap (patella) that are removed during the procedure. Tell a health care provider about:  Any allergies you have.  All medicines you are taking, including vitamins, herbs, eye  drops, creams, and over-the-counter medicines.  Any problems you or family members have had with anesthetic medicines.  Any blood disorders you have.  Any surgeries you have had.  Any medical conditions you have.  Whether you are pregnant or may be pregnant. What are the risks? Generally, this is a safe procedure. However, problems may occur, including:  Infection.  Bleeding.  Allergic reactions to medicines.  Damage to other structures or organs.  Decreased range of motion of the knee.  Instability of the knee.  Loosening of the prosthetic joint.  Knee pain that does not go away (chronic pain).  What happens before the procedure?  Ask your health care provider about: ? Changing or stopping your regular medicines. This is especially important if you are taking diabetes medicines or blood thinners. ? Taking medicines such as aspirin and ibuprofen. These medicines can thin your blood. Do not take these medicines before your procedure if your health care provider instructs you not to.  Have dental care and routine cleanings completed before your procedure. Plan to not have dental work done for 3 months after your procedure. Germs from anywhere in your body, including your mouth, can travel to your new joint and infect it.  Follow instructions from your health care provider about eating or drinking restrictions.  Ask your health care provider how your surgical site will be marked or identified.  You may be given antibiotic medicine to help  prevent infection.  If your health care provider prescribes physical therapy, do exercises as instructed.  Do not use any tobacco products, such as cigarettes, chewing tobacco, or e-cigarettes. If you need help quitting, ask your health care provider.  You may have a physical exam.  You may have tests, such as: ? X-rays. ? MRI. ? CT scan. ? Bone scans.  You may have a blood or urine sample taken.  Plan to have someone take you  home after the procedure.  If you will be going home right after the procedure, plan to have someone with you for at least 24 hours. It is recommended that you have someone to help care for you for at least 4-6 weeks after your procedure. What happens during the procedure?  To reduce your risk of infection: ? Your health care team will wash or sanitize their hands. ? Your skin will be washed with soap.  An IV tube will be inserted into one of your veins.  You will be given one or more of the following: ? A medicine to help you relax (sedative). ? A medicine to numb the area (local anesthetic). ? A medicine to make you fall asleep (general anesthetic). ? A medicine that is injected into your spine to numb the area below and slightly above the injection site (spinal anesthetic). ? A medicine that is injected into an area of your body to numb everything below the injection site (regional anesthetic).  An incision will be made in your knee.  Damaged cartilage and bone will be removed from your femur, tibia, and patella.  Parts of the prosthesis (liners) will be placed over the areas of bone and cartilage that were removed. A metal liner will be placed over your femur, and plastic liners will be placed over your tibia and the underside of your patella.  One or more small tubes (drains) may be placed near your incision to help drain extra fluid from your surgical site.  Your incision will be closed with stitches (sutures), skin glue, or adhesive strips. Medicine may be applied to your incision.  A bandage (dressing) will be placed over your incision. The procedure may vary among health care providers and hospitals. What happens after the procedure?  Your blood pressure, heart rate, breathing rate, and blood oxygen level will be monitored often until the medicines you were given have worn off.  You may continue to receive fluids and medicines through an IV tube.  You will have some pain.  Pain medicines will be available to help you.  You may have fluid coming from one or more drains in your incision.  You may have to wear compression stockings. These stockings help to prevent blood clots and reduce swelling in your legs.  You will be encouraged to move around as much as possible.  You may be given a continuous passive motion machine to use at home. You will be shown how to use this machine.  Do not drive for 24 hours if you received a sedative. This information is not intended to replace advice given to you by your health care provider. Make sure you discuss any questions you have with your health care provider. Document Released: 03/22/2001 Document Revised: 01/16/2017 Document Reviewed: 11/20/2015 Elsevier Interactive Patient Education  2018 [place].  Total Knee Replacement, Care After Refer to this sheet in the next few weeks. These instructions provide you with information about caring for yourself after your procedure. Your health care provider may also  give you more specific instructions. Your treatment has been planned according to current medical practices, but problems sometimes occur. Call your health care provider if you have any problems or questions after your procedure. What can I expect after the procedure? After the procedure, it is common to have:  Pain and swelling.  A small amount of blood or clear fluid coming from your incision.  Limited range of motion.  Follow these instructions at home: Medicines  Take over-the-counter and prescription medicines only as told by your health care provider.  If you were prescribed an antibiotic medicine, take it as told by your health care provider. Do not stop taking the antibiotic even if you start to feel better.  If you were prescribed a blood thinner (anticoagulant), take it as told by your health care provider. If you have a splint or brace:  Wear the immobilizer as told by your health care  provider. Remove it only as told by your health care provider.  Loosen the immobilizer if your toes tingle, become numb, or turn cold and blue.  Do not let your immobilizer get wet if it is not waterproof.  Keep the immobilizer clean. Bathing   Do not take baths, swim, or use a hot tub until your health care provider approves. Ask your health care provider if you can take showers. You may only be allowed to take sponge baths for bathing.  If you have an immobilizer that is not waterproof, cover it with a watertight covering when you take a bath or shower.  Keep your bandage (dressing) dry until your health care provider says it can be removed. Incision care and drain care  Check your incision area and drain site every day for signs of infection. Check for: ? More redness, swelling, or pain. ? More fluid or blood. ? Warmth. ? Pus or a bad smell.  Follow instructions from your health care provider about how to take care of your incision. Make sure you: ? Wash your hands with soap and water before you change your dressing. If soap and water are not available, use hand sanitizer. ? Change your dressing as told by your health care provider. ? Leave stitches (sutures), skin glue, or adhesive strips in place. These skin closures may need to stay in place for 2 weeks or longer. If adhesive strip edges start to loosen and curl up, you may trim the loose edges. Do not remove adhesive strips completely unless your health care provider tells you to do that.  If you have a drain, follow instructions from your health care provider about caring for it. Do not remove the drain tube or any dressings around the tube opening unless your health care provider approves. Managing pain, stiffness, and swelling  If directed, put ice on your knee. ? Put ice in a plastic bag. ? Place a towel between your skin and the bag. ? Leave the ice on for 20 minutes, 2-3 times per day.  If directed, apply heat to the  affected area as often as told by your health care provider. Use the heat source that your health care provider recommends, such as a moist heat pack or a heating pad. ? Place a towel between your skin and the heat source. ? Leave the heat on for 20-30 minutes. ? Remove the heat if your skin turns bright red. This is especially important if you are unable to feel pain, heat, or cold. You may have a greater risk of getting burned.  Move your toes often to avoid stiffness and to lessen swelling.  Raise (elevate) your knee above the level of your heart while you are sitting or lying down.  Wear elastic knee support for as long as told by your health care provider. Driving   Do not drive until your health care provider approves. Ask your health care provider when it is safe to drive if you have an immobilizer on your knee.  Do not drive or operate heavy machinery while taking prescription pain medicine.  Do not drive for 24 hours if you received a sedative. Activity  Do not lift anything that is heavier than 10 lb (4.5 kg) until your health care provider approves.  Do not play contact sports until your health care provider approves.  Avoid high-impact activities, including running, jumping rope, and jumping jacks.  Avoid sitting for a long time without moving. Get up and move around at least every few hours.  If physical therapy was prescribed, do exercises as told by your health care provider.  Return to your normal activities as told by your health care provider. Ask your health care provider what activities are safe for you. Safety  Do not use your leg to support your body weight until your health care provider approves. Use crutches or a walker as told by your health care provider. General instructions   Do not have any dental work done for at least 3 months after your surgery. When you do have dental work done, tell your dentist about your joint replacement.  Do not use any  tobacco products, such as cigarettes, chewing tobacco, or e-cigarettes. If you need help quitting, ask your health care provider.  Wear compression stockings as told by your health care provider. These stockings help to prevent blood clots and reduce swelling in your legs.  If you have been sent home with a continuous passive motion machine, use it as told by your health care provider.  Drink enough fluid to keep your urine clear or pale yellow.  If you have been instructed to lose weight, follow instructions from your health care provider about how to do this safely.  Keep all follow-up visits as told by your health care provider. This is important. Contact a health care provider if:  You have more redness, swelling, or pain around your incision or drain.  You have more fluid or blood coming from your incision or drain.  Your incision or drain site feels warm to the touch.  You have pus or a bad smell coming from your incision or drain.  You have a fever.  Your incision breaks open after your health care provider removes your sutures, skin glue, or adhesive tape.  Your prosthesis feels loose.  You have knee pain that does not go away. Get help right away if:  You have a rash.  You have pain or swelling in your calf or thigh.  You have shortness of breath or difficulty breathing.  You have chest pain.  Your range of motion in your knee is getting worse. This information is not intended to replace advice given to you by your health care provider. Make sure you discuss any questions you have with your health care provider. Document Released: 07/03/2005 Document Revised: 08/17/2016 Document Reviewed: 11/20/2015 Elsevier Interactive Patient Education  2018 [place].  Spinal Anesthesia and Epidural Anesthesia Spinal anesthesia and epidural anesthesia are ways to numb a part of the body. They are often used:  During childbirth.  During  surgery on certain parts of the  body. These include: ? Hip. ? Pelvis. ? Legs. ? Lower belly (abdomen).  After surgery on certain parts of the body. These include: ? Belly. ? Chest.  What happens before the procedure? Staying hydrated Follow instructions from your doctor about drinking (hydration). These may include:  Up to 2 hours before the procedure - you may continue to drink clear liquids. Some examples of clear liquids are: ? Water. ? Clear fruit juice. ? Black coffee. ? Plain tea.  Eating and drinking restrictions Follow instructions from your doctor about eating and drinking. These may include:  8 hours before the procedure - stop eating heavy meals or foods. Examples of these are meat, fried foods, or fatty foods.  6 hours before the procedure - stop eating light meals or foods. Examples of these are toast or cereal.  6 hours before the procedure - stop drinking milk or drinks that have milk in them.  2 hours before the procedure - stop drinking clear liquids.  Medicine Ask your doctor about:  Changing or stopping your regular medicines. This is especially important if you are taking diabetes medicines or blood thinners.  Changing or stopping your dietary supplements.  Taking medicines such as aspirin and ibuprofen. These medicines can thin your blood. Do not take these medicines before your procedure if your doctor tells you not to.  General instructions   Do not use any tobacco products for as long as possible. ? Examples of these are cigarettes, chewing tobacco, and e-cigarettes. ? If you need help quitting, ask your doctor.  Ask your doctor if you will have to stay overnight at the hospital or clinic.  If you will not have to stay overnight: ? Plan to have someone take you home. ? Plan to have someone with you for 24 hours. What happens during the procedure?  A doctor will put patches on your chest, a cuff around your arm, or a sensor device on your finger. They will be connected to  monitors.  An IV tube may be put into one of your veins. This tube is used to give fluids and medicines.  You may be given a medicine to help you relax (sedative).  You may be asked to: ? Sit up. ? Lie on your side. ? Bend your knees and chin toward your chest.  An area of your back will be cleaned.  You may get a shot of medicine in your back. The medicine will help prevent pain from the shot of numbing medicine.  A needle will be put between the bones of your back. While this is being done: ? Breathe normally. ? Try not to move. ? Stay as quiet as you can. ? Tell your doctor if you feel a tingling shock or pain going down your leg.  You will get the shot of numbing medicine.  If you need more medicine, a tube (catheter) may be put in the place where you got the shot. The tube will be used to give you more medicine. It will be left in if you need pain medicine after the procedure.  A bandage (dressing) may be put on your back. The procedure may vary among doctors and hospitals. What happens after the procedure?  Stay in bed until your doctor says it is safe to walk.  Your doctors will check on you often until the medicines wear off.  If there is a tube in your back, it will be taken out  when it is no longer needed.  Do not drive for 24 hours if you were given a medicine to help you relax (sedative).  It is common to feel sick to your stomach (nauseous) and itchy. There are medicines that can help. It is also common to: ? Be sleepy. ? Throw up (vomit). ? Have numbness or tingling in your legs. ? Have trouble peeing (urinating). This information is not intended to replace advice given to you by your health care provider. Make sure you discuss any questions you have with your health care provider. Document Released: 04/06/2016 Document Revised: 05/26/2016 Document Reviewed: 04/06/2016 Elsevier Interactive Patient Education  2018 [employer].  Spinal Anesthesia and  Epidural Anesthesia, Care After These instructions give you information about caring for yourself after your procedure. Your doctor may also give you more specific instructions. Call your doctor if you have any problems or questions after your procedure. Follow these instructions at home: For at least 24 hours after the procedure:   Do not: ? Do activities where you could fall or get [place] (injured). ? Drive. ? Use heavy machinery. ? Drink alcohol. ? Take sleeping pills or medicines that make you sleepy (drowsy). ? Make important decisions. ? Sign legal documents. ? Take care of children on your own.  Rest. Eating and drinking  If you throw up (vomit), drink water, juice, or soup when you can drink without throwing up.  Make sure you do not feel like throwing up (are not nauseous) before you eat.  Follow the diet that is recommended by your doctor. General instructions  Have a responsible adult stay with you until you are awake and alert.  Take over-the-counter and prescription medicines only as told by your doctor.  If you smoke, do not smoke unless an adult is watching.  Keep all follow-up visits as told by your doctor. This is important. Contact a doctor if:  It has been more than one day since your procedure and you feel like throwing up.  It has been more than one day since your procedure and you throw up.  You have a rash. Get help right away if:  You have a fever.  You have a headache that lasts a long time.  You have a very bad headache.  Your vision is blurry.  You see two of a single object (double vision).  You are dizzy or light-headed.  You faint.  Your arms or legs tingle, feel weak, or get numb.  You have trouble breathing.  You cannot pee (urinate). This information is not intended to replace advice given to you by your health care provider. Make sure you discuss any questions you have with your health care provider. Document Released:  04/06/2016 Document Revised: 08/06/2016 Document Reviewed: 04/06/2016 Elsevier Interactive Patient Education  2018 [place] Anesthesia, Adult General anesthesia is the use of medicines to make a person "go to sleep" (be unconscious) for a medical procedure. General anesthesia is often recommended when a procedure:  Is long.  Requires you to be still or in an unusual position.  Is major and can cause you to lose blood.  Is impossible to do without general anesthesia.  The medicines used for general anesthesia are called general anesthetics. In addition to making you sleep, the medicines:  Prevent pain.  Control your blood pressure.  Relax your muscles.  Tell a health care provider about:  Any allergies you have.  All medicines you are taking, including vitamins, herbs, eye  drops, creams, and over-the-counter medicines.  Any problems you or family members have had with anesthetic medicines.  Types of anesthetics you have had in the past.  Any bleeding disorders you have.  Any surgeries you have had.  Any medical conditions you have.  Any history of heart or lung conditions, such as heart failure, sleep apnea, or chronic obstructive pulmonary disease (COPD).  Whether you are pregnant or may be pregnant.  Whether you use tobacco, alcohol, marijuana, or street drugs.  Any history of Armed forces logistics/support/administrative officer.  Any history of depression or anxiety. What are the risks? Generally, this is a safe procedure. However, problems may occur, including:  Allergic reaction to anesthetics.  Lung and heart problems.  Inhaling food or liquids from your stomach into your lungs (aspiration).  Injury to nerves.  Waking up during your procedure and being unable to move (rare).  Extreme agitation or a state of mental confusion (delirium) when you wake up from the anesthetic.  Air in the bloodstream, which can lead to stroke.  These problems are more likely to develop if  you are having a major surgery or if you have an advanced medical condition. You can prevent some of these complications by answering all of your health care provider's questions thoroughly and by following all pre-procedure instructions. General anesthesia can cause side effects, including:  Nausea or vomiting  A sore throat from the breathing tube.  Feeling cold or shivery.  Feeling tired, washed out, or achy.  Sleepiness or drowsiness.  Confusion or agitation.  What happens before the procedure? Staying hydrated Follow instructions from your health care provider about hydration, which may include:  Up to 2 hours before the procedure - you may continue to drink clear liquids, such as water, clear fruit juice, black coffee, and plain tea.  Eating and drinking restrictions Follow instructions from your health care provider about eating and drinking, which may include:  8 hours before the procedure - stop eating heavy meals or foods such as meat, fried foods, or fatty foods.  6 hours before the procedure - stop eating light meals or foods, such as toast or cereal.  6 hours before the procedure - stop drinking milk or drinks that contain milk.  2 hours before the procedure - stop drinking clear liquids.  Medicines  Ask your health care provider about: ? Changing or stopping your regular medicines. This is especially important if you are taking diabetes medicines or blood thinners. ? Taking medicines such as aspirin and ibuprofen. These medicines can thin your blood. Do not take these medicines before your procedure if your health care provider instructs you not to. ? Taking new dietary supplements or medicines. Do not take these during the week before your procedure unless your health care provider approves them.  If you are told to take a medicine or to continue taking a medicine on the day of the procedure, take the medicine with sips of water. General instructions   Ask if  you will be going home the same day, the following day, or after a longer hospital stay. ? Plan to have someone take you home. ? Plan to have someone stay with you for the first 24 hours after you leave the hospital or clinic.  For 3-6 weeks before the procedure, try not to use any tobacco products, such as cigarettes, chewing tobacco, and e-cigarettes.  You may brush your teeth on the morning of the procedure, but make sure to spit out the toothpaste. What  happens during the procedure?  You will be given anesthetics through a mask and through an IV tube in one of your veins.  You may receive medicine to help you relax (sedative).  As soon as you are asleep, a breathing tube may be used to help you breathe.  An anesthesia specialist will stay with you throughout the procedure. He or she will help keep you comfortable and safe by continuing to give you medicines and adjusting the amount of medicine that you get. He or she will also watch your blood pressure, pulse, and oxygen levels to make sure that the anesthetics do not cause any problems.  If a breathing tube was used to help you breathe, it will be removed before you wake up. The procedure may vary among health care providers and hospitals. What happens after the procedure?  You will wake up, often slowly, after the procedure is complete, usually in a recovery area.  Your blood pressure, heart rate, breathing rate, and blood oxygen level will be monitored until the medicines you were given have worn off.  You may be given medicine to help you calm down if you feel anxious or agitated.  If you will be going home the same day, your health care provider may check to make sure you can stand, drink, and urinate.  Your health care providers will treat your pain and side effects before you go home.  Do not drive for 24 hours if you received a sedative.  You may: ? Feel nauseous and vomit. ? Have a sore throat. ? Have mental  slowness. ? Feel cold or shivery. ? Feel sleepy. ? Feel tired. ? Feel sore or achy, even in parts of your body where you did not have surgery. This information is not intended to replace advice given to you by your health care provider. Make sure you discuss any questions you have with your health care provider. Document Released: 03/22/2008 Document Revised: 05/26/2016 Document Reviewed: 11/28/2015 Elsevier Interactive Patient Education  2018 [employer] Anesthesia, Adult, Care After These instructions provide you with information about caring for yourself after your procedure. Your health care provider may also give you more specific instructions. Your treatment has been planned according to current medical practices, but problems sometimes occur. Call your health care provider if you have any problems or questions after your procedure. What can I expect after the procedure? After the procedure, it is common to have:  Vomiting.  A sore throat.  Mental slowness.  It is common to feel:  Nauseous.  Cold or shivery.  Sleepy.  Tired.  Sore or achy, even in parts of your body where you did not have surgery.  Follow these instructions at home: For at least 24 hours after the procedure:  Do not: ? Participate in activities where you could fall or become injured. ? Drive. ? Use heavy machinery. ? Drink alcohol. ? Take sleeping pills or medicines that cause drowsiness. ? Make important decisions or sign legal documents. ? Take care of children on your own.  Rest. Eating and drinking  If you vomit, drink water, juice, or soup when you can drink without vomiting.  Drink enough fluid to keep your urine clear or pale yellow.  Make sure you have little or no nausea before eating solid foods.  Follow the diet recommended by your health care provider. General instructions  Have a responsible adult stay with you until you are awake and alert.  Return to your normal  activities as told by your health care provider. Ask your health care provider what activities are safe for you.  Take over-the-counter and prescription medicines only as told by your health care provider.  If you smoke, do not smoke without supervision.  Keep all follow-up visits as told by your health care provider. This is important. Contact a health care provider if:  You continue to have nausea or vomiting at home, and medicines are not helpful.  You cannot drink fluids or start eating again.  You cannot urinate after 8-12 hours.  You develop a skin rash.  You have fever.  You have increasing redness at the site of your procedure. Get help right away if:  You have difficulty breathing.  You have chest pain.  You have unexpected bleeding.  You feel that you are having a life-threatening or urgent problem. This information is not intended to replace advice given to you by your health care provider. Make sure you discuss any questions you have with your health care provider. Document Released: 03/22/2001 Document Revised: 05/18/2016 Document Reviewed: 11/28/2015 Elsevier Interactive Patient Education  [employer].

## 2018-01-26 ENCOUNTER — Ambulatory Visit (INDEPENDENT_AMBULATORY_CARE_PROVIDER_SITE_OTHER): Payer: PPO | Admitting: Psychiatry

## 2018-01-26 ENCOUNTER — Encounter (HOSPITAL_COMMUNITY): Payer: Self-pay | Admitting: Psychiatry

## 2018-01-26 VITALS — BP 135/88 | HR 89 | Ht 72.0 in | Wt 228.0 lb

## 2018-01-26 DIAGNOSIS — F331 Major depressive disorder, recurrent, moderate: Secondary | ICD-10-CM

## 2018-01-26 DIAGNOSIS — R413 Other amnesia: Secondary | ICD-10-CM | POA: Diagnosis not present

## 2018-01-26 DIAGNOSIS — Z811 Family history of alcohol abuse and dependence: Secondary | ICD-10-CM | POA: Diagnosis not present

## 2018-01-26 DIAGNOSIS — Z560 Unemployment, unspecified: Secondary | ICD-10-CM | POA: Diagnosis not present

## 2018-01-26 DIAGNOSIS — R251 Tremor, unspecified: Secondary | ICD-10-CM | POA: Diagnosis not present

## 2018-01-26 DIAGNOSIS — Z818 Family history of other mental and behavioral disorders: Secondary | ICD-10-CM

## 2018-01-26 DIAGNOSIS — M2550 Pain in unspecified joint: Secondary | ICD-10-CM | POA: Diagnosis not present

## 2018-01-26 DIAGNOSIS — Z87891 Personal history of nicotine dependence: Secondary | ICD-10-CM | POA: Diagnosis not present

## 2018-01-26 MED ORDER — DULOXETINE HCL 60 MG PO CPEP: ORAL_CAPSULE | ORAL | 3 refills | Status: DC

## 2018-01-26 MED ORDER — DIAZEPAM 5 MG PO TABS: 5.0000 mg | ORAL_TABLET | Freq: Two times a day (BID) | ORAL | 3 refills | Status: DC | PRN

## 2018-01-26 NOTE — Progress Notes (Signed)
[facility] MD/PA/NP OP Progress Note  01/26/2018 10:33 AM Zachary [name]  MRN:  [id number]  Chief Complaint:  Chief Complaint    Depression; Anxiety; Memory Loss; Follow-up     JIR:CVEL patient is a 72 year-old married white male who lives with his wife in [place] they have no children. 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  The patient states he got depressed several years ago after his diagnosed with stage IV bladder cancer. He was seeing a psychologist here to deal with this and last year was placed on Lexapro. His cancer is gone now after 5 surgeries.   The patient returns after about 10 months.  He is doing much better than he was last year.  At that time he was more depressed withdrawn and his wife was very concerned that he was on too much Valium.  Since then he has been diagnosed with Parkinson's disease and is seeing a neurologist.  He is now on Sinemet 3 times a day and it seems to be making a big difference.  He no longer has any tremor and he is walking fairly easily.  However he is going to have left knee replacement next week and he thinks this will help him even more.  He states that in general his mood is good he is sleeping well.  The [employer] team that he helps one the state championship and he still very happy and excited about this.  He and his wife are getting along, they have joined a new church and he has made new friends.  He still feels like the Cymbalta and the small dose of Valium have been very helpful Visit Diagnosis:    ICD-10-CM   1. Major depressive disorder, recurrent episode, moderate (HCC) F33.1     Past Psychiatric History: Outpatient treatment for depression and anxiety for the last several years secondary to medical issues  Past Medical History:  Past Medical History:  Diagnosis Date  . Anxiety    takes Valium as needed   . Arthritis   . Bladder cancer ([place])    takes Rapaflo daily  . Blood dyscrasia    07/08/16: pt told he was a "free bleeder" after bleeding a lot when derm cut off mole on forehead. No excessive bleeding with minor wounds at home, no bleeding problems perioperatively with prior surgeries.  . Chronic back pain    spondylolisthesis  . Cluster headaches   . Depression    takes Lexapro daily  . Essential hypertension, benign    takes Diovan-HCT daily  . Joint pain   . Obstructive sleep apnea on CPAP    uses CPAP @ night  . Pneumonia    "several times"--last time about 3-4 yrs ago  . PONV (postoperative nausea and vomiting)    Hx: of "sometimes"  . Prediabetes     Past Surgical History:  Procedure Laterality Date  . ANTERIOR CERVICAL DECOMP/DISCECTOMY FUSION N/A 06/05/2013   Procedure:  C3-4 Anterior Cervical Discectomy and Fusion, Allograft, Plate;  Surgeon: [name], MD;  Location: [facility];  Service: Orthopedics;  Laterality: N/A;  C3-4 Anterior Cervical Discectomy and Fusion, Allograft, Plate  . Arthroscopic knee surgery    . BACK SURGERY      x 2  . BLADDER SURGERY     x 5 to remove tumor  . CARPAL TUNNEL RELEASE    .  CATARACT EXTRACTION W/PHACO  12/19/2012   Procedure: CATARACT EXTRACTION PHACO AND INTRAOCULAR LENS PLACEMENT (IOC);  Surgeon: [name], MD;  Location: [facility];  Service: Ophthalmology;  Laterality: Left;  CDE: 26.80  . CATARACT EXTRACTION W/PHACO  01/09/2013   Procedure: CATARACT EXTRACTION PHACO AND INTRAOCULAR LENS PLACEMENT (IOC);  Surgeon: [name], MD;  Location: [facility];  Service: Ophthalmology;  Laterality: Right;  CDE:22.83  . CERVICAL FUSION  06/05/2013   C 3  C4  . CYSTOSCOPY    . REFRACTIVE SURGERY     Hx: of  . RETINAL DETACHMENT SURGERY    . TRANSURETHRAL RESECTION OF BLADDER TUMOR Right 07/27/2015   Procedure: TRANSURETHRAL RESECTION OF BLADDER TUMOR (TURBT);  Surgeon: [name], MD;  Location: [facility];  Service: Urology;  Laterality:  Right;  . wisdom tooth extraction      Family Psychiatric History: See below  Family History:  Family History  Problem Relation Age of Onset  . Hypertension Father   . Cancer Father        Prostate  . Depression Mother   . Hypertension Mother   . Alcohol abuse Mother   . COPD Mother        passed 07-08-17  . Hypertension Sister   . Anxiety disorder Sister   . Alcohol abuse Maternal Grandfather   . ADD / ADHD Neg Hx   . Bipolar disorder Neg Hx   . Dementia Neg Hx   . Drug abuse Neg Hx   . OCD Neg Hx   . Paranoid behavior Neg Hx   . Schizophrenia Neg Hx   . Seizures Neg Hx   . Sexual abuse Neg Hx   . Physical abuse Neg Hx     Social History:  Social History   Socioeconomic History  . Marital status: Married    Spouse name: [name]   . Number of children: 0  . Years of education: 32  . Highest education level: None  Social Needs  . Financial resource strain: None  . Food insecurity - worry: None  . Food insecurity - inability: None  . Transportation needs - medical: None  . Transportation needs - non-medical: None  Occupational History  . Occupation: Retired    Fish farm manager: UNEMPLOYED  Tobacco Use  . Smoking status: Former Smoker    Last attempt to quit: 12/30/1976    Years since quitting: 41.1  . Smokeless tobacco: Never Used  . Tobacco comment: 1985  Substance and Sexual Activity  . Alcohol use: No    Comment: quit 1990  . Drug use: No  . Sexual activity: Yes  Other Topics Concern  . None  Social History Narrative   Lives with wife    caffeine- sodas    Allergies:  Allergies  Allergen Reactions  . Sulfonamide Derivatives Itching, Rash and Other (See Comments)    welts    Metabolic Disorder Labs: Lab Results  Component Value Date   HGBA1C 5.8 (H) 07/22/2016   MPG 120 07/22/2016   No results found for: PROLACTIN No results found for: CHOL, TRIG, HDL, CHOLHDL, VLDL, LDLCALC No results found for: TSH  Therapeutic Level Labs: No results found for:  LITHIUM No results found for: VALPROATE No components found for:  CBMZ  Current Medications: Current Outpatient Medications  Medication Sig Dispense Refill  . acetaminophen (TYLENOL) 500 MG tablet Take 500-1,000 mg by mouth every 6 (six) hours as needed (for pain.).    Marland Kitchen carbidopa-levodopa (SINEMET IR) 25-100 MG tablet Take 1  tablet by mouth 3 (three) times daily before meals. 270 tablet 4  . carboxymethylcellulose (REFRESH PLUS) 0.5 % SOLN Place 1 drop into both eyes 3 (three) times daily as needed (for dry eyes.).     Marland Kitchen diazepam (VALIUM) 5 MG tablet Take 1 tablet (5 mg total) by mouth 2 (two) times daily as needed. for anxiety 60 tablet 3  . DULoxetine (CYMBALTA) 60 MG capsule TAKE 1 CAPSULE(60 MG) BY MOUTH DAILY 30 capsule 3  . fluticasone (FLONASE) 50 MCG/ACT nasal spray Place 1-2 sprays into both nostrils daily as needed (for allergies.).     Marland Kitchen HYDROcodone-acetaminophen (NORCO/VICODIN) 5-325 MG tablet Take 1 tablet by mouth every 8 (eight) hours as needed for moderate pain. 30 tablet 0  . ibuprofen (ADVIL,MOTRIN) 200 MG tablet Take 400 mg by mouth every 8 (eight) hours as needed (for pain.).    Marland Kitchen valsartan-hydrochlorothiazide (DIOVAN-HCT) 320-12.5 MG tablet Take 1 tablet by mouth daily.     No current facility-administered medications for this visit.      Musculoskeletal: Strength & Muscle Tone: decreased Gait & Station: unsteady Patient leans: N/A  Psychiatric Specialty Exam: Review of Systems  Musculoskeletal: Positive for joint pain.  Neurological: Positive for tremors.  Psychiatric/Behavioral: Positive for memory loss.  All other systems reviewed and are negative.   Blood pressure 135/88, pulse 89, height 6' (1.829 m), weight 228 lb (103.4 kg), SpO2 96 %.Body mass index is 30.92 kg/m.  General Appearance: Casual, Neat and Well Groomed  Eye Contact:  Good  Speech:  Clear and Coherent  Volume:  Normal  Mood:  Euthymic  Affect:  Congruent  Thought Process:  Goal  Directed  Orientation:  Full (Time, Place, and Person)  Thought Content: WDL   Suicidal Thoughts:  No  Homicidal Thoughts:  No  Memory:  Immediate;   Good Recent;   Fair Remote;   Fair  Judgement:  Fair  Insight:  Fair  Psychomotor Activity:  Decreased  Concentration:  Concentration: Good and Attention Span: Good  Recall:  [place] of Knowledge: Good  Language: Good  Akathisia:  No  Handed:  Right  AIMS (if indicated): not done  Assets:  Communication Skills Desire for Improvement Resilience Social Support Talents/Skills  ADL's:  Intact  Cognition: WNL  Sleep:  Good   Screenings: Mini-Mental     Office Visit from 12/22/2017 in [facility] Office Visit from 09/21/2017 in [facility] Office Visit from 04/01/2017 in [facility] Office Visit from 01/15/2017 in [facility]  Total Score (max 30 points )  30  28  27  27        Assessment and Plan: This patient is a 72 year old male with a history of depression and anxiety slight memory loss and parkinsonism.  He is actually doing much better than I seen him in a while.  He will continue Cymbalta 60 mg daily for depression and Valium 5 mg up to twice a day as needed for anxiety.  He will continue his treatment through the neurologist for the Parkinson's disease.  Since he is about to have surgery he will return to see me in 4 months.   [name], MD 01/26/2018, 10:33 AM

## 2018-01-27 ENCOUNTER — Encounter (HOSPITAL_COMMUNITY)
Admission: RE | Admit: 2018-01-27 | Discharge: 2018-01-27 | Disposition: A | Discharge status: Home / Self Care | Payer: PPO | Source: Ambulatory Visit | Attending: Orthopedic Surgery | Admitting: Orthopedic Surgery

## 2018-01-27 ENCOUNTER — Encounter (HOSPITAL_COMMUNITY): Payer: Self-pay

## 2018-01-27 DIAGNOSIS — Z01812 Encounter for preprocedural laboratory examination: Secondary | ICD-10-CM | POA: Insufficient documentation

## 2018-01-27 DIAGNOSIS — Z01810 Encounter for preprocedural cardiovascular examination: Secondary | ICD-10-CM | POA: Insufficient documentation

## 2018-01-27 DIAGNOSIS — R9431 Abnormal electrocardiogram [ECG] [EKG]: Secondary | ICD-10-CM | POA: Diagnosis not present

## 2018-01-27 HISTORY — DX: Other specified personal risk factors, not elsewhere classified: Z91.89

## 2018-01-27 LAB — COMPREHENSIVE METABOLIC PANEL
ALBUMIN: 3.6 g/dL (ref 3.5–5.0)
ALK PHOS: 85 U/L (ref 38–126)
ALT: 8 U/L — ABNORMAL LOW (ref 17–63)
ANION GAP: 11 (ref 5–15)
AST: 20 U/L (ref 15–41)
BUN: 17 mg/dL (ref 6–20)
CALCIUM: 9 mg/dL (ref 8.9–10.3)
CO2: 26 mmol/L (ref 22–32)
Chloride: 104 mmol/L (ref 101–111)
Creatinine, Ser: 1.08 mg/dL (ref 0.61–1.24)
GFR calc Af Amer: >60 mL/min (ref >60)
GFR calc non Af Amer: >60 mL/min (ref >60)
GLUCOSE: 99 mg/dL (ref 65–99)
POTASSIUM: 3.4 mmol/L — AB (ref 3.5–5.1)
SODIUM: 141 mmol/L (ref 135–145)
Total Bilirubin: 0.7 mg/dL (ref 0.3–1.2)
Total Protein: 7.2 g/dL (ref 6.5–8.1)

## 2018-01-27 LAB — CBC WITH DIFFERENTIAL/PLATELET
Basophils Absolute: 0 10*3/uL (ref 0.0–0.1)
Basophils Relative: 0 %
EOS PCT: 1 %
Eosinophils Absolute: 0.1 10*3/uL (ref 0.0–0.7)
HEMATOCRIT: 46.8 % (ref 39.0–52.0)
Hemoglobin: 15 g/dL (ref 13.0–17.0)
LYMPHS ABS: 2.4 10*3/uL (ref 0.7–4.0)
LYMPHS PCT: 29 %
MCH: 29.4 pg (ref 26.0–34.0)
MCHC: 32.1 g/dL (ref 30.0–36.0)
MCV: 91.6 fL (ref 78.0–100.0)
MONO ABS: 0.5 10*3/uL (ref 0.1–1.0)
Monocytes Relative: 7 %
NEUTROS ABS: 5.1 10*3/uL (ref 1.7–7.7)
Neutrophils Relative %: 63 %
Platelets: 213 10*3/uL (ref 150–400)
RBC: 5.11 MIL/uL (ref 4.22–5.81)
RDW: 13.3 % (ref 11.5–15.5)
WBC: 8.2 10*3/uL (ref 4.0–10.5)

## 2018-01-27 LAB — ABO/RH: ABO/RH(D): O POS

## 2018-01-27 LAB — PROTIME-INR
INR: 0.98
Prothrombin Time: 12.9 seconds (ref 11.4–15.2)

## 2018-01-27 LAB — SURGICAL PCR SCREEN
MRSA, PCR: NEGATIVE
Staphylococcus aureus: NEGATIVE

## 2018-01-27 LAB — PREPARE RBC (CROSSMATCH)

## 2018-01-28 NOTE — Progress Notes (Signed)
Dr Patsey Berthold aware of Hypotensive event 07/15/2016 during back surgery and reviewed chart.  No orders given.

## 2018-01-31 MED ORDER — CHLORHEXIDINE GLUCONATE 4 % EX LIQD: 60.0000 mL | Freq: Once | CUTANEOUS | Status: DC

## 2018-01-31 MED ORDER — TRANEXAMIC ACID 1000 MG/10ML IV SOLN
1000.0000 mg | INTRAVENOUS | Status: AC
  Administered 2018-02-01: 1000 mg via INTRAVENOUS
  Filled 2018-01-31: qty 10

## 2018-01-31 NOTE — H&P (Signed)
Patient ID: Zachary [name], male   DOB: December 02, 1946, 72 y.o.   MRN: [id number]       Chief Complaint  Patient presents with  . NEW PROBLEM      LEFT KNEE AND ANKLE PAIN, FOOTS STAYS COLD       72 year old male with previous back fusion history of recently diagnosed Parkinson's disease status post multiple surgeries on his left knee presents with severe nonradiating dull burning aching medial joint line decreased range of motion poor balance with progressing pain over the last 6 months unrelieved by Tylenol         Review of Systems  Constitutional: Negative for fever.  Musculoskeletal: Positive for back pain.  Neurological: Positive for tremors, sensory change and focal weakness.        Current Meds  Medication Sig  . acetaminophen (TYLENOL) 500 MG tablet Take 500 mg by mouth every 6 (six) hours as needed for mild pain or headache. For pain   . carbidopa-levodopa (SINEMET IR) 25-100 MG tablet Take 1 tablet by mouth 3 (three) times daily before meals.  . diazepam (VALIUM) 5 MG tablet Take 1 tablet (5 mg total) by mouth 2 (two) times daily as needed for anxiety.  . DULoxetine (CYMBALTA) 60 MG capsule Take 1 capsule (60 mg total) by mouth daily.  . Multiple Vitamin (MULTIVITAMIN WITH MINERALS) TABS tablet Take 1 tablet by mouth daily.          Past Medical History:  Diagnosis Date  . Anxiety      takes Valium as needed  . Arthritis    . Bladder cancer ([place])      takes Rapaflo daily  . Blood dyscrasia      07/08/16: pt told he was a "free bleeder" after bleeding a lot when derm cut off mole on forehead. No excessive bleeding with minor wounds at home, no bleeding problems perioperatively with prior surgeries.  . Chronic back pain      spondylolisthesis  . Cluster headaches    . Depression      takes Lexapro daily  . Essential hypertension, benign      takes Diovan-HCT daily  . Joint pain    . Obstructive sleep apnea on CPAP      uses CPAP @ night  . Pneumonia      "several  times"--last time about 3-4 yrs ago  . PONV (postoperative nausea and vomiting)      Hx: of "sometimes"  . Prediabetes        Past Surgical History:  Procedure Laterality Date  . ANTERIOR CERVICAL DECOMP/DISCECTOMY FUSION N/A 06/05/2013   Procedure:  C3-4 Anterior Cervical Discectomy and Fusion, Allograft, Plate;  Surgeon: [name], MD;  Location: [facility];  Service: Orthopedics;  Laterality: N/A;  C3-4 Anterior Cervical Discectomy and Fusion, Allograft, Plate  . Arthroscopic knee surgery    . BACK SURGERY      x 2  . BLADDER SURGERY     x 5 to remove tumor  . CARPAL TUNNEL RELEASE    . CATARACT EXTRACTION W/PHACO  12/19/2012   Procedure: CATARACT EXTRACTION PHACO AND INTRAOCULAR LENS PLACEMENT (IOC);  Surgeon: [name], MD;  Location[facility];  Service: Ophthalmology;  Laterality: Left;  CDE: 26.80  . CATARACT EXTRACTION W/PHACO  01/09/2013   Procedure: CATARACT EXTRACTION PHACO AND INTRAOCULAR LENS PLACEMENT (IOC);  Surgeon: [name], MD;  Location: [facility];  Service: Ophthalmology;  Laterality: Right;  CDE:22.83  . CERVICAL FUSION  06/05/2013  C 3  C4  . CYSTOSCOPY    . REFRACTIVE SURGERY     Hx: of  . RETINAL DETACHMENT SURGERY    . TRANSURETHRAL RESECTION OF BLADDER TUMOR Right 07/27/2015   Procedure: TRANSURETHRAL RESECTION OF BLADDER TUMOR (TURBT);  Surgeon: [name], MD;  Location: [facility];  Service: Urology;  Laterality: Right;  . wisdom tooth extraction     Social History   Tobacco Use  . Smoking status: Former Smoker    Last attempt to quit: 12/30/1976    Years since quitting: 41.1  . Smokeless tobacco: Never Used  . Tobacco comment: 1985  Substance Use Topics  . Alcohol use: No    Comment: quit 1990  . Drug use: No   Family History  Problem Relation Age of Onset  . Hypertension Father   . Cancer Father        Prostate  . Depression Mother   . Hypertension Mother   . Alcohol abuse Mother   . COPD Mother        passed 07-08-17  . Hypertension Sister    . Anxiety disorder Sister   . Alcohol abuse Maternal Grandfather   . ADD / ADHD Neg Hx   . Bipolar disorder Neg Hx   . Dementia Neg Hx   . Drug abuse Neg Hx   . OCD Neg Hx   . Paranoid behavior Neg Hx   . Schizophrenia Neg Hx   . Seizures Neg Hx   . Sexual abuse Neg Hx   . Physical abuse Neg Hx            Allergies  Allergen Reactions  . Sulfonamide Derivatives Itching, Rash and Other (See Comments)      whelps      BP (!) 154/84   Pulse 78   Ht 6' (1.829 m)   Wt 237 lb (107.5 kg)   BMI 32.14 kg/m       Physical Exam   Ortho Exam General overall appearance is normal he is oriented x3 mood is normal his gait is shuffling with flat back with flexion knee contractures   His left knee is tender over the medial compartment there is no effusion.  He has a flexion contracture 10 degrees his knee flexion is 120 degrees his knee is stable in all planes muscle tone and strength are normal he has a slight upper extremity tremor his skin is warm dry and intact his foot is cool to touch she has poor pulse on the dorsalis pedis and posterior tib sensation in the foot is normal there is no lymphadenopathy in the groin on the left side       Medical decision-making   Imaging: X-ray shows severe arthritis medial compartment left knee       Encounter Diagnoses  Name Primary?  . Chronic pain of left knee    . Chronic pain of left ankle    . Primary osteoarthritis of left knee Yes  . S/P lumbar fusion    . Cold left foot        We will get a preop clearance from his medical doctor Dr. [name] and then we will also get an ABI to check his vascularity and then if all is normal we can set him up for left total knee.   Specific to his situation he is a high risk surgery patient for complications primarily related to stiffness of his knee.   I discussed this with him and his wife  The procedure has been fully reviewed with the patient; The risks and benefits of surgery have  been discussed and explained and understood. Alternative treatment has also been reviewed, questions were encouraged and answered. The postoperative plan is also been reviewed.     Arther Abbott, MD  Addendum Medical clearance has been obtained  ABI exam showed no significant peripheral vascular disease

## 2018-02-01 ENCOUNTER — Inpatient Hospital Stay (HOSPITAL_COMMUNITY)
Admission: RE | Admit: 2018-02-01 | Discharge: 2018-02-04 | DRG: 470 | Disposition: A | Discharge status: Skilled Nursing Facility | Payer: PPO | Source: Ambulatory Visit | Attending: Orthopedic Surgery | Admitting: Orthopedic Surgery

## 2018-02-01 ENCOUNTER — Encounter (HOSPITAL_COMMUNITY)
Admission: RE | Disposition: A | Discharge status: Skilled Nursing Facility | Payer: Self-pay | Source: Ambulatory Visit | Attending: Orthopedic Surgery

## 2018-02-01 ENCOUNTER — Inpatient Hospital Stay (HOSPITAL_COMMUNITY): Payer: PPO | Admitting: Anesthesiology

## 2018-02-01 ENCOUNTER — Inpatient Hospital Stay (HOSPITAL_COMMUNITY): Payer: PPO

## 2018-02-01 ENCOUNTER — Encounter (HOSPITAL_COMMUNITY): Payer: Self-pay | Admitting: *Deleted

## 2018-02-01 DIAGNOSIS — M1712 Unilateral primary osteoarthritis, left knee: Principal | ICD-10-CM | POA: Diagnosis present

## 2018-02-01 DIAGNOSIS — Z825 Family history of asthma and other chronic lower respiratory diseases: Secondary | ICD-10-CM

## 2018-02-01 DIAGNOSIS — G20 Parkinson's disease: Secondary | ICD-10-CM | POA: Diagnosis not present

## 2018-02-01 DIAGNOSIS — R7303 Prediabetes: Secondary | ICD-10-CM | POA: Diagnosis present

## 2018-02-01 DIAGNOSIS — Z882 Allergy status to sulfonamides status: Secondary | ICD-10-CM | POA: Diagnosis not present

## 2018-02-01 DIAGNOSIS — Z6832 Body mass index (BMI) 32.0-32.9, adult: Secondary | ICD-10-CM

## 2018-02-01 DIAGNOSIS — F329 Major depressive disorder, single episode, unspecified: Secondary | ICD-10-CM | POA: Diagnosis present

## 2018-02-01 DIAGNOSIS — F419 Anxiety disorder, unspecified: Secondary | ICD-10-CM | POA: Diagnosis not present

## 2018-02-01 DIAGNOSIS — Z811 Family history of alcohol abuse and dependence: Secondary | ICD-10-CM | POA: Diagnosis not present

## 2018-02-01 DIAGNOSIS — M549 Dorsalgia, unspecified: Secondary | ICD-10-CM | POA: Diagnosis not present

## 2018-02-01 DIAGNOSIS — M6281 Muscle weakness (generalized): Secondary | ICD-10-CM | POA: Diagnosis not present

## 2018-02-01 DIAGNOSIS — G8929 Other chronic pain: Secondary | ICD-10-CM | POA: Diagnosis not present

## 2018-02-01 DIAGNOSIS — Z8551 Personal history of malignant neoplasm of bladder: Secondary | ICD-10-CM

## 2018-02-01 DIAGNOSIS — I10 Essential (primary) hypertension: Secondary | ICD-10-CM | POA: Diagnosis not present

## 2018-02-01 DIAGNOSIS — Z961 Presence of intraocular lens: Secondary | ICD-10-CM | POA: Diagnosis not present

## 2018-02-01 DIAGNOSIS — Z79899 Other long term (current) drug therapy: Secondary | ICD-10-CM

## 2018-02-01 DIAGNOSIS — F411 Generalized anxiety disorder: Secondary | ICD-10-CM | POA: Diagnosis not present

## 2018-02-01 DIAGNOSIS — Z8701 Personal history of pneumonia (recurrent): Secondary | ICD-10-CM | POA: Diagnosis not present

## 2018-02-01 DIAGNOSIS — M25572 Pain in left ankle and joints of left foot: Secondary | ICD-10-CM | POA: Diagnosis present

## 2018-02-01 DIAGNOSIS — Z8249 Family history of ischemic heart disease and other diseases of the circulatory system: Secondary | ICD-10-CM

## 2018-02-01 DIAGNOSIS — Z818 Family history of other mental and behavioral disorders: Secondary | ICD-10-CM

## 2018-02-01 DIAGNOSIS — Z87891 Personal history of nicotine dependence: Secondary | ICD-10-CM | POA: Diagnosis not present

## 2018-02-01 DIAGNOSIS — M24562 Contracture, left knee: Secondary | ICD-10-CM | POA: Diagnosis not present

## 2018-02-01 DIAGNOSIS — G4733 Obstructive sleep apnea (adult) (pediatric): Secondary | ICD-10-CM | POA: Diagnosis not present

## 2018-02-01 DIAGNOSIS — Z96652 Presence of left artificial knee joint: Secondary | ICD-10-CM | POA: Diagnosis not present

## 2018-02-01 DIAGNOSIS — Z471 Aftercare following joint replacement surgery: Secondary | ICD-10-CM | POA: Diagnosis not present

## 2018-02-01 DIAGNOSIS — Z981 Arthrodesis status: Secondary | ICD-10-CM

## 2018-02-01 DIAGNOSIS — K5900 Constipation, unspecified: Secondary | ICD-10-CM | POA: Diagnosis not present

## 2018-02-01 DIAGNOSIS — Z8042 Family history of malignant neoplasm of prostate: Secondary | ICD-10-CM

## 2018-02-01 DIAGNOSIS — E669 Obesity, unspecified: Secondary | ICD-10-CM | POA: Diagnosis present

## 2018-02-01 DIAGNOSIS — Z9841 Cataract extraction status, right eye: Secondary | ICD-10-CM

## 2018-02-01 DIAGNOSIS — Z9842 Cataract extraction status, left eye: Secondary | ICD-10-CM | POA: Diagnosis not present

## 2018-02-01 DIAGNOSIS — R262 Difficulty in walking, not elsewhere classified: Secondary | ICD-10-CM | POA: Diagnosis not present

## 2018-02-01 HISTORY — PX: TOTAL KNEE ARTHROPLASTY: SHX125

## 2018-02-01 LAB — GLUCOSE, CAPILLARY: Glucose-Capillary: 107 mg/dL — ABNORMAL HIGH (ref 65–99)

## 2018-02-01 SURGERY — ARTHROPLASTY, KNEE, TOTAL
Anesthesia: Spinal | Site: Knee | Laterality: Left

## 2018-02-01 MED ORDER — MIDAZOLAM HCL 2 MG/2ML IJ SOLN
INTRAMUSCULAR | Status: AC
  Filled 2018-02-01: qty 2

## 2018-02-01 MED ORDER — BUPIVACAINE-EPINEPHRINE (PF) 0.5% -1:200000 IJ SOLN
INTRAMUSCULAR | Status: DC | PRN
  Administered 2018-02-01: 20 mL via PERINEURAL

## 2018-02-01 MED ORDER — HYDROCODONE-ACETAMINOPHEN 7.5-325 MG PO TABS
1.0000 | ORAL_TABLET | ORAL | Status: DC | PRN
  Administered 2018-02-01 – 2018-02-03 (×4): 1 via ORAL
  Filled 2018-02-01 (×4): qty 1

## 2018-02-01 MED ORDER — OXYCODONE HCL 5 MG PO TABS
5.0000 mg | ORAL_TABLET | Freq: Once | ORAL | Status: AC
  Administered 2018-02-01: 5 mg via ORAL

## 2018-02-01 MED ORDER — CARBIDOPA-LEVODOPA 25-100 MG PO TABS
1.0000 | ORAL_TABLET | Freq: Three times a day (TID) | ORAL | Status: DC
  Administered 2018-02-01 – 2018-02-04 (×9): 1 via ORAL
  Filled 2018-02-01 (×14): qty 1

## 2018-02-01 MED ORDER — MAGNESIUM CITRATE PO SOLN: 1.0000 | Freq: Once | ORAL | Status: DC | PRN

## 2018-02-01 MED ORDER — MIDAZOLAM HCL 2 MG/2ML IJ SOLN
1.0000 mg | INTRAMUSCULAR | Status: DC
  Administered 2018-02-01: 2 mg via INTRAVENOUS

## 2018-02-01 MED ORDER — DIAZEPAM 5 MG PO TABS: 5.0000 mg | ORAL_TABLET | Freq: Two times a day (BID) | ORAL | Status: DC | PRN

## 2018-02-01 MED ORDER — CEFAZOLIN SODIUM-DEXTROSE 2-4 GM/100ML-% IV SOLN
2.0000 g | Freq: Four times a day (QID) | INTRAVENOUS | Status: AC
  Administered 2018-02-01 – 2018-02-02 (×2): 2 g via INTRAVENOUS
  Filled 2018-02-01 (×3): qty 100

## 2018-02-01 MED ORDER — SODIUM CHLORIDE 0.9 % IR SOLN
Status: DC | PRN
  Administered 2018-02-01: 3000 mL

## 2018-02-01 MED ORDER — MIDAZOLAM HCL 5 MG/5ML IJ SOLN
INTRAMUSCULAR | Status: DC | PRN
  Administered 2018-02-01: 2 mg via INTRAVENOUS
  Administered 2018-02-01 (×2): 1 mg via INTRAVENOUS

## 2018-02-01 MED ORDER — SODIUM CHLORIDE 0.9 % IV SOLN
INTRAVENOUS | Status: DC
  Administered 2018-02-01: 20:00:00 via INTRAVENOUS

## 2018-02-01 MED ORDER — ONDANSETRON HCL 4 MG/2ML IJ SOLN
4.0000 mg | Freq: Once | INTRAMUSCULAR | Status: AC
  Administered 2018-02-01: 4 mg via INTRAVENOUS

## 2018-02-01 MED ORDER — HYDROCODONE-ACETAMINOPHEN 10-325 MG PO TABS
2.0000 | ORAL_TABLET | ORAL | Status: DC | PRN
  Administered 2018-02-01: 2 via ORAL

## 2018-02-01 MED ORDER — FENTANYL CITRATE (PF) 100 MCG/2ML IJ SOLN
INTRAMUSCULAR | Status: AC
  Filled 2018-02-01: qty 2

## 2018-02-01 MED ORDER — MENTHOL 3 MG MT LOZG
1.0000 | LOZENGE | OROMUCOSAL | Status: DC | PRN
  Filled 2018-02-01: qty 9

## 2018-02-01 MED ORDER — HYDROMORPHONE HCL 1 MG/ML IJ SOLN
0.5000 mg | INTRAMUSCULAR | Status: DC | PRN
  Administered 2018-02-01 – 2018-02-03 (×4): 0.5 mg via INTRAVENOUS
  Filled 2018-02-01: qty 1
  Filled 2018-02-01: qty 0.5
  Filled 2018-02-01: qty 1

## 2018-02-01 MED ORDER — SODIUM CHLORIDE 0.9 % IJ SOLN
INTRAMUSCULAR | Status: AC
  Filled 2018-02-01: qty 40

## 2018-02-01 MED ORDER — KETOROLAC TROMETHAMINE 15 MG/ML IJ SOLN
15.0000 mg | Freq: Four times a day (QID) | INTRAMUSCULAR | Status: AC
  Administered 2018-02-01 – 2018-02-02 (×4): 15 mg via INTRAVENOUS
  Filled 2018-02-01 (×7): qty 1

## 2018-02-01 MED ORDER — HYDROCHLOROTHIAZIDE 12.5 MG PO CAPS
12.5000 mg | ORAL_CAPSULE | Freq: Every day | ORAL | Status: DC
  Administered 2018-02-02 – 2018-02-04 (×3): 12.5 mg via ORAL
  Filled 2018-02-01 (×4): qty 1

## 2018-02-01 MED ORDER — CEFAZOLIN SODIUM-DEXTROSE 2-4 GM/100ML-% IV SOLN
INTRAVENOUS | Status: AC
  Filled 2018-02-01: qty 100

## 2018-02-01 MED ORDER — ACETAMINOPHEN 650 MG RE SUPP
650.0000 mg | RECTAL | Status: DC | PRN
Start: 2018-02-01 — End: 2018-02-04
  Filled 2018-02-01: qty 1

## 2018-02-01 MED ORDER — SORBITOL 70 % SOLN: 30.0000 mL | Freq: Every day | Status: DC | PRN

## 2018-02-01 MED ORDER — HYDROCODONE-ACETAMINOPHEN 10-325 MG PO TABS
ORAL_TABLET | ORAL | Status: AC
  Filled 2018-02-01: qty 2

## 2018-02-01 MED ORDER — DULOXETINE HCL 60 MG PO CPEP
60.0000 mg | ORAL_CAPSULE | Freq: Every day | ORAL | Status: DC
  Administered 2018-02-02 – 2018-02-04 (×3): 60 mg via ORAL
  Filled 2018-02-01 (×4): qty 1

## 2018-02-01 MED ORDER — LIDOCAINE HCL (PF) 1 % IJ SOLN
INTRAMUSCULAR | Status: AC
  Filled 2018-02-01: qty 5

## 2018-02-01 MED ORDER — BUPIVACAINE LIPOSOME 1.3 % IJ SUSP
INTRAMUSCULAR | Status: AC
  Filled 2018-02-01: qty 20

## 2018-02-01 MED ORDER — FENTANYL CITRATE (PF) 100 MCG/2ML IJ SOLN
INTRAMUSCULAR | Status: DC | PRN
  Administered 2018-02-01: 25 ug via INTRATHECAL
  Administered 2018-02-01: 25 ug via INTRAVENOUS

## 2018-02-01 MED ORDER — ACETAMINOPHEN 500 MG PO TABS
1000.0000 mg | ORAL_TABLET | Freq: Once | ORAL | Status: AC
  Administered 2018-02-01: 1000 mg via ORAL

## 2018-02-01 MED ORDER — ONDANSETRON HCL 4 MG PO TABS: 4.0000 mg | ORAL_TABLET | Freq: Four times a day (QID) | ORAL | Status: DC | PRN

## 2018-02-01 MED ORDER — LACTATED RINGERS IV SOLN
INTRAVENOUS | Status: DC
  Administered 2018-02-01 (×2): via INTRAVENOUS

## 2018-02-01 MED ORDER — FENTANYL CITRATE (PF) 100 MCG/2ML IJ SOLN
25.0000 ug | Freq: Once | INTRAMUSCULAR | Status: AC
  Administered 2018-02-01: 25 ug via INTRAVENOUS

## 2018-02-01 MED ORDER — ONDANSETRON HCL 4 MG/2ML IJ SOLN: 4.0000 mg | Freq: Four times a day (QID) | INTRAMUSCULAR | Status: DC | PRN

## 2018-02-01 MED ORDER — TRANEXAMIC ACID 1000 MG/10ML IV SOLN
INTRAVENOUS | Status: AC
  Filled 2018-02-01: qty 10

## 2018-02-01 MED ORDER — SUCCINYLCHOLINE CHLORIDE 20 MG/ML IJ SOLN
INTRAMUSCULAR | Status: AC
  Filled 2018-02-01: qty 1

## 2018-02-01 MED ORDER — METOCLOPRAMIDE HCL 5 MG/ML IJ SOLN: 5.0000 mg | Freq: Three times a day (TID) | INTRAMUSCULAR | Status: DC | PRN

## 2018-02-01 MED ORDER — BUPIVACAINE-EPINEPHRINE (PF) 0.5% -1:200000 IJ SOLN
INTRAMUSCULAR | Status: AC
  Filled 2018-02-01: qty 30

## 2018-02-01 MED ORDER — VALSARTAN-HYDROCHLOROTHIAZIDE 320-12.5 MG PO TABS: 1.0000 | ORAL_TABLET | Freq: Every day | ORAL | Status: DC

## 2018-02-01 MED ORDER — DEXAMETHASONE SODIUM PHOSPHATE 10 MG/ML IJ SOLN
10.0000 mg | Freq: Once | INTRAMUSCULAR | Status: AC
  Administered 2018-02-02: 10 mg via INTRAVENOUS
  Filled 2018-02-01: qty 1

## 2018-02-01 MED ORDER — POLYVINYL ALCOHOL 1.4 % OP SOLN: 1.0000 [drp] | Freq: Three times a day (TID) | OPHTHALMIC | Status: DC | PRN

## 2018-02-01 MED ORDER — PROPOFOL 10 MG/ML IV BOLUS
INTRAVENOUS | Status: AC
  Filled 2018-02-01: qty 40

## 2018-02-01 MED ORDER — DIPHENHYDRAMINE HCL 12.5 MG/5ML PO ELIX
12.5000 mg | ORAL_SOLUTION | ORAL | Status: DC | PRN
  Administered 2018-02-01: 12.5 mg via ORAL
  Filled 2018-02-01: qty 5

## 2018-02-01 MED ORDER — OXYCODONE HCL 5 MG PO TABS
ORAL_TABLET | ORAL | Status: AC
Start: 2018-02-01 — End: 2018-02-01
  Filled 2018-02-01: qty 1

## 2018-02-01 MED ORDER — IRBESARTAN 300 MG PO TABS
300.0000 mg | ORAL_TABLET | Freq: Every day | ORAL | Status: DC
  Administered 2018-02-02 – 2018-02-04 (×3): 300 mg via ORAL
  Filled 2018-02-01 (×3): qty 1

## 2018-02-01 MED ORDER — DOCUSATE SODIUM 100 MG PO CAPS
100.0000 mg | ORAL_CAPSULE | Freq: Two times a day (BID) | ORAL | Status: DC
  Administered 2018-02-01 – 2018-02-04 (×6): 100 mg via ORAL
  Filled 2018-02-01 (×6): qty 1

## 2018-02-01 MED ORDER — PHENOL 1.4 % MT LIQD: 1.0000 | OROMUCOSAL | Status: DC | PRN

## 2018-02-01 MED ORDER — CEFAZOLIN SODIUM-DEXTROSE 2-4 GM/100ML-% IV SOLN
2.0000 g | INTRAVENOUS | Status: AC
  Administered 2018-02-01: 2 g via INTRAVENOUS

## 2018-02-01 MED ORDER — TRANEXAMIC ACID 1000 MG/10ML IV SOLN
1000.0000 mg | Freq: Once | INTRAVENOUS | Status: AC
  Administered 2018-02-01: 1000 mg via INTRAVENOUS
  Filled 2018-02-01: qty 10

## 2018-02-01 MED ORDER — ACETAMINOPHEN 500 MG PO TABS
ORAL_TABLET | ORAL | Status: AC
  Filled 2018-02-01: qty 2

## 2018-02-01 MED ORDER — ONDANSETRON HCL 4 MG/2ML IJ SOLN
INTRAMUSCULAR | Status: AC
  Filled 2018-02-01: qty 2

## 2018-02-01 MED ORDER — EPHEDRINE SULFATE 50 MG/ML IJ SOLN
INTRAMUSCULAR | Status: AC
Start: 2018-02-01 — End: 2018-02-01
  Filled 2018-02-01: qty 1

## 2018-02-01 MED ORDER — SODIUM CHLORIDE 0.9 % IJ SOLN
INTRAMUSCULAR | Status: AC
  Filled 2018-02-01: qty 10

## 2018-02-01 MED ORDER — BUPIVACAINE IN DEXTROSE 0.75-8.25 % IT SOLN
INTRATHECAL | Status: AC
  Filled 2018-02-01: qty 2

## 2018-02-01 MED ORDER — SODIUM CHLORIDE 0.9 % IV SOLN
INTRAVENOUS | Status: DC | PRN
  Administered 2018-02-01: 60 mL

## 2018-02-01 MED ORDER — ACETAMINOPHEN 325 MG PO TABS
650.0000 mg | ORAL_TABLET | ORAL | Status: DC | PRN
  Administered 2018-02-01: 650 mg via ORAL
  Filled 2018-02-01: qty 2

## 2018-02-01 MED ORDER — BUPIVACAINE IN DEXTROSE 0.75-8.25 % IT SOLN
INTRATHECAL | Status: DC | PRN
  Administered 2018-02-01: 15 mg via INTRATHECAL

## 2018-02-01 MED ORDER — HYDROMORPHONE HCL 1 MG/ML IJ SOLN
0.2500 mg | INTRAMUSCULAR | Status: DC | PRN
  Administered 2018-02-01 (×4): 0.5 mg via INTRAVENOUS
  Filled 2018-02-01 (×5): qty 0.5

## 2018-02-01 MED ORDER — MAGNESIUM HYDROXIDE 400 MG/5ML PO SUSP
30.0000 mL | Freq: Every day | ORAL | Status: DC | PRN
  Administered 2018-02-03: 30 mL via ORAL
  Filled 2018-02-01: qty 30

## 2018-02-01 MED ORDER — PHENYLEPHRINE 40 MCG/ML (10ML) SYRINGE FOR IV PUSH (FOR BLOOD PRESSURE SUPPORT)
PREFILLED_SYRINGE | INTRAVENOUS | Status: AC
  Filled 2018-02-01: qty 10

## 2018-02-01 MED ORDER — FLUTICASONE PROPIONATE 50 MCG/ACT NA SUSP: 1.0000 | Freq: Every day | NASAL | Status: DC | PRN

## 2018-02-01 MED ORDER — GABAPENTIN 100 MG PO CAPS
100.0000 mg | ORAL_CAPSULE | Freq: Once | ORAL | Status: AC
  Administered 2018-02-01: 100 mg via ORAL
  Filled 2018-02-01: qty 1

## 2018-02-01 MED ORDER — METHOCARBAMOL 500 MG PO TABS
500.0000 mg | ORAL_TABLET | Freq: Four times a day (QID) | ORAL | Status: DC | PRN
  Administered 2018-02-03 – 2018-02-04 (×2): 500 mg via ORAL
  Filled 2018-02-01 (×2): qty 1

## 2018-02-01 MED ORDER — PROPOFOL 500 MG/50ML IV EMUL
INTRAVENOUS | Status: DC | PRN
  Administered 2018-02-01: 50 ug/kg/min via INTRAVENOUS
  Administered 2018-02-01: 35 ug/kg/min via INTRAVENOUS
  Administered 2018-02-01: 09:00:00 via INTRAVENOUS

## 2018-02-01 MED ORDER — ASPIRIN 81 MG PO CHEW
81.0000 mg | CHEWABLE_TABLET | Freq: Two times a day (BID) | ORAL | Status: DC
  Administered 2018-02-01 – 2018-02-04 (×6): 81 mg via ORAL
  Filled 2018-02-01 (×6): qty 1

## 2018-02-01 MED ORDER — 0.9 % SODIUM CHLORIDE (POUR BTL) OPTIME
TOPICAL | Status: DC | PRN
  Administered 2018-02-01: 1000 mL

## 2018-02-01 MED ORDER — ALUM & MAG HYDROXIDE-SIMETH 200-200-20 MG/5ML PO SUSP: 30.0000 mL | ORAL | Status: DC | PRN

## 2018-02-01 MED ORDER — METHOCARBAMOL 1000 MG/10ML IJ SOLN
500.0000 mg | Freq: Once | INTRAVENOUS | Status: AC
  Administered 2018-02-01: 500 mg via INTRAVENOUS
  Filled 2018-02-01: qty 550

## 2018-02-01 MED ORDER — METOCLOPRAMIDE HCL 10 MG PO TABS: 5.0000 mg | ORAL_TABLET | Freq: Three times a day (TID) | ORAL | Status: DC | PRN

## 2018-02-01 MED ORDER — GABAPENTIN 300 MG PO CAPS
ORAL_CAPSULE | ORAL | Status: AC
  Filled 2018-02-01: qty 1

## 2018-02-01 MED ORDER — OXYCODONE-ACETAMINOPHEN 5-325 MG PO TABS
1.0000 | ORAL_TABLET | ORAL | Status: DC | PRN
Start: 2018-02-01 — End: 2018-02-04
  Administered 2018-02-01 – 2018-02-04 (×8): 1 via ORAL
  Filled 2018-02-01 (×8): qty 1

## 2018-02-01 MED ORDER — METHOCARBAMOL 1000 MG/10ML IJ SOLN
500.0000 mg | Freq: Four times a day (QID) | INTRAVENOUS | Status: DC | PRN
  Filled 2018-02-01: qty 5

## 2018-02-01 SURGICAL SUPPLY — 72 items
BAG HAMPER (MISCELLANEOUS) ×3 IMPLANT
BANDAGE ELASTIC 4 LF NS (GAUZE/BANDAGES/DRESSINGS) ×6 IMPLANT
BANDAGE ELASTIC 6 LF NS (GAUZE/BANDAGES/DRESSINGS) ×3 IMPLANT
BANDAGE ESMARK 6X9 LF (GAUZE/BANDAGES/DRESSINGS) ×1 IMPLANT
BIT DRILL 3.2X128 (BIT) ×2 IMPLANT
BIT DRILL 3.2X128MM (BIT) ×1
BLADE HEX COATED 2.75 (ELECTRODE) ×3 IMPLANT
BLADE SAGITTAL 25.0X1.27X90 (BLADE) ×2 IMPLANT
BLADE SAGITTAL 25.0X1.27X90MM (BLADE) ×1
BNDG ESMARK 6X9 LF (GAUZE/BANDAGES/DRESSINGS) ×3
CAP KNEE TOTAL 3 SIGMA ×3 IMPLANT
CEMENT HV SMART SET (Cement) ×6 IMPLANT
CLOTH BEACON ORANGE TIMEOUT ST (SAFETY) ×3 IMPLANT
COOLER CRYO CUFF IC AND MOTOR (MISCELLANEOUS) ×3 IMPLANT
COVER LIGHT HANDLE STERIS (MISCELLANEOUS) ×6 IMPLANT
CUFF CRYO KNEE18X23 MED (MISCELLANEOUS) ×3 IMPLANT
CUFF TOURNIQUET SINGLE 34IN LL (TOURNIQUET CUFF) ×3 IMPLANT
DECANTER SPIKE VIAL GLASS SM (MISCELLANEOUS) ×3 IMPLANT
DRAPE BACK TABLE (DRAPES) ×3 IMPLANT
DRAPE EXTREMITY T 121X128X90 (DRAPE) ×3 IMPLANT
DRESSING AQUACEL AG ADV 3.5X12 (MISCELLANEOUS) ×1 IMPLANT
DRSG AQUACEL AG ADV 3.5X12 (MISCELLANEOUS) ×3
DRSG MEPILEX BORDER 4X12 (GAUZE/BANDAGES/DRESSINGS) ×3 IMPLANT
DURAPREP 26ML APPLICATOR (WOUND CARE) ×6 IMPLANT
ELECT REM PT RETURN 9FT ADLT (ELECTROSURGICAL) ×3
ELECTRODE REM PT RTRN 9FT ADLT (ELECTROSURGICAL) ×1 IMPLANT
EVACUATOR 3/16  PVC DRAIN (DRAIN) ×2
EVACUATOR 3/16 PVC DRAIN (DRAIN) ×1 IMPLANT
GLOVE BIO SURGEON STRL SZ7 (GLOVE) ×9 IMPLANT
GLOVE BIOGEL PI IND STRL 6.5 (GLOVE) ×2 IMPLANT
GLOVE BIOGEL PI IND STRL 7.0 (GLOVE) ×3 IMPLANT
GLOVE BIOGEL PI INDICATOR 6.5 (GLOVE) ×4
GLOVE BIOGEL PI INDICATOR 7.0 (GLOVE) ×6
GLOVE SKINSENSE NS SZ8.0 LF (GLOVE) ×4
GLOVE SKINSENSE STRL SZ8.0 LF (GLOVE) ×2 IMPLANT
GLOVE SS N UNI LF 8.5 STRL (GLOVE) ×3 IMPLANT
GOWN STRL REUS W/ TWL LRG LVL3 (GOWN DISPOSABLE) ×1 IMPLANT
GOWN STRL REUS W/TWL LRG LVL3 (GOWN DISPOSABLE) ×8 IMPLANT
GOWN STRL REUS W/TWL XL LVL3 (GOWN DISPOSABLE) ×3 IMPLANT
HANDPIECE INTERPULSE COAX TIP (DISPOSABLE) ×2
HOOD W/PEELAWAY (MISCELLANEOUS) ×12 IMPLANT
INST SET MAJOR BONE (KITS) ×3 IMPLANT
IV NS IRRIG 3000ML ARTHROMATIC (IV SOLUTION) ×3 IMPLANT
KIT BLADEGUARD II DBL (SET/KITS/TRAYS/PACK) ×3 IMPLANT
KIT ROOM TURNOVER APOR (KITS) ×3 IMPLANT
MANIFOLD NEPTUNE II (INSTRUMENTS) ×3 IMPLANT
MARKER SKIN DUAL TIP RULER LAB (MISCELLANEOUS) ×3 IMPLANT
NEEDLE HYPO 21X1.5 SAFETY (NEEDLE) ×3 IMPLANT
NS IRRIG 1000ML POUR BTL (IV SOLUTION) ×3 IMPLANT
PACK TOTAL JOINT (CUSTOM PROCEDURE TRAY) ×3 IMPLANT
PAD ARMBOARD 7.5X6 YLW CONV (MISCELLANEOUS) ×3 IMPLANT
PAD DANNIFLEX CPM (ORTHOPEDIC SUPPLIES) ×3 IMPLANT
PILLOW KNEE EXTENSION 0 DEG (MISCELLANEOUS) ×3 IMPLANT
PIN TROCAR 3 INCH (PIN) IMPLANT
SAW OSC TIP CART 19.5X105X1.3 (SAW) ×3 IMPLANT
SET BASIN LINEN APH (SET/KITS/TRAYS/PACK) ×3 IMPLANT
SET HNDPC FAN SPRY TIP SCT (DISPOSABLE) ×1 IMPLANT
STAPLER VISISTAT 35W (STAPLE) ×3 IMPLANT
SUT BRALON NAB BRD #1 30IN (SUTURE) ×6 IMPLANT
SUT MNCRL 0 VIOLET CTX 36 (SUTURE) ×1 IMPLANT
SUT MON AB 0 CT1 (SUTURE) ×3 IMPLANT
SUT MON AB 2-0 CT1 36 (SUTURE) IMPLANT
SUT MONOCRYL 0 CTX 36 (SUTURE) ×2
SYR 20CC LL (SYRINGE) ×18 IMPLANT
SYR 30ML LL (SYRINGE) ×3 IMPLANT
SYR BULB IRRIGATION 50ML (SYRINGE) ×3 IMPLANT
TOWEL OR 17X26 4PK STRL BLUE (TOWEL DISPOSABLE) ×3 IMPLANT
TOWER CARTRIDGE SMART MIX (DISPOSABLE) ×3 IMPLANT
TRAY FOLEY W/METER SILVER 16FR (SET/KITS/TRAYS/PACK) ×3 IMPLANT
WATER STERILE IRR 1000ML POUR (IV SOLUTION) ×6 IMPLANT
YANKAUER SUCT 12FT TUBE ARGYLE (SUCTIONS) ×3 IMPLANT
YANKAUER SUCT BULB TIP NO VENT (SUCTIONS) ×3 IMPLANT

## 2018-02-01 NOTE — Anesthesia Preprocedure Evaluation (Signed)
Anesthesia Evaluation  Patient identified by MRN, date of birth, ID band Patient awake    Reviewed: Allergy & Precautions, H&P , NPO status , Patient's Chart, lab work & pertinent test results  History of Anesthesia Complications (+) PONV and history of anesthetic complications  Airway Mallampati: III  TM Distance: <3 FB Neck ROM: Limited    Dental  (+) Teeth Intact, Dental Advisory Given,    Pulmonary shortness of breath, sleep apnea and Continuous Positive Airway Pressure Ventilation , former smoker,    breath sounds clear to auscultation       Cardiovascular hypertension, Pt. on medications  Rhythm:Regular Rate:Normal     Neuro/Psych  Headaches, Anxiety Depression  Neuromuscular disease    GI/Hepatic negative GI ROS,   Endo/Other    Renal/GU Bladder ca hx     Musculoskeletal  (+) Arthritis ,   Abdominal (+) + obese,   Peds  Hematology   Anesthesia Other Findings   Reproductive/Obstetrics                             Anesthesia Physical Anesthesia Plan  ASA: III  Anesthesia Plan: Spinal   Post-op Pain Management:    Induction: Intravenous  PONV Risk Score and Plan:   Airway Management Planned: Simple Face Mask  Additional Equipment:   Intra-op Plan:   Post-operative Plan:   Informed Consent: I have reviewed the patients History and Physical, chart, labs and discussed the procedure including the risks, benefits and alternatives for the proposed anesthesia with the patient or authorized representative who has indicated his/her understanding and acceptance.     Plan Discussed with:   Anesthesia Plan Comments: (Possible got with glidescope)        Anesthesia Quick Evaluation

## 2018-02-01 NOTE — Transfer of Care (Signed)
Immediate Anesthesia Transfer of Care Note  Patient: Zachary [name]  Procedure(s) Performed: TOTAL KNEE ARTHROPLASTY (Left Knee)  Patient Location: PACU  Anesthesia Type:Spinal  Level of Consciousness: awake, alert , oriented and patient cooperative  Airway & Oxygen Therapy: Patient Spontanous Breathing  Post-op Assessment: Report given to RN and Post -op Vital signs reviewed and stable  Post vital signs: Reviewed and stable  Last Vitals: There were no vitals filed for this visit.  Last Pain:  Vitals:   02/01/18 0630  PainSc: 3       Patients Stated Pain Goal: 5 ([id number])  Complications: No apparent anesthesia complications

## 2018-02-01 NOTE — Anesthesia Procedure Notes (Signed)
Spinal  Patient location during procedure: OR Start time: 02/01/2018 7:40 AM Staffing Resident/CRNA: Mickel Baas, CRNA Preanesthetic Checklist Completed: patient identified, site marked, surgical consent, pre-op evaluation, timeout performed, IV checked, risks and benefits discussed and monitors and equipment checked Spinal Block Patient position: left lateral decubitus Prep: Betadine Patient monitoring: heart rate, cardiac monitor, continuous pulse ox and blood pressure Approach: left paramedian Location: L3-4 Injection technique: single-shot Needle Needle type: Spinocan  Needle gauge: 22 G Needle length: 9 cm Assessment Sensory level: T8 Additional Notes  ATTEMPTS:1 TRAY XQ:[id number] TRAY EXPIRATION DATE:06/27/2019 Patient tolerated well

## 2018-02-01 NOTE — Op Note (Signed)
02/01/2018  9:50 AM  PATIENT:  Zachary [name]  72 y.o. male  PRE-OPERATIVE DIAGNOSIS:  PRIMARY OSTEOARTHRITIS KNEE  POST-OPERATIVE DIAGNOSIS:  PRIMARY OSTEOARTHRITIS KNEE  PROCEDURE:  Procedure(s): TOTAL KNEE ARTHROPLASTY (Left)  10F; 5T, 10 POLY PS, 38 X 9 P  SURGEON:  Surgeon(s) and Role:    * Carole Civil, MD - Primary  PHYSICIAN ASSISTANT:   ASSISTANTS: CATHERINE PAGE BETTY ASHLEY    ANESTHESIA:   spinal  EBL:  25 mL   BLOOD ADMINISTERED:none  DRAINS: HEMOVAC  LOCAL MEDICATIONS USED:  MARCAINE   , Amount: 30 ml and OTHER EXPAREL 20 DILUTED WITH 40 CC NACL  SPECIMEN:  No Specimen  DISPOSITION OF SPECIMEN:  N/A  COUNTS:  YES  TOURNIQUET:   Total Tourniquet Time Documented: Thigh (Left) - 32 minutes Total: Thigh (Left) - 32 minutes   DICTATION: .Viviann Spare Dictation  PLAN OF CARE: Admit to inpatient   PATIENT DISPOSITION:  PACU - hemodynamically stable.   Delay start of Pharmacological VTE agent (>24hrs) due to surgical blood loss or risk of bleeding: yes   The patient was identified by 2 approved identification mechanism. The operative extremity was evaluated and found to be acceptable for surgical treatment today. The chart was reviewed. The surgical site was confirmed and marked.  The patient was taken to the operating room and given a follow antibiotic ANCEF. This is consistent with the SCIP medications.  The patient was given the following anesthetic: SPINAL   The patient was then placed supine on the operating table. A Foley catheter was inserted. The operative extremity LEFT KNEE AND LEG  was prepped and draped sterilely from the toes to the groin.  Timeout procedure was executed confirming the patient's name, surgical site, antibiotic administration, x-rays available, and implants available.  The operative limb LEFT LEG ,  was exsanguinated with a six-inch Esmarch and the tourniquet was inflated to 300 mmHg.  A straight midline incision was made  and taken down to the extensor mechanism. A medial arthrotomy was performed. The patella was everted and the patellofemoral ligament was released. The anterior cruciate ligament and PCL were resected.  The anterior horns of the lateral and medial meniscus were resected. The medial soft tissue sleeve was elevated to the mid coronal plane.  A three-eighths inch drill bit was used to enter the femoral canal which was decompressed with suction and irrigation until clear. The distal femoral cutting guide was set for 12 mm distal resection,  5valgus alignment, for a LEFT knee. The distal femur was resected and checked for flatness.  The Depuy Sigma sizing femoral guide was placed and the femur was sized to a size 5 . A 4-in-1 cutting block was placed along with collateral ligament retractors and the distal femoral cuts were completed.  The external alignment guide for the tibial resection was then applied to the distal and proximal tibia and set for anatomic slope along with 10 MM resection  from the  [place] .   Rotational alignment was set using the malleolus, the tibial tubercle and the tibial spines.  The proximal tibia was resected residual menisci were removed. The tibia was sized using a base plate to a size  5 .   Spacer blocks were used to confirm equal flexion extension gaps with releases done as needed. A size 10 MM spacer block gave equal stability and flexion extension AFTER EXTENSIVE MEDIAL AND POSTERO-MEDIAL RELEASE INCLUDING DISTAL FEMUR.  The notch cutting guide for the femur  was then applied and the notch cut was made.  Trial reduction was completed using 18F 5T 10PS  trial implants. Patella tracking was normal  We then skeletonized the patella. It measured 25 in thickness and the patellar resection was set for 14 MILLImeters. the patellar resection was completed. The patella diameter measured 14. We then drilled the peg holes for the patella  The proximal tibia was  prepared using the size 5 base plate.  Thorough irrigation was performed and the bone was dried and prepared for cement. The cement was mixed on the back table using third generation preparation techniques  60 cc of dilute Exparel was injected into the soft tissues including the posterior capsule.  The implants were then cemented in place and excess cement was removed. The cement was allowed to cure. Irrigation was repeated and excess and residual bone fragments and cement were removed.  A medium-size Hemovac was placed in the joint with 6 openings in the tube left in the joint.  The extensor mechanism was closed with #1 Bralon suture followed by subcutaneous tissue closure using 0 Monocryl suture  30 cc of Marcaine with epinephrine was injected into the joint  Skin approximation was performed using staples  A sterile dressing was applied, followed by a TED hose and then a Cryo/Cuff which was activated   The patient was taken recovery room in stable condition  [phone]

## 2018-02-01 NOTE — Brief Op Note (Signed)
02/01/2018  9:50 AM  PATIENT:  Zachary [name]  72 y.o. male  PRE-OPERATIVE DIAGNOSIS:  PRIMARY OSTEOARTHRITIS KNEE  POST-OPERATIVE DIAGNOSIS:  PRIMARY OSTEOARTHRITIS KNEE  PROCEDURE:  Procedure(s): TOTAL KNEE ARTHROPLASTY (Left)  72F; 5T, 10 POLY PS, 38 X 9 P  SURGEON:  Surgeon(s) and Role:    * Carole Civil, MD - Primary  PHYSICIAN ASSISTANT:   ASSISTANTS: CATHERINE PAGE BETTY ASHLEY    ANESTHESIA:   spinal  EBL:  25 mL   BLOOD ADMINISTERED:none  DRAINS: HEMOVAC  LOCAL MEDICATIONS USED:  MARCAINE   , Amount: 30 ml and OTHER EXPAREL 20 DILUTED WITH 40 CC NACL  SPECIMEN:  No Specimen  DISPOSITION OF SPECIMEN:  N/A  COUNTS:  YES  TOURNIQUET:   Total Tourniquet Time Documented: Thigh (Left) - 32 minutes Total: Thigh (Left) - 32 minutes   DICTATION: .Viviann Spare Dictation  PLAN OF CARE: Admit to inpatient   PATIENT DISPOSITION:  PACU - hemodynamically stable.   Delay start of Pharmacological VTE agent (>24hrs) due to surgical blood loss or risk of bleeding: yes

## 2018-02-01 NOTE — Anesthesia Postprocedure Evaluation (Signed)
Anesthesia Post Note  Patient: Zachary [name]  Procedure(s) Performed: TOTAL KNEE ARTHROPLASTY (Left Knee)  Patient location during evaluation: PACU Anesthesia Type: Spinal Level of consciousness: awake and alert, oriented and patient cooperative Pain management: pain level controlled Vital Signs Assessment: post-procedure vital signs reviewed and stable Respiratory status: spontaneous breathing Cardiovascular status: stable Postop Assessment: no apparent nausea or vomiting Anesthetic complications: no     Last Vitals: There were no vitals filed for this visit.  Last Pain:  Vitals:   02/01/18 0630  PainSc: 3                  Madix Blowe A

## 2018-02-01 NOTE — Interval H&P Note (Signed)
History and Physical Interval Note:  02/01/2018 7:12 AM   88  129/77   CBC Latest Ref Rng & Units 01/27/2018 08/05/2016 07/24/2016  WBC 4.0 - 10.5 K/uL 8.2 8.1 16.7(H)  Hemoglobin 13.0 - 17.0 g/dL 15.0 10.6(L) 11.4(L)  Hematocrit 39.0 - 52.0 % 46.8 32.1(L) 34.0(L)  Platelets 150 - 400 K/uL 213 228 229   BMP Latest Ref Rng & Units 01/27/2018 08/05/2016 07/24/2016  Glucose 65 - 99 mg/dL 99 96 88  BUN 6 - 20 mg/dL 17 14 33(H)  Creatinine 0.61 - 1.24 mg/dL 1.08 0.98 1.15  Sodium 135 - 145 mmol/L 141 139 134(L)  Potassium 3.5 - 5.1 mmol/L 3.4(L) 4.1 4.2  Chloride 101 - 111 mmol/L 104 103 102  CO2 22 - 32 mmol/L 26 27 25   Calcium 8.9 - 10.3 mg/dL 9.0 8.5(L) 8.3(L)     Zachary [name]  has presented today for surgery, with the diagnosis of PRIMARY OSTEOARTHRITIS KNEE  The various methods of treatment have been discussed with the patient and family. After consideration of risks, benefits and other options for treatment, the patient has consented to  Procedure(s): TOTAL KNEE ARTHROPLASTY (Left) as a surgical intervention .  The patient's history has been reviewed, patient examined, no change in status, stable for surgery.  I have reviewed the patient's chart and labs.  Questions were answered to the patient's satisfaction.     Arther Abbott

## 2018-02-01 NOTE — OR Nursing (Signed)
Patient eating regular diet and wife at bedside. No C/o at this time, "it's been a long day, I'm just ready to get to my room'.

## 2018-02-01 NOTE — Anesthesia Procedure Notes (Signed)
Procedure Name: MAC Date/Time: 02/01/2018 7:22 AM Performed by: Andree Elk Amy A, CRNA Pre-anesthesia Checklist: Patient identified, Timeout performed, Emergency Drugs available, Suction available and Patient being monitored Oxygen Delivery Method: Simple face mask

## 2018-02-01 NOTE — Progress Notes (Addendum)
Pt stated that the foot pumps were making his knee Birt and that he could not sleep with them on. Explain to pt the reason for them but still did not want them on.

## 2018-02-02 ENCOUNTER — Encounter (HOSPITAL_COMMUNITY): Payer: Self-pay | Admitting: Orthopedic Surgery

## 2018-02-02 ENCOUNTER — Other Ambulatory Visit: Payer: Self-pay

## 2018-02-02 LAB — BASIC METABOLIC PANEL
Anion gap: 12 (ref 5–15)
BUN: 12 mg/dL (ref 6–20)
CO2: 27 mmol/L (ref 22–32)
CREATININE: 1.21 mg/dL (ref 0.61–1.24)
Calcium: 8.1 mg/dL — ABNORMAL LOW (ref 8.9–10.3)
Chloride: 98 mmol/L — ABNORMAL LOW (ref 101–111)
GFR calc non Af Amer: 58 mL/min — ABNORMAL LOW (ref >60)
Glucose, Bld: 139 mg/dL — ABNORMAL HIGH (ref 65–99)
Potassium: 3.5 mmol/L (ref 3.5–5.1)
SODIUM: 137 mmol/L (ref 135–145)

## 2018-02-02 LAB — CBC
HCT: 41.7 % (ref 39.0–52.0)
Hemoglobin: 13.3 g/dL (ref 13.0–17.0)
MCH: 29.4 pg (ref 26.0–34.0)
MCHC: 31.9 g/dL (ref 30.0–36.0)
MCV: 92.3 fL (ref 78.0–100.0)
Platelets: 179 10*3/uL (ref 150–400)
RBC: 4.52 MIL/uL (ref 4.22–5.81)
RDW: 13.3 % (ref 11.5–15.5)
WBC: 9.7 10*3/uL (ref 4.0–10.5)

## 2018-02-02 NOTE — Evaluation (Signed)
Physical Therapy Evaluation Patient Details Name: Zachary [name] MRN: [id number] DOB: 20-Mar-1946 Today's Date: 02/02/2018  LEFT KNEE ROM:  6-72 degrees AMBULATION DISTANCE: 30 feet with Min assist using RW LEFT KNEE CPM ROM: 0-70 degrees   History of Present Illness  Zachary [name] is a 72 y/o male, s/p Left TKA 02/01/18  with the diagnosis of PRIMARY OSTEOARTHRITIS KNEE    Clinical Impression  Patient c/o severe pain when moving left knee and advised to take pain medication when due, patient demonstrates slow labored movement with fair return for attempting left heel to toe stepping, mostly limited for gait due to left knee pain and tolerated sitting up in chair with LLE dangling after therapy.  Patient will benefit from continued physical therapy in hospital and recommended venue below to increase strength, balance, endurance for safe ADLs and gait.    Follow Up Recommendations SNF    Equipment Recommendations  Rolling walker with 5" wheels    Recommendations for Other Services       Precautions / Restrictions Precautions Precautions: Fall Restrictions Weight Bearing Restrictions: Yes LLE Weight Bearing: Weight bearing as tolerated Other Position/Activity Restrictions: no pillows under left knee      Mobility  Bed Mobility Overal bed mobility: Needs Assistance Bed Mobility: Sit to Supine       Sit to supine: Min assist      Transfers Overall transfer level: Needs assistance Equipment used: Rolling walker (2 wheeled) Transfers: Sit to/from [facility] Sit to Stand: Min assist Stand pivot transfers: Min assist       General transfer comment: slow labored movement  Ambulation/Gait Ambulation/Gait assistance: Min assist Ambulation Distance (Feet): 30 Feet Assistive device: Rolling walker (2 wheeled) Gait Pattern/deviations: Decreased stance time - left;Decreased stride length   Gait velocity interpretation: Below normal speed for  age/gender General Gait Details: Patient demonstrates slow unsteady short steps, no loss of balance, limited mostly due to increasing left knee pain, fatigue  Stairs            Wheelchair Mobility    Modified Rankin (Stroke Patients Only)       Balance Overall balance assessment: Needs assistance Sitting-balance support: Feet supported;No upper extremity supported Sitting balance-Leahy Scale: Good     Standing balance support: Bilateral upper extremity supported;During functional activity Standing balance-Leahy Scale: Fair                               Pertinent Vitals/Pain Pain Assessment: Faces Faces Pain Scale: Hurts even more Pain Location: no pain at rest, painful with movement Pain Descriptors / Indicators: Sharp;Aching;Grimacing Pain Intervention(s): Limited activity within patient's tolerance;Monitored during session;Patient requesting pain meds-RN notified    Home Living Family/patient expects to be discharged to:: Private residence Living Arrangements: Spouse/significant other Available Help at Discharge: Other (Comment)(spouse unable to help per patient) Type of Home: House Home Access: Level entry     Home Layout: One level Home Equipment: Shower seat;Cane - single point Additional Comments: states his present RW is to old to use    Prior Function Level of Independence: Independent with assistive device(s)         Comments: ambulates with SPC     Hand Dominance        Extremity/Trunk Assessment   Upper Extremity Assessment Upper Extremity Assessment: Defer to OT evaluation    Lower Extremity Assessment Lower Extremity Assessment: Overall WFL for tasks assessed;LLE deficits/detail LLE Deficits / Details:  grossly -3/5 secondary to pain       Communication   Communication: No difficulties  Cognition Arousal/Alertness: Awake/alert Behavior During Therapy: WFL for tasks assessed/performed Overall Cognitive Status: Within  Functional Limits for tasks assessed                                        General Comments      Exercises Total Joint Exercises Ankle Circles/Pumps: Supine;AROM;Strengthening;Both;5 reps Quad Sets: Supine;AROM;Strengthening;Left;5 reps Gluteal Sets: Supine;AROM;Strengthening;Both;5 reps Short Arc Quad: Supine;AROM;Strengthening;Left;10 reps Heel Slides: Supine;AROM;Strengthening;Left;5 reps   Assessment/Plan    PT Assessment Patient needs continued PT services  PT Problem List Decreased strength;Decreased range of motion;Decreased activity tolerance;Decreased balance;Decreased mobility       PT Treatment Interventions Gait training;Functional mobility training;Therapeutic activities;Patient/family education    PT Goals (Current goals can be found in the Care Plan section)  Acute Rehab PT Goals Patient Stated Goal: return home after rehab PT Goal Formulation: With patient Time For Goal Achievement: 02/16/18 Potential to Achieve Goals: Good    Frequency 7X/week   Barriers to discharge        Co-evaluation               AM-PAC PT "6 Clicks" Daily Activity  Outcome Measure Difficulty turning over in bed (including adjusting bedclothes, sheets and blankets)?: A Little Difficulty moving from lying on back to sitting on the side of the bed? : A Little Difficulty sitting down on and standing up from a chair with arms (e.g., wheelchair, bedside commode, etc,.)?: A Lot Help needed moving to and from a bed to chair (including a wheelchair)?: A Lot Help needed walking in hospital room?: A Lot Help needed climbing 3-5 steps with a railing? : A Lot 6 Click Score: 14    End of Session   Activity Tolerance: Patient tolerated treatment well;Patient limited by pain Patient left: in chair;with call bell/phone within reach Nurse Communication: Mobility status;Other (comment)(RN aware patient left up in chair) PT Visit Diagnosis: Unsteadiness on feet  (R26.81);Other abnormalities of gait and mobility (R26.89);Muscle weakness (generalized) (M62.81)    Time: 6387-5643 PT Time Calculation (min) (ACUTE ONLY): 39 min   Charges:   PT Evaluation $PT Eval Moderate Complexity: 1 Mod PT Treatments $Gait Training: 8-22 mins $Therapeutic Activity: 8-22 mins   PT G Codes:        3:06 PM, Feb 18, 2018 Lonell Grandchild, MPT Physical Therapist with [facility] [phone] office [phone] mobile phone

## 2018-02-02 NOTE — Progress Notes (Signed)
Subjective: 1 Day Post-Op Procedure(s) (LRB): TOTAL KNEE ARTHROPLASTY (Left) Patient reports pain as mild.    Objective: BP (!) 118/59 (BP Location: Left Arm)   Pulse 86   Temp 98.7 F (37.1 C) (Oral)   Resp 18   SpO2 94%   Intake/Output from previous day:  Intake/Output Summary (Last 24 hours) at 02/02/2018 0818 Last data filed at 02/02/2018 0500 Gross per 24 hour  Intake 1235.08 ml  Output 2185 ml  Net -949.92 ml    Recent Labs    02/02/18 0522  HGB 13.3   Recent Labs    02/02/18 0522  WBC 9.7  RBC 4.52  HCT 41.7  PLT 179   Recent Labs    02/02/18 0522  NA 137  K 3.5  CL 98*  CO2 27  BUN 12  CREATININE 1.21  GLUCOSE 139*  CALCIUM 8.1*   No results for input(s): LABPT, INR in the last 72 hours.  Neurologically intact ABD soft Neurovascular intact Sensation intact distally Intact pulses distally Dorsiflexion/Plantar flexion intact Incision: scant drainage No cellulitis present Compartment soft  Assessment/Plan: 1 Day Post-Op Procedure(s) (LRB): TOTAL KNEE ARTHROPLASTY (Left) Advance diet Up with therapy D/C IV fluids Plan for discharge tomorrow Discharge to SNF  [facility] 02/02/2018, 8:17 AM

## 2018-02-02 NOTE — Plan of Care (Signed)
  Acute Rehab PT Goals(only PT should resolve) Pt Will Go Supine/Side To Sit 02/02/2018 1507 - Progressing by Lonell Grandchild, PT Flowsheets Taken 02/02/2018 1507  Pt will go Supine/Side to Sit with min guard assist Patient Will Transfer Sit To/From Stand 02/02/2018 1507 - Progressing by Lonell Grandchild, PT Flowsheets Taken 02/02/2018 1507  Patient will transfer sit to/from stand with min guard assist Pt Will Transfer Bed To Chair/Chair To Bed 02/02/2018 1507 - Progressing by Lonell Grandchild, PT Flowsheets Taken 02/02/2018 1507  Pt will Transfer Bed to Chair/Chair to Bed min guard assist Pt Will Ambulate 02/02/2018 1507 - Progressing by Lonell Grandchild, PT Flowsheets Taken 02/02/2018 1507  Pt will Ambulate with supervision;75 feet;with rolling walker  3:08 PM, 02/02/18 Lonell Grandchild, MPT Physical Therapist with [facility] [phone] office [phone] mobile phone

## 2018-02-02 NOTE — Anesthesia Postprocedure Evaluation (Signed)
Anesthesia Post Note  Patient: Zachary [name]  Procedure(s) Performed: TOTAL KNEE ARTHROPLASTY (Left Knee)  Patient location during evaluation: Nursing Unit Anesthesia Type: Spinal Level of consciousness: awake and alert and patient cooperative Pain management: pain level controlled Vital Signs Assessment: post-procedure vital signs reviewed and stable Respiratory status: spontaneous breathing, nonlabored ventilation and respiratory function stable Cardiovascular status: blood pressure returned to baseline Postop Assessment: no apparent nausea or vomiting, adequate PO intake and patient able to bend at knees Anesthetic complications: no     Last Vitals:  Vitals:   02/02/18 0845 02/02/18 1130  BP: 126/70   Pulse: 88   Resp: 20   Temp:  37.1 C  SpO2: 93%     Last Pain:  Vitals:   02/02/18 1130  TempSrc: Oral  PainSc:                  Caelyn Route J

## 2018-02-02 NOTE — NC FL2 (Signed)
[place] LEVEL OF CARE SCREENING TOOL     IDENTIFICATION  Patient Name: Zachary [name] Birthdate: 1946/05/16 Sex: male Admission Date (Current Location): 02/01/2018  [facility] and [place] Number:  [employer] and Address:  [place] [address] [place]      Provider Number: [id number]  Attending Physician Name and Address:  Carole Civil, MD  Relative Name and Phone Number:       Current Level of Care: Hospital Recommended Level of Care: [place] Prior Approval Number:    Date Approved/Denied:   PASRR Number: [id number]  Discharge Plan: SNF    Current Diagnoses: Patient Active Problem List   Diagnosis Date Noted  . Primary osteoarthritis of left knee 02/01/2018  . Postural hypotension 07/23/2016  . Dysphagia 07/23/2016  . Obstructive sleep apnea 07/21/2016  . Hyperglycemia 07/21/2016  . Anemia, unspecified 07/21/2016  . Spinal stenosis of lumbar region 07/15/2016  . Bladder tumor 07/27/2015  . HNP (herniated nucleus pulposus), cervical 06/05/2013    Class: Diagnosis of  . 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  . Muscle spasms of neck 04/04/2013  . 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 OA (osteoarthritis) of knee 02/22/2013  . Iliotibial band syndrome of left side 11/22/2012  . Localized, primary osteoarthritis of the ankle and foot 10/11/2012  . Difficulty in walking(719.7) 08/17/2012  . Peroneal tendinitis 08/16/2012  . Precordial pain 02/09/2012  . Essential hypertension, benign 02/09/2012  . Mixed hyperlipidemia 02/09/2012  . Dysthymia 02/04/2012  . Abnormality of gait 12/23/2011  . Lateral epicondylitis/tennis elbow 05/06/2011  . TENOSYNOVITIS OF FOOT AND ANKLE 10/22/2010    Orientation RESPIRATION BLADDER Height & Weight     Self, Time, Situation, Place  Normal Continent Weight:   Height:     BEHAVIORAL SYMPTOMS/MOOD NEUROLOGICAL BOWEL NUTRITION STATUS   Continent Diet(Regular)  AMBULATORY STATUS COMMUNICATION OF NEEDS Skin   Limited Assist Verbally Surgical wounds(left knee)                       Personal Care Assistance Level of Assistance  Bathing, Feeding, Dressing Bathing Assistance: Limited assistance Feeding assistance: Independent Dressing Assistance: Limited assistance     Functional Limitations Info  Sight, Hearing, Speech Sight Info: Adequate Hearing Info: Adequate Speech Info: Adequate    SPECIAL CARE FACTORS FREQUENCY  PT (By licensed PT)     PT Frequency: 5x/week              Contractures Contractures Info: Not present    Additional Factors Info  Code Status, Allergies Code Status Info: Full Code Allergies Info: Sulfonamide Derivatives           Current Medications (02/02/2018):  This is the current hospital active medication list Current Facility-Administered Medications  Medication Dose Route Frequency Provider Last Rate Last Dose  . acetaminophen (TYLENOL) tablet 650 mg  650 mg Oral Q4H PRN Carole Civil, MD   650 mg at 02/01/18 2106   Or  . acetaminophen (TYLENOL) suppository 650 mg  650 mg Rectal Q4H PRN Carole Civil, MD      . alum & mag hydroxide-simeth (MAALOX/MYLANTA) 200-200-20 MG/5ML suspension 30 mL  30 mL Oral Q4H PRN Carole Civil, MD      . aspirin chewable tablet 81 mg  81 mg Oral BID Carole Civil, MD   81 mg at 02/02/18 1009  . carbidopa-levodopa (SINEMET IR) 25-100 MG per tablet immediate release 1  tablet  1 tablet Oral TID AC Carole Civil, MD   1 tablet at 02/02/18 1426  . diazepam (VALIUM) tablet 5 mg  5 mg Oral BID PRN Carole Civil, MD      . diphenhydrAMINE (BENADRYL) 12.5 MG/5ML elixir 12.5-25 mg  12.5-25 mg Oral Q4H PRN Carole Civil, MD   12.5 mg at 02/01/18 2106  . docusate sodium (COLACE) capsule 100 mg  100 mg Oral BID Carole Civil, MD   100 mg at 02/02/18 1009  . DULoxetine (CYMBALTA) DR capsule 60 mg  60 mg Oral  Daily Carole Civil, MD   60 mg at 02/02/18 1054  . fluticasone (FLONASE) 50 MCG/ACT nasal spray 1-2 spray  1-2 spray Each Nare Daily PRN Carole Civil, MD      . irbesartan (AVAPRO) tablet 300 mg  300 mg Oral Daily Carole Civil, MD   300 mg at 02/02/18 1009   And  . hydrochlorothiazide (MICROZIDE) capsule 12.5 mg  12.5 mg Oral Daily Carole Civil, MD   12.5 mg at 02/02/18 1054  . HYDROcodone-acetaminophen (NORCO) 7.5-325 MG per tablet 1 tablet  1 tablet Oral Q4H PRN Carole Civil, MD   1 tablet at 02/02/18 1426  . HYDROmorphone (DILAUDID) injection 0.5 mg  0.5 mg Intravenous Q2H PRN Carole Civil, MD   0.5 mg at 02/01/18 1600  . ketorolac (TORADOL) 15 MG/ML injection 15 mg  15 mg Intravenous Q6H Carole Civil, MD   15 mg at 02/02/18 1309  . magnesium citrate solution 1 Bottle  1 Bottle Oral Once PRN Carole Civil, MD      . magnesium hydroxide (MILK OF MAGNESIA) suspension 30 mL  30 mL Oral Daily PRN Carole Civil, MD      . menthol-cetylpyridinium (CEPACOL) lozenge 3 mg  1 lozenge Oral PRN Carole Civil, MD       Or  . phenol (CHLORASEPTIC) mouth spray 1 spray  1 spray Mouth/Throat PRN Carole Civil, MD      . methocarbamol (ROBAXIN) tablet 500 mg  500 mg Oral Q6H PRN Carole Civil, MD       Or  . methocarbamol (ROBAXIN) 500 mg in dextrose 5 % 50 mL IVPB  500 mg Intravenous Q6H PRN Carole Civil, MD      . metoCLOPramide (REGLAN) tablet 5-10 mg  5-10 mg Oral Q8H PRN Carole Civil, MD       Or  . metoCLOPramide (REGLAN) injection 5-10 mg  5-10 mg Intravenous Q8H PRN Carole Civil, MD      . ondansetron [facility]) tablet 4 mg  4 mg Oral Q6H PRN Carole Civil, MD       Or  . ondansetron [facility]) injection 4 mg  4 mg Intravenous Q6H PRN Carole Civil, MD      . oxyCODONE-acetaminophen (PERCOCET/ROXICET) 5-325 MG per tablet 1 tablet  1 tablet Oral Q4H PRN Carole Civil, MD   1 tablet at  02/01/18 2338  . polyvinyl alcohol (LIQUIFILM TEARS) 1.4 % ophthalmic solution 1 drop  1 drop Both Eyes TID PRN Carole Civil, MD      . sorbitol 70 % solution 30 mL  30 mL Oral Daily PRN Carole Civil, MD         Discharge Medications: Please see discharge summary for a list of discharge medications.  Relevant Imaging Results:  Relevant Lab Results:   Additional Information SSN:  [place], LCSW

## 2018-02-02 NOTE — Clinical Social Work Note (Signed)
Clinical Social Work Assessment  Patient Details  Name: Zachary [name] MRN: [id number] Date of Birth: 1946-07-05  Date of referral:  02/02/18               Reason for consult:  Facility Placement                Permission sought to share information with:    Permission granted to share information::     Name::        Agency::     Relationship::     Contact Information:  spouse was at bedside.   Housing/Transportation Living arrangements for the past 2 months:  [place] of Information:  Patient, Spouse Patient Interpreter Needed:  None Criminal Activity/Legal Involvement Pertinent to Current Situation/Hospitalization:  No - Comment as needed Significant Relationships:  Spouse Lives with:  Spouse Do you feel safe going back to the place where you live?  Yes Need for family participation in patient care:  No (Coment)  Care giving concerns:  None identified at baseline. Wife states that she is currently unable to provide the level of care that patient needs due to her fibromyalgia.    Social Worker assessment / plan: At baseline patient is independent. He volunteers with [employer] Patient states that he needs SNF for STR due to his wife's inability to take care of him due to her fibromyalgia.   He request [facility] for rehab.   Employment status:  Retired Nurse, adult PT Recommendations:  [place] / Referral to community resources:  [place]  Patient/Family's Response to care:  Patient is agreeable to STR at [facility].   Patient/Family's Understanding of and Emotional Response to Diagnosis, Current Treatment, and Prognosis:  Patient and family understand patient's diagnosis, treatment and prognosis.   Emotional Assessment Appearance:  Appears stated age Attitude/Demeanor/Rapport:    Affect (typically observed):  Accepting Orientation:  Oriented to Self, Oriented to Place, Oriented to  Time,  Oriented to Situation Alcohol / Substance use:  Not Applicable Psych involvement (Current and /or in the community):  No (Comment)  Discharge Needs  Concerns to be addressed:  No discharge needs identified Readmission within the last 30 days:  No Current discharge risk:  None Barriers to Discharge:  No Barriers Identified   Ihor Gully, LCSW 02/02/2018, 4:58 PM

## 2018-02-02 NOTE — Progress Notes (Signed)
Patient and wife expressed concerns regarding discharge plans. Stated would like to speak with social worker regarding possibility of placement at [facility] for therapy at discharge. Notified [employer], CSW. Pt seen by CSW this afternoon. Donavan Foil, RN

## 2018-02-02 NOTE — Addendum Note (Signed)
Addendum  created 02/02/18 1200 by Charmaine Downs, CRNA   Sign clinical note

## 2018-02-02 NOTE — Progress Notes (Signed)
OT Cancellation Note  Patient Details Name: Zachary [name] MRN: [id number] DOB: 1946/12/14   Cancelled Treatment:    Reason Eval/Treat Not Completed: Patient declined, no reason specified. Pt just beginning breakfast on OT arrival. Will attempt evaluation at a later time.   Guadelupe Sabin, OTR/L  [phone] 02/02/2018, 8:31 AM

## 2018-02-03 LAB — CBC
HEMATOCRIT: 37.1 % — AB (ref 39.0–52.0)
HEMOGLOBIN: 12.2 g/dL — AB (ref 13.0–17.0)
MCH: 29.6 pg (ref 26.0–34.0)
MCHC: 32.9 g/dL (ref 30.0–36.0)
MCV: 90 fL (ref 78.0–100.0)
Platelets: 188 10*3/uL (ref 150–400)
RBC: 4.12 MIL/uL — ABNORMAL LOW (ref 4.22–5.81)
RDW: 13 % (ref 11.5–15.5)
WBC: 16.4 10*3/uL — AB (ref 4.0–10.5)

## 2018-02-03 NOTE — Clinical Social Work Note (Addendum)
SW following. Pt is accepted at [facility] if insurance authorizes. Spoke with Tammy at insurance twice today and they have not yet reviewed pt. Messaged Dr. Aline Brochure to update. DC timeframe not finalized. Will follow.  4:20PM  Received call from Tammy at pt's insurance stating that they will approve SNF for 7 days with expected transfer to SNF on 02/04/18. Pt will have a $10 or $20 copay per day (depending on his plan) starting on day one. After day 20, copay would increase to $160/day. Auth [place] [id number]  LCSW, Ambrose Pancoast, will follow up tomorrow to update pt and facilitate dc.

## 2018-02-03 NOTE — Evaluation (Signed)
Occupational Therapy Evaluation Patient Details Name: Zachary [name] MRN: [id number] DOB: 1946/08/20 Today's Date: 02/03/2018    History of Present Illness Zachary [name] is a 72 y/o male, s/p Left TKA 02/01/18  with the diagnosis of PRIMARY OSTEOARTHRITIS KNEE     Clinical Impression   Pt received sitting at EOB, agreeable to OT evaluation. Pt performing seated ADLs with set-up/supervision, unable to complete LB tasks due to pain when attempting to bend over and reach feet. Pt performing standing ADLs with supervision/min guard. Pt reports wife can assist with ADLs such as donning LB clothing, however is unable to provide physical assistance. No further OT services required at this time.      Follow Up Recommendations  No OT follow up;Supervision/Assistance - 24 hour    Equipment Recommendations  None recommended by OT       Precautions / Restrictions Precautions Precautions: Fall Restrictions Weight Bearing Restrictions: Yes LLE Weight Bearing: Weight bearing as tolerated Other Position/Activity Restrictions: no pillows under left knee      Mobility Bed Mobility               General bed mobility comments: pt seated at EOB on OT arrival  Transfers Overall transfer level: Needs assistance Equipment used: Rolling walker (2 wheeled) Transfers: Sit to/from Stand Sit to Stand: Min guard                  ADL either performed or assessed with clinical judgement   ADL Overall ADL's : Needs assistance/impaired     Grooming: Supervision/safety;Standing               Lower Body Dressing: Moderate assistance;Sitting/lateral leans Lower Body Dressing Details (indicate cue type and reason): assist for donning socks Toilet Transfer: Min guard;Ambulation;Regular Toilet;RW   Toileting- Clothing Manipulation and Hygiene: Modified independent;Sitting/lateral lean       Functional mobility during ADLs: Min guard;Rolling walker       Vision Baseline  Vision/History: No visual deficits Patient Visual Report: No change from baseline Vision Assessment?: No apparent visual deficits            Pertinent Vitals/Pain Pain Assessment: 0-10 Pain Score: 6  Pain Location: no pain at rest, painful with movement Pain Descriptors / Indicators: Sharp;Aching;Grimacing Pain Intervention(s): Limited activity within patient's tolerance;Monitored during session;Premedicated before session;Repositioned     Hand Dominance Right   Extremity/Trunk Assessment Upper Extremity Assessment Upper Extremity Assessment: Overall WFL for tasks assessed   Lower Extremity Assessment Lower Extremity Assessment: Defer to PT evaluation   Cervical / Trunk Assessment Cervical / Trunk Assessment: Normal   Communication Communication Communication: No difficulties   Cognition Arousal/Alertness: Awake/alert Behavior During Therapy: WFL for tasks assessed/performed Overall Cognitive Status: Within Functional Limits for tasks assessed                                                Home Living Family/patient expects to be discharged to:: Private residence Living Arrangements: Spouse/significant other Available Help at Discharge: Other (Comment)(wife unable to provide physical assistance) Type of Home: House Home Access: Level entry     Home Layout: One level     Bathroom Shower/Tub: Teacher, early years/pre: Standard     Home Equipment: Shower seat;Cane - single point          Prior Functioning/Environment Level of Independence: Independent  with assistive device(s)        Comments: ambulates with SPC        OT Problem List: Decreased activity tolerance;Impaired balance (sitting and/or standing);Pain       End of Session Equipment Utilized During Treatment: Gait belt;Rolling walker  Activity Tolerance: Patient tolerated treatment well Patient left: in chair;with call bell/phone within reach  OT Visit  Diagnosis: Muscle weakness (generalized) (M62.81);Pain Pain - Right/Left: Left Pain - part of body: Knee                Time: 7505-1833 OT Time Calculation (min): 21 min Charges:  OT General Charges $OT Visit: 1 Visit OT Evaluation $OT Eval Low Complexity: [place], OTR/L  [phone] 02/03/2018, 8:04 AM

## 2018-02-03 NOTE — Progress Notes (Signed)
Patient ID: Zachary [name], male   DOB: 1946/06/04, 72 y.o.   MRN: [id number] BP 121/62   Pulse (!) 103   Temp 98.3 F (36.8 C)   Resp 16   SpO2 96%   Postop day 2 left total knee.  The patient was doing well until therapy this afternoon he thinks he may have overdone it we had to give him Dilaudid and Percocet in addition to hydrocodone to try to get his pain under control  He otherwise looks good his knee looks fine he is participating in physical therapy as expected  He is scheduled to go to the [facility] when his insurance is approved

## 2018-02-03 NOTE — Progress Notes (Signed)
PT Cancellation Note  Patient Details Name: Zachary [name] MRN: [id number] DOB: [birth year]   Cancelled Treatment:    Reason Eval/Treat Not Completed: Other (comment).   In a lot of pain and declined visit, but is getting oral meds and nursing is working on repairing his ice for knee pain.  Try later as time and pt allow.   Ramond Dial 02/03/2018, 1:26 PM   1:26 PM, 02/03/18 Mee Hives, PT, MS Physical Therapist - [place] [phone] (Office)

## 2018-02-03 NOTE — Progress Notes (Signed)
PT Cancellation Note  Patient Details Name: Zachary [name] MRN: [id number] DOB: 09/25/1946   Cancelled Treatment:    Reason Eval/Treat Not Completed: Pain limiting ability to participate;Medical issues which prohibited therapy Pt limited by pain 10/10, RN aware and medication given.    [address] LPTA; [place]  Aldona Lento 02/03/2018, 3:27 PM

## 2018-02-03 NOTE — Progress Notes (Signed)
Physical Therapy Treatment Patient Details Name: Zachary [name] MRN: [id number] DOB: 06/13/1946 Today's Date: 02/03/2018  LEFT KNEE PROM:  5-90 degrees AMBULATION DISTANCE: 120 feet with Min guard using RW LEFT KNEE CPM ROM: 0-70 degrees    History of Present Illness Zachary [name] is a 72 y/o male, s/p Left TKA 02/01/18  with the diagnosis of PRIMARY OSTEOARTHRITIS KNEE      PT Comments    Patient demonstrates increased left knee ROM while performing self flexion using RLE,  Increased tolerance/endurance for gait training with fair/good return for left heel to toe stepping after verbal cues and continued sitting up in chair with LLE dangling.  Patient will benefit from continued physical therapy in hospital and recommended venue below to increase strength, balance, endurance for safe ADLs and gait.   Follow Up Recommendations  SNF     Equipment Recommendations  Rolling walker with 5" wheels    Recommendations for Other Services       Precautions / Restrictions Precautions Precautions: Fall Restrictions Weight Bearing Restrictions: Yes LLE Weight Bearing: Weight bearing as tolerated Other Position/Activity Restrictions: no pillows under left knee    Mobility  Bed Mobility               General bed mobility comments: Pateint presents seated in chair  Transfers Overall transfer level: Needs assistance Equipment used: Rolling walker (2 wheeled) Transfers: Sit to/from Stand;Stand Pivot Transfers Sit to Stand: Min assist Stand pivot transfers: Min guard       General transfer comment: requires VC's for proper hand placement and Min assist for sit to stands  Ambulation/Gait Ambulation/Gait assistance: Min guard Ambulation Distance (Feet): 120 Feet Assistive device: Rolling walker (2 wheeled) Gait Pattern/deviations: Decreased step length - left;Decreased stance time - left;Decreased stride length   Gait velocity interpretation: Below normal speed for  age/gender General Gait Details: demonstrates increased endurance/tolerance with good return for left heel to toe stepping after VC's, had one episode of near loss of balance, but able to self correct   Stairs            Wheelchair Mobility    Modified Rankin (Stroke Patients Only)       Balance Overall balance assessment: Needs assistance Sitting-balance support: Feet supported;No upper extremity supported Sitting balance-Leahy Scale: Good     Standing balance support: Bilateral upper extremity supported;During functional activity Standing balance-Leahy Scale: Fair                              Cognition Arousal/Alertness: Awake/alert Behavior During Therapy: WFL for tasks assessed/performed Overall Cognitive Status: Within Functional Limits for tasks assessed                                        Exercises Total Joint Exercises Quad Sets: AROM;Strengthening;Left;Seated;10 reps Knee Flexion: Seated;PROM;Left(seated self left knee flexion using RLE, 30 second holds x 2) Goniometric ROM: left kne rom: 5-90 degrees    General Comments        Pertinent Vitals/Pain Pain Assessment: Faces Pain Score: 6  Faces Pain Scale: Hurts little more Pain Location: left knee Pain Descriptors / Indicators: Sharp;Aching;Grimacing Pain Intervention(s): Limited activity within patient's tolerance;Monitored during session    [place] expects to be discharged to:: Private residence Living Arrangements: Spouse/significant other Available Help at Discharge: Other (Comment)(wife unable to provide physical  assistance) Type of Home: House Home Access: Level entry   Home Layout: One level Home Equipment: Shower seat;Cane - single point      Prior Function Level of Independence: Independent with assistive device(s)      Comments: ambulates with SPC   PT Goals (current goals can now be found in the care plan section) Acute Rehab PT  Goals Patient Stated Goal: return home after rehab PT Goal Formulation: With patient/family Time For Goal Achievement: 02/16/18 Potential to Achieve Goals: Good Progress towards PT goals: Progressing toward goals    Frequency    7X/week      PT Plan Current plan remains appropriate    Co-evaluation              AM-PAC PT "6 Clicks" Daily Activity  Outcome Measure  Difficulty turning over in bed (including adjusting bedclothes, sheets and blankets)?: A Little Difficulty moving from lying on back to sitting on the side of the bed? : A Little Difficulty sitting down on and standing up from a chair with arms (e.g., wheelchair, bedside commode, etc,.)?: A Little Help needed moving to and from a bed to chair (including a wheelchair)?: A Little Help needed walking in hospital room?: A Little Help needed climbing 3-5 steps with a railing? : A Lot 6 Click Score: 17    End of Session   Activity Tolerance: Patient tolerated treatment well;Patient limited by pain Patient left: in chair;with call bell/phone within reach;with family/visitor present Nurse Communication: Mobility status PT Visit Diagnosis: Unsteadiness on feet (R26.81);Other abnormalities of gait and mobility (R26.89);Muscle weakness (generalized) (M62.81)     Time: 8032-1224 PT Time Calculation (min) (ACUTE ONLY): 28 min  Charges:  $Gait Training: 8-22 mins $Therapeutic Exercise: 8-22 mins                    G Codes:       11:58 AM, 16-Feb-2018 Lonell Grandchild, MPT Physical Therapist with [facility] [phone] office [phone] mobile phone

## 2018-02-04 ENCOUNTER — Inpatient Hospital Stay
Admission: RE | Admit: 2018-02-04 | Discharge: 2018-02-18 | Disposition: A | Discharge status: Home / Self Care | Payer: PPO | Source: Ambulatory Visit | Attending: Internal Medicine | Admitting: Internal Medicine

## 2018-02-04 DIAGNOSIS — R42 Dizziness and giddiness: Secondary | ICD-10-CM | POA: Diagnosis not present

## 2018-02-04 DIAGNOSIS — M24562 Contracture, left knee: Secondary | ICD-10-CM | POA: Diagnosis not present

## 2018-02-04 DIAGNOSIS — G20 Parkinson's disease: Secondary | ICD-10-CM | POA: Diagnosis not present

## 2018-02-04 DIAGNOSIS — F5105 Insomnia due to other mental disorder: Secondary | ICD-10-CM | POA: Diagnosis not present

## 2018-02-04 DIAGNOSIS — Z96652 Presence of left artificial knee joint: Secondary | ICD-10-CM | POA: Diagnosis not present

## 2018-02-04 DIAGNOSIS — I10 Essential (primary) hypertension: Secondary | ICD-10-CM | POA: Diagnosis not present

## 2018-02-04 DIAGNOSIS — Z96651 Presence of right artificial knee joint: Secondary | ICD-10-CM | POA: Diagnosis not present

## 2018-02-04 DIAGNOSIS — R000 Tachycardia, unspecified: Secondary | ICD-10-CM | POA: Diagnosis not present

## 2018-02-04 DIAGNOSIS — F411 Generalized anxiety disorder: Secondary | ICD-10-CM | POA: Diagnosis not present

## 2018-02-04 DIAGNOSIS — G4733 Obstructive sleep apnea (adult) (pediatric): Secondary | ICD-10-CM | POA: Diagnosis not present

## 2018-02-04 DIAGNOSIS — F418 Other specified anxiety disorders: Secondary | ICD-10-CM | POA: Diagnosis not present

## 2018-02-04 DIAGNOSIS — M6281 Muscle weakness (generalized): Secondary | ICD-10-CM | POA: Diagnosis not present

## 2018-02-04 DIAGNOSIS — F329 Major depressive disorder, single episode, unspecified: Secondary | ICD-10-CM | POA: Diagnosis not present

## 2018-02-04 DIAGNOSIS — R262 Difficulty in walking, not elsewhere classified: Secondary | ICD-10-CM | POA: Diagnosis not present

## 2018-02-04 LAB — CBC
HEMATOCRIT: 37.2 % — AB (ref 39.0–52.0)
HEMOGLOBIN: 12.1 g/dL — AB (ref 13.0–17.0)
MCH: 29.5 pg (ref 26.0–34.0)
MCHC: 32.5 g/dL (ref 30.0–36.0)
MCV: 90.7 fL (ref 78.0–100.0)
Platelets: 210 10*3/uL (ref 150–400)
RBC: 4.1 MIL/uL — ABNORMAL LOW (ref 4.22–5.81)
RDW: 13.6 % (ref 11.5–15.5)
WBC: 12.8 10*3/uL — ABNORMAL HIGH (ref 4.0–10.5)

## 2018-02-04 MED ORDER — BISACODYL 10 MG RE SUPP
10.0000 mg | Freq: Every day | RECTAL | Status: DC | PRN
  Administered 2018-02-04: 10 mg via RECTAL
  Filled 2018-02-04: qty 1

## 2018-02-04 MED ORDER — METHOCARBAMOL 500 MG PO TABS: 500.0000 mg | ORAL_TABLET | Freq: Four times a day (QID) | ORAL | 0 refills | Status: DC | PRN

## 2018-02-04 MED ORDER — DOCUSATE SODIUM 100 MG PO CAPS: 100.0000 mg | ORAL_CAPSULE | Freq: Two times a day (BID) | ORAL | 0 refills | Status: DC

## 2018-02-04 MED ORDER — BISACODYL 10 MG RE SUPP: 10.0000 mg | Freq: Every day | RECTAL | 0 refills | Status: DC | PRN

## 2018-02-04 MED ORDER — ASPIRIN 81 MG PO CHEW
81.0000 mg | CHEWABLE_TABLET | Freq: Two times a day (BID) | ORAL | 0 refills | Status: DC
Start: 2018-02-04 — End: 2018-09-05

## 2018-02-04 MED ORDER — OXYCODONE-ACETAMINOPHEN 5-325 MG PO TABS: 1.0000 | ORAL_TABLET | ORAL | 0 refills | Status: DC | PRN

## 2018-02-04 NOTE — Clinical Social Work Placement (Signed)
   CLINICAL SOCIAL WORK PLACEMENT  NOTE  Date:  02/04/2018  Patient Details  Name: Zachary [name] MRN: [id number] Date of Birth: [birth year]  Clinical Social Work is seeking post-discharge placement for this patient at the [place] level of care (*CSW will initial, date and re-position this form in  chart as items are completed):  Yes   Patient/family provided with [place] Work Department's list of facilities offering this level of care within the geographic area requested by the patient (or if unable, by the patient's family).  Yes   Patient/family informed of their freedom to choose among providers that offer the needed level of care, that participate in Medicare, Medicaid or managed care program needed by the patient, have an available bed and are willing to accept the patient.  Yes   Patient/family informed of [place]'s ownership interest in [facility] and [facility], as well as of the fact that they are under no obligation to receive care at these facilities.  PASRR submitted to EDS on       PASRR number received on       Existing PASRR number confirmed on 02/02/18     FL2 transmitted to all facilities in geographic area requested by pt/family on 02/02/18     FL2 transmitted to all facilities within larger geographic area on       Patient informed that his/her managed care company has contracts with or will negotiate with certain facilities, including the following:        Yes   Patient/family informed of bed offers received.  Patient chooses bed at [facility]     Physician recommends and patient chooses bed at      Patient to be transferred to [facility] on 02/04/18.  Patient to be transferred to facility by [facility] staff     Patient family notified on 02/04/18 of transfer.  Name of family member notified:  wife at bedside.     PHYSICIAN       Additional Comment:     _______________________________________________ Ihor Gully, LCSW 02/04/2018, 11:31 AM

## 2018-02-04 NOTE — Progress Notes (Signed)
Subjective: 3 Days Post-Op Procedure(s) (LRB): TOTAL KNEE ARTHROPLASTY (Left) Patient reports pain as Significantly improvedYesterday afternoon patient had increased pain after rehab was not able to be discharged..    Objective: Vital signs in last 24 hours: Temp:  [97.4 F (36.3 C)-98.3 F (36.8 C)] 97.4 F (36.3 C) (02/08 0032) Pulse Rate:  [77-103] 84 (02/08 0432) Resp:  [16-18] 17 (02/08 0432) BP: (121-145)/(62-75) 124/72 (02/08 0432) SpO2:  [93 %-97 %] 97 % (02/08 0432)  Intake/Output from previous day: 02/07 0701 - 02/08 0700 In: 600 [P.O.:600] Out: 350 [Urine:350] Intake/Output this shift: No intake/output data recorded.  Recent Labs    02/02/18 0522 02/03/18 0403 02/04/18 0643  HGB 13.3 12.2* 12.1*   Recent Labs    02/03/18 0403 02/04/18 0643  WBC 16.4* 12.8*  RBC 4.12* 4.10*  HCT 37.1* 37.2*  PLT 188 210   Recent Labs    02/02/18 0522  NA 137  K 3.5  CL 98*  CO2 27  BUN 12  CREATININE 1.21  GLUCOSE 139*  CALCIUM 8.1*   No results for input(s): LABPT, INR in the last 72 hours.  Neurologically intact ABD soft Neurovascular intact Sensation intact distally Intact pulses distally Dorsiflexion/Plantar flexion intact No cellulitis present Compartment soft  Assessment/Plan: 3 Days Post-Op Procedure(s) (LRB): TOTAL KNEE ARTHROPLASTY (Left) Discharge to SNF when bed available  Arther Abbott 02/04/2018, 7:54 AM

## 2018-02-04 NOTE — Progress Notes (Signed)
Pt's IV site clean dry and intact. Discharge instructions including medications and follow up appointments were reviewed and discussed with patient. All questions were answered and no further questions at this time. Pt in stable condition and in no acute distress at time of discharge. Pt being transferred to [facility]. Report called and given to [facility]. All questions were answered and no further questions at this time. Pt will be escorted by nurse tech.

## 2018-02-04 NOTE — Care Management Important Message (Signed)
Important Message  Patient Details  Name: Zachary [name] MRN: [id number] Date of Birth: 1946-10-11   Medicare Important Message Given:  Yes    Sherald Barge, RN 02/04/2018, 10:16 AM

## 2018-02-04 NOTE — Progress Notes (Signed)
Patient IV came out.  Patient requested IV be left out for now.

## 2018-02-04 NOTE — Discharge Summary (Signed)
Physician Discharge Summary  Patient ID: Zachary [name] MRN: [id number] DOB/AGE: 1946-01-28 72 y.o.  Admit date: 02/01/2018 Discharge date: 02/04/2018  Admission Diagnoses: Primary osteoarthritis left knee  Discharge Diagnoses: Primary osteoarthritis left knee Active Problems:   Primary osteoarthritis of left knee   Discharged Condition: good  Hospital Course:  Hospital day 1 which was February 01, 2018 patient underwent uncomplicated left total knee arthroplasty.  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  Hospital day 2 and 3 he underwent physical therapy tolerated that well until late Thursday afternoon when he had increased pain and had to be medicated with Dilaudid IV and Percocet which controlled his pain well  On hospital day 4 Friday, February 8 he was afebrile vital signs were stable he was neurovascularly intact he did complain of some constipation but his pain was well controlled  CBC Latest Ref Rng & Units 02/04/2018 02/03/2018 02/02/2018  WBC 4.0 - 10.5 K/uL 12.8(H) 16.4(H) 9.7  Hemoglobin 13.0 - 17.0 g/dL 12.1(L) 12.2(L) 13.3  Hematocrit 39.0 - 52.0 % 37.2(L) 37.1(L) 41.7  Platelets 150 - 400 K/uL 210 188 179   BMP Latest Ref Rng & Units 02/02/2018 01/27/2018 08/05/2016  Glucose 65 - 99 mg/dL 139(H) 99 96  BUN 6 - 20 mg/dL 12 17 14   Creatinine 0.61 - 1.24 mg/dL 1.21 1.08 0.98  Sodium 135 - 145 mmol/L 137 141 139  Potassium 3.5 - 5.1 mmol/L 3.5 3.4(L) 4.1  Chloride 101 - 111 mmol/L 98(L) 104 103  CO2 22 - 32 mmol/L 27 26 27   Calcium 8.9 - 10.3 mg/dL 8.1(L) 9.0 8.5(L)     Disposition: Skilled nursing facility pain nursing center   Allergies as of 02/04/2018      Reactions   Sulfonamide Derivatives Itching, Rash, Other (See Comments)   welts      Medication List    STOP taking these medications   HYDROcodone-acetaminophen 5-325 MG tablet Commonly known as:  NORCO/VICODIN     TAKE these medications   acetaminophen 500 MG tablet Commonly known as:   TYLENOL Take 500-1,000 mg by mouth every 6 (six) hours as needed (for pain.).   aspirin 81 MG chewable tablet Chew 1 tablet (81 mg total) by mouth 2 (two) times daily.   bisacodyl 10 MG suppository Commonly known as:  DULCOLAX Place 1 suppository (10 mg total) rectally daily as needed for moderate constipation.   carbidopa-levodopa 25-100 MG tablet Commonly known as:  SINEMET IR Take 1 tablet by mouth 3 (three) times daily before meals.   carboxymethylcellulose 0.5 % Soln Commonly known as:  REFRESH PLUS Place 1 drop into both eyes 3 (three) times daily as needed (for dry eyes.).   diazepam 5 MG tablet Commonly known as:  VALIUM Take 1 tablet (5 mg total) by mouth 2 (two) times daily as needed. for anxiety   docusate sodium 100 MG capsule Commonly known as:  COLACE Take 1 capsule (100 mg total) by mouth 2 (two) times daily.   DULoxetine 60 MG capsule Commonly known as:  CYMBALTA TAKE 1 CAPSULE(60 MG) BY MOUTH DAILY   fluticasone 50 MCG/ACT nasal spray Commonly known as:  FLONASE Place 1-2 sprays into both nostrils daily as needed (for allergies.).   ibuprofen 200 MG tablet Commonly known as:  ADVIL,MOTRIN Take 400 mg by mouth every 8 (eight) hours as needed (for pain.).   methocarbamol 500 MG tablet Commonly known as:  ROBAXIN Take 1 tablet (500 mg total) by mouth every 6 (six)  hours as needed for muscle spasms.   oxyCODONE-acetaminophen 5-325 MG tablet Commonly known as:  PERCOCET/ROXICET Take 1 tablet by mouth every 4 (four) hours as needed for severe pain.   valsartan-hydrochlorothiazide 320-12.5 MG tablet Commonly known as:  DIOVAN-HCT Take 1 tablet by mouth daily.        Signed: Arther Abbott 02/04/2018, 9:42 AM

## 2018-02-04 NOTE — Progress Notes (Signed)
PT Cancellation Note  Patient Details Name: Zachary [name] MRN: [id number] DOB: 12-31-1945   Cancelled Treatment:    Reason Eval/Treat Not Completed: Pain limiting ability to participate.  Patient declined therapy secondary to fear of increasing left knee pain.     1:32 PM, 02/04/18 Lonell Grandchild, MPT Physical Therapist with [facility] [phone] office [phone] mobile phone

## 2018-02-05 ENCOUNTER — Encounter (HOSPITAL_COMMUNITY)
Admission: RE | Admit: 2018-02-05 | Discharge: 2018-02-05 | Disposition: A | Discharge status: Home / Self Care | Payer: PPO | Source: Skilled Nursing Facility | Attending: Internal Medicine | Admitting: Internal Medicine

## 2018-02-05 DIAGNOSIS — I10 Essential (primary) hypertension: Secondary | ICD-10-CM | POA: Insufficient documentation

## 2018-02-05 LAB — CBC WITH DIFFERENTIAL/PLATELET
BASOS ABS: 0 10*3/uL (ref 0.0–0.1)
BASOS PCT: 0 %
EOS ABS: 0.7 10*3/uL (ref 0.0–0.7)
Eosinophils Relative: 6 %
HCT: 36.6 % — ABNORMAL LOW (ref 39.0–52.0)
HEMOGLOBIN: 11.9 g/dL — AB (ref 13.0–17.0)
Lymphocytes Relative: 15 %
Lymphs Abs: 1.7 10*3/uL (ref 0.7–4.0)
MCH: 29.8 pg (ref 26.0–34.0)
MCHC: 32.5 g/dL (ref 30.0–36.0)
MCV: 91.7 fL (ref 78.0–100.0)
Monocytes Absolute: 1 10*3/uL (ref 0.1–1.0)
Monocytes Relative: 9 %
NEUTROS PCT: 70 %
Neutro Abs: 7.6 10*3/uL (ref 1.7–7.7)
Platelets: 229 10*3/uL (ref 150–400)
RBC: 3.99 MIL/uL — ABNORMAL LOW (ref 4.22–5.81)
RDW: 13.5 % (ref 11.5–15.5)
WBC: 11 10*3/uL — AB (ref 4.0–10.5)

## 2018-02-05 LAB — BASIC METABOLIC PANEL
Anion gap: 11 (ref 5–15)
BUN: 19 mg/dL (ref 6–20)
CALCIUM: 8.5 mg/dL — AB (ref 8.9–10.3)
CO2: 30 mmol/L (ref 22–32)
CREATININE: 1.1 mg/dL (ref 0.61–1.24)
Chloride: 98 mmol/L — ABNORMAL LOW (ref 101–111)
Glucose, Bld: 89 mg/dL (ref 65–99)
Potassium: 4 mmol/L (ref 3.5–5.1)
SODIUM: 139 mmol/L (ref 135–145)

## 2018-02-07 ENCOUNTER — Encounter: Payer: Self-pay | Admitting: Internal Medicine

## 2018-02-07 ENCOUNTER — Other Ambulatory Visit: Payer: Self-pay

## 2018-02-07 ENCOUNTER — Non-Acute Institutional Stay (SKILLED_NURSING_FACILITY): Payer: PPO | Admitting: Internal Medicine

## 2018-02-07 DIAGNOSIS — G20 Parkinson's disease: Secondary | ICD-10-CM

## 2018-02-07 DIAGNOSIS — Z96652 Presence of left artificial knee joint: Secondary | ICD-10-CM | POA: Diagnosis not present

## 2018-02-07 DIAGNOSIS — F418 Other specified anxiety disorders: Secondary | ICD-10-CM | POA: Diagnosis not present

## 2018-02-07 DIAGNOSIS — F5105 Insomnia due to other mental disorder: Secondary | ICD-10-CM

## 2018-02-07 DIAGNOSIS — I10 Essential (primary) hypertension: Secondary | ICD-10-CM

## 2018-02-07 LAB — BPAM RBC
BLOOD PRODUCT EXPIRATION DATE: 201903112359
Blood Product Expiration Date: 201903112359
UNIT TYPE AND RH: 5100
Unit Type and Rh: 5100

## 2018-02-07 LAB — TYPE AND SCREEN
ABO/RH(D): O POS
Antibody Screen: NEGATIVE
UNIT DIVISION: 0
Unit division: 0

## 2018-02-07 MED ORDER — DIAZEPAM 5 MG PO TABS: 5.0000 mg | ORAL_TABLET | Freq: Two times a day (BID) | ORAL | 0 refills | Status: DC | PRN

## 2018-02-07 NOTE — Progress Notes (Signed)
Provider:Shamal Stracener, Lyndel Safe   Location:   [facility] Room Number: 131/P Place of Service:  SNF [phone])  PCP: Celene Squibb, MD Patient Care Team: Celene Squibb, MD as PCP - General (Internal Medicine)  Extended Emergency Contact Information Primary Emergency Contact: Kresse,Patricia H Address: [address]          [place], [place] [address] Johnnette Litter of [place] Phone: [phone] Mobile Phone: [phone] Relation: Spouse Secondary Emergency Contact: Loreli [place] of [place] [employer] Phone: [phone] Relation: Sister  Code Status: DNR Goals of Care: Advanced Directive information Advanced Directives 02/07/2018  Does Patient Have a Medical Advance Directive? Yes  Type of Advance Directive Out of facility DNR (pink MOST or yellow form)  Does patient want to make changes to medical advance directive? No - Patient declined  Copy of [place] in Chart? No - copy requested  Would patient like information on creating a medical advance directive? -  Pre-existing out of facility DNR order (yellow form or pink MOST form) -      Chief Complaint  Patient presents with  . New Admit To SNF    New Admission Visit    HPI: Patient is a 72 y.o. male seen today for admission to SNF.  After undergoing uncomplicated left total knee arthroplasty on 02/05 Patient has a history of hypertension, Parkinson disease, status post multiple surgeries of his left knee, recent spinal fusion.   He was admitted electively in the hospital for left TKA.  For unremitting pain in his left knee. Uncomplicated postop and is now in SNF for therapy.  His main complaints today was severe constipation.  He denies any nausea or vomiting.  He did eat his breakfast.  And is passing gas. Denies any abdominal pain.  He did get enema over the weekend with not much result. He also is having severe pain in his left knee especially after therapy. Patient lives with his wife  and was independent before the surgery.  Lives in a 1 level house and is planning to go home.  Past Medical History:  Diagnosis Date  . Anxiety    takes Valium as needed  . Arthritis   . Bladder cancer ([place])    takes Rapaflo daily  . Blood dyscrasia    07/08/16: pt told he was a "free bleeder" after bleeding a lot when derm cut off mole on forehead. No excessive bleeding with minor wounds at home, no bleeding problems perioperatively with prior surgeries.  . Chronic back pain    spondylolisthesis  . Cluster headaches   . Depression    takes Lexapro daily  . Essential hypertension, benign    takes Diovan-HCT daily  . Hx of complications due to general anesthesia    Aborted surgery 07/15/2016 due to hypotension per pt  . Joint pain   . Obstructive sleep apnea on CPAP    uses CPAP @ night  . Pneumonia    "several times"--last time about 3-4 yrs ago  . Prediabetes    Past Surgical History:  Procedure Laterality Date  . ANTERIOR CERVICAL DECOMP/DISCECTOMY FUSION N/A 06/05/2013   Procedure:  C3-4 Anterior Cervical Discectomy and Fusion, Allograft, Plate;  Surgeon: [name], MD;  Location: [facility];  Service: Orthopedics;  Laterality: N/A;  C3-4 Anterior Cervical Discectomy and Fusion, Allograft, Plate  . Arthroscopic knee surgery    . BACK SURGERY      x 2  . BLADDER SURGERY  x 5 to remove tumor  . CARPAL TUNNEL RELEASE    . CATARACT EXTRACTION W/PHACO  12/19/2012   Procedure: CATARACT EXTRACTION PHACO AND INTRAOCULAR LENS PLACEMENT (IOC);  Surgeon: [name], MD;  Location: [facility];  Service: Ophthalmology;  Laterality: Left;  CDE: 26.80  . CATARACT EXTRACTION W/PHACO  01/09/2013   Procedure: CATARACT EXTRACTION PHACO AND INTRAOCULAR LENS PLACEMENT (IOC);  Surgeon: [name], MD;  Location: [facility];  Service: Ophthalmology;  Laterality: Right;  CDE:22.83  . CERVICAL FUSION  06/05/2013   C 3  C4  . CYSTOSCOPY    . REFRACTIVE SURGERY     Hx: of  . RETINAL DETACHMENT SURGERY    .  TOTAL KNEE ARTHROPLASTY Left 02/01/2018   Procedure: TOTAL KNEE ARTHROPLASTY;  Surgeon: Carole Civil, MD;  Location: [facility];  Service: Orthopedics;  Laterality: Left;  . TRANSURETHRAL RESECTION OF BLADDER TUMOR Right 07/27/2015   Procedure: TRANSURETHRAL RESECTION OF BLADDER TUMOR (TURBT);  Surgeon: [name], MD;  Location: [facility];  Service: Urology;  Laterality: Right;  . wisdom tooth extraction      reports that he quit smoking about 41 years ago. he has never used smokeless tobacco. He reports that he does not drink alcohol or use drugs. Social History   Socioeconomic History  . Marital status: Married    Spouse name: [name]   . Number of children: 0  . Years of education: 86  . Highest education level: Not on file  Social Needs  . Financial resource strain: Not on file  . Food insecurity - worry: Not on file  . Food insecurity - inability: Not on file  . Transportation needs - medical: Not on file  . Transportation needs - non-medical: Not on file  Occupational History  . Occupation: Retired    Fish farm manager: UNEMPLOYED  Tobacco Use  . Smoking status: Former Smoker    Last attempt to quit: 12/30/1976    Years since quitting: 41.1  . Smokeless tobacco: Never Used  . Tobacco comment: 1985  Substance and Sexual Activity  . Alcohol use: No    Comment: quit 1990  . Drug use: No  . Sexual activity: Yes  Other Topics Concern  . Not on file  Social History Narrative   Lives with wife    caffeine- sodas    Functional Status Survey:    Family History  Problem Relation Age of Onset  . Hypertension Father   . Cancer Father        Prostate  . Depression Mother   . Hypertension Mother   . Alcohol abuse Mother   . COPD Mother        passed 07-08-17  . Hypertension Sister   . Anxiety disorder Sister   . Alcohol abuse Maternal Grandfather   . ADD / ADHD Neg Hx   . Bipolar disorder Neg Hx   . Dementia Neg Hx   . Drug abuse Neg Hx   . OCD Neg Hx   . Paranoid  behavior Neg Hx   . Schizophrenia Neg Hx   . Seizures Neg Hx   . Sexual abuse Neg Hx   . Physical abuse Neg Hx     Health Maintenance  Topic Date Due  . INFLUENZA VACCINE  03/07/2018 (Originally 07/28/2017)  . COLONOSCOPY  03/07/2018 (Originally 11/18/1996)  . TETANUS/TDAP  03/07/2018 (Originally 11/18/1965)  . Hepatitis C Screening  03/07/2018 (Originally 04-12-1946)  . PNA vac Low Risk Adult (1 of 2 - PCV13) 03/07/2018 (Originally  11/19/2011)    Allergies  Allergen Reactions  . Sulfonamide Derivatives Itching, Rash and Other (See Comments)    welts    Allergies as of 02/07/2018      Reactions   Sulfonamide Derivatives Itching, Rash, Other (See Comments)   welts      Medication List        Accurate as of 02/07/18  9:27 AM. Always use your most recent med list.          acetaminophen 500 MG tablet Commonly known as:  TYLENOL Take 500-1,000 mg by mouth every 6 (six) hours as needed (for pain.).   aspirin 81 MG chewable tablet Chew 1 tablet (81 mg total) by mouth 2 (two) times daily.   bisacodyl 10 MG suppository Commonly known as:  DULCOLAX Place 1 suppository (10 mg total) rectally daily as needed for moderate constipation.   carbidopa-levodopa 25-100 MG tablet Commonly known as:  SINEMET IR Take 1 tablet by mouth 3 (three) times daily before meals.   carboxymethylcellulose 0.5 % Soln Commonly known as:  REFRESH PLUS Place 1 drop into both eyes 3 (three) times daily as needed (for dry eyes.).   diazepam 5 MG tablet Commonly known as:  VALIUM Take 1 tablet (5 mg total) by mouth 2 (two) times daily as needed. for anxiety   docusate sodium 100 MG capsule Commonly known as:  COLACE Take 1 capsule (100 mg total) by mouth 2 (two) times daily.   DULoxetine 60 MG capsule Commonly known as:  CYMBALTA TAKE 1 CAPSULE(60 MG) BY MOUTH DAILY   fluticasone 50 MCG/ACT nasal spray Commonly known as:  FLONASE Place 1-2 sprays into both nostrils daily as needed (for  allergies.).   ibuprofen 200 MG tablet Commonly known as:  ADVIL,MOTRIN Take 400 mg by mouth every 8 (eight) hours as needed (for pain.).   methocarbamol 500 MG tablet Commonly known as:  ROBAXIN Take 1 tablet (500 mg total) by mouth every 6 (six) hours as needed for muscle spasms.   oxyCODONE-acetaminophen 5-325 MG tablet Commonly known as:  PERCOCET/ROXICET Take 1 tablet by mouth every 4 (four) hours as needed for severe pain.   valsartan-hydrochlorothiazide 320-12.5 MG tablet Commonly known as:  DIOVAN-HCT Take 1 tablet by mouth daily.       Review of Systems  Review of Systems  Constitutional: Negative for activity change, appetite change, chills, diaphoresis, fatigue and fever.  HENT: Negative for mouth sores, postnasal drip, rhinorrhea, sinus pain and sore throat.   Respiratory: Negative for apnea, cough, chest tightness, shortness of breath and wheezing.   Cardiovascular: Negative for chest pain, palpitations and leg swelling.  Gastrointestinal: Negative for abdominal distention, abdominal pain, diarrhea, nausea and vomiting.  Genitourinary: Negative for dysuria and frequency.  Musculoskeletal: Negative for arthralgias, joint swelling and myalgias.  Skin: Negative for rash.  Neurological: Negative for dizziness, syncope, weakness, light-headedness and numbness.  Psychiatric/Behavioral: Negative for behavioral problems, confusion and sleep disturbance.     Vitals:   02/07/18 0919  BP: (!) 144/72  Pulse: 89  Resp: 20  Temp: 97.6 F (36.4 C)  TempSrc: Oral   There is no height or weight on file to calculate BMI. Physical Exam  Constitutional: He is oriented to person, place, and time. He appears well-developed and well-nourished.  HENT:  Head: Normocephalic.  Mouth/Throat: Oropharynx is clear and moist.  Eyes: Pupils are equal, round, and reactive to light.  Neck: Neck supple.  Cardiovascular: Normal rate, regular rhythm and normal heart sounds.  No  murmur  heard. Pulmonary/Chest: Effort normal and breath sounds normal. No respiratory distress. He has no wheezes. He has no rales.  Abdominal: Soft. Bowel sounds are normal. He exhibits no distension. There is no tenderness. There is no rebound.  Musculoskeletal: He exhibits no edema.  Lymphadenopathy:    He has no cervical adenopathy.  Neurological: He is alert and oriented to person, place, and time.  No focal deficit.  Good strength in all extremity.  Except the left lower extremity  Skin: Skin is warm and dry.  Psychiatric: He has a normal mood and affect. His behavior is normal. Thought content normal.    Labs reviewed: Basic Metabolic Panel: Recent Labs    01/27/18 1352 02/02/18 0522 02/05/18 0800  NA 141 137 139  K 3.4* 3.5 4.0  CL 104 98* 98*  CO2 26 27 30   GLUCOSE 99 139* 89  BUN 17 12 19   CREATININE 1.08 1.21 1.10  CALCIUM 9.0 8.1* 8.5*   Liver Function Tests: Recent Labs    01/27/18 1352  AST 20  ALT 8*  ALKPHOS 85  BILITOT 0.7  PROT 7.2  ALBUMIN 3.6   No results for input(s): LIPASE, AMYLASE in the last 8760 hours. No results for input(s): AMMONIA in the last 8760 hours. CBC: Recent Labs    01/27/18 1352  02/03/18 0403 02/04/18 0643 02/05/18 0800  WBC 8.2   < > 16.4* 12.8* 11.0*  NEUTROABS 5.1  --   --   --  7.6  HGB 15.0   < > 12.2* 12.1* 11.9*  HCT 46.8   < > 37.1* 37.2* 36.6*  MCV 91.6   < > 90.0 90.7 91.7  PLT 213   < > 188 210 229   < > = values in this interval not displayed.   Cardiac Enzymes: No results for input(s): CKTOTAL, CKMB, CKMBINDEX, TROPONINI in the last 8760 hours. BNP: Invalid input(s): POCBNP Lab Results  Component Value Date   HGBA1C 5.8 (H) 07/22/2016   No results found for: TSH No results found for: VITAMINB12 No results found for: FOLATE No results found for: IRON, TIBC, FERRITIN  Imaging and Procedures obtained prior to SNF admission: No results found.  Assessment/Plan status post left knee arthroplasty On  aspirin twice daily Will increase his Norco to 2 tablets q.4 PRN Continue on Robaxin and ibuprofen Follow-up with ortho Hypertension Continue on valsartan/HCT  Parkinson disease Continue on Sinemet Follows with neurology Mild leukocytosis White count is coming down  Repeat CBC in few days Depression with anxiety Patient on Valium and Cymbalta as outpatient Constipation Started on MiraLAX  Disposition Patient planning to go home with his wife   Family/ staff Communication:   Labs/tests ordered: BMP, CBC in 1 week. Total time spent in this patient care encounter was 45_ minutes; greater than 50% of the visit spent counseling patient, reviewing records , Labs and coordinating care for problems addressed at this encounter.

## 2018-02-07 NOTE — Telephone Encounter (Signed)
RX Fax for [facility]@ [phone]  

## 2018-02-11 ENCOUNTER — Other Ambulatory Visit: Payer: Self-pay | Admitting: *Deleted

## 2018-02-11 NOTE — Patient Outreach (Signed)
[facility]) Care Management  02/11/2018  Zachary [name] [facility] Apr 23, 1946 [id number]   Met with Kristin Bruins, SW at facility.  She reports patient doing well. Has a good discharge plan when ready.   Met with patient at bedside. Patient reports he chose to come to facility due to wife being too ill to assist him. He states he is getting a good workout and doing well with therapy but continues to have pain management issues.   RNCM reviewed [facility] care management services. He does feel any [facility] Care management needs at this time. He was independent prior to admission and should be back to independence at discharge.  No issues with transportation or medication management.   Plan to sign off, Royetta Crochet. Laymond Purser, RN, BSN, [place] [phone]) Business Cell  [phone]) Toll Free Office

## 2018-02-14 ENCOUNTER — Other Ambulatory Visit: Payer: Self-pay

## 2018-02-14 ENCOUNTER — Encounter (HOSPITAL_COMMUNITY)
Admission: AD | Admit: 2018-02-14 | Discharge: 2018-02-14 | Disposition: A | Discharge status: Home / Self Care | Payer: PPO | Source: Skilled Nursing Facility

## 2018-02-14 LAB — CBC
HEMATOCRIT: 41.6 % (ref 39.0–52.0)
Hemoglobin: 13.2 g/dL (ref 13.0–17.0)
MCH: 28.9 pg (ref 26.0–34.0)
MCHC: 31.7 g/dL (ref 30.0–36.0)
MCV: 91.2 fL (ref 78.0–100.0)
PLATELETS: 357 10*3/uL (ref 150–400)
RBC: 4.56 MIL/uL (ref 4.22–5.81)
RDW: 13.2 % (ref 11.5–15.5)
WBC: 11.5 10*3/uL — AB (ref 4.0–10.5)

## 2018-02-14 LAB — BASIC METABOLIC PANEL
Anion gap: 12 (ref 5–15)
BUN: 25 mg/dL — AB (ref 6–20)
CO2: 25 mmol/L (ref 22–32)
CREATININE: 1.19 mg/dL (ref 0.61–1.24)
Calcium: 9.2 mg/dL (ref 8.9–10.3)
Chloride: 99 mmol/L — ABNORMAL LOW (ref 101–111)
GFR calc Af Amer: >60 mL/min (ref >60)
GFR, EST NON AFRICAN AMERICAN: 60 mL/min — AB (ref >60)
GLUCOSE: 100 mg/dL — AB (ref 65–99)
POTASSIUM: 4.8 mmol/L (ref 3.5–5.1)
SODIUM: 136 mmol/L (ref 135–145)

## 2018-02-14 MED ORDER — HYDROCODONE-ACETAMINOPHEN 5-325 MG PO TABS: 1.0000 | ORAL_TABLET | ORAL | 0 refills | Status: DC | PRN

## 2018-02-14 NOTE — Telephone Encounter (Signed)
RX Fax for [facility]@ [phone]  

## 2018-02-16 ENCOUNTER — Encounter: Payer: Self-pay | Admitting: Internal Medicine

## 2018-02-16 ENCOUNTER — Non-Acute Institutional Stay (SKILLED_NURSING_FACILITY): Payer: PPO | Admitting: Internal Medicine

## 2018-02-16 ENCOUNTER — Ambulatory Visit (INDEPENDENT_AMBULATORY_CARE_PROVIDER_SITE_OTHER): Payer: PPO | Admitting: Orthopedic Surgery

## 2018-02-16 ENCOUNTER — Encounter: Payer: Self-pay | Admitting: Orthopedic Surgery

## 2018-02-16 VITALS — BP 101/65 | HR 117 | Ht 72.0 in | Wt 234.0 lb

## 2018-02-16 DIAGNOSIS — Z96652 Presence of left artificial knee joint: Secondary | ICD-10-CM

## 2018-02-16 DIAGNOSIS — F32A Depression, unspecified: Secondary | ICD-10-CM

## 2018-02-16 DIAGNOSIS — G20 Parkinson's disease: Secondary | ICD-10-CM | POA: Diagnosis not present

## 2018-02-16 DIAGNOSIS — I10 Essential (primary) hypertension: Secondary | ICD-10-CM | POA: Diagnosis not present

## 2018-02-16 DIAGNOSIS — F329 Major depressive disorder, single episode, unspecified: Secondary | ICD-10-CM

## 2018-02-16 NOTE — Progress Notes (Signed)
This is apre-- discharge note.  Level of care skilled.  Facility is [facility].  Chief complaint -pre-discharge note  .  History of present illness  Patient is a pleasant 72 year old male who is here for rehab after undergoing an uncomplicated left total knee arthroplasty earlier this month.  Has a history of  hypertension as well as Parkinson's disease and recent spinal fusion.  He had an elective admission for left TKA.  Postop course was uncomplicated and he has had a short course of therapy here in skilled nursing.  At this point he still has some pain but apparently the Norco is helping as well as Robaxin as needed and ibuprofen.  He did see his orthopedic surgeon Dr. Aline Brochure today and thought to be doing well with follow-up in 4 weeks.  Initially complained of some constipation but apparently this has improved he is on Colace  routinely as well as as needed MiraLAX and Bisacodyl    His other medical issues appear to have been stable blood pressure appears to be well controlled on losartan hydrochlorothiazide combination with systolics largely in the 270W.  He also continues on Sinemet for Parkinson's disease which appears to be well controlled.  He also has a history of depression with anxiety is on Cymbalta routinely and diazepam as needed this does not appear to have been a significant issue during his stay here.  He will be going home with his wife  later this week and will need continued PT and OT-he is weightbearing as tolerated         Past Medical History:  Diagnosis Date  . Anxiety    takes Valium as needed  . Arthritis   . Bladder cancer ([place])    takes Rapaflo daily  . Blood dyscrasia    07/08/16: pt told he was a "free bleeder" after bleeding a lot when derm cut off mole on forehead. No excessive bleeding with minor wounds at home, no bleeding problems perioperatively with prior surgeries.  . Chronic back pain    spondylolisthesis  .  Cluster headaches   . Depression    takes Lexapro daily  . Essential hypertension, benign    takes Diovan-HCT daily  . Hx of complications due to general anesthesia    Aborted surgery 07/15/2016 due to hypotension per pt  . Joint pain   . Obstructive sleep apnea on CPAP    uses CPAP @ night  . Pneumonia    "several times"--last time about 3-4 yrs ago  . Prediabetes         Past Surgical History:  Procedure Laterality Date  . ANTERIOR CERVICAL DECOMP/DISCECTOMY FUSION N/A 06/05/2013   Procedure:  C3-4 Anterior Cervical Discectomy and Fusion, Allograft, Plate;  Surgeon: [name], MD;  Location: [facility];  Service: Orthopedics;  Laterality: N/A;  C3-4 Anterior Cervical Discectomy and Fusion, Allograft, Plate  . Arthroscopic knee surgery    . BACK SURGERY      x 2  . BLADDER SURGERY     x 5 to remove tumor  . CARPAL TUNNEL RELEASE    . CATARACT EXTRACTION W/PHACO  12/19/2012   Procedure: CATARACT EXTRACTION PHACO AND INTRAOCULAR LENS PLACEMENT (IOC);  Surgeon: [name], MD;  Location: [facility];  Service: Ophthalmology;  Laterality: Left;  CDE: 26.80  . CATARACT EXTRACTION W/PHACO  01/09/2013   Procedure: CATARACT EXTRACTION PHACO AND INTRAOCULAR LENS PLACEMENT (IOC);  Surgeon: [name], MD;  Location: [facility];  Service: Ophthalmology;  Laterality: Right;  IRW:43.15  . CERVICAL FUSION  06/05/2013   C 3  C4  . CYSTOSCOPY    . REFRACTIVE SURGERY     Hx: of  . RETINAL DETACHMENT SURGERY    . TOTAL KNEE ARTHROPLASTY Left 02/01/2018   Procedure: TOTAL KNEE ARTHROPLASTY;  Surgeon: Carole Civil, MD;  [facility];  Service: Orthopedics;  Laterality: Left;  . TRANSURETHRAL RESECTION OF BLADDER TUMOR Right 07/27/2015   Procedure: TRANSURETHRAL RESECTION OF BLADDER TUMOR (TURBT);  Surgeon: [name], MD;  [facility];  Service: Urology;  Laterality: Right;  . wisdom tooth extraction      reports that he quit smoking about 41  years ago. he has never used smokeless tobacco. He reports that he does not drink alcohol or use drugs. Social History        Socioeconomic History  . Marital status: Married    Spouse name: [name]   . Number of children: 0  . Years of education: 3  . Highest education level: Not on file  Social Needs  . Financial resource strain: Not on file  . Food insecurity - worry: Not on file  . Food insecurity - inability: Not on file  . Transportation needs - medical: Not on file  . Transportation needs - non-medical: Not on file  Occupational History  . Occupation: Retired    Fish farm manager: UNEMPLOYED  Tobacco Use  . Smoking status: Former Smoker    Last attempt to quit: 12/30/1976    Years since quitting: 41.1  . Smokeless tobacco: Never Used  . Tobacco comment: 1985  Substance and Sexual Activity  . Alcohol use: No    Comment: quit 1990  . Drug use: No  . Sexual activity: Yes  Other Topics Concern  . Not on file  Social History Narrative   Lives with wife    caffeine- sodas    Functional Status Survey:       Family History  Problem Relation Age of Onset  . Hypertension Father   . Cancer Father        Prostate  . Depression Mother   . Hypertension Mother   . Alcohol abuse Mother   . COPD Mother        passed 07-08-17  . Hypertension Sister   . Anxiety disorder Sister   . Alcohol abuse Maternal Grandfather   . ADD / ADHD Neg Hx   . Bipolar disorder Neg Hx   . Dementia Neg Hx   . Drug abuse Neg Hx   . OCD Neg Hx   . Paranoid behavior Neg Hx   . Schizophrenia Neg Hx   . Seizures Neg Hx   . Sexual abuse Neg Hx   . Physical abuse Neg Hx         Health Maintenance  Topic Date Due  . INFLUENZA VACCINE  03/07/2018 (Originally 07/28/2017)  . COLONOSCOPY  03/07/2018 (Originally 11/18/1996)  . TETANUS/TDAP  03/07/2018 (Originally 11/18/1965)  . Hepatitis C Screening  03/07/2018 (Originally 1946/03/09)  . PNA vac Low Risk Adult (1 of 2 -  PCV13) 03/07/2018 (Originally 11/19/2011)         Allergies  Allergen Reactions  . Sulfonamide Derivatives Itching, Rash and Other (See Comments)    welts        Allergies as of 02/07/2018      Reactions   Sulfonamide Derivatives Itching, Rash, Other (See Comments)   welts  Medication List                      acetaminophen 500 MG tablet Commonly known as:  TYLENOL Take 500-1,000 mg by mouth every 6 (six) hours as needed (for pain.).   aspirin 81 MG chewable tablet Chew 1 tablet (81 mg total) by mouth 2 (two) times daily.   bisacodyl 10 MG suppository Commonly known as:  DULCOLAX Place 1 suppository (10 mg total) rectally daily as needed for moderate constipation.   carbidopa-levodopa 25-100 MG tablet Commonly known as:  SINEMET IR Take 1 tablet by mouth 3 (three) times daily before meals.   carboxymethylcellulose 0.5 % Soln Commonly known as:  REFRESH PLUS Place 1 drop into both eyes 3 (three) times daily as needed (for dry eyes.).   diazepam 5 MG tablet Commonly known as:  VALIUM Take 1 tablet (5 mg total) by mouth 2 (two) times daily as needed. for anxiety   docusate sodium 100 MG capsule Commonly known as:  COLACE Take 1 capsule (100 mg total) by mouth 2 (two) times daily.   DULoxetine 60 MG capsule Commonly known as:  CYMBALTA TAKE 1 CAPSULE(60 MG) BY MOUTH DAILY   fluticasone 50 MCG/ACT nasal spray Commonly known as:  FLONASE Place 1-2 sprays into both nostrils daily as needed (for allergies.).   ibuprofen 200 MG tablet Commonly known as:  ADVIL,MOTRIN Take 400 mg by mouth every 8 (eight) hours as needed (for pain.).   methocarbamol 500 MG tablet Commonly known as:  ROBAXIN Take 1 tablet (500 mg total) by mouth every 6 (six) hours as needed for muscle spasms.      valsartan-hydrochlorothiazide 320-12.5 MG tablet Commonly known as:  DIOVAN-HCT Take 1 tablet by mouth daily.       He is  also on Norco %-325 mg   Two tabs po Q 4 hours prn   Review of systems.  In general is not complaining of any fever chills.  Skin is not complain of rashes or itching surgical site currently covered has been evaluated by orthopedics and thought to be stable.  Head ears eyes nose mouth and throat does not complain of visual changes has prescription lenses does not complain of sore throat.  Respiratory does not complain of shortness of breath or cough.  Cardiac denies chest pain does not really have significant lower extremity edema.  GI is not complaining of abdominal pain nausea vomiting or at this point constipation.  GU does not complain of dysuria.  Musculoskeletal continues to have some knee discomfort but is ambulatory in a walker.  Neurologic does not complain of dizziness headache or numbness.  And psych does have history of depression and anxiety this appears to be well controlled he does not complain of that this evening  Physical exam.  He is afebrile pulse is 96 respirations 20 blood pressure 130/85-139/72.  In general this is a pleasant elderly male in no distress--he is well-developed well-nourished.  His skin is warm and dry..  Eyes visual acuity appears to be intact he has prescription lenses.  Oropharynx clear mucous membranes moist  . Chest is clear to auscultation there is no labored breathing   Heart is regular rate and rhythm without murmur gallop or rub he does not have significant lower extremity edema pedal pulses appear to be intact.  Abdomen is soft nontender with active bowel sounds.  Musculoskeletal is able to move all extremities x4 he does have dressing over the left knee  surgical site I do not really see any surrounding erythema or significantly increased edema-he is ambulatory with a walker-.  Neurologic is grossly intact no lateralizing findings his speech is clear.  Psych he is alert and oriented pleasant and appropriate     labs.  February 14, 2018.  WBC 11.5 hemoglobin 13.2 platelets 357.  Sodium 136 potassium 4.8 BUN 25 creatinine 1.19.    Assessment and plan.  History of left total knee replacement-he was seen by orthopedics today and thought to be doing well with follow-up in 4 weeks-continues PT and OT will need this at home-he is on as needed Norco as well as Robaxin and ibuprofen for pain He continues on aspirin low-dose for anticoagulation.  2.-History of hypertension as noted above this appears stable on the valsartan hydrochlorothiazide combination.  3.  History of Parkinson's disease this appears under good control on Sinemet.  4.  History of constipation he is on Colace routinely also on MiraLAX as needed as well as  Bisacodyl--is not complaining of constipation this evening.  6.  History of depression continues on Cymbalta he also has diazepam as needed for anxiety this is been stable during his stay here.  #7 leukocytosis This appears to be borderline elevated he is not symptomatic of any respiratory or urinary issues he has been afebrile this appears somewhat chronic per chart review  Again he will need continued PT and OT as well as orthopedic follow-up will be going home with his spouse-he currently is ambulating with a walker and is weightbearing as tolerated.  He will need expedient follow-up by his primary care provider as well.  Once he is discharged    [phone]

## 2018-02-16 NOTE — Progress Notes (Deleted)
Location:   [facility] Room Number: 131/P Place of Service:  SNF (31) Provider: Cory Roughen, MD  Patient Care Team: Celene Squibb, MD as PCP - General (Internal Medicine)  Extended Emergency Contact Information Primary Emergency Contact: Lallier,Patricia H Address: [address]          [place], [place] [address] Johnnette Litter of [place] Phone: [phone] Mobile Phone: [phone] Relation: Spouse Secondary Emergency Contact: Loreli Dollar [place] of [place] Phone: [phone] Relation: Sister  Code Status:  *** Goals of care: Advanced Directive information Advanced Directives 02/16/2018  Does Patient Have a Medical Advance Directive? Yes  Type of Advance Directive (No Data)  Does patient want to make changes to medical advance directive? No - Patient declined  Copy of [place] in Chart? No - copy requested  Would patient like information on creating a medical advance directive? -  Pre-existing out of facility DNR order (yellow form or pink MOST form) -     Chief Complaint  Patient presents with  . Discharge Note    Discharge Visit    HPI:  Pt is a 72 y.o. male seen today for medical management of chronic diseases.     Past Medical History:  Diagnosis Date  . Anxiety    takes Valium as needed  . Arthritis   . Bladder cancer ([place])    takes Rapaflo daily  . Blood dyscrasia    07/08/16: pt told he was a "free bleeder" after bleeding a lot when derm cut off mole on forehead. No excessive bleeding with minor wounds at home, no bleeding problems perioperatively with prior surgeries.  . Chronic back pain    spondylolisthesis  . Cluster headaches   . Depression    takes Lexapro daily  . Essential hypertension, benign    takes Diovan-HCT daily  . Hx of complications due to general anesthesia    Aborted surgery 07/15/2016 due to hypotension per pt  . Joint pain   . Obstructive sleep apnea on CPAP     uses CPAP @ night  . Pneumonia    "several times"--last time about 3-4 yrs ago  . Prediabetes    Past Surgical History:  Procedure Laterality Date  . ANTERIOR CERVICAL DECOMP/DISCECTOMY FUSION N/A 06/05/2013   Procedure:  C3-4 Anterior Cervical Discectomy and Fusion, Allograft, Plate;  Surgeon: [name], MD;  Location: [facility];  Service: Orthopedics;  Laterality: N/A;  C3-4 Anterior Cervical Discectomy and Fusion, Allograft, Plate  . Arthroscopic knee surgery    . BACK SURGERY      x 2  . BLADDER SURGERY     x 5 to remove tumor  . CARPAL TUNNEL RELEASE    . CATARACT EXTRACTION W/PHACO  12/19/2012   Procedure: CATARACT EXTRACTION PHACO AND INTRAOCULAR LENS PLACEMENT (IOC);  Surgeon: [name], MD;  Location: [facility];  Service: Ophthalmology;  Laterality: Left;  CDE: 26.80  . CATARACT EXTRACTION W/PHACO  01/09/2013   Procedure: CATARACT EXTRACTION PHACO AND INTRAOCULAR LENS PLACEMENT (IOC);  Surgeon: [name], MD;  Location: [facility];  Service: Ophthalmology;  Laterality: Right;  CDE:22.83  . CERVICAL FUSION  06/05/2013   C 3  C4  . CYSTOSCOPY    . REFRACTIVE SURGERY     Hx: of  . RETINAL DETACHMENT SURGERY    . TOTAL KNEE ARTHROPLASTY Left 02/01/2018   Procedure: TOTAL KNEE ARTHROPLASTY;  Surgeon: Carole Civil, MD;  Location: [facility];  Service: Orthopedics;  Laterality: Left;  . TRANSURETHRAL RESECTION OF BLADDER TUMOR Right 07/27/2015   Procedure: TRANSURETHRAL RESECTION OF BLADDER TUMOR (TURBT);  Surgeon: [name], MD;  Location: [facility];  Service: Urology;  Laterality: Right;  . wisdom tooth extraction      Allergies  Allergen Reactions  . Sulfonamide Derivatives Itching, Rash and Other (See Comments)    welts    Allergies as of 02/16/2018      Reactions   Sulfonamide Derivatives Itching, Rash, Other (See Comments)   welts      Medication List    Notice   This visit is during an admission. Changes to the med list made in this visit will be reflected in the  After Visit Summary of the admission.     Review of Systems  Immunization History  Administered Date(s) Administered  . Influenza-Unspecified 07/28/2014, 09/28/2015   Pertinent  Health Maintenance Due  Topic Date Due  . INFLUENZA VACCINE  03/07/2018 (Originally 07/28/2017)  . COLONOSCOPY  03/07/2018 (Originally 11/18/1996)  . PNA vac Low Risk Adult (1 of 2 - PCV13) 03/07/2018 (Originally 11/19/2011)   Fall Risk  01/15/2017  Falls in the past year? Yes  Number falls in past yr: 1  Injury with Fall? No  Risk for fall due to : Other (Comment)  Risk for fall due to: Comment "stumble easily"   Functional Status Survey:    Vitals:   02/16/18 1615  BP: 130/84  Pulse: 86  Resp: 20  Temp: (!) 97 F (36.1 C)  TempSrc: Oral  SpO2: 96%   There is no height or weight on file to calculate BMI. Physical Exam  Labs reviewed: Recent Labs    02/02/18 0522 02/05/18 0800 02/14/18 0035  NA 137 139 136  K 3.5 4.0 4.8  CL 98* 98* 99*  CO2 27 30 25   GLUCOSE 139* 89 100*  BUN 12 19 25*  CREATININE 1.21 1.10 1.19  CALCIUM 8.1* 8.5* 9.2   Recent Labs    01/27/18 1352  AST 20  ALT 8*  ALKPHOS 85  BILITOT 0.7  PROT 7.2  ALBUMIN 3.6   Recent Labs    01/27/18 1352  02/04/18 0643 02/05/18 0800 02/14/18 0035  WBC 8.2   < > 12.8* 11.0* 11.5*  NEUTROABS 5.1  --   --  7.6  --   HGB 15.0   < > 12.1* 11.9* 13.2  HCT 46.8   < > 37.2* 36.6* 41.6  MCV 91.6   < > 90.7 91.7 91.2  PLT 213   < > 210 229 357   < > = values in this interval not displayed.   No results found for: TSH Lab Results  Component Value Date   HGBA1C 5.8 (H) 07/22/2016   No results found for: CHOL, HDL, LDLCALC, LDLDIRECT, TRIG, CHOLHDL  Significant Diagnostic Results in last 30 days:  Dg Knee Left Port  Result Date: 02/01/2018 CLINICAL DATA:  Total knee replacement EXAM: PORTABLE LEFT KNEE - 1-2 VIEW COMPARISON:  12/15/2017 FINDINGS: Total knee replacement in satisfactory position and alignment. No  immediate complication. Gas and drain in the knee joint. Arterial calcification IMPRESSION: Satisfactory total knee replacement. Electronically Signed   By: Franchot Gallo M.D.   On: 02/01/2018 10:15    Assessment/Plan There are no diagnoses linked to this encounter.   Family/ staff Communication: ***  Labs/tests ordered:  ***    This encounter was created in error - please disregard.

## 2018-02-16 NOTE — Progress Notes (Signed)
Patient ID: Zachary [name], male   DOB: 04/15/1946, 72 y.o.   MRN: [id number]  Chief Complaint  Patient presents with  . Post-op Follow-up    left knee TKR 02/01/18    HPI Zachary [name] is a 72 y.o. male.  Postop day 15   Allergies  Allergen Reactions  . Sulfonamide Derivatives Itching, Rash and Other (See Comments)    welts    Current Outpatient Medications  Medication Sig Dispense Refill  . HYDROcodone-acetaminophen (NORCO/VICODIN) 5-325 MG tablet Take 1 tablet by mouth every 4 (four) hours as needed for moderate pain. Take x2 weeks 30 tablet 0   No current facility-administered medications for this visit.       Physical Exam Physical Exam Blood pressure 101/65, pulse (!) 117, height 6' (1.829 m), weight 234 lb (106.1 kg).  Appearance of incision: Incision is clean dry and intact we removed the staples  The calf was supple and the Homans sign was normal, there is minimal peripheral edema  Assessment and plan The patient is doing well and is in good condition  Follow-up will be 4 weeks   2:15 PM Arther Abbott, MD 02/16/2018

## 2018-02-16 NOTE — Progress Notes (Signed)
This encounter was created in error - please disregard.

## 2018-02-18 ENCOUNTER — Encounter: Payer: Self-pay | Admitting: Internal Medicine

## 2018-02-18 ENCOUNTER — Encounter (HOSPITAL_COMMUNITY)
Admission: RE | Admit: 2018-02-18 | Discharge: 2018-02-18 | Disposition: A | Discharge status: Home / Self Care | Payer: PPO | Source: Skilled Nursing Facility | Attending: Internal Medicine | Admitting: Internal Medicine

## 2018-02-18 ENCOUNTER — Non-Acute Institutional Stay (SKILLED_NURSING_FACILITY): Payer: PPO | Admitting: Internal Medicine

## 2018-02-18 DIAGNOSIS — R000 Tachycardia, unspecified: Secondary | ICD-10-CM | POA: Diagnosis not present

## 2018-02-18 DIAGNOSIS — G20 Parkinson's disease: Secondary | ICD-10-CM

## 2018-02-18 DIAGNOSIS — I10 Essential (primary) hypertension: Secondary | ICD-10-CM

## 2018-02-18 DIAGNOSIS — R42 Dizziness and giddiness: Secondary | ICD-10-CM | POA: Diagnosis not present

## 2018-02-18 DIAGNOSIS — Z96652 Presence of left artificial knee joint: Secondary | ICD-10-CM | POA: Diagnosis not present

## 2018-02-18 DIAGNOSIS — Z96651 Presence of right artificial knee joint: Secondary | ICD-10-CM | POA: Diagnosis not present

## 2018-02-18 LAB — COMPREHENSIVE METABOLIC PANEL
ALBUMIN: 4 g/dL (ref 3.5–5.0)
ALT: 14 U/L — AB (ref 17–63)
AST: 24 U/L (ref 15–41)
Alkaline Phosphatase: 103 U/L (ref 38–126)
Anion gap: 11 (ref 5–15)
BUN: 28 mg/dL — AB (ref 6–20)
CHLORIDE: 99 mmol/L — AB (ref 101–111)
CO2: 28 mmol/L (ref 22–32)
CREATININE: 1.32 mg/dL — AB (ref 0.61–1.24)
Calcium: 9.7 mg/dL (ref 8.9–10.3)
GFR calc non Af Amer: 53 mL/min — ABNORMAL LOW (ref >60)
GLUCOSE: 85 mg/dL (ref 65–99)
Potassium: 5.2 mmol/L — ABNORMAL HIGH (ref 3.5–5.1)
SODIUM: 138 mmol/L (ref 135–145)
Total Bilirubin: 0.6 mg/dL (ref 0.3–1.2)
Total Protein: 7.9 g/dL (ref 6.5–8.1)

## 2018-02-18 LAB — CBC WITH DIFFERENTIAL/PLATELET
BASOS ABS: 0.1 10*3/uL (ref 0.0–0.1)
BASOS PCT: 1 %
EOS ABS: 0.2 10*3/uL (ref 0.0–0.7)
Eosinophils Relative: 2 %
HCT: 46.4 % (ref 39.0–52.0)
Hemoglobin: 14.5 g/dL (ref 13.0–17.0)
Lymphocytes Relative: 20 %
Lymphs Abs: 2 10*3/uL (ref 0.7–4.0)
MCH: 28.9 pg (ref 26.0–34.0)
MCHC: 31.3 g/dL (ref 30.0–36.0)
MCV: 92.4 fL (ref 78.0–100.0)
Monocytes Absolute: 0.6 10*3/uL (ref 0.1–1.0)
Monocytes Relative: 6 %
NEUTROS PCT: 71 %
Neutro Abs: 7.1 10*3/uL (ref 1.7–7.7)
PLATELETS: 405 10*3/uL — AB (ref 150–400)
RBC: 5.02 MIL/uL (ref 4.22–5.81)
RDW: 13.4 % (ref 11.5–15.5)
WBC: 9.8 10*3/uL (ref 4.0–10.5)

## 2018-02-18 NOTE — Progress Notes (Signed)
This is an acute visit.  Level of care skilled.  Facility is [facility] therapist complaint- discharge note-acute visit secondary to tachycardia.  History of present illness.  Patient is a pleasant 72 year old male who is here for rehab after undergoing an uncomplicated left total knee arthroplasty earlier in the month.--- He is slated for discharge later today  Has a history of hypertension Parkinson's disease and recent spinal fusion-he had an elective admission for left TKA which he tolerated well.  And has received a short course of therapy here in skilled nursing.  His stay here has been quite unremarkable--he was seen for  Pre-- discharge 2 days ago- it has been noted over the last couple days since then  however he has had some tachycardia- which may have become a bit more persistent.  Appears per review and speaking with staff at times he has had some tachycardic episodes but these are not persistent.  Yesterday he was noted to be tachycardic- his diazepam was restarted he has been receiving this previously apparently as needed but had not received in some time--however this was restarted apparently yesterday.    His pulse last night did come down to the 80s-however this morning was noted to be around 110-EKG was done which showed sinus tachycardia-.  EKG  was reviewed by Dr.Gupta---he is not complaining of any chest pain or shortness of breath or leg pain- \He is quite adamant he wants to go home today- is 8.6 currently.  Right is a scratch and keep her leg and extend her here before she not be on the phone all week and vital signs are stable his blood pressure is stable at 118/70.  He will be going home with his wife who is very supportive.  Past Medical History:  Diagnosis Date  . Anxiety    takes Valium as needed  . Arthritis   . Bladder cancer ([place])    takes Rapaflo daily  . Blood dyscrasia    07/08/16: pt told he was a "free bleeder" after bleeding a lot  when derm cut off mole on forehead. No excessive bleeding with minor wounds at home, no bleeding problems perioperatively with prior surgeries.  . Chronic back pain    spondylolisthesis  . Cluster headaches   . Depression    takes Lexapro daily  . Essential hypertension, benign    takes Diovan-HCT daily  . Hx of complications due to general anesthesia    Aborted surgery 07/15/2016 due to hypotension per pt  . Joint pain   . Obstructive sleep apnea on CPAP    uses CPAP @ night  . Pneumonia    "several times"--last time about 3-4 yrs ago  . Prediabetes         Past Surgical History:  Procedure Laterality Date  . ANTERIOR CERVICAL DECOMP/DISCECTOMY FUSION N/A 06/05/2013   Procedure: C3-4 Anterior Cervical Discectomy and Fusion, Allograft, Plate; Surgeon: [name], MD; Location: [facility]; Service: Orthopedics; Laterality: N/A; C3-4 Anterior Cervical Discectomy and Fusion, Allograft, Plate  . Arthroscopic knee surgery    . BACK SURGERY     x 2  . BLADDER SURGERY     x 5 to remove tumor  . CARPAL TUNNEL RELEASE    . CATARACT EXTRACTION W/PHACO  12/19/2012   Procedure: CATARACT EXTRACTION PHACO AND INTRAOCULAR LENS PLACEMENT (IOC); Surgeon: [name], MD; Location: [facility]; Service: Ophthalmology; Laterality: Left; CDE: 26.80  . CATARACT EXTRACTION W/PHACO  01/09/2013   Procedure: CATARACT EXTRACTION  PHACO AND INTRAOCULAR LENS PLACEMENT (IOC); Surgeon: [name], MD; Location: [facility]; Service: Ophthalmology; Laterality: Right; CDE:22.83  . CERVICAL FUSION  06/05/2013   C 3 C4  . CYSTOSCOPY    . REFRACTIVE SURGERY     Hx: of  . RETINAL DETACHMENT SURGERY    . TOTAL KNEE ARTHROPLASTY Left 02/01/2018   Procedure: TOTAL KNEE ARTHROPLASTY; Surgeon: Carole Civil, MD; Location: [facility]; Service: Orthopedics; Laterality: Left;  . TRANSURETHRAL RESECTION OF BLADDER TUMOR Right 07/27/2015   Procedure: TRANSURETHRAL RESECTION  OF BLADDER TUMOR (TURBT); Surgeon: [name], MD; Location[facility]; Service: Urology; Laterality: Right;  . wisdom tooth extraction     reports that he quit smoking about 41 years ago. he has never used smokeless tobacco. He reports that he does not drink alcohol or use drugs. Social History        Socioeconomic History  . Marital status: Married    Spouse name: [name]   . Number of children: 0  . Years of education: 65  . Highest education level: Not on file  Social Needs  . Financial resource strain: Not on file  . Food insecurity - worry: Not on file  . Food insecurity - inability: Not on file  . Transportation needs - medical: Not on file  . Transportation needs - non-medical: Not on file  Occupational History  . Occupation: Retired    Fish farm manager: UNEMPLOYED  Tobacco Use  . Smoking status: Former Smoker    Last attempt to quit: 12/30/1976    Years since quitting: 41.1  . Smokeless tobacco: Never Used  . Tobacco comment: 1985  Substance and Sexual Activity  . Alcohol use: No    Comment: quit 1990  . Drug use: No  . Sexual activity: Yes  Other Topics Concern  . Not on file  Social History Narrative   Lives with wife   caffeine- sodas    Functional Status Survey:       Family History  Problem Relation Age of Onset  . Hypertension Father   . Cancer Father    Prostate  . Depression Mother   . Hypertension Mother   . Alcohol abuse Mother   . COPD Mother    passed 07-08-17  . Hypertension Sister   . Anxiety disorder Sister   . Alcohol abuse Maternal Grandfather   . ADD / ADHD Neg Hx   . Bipolar disorder Neg Hx   . Dementia Neg Hx   . Drug abuse Neg Hx   . OCD Neg Hx   . Paranoid behavior Neg Hx   . Schizophrenia Neg Hx   . Seizures Neg Hx   . Sexual abuse Neg Hx   . Physical abuse Neg Hx         Health Maintenance  Topic Date Due  . INFLUENZA VACCINE  03/07/2018 (Originally  07/28/2017)  . COLONOSCOPY  03/07/2018 (Originally 11/18/1996)  . TETANUS/TDAP  03/07/2018 (Originally 11/18/1965)  . Hepatitis C Screening  03/07/2018 (Originally Sep 18, 1946)  . PNA vac Low Risk Adult (1 of 2 - PCV13) 03/07/2018 (Originally 11/19/2011)         Allergies  Allergen Reactions  . Sulfonamide Derivatives Itching, Rash and Other (See Comments)    welts      Morning Current complaint                  Reactions   Sulfonamide Derivatives Itching, Rash, Other (See Comments)   welts  Medication List                               acetaminophen500 MG tablet Commonly known as: TYLENOL Take 500-1,000 mg by mouth every 6 (six) hours as needed (for pain.).     aspirin81 MG chewable tablet Chew 1 tablet (81 mg total) by mouth 2 (two) times daily.     bisacodyl10 MG suppository Commonly known as: DULCOLAX Place 1 suppository (10 mg total) rectally daily as needed for moderate constipation.     carbidopa-levodopa25-100 MG tablet Commonly known as: SINEMET IR Take 1 tablet by mouth 3 (three) times daily before meals.     carboxymethylcellulose0.5 % Soln Commonly known as: REFRESH PLUS Place 1 drop into both eyes 3 (three) times daily as needed (for dry eyes.).     diazepam5 MG tablet Commonly known as: VALIUM Take 1 tablet (5 mg total) by mouth 2 (two) times daily as needed. for anxiety     docusate sodium100 MG capsule Commonly known as: COLACE Take 1 capsule (100 mg total) by mouth 2 (two) times daily.     DULoxetine60 MG capsule Commonly known as: CYMBALTA TAKE 1 CAPSULE(60 MG) BY MOUTH DAILY     fluticasone50 MCG/ACT nasal spray Commonly known as: FLONASE Place 1-2 sprays into both nostrils daily as needed (for allergies.).     ibuprofen200 MG tablet Commonly known as: ADVIL,MOTRIN Take 400 mg by mouth every 8 (eight) hours as needed (for pain.).       methocarbamol500 MG tablet Commonly known as: ROBAXIN Take 1 tablet (500 mg total) by mouth every 6 (six) hours as needed for muscle spasms.          valsartan-hydrochlorothiazide320-12.5 MG tablet Commonly known as: DIOVAN-HCT Take 1 tablet by mouth daily.           He is also on Norco %-325 mg   Two tabs po Q 4 hours prn   Review of systems.  I general is not complaining of any fever chills resting in bed comfortably-somewhat anxious stating he wants to go home  Skin does not complain of any rashes or itching.  Head ears eyes nose mouth and throat does not complain of visual changes or sore throat.  Respiratory denies shortness of breath or cough.  Cardiac is not complaining of any chest pain does not really have significant lower extremity edema.  GI does not complain of abdominal discomfort nausea vomiting diarrhea constipation at this point.  Musculoskeletal at times will complain of knee pain the Norco appears to help otherwise does not really have any pain complaints.--Is not complaining of pain currently  Neurologic does not complain of dizziness headache or numbness or syncope.  Psych does have some history of anxiety again his PRN diazepam has been restarted-he is somewhat anxious today stating he wants to get home .     Physical exam.   He is afebrile pulse initially on exam was 110 on reexam was 102-blood pressure is 118/70 manually- respirations are 18.   Is in general this is a pleasant slightly anxious male in no distress resting comfortably in bed.  His skin is warm and dry is not diaphoretic.  Eyes visual acuity appears grossly intact.  Chest is clear to auscultation there is no labored breathing.  Heart is regular rhythm tachycardic was 110 on initial exam on reexam was 102.  Abdomen is soft nontender with positive bowel sounds.  Musculoskeletal has  a well-healed surgical scar left knee is able to move all extremities x4  with baseline strength somewhat limited on left lower extremity because of recent surgery.--He does not have significant lower extremity edema pedal pulse intact  Neurologic appears to be grossly intact his speech is clear no lateralizing findings.  Psych he is alert and oriented pleasant and appropriate slightly anxious  Labs-.  February 18, 2018.  Sodium 138 potassium 5.2 BUN 28 creatinine 1.32-liver function tests within normal limits except ALT slightly low at 14.  WBC 9.8 hemoglobin 14.5 platelets of 405    Assessment and plan.  Tachycardia-this appears to be somewhat intermittent possibly more persistent last couple days--he is not in any distress denies any chest pain shortness of breath or leg pain did offer possibility of going to the ER for evaluation before going home but he was quite adamant he does not want to do that Advised him however that if he has any shortness of breath chest pain or increased leg pain to go to the ER for evaluation. Add endum- nursing staff has been able to contact Dr. Edwyna Ready Hall--and he will see patient after he leaves the facility today which is reassuring  2 history of left total knee replacement again he has been seen by orthopedics and thought to be doing well follow-up has been scheduled he will need continued PT and OT at home has Norco as well as Robaxin as needed for pain  #3 hypertension as noted above this appears to be stable he is on losartan and hydrochlorothiazide-I do note his potassium was mildly elevated at 5.2----again these labs will be sent to Dr. [name] for evaluation later today  #4 mild hyperkalemia please see above--I note he is on losartan-he is not on potassium supplementation  #5- history of Parkinson's disease this appears to be stable on Sinemt   #6-history of depression with anxiety is on Cymbalta-again diazepam as needed has been reinstituted---  #7-leukocytosis-this appears resolved on lab done today white count of  9.8-.  8.  History of renal insufficiency creatinine on lab today is up a bit from recent baseline at 1.32-again these labs will be provided to Dr. [name] for follow-up  Again he will be going home later today and will be seen by Dr. [name] expediently which is reassuring-clinically he appears stable but we have advised him if he has any shortness of breath chest pain or increased leg pain to get expedient evaluation This plan was discussed with Dr. Lyndel Safe via phone   [phone] note greater than 30 minutes spent on this discharge summary greater than 50% of time spent coordinating a plan of care for numerous diagnoses

## 2018-02-21 ENCOUNTER — Telehealth: Payer: Self-pay | Admitting: Orthopedic Surgery

## 2018-02-21 DIAGNOSIS — Z96652 Presence of left artificial knee joint: Secondary | ICD-10-CM

## 2018-02-21 NOTE — Telephone Encounter (Signed)
Patient called to relay that Dr Aline Brochure may already have been notified by [facility], physical therapist; states therapist said he is so far along with his therapy progress from being at [facility], [facility], that he is "ready to start out-patient therapy."  Please advise.  If out-patient therapy to be ordered, patient would like [facility] in [place].

## 2018-02-21 NOTE — Telephone Encounter (Signed)
Order sent.

## 2018-02-21 NOTE — Telephone Encounter (Signed)
I called patient to advise order sent.

## 2018-02-22 ENCOUNTER — Encounter (HOSPITAL_COMMUNITY): Payer: Self-pay | Admitting: Physical Therapy

## 2018-02-22 ENCOUNTER — Ambulatory Visit (HOSPITAL_COMMUNITY): Payer: PPO | Attending: Orthopedic Surgery | Admitting: Physical Therapy

## 2018-02-22 DIAGNOSIS — R2689 Other abnormalities of gait and mobility: Secondary | ICD-10-CM | POA: Insufficient documentation

## 2018-02-22 DIAGNOSIS — R29898 Other symptoms and signs involving the musculoskeletal system: Secondary | ICD-10-CM | POA: Insufficient documentation

## 2018-02-22 DIAGNOSIS — M25562 Pain in left knee: Secondary | ICD-10-CM | POA: Insufficient documentation

## 2018-02-22 DIAGNOSIS — H35432 Paving stone degeneration of retina, left eye: Secondary | ICD-10-CM | POA: Diagnosis not present

## 2018-02-22 DIAGNOSIS — M6281 Muscle weakness (generalized): Secondary | ICD-10-CM | POA: Diagnosis not present

## 2018-02-22 DIAGNOSIS — H31092 Other chorioretinal scars, left eye: Secondary | ICD-10-CM | POA: Diagnosis not present

## 2018-02-22 DIAGNOSIS — H35373 Puckering of macula, bilateral: Secondary | ICD-10-CM | POA: Diagnosis not present

## 2018-02-22 NOTE — Patient Instructions (Signed)
  QUAD SET Tighten your top thigh muscle as you attempt to press the back of your knee downward towards the table.  Repeat 15 Times Hold 5 Seconds Complete 1 Set Perform 1 Time(s) a Day   HEEL SLIDES - SUPINE Lying on your back with knees straight, slide the affected heel towards your buttock as you bend your knee. Hold a gentle stretch in this position and then return to original position. Left Leg  Repeat 15 Times Hold 5 Seconds Complete 1 Set Perform 1 Time(s) a Day

## 2018-02-22 NOTE — Therapy (Signed)
[place], [place], [address] Phone: [phone]   Fax:  [phone]  Physical Therapy Evaluation  Patient Details  Name: Zachary [name] MRN: [id number] Date of Birth: [birth year] Referring Provider: Arther Abbott MD    Encounter Date: 02/22/2018   Knee ROM:  Flexion 91 degrees Extension 18 Ambulation 32 feet x 2 (deferred longer walk due to high heart rate at this session)   PT End of Session - 02/22/18 1805    Visit Number  1    Number of Visits  19    Date for PT Re-Evaluation  03/15/18    Authorization Type  [employer]    Authorization Time Period  02/22/18 to 04/08/18    PT Start Time  1438 Patient arrived late    PT Stop Time  1524    PT Time Calculation (min)  46 min    Equipment Utilized During Treatment  Gait belt    Activity Tolerance  Patient tolerated treatment well    Behavior During Therapy  [facility] for tasks assessed/performed       Past Medical History:  Diagnosis Date  . Anxiety    takes Valium as needed  . Arthritis   . Bladder cancer ([place])    takes Rapaflo daily  . Blood dyscrasia    07/08/16: pt told he was a "free bleeder" after bleeding a lot when derm cut off mole on forehead. No excessive bleeding with minor wounds at home, no bleeding problems perioperatively with prior surgeries.  . Chronic back pain    spondylolisthesis  . Cluster headaches   . Depression    takes Lexapro daily  . Essential hypertension, benign    takes Diovan-HCT daily  . Hx of complications due to general anesthesia    Aborted surgery 07/15/2016 due to hypotension per pt  . Joint pain   . Obstructive sleep apnea on CPAP    uses CPAP @ night  . Pneumonia    "several times"--last time about 3-4 yrs ago  . Prediabetes     Past Surgical History:  Procedure Laterality Date  . ANTERIOR CERVICAL DECOMP/DISCECTOMY FUSION N/A 06/05/2013   Procedure:  C3-4 Anterior Cervical Discectomy and Fusion,  Allograft, Plate;  Surgeon: [name], MD;  Location: [place];  Service: Orthopedics;  Laterality: N/A;  C3-4 Anterior Cervical Discectomy and Fusion, Allograft, Plate  . Arthroscopic knee surgery    . BACK SURGERY      x 2  . BLADDER SURGERY     x 5 to remove tumor  . CARPAL TUNNEL RELEASE    . CATARACT EXTRACTION W/PHACO  12/19/2012   Procedure: CATARACT EXTRACTION PHACO AND INTRAOCULAR LENS PLACEMENT (IOC);  Surgeon: [name], MD;  Location: [facility];  Service: Ophthalmology;  Laterality: Left;  CDE: 26.80  . CATARACT EXTRACTION W/PHACO  01/09/2013   Procedure: CATARACT EXTRACTION PHACO AND INTRAOCULAR LENS PLACEMENT (IOC);  Surgeon: [name], MD;  Location: [facility];  Service: Ophthalmology;  Laterality: Right;  CDE:22.83  . CERVICAL FUSION  06/05/2013   C 3  C4  . CYSTOSCOPY    . REFRACTIVE SURGERY     Hx: of  . RETINAL DETACHMENT SURGERY    . TOTAL KNEE ARTHROPLASTY Left 02/01/2018   Procedure: TOTAL KNEE ARTHROPLASTY;  Surgeon: Carole Civil, MD;  Location: [facility];  Service: Orthopedics;  Laterality: Left;  . TRANSURETHRAL RESECTION OF BLADDER TUMOR Right 07/27/2015   Procedure: TRANSURETHRAL RESECTION OF BLADDER TUMOR (  TURBT);  Surgeon: [name], MD;  Location: [facility];  Service: Urology;  Laterality: Right;  . wisdom tooth extraction      There were no vitals filed for this visit.   Subjective Assessment - 02/22/18 1444    Subjective  Patient reported that he had a left total knee replacement on February 01, 2018. Patient reported that after the surgery he went to the [facility] center for 13 or 14 days. Patient reported that at worst his pain can reach a 7/10, however patient denied any pain at evaluation as he had just taken pain medication. Patient reported a history of bladder cancer 5-6 years ago and that he is in remission now. Patient stated that at night when he brushes against his scar it can be painful. Patient reported that he developed a high heart rate at the  hospital and that he had an ECG before being discharged, and that he is going to be following up with his physician Dr. [name] in a couple weeks. Patient did not report any symptoms associated with heart problems throughout evaluation.     Pertinent History  S/P Left TKA 02/01/18; Spinal fusion 2 years ago; HTN    Limitations  Standing;Walking    How long can you sit comfortably?  Not limited    How long can you stand comfortably?  1.5 minutes    How long can you walk comfortably?  5-10 minutes    Diagnostic tests  X-ray 12/15/17: "severe arthritis of the left knee with varus alignment"; [place] 12/23/17: "Segmental exam demonstrates no evidence of significant arterial"; X-ray 02/01/18: "Satisfactory total knee replacement"    Patient Stated Goals  To not let his knee stiffen up; to not have any pain in the left knee    Currently in Pain?  No/denies         [facility] PT Assessment - 02/22/18 0001      Assessment   Medical Diagnosis  S/P total knee replacement, left    Referring Provider  Arther Abbott MD    Next MD Visit  03/01/18    Prior [place] 13 or 14 days      Restrictions   Weight Bearing Restrictions  No    Other Position/Activity Restrictions  Patient reported that physician stated to not pick up anything up       Balance Screen   Has the patient fallen in the past 6 months  No    Has the patient had a decrease in activity level because of a fear of falling?   Yes    Is the patient reluctant to leave their home because of a fear of falling?   No      Home Environment   Living Environment  Private residence    Living Arrangements  Spouse/significant other    Type of [place] to enter    Entrance Stairs-Number of Steps  5    Entrance Stairs-Rails  None Side of the [place]  One level    [place] - 2 wheels;[place] - single point      Prior Function   Level of Independence  Independent    Vocation  Other (comment)     Vocation Requirements  Works as Paramedic needs to walk around      [employer] Status  Within Functional Limits for tasks assessed  Observation/Other Assessments   Focus on Therapeutic Outcomes (FOTO)   48% (52% limitation)      Circumferential Edema   Circumferential - Right  16.25 inches    Circumferential - Left   17.5 inches      Sensation   Light Touch  Impaired by gross assessment    Additional Comments  Patient reported numbness when therapist touched the lateral aspect of his left knee      AROM   AROM Assessment Site  Knee    Right/Left Knee  Left    Left Knee Extension  18 Lacking 18 degrees from 0    Left Knee Flexion  91      Strength   Right/Left Hip  Right;Left    Right Hip Flexion  5/5    Right Hip Extension  2+/5 Patient reported he could not lay prone; tested standing    Right Hip ABduction  5/5    Left Hip Flexion  4+/5    Left Hip Extension  2/5 Patient reported he could not lay prone; tested standing    Left Hip ABduction  4+/5    Right/Left Knee  Right;Left    Right Knee Flexion  5/5    Right Knee Extension  5/5    Left Knee Flexion  4+/5    Left Knee Extension  4+/5    Right/Left Ankle  Right;Left    Right Ankle Dorsiflexion  5/5    Left Ankle Dorsiflexion  4+/5      Palpation   Palpation comment  Patient reported tenderness around left knee and numbness at the lateral side of the left knee      Ambulation/Gait   Ambulation/Gait  Yes    Ambulation Distance (Feet)  32 Feet 32 feet Into and out of clinic    Assistive device  Rolling walker    Gait Pattern  Decreased stance time - left;Decreased step length - right;Decreased hip/knee flexion - left    Gait Comments  Patient's total available gait distance was not assessed this session due to his heart rate being elevated at 127 bpm      Static Standing Balance   Static Standing - Balance Support  No upper extremity supported    Static Standing Balance -   Activities   Single Leg Stance - Right Leg;Single Leg Stance - Left Leg    Static Standing - Comment/# of Minutes  Rt.: 5 seconds; Lt.: 2 seconds      Standardized Balance Assessment   Five times sit to stand comments   Patient performed in 17 seconds             Objective measurements completed on examination: See above findings.              PT Education - 02/22/18 1804    Education provided  Yes    Education Details  Patient was educated on examination findings, plan of care, and HEP.     Person(s) Educated  Patient    Methods  Explanation;Demonstration;Handout    Comprehension  Verbalized understanding       PT Short Term Goals - 02/22/18 1823      PT SHORT TERM GOAL #1   Title  Patient will demonstrate understanding and report regular compliance with HEP.    Time  3    Period  Weeks    Status  New    Target Date  03/15/18      PT SHORT TERM GOAL #2  Title  Patient will demonstrate left knee extension/flexion range of motion of at least 5-100 degrees to assist with more normalized gait pattern and stair ambulation.    Time  3    Period  Weeks    Status  New    Target Date  03/15/18      PT SHORT TERM GOAL #3   Title  Patient will perform single limb stance on left lower extremity for 5 seconds in order to assist with stair ambulation.    Time  3    Period  Weeks    Status  New    Target Date  03/15/18        PT Long Term Goals - 02/22/18 1825      PT LONG TERM GOAL #1   Title  Patient will demonstrate improvement of 1/2 MMT grade in all muscle groups deficient at evaluation on the left lower extremity as evidence of improved strength to assist with stair ambulation and gait.      Time  6    Period  Weeks    Status  New    Target Date  04/05/18      PT LONG TERM GOAL #2   Title  Patient will improve ROM for left knee extension/flexion to 0-120 degrees to improve ease of gait, sit to stands, squatting, and other functional mobility.    Time   6    Period  Weeks    Status  New    Target Date  04/05/18      PT LONG TERM GOAL #3   Title  Patient will perform 5 times sit to stand in 13 seconds or less as evidence of improved balance and decreased risk of falls.     Time  6    Period  Weeks    Status  New    Target Date  04/05/18      PT LONG TERM GOAL #4   Title  Patient will report ability to ambulate for 20 minutes with no greater than 2/10 pain for improved mobility and return to work.     Time  6    Period  Weeks    Status  New    Target Date  04/05/18             Plan - 02/22/18 1808    Clinical Impression Statement  Patient is a 72 year old male who presented to physical therapy today s/p left total knee arthroplasty on 02/01/18. Upon examination patient demonstrated decreased left knee range of motion and decreased strength of the left lower extremity. In addition, patient demonstrated impairments with gait as patient ambulated with rolling walker decreased stance time on the left lower extremity, decreased step length on the right lower extremity and ambulated with rolling walker. Patient demonstrated decreased balance as he performed five times sit to stand in 17 seconds this session and patient maintained single limb stance on the right lower extremity for 5 seconds and for 2 seconds on the left lower extremity. Patient's heart rate was found to be 127 at rest this session and therefore more strenuous ambulation testing was not attempted this session. Patient would benefit from skilled physical therapy in order to address the abovementioned deficits and improve patient's overall functional mobility.     History and Personal Factors relevant to plan of care:  S/P left TKA 02/01/18; HTN; Parkinson's disease; Spinal fusion 2 years ago; History of bladder cancer; elevated heart rate  Clinical Presentation  Stable    Clinical Presentation due to:  MMT, ROM, 5xSTS, FOTO, clinical judgement     Clinical Decision Making   Moderate    Rehab Potential  Good    Clinical Impairments Affecting Rehab Potential  Positive: highly motivated; positive support system; Negative: comorbidities; financial restrictions     PT Frequency  3x / week    PT Duration  6 weeks    PT Treatment/Interventions  ADLs/Self Care Home Management;Cryotherapy;DME Instruction;Gait training;Stair training;Functional mobility training;Therapeutic activities;Therapeutic exercise;Balance training;Neuromuscular re-education;Patient/family education;Orthotic Fit/Training;Manual techniques;Compression bandaging;Scar mobilization;Passive range of motion;Energy conservation    PT Next Visit Plan  Review patient's evaluation; ask travel screening questions; review patient's HEP; follow-up about if patient contacted physician (fill out release form to contact non-referring physician) - perform 3MWT if patient reports no symptoms and after checking vitals; focus on improving patient's knee ROM especially extension; monitor vitals and for cardiac symptoms throughout therapy    PT Home Exercise Plan  Evaluation: Quad sets supine 1 x 15 with 5 second holds 1x/day, Heel slides supine 1 x 15 with 5 second holds 1x/day    Recommended Other Services  Cardiology/physician follow-up about resting heart rate    Consulted and Agree with Plan of Care  Patient       Patient will benefit from skilled therapeutic intervention in order to improve the following deficits and impairments:  Abnormal gait, Decreased balance, Decreased endurance, Decreased mobility, Difficulty walking, Hypomobility, Impaired sensation, Decreased range of motion, Decreased scar mobility, Increased edema, Improper body mechanics, Decreased activity tolerance, Decreased strength, Impaired flexibility, Pain  Visit Diagnosis: Muscle weakness (generalized)  Acute pain of left knee  Other abnormalities of gait and mobility  Other symptoms and signs involving the musculoskeletal system     Problem  List Patient Active Problem List   Diagnosis Date Noted  . S/P total knee replacement, 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 02/07/2018  . Primary osteoarthritis of left knee 02/01/2018  . Postural hypotension 07/23/2016  . Dysphagia 07/23/2016  . Obstructive sleep apnea 07/21/2016  . Hyperglycemia 07/21/2016  . Anemia, unspecified 07/21/2016  . Spinal stenosis of lumbar region 07/15/2016  . Bladder tumor 07/27/2015  . HNP (herniated nucleus pulposus), cervical 06/05/2013    Class: Diagnosis of  . 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  . Muscle spasms of neck 04/04/2013  . 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 OA (osteoarthritis) of knee 02/22/2013  . Iliotibial band syndrome of left side 11/22/2012  . Localized, primary osteoarthritis of the ankle and foot 10/11/2012  . Difficulty in walking(719.7) 08/17/2012  . Peroneal tendinitis 08/16/2012  . Precordial pain 02/09/2012  . Essential hypertension, benign 02/09/2012  . Mixed hyperlipidemia 02/09/2012  . Dysthymia 02/04/2012  . Abnormality of gait 12/23/2011  . Lateral epicondylitis/tennis elbow 05/06/2011  . TENOSYNOVITIS OF FOOT AND ANKLE 10/22/2010    Clarene Critchley 02/22/2018, 6:36 PM  [place] [address] [place], [place], [address] Phone: [phone]   Fax:  [phone]  Name: Zachary [name] MRN: [id number] Date of Birth: [birth year]

## 2018-02-23 ENCOUNTER — Encounter (HOSPITAL_COMMUNITY): Payer: Self-pay

## 2018-02-23 ENCOUNTER — Ambulatory Visit (HOSPITAL_COMMUNITY): Payer: PPO

## 2018-02-23 ENCOUNTER — Other Ambulatory Visit: Payer: Self-pay

## 2018-02-23 VITALS — HR 89

## 2018-02-23 DIAGNOSIS — H524 Presbyopia: Secondary | ICD-10-CM | POA: Diagnosis not present

## 2018-02-23 DIAGNOSIS — H5203 Hypermetropia, bilateral: Secondary | ICD-10-CM | POA: Diagnosis not present

## 2018-02-23 DIAGNOSIS — R2689 Other abnormalities of gait and mobility: Secondary | ICD-10-CM

## 2018-02-23 DIAGNOSIS — M25562 Pain in left knee: Secondary | ICD-10-CM

## 2018-02-23 DIAGNOSIS — R29898 Other symptoms and signs involving the musculoskeletal system: Secondary | ICD-10-CM

## 2018-02-23 DIAGNOSIS — M6281 Muscle weakness (generalized): Secondary | ICD-10-CM

## 2018-02-23 DIAGNOSIS — H52223 Regular astigmatism, bilateral: Secondary | ICD-10-CM | POA: Diagnosis not present

## 2018-02-23 NOTE — Therapy (Addendum)
[place], [place], [address] Phone: [phone]   Fax:  [phone]  Physical Therapy Treatment  Patient Details  Name: Zachary [name] MRN: [id number] Date of Birth: Jan 14, 1946 Referring Provider: Arther Abbott, MD   Encounter Date: 02/23/2018  PT End of Session - 02/23/18 1539    Visit Number  2    Number of Visits  19    Date for PT Re-Evaluation  03/15/18    Authorization Type  [employer]    Authorization Time Period  02/22/18 to 04/08/18    PT Start Time  1520    PT Stop Time  1603 last 3' on bike for mobility, no charge    PT Time Calculation (min)  43 min    Activity Tolerance  Patient tolerated treatment well;No increased pain    Behavior During Therapy  WFL for tasks assessed/performed       Past Medical History:  Diagnosis Date  . Anxiety    takes Valium as needed  . Arthritis   . Bladder cancer ([place])    takes Rapaflo daily  . Blood dyscrasia    07/08/16: pt told he was a "free bleeder" after bleeding a lot when derm cut off mole on forehead. No excessive bleeding with minor wounds at home, no bleeding problems perioperatively with prior surgeries.  . Chronic back pain    spondylolisthesis  . Cluster headaches   . Depression    takes Lexapro daily  . Essential hypertension, benign    takes Diovan-HCT daily  . Hx of complications due to general anesthesia    Aborted surgery 07/15/2016 due to hypotension per pt  . Joint pain   . Obstructive sleep apnea on CPAP    uses CPAP @ night  . Pneumonia    "several times"--last time about 3-4 yrs ago  . Prediabetes     Past Surgical History:  Procedure Laterality Date  . ANTERIOR CERVICAL DECOMP/DISCECTOMY FUSION N/A 06/05/2013   Procedure:  C3-4 Anterior Cervical Discectomy and Fusion, Allograft, Plate;  Surgeon: [name], MD;  Location: [facility];  Service: Orthopedics;  Laterality: N/A;  C3-4 Anterior Cervical Discectomy and Fusion,  Allograft, Plate  . Arthroscopic knee surgery    . BACK SURGERY      x 2  . BLADDER SURGERY     x 5 to remove tumor  . CARPAL TUNNEL RELEASE    . CATARACT EXTRACTION W/PHACO  12/19/2012   Procedure: CATARACT EXTRACTION PHACO AND INTRAOCULAR LENS PLACEMENT (IOC);  Surgeon: [name], MD;  Location: [facility];  Service: Ophthalmology;  Laterality: Left;  CDE: 26.80  . CATARACT EXTRACTION W/PHACO  01/09/2013   Procedure: CATARACT EXTRACTION PHACO AND INTRAOCULAR LENS PLACEMENT (IOC);  Surgeon: [name], MD;  Location: [facility];  Service: Ophthalmology;  Laterality: Right;  CDE:22.83  . CERVICAL FUSION  06/05/2013   C 3  C4  . CYSTOSCOPY    . REFRACTIVE SURGERY     Hx: of  . RETINAL DETACHMENT SURGERY    . TOTAL KNEE ARTHROPLASTY Left 02/01/2018   Procedure: TOTAL KNEE ARTHROPLASTY;  Surgeon: Carole Civil, MD;  Location: [facility];  Service: Orthopedics;  Laterality: Left;  . TRANSURETHRAL RESECTION OF BLADDER TUMOR Right 07/27/2015   Procedure: TRANSURETHRAL RESECTION OF BLADDER TUMOR (TURBT);  Surgeon: [name], MD;  Location: [facility];  Service: Urology;  Laterality: Right;  . wisdom tooth extraction      Vitals:   02/23/18 1549  Pulse: 89    Subjective Assessment - 02/23/18 1526    Subjective  Pt stated knee feels the best it has in 6 years, no reports of pain today.  Reports compliance wiht HEP    Pertinent History  S/P Left TKA 02/01/18; Spinal fusion 2 years ago; HTN    Patient Stated Goals  To not let his knee stiffen up; to not have any pain in the left knee    Currently in Pain?  No/denies          02/23/18 0001  Assessment  Medical Diagnosis S/P total knee replacement, left  Referring Provider Arther Abbott, MD  Next MD Visit 03/01/18  6 minute walk test results   Aerobic Endurance Distance Walked 602  Endurance additional comments 3MWT        02/23/18 0001  Exercises  Exercises Knee/Hip  Knee/Hip Exercises: Stretches  Passive Hamstring Stretch 3  reps;30 seconds  Passive Hamstring Stretch Limitations supine  Gastroc Stretch 3 reps  Gastroc Stretch Limitations slant board  Knee/Hip Exercises: Aerobic     Knee/Hip Exercises: Supine  Quad Sets 10 reps  Short Arc Quad Sets 15 reps  Heel Slides 10 reps  Heel Slides Limitations 5" holds  Patellar Mobs all directions  Knee Extension AROM  Knee Extension Limitations 12  Knee Flexion AROM  Knee Flexion Limitations 101                         PT Education - 02/23/18 1554    Education provided  Yes    Education Details  reviewed goals, assured compliance with HEP and copy of eval given to pt.  Discussed cardiac issues and verbal permission to discuss wiht PCP [name], MD)    Person(s) Educated  Patient    Methods  Explanation;Demonstration;Handout    Comprehension  Verbalized understanding;Returned demonstration;Need further instruction       PT Short Term Goals - 02/22/18 1823      PT SHORT TERM GOAL #1   Title  Patient will demonstrate understanding and report regular compliance with HEP.    Time  3    Period  Weeks    Status  New    Target Date  03/15/18      PT SHORT TERM GOAL #2   Title  Patient will demonstrate left knee extension/flexion range of motion of at least 5-100 degrees to assist with more normalized gait pattern and stair ambulation.    Time  3    Period  Weeks    Status  New    Target Date  03/15/18      PT SHORT TERM GOAL #3   Title  Patient will perform single limb stance on left lower extremity for 5 seconds in order to assist with stair ambulation.    Time  3    Period  Weeks    Status  New    Target Date  03/15/18        PT Long Term Goals - 02/22/18 1825      PT LONG TERM GOAL #1   Title  Patient will demonstrate improvement of 1/2 MMT grade in all muscle groups deficient at evaluation on the left lower extremity as evidence of improved strength to assist with stair ambulation and gait.      Time  6    Period   Weeks    Status  New    Target Date  04/05/18  PT LONG TERM GOAL #2   Title  Patient will improve ROM for left knee extension/flexion to 0-120 degrees to improve ease of gait, sit to stands, squatting, and other functional mobility.    Time  6    Period  Weeks    Status  New    Target Date  04/05/18      PT LONG TERM GOAL #3   Title  Patient will perform 5 times sit to stand in 13 seconds or less as evidence of improved balance and decreased risk of falls.     Time  6    Period  Weeks    Status  New    Target Date  04/05/18      PT LONG TERM GOAL #4   Title  Patient will report ability to ambulate for 20 minutes with no greater than 2/10 pain for improved mobility and return to work.     Time  6    Period  Weeks    Status  New    Target Date  04/05/18            Plan - 02/23/18 1602    Clinical Impression Statement  Reviewed goals, assured compliance and proper form/technique with HEP and copy of eval given to pt.  Reviewed cardiac issues with pt and verbal permission to discuss with PCP.  Vitals were assessed through session with no s/s.  No reports of travel in the last 21 days.  Session focus on knee mobility.  Pt progressing well with good form through session and improved AROM at EOS to 12-101 (was 18-91 degrees last session.).  Minimal tactile cueing to improve distal quad activaiton to improve patella movements.  No reoprts of pain through session.  Reviewed RICE technqiues for pain and edema.      Rehab Potential  Good    Clinical Impairments Affecting Rehab Potential  Positive: highly motivated; positive support system; Negative: comorbidities; financial restrictions     PT Frequency  3x / week    PT Duration  6 weeks    PT Treatment/Interventions  ADLs/Self Care Home Management;Cryotherapy;DME Instruction;Gait training;Stair training;Functional mobility training;Therapeutic activities;Therapeutic exercise;Balance training;Neuromuscular re-education;Patient/family  education;Orthotic Fit/Training;Manual techniques;Compression bandaging;Scar mobilization;Passive range of motion;Energy conservation    PT Next Visit Plan  monitor vitals and for cardiac symptoms throughout therapy, f/u with pt and discussion with PCP concerning. Continue session focus with knee mobilty primarly extension>flexion.  Next session continues wiht quad strengthening, add standing hs stretch, knee drives, rocker board and trial wiht prone position (quad st and prone knee hang) if able to tolerate position    PT Home Exercise Plan  Evaluation: Quad sets supine 1 x 15 with 5 second holds 1x/day, Heel slides supine 1 x 15 with 5 second holds 1x/day       Patient will benefit from skilled therapeutic intervention in order to improve the following deficits and impairments:  Abnormal gait, Decreased balance, Decreased endurance, Decreased mobility, Difficulty walking, Hypomobility, Impaired sensation, Decreased range of motion, Decreased scar mobility, Increased edema, Improper body mechanics, Decreased activity tolerance, Decreased strength, Impaired flexibility, Pain  Visit Diagnosis: Muscle weakness (generalized)  Acute pain of left knee  Other abnormalities of gait and mobility  Other symptoms and signs involving the musculoskeletal system     Problem List Patient Active Problem List   Diagnosis Date Noted  . S/P total knee replacement, 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 02/07/2018  . Primary osteoarthritis of left knee 02/01/2018  . Postural hypotension 07/23/2016  .  Dysphagia 07/23/2016  . Obstructive sleep apnea 07/21/2016  . Hyperglycemia 07/21/2016  . Anemia, unspecified 07/21/2016  . Spinal stenosis of lumbar region 07/15/2016  . Bladder tumor 07/27/2015  . HNP (herniated nucleus pulposus), cervical 06/05/2013    Class: Diagnosis of  . 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  . Muscle spasms of neck 04/04/2013  . 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 OA (osteoarthritis) of knee  02/22/2013  . Iliotibial band syndrome of left side 11/22/2012  . Localized, primary osteoarthritis of the ankle and foot 10/11/2012  . Difficulty in walking(719.7) 08/17/2012  . Peroneal tendinitis 08/16/2012  . Precordial pain 02/09/2012  . Essential hypertension, benign 02/09/2012  . Mixed hyperlipidemia 02/09/2012  . Dysthymia 02/04/2012  . Abnormality of gait 12/23/2011  . Lateral epicondylitis/tennis elbow 05/06/2011  . TENOSYNOVITIS OF FOOT AND ANKLE 10/22/2010   Ihor Austin, LPTA; CBIS [phone]  Aldona Lento 02/24/2018, 3:54 PM  [place], [place], [address] Phone: [phone]   Fax:  [phone]  Name: Zachary [name] MRN: [id number] Date of Birth: [birth year]

## 2018-02-23 NOTE — Patient Outreach (Signed)
[facility]) Care Management  02/23/2018  [place]ain [birth year] [phone]   [employer] transition of care referral received 02/22/18. RNCM called for transition of care. Client confirmed his name and date of birth. Client denies ever living [address] and questioned Care Management services. RNCM explained care management services. Client reports he is doing well and is on his way to outpatient rehabilitation. He says he plans to participate in silver sneakers program when recommended by his providers.  RNCM attempted to obtain correct mailing address in order to send information on [facility] Management services, however client became suspicious again and declined further engagement. RNCM reinforced that [facility] management are services are provided by both his primary care provider and Insurance plan. RNCM encouraged client to discuss with his primary care regarding [facility] Management service. Client declines and refuses any further engagement.  Plan: close case.  Thea Silversmith, RN, MSN, [place] Coordinator Cell: [phone]

## 2018-02-25 ENCOUNTER — Encounter (HOSPITAL_COMMUNITY): Payer: Self-pay

## 2018-02-25 ENCOUNTER — Ambulatory Visit (HOSPITAL_COMMUNITY): Payer: PPO | Attending: Orthopedic Surgery

## 2018-02-25 DIAGNOSIS — R2689 Other abnormalities of gait and mobility: Secondary | ICD-10-CM | POA: Diagnosis not present

## 2018-02-25 DIAGNOSIS — M6281 Muscle weakness (generalized): Secondary | ICD-10-CM | POA: Diagnosis not present

## 2018-02-25 DIAGNOSIS — M25562 Pain in left knee: Secondary | ICD-10-CM | POA: Diagnosis not present

## 2018-02-25 DIAGNOSIS — R29898 Other symptoms and signs involving the musculoskeletal system: Secondary | ICD-10-CM | POA: Diagnosis not present

## 2018-02-25 NOTE — Therapy (Signed)
[place], [place], [address] Phone: [phone]   Fax:  [phone]  Physical Therapy Treatment  Patient Details  Name: Zachary [name] MRN: [id number] Date of Birth: 04-10-1946 Referring Provider: Arther Abbott, MD   Encounter Date: 02/25/2018  PT End of Session - 02/25/18 1039    Visit Number  3    Number of Visits  19    Date for PT Re-Evaluation  03/15/18    Authorization Type  [employer]    Authorization Time Period  02/22/18 to 04/08/18    PT Start Time  1033    PT Stop Time  1117    PT Time Calculation (min)  44 min    Activity Tolerance  Patient tolerated treatment well;No increased pain    Behavior During Therapy  WFL for tasks assessed/performed       Past Medical History:  Diagnosis Date  . Anxiety    takes Valium as needed  . Arthritis   . Bladder cancer ([place])    takes Rapaflo daily  . Blood dyscrasia    07/08/16: pt told he was a "free bleeder" after bleeding a lot when derm cut off mole on forehead. No excessive bleeding with minor wounds at home, no bleeding problems perioperatively with prior surgeries.  . Chronic back pain    spondylolisthesis  . Cluster headaches   . Depression    takes Lexapro daily  . Essential hypertension, benign    takes Diovan-HCT daily  . Hx of complications due to general anesthesia    Aborted surgery 07/15/2016 due to hypotension per pt  . Joint pain   . Obstructive sleep apnea on CPAP    uses CPAP @ night  . Pneumonia    "several times"--last time about 3-4 yrs ago  . Prediabetes     Past Surgical History:  Procedure Laterality Date  . ANTERIOR CERVICAL DECOMP/DISCECTOMY FUSION N/A 06/05/2013   Procedure:  C3-4 Anterior Cervical Discectomy and Fusion, Allograft, Plate;  Surgeon: [name], MD;  Location: [facility];  Service: Orthopedics;  Laterality: N/A;  C3-4 Anterior Cervical Discectomy and Fusion, Allograft, Plate  . Arthroscopic knee  surgery    . BACK SURGERY      x 2  . BLADDER SURGERY     x 5 to remove tumor  . CARPAL TUNNEL RELEASE    . CATARACT EXTRACTION W/PHACO  12/19/2012   Procedure: CATARACT EXTRACTION PHACO AND INTRAOCULAR LENS PLACEMENT (IOC);  Surgeon: [name], MD;  Location: [facility];  Service: Ophthalmology;  Laterality: Left;  CDE: 26.80  . CATARACT EXTRACTION W/PHACO  01/09/2013   Procedure: CATARACT EXTRACTION PHACO AND INTRAOCULAR LENS PLACEMENT (IOC);  Surgeon: [name], MD;  Location: [facility];  Service: Ophthalmology;  Laterality: Right;  CDE:22.83  . CERVICAL FUSION  06/05/2013   C 3  C4  . CYSTOSCOPY    . REFRACTIVE SURGERY     Hx: of  . RETINAL DETACHMENT SURGERY    . TOTAL KNEE ARTHROPLASTY Left 02/01/2018   Procedure: TOTAL KNEE ARTHROPLASTY;  Surgeon: Carole Civil, MD;  Location: [facility];  Service: Orthopedics;  Laterality: Left;  . TRANSURETHRAL RESECTION OF BLADDER TUMOR Right 07/27/2015   Procedure: TRANSURETHRAL RESECTION OF BLADDER TUMOR (TURBT);  Surgeon: [name], MD;  [facility];  Service: Urology;  Laterality: Right;  . wisdom tooth extraction      Vitals:    Subjective Assessment - 02/25/18 1037    Subjective  Pt stated knee is feeling good today, reports he was taking nap and woke up with pain that resolved it.  Compliant with HEP daily.  Pt stated he discussed cardiac issues prior and tends to have increased heart rate when drinks caffeine beverages.    Pertinent History  S/P Left TKA 02/01/18; Spinal fusion 2 years ago; HTN    Patient Stated Goals  To not let his knee stiffen up; to not have any pain in the left knee    Currently in Pain?  No/denies                      [facility] Adult PT Treatment/Exercise - 02/25/18 0001      Knee/Hip Exercises: Stretches   Active Hamstring Stretch  3 reps;30 seconds;Limitations    Active Hamstring Stretch Limitations  12in step    Knee: Self-Stretch to increase Flexion  5 reps;10 seconds;Limitations     Knee: Self-Stretch Limitations  knee drives on 64PP step 5x 10"    Gastroc Stretch  3 reps;30 seconds    Gastroc Stretch Limitations  slant board      Knee/Hip Exercises: Standing   Heel Raises  15 reps;Limitations    Heel Raises Limitations  Toe raises    Terminal Knee Extension  Limitations    Terminal Knee Extension Limitations  next session    Rocker Board  2 minutes;Limitations    Rocker Board Limitations  lateral and DF/PF    Gait Training  226 ft cueing for mechanics (heel strike with extensin and flexion at toe push offs      Knee/Hip Exercises: Supine   Quad Sets  15 reps    Short Arc Quad Sets  15 reps    Heel Slides  15 reps    Heel Slides Limitations  5" holds     Bridges  10 reps    Straight Leg Raises  10 reps;Limitations with quad set prior SLR to reduce lag    Patellar Mobs  all directions    Knee Extension  AROM    Knee Extension Limitations  6    Knee Flexion  AROM    Knee Flexion Limitations  107               PT Short Term Goals - 02/22/18 1823      PT SHORT TERM GOAL #1   Title  Patient will demonstrate understanding and report regular compliance with HEP.    Time  3    Period  Weeks    Status  New    Target Date  03/15/18      PT SHORT TERM GOAL #2   Title  Patient will demonstrate left knee extension/flexion range of motion of at least 5-100 degrees to assist with more normalized gait pattern and stair ambulation.    Time  3    Period  Weeks    Status  New    Target Date  03/15/18      PT SHORT TERM GOAL #3   Title  Patient will perform single limb stance on left lower extremity for 5 seconds in order to assist with stair ambulation.    Time  3    Period  Weeks    Status  New    Target Date  03/15/18        PT Long Term Goals - 02/22/18 1825      PT LONG TERM GOAL #1   Title  Patient will demonstrate  improvement of 1/2 MMT grade in all muscle groups deficient at evaluation on the left lower extremity as evidence of improved  strength to assist with stair ambulation and gait.      Time  6    Period  Weeks    Status  New    Target Date  04/05/18      PT LONG TERM GOAL #2   Title  Patient will improve ROM for left knee extension/flexion to 0-120 degrees to improve ease of gait, sit to stands, squatting, and other functional mobility.    Time  6    Period  Weeks    Status  New    Target Date  04/05/18      PT LONG TERM GOAL #3   Title  Patient will perform 5 times sit to stand in 13 seconds or less as evidence of improved balance and decreased risk of falls.     Time  6    Period  Weeks    Status  New    Target Date  04/05/18      PT LONG TERM GOAL #4   Title  Patient will report ability to ambulate for 20 minutes with no greater than 2/10 pain for improved mobility and return to work.     Time  6    Period  Weeks    Status  New    Target Date  04/05/18            Plan - 02/25/18 1110    Clinical Impression Statement  Pt progressing with reports of minimal pain, regular ice application and compliance with HEP.  Reviewed cardiac issues with pt who stated that was reviewed prior surgery and had no s/s or issues, stated he has increased heart rate when drink caffeine beverages.  Session focus on knee mobility and functional strengthening to improve gait mechanics.  Began rocker board and heel raises to improve gait mechancis and standing stretches.  AROM progressing great with improved  6-107 degrees (was 12-101 last session).  Good patella mobility noted wiht quad sets.     Rehab Potential  Good    Clinical Impairments Affecting Rehab Potential  Positive: highly motivated; positive support system; Negative: comorbidities; financial restrictions     PT Frequency  3x / week    PT Duration  6 weeks    PT Treatment/Interventions  ADLs/Self Care Home Management;Cryotherapy;DME Instruction;Gait training;Stair training;Functional mobility training;Therapeutic activities;Therapeutic exercise;Balance  training;Neuromuscular re-education;Patient/family education;Orthotic Fit/Training;Manual techniques;Compression bandaging;Scar mobilization;Passive range of motion;Energy conservation    PT Next Visit Plan  Continue session focus with knee mobility extension > flexion.  Add standing TKE.  Continue to monitor vitals/cardiac symptoms.    PT Home Exercise Plan  Evaluation: Quad sets supine 1 x 15 with 5 second holds 1x/day, Heel slides supine 1 x 15 with 5 second holds 1x/day       Patient will benefit from skilled therapeutic intervention in order to improve the following deficits and impairments:  Abnormal gait, Decreased balance, Decreased endurance, Decreased mobility, Difficulty walking, Hypomobility, Impaired sensation, Decreased range of motion, Decreased scar mobility, Increased edema, Improper body mechanics, Decreased activity tolerance, Decreased strength, Impaired flexibility, Pain  Visit Diagnosis: Muscle weakness (generalized)  Acute pain of left knee  Other abnormalities of gait and mobility  Other symptoms and signs involving the musculoskeletal system     Problem List Patient Active Problem List   Diagnosis Date Noted  . S/P total knee replacement, 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 02/07/2018  .  Primary osteoarthritis of left knee 02/01/2018  . Postural hypotension 07/23/2016  . Dysphagia 07/23/2016  . Obstructive sleep apnea 07/21/2016  . Hyperglycemia 07/21/2016  . Anemia, unspecified 07/21/2016  . Spinal stenosis of lumbar region 07/15/2016  . Bladder tumor 07/27/2015  . HNP (herniated nucleus pulposus), cervical 06/05/2013    Class: Diagnosis of  . 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  . Muscle spasms of neck 04/04/2013  . 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 OA (osteoarthritis) of knee 02/22/2013  . Iliotibial band syndrome of left side 11/22/2012  . Localized, primary osteoarthritis of the ankle and foot 10/11/2012  . Difficulty in walking(719.7) 08/17/2012  .  Peroneal tendinitis 08/16/2012  . Precordial pain 02/09/2012  . Essential hypertension, benign 02/09/2012  . Mixed hyperlipidemia 02/09/2012  . Dysthymia 02/04/2012  . Abnormality of gait 12/23/2011  . Lateral epicondylitis/tennis elbow 05/06/2011  . TENOSYNOVITIS OF FOOT AND ANKLE 10/22/2010   Ihor Austin, LPTA; CBIS [phone]  Aldona Lento 02/25/2018, 12:58 PM  [place], [place], [address] Phone: [phone]   Fax:  [phone]  Name: Zachary [name] MRN: [id number] Date of Birth: [birth year]

## 2018-02-28 ENCOUNTER — Ambulatory Visit (HOSPITAL_COMMUNITY): Payer: PPO | Admitting: Physical Therapy

## 2018-02-28 DIAGNOSIS — R2689 Other abnormalities of gait and mobility: Secondary | ICD-10-CM

## 2018-02-28 DIAGNOSIS — M6281 Muscle weakness (generalized): Secondary | ICD-10-CM

## 2018-02-28 DIAGNOSIS — M25562 Pain in left knee: Secondary | ICD-10-CM

## 2018-02-28 NOTE — Therapy (Signed)
[place], [place], [address] Phone: [phone]   Fax:  [phone]  Physical Therapy Treatment  Patient Details  Name: Zachary [name] MRN: [id number] Date of Birth: 06-May-1946 Referring Provider: Arther Abbott, MD   Encounter Date: 02/28/2018  PT End of Session - 02/28/18 1740    Visit Number  4    Number of Visits  19    Date for PT Re-Evaluation  03/15/18    Authorization Type  [employer]    Authorization Time Period  02/22/18 to 04/08/18    PT Start Time  1600    PT Stop Time  1645    PT Time Calculation (min)  45 min    Activity Tolerance  Patient tolerated treatment well;No increased pain    Behavior During Therapy  WFL for tasks assessed/performed       Past Medical History:  Diagnosis Date  . Anxiety    takes Valium as needed  . Arthritis   . Bladder cancer ([place])    takes Rapaflo daily  . Blood dyscrasia    07/08/16: pt told he was a "free bleeder" after bleeding a lot when derm cut off mole on forehead. No excessive bleeding with minor wounds at home, no bleeding problems perioperatively with prior surgeries.  . Chronic back pain    spondylolisthesis  . Cluster headaches   . Depression    takes Lexapro daily  . Essential hypertension, benign    takes Diovan-HCT daily  . Hx of complications due to general anesthesia    Aborted surgery 07/15/2016 due to hypotension per pt  . Joint pain   . Obstructive sleep apnea on CPAP    uses CPAP @ night  . Pneumonia    "several times"--last time about 3-4 yrs ago  . Prediabetes     Past Surgical History:  Procedure Laterality Date  . ANTERIOR CERVICAL DECOMP/DISCECTOMY FUSION N/A 06/05/2013   Procedure:  C3-4 Anterior Cervical Discectomy and Fusion, Allograft, Plate;  Surgeon: [name], MD;  Location: [facility];  Service: Orthopedics;  Laterality: N/A;  C3-4 Anterior Cervical Discectomy and Fusion, Allograft, Plate  . Arthroscopic knee  surgery    . BACK SURGERY      x 2  . BLADDER SURGERY     x 5 to remove tumor  . CARPAL TUNNEL RELEASE    . CATARACT EXTRACTION W/PHACO  12/19/2012   Procedure: CATARACT EXTRACTION PHACO AND INTRAOCULAR LENS PLACEMENT (IOC);  Surgeon: [name], MD;  Location: [facility];  Service: Ophthalmology;  Laterality: Left;  CDE: 26.80  . CATARACT EXTRACTION W/PHACO  01/09/2013   Procedure: CATARACT EXTRACTION PHACO AND INTRAOCULAR LENS PLACEMENT (IOC);  Surgeon: [name], MD;  Location: [facility];  Service: Ophthalmology;  Laterality: Right;  CDE:22.83  . CERVICAL FUSION  06/05/2013   C 3  C4  . CYSTOSCOPY    . REFRACTIVE SURGERY     Hx: of  . RETINAL DETACHMENT SURGERY    . TOTAL KNEE ARTHROPLASTY Left 02/01/2018   Procedure: TOTAL KNEE ARTHROPLASTY;  Surgeon: Carole Civil, MD;  Location: [facility];  Service: Orthopedics;  Laterality: Left;  . TRANSURETHRAL RESECTION OF BLADDER TUMOR Right 07/27/2015   Procedure: TRANSURETHRAL RESECTION OF BLADDER TUMOR (TURBT);  Surgeon: [name], MD;  Location: [facility];  Service: Urology;  Laterality: Right;  . wisdom tooth extraction      There were no vitals filed for this visit.  Subjective Assessment - 02/28/18  1607    Subjective  Pt states his pain went up yesterday, no known cause, but currently 4/10 on the Rt side of his knee.  States he used heat on it and made it feel better (recommended he not do this as he is still in acute phase).     Currently in Pain?  Yes    Pain Score  4     Pain Location  Knee    Pain Orientation  Left                      [facility] Adult PT Treatment/Exercise - 02/28/18 0001      Knee/Hip Exercises: Stretches   Active Hamstring Stretch  3 reps;30 seconds;Limitations    Active Hamstring Stretch Limitations  12in step    Knee: Self-Stretch to increase Flexion  5 reps;10 seconds;Limitations    Knee: Self-Stretch Limitations  knee drives on 16XW step 5x 10"    Gastroc Stretch  3 reps;30 seconds     Gastroc Stretch Limitations  slant board      Knee/Hip Exercises: Standing   Heel Raises  15 reps;Limitations    Heel Raises Limitations  Toe raises 15 reps    Terminal Knee Extension  Both;10 reps    Terminal Knee Extension Limitations  GTB 5" holds    Rocker Board  2 minutes;Limitations    Rocker Board Limitations  lateral and DF/PF      Knee/Hip Exercises: Supine   Quad Sets  15 reps    Short Arc Quad Sets  15 reps    Heel Slides  15 reps    Heel Slides Limitations  5" holds     Bridges  10 reps    Straight Leg Raises  10 reps;Limitations    Knee Extension  AROM    Knee Extension Limitations  6    Knee Flexion  AROM    Knee Flexion Limitations  107      Manual Therapy   Manual Therapy  Soft tissue mobilization;Myofascial release    Manual therapy comments  completed seperately from all other skilled interventions    Soft tissue mobilization  to improve mobilty    Myofascial Release  to decrease adhesions.               PT Short Term Goals - 02/22/18 1823      PT SHORT TERM GOAL #1   Title  Patient will demonstrate understanding and report regular compliance with HEP.    Time  3    Period  Weeks    Status  New    Target Date  03/15/18      PT SHORT TERM GOAL #2   Title  Patient will demonstrate left knee extension/flexion range of motion of at least 5-100 degrees to assist with more normalized gait pattern and stair ambulation.    Time  3    Period  Weeks    Status  New    Target Date  03/15/18      PT SHORT TERM GOAL #3   Title  Patient will perform single limb stance on left lower extremity for 5 seconds in order to assist with stair ambulation.    Time  3    Period  Weeks    Status  New    Target Date  03/15/18        PT Long Term Goals - 02/22/18 1825      PT LONG TERM GOAL #1  Title  Patient will demonstrate improvement of 1/2 MMT grade in all muscle groups deficient at evaluation on the left lower extremity as evidence of improved strength  to assist with stair ambulation and gait.      Time  6    Period  Weeks    Status  New    Target Date  04/05/18      PT LONG TERM GOAL #2   Title  Patient will improve ROM for left knee extension/flexion to 0-120 degrees to improve ease of gait, sit to stands, squatting, and other functional mobility.    Time  6    Period  Weeks    Status  New    Target Date  04/05/18      PT LONG TERM GOAL #3   Title  Patient will perform 5 times sit to stand in 13 seconds or less as evidence of improved balance and decreased risk of falls.     Time  6    Period  Weeks    Status  New    Target Date  04/05/18      PT LONG TERM GOAL #4   Title  Patient will report ability to ambulate for 20 minutes with no greater than 2/10 pain for improved mobility and return to work.     Time  6    Period  Weeks    Status  New    Target Date  04/05/18            Plan - 02/28/18 1740    Clinical Impression Statement  Continued with focus on ROM this session.  PT with increased irritation over weekend and applied heat to knee.  Explained he was still in acute phase with swelling so to only use ice until calms down.  Noted tightness perimeter of knee/scar line.  Began manual to this area, focusing on medial aspect wtih several adhesions released.  Noted improvement in mobility and eleasticiity of tissue follwoing.  ROM 8-108 today.  Encouaged pt to complete self massage/manipulation at home.      Rehab Potential  Good    Clinical Impairments Affecting Rehab Potential  Positive: highly motivated; positive support system; Negative: comorbidities; financial restrictions     PT Frequency  3x / week    PT Duration  6 weeks    PT Treatment/Interventions  ADLs/Self Care Home Management;Cryotherapy;DME Instruction;Gait training;Stair training;Functional mobility training;Therapeutic activities;Therapeutic exercise;Balance training;Neuromuscular re-education;Patient/family education;Orthotic Fit/Training;Manual  techniques;Compression bandaging;Scar mobilization;Passive range of motion;Energy conservation    PT Next Visit Plan  Continue session focus with knee mobility extension > flexion.  Assess effectiveness of manual next session and continue to loosen adhesions as needed.  Continue to monitor vitals/cardiac symptoms.    PT Home Exercise Plan  Evaluation: Quad sets supine 1 x 15 with 5 second holds 1x/day, Heel slides supine 1 x 15 with 5 second holds 1x/day       Patient will benefit from skilled therapeutic intervention in order to improve the following deficits and impairments:  Abnormal gait, Decreased balance, Decreased endurance, Decreased mobility, Difficulty walking, Hypomobility, Impaired sensation, Decreased range of motion, Decreased scar mobility, Increased edema, Improper body mechanics, Decreased activity tolerance, Decreased strength, Impaired flexibility, Pain  Visit Diagnosis: Muscle weakness (generalized)  Acute pain of left knee  Other abnormalities of gait and mobility     Problem List Patient Active Problem List   Diagnosis Date Noted  . S/P total knee replacement, 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 02/07/2018  . Primary osteoarthritis of  left knee 02/01/2018  . Postural hypotension 07/23/2016  . Dysphagia 07/23/2016  . Obstructive sleep apnea 07/21/2016  . Hyperglycemia 07/21/2016  . Anemia, unspecified 07/21/2016  . Spinal stenosis of lumbar region 07/15/2016  . Bladder tumor 07/27/2015  . HNP (herniated nucleus pulposus), cervical 06/05/2013    Class: Diagnosis of  . 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  . Muscle spasms of neck 04/04/2013  . 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 OA (osteoarthritis) of knee 02/22/2013  . Iliotibial band syndrome of left side 11/22/2012  . Localized, primary osteoarthritis of the ankle and foot 10/11/2012  . Difficulty in walking(719.7) 08/17/2012  . Peroneal tendinitis 08/16/2012  . Precordial pain 02/09/2012  . Essential hypertension,  benign 02/09/2012  . Mixed hyperlipidemia 02/09/2012  . Dysthymia 02/04/2012  . Abnormality of gait 12/23/2011  . Lateral epicondylitis/tennis elbow 05/06/2011  . TENOSYNOVITIS OF FOOT AND ANKLE 10/22/2010   Teena Irani, PTA/CLT [phone]  Teena Irani 02/28/2018, 5:44 PM  [name], [place], [address] Phone: [phone]   Fax:  [phone]  Name: Zachary [name] MRN: [id number] Date of Birth: 1946/04/28

## 2018-03-02 ENCOUNTER — Telehealth (HOSPITAL_COMMUNITY): Payer: Self-pay | Admitting: Internal Medicine

## 2018-03-02 ENCOUNTER — Ambulatory Visit (HOSPITAL_COMMUNITY): Payer: PPO | Admitting: Physical Therapy

## 2018-03-02 NOTE — Telephone Encounter (Signed)
03/02/18  pt called and said he wanted to cx today and see how he gets along for a few days.  He will be back on Monday for his next appt.

## 2018-03-04 DIAGNOSIS — R42 Dizziness and giddiness: Secondary | ICD-10-CM | POA: Diagnosis not present

## 2018-03-04 DIAGNOSIS — R000 Tachycardia, unspecified: Secondary | ICD-10-CM | POA: Diagnosis not present

## 2018-03-04 DIAGNOSIS — G20 Parkinson's disease: Secondary | ICD-10-CM | POA: Diagnosis not present

## 2018-03-07 ENCOUNTER — Ambulatory Visit (HOSPITAL_COMMUNITY): Payer: PPO | Admitting: Physical Therapy

## 2018-03-07 DIAGNOSIS — M25562 Pain in left knee: Secondary | ICD-10-CM

## 2018-03-07 DIAGNOSIS — M6281 Muscle weakness (generalized): Secondary | ICD-10-CM

## 2018-03-07 DIAGNOSIS — R2689 Other abnormalities of gait and mobility: Secondary | ICD-10-CM

## 2018-03-07 NOTE — Therapy (Signed)
[place], [place], [address] Phone: [phone]   Fax:  [phone]  Physical Therapy Treatment  Patient Details  Name: Zachary [name] MRN: [id number] Date of Birth: [birth year] Referring Provider: Arther Abbott, MD   Encounter Date: 03/07/2018  PT End of Session - 03/07/18 1158    Visit Number  5    Number of Visits  19    Date for PT Re-Evaluation  03/15/18    Authorization Type  [employer]    Authorization Time Period  02/22/18 to 04/08/18    PT Start Time  1119    PT Stop Time  1158    PT Time Calculation (min)  39 min    Activity Tolerance  Patient tolerated treatment well;No increased pain    Behavior During Therapy  WFL for tasks assessed/performed       Past Medical History:  Diagnosis Date  . Anxiety    takes Valium as needed  . Arthritis   . Bladder cancer ([place])    takes Rapaflo daily  . Blood dyscrasia    07/08/16: pt told he was a "free bleeder" after bleeding a lot when derm cut off mole on forehead. No excessive bleeding with minor wounds at home, no bleeding problems perioperatively with prior surgeries.  . Chronic back pain    spondylolisthesis  . Cluster headaches   . Depression    takes Lexapro daily  . Essential hypertension, benign    takes Diovan-HCT daily  . Hx of complications due to general anesthesia    Aborted surgery 07/15/2016 due to hypotension per pt  . Joint pain   . Obstructive sleep apnea on CPAP    uses CPAP @ night  . Pneumonia    "several times"--last time about 3-4 yrs ago  . Prediabetes     Past Surgical History:  Procedure Laterality Date  . ANTERIOR CERVICAL DECOMP/DISCECTOMY FUSION N/A 06/05/2013   Procedure:  C3-4 Anterior Cervical Discectomy and Fusion, Allograft, Plate;  Surgeon: [name], MD;  Location: [place] [facility];  Service: Orthopedics;  Laterality: N/A;  C3-4 Anterior Cervical Discectomy and Fusion, Allograft, Plate  . Arthroscopic knee  surgery    . BACK SURGERY      x 2  . BLADDER SURGERY     x 5 to remove tumor  . CARPAL TUNNEL RELEASE    . CATARACT EXTRACTION W/PHACO  12/19/2012   Procedure: CATARACT EXTRACTION PHACO AND INTRAOCULAR LENS PLACEMENT (IOC);  Surgeon: [name], MD;  Location: [facility];  Service: Ophthalmology;  Laterality: Left;  CDE: 26.80  . CATARACT EXTRACTION W/PHACO  01/09/2013   Procedure: CATARACT EXTRACTION PHACO AND INTRAOCULAR LENS PLACEMENT (IOC);  Surgeon: [name], MD;  Location: [facility];  Service: Ophthalmology;  Laterality: Right;  CDE:22.83  . CERVICAL FUSION  06/05/2013   C 3  C4  . CYSTOSCOPY    . REFRACTIVE SURGERY     Hx: of  . RETINAL DETACHMENT SURGERY    . TOTAL KNEE ARTHROPLASTY Left 02/01/2018   Procedure: TOTAL KNEE ARTHROPLASTY;  Surgeon: Carole Civil, MD;  Location: [facility];  Service: Orthopedics;  Laterality: Left;  . TRANSURETHRAL RESECTION OF BLADDER TUMOR Right 07/27/2015   Procedure: TRANSURETHRAL RESECTION OF BLADDER TUMOR (TURBT);  Surgeon: [name], MD;  Location: [facility];  Service: Urology;  Laterality: Right;  . wisdom tooth extraction      There were no vitals filed for this visit.  Subjective Assessment - 03/07/18  1137    Subjective  Pt states he was doing good, even cancelled his appt on Friday.  States he did his exercises and did well Saturday and Sunday and then as he was putting on his sock and shoe, the pain returned in his medial knee.  Currently 7/10.     Currently in Pain?  Yes    Pain Score  7     Pain Location  Knee    Pain Orientation  Left    Pain Descriptors / Indicators  Aching;Tightness                      [facility] Adult PT Treatment/Exercise - 03/07/18 0001      Knee/Hip Exercises: Stretches   Active Hamstring Stretch  3 reps;30 seconds;Limitations    Active Hamstring Stretch Limitations  12in step    Knee: Self-Stretch to increase Flexion  5 reps;10 seconds;Limitations    Knee: Self-Stretch Limitations  knee  drives on 26ST step 5x 10"    Gastroc Stretch  3 reps;30 seconds    Gastroc Stretch Limitations  slant board      Knee/Hip Exercises: Standing   Terminal Knee Extension  Both;10 reps    Terminal Knee Extension Limitations  GTB 5" holds    Rocker Board  2 minutes;Limitations    Rocker Board Limitations  lateral and DF/PF      Knee/Hip Exercises: Seated   Long Arc Quad  Left;15 reps    Sit to [employer]  5 reps;without UE support      Knee/Hip Exercises: Supine   Quad Sets  15 reps    Heel Slides  10 reps    Heel Slides Limitations  5" holds     Knee Extension  AROM    Knee Extension Limitations  5    Knee Flexion  AROM    Knee Flexion Limitations  112      Manual Therapy   Manual Therapy  Myofascial release;Soft tissue mobilization    Manual therapy comments  completed seperately from all other skilled interventions    Soft tissue mobilization  to improve mobilty    Myofascial Release  to decrease adhesions.               PT Short Term Goals - 02/22/18 1823      PT SHORT TERM GOAL #1   Title  Patient will demonstrate understanding and report regular compliance with HEP.    Time  3    Period  Weeks    Status  New    Target Date  03/15/18      PT SHORT TERM GOAL #2   Title  Patient will demonstrate left knee extension/flexion range of motion of at least 5-100 degrees to assist with more normalized gait pattern and stair ambulation.    Time  3    Period  Weeks    Status  New    Target Date  03/15/18      PT SHORT TERM GOAL #3   Title  Patient will perform single limb stance on left lower extremity for 5 seconds in order to assist with stair ambulation.    Time  3    Period  Weeks    Status  New    Target Date  03/15/18        PT Long Term Goals - 02/22/18 1825      PT LONG TERM GOAL #1   Title  Patient will demonstrate improvement of  1/2 MMT grade in all muscle groups deficient at evaluation on the left lower extremity as evidence of improved strength to  assist with stair ambulation and gait.      Time  6    Period  Weeks    Status  New    Target Date  04/05/18      PT LONG TERM GOAL #2   Title  Patient will improve ROM for left knee extension/flexion to 0-120 degrees to improve ease of gait, sit to stands, squatting, and other functional mobility.    Time  6    Period  Weeks    Status  New    Target Date  04/05/18      PT LONG TERM GOAL #3   Title  Patient will perform 5 times sit to stand in 13 seconds or less as evidence of improved balance and decreased risk of falls.     Time  6    Period  Weeks    Status  New    Target Date  04/05/18      PT LONG TERM GOAL #4   Title  Patient will report ability to ambulate for 20 minutes with no greater than 2/10 pain for improved mobility and return to work.     Time  6    Period  Weeks    Status  New    Target Date  04/05/18            Plan - 03/07/18 1158    Clinical Impression Statement  PT with noted frustration with return of pain this morning in knee.  Explained to patient pain will come and go as well as tightness in his knee.  Pt verbalized understanding.  Did not add any additional exercises this session due to increased pain other than working on Sit to stands at [employer].   Completed myofascial techiques with massive tightness in proximal scar and below patellar region.   Able to loosen all adhesions and improve mobility gaining ROM in both directions with 5-112 at EOS.  Encouraged to continue icing, complete self manual and therex at home.      Rehab Potential  Good    Clinical Impairments Affecting Rehab Potential  Positive: highly motivated; positive support system; Negative: comorbidities; financial restrictions     PT Frequency  3x / week    PT Duration  6 weeks    PT Treatment/Interventions  ADLs/Self Care Home Management;Cryotherapy;DME Instruction;Gait training;Stair training;Functional mobility training;Therapeutic activities;Therapeutic exercise;Balance  training;Neuromuscular re-education;Patient/family education;Orthotic Fit/Training;Manual techniques;Compression bandaging;Scar mobilization;Passive range of motion;Energy conservation    PT Next Visit Plan  Continue session focus with knee mobility extension > flexion.  Contiue to progress as able with addition of lunges and steps next session if pain is lower.     PT Home Exercise Plan  Evaluation: Quad sets supine 1 x 15 with 5 second holds 1x/day, Heel slides supine 1 x 15 with 5 second holds 1x/day       Patient will benefit from skilled therapeutic intervention in order to improve the following deficits and impairments:  Abnormal gait, Decreased balance, Decreased endurance, Decreased mobility, Difficulty walking, Hypomobility, Impaired sensation, Decreased range of motion, Decreased scar mobility, Increased edema, Improper body mechanics, Decreased activity tolerance, Decreased strength, Impaired flexibility, Pain  Visit Diagnosis: Muscle weakness (generalized)  Acute pain of left knee  Other abnormalities of gait and mobility     Problem List Patient Active Problem List   Diagnosis Date Noted  . S/P  total knee replacement, 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 02/07/2018  . Primary osteoarthritis of left knee 02/01/2018  . Postural hypotension 07/23/2016  . Dysphagia 07/23/2016  . Obstructive sleep apnea 07/21/2016  . Hyperglycemia 07/21/2016  . Anemia, unspecified 07/21/2016  . Spinal stenosis of lumbar region 07/15/2016  . Bladder tumor 07/27/2015  . HNP (herniated nucleus pulposus), cervical 06/05/2013    Class: Diagnosis of  . 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  . Muscle spasms of neck 04/04/2013  . 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 OA (osteoarthritis) of knee 02/22/2013  . Iliotibial band syndrome of left side 11/22/2012  . Localized, primary osteoarthritis of the ankle and foot 10/11/2012  . Difficulty in walking(719.7) 08/17/2012  . Peroneal tendinitis 08/16/2012  .  Precordial pain 02/09/2012  . Essential hypertension, benign 02/09/2012  . Mixed hyperlipidemia 02/09/2012  . Dysthymia 02/04/2012  . Abnormality of gait 12/23/2011  . Lateral epicondylitis/tennis elbow 05/06/2011  . TENOSYNOVITIS OF FOOT AND ANKLE 10/22/2010   Teena Irani, PTA/CLT [phone]  Teena Irani 03/07/2018, 12:05 PM  [place], [place], [address] Phone: [phone]   Fax:  [phone]  Name: Zachary [name] MRN: [id number] Date of Birth: 01-07-1946

## 2018-03-09 ENCOUNTER — Encounter (HOSPITAL_COMMUNITY): Payer: Self-pay

## 2018-03-09 ENCOUNTER — Ambulatory Visit (HOSPITAL_COMMUNITY): Payer: PPO

## 2018-03-09 DIAGNOSIS — M6281 Muscle weakness (generalized): Secondary | ICD-10-CM | POA: Diagnosis not present

## 2018-03-09 DIAGNOSIS — R2689 Other abnormalities of gait and mobility: Secondary | ICD-10-CM

## 2018-03-09 DIAGNOSIS — R29898 Other symptoms and signs involving the musculoskeletal system: Secondary | ICD-10-CM

## 2018-03-09 DIAGNOSIS — M25562 Pain in left knee: Secondary | ICD-10-CM

## 2018-03-09 NOTE — Therapy (Signed)
[place], [place], [address] Phone: [phone]   Fax:  [phone]  Physical Therapy Treatment  Patient Details  Name: Zachary [name] MRN: [id number] Date of Birth: 12-22-1946 Referring Provider: Arther Abbott, MD   Encounter Date: 03/09/2018  PT End of Session - 03/09/18 1123    Visit Number  6    Number of Visits  19    Date for PT Re-Evaluation  04/08/18 minireassess 3/19    Authorization Type  [employer]    Authorization Time Period  02/22/18 to 04/08/18    PT Start Time  1119    PT Stop Time  1200    PT Time Calculation (min)  41 min    Activity Tolerance  Patient tolerated treatment well;No increased pain    Behavior During Therapy  WFL for tasks assessed/performed       Past Medical History:  Diagnosis Date  . Anxiety    takes Valium as needed  . Arthritis   . Bladder cancer ([place])    takes Rapaflo daily  . Blood dyscrasia    07/08/16: pt told he was a "free bleeder" after bleeding a lot when derm cut off mole on forehead. No excessive bleeding with minor wounds at home, no bleeding problems perioperatively with prior surgeries.  . Chronic back pain    spondylolisthesis  . Cluster headaches   . Depression    takes Lexapro daily  . Essential hypertension, benign    takes Diovan-HCT daily  . Hx of complications due to general anesthesia    Aborted surgery 07/15/2016 due to hypotension per pt  . Joint pain   . Obstructive sleep apnea on CPAP    uses CPAP @ night  . Pneumonia    "several times"--last time about 3-4 yrs ago  . Prediabetes     Past Surgical History:  Procedure Laterality Date  . ANTERIOR CERVICAL DECOMP/DISCECTOMY FUSION N/A 06/05/2013   Procedure:  C3-4 Anterior Cervical Discectomy and Fusion, Allograft, Plate;  Surgeon: [name], MD;  Location: [facility];  Service: Orthopedics;  Laterality: N/A;  C3-4 Anterior Cervical Discectomy and Fusion, Allograft, Plate  .  Arthroscopic knee surgery    . BACK SURGERY      x 2  . BLADDER SURGERY     x 5 to remove tumor  . CARPAL TUNNEL RELEASE    . CATARACT EXTRACTION W/PHACO  12/19/2012   Procedure: CATARACT EXTRACTION PHACO AND INTRAOCULAR LENS PLACEMENT (IOC);  Surgeon: [name], MD;  Location: [facility];  Service: Ophthalmology;  Laterality: Left;  CDE: 26.80  . CATARACT EXTRACTION W/PHACO  01/09/2013   Procedure: CATARACT EXTRACTION PHACO AND INTRAOCULAR LENS PLACEMENT (IOC);  Surgeon: [name], MD;  Location: [facility];  Service: Ophthalmology;  Laterality: Right;  CDE:22.83  . CERVICAL FUSION  06/05/2013   C 3  C4  . CYSTOSCOPY    . REFRACTIVE SURGERY     Hx: of  . RETINAL DETACHMENT SURGERY    . TOTAL KNEE ARTHROPLASTY Left 02/01/2018   Procedure: TOTAL KNEE ARTHROPLASTY;  Surgeon: Carole Civil, MD;  Location: [facility];  Service: Orthopedics;  Laterality: Left;  . TRANSURETHRAL RESECTION OF BLADDER TUMOR Right 07/27/2015   Procedure: TRANSURETHRAL RESECTION OF BLADDER TUMOR (TURBT);  Surgeon: [name], MD;  Location: [facility];  Service: Urology;  Laterality: Right;  . wisdom tooth extraction      There were no vitals filed for this visit.  Subjective Assessment -  03/09/18 1120    Subjective  Pt stated knee is feeling good today, reports he walked 1/2 mile following last session.  Reports he feels his apt on Friday to be the last PT session, feels he can handle it from there.      Pertinent History  S/P Left TKA 02/01/18; Spinal fusion 2 years ago; HTN    Patient Stated Goals  To not let his knee stiffen up; to not have any pain in the left knee    Currently in Pain?  No/denies                      [facility] Adult PT Treatment/Exercise - 03/09/18 0001      Knee/Hip Exercises: Stretches   Active Hamstring Stretch  3 reps;30 seconds;Limitations    Active Hamstring Stretch Limitations  12in step    Knee: Self-Stretch to increase Flexion  5 reps;10 seconds;Limitations    Knee:  Self-Stretch Limitations  knee drives on 07PX step 5x 10"    Gastroc Stretch  3 reps;30 seconds    Gastroc Stretch Limitations  slant board      Knee/Hip Exercises: Standing   Forward Lunges  Both;15 reps on floor    Terminal Knee Extension  10 reps;Left    Terminal Knee Extension Limitations  GTB 5" holds    Lateral Step Up  Left;10 reps;Hand Hold: 1;Step Height: 4"    Forward Step Up  Left;15 reps;Hand Hold: 1;Step Height: 4";Step Height: 6"    Functional Squat  10 reps    SLS  Bil 5" 5x       Knee/Hip Exercises: Supine   Knee Extension  AROM    Knee Extension Limitations  4    Knee Flexion  AROM    Knee Flexion Limitations  114      Manual Therapy   Manual Therapy  Myofascial release;Soft tissue mobilization    Manual therapy comments  completed seperately from all other skilled interventions    Soft tissue mobilization  to improve mobilty    Myofascial Release  to decrease adhesions.               PT Short Term Goals - 02/22/18 1823      PT SHORT TERM GOAL #1   Title  Patient will demonstrate understanding and report regular compliance with HEP.    Time  3    Period  Weeks    Status  New    Target Date  03/15/18      PT SHORT TERM GOAL #2   Title  Patient will demonstrate left knee extension/flexion range of motion of at least 5-100 degrees to assist with more normalized gait pattern and stair ambulation.    Time  3    Period  Weeks    Status  New    Target Date  03/15/18      PT SHORT TERM GOAL #3   Title  Patient will perform single limb stance on left lower extremity for 5 seconds in order to assist with stair ambulation.    Time  3    Period  Weeks    Status  New    Target Date  03/15/18        PT Long Term Goals - 02/22/18 1825      PT LONG TERM GOAL #1   Title  Patient will demonstrate improvement of 1/2 MMT grade in all muscle groups deficient at evaluation on the left lower extremity  as evidence of improved strength to assist with stair  ambulation and gait.      Time  6    Period  Weeks    Status  New    Target Date  04/05/18      PT LONG TERM GOAL #2   Title  Patient will improve ROM for left knee extension/flexion to 0-120 degrees to improve ease of gait, sit to stands, squatting, and other functional mobility.    Time  6    Period  Weeks    Status  New    Target Date  04/05/18      PT LONG TERM GOAL #3   Title  Patient will perform 5 times sit to stand in 13 seconds or less as evidence of improved balance and decreased risk of falls.     Time  6    Period  Weeks    Status  New    Target Date  04/05/18      PT LONG TERM GOAL #4   Title  Patient will report ability to ambulate for 20 minutes with no greater than 2/10 pain for improved mobility and return to work.     Time  6    Period  Weeks    Status  New    Target Date  04/05/18            Plan - 03/09/18 1207    Clinical Impression Statement  Continued session focus iwth knee mobility and progressed functional strengthening.  Began step up training and lunges for functional strengthening as well as SLS to address balance.  Pt able to demonstrate appropriate mechanics with minimal difficulty with new exercises.  Impaired balance noted wiht 5" max with SLS.  End of session with manual myofascial techniques to reduce tightness in proximal and distal scar tissue and patella mobility.  AROM 4-114 degrees at EOS.  Pt reports some concern with finances and may request D/C to HEP next session, stated he would talk to wife and let us know Friday.      Rehab Potential  Good    Clinical Impairments Affecting Rehab Potential  Positive: highly motivated; positive support system; Negative: comorbidities; financial restrictions     PT Frequency  3x / week    PT Duration  6 weeks    PT Treatment/Interventions  ADLs/Self Care Home Management;Cryotherapy;DME Instruction;Gait training;Stair training;Functional mobility training;Therapeutic activities;Therapeutic  exercise;Balance training;Neuromuscular re-education;Patient/family education;Orthotic Fit/Training;Manual techniques;Compression bandaging;Scar mobilization;Passive range of motion;Energy conservation    PT Next Visit Plan  Continue session focus wiht knee mobility extension>flexion.  Progress functional strengthening with step down next session and balance activities.  Pt may request D/C due to financial concerns.      PT Home Exercise Plan  Evaluation: Quad sets supine 1 x 15 with 5 second holds 1x/day, Heel slides supine 1 x 15 with 5 second holds 1x/day       Patient will benefit from skilled therapeutic intervention in order to improve the following deficits and impairments:  Abnormal gait, Decreased balance, Decreased endurance, Decreased mobility, Difficulty walking, Hypomobility, Impaired sensation, Decreased range of motion, Decreased scar mobility, Increased edema, Improper body mechanics, Decreased activity tolerance, Decreased strength, Impaired flexibility, Pain  Visit Diagnosis: Muscle weakness (generalized)  Acute pain of left knee  Other abnormalities of gait and mobility  Other symptoms and signs involving the musculoskeletal system     Problem List Patient Active Problem List   Diagnosis Date Noted  . S/P total knee replacement,  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 02/07/2018  . Primary osteoarthritis of left knee 02/01/2018  . Postural hypotension 07/23/2016  . Dysphagia 07/23/2016  . Obstructive sleep apnea 07/21/2016  . Hyperglycemia 07/21/2016  . Anemia, unspecified 07/21/2016  . Spinal stenosis of lumbar region 07/15/2016  . Bladder tumor 07/27/2015  . HNP (herniated nucleus pulposus), cervical 06/05/2013    Class: Diagnosis of  . 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  . Muscle spasms of neck 04/04/2013  . 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 OA (osteoarthritis) of knee 02/22/2013  . Iliotibial band syndrome of left side 11/22/2012  . Localized, primary osteoarthritis  of the ankle and foot 10/11/2012  . Difficulty in walking(719.7) 08/17/2012  . Peroneal tendinitis 08/16/2012  . Precordial pain 02/09/2012  . Essential hypertension, benign 02/09/2012  . Mixed hyperlipidemia 02/09/2012  . Dysthymia 02/04/2012  . Abnormality of gait 12/23/2011  . Lateral epicondylitis/tennis elbow 05/06/2011  . TENOSYNOVITIS OF FOOT AND ANKLE 10/22/2010   Ihor Austin, LPTA; CBIS [phone]  Aldona Lento 03/09/2018, 12:16 PM  [place], [place], [address] Phone: [phone]   Fax:  [phone]  Name: RAKESH DUTKO MRN: [id number] Date of Birth: October 17, 1946

## 2018-03-11 ENCOUNTER — Encounter (HOSPITAL_COMMUNITY): Payer: Self-pay

## 2018-03-11 ENCOUNTER — Ambulatory Visit (HOSPITAL_COMMUNITY): Payer: PPO

## 2018-03-11 DIAGNOSIS — M6281 Muscle weakness (generalized): Secondary | ICD-10-CM | POA: Diagnosis not present

## 2018-03-11 DIAGNOSIS — M25562 Pain in left knee: Secondary | ICD-10-CM

## 2018-03-11 DIAGNOSIS — R2689 Other abnormalities of gait and mobility: Secondary | ICD-10-CM

## 2018-03-11 DIAGNOSIS — R29898 Other symptoms and signs involving the musculoskeletal system: Secondary | ICD-10-CM

## 2018-03-11 NOTE — Therapy (Signed)
[place], [place], [address] Phone: [phone]   Fax:  [phone]  Physical Therapy Treatment/Reassessment  Patient Details  Name: Zachary [name] MRN: [id number] Date of Birth: 08-Jun-1946 Referring Provider: Arther Abbott, MD   Encounter Date: 03/11/2018  PT End of Session - 03/11/18 1126    Visit Number  7    Number of Visits  19    Date for PT Re-Evaluation  04/08/18    Authorization Type  [employer]    Authorization Time Period  02/22/18 to 04/08/18    PT Start Time  1120    PT Stop Time  1205    PT Time Calculation (min)  45 min    Activity Tolerance  Patient tolerated treatment well;No increased pain    Behavior During Therapy  WFL for tasks assessed/performed       Past Medical History:  Diagnosis Date  . Anxiety    takes Valium as needed  . Arthritis   . Bladder cancer ([place])    takes Rapaflo daily  . Blood dyscrasia    07/08/16: pt told he was a "free bleeder" after bleeding a lot when derm cut off mole on forehead. No excessive bleeding with minor wounds at home, no bleeding problems perioperatively with prior surgeries.  . Chronic back pain    spondylolisthesis  . Cluster headaches   . Depression    takes Lexapro daily  . Essential hypertension, benign    takes Diovan-HCT daily  . Hx of complications due to general anesthesia    Aborted surgery 07/15/2016 due to hypotension per pt  . Joint pain   . Obstructive sleep apnea on CPAP    uses CPAP @ night  . Pneumonia    "several times"--last time about 3-4 yrs ago  . Prediabetes     Past Surgical History:  Procedure Laterality Date  . ANTERIOR CERVICAL DECOMP/DISCECTOMY FUSION N/A 06/05/2013   Procedure:  C3-4 Anterior Cervical Discectomy and Fusion, Allograft, Plate;  Surgeon: [name], MD;  Location: [facility];  Service: Orthopedics;  Laterality: N/A;  C3-4 Anterior Cervical Discectomy and Fusion, Allograft, Plate  .  Arthroscopic knee surgery    . BACK SURGERY      x 2  . BLADDER SURGERY     x 5 to remove tumor  . CARPAL TUNNEL RELEASE    . CATARACT EXTRACTION W/PHACO  12/19/2012   Procedure: CATARACT EXTRACTION PHACO AND INTRAOCULAR LENS PLACEMENT (IOC);  Surgeon: [name], MD;  [facility]: [facility];  Service: Ophthalmology;  Laterality: Left;  CDE: 26.80  . CATARACT EXTRACTION W/PHACO  01/09/2013   Procedure: CATARACT EXTRACTION PHACO AND INTRAOCULAR LENS PLACEMENT (IOC);  Surgeon: [name], MD;  Location: [facility];  Service: Ophthalmology;  Laterality: Right;  CDE:22.83  . CERVICAL FUSION  06/05/2013   C 3  C4  . CYSTOSCOPY    . REFRACTIVE SURGERY     Hx: of  . RETINAL DETACHMENT SURGERY    . TOTAL KNEE ARTHROPLASTY Left 02/01/2018   Procedure: TOTAL KNEE ARTHROPLASTY;  Surgeon: Carole Civil, MD;  Location: [facility];  Service: Orthopedics;  Laterality: Left;  . TRANSURETHRAL RESECTION OF BLADDER TUMOR Right 07/27/2015   Procedure: TRANSURETHRAL RESECTION OF BLADDER TUMOR (TURBT);  Surgeon: [name], MD;  [facility];  Service: Urology;  Laterality: Right;  . wisdom tooth extraction      There were no vitals filed for this visit.  Subjective Assessment - 03/11/18  1124    Subjective  Pt stating that his L knee is feeling "loose" this morning. When asked if it feels unstable he stated yes. This started this morning. He said that he walked a 1/2 mile after his therapy session on Wednesday and then he had increased soreness yesterday. He massaged it really good yesterday evening and then he had the best night of sleep he's had.     Pertinent History  S/P Left TKA 02/01/18; Spinal fusion 2 years ago; HTN    Patient Stated Goals  To not let his knee stiffen up; to not have any pain in the left knee    Currently in Pain?  Yes    Pain Score  2     Pain Location  Knee    Pain Orientation  Left    Pain Descriptors / Indicators  Aching;Tightness         [facility] PT Assessment - 03/11/18 0001       Assessment   Medical Diagnosis  S/P total knee replacement, left    Referring Provider  Arther Abbott, MD    Next MD Visit  03/16/18      Circumferential Edema   Circumferential - Left   16" joint line      AROM   AROM Assessment Site  Knee    Right/Left Knee  Left    Left Knee Extension  5 was 18    Left Knee Flexion  108 was 91      Strength   Right Hip Extension  4/5 was 2+    Left Hip Flexion  5/5 was 4+    Left Hip Extension  4/5 was 2    Left Hip ABduction  4+/5 was 4+    Left Knee Flexion  5/5 was 4+    Left Knee Extension  5/5 was 4+    Left Ankle Dorsiflexion  5/5 was 4+      Static Standing Balance   Static Standing - Balance Support  No upper extremity supported    Static Standing Balance -  Activities   Single Leg Stance - Left Leg    Static Standing - Comment/# of Minutes  L: 5.05 sec but unsteady was 2 sec on LLE      Standardized Balance Assessment   Standardized Balance Assessment  Five Times Sit to Stand    Five times sit to stand comments   10.9 sec, chair, no UE, slightly off shifted of LLE was 17sec            PT Education - 03/11/18 1126    Education provided  Yes    Education Details  reassessment findings, POC, updated HEP    Person(s) Educated  Patient    Methods  Explanation;Demonstration;Handout    Comprehension  Verbalized understanding;Returned demonstration       PT Short Term Goals - 03/11/18 1127      PT SHORT TERM GOAL #1   Title  Patient will demonstrate understanding and report regular compliance with HEP.    Time  3    Period  Weeks    Status  Achieved      PT SHORT TERM GOAL #2   Title  Patient will demonstrate left knee extension/flexion range of motion of at least 5-100 degrees to assist with more normalized gait pattern and stair ambulation.    Baseline  3/15: 4-112deg     Time  3    Period  Weeks    Status  New      PT SHORT TERM GOAL #3   Title  Patient will perform single limb stance on left lower  extremity for 5 seconds in order to assist with stair ambulation.    Baseline  3/15: 5.05 sec, but unsteady    Time  3    Period  Weeks    Status  Partially Met        PT Long Term Goals - 03/11/18 1127      PT LONG TERM GOAL #1   Title  Patient will demonstrate improvement of 1/2 MMT grade in all muscle groups deficient at evaluation on the left lower extremity as evidence of improved strength to assist with stair ambulation and gait.      Baseline  3/15: see MMT    Time  6    Period  Weeks    Status  Partially Met      PT LONG TERM GOAL #2   Title  Patient will improve ROM for left knee extension/flexion to 0-120 degrees to improve ease of gait, sit to stands, squatting, and other functional mobility.    Baseline  3/15: 4-112deg    Time  6    Period  Weeks    Status  On-going      PT LONG TERM GOAL #3   Title  Patient will perform 5 times sit to stand in 13 seconds or less as evidence of improved balance and decreased risk of falls.     Baseline  3/15: 10.9 sec    Time  6    Period  Weeks    Status  Achieved      PT LONG TERM GOAL #4   Title  Patient will report ability to ambulate for 20 minutes with no greater than 2/10 pain for improved mobility and return to work.     Baseline  3/15: walked 1/2 mile with no pain, but did have increased soreness following; states he can walk for 20 mins with 2/10 pain or <    Time  6    Period  Weeks    Status  Achieved            Plan - 03/11/18 1218    Clinical Impression Statement  PT reassessed pt's goals this date as he stated during last session that today would be his last day, however, upon entry into clinic, pt stating that he may want to just decrease to 1x/week. He stated that his knee felt "looser" and he initially stated that it felt unstable; but, by the end of session, PT finally able to discern that pt meant his knee was easier to move and he didn't have to struggle/work as hard to get it going this morning.  Overall, pt has made great progress thus far. His AROM was 4-112deg this date (was 4-114deg last session), his MMT has grossly improved throughout, and his balance is steadily improving. Pt verbalizing that financial reasons is why he would like to decrease his frequency. PT educated pt that he would benefit from continued therapy to continue to promote ROM and progress overall strength but that if we decrease to 1x/week, he will have to be even more diligent about his HEP and he verbalized understanding. Pt f/u with Dr. Aline Brochure next Wednesday, 03/16/18, so pt will f/u with therapy on Friday after he sees Dr. Aline Brochure. Will continue to update and determine POC going forward. Updated pt's HEP this date. Decreased pt's to 1x/week for 4 more  weeks.    Rehab Potential  Good    Clinical Impairments Affecting Rehab Potential  Positive: highly motivated; positive support system; Negative: comorbidities; financial restrictions     PT Frequency  1x / week    PT Duration  4 weeks    PT Treatment/Interventions  ADLs/Self Care Home Management;Cryotherapy;DME Instruction;Gait training;Stair training;Functional mobility training;Therapeutic activities;Therapeutic exercise;Balance training;Neuromuscular re-education;Patient/family education;Orthotic Fit/Training;Manual techniques;Compression bandaging;Scar mobilization;Passive range of motion;Energy conservation    PT Next Visit Plan  Continue session focus wiht knee mobility extension>flexion.  Progress functional strengthening with step down next session and balance activities.  Pt may request D/C due to financial concerns.      PT Home Exercise Plan  Evaluation: Quad sets supine 1 x 15 with 5 second holds 1x/day, Heel slides supine 1 x 15 with 5 second holds 1x/day; 3/15: SLS, standing knee drives on step, standing heel raises, prone quad stretch    Consulted and Agree with Plan of Care  Patient       Patient will benefit from skilled therapeutic intervention in  order to improve the following deficits and impairments:  Abnormal gait, Decreased balance, Decreased endurance, Decreased mobility, Difficulty walking, Hypomobility, Impaired sensation, Decreased range of motion, Decreased scar mobility, Increased edema, Improper body mechanics, Decreased activity tolerance, Decreased strength, Impaired flexibility, Pain  Visit Diagnosis: Muscle weakness (generalized)  Acute pain of left knee  Other abnormalities of gait and mobility  Other symptoms and signs involving the musculoskeletal system     Problem List Patient Active Problem List   Diagnosis Date Noted  . S/P total knee replacement, 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 02/07/2018  . Primary osteoarthritis of left knee 02/01/2018  . Postural hypotension 07/23/2016  . Dysphagia 07/23/2016  . Obstructive sleep apnea 07/21/2016  . Hyperglycemia 07/21/2016  . Anemia, unspecified 07/21/2016  . Spinal stenosis of lumbar region 07/15/2016  . Bladder tumor 07/27/2015  . HNP (herniated nucleus pulposus), cervical 06/05/2013    Class: Diagnosis of  . 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  . Muscle spasms of neck 04/04/2013  . 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 OA (osteoarthritis) of knee 02/22/2013  . Iliotibial band syndrome of left side 11/22/2012  . Localized, primary osteoarthritis of the ankle and foot 10/11/2012  . Difficulty in walking(719.7) 08/17/2012  . Peroneal tendinitis 08/16/2012  . Precordial pain 02/09/2012  . Essential hypertension, benign 02/09/2012  . Mixed hyperlipidemia 02/09/2012  . Dysthymia 02/04/2012  . Abnormality of gait 12/23/2011  . Lateral epicondylitis/tennis elbow 05/06/2011  . TENOSYNOVITIS OF FOOT AND ANKLE 10/22/2010       Geraldine Solar PT, DPT  [facility] [address] [place], [place], [address] Phone: [phone]   Fax:  [phone]  Name: Zachary [name] MRN: [id number] Date of Birth: [birth year]

## 2018-03-11 NOTE — Patient Instructions (Signed)
  SINGLE LEG STANCE - SLS  Stand on one leg and maintain your balance.  Perform 1x/day, 2-3 sets of 10 reps holding for as long as you can.    Prone Quad Stretch  Lie down flat on your stomach. Wrap a strap (belt, towel, dog leash) around the top of one of your feet and pull the strap across your opposite shoulder so that your knee starts to curl up to your body. Pull until a stretch is felt across the front of your thigh.     Perform 1x/day, 3-5 stretches holding for 30-60 seconds   Knee Flexion Stretch on Step  Place foot on step and lean forward until you feel a good stretch in front of knee.   Perform 1x/day, 2-3 sets of 10-15 reps holding for 10-15 seconds   STANDING HEEL RAISES  While standing, raise up on your toes as you lift your heels off the ground.  Perform 1x/day, 2-3 sets of 10-15 reps each

## 2018-03-14 ENCOUNTER — Encounter (HOSPITAL_COMMUNITY): Payer: Self-pay | Admitting: Physical Therapy

## 2018-03-15 DIAGNOSIS — J019 Acute sinusitis, unspecified: Secondary | ICD-10-CM | POA: Diagnosis not present

## 2018-03-15 DIAGNOSIS — Z6832 Body mass index (BMI) 32.0-32.9, adult: Secondary | ICD-10-CM | POA: Diagnosis not present

## 2018-03-16 ENCOUNTER — Ambulatory Visit (HOSPITAL_COMMUNITY): Payer: Self-pay | Admitting: Physical Therapy

## 2018-03-16 ENCOUNTER — Encounter: Payer: Self-pay | Admitting: Orthopedic Surgery

## 2018-03-16 ENCOUNTER — Ambulatory Visit (INDEPENDENT_AMBULATORY_CARE_PROVIDER_SITE_OTHER): Payer: Self-pay | Admitting: Orthopedic Surgery

## 2018-03-16 VITALS — BP 151/86 | HR 109 | Ht 72.0 in | Wt 224.0 lb

## 2018-03-16 DIAGNOSIS — Z96652 Presence of left artificial knee joint: Secondary | ICD-10-CM

## 2018-03-16 NOTE — Progress Notes (Signed)
POST OP VISIT   Patient ID: Zachary [name], male   DOB: 06/18/1946, 72 y.o.   MRN: [id number]  Chief Complaint  Patient presents with  . Post-op Follow-up    left total knee replacement 02/01/18    Encounter Diagnosis  Name Primary?  . S/P total knee replacement, 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 Yes    Zachary [name] is doing well with his left total knee is making progress he is walking without a cane he still had a little bit of extension deficit but that is expected with a severe extension deficit he had prior to surgery his flexion is progressing well he will continue with therapy and follow-up in 6 weeks

## 2018-03-18 ENCOUNTER — Encounter (HOSPITAL_COMMUNITY): Payer: Self-pay | Admitting: Physical Therapy

## 2018-03-18 ENCOUNTER — Ambulatory Visit (HOSPITAL_COMMUNITY): Payer: PPO | Admitting: Physical Therapy

## 2018-03-18 DIAGNOSIS — R2689 Other abnormalities of gait and mobility: Secondary | ICD-10-CM

## 2018-03-18 DIAGNOSIS — M6281 Muscle weakness (generalized): Secondary | ICD-10-CM | POA: Diagnosis not present

## 2018-03-18 DIAGNOSIS — R29898 Other symptoms and signs involving the musculoskeletal system: Secondary | ICD-10-CM

## 2018-03-18 DIAGNOSIS — M25562 Pain in left knee: Secondary | ICD-10-CM

## 2018-03-18 NOTE — Therapy (Signed)
[place], [place], [address] Phone: [phone]   Fax:  [phone]  Physical Therapy Treatment  Patient Details  Name: Zachary [name] MRN: [id number] Date of Birth: 10-23-1946 Referring Provider: Arther Abbott, MD   Encounter Date: 03/18/2018  PT End of Session - 03/18/18 1122    Visit Number  8    Number of Visits  19    Date for PT Re-Evaluation  04/08/18    Authorization Type  [employer]    Authorization Time Period  02/22/18 to 04/08/18    PT Start Time  1115    PT Stop Time  1159    PT Time Calculation (min)  44 min    Activity Tolerance  Patient tolerated treatment well;No increased pain    Behavior During Therapy  WFL for tasks assessed/performed       Past Medical History:  Diagnosis Date  . Anxiety    takes Valium as needed  . Arthritis   . Bladder cancer ([place])    takes Rapaflo daily  . Blood dyscrasia    07/08/16: pt told he was a "free bleeder" after bleeding a lot when derm cut off mole on forehead. No excessive bleeding with minor wounds at home, no bleeding problems perioperatively with prior surgeries.  . Chronic back pain    spondylolisthesis  . Cluster headaches   . Depression    takes Lexapro daily  . Essential hypertension, benign    takes Diovan-HCT daily  . Hx of complications due to general anesthesia    Aborted surgery 07/15/2016 due to hypotension per pt  . Joint pain   . Obstructive sleep apnea on CPAP    uses CPAP @ night  . Pneumonia    "several times"--last time about 3-4 yrs ago  . Prediabetes     Past Surgical History:  Procedure Laterality Date  . ANTERIOR CERVICAL DECOMP/DISCECTOMY FUSION N/A 06/05/2013   Procedure:  C3-4 Anterior Cervical Discectomy and Fusion, Allograft, Plate;  Surgeon: [name], MD;  Location: [facility];  Service: Orthopedics;  Laterality: N/A;  C3-4 Anterior Cervical Discectomy and Fusion, Allograft, Plate  . Arthroscopic knee  surgery    . BACK SURGERY      x 2  . BLADDER SURGERY     x 5 to remove tumor  . CARPAL TUNNEL RELEASE    . CATARACT EXTRACTION W/PHACO  12/19/2012   Procedure: CATARACT EXTRACTION PHACO AND INTRAOCULAR LENS PLACEMENT (IOC);  Surgeon: [name], MD;  Location: [facility];  Service: Ophthalmology;  Laterality: Left;  CDE: 26.80  . CATARACT EXTRACTION W/PHACO  01/09/2013   Procedure: CATARACT EXTRACTION PHACO AND INTRAOCULAR LENS PLACEMENT (IOC);  Surgeon: [name], MD;  Location: [facility];  Service: Ophthalmology;  Laterality: Right;  CDE:22.83  . CERVICAL FUSION  06/05/2013   C 3  C4  . CYSTOSCOPY    . REFRACTIVE SURGERY     Hx: of  . RETINAL DETACHMENT SURGERY    . TOTAL KNEE ARTHROPLASTY Left 02/01/2018   Procedure: TOTAL KNEE ARTHROPLASTY;  Surgeon: Carole Civil, MD;  Location: [facility];  Service: Orthopedics;  Laterality: Left;  . TRANSURETHRAL RESECTION OF BLADDER TUMOR Right 07/27/2015   Procedure: TRANSURETHRAL RESECTION OF BLADDER TUMOR (TURBT);  Surgeon: [name], MD;  Location: [facility];  Service: Urology;  Laterality: Right;  . wisdom tooth extraction      There were no vitals filed for this visit.  Subjective Assessment - 03/18/18  1119    Subjective  Patient stated that he hasn't been able to do his home exercises because he hasn't been feeling good due to a sinus infection. Patient stated he would like to continue with therapy at this time.     Pertinent History  S/P Left TKA 02/01/18; Spinal fusion 2 years ago; HTN    Patient Stated Goals  To not let his knee stiffen up; to not have any pain in the left knee    Currently in Pain?  Yes    Pain Score  3     Pain Location  Knee    Pain Orientation  Left    Pain Descriptors / Indicators  Aching;Tightness                No data recorded       [facility] Adult PT Treatment/Exercise - 03/18/18 0001      Knee/Hip Exercises: Stretches   Passive Hamstring Stretch  3 reps;30 seconds    Passive Hamstring  Stretch Limitations  On 12 inch step left lower extremity    Knee: Self-Stretch to increase Flexion  5 reps;10 seconds;Limitations    Knee: Self-Stretch Limitations  knee drives on 64PP step 5x 10"    Gastroc Stretch  3 reps;30 seconds    Gastroc Stretch Limitations  slant board      Knee/Hip Exercises: Standing   Forward Lunges  Right;Left;15 reps;Other (comment) single upper extremity support    Terminal Knee Extension  10 reps;Left    Terminal Knee Extension Limitations  GTB 5" holds    Lateral Step Up  Left;Step Height: 4";15 reps;Hand Hold: 0    Forward Step Up  Left;15 reps;Hand Hold: 1;Step Height: 6"    Step Down  10 reps;Hand Hold: 1;Left;1 set;Step Height: 4"    Functional Squat  10 reps    SLS  Each lower extremity x 5 for 2-9 seconds      Knee/Hip Exercises: Supine   Knee Extension  AROM    Knee Extension Limitations  4    Knee Flexion  AROM    Knee Flexion Limitations  112      Manual Therapy   Manual Therapy  Soft tissue mobilization    Manual therapy comments  12 minutes total completed seperately from all other skilled interventions    Soft tissue mobilization  Patient supine with 3 pillows under head. Soft tissue mobilization to left hamstrings and quadriceps to improve mobility, decrease pain and promote relaxation.              PT Education - 03/18/18 1121    Education provided  Yes    Education Details  Patient educated on purpose and technique of exercises throughout session.     Person(s) Educated  Patient    Methods  Explanation;Verbal cues;Tactile cues    Comprehension  Verbalized understanding;Returned demonstration       PT Short Term Goals - 03/11/18 1127      PT SHORT TERM GOAL #1   Title  Patient will demonstrate understanding and report regular compliance with HEP.    Time  3    Period  Weeks    Status  Achieved      PT SHORT TERM GOAL #2   Title  Patient will demonstrate left knee extension/flexion range of motion of at least 5-100  degrees to assist with more normalized gait pattern and stair ambulation.    Baseline  3/15: 4-112deg     Time  3  Period  Weeks    Status  New      PT SHORT TERM GOAL #3   Title  Patient will perform single limb stance on left lower extremity for 5 seconds in order to assist with stair ambulation.    Baseline  3/15: 5.05 sec, but unsteady    Time  3    Period  Weeks    Status  Partially Met        PT Long Term Goals - 03/11/18 1127      PT LONG TERM GOAL #1   Title  Patient will demonstrate improvement of 1/2 MMT grade in all muscle groups deficient at evaluation on the left lower extremity as evidence of improved strength to assist with stair ambulation and gait.      Baseline  3/15: see MMT    Time  6    Period  Weeks    Status  Partially Met      PT LONG TERM GOAL #2   Title  Patient will improve ROM for left knee extension/flexion to 0-120 degrees to improve ease of gait, sit to stands, squatting, and other functional mobility.    Baseline  3/15: 4-112deg    Time  6    Period  Weeks    Status  On-going      PT LONG TERM GOAL #3   Title  Patient will perform 5 times sit to stand in 13 seconds or less as evidence of improved balance and decreased risk of falls.     Baseline  3/15: 10.9 sec    Time  6    Period  Weeks    Status  Achieved      PT LONG TERM GOAL #4   Title  Patient will report ability to ambulate for 20 minutes with no greater than 2/10 pain for improved mobility and return to work.     Baseline  3/15: walked 1/2 mile with no pain, but did have increased soreness following; states he can walk for 20 mins with 2/10 pain or <    Time  6    Period  Weeks    Status  Achieved            Plan - 03/18/18 1204    Clinical Impression Statement  This session continued to work on progressing patient with left knee range of motion and functional strengthening. Patient reported that he has had a sinus infection and therefore modified the treatment session  to patient's tolerance. This session added step downs. Which patient tolerated well. Patient required single upper extremity support for balance. Session ended with soft tissue mobilization to patient's left hamstrings and quadriceps to patient's tolerance. Patient would benefit from continued skilled physical therapy in order to continue addressing patient's deficits in strength, range of motion, and overall functional mobility.      Rehab Potential  Good    Clinical Impairments Affecting Rehab Potential  Positive: highly motivated; positive support system; Negative: comorbidities; financial restrictions     PT Frequency  1x / week    PT Duration  4 weeks    PT Treatment/Interventions  ADLs/Self Care Home Management;Cryotherapy;DME Instruction;Gait training;Stair training;Functional mobility training;Therapeutic activities;Therapeutic exercise;Balance training;Neuromuscular re-education;Patient/family education;Orthotic Fit/Training;Manual techniques;Compression bandaging;Scar mobilization;Passive range of motion;Energy conservation    PT Next Visit Plan  Continue session focus wiht knee mobility extension>flexion.  Progress functional strengthening with step down. Progress balance activities.  Pt may request D/C due to financial concerns.  PT Home Exercise Plan  Evaluation: Quad sets supine 1 x 15 with 5 second holds 1x/day, Heel slides supine 1 x 15 with 5 second holds 1x/day; 3/15: SLS, standing knee drives on step, standing heel raises, prone quad stretch    Consulted and Agree with Plan of Care  Patient       Patient will benefit from skilled therapeutic intervention in order to improve the following deficits and impairments:  Abnormal gait, Decreased balance, Decreased endurance, Decreased mobility, Difficulty walking, Hypomobility, Impaired sensation, Decreased range of motion, Decreased scar mobility, Increased edema, Improper body mechanics, Decreased activity tolerance, Decreased strength,  Impaired flexibility, Pain  Visit Diagnosis: Muscle weakness (generalized)  Acute pain of left knee  Other abnormalities of gait and mobility  Other symptoms and signs involving the musculoskeletal system     Problem List Patient Active Problem List   Diagnosis Date Noted  . S/P total knee replacement, 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 02/07/2018  . Primary osteoarthritis of left knee 02/01/2018  . Postural hypotension 07/23/2016  . Dysphagia 07/23/2016  . Obstructive sleep apnea 07/21/2016  . Hyperglycemia 07/21/2016  . Anemia, unspecified 07/21/2016  . Spinal stenosis of lumbar region 07/15/2016  . Bladder tumor 07/27/2015  . HNP (herniated nucleus pulposus), cervical 06/05/2013    Class: Diagnosis of  . 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  . Muscle spasms of neck 04/04/2013  . 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 OA (osteoarthritis) of knee 02/22/2013  . Iliotibial band syndrome of left side 11/22/2012  . Localized, primary osteoarthritis of the ankle and foot 10/11/2012  . Difficulty in walking(719.7) 08/17/2012  . Peroneal tendinitis 08/16/2012  . Precordial pain 02/09/2012  . Essential hypertension, benign 02/09/2012  . Mixed hyperlipidemia 02/09/2012  . Dysthymia 02/04/2012  . Abnormality of gait 12/23/2011  . Lateral epicondylitis/tennis elbow 05/06/2011  . TENOSYNOVITIS OF FOOT AND ANKLE 10/22/2010   Clarene Critchley PT, DPT 12:13 PM, 03/18/18 [place], [place], [address] Phone: [phone]   Fax:  [phone]  Name: TERRI MALERBA MRN: [id number] Date of Birth: 10-25-1946

## 2018-03-21 ENCOUNTER — Ambulatory Visit (HOSPITAL_COMMUNITY): Payer: PPO | Admitting: Physical Therapy

## 2018-03-21 DIAGNOSIS — M6281 Muscle weakness (generalized): Secondary | ICD-10-CM | POA: Diagnosis not present

## 2018-03-21 DIAGNOSIS — R29898 Other symptoms and signs involving the musculoskeletal system: Secondary | ICD-10-CM

## 2018-03-21 DIAGNOSIS — R2689 Other abnormalities of gait and mobility: Secondary | ICD-10-CM

## 2018-03-21 DIAGNOSIS — M25562 Pain in left knee: Secondary | ICD-10-CM

## 2018-03-21 NOTE — Therapy (Signed)
[place], [place], [address] Phone: [phone]   Fax:  [phone]  Physical Therapy Treatment  Patient Details  Name: Zachary [name] MRN: [id number] Date of Birth: 1946/04/14 Referring Provider: Dr Aline Brochure   Encounter Date: 03/21/2018   I have read and agree with the contents of this note. Patient is to be discharged at this time due to his own request.   Clarene Critchley PT, DPT 7:49 AM, 03/22/18 [phone]     PT End of Session - 03/21/18 1202    Visit Number  9    Number of Visits  19    Date for PT Re-Evaluation  04/08/18    Authorization Type  [employer]    Authorization Time Period  02/22/18 to 04/08/18    PT Start Time  1120    PT Stop Time  1158    PT Time Calculation (min)  38 min    Activity Tolerance  Patient tolerated treatment well;No increased pain    Behavior During Therapy  WFL for tasks assessed/performed       Past Medical History:  Diagnosis Date  . Anxiety    takes Valium as needed  . Arthritis   . Bladder cancer ([place])    takes Rapaflo daily  . Blood dyscrasia    07/08/16: pt told he was a "free bleeder" after bleeding a lot when derm cut off mole on forehead. No excessive bleeding with minor wounds at home, no bleeding problems perioperatively with prior surgeries.  . Chronic back pain    spondylolisthesis  . Cluster headaches   . Depression    takes Lexapro daily  . Essential hypertension, benign    takes Diovan-HCT daily  . Hx of complications due to general anesthesia    Aborted surgery 07/15/2016 due to hypotension per pt  . Joint pain   . Obstructive sleep apnea on CPAP    uses CPAP @ night  . Pneumonia    "several times"--last time about 3-4 yrs ago  . Prediabetes     Past Surgical History:  Procedure Laterality Date  . ANTERIOR CERVICAL DECOMP/DISCECTOMY FUSION N/A 06/05/2013   Procedure:  C3-4 Anterior Cervical Discectomy and Fusion, Allograft, Plate;   Surgeon: [name], MD;  Location: [facility];  Service: Orthopedics;  Laterality: N/A;  C3-4 Anterior Cervical Discectomy and Fusion, Allograft, Plate  . Arthroscopic knee surgery    . BACK SURGERY      x 2  . BLADDER SURGERY     x 5 to remove tumor  . CARPAL TUNNEL RELEASE    . CATARACT EXTRACTION W/PHACO  12/19/2012   Procedure: CATARACT EXTRACTION PHACO AND INTRAOCULAR LENS PLACEMENT (IOC);  Surgeon: [name], MD;  Location: [facility];  Service: Ophthalmology;  Laterality: Left;  CDE: 26.80  . CATARACT EXTRACTION W/PHACO  01/09/2013   Procedure: CATARACT EXTRACTION PHACO AND INTRAOCULAR LENS PLACEMENT (IOC);  Surgeon: [name], MD;  Location: [facility];  Service: Ophthalmology;  Laterality: Right;  CDE:22.83  . CERVICAL FUSION  06/05/2013   C 3  C4  . CYSTOSCOPY    . REFRACTIVE SURGERY     Hx: of  . RETINAL DETACHMENT SURGERY    . TOTAL KNEE ARTHROPLASTY Left 02/01/2018   Procedure: TOTAL KNEE ARTHROPLASTY;  Surgeon: Carole Civil, MD;  Location: [facility];  Service: Orthopedics;  Laterality: Left;  . TRANSURETHRAL RESECTION OF BLADDER TUMOR Right 07/27/2015   Procedure: TRANSURETHRAL RESECTION OF BLADDER TUMOR (TURBT);  Surgeon: [name], MD;  [facility];  Service: Urology;  Laterality: Right;  . wisdom tooth extraction      There were no vitals filed for this visit.  Subjective Assessment - 03/21/18 1208    Subjective  Pt states he would like to work on steps and then be discharged.  Currently without pain just stiffness.     Currently in Pain?  No/denies         [facility] PT Assessment - 03/21/18 0001      Assessment   Medical Diagnosis  S/P total knee replacement, left    Referring Provider  Dr Aline Brochure    Next MD Visit  6 weeks      AROM   Right/Left Knee  Left    Left Knee Extension  4 was 18 on 2/26, 5 on 3/15    Left Knee Flexion  116 was 91 on 2/26 and 108 on 3/15      Strength   Right Hip Flexion  5/5    Right Hip Extension  4/5    Right Hip  ABduction  5/5    Left Hip Flexion  5/5    Left Hip Extension  4/5    Left Hip ABduction  4+/5    Right Knee Flexion  5/5    Right Knee Extension  5/5    Left Knee Flexion  5/5    Left Knee Extension  5/5    Right Ankle Dorsiflexion  5/5    Left Ankle Dorsiflexion  5/5      6 minute walk test results    Aerobic Endurance Distance Walked  512    Endurance additional comments  3MWT      Static Standing Balance   Static Standing - Balance Support  No upper extremity supported    Static Standing Balance -  Activities   Single Leg Stance - Left Leg    Static Standing - Comment/# of Minutes  Lt: 7 seconds was 2 sec 2/26, 5.05 sec 3/15      Standardized Balance Assessment   Standardized Balance Assessment  Five Times Sit to Stand    Five times sit to stand comments   8.63 chair and no UE's was 17 sec 2/26, 10.9 sec 3/15            No data recorded       [facility] Adult PT Treatment/Exercise - 03/21/18 0001      Knee/Hip Exercises: Stretches   Passive Hamstring Stretch  3 reps;30 seconds    Passive Hamstring Stretch Limitations  On 12 inch step left lower extremity    Knee: Self-Stretch to increase Flexion  5 reps;10 seconds;Limitations    Knee: Self-Stretch Limitations  knee drives on 10GY step 5x 10"    Gastroc Stretch  3 reps;30 seconds    Gastroc Stretch Limitations  slant board      Knee/Hip Exercises: Standing   Stairs  7" steps 5RT               PT Short Term Goals - 03/21/18 1134      PT SHORT TERM GOAL #1   Title  Patient will demonstrate understanding and report regular compliance with HEP.    Time  3    Period  Weeks    Status  Achieved      PT SHORT TERM GOAL #2   Title  Patient will demonstrate left knee extension/flexion range of motion of at least 5-100 degrees to assist  with more normalized gait pattern and stair ambulation.    Baseline  3/15: 4-112deg     Time  3    Period  Weeks    Status  Achieved      PT SHORT TERM GOAL #3   Title   Patient will perform single limb stance on left lower extremity for 5 seconds in order to assist with stair ambulation.    Baseline  3/15: 5.05 sec, but unsteady    Time  3    Period  Weeks    Status  Achieved        PT Long Term Goals - 03/21/18 1135      PT LONG TERM GOAL #1   Title  Patient will demonstrate improvement of 1/2 MMT grade in all muscle groups deficient at evaluation on the left lower extremity as evidence of improved strength to assist with stair ambulation and gait.      Baseline  3/15: see MMT    Time  6    Period  Weeks    Status  Partially Met      PT LONG TERM GOAL #2   Title  Patient will improve ROM for left knee extension/flexion to 0-120 degrees to improve ease of gait, sit to stands, squatting, and other functional mobility.    Baseline  3/15: 4-112deg, 3/25 4-116 degrees    Time  6    Period  Weeks    Status  Not Met      PT LONG TERM GOAL #3   Title  Patient will perform 5 times sit to stand in 13 seconds or less as evidence of improved balance and decreased risk of falls.     Baseline  3/15: 10.9 sec    Time  6    Period  Weeks    Status  Achieved      PT LONG TERM GOAL #4   Title  Patient will report ability to ambulate for 20 minutes with no greater than 2/10 pain for improved mobility and return to work.     Baseline  3/15: walked 1/2 mile with no pain, but did have increased soreness following; states he can walk for 20 mins with 2/10 pain or <    Time  6    Period  Weeks    Status  Achieved            Plan - 03/21/18 1202    Clinical Impression Statement  Pt entered dept today requesting today be his last visit.  Pt is independent with HEP and states really his only issue is stairs.  Worked on 7" stairs with 1 HR and then without HR with patient able to complete reciprocally with only minimal discomfort and instablity with descending.  ROM remains at 4-116 with general tightness perimeter of knee.  Pt encouraged to continue ROM,  ambulation (walking 1 mile currently) and remaining exercises as instructed.  Pt verbalzied understanding.      Rehab Potential  Good    Clinical Impairments Affecting Rehab Potential  Positive: highly motivated; positive support system; Negative: comorbidities; financial restrictions     PT Frequency  1x / week    PT Duration  4 weeks    PT Treatment/Interventions  ADLs/Self Care Home Management;Cryotherapy;DME Instruction;Gait training;Stair training;Functional mobility training;Therapeutic activities;Therapeutic exercise;Balance training;Neuromuscular re-education;Patient/family education;Orthotic Fit/Training;Manual techniques;Compression bandaging;Scar mobilization;Passive range of motion;Energy conservation    PT Next Visit Plan  discharge per patient request.     PT Home Exercise Plan  Evaluation: Quad sets supine 1 x 15 with 5 second holds 1x/day, Heel slides supine 1 x 15 with 5 second holds 1x/day; 3/15: SLS, standing knee drives on step, standing heel raises, prone quad stretch    Consulted and Agree with Plan of Care  Patient       Patient will benefit from skilled therapeutic intervention in order to improve the following deficits and impairments:  Abnormal gait, Decreased balance, Decreased endurance, Decreased mobility, Difficulty walking, Hypomobility, Impaired sensation, Decreased range of motion, Decreased scar mobility, Increased edema, Improper body mechanics, Decreased activity tolerance, Decreased strength, Impaired flexibility, Pain  Visit Diagnosis: Muscle weakness (generalized)  Acute pain of left knee  Other abnormalities of gait and mobility  Other symptoms and signs involving the musculoskeletal system     Problem List Patient Active Problem List   Diagnosis Date Noted  . S/P total knee replacement, 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 02/07/2018  . Primary osteoarthritis of left knee 02/01/2018  . Postural hypotension 07/23/2016  . Dysphagia 07/23/2016  . Obstructive sleep apnea  07/21/2016  . Hyperglycemia 07/21/2016  . Anemia, unspecified 07/21/2016  . Spinal stenosis of lumbar region 07/15/2016  . Bladder tumor 07/27/2015  . HNP (herniated nucleus pulposus), cervical 06/05/2013    Class: Diagnosis of  . 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  . Muscle spasms of neck 04/04/2013  . 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 OA (osteoarthritis) of knee 02/22/2013  . Iliotibial band syndrome of left side 11/22/2012  . Localized, primary osteoarthritis of the ankle and foot 10/11/2012  . Difficulty in walking(719.7) 08/17/2012  . Peroneal tendinitis 08/16/2012  . Precordial pain 02/09/2012  . Essential hypertension, benign 02/09/2012  . Mixed hyperlipidemia 02/09/2012  . Dysthymia 02/04/2012  . Abnormality of gait 12/23/2011  . Lateral epicondylitis/tennis elbow 05/06/2011  . TENOSYNOVITIS OF FOOT AND ANKLE 10/22/2010   Teena Irani, PTA/CLT [phone]  Mare Ferrari, Day Deery B 03/21/2018, 12:10 PM   PHYSICAL THERAPY DISCHARGE SUMMARY  Visits from Start of Care: 9  Current functional level related to goals / functional outcomes: See above   Remaining deficits: See above   Education / Equipment: See above. Patient has been educated on a home exercise program throughout therapy sessions.  Plan: Patient agrees to discharge.  Patient goals were partially met. Patient is being discharged due to the patient's request.  ?????   Clarene Critchley PT, DPT 7:52 AM, 03/22/18 [place], [place], [address] Phone: [phone]   Fax:  [phone]  Name: Zachary [name] MRN: [id number] Date of Birth: August 14, 1946

## 2018-03-23 ENCOUNTER — Encounter (HOSPITAL_COMMUNITY): Payer: Self-pay | Admitting: Physical Therapy

## 2018-03-25 ENCOUNTER — Encounter (HOSPITAL_COMMUNITY): Payer: Self-pay | Admitting: Physical Therapy

## 2018-03-26 ENCOUNTER — Emergency Department (HOSPITAL_COMMUNITY)
Admission: EM | Admit: 2018-03-26 | Discharge: 2018-03-27 | Disposition: A | Discharge status: Home / Self Care | Payer: PPO | Attending: Emergency Medicine | Admitting: Emergency Medicine

## 2018-03-26 ENCOUNTER — Emergency Department (HOSPITAL_COMMUNITY): Payer: PPO

## 2018-03-26 ENCOUNTER — Encounter (HOSPITAL_COMMUNITY): Payer: Self-pay | Admitting: Emergency Medicine

## 2018-03-26 DIAGNOSIS — I10 Essential (primary) hypertension: Secondary | ICD-10-CM | POA: Diagnosis not present

## 2018-03-26 DIAGNOSIS — Z87891 Personal history of nicotine dependence: Secondary | ICD-10-CM | POA: Diagnosis not present

## 2018-03-26 DIAGNOSIS — S299XXA Unspecified injury of thorax, initial encounter: Secondary | ICD-10-CM | POA: Diagnosis not present

## 2018-03-26 DIAGNOSIS — M25532 Pain in left wrist: Secondary | ICD-10-CM | POA: Diagnosis not present

## 2018-03-26 DIAGNOSIS — M7989 Other specified soft tissue disorders: Secondary | ICD-10-CM | POA: Diagnosis not present

## 2018-03-26 DIAGNOSIS — Y9389 Activity, other specified: Secondary | ICD-10-CM | POA: Insufficient documentation

## 2018-03-26 DIAGNOSIS — Z96652 Presence of left artificial knee joint: Secondary | ICD-10-CM | POA: Diagnosis not present

## 2018-03-26 DIAGNOSIS — S81012A Laceration without foreign body, left knee, initial encounter: Secondary | ICD-10-CM | POA: Diagnosis not present

## 2018-03-26 DIAGNOSIS — S6992XA Unspecified injury of left wrist, hand and finger(s), initial encounter: Secondary | ICD-10-CM | POA: Diagnosis not present

## 2018-03-26 DIAGNOSIS — Z8551 Personal history of malignant neoplasm of bladder: Secondary | ICD-10-CM | POA: Diagnosis not present

## 2018-03-26 DIAGNOSIS — Y9229 Other specified public building as the place of occurrence of the external cause: Secondary | ICD-10-CM | POA: Insufficient documentation

## 2018-03-26 DIAGNOSIS — M25462 Effusion, left knee: Secondary | ICD-10-CM | POA: Diagnosis not present

## 2018-03-26 DIAGNOSIS — W108XXA Fall (on) (from) other stairs and steps, initial encounter: Secondary | ICD-10-CM | POA: Diagnosis not present

## 2018-03-26 DIAGNOSIS — Y999 Unspecified external cause status: Secondary | ICD-10-CM | POA: Diagnosis not present

## 2018-03-26 DIAGNOSIS — Z7982 Long term (current) use of aspirin: Secondary | ICD-10-CM | POA: Insufficient documentation

## 2018-03-26 DIAGNOSIS — Z79899 Other long term (current) drug therapy: Secondary | ICD-10-CM | POA: Diagnosis not present

## 2018-03-26 DIAGNOSIS — R0781 Pleurodynia: Secondary | ICD-10-CM | POA: Diagnosis not present

## 2018-03-26 MED ORDER — LIDOCAINE HCL (PF) 1 % IJ SOLN
30.0000 mL | Freq: Once | INTRAMUSCULAR | Status: DC
  Filled 2018-03-26: qty 30

## 2018-03-26 MED ORDER — POVIDONE-IODINE 10 % EX SOLN
CUTANEOUS | Status: AC
  Filled 2018-03-26: qty 30

## 2018-03-26 MED ORDER — HYDROCODONE-ACETAMINOPHEN 5-325 MG PO TABS
1.0000 | ORAL_TABLET | ORAL | Status: AC
  Administered 2018-03-27: 1 via ORAL
  Filled 2018-03-26: qty 1

## 2018-03-26 MED ORDER — LIDOCAINE HCL (PF) 2 % IJ SOLN
INTRAMUSCULAR | Status: AC
  Administered 2018-03-26: 30 mL
  Filled 2018-03-26: qty 50

## 2018-03-26 MED ORDER — BACITRACIN ZINC 500 UNIT/GM EX OINT
1.0000 "application " | TOPICAL_OINTMENT | Freq: Once | CUTANEOUS | Status: AC
  Administered 2018-03-27: 1 via TOPICAL
  Filled 2018-03-26: qty 0.9

## 2018-03-26 NOTE — ED Triage Notes (Signed)
Pt had recent L. knee surgey (Feb 5th) and was at a wedding tonight when he fell down stairs and "open his incision back up". Pt also c/o L. Wrist pain and hit shoulder but denies pain to shoulder.

## 2018-03-26 NOTE — ED Provider Notes (Addendum)
[facility] Provider Note   CSN: [id number] Arrival date & time: 03/26/18  2203     History   Chief Complaint Chief Complaint  Patient presents with  . Fall    HPI RAMZEY PETROVIC is a 72 y.o. male.  HPI Patient presents to the emergency room for evaluation of injuries associated with a fall.  Patient was walking out of an event facility where he was there for a wedding.  Patient states there were several stairs that he did not notice any not falling down 3 stairs.  Patient landed on his left side injuring his left wrist.  He is also complaining of pain in his left ribs although is not having any shortness of breath and no difficulty breathing.  He also injured his left knee where he sustained a large laceration.  Patient states he had knee replacement surgery a couple of months ago.  Fall seems to have opened up his surgical wound.  He denies any head injury or loss of consciousness.  No abdominal pain.  No numbness or weakness. Past Medical History:  Diagnosis Date  . Anxiety    takes Valium as needed  . Arthritis   . Bladder cancer ([place])    takes Rapaflo daily  . Blood dyscrasia    07/08/16: pt told he was a "free bleeder" after bleeding a lot when derm cut off mole on forehead. No excessive bleeding with minor wounds at home, no bleeding problems perioperatively with prior surgeries.  . Chronic back pain    spondylolisthesis  . Cluster headaches   . Depression    takes Lexapro daily  . Essential hypertension, benign    takes Diovan-HCT daily  . Hx of complications due to general anesthesia    Aborted surgery 07/15/2016 due to hypotension per pt  . Joint pain   . Obstructive sleep apnea on CPAP    uses CPAP @ night  . Pneumonia    "several times"--last time about 3-4 yrs ago  . Prediabetes     Patient Active Problem List   Diagnosis Date Noted  . S/P total knee replacement, 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 02/07/2018  . Primary osteoarthritis of left knee 02/01/2018    . Postural hypotension 07/23/2016  . Dysphagia 07/23/2016  . Obstructive sleep apnea 07/21/2016  . Hyperglycemia 07/21/2016  . Anemia, unspecified 07/21/2016  . Spinal stenosis of lumbar region 07/15/2016  . Bladder tumor 07/27/2015  . HNP (herniated nucleus pulposus), cervical 06/05/2013    Class: Diagnosis of  . 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  . Muscle spasms of neck 04/04/2013  . 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 OA (osteoarthritis) of knee 02/22/2013  . Iliotibial band syndrome of left side 11/22/2012  . Localized, primary osteoarthritis of the ankle and foot 10/11/2012  . Difficulty in walking(719.7) 08/17/2012  . Peroneal tendinitis 08/16/2012  . Precordial pain 02/09/2012  . Essential hypertension, benign 02/09/2012  . Mixed hyperlipidemia 02/09/2012  . Dysthymia 02/04/2012  . Abnormality of gait 12/23/2011  . Lateral epicondylitis/tennis elbow 05/06/2011  . TENOSYNOVITIS OF FOOT AND ANKLE 10/22/2010    Past Surgical History:  Procedure Laterality Date  . ANTERIOR CERVICAL DECOMP/DISCECTOMY FUSION N/A 06/05/2013   Procedure:  C3-4 Anterior Cervical Discectomy and Fusion, Allograft, Plate;  Surgeon: [name], MD;  Location: [facility];  Service: Orthopedics;  Laterality: N/A;  C3-4 Anterior Cervical Discectomy and Fusion, Allograft, Plate  . Arthroscopic knee surgery    . BACK SURGERY  x 2  . BLADDER SURGERY     x 5 to remove tumor  . CARPAL TUNNEL RELEASE    . CATARACT EXTRACTION W/PHACO  12/19/2012   Procedure: CATARACT EXTRACTION PHACO AND INTRAOCULAR LENS PLACEMENT (IOC);  Surgeon: [name], MD;  Location: [facility];  Service: Ophthalmology;  Laterality: Left;  CDE: 26.80  . CATARACT EXTRACTION W/PHACO  01/09/2013   Procedure: CATARACT EXTRACTION PHACO AND INTRAOCULAR LENS PLACEMENT (IOC);  Surgeon: [name], MD;  Location: [facility];  Service: Ophthalmology;  Laterality: Right;  CDE:22.83  . CERVICAL FUSION  06/05/2013   C 3  C4  . CYSTOSCOPY     . REFRACTIVE SURGERY     Hx: of  . RETINAL DETACHMENT SURGERY    . TOTAL KNEE ARTHROPLASTY Left 02/01/2018   Procedure: TOTAL KNEE ARTHROPLASTY;  Surgeon: Carole Civil, MD;  Location: [facility];  Service: Orthopedics;  Laterality: Left;  . TRANSURETHRAL RESECTION OF BLADDER TUMOR Right 07/27/2015   Procedure: TRANSURETHRAL RESECTION OF BLADDER TUMOR (TURBT);  Surgeon: [name], MD;  Location: [facility];  Service: Urology;  Laterality: Right;  . wisdom tooth extraction          Home Medications    Prior to Admission medications   Medication Sig Start Date End Date Taking? Authorizing Provider  acetaminophen (TYLENOL) 500 MG tablet Take 500-1,000 mg by mouth every 6 (six) hours as needed (for pain.).    [provider]  amoxicillin (AMOXIL) 500 MG capsule  03/15/18   [provider]  aspirin 81 MG chewable tablet Chew 1 tablet (81 mg total) by mouth 2 (two) times daily. 02/04/18   Carole Civil, MD  carbidopa-levodopa (SINEMET IR) 25-100 MG tablet Take 1 tablet by mouth 3 (three) times daily before meals. 09/21/17   Penumalli, Earlean Polka, MD  carboxymethylcellulose (REFRESH PLUS) 0.5 % SOLN Place 1 drop into both eyes 3 (three) times daily as needed (for dry eyes.).     [provider]  diazepam (VALIUM) 5 MG tablet Take 1 tablet (5 mg total) by mouth 2 (two) times daily as needed. for anxiety 02/07/18   Wille Celeste, PA-C  DULoxetine (CYMBALTA) 60 MG capsule TAKE 1 CAPSULE(60 MG) BY MOUTH DAILY 01/26/18   Cloria Spring, MD  fluticasone [facility]) 50 MCG/ACT nasal spray Place 1-2 sprays into both nostrils daily as needed (for allergies.).     [provider]  ibuprofen (ADVIL,MOTRIN) 200 MG tablet Take 400 mg by mouth every 8 (eight) hours as needed (for pain.).    [provider]  methocarbamol (ROBAXIN) 500 MG tablet Take 1 tablet (500 mg total) by mouth every 6 (six) hours as needed for muscle spasms. 02/04/18   Carole Civil, MD   polyethylene glycol [facility] Floria Raveling) packet Take 17 g by mouth daily.    [provider]  predniSONE (DELTASONE) 10 MG tablet  03/15/18   [provider]  valsartan-hydrochlorothiazide (DIOVAN-HCT) 320-12.5 MG tablet Take 1 tablet by mouth daily.    [provider]    Family History Family History  Problem Relation Age of Onset  . Hypertension Father   . Cancer Father        Prostate  . Depression Mother   . Hypertension Mother   . Alcohol abuse Mother   . COPD Mother        passed 07-08-17  . Hypertension Sister   . Anxiety disorder Sister   . Alcohol abuse Maternal Grandfather   .  ADD / ADHD Neg Hx   . Bipolar disorder Neg Hx   . Dementia Neg Hx   . Drug abuse Neg Hx   . OCD Neg Hx   . Paranoid behavior Neg Hx   . Schizophrenia Neg Hx   . Seizures Neg Hx   . Sexual abuse Neg Hx   . Physical abuse Neg Hx     Social History Social History   Tobacco Use  . Smoking status: Former Smoker    Last attempt to quit: 12/30/1976    Years since quitting: 41.2  . Smokeless tobacco: Never Used  . Tobacco comment: 1985  Substance Use Topics  . Alcohol use: No    Comment: quit 1990  . Drug use: No     Allergies   Sulfonamide derivatives   Review of Systems Review of Systems  All other systems reviewed and are negative.    Physical Exam Updated Vital Signs BP (!) 150/70 (BP Location: Right Arm)   Pulse (!) 102   Temp 98.6 F (37 C) (Oral)   Resp 17   Ht 1.829 m (6')   Wt 104.3 kg (230 lb)   SpO2 96%   BMI 31.19 kg/m   Physical Exam  Constitutional: He appears well-developed and well-nourished. No distress.  HENT:  Head: Normocephalic and atraumatic.  Right Ear: External ear normal.  Left Ear: External ear normal.  Eyes: Conjunctivae are normal. Right eye exhibits no discharge. Left eye exhibits no discharge. No scleral icterus.  Neck: Neck supple. No tracheal deviation present.  Cardiovascular: Normal rate, regular rhythm and  intact distal pulses.  Pulmonary/Chest: Effort normal and breath sounds normal. No stridor. No respiratory distress. He has no wheezes. He has no rales.  Abdominal: Soft. Bowel sounds are normal. He exhibits no distension. There is no tenderness. There is no rebound and no guarding.  Musculoskeletal: He exhibits no edema.       Right shoulder: He exhibits no tenderness, no bony tenderness and no swelling.       Left shoulder: He exhibits no tenderness, no bony tenderness and no swelling.       Right wrist: He exhibits no tenderness, no bony tenderness and no swelling.       Left wrist: He exhibits tenderness and bony tenderness. He exhibits no swelling, no effusion and no deformity.       Right hip: He exhibits normal range of motion, no tenderness, no bony tenderness and no swelling.       Left hip: He exhibits normal range of motion, no tenderness and no bony tenderness.       Left knee: He exhibits no swelling. Tenderness found.       Right ankle: He exhibits no swelling. No tenderness.       Left ankle: He exhibits no swelling. No tenderness.       Cervical back: He exhibits no tenderness, no bony tenderness and no swelling.       Thoracic back: He exhibits no tenderness, no bony tenderness and no swelling.       Lumbar back: He exhibits no tenderness, no bony tenderness and no swelling.  Approximately 12 cm vertical laceration along the midline of the left knee, no bone exposure, nv intact, few mm separation of the wound margins  Neurological: He is alert. He has normal strength. No cranial nerve deficit (no facial droop, extraocular movements intact, no slurred speech) or sensory deficit. He exhibits normal muscle tone. He displays no seizure activity.  Coordination normal.  Skin: Skin is warm and dry. No rash noted.  Psychiatric: He has a normal mood and affect.  Nursing note and vitals reviewed.    ED Treatments / Results  Labs (all labs ordered are listed, but only abnormal results  are displayed) Labs Reviewed - No data to display  EKG None  Radiology Dg Ribs Unilateral W/chest Left  Result Date: 03/26/2018 CLINICAL DATA:  72 year old male with fall and left anterior rib pain. EXAM: LEFT RIBS AND CHEST - 3+ VIEW COMPARISON:  Chest radiograph dated 07/16/2016 and CT dated 12/23/2016 FINDINGS: The lungs are clear. There is no pleural effusion or pneumothorax. The cardiac silhouette is within normal limits. Old healed left posterior seventh rib fracture. No acute osseous pathology. IMPRESSION: No acute cardiopulmonary process.  No acute rib fracture. Electronically Signed   By: Anner Crete M.D.   On: 03/26/2018 23:35   Dg Wrist Complete Left  Result Date: 03/26/2018 CLINICAL DATA:  72 year old male with fall and left wrist pain. EXAM: LEFT WRIST - COMPLETE 3+ VIEW COMPARISON:  None. FINDINGS: There is no acute fracture or dislocation. The bones are osteopenic. There is arthritic changes of the base of the thumb. Mild soft tissue swelling of the dorsum of the wrist. IMPRESSION: No acute fracture or dislocation. Electronically Signed   By: Anner Crete M.D.   On: 03/26/2018 23:38   Dg Knee Complete 4 Views Left  Result Date: 03/26/2018 CLINICAL DATA:  Fall.  Recent left knee arthroplasty. EXAM: LEFT KNEE - COMPLETE 4+ VIEW COMPARISON:  02/01/2018 left knee radiographs FINDINGS: Left total knee arthroplasty, with no evidence of hardware fracture or loosening. No acute osseous fracture. No left knee dislocation. Small suprapatellar left knee joint effusion. Small superior and inferior left patellar enthesophytes. Vascular calcifications throughout the posterior soft tissues. Apparent mild subcutaneous emphysema overlying the lower patellar tendon on the lateral view. IMPRESSION: 1. Left total knee arthroplasty, with no evidence of hardware complication. 2. No acute osseous fracture.  No left knee dislocation. 3. Small suprapatellar left knee joint effusion. 4. Apparent mild  subcutaneous emphysema overlying the lower patellar tendon on the lateral view. Electronically Signed   By: Ilona Sorrel M.D.   On: 03/26/2018 23:38    Procedures .Marland KitchenLaceration Repair Date/Time: 03/26/2018 11:46 PM Performed by: Dorie Rank, MD Authorized by: Dorie Rank, MD   Consent:    Consent obtained:  Verbal   Consent given by:  Patient   Risks discussed:  Infection, need for additional repair, pain, poor cosmetic result and poor wound healing   Alternatives discussed:  No treatment and delayed treatment Universal protocol:    Procedure explained and questions answered to patient or proxy's satisfaction: yes     Relevant documents present and verified: yes     Test results available and properly labeled: yes     Imaging studies available: yes     Required blood products, implants, devices, and special equipment available: yes     Site/side marked: yes     Immediately prior to procedure, a time out was called: yes     Patient identity confirmed:  Verbally with patient Anesthesia (see MAR for exact dosages):    Anesthesia method:  Local infiltration   Local anesthetic:  Lidocaine 1% w/o epi Laceration details:    Location:  Leg   Leg location:  L knee   Length (cm):  12   Depth (mm):  3 Repair type:    Repair type:  Complex Pre-procedure details:  Preparation:  Patient was prepped and draped in usual sterile fashion Exploration:    Wound exploration: wound explored through full range of motion and entire depth of wound probed and visualized     Wound extent: no foreign bodies/material noted, no muscle damage noted, no underlying fracture noted and no vascular damage noted     Contaminated: no   Treatment:    Area cleansed with:  Betadine   Amount of cleaning:  Extensive   Irrigation solution:  Sterile saline   Irrigation method:  Syringe   Visualized foreign bodies/material removed: no     Debridement:  None   Undermining:  None   Scar revision: no   Subcutaneous  repair:    Suture size:  4-0   Suture material:  Vicryl   Number of sutures:  5 Skin repair:    Repair method:  Staples   Number of staples:  15 Post-procedure details:    Dressing:  Antibiotic ointment and sterile dressing   Patient tolerance of procedure:  Tolerated well, no immediate complications   (including critical care time)  Medications Ordered in ED Medications  lidocaine (PF) (XYLOCAINE) 1 % injection 30 mL (has no administration in time range)  povidone-iodine (BETADINE) 10 % external solution (has no administration in time range)  bacitracin ointment 1 application (has no administration in time range)  lidocaine (XYLOCAINE) 2 % injection (30 mLs  Given by Other 03/26/18 2319)     Initial Impression / Assessment and Plan / ED Course  I have reviewed the triage vital signs and the nursing notes.  Pertinent labs & imaging results that were available during my care of the patient were reviewed by me and considered in my medical decision making (see chart for details).   Patient presented to the emergency room for evaluation of injuries after fall.  No factors noted on x-ray.  Patient's knee laceration was explored no evidence of joint or bony involvement.  Patient's knee laceration was repaired.  he tolerated the procedure well.  Will discharge home and have him use his knee immobilizer.  She will follow-up with Dr. Aline Brochure to have wound rechecked  Final Clinical Impressions(s) / ED Diagnoses   Final diagnoses:  Laceration of left knee, initial encounter    ED Discharge Orders    None       Dorie Rank, MD 03/26/18 2350    Dorie Rank, MD 03/26/18 2350

## 2018-03-27 NOTE — ED Notes (Signed)
Pt had own knee brace, applied prior to dc

## 2018-03-28 ENCOUNTER — Telehealth: Payer: Self-pay | Admitting: Orthopedic Surgery

## 2018-03-28 ENCOUNTER — Ambulatory Visit (INDEPENDENT_AMBULATORY_CARE_PROVIDER_SITE_OTHER): Payer: PPO | Admitting: Orthopedic Surgery

## 2018-03-28 ENCOUNTER — Encounter (HOSPITAL_COMMUNITY): Payer: Self-pay | Admitting: Physical Therapy

## 2018-03-28 VITALS — BP 120/82 | HR 114 | Ht 72.0 in | Wt 230.0 lb

## 2018-03-28 DIAGNOSIS — S81012A Laceration without foreign body, left knee, initial encounter: Secondary | ICD-10-CM

## 2018-03-28 MED ORDER — CEPHALEXIN 500 MG PO CAPS: 500.0000 mg | ORAL_CAPSULE | Freq: Four times a day (QID) | ORAL | 3 refills | Status: DC

## 2018-03-28 NOTE — Progress Notes (Signed)
Progress Note   Patient ID: Zachary [name], male   DOB: 08/03/1946, 72 y.o.   MRN: [id number]  Chief Complaint  Patient presents with  . Knee Injury    ER follow up on left knee, DOI 03-26-18.    72 year old male had a knee replacement in February on the fifth did well doing well until he fell and split the inferior portion of the incision open went to the ER for washout 2 layer closure no antibiotics comes in immediately after we found out he does not complain of pain no drainage at present    ROS No outpatient medications have been marked as taking for the 03/28/18 encounter (Office Visit) with Carole Civil, MD.    Allergies  Allergen Reactions  . Sulfonamide Derivatives Itching, Rash and Other (See Comments)    welts     BP 120/82   Pulse (!) 114   Ht 6' (1.829 m)   Wt 230 lb (104.3 kg)   BMI 31.19 kg/m   Physical Exam  Staples are noted on the incision line is the inferior half of the incision no surrounding erythema or drainage he is walking well no significant pain or instability  Complains of left wrist pain   Medical decision-making Encounter Diagnosis  Name Primary?  . Laceration of left knee, initial encounter Yes    I am going to start him on antibiotics p.o. told him that this could become an amputation revision hopefully with antibiotics and close wound care follow-up he will not have to have another irrigation debridement or worse  Follow-up next week  Meds ordered this encounter  Medications  . cephALEXin (KEFLEX) 500 MG capsule    Sig: Take 1 capsule (500 mg total) by mouth 4 (four) times daily.    Dispense:  28 capsule    Refill:  3     Arther Abbott, MD 03/28/2018 3:28 PM

## 2018-03-28 NOTE — Telephone Encounter (Signed)
Asap today this afternoon

## 2018-03-28 NOTE — Telephone Encounter (Signed)
Pt had left knee replacement back on 02/01/2018 and on Saturday he fell and opened up his knee. He went to the ER and they repaired the laceration. They told him to see Dr. Aline Brochure he says today, but we have nowhere to put him.   Please advise. I told him I would call him back as soon as I get a reply

## 2018-03-28 NOTE — Telephone Encounter (Signed)
Today, this afternoon

## 2018-03-30 ENCOUNTER — Encounter (HOSPITAL_COMMUNITY): Payer: Self-pay | Admitting: Physical Therapy

## 2018-04-01 ENCOUNTER — Encounter (HOSPITAL_COMMUNITY): Payer: Self-pay | Admitting: Physical Therapy

## 2018-04-04 ENCOUNTER — Encounter (HOSPITAL_COMMUNITY): Payer: Self-pay | Admitting: Physical Therapy

## 2018-04-04 ENCOUNTER — Encounter: Payer: Self-pay | Admitting: Orthopedic Surgery

## 2018-04-04 ENCOUNTER — Ambulatory Visit: Payer: Self-pay | Admitting: Orthopedic Surgery

## 2018-04-04 ENCOUNTER — Telehealth: Payer: Self-pay | Admitting: Orthopedic Surgery

## 2018-04-04 VITALS — BP 149/98 | HR 85 | Ht 72.0 in | Wt 227.0 lb

## 2018-04-04 DIAGNOSIS — S81012D Laceration without foreign body, left knee, subsequent encounter: Secondary | ICD-10-CM

## 2018-04-04 DIAGNOSIS — Z96652 Presence of left artificial knee joint: Secondary | ICD-10-CM

## 2018-04-04 NOTE — Telephone Encounter (Signed)
Zachary [name] called and wanted to know if he is to continue the antibiotics.    Please call him and advise  Thanks

## 2018-04-04 NOTE — Progress Notes (Signed)
Chief Complaint  Patient presents with  . Knee Pain    laceration 03/26/18  . Post-op Follow-up    left knee TKR 02/01/18    This is a follow-up visit patient has had a laceration to his left knee sutures were placed deep and staples superficially is on Keflex  Date of injury was 30 March  His wound looks clean there is no sign of erythema or drainage he is having no increased pain  Encounter Diagnoses  Name Primary?  . S/P total knee replacement, 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 Yes  . Laceration of left knee, subsequent encounter      Staples out in a week continue Keflex

## 2018-04-05 NOTE — Telephone Encounter (Signed)
Yes, he needs to take ABX until they are gone. I have called him to advise. Left message for him to call back just to let me know he has gotten the message.

## 2018-04-06 ENCOUNTER — Ambulatory Visit (HOSPITAL_COMMUNITY): Payer: Self-pay | Admitting: Physical Therapy

## 2018-04-06 NOTE — Telephone Encounter (Signed)
He called back yesterday to advise he has gotten my message and understands.

## 2018-04-08 ENCOUNTER — Encounter (HOSPITAL_COMMUNITY): Payer: Self-pay | Admitting: Physical Therapy

## 2018-04-11 ENCOUNTER — Ambulatory Visit (INDEPENDENT_AMBULATORY_CARE_PROVIDER_SITE_OTHER): Payer: Self-pay | Admitting: Orthopedic Surgery

## 2018-04-11 ENCOUNTER — Encounter: Payer: Self-pay | Admitting: Orthopedic Surgery

## 2018-04-11 VITALS — Resp 16 | Ht 72.0 in | Wt 227.0 lb

## 2018-04-11 DIAGNOSIS — S81012D Laceration without foreign body, left knee, subsequent encounter: Secondary | ICD-10-CM

## 2018-04-11 DIAGNOSIS — S62025G Nondisplaced fracture of middle third of navicular [scaphoid] bone of left wrist, subsequent encounter for fracture with delayed healing: Secondary | ICD-10-CM

## 2018-04-11 DIAGNOSIS — Z96652 Presence of left artificial knee joint: Secondary | ICD-10-CM

## 2018-04-11 NOTE — Addendum Note (Signed)
Addended byCandice Camp on: 04/11/2018 03:39 PM   Modules accepted: Orders

## 2018-04-11 NOTE — Progress Notes (Addendum)
Post op   Chief Complaint  Patient presents with  . Post-op Follow-up    02/01/18 left TKR, injury 03/26/18 staples removed today     Encounter Diagnoses  Name Primary?  . S/P total knee replacement, 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 Yes  . Laceration of left knee, subsequent encounter   . Closed nondisplaced fracture of middle third of scaphoid of left wrist with delayed healing, subsequent encounter    Anti-Biotics were taken since his laceration  Wound looks good staples removed patient can resume normal postoperative course follow-up in mid May  Zachary [name] has indicated that his left wrist is still hurting after the fall  I reexamined it and looked at his x-ray again he has tenderness over the scaphoid scaphoid tubercle anatomic snuffbox painful wrist extension and flexion  Recommend CT scan to rule out scaphoid fracture

## 2018-04-12 ENCOUNTER — Telehealth: Payer: Self-pay | Admitting: Radiology

## 2018-04-12 NOTE — Telephone Encounter (Signed)
Called patient about the CT scan 6pm on Friday April 19th

## 2018-04-14 ENCOUNTER — Ambulatory Visit (HOSPITAL_COMMUNITY): Payer: PPO | Attending: Orthopedic Surgery

## 2018-04-14 DIAGNOSIS — R2689 Other abnormalities of gait and mobility: Secondary | ICD-10-CM

## 2018-04-14 DIAGNOSIS — M25662 Stiffness of left knee, not elsewhere classified: Secondary | ICD-10-CM | POA: Diagnosis not present

## 2018-04-14 DIAGNOSIS — R2681 Unsteadiness on feet: Secondary | ICD-10-CM | POA: Diagnosis not present

## 2018-04-14 NOTE — Therapy (Signed)
[place], [place], [address] Phone: [phone]   Fax:  [phone]  Physical Therapy Evaluation  Patient Details  Name: Zachary [name] MRN: [id number] Date of Birth: 22-Dec-1946 Referring Provider: Arther Abbott MD   Encounter Date: 04/14/2018  PT End of Session - 04/14/18 1158    Visit Number  1    Number of Visits  7    Date for PT Re-Evaluation  05/05/18    Authorization Type  [employer] (no limit, no auth required)    Authorization Time Period  04/14/18-05/05/18    Authorization - Visit Number  1    Authorization - Number of Visits  10    PT Start Time  1119    PT Stop Time  1201    PT Time Calculation (min)  42 min    Activity Tolerance  Patient tolerated treatment well;No increased pain    Behavior During Therapy  WFL for tasks assessed/performed       Past Medical History:  Diagnosis Date  . Anxiety    takes Valium as needed  . Arthritis   . Bladder cancer ([place])    takes Rapaflo daily  . Blood dyscrasia    07/08/16: pt told he was a "free bleeder" after bleeding a lot when derm cut off mole on forehead. No excessive bleeding with minor wounds at home, no bleeding problems perioperatively with prior surgeries.  . Chronic back pain    spondylolisthesis  . Cluster headaches   . Depression    takes Lexapro daily  . Essential hypertension, benign    takes Diovan-HCT daily  . Hx of complications due to general anesthesia    Aborted surgery 07/15/2016 due to hypotension per pt  . Joint pain   . Obstructive sleep apnea on CPAP    uses CPAP @ night  . Pneumonia    "several times"--last time about 3-4 yrs ago  . Prediabetes     Past Surgical History:  Procedure Laterality Date  . ANTERIOR CERVICAL DECOMP/DISCECTOMY FUSION N/A 06/05/2013   Procedure:  C3-4 Anterior Cervical Discectomy and Fusion, Allograft, Plate;  Surgeon: [name], MD;  Location: [facility];  Service: Orthopedics;   Laterality: N/A;  C3-4 Anterior Cervical Discectomy and Fusion, Allograft, Plate  . Arthroscopic knee surgery    . BACK SURGERY      x 2  . BLADDER SURGERY     x 5 to remove tumor  . CARPAL TUNNEL RELEASE    . CATARACT EXTRACTION W/PHACO  12/19/2012   Procedure: CATARACT EXTRACTION PHACO AND INTRAOCULAR LENS PLACEMENT (IOC);  Surgeon: [name], MD;  Location: [facility];  Service: Ophthalmology;  Laterality: Left;  CDE: 26.80  . CATARACT EXTRACTION W/PHACO  01/09/2013   Procedure: CATARACT EXTRACTION PHACO AND INTRAOCULAR LENS PLACEMENT (IOC);  Surgeon: [name], MD;  Location: [facility];  Service: Ophthalmology;  Laterality: Right;  CDE:22.83  . CERVICAL FUSION  06/05/2013   C 3  C4  . CYSTOSCOPY    . REFRACTIVE SURGERY     Hx: of  . RETINAL DETACHMENT SURGERY    . TOTAL KNEE ARTHROPLASTY Left 02/01/2018   Procedure: TOTAL KNEE ARTHROPLASTY;  Surgeon: Carole Civil, MD;  Location: [facility];  Service: Orthopedics;  Laterality: Left;  . TRANSURETHRAL RESECTION OF BLADDER TUMOR Right 07/27/2015   Procedure: TRANSURETHRAL RESECTION OF BLADDER TUMOR (TURBT);  Surgeon: [name], MD;  Location: [facility];  Service: Urology;  Laterality: Right;  .  wisdom tooth extraction      There were no vitals filed for this visit.   Subjective Assessment - 04/14/18 1127    Subjective  Patient reports he had a fall on 03/26/18 when leaving the [facility] for a wedding he was attending. He states he was walking out to the parking lot and he did not see the 3 steps going down as it was dimly lit and brown in color. He states he fell onto his left knee and side and injured his wrist, bruised his ribs, and re-opened the incision from his Lt TKA. He went to the emergency room at [facility] however left to go to [facility] as there were lots of people there wearing face masks. He states he was seen at [facility] ED and the doctor irrigated the wound and then closed it with stitches followed by 15 staples.  He reports his knee is not bothering him now and he has been able to return to some of his activities like mowing the yard and helping with the track team at [employer]. He states his knee has felt stiff but not painful but reports his Lt wrist is hurting when he does activities. He is going for a CT scan tomorrow for his wrist to make sure there is no fracture. He denies pain at this time.    Pertinent History  Fall with incision dehisce on 03/26/18; Left TKA 02/01/18; Spinal fusion 2 years ago; HTN    Limitations  Standing;Walking    How long can you sit comfortably?  Not limited    How long can you stand comfortably?  knee just feels stiff    How long can you walk comfortably?  knee just feels stiff    Diagnostic tests  X-ray 12/15/17: "severe arthritis of the left knee with varus alignment"; [place] 12/23/17: "Segmental exam demonstrates no evidence of significant arterial"; X-ray 02/01/18: "Satisfactory total knee replacement"    Patient Stated Goals  To loosen his Lt knee up and gain back teh ROm he has when last in therapy    Currently in Pain?  No/denies        [facility] PT Assessment - 04/14/18 0001      Assessment   Medical Diagnosis  S/P fall with dehising of incision for Lt TKA    Referring Provider  Arther Abbott MD    Onset Date/Surgical Date  03/26/18 fall (Lt TKA on 02/01/18)    Next MD Visit  04/27/2018      Precautions   Precautions  None    Required Braces or Orthoses  Other Brace/Splint    Other Brace/Splint  wrist brace for Lt hand/wrist - patient is having CT tomorrow for schaphoid injury      Restrictions   Weight Bearing Restrictions  No      Balance Screen   Has the patient fallen in the past 6 months  Yes    How many times?  1    Has the patient had a decrease in activity level because of a fear of falling?   No    Is the patient reluctant to leave their home because of a fear of falling?   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      Observation/Other Assessments   Focus on Therapeutic Outcomes ([place])   -- perform next session      Sensation   Light Touch  Impaired by gross assessment    Additional Comments  Patient reported numbness and decreased sensation superior to patella and along lateral boarder of patella      Posture/Postural Control   Posture/Postural Control  No significant limitations      AROM   Right/Left Knee  Right    Right Knee Extension  2    Right Knee Flexion  128    Left Knee Extension  8    Left Knee Flexion  105      Strength   Right Hip Flexion  4+/5    Right Hip Extension  4+/5    Right Hip ABduction  5/5    Left Hip Flexion  5/5    Left Hip Extension  4+/5    Left Hip ABduction  5/5    Right Knee Flexion  5/5    Right Knee Extension  5/5    Left Knee Flexion  5/5    Left Knee Extension  5/5    Right Ankle Dorsiflexion  5/5    Left Ankle Dorsiflexion  5/5      Palpation   Patella mobility  Lt patella hypomobile      Transfers   Five time sit to stand comments   9.65 seconds, no UE use      Ambulation/Gait   Ambulation/Gait  Yes    Ambulation/Gait Assistance  7: Independent    Ambulation Distance (Feet)  452 Feet 2MWT    Assistive device  None    Gait Pattern  Step-through pattern;Decreased arm swing - right    Ambulation Surface  Level    Gait velocity  1.14 m/s    Stairs  Yes    Stairs Assistance  7: Independent    Stair Management Technique  No rails;One rail Right;Alternating  pattern;Forwards    Number of Stairs  8    Height of Stairs  6    Gait Comments  Patient ableto ascend/descend stairs with no rails with step over step pattern ohwever very unsteady while descending. Balance was improved with 1 hand rail assistance.      Static Standing Balance   Static Standing - Balance Support  No upper extremity supported    Static Standing Balance -  Activities   Single Leg Stance - Left Leg    Static Standing - Comment/# of Minutes  Rt LE = 7 seconds; Lt LE = 6 seconds      Standardized Balance Assessment   Five times sit to stand comments   9.65, no UE       Objective measurements completed on examination: See above findings.    [place] [facility] PT Treatment/Exercise - 04/14/18 0001      Knee/Hip Exercises: Stretches   Passive Hamstring Stretch  30 seconds;2 reps    Passive Hamstring Stretch Limitations  suipne with strap    Knee: Self-Stretch to increase Flexion  5 reps;10 seconds  Knee: Self-Stretch Limitations  seated asssited knee stretch      Knee/Hip Exercises: Supine   Heel Slides  AROM;Left;1 set;15 reps    Heel Slides Limitations  5 second holds        PT Education - 04/14/18 1205    Education provided  Yes    Education Details  Educated on exam findigns in comparison to functional status at [place] last month. Educated on initial HEP to progress towards PLOF.    Person(s) Educated  Patient    Methods  Explanation;Handout    Comprehension  Verbalized understanding;Returned demonstration       PT Short Term Goals - 04/14/18 1215      PT SHORT TERM GOAL #1   Title  Patient will demonstrate understanding and report regular compliance with HEP.    Time  2    Period  Weeks    Status  New    Target Date  04/28/18        PT Long Term Goals - 04/14/18 1215      PT LONG TERM GOAL #1   Title  Patient will improve ROM for left knee extension/flexion to 2-115 degrees to improve ease of gait, sit to stands, squatting, and other functional  mobility.    Time  3    Period  Weeks    Status  New    Target Date  05/05/18      PT LONG TERM GOAL #2   Title  Patient will return to walking 1-2 miles at least 3x/week for regular exercise aroudn teh school track to return to [facility] and improve overall health and activity level.     Time  3    Period  Weeks    Status  New      PT LONG TERM GOAL #3   Title  Patient will improve SLS balance to 15 secodns for Bil LE to improve balance during dynamic gait and stair ambulation and decrease fall risk.    Time  3    Period  Weeks    Status  New         Plan - 04/14/18 1228    Clinical Impression Statement  Mr. Ehrman presents for second encounter for outpatient PT evaluation following a fall on 03/26/18 resulting in dehiscing of his Lt TKA incision. He previously was discharged from PT on 3//19 and had Lt knee ROM from 4-116. He is now limited to 8-105 and reports his knee feels stiffer and he has intermittent pain around his knee. His strength has remained the same and balance is slightly limited for Bil LE. He is currently still working with the football and track team at [employer] and has a history of Parkinson's Disease and reports tremors with fatigue on Rt LE. His current impairments include decreased ROM, decreased flexibility, edema, pain, hypomobility, impaired balance and gait. He will benefit from skilled PT services to address current impairments and progress towards PLOF to improve gait and balance for greater QOL and reduced fall risk.    History and Personal Factors relevant to plan of care:  S/P left TKA 02/01/18; HTN; Parkinson's disease; Spinal fusion 2 years ago; History of bladder cancer; elevated heart rate    Rehab Potential  Good    Clinical Impairments Affecting Rehab Potential  Positive: highly motivated; positive support system; Negative: comorbidities; financial restrictions     PT Frequency  2x / week    PT Duration  3 weeks  PT Treatment/Interventions   ADLs/Self Care Home Management;Cryotherapy;Gait training;Stair training;Functional mobility training;Therapeutic activities;Therapeutic exercise;Balance training;Neuromuscular re-education;Patient/family education;Manual techniques;Compression bandaging;Scar mobilization;Passive range of motion;Energy conservation;Aquatic Therapy    PT Next Visit Plan  Review eval and goals. Initiate stretching and ROM exercise for flexion/extensino limitations. Initiate patella mobilizations and edema management and scar mobilization when healed fully.     PT Home Exercise Plan  Evaluation: heel slides, assisted knee flexion stretch seated, hamstring stretch wtih rope;     Consulted and Agree with Plan of Care  Patient       Patient will benefit from skilled therapeutic intervention in order to improve the following deficits and impairments:  Abnormal gait, Decreased balance, Decreased endurance, Decreased mobility, Difficulty walking, Hypomobility, Impaired sensation, Decreased range of motion, Decreased scar mobility, Increased edema, Improper body mechanics, Decreased activity tolerance, Decreased strength, Impaired flexibility, Pain  Visit Diagnosis: Stiffness of left knee, not elsewhere classified  Other abnormalities of gait and mobility  Unsteadiness on feet     Problem List Patient Active Problem List   Diagnosis Date Noted  . S/P total knee replacement, 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 02/07/2018  . Primary osteoarthritis of left knee 02/01/2018  . Postural hypotension 07/23/2016  . Dysphagia 07/23/2016  . Obstructive sleep apnea 07/21/2016  . Hyperglycemia 07/21/2016  . Anemia, unspecified 07/21/2016  . Spinal stenosis of lumbar region 07/15/2016  . Bladder tumor 07/27/2015  . HNP (herniated nucleus pulposus), cervical 06/05/2013    Class: Diagnosis of  . 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  . Muscle spasms of neck 04/04/2013  . 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 OA (osteoarthritis) of knee  02/22/2013  . Iliotibial band syndrome of left side 11/22/2012  . Localized, primary osteoarthritis of the ankle and foot 10/11/2012  . Difficulty in walking(719.7) 08/17/2012  . Peroneal tendinitis 08/16/2012  . Precordial pain 02/09/2012  . Essential hypertension, benign 02/09/2012  . Mixed hyperlipidemia 02/09/2012  . Dysthymia 02/04/2012  . Abnormality of gait 12/23/2011  . Lateral epicondylitis/tennis elbow 05/06/2011  . TENOSYNOVITIS OF FOOT AND ANKLE 10/22/2010    Kipp Brood, PT, DPT Physical Therapist with [facility]  04/14/2018 1:08 PM    [place], [place], [address] Phone: [phone]   Fax:  [phone]  Name: ABISHAI VIEGAS MRN: [id number] Date of Birth: 1946-11-16

## 2018-04-14 NOTE — Patient Instructions (Signed)
    HAMSTRING STRETCH WITH TOWEL: 2-3 times for 30-60 seconds  While lying down on your back, hook a towel or strap under  your foot and draw up your leg until a stretch is felt along the backside of your leg.   Keep your knee in a straightened position during the stretch.      HEEL SLIDES - SUPINE: 1-2 sets of 15-20 repetitions  Lying on your back with knees straight, slide the affected heel towards your buttock as you bend your knee.   Hold a gentle stretch in this position and then return to original position.     KNEE FLEXION STRETCH - SELF ASSISTED: 2-3 times for 30-60 seconds  While seated in a chair, use your unaffected leg to bend your affected knee until a stretch is felt.

## 2018-04-15 ENCOUNTER — Ambulatory Visit (HOSPITAL_COMMUNITY)
Admission: RE | Admit: 2018-04-15 | Discharge: 2018-04-15 | Disposition: A | Discharge status: Home / Self Care | Payer: PPO | Source: Ambulatory Visit | Attending: Orthopedic Surgery | Admitting: Orthopedic Surgery

## 2018-04-15 DIAGNOSIS — M19042 Primary osteoarthritis, left hand: Secondary | ICD-10-CM | POA: Diagnosis not present

## 2018-04-15 DIAGNOSIS — S62025G Nondisplaced fracture of middle third of navicular [scaphoid] bone of left wrist, subsequent encounter for fracture with delayed healing: Secondary | ICD-10-CM | POA: Diagnosis not present

## 2018-04-15 DIAGNOSIS — M19032 Primary osteoarthritis, left wrist: Secondary | ICD-10-CM | POA: Diagnosis not present

## 2018-04-19 ENCOUNTER — Encounter (HOSPITAL_COMMUNITY): Payer: Self-pay

## 2018-04-19 ENCOUNTER — Other Ambulatory Visit: Payer: Self-pay

## 2018-04-19 ENCOUNTER — Ambulatory Visit (HOSPITAL_COMMUNITY): Payer: PPO

## 2018-04-19 DIAGNOSIS — M25662 Stiffness of left knee, not elsewhere classified: Secondary | ICD-10-CM | POA: Diagnosis not present

## 2018-04-19 DIAGNOSIS — R2689 Other abnormalities of gait and mobility: Secondary | ICD-10-CM

## 2018-04-19 DIAGNOSIS — R2681 Unsteadiness on feet: Secondary | ICD-10-CM

## 2018-04-19 NOTE — Therapy (Signed)
[facility] [place], [place], [address] Phone: [phone]   Fax:  [phone]  Physical Therapy Treatment  Patient Details  Name: Zachary [name] MRN: [id number] Date of Birth: June 02, 1946 Referring Provider: Arther Abbott MD   Encounter Date: 04/19/2018  PT End of Session - 04/19/18 1020    Visit Number  2    Number of Visits  7    Date for PT Re-Evaluation  05/05/18    Authorization Type  [employer] (no limit, no auth required)    Authorization Time Period  04/14/18-05/05/18    Authorization - Visit Number  2    Authorization - Number of Visits  10    PT Start Time  5366    PT Stop Time  4403    PT Time Calculation (min)  45 min    Activity Tolerance  Patient tolerated treatment well;No increased pain    Behavior During Therapy  WFL for tasks assessed/performed       Past Medical History:  Diagnosis Date  . Anxiety    takes Valium as needed  . Arthritis   . Bladder cancer ([place])    takes Rapaflo daily  . Blood dyscrasia    07/08/16: pt told he was a "free bleeder" after bleeding a lot when derm cut off mole on forehead. No excessive bleeding with minor wounds at home, no bleeding problems perioperatively with prior surgeries.  . Chronic back pain    spondylolisthesis  . Cluster headaches   . Depression    takes Lexapro daily  . Essential hypertension, benign    takes Diovan-HCT daily  . Hx of complications due to general anesthesia    Aborted surgery 07/15/2016 due to hypotension per pt  . Joint pain   . Obstructive sleep apnea on CPAP    uses CPAP @ night  . Pneumonia    "several times"--last time about 3-4 yrs ago  . Prediabetes     Past Surgical History:  Procedure Laterality Date  . ANTERIOR CERVICAL DECOMP/DISCECTOMY FUSION N/A 06/05/2013   Procedure:  C3-4 Anterior Cervical Discectomy and Fusion, Allograft, Plate;  Surgeon: [name], MD;  Location: [facility];  Service: Orthopedics;   Laterality: N/A;  C3-4 Anterior Cervical Discectomy and Fusion, Allograft, Plate  . Arthroscopic knee surgery    . BACK SURGERY      x 2  . BLADDER SURGERY     x 5 to remove tumor  . CARPAL TUNNEL RELEASE    . CATARACT EXTRACTION W/PHACO  12/19/2012   Procedure: CATARACT EXTRACTION PHACO AND INTRAOCULAR LENS PLACEMENT (IOC);  Surgeon: [name], MD;  Location: [facility];  Service: Ophthalmology;  Laterality: Left;  CDE: 26.80  . CATARACT EXTRACTION W/PHACO  01/09/2013   Procedure: CATARACT EXTRACTION PHACO AND INTRAOCULAR LENS PLACEMENT (IOC);  Surgeon: [name], MD;  Location: [facility];  Service: Ophthalmology;  Laterality: Right;  CDE:22.83  . CERVICAL FUSION  06/05/2013   C 3  C4  . CYSTOSCOPY    . REFRACTIVE SURGERY     Hx: of  . RETINAL DETACHMENT SURGERY    . TOTAL KNEE ARTHROPLASTY Left 02/01/2018   Procedure: TOTAL KNEE ARTHROPLASTY;  Surgeon: Carole Civil, MD;  Location: [facility];  Service: Orthopedics;  Laterality: Left;  . TRANSURETHRAL RESECTION OF BLADDER TUMOR Right 07/27/2015   Procedure: TRANSURETHRAL RESECTION OF BLADDER TUMOR (TURBT);  Surgeon: [name], MD;  Location: [facility];  Service: Urology;  Laterality: Right;  .  wisdom tooth extraction      There were no vitals filed for this visit.  Subjective Assessment - 04/19/18 1010    Subjective  Patient reports he felt really good at the end of last week and Fri/Sat doing his exercises. He state he felt so good that he thought he would be able to do some yard work yesterday and spent several hours weeding, and picking up twigs around the yard. He reports he carried 7 bags out to be picked up for yard waste and by the end he was really sore and his knee pain was about an 8/10. He denies pain today but does state his knee was achy this morning and feels stiff.     Pertinent History  Fall with incision dehisce on 03/26/18; Left TKA 02/01/18; Spinal fusion 2 years ago; HTN    Limitations  Standing;Walking    How long can  you sit comfortably?  Not limited    How long can you stand comfortably?  knee just feels stiff    How long can you walk comfortably?  knee just feels stiff    Diagnostic tests  X-ray 12/15/17: "severe arthritis of the left knee with varus alignment"; [place] 12/23/17: "Segmental exam demonstrates no evidence of significant arterial"; X-ray 02/01/18: "Satisfactory total knee replacement"    Patient Stated Goals  To loosen his Lt knee up and gain back teh ROm he has when last in therapy    Currently in Pain?  No/denies states it is sore and achy but would not rate        [facility] Adult PT Treatment/Exercise - 04/19/18 0001      Knee/Hip Exercises: Stretches   Passive Hamstring Stretch  Left;3 reps;30 seconds    Passive Hamstring Stretch Limitations  12" step    Knee: Self-Stretch to increase Flexion  Left;3 reps;30 seconds    Knee: Self-Stretch Limitations  12" box    Gastroc Stretch  3 reps;30 seconds    Gastroc Stretch Limitations  slant board      Knee/Hip Exercises: Aerobic   Stationary Bike  recumbent seat 19 for AROM/mobility for 4 minutes, full revolutions      Knee/Hip Exercises: Standing   Terminal Knee Extension  Left;2 sets;15 reps;Theraband    Theraband Level (Terminal Knee Extension)  Level 4 (Blue)    Rocker Board  2 minutes;Limitations    Rocker Board Limitations  lateral      Manual Therapy   Manual Therapy  Joint mobilization;Myofascial release    Manual therapy comments  completed seperately from all other skilled interventions    Joint Mobilization  Patella mobilization sup/inf/med/lat; grade III for 3x30-45 second oscillations    Myofascial Release  scar massage tor educe adhesions from scar tissue and improve mobility        PT Education - 04/19/18 1151    Education provided  Yes    Education Details  reviewed eval and goals. Educated on exercises throughotu session with minimal cuing for form. Edcuated on scar massage tor educe adhesions.    Person(s) Educated   Patient    Methods  Explanation;Handout    Comprehension  Verbalized understanding       PT Short Term Goals - 04/14/18 1215      PT SHORT TERM GOAL #1   Title  Patient will demonstrate understanding and report regular compliance with HEP.    Time  2    Period  Weeks    Status  New    Target  Date  04/28/18        PT Long Term Goals - 04/14/18 1215      PT LONG TERM GOAL #1   Title  Patient will improve ROM for left knee extension/flexion to 2-115 degrees to improve ease of gait, sit to stands, squatting, and other functional mobility.    Time  3    Period  Weeks    Status  New    Target Date  05/05/18      PT LONG TERM GOAL #2   Title  Patient will return to walking 1-2 miles at least 3x/week for regular exercise aroudn teh school track to return to [facility] and improve overall health and activity level.     Time  3    Period  Weeks    Status  New      PT LONG TERM GOAL #3   Title  Patient will improve SLS balance to 15 secodns for Bil LE to improve balance during dynamic gait and stair ambulation and decrease fall risk.    Time  3    Period  Weeks    Status  New         Plan - 04/19/18 1021    Clinical Impression Statement  Initiated exercise for ROM/mobility today. Patient performed stationary bike with full revolutions for Lt knee mobility and reported decreased stiffness after exercises. Lt knee stretching was performed with muscles warmed up and patient experienced some pain on medial knee joint with knee flexion stretch. He was educated to stretch into tolerable range and not push into pain. He demonstrated good quad activation with TKE in standing and required min verbal cues to achieve proper form. He was educated to perform HEP daily and ice after exercise while elevating to reduce edema. He was educated on scar massage to reduce adhesions of scar tissue and improve mobility. He will continue to benefit from skilled PT services to address current impairments and  progress towards PLOF and reduced fall risk.    Rehab Potential  Good    Clinical Impairments Affecting Rehab Potential  Positive: highly motivated; positive support system; Negative: comorbidities; financial restrictions     PT Frequency  2x / week    PT Duration  3 weeks    PT Treatment/Interventions  ADLs/Self Care Home Management;Cryotherapy;Gait training;Stair training;Functional mobility training;Therapeutic activities;Therapeutic exercise;Balance training;Neuromuscular re-education;Patient/family education;Manual techniques;Compression bandaging;Scar mobilization;Passive range of motion;Energy conservation;Aquatic Therapy    PT Next Visit Plan  Continue with stretching and ROM exercise for flexion/extension limitations. Continue with patella mobilizations and edema management and scar mobilization when healed fully. Initiate balance exercises and add to HEP when appropriate.    PT Home Exercise Plan  Evaluation: heel slides, assisted knee flexion stretch seated, hamstring stretch wtih rope;     Consulted and Agree with Plan of Care  Patient       Patient will benefit from skilled therapeutic intervention in order to improve the following deficits and impairments:  Abnormal gait, Decreased balance, Decreased endurance, Decreased mobility, Difficulty walking, Hypomobility, Impaired sensation, Decreased range of motion, Decreased scar mobility, Increased edema, Improper body mechanics, Decreased activity tolerance, Decreased strength, Impaired flexibility, Pain  Visit Diagnosis: Stiffness of left knee, not elsewhere classified  Other abnormalities of gait and mobility  Unsteadiness on feet     Problem List Patient Active Problem List   Diagnosis Date Noted  . S/P total knee replacement, 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 02/07/2018  . Primary osteoarthritis of left knee 02/01/2018  . Postural hypotension  07/23/2016  . Dysphagia 07/23/2016  . Obstructive sleep apnea 07/21/2016  . Hyperglycemia 07/21/2016   . Anemia, unspecified 07/21/2016  . Spinal stenosis of lumbar region 07/15/2016  . Bladder tumor 07/27/2015  . HNP (herniated nucleus pulposus), cervical 06/05/2013    Class: Diagnosis of  . 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  . Muscle spasms of neck 04/04/2013  . 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 OA (osteoarthritis) of knee 02/22/2013  . Iliotibial band syndrome of left side 11/22/2012  . Localized, primary osteoarthritis of the ankle and foot 10/11/2012  . Difficulty in walking(719.7) 08/17/2012  . Peroneal tendinitis 08/16/2012  . Precordial pain 02/09/2012  . Essential hypertension, benign 02/09/2012  . Mixed hyperlipidemia 02/09/2012  . Dysthymia 02/04/2012  . Abnormality of gait 12/23/2011  . Lateral epicondylitis/tennis elbow 05/06/2011  . TENOSYNOVITIS OF FOOT AND ANKLE 10/22/2010    Kipp Brood, PT, DPT Physical Therapist with [facility]  04/19/2018 12:04 PM    [facility] [place], [place], [address] Phone: [phone]   Fax:  [phone]  Name: ARAMIS WEIL MRN: [id number] Date of Birth: June 12, 1946

## 2018-04-21 ENCOUNTER — Encounter (HOSPITAL_COMMUNITY): Payer: Self-pay

## 2018-04-21 ENCOUNTER — Ambulatory Visit (HOSPITAL_COMMUNITY): Payer: PPO

## 2018-04-21 DIAGNOSIS — M25662 Stiffness of left knee, not elsewhere classified: Secondary | ICD-10-CM

## 2018-04-21 DIAGNOSIS — R2689 Other abnormalities of gait and mobility: Secondary | ICD-10-CM

## 2018-04-21 DIAGNOSIS — R2681 Unsteadiness on feet: Secondary | ICD-10-CM

## 2018-04-21 NOTE — Therapy (Signed)
[facility] [place], [place], [address] Phone: [phone]   Fax:  [phone]  Physical Therapy Treatment  Patient Details  Name: Zachary [name] MRN: [id number] Date of Birth: February 05, 1946 Referring Provider: Arther Abbott MD   Encounter Date: 04/21/2018  PT End of Session - 04/21/18 1039    Visit Number  3    Number of Visits  7    Date for PT Re-Evaluation  05/05/18    Authorization Type  [employer] (no limit, no auth required)    Authorization Time Period  04/14/18-05/05/18    Authorization - Visit Number  3    Authorization - Number of Visits  10    PT Start Time  3154    PT Stop Time  1117    PT Time Calculation (min)  42 min    Equipment Utilized During Treatment  Gait belt    Activity Tolerance  Patient tolerated treatment well;No increased pain    Behavior During Therapy  WFL for tasks assessed/performed       Past Medical History:  Diagnosis Date  . Anxiety    takes Valium as needed  . Arthritis   . Bladder cancer ([place])    takes Rapaflo daily  . Blood dyscrasia    07/08/16: pt told he was a "free bleeder" after bleeding a lot when derm cut off mole on forehead. No excessive bleeding with minor wounds at home, no bleeding problems perioperatively with prior surgeries.  . Chronic back pain    spondylolisthesis  . Cluster headaches   . Depression    takes Lexapro daily  . Essential hypertension, benign    takes Diovan-HCT daily  . Hx of complications due to general anesthesia    Aborted surgery 07/15/2016 due to hypotension per pt  . Joint pain   . Obstructive sleep apnea on CPAP    uses CPAP @ night  . Pneumonia    "several times"--last time about 3-4 yrs ago  . Prediabetes     Past Surgical History:  Procedure Laterality Date  . ANTERIOR CERVICAL DECOMP/DISCECTOMY FUSION N/A 06/05/2013   Procedure:  C3-4 Anterior Cervical Discectomy and Fusion, Allograft, Plate;  Surgeon: [name],  MD;  Location: [facility];  Service: Orthopedics;  Laterality: N/A;  C3-4 Anterior Cervical Discectomy and Fusion, Allograft, Plate  . Arthroscopic knee surgery    . BACK SURGERY      x 2  . BLADDER SURGERY     x 5 to remove tumor  . CARPAL TUNNEL RELEASE    . CATARACT EXTRACTION W/PHACO  12/19/2012   Procedure: CATARACT EXTRACTION PHACO AND INTRAOCULAR LENS PLACEMENT (IOC);  Surgeon: [name], MD;  Location: [facility];  Service: Ophthalmology;  Laterality: Left;  CDE: 26.80  . CATARACT EXTRACTION W/PHACO  01/09/2013   Procedure: CATARACT EXTRACTION PHACO AND INTRAOCULAR LENS PLACEMENT (IOC);  Surgeon: [name], MD;  Location: [facility];  Service: Ophthalmology;  Laterality: Right;  CDE:22.83  . CERVICAL FUSION  06/05/2013   C 3  C4  . CYSTOSCOPY    . REFRACTIVE SURGERY     Hx: of  . RETINAL DETACHMENT SURGERY    . TOTAL KNEE ARTHROPLASTY Left 02/01/2018   Procedure: TOTAL KNEE ARTHROPLASTY;  Surgeon: Carole Civil, MD;  Location: [facility];  Service: Orthopedics;  Laterality: Left;  . TRANSURETHRAL RESECTION OF BLADDER TUMOR Right 07/27/2015   Procedure: TRANSURETHRAL RESECTION OF BLADDER TUMOR (TURBT);  Surgeon: [name], MD;  Location: [facility];  Service: Urology;  Laterality: Right;  . wisdom tooth extraction      There were no vitals filed for this visit.  Subjective Assessment - 04/21/18 1037    Subjective  Pt stated he was completeing HEP and got charlie's horse on posterior thigh.  No reoprts of knee pain but charlies horse pain scale 4/10.  No reports of recent fall, did report a close call while moving grass yesterday.      Pertinent History  Fall with incision dehisce on 03/26/18; Left TKA 02/01/18; Spinal fusion 2 years ago; HTN    Patient Stated Goals  To loosen his Lt knee up and gain back teh ROm he has when last in therapy    Currently in Pain?  Yes    Pain Score  4     Pain Location  Knee    Pain Orientation  Left;Posterior    Pain Descriptors / Indicators  Sore  Charlie's horse                       [facility] Adult PT Treatment/Exercise - 04/21/18 0001      Knee/Hip Exercises: Stretches   Passive Hamstring Stretch  Left;3 reps;30 seconds    Passive Hamstring Stretch Limitations  12" step    Knee: Self-Stretch to increase Flexion  Left;10 seconds    Knee: Self-Stretch Limitations  knee drive on 96QI step 29N 10"    Gastroc Stretch  3 reps;30 seconds    Gastroc Stretch Limitations  slant board      Knee/Hip Exercises: Aerobic   Stationary Bike  recumbent seat 17 for AROM/mobility for 3 minutes, full revolutions      Knee/Hip Exercises: Standing   Terminal Knee Extension  Left;2 sets;15 reps;Theraband    Theraband Level (Terminal Knee Extension)  Level 4 (Blue)    SLS  Rt 8", Lt 11" max of 5    SLS with Vectors       Other Standing Knee Exercises  tandem stance 3x 30" on foam      Knee/Hip Exercises: Supine   Knee Extension  AROM    Knee Extension Limitations  5    Knee Flexion  AROM    Knee Flexion Limitations  110      Manual Therapy   Manual Therapy  Joint mobilization;Myofascial release    Manual therapy comments  completed seperately from all other skilled interventions    Joint Mobilization  Patella mobilization sup/inf/med/lat; grade III for 3x30-45 second oscillations    Soft tissue mobilization  soft tissue mobilization to lateral hamstring to resolve "Charlie horse", reduce tightness to assist with extension    Myofascial Release  scar massage tor educe adhesions from scar tissue and improve mobility               PT Short Term Goals - 04/14/18 1215      PT SHORT TERM GOAL #1   Title  Patient will demonstrate understanding and report regular compliance with HEP.    Time  2    Period  Weeks    Status  New    Target Date  04/28/18        PT Long Term Goals - 04/14/18 1215      PT LONG TERM GOAL #1   Title  Patient will improve ROM for left knee extension/flexion to 2-115 degrees to improve ease of  gait, sit to stands, squatting, and other functional mobility.  Time  3    Period  Weeks    Status  New    Target Date  05/05/18      PT LONG TERM GOAL #2   Title  Patient will return to walking 1-2 miles at least 3x/week for regular exercise aroudn teh school track to return to [facility] and improve overall health and activity level.     Time  3    Period  Weeks    Status  New      PT LONG TERM GOAL #3   Title  Patient will improve SLS balance to 15 secodns for Bil LE to improve balance during dynamic gait and stair ambulation and decrease fall risk.    Time  3    Period  Weeks    Status  New            Plan - 04/21/18 1258    Clinical Impression Statement  Session focus iwth knee mobility and additional balance activities this session.  Able to reduce seat for knee mobiltiy to seat 17 (was 19 last session) and continued with prolonged stretches to address tightness.  Manual techniques to address myofascial restrictions proximal knee and additional soft tissue mobilization to resolve "Charlie Horse" pain in lateral hamstring region.  Min A required with balance activities and cueing to improve posture and increase focal awareness to assist with balance.  Pt given additional HEP to address balance activities at home, verbalized understanding of completeing all new balance activities in kitchen or bathroom near counter where can use UE A for safety.  No reoprts of pain at EOS, pt stated charlie horse resolved.  Improved AROM 5-110 degrees.    Rehab Potential  Good    Clinical Impairments Affecting Rehab Potential  Positive: highly motivated; positive support system; Negative: comorbidities; financial restrictions     PT Frequency  2x / week    PT Duration  3 weeks    PT Treatment/Interventions  ADLs/Self Care Home Management;Cryotherapy;Gait training;Stair training;Functional mobility training;Therapeutic activities;Therapeutic exercise;Balance training;Neuromuscular  re-education;Patient/family education;Manual techniques;Compression bandaging;Scar mobilization;Passive range of motion;Energy conservation;Aquatic Therapy    PT Next Visit Plan  Continue with stretching and ROM exercise for flexion/extension limitations. Continue with patella mobilizations and edema management and scar mobilization when healed fully. Continue with balance activities.      PT Home Exercise Plan  Evaluation: heel slides, assisted knee flexion stretch seated, hamstring stretch wtih rope; 4/25: SLS and tandem stance       Patient will benefit from skilled therapeutic intervention in order to improve the following deficits and impairments:  Abnormal gait, Decreased balance, Decreased endurance, Decreased mobility, Difficulty walking, Hypomobility, Impaired sensation, Decreased range of motion, Decreased scar mobility, Increased edema, Improper body mechanics, Decreased activity tolerance, Decreased strength, Impaired flexibility, Pain  Visit Diagnosis: Stiffness of left knee, not elsewhere classified  Other abnormalities of gait and mobility  Unsteadiness on feet     Problem List Patient Active Problem List   Diagnosis Date Noted  . S/P total knee replacement, 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 02/07/2018  . Primary osteoarthritis of left knee 02/01/2018  . Postural hypotension 07/23/2016  . Dysphagia 07/23/2016  . Obstructive sleep apnea 07/21/2016  . Hyperglycemia 07/21/2016  . Anemia, unspecified 07/21/2016  . Spinal stenosis of lumbar region 07/15/2016  . Bladder tumor 07/27/2015  . HNP (herniated nucleus pulposus), cervical 06/05/2013    Class: Diagnosis of  . 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  . Muscle spasms of neck 04/04/2013  . 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 anxiety  03/01/2013  . OA (osteoarthritis) of knee 02/22/2013  . Iliotibial band syndrome of left side 11/22/2012  . Localized, primary osteoarthritis of the ankle and foot 10/11/2012  . Difficulty in walking(719.7) 08/17/2012   . Peroneal tendinitis 08/16/2012  . Precordial pain 02/09/2012  . Essential hypertension, benign 02/09/2012  . Mixed hyperlipidemia 02/09/2012  . Dysthymia 02/04/2012  . Abnormality of gait 12/23/2011  . Lateral epicondylitis/tennis elbow 05/06/2011  . TENOSYNOVITIS OF FOOT AND ANKLE 10/22/2010   Ihor Austin, LPTA; CBIS [phone]  Aldona Lento 04/21/2018, 1:36 PM  [facility] [place], [place], [address] Phone: [phone]   Fax:  [phone]  Name: Zachary [name] MRN: [id number] Date of Birth: 05/03/1946

## 2018-04-21 NOTE — Patient Instructions (Signed)
Tandem Stance    Right foot in front of left, heel touching toe both feet "straight ahead". Stand on Foot Triangle of Support with both feet. Balance in this position  30 seconds. Do with left foot in front of right.  Copyright  VHI. All rights reserved.   SINGLE LIMB STANCE    Stance: single leg on floor. Raise leg. Hold 30 seconds. Repeat with other leg. 5 reps per set, 6 days per week  Copyright  VHI. All rights reserved.

## 2018-04-25 ENCOUNTER — Ambulatory Visit (HOSPITAL_COMMUNITY): Payer: PPO

## 2018-04-25 ENCOUNTER — Encounter (HOSPITAL_COMMUNITY): Payer: Self-pay

## 2018-04-25 ENCOUNTER — Other Ambulatory Visit: Payer: Self-pay

## 2018-04-25 DIAGNOSIS — M25662 Stiffness of left knee, not elsewhere classified: Secondary | ICD-10-CM

## 2018-04-25 DIAGNOSIS — R2689 Other abnormalities of gait and mobility: Secondary | ICD-10-CM

## 2018-04-25 DIAGNOSIS — R2681 Unsteadiness on feet: Secondary | ICD-10-CM

## 2018-04-25 NOTE — Therapy (Signed)
[place], [place], [address] Phone: [phone]   Fax:  [phone]  Physical Therapy Treatment  Patient Details  Name: Zachary [name] MRN: [id number] Date of Birth: 10/25/1946 Referring Provider: Arther Abbott MD   Encounter Date: 04/25/2018  PT End of Session - 04/25/18 0827    Visit Number  4    Number of Visits  7    Date for PT Re-Evaluation  05/05/18    Authorization Type  [employer] (no limit, no auth required)    Authorization Time Period  04/14/18-05/05/18    Authorization - Visit Number  4    Authorization - Number of Visits  10    PT Start Time  0821    PT Stop Time  0902    PT Time Calculation (min)  41 min    Equipment Utilized During Treatment  Gait belt    Activity Tolerance  Patient tolerated treatment well;No increased pain    Behavior During Therapy  WFL for tasks assessed/performed       Past Medical History:  Diagnosis Date  . Anxiety    takes Valium as needed  . Arthritis   . Bladder cancer ([place])    takes Rapaflo daily  . Blood dyscrasia    07/08/16: pt told he was a "free bleeder" after bleeding a lot when derm cut off mole on forehead. No excessive bleeding with minor wounds at home, no bleeding problems perioperatively with prior surgeries.  . Chronic back pain    spondylolisthesis  . Cluster headaches   . Depression    takes Lexapro daily  . Essential hypertension, benign    takes Diovan-HCT daily  . Hx of complications due to general anesthesia    Aborted surgery 07/15/2016 due to hypotension per pt  . Joint pain   . Obstructive sleep apnea on CPAP    uses CPAP @ night  . Pneumonia    "several times"--last time about 3-4 yrs ago  . Prediabetes     Past Surgical History:  Procedure Laterality Date  . ANTERIOR CERVICAL DECOMP/DISCECTOMY FUSION N/A 06/05/2013   Procedure:  C3-4 Anterior Cervical Discectomy and Fusion, Allograft, Plate;  Surgeon: [name],  MD;  Location: [facility];  Service: Orthopedics;  Laterality: N/A;  C3-4 Anterior Cervical Discectomy and Fusion, Allograft, Plate  . Arthroscopic knee surgery    . BACK SURGERY      x 2  . BLADDER SURGERY     x 5 to remove tumor  . CARPAL TUNNEL RELEASE    . CATARACT EXTRACTION W/PHACO  12/19/2012   Procedure: CATARACT EXTRACTION PHACO AND INTRAOCULAR LENS PLACEMENT (IOC);  Surgeon: [name], MD;  Location: [facility];  Service: Ophthalmology;  Laterality: Left;  CDE: 26.80  . CATARACT EXTRACTION W/PHACO  01/09/2013   Procedure: CATARACT EXTRACTION PHACO AND INTRAOCULAR LENS PLACEMENT (IOC);  Surgeon: [name], MD;  Location: [facility];  Service: Ophthalmology;  Laterality: Right;  CDE:22.83  . CERVICAL FUSION  06/05/2013   C 3  C4  . CYSTOSCOPY    . REFRACTIVE SURGERY     Hx: of  . RETINAL DETACHMENT SURGERY    . TOTAL KNEE ARTHROPLASTY Left 02/01/2018   Procedure: TOTAL KNEE ARTHROPLASTY;  Surgeon: Carole Civil, MD;  Location: [facility];  Service: Orthopedics;  Laterality: Left;  . TRANSURETHRAL RESECTION OF BLADDER TUMOR Right 07/27/2015   Procedure: TRANSURETHRAL RESECTION OF BLADDER TUMOR (TURBT);  Surgeon: [name], MD;  [facility]: [facility];  Service: Urology;  Laterality: Right;  . wisdom tooth extraction      There were no vitals filed for this visit.  Subjective Assessment - 04/25/18 0825    Subjective  Patient reports he had a good weekend and that he has not had any more close calls with falling during activities like mowing the lawn. He reports he has been doing his exercises daily and they are going well.    Pertinent History  Fall with incision dehisce on 03/26/18; Left TKA 02/01/18; Spinal fusion 2 years ago; HTN    Limitations  Standing;Walking    Diagnostic tests  X-ray 12/15/17: "severe arthritis of the left knee with varus alignment"; [place] 12/23/17: "Segmental exam demonstrates no evidence of significant arterial"; X-ray 02/01/18: "Satisfactory total knee replacement"     Patient Stated Goals  To loosen his Lt knee up and gain back teh ROm he has when last in therapy    Currently in Pain?  Yes    Pain Score  4     Pain Location  Knee    Pain Orientation  Left    Pain Descriptors / Indicators  Aching;Sore    Pain Type  Surgical pain    Pain Onset  More than a month ago    Pain Frequency  Constant         [facility] Adult PT Treatment/Exercise - 04/25/18 0001      Knee/Hip Exercises: Stretches   Passive Hamstring Stretch  Left;3 reps;30 seconds    Passive Hamstring Stretch Limitations  12" step    Knee: Self-Stretch to increase Flexion  Left;10 seconds    Knee: Self-Stretch Limitations  knee drive on 56LO step 75I 10"    Gastroc Stretch  3 reps;30 seconds    Gastroc Stretch Limitations  slant board      Knee/Hip Exercises: Aerobic   Stationary Bike  recumbent seat 16 for AROM/mobility for 4 minutes, full revolutions      Knee/Hip Exercises: Standing   Forward Lunges  Right;Left;15 reps;Other (comment);Limitations    Forward Lunges Limitations  BOSU ball side up    Terminal Knee Extension  Left;2 sets;15 reps;Theraband    Theraband Level (Terminal Knee Extension)  Level 4 (Blue)    Functional Squat  2 sets;10 reps;Limitations      Knee/Hip Exercises: Supine   Knee Extension  AROM    Knee Extension Limitations  5    Knee Flexion  AROM    Knee Flexion Limitations  110       Balance Exercises - 04/25/18 0855      Balance Exercises: Standing   Tandem Stance  Eyes open;Foam/compliant surface;5 reps;10 secs alternating foot alignment    Standing, One Foot on a Step  Eyes open;2 inch;10 secs;3 reps 3 reps each foot        PT Education - 04/25/18 0827    Education provided  Yes    Education Details  Educated on new exercises this session and on strategies for maintaining balance during exercises.    Person(s) Educated  Patient    Methods  Explanation    Comprehension  Verbalized understanding       PT Short Term Goals - 04/14/18 1215       PT SHORT TERM GOAL #1   Title  Patient will demonstrate understanding and report regular compliance with HEP.    Time  2    Period  Weeks    Status  New    Target  Date  04/28/18        PT Long Term Goals - 04/14/18 1215      PT LONG TERM GOAL #1   Title  Patient will improve ROM for left knee extension/flexion to 2-115 degrees to improve ease of gait, sit to stands, squatting, and other functional mobility.    Time  3    Period  Weeks    Status  New    Target Date  05/05/18      PT LONG TERM GOAL #2   Title  Patient will return to walking 1-2 miles at least 3x/week for regular exercise aroudn teh school track to return to [facility] and improve overall health and activity level.     Time  3    Period  Weeks    Status  New      PT LONG TERM GOAL #3   Title  Patient will improve SLS balance to 15 secodns for Bil LE to improve balance during dynamic gait and stair ambulation and decrease fall risk.    Time  3    Period  Weeks    Status  New        Plan - 04/25/18 0827    Clinical Impression Statement  Patient is making good progress in therapy and AROM on Lt knee is currently 5-110 degrees. Therapy continues to focus on ROM and mobility and at start of session patient began with bike to reduce stiffness in Lt knee. He continues to perform standing functional exercises and progressed today with lunges and squats this session. He requires verbal cues and demonstration to achieve proper squat form. He continues to report good compliance with HEP and has initiated scar mobilization at home He will continue to benefit from skilled PT services to address current impairments and progress to PLOF.    Rehab Potential  Good    Clinical Impairments Affecting Rehab Potential  Positive: highly motivated; positive support system; Negative: comorbidities; financial restrictions     PT Frequency  2x / week    PT Duration  3 weeks    PT Treatment/Interventions  ADLs/Self Care Home  Management;Cryotherapy;Gait training;Stair training;Functional mobility training;Therapeutic activities;Therapeutic exercise;Balance training;Neuromuscular re-education;Patient/family education;Manual techniques;Compression bandaging;Scar mobilization;Passive range of motion;Energy conservation;Aquatic Therapy    PT Next Visit Plan  Continue with stretching and ROM exercise for flexion/extension limitations. Continue with patella mobilizations and edema management and scar mobilization when healed fully. Continue with balance activities.  Educate on self patella mobilization.    PT Home Exercise Plan  Evaluation: heel slides, assisted knee flexion stretch seated, hamstring stretch wtih rope; 4/25: SLS and tandem stance    Consulted and Agree with Plan of Care  Patient       Patient will benefit from skilled therapeutic intervention in order to improve the following deficits and impairments:  Abnormal gait, Decreased balance, Decreased endurance, Decreased mobility, Difficulty walking, Hypomobility, Impaired sensation, Decreased range of motion, Decreased scar mobility, Increased edema, Improper body mechanics, Decreased activity tolerance, Decreased strength, Impaired flexibility, Pain  Visit Diagnosis: Stiffness of left knee, not elsewhere classified  Other abnormalities of gait and mobility  Unsteadiness on feet     Problem List Patient Active Problem List   Diagnosis Date Noted  . S/P total knee replacement, 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 02/07/2018  . Primary osteoarthritis of left knee 02/01/2018  . Postural hypotension 07/23/2016  . Dysphagia 07/23/2016  . Obstructive sleep apnea 07/21/2016  . Hyperglycemia 07/21/2016  . Anemia, unspecified 07/21/2016  . Spinal stenosis of  lumbar region 07/15/2016  . Bladder tumor 07/27/2015  . HNP (herniated nucleus pulposus), cervical 06/05/2013    Class: Diagnosis of  . 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  . Muscle spasms of neck 04/04/2013  . Insomnia secondary to  depression with anxiety 03/01/2013  . OA (osteoarthritis) of knee 02/22/2013  . Iliotibial band syndrome of left side 11/22/2012  . Localized, primary osteoarthritis of the ankle and foot 10/11/2012  . Difficulty in walking(719.7) 08/17/2012  . Peroneal tendinitis 08/16/2012  . Precordial pain 02/09/2012  . Essential hypertension, benign 02/09/2012  . Mixed hyperlipidemia 02/09/2012  . Dysthymia 02/04/2012  . Abnormality of gait 12/23/2011  . Lateral epicondylitis/tennis elbow 05/06/2011  . TENOSYNOVITIS OF FOOT AND ANKLE 10/22/2010    Kipp Brood, PT, DPT Physical Therapist with [facility]  04/25/2018 9:04 AM    [place], [place], [address] Phone: [phone]   Fax:  [phone]  Name: Zachary [name] MRN: [id number] Date of Birth: [birth year]

## 2018-04-27 ENCOUNTER — Encounter: Payer: Self-pay | Admitting: Orthopedic Surgery

## 2018-04-27 ENCOUNTER — Ambulatory Visit: Payer: PPO | Admitting: Orthopedic Surgery

## 2018-04-27 ENCOUNTER — Ambulatory Visit (HOSPITAL_COMMUNITY): Payer: PPO | Attending: Orthopedic Surgery

## 2018-04-27 ENCOUNTER — Encounter (HOSPITAL_COMMUNITY): Payer: Self-pay

## 2018-04-27 ENCOUNTER — Other Ambulatory Visit: Payer: Self-pay

## 2018-04-27 VITALS — BP 132/83 | HR 110 | Ht 72.0 in | Wt 227.0 lb

## 2018-04-27 DIAGNOSIS — R2689 Other abnormalities of gait and mobility: Secondary | ICD-10-CM | POA: Insufficient documentation

## 2018-04-27 DIAGNOSIS — M25662 Stiffness of left knee, not elsewhere classified: Secondary | ICD-10-CM | POA: Insufficient documentation

## 2018-04-27 DIAGNOSIS — M6281 Muscle weakness (generalized): Secondary | ICD-10-CM | POA: Insufficient documentation

## 2018-04-27 DIAGNOSIS — Z96652 Presence of left artificial knee joint: Secondary | ICD-10-CM

## 2018-04-27 DIAGNOSIS — R2681 Unsteadiness on feet: Secondary | ICD-10-CM | POA: Insufficient documentation

## 2018-04-27 DIAGNOSIS — S62112D Displaced fracture of triquetrum [cuneiform] bone, left wrist, subsequent encounter for fracture with routine healing: Secondary | ICD-10-CM | POA: Diagnosis not present

## 2018-04-27 NOTE — Therapy (Signed)
[place], [place], [address] Phone: [phone]   Fax:  [phone]  Physical Therapy Treatment  Patient Details  Name: Zachary [name] MRN: [id number] Date of Birth: 01-Dec-1946 Referring Provider: Arther Abbott MD   Encounter Date: 04/27/2018  PT End of Session - 04/27/18 0838    Visit Number  5    Number of Visits  7    Date for PT Re-Evaluation  05/05/18    Authorization Type  [employer] (no limit, no auth required)    Authorization Time Period  04/14/18-05/05/18    Authorization - Visit Number  5    Authorization - Number of Visits  10    PT Start Time  0819    PT Stop Time  0858    PT Time Calculation (min)  39 min    Equipment Utilized During Treatment  Gait belt    Activity Tolerance  Patient tolerated treatment well;No increased pain    Behavior During Therapy  WFL for tasks assessed/performed       Past Medical History:  Diagnosis Date  . Anxiety    takes Valium as needed  . Arthritis   . Bladder cancer ([place])    takes Rapaflo daily  . Blood dyscrasia    07/08/16: pt told he was a "free bleeder" after bleeding a lot when derm cut off mole on forehead. No excessive bleeding with minor wounds at home, no bleeding problems perioperatively with prior surgeries.  . Chronic back pain    spondylolisthesis  . Cluster headaches   . Depression    takes Lexapro daily  . Essential hypertension, benign    takes Diovan-HCT daily  . Hx of complications due to general anesthesia    Aborted surgery 07/15/2016 due to hypotension per pt  . Joint pain   . Obstructive sleep apnea on CPAP    uses CPAP @ night  . Pneumonia    "several times"--last time about 3-4 yrs ago  . Prediabetes     Past Surgical History:  Procedure Laterality Date  . ANTERIOR CERVICAL DECOMP/DISCECTOMY FUSION N/A 06/05/2013   Procedure:  C3-4 Anterior Cervical Discectomy and Fusion, Allograft, Plate;  Surgeon: [name], MD;   Location: [facility];  Service: Orthopedics;  Laterality: N/A;  C3-4 Anterior Cervical Discectomy and Fusion, Allograft, Plate  . Arthroscopic knee surgery    . BACK SURGERY      x 2  . BLADDER SURGERY     x 5 to remove tumor  . CARPAL TUNNEL RELEASE    . CATARACT EXTRACTION W/PHACO  12/19/2012   Procedure: CATARACT EXTRACTION PHACO AND INTRAOCULAR LENS PLACEMENT (IOC);  Surgeon: [name], MD;  [facility];  Service: Ophthalmology;  Laterality: Left;  CDE: 26.80  . CATARACT EXTRACTION W/PHACO  01/09/2013   Procedure: CATARACT EXTRACTION PHACO AND INTRAOCULAR LENS PLACEMENT (IOC);  Surgeon: [name], MD;  [facility];  Service: Ophthalmology;  Laterality: Right;  CDE:22.83  . CERVICAL FUSION  06/05/2013   C 3  C4  . CYSTOSCOPY    . REFRACTIVE SURGERY     Hx: of  . RETINAL DETACHMENT SURGERY    . TOTAL KNEE ARTHROPLASTY Left 02/01/2018   Procedure: TOTAL KNEE ARTHROPLASTY;  Surgeon: Carole Civil, MD;  [facility]: [facility];  Service: Orthopedics;  Laterality: Left;  . TRANSURETHRAL RESECTION OF BLADDER TUMOR Right 07/27/2015   Procedure: TRANSURETHRAL RESECTION OF BLADDER TUMOR (TURBT);  Surgeon: [name], MD;  [facility];  Service: Urology;  Laterality: Right;  . wisdom tooth extraction      There were no vitals filed for this visit.  Subjective Assessment - 04/27/18 0821    Subjective  Patient reports he had a good day yesterday and did his exercises. He reports he has not been walking around the track yet but has been doing a lot of work at his home and yard work with no problems. He states he has been doing his tretches regularly and has not been having pain but does wake up with stiffness every morning.  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      Pertinent History  Fall with incision dehisce on 03/26/18; Left TKA 02/01/18; Spinal fusion 2 years ago; HTN    Limitations  Standing;Walking    Diagnostic tests   X-ray 12/15/17: "severe arthritis of the left knee with varus alignment"; [place] 12/23/17: "Segmental exam demonstrates no evidence of significant arterial"; X-ray 02/01/18: "Satisfactory total knee replacement"    Patient Stated Goals  To loosen his Lt knee up and gain back the ROM he has when last in therapy    Currently in Pain?  No/denies just stiff       [facility] Adult PT Treatment/Exercise - 04/27/18 0001      Knee/Hip Exercises: Stretches   Passive Hamstring Stretch  Left;3 reps;30 seconds    Passive Hamstring Stretch Limitations  12" step    Knee: Self-Stretch to increase Flexion  Left;3 reps;30 seconds    Knee: Self-Stretch Limitations  knee drive on 33AQ step      Knee/Hip Exercises: Aerobic   Stationary Bike  recumbent seat 16 for AROM/mobility for 4 minutes, full revolutions      Knee/Hip Exercises: Standing   Forward Lunges  Right;Left;15 reps;Other (comment);Limitations;1 set    Forward Lunges Limitations  BOSU ball side up    Terminal Knee Extension  Left;2 sets;15 reps;Theraband    Theraband Level (Terminal Knee Extension)  Level 4 (Blue)    Functional Squat  2 sets;10 reps;Limitations    Functional Squat Limitations  chair with 2" foam,     Rocker Board  Limitations;4 minutes    Rocker Board Limitations  2x 1 minute laterl hold balance for midline; 2x 1 minute ant/post intermittent UE support in // bars        PT Education - 04/27/18 0838    Education provided  Yes    Education Details  Educated on exercises throughout session and on balance strategies. Updated HEP. Encouraged to begin walking around teh track again.     Person(s) Educated  Patient    Methods  Explanation;Demonstration;Handout    Comprehension  Verbalized understanding;Returned demonstration       PT Short Term Goals - 04/14/18 1215      PT SHORT TERM GOAL #1   Title  Patient will demonstrate understanding and report regular compliance with HEP.    Time  2    Period  Weeks    Status  New    Target  Date  04/28/18        PT Long Term Goals - 04/14/18 1215      PT LONG TERM GOAL #1   Title  Patient will improve ROM for left knee extension/flexion to 2-115 degrees to improve ease of gait, sit to stands, squatting, and other functional mobility.    Time  3    Period  Weeks  Status  New    Target Date  05/05/18      PT LONG TERM GOAL #2   Title  Patient will return to walking 1-2 miles at least 3x/week for regular exercise aroudn teh school track to return to [facility] and improve overall health and activity level.     Time  3    Period  Weeks    Status  New      PT LONG TERM GOAL #3   Title  Patient will improve SLS balance to 15 secodns for Bil LE to improve balance during dynamic gait and stair ambulation and decrease fall risk.    Time  3    Period  Weeks    Status  New        Plan - 04/27/18 0839    Clinical Impression Statement  Today's session focused on mobility and balance training. Patient arrived reporting stiffness in Lt knee and session began with bike to reduce stiffness as he has reported positive response to this with prior sessions. He continues to demonstrate good strength and form with functional exercises and continued with lunges and squats this session requiring fewer cues for squat form. His HEP was updated with TKE for extension ROM and isolated quad strengthening. I encouraged him to begin walking around the track to progress towards his PLOF. He continues to report good compliance with HEP and has initiated scar mobilization at home He will continue to benefit from skilled PT services to address current impairments and progress to PLOF.    Rehab Potential  Good    Clinical Impairments Affecting Rehab Potential  Positive: highly motivated; positive support system; Negative: comorbidities; financial restrictions     PT Frequency  2x / week    PT Duration  3 weeks    PT Treatment/Interventions  ADLs/Self Care Home Management;Cryotherapy;Gait training;Stair  training;Functional mobility training;Therapeutic activities;Therapeutic exercise;Balance training;Neuromuscular re-education;Patient/family education;Manual techniques;Compression bandaging;Scar mobilization;Passive range of motion;Energy conservation;Aquatic Therapy    PT Next Visit Plan  Continue with stretching and ROM exercise for flexion/extension limitations. Continue with patella mobilizations and edema management and scar mobilization when healed fully. Continue with balance activities.  Educate on self patella mobilization.    PT Home Exercise Plan  Evaluation: heel slides, assisted knee flexion stretch seated, hamstring stretch wtih rope; 4/25: SLS and tandem stance    Consulted and Agree with Plan of Care  Patient       Patient will benefit from skilled therapeutic intervention in order to improve the following deficits and impairments:  Abnormal gait, Decreased balance, Decreased endurance, Decreased mobility, Difficulty walking, Hypomobility, Impaired sensation, Decreased range of motion, Decreased scar mobility, Increased edema, Improper body mechanics, Decreased activity tolerance, Decreased strength, Impaired flexibility, Pain  Visit Diagnosis: Stiffness of left knee, not elsewhere classified  Other abnormalities of gait and mobility  Unsteadiness on feet     Problem List Patient Active Problem List   Diagnosis Date Noted  . S/P total knee replacement, 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 02/07/2018  . Primary osteoarthritis of left knee 02/01/2018  . Postural hypotension 07/23/2016  . Dysphagia 07/23/2016  . Obstructive sleep apnea 07/21/2016  . Hyperglycemia 07/21/2016  . Anemia, unspecified 07/21/2016  . Spinal stenosis of lumbar region 07/15/2016  . Bladder tumor 07/27/2015  . HNP (herniated nucleus pulposus), cervical 06/05/2013    Class: Diagnosis of  . 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  . Muscle spasms of neck 04/04/2013  . 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 OA  (osteoarthritis) of knee  02/22/2013  . Iliotibial band syndrome of left side 11/22/2012  . Localized, primary osteoarthritis of the ankle and foot 10/11/2012  . Difficulty in walking(719.7) 08/17/2012  . Peroneal tendinitis 08/16/2012  . Precordial pain 02/09/2012  . Essential hypertension, benign 02/09/2012  . Mixed hyperlipidemia 02/09/2012  . Dysthymia 02/04/2012  . Abnormality of gait 12/23/2011  . Lateral epicondylitis/tennis elbow 05/06/2011  . TENOSYNOVITIS OF FOOT AND ANKLE 10/22/2010    Kipp Brood, PT, DPT Physical Therapist with [facility]  04/27/2018 9:11 AM    [place], [place], [address] Phone: [phone]   Fax:  [phone]  Name: KEAHI MCCARNEY MRN: [id number] Date of Birth: 09/19/1946

## 2018-04-27 NOTE — Progress Notes (Signed)
Progress Note   Patient ID: Zachary [name], male   DOB: 1946/09/17, 72 y.o.   MRN: [id number]  Chief Complaint  Patient presents with  . Post-op Follow-up    left total knee replacement 02/01/18  . Wrist Injury    03/26/18 fall injuring left knee and left wrist  . Results    review CT scan left wrist      Medical decision-making Encounter Diagnoses  Name Primary?  . S/P total knee replacement, 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 Yes  . Closed chip fracture of left triquetrum with routine healing, subsequent encounter     CT scan imaging multiple images show a triquetral fracture best seen on image #45 series 6.  I agree that there are multiple areas of arthritis but the area of the triquetrum has a fracture line running through it and the patient is clinically tender at that area  No orders of the defined types were placed in this encounter.    PLAN: Recommend bracing as needed for pain, continue therapy follow-up as scheduled for postop total knee    Chief Complaint  Patient presents with  . Post-op Follow-up    left total knee replacement 02/01/18  . Wrist Injury    03/26/18 fall injuring left knee and left wrist  . Results    review CT scan left wrist     HPI Left wrist getting better has pain over the ulnar side of the wrist over the triquetrum  ROS Current Meds  Medication Sig  . acetaminophen (TYLENOL) 500 MG tablet Take 500-1,000 mg by mouth every 6 (six) hours as needed (for pain.).  Marland Kitchen aspirin 81 MG chewable tablet Chew 1 tablet (81 mg total) by mouth 2 (two) times daily.  . carbidopa-levodopa (SINEMET IR) 25-100 MG tablet Take 1 tablet by mouth 3 (three) times daily before meals.  . carboxymethylcellulose (REFRESH PLUS) 0.5 % SOLN Place 1 drop into both eyes 3 (three) times daily as needed (for dry eyes.).   Marland Kitchen diazepam (VALIUM) 5 MG tablet Take 1 tablet (5 mg total) by mouth 2 (two) times daily as needed. for anxiety  . DULoxetine (CYMBALTA) 60 MG capsule TAKE 1 CAPSULE(60 MG) BY MOUTH  DAILY  . fluticasone (FLONASE) 50 MCG/ACT nasal spray Place 1-2 sprays into both nostrils daily as needed (for allergies.).   Marland Kitchen ibuprofen (ADVIL,MOTRIN) 200 MG tablet Take 400 mg by mouth every 8 (eight) hours as needed (for pain.).  Marland Kitchen valsartan-hydrochlorothiazide (DIOVAN-HCT) 320-12.5 MG tablet Take 1 tablet by mouth daily.    Allergies  Allergen Reactions  . Sulfonamide Derivatives Itching, Rash and Other (See Comments)    welts     BP 132/83   Pulse (!) 110   Ht 6' (1.829 m)   Wt 227 lb (103 kg)   BMI 30.79 kg/m   Physical Exam Tenderness to palpation over the left dorsal wrist at the triquetrum consistent with CT scan findings of fracture   Arther Abbott, MD  Arther Abbott, MD 04/27/2018 10:01 AM

## 2018-04-29 DIAGNOSIS — G629 Polyneuropathy, unspecified: Secondary | ICD-10-CM | POA: Diagnosis not present

## 2018-04-29 DIAGNOSIS — I10 Essential (primary) hypertension: Secondary | ICD-10-CM | POA: Diagnosis not present

## 2018-04-29 DIAGNOSIS — F339 Major depressive disorder, recurrent, unspecified: Secondary | ICD-10-CM | POA: Diagnosis not present

## 2018-04-29 DIAGNOSIS — G3184 Mild cognitive impairment, so stated: Secondary | ICD-10-CM | POA: Diagnosis not present

## 2018-04-29 DIAGNOSIS — F419 Anxiety disorder, unspecified: Secondary | ICD-10-CM | POA: Diagnosis not present

## 2018-04-29 DIAGNOSIS — R000 Tachycardia, unspecified: Secondary | ICD-10-CM | POA: Diagnosis not present

## 2018-04-29 DIAGNOSIS — E1165 Type 2 diabetes mellitus with hyperglycemia: Secondary | ICD-10-CM | POA: Diagnosis not present

## 2018-04-29 DIAGNOSIS — D508 Other iron deficiency anemias: Secondary | ICD-10-CM | POA: Diagnosis not present

## 2018-04-29 DIAGNOSIS — Z9889 Other specified postprocedural states: Secondary | ICD-10-CM | POA: Diagnosis not present

## 2018-04-29 DIAGNOSIS — R7301 Impaired fasting glucose: Secondary | ICD-10-CM | POA: Diagnosis not present

## 2018-04-29 DIAGNOSIS — E782 Mixed hyperlipidemia: Secondary | ICD-10-CM | POA: Diagnosis not present

## 2018-04-29 DIAGNOSIS — Z96651 Presence of right artificial knee joint: Secondary | ICD-10-CM | POA: Diagnosis not present

## 2018-05-03 ENCOUNTER — Ambulatory Visit (HOSPITAL_COMMUNITY): Payer: PPO

## 2018-05-03 ENCOUNTER — Other Ambulatory Visit: Payer: Self-pay

## 2018-05-03 ENCOUNTER — Encounter (HOSPITAL_COMMUNITY): Payer: Self-pay

## 2018-05-03 DIAGNOSIS — M25662 Stiffness of left knee, not elsewhere classified: Secondary | ICD-10-CM | POA: Diagnosis not present

## 2018-05-03 DIAGNOSIS — R2689 Other abnormalities of gait and mobility: Secondary | ICD-10-CM

## 2018-05-03 DIAGNOSIS — R2681 Unsteadiness on feet: Secondary | ICD-10-CM

## 2018-05-03 DIAGNOSIS — M6281 Muscle weakness (generalized): Secondary | ICD-10-CM

## 2018-05-03 NOTE — Therapy (Signed)
[place], [place], [address] Phone: [phone]   Fax:  [phone]  Physical Therapy Treatment  Patient Details  Name: Zachary [name] MRN: [id number] Date of Birth: [birth year] Referring Provider: Arther Abbott MD   Encounter Date: 05/03/2018  PT End of Session - 05/03/18 0835    Visit Number  6    Number of Visits  7    Date for PT Re-Evaluation  05/05/18    Authorization Type  [employer] (no limit, no auth required)    Authorization Time Period  04/14/18-05/05/18    Authorization - Visit Number  6    Authorization - Number of Visits  10    PT Start Time  0818    PT Stop Time  0900    PT Time Calculation (min)  42 min    Equipment Utilized During Treatment  Gait belt    Activity Tolerance  Patient tolerated treatment well;No increased pain    Behavior During Therapy  WFL for tasks assessed/performed       Past Medical History:  Diagnosis Date  . Anxiety    takes Valium as needed  . Arthritis   . Bladder cancer ([place])    takes Rapaflo daily  . Blood dyscrasia    07/08/16: pt told he was a "free bleeder" after bleeding a lot when derm cut off mole on forehead. No excessive bleeding with minor wounds at home, no bleeding problems perioperatively with prior surgeries.  . Chronic back pain    spondylolisthesis  . Cluster headaches   . Depression    takes Lexapro daily  . Essential hypertension, benign    takes Diovan-HCT daily  . Hx of complications due to general anesthesia    Aborted surgery 07/15/2016 due to hypotension per pt  . Joint pain   . Obstructive sleep apnea on CPAP    uses CPAP @ night  . Pneumonia    "several times"--last time about 3-4 yrs ago  . Prediabetes     Past Surgical History:  Procedure Laterality Date  . ANTERIOR CERVICAL DECOMP/DISCECTOMY FUSION N/A 06/05/2013   Procedure:  C3-4 Anterior Cervical Discectomy and Fusion, Allograft, Plate;  Surgeon: [name], MD;   Location: [facility];  Service: Orthopedics;  Laterality: N/A;  C3-4 Anterior Cervical Discectomy and Fusion, Allograft, Plate  . Arthroscopic knee surgery    . BACK SURGERY      x 2  . BLADDER SURGERY     x 5 to remove tumor  . CARPAL TUNNEL RELEASE    . CATARACT EXTRACTION W/PHACO  12/19/2012   Procedure: CATARACT EXTRACTION PHACO AND INTRAOCULAR LENS PLACEMENT (IOC);  Surgeon: [name], MD;  [facility];  Service: Ophthalmology;  Laterality: Left;  CDE: 26.80  . CATARACT EXTRACTION W/PHACO  01/09/2013   Procedure: CATARACT EXTRACTION PHACO AND INTRAOCULAR LENS PLACEMENT (IOC);  Surgeon: [name], MD;  Location: [facility];  Service: Ophthalmology;  Laterality: Right;  CDE:22.83  . CERVICAL FUSION  06/05/2013   C 3  C4  . CYSTOSCOPY    . REFRACTIVE SURGERY     Hx: of  . RETINAL DETACHMENT SURGERY    . TOTAL KNEE ARTHROPLASTY Left 02/01/2018   Procedure: TOTAL KNEE ARTHROPLASTY;  Surgeon: Carole Civil, MD;  Location: [facility];  Service: Orthopedics;  Laterality: Left;  . TRANSURETHRAL RESECTION OF BLADDER TUMOR Right 07/27/2015   Procedure: TRANSURETHRAL RESECTION OF BLADDER TUMOR (TURBT);  Surgeon: [name], MD;  Location: [facility];  Service: Urology;  Laterality: Right;  . wisdom tooth extraction      There were no vitals filed for this visit.  Subjective Assessment - 05/03/18 0823    Subjective  He denies any problems with new exercises for HEP. He walked one mile on the track since last visit without a problem. 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    Pertinent History  Fall with incision dehisce on 03/26/18; Left TKA 02/01/18; Spinal fusion 2 years ago; HTN    Limitations  Standing;Walking    Diagnostic tests  X-ray 12/15/17: "severe arthritis of the left knee with varus alignment"; [place]  12/23/17: "Segmental exam demonstrates no evidence of significant arterial"; X-ray 02/01/18: "Satisfactory total knee replacement"    Patient Stated Goals  To loosen his Lt knee up and gain back the ROM he has when last in therapy    Currently in Pain?  No/denies        [facility] Adult PT Treatment/Exercise - 05/03/18 0001      Knee/Hip Exercises: Stretches   Quad Stretch  Left;20 seconds;3 reps;Limitations    Quad Stretch Limitations  prone with rope      Knee/Hip Exercises: Aerobic   Stationary Bike  recumbent seat 16 for AROM/mobility for 4 minutes, full revolutions      Knee/Hip Exercises: Standing   Forward Lunges  --    Forward Lunges Limitations  --    Step Down  10 reps;Hand Hold: 1;Left;1 set;Step Height: 4";Right    Rocker Board  Limitations;4 minutes    Rocker Board Limitations  2x 1 minute laterl hold balance for midline; 2x 1 minute ant/post      Knee/Hip Exercises: Supine   Knee Extension  AROM    Knee Extension Limitations  5    Knee Flexion  AROM    Knee Flexion Limitations  114      Knee/Hip Exercises: Prone   Contract/Relax to Increase Flexion  3x 5-8 seconds contract/30 secodns relax for quad stretch       Balance Exercises - 05/03/18 0829      Balance Exercises: Standing   Tandem Stance  Eyes open;Foam/compliant surface;15 secs;4 reps 8 reps total, alternating foot alignment    SLS  Eyes open;Solid surface;2 reps;10 secs    SLS with Vectors  Solid surface;5 reps;10 secs;Intermittent upper extremity assist;Limitations    Standing, One Foot on a Step  Eyes open;Foam/compliant surface;4 inch        PT Education - 05/03/18 0825    Education provided  Yes    Education Details  Educated on exercises throughout session and encouraged to continue walking on track to return to PLOF.    Person(s) Educated  Patient    Methods  Explanation    Comprehension  Verbalized understanding       PT Short Term Goals - 04/14/18 1215      PT SHORT TERM GOAL #1   Title   Patient will demonstrate understanding and report regular compliance with HEP.    Time  2    Period  Weeks    Status  New    Target Date  04/28/18        PT Long Term Goals - 04/14/18 1215      PT  LONG TERM GOAL #1   Title  Patient will improve ROM for left knee extension/flexion to 2-115 degrees to improve ease of gait, sit to stands, squatting, and other functional mobility.    Time  3    Period  Weeks    Status  New    Target Date  05/05/18      PT LONG TERM GOAL #2   Title  Patient will return to walking 1-2 miles at least 3x/week for regular exercise aroudn teh school track to return to [facility] and improve overall health and activity level.     Time  3    Period  Weeks    Status  New      PT LONG TERM GOAL #3   Title  Patient will improve SLS balance to 15 secodns for Bil LE to improve balance during dynamic gait and stair ambulation and decrease fall risk.    Time  3    Period  Weeks    Status  New        Plan - 05/03/18 0835    Clinical Impression Statement  Session focused on balance training and mobility exercises today with initiation of contract relax stretching for Lt knee. He has improved balance for SLS and was able to maintain balance for ~ 10 seconds in // bars but continue to be unsteady with excessive postural sway and equilibrium reactions. I continued to encourage him to begin walking around the track to progress towards his PLOF. Lt knee ROM today was 5-114. He will continue to benefit from skilled PT services to address current impairments and progress to PLOF.    Rehab Potential  Good    Clinical Impairments Affecting Rehab Potential  Positive: highly motivated; positive support system; Negative: comorbidities; financial restrictions     PT Frequency  2x / week    PT Duration  3 weeks    PT Treatment/Interventions  ADLs/Self Care Home Management;Cryotherapy;Gait training;Stair training;Functional mobility training;Therapeutic activities;Therapeutic  exercise;Balance training;Neuromuscular re-education;Patient/family education;Manual techniques;Compression bandaging;Scar mobilization;Passive range of motion;Energy conservation;Aquatic Therapy    PT Next Visit Plan  Re-assess next session. Continue with stretching and ROM exercise for flexion/extension limitations. Continue with patella mobilizations and edema management and scar mobilization when healed fully. Continue with balance activities.  Educate on self patella mobilization.    PT Home Exercise Plan  Evaluation: heel slides, assisted knee flexion stretch seated, hamstring stretch wtih rope; 4/25: SLS and tandem stance    Consulted and Agree with Plan of Care  Patient       Patient will benefit from skilled therapeutic intervention in order to improve the following deficits and impairments:  Abnormal gait, Decreased balance, Decreased endurance, Decreased mobility, Difficulty walking, Hypomobility, Impaired sensation, Decreased range of motion, Decreased scar mobility, Increased edema, Improper body mechanics, Decreased activity tolerance, Decreased strength, Impaired flexibility, Pain  Visit Diagnosis: Stiffness of left knee, not elsewhere classified  Other abnormalities of gait and mobility  Unsteadiness on feet  Muscle weakness (generalized)     Problem List Patient Active Problem List   Diagnosis Date Noted  . S/P total knee replacement, 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 02/07/2018  . Primary osteoarthritis of left knee 02/01/2018  . Postural hypotension 07/23/2016  . Dysphagia 07/23/2016  . Obstructive sleep apnea 07/21/2016  . Hyperglycemia 07/21/2016  . Anemia, unspecified 07/21/2016  . Spinal stenosis of lumbar region 07/15/2016  . Bladder tumor 07/27/2015  . HNP (herniated nucleus pulposus), cervical 06/05/2013    Class: Diagnosis of  . CNS disorder  04/04/2013  . Muscle spasms of neck 04/04/2013  . 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 OA (osteoarthritis) of  knee 02/22/2013  . Iliotibial band syndrome of left side 11/22/2012  . Localized, primary osteoarthritis of the ankle and foot 10/11/2012  . Difficulty in walking(719.7) 08/17/2012  . Peroneal tendinitis 08/16/2012  . Precordial pain 02/09/2012  . Essential hypertension, benign 02/09/2012  . Mixed hyperlipidemia 02/09/2012  . Dysthymia 02/04/2012  . Abnormality of gait 12/23/2011  . Lateral epicondylitis/tennis elbow 05/06/2011  . TENOSYNOVITIS OF FOOT AND ANKLE 10/22/2010    Kipp Brood, PT, DPT Physical Therapist with [facility]  05/03/2018 9:02 AM    [place] [address] [place], [place], [address] Phone: [phone]   Fax:  [phone]  Name: Zachary [name] MRN: [id number] Date of Birth: 05-09-1946

## 2018-05-04 DIAGNOSIS — E119 Type 2 diabetes mellitus without complications: Secondary | ICD-10-CM | POA: Diagnosis not present

## 2018-05-04 DIAGNOSIS — I10 Essential (primary) hypertension: Secondary | ICD-10-CM | POA: Diagnosis not present

## 2018-05-04 DIAGNOSIS — F339 Major depressive disorder, recurrent, unspecified: Secondary | ICD-10-CM | POA: Diagnosis not present

## 2018-05-04 DIAGNOSIS — R000 Tachycardia, unspecified: Secondary | ICD-10-CM | POA: Diagnosis not present

## 2018-05-04 DIAGNOSIS — Z6832 Body mass index (BMI) 32.0-32.9, adult: Secondary | ICD-10-CM | POA: Diagnosis not present

## 2018-05-04 DIAGNOSIS — G20 Parkinson's disease: Secondary | ICD-10-CM | POA: Diagnosis not present

## 2018-05-04 DIAGNOSIS — F419 Anxiety disorder, unspecified: Secondary | ICD-10-CM | POA: Diagnosis not present

## 2018-05-04 DIAGNOSIS — E782 Mixed hyperlipidemia: Secondary | ICD-10-CM | POA: Diagnosis not present

## 2018-05-05 ENCOUNTER — Ambulatory Visit (HOSPITAL_COMMUNITY): Payer: PPO

## 2018-05-05 ENCOUNTER — Other Ambulatory Visit: Payer: Self-pay

## 2018-05-05 ENCOUNTER — Encounter (HOSPITAL_COMMUNITY): Payer: Self-pay

## 2018-05-05 DIAGNOSIS — R2681 Unsteadiness on feet: Secondary | ICD-10-CM

## 2018-05-05 DIAGNOSIS — R2689 Other abnormalities of gait and mobility: Secondary | ICD-10-CM

## 2018-05-05 DIAGNOSIS — M25662 Stiffness of left knee, not elsewhere classified: Secondary | ICD-10-CM | POA: Diagnosis not present

## 2018-05-05 NOTE — Therapy (Signed)
[place], [place], [address] Phone: [phone]   Fax:  [phone]  Physical Therapy Treatment/Discharge Summary  Patient Details  Name: QUINT CHESTNUT MRN: [id number] Date of Birth: 1946/09/29 Referring Provider: Arther Abbott MD   Encounter Date: 05/05/2018   Progress Note Reporting Period 04/14/18 to 05/05/2018  See note below for Objective Data and Assessment of Progress/Goals.   PHYSICAL THERAPY DISCHARGE SUMMARY  Visits from Start of Care: 7  Current functional level related to goals / functional outcomes: Re-assessment performed today and patient has met/partially met all goals. His Lt knee ROM remains limited from 2-113 degrees but has improved significantly since start of therapy. He has demonstrated consistent safe gait velocity and has reported returning to walking 1-2 miles 3x/week or more for regular exercise. 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 He has been educated on improvements and benefits of continuing to walk with silver sneakers and was educated on benefits of boxing and dancing for individuals with parkinson's. He was provided additional stretches to continue with HEP and handout on "rockstaedy" boxing group locally as resource for strengthening and balance.    Remaining deficits: See below details.   Education / Equipment: Educated on progress towards goals and readiness for discharge.   Plan: Patient agrees to discharge.  Patient goals were met. Patient is being discharged due to meeting the stated rehab goals.  ?????        PT End of Session - 05/05/18 0844    Visit Number  7    Number of Visits  7    Date for PT Re-Evaluation  05/05/18    Authorization Type  [employer] (no limit, no auth required)    Authorization Time Period  04/14/18-05/05/18    Authorization - Visit Number  7    Authorization - Number of Visits  10    PT Start Time   [phone] patient late    PT Stop Time  0853 discharge    PT Time Calculation (min)  30 min    Equipment Utilized During Treatment  Gait belt    Activity Tolerance  Patient tolerated treatment well;No increased pain    Behavior During Therapy  WFL for tasks assessed/performed       Past Medical History:  Diagnosis Date  . Anxiety    takes Valium as needed  . Arthritis   . Bladder cancer ([place])    takes Rapaflo daily  . Blood dyscrasia    07/08/16: pt told he was a "free bleeder" after bleeding a lot when derm cut off mole on forehead. No excessive bleeding with minor wounds at home, no bleeding problems perioperatively with prior surgeries.  . Chronic back pain    spondylolisthesis  . Cluster headaches   . Depression    takes Lexapro daily  . Essential hypertension, benign    takes Diovan-HCT daily  . Hx of complications due to general anesthesia    Aborted surgery 07/15/2016 due to hypotension per pt  . Joint pain   . Obstructive sleep apnea on CPAP    uses CPAP @ night  . Pneumonia    "several times"--last time about 3-4 yrs ago  . Prediabetes     Past Surgical History:  Procedure Laterality Date  . ANTERIOR CERVICAL DECOMP/DISCECTOMY FUSION N/A 06/05/2013   Procedure:  C3-4 Anterior Cervical Discectomy and Fusion, Allograft, Plate;  Surgeon: [name], MD;  Location: [facility];  Service: Orthopedics;  Laterality: N/A;  C3-4 Anterior Cervical Discectomy and Fusion, Allograft, Plate  . Arthroscopic knee surgery    . BACK SURGERY      x 2  . BLADDER SURGERY     x 5 to remove tumor  . CARPAL TUNNEL RELEASE    . CATARACT EXTRACTION W/PHACO  12/19/2012   Procedure: CATARACT EXTRACTION PHACO AND INTRAOCULAR LENS PLACEMENT (IOC);  Surgeon: [name], MD;  [facility]: [facility];  Service: Ophthalmology;  Laterality: Left;  CDE: 26.80  . CATARACT EXTRACTION W/PHACO  01/09/2013   Procedure: CATARACT EXTRACTION PHACO AND INTRAOCULAR LENS PLACEMENT (IOC);  Surgeon: [name], MD;  [facility]: [facility];  Service: Ophthalmology;  Laterality: Right;  CDE:22.83  . CERVICAL FUSION  06/05/2013   C 3  C4  . CYSTOSCOPY    . REFRACTIVE SURGERY     Hx: of  . RETINAL DETACHMENT SURGERY    . TOTAL KNEE ARTHROPLASTY Left 02/01/2018   Procedure: TOTAL KNEE ARTHROPLASTY;  Surgeon: Carole Civil, MD;  [facility]: [facility];  Service: Orthopedics;  Laterality: Left;  . TRANSURETHRAL RESECTION OF BLADDER TUMOR Right 07/27/2015   Procedure: TRANSURETHRAL RESECTION OF BLADDER TUMOR (TURBT);  Surgeon: [name], MD;  [facility];  Service: Urology;  Laterality: Right;  . wisdom tooth extraction      There were no vitals filed for this visit.  Subjective Assessment - 05/05/18 0901    Subjective  Patient arrives reporting he feels well and that walked 2 miles ont eh track yseterday and jogged the last lap without difficulty. 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 He is performing his exercises daily. He also asks about what someone with parkinson's can do to keep up their strength and balance.     Pertinent History  Fall with incision dehisce on 03/26/18; Left TKA 02/01/18; Spinal fusion 2 years ago; HTN    Limitations  Standing;Walking    Diagnostic tests  X-ray 12/15/17: "severe arthritis of the left knee with varus alignment"; [place] 12/23/17: "Segmental exam demonstrates no evidence of significant arterial"; X-ray 02/01/18: "Satisfactory total knee replacement"    Patient Stated Goals  To loosen his Lt knee up and gain back the ROM he has when last in therapy    Currently in Pain?  No/denies         [facility] PT Assessment - 05/05/18 0001      Assessment   Medical Diagnosis  S/P fall with dehising of incision for Lt TKA    Referring Provider  Arther Abbott MD    Onset Date/Surgical Date  03/26/18      AROM   Left Knee Extension  2    Left Knee Flexion  113      Strength   Right Hip  Flexion  5/5    Right Hip Extension  5/5    Right Hip ABduction  5/5    Left Hip Flexion  5/5    Left Hip Extension  5/5    Left Hip ABduction  5/5    Right Knee Flexion  5/5    Right Knee Extension  5/5    Left Knee Flexion  5/5    Left Knee Extension  5/5    Right Ankle Dorsiflexion  5/5    Left Ankle Dorsiflexion  5/5      Transfers   Five time sit  to stand comments   -- was 9.65 secdonds without UE on 04/14/18      Ambulation/Gait   Ambulation/Gait  Yes    Ambulation/Gait Assistance  7: Independent    Ambulation Distance (Feet)  460 Feet 2MWT    Assistive device  None    Gait Pattern  Within Functional Limits    Ambulation Surface  Level    Gait velocity  1.15 m/s      Static Standing Balance   Static Standing Balance -  Activities   Single Leg Stance - Left Leg    Static Standing - Comment/# of Minutes  Rt LE = 14 seconds; Lt LE = 8 seconds         [facility] Adult PT Treatment/Exercise - 05/05/18 0001      Knee/Hip Exercises: Stretches   Active Hamstring Stretch  3 reps;30 seconds;Limitations    Active Hamstring Stretch Limitations  with rope      Knee/Hip Exercises: Prone   Contract/Relax to Increase Flexion  3x 5-8 seconds contract/30 secodns relax for quad stretch         PT Education - 05/05/18 0843    Education provided  Yes    Education Details  Educated on progress towards goals and readiness for discharge.     Person(s) Educated  Patient    Methods  Explanation;Handout    Comprehension  Verbalized understanding;Returned demonstration       PT Short Term Goals - 05/05/18 0854      PT SHORT TERM GOAL #1   Title  Patient will demonstrate understanding and report regular compliance with HEP.    Time  2    Period  Weeks    Status  Achieved        PT Long Term Goals - 05/05/18 7564      PT LONG TERM GOAL #1   Title  Patient will improve ROM for left knee extension/flexion to 2-115 degrees to improve ease of gait, sit to stands, squatting, and  other functional mobility.    Baseline  05/05/18 - 2-112 (reviously has achieved 114 degress for flexion    Time  3    Period  Weeks    Status  Partially Met      PT LONG TERM GOAL #2   Title  Patient will return to walking 1-2 miles at least 3x/week for regular exercise aroudn teh school track to return to [facility] and improve overall health and activity level.     Baseline  05/05/18 - patient is walking 5x/week at football practice and is walkign 1-2 miles on the track for exercise 3 times per week (he reports he jogged 1 lap around the track yesterday without difficulty)    Time  3    Period  Weeks    Status  Achieved      PT LONG TERM GOAL #3   Title  Patient will improve SLS balance to 15 secodns for Bil LE to improve balance during dynamic gait and stair ambulation and decrease fall risk.    Baseline  05/05/18- Rt LE = 15 sec; Lt LE = 8 seconds    Time  3    Period  Weeks    Status  Partially Met         Plan - 05/05/18 0844    Clinical Impression Statement  Re-assessment performed today and patient has met/partially met all goals. His Lt knee ROM remains limited from 2-113 degrees but has improved significantly since start  of therapy. He has demonstrated consistent safe gait velocity and has reported returning to walking 1-2 miles 3x/week or more for regular exercise. 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 He has been educated on improvements and benefits of continuing to walk with silver sneakers and was educated on benefits of boxing and dancing for individuals with parkinson's. He was provided additional stretches to continue with HEP and handout on "rockstaedy" boxing group locally as resource for strengthening and balance.     Rehab Potential  Good    Clinical Impairments Affecting Rehab Potential  Positive: highly motivated; positive support system; Negative: comorbidities; financial restrictions     PT Frequency  2x / week    PT Duration  3 weeks     PT Treatment/Interventions  ADLs/Self Care Home Management;Cryotherapy;Gait training;Stair training;Functional mobility training;Therapeutic activities;Therapeutic exercise;Balance training;Neuromuscular re-education;Patient/family education;Manual techniques;Compression bandaging;Scar mobilization;Passive range of motion;Energy conservation;Aquatic Therapy    PT Next Visit Plan  Discharging this session.    PT Home Exercise Plan  Evaluation: heel slides, assisted knee flexion stretch seated, hamstring stretch wtih rope; 4/25: SLS and tandem stance    Consulted and Agree with Plan of Care  Patient       Patient will benefit from skilled therapeutic intervention in order to improve the following deficits and impairments:  Abnormal gait, Decreased balance, Decreased endurance, Decreased mobility, Difficulty walking, Hypomobility, Impaired sensation, Decreased range of motion, Decreased scar mobility, Increased edema, Improper body mechanics, Decreased activity tolerance, Decreased strength, Impaired flexibility, Pain  Visit Diagnosis: Stiffness of left knee, not elsewhere classified  Other abnormalities of gait and mobility  Unsteadiness on feet     Problem List Patient Active Problem List   Diagnosis Date Noted  . S/P total knee replacement, 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 02/07/2018  . Primary osteoarthritis of left knee 02/01/2018  . Postural hypotension 07/23/2016  . Dysphagia 07/23/2016  . Obstructive sleep apnea 07/21/2016  . Hyperglycemia 07/21/2016  . Anemia, unspecified 07/21/2016  . Spinal stenosis of lumbar region 07/15/2016  . Bladder tumor 07/27/2015  . HNP (herniated nucleus pulposus), cervical 06/05/2013    Class: Diagnosis of  . 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  . Muscle spasms of neck 04/04/2013  . 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 OA (osteoarthritis) of knee 02/22/2013  . Iliotibial band syndrome of left side 11/22/2012  . Localized, primary osteoarthritis of the  ankle and foot 10/11/2012  . Difficulty in walking(719.7) 08/17/2012  . Peroneal tendinitis 08/16/2012  . Precordial pain 02/09/2012  . Essential hypertension, benign 02/09/2012  . Mixed hyperlipidemia 02/09/2012  . Dysthymia 02/04/2012  . Abnormality of gait 12/23/2011  . Lateral epicondylitis/tennis elbow 05/06/2011  . TENOSYNOVITIS OF FOOT AND ANKLE 10/22/2010    Kipp Brood, PT, DPT Physical Therapist with [facility]  05/05/2018 9:58 AM    [place], [place], [address] Phone: [phone]   Fax:  [phone]  Name: KAIDIN BOEHLE MRN: [id number] Date of Birth: [birth year]

## 2018-05-09 ENCOUNTER — Encounter (HOSPITAL_COMMUNITY): Payer: Self-pay

## 2018-05-11 ENCOUNTER — Encounter: Payer: Self-pay | Admitting: Orthopedic Surgery

## 2018-05-11 ENCOUNTER — Ambulatory Visit (INDEPENDENT_AMBULATORY_CARE_PROVIDER_SITE_OTHER): Payer: PPO | Admitting: Orthopedic Surgery

## 2018-05-11 ENCOUNTER — Encounter (HOSPITAL_COMMUNITY): Payer: Self-pay

## 2018-05-11 ENCOUNTER — Ambulatory Visit: Payer: PPO | Admitting: Orthopedic Surgery

## 2018-05-11 VITALS — BP 141/82 | HR 85 | Ht 72.0 in | Wt 223.0 lb

## 2018-05-11 DIAGNOSIS — Z96652 Presence of left artificial knee joint: Secondary | ICD-10-CM

## 2018-05-11 NOTE — Progress Notes (Signed)
Progress Note   Patient ID: Zachary [name], male   DOB: September 04, 1946, 72 y.o.   MRN: [id number]  Chief Complaint  Patient presents with  . Routine Post Op    Left total knee replacement 02/01/18 feeling better has increased activity  . Wrist Injury    left 03/26/18 fall feels much better      Medical decision-making Encounter Diagnosis  Name Primary?  . S/P total knee replacement, 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 Yes      No orders of the defined types were placed in this encounter.    PLAN:  Wrist is feeling better  Knee looks great he is walking well no support still has a little flexion contracture but overall improved  40mos fu left tka   Chief Complaint  Patient presents with  . Routine Post Op    Left total knee replacement 02/01/18 feeling better has increased activity  . Wrist Injury    left 03/26/18 fall feels much better     Problem 1 status post left total knee 3 months ago Problem 2 fell open up to incision treated with irrigation debridement and suture in the ER treated with antibiotics Problem #3 wrist pain from the fall worked up with CT scan  Problem #1 patient's doing well with his left total knee walking without assistance pain control excellent Problem #2 incision looks clean dry and intact without erythremia Problem #3 no pain in the wrist at this time    Review of Systems  Constitutional: Negative for chills and fever.   Current Meds  Medication Sig  . acetaminophen (TYLENOL) 500 MG tablet Take 500-1,000 mg by mouth every 6 (six) hours as needed (for pain.).  Marland Kitchen aspirin 81 MG chewable tablet Chew 1 tablet (81 mg total) by mouth 2 (two) times daily.  . carbidopa-levodopa (SINEMET IR) 25-100 MG tablet Take 1 tablet by mouth 3 (three) times daily before meals.  . carboxymethylcellulose (REFRESH PLUS) 0.5 % SOLN Place 1 drop into both eyes 3 (three) times daily as needed (for dry eyes.).   Marland Kitchen diazepam (VALIUM) 5 MG tablet Take 1 tablet (5 mg total) by mouth 2 (two) times  daily as needed. for anxiety  . DULoxetine (CYMBALTA) 60 MG capsule TAKE 1 CAPSULE(60 MG) BY MOUTH DAILY  . fluticasone (FLONASE) 50 MCG/ACT nasal spray Place 1-2 sprays into both nostrils daily as needed (for allergies.).   Marland Kitchen ibuprofen (ADVIL,MOTRIN) 200 MG tablet Take 400 mg by mouth every 8 (eight) hours as needed (for pain.).  Marland Kitchen valsartan-hydrochlorothiazide (DIOVAN-HCT) 320-12.5 MG tablet Take 1 tablet by mouth daily.    Allergies  Allergen Reactions  . Sulfonamide Derivatives Itching, Rash and Other (See Comments)    welts     BP (!) 141/82   Pulse 85   Ht 6' (1.829 m)   Wt 223 lb (101.2 kg)   BMI 30.24 kg/m   Physical Exam  Constitutional: He is oriented to person, place, and time. He appears well-developed and well-nourished.  Vital signs have been reviewed and are stable. Gen. appearance the patient is well-developed and well-nourished with normal grooming and hygiene.   Musculoskeletal:       Legs: Neurological: He is alert and oriented to person, place, and time.  Skin: Skin is warm and dry. No erythema.  Psychiatric: He has a normal mood and affect.  Vitals reviewed.      Arther Abbott, MD 05/11/2018 9:21 AM

## 2018-05-26 ENCOUNTER — Ambulatory Visit (HOSPITAL_COMMUNITY): Payer: Self-pay | Admitting: Psychiatry

## 2018-06-01 ENCOUNTER — Ambulatory Visit (INDEPENDENT_AMBULATORY_CARE_PROVIDER_SITE_OTHER): Payer: PPO | Admitting: Psychiatry

## 2018-06-01 ENCOUNTER — Encounter (HOSPITAL_COMMUNITY): Payer: Self-pay | Admitting: Psychiatry

## 2018-06-01 VITALS — BP 165/83 | HR 79 | Ht 73.0 in | Wt 229.0 lb

## 2018-06-01 DIAGNOSIS — F331 Major depressive disorder, recurrent, moderate: Secondary | ICD-10-CM

## 2018-06-01 MED ORDER — BUSPIRONE HCL 5 MG PO TABS: 5.0000 mg | ORAL_TABLET | Freq: Three times a day (TID) | ORAL | 2 refills | Status: DC

## 2018-06-01 MED ORDER — DULOXETINE HCL 60 MG PO CPEP: ORAL_CAPSULE | ORAL | 3 refills | Status: DC

## 2018-06-01 NOTE — Progress Notes (Signed)
BH MD/PA/NP OP Progress Note  06/01/2018 11:20 AM JAMARIUS SAHA  MRN:  [id number]  Chief Complaint:  Chief Complaint    Depression; Anxiety; Follow-up     HPI: This patient is a 72 year-old married white male who lives with his wife in [place] they have no children. 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  The patient states he got depressed several years ago after his diagnosed with stage IV bladder cancer. He was seeing a psychologist here to deal with this and last year was placed on Lexapro. His cancer is gone now after 5 surgeries.   The patient returns after 6 months.  Last February he had left total knee replacement.  Unfortunately he fell a few weeks later and reopen the wound and had to be resewn.  Set him back about 8 weeks but he is now recovered and ambulating well.  His wife does not like him being on Valium.  He was only taking 5 mg a day but now is cut it back to only taking it once or twice a week.  He states that he is quite anxious without it but he just tries to stay very busy.  He is sleeping fairly well.  I suggested we try BuSpar instead since it is a nonaddictive medication.  He does feel like the Cymbalta is helping his anxiety Visit Diagnosis:    ICD-10-CM   1. Major depressive disorder, recurrent episode, moderate (HCC) F33.1     Past Psychiatric History: Outpatient treatment for depression and anxiety for the last several years secondary to medical issues  Past Medical History:  Past Medical History:  Diagnosis Date  . Anxiety    takes Valium as needed  . Arthritis   . Bladder cancer ([place])    takes Rapaflo daily  . Blood dyscrasia    07/08/16: pt told he was a "free bleeder" after bleeding a lot when derm cut off mole on forehead. No excessive bleeding with minor wounds at home, no bleeding problems perioperatively with prior  surgeries.  . Chronic back pain    spondylolisthesis  . Cluster headaches   . Depression    takes Lexapro daily  . Essential hypertension, benign    takes Diovan-HCT daily  . Hx of complications due to general anesthesia    Aborted surgery 07/15/2016 due to hypotension per pt  . Joint pain   . Obstructive sleep apnea on CPAP    uses CPAP @ night  . Pneumonia    "several times"--last time about 3-4 yrs ago  . Prediabetes     Past Surgical History:  Procedure Laterality Date  . ANTERIOR CERVICAL DECOMP/DISCECTOMY FUSION N/A 06/05/2013   Procedure:  C3-4 Anterior Cervical Discectomy and Fusion, Allograft, Plate;  Surgeon: [name], MD;  Location: [facility];  Service: Orthopedics;  Laterality: N/A;  C3-4 Anterior Cervical Discectomy and Fusion, Allograft, Plate  . Arthroscopic knee surgery    . BACK SURGERY      x 2  . BLADDER SURGERY     x 5 to remove tumor  . CARPAL TUNNEL RELEASE    . CATARACT EXTRACTION W/PHACO  12/19/2012   Procedure: CATARACT EXTRACTION PHACO AND INTRAOCULAR LENS PLACEMENT (IOC);  Surgeon: [name], MD;  Location: [facility];  Service: Ophthalmology;  Laterality: Left;  CDE: 26.80  . CATARACT EXTRACTION W/PHACO  01/09/2013   Procedure: CATARACT EXTRACTION PHACO AND INTRAOCULAR LENS PLACEMENT (IOC);  Surgeon: [name], MD;  Location: [facility];  Service: Ophthalmology;  Laterality: Right;  CDE:22.83  . CERVICAL FUSION  06/05/2013   C 3  C4  . CYSTOSCOPY    . REFRACTIVE SURGERY     Hx: of  . RETINAL DETACHMENT SURGERY    . TOTAL KNEE ARTHROPLASTY Left 02/01/2018   Procedure: TOTAL KNEE ARTHROPLASTY;  Surgeon: Carole Civil, MD;  Location: [facility];  Service: Orthopedics;  Laterality: Left;  . TRANSURETHRAL RESECTION OF BLADDER TUMOR Right 07/27/2015   Procedure: TRANSURETHRAL RESECTION OF BLADDER TUMOR (TURBT);  Surgeon: [name], MD;  Location: [facility];  Service: Urology;  Laterality: Right;  . wisdom tooth extraction      Family Psychiatric History:  See below  Family History:  Family History  Problem Relation Age of Onset  . Hypertension Father   . Cancer Father        Prostate  . Depression Mother   . Hypertension Mother   . Alcohol abuse Mother   . COPD Mother        passed 07-08-17  . Hypertension Sister   . Anxiety disorder Sister   . Alcohol abuse Maternal Grandfather   . ADD / ADHD Neg Hx   . Bipolar disorder Neg Hx   . Dementia Neg Hx   . Drug abuse Neg Hx   . OCD Neg Hx   . Paranoid behavior Neg Hx   . Schizophrenia Neg Hx   . Seizures Neg Hx   . Sexual abuse Neg Hx   . Physical abuse Neg Hx     Social History:  Social History   Socioeconomic History  . Marital status: Married    Spouse name: [name]   . Number of children: 0  . Years of education: 47  . Highest education level: Not on file  Occupational History  . Occupation: Retired    Fish farm manager: UNEMPLOYED  Social Needs  . Financial resource strain: Not on file  . Food insecurity:    Worry: Not on file    Inability: Not on file  . Transportation needs:    Medical: Not on file    Non-medical: Not on file  Tobacco Use  . Smoking status: Former Smoker    Last attempt to quit: 12/30/1976    Years since quitting: 41.4  . Smokeless tobacco: Never Used  . Tobacco comment: 1985  Substance and Sexual Activity  . Alcohol use: No    Comment: quit 1990  . Drug use: No  . Sexual activity: Yes  Lifestyle  . Physical activity:    Days per week: Not on file    Minutes per session: Not on file  . Stress: Not on file  Relationships  . Social connections:    Talks on phone: Not on file    Gets together: Not on file    Attends religious service: Not on file    Active member of club or organization: Not on file    Attends meetings of clubs or organizations: Not on file    Relationship status: Not on file  Other Topics Concern  . Not on file  Social History Narrative   Lives with wife    caffeine- sodas    Allergies:  Allergies  Allergen Reactions   . Sulfonamide Derivatives Itching, Rash and Other (See Comments)    welts    Metabolic Disorder Labs: Lab Results  Component Value  Date   HGBA1C 5.8 (H) 07/22/2016   MPG 120 07/22/2016   No results found for: PROLACTIN No results found for: CHOL, TRIG, HDL, CHOLHDL, VLDL, LDLCALC No results found for: TSH  Therapeutic Level Labs: No results found for: LITHIUM No results found for: VALPROATE No components found for:  CBMZ  Current Medications: Current Outpatient Medications  Medication Sig Dispense Refill  . acetaminophen (TYLENOL) 500 MG tablet Take 500-1,000 mg by mouth every 6 (six) hours as needed (for pain.).    Marland Kitchen aspirin 81 MG chewable tablet Chew 1 tablet (81 mg total) by mouth 2 (two) times daily. 30 tablet 0  . busPIRone (BUSPAR) 5 MG tablet Take 1 tablet (5 mg total) by mouth 3 (three) times daily. 90 tablet 2  . carbidopa-levodopa (SINEMET IR) 25-100 MG tablet Take 1 tablet by mouth 3 (three) times daily before meals. 270 tablet 4  . carboxymethylcellulose (REFRESH PLUS) 0.5 % SOLN Place 1 drop into both eyes 3 (three) times daily as needed (for dry eyes.).     Marland Kitchen diazepam (VALIUM) 5 MG tablet Take 1 tablet (5 mg total) by mouth 2 (two) times daily as needed. for anxiety 28 tablet 0  . DULoxetine (CYMBALTA) 60 MG capsule TAKE 1 CAPSULE(60 MG) BY MOUTH DAILY 90 capsule 3  . fluticasone (FLONASE) 50 MCG/ACT nasal spray Place 1-2 sprays into both nostrils daily as needed (for allergies.).     Marland Kitchen ibuprofen (ADVIL,MOTRIN) 200 MG tablet Take 400 mg by mouth every 8 (eight) hours as needed (for pain.).    Marland Kitchen valsartan-hydrochlorothiazide (DIOVAN-HCT) 320-12.5 MG tablet Take 1 tablet by mouth daily.     No current facility-administered medications for this visit.      Musculoskeletal: Strength & Muscle Tone: within normal limits Gait & Station: normal Patient leans: N/A  Psychiatric Specialty Exam: Review of Systems  Psychiatric/Behavioral: The patient is nervous/anxious.      Blood pressure (!) 165/83, pulse 79, height 6\' 1"  (1.854 m), weight 229 lb (103.9 kg), SpO2 98 %.Body mass index is 30.21 kg/m.  General Appearance: Casual, Neat and Well Groomed  Eye Contact:  Good  Speech:  Clear and Coherent  Volume:  Normal  Mood:  Anxious  Affect:  Congruent  Thought Process:  Goal Directed  Orientation:  Full (Time, Place, and Person)  Thought Content: Logical   Suicidal Thoughts:  No  Homicidal Thoughts:  No  Memory:  Immediate;   Good Recent;   Good Remote;   Good  Judgement:  Good  Insight:  Good  Psychomotor Activity:  Normal  Concentration:  Concentration: Good and Attention Span: Good  Recall:  Good  Fund of Knowledge: Good  Language: Good  Akathisia:  No  Handed:  Right  AIMS (if indicated): not done  Assets:  Communication Skills Desire for Improvement Resilience Social Support Talents/Skills  ADL's:  Intact  Cognition: WNL  Sleep:  Good   Screenings: Mini-Mental     Office Visit from 12/22/2017 in [facility] Office Visit from 09/21/2017 in [facility] Office Visit from 04/01/2017 in [facility] Office Visit from 01/15/2017 in [facility]  Total Score (max 30 points )  30  28  27  27        Assessment and Plan: This patient is a 72 year old male with a history of depression and anxiety.  His depression has been improved on Cymbalta 30 mg daily.  His wife does not like the idea of him taking Valium.  We  will therefore change to BuSpar 5 mg 3 times daily.  He will return to see me in 6 weeks   [name], MD 06/01/2018, 11:20 AM

## 2018-06-21 ENCOUNTER — Ambulatory Visit: Payer: Medicare HMO | Admitting: Diagnostic Neuroimaging

## 2018-07-08 DIAGNOSIS — R42 Dizziness and giddiness: Secondary | ICD-10-CM | POA: Diagnosis not present

## 2018-07-08 DIAGNOSIS — G629 Polyneuropathy, unspecified: Secondary | ICD-10-CM | POA: Diagnosis not present

## 2018-07-08 DIAGNOSIS — I10 Essential (primary) hypertension: Secondary | ICD-10-CM | POA: Diagnosis not present

## 2018-07-08 DIAGNOSIS — E782 Mixed hyperlipidemia: Secondary | ICD-10-CM | POA: Diagnosis not present

## 2018-07-08 DIAGNOSIS — E119 Type 2 diabetes mellitus without complications: Secondary | ICD-10-CM | POA: Diagnosis not present

## 2018-07-08 DIAGNOSIS — R000 Tachycardia, unspecified: Secondary | ICD-10-CM | POA: Diagnosis not present

## 2018-07-08 DIAGNOSIS — F419 Anxiety disorder, unspecified: Secondary | ICD-10-CM | POA: Diagnosis not present

## 2018-07-08 DIAGNOSIS — E1165 Type 2 diabetes mellitus with hyperglycemia: Secondary | ICD-10-CM | POA: Diagnosis not present

## 2018-07-08 DIAGNOSIS — F339 Major depressive disorder, recurrent, unspecified: Secondary | ICD-10-CM | POA: Diagnosis not present

## 2018-07-08 DIAGNOSIS — G3184 Mild cognitive impairment, so stated: Secondary | ICD-10-CM | POA: Diagnosis not present

## 2018-07-08 DIAGNOSIS — G20 Parkinson's disease: Secondary | ICD-10-CM | POA: Diagnosis not present

## 2018-07-11 ENCOUNTER — Other Ambulatory Visit (HOSPITAL_COMMUNITY): Payer: Self-pay | Admitting: Psychiatry

## 2018-07-11 ENCOUNTER — Telehealth (HOSPITAL_COMMUNITY): Payer: Self-pay

## 2018-07-11 MED ORDER — DIAZEPAM 5 MG PO TABS: 5.0000 mg | ORAL_TABLET | Freq: Two times a day (BID) | ORAL | 0 refills | Status: DC | PRN

## 2018-07-11 NOTE — Telephone Encounter (Signed)
Valium sent in -28 tablets only

## 2018-07-11 NOTE — Telephone Encounter (Signed)
Amy from Dr. Josue Hector office called and said that the patient needs a refill on Diazepam 5mg  tabs, and the patient informed Dr. [name] office that he isn't taking the Buspirone 5mg , he said that he made him feel funny. Next follow up visit is scheduled for 07-13-18. Please advise

## 2018-07-12 NOTE — Telephone Encounter (Signed)
Called and spoke with Amy at Dr. [name] office to inform her that the medication had been sent to the pharmacy

## 2018-07-13 ENCOUNTER — Ambulatory Visit (HOSPITAL_COMMUNITY): Payer: Self-pay | Admitting: Psychiatry

## 2018-07-14 ENCOUNTER — Ambulatory Visit (INDEPENDENT_AMBULATORY_CARE_PROVIDER_SITE_OTHER): Payer: PPO | Admitting: Psychiatry

## 2018-07-14 ENCOUNTER — Encounter (HOSPITAL_COMMUNITY): Payer: Self-pay | Admitting: Psychiatry

## 2018-07-14 VITALS — BP 138/87 | HR 86 | Ht 73.0 in | Wt 224.2 lb

## 2018-07-14 DIAGNOSIS — F331 Major depressive disorder, recurrent, moderate: Secondary | ICD-10-CM

## 2018-07-14 DIAGNOSIS — Z560 Unemployment, unspecified: Secondary | ICD-10-CM

## 2018-07-14 DIAGNOSIS — M2550 Pain in unspecified joint: Secondary | ICD-10-CM

## 2018-07-14 DIAGNOSIS — Z818 Family history of other mental and behavioral disorders: Secondary | ICD-10-CM

## 2018-07-14 DIAGNOSIS — Z811 Family history of alcohol abuse and dependence: Secondary | ICD-10-CM | POA: Diagnosis not present

## 2018-07-14 DIAGNOSIS — Z87891 Personal history of nicotine dependence: Secondary | ICD-10-CM | POA: Diagnosis not present

## 2018-07-14 MED ORDER — DIAZEPAM 5 MG PO TABS: 5.0000 mg | ORAL_TABLET | Freq: Two times a day (BID) | ORAL | 2 refills | Status: DC | PRN

## 2018-07-14 MED ORDER — DULOXETINE HCL 60 MG PO CPEP: ORAL_CAPSULE | ORAL | 3 refills | Status: DC

## 2018-07-14 NOTE — Progress Notes (Signed)
BH MD/PA/NP OP Progress Note  07/14/2018 9:54 AM Zachary [name]  MRN:  [id number]  Chief Complaint:  Chief Complaint    Depression; Anxiety; Follow-up     HPI: This patient is a 72year-old married white male who lives with his wife in [place] they have no children. 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  The patient states he got depressed several years ago after his diagnosed with stage IV bladder cancer. He was seeing a psychologist here to deal with this and last year was placed on Lexapro. His cancer is gone now after 5 surgeries.  The patient returns after 6 weeks.  His wife did not like him being on Valium so we tried to switch him to BuSpar.  It made him feel very zoned out and strange so he stopped it.  His primary doctor agrees with me that the little bit of Valium he gets is helpful and that we should continue it.  He is only on 5 mg and only takes it sporadically when needed.  He is doing well with the Cymbalta and denies being depressed.  His total knee replacement from February is finally healed up.  He is staying active helping with the football team, washing his house working in his yard etc.  He looks fit and his mood is quite upbeat today. Visit Diagnosis:    ICD-10-CM   1. Major depressive disorder, recurrent episode, moderate (HCC) F33.1     Past Psychiatric History: Outpatient treatment for depression and anxiety for the last several years secondary to medical issues  Past Medical History:  Past Medical History:  Diagnosis Date  . Anxiety    takes Valium as needed  . Arthritis   . Bladder cancer ([place])    takes Rapaflo daily  . Blood dyscrasia    07/08/16: pt told he was a "free bleeder" after bleeding a lot when derm cut off mole on forehead. No excessive bleeding with minor wounds at home, no bleeding problems perioperatively  with prior surgeries.  . Chronic back pain    spondylolisthesis  . Cluster headaches   . Depression    takes Lexapro daily  . Essential hypertension, benign    takes Diovan-HCT daily  . Hx of complications due to general anesthesia    Aborted surgery 07/15/2016 due to hypotension per pt  . Joint pain   . Obstructive sleep apnea on CPAP    uses CPAP @ night  . Pneumonia    "several times"--last time about 3-4 yrs ago  . Prediabetes     Past Surgical History:  Procedure Laterality Date  . ANTERIOR CERVICAL DECOMP/DISCECTOMY FUSION N/A 06/05/2013   Procedure:  C3-4 Anterior Cervical Discectomy and Fusion, Allograft, Plate;  Surgeon: [name], MD;  Location: [facility];  Service: Orthopedics;  Laterality: N/A;  C3-4 Anterior Cervical Discectomy and Fusion, Allograft, Plate  . Arthroscopic knee surgery    . BACK SURGERY      x 2  . BLADDER SURGERY     x 5 to remove tumor  . CARPAL TUNNEL RELEASE    . CATARACT EXTRACTION W/PHACO  12/19/2012   Procedure: CATARACT EXTRACTION PHACO AND INTRAOCULAR LENS PLACEMENT (IOC);  Surgeon: [name], MD;  Location: [facility];  Service: Ophthalmology;  Laterality: Left;  CDE: 26.80  . CATARACT EXTRACTION W/PHACO  01/09/2013   Procedure:  CATARACT EXTRACTION PHACO AND INTRAOCULAR LENS PLACEMENT (IOC);  Surgeon: [name], MD;  [facility]: [facility];  Service: Ophthalmology;  Laterality: Right;  CDE:22.83  . CERVICAL FUSION  06/05/2013   C 3  C4  . CYSTOSCOPY    . REFRACTIVE SURGERY     Hx: of  . RETINAL DETACHMENT SURGERY    . TOTAL KNEE ARTHROPLASTY Left 02/01/2018   Procedure: TOTAL KNEE ARTHROPLASTY;  Surgeon: Carole Civil, MD;  Location: [facility];  Service: Orthopedics;  Laterality: Left;  . TRANSURETHRAL RESECTION OF BLADDER TUMOR Right 07/27/2015   Procedure: TRANSURETHRAL RESECTION OF BLADDER TUMOR (TURBT);  Surgeon: [name], MD;  Location: [facility];  Service: Urology;  Laterality: Right;  . wisdom tooth extraction      Family Psychiatric  History: See below  Family History:  Family History  Problem Relation Age of Onset  . Hypertension Father   . Cancer Father        Prostate  . Depression Mother   . Hypertension Mother   . Alcohol abuse Mother   . COPD Mother        passed 07-08-17  . Hypertension Sister   . Anxiety disorder Sister   . Alcohol abuse Maternal Grandfather   . ADD / ADHD Neg Hx   . Bipolar disorder Neg Hx   . Dementia Neg Hx   . Drug abuse Neg Hx   . OCD Neg Hx   . Paranoid behavior Neg Hx   . Schizophrenia Neg Hx   . Seizures Neg Hx   . Sexual abuse Neg Hx   . Physical abuse Neg Hx     Social History:  Social History   Socioeconomic History  . Marital status: Married    Spouse name: [name]   . Number of children: 0  . Years of education: 32  . Highest education level: Not on file  Occupational History  . Occupation: Retired    Fish farm manager: UNEMPLOYED  Social Needs  . Financial resource strain: Not on file  . Food insecurity:    Worry: Not on file    Inability: Not on file  . Transportation needs:    Medical: Not on file    Non-medical: Not on file  Tobacco Use  . Smoking status: Former Smoker    Last attempt to quit: 12/30/1976    Years since quitting: 41.5  . Smokeless tobacco: Never Used  . Tobacco comment: 1985  Substance and Sexual Activity  . Alcohol use: No    Comment: quit 1990  . Drug use: No  . Sexual activity: Yes  Lifestyle  . Physical activity:    Days per week: Not on file    Minutes per session: Not on file  . Stress: Not on file  Relationships  . Social connections:    Talks on phone: Not on file    Gets together: Not on file    Attends religious service: Not on file    Active member of club or organization: Not on file    Attends meetings of clubs or organizations: Not on file    Relationship status: Not on file  Other Topics Concern  . Not on file  Social History Narrative   Lives with wife    caffeine- sodas    Allergies:  Allergies  Allergen  Reactions  . Sulfonamide Derivatives Itching, Rash and Other (See Comments)    welts    Metabolic Disorder Labs: Lab Results  Component Value Date   HGBA1C  5.8 (H) 07/22/2016   MPG 120 07/22/2016   No results found for: PROLACTIN No results found for: CHOL, TRIG, HDL, CHOLHDL, VLDL, LDLCALC No results found for: TSH  Therapeutic Level Labs: No results found for: LITHIUM No results found for: VALPROATE No components found for:  CBMZ  Current Medications: Current Outpatient Medications  Medication Sig Dispense Refill  . acetaminophen (TYLENOL) 500 MG tablet Take 500-1,000 mg by mouth every 6 (six) hours as needed (for pain.).    Marland Kitchen aspirin 81 MG chewable tablet Chew 1 tablet (81 mg total) by mouth 2 (two) times daily. 30 tablet 0  . carbidopa-levodopa (SINEMET IR) 25-100 MG tablet Take 1 tablet by mouth 3 (three) times daily before meals. 270 tablet 4  . carboxymethylcellulose (REFRESH PLUS) 0.5 % SOLN Place 1 drop into both eyes 3 (three) times daily as needed (for dry eyes.).     Marland Kitchen diazepam (VALIUM) 5 MG tablet Take 1 tablet (5 mg total) by mouth 2 (two) times daily as needed. for anxiety 60 tablet 2  . DULoxetine (CYMBALTA) 60 MG capsule TAKE 1 CAPSULE(60 MG) BY MOUTH DAILY 90 capsule 3  . fluticasone (FLONASE) 50 MCG/ACT nasal spray Place 1-2 sprays into both nostrils daily as needed (for allergies.).     Marland Kitchen ibuprofen (ADVIL,MOTRIN) 200 MG tablet Take 400 mg by mouth every 8 (eight) hours as needed (for pain.).    Marland Kitchen valsartan-hydrochlorothiazide (DIOVAN-HCT) 320-12.5 MG tablet Take 1 tablet by mouth daily.     No current facility-administered medications for this visit.      Musculoskeletal: Strength & Muscle Tone: within normal limits Gait & Station: normal Patient leans: N/A  Psychiatric Specialty Exam: Review of Systems  Musculoskeletal: Positive for joint pain.  All other systems reviewed and are negative.   Blood pressure 138/87, pulse 86, height 6\' 1"  (1.854 m),  weight 224 lb 3.2 oz (101.7 kg), SpO2 96 %.Body mass index is 29.58 kg/m.  General Appearance: Casual, Neat and Well Groomed  Eye Contact:  Good  Speech:  Clear and Coherent  Volume:  Normal  Mood:  Euthymic  Affect:  Congruent  Thought Process:  Goal Directed  Orientation:  Full (Time, Place, and Person)  Thought Content: Rumination   Suicidal Thoughts:  No  Homicidal Thoughts:  No  Memory:  Immediate;   Good Recent;   Good Remote;   Good  Judgement:  Good  Insight:  Good  Psychomotor Activity:  Decreased  Concentration:  Concentration: Good and Attention Span: Good  Recall:  Good  Fund of Knowledge: Good  Language: Good  Akathisia:  No  Handed:  Right  AIMS (if indicated): not done  Assets:  Communication Skills Desire for Improvement Resilience Social Support Talents/Skills  ADL's:  Intact  Cognition: WNL  Sleep:  Good   Screenings: Mini-Mental     Office Visit from 12/22/2017 in [facility] Office Visit from 09/21/2017 in [facility] Office Visit from 04/01/2017 in [facility] Office Visit from 01/15/2017 in [facility]  Total Score (max 30 points )  30  28  27  27        Assessment and Plan: This patient is a 72 year old male with a history of depression and anxiety.  He is doing very well on his current regimen.  Despite his numerous medical problems he is working his way back to health and looks great today.  His Parkinson's disease is under good control with Sinemet.  He is trying to stay  active and busy.  He will continue Valium 5 mg twice a day only as needed for anxiety.  He will continue Cymbalta 60 mg daily for depression.  He will return to see me in 4 months.   [name], MD 07/14/2018, 9:54 AM

## 2018-07-15 IMAGING — DX DG WRIST COMPLETE 3+V*L*
4 series · 4 of 4 positions shown · non-contrast
Comparison: None.

CLINICAL DATA: 71-year-old male with fall and left wrist pain.

EXAM:
LEFT WRIST - COMPLETE 3+ VIEW

[wrist pa]
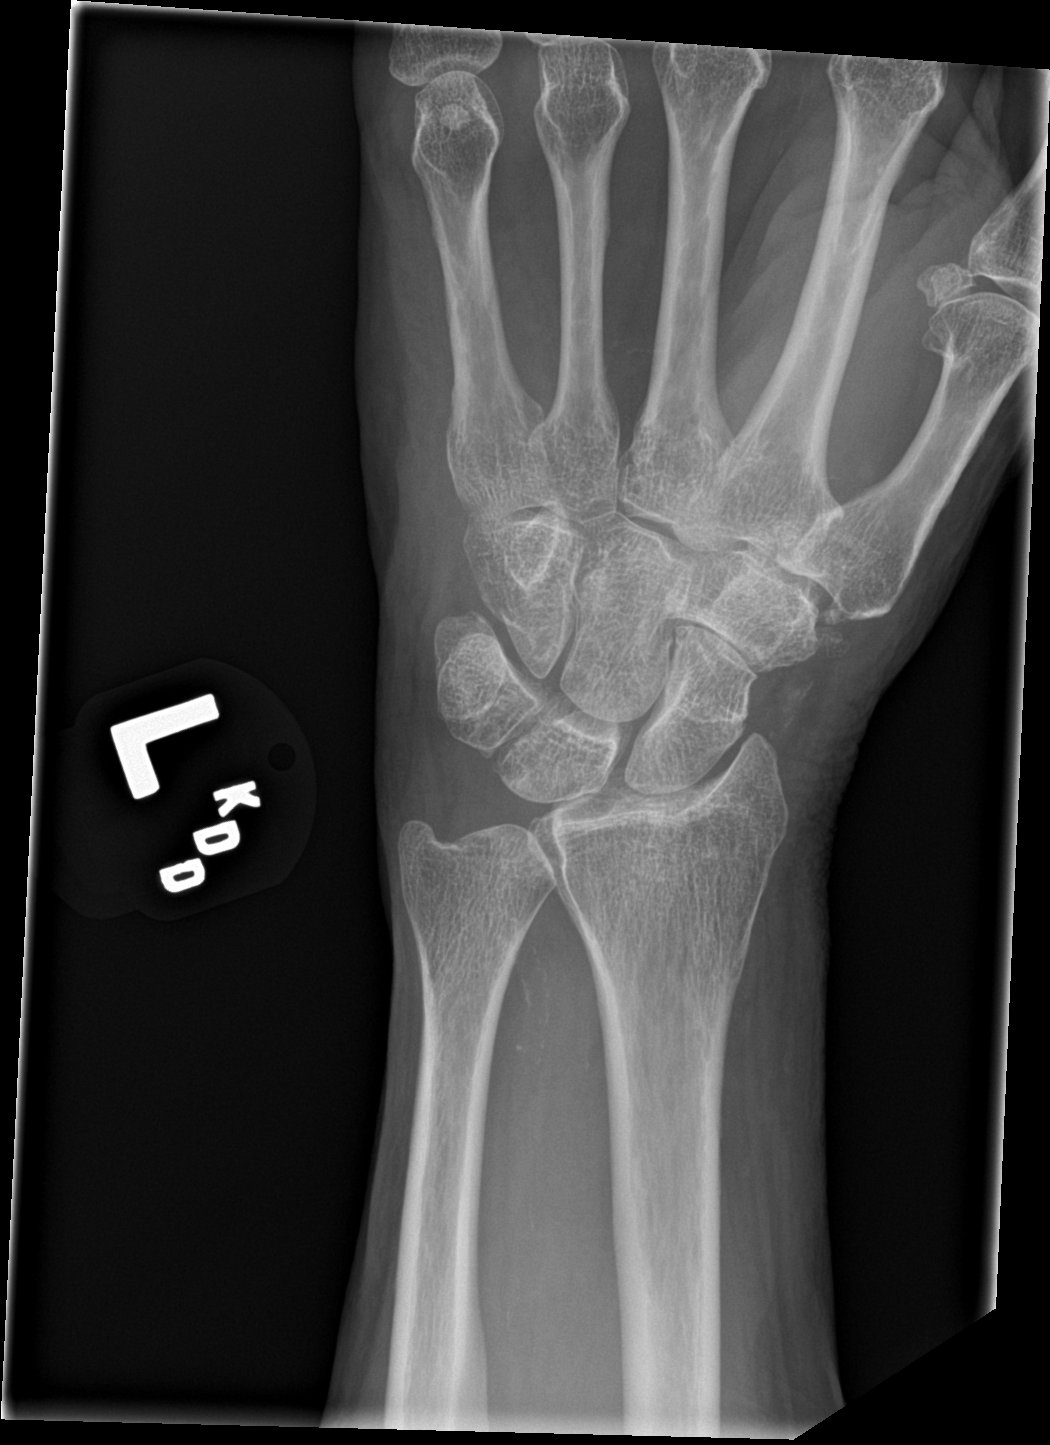

[wrist obl]
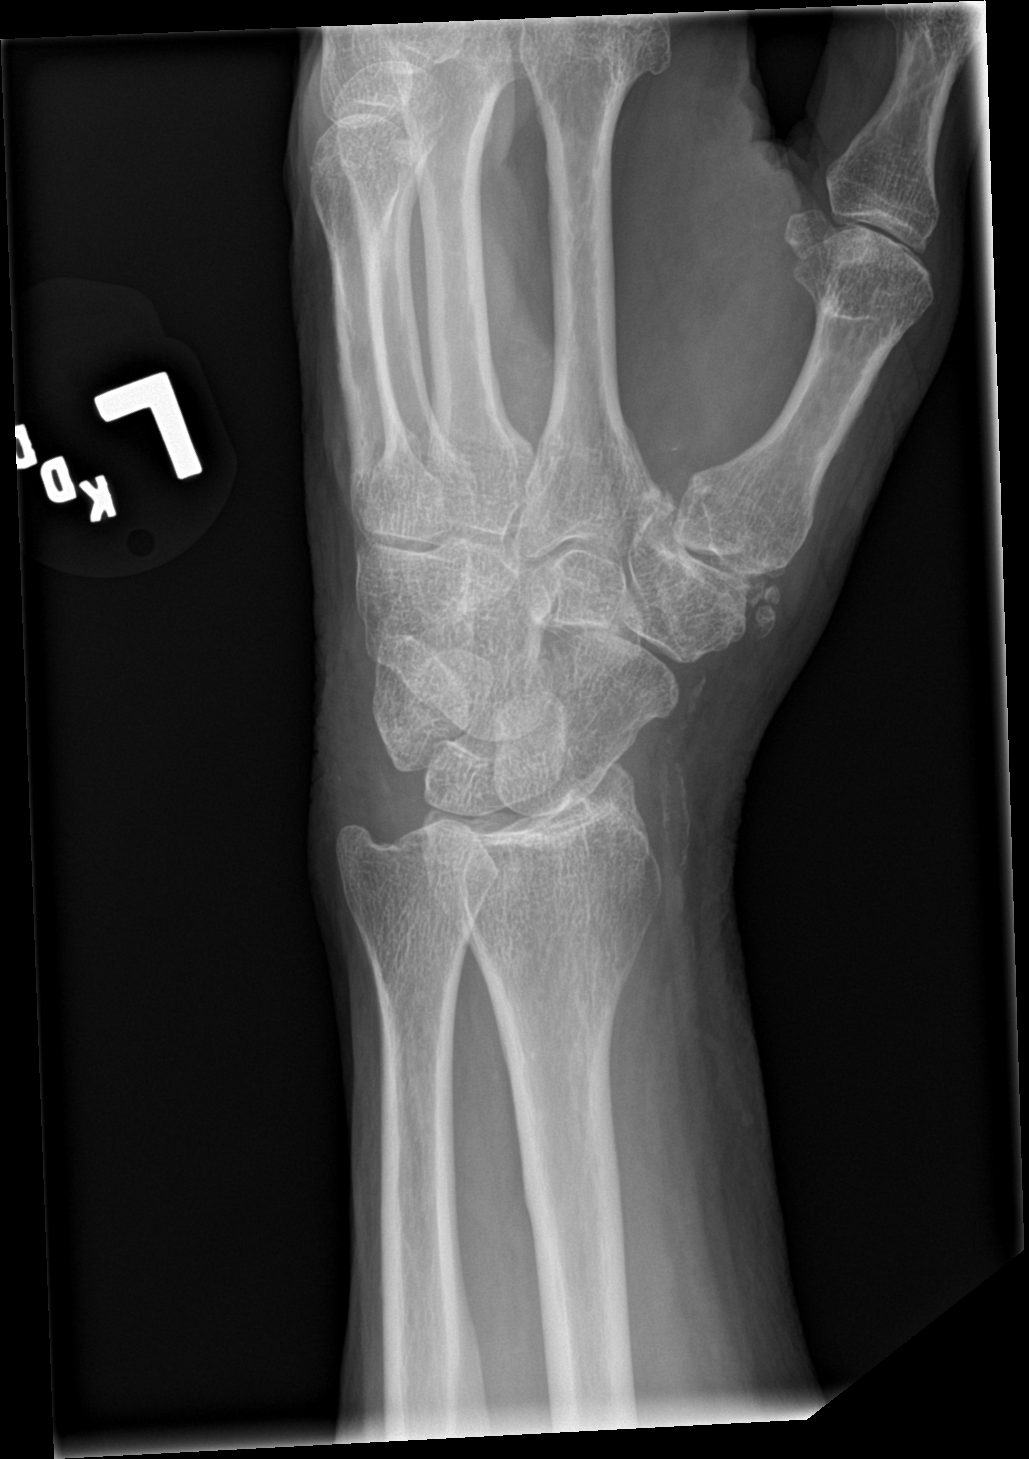

[wrist lat]
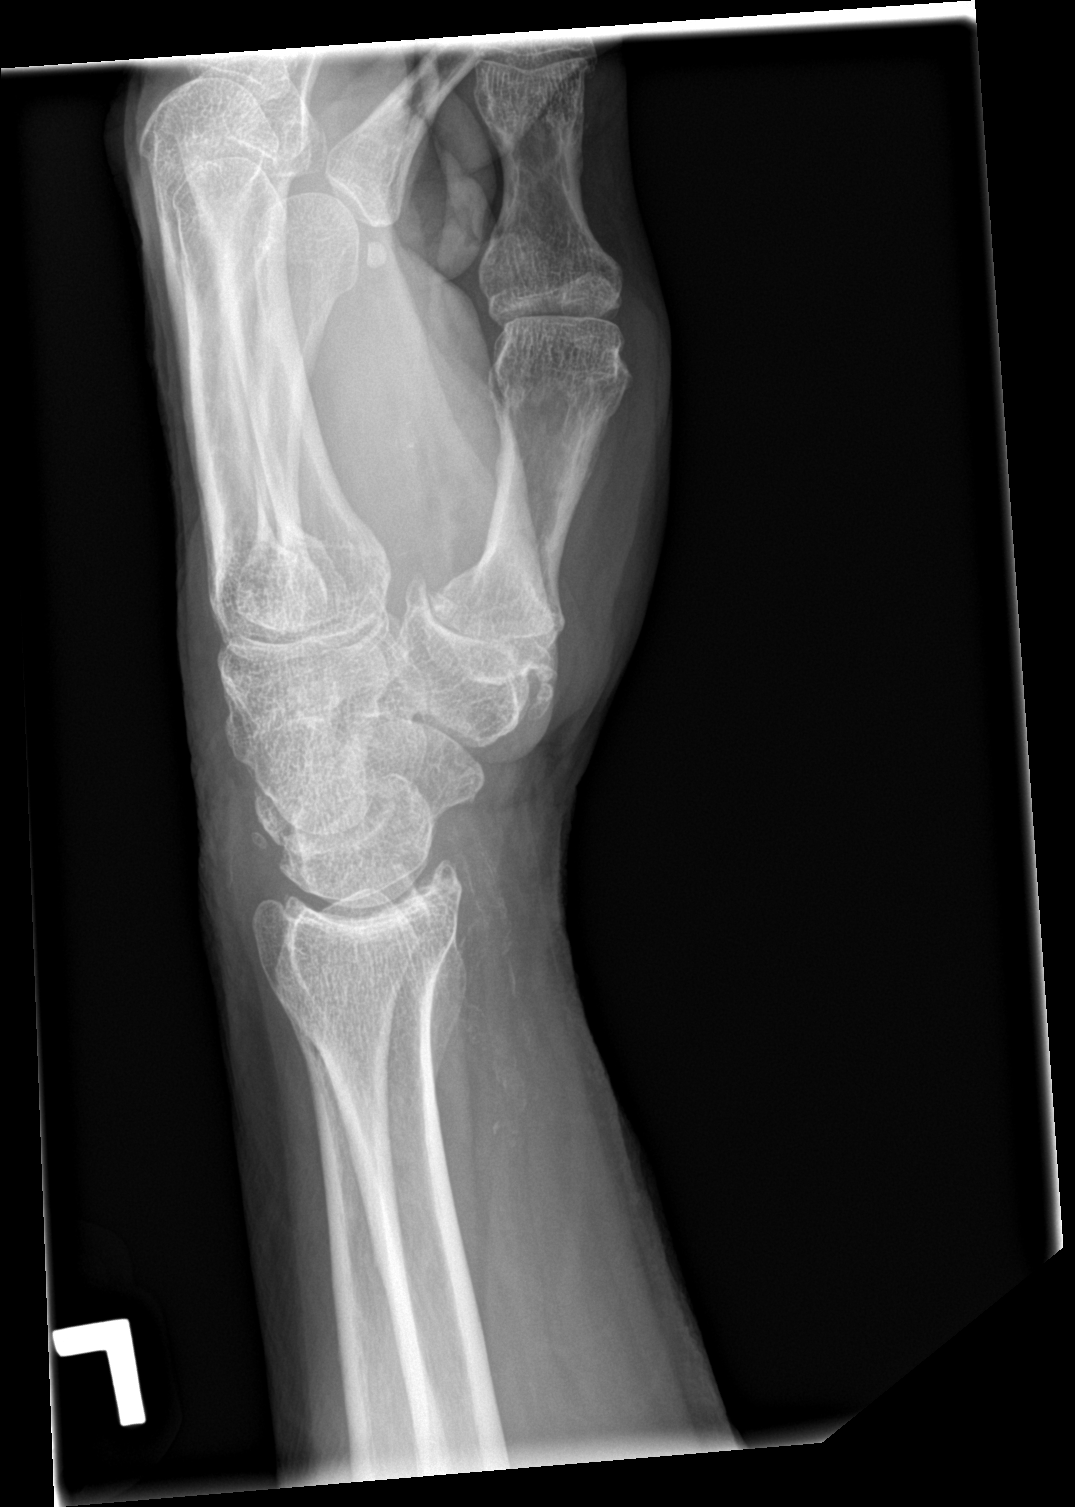

[wrist navicular]
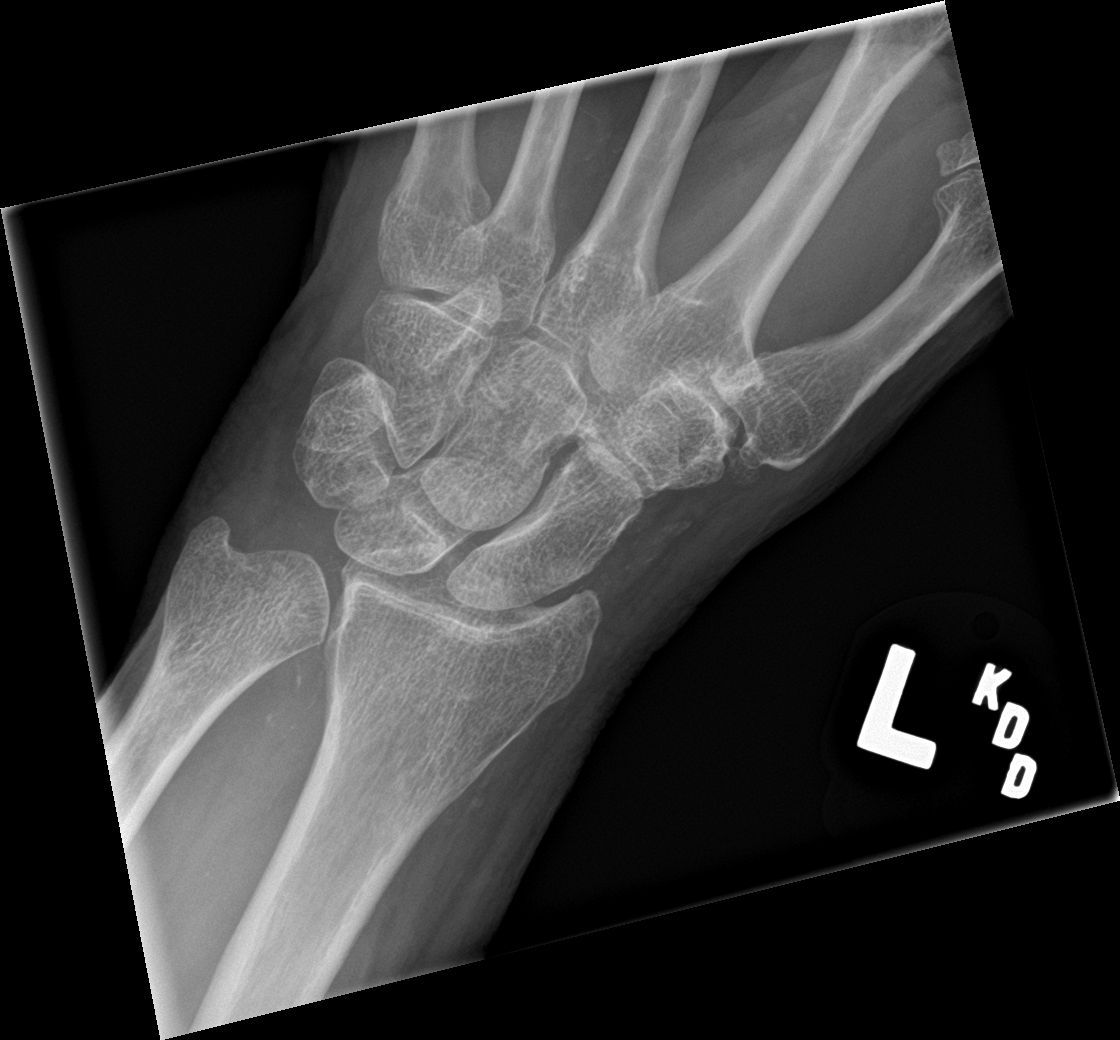

[4 of 4 positions shown; findings below may reference images not displayed]

FINDINGS: There is no acute fracture or dislocation. The bones are osteopenic.
There is arthritic changes of the base of the thumb. Mild soft
tissue swelling of the dorsum of the wrist.
IMPRESSION: No acute fracture or dislocation.

## 2018-08-01 DIAGNOSIS — G629 Polyneuropathy, unspecified: Secondary | ICD-10-CM | POA: Diagnosis not present

## 2018-08-01 DIAGNOSIS — D508 Other iron deficiency anemias: Secondary | ICD-10-CM | POA: Diagnosis not present

## 2018-08-01 DIAGNOSIS — R000 Tachycardia, unspecified: Secondary | ICD-10-CM | POA: Diagnosis not present

## 2018-08-01 DIAGNOSIS — E782 Mixed hyperlipidemia: Secondary | ICD-10-CM | POA: Diagnosis not present

## 2018-08-01 DIAGNOSIS — Z96651 Presence of right artificial knee joint: Secondary | ICD-10-CM | POA: Diagnosis not present

## 2018-08-01 DIAGNOSIS — I10 Essential (primary) hypertension: Secondary | ICD-10-CM | POA: Diagnosis not present

## 2018-08-01 DIAGNOSIS — Z9889 Other specified postprocedural states: Secondary | ICD-10-CM | POA: Diagnosis not present

## 2018-08-01 DIAGNOSIS — G3184 Mild cognitive impairment, so stated: Secondary | ICD-10-CM | POA: Diagnosis not present

## 2018-08-01 DIAGNOSIS — R946 Abnormal results of thyroid function studies: Secondary | ICD-10-CM | POA: Diagnosis not present

## 2018-08-01 DIAGNOSIS — F419 Anxiety disorder, unspecified: Secondary | ICD-10-CM | POA: Diagnosis not present

## 2018-08-01 DIAGNOSIS — F339 Major depressive disorder, recurrent, unspecified: Secondary | ICD-10-CM | POA: Diagnosis not present

## 2018-08-01 DIAGNOSIS — R7301 Impaired fasting glucose: Secondary | ICD-10-CM | POA: Diagnosis not present

## 2018-08-01 DIAGNOSIS — E1165 Type 2 diabetes mellitus with hyperglycemia: Secondary | ICD-10-CM | POA: Diagnosis not present

## 2018-08-03 DIAGNOSIS — E1165 Type 2 diabetes mellitus with hyperglycemia: Secondary | ICD-10-CM | POA: Diagnosis not present

## 2018-08-03 DIAGNOSIS — Z6832 Body mass index (BMI) 32.0-32.9, adult: Secondary | ICD-10-CM | POA: Diagnosis not present

## 2018-08-03 DIAGNOSIS — R000 Tachycardia, unspecified: Secondary | ICD-10-CM | POA: Diagnosis not present

## 2018-08-03 DIAGNOSIS — I10 Essential (primary) hypertension: Secondary | ICD-10-CM | POA: Diagnosis not present

## 2018-08-03 DIAGNOSIS — F419 Anxiety disorder, unspecified: Secondary | ICD-10-CM | POA: Diagnosis not present

## 2018-08-03 DIAGNOSIS — F339 Major depressive disorder, recurrent, unspecified: Secondary | ICD-10-CM | POA: Diagnosis not present

## 2018-08-03 DIAGNOSIS — E782 Mixed hyperlipidemia: Secondary | ICD-10-CM | POA: Diagnosis not present

## 2018-08-03 DIAGNOSIS — Z8551 Personal history of malignant neoplasm of bladder: Secondary | ICD-10-CM | POA: Diagnosis not present

## 2018-08-03 DIAGNOSIS — G20 Parkinson's disease: Secondary | ICD-10-CM | POA: Diagnosis not present

## 2018-08-10 ENCOUNTER — Ambulatory Visit: Payer: PPO | Admitting: Orthopedic Surgery

## 2018-08-17 ENCOUNTER — Encounter: Payer: Self-pay | Admitting: Orthopedic Surgery

## 2018-08-17 ENCOUNTER — Ambulatory Visit: Payer: PPO | Admitting: Orthopedic Surgery

## 2018-08-17 VITALS — BP 151/76 | HR 98 | Ht 73.0 in | Wt 224.0 lb

## 2018-08-17 DIAGNOSIS — Z96652 Presence of left artificial knee joint: Secondary | ICD-10-CM

## 2018-08-17 NOTE — Progress Notes (Signed)
Chief Complaint  Patient presents with  . Routine Post Op    left knee   02/01/18 DOS   He complains of some numbness on the lateral side of the incision mild pain over the iliotibial band  He is walking independently  His knee looks good he is got good flexion he regained almost all of his extension may be 3 degree deficit  He is walking well  Follow-up for his yearly x-ray

## 2018-09-05 ENCOUNTER — Emergency Department (HOSPITAL_COMMUNITY): Payer: PPO

## 2018-09-05 ENCOUNTER — Other Ambulatory Visit: Payer: Self-pay

## 2018-09-05 ENCOUNTER — Emergency Department (HOSPITAL_COMMUNITY)
Admission: EM | Admit: 2018-09-05 | Discharge: 2018-09-06 | Disposition: A | Discharge status: Home / Self Care | Payer: PPO | Attending: Emergency Medicine | Admitting: Emergency Medicine

## 2018-09-05 ENCOUNTER — Encounter (HOSPITAL_COMMUNITY): Payer: Self-pay | Admitting: Emergency Medicine

## 2018-09-05 DIAGNOSIS — Y999 Unspecified external cause status: Secondary | ICD-10-CM | POA: Diagnosis not present

## 2018-09-05 DIAGNOSIS — S81012A Laceration without foreign body, left knee, initial encounter: Secondary | ICD-10-CM | POA: Diagnosis not present

## 2018-09-05 DIAGNOSIS — Z96652 Presence of left artificial knee joint: Secondary | ICD-10-CM | POA: Diagnosis not present

## 2018-09-05 DIAGNOSIS — W19XXXA Unspecified fall, initial encounter: Secondary | ICD-10-CM | POA: Diagnosis not present

## 2018-09-05 DIAGNOSIS — Y939 Activity, unspecified: Secondary | ICD-10-CM | POA: Insufficient documentation

## 2018-09-05 DIAGNOSIS — S60211A Contusion of right wrist, initial encounter: Secondary | ICD-10-CM | POA: Diagnosis not present

## 2018-09-05 DIAGNOSIS — Z79899 Other long term (current) drug therapy: Secondary | ICD-10-CM | POA: Diagnosis not present

## 2018-09-05 DIAGNOSIS — Z8551 Personal history of malignant neoplasm of bladder: Secondary | ICD-10-CM | POA: Insufficient documentation

## 2018-09-05 DIAGNOSIS — S90112A Contusion of left great toe without damage to nail, initial encounter: Secondary | ICD-10-CM | POA: Diagnosis not present

## 2018-09-05 DIAGNOSIS — Y92321 Football field as the place of occurrence of the external cause: Secondary | ICD-10-CM | POA: Insufficient documentation

## 2018-09-05 DIAGNOSIS — S63501A Unspecified sprain of right wrist, initial encounter: Secondary | ICD-10-CM | POA: Insufficient documentation

## 2018-09-05 DIAGNOSIS — S99922A Unspecified injury of left foot, initial encounter: Secondary | ICD-10-CM | POA: Diagnosis not present

## 2018-09-05 DIAGNOSIS — S81022A Laceration with foreign body, left knee, initial encounter: Secondary | ICD-10-CM | POA: Diagnosis not present

## 2018-09-05 DIAGNOSIS — I10 Essential (primary) hypertension: Secondary | ICD-10-CM | POA: Insufficient documentation

## 2018-09-05 DIAGNOSIS — Z87891 Personal history of nicotine dependence: Secondary | ICD-10-CM | POA: Diagnosis not present

## 2018-09-05 DIAGNOSIS — S81011A Laceration without foreign body, right knee, initial encounter: Secondary | ICD-10-CM | POA: Diagnosis not present

## 2018-09-05 DIAGNOSIS — M7989 Other specified soft tissue disorders: Secondary | ICD-10-CM | POA: Diagnosis not present

## 2018-09-05 DIAGNOSIS — S6991XA Unspecified injury of right wrist, hand and finger(s), initial encounter: Secondary | ICD-10-CM | POA: Diagnosis not present

## 2018-09-05 MED ORDER — LIDOCAINE HCL (PF) 1 % IJ SOLN
INTRAMUSCULAR | Status: AC
  Filled 2018-09-05: qty 4

## 2018-09-05 MED ORDER — LIDOCAINE HCL (PF) 1 % IJ SOLN
INTRAMUSCULAR | Status: AC
  Administered 2018-09-05: 8 mL
  Filled 2018-09-05: qty 2

## 2018-09-05 MED ORDER — LIDOCAINE HCL (PF) 1 % IJ SOLN
INTRAMUSCULAR | Status: AC
  Filled 2018-09-05: qty 2

## 2018-09-05 NOTE — ED Notes (Signed)
ED Provider at bedside. 

## 2018-09-05 NOTE — ED Triage Notes (Signed)
Pt presents to ED after trip and fall. Pt states he fell on the football field and cut his knee. Laceration noted to left knee.

## 2018-09-06 MED ORDER — CEPHALEXIN 500 MG PO CAPS
500.0000 mg | ORAL_CAPSULE | Freq: Once | ORAL | Status: AC
  Administered 2018-09-06: 500 mg via ORAL
  Filled 2018-09-06: qty 1

## 2018-09-06 MED ORDER — HYDROCODONE-ACETAMINOPHEN 5-325 MG PO TABS: ORAL_TABLET | ORAL | 0 refills | Status: DC

## 2018-09-06 MED ORDER — CEPHALEXIN 500 MG PO CAPS: 500.0000 mg | ORAL_CAPSULE | Freq: Four times a day (QID) | ORAL | 0 refills | Status: DC

## 2018-09-06 MED ORDER — HYDROCODONE-ACETAMINOPHEN 5-325 MG PO TABS
1.0000 | ORAL_TABLET | Freq: Once | ORAL | Status: AC
  Administered 2018-09-06: 1 via ORAL
  Filled 2018-09-06: qty 1

## 2018-09-06 NOTE — Discharge Instructions (Addendum)
Follow-up with Dr. Aline Brochure in the next week, keep leg elevated

## 2018-09-06 NOTE — ED Provider Notes (Signed)
[facility] Provider Note   CSN: [id number] Arrival date & time: 09/05/18  1847     History   Chief Complaint Chief Complaint  Patient presents with  . Laceration    HPI Zachary [name] is a 72 y.o. male.  Patient fell and [place] his left knee his left great toe and his right wrist.  Patient has a laceration to his left knee  The history is provided by the patient.  Fall  This is a new problem. The current episode started less than 1 hour ago. The problem occurs rarely. The problem has been resolved. Pertinent negatives include no chest pain, no abdominal pain and no headaches. Nothing aggravates the symptoms. Nothing relieves the symptoms. He has tried nothing for the symptoms. The treatment provided no relief.    Past Medical History:  Diagnosis Date  . Anxiety    takes Valium as needed  . Arthritis   . Bladder cancer ([place])    takes Rapaflo daily  . Blood dyscrasia    07/08/16: pt told he was a "free bleeder" after bleeding a lot when derm cut off mole on forehead. No excessive bleeding with minor wounds at home, no bleeding problems perioperatively with prior surgeries.  . Chronic back pain    spondylolisthesis  . Cluster headaches   . Depression    takes Lexapro daily  . Essential hypertension, benign    takes Diovan-HCT daily  . Hx of complications due to general anesthesia    Aborted surgery 07/15/2016 due to hypotension per pt  . Joint pain   . Obstructive sleep apnea on CPAP    uses CPAP @ night  . Pneumonia    "several times"--last time about 3-4 yrs ago  . Prediabetes     Patient Active Problem List   Diagnosis Date Noted  . S/P total knee replacement, 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 02/07/2018  . Primary osteoarthritis of left knee 02/01/2018  . Postural hypotension 07/23/2016  . Dysphagia 07/23/2016  . Obstructive sleep apnea 07/21/2016  . Hyperglycemia 07/21/2016  . Anemia, unspecified 07/21/2016  . Spinal stenosis of lumbar region 07/15/2016  .  Bladder tumor 07/27/2015  . HNP (herniated nucleus pulposus), cervical 06/05/2013    Class: Diagnosis of  . 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  . Muscle spasms of neck 04/04/2013  . 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 OA (osteoarthritis) of knee 02/22/2013  . Iliotibial band syndrome of left side 11/22/2012  . Localized, primary osteoarthritis of the ankle and foot 10/11/2012  . Difficulty in walking(719.7) 08/17/2012  . Peroneal tendinitis 08/16/2012  . Precordial pain 02/09/2012  . Essential hypertension, benign 02/09/2012  . Mixed hyperlipidemia 02/09/2012  . Dysthymia 02/04/2012  . Abnormality of gait 12/23/2011  . Lateral epicondylitis/tennis elbow 05/06/2011  . TENOSYNOVITIS OF FOOT AND ANKLE 10/22/2010    Past Surgical History:  Procedure Laterality Date  . ANTERIOR CERVICAL DECOMP/DISCECTOMY FUSION N/A 06/05/2013   Procedure:  C3-4 Anterior Cervical Discectomy and Fusion, Allograft, Plate;  Surgeon: [name], MD;  Location: [facility];  Service: Orthopedics;  Laterality: N/A;  C3-4 Anterior Cervical Discectomy and Fusion, Allograft, Plate  . Arthroscopic knee surgery    . BACK SURGERY      x 2  . BLADDER SURGERY     x 5 to remove tumor  . CARPAL TUNNEL RELEASE    . CATARACT EXTRACTION W/PHACO  12/19/2012   Procedure: CATARACT EXTRACTION PHACO AND INTRAOCULAR LENS PLACEMENT (IOC);  Surgeon: [name], MD;  Location: [facility];  Service: Ophthalmology;  Laterality: Left;  CDE: 26.80  . CATARACT EXTRACTION W/PHACO  01/09/2013   Procedure: CATARACT EXTRACTION PHACO AND INTRAOCULAR LENS PLACEMENT (IOC);  Surgeon: [name], MD;  Location: [facility];  Service: Ophthalmology;  Laterality: Right;  CDE:22.83  . CERVICAL FUSION  06/05/2013   C 3  C4  . CYSTOSCOPY    . REFRACTIVE SURGERY     Hx: of  . RETINAL DETACHMENT SURGERY    . TOTAL KNEE ARTHROPLASTY Left 02/01/2018   Procedure: TOTAL KNEE ARTHROPLASTY;  Surgeon: Carole Civil, MD;  Location: [facility];  Service:  Orthopedics;  Laterality: Left;  . TRANSURETHRAL RESECTION OF BLADDER TUMOR Right 07/27/2015   Procedure: TRANSURETHRAL RESECTION OF BLADDER TUMOR (TURBT);  Surgeon: [name], MD;  Location: [facility];  Service: Urology;  Laterality: Right;  . wisdom tooth extraction          Home Medications    Prior to Admission medications   Medication Sig Start Date End Date Taking? Authorizing Provider  acetaminophen (TYLENOL) 500 MG tablet Take 500-1,000 mg by mouth every 6 (six) hours as needed (for pain.).   Yes [provider]  carbidopa-levodopa (SINEMET IR) 25-100 MG tablet Take 1 tablet by mouth 3 (three) times daily before meals. 09/21/17  Yes Penumalli, Earlean Polka, MD  carboxymethylcellulose (REFRESH PLUS) 0.5 % SOLN Place 1 drop into both eyes 3 (three) times daily as needed (for dry eyes.).    Yes [provider]  diazepam (VALIUM) 5 MG tablet Take 1 tablet (5 mg total) by mouth 2 (two) times daily as needed. for anxiety 07/14/18  Yes Cloria Spring, MD  DULoxetine (CYMBALTA) 60 MG capsule TAKE 1 CAPSULE(60 MG) BY MOUTH DAILY Patient taking differently: Take 60 mg by mouth daily.  07/14/18  Yes Cloria Spring, MD  fluticasone [facility]) 50 MCG/ACT nasal spray Place 1-2 sprays into both nostrils daily as needed (for allergies.).    Yes [provider]  ibuprofen (ADVIL,MOTRIN) 200 MG tablet Take 400 mg by mouth every 8 (eight) hours as needed (for pain.).   Yes [provider]  levocetirizine (XYZAL) 5 MG tablet Take 5 mg by mouth every evening.   Yes [provider]  valsartan-hydrochlorothiazide (DIOVAN-HCT) 320-25 MG tablet Take 1 tablet by mouth daily. 09/02/18  Yes [provider]  cephALEXin (KEFLEX) 500 MG capsule Take 1 capsule (500 mg total) by mouth 4 (four) times daily. 09/06/18   Milton Ferguson, MD  HYDROcodone-acetaminophen (NORCO/VICODIN) 5-325 MG tablet Take 1 every 6 hours for pain not helped by Tylenol or Motrin 09/06/18    Milton Ferguson, MD    Family History Family History  Problem Relation Age of Onset  . Hypertension Father   . Cancer Father        Prostate  . Depression Mother   . Hypertension Mother   . Alcohol abuse Mother   . COPD Mother        passed 07-08-17  . Hypertension Sister   . Anxiety disorder Sister   . Alcohol abuse Maternal Grandfather   . ADD / ADHD Neg Hx   . Bipolar disorder Neg Hx   . Dementia Neg Hx   . Drug abuse Neg Hx   . OCD Neg Hx   . Paranoid behavior Neg Hx   . Schizophrenia Neg Hx   . Seizures Neg Hx   . Sexual abuse Neg Hx   . Physical abuse Neg Hx  Social History Social History   Tobacco Use  . Smoking status: Former Smoker    Last attempt to quit: 12/30/1976    Years since quitting: 41.7  . Smokeless tobacco: Never Used  . Tobacco comment: 1985  Substance Use Topics  . Alcohol use: No    Comment: quit 1990  . Drug use: No     Allergies   Sulfonamide derivatives   Review of Systems Review of Systems  Constitutional: Negative for appetite change and fatigue.  HENT: Negative for congestion, ear discharge and sinus pressure.   Eyes: Negative for discharge.  Respiratory: Negative for cough.   Cardiovascular: Negative for chest pain.  Gastrointestinal: Negative for abdominal pain and diarrhea.  Genitourinary: Negative for frequency and hematuria.  Musculoskeletal: Negative for back pain.       Patient has pain in his left knee with laceration.  Also right wrist and left great toe  Skin: Negative for rash.  Neurological: Negative for seizures and headaches.  Psychiatric/Behavioral: Negative for hallucinations.     Physical Exam Updated Vital Signs BP (!) 152/83 (BP Location: Right Arm)   Pulse 91   Temp 98.1 F (36.7 C) (Tympanic)   Resp 20   Ht 6\' 1"  (1.854 m)   Wt 102.5 kg   SpO2 97%   BMI 29.82 kg/m   Physical Exam  Constitutional: He is oriented to person, place, and time. He appears well-developed.  HENT:  Head:  Normocephalic.  Eyes: Conjunctivae and EOM are normal. No scleral icterus.  Neck: Neck supple. No thyromegaly present.  Cardiovascular: Normal rate and regular rhythm. Exam reveals no gallop and no friction rub.  No murmur heard. Pulmonary/Chest: No stridor. He has no wheezes. He has no rales. He exhibits no tenderness.  Abdominal: He exhibits no distension. There is no tenderness. There is no rebound.  Musculoskeletal: Normal range of motion. He exhibits no edema.  Tenderness and swelling to right wrist and left great toe.  Superficial 5 cm laceration to the left knee  Lymphadenopathy:    He has no cervical adenopathy.  Neurological: He is oriented to person, place, and time. He exhibits normal muscle tone. Coordination normal.  Skin: No rash noted. No erythema.  Psychiatric: He has a normal mood and affect. His behavior is normal.     ED Treatments / Results  Labs (all labs ordered are listed, but only abnormal results are displayed) Labs Reviewed - No data to display  EKG None  Radiology Dg Wrist Complete Right  Result Date: 09/05/2018 CLINICAL DATA:  Initial evaluation for acute trauma, fall. EXAM: RIGHT WRIST - COMPLETE 3+ VIEW COMPARISON:  None. FINDINGS: No acute fracture or dislocation. Osteoarthritic changes present about the distal radioulnar articulation. Remotely healed ulnar styloid fracture noted. Faint osseous density at the dorsal aspect of the proximal hand on lateral view may reflect remote triquetral fracture and/or calcific tendinopathy. Additional osteoarthritic changes noted within the hand. Mild soft tissue swelling about the wrist. Prominent vascular calcifications noted. IMPRESSION: 1. No acute osseous abnormality about the hand. 2. Mild diffuse soft tissue swelling about the wrist. 3. Remote ulnar styloid fracture. 4. Additional vague calcific density at the dorsal aspect of the proximal hand favored to reflect sequelae of calcific tendinopathy. Remote triquetral  fracture could also be considered. Electronically Signed   By: Jeannine Boga M.D.   On: 09/05/2018 23:09   Dg Knee Complete 4 Views Left  Result Date: 09/05/2018 CLINICAL DATA:  Initial evaluation for acute trauma, fall. EXAM: LEFT  KNEE - COMPLETE 4+ VIEW COMPARISON:  Prior radiograph from 03/26/2018. FINDINGS: Left total knee arthroplasty in place. No periprosthetic lucency to suggest loosening or failure. No acute fracture or dislocation. No appreciable joint effusion. Osteopenia noted. Probable small soft tissue laceration at the infrapatellar region anteriorly. No radiopaque foreign body. Prominent vascular calcifications noted. IMPRESSION: 1. No acute fracture or dislocation. 2. Left total knee arthroplasty in place without complication. 3. Soft tissue laceration at the anterior aspect of the infrapatellar knee. No radiopaque foreign body. Electronically Signed   By: Jeannine Boga M.D.   On: 09/05/2018 23:11   Dg Toe Great Left  Result Date: 09/05/2018 CLINICAL DATA:  Initial evaluation for acute trauma, fall. EXAM: LEFT GREAT TOE COMPARISON:  None. FINDINGS: No acute fracture or dislocation. No appreciable soft tissue injury. Mild osteoarthritic changes noted within the visualized foot. Osteopenia. Prominent vascular calcifications noted. IMPRESSION: No acute osseous abnormality about the left great toe. Electronically Signed   By: Jeannine Boga M.D.   On: 09/05/2018 23:13    Procedures .Marland KitchenLaceration Repair Date/Time: 09/06/2018 12:12 AM Performed by: Milton Ferguson, MD Authorized by: Milton Ferguson, MD   Consent:    Consent obtained:  Verbal   Consent given by:  Patient   Risks discussed:  Infection   Alternatives discussed:  No treatment Anesthesia (see MAR for exact dosages):    Anesthesia method:  Local infiltration   Local anesthetic:  Lidocaine 1% w/o epi Laceration details:    Location: Right knee.   Length (cm):  5   Depth (mm):  5 Repair type:    Repair  type:  Simple Pre-procedure details:    Preparation:  Patient was prepped and draped in usual sterile fashion Exploration:    Hemostasis achieved with:  Direct pressure   Wound exploration: entire depth of wound probed and visualized     Wound extent: areolar tissue violated     Contaminated: no   Treatment:    Area cleansed with:  Betadine and saline   Amount of cleaning:  Extensive   Irrigation solution:  Sterile water   Irrigation volume:  100 cc   Irrigation method:  Syringe   Visualized foreign bodies/material removed: no   Skin repair:    Repair method:  Sutures   Suture size:  4-0   Suture material:  Nylon   Suture technique:  Simple interrupted   Number of sutures: 8. Approximation:    Approximation:  Close Post-procedure details:    Dressing:  Non-adherent dressing   Patient tolerance of procedure:  Tolerated well, no immediate complications Comments:     Patient is a 5 cm laceration to his left knee that superficial.  The area was cleaned thoroughly with Betadine and then was irrigated thoroughly with saline.  1% lidocaine without epi was used to anesthetize the area.  Eight 4-0 nylon sutures were used to close the laceration.  The patient tolerated procedure well   (including critical care time)  Medications Ordered in ED Medications  lidocaine (PF) (XYLOCAINE) 1 % injection (has no administration in time range)  lidocaine (PF) (XYLOCAINE) 1 % injection (has no administration in time range)  lidocaine (PF) (XYLOCAINE) 1 % injection (has no administration in time range)  cephALEXin (KEFLEX) capsule 500 mg (has no administration in time range)     Initial Impression / Assessment and Plan / ED Course  I have reviewed the triage vital signs and the nursing notes.  Pertinent labs & imaging results that were available during my  care of the patient were reviewed by me and considered in my medical decision making (see chart for details).    Patient with fall.  Patient  has a contusion and sprain right wrist he will get a splint on.  He has a contusion to the left great toe which she will get a postop shoe.  He also has a laceration to the left knee that has been sutured and he will follow-up in 10 to 14 days  Final Clinical Impressions(s) / ED Diagnoses   Final diagnoses:  Fall    ED Discharge Orders         Ordered    HYDROcodone-acetaminophen (NORCO/VICODIN) 5-325 MG tablet     09/06/18 0011    cephALEXin (KEFLEX) 500 MG capsule  4 times daily     09/06/18 0011           Milton Ferguson, MD 09/09/18 1241

## 2018-09-06 NOTE — ED Notes (Signed)
Post op shoe applied to left foot, wrist splint applied to right wrist, bandage applied to left knee, abrasion to right lower leg and left forearm cleaned with normal saline, bandaid applied, cms intact all extremities, pt tolerated well,

## 2018-09-07 ENCOUNTER — Ambulatory Visit: Payer: PPO | Admitting: Orthopedic Surgery

## 2018-09-07 ENCOUNTER — Encounter: Payer: Self-pay | Admitting: Orthopedic Surgery

## 2018-09-07 VITALS — BP 156/80 | HR 76 | Ht 73.0 in | Wt 227.0 lb

## 2018-09-07 DIAGNOSIS — S63511A Sprain of carpal joint of right wrist, initial encounter: Secondary | ICD-10-CM | POA: Diagnosis not present

## 2018-09-07 DIAGNOSIS — S50812A Abrasion of left forearm, initial encounter: Secondary | ICD-10-CM

## 2018-09-07 DIAGNOSIS — S90112A Contusion of left great toe without damage to nail, initial encounter: Secondary | ICD-10-CM

## 2018-09-07 DIAGNOSIS — S81012A Laceration without foreign body, left knee, initial encounter: Secondary | ICD-10-CM

## 2018-09-07 DIAGNOSIS — S80811A Abrasion, right lower leg, initial encounter: Secondary | ICD-10-CM | POA: Diagnosis not present

## 2018-09-07 NOTE — Progress Notes (Signed)
Chief Complaint  Patient presents with  . Knee Injury    fall left knee pain , s/p knee replacement 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   History 72 year old male had a knee replacement earlier this year fell again landed on his left knee sustained a laceration which was repaired in the emergency room.  The laceration is approximately mid incision is placed on Keflex.  He also sustained a contusion to his right wrist with an underlying previous right wrist fracture, and abrasion is noted over the left forearm right leg he bruised his left great toe  The location of injuries as described he has primarily noted pain in his great toe and right wrist he is wearing a knee brace on that.  Pain is mild at its dull it aches at its present since 09/05/2018 bracing seems to help his wrist his knee is feeling okay his abrasions are little sore  Review of Systems  Constitutional: Negative for chills, fever and weight loss.  Respiratory: Negative for shortness of breath.   Cardiovascular: Negative for chest pain.  Neurological: Negative for tingling.   Past Medical History:  Diagnosis Date  . Anxiety    takes Valium as needed  . Arthritis   . Bladder cancer ([place])    takes Rapaflo daily  . Blood dyscrasia    07/08/16: pt told he was a "free bleeder" after bleeding a lot when derm cut off mole on forehead. No excessive bleeding with minor wounds at home, no bleeding problems perioperatively with prior surgeries.  . Chronic back pain    spondylolisthesis  . Cluster headaches   . Depression    takes Lexapro daily  . Essential hypertension, benign    takes Diovan-HCT daily  . Hx of complications due to general anesthesia    Aborted surgery 07/15/2016 due to hypotension per pt  . Joint pain   . Obstructive sleep apnea on CPAP    uses CPAP @ night  . Pneumonia    "several times"--last time about 3-4 yrs ago  . Prediabetes    BP (!) 156/80   Pulse 76   Ht 6\' 1"  (1.854 m)   Wt 227 lb (103 kg)   BMI 29.95 kg/m   Physical Exam  Constitutional: He is oriented to person, place, and time. He appears well-developed and well-nourished.  Vital signs have been reviewed and are stable. Gen. appearance the patient is well-developed and well-nourished with normal grooming and hygiene.   Neurological: He is alert and oriented to person, place, and time. Gait normal.  Skin: Skin is warm and dry. No erythema.  Psychiatric: He has a normal mood and affect.  Vitals reviewed.  Right arm inspection tenderness over the dorsum of the wrist with swelling painful range of motion each direction with chronic decreased range of motion wrist joint is stable no muscle atrophy no tremor skin is warm dry and intact without abrasion or laceration pulse and temperature are normal sensation is intact  Left lower extremity laceration noted over the midportion of the left knee with no surrounding erythema suture line is intact no drainage range of motion has been maintained as prior with a slight flexion contracture and 120 degrees of flexion knee remains stable muscle tone and strength are normal skin as described no peripheral edema normal sensation  Left great toe bruise at the IP joint tender to palpation normal range of motion which for him is decreased range of motion of the MP joint no instability  Abrasion right leg abrasion left  forearm small abrasions less than a centimeter in length    I recommend he use Neosporin over his abrasions and his left knee wound use bandages to keep things covered to wear wrist splint for 4 weeks come back to see me for suture removal next week.  Encounter Diagnoses  Name Primary?  . Sprain of carpal joint of right wrist, initial encounter Yes  . Laceration of skin of left knee, initial encounter   . Contusion of left great toe without damage to nail, initial encounter   . Abrasion of left forearm, initial encounter   . Abrasion of right lower extremity, initial encounter

## 2018-09-12 ENCOUNTER — Encounter: Payer: Self-pay | Admitting: Orthopedic Surgery

## 2018-09-12 ENCOUNTER — Ambulatory Visit (INDEPENDENT_AMBULATORY_CARE_PROVIDER_SITE_OTHER): Payer: PPO | Admitting: Orthopedic Surgery

## 2018-09-12 VITALS — BP 134/82 | HR 88 | Ht 73.0 in | Wt 222.0 lb

## 2018-09-12 DIAGNOSIS — S81012D Laceration without foreign body, left knee, subsequent encounter: Secondary | ICD-10-CM

## 2018-09-14 ENCOUNTER — Ambulatory Visit (INDEPENDENT_AMBULATORY_CARE_PROVIDER_SITE_OTHER): Payer: PPO | Admitting: Orthopedic Surgery

## 2018-09-14 DIAGNOSIS — S81012D Laceration without foreign body, left knee, subsequent encounter: Secondary | ICD-10-CM

## 2018-09-14 NOTE — Progress Notes (Signed)
Chief complaint check left knee wound  72 year old male had a laceration left knee here for suture removal  The wound looks clean dry and intact without erythema or drainage or infection sutures removed follow-up as needed related to this  Encounter Diagnosis  Name Primary?  . Laceration of left knee, subsequent encounter Yes

## 2018-09-20 ENCOUNTER — Encounter: Payer: Self-pay | Admitting: "Endocrinology

## 2018-09-20 ENCOUNTER — Ambulatory Visit (INDEPENDENT_AMBULATORY_CARE_PROVIDER_SITE_OTHER): Payer: PPO | Admitting: "Endocrinology

## 2018-09-20 VITALS — BP 131/77 | HR 66 | Ht 73.0 in | Wt 221.0 lb

## 2018-09-20 DIAGNOSIS — E0590 Thyrotoxicosis, unspecified without thyrotoxic crisis or storm: Secondary | ICD-10-CM | POA: Insufficient documentation

## 2018-09-20 DIAGNOSIS — R7303 Prediabetes: Secondary | ICD-10-CM | POA: Diagnosis not present

## 2018-09-20 NOTE — Progress Notes (Signed)
Endocrinology Consult Note    Subjective:    Patient ID: Zachary [name], male    DOB: [birth year], PCP Celene Squibb, MD.   Past Medical History:  Diagnosis Date  . Anxiety    takes Valium as needed  . Arthritis   . Bladder cancer ([place])    takes Rapaflo daily  . Blood dyscrasia    07/08/16: pt told he was a "free bleeder" after bleeding a lot when derm cut off mole on forehead. No excessive bleeding with minor wounds at home, no bleeding problems perioperatively with prior surgeries.  . Chronic back pain    spondylolisthesis  . Cluster headaches   . Depression    takes Lexapro daily  . Essential hypertension, benign    takes Diovan-HCT daily  . Hx of complications due to general anesthesia    Aborted surgery 07/15/2016 due to hypotension per pt  . Joint pain   . Obstructive sleep apnea on CPAP    uses CPAP @ night  . Pneumonia    "several times"--last time about 3-4 yrs ago  . Prediabetes    Past Surgical History:  Procedure Laterality Date  . ANTERIOR CERVICAL DECOMP/DISCECTOMY FUSION N/A 06/05/2013   Procedure:  C3-4 Anterior Cervical Discectomy and Fusion, Allograft, Plate;  Surgeon: [name], MD;  Location: [facility];  Service: Orthopedics;  Laterality: N/A;  C3-4 Anterior Cervical Discectomy and Fusion, Allograft, Plate  . Arthroscopic knee surgery    . BACK SURGERY      x 2  . BLADDER SURGERY     x 5 to remove tumor  . CARPAL TUNNEL RELEASE    . CATARACT EXTRACTION W/PHACO  12/19/2012   Procedure: CATARACT EXTRACTION PHACO AND INTRAOCULAR LENS PLACEMENT (IOC);  Surgeon: [name], MD;  Location: [facility];  Service: Ophthalmology;  Laterality: Left;  CDE: 26.80  . CATARACT EXTRACTION W/PHACO  01/09/2013   Procedure: CATARACT EXTRACTION PHACO AND INTRAOCULAR LENS PLACEMENT (IOC);  Surgeon: [name], MD;  Location: [facility];  Service: Ophthalmology;  Laterality: Right;  CDE:22.83  . CERVICAL FUSION  06/05/2013   C 3  C4  . CYSTOSCOPY    . REFRACTIVE SURGERY     Hx:  of  . RETINAL DETACHMENT SURGERY    . TOTAL KNEE ARTHROPLASTY Left 02/01/2018   Procedure: TOTAL KNEE ARTHROPLASTY;  Surgeon: Carole Civil, MD;  Location: [facility];  Service: Orthopedics;  Laterality: Left;  . TRANSURETHRAL RESECTION OF BLADDER TUMOR Right 07/27/2015   Procedure: TRANSURETHRAL RESECTION OF BLADDER TUMOR (TURBT);  Surgeon: [name], MD;  Location: [facility];  Service: Urology;  Laterality: Right;  . wisdom tooth extraction     Social History   Socioeconomic History  . Marital status: Married    Spouse name: [name]   . Number of children: 0  . Years of education: 41  . Highest education level: Not on file  Occupational History  . Occupation: Retired    Fish farm manager: UNEMPLOYED  Social Needs  . Financial resource strain: Not on file  . Food insecurity:    Worry: Not on file    Inability: Not on file  . Transportation needs:    Medical: Not on file    Non-medical: Not on file  Tobacco Use  . Smoking status: Former Smoker    Last attempt to quit: 12/30/1976    Years since quitting: 41.7  . Smokeless tobacco: Never Used  . Tobacco comment: 1985  Substance and Sexual Activity  . Alcohol use:  No    Comment: quit 1990  . Drug use: No  . Sexual activity: Yes  Lifestyle  . Physical activity:    Days per week: Not on file    Minutes per session: Not on file  . Stress: Not on file  Relationships  . Social connections:    Talks on phone: Not on file    Gets together: Not on file    Attends religious service: Not on file    Active member of club or organization: Not on file    Attends meetings of clubs or organizations: Not on file    Relationship status: Not on file  Other Topics Concern  . Not on file  Social History Narrative   Lives with wife    caffeine- sodas   Outpatient Encounter Medications as of 09/20/2018  Medication Sig  . acetaminophen (TYLENOL) 500 MG tablet Take 500-1,000 mg by mouth every 6 (six) hours as needed (for pain.).  Marland Kitchen  carbidopa-levodopa (SINEMET IR) 25-100 MG tablet Take 1 tablet by mouth 3 (three) times daily before meals.  . diazepam (VALIUM) 5 MG tablet Take 1 tablet (5 mg total) by mouth 2 (two) times daily as needed. for anxiety  . DULoxetine (CYMBALTA) 60 MG capsule TAKE 1 CAPSULE(60 MG) BY MOUTH DAILY (Patient taking differently: Take 60 mg by mouth daily. )  . valsartan-hydrochlorothiazide (DIOVAN-HCT) 320-25 MG tablet Take 1 tablet by mouth daily.  . [DISCONTINUED] carboxymethylcellulose (REFRESH PLUS) 0.5 % SOLN Place 1 drop into both eyes 3 (three) times daily as needed (for dry eyes.).   . [DISCONTINUED] cephALEXin (KEFLEX) 500 MG capsule Take 1 capsule (500 mg total) by mouth 4 (four) times daily.  . [DISCONTINUED] fluticasone (FLONASE) 50 MCG/ACT nasal spray Place 1-2 sprays into both nostrils daily as needed (for allergies.).   . [DISCONTINUED] HYDROcodone-acetaminophen (NORCO/VICODIN) 5-325 MG tablet Take 1 every 6 hours for pain not helped by Tylenol or Motrin (Patient not taking: Reported on 09/07/2018)  . [DISCONTINUED] ibuprofen (ADVIL,MOTRIN) 200 MG tablet Take 400 mg by mouth every 8 (eight) hours as needed (for pain.).  . [DISCONTINUED] levocetirizine (XYZAL) 5 MG tablet Take 5 mg by mouth every evening.   No facility-administered encounter medications on file as of 09/20/2018.     ALLERGIES: Allergies  Allergen Reactions  . Sulfonamide Derivatives Itching, Rash and Other (See Comments)    VACCINATION STATUS: Immunization History  Administered Date(s) Administered  . Influenza-Unspecified 07/28/2014, 09/28/2015     HPI  Zachary [name] is 72 y.o. male who presents today with a medical history as above. he is being seen in consultation for recent abnormal thyroid function test suggesting hyperthyroidism requested by Celene Squibb, MD. -He denies any prior history of thyroid dysfunction.  He denies recent weight change, palpitations, tremors, nor heat/cold intolerance. He was found  to have slightly suppressed TSH of 0.1 7 associated with normal free T4, total T3, and total T4 on August 01, 2018.  he denies dysphagia, choking, shortness of breath, no recent voice change.    he his family history of thyroid dysfunction, nor any family history of thyroid cancer.  he denies personal history of goiter. he is not on any anti-thyroid medications nor on any thyroid hormone supplements. he  is willing to proceed with appropriate work up and therapy for thyrotoxicosis.                           Review of systems  Constitutional: - weight loss, - fatigue, - subjective hyperthermia Eyes: no blurry vision, - xerophthalmia ENT: no sore throat, no nodules palpated in throat, no dysphagia/odynophagia, nor hoarseness Cardiovascular: no Chest Pain, no Shortness of Breath, - palpitations, no leg swelling Respiratory: no cough, no SOB Gastrointestinal: no Nausea, no Vomiting, no Diarhhea Musculoskeletal: no muscle/joint aches Skin: no rashes Neurological: - tremors, no numbness, no tingling, no dizziness Psychiatric: no depression, - anxiety   Objective:    BP 131/77   Pulse 66   Ht 6\' 1"  (1.854 m)   Wt 221 lb (100.2 kg)   BMI 29.16 kg/m   Wt Readings from Last 3 Encounters:  09/20/18 221 lb (100.2 kg)  09/12/18 222 lb (100.7 kg)  09/07/18 227 lb (103 kg)                                                Physical exam  Constitutional: + slightly over weight for height, not in acute distress, + normal state of mind Eyes: PERRLA, EOMI, - exophthalmos ENT: moist mucous membranes, - thyromegaly, no cervical lymphadenopathy Cardiovascular: + Normal precordial activity, + normal rate and Rhythm, no Murmur/Rubs/Gallops Respiratory:  adequate breathing efforts, no gross chest deformity, Clear to auscultation bilaterally Gastrointestinal: abdomen soft, Non -tender, No distension, Bowel Sounds present Musculoskeletal: no gross deformities, strength intact in all four  extremities Skin: moist, warm, no rashes Neurological: + tremor with outstretched hands (he has Parkinson's disease on medications)    CMP     Component Value Date/Time   NA 138 02/18/2018 1030   K 5.2 (H) 02/18/2018 1030   CL 99 (L) 02/18/2018 1030   CO2 28 02/18/2018 1030   GLUCOSE 85 02/18/2018 1030   BUN 28 (H) 02/18/2018 1030   CREATININE 1.32 (H) 02/18/2018 1030   CALCIUM 9.7 02/18/2018 1030   PROT 7.9 02/18/2018 1030   ALBUMIN 4.0 02/18/2018 1030   AST 24 02/18/2018 1030   ALT 14 (L) 02/18/2018 1030   ALKPHOS 103 02/18/2018 1030   BILITOT 0.6 02/18/2018 1030   GFRNONAA 53 (L) 02/18/2018 1030   GFRAA >60 02/18/2018 1030     CBC    Component Value Date/Time   WBC 9.8 02/18/2018 1030   RBC 5.02 02/18/2018 1030   HGB 14.5 02/18/2018 1030   HCT 46.4 02/18/2018 1030   PLT 405 (H) 02/18/2018 1030   MCV 92.4 02/18/2018 1030   MCH 28.9 02/18/2018 1030   MCHC 31.3 02/18/2018 1030   RDW 13.4 02/18/2018 1030   LYMPHSABS 2.0 02/18/2018 1030   MONOABS 0.6 02/18/2018 1030   EOSABS 0.2 02/18/2018 1030   BASOSABS 0.1 02/18/2018 1030     Diabetic Labs (most recent): Lab Results  Component Value Date   HGBA1C 5.8 (H) 07/22/2016    August 01, 2018 labs TSH 0.2171, free T4 1.4 7, total T4 8.8, total T3 144, thyroid peroxidase antibodies elevated at 102.    Assessment & Plan:   1. Subclinical hyperthyroidism he is being seen at a kind request of Celene Squibb, MD. his history and most recent labs are reviewed, and he was examined clinically. Subjective and objective findings are inonsistent with thyrotoxicosis .  However patient will need confirmatory study with thyroid uptake and scan.  If thyroid uptake and scan is consistent with thyrotoxicosis, he will be approached for definitive treatment.  Options of therapy,  if hyperthyroidism is confirmed, are briefly discussed with him.    -I did not initiate any prescriptions for him today. - I advised him to maintain  close follow up with Celene Squibb, MD for primary care needs.   Follow up plan: Return in about 10 days (around 09/30/2018) for Follow up with Thyroid Uptake and Scan.   Thank you for involving me in the care of this pleasant patient, and I will continue to update you with his progress.  Glade Lloyd, MD [facility] Phone: [phone]  Fax: [phone]   09/20/2018, 7:15 PM  This note was partially dictated with voice recognition software. Similar sounding words can be transcribed inadequately or may not  be corrected upon review.

## 2018-09-25 DIAGNOSIS — Z1211 Encounter for screening for malignant neoplasm of colon: Secondary | ICD-10-CM | POA: Diagnosis not present

## 2018-09-25 DIAGNOSIS — Z1212 Encounter for screening for malignant neoplasm of rectum: Secondary | ICD-10-CM | POA: Diagnosis not present

## 2018-09-27 ENCOUNTER — Encounter: Payer: Self-pay | Admitting: Diagnostic Neuroimaging

## 2018-09-27 ENCOUNTER — Ambulatory Visit: Payer: Medicare HMO | Admitting: Diagnostic Neuroimaging

## 2018-09-27 VITALS — BP 139/72 | HR 89 | Ht 73.0 in | Wt 221.2 lb

## 2018-09-27 DIAGNOSIS — R269 Unspecified abnormalities of gait and mobility: Secondary | ICD-10-CM

## 2018-09-27 DIAGNOSIS — R413 Other amnesia: Secondary | ICD-10-CM

## 2018-09-27 DIAGNOSIS — G20 Parkinson's disease: Secondary | ICD-10-CM | POA: Diagnosis not present

## 2018-09-27 DIAGNOSIS — G20C Parkinsonism, unspecified: Secondary | ICD-10-CM

## 2018-09-27 NOTE — Progress Notes (Signed)
[facility]  PATIENT: Zachary [name] DOB: September 13, 1946  REFERRING CLINICIAN: Merlyn Albert, MD HISTORY FROM: patient and wife REASON FOR VISIT: follow up   HISTORICAL  CHIEF COMPLAINT:  Chief Complaint  Patient presents with  . Parkinsonism    rm 7, wife- [name], "no new concerns" MMSE 29  . Follow-up    9 month    HISTORY OF PRESENT ILLNESS:   UPDATE (09/27/18, VRP): Since last visit, doing well. Symptoms are stable. Severity is mild. No alleviating or aggravating factors. Tolerating meds.  2 falls since last visit.   UPDATE (12/22/17, VRP): Since last visit, doing about the same. Wife is under high stress. She feels that patient has lost motivation. Patient feels fair. Tolerating meds. No alleviating or aggravating factors.   UPDATE 09/21/17: Since last visit, stable, but out of meds for anxiety. Needs a new psychiatry clinic.   UPDATE 04/01/17: Since last visit has had MRI brain and c-spine, and had PT evaluation. Memory loss and gait issues are stable. Has life stress with concern about his mother's health. He is looking forward to football pre-season starting.   PRIOR HPI (01/15/17): 72 year old male here for evaluation of memory problems and gait difficulty. Patient reports memory problems for past 1 year. Wife thinks he has been having memory problem for at least 3 years. He is forgetting dates, fax, having trouble with comprehension, sleeping more. Also patient having more balance and walking problems. She has noticed that patient is taking small shuffling steps. 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 Following these 2 surgeries he had extensive physical therapy and rehabilitation. Patient attributes his balance problems to his low back surgery problem. Patient also has history of bladder cancer, hypertension, ringing in ears, depression, anxiety. Patient has had  deterioration handwriting, irregular sleep, soft and hoarse voice. Patient having some intermittent tremor in his hands.   REVIEW OF SYSTEMS: Full 14 system review of systems performed and negative with exception of: walk diff ringing in ears.    ALLERGIES: Allergies  Allergen Reactions  . Sulfonamide Derivatives Itching, Rash and Other (See Comments)    HOME MEDICATIONS: Outpatient Medications Prior to Visit  Medication Sig Dispense Refill  . acetaminophen (TYLENOL) 500 MG tablet Take 500-1,000 mg by mouth every 6 (six) hours as needed (for pain.).    Marland Kitchen carbidopa-levodopa (SINEMET IR) 25-100 MG tablet Take 1 tablet by mouth 3 (three) times daily before meals. 270 tablet 4  . diazepam (VALIUM) 5 MG tablet Take 1 tablet (5 mg total) by mouth 2 (two) times daily as needed. for anxiety 60 tablet 2  . DULoxetine (CYMBALTA) 60 MG capsule TAKE 1 CAPSULE(60 MG) BY MOUTH DAILY (Patient taking differently: Take 60 mg by mouth daily. ) 90 capsule 3  . valsartan-hydrochlorothiazide (DIOVAN-HCT) 320-25 MG tablet Take 1 tablet by mouth daily.     No facility-administered medications prior to visit.     PAST MEDICAL HISTORY: Past Medical History:  Diagnosis Date  . Anxiety    takes Valium as needed  . Arthritis   . Bladder cancer ([place])    takes Rapaflo daily  . Blood dyscrasia    07/08/16: pt told he was a "free bleeder" after bleeding a lot when derm cut off mole on forehead. No excessive bleeding with minor wounds at home, no bleeding problems perioperatively with prior surgeries.  . Chronic back pain    spondylolisthesis  .  Cluster headaches   . Depression    takes Lexapro daily  . Essential hypertension, benign    takes Diovan-HCT daily  . Hx of complications due to general anesthesia    Aborted surgery 07/15/2016 due to hypotension per pt  . Joint pain   . Obstructive sleep apnea on CPAP    uses CPAP @ night  . Parkinsonism ([place])   . Pneumonia    "several times"--last time about  3-4 yrs ago  . Prediabetes     PAST SURGICAL HISTORY: Past Surgical History:  Procedure Laterality Date  . ANTERIOR CERVICAL DECOMP/DISCECTOMY FUSION N/A 06/05/2013   Procedure:  C3-4 Anterior Cervical Discectomy and Fusion, Allograft, Plate;  Surgeon: [name], MD;  Location: [facility];  Service: Orthopedics;  Laterality: N/A;  C3-4 Anterior Cervical Discectomy and Fusion, Allograft, Plate  . Arthroscopic knee surgery    . BACK SURGERY      x 2  . BLADDER SURGERY     x 5 to remove tumor  . CARPAL TUNNEL RELEASE    . CATARACT EXTRACTION W/PHACO  12/19/2012   Procedure: CATARACT EXTRACTION PHACO AND INTRAOCULAR LENS PLACEMENT (IOC);  Surgeon: [name], MD;  Location: [facility];  Service: Ophthalmology;  Laterality: Left;  CDE: 26.80  . CATARACT EXTRACTION W/PHACO  01/09/2013   Procedure: CATARACT EXTRACTION PHACO AND INTRAOCULAR LENS PLACEMENT (IOC);  Surgeon: [name], MD;  Location: [facility];  Service: Ophthalmology;  Laterality: Right;  CDE:22.83  . CERVICAL FUSION  06/05/2013   C 3  C4  . CYSTOSCOPY    . REFRACTIVE SURGERY     Hx: of  . RETINAL DETACHMENT SURGERY    . TOTAL KNEE ARTHROPLASTY Left 02/01/2018   Procedure: TOTAL KNEE ARTHROPLASTY;  Surgeon: Carole Civil, MD;  Location: [facility];  Service: Orthopedics;  Laterality: Left;  . TRANSURETHRAL RESECTION OF BLADDER TUMOR Right 07/27/2015   Procedure: TRANSURETHRAL RESECTION OF BLADDER TUMOR (TURBT);  Surgeon: [name], MD;  Location: [facility];  Service: Urology;  Laterality: Right;  . wisdom tooth extraction      FAMILY HISTORY: Family History  Problem Relation Age of Onset  . Hypertension Father   . Cancer Father        Prostate  . Depression Mother   . Hypertension Mother   . Alcohol abuse Mother   . COPD Mother        passed 07-08-17  . Hypertension Sister   . Anxiety disorder Sister   . Alcohol abuse Maternal Grandfather   . ADD / ADHD Neg Hx   . Bipolar disorder Neg Hx   . Dementia Neg Hx   . Drug  abuse Neg Hx   . OCD Neg Hx   . Paranoid behavior Neg Hx   . Schizophrenia Neg Hx   . Seizures Neg Hx   . Sexual abuse Neg Hx   . Physical abuse Neg Hx     SOCIAL HISTORY:  Social History   Socioeconomic History  . Marital status: Married    Spouse name: [name]   . Number of children: 0  . Years of education: 52  . Highest education level: Not on file  Occupational History  . Occupation: Retired    Fish farm manager: UNEMPLOYED  Social Needs  . Financial resource strain: Not on file  . Food insecurity:    Worry: Not on file    Inability: Not on file  . Transportation needs:    Medical: Not on file    Non-medical:  Not on file  Tobacco Use  . Smoking status: Former Smoker    Last attempt to quit: 12/30/1976    Years since quitting: 41.7  . Smokeless tobacco: Never Used  . Tobacco comment: 1985  Substance and Sexual Activity  . Alcohol use: No    Comment: quit 1990  . Drug use: No  . Sexual activity: Yes  Lifestyle  . Physical activity:    Days per week: Not on file    Minutes per session: Not on file  . Stress: Not on file  Relationships  . Social connections:    Talks on phone: Not on file    Gets together: Not on file    Attends religious service: Not on file    Active member of club or organization: Not on file    Attends meetings of clubs or organizations: Not on file    Relationship status: Not on file  . Intimate partner violence:    Fear of current or ex partner: Not on file    Emotionally abused: Not on file    Physically abused: Not on file    Forced sexual activity: Not on file  Other Topics Concern  . Not on file  Social History Narrative   Lives with wife    caffeine- sodas     PHYSICAL EXAM  GENERAL EXAM/CONSTITUTIONAL: Vitals:  Vitals:   09/27/18 0803  BP: 139/72  Pulse: 89  Weight: 221 lb 3.2 oz (100.3 kg)  Height: 6\' 1"  (1.854 m)   Body mass index is 29.18 kg/m. No exam data present  Patient is in no distress; well developed, nourished  and groomed; neck is supple  FLAT AFFECT, MASKED FACIES  CARDIOVASCULAR:  Examination of carotid arteries is normal; no carotid bruits  Regular rate and rhythm, no murmurs  Examination of peripheral vascular system by observation and palpation is normal  EYES:  Ophthalmoscopic exam of optic discs and posterior segments is normal; no papilledema or hemorrhages  MUSCULOSKELETAL:  Gait, strength, tone, movements noted in Neurologic exam below  NEUROLOGIC: MENTAL STATUS:  MMSE - [place] Exam 09/27/2018 12/22/2017 09/21/2017  Orientation to time 5 5 5   Orientation to Place 5 5 4   Registration 3 3 3   Attention/ Calculation 5 5 4   Recall 3 3 3   Language- name 2 objects 2 2 2   Language- repeat 1 1 1   Language- follow 3 step command 2 3 3   Language- follow 3 step command-comments handed paper back to RN - -  Language- read & follow direction 1 1 1   Write a sentence 1 1 1   Copy design 1 1 1   Total score 29 30 28     awake, alert, oriented to person, place and time  recent and remote memory intact  normal attention and concentration  language fluent, comprehension intact, naming intact,   fund of knowledge appropriate  CRANIAL NERVE:   2nd - no papilledema on fundoscopic exam  2nd, 3rd, 4th, 6th - pupils equal and reactive to light, visual fields full to confrontation, extraocular muscles intact, no nystagmus  5th - facial sensation symmetric  7th - facial strength symmetric  8th - hearing intact  9th - palate elevates symmetrically, uvula midline  11th - shoulder shrug symmetric  12th - tongue protrusion midline  SOFT HOARSE VOICE  MASKED FACIES  MOTOR:   normal bulk; full strength in the BUE, BLE  NO BRADYKINESIA; NO RIGIDITY  MINIMAL RESTING TREMOR IN RUE > LUE WITH CONTRALATERAL RAM  SENSORY:   normal and symmetric to light touch, temperature, vibration; EXCEPT DECR IN FEET  COORDINATION:   finger-nose-finger, fine finger movements  SLOW  REFLEXES:   deep tendon reflexes TRACE and symmetric  GAIT/STATION:   narrow based gait; DECR RIGHT ARM SWING; STOOPED POSTURE     DIAGNOSTIC DATA (LABS, IMAGING, TESTING) - I reviewed patient records, labs, notes, testing and imaging myself where available.  Lab Results  Component Value Date   WBC 9.8 02/18/2018   HGB 14.5 02/18/2018   HCT 46.4 02/18/2018   MCV 92.4 02/18/2018   PLT 405 (H) 02/18/2018      Component Value Date/Time   NA 138 02/18/2018 1030   K 5.2 (H) 02/18/2018 1030   CL 99 (L) 02/18/2018 1030   CO2 28 02/18/2018 1030   GLUCOSE 85 02/18/2018 1030   BUN 28 (H) 02/18/2018 1030   CREATININE 1.32 (H) 02/18/2018 1030   CALCIUM 9.7 02/18/2018 1030   PROT 7.9 02/18/2018 1030   ALBUMIN 4.0 02/18/2018 1030   AST 24 02/18/2018 1030   ALT 14 (L) 02/18/2018 1030   ALKPHOS 103 02/18/2018 1030   BILITOT 0.6 02/18/2018 1030   GFRNONAA 53 (L) 02/18/2018 1030   GFRAA >60 02/18/2018 1030   No results found for: CHOL, HDL, LDLCALC, LDLDIRECT, TRIG, CHOLHDL Lab Results  Component Value Date   HGBA1C 5.8 (H) 07/22/2016   No results found for: VITAMINB12 No results found for: TSH  01/28/12 MRI brain  - Nonspecific slightly atypical white matter type changes as noted above. - No internal auditory canal or skull base abnormality is noted.  01/28/12 MRA head - Intracranial atherosclerotic type changes predominant involving branch vessels as detailed above.  01/28/12 MRV head - Loss of signal of the left transverse sinus probably is related to artifact rather than true stenosis. Overall, the major dural sinuses appear patent.   05/09/13 MRI cervical [I reviewed images myself and agree with interpretation. -VRP]  - Multilevel cervical spondylosis detailed above.  Progressive C3-C4 degenerative disc disease with flattening of the cervical cord. Moderate to severe central stenosis with 7 mm AP diameter of the thecal sac.  No cord edema.  C3-C4 further collapse  of the disc space and the extrusion appears slightly larger than on the prior exam.  Other levels appear similar with multilevel mild central stenosis and bilateral foraminal stenosis.  In this patient with left upper extremity radiculopathy, the left-sided foraminal stenosis is most pronounced at C6-C7, potentially affecting the left C7 nerve.  06/12/16 MRI lumbar spine [I reviewed images myself and agree with interpretation. -VRP]  - Severe stenosis at L3-4 is multifactorial, related to central protrusion, posterior element hypertrophy and short pedicles affecting both L4 nerve roots. - 2 mm slip L4-5 associated with a central and leftward protrusion also with posterior element hypertrophy and short pedicles. Moderate to severe stenosis along with LEFT greater than RIGHT L5 nerve root impingement.  - No recurrent leftward protrusion at the L5-S1 level, however a central and rightward protrusion at L5-S1 could affect the RIGHT S1 nerve root.  12/23/16 CT head [I reviewed images myself and agree with interpretation. -VRP]  - Mild chronic ischemic white matter disease. No acute intracranial abnormality seen.  12/23/16 CT cervical [I reviewed images myself and agree with interpretation. -VRP]  - Postsurgical and degenerative changes as described above. No acute abnormality seen in the cervical spine.  01/23/17 MRI of the brain without contrast shows the following: 1.     Moderate cortical  atrophy, more pronounced in the mesial temporal lobes, which has significantly progressed when compared to the 01/28/2012 MRI. 2.    Mild to moderate chronic microvascular ischemic changes, mildly progressed when compared to the previous MRI. 3.    There are no acute findings.  01/23/17 MRI of the cervical spine without contrast shows the following: 1.    Since the MRI of the cervical spine dated 05/09/2013, the patient has undergone C3-C4 ACDF with resolution of the spinal stenosis noted at that time. 2.    There  are multilevel degenerative changes resulting in mild spinal stenosis at C4-C5. Our various degrees of mild-to-moderate foraminal narrowing as detailed above that did not lead to nerve root compression. 3.    The spinal cord appears normal. 4.    There are no acute findings.     ASSESSMENT AND PLAN  72 y.o. year old male here with progressive memory problems, short-term recall issues, cognitive difficulty, balance and walking problems, tremor. Also with signs and symptoms of parkinsonism. Other contributing factors could include depression, chronic pain, degenerative spine disease, aging and deconditioning.  Ddx memory loss: depression, chronic pain, life stress, neurodegenerative   Ddx balance difficulty: parkinsonism, cervical myelopathy, lumbar radiculopathy  1. Parkinsonism, unspecified Parkinsonism type ([place])   2. Gait difficulty   3. Memory loss     PLAN:  PARKINSON'S DISESASE - IMPROVED; continue carb/levo 1 tab three times a day  - continue PT exercises - fall precautions reviewed  MEMORY LOSS  - stable; monitor  DEPRESSION / ANXIETY - improved; continue treatments per psychiatry  MARITAL STRAIN - improved  Return in about 1 year (around 09/28/2019).    Penni Bombard, MD 81/07/2992, 7:16 AM Certified in Neurology, Neurophysiology and Neuroimaging  [facility] [address] [place], [place] [address] [phone]

## 2018-10-03 ENCOUNTER — Encounter (HOSPITAL_COMMUNITY): Admission: RE | Admit: 2018-10-03 | Payer: PPO | Source: Ambulatory Visit

## 2018-10-03 ENCOUNTER — Encounter (HOSPITAL_COMMUNITY): Payer: PPO

## 2018-10-04 ENCOUNTER — Ambulatory Visit: Payer: PPO | Admitting: "Endocrinology

## 2018-10-04 ENCOUNTER — Encounter (HOSPITAL_COMMUNITY): Payer: PPO

## 2018-10-19 ENCOUNTER — Other Ambulatory Visit: Payer: Self-pay | Admitting: Diagnostic Neuroimaging

## 2018-10-20 DIAGNOSIS — E782 Mixed hyperlipidemia: Secondary | ICD-10-CM | POA: Diagnosis not present

## 2018-10-20 DIAGNOSIS — E1165 Type 2 diabetes mellitus with hyperglycemia: Secondary | ICD-10-CM | POA: Diagnosis not present

## 2018-10-20 DIAGNOSIS — R7301 Impaired fasting glucose: Secondary | ICD-10-CM | POA: Diagnosis not present

## 2018-10-20 DIAGNOSIS — I10 Essential (primary) hypertension: Secondary | ICD-10-CM | POA: Diagnosis not present

## 2018-10-24 ENCOUNTER — Telehealth (HOSPITAL_COMMUNITY): Payer: Self-pay | Admitting: *Deleted

## 2018-10-24 ENCOUNTER — Other Ambulatory Visit (HOSPITAL_COMMUNITY): Payer: Self-pay | Admitting: Psychiatry

## 2018-10-24 MED ORDER — DULOXETINE HCL 60 MG PO CPEP: ORAL_CAPSULE | ORAL | 3 refills | Status: DC

## 2018-10-24 MED ORDER — DIAZEPAM 5 MG PO TABS: 5.0000 mg | ORAL_TABLET | Freq: Two times a day (BID) | ORAL | 2 refills | Status: DC | PRN

## 2018-10-24 NOTE — Telephone Encounter (Signed)
sent 

## 2018-10-24 NOTE — Telephone Encounter (Signed)
Dr Harrington Challenger Amy from [facility] called they will be now managing the patient's medication needs. And requested a new script be sent for his Cymbalta & Diazepam. I sent copies ( requested ) of his med list.

## 2018-10-25 ENCOUNTER — Telehealth: Payer: Self-pay | Admitting: Diagnostic Neuroimaging

## 2018-10-25 MED ORDER — CARBIDOPA-LEVODOPA 25-100 MG PO TABS: ORAL_TABLET | ORAL | 4 refills | Status: DC

## 2018-10-25 NOTE — Addendum Note (Signed)
Addended by: Florian Buff C on: 10/25/2018 10:02 AM   Modules accepted: Orders

## 2018-10-25 NOTE — Telephone Encounter (Signed)
Amy with Dr. [name] office is calling requesting a written Rx for carbidopa-levodopa (SINEMET IR) 25-100 MG tablet to be faxed to them for patient. She states that office will do his refills. [phone].

## 2018-10-25 NOTE — Telephone Encounter (Addendum)
Called wife to confirm the request re: Carbi-levo Rx to PCP. She stated the PCP is trying to get medications into pill pack, so she would like Rx faxed to [name]. This RN LVM at [facility] advising the previous Rx sent on 10/19/18 be discontinued. New Rx printed, ready for Dr [facility] signature.

## 2018-10-25 NOTE — Telephone Encounter (Signed)
Carb-Levo Rx faxed to [name], PCP.

## 2018-11-08 DIAGNOSIS — I10 Essential (primary) hypertension: Secondary | ICD-10-CM | POA: Diagnosis not present

## 2018-11-08 DIAGNOSIS — R946 Abnormal results of thyroid function studies: Secondary | ICD-10-CM | POA: Diagnosis not present

## 2018-11-08 DIAGNOSIS — E1165 Type 2 diabetes mellitus with hyperglycemia: Secondary | ICD-10-CM | POA: Diagnosis not present

## 2018-11-08 DIAGNOSIS — E782 Mixed hyperlipidemia: Secondary | ICD-10-CM | POA: Diagnosis not present

## 2018-11-14 ENCOUNTER — Ambulatory Visit (HOSPITAL_COMMUNITY): Payer: Self-pay | Admitting: Psychiatry

## 2018-11-15 DIAGNOSIS — Z0000 Encounter for general adult medical examination without abnormal findings: Secondary | ICD-10-CM | POA: Diagnosis not present

## 2018-11-15 DIAGNOSIS — G20 Parkinson's disease: Secondary | ICD-10-CM | POA: Diagnosis not present

## 2018-11-15 DIAGNOSIS — E0590 Thyrotoxicosis, unspecified without thyrotoxic crisis or storm: Secondary | ICD-10-CM | POA: Diagnosis not present

## 2018-11-15 DIAGNOSIS — R000 Tachycardia, unspecified: Secondary | ICD-10-CM | POA: Diagnosis not present

## 2018-11-15 DIAGNOSIS — Z23 Encounter for immunization: Secondary | ICD-10-CM | POA: Diagnosis not present

## 2018-12-05 DIAGNOSIS — I10 Essential (primary) hypertension: Secondary | ICD-10-CM | POA: Diagnosis not present

## 2018-12-05 DIAGNOSIS — E782 Mixed hyperlipidemia: Secondary | ICD-10-CM | POA: Diagnosis not present

## 2018-12-29 DIAGNOSIS — F339 Major depressive disorder, recurrent, unspecified: Secondary | ICD-10-CM | POA: Diagnosis not present

## 2018-12-29 DIAGNOSIS — E1165 Type 2 diabetes mellitus with hyperglycemia: Secondary | ICD-10-CM | POA: Diagnosis not present

## 2018-12-29 DIAGNOSIS — G3184 Mild cognitive impairment, so stated: Secondary | ICD-10-CM | POA: Diagnosis not present

## 2018-12-29 DIAGNOSIS — I10 Essential (primary) hypertension: Secondary | ICD-10-CM | POA: Diagnosis not present

## 2018-12-29 DIAGNOSIS — G629 Polyneuropathy, unspecified: Secondary | ICD-10-CM | POA: Diagnosis not present

## 2018-12-29 DIAGNOSIS — G20 Parkinson's disease: Secondary | ICD-10-CM | POA: Diagnosis not present

## 2018-12-29 DIAGNOSIS — E0590 Thyrotoxicosis, unspecified without thyrotoxic crisis or storm: Secondary | ICD-10-CM | POA: Diagnosis not present

## 2018-12-29 DIAGNOSIS — R7301 Impaired fasting glucose: Secondary | ICD-10-CM | POA: Diagnosis not present

## 2018-12-29 DIAGNOSIS — E119 Type 2 diabetes mellitus without complications: Secondary | ICD-10-CM | POA: Diagnosis not present

## 2018-12-29 DIAGNOSIS — R946 Abnormal results of thyroid function studies: Secondary | ICD-10-CM | POA: Diagnosis not present

## 2018-12-29 DIAGNOSIS — Z96651 Presence of right artificial knee joint: Secondary | ICD-10-CM | POA: Diagnosis not present

## 2018-12-29 DIAGNOSIS — Z9889 Other specified postprocedural states: Secondary | ICD-10-CM | POA: Diagnosis not present

## 2018-12-29 DIAGNOSIS — D508 Other iron deficiency anemias: Secondary | ICD-10-CM | POA: Diagnosis not present

## 2018-12-29 DIAGNOSIS — E782 Mixed hyperlipidemia: Secondary | ICD-10-CM | POA: Diagnosis not present

## 2018-12-29 DIAGNOSIS — F419 Anxiety disorder, unspecified: Secondary | ICD-10-CM | POA: Diagnosis not present

## 2019-01-02 ENCOUNTER — Ambulatory Visit (HOSPITAL_COMMUNITY): Payer: PPO | Admitting: Psychiatry

## 2019-02-09 DIAGNOSIS — J060 Acute laryngopharyngitis: Secondary | ICD-10-CM | POA: Diagnosis not present

## 2019-02-17 DIAGNOSIS — E782 Mixed hyperlipidemia: Secondary | ICD-10-CM | POA: Diagnosis not present

## 2019-02-17 DIAGNOSIS — I10 Essential (primary) hypertension: Secondary | ICD-10-CM | POA: Diagnosis not present

## 2019-02-20 ENCOUNTER — Ambulatory Visit: Payer: PPO | Admitting: Orthopedic Surgery

## 2019-03-07 DIAGNOSIS — E1165 Type 2 diabetes mellitus with hyperglycemia: Secondary | ICD-10-CM | POA: Diagnosis not present

## 2019-03-07 DIAGNOSIS — E782 Mixed hyperlipidemia: Secondary | ICD-10-CM | POA: Diagnosis not present

## 2019-03-07 DIAGNOSIS — R7301 Impaired fasting glucose: Secondary | ICD-10-CM | POA: Diagnosis not present

## 2019-03-07 DIAGNOSIS — R946 Abnormal results of thyroid function studies: Secondary | ICD-10-CM | POA: Diagnosis not present

## 2019-03-07 DIAGNOSIS — I10 Essential (primary) hypertension: Secondary | ICD-10-CM | POA: Diagnosis not present

## 2019-03-13 ENCOUNTER — Encounter: Payer: Self-pay | Admitting: Orthopedic Surgery

## 2019-03-13 ENCOUNTER — Other Ambulatory Visit: Payer: Self-pay

## 2019-03-13 ENCOUNTER — Ambulatory Visit (INDEPENDENT_AMBULATORY_CARE_PROVIDER_SITE_OTHER): Payer: PPO

## 2019-03-13 ENCOUNTER — Ambulatory Visit: Payer: PPO | Admitting: Orthopedic Surgery

## 2019-03-13 VITALS — BP 135/76 | HR 80 | Ht 73.0 in | Wt 225.0 lb

## 2019-03-13 DIAGNOSIS — Z96652 Presence of left artificial knee joint: Secondary | ICD-10-CM | POA: Diagnosis not present

## 2019-03-13 NOTE — Progress Notes (Signed)
ANNUAL FOLLOW UP FOR  Left  TKA   Chief Complaint  Patient presents with  . Follow-up    Recheck on left knee replacement, DOS 02-01-18.     HPI: The patient is here for the annual  follow-up x-ray for knee replacement. The patient is complaining of anterolateral knee pain but no weakness instability or stiffness in the repaired knee.   Review of Systems  Constitutional: Negative for fever.  Respiratory: Negative for shortness of breath.     Past Medical History:  Diagnosis Date  . Anxiety    takes Valium as needed  . Arthritis   . Bladder cancer ([place])    takes Rapaflo daily  . Blood dyscrasia    07/08/16: pt told he was a "free bleeder" after bleeding a lot when derm cut off mole on forehead. No excessive bleeding with minor wounds at home, no bleeding problems perioperatively with prior surgeries.  . Chronic back pain    spondylolisthesis  . Cluster headaches   . Depression    takes Lexapro daily  . Essential hypertension, benign    takes Diovan-HCT daily  . Hx of complications due to general anesthesia    Aborted surgery 07/15/2016 due to hypotension per pt  . Joint pain   . Obstructive sleep apnea on CPAP    uses CPAP @ night  . Parkinsonism ([place])   . Pneumonia    "several times"--last time about 3-4 yrs ago  . Prediabetes      Examination of the left KNEE  BP 109/67   Pulse 95   Ht 6\' 1"  (1.854 m)   Wt 225 lb (102.1 kg)   BMI 29.69 kg/m   General the patient is normally groomed in no distress  Mood normal Affect pleasant   The patient is Awake and alert ; oriented normal   Inspection shows : incision healed nicely without erythema, mild tenderness in the anterolateral patella   range of motion total range of motion is 3-115  Stability the knee is stable anterior to posterior as well as medial to lateral  Strength quadriceps strength is normal  Skin no erythema around the skin incision  Cardiovascular NO EDEMA   Neuro: normal sensation in the  operative leg   Gait: normal expected gait without cane    Medical decision-making section  X-rays ordered with the following personal interpretation  Normal alignment without loosening, we do see some tilting in the patella which may explain his symptoms  Diagnosis  Encounter Diagnosis  Name Primary?  . History of total left knee replacement Yes   Recommend Biofreeze 3 times a day  Plan follow-up 1 year repeat x-rays

## 2019-03-13 NOTE — Addendum Note (Signed)
Addended byCandice Camp on: 03/13/2019 10:20 AM   Modules accepted: Orders

## 2019-03-14 DIAGNOSIS — G4733 Obstructive sleep apnea (adult) (pediatric): Secondary | ICD-10-CM | POA: Diagnosis not present

## 2019-03-29 DIAGNOSIS — N200 Calculus of kidney: Secondary | ICD-10-CM

## 2019-03-29 HISTORY — DX: Calculus of kidney: N20.0

## 2019-04-06 DIAGNOSIS — F339 Major depressive disorder, recurrent, unspecified: Secondary | ICD-10-CM | POA: Diagnosis not present

## 2019-04-06 DIAGNOSIS — G3184 Mild cognitive impairment, so stated: Secondary | ICD-10-CM | POA: Diagnosis not present

## 2019-04-06 DIAGNOSIS — R311 Benign essential microscopic hematuria: Secondary | ICD-10-CM | POA: Diagnosis not present

## 2019-04-06 DIAGNOSIS — E1165 Type 2 diabetes mellitus with hyperglycemia: Secondary | ICD-10-CM | POA: Diagnosis not present

## 2019-04-06 DIAGNOSIS — R42 Dizziness and giddiness: Secondary | ICD-10-CM | POA: Diagnosis not present

## 2019-04-06 DIAGNOSIS — J019 Acute sinusitis, unspecified: Secondary | ICD-10-CM | POA: Diagnosis not present

## 2019-04-06 DIAGNOSIS — D508 Other iron deficiency anemias: Secondary | ICD-10-CM | POA: Diagnosis not present

## 2019-04-06 DIAGNOSIS — N200 Calculus of kidney: Secondary | ICD-10-CM | POA: Diagnosis not present

## 2019-04-06 DIAGNOSIS — N3941 Urge incontinence: Secondary | ICD-10-CM | POA: Diagnosis not present

## 2019-04-06 DIAGNOSIS — H832X2 Labyrinthine dysfunction, left ear: Secondary | ICD-10-CM | POA: Diagnosis not present

## 2019-04-06 DIAGNOSIS — I10 Essential (primary) hypertension: Secondary | ICD-10-CM | POA: Diagnosis not present

## 2019-04-06 DIAGNOSIS — R946 Abnormal results of thyroid function studies: Secondary | ICD-10-CM | POA: Diagnosis not present

## 2019-04-06 DIAGNOSIS — Z9889 Other specified postprocedural states: Secondary | ICD-10-CM | POA: Diagnosis not present

## 2019-04-06 DIAGNOSIS — E782 Mixed hyperlipidemia: Secondary | ICD-10-CM | POA: Diagnosis not present

## 2019-04-06 DIAGNOSIS — F419 Anxiety disorder, unspecified: Secondary | ICD-10-CM | POA: Diagnosis not present

## 2019-04-06 DIAGNOSIS — E119 Type 2 diabetes mellitus without complications: Secondary | ICD-10-CM | POA: Diagnosis not present

## 2019-04-06 DIAGNOSIS — G20 Parkinson's disease: Secondary | ICD-10-CM | POA: Diagnosis not present

## 2019-04-12 DIAGNOSIS — E1165 Type 2 diabetes mellitus with hyperglycemia: Secondary | ICD-10-CM | POA: Diagnosis not present

## 2019-04-12 DIAGNOSIS — I10 Essential (primary) hypertension: Secondary | ICD-10-CM | POA: Diagnosis not present

## 2019-04-12 DIAGNOSIS — R7301 Impaired fasting glucose: Secondary | ICD-10-CM | POA: Diagnosis not present

## 2019-04-12 DIAGNOSIS — R946 Abnormal results of thyroid function studies: Secondary | ICD-10-CM | POA: Diagnosis not present

## 2019-04-12 DIAGNOSIS — E782 Mixed hyperlipidemia: Secondary | ICD-10-CM | POA: Diagnosis not present

## 2019-04-24 ENCOUNTER — Emergency Department (HOSPITAL_COMMUNITY)
Admission: EM | Admit: 2019-04-24 | Discharge: 2019-04-24 | Disposition: A | Discharge status: Home / Self Care | Payer: PPO | Attending: Emergency Medicine | Admitting: Emergency Medicine

## 2019-04-24 ENCOUNTER — Emergency Department (HOSPITAL_COMMUNITY): Payer: PPO

## 2019-04-24 ENCOUNTER — Encounter (HOSPITAL_COMMUNITY): Payer: Self-pay

## 2019-04-24 ENCOUNTER — Other Ambulatory Visit: Payer: Self-pay

## 2019-04-24 DIAGNOSIS — G20 Parkinson's disease: Secondary | ICD-10-CM | POA: Diagnosis not present

## 2019-04-24 DIAGNOSIS — Z79899 Other long term (current) drug therapy: Secondary | ICD-10-CM | POA: Insufficient documentation

## 2019-04-24 DIAGNOSIS — N201 Calculus of ureter: Secondary | ICD-10-CM | POA: Insufficient documentation

## 2019-04-24 DIAGNOSIS — Z87891 Personal history of nicotine dependence: Secondary | ICD-10-CM | POA: Insufficient documentation

## 2019-04-24 DIAGNOSIS — Z8551 Personal history of malignant neoplasm of bladder: Secondary | ICD-10-CM | POA: Diagnosis not present

## 2019-04-24 DIAGNOSIS — R109 Unspecified abdominal pain: Secondary | ICD-10-CM | POA: Diagnosis present

## 2019-04-24 DIAGNOSIS — N132 Hydronephrosis with renal and ureteral calculous obstruction: Secondary | ICD-10-CM | POA: Diagnosis not present

## 2019-04-24 DIAGNOSIS — I10 Essential (primary) hypertension: Secondary | ICD-10-CM | POA: Insufficient documentation

## 2019-04-24 DIAGNOSIS — Z96652 Presence of left artificial knee joint: Secondary | ICD-10-CM | POA: Insufficient documentation

## 2019-04-24 LAB — CBC WITH DIFFERENTIAL/PLATELET
Abs Immature Granulocytes: 0.02 10*3/uL (ref 0.00–0.07)
Basophils Absolute: 0.1 10*3/uL (ref 0.0–0.1)
Basophils Relative: 1 %
Eosinophils Absolute: 0.1 10*3/uL (ref 0.0–0.5)
Eosinophils Relative: 1 %
HCT: 43.3 % (ref 39.0–52.0)
Hemoglobin: 14.2 g/dL (ref 13.0–17.0)
Immature Granulocytes: 0 %
Lymphocytes Relative: 28 %
Lymphs Abs: 2.5 10*3/uL (ref 0.7–4.0)
MCH: 30.5 pg (ref 26.0–34.0)
MCHC: 32.8 g/dL (ref 30.0–36.0)
MCV: 93.1 fL (ref 80.0–100.0)
Monocytes Absolute: 0.6 10*3/uL (ref 0.1–1.0)
Monocytes Relative: 6 %
Neutro Abs: 5.6 10*3/uL (ref 1.7–7.7)
Neutrophils Relative %: 64 %
Platelets: 220 10*3/uL (ref 150–400)
RBC: 4.65 MIL/uL (ref 4.22–5.81)
RDW: 14 % (ref 11.5–15.5)
WBC: 8.8 10*3/uL (ref 4.0–10.5)
nRBC: 0 % (ref 0.0–0.2)

## 2019-04-24 LAB — BASIC METABOLIC PANEL
Anion gap: 10 (ref 5–15)
BUN: 20 mg/dL (ref 8–23)
CO2: 25 mmol/L (ref 22–32)
Calcium: 8.8 mg/dL — ABNORMAL LOW (ref 8.9–10.3)
Chloride: 105 mmol/L (ref 98–111)
Creatinine, Ser: 1.22 mg/dL (ref 0.61–1.24)
GFR calc Af Amer: >60 mL/min (ref >60)
GFR calc non Af Amer: 59 mL/min — ABNORMAL LOW (ref >60)
Glucose, Bld: 99 mg/dL (ref 70–99)
Potassium: 3.6 mmol/L (ref 3.5–5.1)
Sodium: 140 mmol/L (ref 135–145)

## 2019-04-24 LAB — URINALYSIS, ROUTINE W REFLEX MICROSCOPIC
Bilirubin Urine: NEGATIVE
Glucose, UA: NEGATIVE mg/dL
Ketones, ur: NEGATIVE mg/dL
Leukocytes,Ua: NEGATIVE
Nitrite: NEGATIVE
Protein, ur: 30 mg/dL — AB
RBC / HPF: >50 RBC/hpf — ABNORMAL HIGH (ref 0–5)
Specific Gravity, Urine: 1.023 (ref 1.005–1.030)
pH: 5 (ref 5.0–8.0)

## 2019-04-24 MED ORDER — CEPHALEXIN 500 MG PO CAPS: 500.0000 mg | ORAL_CAPSULE | Freq: Four times a day (QID) | ORAL | 0 refills | Status: DC

## 2019-04-24 MED ORDER — HYDROMORPHONE HCL 1 MG/ML IJ SOLN
0.5000 mg | Freq: Once | INTRAMUSCULAR | Status: AC
  Administered 2019-04-24: 0.5 mg via INTRAVENOUS
  Filled 2019-04-24: qty 1

## 2019-04-24 MED ORDER — OXYCODONE-ACETAMINOPHEN 5-325 MG PO TABS: 2.0000 | ORAL_TABLET | ORAL | 0 refills | Status: DC | PRN

## 2019-04-24 MED ORDER — OXYCODONE-ACETAMINOPHEN 5-325 MG PO TABS: 1.0000 | ORAL_TABLET | ORAL | 0 refills | Status: DC | PRN

## 2019-04-24 MED ORDER — KETOROLAC TROMETHAMINE 30 MG/ML IJ SOLN
30.0000 mg | Freq: Once | INTRAMUSCULAR | Status: AC
  Administered 2019-04-24: 30 mg via INTRAVENOUS
  Filled 2019-04-24: qty 1

## 2019-04-24 MED ORDER — ONDANSETRON HCL 4 MG/2ML IJ SOLN
4.0000 mg | Freq: Once | INTRAMUSCULAR | Status: AC
  Administered 2019-04-24: 4 mg via INTRAVENOUS
  Filled 2019-04-24: qty 2

## 2019-04-24 MED ORDER — HYDROMORPHONE HCL 1 MG/ML IJ SOLN
1.0000 mg | Freq: Once | INTRAMUSCULAR | Status: AC
  Administered 2019-04-24: 1 mg via INTRAVENOUS
  Filled 2019-04-24: qty 1

## 2019-04-24 MED ORDER — ONDANSETRON HCL 4 MG PO TABS: 4.0000 mg | ORAL_TABLET | Freq: Four times a day (QID) | ORAL | 0 refills | Status: DC

## 2019-04-24 MED ORDER — TAMSULOSIN HCL 0.4 MG PO CAPS: 0.4000 mg | ORAL_CAPSULE | Freq: Every day | ORAL | 0 refills | Status: DC

## 2019-04-24 NOTE — ED Provider Notes (Signed)
[facility] EMERGENCY DEPARTMENT Provider Note   CSN: [id number] Arrival date & time: 04/24/19  1925    History   Chief Complaint Chief Complaint  Patient presents with  . Flank Pain    left    HPI Zachary [name] is a 73 y.o. male.     HPI   Zachary [name] is a 73 y.o. male who presents to the Emergency Department complaining of sudden onset of left flank pain this afternoon.  Describes pain as sharp and similar to previous kidney stones.  He notes some cloudy urine for a few days.  Pain has been associated with nausea and "dry heaves" w/o actual vomiting.  He denies fever, chills, chest pain, dyspnea, and pain to his testicle or groin.  States last kidney stone was > 10 years ago and he underwent lithotripsy.    Past Medical History:  Diagnosis Date  . Anxiety    takes Valium as needed  . Arthritis   . Bladder cancer ([place])    takes Rapaflo daily  . Blood dyscrasia    07/08/16: pt told he was a "free bleeder" after bleeding a lot when derm cut off mole on forehead. No excessive bleeding with minor wounds at home, no bleeding problems perioperatively with prior surgeries.  . Chronic back pain    spondylolisthesis  . Cluster headaches   . Depression    takes Lexapro daily  . Essential hypertension, benign    takes Diovan-HCT daily  . Hx of complications due to general anesthesia    Aborted surgery 07/15/2016 due to hypotension per pt  . Joint pain   . Obstructive sleep apnea on CPAP    uses CPAP @ night  . Parkinsonism ([place])   . Pneumonia    "several times"--last time about 3-4 yrs ago  . Prediabetes     Patient Active Problem List   Diagnosis Date Noted  . Subclinical hyperthyroidism 09/20/2018  . Prediabetes 09/20/2018  . S/P total knee replacement, 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 02/07/2018  . Primary osteoarthritis of left knee 02/01/2018  . Postural hypotension 07/23/2016  . Dysphagia 07/23/2016  . Obstructive sleep apnea 07/21/2016  . Hyperglycemia 07/21/2016  . Anemia,  unspecified 07/21/2016  . Spinal stenosis of lumbar region 07/15/2016  . Bladder tumor 07/27/2015  . Epiretinal membrane (ERM) of left eye 03/14/2014  . H/O retinal detachment 03/14/2014  . Pseudophakia of both eyes 03/14/2014  . Retinal detachment 03/14/2014  . Visual distortion 03/14/2014  . HNP (herniated nucleus pulposus), cervical 06/05/2013    Class: Diagnosis of  . 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  . Muscle spasms of neck 04/04/2013  . 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 OA (osteoarthritis) of knee 02/22/2013  . Iliotibial band syndrome of left side 11/22/2012  . Localized, primary osteoarthritis of the ankle and foot 10/11/2012  . Difficulty in walking(719.7) 08/17/2012  . Peroneal tendinitis 08/16/2012  . Precordial pain 02/09/2012  . Essential hypertension, benign 02/09/2012  . Mixed hyperlipidemia 02/09/2012  . Dysthymia 02/04/2012  . Abnormality of gait 12/23/2011  . Lateral epicondylitis/tennis elbow 05/06/2011  . TENOSYNOVITIS OF FOOT AND ANKLE 10/22/2010    Past Surgical History:  Procedure Laterality Date  . ANTERIOR CERVICAL DECOMP/DISCECTOMY FUSION N/A 06/05/2013   Procedure:  C3-4 Anterior Cervical Discectomy and Fusion, Allograft, Plate;  Surgeon: [name], MD;  Location: [place];  Service: Orthopedics;  Laterality: N/A;  C3-4 Anterior Cervical Discectomy and Fusion, Allograft, Plate  . Arthroscopic knee surgery    .  BACK SURGERY      x 2  . BLADDER SURGERY     x 5 to remove tumor  . CARPAL TUNNEL RELEASE    . CATARACT EXTRACTION W/PHACO  12/19/2012   Procedure: CATARACT EXTRACTION PHACO AND INTRAOCULAR LENS PLACEMENT (IOC);  Surgeon: [name], MD;  Location: [facility];  Service: Ophthalmology;  Laterality: Left;  CDE: 26.80  . CATARACT EXTRACTION W/PHACO  01/09/2013   Procedure: CATARACT EXTRACTION PHACO AND INTRAOCULAR LENS PLACEMENT (IOC);  Surgeon: [name], MD;  Location: [facility];  Service: Ophthalmology;  Laterality: Right;  CDE:22.83   . CERVICAL FUSION  06/05/2013   C 3  C4  . CYSTOSCOPY    . REFRACTIVE SURGERY     Hx: of  . RETINAL DETACHMENT SURGERY    . TOTAL KNEE ARTHROPLASTY Left 02/01/2018   Procedure: TOTAL KNEE ARTHROPLASTY;  Surgeon: Carole Civil, MD;  Location: [facility];  Service: Orthopedics;  Laterality: Left;  . TRANSURETHRAL RESECTION OF BLADDER TUMOR Right 07/27/2015   Procedure: TRANSURETHRAL RESECTION OF BLADDER TUMOR (TURBT);  Surgeon: [name], MD;  Location: [facility];  Service: Urology;  Laterality: Right;  . wisdom tooth extraction          Home Medications    Prior to Admission medications   Medication Sig Start Date End Date Taking? Authorizing Provider  acetaminophen (TYLENOL) 500 MG tablet Take 1,000 mg by mouth at bedtime as needed (for pain.).    Yes [provider]  carbidopa-levodopa (SINEMET IR) 25-100 MG tablet TAKE 1 TABLET BY MOUTH THREE TIMES DAILY BEFORE MEALS Patient taking differently: Take 1 tablet by mouth 3 (three) times daily before meals.  10/25/18  Yes Penumalli, Earlean Polka, MD  diazepam (VALIUM) 5 MG tablet Take 1 tablet (5 mg total) by mouth 2 (two) times daily as needed. for anxiety 10/24/18  Yes Cloria Spring, MD  DULoxetine (CYMBALTA) 60 MG capsule TAKE 1 CAPSULE(60 MG) BY MOUTH DAILY Patient taking differently: Take 60 mg by mouth daily.  10/24/18  Yes Cloria Spring, MD  hydrochlorothiazide (HYDRODIURIL) 25 MG tablet Take 25 mg by mouth daily.  03/07/19  Yes [provider]  methimazole (TAPAZOLE) 5 MG tablet Take 5 mg by mouth every morning.  02/28/19  Yes [provider]  valsartan (DIOVAN) 320 MG tablet Take 320 mg by mouth daily.  03/06/19  Yes [provider]    Family History Family History  Problem Relation Age of Onset  . Hypertension Father   . Cancer Father        Prostate  . Depression Mother   . Hypertension Mother   . Alcohol abuse Mother   . COPD Mother        passed 07-08-17  . Hypertension Sister    . Anxiety disorder Sister   . Alcohol abuse Maternal Grandfather   . ADD / ADHD Neg Hx   . Bipolar disorder Neg Hx   . Dementia Neg Hx   . Drug abuse Neg Hx   . OCD Neg Hx   . Paranoid behavior Neg Hx   . Schizophrenia Neg Hx   . Seizures Neg Hx   . Sexual abuse Neg Hx   . Physical abuse Neg Hx     Social History Social History   Tobacco Use  . Smoking status: Former Smoker    Last attempt to quit: 12/30/1976    Years since quitting: 42.3  . Smokeless tobacco: Never Used  . Tobacco comment: 9622  Substance Use Topics  . Alcohol use: No    Comment: quit 1990  . Drug use: No     Allergies   Sulfonamide derivatives   Review of Systems Review of Systems  Constitutional: Negative for activity change, appetite change, chills and fever.  Respiratory: Negative for chest tightness and shortness of breath.   Cardiovascular: Negative for chest pain.  Gastrointestinal: Positive for nausea. Negative for abdominal pain and vomiting.  Genitourinary: Positive for dysuria and flank pain. Negative for decreased urine volume, difficulty urinating, frequency, hematuria, penile pain and testicular pain.  Musculoskeletal: Negative for back pain.  Skin: Negative for rash.  Neurological: Negative for dizziness, weakness and numbness.  Hematological: Negative for adenopathy.  Psychiatric/Behavioral: Negative for confusion.     Physical Exam Updated Vital Signs BP (!) 143/78   Pulse 62   Temp 97.6 F (36.4 C) (Oral)   Resp 20   Ht 6\' 1"  (1.854 m)   Wt 117.9 kg   SpO2 98%   BMI 34.30 kg/m   Physical Exam Vitals signs and nursing note reviewed.  Constitutional:      Appearance: He is not toxic-appearing.     Comments: Uncomfortable appearing  HENT:     Mouth/Throat:     Mouth: Mucous membranes are moist.  Neck:     Musculoskeletal: Normal range of motion.  Cardiovascular:     Rate and Rhythm: Normal rate and regular rhythm.     Pulses: Normal pulses.  Pulmonary:      Effort: Pulmonary effort is normal.     Breath sounds: Normal breath sounds.  Abdominal:     General: There is no distension.     Palpations: Abdomen is soft. There is no mass.     Tenderness: There is no abdominal tenderness. There is no right CVA tenderness or guarding.     Comments: Pt points to area of left flank as source of pain, no CVA tenderness.    Musculoskeletal: Normal range of motion.  Skin:    General: Skin is warm.     Findings: No rash.  Neurological:     General: No focal deficit present.     Mental Status: He is alert.     Sensory: No sensory deficit.     Motor: No weakness.      ED Treatments / Results  Labs (all labs ordered are listed, but only abnormal results are displayed) Labs Reviewed  BASIC METABOLIC PANEL - Abnormal; Notable for the following components:      Result Value   Calcium 8.8 (*)    GFR calc non Af Amer 59 (*)    All other components within normal limits  URINALYSIS, ROUTINE W REFLEX MICROSCOPIC - Abnormal; Notable for the following components:   APPearance CLOUDY (*)    Hgb urine dipstick LARGE (*)    Protein, ur 30 (*)    RBC / HPF >50 (*)    Bacteria, UA FEW (*)    All other components within normal limits  URINE CULTURE  CBC WITH DIFFERENTIAL/PLATELET    EKG None  Radiology Ct Renal Stone Study  Result Date: 04/24/2019 CLINICAL DATA:  73 year old male with left-sided flank pain EXAM: CT ABDOMEN AND PELVIS WITHOUT CONTRAST TECHNIQUE: Multidetector CT imaging of the abdomen and pelvis was performed following the standard protocol without IV contrast. COMPARISON:  12/23/2016 FINDINGS: Lower chest: Atelectasis at the lung bases. Hepatobiliary: Diffusely decreased attenuation of liver parenchyma. Unremarkable gallbladder. Pancreas: Unremarkable pancreas Spleen: Unremarkable Adrenals/Urinary Tract: Bilateral adrenal  glands unremarkable. Right kidney without hydronephrosis. There are 2 nonobstructing stones in the right collecting  system larger measuring 8 mm. Nonspecific low-density lesion on the anterior cortex. Nonspecific low-density lesion on the lateral cortex at the apex. Mild left-sided hydronephrosis with mild stranding in the hilum of the left kidney. There is an obstructing 4 mm stone in the distal left ureter just above the ureterovesical junction. Additional small nonobstructing stones in the left collecting system largest measuring 9 mm. Urinary bladder partially distended and otherwise unremarkable Stomach/Bowel: Unremarkable stomach. Small hiatal hernia. Unremarkable small bowel. Normal appendix. Colon with mild stool burden. Colonic diverticula with no inflammatory changes. No obstruction. Vascular/Lymphatic: Mild atherosclerosis. No aneurysm. No adenopathy. Reproductive: Unremarkable prostate. Other: Fat containing umbilical hernia, and bilateral fat containing umbilical hernia. Musculoskeletal: No displaced fracture. Degenerative changes of the visualized thoracolumbar spine. Surgical changes of L3-L5. IMPRESSION: Obstructing stone in the distal left ureter just above the ureterovesical junction measuring 4 mm. Mild left-sided hydronephrosis. If there is concern for ascending urinary tract infection recommend correlation with urinalysis. Bilateral nonobstructing nephrolithiasis. Liver steatosis. Diverticular disease. Additional ancillary findings as above. Electronically Signed   By: Corrie Mckusick D.O.   On: 04/24/2019 21:20    Procedures Procedures (including critical care time)  Medications Ordered in ED Medications  ondansetron (ZOFRAN) injection 4 mg (4 mg Intravenous Given 04/24/19 2000)  HYDROmorphone (DILAUDID) injection 0.5 mg (0.5 mg Intravenous Given 04/24/19 2000)  HYDROmorphone (DILAUDID) injection 1 mg (1 mg Intravenous Given 04/24/19 2026)     Initial Impression / Assessment and Plan / ED Course  I have reviewed the triage vital signs and the nursing notes.  Pertinent labs & imaging results that  were available during my care of the patient were reviewed by me and considered in my medical decision making (see chart for details).        Pt with sudden onset of left flank pain today.  Hx of previous kidney stone some years ago.  CT stone study shows obstructing 4 mm stone with hydronephrosis.  U/A indicates possible early developing urinary tract infection, urine cultured and I will start Keflex.    2310  On recheck, pt reports pain has improved and nausea resolved.  He is requesting additional pain medication before d/c home, so I will give one time dose of Tordaol.  Kidney functions wnml.    He appears appropriate for d/c home, he has been seen at [facility] in the past and agrees to arrange close f/u.     Final Clinical Impressions(s) / ED Diagnoses   Final diagnoses:  Left ureteral stone    ED Discharge Orders    None       Bufford Lope 04/24/19 2330    [name], MD 04/25/19 0000

## 2019-04-24 NOTE — ED Notes (Signed)
Gave patient urine strainer and instructed use. Advised not to drive after discharge due to narcotic medication administration. Also advised patient not to drive while taking prescription pain medication. Patient verbalized understanding.

## 2019-04-24 NOTE — ED Notes (Signed)
Patient in room moaning and calling out. States pain has returned. Advised Tammy Triplett PA.

## 2019-04-24 NOTE — Discharge Instructions (Addendum)
Strain all urine.  Your CT scan tonight shows that you have a 61mm kidney stone on the left side.  Call [facility] at the number listed to arrange a follow-up appt.  Return here for any worsening symptoms such as vomiting, fever or increasing pain

## 2019-04-24 NOTE — ED Triage Notes (Signed)
Pt reports left side flank pain that started today with nausea and vomiting. Pt has hx of kidney stone. Pt denies urinary symptoms.

## 2019-04-24 NOTE — ED Notes (Signed)
Gave patient water to drink as requested and approved by PA.

## 2019-04-25 MED FILL — Oxycodone w/ Acetaminophen Tab 5-325 MG: ORAL | Qty: 6 | Status: AC

## 2019-04-26 DIAGNOSIS — C678 Malignant neoplasm of overlapping sites of bladder: Secondary | ICD-10-CM | POA: Diagnosis not present

## 2019-04-26 DIAGNOSIS — B379 Candidiasis, unspecified: Secondary | ICD-10-CM | POA: Diagnosis not present

## 2019-04-26 DIAGNOSIS — N202 Calculus of kidney with calculus of ureter: Secondary | ICD-10-CM | POA: Diagnosis not present

## 2019-04-26 DIAGNOSIS — N132 Hydronephrosis with renal and ureteral calculous obstruction: Secondary | ICD-10-CM | POA: Diagnosis not present

## 2019-04-26 LAB — URINE CULTURE

## 2019-04-28 DIAGNOSIS — E782 Mixed hyperlipidemia: Secondary | ICD-10-CM | POA: Diagnosis not present

## 2019-04-28 DIAGNOSIS — R946 Abnormal results of thyroid function studies: Secondary | ICD-10-CM | POA: Diagnosis not present

## 2019-04-28 DIAGNOSIS — I10 Essential (primary) hypertension: Secondary | ICD-10-CM | POA: Diagnosis not present

## 2019-04-28 DIAGNOSIS — E1165 Type 2 diabetes mellitus with hyperglycemia: Secondary | ICD-10-CM | POA: Diagnosis not present

## 2019-05-02 DIAGNOSIS — Z0000 Encounter for general adult medical examination without abnormal findings: Secondary | ICD-10-CM | POA: Diagnosis not present

## 2019-05-10 ENCOUNTER — Other Ambulatory Visit (HOSPITAL_COMMUNITY): Payer: Self-pay | Admitting: Psychiatry

## 2019-05-10 ENCOUNTER — Telehealth (HOSPITAL_COMMUNITY): Payer: Self-pay | Admitting: *Deleted

## 2019-05-10 MED ORDER — DIAZEPAM 5 MG PO TABS: 5.0000 mg | ORAL_TABLET | Freq: Two times a day (BID) | ORAL | 2 refills | Status: DC | PRN

## 2019-05-10 NOTE — Telephone Encounter (Signed)
Sent but he needs to set up a virtual visit

## 2019-05-10 NOTE — Telephone Encounter (Signed)
Dr Harrington Challenger [facility] called requesting refill on the Valium  5 mg [facility]

## 2019-05-10 NOTE — Telephone Encounter (Signed)
LVM per provider Refill sent but he needs to set up a virtual visit

## 2019-05-12 ENCOUNTER — Ambulatory Visit: Payer: PPO | Admitting: Orthopedic Surgery

## 2019-05-12 DIAGNOSIS — R3914 Feeling of incomplete bladder emptying: Secondary | ICD-10-CM | POA: Diagnosis not present

## 2019-05-12 DIAGNOSIS — C678 Malignant neoplasm of overlapping sites of bladder: Secondary | ICD-10-CM | POA: Diagnosis not present

## 2019-05-12 DIAGNOSIS — N202 Calculus of kidney with calculus of ureter: Secondary | ICD-10-CM | POA: Diagnosis not present

## 2019-06-01 DIAGNOSIS — E1165 Type 2 diabetes mellitus with hyperglycemia: Secondary | ICD-10-CM | POA: Diagnosis not present

## 2019-06-01 DIAGNOSIS — E782 Mixed hyperlipidemia: Secondary | ICD-10-CM | POA: Diagnosis not present

## 2019-06-01 DIAGNOSIS — R946 Abnormal results of thyroid function studies: Secondary | ICD-10-CM | POA: Diagnosis not present

## 2019-06-01 DIAGNOSIS — I10 Essential (primary) hypertension: Secondary | ICD-10-CM | POA: Diagnosis not present

## 2019-06-01 DIAGNOSIS — R7301 Impaired fasting glucose: Secondary | ICD-10-CM | POA: Diagnosis not present

## 2019-06-06 ENCOUNTER — Other Ambulatory Visit: Payer: Self-pay

## 2019-06-06 ENCOUNTER — Ambulatory Visit (INDEPENDENT_AMBULATORY_CARE_PROVIDER_SITE_OTHER): Payer: PPO | Admitting: Psychiatry

## 2019-06-06 ENCOUNTER — Encounter (HOSPITAL_COMMUNITY): Payer: Self-pay | Admitting: Psychiatry

## 2019-06-06 DIAGNOSIS — F331 Major depressive disorder, recurrent, moderate: Secondary | ICD-10-CM

## 2019-06-06 MED ORDER — DULOXETINE HCL 60 MG PO CPEP: 60.0000 mg | ORAL_CAPSULE | Freq: Every day | ORAL | 3 refills | Status: DC

## 2019-06-06 MED ORDER — DIAZEPAM 5 MG PO TABS: 5.0000 mg | ORAL_TABLET | Freq: Every day | ORAL | 5 refills | Status: DC | PRN

## 2019-06-06 NOTE — Progress Notes (Signed)
Virtual Visit via Telephone Note  I connected with Zachary [name] on 06/06/19 at  9:00 AM EDT by telephone and verified that I am speaking with the correct person using two identifiers.   I discussed the limitations, risks, security and privacy concerns of performing an evaluation and management service by telephone and the availability of in person appointments. I also discussed with the patient that there may be a patient responsible charge related to this service. The patient expressed understanding and agreed to proceed.      I discussed the assessment and treatment plan with the patient. The patient was provided an opportunity to ask questions and all were answered. The patient agreed with the plan and demonstrated an understanding of the instructions.   The patient was advised to call back or seek an in-person evaluation if the symptoms worsen or if the condition fails to improve as anticipated.  I provided 15 minutes of non-face-to-face time during this encounter.   Zachary Spiller, MD  [facility] MD/PA/NP OP Progress Note  06/06/2019 9:19 AM Zachary [name]  MRN:  [id number]  Chief Complaint:  Chief Complaint    Depression; Anxiety; Follow-up     HPI: This patient is a 73year-old married white male who lives with his wife in [place] they have no children. 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  The patient states he got depressed several years ago after his diagnosed with stage IV bladder cancer. He was seeing a psychologist here to deal with this and last year was placed on Lexapro. His cancer is gone now after 5 surgeries.  The patient returns after about 11 months.  He states that he is doing extremely well.  He is staying very busy during the coronavirus pandemic.  He has been cleaning out his garage working on his yard helping his sister move.  He  still has knee and back pain but is under good control.  He is sleeping well denies serious anxiety or depression.  He only takes the Valium every day or every other day.  His mood is good and he thinks the Cymbalta is working well for him.  He denies any other symptoms of depression. Visit Diagnosis:    ICD-10-CM   1. Major depressive disorder, recurrent episode, moderate (HCC) F33.1     Past Psychiatric History: Outpatient treatment for depression and anxiety for the last several years secondary to medical issues  Past Medical History:  Past Medical History:  Diagnosis Date  . Anxiety    takes Valium as needed  . Arthritis   . Bladder cancer ([place])    takes Rapaflo daily  . Blood dyscrasia    07/08/16: pt told he was a "free bleeder" after bleeding a lot when derm cut off mole on forehead. No excessive bleeding with minor wounds at home, no bleeding problems perioperatively with prior surgeries.  . Chronic back pain    spondylolisthesis  . Cluster headaches   . Depression    takes Lexapro daily  . Essential hypertension, benign    takes Diovan-HCT daily  . Hx of complications due to general anesthesia    Aborted surgery 07/15/2016 due to hypotension per pt  . Joint pain   . Obstructive sleep apnea on CPAP    uses CPAP @ night  . Parkinsonism ([place])   . Pneumonia    "  several times"--last time about 3-4 yrs ago  . Prediabetes     Past Surgical History:  Procedure Laterality Date  . ANTERIOR CERVICAL DECOMP/DISCECTOMY FUSION N/A 06/05/2013   Procedure:  C3-4 Anterior Cervical Discectomy and Fusion, Allograft, Plate;  Surgeon: [name], MD;  Location: [facility];  Service: Orthopedics;  Laterality: N/A;  C3-4 Anterior Cervical Discectomy and Fusion, Allograft, Plate  . Arthroscopic knee surgery    . BACK SURGERY      x 2  . BLADDER SURGERY     x 5 to remove tumor  . CARPAL TUNNEL RELEASE    . CATARACT EXTRACTION W/PHACO  12/19/2012   Procedure: CATARACT EXTRACTION PHACO AND  INTRAOCULAR LENS PLACEMENT (IOC);  Surgeon: [name], MD;  Location: [facility];  Service: Ophthalmology;  Laterality: Left;  CDE: 26.80  . CATARACT EXTRACTION W/PHACO  01/09/2013   Procedure: CATARACT EXTRACTION PHACO AND INTRAOCULAR LENS PLACEMENT (IOC);  Surgeon: [name], MD;  Location: [facility];  Service: Ophthalmology;  Laterality: Right;  CDE:22.83  . CERVICAL FUSION  06/05/2013   C 3  C4  . CYSTOSCOPY    . REFRACTIVE SURGERY     Hx: of  . RETINAL DETACHMENT SURGERY    . TOTAL KNEE ARTHROPLASTY Left 02/01/2018   Procedure: TOTAL KNEE ARTHROPLASTY;  Surgeon: Carole Civil, MD;  Location: [facility];  Service: Orthopedics;  Laterality: Left;  . TRANSURETHRAL RESECTION OF BLADDER TUMOR Right 07/27/2015   Procedure: TRANSURETHRAL RESECTION OF BLADDER TUMOR (TURBT);  Surgeon: [name], MD;  Location: [facility];  Service: Urology;  Laterality: Right;  . wisdom tooth extraction      Family Psychiatric History: see below  Family History:  Family History  Problem Relation Age of Onset  . Hypertension Father   . Cancer Father        Prostate  . Depression Mother   . Hypertension Mother   . Alcohol abuse Mother   . COPD Mother        passed 07-08-17  . Hypertension Sister   . Anxiety disorder Sister   . Alcohol abuse Maternal Grandfather   . ADD / ADHD Neg Hx   . Bipolar disorder Neg Hx   . Dementia Neg Hx   . Drug abuse Neg Hx   . OCD Neg Hx   . Paranoid behavior Neg Hx   . Schizophrenia Neg Hx   . Seizures Neg Hx   . Sexual abuse Neg Hx   . Physical abuse Neg Hx     Social History:  Social History   Socioeconomic History  . Marital status: Married    Spouse name: Zachary [name]   . Number of children: 0  . Years of education: 23  . Highest education level: Not on file  Occupational History  . Occupation: Retired    Fish farm manager: UNEMPLOYED  Social Needs  . Financial resource strain: Not on file  . Food insecurity:    Worry: Not on file    Inability: Not on file  .  Transportation needs:    Medical: Not on file    Non-medical: Not on file  Tobacco Use  . Smoking status: Former Smoker    Last attempt to quit: 12/30/1976    Years since quitting: 42.4  . Smokeless tobacco: Never Used  . Tobacco comment: 1985  Substance and Sexual Activity  . Alcohol use: No    Comment: quit 1990  . Drug use: No  . Sexual activity: Yes  Lifestyle  . Physical  activity:    Days per week: Not on file    Minutes per session: Not on file  . Stress: Not on file  Relationships  . Social connections:    Talks on phone: Not on file    Gets together: Not on file    Attends religious service: Not on file    Active member of club or organization: Not on file    Attends meetings of clubs or organizations: Not on file    Relationship status: Not on file  Other Topics Concern  . Not on file  Social History Narrative   Lives with wife    caffeine- sodas    Allergies:  Allergies  Allergen Reactions  . Sulfonamide Derivatives Itching, Rash and Other (See Comments)    Metabolic Disorder Labs: Lab Results  Component Value Date   HGBA1C 5.8 (H) 07/22/2016   MPG 120 07/22/2016   No results found for: PROLACTIN No results found for: CHOL, TRIG, HDL, CHOLHDL, VLDL, LDLCALC No results found for: TSH  Therapeutic Level Labs: No results found for: LITHIUM No results found for: VALPROATE No components found for:  CBMZ  Current Medications: Current Outpatient Medications  Medication Sig Dispense Refill  . acetaminophen (TYLENOL) 500 MG tablet Take 1,000 mg by mouth at bedtime as needed (for pain.).     Marland Kitchen carbidopa-levodopa (SINEMET IR) 25-100 MG tablet TAKE 1 TABLET BY MOUTH THREE TIMES DAILY BEFORE MEALS (Patient taking differently: Take 1 tablet by mouth 3 (three) times daily before meals. ) 270 tablet 4  . cephALEXin (KEFLEX) 500 MG capsule Take 1 capsule (500 mg total) by mouth 4 (four) times daily. 20 capsule 0  . diazepam (VALIUM) 5 MG tablet Take 1 tablet (5 mg  total) by mouth daily as needed for anxiety. for anxiety 30 tablet 5  . DULoxetine (CYMBALTA) 60 MG capsule Take 1 capsule (60 mg total) by mouth daily. 90 capsule 3  . hydrochlorothiazide (HYDRODIURIL) 25 MG tablet Take 25 mg by mouth daily.     . methimazole (TAPAZOLE) 5 MG tablet Take 5 mg by mouth every morning.     . ondansetron (ZOFRAN) 4 MG tablet Take 1 tablet (4 mg total) by mouth every 6 (six) hours. 10 tablet 0  . oxyCODONE-acetaminophen (PERCOCET/ROXICET) 5-325 MG tablet Take 1 tablet by mouth every 4 (four) hours as needed. 10 tablet 0  . oxyCODONE-acetaminophen (PERCOCET/ROXICET) 5-325 MG tablet Take 2 tablets by mouth every 4 (four) hours as needed for severe pain. 6 tablet 0  . tamsulosin (FLOMAX) 0.4 MG CAPS capsule Take 1 capsule (0.4 mg total) by mouth daily. 14 capsule 0  . valsartan (DIOVAN) 320 MG tablet Take 320 mg by mouth daily.      No current facility-administered medications for this visit.      Musculoskeletal: Strength & Muscle Tone: within normal limits Gait & Station: normal Patient leans: N/A  Psychiatric Specialty Exam: Review of Systems  Musculoskeletal: Positive for back pain, joint pain and myalgias.  All other systems reviewed and are negative.   There were no vitals taken for this visit.There is no height or weight on file to calculate BMI.  General Appearance: NA  Eye Contact:  NA  Speech:  Clear and Coherent  Volume:  Normal  Mood:  Euthymic  Affect:  NA  Thought Process:  Goal Directed  Orientation:  Full (Time, Place, and Person)  Thought Content: WDL   Suicidal Thoughts:  No  Homicidal Thoughts:  No  Memory:  Immediate;   Good Recent;   Good Remote;   Good  Judgement:  Good  Insight:  Fair  Psychomotor Activity:  Normal  Concentration:  Concentration: Good and Attention Span: Good  Recall:  Good  Fund of Knowledge: Good  Language: Good  Akathisia:  No  Handed:  Right  AIMS (if indicated): not done  Assets:  Communication  Skills Desire for Improvement Resilience Social Support Talents/Skills  ADL's:  Intact  Cognition: WNL  Sleep:  Good   Screenings: Mini-Mental     Office Visit from 09/27/2018 in [facility] Office Visit from 12/22/2017 in [facility] Office Visit from 09/21/2017 in [facility] Office Visit from 04/01/2017 in [facility] Office Visit from 01/15/2017 in [facility]  Total Score (max 30 points )  29  30  28  27  27     PHQ2-9     Office Visit from 09/20/2018 in [facility]  PHQ-2 Total Score  0       Assessment and Plan:  This patient is a 73 year old male with a history of depression and anxiety.  He continues to do very well on this regimen and only uses the Valium a few times a week.  We will continue Cymbalta 60 mg daily for depression and Valium 5 mg daily as needed for anxiety.  He will return to see me in 6 months   Zachary Spiller, MD 06/06/2019, 9:19 AM

## 2019-06-16 ENCOUNTER — Other Ambulatory Visit: Payer: Self-pay

## 2019-06-16 ENCOUNTER — Ambulatory Visit: Payer: PPO | Admitting: Orthopedic Surgery

## 2019-06-16 ENCOUNTER — Ambulatory Visit (INDEPENDENT_AMBULATORY_CARE_PROVIDER_SITE_OTHER): Payer: PPO

## 2019-06-16 ENCOUNTER — Encounter: Payer: Self-pay | Admitting: Orthopedic Surgery

## 2019-06-16 VITALS — BP 129/78 | HR 73 | Temp 97.0°F | Ht 73.0 in | Wt 265.0 lb

## 2019-06-16 DIAGNOSIS — M79641 Pain in right hand: Secondary | ICD-10-CM

## 2019-06-16 NOTE — Patient Instructions (Addendum)
Apply aspercreme (pick up over the counter) right hand 3 times a day  Tylenol arthritis 3 times a day for arthritis pain    You have received an injection of steroids into the joint. 15% of patients will have increased pain within the 24 hours postinjection.   This is transient and will go away.   We recommend that you use ice packs on the injection site for 20 minutes every 2 hours and extra strength Tylenol 2 tablets every 8 as needed until the pain resolves.  If you continue to have pain after taking the Tylenol and using the ice please call the office for further instructions. Trigger Finger  Trigger finger (stenosing tenosynovitis) is a condition that causes a finger to get stuck in a bent position. Each finger has a tough, cord-like tissue that connects muscle to bone (tendon), and each tendon is surrounded by a tunnel of tissue (tendon sheath). To move your finger, your tendon needs to slide freely through the sheath. Trigger finger happens when the tendon or the sheath thickens, making it difficult to move your finger. Trigger finger can affect any finger or a thumb. It may affect more than one finger. Mild cases may clear up with rest and medicine. Severe cases require more treatment. What are the causes? Trigger finger is caused by a thickened finger tendon or tendon sheath. The cause of this thickening is not known. What increases the risk? The following factors may make you more likely to develop this condition:  Doing activities that require a strong grip.  Having rheumatoid arthritis, gout, or diabetes.  Being 43-39 years old.  Being a woman. What are the signs or symptoms? Symptoms of this condition include:  Pain when bending or straightening your finger.  Tenderness or swelling where your finger attaches to the palm of your hand.  A lump in the palm of your hand or on the inside of your finger.  Hearing a popping sound when you try to straighten your  finger.  Feeling a popping, catching, or locking sensation when you try to straighten your finger.  Being unable to straighten your finger. How is this diagnosed? This condition is diagnosed based on your symptoms and a physical exam. How is this treated? This condition may be treated by:  Resting your finger and avoiding activities that make symptoms worse.  Wearing a finger splint to keep your finger in a slightly bent position.  Taking NSAIDs to relieve pain and swelling.  Injecting medicine (steroids) into the tendon sheath to reduce swelling and irritation. Injections may need to be repeated.  Having surgery to open the tendon sheath. This may be done if other treatments do not work and you cannot straighten your finger. You may need physical therapy after surgery. Follow these instructions at home:   Use moist heat to help reduce pain and swelling as told by your health care provider.  Rest your finger and avoid activities that make pain worse. Return to normal activities as told by your health care provider.  If you have a splint, wear it as told by your health care provider.  Take over-the-counter and prescription medicines only as told by your health care provider.  Keep all follow-up visits as told by your health care provider. This is important. Contact a health care provider if:  Your symptoms are not improving with home care. Summary  Trigger finger (stenosing tenosynovitis) causes your finger to get stuck in a bent position, and it can make it  difficult and painful to straighten your finger.  This condition develops when a finger tendon or tendon sheath thickens.  Treatment starts with resting, wearing a splint, and taking NSAIDs.  In severe cases, surgery to open the tendon sheath may be needed. This information is not intended to replace advice given to you by your health care provider. Make sure you discuss any questions you have with your health care  provider. Document Released: 10/03/2004 Document Revised: 11/24/2016 Document Reviewed: 11/24/2016 Elsevier Interactive Patient Education  2019 [employer].

## 2019-06-16 NOTE — Progress Notes (Signed)
Chief Complaint  Patient presents with  . Hand Pain    Rt hand 3rd digit for 5-6 wks    Pain right long finger  Pain mild to moderate 5 to 6 weeks Dull ache Associated with swelling  Started after he fell many months ago never stopped hurting Also has clicking and pain in the palm over the A1 pulley as well as the MP and PIP joint of the right long finger    Allergies  Allergen Reactions  . Sulfonamide Derivatives Itching, Rash and Other (See Comments)     Current Outpatient Medications:  .  acetaminophen (TYLENOL) 500 MG tablet, Take 1,000 mg by mouth at bedtime as needed (for pain.). , Disp: , Rfl:  .  carbidopa-levodopa (SINEMET IR) 25-100 MG tablet, TAKE 1 TABLET BY MOUTH THREE TIMES DAILY BEFORE MEALS (Patient taking differently: Take 1 tablet by mouth 3 (three) times daily before meals. ), Disp: 270 tablet, Rfl: 4 .  diazepam (VALIUM) 5 MG tablet, Take 1 tablet (5 mg total) by mouth daily as needed for anxiety. for anxiety, Disp: 30 tablet, Rfl: 5 .  DULoxetine (CYMBALTA) 60 MG capsule, Take 1 capsule (60 mg total) by mouth daily., Disp: 90 capsule, Rfl: 3 .  fluconazole (DIFLUCAN) 150 MG tablet, TK 1 T PO QD, Disp: , Rfl:  .  hydrochlorothiazide (HYDRODIURIL) 25 MG tablet, Take 25 mg by mouth daily. , Disp: , Rfl:  .  methimazole (TAPAZOLE) 5 MG tablet, Take 5 mg by mouth every morning. , Disp: , Rfl:  .  ondansetron (ZOFRAN) 4 MG tablet, Take 1 tablet (4 mg total) by mouth every 6 (six) hours., Disp: 10 tablet, Rfl: 0 .  oxyCODONE-acetaminophen (PERCOCET/ROXICET) 5-325 MG tablet, Take 1 tablet by mouth every 4 (four) hours as needed., Disp: 10 tablet, Rfl: 0 .  oxyCODONE-acetaminophen (PERCOCET/ROXICET) 5-325 MG tablet, Take 2 tablets by mouth every 4 (four) hours as needed for severe pain., Disp: 6 tablet, Rfl: 0 .  tamsulosin (FLOMAX) 0.4 MG CAPS capsule, Take 1 capsule (0.4 mg total) by mouth daily., Disp: 14 capsule, Rfl: 0 .  valsartan (DIOVAN) 320 MG tablet, Take 320  mg by mouth daily. , Disp: , Rfl:    Past Medical History:  Diagnosis Date  . Anxiety    takes Valium as needed  . Arthritis   . Bladder cancer ([place])    takes Rapaflo daily  . Blood dyscrasia    07/08/16: pt told he was a "free bleeder" after bleeding a lot when derm cut off mole on forehead. No excessive bleeding with minor wounds at home, no bleeding problems perioperatively with prior surgeries.  . Chronic back pain    spondylolisthesis  . Cluster headaches   . Depression    takes Lexapro daily  . Essential hypertension, benign    takes Diovan-HCT daily  . Hx of complications due to general anesthesia    Aborted surgery 07/15/2016 due to hypotension per pt  . Joint pain   . Obstructive sleep apnea on CPAP    uses CPAP @ night  . Parkinsonism ([place])   . Pneumonia    "several times"--last time about 3-4 yrs ago  . Prediabetes      Current Outpatient Medications:  .  acetaminophen (TYLENOL) 500 MG tablet, Take 1,000 mg by mouth at bedtime as needed (for pain.). , Disp: , Rfl:  .  carbidopa-levodopa (SINEMET IR) 25-100 MG tablet, TAKE 1 TABLET BY MOUTH THREE TIMES DAILY BEFORE MEALS (Patient taking differently: Take  1 tablet by mouth 3 (three) times daily before meals. ), Disp: 270 tablet, Rfl: 4 .  diazepam (VALIUM) 5 MG tablet, Take 1 tablet (5 mg total) by mouth daily as needed for anxiety. for anxiety, Disp: 30 tablet, Rfl: 5 .  DULoxetine (CYMBALTA) 60 MG capsule, Take 1 capsule (60 mg total) by mouth daily., Disp: 90 capsule, Rfl: 3 .  fluconazole (DIFLUCAN) 150 MG tablet, TK 1 T PO QD, Disp: , Rfl:  .  hydrochlorothiazide (HYDRODIURIL) 25 MG tablet, Take 25 mg by mouth daily. , Disp: , Rfl:  .  methimazole (TAPAZOLE) 5 MG tablet, Take 5 mg by mouth every morning. , Disp: , Rfl:  .  ondansetron (ZOFRAN) 4 MG tablet, Take 1 tablet (4 mg total) by mouth every 6 (six) hours., Disp: 10 tablet, Rfl: 0 .  oxyCODONE-acetaminophen (PERCOCET/ROXICET) 5-325 MG tablet, Take 1 tablet by  mouth every 4 (four) hours as needed., Disp: 10 tablet, Rfl: 0 .  oxyCODONE-acetaminophen (PERCOCET/ROXICET) 5-325 MG tablet, Take 2 tablets by mouth every 4 (four) hours as needed for severe pain., Disp: 6 tablet, Rfl: 0 .  tamsulosin (FLOMAX) 0.4 MG CAPS capsule, Take 1 capsule (0.4 mg total) by mouth daily., Disp: 14 capsule, Rfl: 0 .  valsartan (DIOVAN) 320 MG tablet, Take 320 mg by mouth daily. 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  . Hand pain, right Yes     Diagnostics Injection Trigger finger injection  Diagnosis  rlf triggering  Procedure injection A1 pulley Medications lidocaine 1% 1 mL and Depo-Medrol 40 mg 1 mL Skin prep alcohol and ethyl chloride Verbal consent was obtained Timeout confirmed the injection site  After cleaning the skin with alcohol and anesthetizing the skin with ethyl chloride the A1 pulley was palpated and the injection was performed without complication  Plan: Over-the-counter medication Tylenol arthritis  OTC aspercreme  Fu prn

## 2019-06-26 ENCOUNTER — Other Ambulatory Visit: Payer: Self-pay

## 2019-06-26 DIAGNOSIS — Z20822 Contact with and (suspected) exposure to covid-19: Secondary | ICD-10-CM

## 2019-06-26 DIAGNOSIS — R6889 Other general symptoms and signs: Secondary | ICD-10-CM | POA: Diagnosis not present

## 2019-07-01 LAB — NOVEL CORONAVIRUS, NAA: SARS-CoV-2, NAA: NOT DETECTED

## 2019-07-05 DIAGNOSIS — H35432 Paving stone degeneration of retina, left eye: Secondary | ICD-10-CM | POA: Diagnosis not present

## 2019-07-05 DIAGNOSIS — E782 Mixed hyperlipidemia: Secondary | ICD-10-CM | POA: Diagnosis not present

## 2019-07-05 DIAGNOSIS — I10 Essential (primary) hypertension: Secondary | ICD-10-CM | POA: Diagnosis not present

## 2019-07-05 DIAGNOSIS — H31092 Other chorioretinal scars, left eye: Secondary | ICD-10-CM | POA: Diagnosis not present

## 2019-07-05 DIAGNOSIS — R7301 Impaired fasting glucose: Secondary | ICD-10-CM | POA: Diagnosis not present

## 2019-07-05 DIAGNOSIS — E1165 Type 2 diabetes mellitus with hyperglycemia: Secondary | ICD-10-CM | POA: Diagnosis not present

## 2019-07-05 DIAGNOSIS — R946 Abnormal results of thyroid function studies: Secondary | ICD-10-CM | POA: Diagnosis not present

## 2019-07-05 DIAGNOSIS — H35373 Puckering of macula, bilateral: Secondary | ICD-10-CM | POA: Diagnosis not present

## 2019-07-06 DIAGNOSIS — E1165 Type 2 diabetes mellitus with hyperglycemia: Secondary | ICD-10-CM | POA: Diagnosis not present

## 2019-07-06 DIAGNOSIS — I10 Essential (primary) hypertension: Secondary | ICD-10-CM | POA: Diagnosis not present

## 2019-07-06 DIAGNOSIS — E782 Mixed hyperlipidemia: Secondary | ICD-10-CM | POA: Diagnosis not present

## 2019-07-06 DIAGNOSIS — D508 Other iron deficiency anemias: Secondary | ICD-10-CM | POA: Diagnosis not present

## 2019-07-06 DIAGNOSIS — R7301 Impaired fasting glucose: Secondary | ICD-10-CM | POA: Diagnosis not present

## 2019-07-06 DIAGNOSIS — R946 Abnormal results of thyroid function studies: Secondary | ICD-10-CM | POA: Diagnosis not present

## 2019-07-11 DIAGNOSIS — Z96652 Presence of left artificial knee joint: Secondary | ICD-10-CM | POA: Diagnosis not present

## 2019-07-11 DIAGNOSIS — R000 Tachycardia, unspecified: Secondary | ICD-10-CM | POA: Diagnosis not present

## 2019-07-11 DIAGNOSIS — F419 Anxiety disorder, unspecified: Secondary | ICD-10-CM | POA: Diagnosis not present

## 2019-07-11 DIAGNOSIS — M6530 Trigger finger, unspecified finger: Secondary | ICD-10-CM | POA: Diagnosis not present

## 2019-07-11 DIAGNOSIS — R946 Abnormal results of thyroid function studies: Secondary | ICD-10-CM | POA: Diagnosis not present

## 2019-07-11 DIAGNOSIS — I10 Essential (primary) hypertension: Secondary | ICD-10-CM | POA: Diagnosis not present

## 2019-07-11 DIAGNOSIS — L989 Disorder of the skin and subcutaneous tissue, unspecified: Secondary | ICD-10-CM | POA: Diagnosis not present

## 2019-07-11 DIAGNOSIS — F339 Major depressive disorder, recurrent, unspecified: Secondary | ICD-10-CM | POA: Diagnosis not present

## 2019-07-11 DIAGNOSIS — E782 Mixed hyperlipidemia: Secondary | ICD-10-CM | POA: Diagnosis not present

## 2019-07-11 DIAGNOSIS — E1169 Type 2 diabetes mellitus with other specified complication: Secondary | ICD-10-CM | POA: Diagnosis not present

## 2019-07-11 DIAGNOSIS — G20 Parkinson's disease: Secondary | ICD-10-CM | POA: Diagnosis not present

## 2019-07-31 DIAGNOSIS — E782 Mixed hyperlipidemia: Secondary | ICD-10-CM | POA: Diagnosis not present

## 2019-07-31 DIAGNOSIS — R946 Abnormal results of thyroid function studies: Secondary | ICD-10-CM | POA: Diagnosis not present

## 2019-07-31 DIAGNOSIS — I10 Essential (primary) hypertension: Secondary | ICD-10-CM | POA: Diagnosis not present

## 2019-07-31 DIAGNOSIS — R7301 Impaired fasting glucose: Secondary | ICD-10-CM | POA: Diagnosis not present

## 2019-07-31 DIAGNOSIS — E1165 Type 2 diabetes mellitus with hyperglycemia: Secondary | ICD-10-CM | POA: Diagnosis not present

## 2019-08-07 ENCOUNTER — Other Ambulatory Visit (HOSPITAL_COMMUNITY): Payer: Self-pay | Admitting: Psychiatry

## 2019-08-07 ENCOUNTER — Telehealth (HOSPITAL_COMMUNITY): Payer: Self-pay | Admitting: *Deleted

## 2019-08-07 MED ORDER — DIAZEPAM 5 MG PO TABS: 5.0000 mg | ORAL_TABLET | Freq: Every day | ORAL | 5 refills | Status: DC | PRN

## 2019-08-07 MED ORDER — DULOXETINE HCL 60 MG PO CPEP: 60.0000 mg | ORAL_CAPSULE | Freq: Every day | ORAL | 3 refills | Status: DC

## 2019-08-07 NOTE — Telephone Encounter (Signed)
Valium and cymbalta sent to [facility]

## 2019-08-07 NOTE — Telephone Encounter (Signed)
PATIENT HAS NEVER Rx FILLED ON  DATED 6/9//2020 [facility]ENT TO [facility]. [facility] CALLED PATIENT REQUESTING THEY FILL SCRIPT BUT THEY CAN'T HAVE A UNFILLED SCRIPT SENT. SO THEY ARE REQUESTING A NEW SCRIPT [place] ON [address] [place]

## 2019-08-10 DIAGNOSIS — X32XXXD Exposure to sunlight, subsequent encounter: Secondary | ICD-10-CM | POA: Diagnosis not present

## 2019-08-10 DIAGNOSIS — D225 Melanocytic nevi of trunk: Secondary | ICD-10-CM | POA: Diagnosis not present

## 2019-08-10 DIAGNOSIS — L570 Actinic keratosis: Secondary | ICD-10-CM | POA: Diagnosis not present

## 2019-08-10 DIAGNOSIS — L820 Inflamed seborrheic keratosis: Secondary | ICD-10-CM | POA: Diagnosis not present

## 2019-08-29 DIAGNOSIS — R7301 Impaired fasting glucose: Secondary | ICD-10-CM | POA: Diagnosis not present

## 2019-08-29 DIAGNOSIS — E1165 Type 2 diabetes mellitus with hyperglycemia: Secondary | ICD-10-CM | POA: Diagnosis not present

## 2019-08-29 DIAGNOSIS — R946 Abnormal results of thyroid function studies: Secondary | ICD-10-CM | POA: Diagnosis not present

## 2019-08-29 DIAGNOSIS — E782 Mixed hyperlipidemia: Secondary | ICD-10-CM | POA: Diagnosis not present

## 2019-08-29 DIAGNOSIS — I10 Essential (primary) hypertension: Secondary | ICD-10-CM | POA: Diagnosis not present

## 2019-09-28 DIAGNOSIS — R946 Abnormal results of thyroid function studies: Secondary | ICD-10-CM | POA: Diagnosis not present

## 2019-09-28 DIAGNOSIS — E1165 Type 2 diabetes mellitus with hyperglycemia: Secondary | ICD-10-CM | POA: Diagnosis not present

## 2019-09-28 DIAGNOSIS — E782 Mixed hyperlipidemia: Secondary | ICD-10-CM | POA: Diagnosis not present

## 2019-09-28 DIAGNOSIS — R7301 Impaired fasting glucose: Secondary | ICD-10-CM | POA: Diagnosis not present

## 2019-09-28 DIAGNOSIS — I10 Essential (primary) hypertension: Secondary | ICD-10-CM | POA: Diagnosis not present

## 2019-10-03 DIAGNOSIS — E1165 Type 2 diabetes mellitus with hyperglycemia: Secondary | ICD-10-CM | POA: Diagnosis not present

## 2019-10-03 DIAGNOSIS — R946 Abnormal results of thyroid function studies: Secondary | ICD-10-CM | POA: Diagnosis not present

## 2019-10-03 DIAGNOSIS — D508 Other iron deficiency anemias: Secondary | ICD-10-CM | POA: Diagnosis not present

## 2019-10-03 DIAGNOSIS — E119 Type 2 diabetes mellitus without complications: Secondary | ICD-10-CM | POA: Diagnosis not present

## 2019-10-03 DIAGNOSIS — I10 Essential (primary) hypertension: Secondary | ICD-10-CM | POA: Diagnosis not present

## 2019-10-03 DIAGNOSIS — E1169 Type 2 diabetes mellitus with other specified complication: Secondary | ICD-10-CM | POA: Diagnosis not present

## 2019-10-03 DIAGNOSIS — E782 Mixed hyperlipidemia: Secondary | ICD-10-CM | POA: Diagnosis not present

## 2019-10-03 DIAGNOSIS — Z125 Encounter for screening for malignant neoplasm of prostate: Secondary | ICD-10-CM | POA: Diagnosis not present

## 2019-10-10 ENCOUNTER — Ambulatory Visit: Payer: PPO | Admitting: Diagnostic Neuroimaging

## 2019-10-10 ENCOUNTER — Encounter: Payer: Self-pay | Admitting: Diagnostic Neuroimaging

## 2019-10-10 ENCOUNTER — Other Ambulatory Visit: Payer: Self-pay

## 2019-10-10 VITALS — BP 119/72 | HR 82 | Temp 98.8°F | Ht 73.0 in | Wt 228.0 lb

## 2019-10-10 DIAGNOSIS — G20 Parkinson's disease: Secondary | ICD-10-CM

## 2019-10-10 DIAGNOSIS — R269 Unspecified abnormalities of gait and mobility: Secondary | ICD-10-CM

## 2019-10-10 MED ORDER — CARBIDOPA-LEVODOPA 25-100 MG PO TABS: 1.0000 | ORAL_TABLET | Freq: Three times a day (TID) | ORAL | 4 refills | Status: DC

## 2019-10-10 NOTE — Progress Notes (Signed)
[facility]  PATIENT: Zachary [name] DOB: 06/12/1946  REFERRING CLINICIAN: Merlyn Albert, MD HISTORY FROM: patient and wife REASON FOR VISIT: follow up   HISTORICAL  CHIEF COMPLAINT:  Chief Complaint  Patient presents with  . Parkinsonism    rm 7, one year FU, wife- [name], "no new concerns, wife- memory is worse" MMSE 28    HISTORY OF PRESENT ILLNESS:   UPDATE (10/10/19, VRP): Since last visit, doing well. Symptoms are stable. Severity is mild. No alleviating or aggravating factors. Tolerating meds. Hearing is slightly off, and sometimes he has arguments with spouse about recall of events.   UPDATE (09/27/18, VRP): Since last visit, doing well. Symptoms are stable. Severity is mild. No alleviating or aggravating factors. Tolerating meds.  2 falls since last visit.   UPDATE (12/22/17, VRP): Since last visit, doing about the same. Wife is under high stress. She feels that patient has lost motivation. Patient feels fair. Tolerating meds. No alleviating or aggravating factors.   UPDATE 09/21/17: Since last visit, stable, but out of meds for anxiety. Needs a new psychiatry clinic.   UPDATE 04/01/17: Since last visit has had MRI brain and c-spine, and had PT evaluation. Memory loss and gait issues are stable. Has life stress with concern about his mother's health. He is looking forward to football pre-season starting.   PRIOR HPI (01/15/17): 73 year old male here for evaluation of memory problems and gait difficulty. Patient reports memory problems for past 1 year. Wife thinks he has been having memory problem for at least 3 years. He is forgetting dates, fax, having trouble with comprehension, sleeping more. Also patient having more balance and walking problems. She has noticed that patient is taking small shuffling steps. 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 Following these  2 surgeries he had extensive physical therapy and rehabilitation. Patient attributes his balance problems to his low back surgery problem. Patient also has history of bladder cancer, hypertension, ringing in ears, depression, anxiety. Patient has had deterioration handwriting, irregular sleep, soft and hoarse voice. Patient having some intermittent tremor in his hands.   REVIEW OF SYSTEMS: Full 14 system review of systems performed and negative with exception of: as per HPI.    ALLERGIES: Allergies  Allergen Reactions  . Sulfonamide Derivatives Itching, Rash and Other (See Comments)    HOME MEDICATIONS: Outpatient Medications Prior to Visit  Medication Sig Dispense Refill  . acetaminophen (TYLENOL) 500 MG tablet Take 1,000 mg by mouth at bedtime as needed (for pain.).     Marland Kitchen carbidopa-levodopa (SINEMET IR) 25-100 MG tablet TAKE 1 TABLET BY MOUTH THREE TIMES DAILY BEFORE MEALS (Patient taking differently: Take 1 tablet by mouth 3 (three) times daily before meals. ) 270 tablet 4  . diazepam (VALIUM) 5 MG tablet Take 1 tablet (5 mg total) by mouth daily as needed for anxiety. for anxiety 30 tablet 5  . DULoxetine (CYMBALTA) 60 MG capsule Take 1 capsule (60 mg total) by mouth daily. 90 capsule 3  . methimazole (TAPAZOLE) 5 MG tablet Take 5 mg by mouth every morning.     . tamsulosin (FLOMAX) 0.4 MG CAPS capsule Take 1 capsule (0.4 mg total) by mouth daily. 14 capsule 0  . valsartan-hydrochlorothiazide (DIOVAN-HCT) 320-25 MG tablet TK 1 T PO QD    . hydrochlorothiazide (HYDRODIURIL) 25 MG tablet Take 25 mg by mouth daily.     . valsartan (DIOVAN) 320 MG  tablet Take 320 mg by mouth daily.     . fluconazole (DIFLUCAN) 150 MG tablet TK 1 T PO QD    . ondansetron (ZOFRAN) 4 MG tablet Take 1 tablet (4 mg total) by mouth every 6 (six) hours. 10 tablet 0  . oxyCODONE-acetaminophen (PERCOCET/ROXICET) 5-325 MG tablet Take 1 tablet by mouth every 4 (four) hours as needed. 10 tablet 0  .  oxyCODONE-acetaminophen (PERCOCET/ROXICET) 5-325 MG tablet Take 2 tablets by mouth every 4 (four) hours as needed for severe pain. 6 tablet 0   No facility-administered medications prior to visit.     PAST MEDICAL HISTORY: Past Medical History:  Diagnosis Date  . Anxiety    takes Valium as needed  . Arthritis   . Bladder cancer ([place])    takes Rapaflo daily  . Blood dyscrasia    07/08/16: pt told he was a "free bleeder" after bleeding a lot when derm cut off mole on forehead. No excessive bleeding with minor wounds at home, no bleeding problems perioperatively with prior surgeries.  . Chronic back pain    spondylolisthesis  . Cluster headaches   . Depression    takes Lexapro daily  . Essential hypertension, benign    takes Diovan-HCT daily  . Hx of complications due to general anesthesia    Aborted surgery 07/15/2016 due to hypotension per pt  . Joint pain   . Kidney stone 03/2019   passed stone  . Obstructive sleep apnea on CPAP    uses CPAP @ night  . Parkinsonism ([place])   . Pneumonia    "several times"--last time about 3-4 yrs ago  . Prediabetes   . Thyroid condition     PAST SURGICAL HISTORY: Past Surgical History:  Procedure Laterality Date  . ANTERIOR CERVICAL DECOMP/DISCECTOMY FUSION N/A 06/05/2013   Procedure:  C3-4 Anterior Cervical Discectomy and Fusion, Allograft, Plate;  Surgeon: [name], MD;  Location: [facility];  Service: Orthopedics;  Laterality: N/A;  C3-4 Anterior Cervical Discectomy and Fusion, Allograft, Plate  . Arthroscopic knee surgery    . BACK SURGERY      x 2  . BLADDER SURGERY     x 5 to remove tumor  . CARPAL TUNNEL RELEASE    . CATARACT EXTRACTION W/PHACO  12/19/2012   Procedure: CATARACT EXTRACTION PHACO AND INTRAOCULAR LENS PLACEMENT (IOC);  Surgeon: [name], MD;  Location: [facility];  Service: Ophthalmology;  Laterality: Left;  CDE: 26.80  . CATARACT EXTRACTION W/PHACO  01/09/2013   Procedure: CATARACT EXTRACTION PHACO AND INTRAOCULAR LENS  PLACEMENT (IOC);  Surgeon: [name], MD;  Location: [facility];  Service: Ophthalmology;  Laterality: Right;  CDE:22.83  . CERVICAL FUSION  06/05/2013   C 3  C4  . CYSTOSCOPY    . REFRACTIVE SURGERY     Hx: of  . RETINAL DETACHMENT SURGERY    . TOTAL KNEE ARTHROPLASTY Left 02/01/2018   Procedure: TOTAL KNEE ARTHROPLASTY;  Surgeon: Carole Civil, MD;  Location: [facility];  Service: Orthopedics;  Laterality: Left;  . TRANSURETHRAL RESECTION OF BLADDER TUMOR Right 07/27/2015   Procedure: TRANSURETHRAL RESECTION OF BLADDER TUMOR (TURBT);  Surgeon: [name], MD;  Location: [facility];  Service: Urology;  Laterality: Right;  . wisdom tooth extraction      FAMILY HISTORY: Family History  Problem Relation Age of Onset  . Hypertension Father   . Cancer Father        Prostate  . Depression Mother   . Hypertension Mother   .  Alcohol abuse Mother   . COPD Mother        passed 07-08-17  . Hypertension Sister   . Anxiety disorder Sister   . Alcohol abuse Maternal Grandfather   . ADD / ADHD Neg Hx   . Bipolar disorder Neg Hx   . Dementia Neg Hx   . Drug abuse Neg Hx   . OCD Neg Hx   . Paranoid behavior Neg Hx   . Schizophrenia Neg Hx   . Seizures Neg Hx   . Sexual abuse Neg Hx   . Physical abuse Neg Hx     SOCIAL HISTORY:  Social History   Socioeconomic History  . Marital status: Married    Spouse name: [name]   . Number of children: 0  . Years of education: 23  . Highest education level: Not on file  Occupational History  . Occupation: Retired    Fish farm manager: UNEMPLOYED  Social Needs  . Financial resource strain: Not on file  . Food insecurity    Worry: Not on file    Inability: Not on file  . Transportation needs    Medical: Not on file    Non-medical: Not on file  Tobacco Use  . Smoking status: Former Smoker    Quit date: 12/30/1976    Years since quitting: 42.8  . Smokeless tobacco: Never Used  . Tobacco comment: 1985  Substance and Sexual Activity  . Alcohol use:  No    Comment: quit 1990  . Drug use: No  . Sexual activity: Yes  Lifestyle  . Physical activity    Days per week: Not on file    Minutes per session: Not on file  . Stress: Not on file  Relationships  . Social Herbalist on phone: Not on file    Gets together: Not on file    Attends religious service: Not on file    Active member of club or organization: Not on file    Attends meetings of clubs or organizations: Not on file    Relationship status: Not on file  . Intimate partner violence    Fear of current or ex partner: Not on file    Emotionally abused: Not on file    Physically abused: Not on file    Forced sexual activity: Not on file  Other Topics Concern  . Not on file  Social History Narrative   Lives with wife    caffeine- sodas     PHYSICAL EXAM  GENERAL EXAM/CONSTITUTIONAL: Vitals:  Vitals:   10/10/19 0907  BP: 119/72  Pulse: 82  Temp: 98.8 F (37.1 C)  Weight: 228 lb (103.4 kg)  Height: 6\' 1"  (1.854 m)   Body mass index is 30.08 kg/m. No exam data present  Patient is in no distress; well developed, nourished and groomed; neck is supple  FLAT AFFECT, MASKED FACIES  CARDIOVASCULAR:  Examination of carotid arteries is normal; no carotid bruits  Regular rate and rhythm, no murmurs  Examination of peripheral vascular system by observation and palpation is normal  EYES:  Ophthalmoscopic exam of optic discs and posterior segments is normal; no papilledema or hemorrhages  MUSCULOSKELETAL:  Gait, strength, tone, movements noted in Neurologic exam below  NEUROLOGIC: MENTAL STATUS:  MMSE - Mini Mental State Exam 10/10/2019 09/27/2018 12/22/2017  Orientation to time 5 5 5   Orientation to Place 4 5 5   Registration 3 3 3   Attention/ Calculation 5 5 5   Recall 3 3 3  Language- name 2 objects 2 2 2   Language- repeat 0 1 1  Language- follow 3 step command 3 2 3   Language- follow 3 step command-comments - handed paper back to RN -   Language- read & follow direction 1 1 1   Write a sentence 1 1 1   Copy design 1 1 1   Total score 28 29 30     awake, alert, oriented to person, place and time  recent and remote memory intact  normal attention and concentration  language fluent, comprehension intact, naming intact,   fund of knowledge appropriate  CRANIAL NERVE:   2nd - no papilledema on fundoscopic exam  2nd, 3rd, 4th, 6th - pupils equal and reactive to light, visual fields full to confrontation, extraocular muscles intact, no nystagmus  5th - facial sensation symmetric  7th - facial strength symmetric  8th - hearing intact  9th - palate elevates symmetrically, uvula midline  11th - shoulder shrug symmetric  12th - tongue protrusion midline  SOFT HOARSE VOICE  MASKED FACIES  MOTOR:   normal bulk; full strength in the BUE, BLE  NO BRADYKINESIA; NO RIGIDITY  MINIMAL RESTING TREMOR IN RUE > LUE WITH CONTRALATERAL RAM  SENSORY:   normal and symmetric to light touch, temperature, vibration; EXCEPT DECR IN FEET  COORDINATION:   finger-nose-finger, fine finger movements SLOW  REFLEXES:   deep tendon reflexes TRACE and symmetric  GAIT/STATION:   narrow based gait; DECR RIGHT ARM SWING; STOOPED POSTURE     DIAGNOSTIC DATA (LABS, IMAGING, TESTING) - I reviewed patient records, labs, notes, testing and imaging myself where available.  Lab Results  Component Value Date   WBC 8.8 04/24/2019   HGB 14.2 04/24/2019   HCT 43.3 04/24/2019   MCV 93.1 04/24/2019   PLT 220 04/24/2019      Component Value Date/Time   NA 140 04/24/2019 1959   K 3.6 04/24/2019 1959   CL 105 04/24/2019 1959   CO2 25 04/24/2019 1959   GLUCOSE 99 04/24/2019 1959   BUN 20 04/24/2019 1959   CREATININE 1.22 04/24/2019 1959   CALCIUM 8.8 (L) 04/24/2019 1959   PROT 7.9 02/18/2018 1030   ALBUMIN 4.0 02/18/2018 1030   AST 24 02/18/2018 1030   ALT 14 (L) 02/18/2018 1030   ALKPHOS 103 02/18/2018 1030   BILITOT  0.6 02/18/2018 1030   GFRNONAA 59 (L) 04/24/2019 1959   GFRAA >60 04/24/2019 1959   No results found for: CHOL, HDL, LDLCALC, LDLDIRECT, TRIG, CHOLHDL Lab Results  Component Value Date   HGBA1C 5.8 (H) 07/22/2016   No results found for: VITAMINB12 No results found for: TSH  01/28/12 MRI brain  - Nonspecific slightly atypical white matter type changes as noted above. - No internal auditory canal or skull base abnormality is noted.  01/28/12 MRA head - Intracranial atherosclerotic type changes predominant involving branch vessels as detailed above.  01/28/12 MRV head - Loss of signal of the left transverse sinus probably is related to artifact rather than true stenosis. Overall, the major dural sinuses appear patent.   05/09/13 MRI cervical [I reviewed images myself and agree with interpretation. -VRP]  - Multilevel cervical spondylosis detailed above.  Progressive C3-C4 degenerative disc disease with flattening of the cervical cord. Moderate to severe central stenosis with 7 mm AP diameter of the thecal sac.  No cord edema.  C3-C4 further collapse of the disc space and the extrusion appears slightly larger than on the prior exam.  Other levels appear similar with multilevel  mild central stenosis and bilateral foraminal stenosis.  In this patient with left upper extremity radiculopathy, the left-sided foraminal stenosis is most pronounced at C6-C7, potentially affecting the left C7 nerve.  06/12/16 MRI lumbar spine [I reviewed images myself and agree with interpretation. -VRP]  - Severe stenosis at L3-4 is multifactorial, related to central protrusion, posterior element hypertrophy and short pedicles affecting both L4 nerve roots. - 2 mm slip L4-5 associated with a central and leftward protrusion also with posterior element hypertrophy and short pedicles. Moderate to severe stenosis along with LEFT greater than RIGHT L5 nerve root impingement.  - No recurrent leftward protrusion at the L5-S1  level, however a central and rightward protrusion at L5-S1 could affect the RIGHT S1 nerve root.  12/23/16 CT head [I reviewed images myself and agree with interpretation. -VRP]  - Mild chronic ischemic white matter disease. No acute intracranial abnormality seen.  12/23/16 CT cervical [I reviewed images myself and agree with interpretation. -VRP]  - Postsurgical and degenerative changes as described above. No acute abnormality seen in the cervical spine.  01/23/17 MRI of the brain without contrast shows the following: 1.     Moderate cortical atrophy, more pronounced in the mesial temporal lobes, which has significantly progressed when compared to the 01/28/2012 MRI. 2.    Mild to moderate chronic microvascular ischemic changes, mildly progressed when compared to the previous MRI. 3.    There are no acute findings.  01/23/17 MRI of the cervical spine without contrast shows the following: 1.    Since the MRI of the cervical spine dated 05/09/2013, the patient has undergone C3-C4 ACDF with resolution of the spinal stenosis noted at that time. 2.    There are multilevel degenerative changes resulting in mild spinal stenosis at C4-C5. Our various degrees of mild-to-moderate foraminal narrowing as detailed above that did not lead to nerve root compression. 3.    The spinal cord appears normal. 4.    There are no acute findings.     ASSESSMENT AND PLAN  72 y.o. year old male here with progressive memory problems, short-term recall issues, cognitive difficulty, balance and walking problems, tremor. Also with signs and symptoms of parkinsonism. Other contributing factors could include depression, chronic pain, degenerative spine disease, aging and deconditioning.  Ddx memory loss: depression, chronic pain, life stress, neurodegenerative   Ddx balance difficulty: parkinsonism, cervical myelopathy, lumbar radiculopathy  1. Parkinsonism, unspecified Parkinsonism type ([place])   2. Gait difficulty      PLAN:  PARKINSON'S DISESASE (stable) - continue carb/levo 1 tab three times a day - continue PT exercises - fall precautions reviewed  MEMORY LOSS - stable; monitor - consider hearing loss  DEPRESSION / ANXIETY - improved; continue treatments per psychiatry  MARITAL STRAIN - stable  Meds ordered this encounter  Medications  . carbidopa-levodopa (SINEMET IR) 25-100 MG tablet    Sig: Take 1 tablet by mouth 3 (three) times daily before meals.    Dispense:  270 tablet    Refill:  4   Return in about 1 year (around 10/09/2020).    Penni Bombard, MD [id number], [id number] AM Certified in Neurology, Neurophysiology and Neuroimaging  [facility] [address] [place], [place] [address] [phone]

## 2019-10-11 DIAGNOSIS — F339 Major depressive disorder, recurrent, unspecified: Secondary | ICD-10-CM | POA: Diagnosis not present

## 2019-10-11 DIAGNOSIS — F419 Anxiety disorder, unspecified: Secondary | ICD-10-CM | POA: Diagnosis not present

## 2019-10-11 DIAGNOSIS — Z87442 Personal history of urinary calculi: Secondary | ICD-10-CM | POA: Diagnosis not present

## 2019-10-11 DIAGNOSIS — Z2829 Immunization not carried out because of patient decision for other reason: Secondary | ICD-10-CM | POA: Diagnosis not present

## 2019-10-11 DIAGNOSIS — E782 Mixed hyperlipidemia: Secondary | ICD-10-CM | POA: Diagnosis not present

## 2019-10-11 DIAGNOSIS — R000 Tachycardia, unspecified: Secondary | ICD-10-CM | POA: Diagnosis not present

## 2019-10-11 DIAGNOSIS — I10 Essential (primary) hypertension: Secondary | ICD-10-CM | POA: Diagnosis not present

## 2019-10-11 DIAGNOSIS — G20 Parkinson's disease: Secondary | ICD-10-CM | POA: Diagnosis not present

## 2019-10-11 DIAGNOSIS — R946 Abnormal results of thyroid function studies: Secondary | ICD-10-CM | POA: Diagnosis not present

## 2019-10-11 DIAGNOSIS — Z8551 Personal history of malignant neoplasm of bladder: Secondary | ICD-10-CM | POA: Diagnosis not present

## 2019-10-11 DIAGNOSIS — E1169 Type 2 diabetes mellitus with other specified complication: Secondary | ICD-10-CM | POA: Diagnosis not present

## 2019-10-11 DIAGNOSIS — Z96652 Presence of left artificial knee joint: Secondary | ICD-10-CM | POA: Diagnosis not present

## 2019-11-01 DIAGNOSIS — Z20828 Contact with and (suspected) exposure to other viral communicable diseases: Secondary | ICD-10-CM | POA: Diagnosis not present

## 2019-11-20 DIAGNOSIS — B349 Viral infection, unspecified: Secondary | ICD-10-CM | POA: Diagnosis not present

## 2019-11-20 DIAGNOSIS — Z1159 Encounter for screening for other viral diseases: Secondary | ICD-10-CM | POA: Diagnosis not present

## 2019-11-20 DIAGNOSIS — Z2089 Contact with and (suspected) exposure to other communicable diseases: Secondary | ICD-10-CM | POA: Diagnosis not present

## 2019-12-06 ENCOUNTER — Ambulatory Visit (HOSPITAL_COMMUNITY): Payer: PPO | Admitting: Psychiatry

## 2019-12-06 ENCOUNTER — Other Ambulatory Visit: Payer: Self-pay

## 2019-12-08 DIAGNOSIS — R000 Tachycardia, unspecified: Secondary | ICD-10-CM | POA: Diagnosis not present

## 2019-12-08 DIAGNOSIS — Z96652 Presence of left artificial knee joint: Secondary | ICD-10-CM | POA: Diagnosis not present

## 2019-12-08 DIAGNOSIS — Z8551 Personal history of malignant neoplasm of bladder: Secondary | ICD-10-CM | POA: Diagnosis not present

## 2019-12-08 DIAGNOSIS — E1169 Type 2 diabetes mellitus with other specified complication: Secondary | ICD-10-CM | POA: Diagnosis not present

## 2019-12-08 DIAGNOSIS — G20 Parkinson's disease: Secondary | ICD-10-CM | POA: Diagnosis not present

## 2019-12-08 DIAGNOSIS — R946 Abnormal results of thyroid function studies: Secondary | ICD-10-CM | POA: Diagnosis not present

## 2019-12-08 DIAGNOSIS — E7849 Other hyperlipidemia: Secondary | ICD-10-CM | POA: Diagnosis not present

## 2019-12-08 DIAGNOSIS — Z2829 Immunization not carried out because of patient decision for other reason: Secondary | ICD-10-CM | POA: Diagnosis not present

## 2019-12-08 DIAGNOSIS — F419 Anxiety disorder, unspecified: Secondary | ICD-10-CM | POA: Diagnosis not present

## 2019-12-08 DIAGNOSIS — F339 Major depressive disorder, recurrent, unspecified: Secondary | ICD-10-CM | POA: Diagnosis not present

## 2019-12-08 DIAGNOSIS — I10 Essential (primary) hypertension: Secondary | ICD-10-CM | POA: Diagnosis not present

## 2019-12-08 DIAGNOSIS — Z87442 Personal history of urinary calculi: Secondary | ICD-10-CM | POA: Diagnosis not present

## 2020-01-17 DIAGNOSIS — E782 Mixed hyperlipidemia: Secondary | ICD-10-CM | POA: Diagnosis not present

## 2020-01-17 DIAGNOSIS — E119 Type 2 diabetes mellitus without complications: Secondary | ICD-10-CM | POA: Diagnosis not present

## 2020-01-17 DIAGNOSIS — I10 Essential (primary) hypertension: Secondary | ICD-10-CM | POA: Diagnosis not present

## 2020-01-17 DIAGNOSIS — E1165 Type 2 diabetes mellitus with hyperglycemia: Secondary | ICD-10-CM | POA: Diagnosis not present

## 2020-01-17 DIAGNOSIS — E1169 Type 2 diabetes mellitus with other specified complication: Secondary | ICD-10-CM | POA: Diagnosis not present

## 2020-01-17 DIAGNOSIS — R946 Abnormal results of thyroid function studies: Secondary | ICD-10-CM | POA: Diagnosis not present

## 2020-01-23 DIAGNOSIS — Z2829 Immunization not carried out because of patient decision for other reason: Secondary | ICD-10-CM | POA: Diagnosis not present

## 2020-01-23 DIAGNOSIS — I10 Essential (primary) hypertension: Secondary | ICD-10-CM | POA: Diagnosis not present

## 2020-01-23 DIAGNOSIS — Z96652 Presence of left artificial knee joint: Secondary | ICD-10-CM | POA: Diagnosis not present

## 2020-01-23 DIAGNOSIS — G20 Parkinson's disease: Secondary | ICD-10-CM | POA: Diagnosis not present

## 2020-01-23 DIAGNOSIS — Z87442 Personal history of urinary calculi: Secondary | ICD-10-CM | POA: Diagnosis not present

## 2020-01-23 DIAGNOSIS — F339 Major depressive disorder, recurrent, unspecified: Secondary | ICD-10-CM | POA: Diagnosis not present

## 2020-01-23 DIAGNOSIS — F419 Anxiety disorder, unspecified: Secondary | ICD-10-CM | POA: Diagnosis not present

## 2020-01-23 DIAGNOSIS — Z8551 Personal history of malignant neoplasm of bladder: Secondary | ICD-10-CM | POA: Diagnosis not present

## 2020-01-23 DIAGNOSIS — R946 Abnormal results of thyroid function studies: Secondary | ICD-10-CM | POA: Diagnosis not present

## 2020-01-23 DIAGNOSIS — E782 Mixed hyperlipidemia: Secondary | ICD-10-CM | POA: Diagnosis not present

## 2020-01-23 DIAGNOSIS — R000 Tachycardia, unspecified: Secondary | ICD-10-CM | POA: Diagnosis not present

## 2020-01-23 DIAGNOSIS — E1169 Type 2 diabetes mellitus with other specified complication: Secondary | ICD-10-CM | POA: Diagnosis not present

## 2020-01-26 DIAGNOSIS — I10 Essential (primary) hypertension: Secondary | ICD-10-CM | POA: Diagnosis not present

## 2020-01-26 DIAGNOSIS — F419 Anxiety disorder, unspecified: Secondary | ICD-10-CM | POA: Diagnosis not present

## 2020-01-26 DIAGNOSIS — F339 Major depressive disorder, recurrent, unspecified: Secondary | ICD-10-CM | POA: Diagnosis not present

## 2020-01-26 DIAGNOSIS — E1169 Type 2 diabetes mellitus with other specified complication: Secondary | ICD-10-CM | POA: Diagnosis not present

## 2020-01-26 DIAGNOSIS — R946 Abnormal results of thyroid function studies: Secondary | ICD-10-CM | POA: Diagnosis not present

## 2020-01-26 DIAGNOSIS — E782 Mixed hyperlipidemia: Secondary | ICD-10-CM | POA: Diagnosis not present

## 2020-01-26 DIAGNOSIS — Z8551 Personal history of malignant neoplasm of bladder: Secondary | ICD-10-CM | POA: Diagnosis not present

## 2020-01-26 DIAGNOSIS — Z96652 Presence of left artificial knee joint: Secondary | ICD-10-CM | POA: Diagnosis not present

## 2020-01-26 DIAGNOSIS — R000 Tachycardia, unspecified: Secondary | ICD-10-CM | POA: Diagnosis not present

## 2020-01-26 DIAGNOSIS — G20 Parkinson's disease: Secondary | ICD-10-CM | POA: Diagnosis not present

## 2020-01-26 DIAGNOSIS — Z87442 Personal history of urinary calculi: Secondary | ICD-10-CM | POA: Diagnosis not present

## 2020-01-26 DIAGNOSIS — Z2829 Immunization not carried out because of patient decision for other reason: Secondary | ICD-10-CM | POA: Diagnosis not present

## 2020-02-09 ENCOUNTER — Emergency Department (HOSPITAL_COMMUNITY): Payer: PPO

## 2020-02-09 ENCOUNTER — Other Ambulatory Visit: Payer: Self-pay

## 2020-02-09 ENCOUNTER — Encounter (HOSPITAL_COMMUNITY): Payer: Self-pay | Admitting: Emergency Medicine

## 2020-02-09 ENCOUNTER — Emergency Department (HOSPITAL_COMMUNITY)
Admission: EM | Admit: 2020-02-09 | Discharge: 2020-02-09 | Disposition: A | Discharge status: Home / Self Care | Payer: PPO | Attending: Emergency Medicine | Admitting: Emergency Medicine

## 2020-02-09 DIAGNOSIS — Z79899 Other long term (current) drug therapy: Secondary | ICD-10-CM | POA: Insufficient documentation

## 2020-02-09 DIAGNOSIS — R109 Unspecified abdominal pain: Secondary | ICD-10-CM | POA: Diagnosis present

## 2020-02-09 DIAGNOSIS — Z96652 Presence of left artificial knee joint: Secondary | ICD-10-CM | POA: Diagnosis not present

## 2020-02-09 DIAGNOSIS — Z87891 Personal history of nicotine dependence: Secondary | ICD-10-CM | POA: Diagnosis not present

## 2020-02-09 DIAGNOSIS — N200 Calculus of kidney: Secondary | ICD-10-CM | POA: Diagnosis not present

## 2020-02-09 DIAGNOSIS — N132 Hydronephrosis with renal and ureteral calculous obstruction: Secondary | ICD-10-CM | POA: Diagnosis not present

## 2020-02-09 DIAGNOSIS — I10 Essential (primary) hypertension: Secondary | ICD-10-CM | POA: Diagnosis not present

## 2020-02-09 LAB — CBC WITH DIFFERENTIAL/PLATELET
Abs Immature Granulocytes: 0.05 10*3/uL (ref 0.00–0.07)
Basophils Absolute: 0.1 10*3/uL (ref 0.0–0.1)
Basophils Relative: 0 %
Eosinophils Absolute: 0.2 10*3/uL (ref 0.0–0.5)
Eosinophils Relative: 1 %
HCT: 44.8 % (ref 39.0–52.0)
Hemoglobin: 14.4 g/dL (ref 13.0–17.0)
Immature Granulocytes: 0 %
Lymphocytes Relative: 11 %
Lymphs Abs: 1.5 10*3/uL (ref 0.7–4.0)
MCH: 29.8 pg (ref 26.0–34.0)
MCHC: 32.1 g/dL (ref 30.0–36.0)
MCV: 92.6 fL (ref 80.0–100.0)
Monocytes Absolute: 0.7 10*3/uL (ref 0.1–1.0)
Monocytes Relative: 5 %
Neutro Abs: 11.9 10*3/uL — ABNORMAL HIGH (ref 1.7–7.7)
Neutrophils Relative %: 83 %
Platelets: 206 10*3/uL (ref 150–400)
RBC: 4.84 MIL/uL (ref 4.22–5.81)
RDW: 13.4 % (ref 11.5–15.5)
WBC: 14.4 10*3/uL — ABNORMAL HIGH (ref 4.0–10.5)
nRBC: 0 % (ref 0.0–0.2)

## 2020-02-09 LAB — URINALYSIS, ROUTINE W REFLEX MICROSCOPIC
Bacteria, UA: NONE SEEN
Bilirubin Urine: NEGATIVE
Glucose, UA: NEGATIVE mg/dL
Ketones, ur: NEGATIVE mg/dL
Leukocytes,Ua: NEGATIVE
Nitrite: NEGATIVE
Protein, ur: NEGATIVE mg/dL
RBC / HPF: >50 RBC/hpf — ABNORMAL HIGH (ref 0–5)
Specific Gravity, Urine: 1.016 (ref 1.005–1.030)
pH: 5 (ref 5.0–8.0)

## 2020-02-09 LAB — COMPREHENSIVE METABOLIC PANEL
ALT: 7 U/L (ref 0–44)
AST: 27 U/L (ref 15–41)
Albumin: 3.9 g/dL (ref 3.5–5.0)
Alkaline Phosphatase: 97 U/L (ref 38–126)
Anion gap: 10 (ref 5–15)
BUN: 26 mg/dL — ABNORMAL HIGH (ref 8–23)
CO2: 26 mmol/L (ref 22–32)
Calcium: 8.7 mg/dL — ABNORMAL LOW (ref 8.9–10.3)
Chloride: 104 mmol/L (ref 98–111)
Creatinine, Ser: 1.43 mg/dL — ABNORMAL HIGH (ref 0.61–1.24)
GFR calc Af Amer: 56 mL/min — ABNORMAL LOW (ref >60)
GFR calc non Af Amer: 48 mL/min — ABNORMAL LOW (ref >60)
Glucose, Bld: 111 mg/dL — ABNORMAL HIGH (ref 70–99)
Potassium: 3.7 mmol/L (ref 3.5–5.1)
Sodium: 140 mmol/L (ref 135–145)
Total Bilirubin: 0.6 mg/dL (ref 0.3–1.2)
Total Protein: 7.4 g/dL (ref 6.5–8.1)

## 2020-02-09 MED ORDER — OXYCODONE-ACETAMINOPHEN 5-325 MG PO TABS: 1.0000 | ORAL_TABLET | Freq: Four times a day (QID) | ORAL | 0 refills | Status: DC | PRN

## 2020-02-09 MED ORDER — SODIUM CHLORIDE 0.9 % IV BOLUS
500.0000 mL | Freq: Once | INTRAVENOUS | Status: AC
  Administered 2020-02-09: 500 mL via INTRAVENOUS

## 2020-02-09 MED ORDER — MORPHINE SULFATE (PF) 4 MG/ML IV SOLN
4.0000 mg | Freq: Once | INTRAVENOUS | Status: AC
  Administered 2020-02-09: 18:00:00 4 mg via INTRAVENOUS
  Filled 2020-02-09: qty 1

## 2020-02-09 MED ORDER — TAMSULOSIN HCL 0.4 MG PO CAPS: 0.4000 mg | ORAL_CAPSULE | Freq: Every day | ORAL | 0 refills | Status: DC

## 2020-02-09 MED ORDER — ONDANSETRON HCL 4 MG/2ML IJ SOLN
4.0000 mg | Freq: Once | INTRAMUSCULAR | Status: AC
  Administered 2020-02-09: 4 mg via INTRAVENOUS
  Filled 2020-02-09: qty 2

## 2020-02-09 MED ORDER — ONDANSETRON 4 MG PO TBDP: 4.0000 mg | ORAL_TABLET | Freq: Three times a day (TID) | ORAL | 0 refills | Status: DC | PRN

## 2020-02-09 MED ORDER — HYDROMORPHONE HCL 1 MG/ML IJ SOLN
0.5000 mg | Freq: Once | INTRAMUSCULAR | Status: AC
  Administered 2020-02-09: 0.5 mg via INTRAVENOUS
  Filled 2020-02-09: qty 1

## 2020-02-09 NOTE — ED Notes (Signed)
ED Provider at bedside. 

## 2020-02-09 NOTE — ED Notes (Signed)
Pt being transported to CT

## 2020-02-09 NOTE — ED Triage Notes (Signed)
Patient with L sided flank pain and nausea that started at 0300 today. States "pain let off then started again."

## 2020-02-09 NOTE — ED Provider Notes (Signed)
[facility] EMERGENCY DEPARTMENT Provider Note   CSN: [id number] Arrival date & time: 02/09/20  1731     History Chief Complaint  Patient presents with  . Flank Pain    Zachary [name] is a 74 y.o. male with a history of prior nephrolithiasis, bladder cancer s/p multiple tumor resections, anxiety, depression, parkinsonism, hypertension, hyperlipidemia, OSA, and anemia, who presents to the ED with complaints of intermittent left flank pain since 0300 this AM. Patient states pain initially woke him from sleep but then eased off, returned throughout the day today and has been waxing/waning since. Pain is located in left flank, radiates into the lower abdomen, currently an 8/10 in severity, no specific alleviating/aggravating factors. Reports associated nausea w/ dry heaves. Last BM yesterday, typically goes daily, did take a laxative today and ate fruit, he often has hard stools that he has to eat fruit to help with. Denies fever, chills, dysuria, hematuria, urgency, testicular pain, chest pain, dyspnea, melena, or hematochezia. Patient is followed by urologist Dr. Gloriann Loan, states he has required prior surgical interventions for kidney stones in the past.   HPI     Past Medical History:  Diagnosis Date  . Anxiety    takes Valium as needed  . Arthritis   . Bladder cancer ([place])    takes Rapaflo daily  . Blood dyscrasia    07/08/16: pt told he was a "free bleeder" after bleeding a lot when derm cut off mole on forehead. No excessive bleeding with minor wounds at home, no bleeding problems perioperatively with prior surgeries.  . Chronic back pain    spondylolisthesis  . Cluster headaches   . Depression    takes Lexapro daily  . Essential hypertension, benign    takes Diovan-HCT daily  . Hx of complications due to general anesthesia    Aborted surgery 07/15/2016 due to hypotension per pt  . Joint pain   . Kidney stone 03/2019   passed stone  . Obstructive sleep apnea on CPAP    uses CPAP  @ night  . Parkinsonism ([place])   . Pneumonia    "several times"--last time about 3-4 yrs ago  . Prediabetes   . Thyroid condition     Patient Active Problem List   Diagnosis Date Noted  . Subclinical hyperthyroidism 09/20/2018  . Prediabetes 09/20/2018  . S/P total knee replacement, 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 02/07/2018  . Primary osteoarthritis of left knee 02/01/2018  . Postural hypotension 07/23/2016  . Dysphagia 07/23/2016  . Obstructive sleep apnea 07/21/2016  . Hyperglycemia 07/21/2016  . Anemia, unspecified 07/21/2016  . Spinal stenosis of lumbar region 07/15/2016  . Bladder tumor 07/27/2015  . Epiretinal membrane (ERM) of left eye 03/14/2014  . H/O retinal detachment 03/14/2014  . Pseudophakia of both eyes 03/14/2014  . Retinal detachment 03/14/2014  . Visual distortion 03/14/2014  . HNP (herniated nucleus pulposus), cervical 06/05/2013    Class: Diagnosis of  . 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  . Muscle spasms of neck 04/04/2013  . 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 OA (osteoarthritis) of knee 02/22/2013  . Iliotibial band syndrome of left side 11/22/2012  . Localized, primary osteoarthritis of the ankle and foot 10/11/2012  . Difficulty in walking(719.7) 08/17/2012  . Peroneal tendinitis 08/16/2012  . Precordial pain 02/09/2012  . Essential hypertension, benign 02/09/2012  . Mixed hyperlipidemia 02/09/2012  . Dysthymia 02/04/2012  . Abnormality of gait 12/23/2011  . Lateral epicondylitis/tennis elbow 05/06/2011  . TENOSYNOVITIS OF FOOT AND ANKLE 10/22/2010  Past Surgical History:  Procedure Laterality Date  . ANTERIOR CERVICAL DECOMP/DISCECTOMY FUSION N/A 06/05/2013   Procedure:  C3-4 Anterior Cervical Discectomy and Fusion, Allograft, Plate;  Surgeon: [name], MD;  Location: [place];  Service: Orthopedics;  Laterality: N/A;  C3-4 Anterior Cervical Discectomy and Fusion, Allograft, Plate  . Arthroscopic knee surgery    . BACK SURGERY      x 2  .  BLADDER SURGERY     x 5 to remove tumor  . CARPAL TUNNEL RELEASE    . CATARACT EXTRACTION W/PHACO  12/19/2012   Procedure: CATARACT EXTRACTION PHACO AND INTRAOCULAR LENS PLACEMENT (IOC);  Surgeon: [name], MD;  Location: [facility];  Service: Ophthalmology;  Laterality: Left;  CDE: 26.80  . CATARACT EXTRACTION W/PHACO  01/09/2013   Procedure: CATARACT EXTRACTION PHACO AND INTRAOCULAR LENS PLACEMENT (IOC);  Surgeon: [name], MD;  Location: [facility];  Service: Ophthalmology;  Laterality: Right;  CDE:22.83  . CERVICAL FUSION  06/05/2013   C 3  C4  . CYSTOSCOPY    . REFRACTIVE SURGERY     Hx: of  . RETINAL DETACHMENT SURGERY    . TOTAL KNEE ARTHROPLASTY Left 02/01/2018   Procedure: TOTAL KNEE ARTHROPLASTY;  Surgeon: Carole Civil, MD;  Location: [facility];  Service: Orthopedics;  Laterality: Left;  . TRANSURETHRAL RESECTION OF BLADDER TUMOR Right 07/27/2015   Procedure: TRANSURETHRAL RESECTION OF BLADDER TUMOR (TURBT);  Surgeon: [name], MD;  Location: [facility];  Service: Urology;  Laterality: Right;  . wisdom tooth extraction         Family History  Problem Relation Age of Onset  . Hypertension Father   . Cancer Father        Prostate  . Depression Mother   . Hypertension Mother   . Alcohol abuse Mother   . COPD Mother        passed 07-08-17  . Hypertension Sister   . Anxiety disorder Sister   . Alcohol abuse Maternal Grandfather   . ADD / ADHD Neg Hx   . Bipolar disorder Neg Hx   . Dementia Neg Hx   . Drug abuse Neg Hx   . OCD Neg Hx   . Paranoid behavior Neg Hx   . Schizophrenia Neg Hx   . Seizures Neg Hx   . Sexual abuse Neg Hx   . Physical abuse Neg Hx     Social History   Tobacco Use  . Smoking status: Former Smoker    Quit date: 12/30/1976    Years since quitting: 43.1  . Smokeless tobacco: Never Used  . Tobacco comment: 1985  Substance Use Topics  . Alcohol use: No    Comment: quit 1990  . Drug use: No    Home Medications Prior to Admission  medications   Medication Sig Start Date End Date Taking? Authorizing Provider  acetaminophen (TYLENOL) 500 MG tablet Take 1,000 mg by mouth at bedtime as needed (for pain.).     [provider]  carbidopa-levodopa (SINEMET IR) 25-100 MG tablet Take 1 tablet by mouth 3 (three) times daily before meals. 10/10/19   Penumalli, Earlean Polka, MD  diazepam (VALIUM) 5 MG tablet Take 1 tablet (5 mg total) by mouth daily as needed for anxiety. for anxiety 08/07/19   Cloria Spring, MD  DULoxetine (CYMBALTA) 60 MG capsule Take 1 capsule (60 mg total) by mouth daily. 08/07/19   Cloria Spring, MD  methimazole (TAPAZOLE) 5 MG tablet Take 5 mg by mouth  every morning.  02/28/19   [provider]  tamsulosin (FLOMAX) 0.4 MG CAPS capsule Take 1 capsule (0.4 mg total) by mouth daily. 04/24/19   Triplett, Tammy, PA-C  valsartan-hydrochlorothiazide (DIOVAN-HCT) 320-25 MG tablet TK 1 T PO QD 08/07/19   [provider]    Allergies    Sulfonamide derivatives  Review of Systems   Review of Systems  Constitutional: Negative for chills and fever.  Respiratory: Negative for shortness of breath.   Cardiovascular: Negative for chest pain.  Gastrointestinal: Positive for abdominal pain, constipation (last BM yesterday), nausea and vomiting (dry heaves). Negative for anal bleeding, blood in stool and diarrhea.  Genitourinary: Positive for flank pain. Negative for dysuria, hematuria, penile swelling, scrotal swelling, testicular pain and urgency.  Neurological: Negative for weakness and numbness.  All other systems reviewed and are negative.   Physical Exam Updated Vital Signs BP (!) 161/85 (BP Location: Left Arm)   Pulse 95   Temp (!) 97.4 F (36.3 C) (Oral)   Resp 16   Ht 6\' 1"  (1.854 m)   Wt 103.4 kg   SpO2 96%   BMI 30.08 kg/m   Physical Exam Vitals and nursing note reviewed.  Constitutional:      General: He is not in acute distress.    Appearance: He is well-developed. He is not  toxic-appearing.  HENT:     Head: Normocephalic and atraumatic.  Eyes:     General:        Right eye: No discharge.        Left eye: No discharge.     Conjunctiva/sclera: Conjunctivae normal.  Cardiovascular:     Rate and Rhythm: Normal rate and regular rhythm.  Pulmonary:     Effort: Pulmonary effort is normal. No respiratory distress.     Breath sounds: Normal breath sounds. No wheezing, rhonchi or rales.  Abdominal:     General: There is no distension.     Palpations: Abdomen is soft.     Tenderness: There is no abdominal tenderness. There is no right CVA tenderness, left CVA tenderness, guarding or rebound.  Musculoskeletal:     Cervical back: Neck supple.  Skin:    General: Skin is warm and dry.     Findings: No rash.  Neurological:     General: No focal deficit present.     Mental Status: He is alert.     Comments: Clear speech.   Psychiatric:        Behavior: Behavior normal.     ED Results / Procedures / Treatments   Labs (all labs ordered are listed, but only abnormal results are displayed) Labs Reviewed  URINALYSIS, ROUTINE W REFLEX MICROSCOPIC - Abnormal; Notable for the following components:      Result Value   Hgb urine dipstick LARGE (*)    RBC / HPF >50 (*)    All other components within normal limits  COMPREHENSIVE METABOLIC PANEL - Abnormal; Notable for the following components:   Glucose, Bld 111 (*)    BUN 26 (*)    Creatinine, Ser 1.43 (*)    Calcium 8.7 (*)    GFR calc non Af Amer 48 (*)    GFR calc Af Amer 56 (*)    All other components within normal limits  CBC WITH DIFFERENTIAL/PLATELET - Abnormal; Notable for the following components:   WBC 14.4 (*)    Neutro Abs 11.9 (*)    All other components within normal limits  URINE CULTURE  EKG None  Radiology CT Renal Stone Study  Result Date: 02/09/2020 CLINICAL DATA:  Left-sided flank pain, nausea since 3 a.m. EXAM: CT ABDOMEN AND PELVIS WITHOUT CONTRAST TECHNIQUE: Multidetector CT  imaging of the abdomen and pelvis was performed following the standard protocol without IV contrast. COMPARISON:  04/24/2019 FINDINGS: Lower chest: No acute pleural or parenchymal lung disease. Hepatobiliary: No focal liver abnormality is seen. No gallstones, gallbladder wall thickening, or biliary dilatation. Pancreas: Unremarkable. No pancreatic ductal dilatation or surrounding inflammatory changes. Spleen: Normal in size without focal abnormality. Adrenals/Urinary Tract: There are multiple bilateral nonobstructing renal calculi. Largest in the left kidney measures 15 mm on image 32, and in the right kidney measures 11 mm on image 40. No evidence of right-sided obstruction. On the left, there is an obstructing proximal left ureteral calculus measuring 6 mm on image 47. Mild to moderate left-sided hydronephrosis. The bladder is unremarkable.  The adrenals are normal. Stomach/Bowel: Stomach is within normal limits. Appendix appears normal. No evidence of bowel wall thickening, distention, or inflammatory changes. Diverticulosis of the descending and sigmoid colon without diverticulitis. Vascular/Lymphatic: Aortic atherosclerosis. No enlarged abdominal or pelvic lymph nodes. Reproductive: Prostate is unremarkable. Other: Stable fat containing umbilical hernia. No free fluid or free gas. Musculoskeletal: No acute or destructive bony lesions. Postsurgical changes are seen from L3 through S1 with laminectomy, posterior fusion, discectomy. Reconstructed images demonstrate no additional findings. IMPRESSION: 1. Left-sided obstructive uropathy related to an obstructing 6 mm proximal left ureteral calculus. 2. Other bilateral nonobstructing renal calculi. 3. Distal colonic diverticulosis without diverticulitis.  The Electronically Signed   By: Randa Ngo M.D.   On: 02/09/2020 18:56    Procedures Procedures (including critical care time)  Medications Ordered in ED Medications  sodium chloride 0.9 % bolus 500 mL  (0 mLs Intravenous Stopped 02/09/20 2009)  ondansetron (ZOFRAN) injection 4 mg (4 mg Intravenous Given 02/09/20 1826)  morphine 4 MG/ML injection 4 mg (4 mg Intravenous Given 02/09/20 1826)  HYDROmorphone (DILAUDID) injection 0.5 mg (0.5 mg Intravenous Given 02/09/20 1912)    ED Course  I have reviewed the triage vital signs and the nursing notes.  Pertinent labs & imaging results that were available during my care of the patient were reviewed by me and considered in my medical decision making (see chart for details).    MDM Rules/Calculators/A&P                      Patient presents to the ED with complaints of L flank pain with associated nausea & dry heaves. Nontoxic, vitals notable for elevated BP- doubt HTN emergency. No abdominal or CVA tenderness, no peritoneal signs. DDX: Nephrolithiasis, pyelo, bowel obstruction/perf, musculoskeletal, dissection. Analgesics, anti-emetics, & fludis ordered.   Labs reviewed & compared to prior on chart review.  CBC: Leukocytosis @ 14.4. No anemia.  CMP: Mildly elevated creatinine @ 1.43- most recently ranging 1.19-1.32. LFTs WNL. No significant electrolyte derangement.  UA: Hematuria, no signs of superimposed infection.   CT renal stone study:  1. Left-sided obstructive uropathy related to an obstructing 6 mm proximal left ureteral calculus. 2. Other bilateral nonobstructing renal calculi. 3. Distal colonic diverticulosis without diverticulitis.--> I have reviewed CT imaging & am in agreement with radiology interpretation.   19:00: RE-EVAL: Pain improved to 3/10 in severity, still has some discomfort, dilaudid ordered.   Patient woken from sleep for re-assessment, feeling improved, able to tolerate PO. Will discharge home with analgesics, anti-emetics, flomax, & follow up with his urologist.  Strict return precautions. I discussed results, treatment plan, need for follow-up, and return precautions with the patient. Provided opportunity for questions,  patient confirmed understanding and is in agreement with plan.   Findings and plan of care discussed with supervising physician Dr. Roderic Palau who has evaluated patient & in agreement.    Final Clinical Impression(s) / ED Diagnoses Final diagnoses:  Kidney stone    Rx / DC Orders ED Discharge Orders         Ordered    oxyCODONE-acetaminophen (PERCOCET/ROXICET) 5-325 MG tablet  Every 6 hours PRN     02/09/20 2026    ondansetron (ZOFRAN ODT) 4 MG disintegrating tablet  Every 8 hours PRN     02/09/20 2026    tamsulosin (FLOMAX) 0.4 MG CAPS capsule  Daily after breakfast     02/09/20 2026           Raevon Broom, Glynda Jaeger, PA-C 02/09/20 2057    Milton Ferguson, MD 02/11/20 [phone]

## 2020-02-09 NOTE — Discharge Instructions (Addendum)
You were seen in the emergency department and found to have a kidney stone that is 72mm on the left side.  We are sending you home with multiple medications to assist with passing the stone:  -Flomax-this is a medication to help pass the stone, it allows urine to exit the body more freely.  Please take this once daily with a meal.--> please discuss this medication with the pharmacist specifically regarding interaction with other medicines.   -Percocet-this is a narcotic/controlled substance medication that has potential addicting qualities.  We recommend that you take 1-2 tablets every 6 hours as needed for severe pain.  Do not drive or operate heavy machinery when taking this medicine as it can be sedating. Do not drink alcohol or take other sedating medications when taking this medicine for safety reasons.  Keep this out of reach of small children.  Please be aware this medicine has Tylenol in it (325 mg/tab) do not exceed the maximum dose of Tylenol in a day per over the counter recommendations should you decide to supplement with Tylenol over the counter. This medication can constipate you, please take miralax per over the counter dosing to help with this.   -Zofran-this is an antinausea medication, you may take this every 8 hours as needed for nausea and vomiting, please allow the tablet to dissolve underneath of your tongue.   We have prescribed you new medication(s) today. Discuss the medications prescribed today with your pharmacist as they can have adverse effects and interactions with your other medicines including over the counter and prescribed medications. Seek medical evaluation if you start to experience new or abnormal symptoms after taking one of these medicines, seek care immediately if you start to experience difficulty breathing, feeling of your throat closing, facial swelling, or rash as these could be indications of a more serious allergic reaction  Your kidney function was abnormal in  the ER. Do not take NSAIDs (ibuprofen, Aleve, Advil, Motrin, Goody powder, Mobic, naproxen, etc.).   Please follow-up with Dr. Gloriann Loan, your urologist, within 1-3 days. Return to the ER, go to [facility] emergency department, for new or worsening symptoms including but not limited to worsening pain not controlled by these medicines, inability to keep fluids down, fever, or any other concerns that you may have.

## 2020-02-11 ENCOUNTER — Emergency Department (HOSPITAL_COMMUNITY): Payer: PPO

## 2020-02-11 ENCOUNTER — Other Ambulatory Visit: Payer: Self-pay

## 2020-02-11 ENCOUNTER — Emergency Department (HOSPITAL_COMMUNITY)
Admission: EM | Admit: 2020-02-11 | Discharge: 2020-02-11 | Disposition: A | Discharge status: Home / Self Care | Payer: PPO | Attending: Emergency Medicine | Admitting: Emergency Medicine

## 2020-02-11 ENCOUNTER — Encounter (HOSPITAL_COMMUNITY): Payer: Self-pay

## 2020-02-11 DIAGNOSIS — N200 Calculus of kidney: Secondary | ICD-10-CM | POA: Insufficient documentation

## 2020-02-11 DIAGNOSIS — Z5329 Procedure and treatment not carried out because of patient's decision for other reasons: Secondary | ICD-10-CM | POA: Diagnosis not present

## 2020-02-11 DIAGNOSIS — R1032 Left lower quadrant pain: Secondary | ICD-10-CM | POA: Insufficient documentation

## 2020-02-11 DIAGNOSIS — N201 Calculus of ureter: Secondary | ICD-10-CM | POA: Diagnosis not present

## 2020-02-11 DIAGNOSIS — R109 Unspecified abdominal pain: Secondary | ICD-10-CM | POA: Diagnosis not present

## 2020-02-11 LAB — URINALYSIS, ROUTINE W REFLEX MICROSCOPIC
Bilirubin Urine: NEGATIVE
Glucose, UA: NEGATIVE mg/dL
Ketones, ur: NEGATIVE mg/dL
Leukocytes,Ua: NEGATIVE
Nitrite: NEGATIVE
Protein, ur: NEGATIVE mg/dL
RBC / HPF: >50 RBC/hpf — ABNORMAL HIGH (ref 0–5)
Specific Gravity, Urine: 1.017 (ref 1.005–1.030)
pH: 5 (ref 5.0–8.0)

## 2020-02-11 LAB — URINE CULTURE

## 2020-02-11 NOTE — ED Triage Notes (Signed)
Pt was seen and diagnosed on the 2/12 for a kidney stone. Pt reports that he has experienced increased pain today, and reports not having a BM since Friday. Pt reports he has not passed the stone yet.

## 2020-02-11 NOTE — ED Notes (Signed)
This RN attempted to draw labs on patient. Upon explaining process to patient, he became verbally aggressive and states, "Why do I need labs collected when I just had them done Friday?" This RN explained to patient that this was what the MD ordered and that I could ask MD to come talk to patient. Patient refused to speak to MD about this matter. Patient continuously was rude and verbally aggressive while I attempted to draw labs. This RN explained to patient that I do not feel comfortably to stick patient again if he continues to be rude. Patient expressed that he would like to leave ED without labs drawn.

## 2020-02-11 NOTE — ED Provider Notes (Signed)
[facility] DEPT Provider Note   CSN: [id number] Arrival date & time: 02/11/20  1737     History Chief Complaint  Patient presents with   Nephrolithiasis    Zachary [name] is a 74 y.o. male.  Patient with history of kidney stones, bladder cancer status post multiple resections, hypertension and sleep apnea here with recurrent left-sided flank and low back pain.  He was seen in the ED on February 12 and diagnosed with a kidney stone of 6 mm that was proximal.  He was discharged with oxycodone, Flomax and Zofran.  He states he comes in because he is having worsening pain and is not able to sleep.  He states the pain is worse when he tries to stay still but is better when he moves around.  Patient states the pain has moved somewhat lower in his back.  He does have chronic back pain with previous back surgeries. No focal weakness, numbness, tingling. No bowel or bladder incontinence. No vomiting or fever.  Still able to urinate.  Denies testicular pain.  Denies abdominal pain. Apparently his wife called someone at the urology office and was told to come to the ED. patient states he does not like the oxycodone it is makes him "feel like a clown". He states he does not want anything here for pain including morphine, fentanyl or Dilaudid.  The history is provided by the patient.       Past Medical History:  Diagnosis Date   Anxiety    takes Valium as needed   Arthritis    Bladder cancer ([place])    takes Rapaflo daily   Blood dyscrasia    07/08/16: pt told he was a "free bleeder" after bleeding a lot when derm cut off mole on forehead. No excessive bleeding with minor wounds at home, no bleeding problems perioperatively with prior surgeries.   Chronic back pain    spondylolisthesis   Cluster headaches    Depression    takes Lexapro daily   Essential hypertension, benign    takes Diovan-HCT daily   Hx of complications due to general anesthesia    Aborted surgery 07/15/2016 due to hypotension per pt   Joint pain    Kidney stone 03/2019   passed stone   Obstructive sleep apnea on CPAP    uses CPAP @ night   Parkinsonism ([place])    Pneumonia    "several times"--last time about 3-4 yrs ago   Prediabetes    Thyroid condition     Patient Active Problem List   Diagnosis Date Noted   Subclinical hyperthyroidism 09/20/2018   Prediabetes 09/20/2018   S/P total knee replacement, 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 02/07/2018   Primary osteoarthritis of left knee 02/01/2018   Postural hypotension 07/23/2016   Dysphagia 07/23/2016   Obstructive sleep apnea 07/21/2016   Hyperglycemia 07/21/2016   Anemia, unspecified 07/21/2016   Spinal stenosis of lumbar region 07/15/2016   Bladder tumor 07/27/2015   Epiretinal membrane (ERM) of left eye 03/14/2014   H/O retinal detachment 03/14/2014   Pseudophakia of both eyes 03/14/2014   Retinal detachment 03/14/2014   Visual distortion 03/14/2014   HNP (herniated nucleus pulposus), cervical 06/05/2013    Class: Diagnosis of   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   Muscle spasms of neck 04/04/2013   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 anxiety 03/01/2013   OA (osteoarthritis) of knee 02/22/2013   Iliotibial band syndrome of left side 11/22/2012   Localized, primary osteoarthritis of the ankle and foot 10/11/2012  Difficulty in walking(719.7) 08/17/2012   Peroneal tendinitis 08/16/2012   Precordial pain 02/09/2012   Essential hypertension, benign 02/09/2012   Mixed hyperlipidemia 02/09/2012   Dysthymia 02/04/2012   Abnormality of gait 12/23/2011   Lateral epicondylitis/tennis elbow 05/06/2011   TENOSYNOVITIS OF FOOT AND ANKLE 10/22/2010    Past Surgical History:  Procedure Laterality Date   ANTERIOR CERVICAL DECOMP/DISCECTOMY FUSION N/A 06/05/2013   Procedure:  C3-4 Anterior Cervical Discectomy and Fusion, Allograft, Plate;  Surgeon: [name], MD;  Location: [facility];  Service:  Orthopedics;  Laterality: N/A;  C3-4 Anterior Cervical Discectomy and Fusion, Allograft, Plate   Arthroscopic knee surgery     BACK SURGERY      x 2   BLADDER SURGERY     x 5 to remove tumor   CARPAL TUNNEL RELEASE     CATARACT EXTRACTION W/PHACO  12/19/2012   Procedure: CATARACT EXTRACTION PHACO AND INTRAOCULAR LENS PLACEMENT ([place]);  Surgeon: [name], MD;  Location: [facility];  Service: Ophthalmology;  Laterality: Left;  CDE: 26.80   CATARACT EXTRACTION W/PHACO  01/09/2013   Procedure: CATARACT EXTRACTION PHACO AND INTRAOCULAR LENS PLACEMENT ([facility]);  Surgeon: [name], MD;  Location: [facility];  Service: Ophthalmology;  Laterality: Right;  CDE:22.83   CERVICAL FUSION  06/05/2013   C 3  C4   CYSTOSCOPY     REFRACTIVE SURGERY     Hx: of   RETINAL DETACHMENT SURGERY     TOTAL KNEE ARTHROPLASTY Left 02/01/2018   Procedure: TOTAL KNEE ARTHROPLASTY;  Surgeon: Carole Civil, MD;  Location: [facility];  Service: Orthopedics;  Laterality: Left;   TRANSURETHRAL RESECTION OF BLADDER TUMOR Right 07/27/2015   Procedure: TRANSURETHRAL RESECTION OF BLADDER TUMOR (TURBT);  Surgeon: [name], MD;  Location: [facility];  Service: Urology;  Laterality: Right;   wisdom tooth extraction         Family History  Problem Relation Age of Onset   Hypertension Father    Cancer Father        Prostate   Depression Mother    Hypertension Mother    Alcohol abuse Mother    COPD Mother        passed 07-08-17   Hypertension Sister    Anxiety disorder Sister    Alcohol abuse Maternal Grandfather    ADD / ADHD Neg Hx    Bipolar disorder Neg Hx    Dementia Neg Hx    Drug abuse Neg Hx    OCD Neg Hx    Paranoid behavior Neg Hx    Schizophrenia Neg Hx    Seizures Neg Hx    Sexual abuse Neg Hx    Physical abuse Neg Hx     Social History   Tobacco Use   Smoking status: Former Smoker    Quit date: 12/30/1976    Years since quitting: 43.1   Smokeless tobacco: Never Used     Tobacco comment: 1985  Substance Use Topics   Alcohol use: No    Comment: quit 1990   Drug use: No    Home Medications Prior to Admission medications   Medication Sig Start Date End Date Taking? Authorizing Provider  acetaminophen (TYLENOL) 500 MG tablet Take 1,000 mg by mouth at bedtime as needed (for pain.).     [provider]  carbidopa-levodopa (SINEMET IR) 25-100 MG tablet Take 1 tablet by mouth 3 (three) times daily before meals. 10/10/19   Penumalli, Earlean Polka, MD  diazepam (VALIUM) 5 MG tablet Take 1  tablet (5 mg total) by mouth daily as needed for anxiety. for anxiety 08/07/19   Cloria Spring, MD  DULoxetine (CYMBALTA) 60 MG capsule Take 1 capsule (60 mg total) by mouth daily. 08/07/19   Cloria Spring, MD  methimazole (TAPAZOLE) 5 MG tablet Take 5 mg by mouth every morning.  02/28/19   [provider]  ondansetron (ZOFRAN ODT) 4 MG disintegrating tablet Take 1 tablet (4 mg total) by mouth every 8 (eight) hours as needed for nausea or vomiting. 02/09/20   Petrucelli, Aldona Bar R, PA-C  oxyCODONE-acetaminophen (PERCOCET/ROXICET) 5-325 MG tablet Take 1-2 tablets by mouth every 6 (six) hours as needed for severe pain. 02/09/20   Petrucelli, Samantha R, PA-C  pravastatin (PRAVACHOL) 10 MG tablet Take 10 mg by mouth at bedtime. 01/23/20   [provider]  tamsulosin (FLOMAX) 0.4 MG CAPS capsule Take 1 capsule (0.4 mg total) by mouth daily after breakfast. 02/09/20   Petrucelli, Samantha R, PA-C  valsartan-hydrochlorothiazide (DIOVAN-HCT) 320-25 MG tablet Take 1 tablet by mouth daily.  08/07/19   [provider]    Allergies    Sulfonamide derivatives  Review of Systems   Review of Systems  Constitutional: Negative for activity change, appetite change and fever.  HENT: Negative for congestion and rhinorrhea.   Eyes: Negative for visual disturbance.  Respiratory: Negative for cough, chest tightness and shortness of breath.   Cardiovascular: Negative  for chest pain.  Gastrointestinal: Negative for abdominal pain, nausea and vomiting.  Genitourinary: Positive for dysuria, flank pain and frequency. Negative for hematuria and testicular pain.  Musculoskeletal: Positive for back pain.  Skin: Negative for rash.  Neurological: Negative for dizziness, weakness and headaches.   all other systems are negative except as noted in the HPI and PMH.    Physical Exam Updated Vital Signs BP 140/79    Pulse 99    Temp 98.3 F (36.8 C)    Resp 18    SpO2 99%   Physical Exam Vitals and nursing note reviewed.  Constitutional:      General: He is not in acute distress.    Appearance: He is well-developed.     Comments: No distress  HENT:     Head: Normocephalic and atraumatic.     Mouth/Throat:     Pharynx: No oropharyngeal exudate.  Eyes:     Conjunctiva/sclera: Conjunctivae normal.     Pupils: Pupils are equal, round, and reactive to light.  Neck:     Comments: No meningismus. Cardiovascular:     Rate and Rhythm: Normal rate and regular rhythm.     Heart sounds: Normal heart sounds. No murmur.  Pulmonary:     Effort: Pulmonary effort is normal. No respiratory distress.     Breath sounds: Normal breath sounds.  Chest:     Chest wall: No tenderness.  Abdominal:     Palpations: Abdomen is soft.     Tenderness: There is no abdominal tenderness. There is no guarding or rebound.  Musculoskeletal:        General: Tenderness present. Normal range of motion.     Cervical back: Normal range of motion and neck supple.     Comments: Lumbar surgical scar appears well-healed. Left paraspinal lumbar and SI joint tenderness.  5/5 strength in bilateral lower extremities. Ankle plantar and dorsiflexion intact. Great toe extension intact bilaterally. +2 DP and PT pulses. +2 patellar reflexes bilaterally. Normal gait.   Skin:    General: Skin is warm.  Neurological:  Mental Status: He is alert and oriented to person, place, and time.     Cranial  Nerves: No cranial nerve deficit.     Motor: No abnormal muscle tone.     Coordination: Coordination normal.     Comments: No ataxia on finger to nose bilaterally. No pronator drift. 5/5 strength throughout. CN 2-12 intact.Equal grip strength. Sensation intact.   Psychiatric:        Behavior: Behavior normal.     ED Results / Procedures / Treatments   Labs (all labs ordered are listed, but only abnormal results are displayed) Labs Reviewed  URINE CULTURE  URINALYSIS, ROUTINE W REFLEX MICROSCOPIC  CBC WITH DIFFERENTIAL/PLATELET  BASIC METABOLIC PANEL    EKG None  Radiology DG Abdomen Acute W/Chest  Result Date: 02/11/2020 CLINICAL DATA:  Known LEFT ureteral stone. Worsening pain today. EXAM: DG ABDOMEN ACUTE W/ 1V CHEST COMPARISON:  Plain film of the abdomen dated 05/12/2019. CT abdomen dated 02/08/2019. FINDINGS: Single-view of the chest: Heart size and mediastinal contours are within normal limits. Lungs are clear. No pleural effusion is seen. Osseous structures about the chest are unremarkable. Upright and supine views of the abdomen: The LEFT ureteral stone seen on recent CT abdomen is not visualized by plain film. Bowel gas pattern is nonobstructive. No evidence of free intraperitoneal air. No acute appearing osseous abnormality. Metallic fixation hardware in place within the lower lumbar spine. IMPRESSION: 1. LEFT ureteral stone seen on recent CT abdomen is not able to be visualized by plain film. 2. No evidence of acute cardiopulmonary abnormality. Lungs are clear. 3. Nonobstructive bowel gas pattern. Electronically Signed   By: Franki Cabot M.D.   On: 02/11/2020 18:39    Procedures Procedures (including critical care time)  Medications Ordered in ED Medications - No data to display  ED Course  I have reviewed the triage vital signs and the nursing notes.  Pertinent labs & imaging results that were available during my care of the patient were reviewed by me and considered  in my medical decision making (see chart for details).    MDM Rules/Calculators/A&P                     Patient returns with recurrent flank pain with known kidney stone.  States the pain has moved somewhat inferiorly compared to 2 days ago.  States the pain medicine makes him "feel like a clown".  He is able to urinate and denies any fever or vomiting.  Intact strength, sensation, reflexes.  Low suspicion for cord compression or cauda equina.  No fever or vomiting.  Urinalysis shows hematuria without evidence of infection.  Culture is sent.  X-ray does not show a kidney stone visible today. Explained to the patient that lab work was necessary to evaluate his kidney function. Patient states he does not want any pain medicine because he does not like the side effects and the way it makes him feel.  Patient apparently had an argument with the nurse over the ordered lab work and eloped from the ED.  I was not informed of this until patient had already left.  Final Clinical Impression(s) / ED Diagnoses Final diagnoses:  Left flank pain    Rx / DC Orders ED Discharge Orders    None       Lainy Wrobleski, Annie Main, MD 02/11/20 1932

## 2020-02-11 NOTE — ED Notes (Signed)
Pt ambulatory from waiting room to triage, A&Ox4 answering all questions appropriately. Wife barged back to triage room 1 when pt was called back for triage. Wife refusing to leave pt. Wife stated multiple times that she refuses to sit in her car. Writer of this note explained to wife that pt does not meet ALONE criteria. Wife continues to states that she will not go outside, that the policy can have exceptions for her to not go and wait in the car. Charge RN Patty called to triage to review policy with pt. Security called due to wife still refusing to wait in car. Pt was taken back to room while security and Charge RN spoke with wife. Charge RN contacted [facility], and [facility]- ED Director regarding situation. Pt wife is to wait out in her car until discharged. Wife was escorted outside by security and [employer] upon hearing that [employer] car was on the way. Wife voluntarily went outside to wait in car.

## 2020-02-12 ENCOUNTER — Inpatient Hospital Stay (HOSPITAL_COMMUNITY): Payer: PPO

## 2020-02-12 ENCOUNTER — Inpatient Hospital Stay (HOSPITAL_COMMUNITY): Payer: PPO | Admitting: Certified Registered Nurse Anesthetist

## 2020-02-12 ENCOUNTER — Encounter (HOSPITAL_COMMUNITY): Payer: Self-pay | Admitting: Urology

## 2020-02-12 ENCOUNTER — Other Ambulatory Visit: Payer: Self-pay

## 2020-02-12 ENCOUNTER — Encounter (HOSPITAL_COMMUNITY)
Admission: RE | Disposition: A | Discharge status: Home / Self Care | Payer: Self-pay | Source: Home / Self Care | Attending: Urology

## 2020-02-12 ENCOUNTER — Observation Stay (HOSPITAL_COMMUNITY)
Admission: RE | Admit: 2020-02-12 | Discharge: 2020-02-13 | Disposition: A | Discharge status: Home / Self Care | Payer: PPO | Attending: Urology | Admitting: Urology

## 2020-02-12 DIAGNOSIS — Z6829 Body mass index (BMI) 29.0-29.9, adult: Secondary | ICD-10-CM | POA: Diagnosis not present

## 2020-02-12 DIAGNOSIS — E782 Mixed hyperlipidemia: Secondary | ICD-10-CM | POA: Diagnosis not present

## 2020-02-12 DIAGNOSIS — Z8551 Personal history of malignant neoplasm of bladder: Secondary | ICD-10-CM | POA: Diagnosis not present

## 2020-02-12 DIAGNOSIS — Z79899 Other long term (current) drug therapy: Secondary | ICD-10-CM | POA: Insufficient documentation

## 2020-02-12 DIAGNOSIS — N529 Male erectile dysfunction, unspecified: Secondary | ICD-10-CM | POA: Insufficient documentation

## 2020-02-12 DIAGNOSIS — G20 Parkinson's disease: Secondary | ICD-10-CM | POA: Diagnosis not present

## 2020-02-12 DIAGNOSIS — N201 Calculus of ureter: Secondary | ICD-10-CM | POA: Diagnosis present

## 2020-02-12 DIAGNOSIS — I10 Essential (primary) hypertension: Secondary | ICD-10-CM | POA: Diagnosis not present

## 2020-02-12 DIAGNOSIS — E669 Obesity, unspecified: Secondary | ICD-10-CM | POA: Diagnosis not present

## 2020-02-12 DIAGNOSIS — N202 Calculus of kidney with calculus of ureter: Secondary | ICD-10-CM | POA: Diagnosis not present

## 2020-02-12 DIAGNOSIS — Z87891 Personal history of nicotine dependence: Secondary | ICD-10-CM | POA: Insufficient documentation

## 2020-02-12 DIAGNOSIS — L03314 Cellulitis of groin: Secondary | ICD-10-CM | POA: Diagnosis not present

## 2020-02-12 DIAGNOSIS — N132 Hydronephrosis with renal and ureteral calculous obstruction: Principal | ICD-10-CM | POA: Insufficient documentation

## 2020-02-12 DIAGNOSIS — Z20822 Contact with and (suspected) exposure to covid-19: Secondary | ICD-10-CM | POA: Diagnosis not present

## 2020-02-12 HISTORY — PX: CYSTOSCOPY W/ URETERAL STENT PLACEMENT: SHX1429

## 2020-02-12 LAB — URINE CULTURE: Culture: NO GROWTH

## 2020-02-12 LAB — RESPIRATORY PANEL BY RT PCR (FLU A&B, COVID)
Influenza A by PCR: NEGATIVE
Influenza B by PCR: NEGATIVE
SARS Coronavirus 2 by RT PCR: NEGATIVE

## 2020-02-12 LAB — GLUCOSE, CAPILLARY: Glucose-Capillary: 94 mg/dL (ref 70–99)

## 2020-02-12 SURGERY — CYSTOSCOPY, WITH RETROGRADE PYELOGRAM AND URETERAL STENT INSERTION
Anesthesia: General | Site: Ureter | Laterality: Bilateral

## 2020-02-12 MED ORDER — DULOXETINE HCL 60 MG PO CPEP
60.0000 mg | ORAL_CAPSULE | Freq: Every day | ORAL | Status: DC
  Administered 2020-02-13: 60 mg via ORAL
  Filled 2020-02-12: qty 1

## 2020-02-12 MED ORDER — SODIUM CHLORIDE 0.9 % IV SOLN
2.0000 g | Freq: Once | INTRAVENOUS | Status: AC
  Administered 2020-02-12: 2 g via INTRAVENOUS

## 2020-02-12 MED ORDER — FENTANYL CITRATE (PF) 100 MCG/2ML IJ SOLN
INTRAMUSCULAR | Status: DC | PRN
  Administered 2020-02-12: 100 ug via INTRAVENOUS

## 2020-02-12 MED ORDER — PHENYLEPHRINE 40 MCG/ML (10ML) SYRINGE FOR IV PUSH (FOR BLOOD PRESSURE SUPPORT)
PREFILLED_SYRINGE | INTRAVENOUS | Status: DC | PRN
  Administered 2020-02-12 (×2): 120 ug via INTRAVENOUS

## 2020-02-12 MED ORDER — ONDANSETRON HCL 4 MG/2ML IJ SOLN: 4.0000 mg | Freq: Once | INTRAMUSCULAR | Status: DC | PRN

## 2020-02-12 MED ORDER — TAMSULOSIN HCL 0.4 MG PO CAPS
0.4000 mg | ORAL_CAPSULE | Freq: Every day | ORAL | Status: DC
  Administered 2020-02-13: 08:00:00 0.4 mg via ORAL
  Filled 2020-02-12: qty 1

## 2020-02-12 MED ORDER — SODIUM CHLORIDE 0.9 % IV SOLN
1.0000 g | INTRAVENOUS | Status: DC
  Filled 2020-02-12: qty 10

## 2020-02-12 MED ORDER — IOHEXOL 300 MG/ML  SOLN
INTRAMUSCULAR | Status: DC | PRN
  Administered 2020-02-12: 50 mL

## 2020-02-12 MED ORDER — SENNOSIDES-DOCUSATE SODIUM 8.6-50 MG PO TABS: 1.0000 | ORAL_TABLET | Freq: Every evening | ORAL | Status: DC | PRN

## 2020-02-12 MED ORDER — ONDANSETRON HCL 4 MG/2ML IJ SOLN
INTRAMUSCULAR | Status: DC | PRN
  Administered 2020-02-12: 4 mg via INTRAVENOUS

## 2020-02-12 MED ORDER — BISACODYL 10 MG RE SUPP: 10.0000 mg | Freq: Every day | RECTAL | Status: DC | PRN

## 2020-02-12 MED ORDER — DEXAMETHASONE SODIUM PHOSPHATE 4 MG/ML IJ SOLN
INTRAMUSCULAR | Status: DC | PRN
  Administered 2020-02-12: 10 mg via INTRAVENOUS

## 2020-02-12 MED ORDER — DIPHENHYDRAMINE HCL 50 MG/ML IJ SOLN: 12.5000 mg | Freq: Four times a day (QID) | INTRAMUSCULAR | Status: DC | PRN

## 2020-02-12 MED ORDER — LACTATED RINGERS IV SOLN: INTRAVENOUS | Status: DC

## 2020-02-12 MED ORDER — OXYCODONE HCL 5 MG/5ML PO SOLN: 5.0000 mg | Freq: Once | ORAL | Status: DC | PRN

## 2020-02-12 MED ORDER — VALSARTAN-HYDROCHLOROTHIAZIDE 320-25 MG PO TABS: 1.0000 | ORAL_TABLET | Freq: Every day | ORAL | Status: DC

## 2020-02-12 MED ORDER — DIAZEPAM 5 MG PO TABS: 5.0000 mg | ORAL_TABLET | Freq: Every day | ORAL | Status: DC | PRN

## 2020-02-12 MED ORDER — FENTANYL CITRATE (PF) 100 MCG/2ML IJ SOLN: 25.0000 ug | INTRAMUSCULAR | Status: DC | PRN

## 2020-02-12 MED ORDER — HYDROCHLOROTHIAZIDE 25 MG PO TABS
25.0000 mg | ORAL_TABLET | Freq: Every day | ORAL | Status: DC
  Administered 2020-02-13: 09:00:00 25 mg via ORAL
  Filled 2020-02-12: qty 1

## 2020-02-12 MED ORDER — SODIUM CHLORIDE 0.9 % IV SOLN
INTRAVENOUS | Status: AC
  Filled 2020-02-12: qty 20

## 2020-02-12 MED ORDER — HYDROMORPHONE HCL 1 MG/ML IJ SOLN: 0.5000 mg | INTRAMUSCULAR | Status: DC | PRN

## 2020-02-12 MED ORDER — DIPHENHYDRAMINE HCL 12.5 MG/5ML PO ELIX: 12.5000 mg | ORAL_SOLUTION | Freq: Four times a day (QID) | ORAL | Status: DC | PRN

## 2020-02-12 MED ORDER — IRBESARTAN 300 MG PO TABS
300.0000 mg | ORAL_TABLET | Freq: Every day | ORAL | Status: DC
  Administered 2020-02-13: 300 mg via ORAL
  Filled 2020-02-12: qty 1

## 2020-02-12 MED ORDER — METHIMAZOLE 5 MG PO TABS
5.0000 mg | ORAL_TABLET | Freq: Every morning | ORAL | Status: DC
  Administered 2020-02-13: 5 mg via ORAL
  Filled 2020-02-12: qty 1

## 2020-02-12 MED ORDER — ACETAMINOPHEN 325 MG PO TABS
650.0000 mg | ORAL_TABLET | ORAL | Status: DC | PRN
  Administered 2020-02-12: 650 mg via ORAL
  Filled 2020-02-12: qty 2

## 2020-02-12 MED ORDER — PRAVASTATIN SODIUM 20 MG PO TABS
10.0000 mg | ORAL_TABLET | Freq: Every day | ORAL | Status: DC
  Administered 2020-02-12: 10 mg via ORAL
  Filled 2020-02-12: qty 1

## 2020-02-12 MED ORDER — PROPOFOL 10 MG/ML IV BOLUS
INTRAVENOUS | Status: AC
  Filled 2020-02-12: qty 20

## 2020-02-12 MED ORDER — LIDOCAINE 2% (20 MG/ML) 5 ML SYRINGE
INTRAMUSCULAR | Status: DC | PRN
  Administered 2020-02-12: 100 mg via INTRAVENOUS

## 2020-02-12 MED ORDER — FLEET ENEMA 7-19 GM/118ML RE ENEM: 1.0000 | ENEMA | Freq: Once | RECTAL | Status: DC | PRN

## 2020-02-12 MED ORDER — FENTANYL CITRATE (PF) 100 MCG/2ML IJ SOLN
INTRAMUSCULAR | Status: AC
  Filled 2020-02-12: qty 2

## 2020-02-12 MED ORDER — HYOSCYAMINE SULFATE 0.125 MG SL SUBL
0.1250 mg | SUBLINGUAL_TABLET | SUBLINGUAL | Status: DC | PRN
  Filled 2020-02-12: qty 1

## 2020-02-12 MED ORDER — PROPOFOL 10 MG/ML IV BOLUS
INTRAVENOUS | Status: DC | PRN
  Administered 2020-02-12: 150 mg via INTRAVENOUS

## 2020-02-12 MED ORDER — ZOLPIDEM TARTRATE 5 MG PO TABS: 5.0000 mg | ORAL_TABLET | Freq: Every evening | ORAL | Status: DC | PRN

## 2020-02-12 MED ORDER — POTASSIUM CHLORIDE IN NACL 20-0.45 MEQ/L-% IV SOLN
INTRAVENOUS | Status: DC
  Filled 2020-02-12 (×2): qty 1000

## 2020-02-12 MED ORDER — ONDANSETRON HCL 4 MG/2ML IJ SOLN: 4.0000 mg | INTRAMUSCULAR | Status: DC | PRN

## 2020-02-12 MED ORDER — SODIUM CHLORIDE 0.9 % IR SOLN
Status: DC | PRN
  Administered 2020-02-12: 3000 mL

## 2020-02-12 MED ORDER — CARBIDOPA-LEVODOPA 25-100 MG PO TABS
1.0000 | ORAL_TABLET | Freq: Three times a day (TID) | ORAL | Status: DC
  Administered 2020-02-13: 1 via ORAL
  Filled 2020-02-12: qty 1

## 2020-02-12 MED ORDER — LIDOCAINE 2% (20 MG/ML) 5 ML SYRINGE
INTRAMUSCULAR | Status: AC
  Filled 2020-02-12: qty 5

## 2020-02-12 MED ORDER — OXYCODONE HCL 5 MG PO TABS
5.0000 mg | ORAL_TABLET | ORAL | Status: DC | PRN
  Filled 2020-02-12: qty 1

## 2020-02-12 MED ORDER — OXYCODONE HCL 5 MG PO TABS: 5.0000 mg | ORAL_TABLET | Freq: Once | ORAL | Status: DC | PRN

## 2020-02-12 SURGICAL SUPPLY — 16 items
BAG URO CATCHER STRL LF (MISCELLANEOUS) ×3 IMPLANT
CATH FOLEY 2WAY 5CC 16FR (CATHETERS) ×2
CATH URET 5FR 28IN OPEN ENDED (CATHETERS) ×3 IMPLANT
CATH URTH STD 16FR FL 2W DRN (CATHETERS) ×1 IMPLANT
CLOTH BEACON ORANGE TIMEOUT ST (SAFETY) ×3 IMPLANT
GLOVE SURG SS PI 8.0 STRL IVOR (GLOVE) IMPLANT
GOWN STRL REUS W/TWL XL LVL3 (GOWN DISPOSABLE) ×3 IMPLANT
GUIDEWIRE STR DUAL SENSOR (WIRE) ×3 IMPLANT
KIT TURNOVER KIT A (KITS) IMPLANT
MANIFOLD NEPTUNE II (INSTRUMENTS) ×3 IMPLANT
PACK CYSTO (CUSTOM PROCEDURE TRAY) ×3 IMPLANT
STENT URET 6FRX24 CONTOUR (STENTS) ×3 IMPLANT
STENT URET 6FRX26 CONTOUR (STENTS) ×3 IMPLANT
TUBING CONNECTING 10 (TUBING) ×2 IMPLANT
TUBING CONNECTING 10' (TUBING) ×1
TUBING UROLOGY SET (TUBING) IMPLANT

## 2020-02-12 NOTE — H&P (Signed)
CC/HPI: CC: Left ureteral calculus, history of bladder cancer.  HPI:  74 year old male with a history of bladder cancer. He required multiple resections. He has not had a cystoscopy since 2017. Most recently, he presented to the emergency department on 04/24/2019. He was having severe left-sided flank pain. He underwent a CT of the abdomen and pelvis that revealed an obstructing stone in the distal left ureter that was 4 mm in size. There was mild left-sided hydronephrosis. Creatinine was 1.2. Urinalysis was not consistent with infection. He continues with microscopic hematuria today but his pain has resolved. He has not seen a stone pass. He also had multiple renal calculi. He also complains of groin cellulitis from candida. He has some itching and irritation.   05/12/2019  Patient presents after undergoing a renal ultrasound and KUB. In the interval, he saw a stone pass. KUB shows a right lower pole calculus. No ureteral calculus. Hydronephrosis has resolved on ultrasound. He has no pain. On ultrasound, he was noted to have 750 cc of urine in his bladder. He had not voided. He states he has a good urinary stream when he does void. Postvoid residual was 350 cc.   02/12/20:     ALLERGIES: Sulfa Drugs    MEDICATIONS: Levothyroxine Sodium  Tamsulosin Hcl 0.4 mg capsule  Cymbalta  Diazepam 5 mg tablet Oral  Diovan Hct 320 mg-12.5 mg tablet Oral  Ibuprofen 800 mg tablet Oral  Tylenol     GU PSH: Cystoscopy - 04/26/2019, 2017 Cystoscopy TURBT <2 cm - 2016 Cystoscopy TURBT 2-5 cm - 2009       PSH Notes: Cystoscopy With Fulguration Small Lesion (5-75mm), Neck Surgery, Back Surgery, Cystoscopy With Fulguration Medium Lesion (2-5cm), Cystoscopy Bladder Tumor   NON-GU PSH: Eye Surgery (Unspecified), Right Knee replacement, Left     GU PMH: Incomplete bladder emptying - 05/12/2019 Bladder Cancer overlapping sites - 04/26/2019 Renal and ureteral calculus - 04/26/2019 Ureteral obstruction secondary  to calculous - 04/26/2019 Bladder Cancer Anterior - 2017, Malignant neoplasm of anterior wall of urinary bladder, - 2017 Bladder Cancer Dome - 2017 Bladder Cancer Trigone - 2017 ED due to arterial insufficiency, 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, Malignant neoplasm of bladder neck - 2017 Urinary Retention, Acute retention of urine - 2016 Areflexic bladder, Hypotonic bladder - 2016 Bladder, Oth neuromuscular dysfunction, Frequency-urgency syndrome - 2016 Hydronephrosis Unspec, Hydronephrosis, right - 2016 Other microscopic hematuria, Microscopic hematuria - 2016 Urinary Retention, Unspec, Urinary retention - 2016, Acute Urinary Retention, - 2014 Bladder Cancer, Unspec, Stage I papillary adenocarcinoma of bladder - 2016 BPH w/LUTS, Benign localized hyperplasia of prostate with urinary obstruction - 2016 Urinary Tract Inf, Unspec site, Urinary tract infection - 2016 Encounter for Prostate Cancer screening, Prostate cancer screening - 2014 History of bladder cancer, Bladder Cancer - 2014 Inflammatory Disease Prostate, Unspec, Prostatitis - 2014      PMH Notes:  2006-12-23 15:00:39 - Note: Anxiety  2008-09-26 17:37:15 - Note: Epididymis Mass (___cm)   NON-GU PMH: Candidiasis, unspecified - 04/26/2019 Encounter for general adult medical examination without abnormal findings, Encounter for preventive health examination - 2017 Cardiac murmur, unspecified, Murmurs - 2014 Personal history of other diseases of the circulatory system, History of hypertension - 2014 Personal history of other diseases of the nervous system and sense organs, History of sleep apnea - 2014 Personal history of other mental and behavioral disorders, History of depression - 2014 Unspecified fracture of left wrist and hand, sequela    FAMILY HISTORY: Kidney  Cancer - Runs In Family Kidney Stones - Runs In Family Prostate Cancer - Runs In Family, Runs In Family Urologic Disorder -  Runs In Family   SOCIAL HISTORY: Marital Status: Married Preferred Language: English; Race: White Current Smoking Status: Patient does not smoke anymore.  Has never drank.  Does not drink caffeine.     Notes: Former smoker, Tobacco use, Occupation:, Marital History - Currently Married, Alcohol Use   REVIEW OF SYSTEMS:    GU Review Male:   Patient denies frequent urination, hard to postpone urination, burning/ pain with urination, get up at night to urinate, leakage of urine, stream starts and stops, trouble starting your stream, have to strain to urinate , erection problems, and penile pain.  Gastrointestinal (Upper):   Patient reports nausea. Patient denies vomiting and indigestion/ heartburn.  Gastrointestinal (Lower):   Patient reports constipation. Patient denies diarrhea.  Constitutional:   Patient reports fever and night sweats. Patient denies weight loss and fatigue.  Skin:   Patient denies skin rash/ lesion and itching.  Eyes:   Patient denies blurred vision and double vision.  Ears/ Nose/ Throat:   Patient denies sore throat and sinus problems.  Hematologic/Lymphatic:   Patient denies easy bruising and swollen glands.  Cardiovascular:   Patient denies leg swelling and chest pains.  Respiratory:   Patient denies cough and shortness of breath.  Endocrine:   Patient denies excessive thirst.  Musculoskeletal:   Patient reports back pain. Patient denies joint pain.  Neurological:   Patient denies headaches and dizziness.  Psychologic:   Patient denies depression and anxiety.   VITAL SIGNS:      02/12/2020 11:14 AM  Weight 227 lb / 102.97 kg  Height 72 in / 182.88 cm  BP 162/83 mmHg  Pulse 121 /min  Temperature 98.9 F / 37.1 C  BMI 30.8 kg/m   GU PHYSICAL EXAMINATION:      Notes: CVA tenderness noted.    MULTI-SYSTEM PHYSICAL EXAMINATION:    Constitutional: Well-nourished. No physical deformities. Normally developed. Good grooming.   Respiratory: Normal breath sounds. No  labored breathing, no use of accessory muscles.   Cardiovascular: Warm extremities, tachycardia. Normal extremity pulses, no swelling, no varicosities.   Skin: No paleness, no jaundice, no cyanosis. No lesion, no ulcer, no rash.  Neurologic / Psychiatric: Oriented to time, oriented to place, oriented to person. No depression, no anxiety, no agitation.  Gastrointestinal: Obese abdomen. No mass, no tenderness, no rigidity.   Eyes: Normal conjunctivae. Normal eyelids.  Musculoskeletal: Normal gait and station of head and neck.     PAST DATA REVIEWED:  Source Of History:  Patient  Lab Test Review:   CBC with Diff, CMP  Records Review:   Previous Doctor Records, Previous Hospital Records, Previous Patient Records  Urine Test Review:   Urinalysis, Urine Culture  Urodynamics Review:   Review Bladder Scan  X-Ray Review: KUB Additional Views: Reviewed Films.  C.T. Stone Protocol: Reviewed Films. CLINICAL DATA: Left-sided flank pain, nausea since 3 a.m.   EXAM: CT ABDOMEN AND PELVIS WITHOUT CONTRAST   TECHNIQUE: Multidetector CT imaging of the abdomen and pelvis was performed following the standard protocol without IV contrast.   COMPARISON: 04/24/2019   FINDINGS: Lower chest: No acute pleural or parenchymal lung disease.   Hepatobiliary: No focal liver abnormality is seen. No gallstones, gallbladder wall thickening, or biliary dilatation.   Pancreas: Unremarkable. No pancreatic ductal dilatation or surrounding inflammatory changes.   Spleen: Normal in size without focal abnormality.  Adrenals/Urinary Tract: There are multiple bilateral nonobstructing renal calculi. Largest in the left kidney measures 15 mm on image 32, and in the right kidney measures 11 mm on image 40.   No evidence of right-sided obstruction.   On the left, there is an obstructing proximal left ureteral calculus measuring 6 mm on image 47. Mild to moderate left-sided hydronephrosis.   The bladder is unremarkable. The adrenals are normal.    Stomach/Bowel: Stomach is within normal limits. Appendix appears normal. No evidence of bowel wall thickening, distention, or inflammatory changes. Diverticulosis of the descending and sigmoid colon without diverticulitis.   Vascular/Lymphatic: Aortic atherosclerosis. No enlarged abdominal or pelvic lymph nodes.   Reproductive: Prostate is unremarkable.   Other: Stable fat containing umbilical hernia. No free fluid or free gas.   Musculoskeletal: No acute or destructive bony lesions. Postsurgical changes are seen from L3 through S1 with laminectomy, posterior fusion, discectomy. Reconstructed images demonstrate no additional findings.   IMPRESSION: 1. Left-sided obstructive uropathy related to an obstructing 6 mm proximal left ureteral calculus. 2. Other bilateral nonobstructing renal calculi. 3. Distal colonic diverticulosis without diverticulitis. The     Electronically Signed By: Randa Ngo M.D. On: 02/09/2020 18:56      06/09/16 06/21/12  PSA  Total PSA 0.3  0.27   Free PSA 0.09    % Free PSA 30      02/12/20  Urinalysis  Urine Appearance Clear   Urine Color Yellow   Urine Glucose Neg mg/dL  Urine Bilirubin Neg mg/dL  Urine Ketones Neg mg/dL  Urine Specific Gravity 1.015   Urine Blood 1+ ery/uL  Urine pH <=5.0   Urine Protein 1+ mg/dL  Urine Urobilinogen 0.2 mg/dL  Urine Nitrites Neg   Urine Leukocyte Esterase Neg leu/uL  Urine WBC/hpf NS (Not Seen)   Urine RBC/hpf 3 - 10/hpf   Urine Epithelial Cells NS (Not Seen)   Urine Bacteria NS (Not Seen)   Urine Mucous Not Present   Urine Yeast NS (Not Seen)   Urine Trichomonas Not Present   Urine Cystals NS (Not Seen)   Urine Casts NS (Not Seen)   Urine Sperm Not Present   Notes:                     BUN 26, Creatinine 1.43-2/12/21   PROCEDURES:         PVR Ultrasound - [id number]  Scanned Volume: 338 cc         Urinalysis w/Scope Dipstick Dipstick Cont'd Micro  Color: Yellow Bilirubin: Neg mg/dL WBC/hpf: NS (Not Seen)  Appearance:  Clear Ketones: Neg mg/dL RBC/hpf: 3 - 10/hpf  Specific Gravity: 1.015 Blood: 1+ ery/uL Bacteria: NS (Not Seen)  pH: <=5.0 Protein: 1+ mg/dL Cystals: NS (Not Seen)  Glucose: Neg mg/dL Urobilinogen: 0.2 mg/dL Casts: NS (Not Seen)    Nitrites: Neg Trichomonas: Not Present    Leukocyte Esterase: Neg leu/uL Mucous: Not Present      Epithelial Cells: NS (Not Seen)      Yeast: NS (Not Seen)      Sperm: Not Present         Ceftriaxone 1g - [id number], [id number] Qty: 1 Adm. By: Providence Lanius  Unit: gram Lot No [id number]  Route: IM Exp. Date 10/28/2020  Freq: None Mfgr.:   Site: Right Buttock   ASSESSMENT:      ICD-10 Details  1 GU:   Renal and ureteral calculus - N20.2 Acute, Systemic Symptoms  2   Ureteral obstruction secondary  to calculous - N13.2 Acute, Systemic Symptoms   PLAN:           Orders Labs CULTURE, URINE          Schedule Return Visit/Planned Activity: 1 Week - Follow up MD  Procedure: 02/12/2020 at [facility] - [phone] - Ceftriaxone 1g (Injection, Ceftriaxone Sodium, Per 250 Mg) [id number], [id number]          Document Letter(s):  Created for Patient: Clinical Summary         Notes:   CT scan from 02/09/20 ED visit shows Non-obstructing right 16mm stone appears to be in the renal pelvis and a obstructing left proximal 64mm ureteral stone with moderate hydronephrosis. He had an inconclusive urine culture from the ED which grew multiple species. Today his urine shows RBCs and protein. He is urinating frequently small amounts and not emptying well. PVR 343mls. He does complain of left sided flank pain, subjective fever, tachycardia, and nausea. This is concerning for infection and worsening obstruction. I will send urine for culture and l discuss with the on call MD to see how to proceed.  He has been given 1 gram of IM rocephin in office. He refused pain and nausea medication at this time. He last ate at 10:30am today. He is now NPO. He will proceed to [facility] admitting for a  cysto w/RGP and bilateral ureteral stent placement with on call MD today. Follow up in 1 week with urologist for post op/pre-op ureteroscopy with endoscopic lithotripsy. I advised him to not eat or drink anything and to proceed to [facility] admitting. He was agreeable to plan. All questions were answered to the best of my ability. Discussed with Dr. Jeffie Pollock, on call MD.         Next Appointment:      Next Appointment: 02/27/2020 11:30 AM    Appointment Type: Office Visit Established Patient    Location: [facility] [phone]    Provider: Link Snuffer, III, M.D.    Reason for Visit: 1 wee kf/u

## 2020-02-12 NOTE — Discharge Instructions (Signed)

## 2020-02-12 NOTE — Interval H&P Note (Signed)
History and Physical Interval Note:  02/12/2020 6:29 PM  Zachary [name]  has presented today for surgery, with the diagnosis of BILATERAL RENAL STONES.  The various methods of treatment have been discussed with the patient and family. After consideration of risks, benefits and other options for treatment, the patient has consented to  Procedure(s): CYSTOSCOPY WITH RETROGRADE PYELOGRAM/URETERAL STENT PLACEMENT (Bilateral) as a surgical intervention.  The patient's history has been reviewed, patient examined, no change in status, stable for surgery.  I have reviewed the patient's chart and labs.  Questions were answered to the patient's satisfaction.     Irine Seal

## 2020-02-12 NOTE — Transfer of Care (Signed)
Immediate Anesthesia Transfer of Care Note  Patient: Zachary [name]  Procedure(s) Performed: CYSTOSCOPY WITH RETROGRADE PYELOGRAM/URETERAL STENT PLACEMENT (Bilateral Ureter)  Patient Location: PACU  Anesthesia Type:General  Level of Consciousness: awake, alert , oriented and patient cooperative  Airway & Oxygen Therapy: Patient Spontanous Breathing and Patient connected to face mask oxygen  Post-op Assessment: Report given to RN, Post -op Vital signs reviewed and stable and Patient moving all extremities X 4  Post vital signs: stable  Last Vitals:  Vitals Value Taken Time  BP 146/87 02/12/20 1918  Temp 37.3 C 02/12/20 1918  Pulse 95 02/12/20 1929  Resp 21 02/12/20 1929  SpO2 100 % 02/12/20 1929  Vitals shown include unvalidated device data.  Last Pain:  Vitals:   02/12/20 1918  TempSrc:   PainSc: 0-No pain         Complications: No apparent anesthesia complications

## 2020-02-12 NOTE — Op Note (Signed)
Procedure: 1.  Cystoscopy with bilateral retrograde pyelograms and interpretation. 2.  Cystoscopy with insertion of bilateral ureteral stent. 3.  Application of fluoroscopy.  Preop diagnosis: Left proximal ureteral stone and right renal stones.  Postoperative diagnosis: Left ureteral stone and right renal stones.  Surgeon: Dr. Irine Seal.  Anesthesia: General.  Specimen: None.  Drains: 1.  6 French by 26 cm left contour double-J stent without tether. 2.  6 French by 24 cm right contour double-J stent without tether.  EBL: None.  Complications: None.  Indications: Patient is a 74 year old male with history of stones who presented the office today with left flank pain.  He was found to have a left proximal ureteral stone with obstruction and a urine that appeared infected with some associated fever.  It was felt that cystoscopy and stent insertion was indicated.  He was also noted to have 2 moderately large right renal stones 1 of which was in the renal pelvis and was felt that this would require ureteroscopy as well so placement of a stent to provide ureteral dilation and to prevent obstruction was felt to be indicated.  Procedure: He was given 2 g of Ancef.  A general anesthetic was induced.  He was placed in lithotomy position and fitted with PAS hose.  His perineum and genitalia were prepped with Betadine solution he was draped in usual sterile fashion.  Cystoscopy was performed using a 23 [place] scope and 30 degree lens.  Examination revealed a normal urethra.  The external sphincter was intact.  The prostatic urethra was approximately 2 to 3 cm in length with bilobar hyperplasia and some degree of obstruction.  Examination of bladder revealed moderate trabeculation with some cellules.  There were no tumors noted and no inflammation seen but there were some small yellowish granules consistent with uric acid stones.  Ureteral orifices were slightly laterally displaced and somewhat  [place] suggestive of prior resection with previous bladder tumors.  The left ureteral orifice was cannulated with a 5 French opening catheter and contrast was instilled.    Due to the lack of occlusion at the ureteral orifice was difficult to get contrast proximally until the opening catheter had been advanced beyond the mid ureter.  A filling defect suggestive of the stone was not clearly seen but there was tortuosity of the ureter and hydronephrosis.  A sensor wire was advanced to the kidney and the opening catheter was removed.  A 6 French by 26 cm contour double-J stent was advanced over the wire under fluoroscopic guidance.  The wire was removed, leaving a good coil in the kidney and a good coil in the bladder.  He was noted to have brisk efflux of turbid amber urine alongside the stent and previously the wire.  A 5 French opening catheter was then placed in the right ureteral orifice and contrast was instilled.  The left retrograde demonstrated a normal caliber ureter to a nondilated right collecting system with an ovoid filling defect in the lower part of the renal pelvis and a second ovoid filling defect in the midpole both consistent with radiolucent stones.  After completion of the retrograde, a sensor wire was passed to the kidney and the opening catheter was removed.  A 6 French by 24 cm contour double-J stent was advanced to the right kidney under fluoroscopic guidance.  The wire was removed, leaving good coil in the kidney and a good coil in the bladder.  The cystoscope was removed and a 16 [place] Foley cath  was inserted.  This was placed to straight drainage.  He was taken down from lithotomy position, his anesthetic was reversed and he was moved to recovery in stable condition.  There were no complications.

## 2020-02-12 NOTE — Anesthesia Procedure Notes (Signed)
Procedure Name: LMA Insertion Date/Time: 02/12/2020 6:46 PM Performed by: Lissa Morales, CRNA Pre-anesthesia Checklist: Patient identified, Emergency Drugs available, Suction available and Patient being monitored Patient Re-evaluated:Patient Re-evaluated prior to induction Oxygen Delivery Method: Circle system utilized Induction Type: IV induction LMA: LMA with gastric port inserted LMA Size: 5.0 Tube type: Oral Number of attempts: 1 Placement Confirmation: positive ETCO2 Tube secured with: Tape Dental Injury: Teeth and Oropharynx as per pre-operative assessment

## 2020-02-12 NOTE — Anesthesia Preprocedure Evaluation (Addendum)
Anesthesia Evaluation  Patient identified by MRN, date of birth, ID band Patient awake    Reviewed: Allergy & Precautions, NPO status , Patient's Chart, lab work & pertinent test results  History of Anesthesia Complications Negative for: history of anesthetic complications  Airway Mallampati: III  TM Distance: <3 FB Neck ROM: Full    Dental  (+) Dental Advisory Given, Chipped, Missing,    Pulmonary sleep apnea and Continuous Positive Airway Pressure Ventilation , former smoker,    Pulmonary exam normal        Cardiovascular hypertension, Pt. on medications  Rhythm:Regular Rate:Tachycardia     Neuro/Psych  Headaches (cluster), PSYCHIATRIC DISORDERS Anxiety Depression  Parkinsons disease   Neuromuscular disease    GI/Hepatic negative GI ROS, Neg liver ROS,   Endo/Other  Hyperthyroidism (on tapazole)  Obesity Pre-DM   Renal/GU  B/l renal stones     Bladder cancer s/p TURBT      Musculoskeletal  (+) Arthritis ,   Abdominal   Peds  Hematology  (+) Blood dyscrasia, anemia ,   Anesthesia Other Findings Covid neg 02/12/20   Reproductive/Obstetrics                           Anesthesia Physical Anesthesia Plan  ASA: III  Anesthesia Plan: General   Post-op Pain Management:    Induction: Intravenous  PONV Risk Score and Plan: 3 and Treatment may vary due to age or medical condition, Ondansetron and Dexamethasone  Airway Management Planned: LMA  Additional Equipment: None  Intra-op Plan:   Post-operative Plan: Extubation in OR  Informed Consent: I have reviewed the patients History and Physical, chart, labs and discussed the procedure including the risks, benefits and alternatives for the proposed anesthesia with the patient or authorized representative who has indicated his/her understanding and acceptance.     Dental advisory given  Plan Discussed with: CRNA and  Anesthesiologist  Anesthesia Plan Comments:        Anesthesia Quick Evaluation

## 2020-02-13 ENCOUNTER — Encounter: Payer: Self-pay | Admitting: *Deleted

## 2020-02-13 DIAGNOSIS — N132 Hydronephrosis with renal and ureteral calculous obstruction: Secondary | ICD-10-CM | POA: Diagnosis not present

## 2020-02-13 LAB — BASIC METABOLIC PANEL
Anion gap: 9 (ref 5–15)
BUN: 29 mg/dL — ABNORMAL HIGH (ref 8–23)
CO2: 25 mmol/L (ref 22–32)
Calcium: 8.4 mg/dL — ABNORMAL LOW (ref 8.9–10.3)
Chloride: 104 mmol/L (ref 98–111)
Creatinine, Ser: 1.47 mg/dL — ABNORMAL HIGH (ref 0.61–1.24)
GFR calc Af Amer: 54 mL/min — ABNORMAL LOW (ref >60)
GFR calc non Af Amer: 47 mL/min — ABNORMAL LOW (ref >60)
Glucose, Bld: 153 mg/dL — ABNORMAL HIGH (ref 70–99)
Potassium: 4.9 mmol/L (ref 3.5–5.1)
Sodium: 138 mmol/L (ref 135–145)

## 2020-02-13 LAB — CBC
HCT: 38.3 % — ABNORMAL LOW (ref 39.0–52.0)
Hemoglobin: 12.6 g/dL — ABNORMAL LOW (ref 13.0–17.0)
MCH: 30.1 pg (ref 26.0–34.0)
MCHC: 32.9 g/dL (ref 30.0–36.0)
MCV: 91.6 fL (ref 80.0–100.0)
Platelets: 201 10*3/uL (ref 150–400)
RBC: 4.18 MIL/uL — ABNORMAL LOW (ref 4.22–5.81)
RDW: 13.8 % (ref 11.5–15.5)
WBC: 8.5 10*3/uL (ref 4.0–10.5)
nRBC: 0 % (ref 0.0–0.2)

## 2020-02-13 MED ORDER — CIPROFLOXACIN HCL 500 MG PO TABS: 500.0000 mg | ORAL_TABLET | Freq: Two times a day (BID) | ORAL | 0 refills | Status: AC

## 2020-02-14 NOTE — Anesthesia Postprocedure Evaluation (Signed)
Anesthesia Post Note  Patient: Zachary [name]  Procedure(s) Performed: CYSTOSCOPY WITH RETROGRADE PYELOGRAM/URETERAL STENT PLACEMENT (Bilateral Ureter)     Patient location during evaluation: PACU Anesthesia Type: General Level of consciousness: awake and alert Pain management: pain level controlled Vital Signs Assessment: post-procedure vital signs reviewed and stable Respiratory status: spontaneous breathing, nonlabored ventilation and respiratory function stable Cardiovascular status: blood pressure returned to baseline and stable Postop Assessment: no apparent nausea or vomiting Anesthetic complications: no    Last Vitals:  Vitals:   02/13/20 0549 02/13/20 0900  BP: 124/72 (!) 153/73  Pulse: 61   Resp: 17   Temp: 36.7 C   SpO2: 95%     Last Pain:  Vitals:   02/13/20 1100  TempSrc:   PainSc: 0-No pain                 Audry Pili

## 2020-02-15 DIAGNOSIS — H524 Presbyopia: Secondary | ICD-10-CM | POA: Diagnosis not present

## 2020-02-15 DIAGNOSIS — E119 Type 2 diabetes mellitus without complications: Secondary | ICD-10-CM | POA: Diagnosis not present

## 2020-02-15 DIAGNOSIS — Z961 Presence of intraocular lens: Secondary | ICD-10-CM | POA: Diagnosis not present

## 2020-02-16 NOTE — Discharge Summary (Signed)
Physician Discharge Summary  Patient ID: Zachary [name] MRN: [id number] DOB/AGE: 74-18-1947 74 y.o.  Admit date: 02/12/2020 Discharge date: 02/13/2020  Admission Diagnoses:  Discharge Diagnoses:  Active Problems:   Left ureteral stone   Discharged Condition: good  Hospital Course: 74 year old male underwent bilateral ureteral stent placement for a left obstructing stone and right renal pelvic stone with associated infection that was suspected.  Urine culture in the office was negative.  He did well overnight.  He was discharged in stable condition on ciprofloxacin.  Consults: None  Significant Diagnostic Studies:   None  Treatments: surgery:  As above  Discharge Exam: Blood pressure (!) 153/73, pulse 61, temperature 98.1 F (36.7 C), temperature source Oral, resp. rate 17, height 6\' 1"  (1.854 m), weight 102.3 kg, SpO2 95 %. General appearance: alert no acute distress  adequate perfusion of extremities  non labored respiration  abdomen soft, nontender, nondistended  Disposition: Discharge disposition: 01-Home or Self Care        Allergies as of 02/13/2020      Reactions   Sulfonamide Derivatives Itching, Rash, Other (See Comments)      Medication List    TAKE these medications   acetaminophen 500 MG tablet Commonly known as: TYLENOL Take 1,000 mg by mouth at bedtime as needed (for pain.).   carbidopa-levodopa 25-100 MG tablet Commonly known as: SINEMET IR Take 1 tablet by mouth 3 (three) times daily before meals.   ciprofloxacin 500 MG tablet Commonly known as: CIPRO Take 1 tablet (500 mg total) by mouth 2 (two) times daily for 7 days.   diazepam 5 MG tablet Commonly known as: VALIUM Take 1 tablet (5 mg total) by mouth daily as needed for anxiety. for anxiety   DULoxetine 60 MG capsule Commonly known as: CYMBALTA Take 1 capsule (60 mg total) by mouth daily.   ibuprofen 200 MG tablet Commonly known as: ADVIL Take 200 mg by mouth every 6 (six) hours as  needed.   methimazole 5 MG tablet Commonly known as: TAPAZOLE Take 5 mg by mouth every morning.   ondansetron 4 MG disintegrating tablet Commonly known as: Zofran ODT Take 1 tablet (4 mg total) by mouth every 8 (eight) hours as needed for nausea or vomiting.   oxyCODONE-acetaminophen 5-325 MG tablet Commonly known as: PERCOCET/ROXICET Take 1-2 tablets by mouth every 6 (six) hours as needed for severe pain.   pravastatin 10 MG tablet Commonly known as: PRAVACHOL Take 10 mg by mouth at bedtime.   tamsulosin 0.4 MG Caps capsule Commonly known as: FLOMAX Take 1 capsule (0.4 mg total) by mouth daily after breakfast.   valsartan-hydrochlorothiazide 320-25 MG tablet Commonly known as: DIOVAN-HCT Take 1 tablet by mouth daily.      Follow-up Information    Lucas Mallow, MD.   Specialty: Urology Why: Dr. Gloriann Loan will need to schedule you for stone removal in 1-2 weeks.  Contact information: [place] [id number] [phone]           Signed: Marton Redwood, III 02/16/2020, 11:57 AM

## 2020-02-26 HISTORY — PX: LITHOTRIPSY: SUR834

## 2020-03-01 DIAGNOSIS — N202 Calculus of kidney with calculus of ureter: Secondary | ICD-10-CM | POA: Diagnosis not present

## 2020-03-01 DIAGNOSIS — N1339 Other hydronephrosis: Secondary | ICD-10-CM | POA: Diagnosis not present

## 2020-03-08 DIAGNOSIS — N202 Calculus of kidney with calculus of ureter: Secondary | ICD-10-CM | POA: Diagnosis not present

## 2020-03-12 ENCOUNTER — Other Ambulatory Visit: Payer: Self-pay | Admitting: Diagnostic Neuroimaging

## 2020-03-13 ENCOUNTER — Ambulatory Visit: Payer: PPO

## 2020-03-13 ENCOUNTER — Other Ambulatory Visit: Payer: Self-pay

## 2020-03-13 ENCOUNTER — Ambulatory Visit: Payer: PPO | Admitting: Orthopedic Surgery

## 2020-03-13 DIAGNOSIS — Z96652 Presence of left artificial knee joint: Secondary | ICD-10-CM

## 2020-03-13 DIAGNOSIS — M1712 Unilateral primary osteoarthritis, left knee: Secondary | ICD-10-CM | POA: Diagnosis not present

## 2020-03-13 DIAGNOSIS — M1710 Unilateral primary osteoarthritis, unspecified knee: Secondary | ICD-10-CM

## 2020-03-13 NOTE — Progress Notes (Signed)
Chief Complaint  Patient presents with  . Follow-up    Left total knee February 01, 6729    74 year old male had a left total knee back on February 01, 2018 comes in for his 2-year annual follow-up.  No complaints   Xray looks good   rom 0-120  Encounter Diagnoses  Name Primary?  . 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 Arthritis of knee Yes    F/u in a year

## 2020-04-10 DIAGNOSIS — N200 Calculus of kidney: Secondary | ICD-10-CM | POA: Diagnosis not present

## 2020-04-10 DIAGNOSIS — Z8551 Personal history of malignant neoplasm of bladder: Secondary | ICD-10-CM | POA: Diagnosis not present

## 2020-04-10 DIAGNOSIS — R3914 Feeling of incomplete bladder emptying: Secondary | ICD-10-CM | POA: Diagnosis not present

## 2020-05-09 DIAGNOSIS — G20 Parkinson's disease: Secondary | ICD-10-CM | POA: Diagnosis not present

## 2020-05-09 DIAGNOSIS — Z96652 Presence of left artificial knee joint: Secondary | ICD-10-CM | POA: Diagnosis not present

## 2020-05-09 DIAGNOSIS — R000 Tachycardia, unspecified: Secondary | ICD-10-CM | POA: Diagnosis not present

## 2020-05-09 DIAGNOSIS — Z8551 Personal history of malignant neoplasm of bladder: Secondary | ICD-10-CM | POA: Diagnosis not present

## 2020-05-09 DIAGNOSIS — F419 Anxiety disorder, unspecified: Secondary | ICD-10-CM | POA: Diagnosis not present

## 2020-05-09 DIAGNOSIS — Z87442 Personal history of urinary calculi: Secondary | ICD-10-CM | POA: Diagnosis not present

## 2020-05-09 DIAGNOSIS — I10 Essential (primary) hypertension: Secondary | ICD-10-CM | POA: Diagnosis not present

## 2020-05-09 DIAGNOSIS — F339 Major depressive disorder, recurrent, unspecified: Secondary | ICD-10-CM | POA: Diagnosis not present

## 2020-05-09 DIAGNOSIS — E1169 Type 2 diabetes mellitus with other specified complication: Secondary | ICD-10-CM | POA: Diagnosis not present

## 2020-05-09 DIAGNOSIS — Z2829 Immunization not carried out because of patient decision for other reason: Secondary | ICD-10-CM | POA: Diagnosis not present

## 2020-05-09 DIAGNOSIS — R946 Abnormal results of thyroid function studies: Secondary | ICD-10-CM | POA: Diagnosis not present

## 2020-05-09 DIAGNOSIS — E782 Mixed hyperlipidemia: Secondary | ICD-10-CM | POA: Diagnosis not present

## 2020-05-30 DIAGNOSIS — E1165 Type 2 diabetes mellitus with hyperglycemia: Secondary | ICD-10-CM | POA: Diagnosis not present

## 2020-05-30 DIAGNOSIS — E782 Mixed hyperlipidemia: Secondary | ICD-10-CM | POA: Diagnosis not present

## 2020-05-30 DIAGNOSIS — F339 Major depressive disorder, recurrent, unspecified: Secondary | ICD-10-CM | POA: Diagnosis not present

## 2020-05-30 DIAGNOSIS — B356 Tinea cruris: Secondary | ICD-10-CM | POA: Diagnosis not present

## 2020-05-30 DIAGNOSIS — E7849 Other hyperlipidemia: Secondary | ICD-10-CM | POA: Diagnosis not present

## 2020-05-30 DIAGNOSIS — E119 Type 2 diabetes mellitus without complications: Secondary | ICD-10-CM | POA: Diagnosis not present

## 2020-05-30 DIAGNOSIS — E1169 Type 2 diabetes mellitus with other specified complication: Secondary | ICD-10-CM | POA: Diagnosis not present

## 2020-05-30 DIAGNOSIS — G20 Parkinson's disease: Secondary | ICD-10-CM | POA: Diagnosis not present

## 2020-05-30 DIAGNOSIS — E039 Hypothyroidism, unspecified: Secondary | ICD-10-CM | POA: Diagnosis not present

## 2020-05-30 DIAGNOSIS — B349 Viral infection, unspecified: Secondary | ICD-10-CM | POA: Diagnosis not present

## 2020-05-30 DIAGNOSIS — D508 Other iron deficiency anemias: Secondary | ICD-10-CM | POA: Diagnosis not present

## 2020-05-30 DIAGNOSIS — F419 Anxiety disorder, unspecified: Secondary | ICD-10-CM | POA: Diagnosis not present

## 2020-06-04 DIAGNOSIS — Z87442 Personal history of urinary calculi: Secondary | ICD-10-CM | POA: Diagnosis not present

## 2020-06-04 DIAGNOSIS — R946 Abnormal results of thyroid function studies: Secondary | ICD-10-CM | POA: Diagnosis not present

## 2020-06-04 DIAGNOSIS — E782 Mixed hyperlipidemia: Secondary | ICD-10-CM | POA: Diagnosis not present

## 2020-06-04 DIAGNOSIS — Z96652 Presence of left artificial knee joint: Secondary | ICD-10-CM | POA: Diagnosis not present

## 2020-06-04 DIAGNOSIS — E1169 Type 2 diabetes mellitus with other specified complication: Secondary | ICD-10-CM | POA: Diagnosis not present

## 2020-06-04 DIAGNOSIS — R944 Abnormal results of kidney function studies: Secondary | ICD-10-CM | POA: Diagnosis not present

## 2020-06-04 DIAGNOSIS — R000 Tachycardia, unspecified: Secondary | ICD-10-CM | POA: Diagnosis not present

## 2020-06-04 DIAGNOSIS — Z8551 Personal history of malignant neoplasm of bladder: Secondary | ICD-10-CM | POA: Diagnosis not present

## 2020-06-04 DIAGNOSIS — G20 Parkinson's disease: Secondary | ICD-10-CM | POA: Diagnosis not present

## 2020-06-04 DIAGNOSIS — F339 Major depressive disorder, recurrent, unspecified: Secondary | ICD-10-CM | POA: Diagnosis not present

## 2020-06-04 DIAGNOSIS — F419 Anxiety disorder, unspecified: Secondary | ICD-10-CM | POA: Diagnosis not present

## 2020-06-04 DIAGNOSIS — I10 Essential (primary) hypertension: Secondary | ICD-10-CM | POA: Diagnosis not present

## 2020-07-03 ENCOUNTER — Telehealth: Payer: Self-pay | Admitting: Orthopedic Surgery
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
## 2020-07-04 DIAGNOSIS — R946 Abnormal results of thyroid function studies: Secondary | ICD-10-CM | POA: Diagnosis not present

## 2020-07-04 DIAGNOSIS — R944 Abnormal results of kidney function studies: Secondary | ICD-10-CM | POA: Diagnosis not present

## 2020-07-04 DIAGNOSIS — I10 Essential (primary) hypertension: Secondary | ICD-10-CM | POA: Diagnosis not present

## 2020-07-04 DIAGNOSIS — Z96652 Presence of left artificial knee joint: Secondary | ICD-10-CM | POA: Diagnosis not present

## 2020-07-04 DIAGNOSIS — F339 Major depressive disorder, recurrent, unspecified: Secondary | ICD-10-CM | POA: Diagnosis not present

## 2020-07-04 DIAGNOSIS — Z8551 Personal history of malignant neoplasm of bladder: Secondary | ICD-10-CM | POA: Diagnosis not present

## 2020-07-04 DIAGNOSIS — G20 Parkinson's disease: Secondary | ICD-10-CM | POA: Diagnosis not present

## 2020-07-04 DIAGNOSIS — E1169 Type 2 diabetes mellitus with other specified complication: Secondary | ICD-10-CM | POA: Diagnosis not present

## 2020-07-04 DIAGNOSIS — Z87442 Personal history of urinary calculi: Secondary | ICD-10-CM | POA: Diagnosis not present

## 2020-07-04 DIAGNOSIS — R000 Tachycardia, unspecified: Secondary | ICD-10-CM | POA: Diagnosis not present

## 2020-07-04 DIAGNOSIS — E782 Mixed hyperlipidemia: Secondary | ICD-10-CM | POA: Diagnosis not present

## 2020-07-04 DIAGNOSIS — F419 Anxiety disorder, unspecified: Secondary | ICD-10-CM | POA: Diagnosis not present

## 2020-07-08 ENCOUNTER — Telehealth: Payer: Self-pay | Admitting: Orthopedic Surgery

## 2020-07-08 NOTE — Telephone Encounter (Signed)
Patient called back following up about his ankle injury as per previous phone note. States he has not yet sought treatment; discussed next available. States he will contact his primary care in event they may see and/or order an Xray. Aware to call us back about scheduling appointment.

## 2020-07-09 ENCOUNTER — Other Ambulatory Visit (HOSPITAL_COMMUNITY): Payer: Self-pay | Admitting: Adult Health Nurse Practitioner

## 2020-07-09 ENCOUNTER — Other Ambulatory Visit: Payer: Self-pay

## 2020-07-09 ENCOUNTER — Ambulatory Visit (HOSPITAL_COMMUNITY)
Admission: RE | Admit: 2020-07-09 | Discharge: 2020-07-09 | Disposition: A | Discharge status: Home / Self Care | Payer: PPO | Source: Ambulatory Visit | Attending: Adult Health Nurse Practitioner | Admitting: Adult Health Nurse Practitioner

## 2020-07-09 ENCOUNTER — Telehealth: Payer: Self-pay | Admitting: Orthopedic Surgery

## 2020-07-09 DIAGNOSIS — M25572 Pain in left ankle and joints of left foot: Secondary | ICD-10-CM | POA: Diagnosis not present

## 2020-07-09 DIAGNOSIS — M79672 Pain in left foot: Secondary | ICD-10-CM | POA: Diagnosis not present

## 2020-07-09 DIAGNOSIS — S99912A Unspecified injury of left ankle, initial encounter: Secondary | ICD-10-CM | POA: Diagnosis not present

## 2020-07-09 DIAGNOSIS — S99922A Unspecified injury of left foot, initial encounter: Secondary | ICD-10-CM | POA: Diagnosis not present

## 2020-07-09 NOTE — Telephone Encounter (Signed)
-----   Message from Carole Civil, MD sent at 07/09/2020 10:23 AM EDT ----- Regarding: RE: Appt for New problem/ankle injury Friday 1150  ----- Message ----- From: Uvaldo Bristle Sent: 07/03/2020  12:22 PM EDT To: Carole Civil, MD Subject: Appt for New problem/ankle injury              Dr Aline Brochure,  RE: Zachary [name]  [phone] - I had already signed off on telephone note as discussed. Patient requests appointment - please advise re: SLOT for New injury, LT Ankle/Need Xray Thank you

## 2020-07-10 ENCOUNTER — Ambulatory Visit (INDEPENDENT_AMBULATORY_CARE_PROVIDER_SITE_OTHER): Payer: PPO | Admitting: Orthopedic Surgery

## 2020-07-10 ENCOUNTER — Encounter: Payer: Self-pay | Admitting: Orthopedic Surgery

## 2020-07-10 VITALS — BP 145/83 | HR 95 | Ht 73.0 in | Wt 218.5 lb

## 2020-07-10 DIAGNOSIS — T148XXA Other injury of unspecified body region, initial encounter: Secondary | ICD-10-CM | POA: Diagnosis not present

## 2020-07-10 NOTE — Telephone Encounter (Signed)
As of 07/09/20 per Dr Aline Brochure, patient was called, scheduled for 07/10/20; aware of appointment - completed as scheduled.
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
## 2020-07-12 ENCOUNTER — Ambulatory Visit: Payer: PPO | Admitting: Orthopedic Surgery

## 2020-07-31 DIAGNOSIS — M461 Sacroiliitis, not elsewhere classified: Secondary | ICD-10-CM | POA: Diagnosis not present

## 2020-08-15 DIAGNOSIS — E1165 Type 2 diabetes mellitus with hyperglycemia: Secondary | ICD-10-CM | POA: Diagnosis not present

## 2020-08-15 DIAGNOSIS — E7849 Other hyperlipidemia: Secondary | ICD-10-CM | POA: Diagnosis not present

## 2020-08-15 DIAGNOSIS — I10 Essential (primary) hypertension: Secondary | ICD-10-CM | POA: Diagnosis not present

## 2020-09-17 DIAGNOSIS — E1165 Type 2 diabetes mellitus with hyperglycemia: Secondary | ICD-10-CM | POA: Diagnosis not present

## 2020-09-17 DIAGNOSIS — E7849 Other hyperlipidemia: Secondary | ICD-10-CM | POA: Diagnosis not present

## 2020-09-17 DIAGNOSIS — I10 Essential (primary) hypertension: Secondary | ICD-10-CM | POA: Diagnosis not present

## 2020-10-15 ENCOUNTER — Other Ambulatory Visit: Payer: Self-pay

## 2020-10-15 ENCOUNTER — Encounter: Payer: Self-pay | Admitting: Diagnostic Neuroimaging

## 2020-10-15 ENCOUNTER — Ambulatory Visit: Payer: PPO | Admitting: Diagnostic Neuroimaging

## 2020-10-15 VITALS — BP 133/67 | HR 60 | Ht 73.0 in | Wt 215.0 lb

## 2020-10-15 DIAGNOSIS — G20 Parkinson's disease: Secondary | ICD-10-CM | POA: Diagnosis not present

## 2020-10-15 DIAGNOSIS — R269 Unspecified abnormalities of gait and mobility: Secondary | ICD-10-CM

## 2020-10-15 NOTE — Progress Notes (Signed)
[facility]  PATIENT: Zachary [name] DOB: 1946-04-17  REFERRING CLINICIAN: Merlyn Albert, MD HISTORY FROM: patient and wife REASON FOR VISIT: follow up   HISTORICAL  CHIEF COMPLAINT:  Chief Complaint  Patient presents with  . Parkinsonism    rm 7, one year FU wife- [name]  "word finding difficulty per wife"    HISTORY OF PRESENT ILLNESS:   UPDATE (10/15/20, VRP): Since last visit, doing well. Symptoms are stable. On taking carb/levo 1 tab daily. Gait, memory issues stable per patient. Now has hearing aids. Some tinnitus issues since that time.  UPDATE (10/10/19, VRP): Since last visit, doing well. Symptoms are stable. Severity is mild. No alleviating or aggravating factors. Tolerating meds. Hearing is slightly off, and sometimes he has arguments with spouse about recall of events.  UPDATE (09/27/18, VRP): Since last visit, doing well. Symptoms are stable. Severity is mild. No alleviating or aggravating factors. Tolerating meds.  2 falls since last visit.   UPDATE (12/22/17, VRP): Since last visit, doing about the same. Wife is under high stress. She feels that patient has lost motivation. Patient feels fair. Tolerating meds. No alleviating or aggravating factors.   UPDATE 09/21/17: Since last visit, stable, but out of meds for anxiety. Needs a new psychiatry clinic.   UPDATE 04/01/17: Since last visit has had MRI brain and c-spine, and had PT evaluation. Memory loss and gait issues are stable. Has life stress with concern about his mother's health. He is looking forward to football pre-season starting.   PRIOR HPI (01/15/17): 74 year old male here for evaluation of memory problems and gait difficulty. Patient reports memory problems for past 1 year. Wife thinks he has been having memory problem for at least 3 years. He is forgetting dates, fax, having trouble with comprehension, sleeping more. Also patient having more balance and walking problems. She has noticed that  patient is taking small shuffling steps. 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 Following these 2 surgeries he had extensive physical therapy and rehabilitation. Patient attributes his balance problems to his low back surgery problem. Patient also has history of bladder cancer, hypertension, ringing in ears, depression, anxiety. Patient has had deterioration handwriting, irregular sleep, soft and hoarse voice. Patient having some intermittent tremor in his hands.   REVIEW OF SYSTEMS: Full 14 system review of systems performed and negative with exception of: as per HPI.    ALLERGIES: Allergies  Allergen Reactions  . Sulfonamide Derivatives Itching, Rash and Other (See Comments)    HOME MEDICATIONS: Outpatient Medications Prior to Visit  Medication Sig Dispense Refill  . acetaminophen (TYLENOL) 500 MG tablet Take 1,000 mg by mouth at bedtime as needed (for pain.).     Marland Kitchen carbidopa-levodopa (SINEMET IR) 25-100 MG tablet Take 1 tablet by mouth 3 (three) times daily before meals. 270 tablet 4  . DULoxetine (CYMBALTA) 60 MG capsule Take 1 capsule (60 mg total) by mouth daily. 90 capsule 3  . ibuprofen (ADVIL) 200 MG tablet Take 200 mg by mouth every 6 (six) hours as needed.    . meloxicam (MOBIC) 15 MG tablet Take 15 mg by mouth daily.    . methimazole (TAPAZOLE) 5 MG tablet Take 5 mg by mouth every morning.     . pravastatin (PRAVACHOL) 10 MG tablet Take 10 mg by mouth at bedtime.    . valsartan-hydrochlorothiazide (DIOVAN-HCT) 320-25 MG tablet Take 1 tablet by mouth  daily.     . ondansetron (ZOFRAN ODT) 4 MG disintegrating tablet Take 1 tablet (4 mg total) by mouth every 8 (eight) hours as needed for nausea or vomiting. (Patient not taking: Reported on 10/15/2020) 8 tablet 0  . oxyCODONE-acetaminophen (PERCOCET/ROXICET) 5-325 MG tablet Take 1-2 tablets by mouth every 6 (six) hours as needed  for severe pain. 10 tablet 0  . tamsulosin (FLOMAX) 0.4 MG CAPS capsule Take 1 capsule (0.4 mg total) by mouth daily after breakfast. (Patient not taking: Reported on 10/15/2020) 10 capsule 0  . diazepam (VALIUM) 5 MG tablet Take 1 tablet (5 mg total) by mouth daily as needed for anxiety. for anxiety 30 tablet 5   No facility-administered medications prior to visit.    PAST MEDICAL HISTORY: Past Medical History:  Diagnosis Date  . Anxiety    takes Valium as needed  . Arthritis   . Bladder cancer ([place])    takes Rapaflo daily  . Blood dyscrasia    07/08/16: pt told he was a "free bleeder" after bleeding a lot when derm cut off mole on forehead. No excessive bleeding with minor wounds at home, no bleeding problems perioperatively with prior surgeries.  . Chronic back pain    spondylolisthesis  . Cluster headaches   . Depression    takes Lexapro daily  . Essential hypertension, benign    takes Diovan-HCT daily  . Hearing loss    hearing aids  . Hx of complications due to general anesthesia    Aborted surgery 07/15/2016 due to hypotension per pt  . Joint pain   . Kidney stone 03/2019   passed stone  . Obstructive sleep apnea on CPAP    uses CPAP @ night  . Parkinsonism ([place])   . Pneumonia    "several times"--last time about 3-4 yrs ago  . Prediabetes   . Thyroid condition     PAST SURGICAL HISTORY: Past Surgical History:  Procedure Laterality Date  . ANTERIOR CERVICAL DECOMP/DISCECTOMY FUSION N/A 06/05/2013   Procedure:  C3-4 Anterior Cervical Discectomy and Fusion, Allograft, Plate;  Surgeon: [name], MD;  Location: [place];  Service: Orthopedics;  Laterality: N/A;  C3-4 Anterior Cervical Discectomy and Fusion, Allograft, Plate  . Arthroscopic knee surgery    . BACK SURGERY      x 2  . BLADDER SURGERY     x 5 to remove tumor  . CARPAL TUNNEL RELEASE    . CATARACT EXTRACTION W/PHACO  12/19/2012   Procedure: CATARACT EXTRACTION PHACO AND INTRAOCULAR LENS PLACEMENT (IOC);   Surgeon: [name], MD;  Location: [facility];  Service: Ophthalmology;  Laterality: Left;  CDE: 26.80  . CATARACT EXTRACTION W/PHACO  01/09/2013   Procedure: CATARACT EXTRACTION PHACO AND INTRAOCULAR LENS PLACEMENT (IOC);  Surgeon: [name], MD;  Location: [facility];  Service: Ophthalmology;  Laterality: Right;  CDE:22.83  . CERVICAL FUSION  06/05/2013   C 3  C4  . CYSTOSCOPY    . CYSTOSCOPY W/ URETERAL STENT PLACEMENT Bilateral 02/12/2020   Procedure: CYSTOSCOPY WITH RETROGRADE PYELOGRAM/URETERAL STENT PLACEMENT;  Surgeon: Irine Seal, MD;  Location: [facility];  Service: Urology;  Laterality: Bilateral;  . LITHOTRIPSY  02/2020  . REFRACTIVE SURGERY     Hx: of  . RETINAL DETACHMENT SURGERY    . TOTAL KNEE ARTHROPLASTY Left 02/01/2018   Procedure: TOTAL KNEE ARTHROPLASTY;  Surgeon: Carole Civil, MD;  Location: [facility];  Service: Orthopedics;  Laterality: Left;  . TRANSURETHRAL RESECTION OF BLADDER TUMOR Right 07/27/2015  Procedure: TRANSURETHRAL RESECTION OF BLADDER TUMOR (TURBT);  Surgeon: [name], MD;  Location: [facility];  Service: Urology;  Laterality: Right;  . wisdom tooth extraction      FAMILY HISTORY: Family History  Problem Relation Age of Onset  . Hypertension Father   . Cancer Father        Prostate  . Depression Mother   . Hypertension Mother   . Alcohol abuse Mother   . COPD Mother        passed 07-08-17  . Hypertension Sister   . Anxiety disorder Sister   . Alcohol abuse Maternal Grandfather   . ADD / ADHD Neg Hx   . Bipolar disorder Neg Hx   . Dementia Neg Hx   . Drug abuse Neg Hx   . OCD Neg Hx   . Paranoid behavior Neg Hx   . Schizophrenia Neg Hx   . Seizures Neg Hx   . Sexual abuse Neg Hx   . Physical abuse Neg Hx     SOCIAL HISTORY:  Social History   Socioeconomic History  . Marital status: Married    Spouse name: [name]   . Number of children: 0  . Years of education: 14  . Highest education level: Not on file  Occupational History  .  Occupation: Retired    Fish farm manager: UNEMPLOYED  Tobacco Use  . Smoking status: Former Smoker    Quit date: 12/30/1976    Years since quitting: 43.8  . Smokeless tobacco: Never Used  . Tobacco comment: 1985  Vaping Use  . Vaping Use: Never used  Substance and Sexual Activity  . Alcohol use: No    Comment: quit 1990  . Drug use: No  . Sexual activity: Yes  Other Topics Concern  . Not on file  Social History Narrative   Lives with wife    caffeine- sodas   Social Determinants of Health   Financial Resource Strain:   . Difficulty of Paying Living Expenses: Not on file  Food Insecurity:   . Worried About Charity fundraiser in the Last Year: Not on file  . Ran Out of Food in the Last Year: Not on file  Transportation Needs:   . Lack of Transportation (Medical): Not on file  . Lack of Transportation (Non-Medical): Not on file  Physical Activity:   . Days of Exercise per Week: Not on file  . Minutes of Exercise per Session: Not on file  Stress:   . Feeling of Stress : Not on file  Social Connections:   . Frequency of Communication with Friends and Family: Not on file  . Frequency of Social Gatherings with Friends and Family: Not on file  . Attends Religious Services: Not on file  . Active Member of Clubs or Organizations: Not on file  . Attends Archivist Meetings: Not on file  . Marital Status: Not on file  Intimate Partner Violence:   . Fear of Current or Ex-Partner: Not on file  . Emotionally Abused: Not on file  . Physically Abused: Not on file  . Sexually Abused: Not on file     PHYSICAL EXAM  GENERAL EXAM/CONSTITUTIONAL: Vitals:  Vitals:   10/15/20 0916  BP: 133/67  Pulse: 60  Weight: 215 lb (97.5 kg)  Height: 6\' 1"  (1.854 m)   Body mass index is 28.37 kg/m. No exam data present  Patient is in no distress; well developed, nourished and groomed; neck is supple  FLAT AFFECT, MASKED FACIES  CARDIOVASCULAR:  Examination of carotid arteries is  normal; no carotid bruits  Regular rate and rhythm, no murmurs  Examination of peripheral vascular system by observation and palpation is normal  EYES:  Ophthalmoscopic exam of optic discs and posterior segments is normal; no papilledema or hemorrhages  MUSCULOSKELETAL:  Gait, strength, tone, movements noted in Neurologic exam below  NEUROLOGIC: MENTAL STATUS:  MMSE - Mini Mental State Exam 10/10/2019 09/27/2018 12/22/2017  Orientation to time 5 5 5   Orientation to Place 4 5 5   Registration 3 3 3   Attention/ Calculation 5 5 5   Recall 3 3 3   Language- name 2 objects 2 2 2   Language- repeat 0 1 1  Language- follow 3 step command 3 2 3   Language- follow 3 step command-comments - handed paper back to RN -  Language- read & follow direction 1 1 1   Write a sentence 1 1 1   Copy design 1 1 1   Total score 28 29 30     awake, alert, oriented to person, place and time  recent and remote memory intact  normal attention and concentration  language fluent, comprehension intact, naming intact,   fund of knowledge appropriate  CRANIAL NERVE:   2nd - no papilledema on fundoscopic exam  2nd, 3rd, 4th, 6th - pupils equal and reactive to light, visual fields full to confrontation, extraocular muscles intact, no nystagmus  5th - facial sensation symmetric  7th - facial strength symmetric  8th - hearing intact  9th - palate elevates symmetrically, uvula midline  11th - shoulder shrug symmetric  12th - tongue protrusion midline  SOFT HOARSE VOICE  MASKED FACIES  MOTOR:   normal bulk; full strength in the BUE, BLE  NO BRADYKINESIA; NO RIGIDITY  NO RESTING TREMOR   SENSORY:   normal and symmetric to light touch, temperature, vibration; EXCEPT DECR IN FEET  COORDINATION:   finger-nose-finger, fine finger movements SLOW  REFLEXES:   deep tendon reflexes TRACE and symmetric  GAIT/STATION:   narrow based gait; DECR ARM SWING; STOOPED POSTURE     DIAGNOSTIC  DATA (LABS, IMAGING, TESTING) - I reviewed patient records, labs, notes, testing and imaging myself where available.  Lab Results  Component Value Date   WBC 8.5 02/13/2020   HGB 12.6 (L) 02/13/2020   HCT 38.3 (L) 02/13/2020   MCV 91.6 02/13/2020   PLT 201 02/13/2020      Component Value Date/Time   NA 138 02/13/2020 0429   K 4.9 02/13/2020 0429   CL 104 02/13/2020 0429   CO2 25 02/13/2020 0429   GLUCOSE 153 (H) 02/13/2020 0429   BUN 29 (H) 02/13/2020 0429   CREATININE 1.47 (H) 02/13/2020 0429   CALCIUM 8.4 (L) 02/13/2020 0429   PROT 7.4 02/09/2020 1846   ALBUMIN 3.9 02/09/2020 1846   AST 27 02/09/2020 1846   ALT 7 02/09/2020 1846   ALKPHOS 97 02/09/2020 1846   BILITOT 0.6 02/09/2020 1846   GFRNONAA 47 (L) 02/13/2020 0429   GFRAA 54 (L) 02/13/2020 0429   No results found for: CHOL, HDL, LDLCALC, LDLDIRECT, TRIG, CHOLHDL Lab Results  Component Value Date   HGBA1C 5.8 (H) 07/22/2016   No results found for: VITAMINB12 No results found for: TSH  01/28/12 MRI brain  - Nonspecific slightly atypical white matter type changes as noted above. - No internal auditory canal or skull base abnormality is noted.  01/28/12 MRA head - Intracranial atherosclerotic type changes predominant involving branch vessels as detailed above.  01/28/12 MRV head -  Loss of signal of the left transverse sinus probably is related to artifact rather than true stenosis. Overall, the major dural sinuses appear patent.   05/09/13 MRI cervical [I reviewed images myself and agree with interpretation. -VRP]  - Multilevel cervical spondylosis detailed above.  Progressive C3-C4 degenerative disc disease with flattening of the cervical cord. Moderate to severe central stenosis with 7 mm AP diameter of the thecal sac.  No cord edema.  C3-C4 further collapse of the disc space and the extrusion appears slightly larger than on the prior exam.  Other levels appear similar with multilevel mild central stenosis and  bilateral foraminal stenosis.  In this patient with left upper extremity radiculopathy, the left-sided foraminal stenosis is most pronounced at C6-C7, potentially affecting the left C7 nerve.  06/12/16 MRI lumbar spine [I reviewed images myself and agree with interpretation. -VRP]  - Severe stenosis at L3-4 is multifactorial, related to central protrusion, posterior element hypertrophy and short pedicles affecting both L4 nerve roots. - 2 mm slip L4-5 associated with a central and leftward protrusion also with posterior element hypertrophy and short pedicles. Moderate to severe stenosis along with LEFT greater than RIGHT L5 nerve root impingement.  - No recurrent leftward protrusion at the L5-S1 level, however a central and rightward protrusion at L5-S1 could affect the RIGHT S1 nerve root.  12/23/16 CT head [I reviewed images myself and agree with interpretation. -VRP]  - Mild chronic ischemic white matter disease. No acute intracranial abnormality seen.  12/23/16 CT cervical [I reviewed images myself and agree with interpretation. -VRP]  - Postsurgical and degenerative changes as described above. No acute abnormality seen in the cervical spine.  01/23/17 MRI of the brain without contrast shows the following: 1.     Moderate cortical atrophy, more pronounced in the mesial temporal lobes, which has significantly progressed when compared to the 01/28/2012 MRI. 2.    Mild to moderate chronic microvascular ischemic changes, mildly progressed when compared to the previous MRI. 3.    There are no acute findings.  01/23/17 MRI of the cervical spine without contrast shows the following: 1.    Since the MRI of the cervical spine dated 05/09/2013, the patient has undergone C3-C4 ACDF with resolution of the spinal stenosis noted at that time. 2.    There are multilevel degenerative changes resulting in mild spinal stenosis at C4-C5. Our various degrees of mild-to-moderate foraminal narrowing as detailed above  that did not lead to nerve root compression. 3.    The spinal cord appears normal. 4.    There are no acute findings.     ASSESSMENT AND PLAN  74 y.o. year old male here with progressive memory problems, short-term recall issues, cognitive difficulty, balance and walking problems, tremor. Also with signs and symptoms of parkinsonism. Other contributing factors could include depression, chronic pain, degenerative spine disease, aging and deconditioning.  Ddx memory loss: depression, chronic pain, life stress, neurodegenerative, hearing loss   Ddx balance difficulty: parkinsonism + cervical myelopathy sequelae + lumbar spinal stenosis   1. Parkinsonism, unspecified Parkinsonism type ([place])   2. Gait difficulty      PLAN:  PARKINSON'S DISESASE (stable) - on on carb/levo 1 tab daily; ok to try coming off carb/levo to see if still necessary - continue PT exercises - fall precautions reviewed  GAIT DIFFICULTY - stable; related to cervical and lumbar spine disease and sequelae; some mild parkinsonism as well   MEMORY LOSS - stable; monitor - continue hearing aids and hearing testing  DEPRESSION / ANXIETY - improved; continue treatments per psychiatry  MARITAL STRAIN - stable  Return for pending if symptoms worsen or fail to improve, return to PCP.    Penni Bombard, MD 89/34/0684, 0:33 AM Certified in Neurology, Neurophysiology and Neuroimaging  [facility] [address], [place], [place] [address] [phone]

## 2020-10-15 NOTE — Patient Instructions (Signed)
-   ok to stop carb/levo  - caution with balance; continue PT exercises

## 2020-10-17 DIAGNOSIS — H903 Sensorineural hearing loss, bilateral: Secondary | ICD-10-CM | POA: Diagnosis not present

## 2020-10-17 DIAGNOSIS — G20 Parkinson's disease: Secondary | ICD-10-CM | POA: Diagnosis not present

## 2020-10-17 DIAGNOSIS — G253 Myoclonus: Secondary | ICD-10-CM | POA: Diagnosis not present

## 2020-10-17 DIAGNOSIS — R2689 Other abnormalities of gait and mobility: Secondary | ICD-10-CM | POA: Diagnosis not present

## 2020-10-21 DIAGNOSIS — Z96652 Presence of left artificial knee joint: Secondary | ICD-10-CM | POA: Diagnosis not present

## 2020-10-21 DIAGNOSIS — M461 Sacroiliitis, not elsewhere classified: Secondary | ICD-10-CM | POA: Diagnosis not present

## 2020-10-21 DIAGNOSIS — Z712 Person consulting for explanation of examination or test findings: Secondary | ICD-10-CM | POA: Diagnosis not present

## 2020-10-21 DIAGNOSIS — R946 Abnormal results of thyroid function studies: Secondary | ICD-10-CM | POA: Diagnosis not present

## 2020-10-21 DIAGNOSIS — L989 Disorder of the skin and subcutaneous tissue, unspecified: Secondary | ICD-10-CM | POA: Diagnosis not present

## 2020-10-21 DIAGNOSIS — Z87442 Personal history of urinary calculi: Secondary | ICD-10-CM | POA: Diagnosis not present

## 2020-10-21 DIAGNOSIS — Z2829 Immunization not carried out because of patient decision for other reason: Secondary | ICD-10-CM | POA: Diagnosis not present

## 2020-10-21 DIAGNOSIS — I10 Essential (primary) hypertension: Secondary | ICD-10-CM | POA: Diagnosis not present

## 2020-10-21 DIAGNOSIS — E782 Mixed hyperlipidemia: Secondary | ICD-10-CM | POA: Diagnosis not present

## 2020-10-21 DIAGNOSIS — R7301 Impaired fasting glucose: Secondary | ICD-10-CM | POA: Diagnosis not present

## 2020-10-21 DIAGNOSIS — M6530 Trigger finger, unspecified finger: Secondary | ICD-10-CM | POA: Diagnosis not present

## 2020-10-21 DIAGNOSIS — E1169 Type 2 diabetes mellitus with other specified complication: Secondary | ICD-10-CM | POA: Diagnosis not present

## 2020-10-24 DIAGNOSIS — R000 Tachycardia, unspecified: Secondary | ICD-10-CM | POA: Diagnosis not present

## 2020-10-24 DIAGNOSIS — Z0001 Encounter for general adult medical examination with abnormal findings: Secondary | ICD-10-CM | POA: Diagnosis not present

## 2020-10-24 DIAGNOSIS — Z87442 Personal history of urinary calculi: Secondary | ICD-10-CM | POA: Diagnosis not present

## 2020-10-24 DIAGNOSIS — F419 Anxiety disorder, unspecified: Secondary | ICD-10-CM | POA: Diagnosis not present

## 2020-10-24 DIAGNOSIS — F339 Major depressive disorder, recurrent, unspecified: Secondary | ICD-10-CM | POA: Diagnosis not present

## 2020-10-24 DIAGNOSIS — R944 Abnormal results of kidney function studies: Secondary | ICD-10-CM | POA: Diagnosis not present

## 2020-10-24 DIAGNOSIS — I10 Essential (primary) hypertension: Secondary | ICD-10-CM | POA: Diagnosis not present

## 2020-10-24 DIAGNOSIS — R946 Abnormal results of thyroid function studies: Secondary | ICD-10-CM | POA: Diagnosis not present

## 2020-10-24 DIAGNOSIS — Z96652 Presence of left artificial knee joint: Secondary | ICD-10-CM | POA: Diagnosis not present

## 2020-10-24 DIAGNOSIS — E782 Mixed hyperlipidemia: Secondary | ICD-10-CM | POA: Diagnosis not present

## 2020-10-24 DIAGNOSIS — Z8551 Personal history of malignant neoplasm of bladder: Secondary | ICD-10-CM | POA: Diagnosis not present

## 2020-10-24 DIAGNOSIS — G20 Parkinson's disease: Secondary | ICD-10-CM | POA: Diagnosis not present

## 2020-10-24 DIAGNOSIS — E1169 Type 2 diabetes mellitus with other specified complication: Secondary | ICD-10-CM | POA: Diagnosis not present

## 2020-11-04 ENCOUNTER — Other Ambulatory Visit (HOSPITAL_COMMUNITY): Payer: Self-pay | Admitting: Psychiatry

## 2020-11-04 DIAGNOSIS — J019 Acute sinusitis, unspecified: Secondary | ICD-10-CM | POA: Diagnosis not present

## 2020-11-04 NOTE — Telephone Encounter (Signed)
Patient stated he will call office back to resch appt.

## 2020-11-04 NOTE — Telephone Encounter (Signed)
Call for appt, seen 18 mo ago

## 2020-11-06 ENCOUNTER — Telehealth (INDEPENDENT_AMBULATORY_CARE_PROVIDER_SITE_OTHER): Payer: PPO | Admitting: Psychiatry

## 2020-11-06 ENCOUNTER — Other Ambulatory Visit: Payer: Self-pay

## 2020-11-06 ENCOUNTER — Encounter (HOSPITAL_COMMUNITY): Payer: Self-pay | Admitting: Psychiatry

## 2020-11-06 DIAGNOSIS — F331 Major depressive disorder, recurrent, moderate: Secondary | ICD-10-CM

## 2020-11-06 MED ORDER — DULOXETINE HCL 60 MG PO CPEP: ORAL_CAPSULE | ORAL | 3 refills | Status: DC

## 2020-11-06 NOTE — Progress Notes (Signed)
Virtual Visit via Telephone Note  I connected with Zachary [name] on 11/06/20 at  9:40 AM EST by telephone and verified that I am speaking with the correct person using two identifiers.  Location: Patient: home Provider: home   I discussed the limitations, risks, security and privacy concerns of performing an evaluation and management service by telephone and the availability of in person appointments. I also discussed with the patient that there may be a patient responsible charge related to this service. The patient expressed understanding and agreed to proceed.    I discussed the assessment and treatment plan with the patient. The patient was provided an opportunity to ask questions and all were answered. The patient agreed with the plan and demonstrated an understanding of the instructions.   The patient was advised to call back or seek an in-person evaluation if the symptoms worsen or if the condition fails to improve as anticipated.  I provided 15 minutes of non-face-to-face time during this encounter.   [name], MD  [facility] MD/PA/NP OP Progress Note  11/06/2020 9:51 AM Zachary [name]  MRN:  [id number]  Chief Complaint:  Chief Complaint    Anxiety; Follow-up; Depression     WIO:MBTD patient is a 74year-old married white male who lives with his wife in [place] they have no children. 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   The patient states he got depressed several years ago after his diagnosed with stage IV bladder cancer. He was seeing a psychologist here to deal with this and last year was placed on Lexapro. His cancer is gone now after 5 surgeries.  Patient returns for follow-up after about 18 months. He states that he has been doing well. He does have mild Parkinson's disease but it's not limiting him in any way right now. He states that his mood is good and he is staying very busy doing yard work for himself and other people. He is sleeping  well at night. He denies depressed mood anxiety or difficulties with cognition or memory right now. He has stopped taking the Valium and doesn't feel that he needs it but the Cymbalta continues to maintain stability of mood for him. Visit Diagnosis:    ICD-10-CM   1. Major depressive disorder, recurrent episode, moderate (HCC)  F33.1     Past Psychiatric History: Outpatient treatment for depression and anxiety secondary to medical issues for the last several years  Past Medical History:  Past Medical History:  Diagnosis Date  . Anxiety    takes Valium as needed  . Arthritis   . Bladder cancer ([place])    takes Rapaflo daily  . Blood dyscrasia    07/08/16: pt told he was a "free bleeder" after bleeding a lot when derm cut off mole on forehead. No excessive bleeding with minor wounds at home, no bleeding problems perioperatively with prior surgeries.  . Chronic back pain    spondylolisthesis  . Cluster headaches   . Depression    takes Lexapro daily  . Essential hypertension, benign    takes Diovan-HCT daily  . Hearing loss    hearing aids  . Hx of complications due to general anesthesia    Aborted surgery 07/15/2016 due to hypotension per pt  . Joint pain   . Kidney stone 03/2019   passed stone  . Obstructive sleep apnea on CPAP    uses CPAP @ night  . Parkinsonism ([place])   . Pneumonia    "  several times"--last time about 3-4 yrs ago  . Prediabetes   . Thyroid condition     Past Surgical History:  Procedure Laterality Date  . ANTERIOR CERVICAL DECOMP/DISCECTOMY FUSION N/A 06/05/2013   Procedure:  C3-4 Anterior Cervical Discectomy and Fusion, Allograft, Plate;  Surgeon: [name], MD;  Location: [place];  Service: Orthopedics;  Laterality: N/A;  C3-4 Anterior Cervical Discectomy and Fusion, Allograft, Plate  . Arthroscopic knee surgery    . BACK SURGERY      x 2  . BLADDER SURGERY     x 5 to remove tumor  . CARPAL TUNNEL RELEASE    . CATARACT EXTRACTION W/PHACO  12/19/2012    Procedure: CATARACT EXTRACTION PHACO AND INTRAOCULAR LENS PLACEMENT (IOC);  Surgeon: [name], MD;  [facility];  Service: Ophthalmology;  Laterality: Left;  CDE: 26.80  . CATARACT EXTRACTION W/PHACO  01/09/2013   Procedure: CATARACT EXTRACTION PHACO AND INTRAOCULAR LENS PLACEMENT (IOC);  Surgeon: [name], MD;  Location: [facility];  Service: Ophthalmology;  Laterality: Right;  CDE:22.83  . CERVICAL FUSION  06/05/2013   C 3  C4  . CYSTOSCOPY    . CYSTOSCOPY W/ URETERAL STENT PLACEMENT Bilateral 02/12/2020   Procedure: CYSTOSCOPY WITH RETROGRADE PYELOGRAM/URETERAL STENT PLACEMENT;  Surgeon: Irine Seal, MD;  [facility];  Service: Urology;  Laterality: Bilateral;  . LITHOTRIPSY  02/2020  . REFRACTIVE SURGERY     Hx: of  . RETINAL DETACHMENT SURGERY    . TOTAL KNEE ARTHROPLASTY Left 02/01/2018   Procedure: TOTAL KNEE ARTHROPLASTY;  Surgeon: Carole Civil, MD;  [facility];  Service: Orthopedics;  Laterality: Left;  . TRANSURETHRAL RESECTION OF BLADDER TUMOR Right 07/27/2015   Procedure: TRANSURETHRAL RESECTION OF BLADDER TUMOR (TURBT);  Surgeon: [name], MD;  [facility];  Service: Urology;  Laterality: Right;  . wisdom tooth extraction      Family Psychiatric History: see below  Family History:  Family History  Problem Relation Age of Onset  . Hypertension Father   . Cancer Father        Prostate  . Depression Mother   . Hypertension Mother   . Alcohol abuse Mother   . COPD Mother        passed 07-08-17  . Hypertension Sister   . Anxiety disorder Sister   . Alcohol abuse Maternal Grandfather   . ADD / ADHD Neg Hx   . Bipolar disorder Neg Hx   . Dementia Neg Hx   . Drug abuse Neg Hx   . OCD Neg Hx   . Paranoid behavior Neg Hx   . Schizophrenia Neg Hx   . Seizures Neg Hx   . Sexual abuse Neg Hx   . Physical abuse Neg Hx     Social History:  Social History   Socioeconomic History  . Marital status: Married    Spouse name: [name]   . Number  of children: 0  . Years of education: 8  . Highest education level: Not on file  Occupational History  . Occupation: Retired    Fish farm manager: UNEMPLOYED  Tobacco Use  . Smoking status: Former Smoker    Quit date: 12/30/1976    Years since quitting: 43.8  . Smokeless tobacco: Never Used  . Tobacco comment: 1985  Vaping Use  . Vaping Use: Never used  Substance and Sexual Activity  . Alcohol use: No    Comment: quit 1990  . Drug use: No  . Sexual activity: Yes  Other  Topics Concern  . Not on file  Social History Narrative   Lives with wife    caffeine- sodas   Social Determinants of Health   Financial Resource Strain:   . Difficulty of Paying Living Expenses: Not on file  Food Insecurity:   . Worried About Charity fundraiser in the Last Year: Not on file  . Ran Out of Food in the Last Year: Not on file  Transportation Needs:   . Lack of Transportation (Medical): Not on file  . Lack of Transportation (Non-Medical): Not on file  Physical Activity:   . Days of Exercise per Week: Not on file  . Minutes of Exercise per Session: Not on file  Stress:   . Feeling of Stress : Not on file  Social Connections:   . Frequency of Communication with Friends and Family: Not on file  . Frequency of Social Gatherings with Friends and Family: Not on file  . Attends Religious Services: Not on file  . Active Member of Clubs or Organizations: Not on file  . Attends Archivist Meetings: Not on file  . Marital Status: Not on file    Allergies:  Allergies  Allergen Reactions  . Sulfonamide Derivatives Itching, Rash and Other (See Comments)    Metabolic Disorder Labs: Lab Results  Component Value Date   HGBA1C 5.8 (H) 07/22/2016   MPG 120 07/22/2016   No results found for: PROLACTIN No results found for: CHOL, TRIG, HDL, CHOLHDL, VLDL, LDLCALC No results found for: TSH  Therapeutic Level Labs: No results found for: LITHIUM No results found for: VALPROATE No components  found for:  CBMZ  Current Medications: Current Outpatient Medications  Medication Sig Dispense Refill  . acetaminophen (TYLENOL) 500 MG tablet Take 1,000 mg by mouth at bedtime as needed (for pain.).     Marland Kitchen DULoxetine (CYMBALTA) 60 MG capsule TAKE 1 CAPSULE(60 MG) BY MOUTH DAILY 90 capsule 3  . ibuprofen (ADVIL) 200 MG tablet Take 200 mg by mouth every 6 (six) hours as needed.    . meloxicam (MOBIC) 15 MG tablet Take 15 mg by mouth daily.    . methimazole (TAPAZOLE) 5 MG tablet Take 5 mg by mouth every morning.     . ondansetron (ZOFRAN ODT) 4 MG disintegrating tablet Take 1 tablet (4 mg total) by mouth every 8 (eight) hours as needed for nausea or vomiting. (Patient not taking: Reported on 10/15/2020) 8 tablet 0  . pravastatin (PRAVACHOL) 10 MG tablet Take 10 mg by mouth at bedtime.    . tamsulosin (FLOMAX) 0.4 MG CAPS capsule Take 1 capsule (0.4 mg total) by mouth daily after breakfast. (Patient not taking: Reported on 10/15/2020) 10 capsule 0  . valsartan-hydrochlorothiazide (DIOVAN-HCT) 320-25 MG tablet Take 1 tablet by mouth daily.      No current facility-administered medications for this visit.     Musculoskeletal: Strength & Muscle Tone: within normal limits Gait & Station: normal Patient leans: N/A  Psychiatric Specialty Exam: Review of Systems  Musculoskeletal: Positive for arthralgias and back pain.  All other systems reviewed and are negative.   There were no vitals taken for this visit.There is no height or weight on file to calculate BMI.  General Appearance: NA  Eye Contact:  NA  Speech:  Clear and Coherent  Volume:  Normal  Mood:  Euthymic  Affect:  NA  Thought Process:  Goal Directed  Orientation:  Full (Time, Place, and Person)  Thought Content: WDL   Suicidal Thoughts:  No  Homicidal Thoughts:  No  Memory:  Immediate;   Good Recent;   Good Remote;   Good  Judgement:  Good  Insight:  Fair  Psychomotor Activity:  Normal  Concentration:  Concentration:  Good and Attention Span: Good  Recall:  Good  Fund of Knowledge: Good  Language: Good  Akathisia:  No  Handed:  Right  AIMS (if indicated): not done  Assets:  Communication Skills Desire for Improvement Resilience Social Support Talents/Skills  ADL's:  Intact  Cognition: WNL  Sleep:  Good   Screenings: Mini-Mental     Office Visit from 10/15/2020 in [facility] Office Visit from 10/10/2019 in [facility] Office Visit from 09/27/2018 in [facility] Office Visit from 12/22/2017 in [facility] Office Visit from 09/21/2017 in [facility]  Total Score (max 30 points ) 27 28 29 30 28     PHQ2-9     Office Visit from 09/20/2018 in [facility]  PHQ-2 Total Score 0       Assessment and Plan: This patient is a 74 year old male with a history of depression anxiety he is doing very well on just the Cymbalta. He will continue Cymbalta 60 mg daily for depression. He'll return to see me in 6 months   [name], MD 11/06/2020, 9:51 AM

## 2020-11-19 DIAGNOSIS — I10 Essential (primary) hypertension: Secondary | ICD-10-CM | POA: Diagnosis not present

## 2020-11-19 DIAGNOSIS — L989 Disorder of the skin and subcutaneous tissue, unspecified: Secondary | ICD-10-CM | POA: Diagnosis not present

## 2020-11-19 DIAGNOSIS — R946 Abnormal results of thyroid function studies: Secondary | ICD-10-CM | POA: Diagnosis not present

## 2020-11-19 DIAGNOSIS — E1169 Type 2 diabetes mellitus with other specified complication: Secondary | ICD-10-CM | POA: Diagnosis not present

## 2020-11-19 DIAGNOSIS — Z87442 Personal history of urinary calculi: Secondary | ICD-10-CM | POA: Diagnosis not present

## 2020-11-19 DIAGNOSIS — M6530 Trigger finger, unspecified finger: Secondary | ICD-10-CM | POA: Diagnosis not present

## 2020-11-19 DIAGNOSIS — M461 Sacroiliitis, not elsewhere classified: Secondary | ICD-10-CM | POA: Diagnosis not present

## 2020-11-19 DIAGNOSIS — R7301 Impaired fasting glucose: Secondary | ICD-10-CM | POA: Diagnosis not present

## 2020-11-19 DIAGNOSIS — E782 Mixed hyperlipidemia: Secondary | ICD-10-CM | POA: Diagnosis not present

## 2020-11-19 DIAGNOSIS — Z2829 Immunization not carried out because of patient decision for other reason: Secondary | ICD-10-CM | POA: Diagnosis not present

## 2020-11-19 DIAGNOSIS — Z96652 Presence of left artificial knee joint: Secondary | ICD-10-CM | POA: Diagnosis not present

## 2020-11-19 DIAGNOSIS — Z712 Person consulting for explanation of examination or test findings: Secondary | ICD-10-CM | POA: Diagnosis not present

## 2020-12-10 DIAGNOSIS — M79604 Pain in right leg: Secondary | ICD-10-CM | POA: Diagnosis not present

## 2020-12-25 DIAGNOSIS — J019 Acute sinusitis, unspecified: Secondary | ICD-10-CM | POA: Diagnosis not present

## 2020-12-25 DIAGNOSIS — R051 Acute cough: Secondary | ICD-10-CM | POA: Diagnosis not present

## 2021-01-14 ENCOUNTER — Telehealth (HOSPITAL_COMMUNITY): Payer: Self-pay | Admitting: Occupational Therapy

## 2021-01-14 NOTE — Telephone Encounter (Signed)
Shayla from [facility] called and requested pt's evaluation note. Pt declined PT when office called to schedule. Shayla requested referral notes. Faxed referral and phone notes to [facility] at [phone].  Guadelupe Sabin, OTR/L  [phone] 01/14/21

## 2021-01-16 ENCOUNTER — Other Ambulatory Visit: Payer: Self-pay

## 2021-01-16 ENCOUNTER — Encounter (HOSPITAL_COMMUNITY): Payer: Self-pay | Admitting: *Deleted

## 2021-01-16 ENCOUNTER — Telehealth: Payer: Self-pay | Admitting: Diagnostic Neuroimaging

## 2021-01-16 ENCOUNTER — Telehealth (HOSPITAL_COMMUNITY): Payer: Self-pay | Admitting: Psychiatry

## 2021-01-16 ENCOUNTER — Emergency Department (HOSPITAL_COMMUNITY)
Admission: EM | Admit: 2021-01-16 | Discharge: 2021-01-16 | Disposition: A | Discharge status: Home / Self Care | Payer: PPO | Attending: Emergency Medicine | Admitting: Emergency Medicine

## 2021-01-16 DIAGNOSIS — R531 Weakness: Secondary | ICD-10-CM | POA: Insufficient documentation

## 2021-01-16 DIAGNOSIS — R55 Syncope and collapse: Secondary | ICD-10-CM | POA: Insufficient documentation

## 2021-01-16 DIAGNOSIS — Z5321 Procedure and treatment not carried out due to patient leaving prior to being seen by health care provider: Secondary | ICD-10-CM | POA: Diagnosis not present

## 2021-01-16 LAB — CBG MONITORING, ED: Glucose-Capillary: 102 mg/dL — ABNORMAL HIGH (ref 70–99)

## 2021-01-16 NOTE — Telephone Encounter (Signed)
Called the wife back, on her number. There was no answer. Left a detailed message advising that unfortunately we have no way of getting the patient worked in today. (Dr Leta Baptist is not here).  Advised that even if we were, the patient would require certain testing to further evaluate in which we would not be able to do here in our office.  If in fact patient is having symptoms of a stroke patient should be seen in the ED, where they can complete a thorough work-up and provide appropriate immediate treatments.

## 2021-01-16 NOTE — ED Triage Notes (Signed)
States he had an episode of weakness and brief loss of consciousness at 10:30 am. Advised BY ems to come to the hospital for evaluation at that time.

## 2021-01-16 NOTE — ED Notes (Signed)
Left at 1626

## 2021-01-16 NOTE — Telephone Encounter (Signed)
Pt.'s wife Mardene Celeste is on [place]. She states EMS thinks husband maybe had a mini stroke & he refuses to go to the hospital. She is wondering if he can possibly be seen today. Please advise.
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
## 2021-01-16 NOTE — Telephone Encounter (Signed)
Called to schedule f/u appt, left vm 

## 2021-01-17 DIAGNOSIS — E1165 Type 2 diabetes mellitus with hyperglycemia: Secondary | ICD-10-CM | POA: Diagnosis not present

## 2021-01-17 DIAGNOSIS — E7849 Other hyperlipidemia: Secondary | ICD-10-CM | POA: Diagnosis not present

## 2021-01-17 DIAGNOSIS — I10 Essential (primary) hypertension: Secondary | ICD-10-CM | POA: Diagnosis not present

## 2021-01-17 DIAGNOSIS — R55 Syncope and collapse: Secondary | ICD-10-CM | POA: Diagnosis not present

## 2021-01-20 DIAGNOSIS — Z136 Encounter for screening for cardiovascular disorders: Secondary | ICD-10-CM | POA: Diagnosis not present

## 2021-01-20 DIAGNOSIS — I10 Essential (primary) hypertension: Secondary | ICD-10-CM | POA: Diagnosis not present

## 2021-01-20 DIAGNOSIS — E1165 Type 2 diabetes mellitus with hyperglycemia: Secondary | ICD-10-CM | POA: Diagnosis not present

## 2021-01-20 DIAGNOSIS — E7849 Other hyperlipidemia: Secondary | ICD-10-CM | POA: Diagnosis not present

## 2021-01-20 DIAGNOSIS — R55 Syncope and collapse: Secondary | ICD-10-CM | POA: Diagnosis not present

## 2021-01-22 ENCOUNTER — Encounter: Payer: Self-pay | Admitting: Internal Medicine

## 2021-01-22 ENCOUNTER — Telehealth: Payer: Self-pay

## 2021-01-22 ENCOUNTER — Other Ambulatory Visit: Payer: Self-pay

## 2021-01-22 ENCOUNTER — Ambulatory Visit: Payer: PPO | Admitting: Internal Medicine

## 2021-01-22 VITALS — BP 132/72 | HR 88 | Ht 73.0 in | Wt 212.0 lb

## 2021-01-22 DIAGNOSIS — R55 Syncope and collapse: Secondary | ICD-10-CM | POA: Diagnosis not present

## 2021-01-22 NOTE — Progress Notes (Signed)
Cardiology Office Note   Date:  01/22/2021   ID:  [name], [name] 06-10-1946, MRN [id number]  PCP:  Celene Squibb, MD  Cardiologist:   [name], MD   Pt presents for evaluation of syncope      History of Present Illness: Zachary [name] is a 75 y.o. male with a history of HTN, depression, memory problems and Parkinsons referred for syncope   On 01/16/21 he was at [facility] with friends  He had just eaten   Sitting   Got a little lightheaded    Per his wife a friend said he was looking at hands   They spoke to him but he didn't answer.   Then looked up   Did it again, would not respond  Then woke up   A third time his face went to talbe    EMS called.    He went to ED but did not wait to be seen     The pt does not remember much about events  Not sure if his heart was racing    He does say the week prior he was a little dizzy    Since the episode he has not had any more syncope  The pt denies CP   NO palpitations at other times   The pt was seen in [facility] neurology for Parkinsons   He was on carbidopa/levodopa but he was only taking it daily   Stopped   Wife says he had no return appts planned      Current Meds  Medication Sig  . acetaminophen (TYLENOL) 500 MG tablet Take 1,000 mg by mouth at bedtime as needed (for pain.).   Marland Kitchen DULoxetine (CYMBALTA) 60 MG capsule TAKE 1 CAPSULE(60 MG) BY MOUTH DAILY  . ibuprofen (ADVIL) 200 MG tablet Take 200 mg by mouth every 6 (six) hours as needed.  . methimazole (TAPAZOLE) 5 MG tablet Take 5 mg by mouth every morning.   . pravastatin (PRAVACHOL) 10 MG tablet Take 10 mg by mouth at bedtime.  . tamsulosin (FLOMAX) 0.4 MG CAPS capsule Take 1 capsule (0.4 mg total) by mouth daily after breakfast.  . tiZANidine (ZANAFLEX) 4 MG tablet Take 4 mg by mouth every 8 (eight) hours as needed.  . valsartan-hydrochlorothiazide (DIOVAN-HCT) 320-25 MG tablet Take 1 tablet by mouth daily.      Allergies:   Sulfonamide derivatives   Past Medical  History:  Diagnosis Date  . Anxiety    takes Valium as needed  . Arthritis   . Bladder cancer ([place])    takes Rapaflo daily  . Blood dyscrasia    07/08/16: pt told he was a "free bleeder" after bleeding a lot when derm cut off mole on forehead. No excessive bleeding with minor wounds at home, no bleeding problems perioperatively with prior surgeries.  . Chronic back pain    spondylolisthesis  . Cluster headaches   . Depression    takes Lexapro daily  . Essential hypertension, benign    takes Diovan-HCT daily  . Hearing loss    hearing aids  . Hx of complications due to general anesthesia    Aborted surgery 07/15/2016 due to hypotension per pt  . Joint pain   . Kidney stone 03/2019   passed stone  . Obstructive sleep apnea on CPAP    uses CPAP @ night  . Parkinsonism ([place])   . Pneumonia    "several times"--last time about 3-4 yrs ago  . Prediabetes   .  Thyroid condition     Past Surgical History:  Procedure Laterality Date  . ANTERIOR CERVICAL DECOMP/DISCECTOMY FUSION N/A 06/05/2013   Procedure:  C3-4 Anterior Cervical Discectomy and Fusion, Allograft, Plate;  Surgeon: [name], MD;  Location: [facility];  Service: Orthopedics;  Laterality: N/A;  C3-4 Anterior Cervical Discectomy and Fusion, Allograft, Plate  . Arthroscopic knee surgery    . BACK SURGERY      x 2  . BLADDER SURGERY     x 5 to remove tumor  . CARPAL TUNNEL RELEASE    . CATARACT EXTRACTION W/PHACO  12/19/2012   Procedure: CATARACT EXTRACTION PHACO AND INTRAOCULAR LENS PLACEMENT (IOC);  Surgeon: [name], MD;  [facility];  Service: Ophthalmology;  Laterality: Left;  CDE: 26.80  . CATARACT EXTRACTION W/PHACO  01/09/2013   Procedure: CATARACT EXTRACTION PHACO AND INTRAOCULAR LENS PLACEMENT (IOC);  Surgeon: [name], MD;  Location: [facility];  Service: Ophthalmology;  Laterality: Right;  CDE:22.83  . CERVICAL FUSION  06/05/2013   C 3  C4  . CYSTOSCOPY    . CYSTOSCOPY W/ URETERAL STENT PLACEMENT Bilateral  02/12/2020   Procedure: CYSTOSCOPY WITH RETROGRADE PYELOGRAM/URETERAL STENT PLACEMENT;  Surgeon: Irine Seal, MD;  [facility];  Service: Urology;  Laterality: Bilateral;  . LITHOTRIPSY  02/2020  . REFRACTIVE SURGERY     Hx: of  . RETINAL DETACHMENT SURGERY    . TOTAL KNEE ARTHROPLASTY Left 02/01/2018   Procedure: TOTAL KNEE ARTHROPLASTY;  Surgeon: Carole Civil, MD;  [facility];  Service: Orthopedics;  Laterality: Left;  . TRANSURETHRAL RESECTION OF BLADDER TUMOR Right 07/27/2015   Procedure: TRANSURETHRAL RESECTION OF BLADDER TUMOR (TURBT);  Surgeon: [name], MD;  [facility];  Service: Urology;  Laterality: Right;  . wisdom tooth extraction       Social History:  The patient  reports that he quit smoking about 44 years ago. He has never used smokeless tobacco. He reports that he does not drink alcohol and does not use drugs.   Family History:  The patient's family history includes Alcohol abuse in his maternal grandfather and mother; Anxiety disorder in his sister; COPD in his mother; Cancer in his father; Depression in his mother; Hypertension in his father, mother, and sister.    ROS:  Please see the history of present illness. All other systems are reviewed and  Negative to the above problem except as noted.    PHYSICAL EXAM: VS:  Pulse 88   Ht 6\' 1"  (1.854 m)   Wt 212 lb (96.2 kg)   SpO2 95%   BMI 27.97 kg/m   Laying:  BP 126/70  P 91   Sitting  122/70 P 84  Standing:  132/72  P 91  Standing 4 min  136/70  P 97  GEN: Well nourished, well developed, in no acute distress  HEENT: normal  Neck: no JVD, carotid bruits Cardiac: RRR; no murmurs  No LE edema  Respiratory:  clear to auscultation bilaterally GI: soft, nontender, nondistended, + BS  No hepatomegaly  MS: no deformity Moving all extremities   Skin: warm and dry, no rash Neuro:  Deferred  Psych: euthymic mood, full affect   EKG:  EKG is not ordered today.  On 01/17/21  NSR 82 bpm    RBBB   Lipid Panel No results found for: CHOL, TRIG, HDL, CHOLHDL, VLDL, LDLCALC, LDLDIRECT    Wt Readings from Last 3 Encounters:  01/22/21 212 lb (96.2 kg)  10/15/20 215  lb (97.5 kg)  07/10/20 218 lb 8 oz (99.1 kg)      ASSESSMENT AND PLAN:  1  Syncope   PT with spells on 1/20 while at [facility] the week prior  Hx of ? SVT (no records) On exam today the pt is not orthostatic     EKG with RBBB      I would recomm an echo to eval LV/RVfunction; carotid USN; event monitor I also want the pt to make an appt with Dr Leta Baptist in neuro to review Dx of Parkinsons as this may be related  For now the pt should not be driving (6 months from last syncopal event unless a reversible cause for syncope is found)  2  Hx HTN   BP is contrlled   Follow  3  Hx of sleep apnea  Reported on CPAP    4 Parkinson's disorder   Recomm reeval by neuro   LAast seen in fall 2021  No longer on meds (though was not taking as directed)  F/U based on test results     Current medicines are reviewed at length with the patient today.  The patient does not have concerns regarding medicines.  Signed, [name], MD  01/22/2021 3:32 PM    [facility] [place], [place], [place]  [address] Phone: [phone]; Fax: [phone]

## 2021-01-22 NOTE — Patient Instructions (Addendum)
Medication Instructions:  No changes *If you need a refill on your cardiac medications before your next appointment, please call your pharmacy*   Lab Work: none If you have labs (blood work) drawn today and your tests are completely normal, you will receive your results only by: Marland Kitchen MyChart Message (if you have MyChart) OR . A paper copy in the mail If you have any lab test that is abnormal or we need to change your treatment, we will call you to review the results.   Testing/Procedures: Your physician has requested that you have an echocardiogram. Echocardiography is a painless test that uses sound waves to create images of your heart. It provides your doctor with information about the size and shape of your heart and how well your heart's chambers and valves are working. This procedure takes approximately one hour. There are no restrictions for this procedure.  Your physician has recommended that you wear an event monitor. Event monitors are medical devices that record the heart's electrical activity. Doctors most often [place] these monitors to diagnose arrhythmias. Arrhythmias are problems with the speed or rhythm of the heartbeat. The monitor is a small, portable device. You can wear one while you do your normal daily activities. This is usually used to diagnose what is causing palpitations/syncope (passing out).  Your physician has requested that you have a carotid duplex. This test is an ultrasound of the carotid arteries in your neck. It looks at blood flow through these arteries that supply the brain with blood. Allow one hour for this exam. There are no restrictions or special instructions.    Follow-Up: At [facility], you and your health needs are our priority.  As part of our continuing mission to provide you with exceptional heart care, we have created designated Provider Care Teams.  These Care Teams include your primary Cardiologist (physician) and Advanced Practice Providers (APPs  -  Physician Assistants and Nurse Practitioners) who all work together to provide you with the care you need, when you need it.   Your next appointment:   7 month(s)  The format for your next appointment:   In Person  Provider:   You may see [name], MD or one of the following Advanced Practice Providers on your designated Care Team:    Richardson Dopp, PA-C  Robbie Lis, [place]   Other Instructions  Please call and schedule follow up with Dr. Leta Baptist.

## 2021-01-22 NOTE — Telephone Encounter (Signed)
30 day Event Monitor registered to be mailed to pt's home address.  

## 2021-01-23 ENCOUNTER — Telehealth: Payer: Self-pay | Admitting: Diagnostic Neuroimaging

## 2021-01-23 NOTE — Telephone Encounter (Signed)
I reviewed the chart.  Although I did not see a new referral, I recommend that the patient be evaluated by his current neurologist for his new symptoms. I therefore decline the transfer of care at this time.

## 2021-01-23 NOTE — Telephone Encounter (Signed)
This patient is requesting a provider switch to Dr. Rexene Alberts from Dr. Leta Baptist. Patient has been primarily seen in the past for Parkinson's and is having a new referral sent over for new symptoms. Please advise if this is acceptable.

## 2021-01-23 NOTE — Telephone Encounter (Deleted)
Called patient to schedule follow up with Dr Leta Baptist. He stated he preferred to switch providers. I advised him of Dr Guadelupe Sabin denial to switch. He had concerns that we addressed and discussed to his stated satisfaction. He has upcoming ECHO, carotid US and cardiac monitor. We scheduled FU after ECHO and [place]. He  verbalized understanding, appreciation.

## 2021-01-23 NOTE — Telephone Encounter (Addendum)
Called patient to schedule follow up with Dr Penumalli. He stated he preferred to switch providers. I advised him of Dr Athar's denial to switch. He had concerns that we addressed and discussed to his stated satisfaction. He has upcoming ECHO, carotid US and cardiac monitor. We scheduled FU after ECHO and US. He  verbalized understanding, appreciation.   

## 2021-01-23 NOTE — Telephone Encounter (Signed)
Ok to setup follow up appt with me for eval of new symptoms. -VRP

## 2021-01-25 DIAGNOSIS — E1165 Type 2 diabetes mellitus with hyperglycemia: Secondary | ICD-10-CM | POA: Diagnosis not present

## 2021-01-25 DIAGNOSIS — E7849 Other hyperlipidemia: Secondary | ICD-10-CM | POA: Diagnosis not present

## 2021-01-25 DIAGNOSIS — I10 Essential (primary) hypertension: Secondary | ICD-10-CM | POA: Diagnosis not present

## 2021-01-27 ENCOUNTER — Ambulatory Visit (INDEPENDENT_AMBULATORY_CARE_PROVIDER_SITE_OTHER): Payer: PPO

## 2021-01-27 ENCOUNTER — Encounter: Payer: Self-pay | Admitting: Internal Medicine

## 2021-01-27 DIAGNOSIS — Z96652 Presence of left artificial knee joint: Secondary | ICD-10-CM | POA: Diagnosis not present

## 2021-01-27 DIAGNOSIS — E1169 Type 2 diabetes mellitus with other specified complication: Secondary | ICD-10-CM | POA: Diagnosis not present

## 2021-01-27 DIAGNOSIS — R946 Abnormal results of thyroid function studies: Secondary | ICD-10-CM | POA: Diagnosis not present

## 2021-01-27 DIAGNOSIS — Z712 Person consulting for explanation of examination or test findings: Secondary | ICD-10-CM | POA: Diagnosis not present

## 2021-01-27 DIAGNOSIS — M6530 Trigger finger, unspecified finger: Secondary | ICD-10-CM | POA: Diagnosis not present

## 2021-01-27 DIAGNOSIS — M461 Sacroiliitis, not elsewhere classified: Secondary | ICD-10-CM | POA: Diagnosis not present

## 2021-01-27 DIAGNOSIS — R55 Syncope and collapse: Secondary | ICD-10-CM

## 2021-01-27 DIAGNOSIS — L989 Disorder of the skin and subcutaneous tissue, unspecified: Secondary | ICD-10-CM | POA: Diagnosis not present

## 2021-01-27 DIAGNOSIS — N3000 Acute cystitis without hematuria: Secondary | ICD-10-CM | POA: Diagnosis not present

## 2021-01-27 DIAGNOSIS — E782 Mixed hyperlipidemia: Secondary | ICD-10-CM | POA: Diagnosis not present

## 2021-01-27 DIAGNOSIS — I10 Essential (primary) hypertension: Secondary | ICD-10-CM | POA: Diagnosis not present

## 2021-01-27 DIAGNOSIS — Z2829 Immunization not carried out because of patient decision for other reason: Secondary | ICD-10-CM | POA: Diagnosis not present

## 2021-01-27 DIAGNOSIS — E7849 Other hyperlipidemia: Secondary | ICD-10-CM | POA: Diagnosis not present

## 2021-01-27 DIAGNOSIS — R7301 Impaired fasting glucose: Secondary | ICD-10-CM | POA: Diagnosis not present

## 2021-01-27 DIAGNOSIS — E1165 Type 2 diabetes mellitus with hyperglycemia: Secondary | ICD-10-CM | POA: Diagnosis not present

## 2021-01-27 DIAGNOSIS — Z87442 Personal history of urinary calculi: Secondary | ICD-10-CM | POA: Diagnosis not present

## 2021-02-03 ENCOUNTER — Emergency Department (HOSPITAL_COMMUNITY): Payer: PPO

## 2021-02-03 ENCOUNTER — Emergency Department (HOSPITAL_COMMUNITY)
Admission: EM | Admit: 2021-02-03 | Discharge: 2021-02-03 | Disposition: A | Discharge status: Home / Self Care | Payer: PPO | Attending: Emergency Medicine | Admitting: Emergency Medicine

## 2021-02-03 ENCOUNTER — Other Ambulatory Visit: Payer: Self-pay

## 2021-02-03 ENCOUNTER — Encounter (HOSPITAL_COMMUNITY): Payer: Self-pay

## 2021-02-03 DIAGNOSIS — G20 Parkinson's disease: Secondary | ICD-10-CM | POA: Insufficient documentation

## 2021-02-03 DIAGNOSIS — R4020 Unspecified coma: Secondary | ICD-10-CM | POA: Diagnosis not present

## 2021-02-03 DIAGNOSIS — Z7982 Long term (current) use of aspirin: Secondary | ICD-10-CM | POA: Insufficient documentation

## 2021-02-03 DIAGNOSIS — R0689 Other abnormalities of breathing: Secondary | ICD-10-CM | POA: Diagnosis not present

## 2021-02-03 DIAGNOSIS — R55 Syncope and collapse: Secondary | ICD-10-CM | POA: Diagnosis not present

## 2021-02-03 DIAGNOSIS — N179 Acute kidney failure, unspecified: Secondary | ICD-10-CM | POA: Diagnosis not present

## 2021-02-03 DIAGNOSIS — Z8551 Personal history of malignant neoplasm of bladder: Secondary | ICD-10-CM | POA: Insufficient documentation

## 2021-02-03 DIAGNOSIS — Z96652 Presence of left artificial knee joint: Secondary | ICD-10-CM | POA: Diagnosis not present

## 2021-02-03 DIAGNOSIS — R000 Tachycardia, unspecified: Secondary | ICD-10-CM | POA: Diagnosis not present

## 2021-02-03 DIAGNOSIS — Z87891 Personal history of nicotine dependence: Secondary | ICD-10-CM | POA: Diagnosis not present

## 2021-02-03 DIAGNOSIS — Z79899 Other long term (current) drug therapy: Secondary | ICD-10-CM | POA: Diagnosis not present

## 2021-02-03 DIAGNOSIS — I4891 Unspecified atrial fibrillation: Secondary | ICD-10-CM | POA: Diagnosis not present

## 2021-02-03 DIAGNOSIS — I10 Essential (primary) hypertension: Secondary | ICD-10-CM | POA: Insufficient documentation

## 2021-02-03 DIAGNOSIS — I700 Atherosclerosis of aorta: Secondary | ICD-10-CM | POA: Diagnosis not present

## 2021-02-03 LAB — CBC WITH DIFFERENTIAL/PLATELET
Abs Immature Granulocytes: 0.01 10*3/uL (ref 0.00–0.07)
Basophils Absolute: 0.1 10*3/uL (ref 0.0–0.1)
Basophils Relative: 1 %
Eosinophils Absolute: 0.1 10*3/uL (ref 0.0–0.5)
Eosinophils Relative: 2 %
HCT: 38.5 % — ABNORMAL LOW (ref 39.0–52.0)
Hemoglobin: 12.2 g/dL — ABNORMAL LOW (ref 13.0–17.0)
Immature Granulocytes: 0 %
Lymphocytes Relative: 23 %
Lymphs Abs: 1.6 10*3/uL (ref 0.7–4.0)
MCH: 29.2 pg (ref 26.0–34.0)
MCHC: 31.7 g/dL (ref 30.0–36.0)
MCV: 92.1 fL (ref 80.0–100.0)
Monocytes Absolute: 0.5 10*3/uL (ref 0.1–1.0)
Monocytes Relative: 7 %
Neutro Abs: 4.8 10*3/uL (ref 1.7–7.7)
Neutrophils Relative %: 67 %
Platelets: 195 10*3/uL (ref 150–400)
RBC: 4.18 MIL/uL — ABNORMAL LOW (ref 4.22–5.81)
RDW: 13.3 % (ref 11.5–15.5)
WBC: 6.9 10*3/uL (ref 4.0–10.5)
nRBC: 0 % (ref 0.0–0.2)

## 2021-02-03 LAB — COMPREHENSIVE METABOLIC PANEL
ALT: 22 U/L (ref 0–44)
AST: 16 U/L (ref 15–41)
Albumin: 3.5 g/dL (ref 3.5–5.0)
Alkaline Phosphatase: 69 U/L (ref 38–126)
Anion gap: 8 (ref 5–15)
BUN: 35 mg/dL — ABNORMAL HIGH (ref 8–23)
CO2: 26 mmol/L (ref 22–32)
Calcium: 8.6 mg/dL — ABNORMAL LOW (ref 8.9–10.3)
Chloride: 108 mmol/L (ref 98–111)
Creatinine, Ser: 1.62 mg/dL — ABNORMAL HIGH (ref 0.61–1.24)
GFR, Estimated: 44 mL/min — ABNORMAL LOW (ref >60)
Glucose, Bld: 77 mg/dL (ref 70–99)
Potassium: 3.9 mmol/L (ref 3.5–5.1)
Sodium: 142 mmol/L (ref 135–145)
Total Bilirubin: 0.8 mg/dL (ref 0.3–1.2)
Total Protein: 6.7 g/dL (ref 6.5–8.1)

## 2021-02-03 LAB — TROPONIN I (HIGH SENSITIVITY)
Troponin I (High Sensitivity): 4 ng/L (ref <18)
Troponin I (High Sensitivity): 4 ng/L (ref <18)

## 2021-02-03 LAB — TSH: TSH: 2.701 u[IU]/mL (ref 0.350–4.500)

## 2021-02-03 MED ORDER — LACTATED RINGERS IV BOLUS
500.0000 mL | Freq: Once | INTRAVENOUS | Status: AC
  Administered 2021-02-03: 500 mL via INTRAVENOUS

## 2021-02-03 NOTE — ED Notes (Signed)
X Ray at bedside at this time.  

## 2021-02-03 NOTE — ED Notes (Signed)
ED Provider at bedside. 

## 2021-02-03 NOTE — ED Triage Notes (Signed)
Pt arrives via EMS, coming from [place].  Reports that patient had a witnessed syncopal episode.  Reports that patient had a brief episode with EMS of "going in and out" but never lost consciousness.  Pt denies pain, reports that patient's blood pressure with EMS was 80/60.  Arrives with a 22G IV to right hand, received 124mL NS during transport.  During triage, patient is alert and oriented x4.

## 2021-02-03 NOTE — ED Provider Notes (Signed)
[facility] EMERGENCY DEPARTMENT Provider Note   CSN: [id number] Arrival date & time: 02/03/21  1039     History Chief Complaint  Patient presents with  . Loss of Consciousness    Zachary [name] is a 75 y.o. male.  HPI Patient presents after near syncopal/syncopal episode.  Has had episodes over the last few weeks for the same.  Has been seen by Dr. Harrington Challenger from cardiology and was supposed to have follow-up with neurology but has not had it yet.  States that was at [employer] today and had another episode.  States he is wearing a heart monitor.  States that he is feeling somewhat better now.  No urinary incontinence.  No chest pain.  Is wearing heart monitor.  Reportedly had good blood pressure when he was in the office with Dr. Harrington Challenger but is hypotensive for EMS and has had a small fluid bolus.    Also has history of Parkinson's disease.  Past Medical History:  Diagnosis Date  . Anxiety    takes Valium as needed  . Arthritis   . Bladder cancer ([place])    takes Rapaflo daily  . Blood dyscrasia    07/08/16: pt told he was a "free bleeder" after bleeding a lot when derm cut off mole on forehead. No excessive bleeding with minor wounds at home, no bleeding problems perioperatively with prior surgeries.  . Chronic back pain    spondylolisthesis  . Cluster headaches   . Depression    takes Lexapro daily  . Essential hypertension, benign    takes Diovan-HCT daily  . Hearing loss    hearing aids  . Hx of complications due to general anesthesia    Aborted surgery 07/15/2016 due to hypotension per pt  . Joint pain   . Kidney stone 03/2019   passed stone  . Obstructive sleep apnea on CPAP    uses CPAP @ night  . Parkinsonism ([place])   . Pneumonia    "several times"--last time about 3-4 yrs ago  . Prediabetes   . Thyroid condition     Patient Active Problem List   Diagnosis Date Noted  . Left ureteral stone 02/12/2020  . Subclinical hyperthyroidism 09/20/2018  . Prediabetes 09/20/2018   . S/P total knee replacement, 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 02/07/2018  . Primary osteoarthritis of left knee 02/01/2018  . Postural hypotension 07/23/2016  . Dysphagia 07/23/2016  . Obstructive sleep apnea 07/21/2016  . Hyperglycemia 07/21/2016  . Anemia, unspecified 07/21/2016  . Spinal stenosis of lumbar region 07/15/2016  . Bladder tumor 07/27/2015  . Epiretinal membrane (ERM) of left eye 03/14/2014  . H/O retinal detachment 03/14/2014  . Pseudophakia of both eyes 03/14/2014  . Retinal detachment 03/14/2014  . Visual distortion 03/14/2014  . HNP (herniated nucleus pulposus), cervical 06/05/2013    Class: Diagnosis of  . 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  . Muscle spasms of neck 04/04/2013  . 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 OA (osteoarthritis) of knee 02/22/2013  . Iliotibial band syndrome of left side 11/22/2012  . Localized, primary osteoarthritis of the ankle and foot 10/11/2012  . Difficulty in walking(719.7) 08/17/2012  . Peroneal tendinitis 08/16/2012  . Precordial pain 02/09/2012  . Essential hypertension, benign 02/09/2012  . Mixed hyperlipidemia 02/09/2012  . Dysthymia 02/04/2012  . Abnormality of gait 12/23/2011  . Lateral epicondylitis/tennis elbow 05/06/2011  . TENOSYNOVITIS OF FOOT AND ANKLE 10/22/2010    Past Surgical History:  Procedure Laterality Date  . ANTERIOR CERVICAL DECOMP/DISCECTOMY FUSION N/A 06/05/2013  Procedure:  C3-4 Anterior Cervical Discectomy and Fusion, Allograft, Plate;  Surgeon: [name], MD;  Location: [place];  Service: Orthopedics;  Laterality: N/A;  C3-4 Anterior Cervical Discectomy and Fusion, Allograft, Plate  . Arthroscopic knee surgery    . BACK SURGERY      x 2  . BLADDER SURGERY     x 5 to remove tumor  . CARPAL TUNNEL RELEASE    . CATARACT EXTRACTION W/PHACO  12/19/2012   Procedure: CATARACT EXTRACTION PHACO AND INTRAOCULAR LENS PLACEMENT (IOC);  Surgeon: [name], MD;  Location: [facility];  Service: Ophthalmology;   Laterality: Left;  CDE: 26.80  . CATARACT EXTRACTION W/PHACO  01/09/2013   Procedure: CATARACT EXTRACTION PHACO AND INTRAOCULAR LENS PLACEMENT (IOC);  Surgeon: [name], MD;  Location: [facility];  Service: Ophthalmology;  Laterality: Right;  CDE:22.83  . CERVICAL FUSION  06/05/2013   C 3  C4  . CYSTOSCOPY    . CYSTOSCOPY W/ URETERAL STENT PLACEMENT Bilateral 02/12/2020   Procedure: CYSTOSCOPY WITH RETROGRADE PYELOGRAM/URETERAL STENT PLACEMENT;  Surgeon: Irine Seal, MD;  Location: [facility];  Service: Urology;  Laterality: Bilateral;  . LITHOTRIPSY  02/2020  . REFRACTIVE SURGERY     Hx: of  . RETINAL DETACHMENT SURGERY    . TOTAL KNEE ARTHROPLASTY Left 02/01/2018   Procedure: TOTAL KNEE ARTHROPLASTY;  Surgeon: Carole Civil, MD;  Location: [facility];  Service: Orthopedics;  Laterality: Left;  . TRANSURETHRAL RESECTION OF BLADDER TUMOR Right 07/27/2015   Procedure: TRANSURETHRAL RESECTION OF BLADDER TUMOR (TURBT);  Surgeon: [name], MD;  Location: [facility];  Service: Urology;  Laterality: Right;  . wisdom tooth extraction         Family History  Problem Relation Age of Onset  . Hypertension Father   . Cancer Father        Prostate  . Depression Mother   . Hypertension Mother   . Alcohol abuse Mother   . COPD Mother        passed 07-08-17  . Hypertension Sister   . Anxiety disorder Sister   . Alcohol abuse Maternal Grandfather   . ADD / ADHD Neg Hx   . Bipolar disorder Neg Hx   . Dementia Neg Hx   . Drug abuse Neg Hx   . OCD Neg Hx   . Paranoid behavior Neg Hx   . Schizophrenia Neg Hx   . Seizures Neg Hx   . Sexual abuse Neg Hx   . Physical abuse Neg Hx     Social History   Tobacco Use  . Smoking status: Former Smoker    Quit date: 12/30/1976    Years since quitting: 44.1  . Smokeless tobacco: Never Used  . Tobacco comment: 1985  Vaping Use  . Vaping Use: Never used  Substance Use Topics  . Alcohol use: No    Comment: quit 1990  . Drug use: No    Home  Medications Prior to Admission medications   Medication Sig Start Date End Date Taking? Authorizing Provider  acetaminophen (TYLENOL) 500 MG tablet Take 1,000 mg by mouth at bedtime as needed (for pain.).    Yes [provider]  aspirin EC 81 MG tablet Take 81 mg by mouth daily. Swallow whole.   Yes [provider]  DULoxetine (CYMBALTA) 60 MG capsule TAKE 1 CAPSULE(60 MG) BY MOUTH DAILY Patient taking differently: Take 60 mg by mouth daily. 11/06/20  Yes Cloria Spring, MD  ibuprofen (ADVIL) 200 MG tablet Take  200 mg by mouth every 6 (six) hours as needed for fever or mild pain.   Yes [provider]  pravastatin (PRAVACHOL) 10 MG tablet Take 10 mg by mouth at bedtime. 01/23/20  Yes [provider]  Probiotic Product (PROBIOTIC PO) Take 1 tablet by mouth daily.   Yes [provider]  valsartan-hydrochlorothiazide (DIOVAN-HCT) 320-25 MG tablet Take 1 tablet by mouth daily.  08/07/19  Yes [provider]  tamsulosin (FLOMAX) 0.4 MG CAPS capsule Take 1 capsule (0.4 mg total) by mouth daily after breakfast. Patient not taking: Reported on 02/03/2021 02/09/20   Petrucelli, Aldona Bar R, PA-C    Allergies    Sulfonamide derivatives  Review of Systems   Review of Systems  Constitutional: Negative for appetite change, fatigue and fever.  HENT: Negative for congestion.   Respiratory: Negative for shortness of breath.   Cardiovascular: Negative for chest pain, palpitations and leg swelling.  Gastrointestinal: Negative for abdominal pain.  Genitourinary: Negative for flank pain.  Musculoskeletal: Negative for back pain.  Skin: Negative for rash.  Neurological: Positive for light-headedness.  Psychiatric/Behavioral: Negative for confusion.    Physical Exam Updated Vital Signs BP 115/89   Pulse 64   Temp 98.2 F (36.8 C)   Resp (!) 22   Ht 6\' 1"  (1.854 m)   Wt 96.2 kg   SpO2 100%   BMI 27.97 kg/m   Physical Exam Vitals and nursing note  reviewed.  Constitutional:      Appearance: Normal appearance.  HENT:     Head: Normocephalic.  Eyes:     General: No scleral icterus.    Extraocular Movements: Extraocular movements intact.     Pupils: Pupils are equal, round, and reactive to light.  Cardiovascular:     Rate and Rhythm: Regular rhythm.  Pulmonary:     Effort: Pulmonary effort is normal.  Abdominal:     Tenderness: There is no abdominal tenderness.  Musculoskeletal:        General: No tenderness.     Cervical back: Neck supple.  Skin:    General: Skin is warm.     Capillary Refill: Capillary refill takes less than 2 seconds.  Neurological:     General: No focal deficit present.     Mental Status: He is alert and oriented to person, place, and time.  Psychiatric:        Mood and Affect: Mood normal.     ED Results / Procedures / Treatments   Labs (all labs ordered are listed, but only abnormal results are displayed) Labs Reviewed  CBC WITH DIFFERENTIAL/PLATELET - Abnormal; Notable for the following components:      Result Value   RBC 4.18 (*)    Hemoglobin 12.2 (*)    HCT 38.5 (*)    All other components within normal limits  COMPREHENSIVE METABOLIC PANEL - Abnormal; Notable for the following components:   BUN 35 (*)    Creatinine, Ser 1.62 (*)    Calcium 8.6 (*)    GFR, Estimated 44 (*)    All other components within normal limits  TSH  TROPONIN I (HIGH SENSITIVITY)  TROPONIN I (HIGH SENSITIVITY)    EKG EKG Interpretation  Date/Time:  Monday February 03 2021 10:54:44 EST Ventricular Rate:  78 PR Interval:    QRS Duration: 153 QT Interval:  390 QTC Calculation: 445 R Axis:   53 Text Interpretation: Sinus rhythm Right bundle branch block Confirmed by Davonna Belling [phone]) on 02/03/2021 12:18:46 PM   Radiology  DG Chest Portable 1 View  Result Date: 02/03/2021 CLINICAL DATA:  Lightheadedness.  History of bladder cancer. EXAM: PORTABLE CHEST 1 VIEW COMPARISON:  03/26/2018 FINDINGS:  Atherosclerotic calcification of the aortic arch. The lungs appear clear. No blunting of the costophrenic angles. Thoracic spondylosis noted. Degenerative glenohumeral arthropathy bilaterally. IMPRESSION: 1. No acute findings. 2. Atherosclerotic calcification of the aortic arch. Electronically Signed   By: Van Clines M.D.   On: 02/03/2021 11:52    Procedures Procedures   Medications Ordered in ED Medications  lactated ringers bolus 500 mL (0 mLs Intravenous Stopped 02/03/21 1456)    ED Course  I have reviewed the triage vital signs and the nursing notes.  Pertinent labs & imaging results that were available during my care of the patient were reviewed by me and considered in my medical decision making (see chart for details).    MDM Rules/Calculators/A&P                          Patient presents after near syncope/syncope.  History of same.  Has been being worked up as an outpatient for the same.  Currently wearing a heart monitor.  However I was unable to get the monitor interrogated during the time in the ER.  Lab work shows an acute kidney injury.  Likely some dehydration.  Feels better and blood pressure improved after fluid bolus.  Had been seen by cardiology however patient was not willing to stay for that recommendations.  Discharged home without adjustment of the medication.  Has short-term follow-up with his cardiologist however. Final Clinical Impression(s) / ED Diagnoses Final diagnoses:  Near syncope  AKI (acute kidney injury) [facility])    Rx / [place] Orders ED Discharge Orders    None       Davonna Belling, MD 02/04/21 [phone]

## 2021-02-03 NOTE — Discharge Instructions (Signed)
Stop the muscle relaxer for now.  Have Dr. Harrington Challenger follow-up on the heart monitor.  Your creatinine has mildly increased.  Follow-up with your doctors about potential adjustments to your medication.

## 2021-02-03 NOTE — Progress Notes (Signed)
Patient followed closely by Dr Harrington Challenger cardiology as well as neurology for syncope.   We checked with [employer], no arrhythmias noted on heart monitor at time of episode.   Technically orthostatic by diastolic blood pressures, his DBP drops 15 points with position change. Cr and BUN elevated, suggesting possibly dry, initially low bp's on presentation that resolved with IVFs. With Parkinsons certaintly at risk for some autonomic dysfunction as well.  Would d/c the HCTZ portion of his diovan HCT. Keep already scheduled follow up with cardiology and neurology, in absence of acute findings no indication for admission.    Carlyle Dolly MD

## 2021-02-03 NOTE — ED Notes (Signed)

## 2021-02-04 DIAGNOSIS — E1165 Type 2 diabetes mellitus with hyperglycemia: Secondary | ICD-10-CM | POA: Diagnosis not present

## 2021-02-04 DIAGNOSIS — N179 Acute kidney failure, unspecified: Secondary | ICD-10-CM | POA: Diagnosis not present

## 2021-02-04 DIAGNOSIS — Z09 Encounter for follow-up examination after completed treatment for conditions other than malignant neoplasm: Secondary | ICD-10-CM | POA: Diagnosis not present

## 2021-02-04 DIAGNOSIS — E7849 Other hyperlipidemia: Secondary | ICD-10-CM | POA: Diagnosis not present

## 2021-02-04 DIAGNOSIS — R55 Syncope and collapse: Secondary | ICD-10-CM | POA: Diagnosis not present

## 2021-02-04 DIAGNOSIS — I10 Essential (primary) hypertension: Secondary | ICD-10-CM | POA: Diagnosis not present

## 2021-02-05 ENCOUNTER — Encounter (HOSPITAL_COMMUNITY): Payer: PPO

## 2021-02-12 ENCOUNTER — Other Ambulatory Visit (HOSPITAL_COMMUNITY): Payer: PPO

## 2021-02-12 ENCOUNTER — Telehealth: Payer: Self-pay

## 2021-02-12 NOTE — Telephone Encounter (Signed)
Received [employer] Critical monitor alert for 02/12/2021 at 739am.  Alert shows Sinus Rhythm with 11 beat run of V-Tach.  Trigeminal PAC's.  Pt states he was asleep during event and reports no known symptoms.  Alert taken to Dr Burt Knack for review.  Per Dr Burt Knack he recommends pt continue to monitor, no driving and follow up with Dr Harrington Challenger.   Pt notified of recommendations who verbalizes understanding and agrees with current plan.

## 2021-02-20 ENCOUNTER — Ambulatory Visit (INDEPENDENT_AMBULATORY_CARE_PROVIDER_SITE_OTHER): Payer: PPO

## 2021-02-20 ENCOUNTER — Encounter: Payer: Self-pay | Admitting: *Deleted

## 2021-02-20 ENCOUNTER — Telehealth: Payer: Self-pay | Admitting: Medical

## 2021-02-20 DIAGNOSIS — R55 Syncope and collapse: Secondary | ICD-10-CM

## 2021-02-20 LAB — ECHOCARDIOGRAM COMPLETE
AV Vena cont: 0.3 cm
Area-P 1/2: 2.17 cm2
P 1/2 time: 394 msec
S' Lateral: 2.7 cm

## 2021-02-20 NOTE — Telephone Encounter (Signed)
     Notified by [employer] that patient had a 7 second run of VT at a rate of 155bpm with underlying rhythm sinus at 84 bpm. 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 Appears to have prior runs during this monitoring period. Advised to avoid driving until monitor complete and patient follows up with Dr. Harrington Challenger.   Abigail Butts, PA-C 02/20/21; 6:46 AM

## 2021-02-24 ENCOUNTER — Ambulatory Visit: Payer: PPO | Admitting: Diagnostic Neuroimaging

## 2021-02-24 ENCOUNTER — Encounter: Payer: Self-pay | Admitting: Diagnostic Neuroimaging

## 2021-02-24 VITALS — BP 147/85 | HR 99 | Ht 73.0 in | Wt 219.0 lb

## 2021-02-24 DIAGNOSIS — R55 Syncope and collapse: Secondary | ICD-10-CM | POA: Diagnosis not present

## 2021-02-24 DIAGNOSIS — E1165 Type 2 diabetes mellitus with hyperglycemia: Secondary | ICD-10-CM | POA: Diagnosis not present

## 2021-02-24 DIAGNOSIS — E7849 Other hyperlipidemia: Secondary | ICD-10-CM | POA: Diagnosis not present

## 2021-02-24 DIAGNOSIS — R269 Unspecified abnormalities of gait and mobility: Secondary | ICD-10-CM | POA: Diagnosis not present

## 2021-02-24 DIAGNOSIS — I10 Essential (primary) hypertension: Secondary | ICD-10-CM | POA: Diagnosis not present

## 2021-02-24 NOTE — Patient Instructions (Signed)
SYNCOPE (01/16/21, 02/03/21; likely dehydration) - monitor BP to check for orthostatic hypotension - follow up with cardiology and PCP  Mild parkinsonism (not that evident today on exam; could be more related to prior cervical and lumbar spine disease) - tried and failed carb/levo; stopped due to lack of effiectiveness - continue PT exercises - fall precautions reviewed  GAIT DIFFICULTY - related to cervical and lumbar spine disease

## 2021-02-24 NOTE — Progress Notes (Signed)
[facility]  PATIENT: Zachary [name] DOB: 1946-06-30  REFERRING CLINICIAN: Merlyn Albert, MD HISTORY FROM: patient and wife REASON FOR VISIT: follow up   HISTORICAL  CHIEF COMPLAINT:  Chief Complaint  Patient presents with  . Syncopal event    Rm 7 wife - Mardene Celeste, FU due to syncopal event    HISTORY OF PRESENT ILLNESS:   UPDATE (02/24/21, VRP): Since last visit, had syncope events on 01/16/21, and then 2nd event on 02/03/21; went to er for eval and found to be slightly hypotensive. IVF and d/c home. Now drinking more water and doing better. No alleviating or aggravating factors.   UPDATE (10/15/20, VRP): Since last visit, doing well. Symptoms are stable. On taking carb/levo 1 tab daily. Gait, memory issues stable per patient. Now has hearing aids. Some tinnitus issues since that time.  UPDATE (10/10/19, VRP): Since last visit, doing well. Symptoms are stable. Severity is mild. No alleviating or aggravating factors. Tolerating meds. Hearing is slightly off, and sometimes he has arguments with spouse about recall of events.  UPDATE (09/27/18, VRP): Since last visit, doing well. Symptoms are stable. Severity is mild. No alleviating or aggravating factors. Tolerating meds.  2 falls since last visit.   UPDATE (12/22/17, VRP): Since last visit, doing about the same. Wife is under high stress. She feels that patient has lost motivation. Patient feels fair. Tolerating meds. No alleviating or aggravating factors.   UPDATE 09/21/17: Since last visit, stable, but out of meds for anxiety. Needs a new psychiatry clinic.   UPDATE 04/01/17: Since last visit has had MRI brain and c-spine, and had PT evaluation. Memory loss and gait issues are stable. Has life stress with concern about his mother's health. He is looking forward to football pre-season starting.   PRIOR HPI (01/15/17): 75 year old male here for evaluation of memory problems and gait difficulty. Patient reports memory  problems for past 1 year. Wife thinks he has been having memory problem for at least 3 years. He is forgetting dates, fax, having trouble with comprehension, sleeping more. Also patient having more balance and walking problems. She has noticed that patient is taking small shuffling steps. 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 Following these 2 surgeries he had extensive physical therapy and rehabilitation. Patient attributes his balance problems to his low back surgery problem. Patient also has history of bladder cancer, hypertension, ringing in ears, depression, anxiety. Patient has had deterioration handwriting, irregular sleep, soft and hoarse voice. Patient having some intermittent tremor in his hands.   REVIEW OF SYSTEMS: Full 14 system review of systems performed and negative with exception of: as per HPI.    ALLERGIES: Allergies  Allergen Reactions  . Sulfonamide Derivatives Itching, Rash and Other (See Comments)    HOME MEDICATIONS: Outpatient Medications Prior to Visit  Medication Sig Dispense Refill  . acetaminophen (TYLENOL) 500 MG tablet Take 1,000 mg by mouth at bedtime as needed (for pain.).     Marland Kitchen aspirin EC 81 MG tablet Take 81 mg by mouth daily. Swallow whole.    . DULoxetine (CYMBALTA) 60 MG capsule TAKE 1 CAPSULE(60 MG) BY MOUTH DAILY (Patient taking differently: Take 60 mg by mouth daily.) 90 capsule 3  . ibuprofen (ADVIL) 200 MG tablet Take 200 mg by mouth every 6 (six) hours as needed for fever or mild pain.    . pravastatin (PRAVACHOL) 10 MG tablet Take 10  mg by mouth at bedtime.    . valsartan-hydrochlorothiazide (DIOVAN-HCT) 320-25 MG tablet Take 1 tablet by mouth daily.     . Probiotic Product (PROBIOTIC PO) Take 1 tablet by mouth daily. (Patient not taking: Reported on 02/24/2021)    . tamsulosin (FLOMAX) 0.4 MG CAPS capsule Take 1 capsule (0.4 mg total) by mouth  daily after breakfast. (Patient not taking: No sig reported) 10 capsule 0   No facility-administered medications prior to visit.    PAST MEDICAL HISTORY: Past Medical History:  Diagnosis Date  . Anxiety    takes Valium as needed  . Arthritis   . Bladder cancer ([place])    takes Rapaflo daily  . Blood dyscrasia    07/08/16: pt told he was a "free bleeder" after bleeding a lot when derm cut off mole on forehead. No excessive bleeding with minor wounds at home, no bleeding problems perioperatively with prior surgeries.  . Chronic back pain    spondylolisthesis  . Cluster headaches   . Depression    takes Lexapro daily  . Essential hypertension, benign    takes Diovan-HCT daily  . Hearing loss    hearing aids  . Hx of complications due to general anesthesia    Aborted surgery 07/15/2016 due to hypotension per pt  . Joint pain   . Kidney stone 03/2019   passed stone  . Obstructive sleep apnea on CPAP    uses CPAP @ night  . Parkinsonism ([place])   . Pneumonia    "several times"--last time about 3-4 yrs ago  . Prediabetes   . Syncopal episodes   . Thyroid condition     PAST SURGICAL HISTORY: Past Surgical History:  Procedure Laterality Date  . ANTERIOR CERVICAL DECOMP/DISCECTOMY FUSION N/A 06/05/2013   Procedure:  C3-4 Anterior Cervical Discectomy and Fusion, Allograft, Plate;  Surgeon: [name], MD;  Location: [facility];  Service: Orthopedics;  Laterality: N/A;  C3-4 Anterior Cervical Discectomy and Fusion, Allograft, Plate  . Arthroscopic knee surgery    . BACK SURGERY      x 2  . BLADDER SURGERY     x 5 to remove tumor  . CARPAL TUNNEL RELEASE    . CATARACT EXTRACTION W/PHACO  12/19/2012   Procedure: CATARACT EXTRACTION PHACO AND INTRAOCULAR LENS PLACEMENT (IOC);  Surgeon: [name], MD;  Location: [facility];  Service: Ophthalmology;  Laterality: Left;  CDE: 26.80  . CATARACT EXTRACTION W/PHACO  01/09/2013   Procedure: CATARACT EXTRACTION PHACO AND INTRAOCULAR LENS PLACEMENT  (IOC);  Surgeon: [name], MD;  Location: [facility];  Service: Ophthalmology;  Laterality: Right;  CDE:22.83  . CERVICAL FUSION  06/05/2013   C 3  C4  . CYSTOSCOPY    . CYSTOSCOPY W/ URETERAL STENT PLACEMENT Bilateral 02/12/2020   Procedure: CYSTOSCOPY WITH RETROGRADE PYELOGRAM/URETERAL STENT PLACEMENT;  Surgeon: Irine Seal, MD;  Location: [facility];  Service: Urology;  Laterality: Bilateral;  . LITHOTRIPSY  02/2020  . REFRACTIVE SURGERY     Hx: of  . RETINAL DETACHMENT SURGERY    . TOTAL KNEE ARTHROPLASTY Left 02/01/2018   Procedure: TOTAL KNEE ARTHROPLASTY;  Surgeon: Carole Civil, MD;  Location: [facility];  Service: Orthopedics;  Laterality: Left;  . TRANSURETHRAL RESECTION OF BLADDER TUMOR Right 07/27/2015   Procedure: TRANSURETHRAL RESECTION OF BLADDER TUMOR (TURBT);  Surgeon: [name], MD;  Location: [facility];  Service: Urology;  Laterality: Right;  . wisdom tooth extraction      FAMILY HISTORY: Family History  Problem Relation  Age of Onset  . Hypertension Father   . Cancer Father        Prostate  . Depression Mother   . Hypertension Mother   . Alcohol abuse Mother   . COPD Mother        passed 07-08-17  . Hypertension Sister   . Anxiety disorder Sister   . Alcohol abuse Maternal Grandfather   . ADD / ADHD Neg Hx   . Bipolar disorder Neg Hx   . Dementia Neg Hx   . Drug abuse Neg Hx   . OCD Neg Hx   . Paranoid behavior Neg Hx   . Schizophrenia Neg Hx   . Seizures Neg Hx   . Sexual abuse Neg Hx   . Physical abuse Neg Hx     SOCIAL HISTORY:  Social History   Socioeconomic History  . Marital status: Married    Spouse name: [name]   . Number of children: 0  . Years of education: 70  . Highest education level: Not on file  Occupational History  . Occupation: Retired    Fish farm manager: UNEMPLOYED  Tobacco Use  . Smoking status: Former Smoker    Quit date: 12/30/1976    Years since quitting: 44.1  . Smokeless tobacco: Never Used  . Tobacco comment: 1985  Vaping Use   . Vaping Use: Never used  Substance and Sexual Activity  . Alcohol use: No    Comment: quit 1990  . Drug use: No  . Sexual activity: Yes  Other Topics Concern  . Not on file  Social History Narrative   Lives with wife    caffeine- sodas   Social Determinants of Health   Financial Resource Strain: Not on file  Food Insecurity: Not on file  Transportation Needs: Not on file  Physical Activity: Not on file  Stress: Not on file  Social Connections: Not on file  Intimate Partner Violence: Not on file     PHYSICAL EXAM  GENERAL EXAM/CONSTITUTIONAL: Vitals:  Vitals:   02/24/21 1404  BP: (!) 147/85  Pulse: 99  Weight: 219 lb (99.3 kg)  Height: 6\' 1"  (1.854 m)   Orthostatic VS for the past 24 hrs (Last 3 readings):  BP- Lying Pulse- Lying BP- Sitting Pulse- Sitting BP- Standing at 0 minutes Pulse- Standing at 0 minutes  02/24/21 1417 159/83 101 145/84 82 143/78 103    Body mass index is 28.89 kg/m. No exam data present  Patient is in no distress; well developed, nourished and groomed; neck is supple  FLAT AFFECT, MASKED FACIES  CARDIOVASCULAR:  Examination of carotid arteries is normal; no carotid bruits  Regular rate and rhythm, no murmurs  Examination of peripheral vascular system by observation and palpation is normal  EYES:  Ophthalmoscopic exam of optic discs and posterior segments is normal; no papilledema or hemorrhages  MUSCULOSKELETAL:  Gait, strength, tone, movements noted in Neurologic exam below  NEUROLOGIC: MENTAL STATUS:  MMSE - Mini Mental State Exam 10/15/2020 10/10/2019 09/27/2018  Orientation to time 5 5 5   Orientation to Place 4 4 5   Registration 3 3 3   Attention/ Calculation 4 5 5   Recall 3 3 3   Language- name 2 objects 2 2 2   Language- repeat 0 0 1  Language- follow 3 step command 3 3 2   Language- follow 3 step command-comments - - handed paper back to RN  Language- read & follow direction 1 1 1   Write a sentence 1 1 1   Copy  design 1  1 1  Total score 27 28 29     awake, alert, oriented to person, place and time  recent and remote memory intact  normal attention and concentration  language fluent, comprehension intact, naming intact,   fund of knowledge appropriate  CRANIAL NERVE:   2nd - no papilledema on fundoscopic exam  2nd, 3rd, 4th, 6th - pupils equal and reactive to light, visual fields full to confrontation, extraocular muscles intact, no nystagmus  5th - facial sensation symmetric  7th - facial strength symmetric  8th - hearing intact  9th - palate elevates symmetrically, uvula midline  11th - shoulder shrug symmetric  12th - tongue protrusion midline  SOFT HOARSE VOICE  MOTOR:   normal bulk; full strength in the BUE, BLE  NO BRADYKINESIA; NO RIGIDITY  NO RESTING TREMOR   SENSORY:   normal and symmetric to light touch, temperature, vibration; EXCEPT DECR IN FEET  COORDINATION:   finger-nose-finger, fine finger movements SLOW  REFLEXES:   deep tendon reflexes TRACE and symmetric  GAIT/STATION:   narrow based gait; DECR ARM SWING; STOOPED POSTURE     DIAGNOSTIC DATA (LABS, IMAGING, TESTING) - I reviewed patient records, labs, notes, testing and imaging myself where available.  Lab Results  Component Value Date   WBC 6.9 02/03/2021   HGB 12.2 (L) 02/03/2021   HCT 38.5 (L) 02/03/2021   MCV 92.1 02/03/2021   PLT 195 02/03/2021      Component Value Date/Time   NA 142 02/03/2021 1131   K 3.9 02/03/2021 1131   CL 108 02/03/2021 1131   CO2 26 02/03/2021 1131   GLUCOSE 77 02/03/2021 1131   BUN 35 (H) 02/03/2021 1131   CREATININE 1.62 (H) 02/03/2021 1131   CALCIUM 8.6 (L) 02/03/2021 1131   PROT 6.7 02/03/2021 1131   ALBUMIN 3.5 02/03/2021 1131   AST 16 02/03/2021 1131   ALT 22 02/03/2021 1131   ALKPHOS 69 02/03/2021 1131   BILITOT 0.8 02/03/2021 1131   GFRNONAA 44 (L) 02/03/2021 1131   GFRAA 54 (L) 02/13/2020 0429   No results found for: CHOL, HDL,  LDLCALC, LDLDIRECT, TRIG, CHOLHDL Lab Results  Component Value Date   HGBA1C 5.8 (H) 07/22/2016   No results found for: VITAMINB12 Lab Results  Component Value Date   TSH 2.701 02/03/2021    01/28/12 MRI brain  - Nonspecific slightly atypical white matter type changes as noted above. - No internal auditory canal or skull base abnormality is noted.  01/28/12 MRA head - Intracranial atherosclerotic type changes predominant involving branch vessels as detailed above.  01/28/12 MRV head - Loss of signal of the left transverse sinus probably is related to artifact rather than true stenosis. Overall, the major dural sinuses appear patent.   05/09/13 MRI cervical [I reviewed images myself and agree with interpretation. -VRP]  - Multilevel cervical spondylosis detailed above.  Progressive C3-C4 degenerative disc disease with flattening of the cervical cord. Moderate to severe central stenosis with 7 mm AP diameter of the thecal sac.  No cord edema.  C3-C4 further collapse of the disc space and the extrusion appears slightly larger than on the prior exam.  Other levels appear similar with multilevel mild central stenosis and bilateral foraminal stenosis.  In this patient with left upper extremity radiculopathy, the left-sided foraminal stenosis is most pronounced at C6-C7, potentially affecting the left C7 nerve.  06/12/16 MRI lumbar spine [I reviewed images myself and agree with interpretation. -VRP]  - Severe stenosis at L3-4 is multifactorial, related  to central protrusion, posterior element hypertrophy and short pedicles affecting both L4 nerve roots. - 2 mm slip L4-5 associated with a central and leftward protrusion also with posterior element hypertrophy and short pedicles. Moderate to severe stenosis along with LEFT greater than RIGHT L5 nerve root impingement.  - No recurrent leftward protrusion at the L5-S1 level, however a central and rightward protrusion at L5-S1 could affect the RIGHT S1  nerve root.  12/23/16 CT head [I reviewed images myself and agree with interpretation. -VRP]  - Mild chronic ischemic white matter disease. No acute intracranial abnormality seen.  12/23/16 CT cervical [I reviewed images myself and agree with interpretation. -VRP]  - Postsurgical and degenerative changes as described above. No acute abnormality seen in the cervical spine.  01/23/17 MRI of the brain without contrast shows the following: 1.     Moderate cortical atrophy, more pronounced in the mesial temporal lobes, which has significantly progressed when compared to the 01/28/2012 MRI. 2.    Mild to moderate chronic microvascular ischemic changes, mildly progressed when compared to the previous MRI. 3.    There are no acute findings.  01/23/17 MRI of the cervical spine without contrast shows the following: 1.    Since the MRI of the cervical spine dated 05/09/2013, the patient has undergone C3-C4 ACDF with resolution of the spinal stenosis noted at that time. 2.    There are multilevel degenerative changes resulting in mild spinal stenosis at C4-C5. Our various degrees of mild-to-moderate foraminal narrowing as detailed above that did not lead to nerve root compression. 3.    The spinal cord appears normal. 4.    There are no acute findings.     ASSESSMENT AND PLAN  75 y.o. year old male here with progressive memory problems, short-term recall issues, cognitive difficulty, balance and walking problems, tremor. Other contributing factors could include depression, chronic pain, degenerative spine disease, aging and deconditioning.  Ddx memory loss: depression, chronic pain, life stress, neurodegenerative, hearing loss   Ddx balance difficulty: cervical myelopathy sequelae + lumbar spinal stenosis   1. Gait difficulty   2. Syncope, unspecified syncope type       PLAN:  SYNCOPE (01/16/21, 02/03/21; likely dehydration; not related to CNS issues) - monitor BP to check for orthostatic  hypotension - follow up with cardiology and PCP  Prior mild parkinsonism (not that evident today on exam; sxs could be more related to prior cervical and lumbar spine disease) - tried and failed carb/levo; stopped due to lack of effiectiveness - continue PT exercises - fall precautions reviewed  GAIT DIFFICULTY - related to cervical and lumbar spine disease and sequelae; unclear mild parkinsonism in the past  MEMORY LOSS - stable; monitor - continue hearing aids and hearing testing  DEPRESSION / ANXIETY - improved; continue treatments per psychiatry  MARITAL STRAIN - stable  Return for return to PCP.    Penni Bombard, MD 9/45/8592, 9:24 PM Certified in Neurology, Neurophysiology and Neuroimaging  [facility] [address] [place], [place] [address] [phone]

## 2021-03-03 DIAGNOSIS — J0100 Acute maxillary sinusitis, unspecified: Secondary | ICD-10-CM | POA: Diagnosis not present

## 2021-03-03 DIAGNOSIS — H8111 Benign paroxysmal vertigo, right ear: Secondary | ICD-10-CM | POA: Diagnosis not present

## 2021-03-03 DIAGNOSIS — R946 Abnormal results of thyroid function studies: Secondary | ICD-10-CM | POA: Diagnosis not present

## 2021-03-05 DIAGNOSIS — R7301 Impaired fasting glucose: Secondary | ICD-10-CM | POA: Diagnosis not present

## 2021-03-05 DIAGNOSIS — Z2829 Immunization not carried out because of patient decision for other reason: Secondary | ICD-10-CM | POA: Diagnosis not present

## 2021-03-05 DIAGNOSIS — M461 Sacroiliitis, not elsewhere classified: Secondary | ICD-10-CM | POA: Diagnosis not present

## 2021-03-05 DIAGNOSIS — I10 Essential (primary) hypertension: Secondary | ICD-10-CM | POA: Diagnosis not present

## 2021-03-05 DIAGNOSIS — M6530 Trigger finger, unspecified finger: Secondary | ICD-10-CM | POA: Diagnosis not present

## 2021-03-05 DIAGNOSIS — Z712 Person consulting for explanation of examination or test findings: Secondary | ICD-10-CM | POA: Diagnosis not present

## 2021-03-05 DIAGNOSIS — L989 Disorder of the skin and subcutaneous tissue, unspecified: Secondary | ICD-10-CM | POA: Diagnosis not present

## 2021-03-05 DIAGNOSIS — E782 Mixed hyperlipidemia: Secondary | ICD-10-CM | POA: Diagnosis not present

## 2021-03-05 DIAGNOSIS — R946 Abnormal results of thyroid function studies: Secondary | ICD-10-CM | POA: Diagnosis not present

## 2021-03-05 DIAGNOSIS — Z87442 Personal history of urinary calculi: Secondary | ICD-10-CM | POA: Diagnosis not present

## 2021-03-05 DIAGNOSIS — E1169 Type 2 diabetes mellitus with other specified complication: Secondary | ICD-10-CM | POA: Diagnosis not present

## 2021-03-05 DIAGNOSIS — Z96652 Presence of left artificial knee joint: Secondary | ICD-10-CM | POA: Diagnosis not present

## 2021-03-06 ENCOUNTER — Other Ambulatory Visit: Payer: Self-pay | Admitting: *Deleted

## 2021-03-06 ENCOUNTER — Telehealth: Payer: Self-pay | Admitting: Internal Medicine

## 2021-03-06 ENCOUNTER — Telehealth: Payer: Self-pay | Admitting: *Deleted

## 2021-03-06 DIAGNOSIS — I4729 Other ventricular tachycardia: Secondary | ICD-10-CM

## 2021-03-06 DIAGNOSIS — I472 Ventricular tachycardia: Secondary | ICD-10-CM

## 2021-03-06 NOTE — Telephone Encounter (Signed)
-----   Message from Fay Records, MD sent at 02/27/2021 11:19 PM EST ----- Monitor showed NSR .   There were a few short bursts of VT (7 seconds longest)   Pt did not sense I have reviewed with EP  Would get lexiscan myouve ([address]) Set up to see [employer]  week he gets back

## 2021-03-06 NOTE — Telephone Encounter (Signed)
Patient would like to speak with the nurse regarding the recommendation of having a lexiscan due to results of his heart monitor. He has some concerns after thinking more about it.

## 2021-03-06 NOTE — Telephone Encounter (Signed)
Signed.

## 2021-03-06 NOTE — Telephone Encounter (Signed)
Reviewed results and plan with patient.  He is aware will receive a call to schedule both appointments.

## 2021-03-06 NOTE — Telephone Encounter (Signed)
Called the patient back. Explained again the monitor results.  He wonders there was something off w the monitor that it could have misread his heartbeat because everything else had checked out so far and they determined he passed out due to dehydration..  Reviewed the non sustained VT and explained the reason for the lexiscan and then the reason to see Dr. Lovena Le.  His concerns have been addressed.  He is willing to schedule these appointments now.  Sent another message to scheduling to arrange.

## 2021-03-12 ENCOUNTER — Telehealth (HOSPITAL_COMMUNITY): Payer: Self-pay | Admitting: *Deleted

## 2021-03-12 DIAGNOSIS — R000 Tachycardia, unspecified: Secondary | ICD-10-CM | POA: Diagnosis not present

## 2021-03-12 DIAGNOSIS — F339 Major depressive disorder, recurrent, unspecified: Secondary | ICD-10-CM | POA: Diagnosis not present

## 2021-03-12 DIAGNOSIS — R946 Abnormal results of thyroid function studies: Secondary | ICD-10-CM | POA: Diagnosis not present

## 2021-03-12 DIAGNOSIS — F419 Anxiety disorder, unspecified: Secondary | ICD-10-CM | POA: Diagnosis not present

## 2021-03-12 DIAGNOSIS — G20 Parkinson's disease: Secondary | ICD-10-CM | POA: Diagnosis not present

## 2021-03-12 DIAGNOSIS — E782 Mixed hyperlipidemia: Secondary | ICD-10-CM | POA: Diagnosis not present

## 2021-03-12 DIAGNOSIS — H8110 Benign paroxysmal vertigo, unspecified ear: Secondary | ICD-10-CM | POA: Diagnosis not present

## 2021-03-12 DIAGNOSIS — E1169 Type 2 diabetes mellitus with other specified complication: Secondary | ICD-10-CM | POA: Diagnosis not present

## 2021-03-12 DIAGNOSIS — I10 Essential (primary) hypertension: Secondary | ICD-10-CM | POA: Diagnosis not present

## 2021-03-12 DIAGNOSIS — Z8551 Personal history of malignant neoplasm of bladder: Secondary | ICD-10-CM | POA: Diagnosis not present

## 2021-03-12 DIAGNOSIS — Z87442 Personal history of urinary calculi: Secondary | ICD-10-CM | POA: Diagnosis not present

## 2021-03-12 DIAGNOSIS — R944 Abnormal results of kidney function studies: Secondary | ICD-10-CM | POA: Diagnosis not present

## 2021-03-12 NOTE — Telephone Encounter (Signed)
Left message on voicemail per DPR in reference to upcoming appointment scheduled on 03/14/21 at 7:30 with detailed instructions given per Myocardial Perfusion Study Information Sheet for the test. LM to arrive 15 minutes early, and that it is imperative to arrive on time for appointment to keep from having the test rescheduled. If you need to cancel or reschedule your appointment, please call the office within 24 hours of your appointment. Failure to do so may result in a cancellation of your appointment, and a $50 no show fee. Phone number given for call back for any questions.

## 2021-03-14 ENCOUNTER — Ambulatory Visit (HOSPITAL_COMMUNITY): Payer: PPO | Attending: Cardiology

## 2021-03-14 ENCOUNTER — Other Ambulatory Visit: Payer: Self-pay

## 2021-03-14 DIAGNOSIS — I472 Ventricular tachycardia: Secondary | ICD-10-CM | POA: Insufficient documentation

## 2021-03-14 DIAGNOSIS — I10 Essential (primary) hypertension: Secondary | ICD-10-CM | POA: Insufficient documentation

## 2021-03-14 DIAGNOSIS — I4729 Other ventricular tachycardia: Secondary | ICD-10-CM

## 2021-03-14 DIAGNOSIS — I4510 Unspecified right bundle-branch block: Secondary | ICD-10-CM | POA: Diagnosis not present

## 2021-03-14 LAB — MYOCARDIAL PERFUSION IMAGING
LV dias vol: 77 mL (ref 62–150)
LV sys vol: 25 mL
Peak HR: 104 {beats}/min
Rest HR: 74 {beats}/min
SDS: 0
SRS: 0
SSS: 0
TID: 0.84

## 2021-03-14 MED ORDER — REGADENOSON 0.4 MG/5ML IV SOLN
0.4000 mg | Freq: Once | INTRAVENOUS | Status: AC
  Administered 2021-03-14: 0.4 mg via INTRAVENOUS

## 2021-03-14 MED ORDER — TECHNETIUM TC 99M TETROFOSMIN IV KIT
30.9000 | PACK | Freq: Once | INTRAVENOUS | Status: AC | PRN
  Administered 2021-03-14: 30.9 via INTRAVENOUS
  Filled 2021-03-14: qty 31

## 2021-03-14 MED ORDER — TECHNETIUM TC 99M TETROFOSMIN IV KIT
10.1000 | PACK | Freq: Once | INTRAVENOUS | Status: AC | PRN
  Administered 2021-03-14: 10.1 via INTRAVENOUS
  Filled 2021-03-14: qty 11

## 2021-03-17 ENCOUNTER — Ambulatory Visit: Payer: PPO | Admitting: Orthopedic Surgery

## 2021-03-26 DIAGNOSIS — I10 Essential (primary) hypertension: Secondary | ICD-10-CM | POA: Diagnosis not present

## 2021-03-26 DIAGNOSIS — E1165 Type 2 diabetes mellitus with hyperglycemia: Secondary | ICD-10-CM | POA: Diagnosis not present

## 2021-03-26 DIAGNOSIS — G4733 Obstructive sleep apnea (adult) (pediatric): Secondary | ICD-10-CM | POA: Diagnosis not present

## 2021-04-02 ENCOUNTER — Institutional Professional Consult (permissible substitution): Payer: PPO | Admitting: Internal Medicine

## 2021-04-05 ENCOUNTER — Encounter: Payer: Self-pay | Admitting: Emergency Medicine

## 2021-04-05 ENCOUNTER — Ambulatory Visit
Admission: EM | Admit: 2021-04-05 | Discharge: 2021-04-05 | Disposition: A | Discharge status: Home / Self Care | Payer: PPO | Attending: Emergency Medicine | Admitting: Emergency Medicine

## 2021-04-05 ENCOUNTER — Other Ambulatory Visit: Payer: Self-pay

## 2021-04-05 DIAGNOSIS — H8303 Labyrinthitis, bilateral: Secondary | ICD-10-CM | POA: Diagnosis not present

## 2021-04-05 MED ORDER — DEXAMETHASONE SODIUM PHOSPHATE 10 MG/ML IJ SOLN
10.0000 mg | Freq: Once | INTRAMUSCULAR | Status: AC
  Administered 2021-04-05: 10 mg via INTRAMUSCULAR

## 2021-04-05 MED ORDER — PREDNISONE 10 MG PO TABS: 20.0000 mg | ORAL_TABLET | Freq: Every day | ORAL | 0 refills | Status: AC

## 2021-04-05 NOTE — Discharge Instructions (Addendum)
Continue to take meclizine as prescribed and directed by PCP Decadron IM was given in office Prescribed low-dose prednisone Follow-up with PCP Return or go to ED if you develop any new or worsening of his symptom

## 2021-04-05 NOTE — ED Triage Notes (Addendum)
Pt having vertigo x 3 weeks.  Prescribed meclizine by pcp with no relief.

## 2021-04-05 NOTE — ED Provider Notes (Signed)
[facility]   [id number] 04/05/21 Arrival Time: 1017  [id number]  SUBJECTIVE:  Zachary [name] is a 75 y.o. male who presented to the urgent care with a complaint of vertigo for the past 3 weeks.  States he developed symptom after having moderna COVID vaccine.  Describes the dizziness as "the room spinning." States that it is constant with episodes lasting few seconds to  minutes.  Has tried meclizine prescribed by PCP without relief.  Symptoms are made worse when going to bed or  getting out of bed.  Admit to previous symptoms that improved with meclizine.  Denies fever, chills, nausea, vomiting, hearing changes, tinnitus, ear pain, chest pain, syncope, SOB, weakness, slurred speech, memory or emotional changes, facial drooping/ asymmetry, incoordination, numbness or tingling, abdominal pain, changes in bowel or bladder habits.     ROS: As per HPI.  All other pertinent ROS negative.    Past Medical History:  Diagnosis Date  . Anxiety    takes Valium as needed  . Arthritis   . Bladder cancer ([place])    takes Rapaflo daily  . Blood dyscrasia    07/08/16: pt told he was a "free bleeder" after bleeding a lot when derm cut off mole on forehead. No excessive bleeding with minor wounds at home, no bleeding problems perioperatively with prior surgeries.  . Chronic back pain    spondylolisthesis  . Cluster headaches   . Depression    takes Lexapro daily  . Essential hypertension, benign    takes Diovan-HCT daily  . Hearing loss    hearing aids  . Hx of complications due to general anesthesia    Aborted surgery 07/15/2016 due to hypotension per pt  . Joint pain   . Kidney stone 03/2019   passed stone  . Obstructive sleep apnea on CPAP    uses CPAP @ night  . Parkinsonism ([place])   . Pneumonia    "several times"--last time about 3-4 yrs ago  . Prediabetes   . Syncopal episodes   . Thyroid condition    Past Surgical History:  Procedure Laterality Date  . ANTERIOR CERVICAL  DECOMP/DISCECTOMY FUSION N/A 06/05/2013   Procedure:  C3-4 Anterior Cervical Discectomy and Fusion, Allograft, Plate;  Surgeon: [name], MD;  Location: [facility];  Service: Orthopedics;  Laterality: N/A;  C3-4 Anterior Cervical Discectomy and Fusion, Allograft, Plate  . Arthroscopic knee surgery    . BACK SURGERY      x 2  . BLADDER SURGERY     x 5 to remove tumor  . CARPAL TUNNEL RELEASE    . CATARACT EXTRACTION W/PHACO  12/19/2012   Procedure: CATARACT EXTRACTION PHACO AND INTRAOCULAR LENS PLACEMENT (IOC);  Surgeon: [name], MD;  Location: [facility];  Service: Ophthalmology;  Laterality: Left;  CDE: 26.80  . CATARACT EXTRACTION W/PHACO  01/09/2013   Procedure: CATARACT EXTRACTION PHACO AND INTRAOCULAR LENS PLACEMENT (IOC);  Surgeon: [name], MD;  Location: [facility];  Service: Ophthalmology;  Laterality: Right;  CDE:22.83  . CERVICAL FUSION  06/05/2013   C 3  C4  . CYSTOSCOPY    . CYSTOSCOPY W/ URETERAL STENT PLACEMENT Bilateral 02/12/2020   Procedure: CYSTOSCOPY WITH RETROGRADE PYELOGRAM/URETERAL STENT PLACEMENT;  Surgeon: Irine Seal, MD;  Location: [facility];  Service: Urology;  Laterality: Bilateral;  . LITHOTRIPSY  02/2020  . REFRACTIVE SURGERY     Hx: of  . RETINAL DETACHMENT SURGERY    . TOTAL KNEE ARTHROPLASTY Left 02/01/2018   Procedure: TOTAL KNEE  ARTHROPLASTY;  Surgeon: Carole Civil, MD;  Location: [facility];  Service: Orthopedics;  Laterality: Left;  . TRANSURETHRAL RESECTION OF BLADDER TUMOR Right 07/27/2015   Procedure: TRANSURETHRAL RESECTION OF BLADDER TUMOR (TURBT);  Surgeon: [name], MD;  Location: [facility];  Service: Urology;  Laterality: Right;  . wisdom tooth extraction     Allergies  Allergen Reactions  . Sulfonamide Derivatives Itching, Rash and Other (See Comments)   No current facility-administered medications on file prior to encounter.   Current Outpatient Medications on File Prior to Encounter  Medication Sig Dispense Refill  . acetaminophen  (TYLENOL) 500 MG tablet Take 1,000 mg by mouth at bedtime as needed (for pain.).     Marland Kitchen aspirin EC 81 MG tablet Take 81 mg by mouth daily. Swallow whole.    . DULoxetine (CYMBALTA) 60 MG capsule TAKE 1 CAPSULE(60 MG) BY MOUTH DAILY (Patient taking differently: Take 60 mg by mouth daily.) 90 capsule 3  . ibuprofen (ADVIL) 200 MG tablet Take 200 mg by mouth every 6 (six) hours as needed for fever or mild pain.    . pravastatin (PRAVACHOL) 10 MG tablet Take 10 mg by mouth at bedtime.    . Probiotic Product (PROBIOTIC PO) Take 1 tablet by mouth daily. (Patient not taking: Reported on 02/24/2021)    . tamsulosin (FLOMAX) 0.4 MG CAPS capsule Take 1 capsule (0.4 mg total) by mouth daily after breakfast. (Patient not taking: No sig reported) 10 capsule 0  . valsartan-hydrochlorothiazide (DIOVAN-HCT) 320-25 MG tablet Take 1 tablet by mouth daily.      Social History   Socioeconomic History  . Marital status: Married    Spouse name: [name]   . Number of children: 0  . Years of education: 21  . Highest education level: Not on file  Occupational History  . Occupation: Retired    Fish farm manager: UNEMPLOYED  Tobacco Use  . Smoking status: Former Smoker    Quit date: 12/30/1976    Years since quitting: 44.2  . Smokeless tobacco: Never Used  . Tobacco comment: 1985  Vaping Use  . Vaping Use: Never used  Substance and Sexual Activity  . Alcohol use: No    Comment: quit 1990  . Drug use: No  . Sexual activity: Yes  Other Topics Concern  . Not on file  Social History Narrative   Lives with wife    caffeine- sodas   Social Determinants of Health   Financial Resource Strain: Not on file  Food Insecurity: Not on file  Transportation Needs: Not on file  Physical Activity: Not on file  Stress: Not on file  Social Connections: Not on file  Intimate Partner Violence: Not on file   Family History  Problem Relation Age of Onset  . Hypertension Father   . Cancer Father        Prostate  . Depression  Mother   . Hypertension Mother   . Alcohol abuse Mother   . COPD Mother        passed 07-08-17  . Hypertension Sister   . Anxiety disorder Sister   . Alcohol abuse Maternal Grandfather   . ADD / ADHD Neg Hx   . Bipolar disorder Neg Hx   . Dementia Neg Hx   . Drug abuse Neg Hx   . OCD Neg Hx   . Paranoid behavior Neg Hx   . Schizophrenia Neg Hx   . Seizures Neg Hx   . Sexual abuse Neg Hx   . Physical  abuse Neg Hx     OBJECTIVE:  Vitals:   04/05/21 1028  BP: (!) 159/76  Pulse: 85  Resp: 19  Temp: 98.2 F (36.8 C)  TempSrc: Oral  SpO2: 95%    Physical Exam Vitals and nursing note reviewed.  Constitutional:      General: He is not in acute distress.    Appearance: Normal appearance. He is normal weight. He is not ill-appearing, toxic-appearing or diaphoretic.  HENT:     Right Ear: Ear canal and external ear normal. A middle ear effusion is present. There is no impacted cerumen.     Left Ear: Ear canal and external ear normal. A middle ear effusion is present. There is no impacted cerumen.  Cardiovascular:     Rate and Rhythm: Normal rate and regular rhythm.     Pulses: Normal pulses.     Heart sounds: Normal heart sounds. No murmur heard. No friction rub. No gallop.   Pulmonary:     Effort: Pulmonary effort is normal. No respiratory distress.     Breath sounds: Normal breath sounds. No stridor. No wheezing, rhonchi or rales.  Chest:     Chest wall: No tenderness.  Neurological:     General: No focal deficit present.     Mental Status: He is alert and oriented to person, place, and time.     Cranial Nerves: No cranial nerve deficit.     Sensory: No sensory deficit.     Motor: No weakness.     Coordination: Coordination normal.     Gait: Gait normal.     Deep Tendon Reflexes: Reflexes normal.     Labs:  No results found for this or any previous visit (from the past 24 hour(s)). Orders placed or performed during the hospital encounter of 02/03/21  . EKG  12-Lead  . EKG 12-Lead    No results found.  ASSESSMENT & PLAN:  1. Labyrinthitis of both ears     Meds ordered this encounter  Medications  . dexamethasone (DECADRON) injection 10 mg  . predniSONE (DELTASONE) 10 MG tablet    Sig: Take 2 tablets (20 mg total) by mouth daily for 7 days.    Dispense:  14 tablet    Refill:  0   Discharge instructions  Continue to take meclizine as prescribed and directed by PCP Decadron IM was given in office Prescribed low-dose prednisone Follow-up with PCP Return or go to ED if you develop any new or worsening of his symptom  Reviewed expectations re: course of current medical issues. Questions answered. Outlined signs and symptoms indicating need for more acute intervention. Patient verbalized understanding. After Visit Summary given.    Emerson Monte, FNP 04/05/21 1110

## 2021-04-14 DIAGNOSIS — H8110 Benign paroxysmal vertigo, unspecified ear: Secondary | ICD-10-CM | POA: Diagnosis not present

## 2021-04-14 DIAGNOSIS — I10 Essential (primary) hypertension: Secondary | ICD-10-CM | POA: Diagnosis not present

## 2021-04-14 DIAGNOSIS — G20 Parkinson's disease: Secondary | ICD-10-CM | POA: Diagnosis not present

## 2021-04-14 DIAGNOSIS — N8110 Cystocele, unspecified: Secondary | ICD-10-CM | POA: Diagnosis not present

## 2021-04-14 DIAGNOSIS — E1169 Type 2 diabetes mellitus with other specified complication: Secondary | ICD-10-CM | POA: Diagnosis not present

## 2021-04-14 DIAGNOSIS — M5033 Other cervical disc degeneration, cervicothoracic region: Secondary | ICD-10-CM | POA: Diagnosis not present

## 2021-04-14 DIAGNOSIS — E782 Mixed hyperlipidemia: Secondary | ICD-10-CM | POA: Diagnosis not present

## 2021-04-21 DIAGNOSIS — J0100 Acute maxillary sinusitis, unspecified: Secondary | ICD-10-CM | POA: Diagnosis not present

## 2021-04-21 DIAGNOSIS — H8110 Benign paroxysmal vertigo, unspecified ear: Secondary | ICD-10-CM | POA: Diagnosis not present

## 2021-04-21 DIAGNOSIS — I10 Essential (primary) hypertension: Secondary | ICD-10-CM | POA: Diagnosis not present

## 2021-04-21 DIAGNOSIS — M5033 Other cervical disc degeneration, cervicothoracic region: Secondary | ICD-10-CM | POA: Diagnosis not present

## 2021-04-21 DIAGNOSIS — G20 Parkinson's disease: Secondary | ICD-10-CM | POA: Diagnosis not present

## 2021-04-21 DIAGNOSIS — E1169 Type 2 diabetes mellitus with other specified complication: Secondary | ICD-10-CM | POA: Diagnosis not present

## 2021-04-21 DIAGNOSIS — E782 Mixed hyperlipidemia: Secondary | ICD-10-CM | POA: Diagnosis not present

## 2021-04-24 ENCOUNTER — Other Ambulatory Visit: Payer: Self-pay | Admitting: Internal Medicine

## 2021-04-27 DIAGNOSIS — E1165 Type 2 diabetes mellitus with hyperglycemia: Secondary | ICD-10-CM | POA: Diagnosis not present

## 2021-04-27 DIAGNOSIS — I10 Essential (primary) hypertension: Secondary | ICD-10-CM | POA: Diagnosis not present

## 2021-04-28 ENCOUNTER — Other Ambulatory Visit: Payer: Self-pay | Admitting: Internal Medicine

## 2021-04-28 DIAGNOSIS — M5030 Other cervical disc degeneration, unspecified cervical region: Secondary | ICD-10-CM

## 2021-04-28 DIAGNOSIS — R42 Dizziness and giddiness: Secondary | ICD-10-CM

## 2021-05-03 ENCOUNTER — Ambulatory Visit
Admission: RE | Admit: 2021-05-03 | Discharge: 2021-05-03 | Disposition: A | Discharge status: Home / Self Care | Payer: PPO | Source: Ambulatory Visit | Attending: Internal Medicine | Admitting: Internal Medicine

## 2021-05-03 ENCOUNTER — Other Ambulatory Visit: Payer: Self-pay

## 2021-05-03 DIAGNOSIS — M5030 Other cervical disc degeneration, unspecified cervical region: Secondary | ICD-10-CM

## 2021-05-03 DIAGNOSIS — R42 Dizziness and giddiness: Secondary | ICD-10-CM

## 2021-05-03 DIAGNOSIS — M4322 Fusion of spine, cervical region: Secondary | ICD-10-CM | POA: Diagnosis not present

## 2021-05-03 MED ORDER — GADOBENATE DIMEGLUMINE 529 MG/ML IV SOLN
20.0000 mL | Freq: Once | INTRAVENOUS | Status: AC | PRN
  Administered 2021-05-03: 20 mL via INTRAVENOUS

## 2021-05-06 ENCOUNTER — Telehealth (HOSPITAL_COMMUNITY): Payer: PPO | Admitting: Psychiatry

## 2021-05-11 ENCOUNTER — Other Ambulatory Visit: Payer: PPO

## 2021-05-15 DIAGNOSIS — G959 Disease of spinal cord, unspecified: Secondary | ICD-10-CM | POA: Diagnosis not present

## 2021-05-16 ENCOUNTER — Other Ambulatory Visit (HOSPITAL_COMMUNITY): Payer: Self-pay | Admitting: Neurosurgery

## 2021-05-16 ENCOUNTER — Other Ambulatory Visit: Payer: Self-pay | Admitting: Neurosurgery

## 2021-05-16 DIAGNOSIS — G959 Disease of spinal cord, unspecified: Secondary | ICD-10-CM

## 2021-05-19 ENCOUNTER — Other Ambulatory Visit: Payer: Self-pay | Admitting: Neurosurgery

## 2021-05-19 DIAGNOSIS — G959 Disease of spinal cord, unspecified: Secondary | ICD-10-CM

## 2021-06-11 ENCOUNTER — Ambulatory Visit (HOSPITAL_COMMUNITY)
Admission: RE | Admit: 2021-06-11 | Discharge: 2021-06-11 | Disposition: A | Discharge status: Home / Self Care | Payer: PPO | Source: Ambulatory Visit | Attending: Neurosurgery | Admitting: Neurosurgery

## 2021-06-11 ENCOUNTER — Other Ambulatory Visit: Payer: Self-pay

## 2021-06-11 DIAGNOSIS — R262 Difficulty in walking, not elsewhere classified: Secondary | ICD-10-CM | POA: Diagnosis not present

## 2021-06-11 DIAGNOSIS — M2578 Osteophyte, vertebrae: Secondary | ICD-10-CM | POA: Diagnosis not present

## 2021-06-11 DIAGNOSIS — G959 Disease of spinal cord, unspecified: Secondary | ICD-10-CM | POA: Insufficient documentation

## 2021-06-11 DIAGNOSIS — M4322 Fusion of spine, cervical region: Secondary | ICD-10-CM | POA: Diagnosis not present

## 2021-06-11 DIAGNOSIS — M4803 Spinal stenosis, cervicothoracic region: Secondary | ICD-10-CM | POA: Diagnosis not present

## 2021-06-12 DIAGNOSIS — E118 Type 2 diabetes mellitus with unspecified complications: Secondary | ICD-10-CM | POA: Diagnosis not present

## 2021-06-12 DIAGNOSIS — I10 Essential (primary) hypertension: Secondary | ICD-10-CM | POA: Diagnosis not present

## 2021-06-12 DIAGNOSIS — Z125 Encounter for screening for malignant neoplasm of prostate: Secondary | ICD-10-CM | POA: Diagnosis not present

## 2021-06-18 DIAGNOSIS — G20 Parkinson's disease: Secondary | ICD-10-CM | POA: Diagnosis not present

## 2021-06-18 DIAGNOSIS — Z87442 Personal history of urinary calculi: Secondary | ICD-10-CM | POA: Diagnosis not present

## 2021-06-18 DIAGNOSIS — H8113 Benign paroxysmal vertigo, bilateral: Secondary | ICD-10-CM | POA: Diagnosis not present

## 2021-06-18 DIAGNOSIS — M1990 Unspecified osteoarthritis, unspecified site: Secondary | ICD-10-CM | POA: Diagnosis not present

## 2021-06-18 DIAGNOSIS — Z8551 Personal history of malignant neoplasm of bladder: Secondary | ICD-10-CM | POA: Diagnosis not present

## 2021-06-18 DIAGNOSIS — F341 Dysthymic disorder: Secondary | ICD-10-CM | POA: Diagnosis not present

## 2021-06-18 DIAGNOSIS — I10 Essential (primary) hypertension: Secondary | ICD-10-CM | POA: Diagnosis not present

## 2021-06-18 DIAGNOSIS — E782 Mixed hyperlipidemia: Secondary | ICD-10-CM | POA: Diagnosis not present

## 2021-06-18 DIAGNOSIS — M4802 Spinal stenosis, cervical region: Secondary | ICD-10-CM | POA: Diagnosis not present

## 2021-06-18 DIAGNOSIS — R944 Abnormal results of kidney function studies: Secondary | ICD-10-CM | POA: Diagnosis not present

## 2021-06-18 DIAGNOSIS — E118 Type 2 diabetes mellitus with unspecified complications: Secondary | ICD-10-CM | POA: Diagnosis not present

## 2021-06-26 DIAGNOSIS — I10 Essential (primary) hypertension: Secondary | ICD-10-CM | POA: Diagnosis not present

## 2021-06-26 DIAGNOSIS — E1165 Type 2 diabetes mellitus with hyperglycemia: Secondary | ICD-10-CM | POA: Diagnosis not present

## 2021-07-03 DIAGNOSIS — Z20822 Contact with and (suspected) exposure to covid-19: Secondary | ICD-10-CM | POA: Diagnosis not present

## 2021-07-08 DIAGNOSIS — G959 Disease of spinal cord, unspecified: Secondary | ICD-10-CM | POA: Diagnosis not present

## 2021-07-08 DIAGNOSIS — G629 Polyneuropathy, unspecified: Secondary | ICD-10-CM | POA: Diagnosis not present

## 2021-07-08 DIAGNOSIS — R2689 Other abnormalities of gait and mobility: Secondary | ICD-10-CM | POA: Diagnosis not present

## 2021-07-09 ENCOUNTER — Other Ambulatory Visit: Payer: Self-pay | Admitting: Neurosurgery

## 2021-07-09 DIAGNOSIS — R2689 Other abnormalities of gait and mobility: Secondary | ICD-10-CM

## 2021-07-14 DIAGNOSIS — H8113 Benign paroxysmal vertigo, bilateral: Secondary | ICD-10-CM | POA: Diagnosis not present

## 2021-07-14 DIAGNOSIS — M4802 Spinal stenosis, cervical region: Secondary | ICD-10-CM | POA: Diagnosis not present

## 2021-07-14 DIAGNOSIS — M1990 Unspecified osteoarthritis, unspecified site: Secondary | ICD-10-CM | POA: Diagnosis not present

## 2021-07-14 DIAGNOSIS — G20 Parkinson's disease: Secondary | ICD-10-CM | POA: Diagnosis not present

## 2021-07-14 DIAGNOSIS — F341 Dysthymic disorder: Secondary | ICD-10-CM | POA: Diagnosis not present

## 2021-07-19 ENCOUNTER — Ambulatory Visit
Admission: RE | Admit: 2021-07-19 | Discharge: 2021-07-19 | Disposition: A | Discharge status: Home / Self Care | Payer: PPO | Source: Ambulatory Visit | Attending: Neurosurgery | Admitting: Neurosurgery

## 2021-07-19 ENCOUNTER — Other Ambulatory Visit: Payer: Self-pay

## 2021-07-19 DIAGNOSIS — I6782 Cerebral ischemia: Secondary | ICD-10-CM | POA: Diagnosis not present

## 2021-07-19 DIAGNOSIS — I619 Nontraumatic intracerebral hemorrhage, unspecified: Secondary | ICD-10-CM | POA: Diagnosis not present

## 2021-07-19 DIAGNOSIS — R2689 Other abnormalities of gait and mobility: Secondary | ICD-10-CM

## 2021-07-19 DIAGNOSIS — G9389 Other specified disorders of brain: Secondary | ICD-10-CM | POA: Diagnosis not present

## 2021-07-27 DIAGNOSIS — I10 Essential (primary) hypertension: Secondary | ICD-10-CM | POA: Diagnosis not present

## 2021-07-27 DIAGNOSIS — E1165 Type 2 diabetes mellitus with hyperglycemia: Secondary | ICD-10-CM | POA: Diagnosis not present

## 2021-08-12 DIAGNOSIS — M961 Postlaminectomy syndrome, not elsewhere classified: Secondary | ICD-10-CM | POA: Diagnosis not present

## 2021-08-12 DIAGNOSIS — R202 Paresthesia of skin: Secondary | ICD-10-CM | POA: Diagnosis not present

## 2021-08-12 DIAGNOSIS — R2689 Other abnormalities of gait and mobility: Secondary | ICD-10-CM | POA: Diagnosis not present

## 2021-08-19 DIAGNOSIS — G959 Disease of spinal cord, unspecified: Secondary | ICD-10-CM | POA: Diagnosis not present

## 2021-08-19 DIAGNOSIS — Z6829 Body mass index (BMI) 29.0-29.9, adult: Secondary | ICD-10-CM | POA: Diagnosis not present

## 2021-08-19 DIAGNOSIS — I10 Essential (primary) hypertension: Secondary | ICD-10-CM | POA: Diagnosis not present

## 2021-08-20 ENCOUNTER — Other Ambulatory Visit: Payer: Self-pay | Admitting: Neurosurgery

## 2021-08-20 DIAGNOSIS — G959 Disease of spinal cord, unspecified: Secondary | ICD-10-CM

## 2021-08-26 ENCOUNTER — Ambulatory Visit
Admission: RE | Admit: 2021-08-26 | Discharge: 2021-08-26 | Disposition: A | Discharge status: Home / Self Care | Payer: PPO | Source: Ambulatory Visit | Attending: Neurosurgery | Admitting: Neurosurgery

## 2021-08-26 ENCOUNTER — Other Ambulatory Visit: Payer: Self-pay

## 2021-08-26 DIAGNOSIS — M4312 Spondylolisthesis, cervical region: Secondary | ICD-10-CM | POA: Diagnosis not present

## 2021-08-26 DIAGNOSIS — G959 Disease of spinal cord, unspecified: Secondary | ICD-10-CM

## 2021-08-26 DIAGNOSIS — M4802 Spinal stenosis, cervical region: Secondary | ICD-10-CM | POA: Diagnosis not present

## 2021-08-26 DIAGNOSIS — M4322 Fusion of spine, cervical region: Secondary | ICD-10-CM | POA: Diagnosis not present

## 2021-08-26 MED ORDER — MEPERIDINE HCL 50 MG/ML IJ SOLN
50.0000 mg | Freq: Once | INTRAMUSCULAR | Status: DC | PRN
Start: 2021-08-26 — End: 2021-08-27

## 2021-08-26 MED ORDER — ONDANSETRON HCL 4 MG/2ML IJ SOLN: 4.0000 mg | Freq: Once | INTRAMUSCULAR | Status: DC | PRN

## 2021-08-26 MED ORDER — IOPAMIDOL (ISOVUE-M 300) INJECTION 61%
10.0000 mL | Freq: Once | INTRAMUSCULAR | Status: AC
  Administered 2021-08-26: 10 mL via INTRATHECAL

## 2021-08-26 MED ORDER — DIAZEPAM 5 MG PO TABS
5.0000 mg | ORAL_TABLET | Freq: Once | ORAL | Status: AC
  Administered 2021-08-26: 5 mg via ORAL

## 2021-08-26 NOTE — Discharge Instructions (Signed)
Myelogram Discharge Instructions  Go home and rest quietly as needed. You may resume normal activities; however, do not exert yourself strongly or do any heavy lifting today and tomorrow.   DO NOT drive today.    You may resume your normal diet and medications unless otherwise indicated. Drink a lot of extra fluids today and tomorrow.   The incidence of a spinal headache (headache, nausea and/or vomiting is about 5% (one in 20 patients).  If you develop a headache when you are sitting up or standing that gets better when you lie down, please lie flat for 24 hours and drink plenty of fluids until the headache goes away.  Caffeinated beverages may be helpful.   If you develop severe nausea and vomiting or a headache that does not go away with the flat bedrest after 48 hours, please call [phone].   Call your physician for a follow-up appointment.  The results of your myelogram will be sent directly to your physician by the following day.  Please call us at [phone] if you have any questions or if complications develop after you arrive home.   Discharge instructions have been explained to the patient.  The patient, or the person responsible for the patient, state they fully understands these instructions.   Thank you for visiting our office today.   

## 2021-08-27 DIAGNOSIS — E1165 Type 2 diabetes mellitus with hyperglycemia: Secondary | ICD-10-CM | POA: Diagnosis not present

## 2021-08-27 DIAGNOSIS — I10 Essential (primary) hypertension: Secondary | ICD-10-CM | POA: Diagnosis not present

## 2021-09-09 DIAGNOSIS — Z6829 Body mass index (BMI) 29.0-29.9, adult: Secondary | ICD-10-CM | POA: Diagnosis not present

## 2021-09-09 DIAGNOSIS — I10 Essential (primary) hypertension: Secondary | ICD-10-CM | POA: Diagnosis not present

## 2021-09-09 DIAGNOSIS — R2689 Other abnormalities of gait and mobility: Secondary | ICD-10-CM | POA: Diagnosis not present

## 2021-09-09 DIAGNOSIS — M542 Cervicalgia: Secondary | ICD-10-CM | POA: Diagnosis not present

## 2021-09-19 DIAGNOSIS — I10 Essential (primary) hypertension: Secondary | ICD-10-CM | POA: Diagnosis not present

## 2021-09-22 DIAGNOSIS — Z8551 Personal history of malignant neoplasm of bladder: Secondary | ICD-10-CM | POA: Diagnosis not present

## 2021-09-22 DIAGNOSIS — R809 Proteinuria, unspecified: Secondary | ICD-10-CM | POA: Diagnosis not present

## 2021-09-22 DIAGNOSIS — M1990 Unspecified osteoarthritis, unspecified site: Secondary | ICD-10-CM | POA: Diagnosis not present

## 2021-09-22 DIAGNOSIS — Z0001 Encounter for general adult medical examination with abnormal findings: Secondary | ICD-10-CM | POA: Diagnosis not present

## 2021-09-22 DIAGNOSIS — Z87442 Personal history of urinary calculi: Secondary | ICD-10-CM | POA: Diagnosis not present

## 2021-09-22 DIAGNOSIS — E782 Mixed hyperlipidemia: Secondary | ICD-10-CM | POA: Diagnosis not present

## 2021-09-22 DIAGNOSIS — M4802 Spinal stenosis, cervical region: Secondary | ICD-10-CM | POA: Diagnosis not present

## 2021-09-22 DIAGNOSIS — Z23 Encounter for immunization: Secondary | ICD-10-CM | POA: Diagnosis not present

## 2021-09-22 DIAGNOSIS — I10 Essential (primary) hypertension: Secondary | ICD-10-CM | POA: Diagnosis not present

## 2021-09-22 DIAGNOSIS — R944 Abnormal results of kidney function studies: Secondary | ICD-10-CM | POA: Diagnosis not present

## 2021-09-22 DIAGNOSIS — H8113 Benign paroxysmal vertigo, bilateral: Secondary | ICD-10-CM | POA: Diagnosis not present

## 2021-09-22 DIAGNOSIS — E118 Type 2 diabetes mellitus with unspecified complications: Secondary | ICD-10-CM | POA: Diagnosis not present

## 2021-09-23 ENCOUNTER — Encounter: Payer: Self-pay | Admitting: Neurology
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
## 2021-10-11 DIAGNOSIS — N202 Calculus of kidney with calculus of ureter: Secondary | ICD-10-CM | POA: Diagnosis not present

## 2021-10-14 ENCOUNTER — Other Ambulatory Visit: Payer: Self-pay

## 2021-10-14 ENCOUNTER — Ambulatory Visit: Payer: PPO | Admitting: Neurology

## 2021-10-14 ENCOUNTER — Encounter: Payer: Self-pay | Admitting: Neurology

## 2021-10-14 VITALS — BP 132/71 | HR 79 | Ht 73.0 in | Wt 219.2 lb

## 2021-10-14 DIAGNOSIS — R251 Tremor, unspecified: Secondary | ICD-10-CM | POA: Diagnosis not present

## 2021-10-14 DIAGNOSIS — G20 Parkinson's disease: Secondary | ICD-10-CM

## 2021-10-29 ENCOUNTER — Encounter (HOSPITAL_COMMUNITY)
Admission: RE | Admit: 2021-10-29 | Discharge: 2021-10-29 | Disposition: A | Discharge status: Home / Self Care | Payer: PPO | Source: Ambulatory Visit | Attending: Neurology | Admitting: Neurology

## 2021-10-29 ENCOUNTER — Other Ambulatory Visit: Payer: Self-pay

## 2021-10-29 DIAGNOSIS — G20 Parkinson's disease: Secondary | ICD-10-CM | POA: Diagnosis not present

## 2021-10-29 DIAGNOSIS — R251 Tremor, unspecified: Secondary | ICD-10-CM | POA: Insufficient documentation

## 2021-10-29 DIAGNOSIS — R2689 Other abnormalities of gait and mobility: Secondary | ICD-10-CM | POA: Diagnosis not present

## 2021-10-29 MED ORDER — IOFLUPANE I 123 185 MBQ/2.5ML IV SOLN
4.9000 | Freq: Once | INTRAVENOUS | Status: AC | PRN
  Administered 2021-10-29: 4.9 via INTRAVENOUS
  Filled 2021-10-29: qty 5

## 2021-10-29 MED ORDER — POTASSIUM IODIDE (ANTIDOTE) 130 MG PO TABS: 130.0000 mg | ORAL_TABLET | Freq: Once | ORAL | Status: DC

## 2021-10-29 MED ORDER — POTASSIUM IODIDE (ANTIDOTE) 130 MG PO TABS
ORAL_TABLET | ORAL | Status: AC
  Filled 2021-10-29: qty 1

## 2021-11-04 DIAGNOSIS — R0981 Nasal congestion: Secondary | ICD-10-CM | POA: Diagnosis not present

## 2021-11-04 DIAGNOSIS — R059 Cough, unspecified: Secondary | ICD-10-CM | POA: Diagnosis not present

## 2021-11-04 DIAGNOSIS — J029 Acute pharyngitis, unspecified: Secondary | ICD-10-CM | POA: Diagnosis not present

## 2021-11-04 DIAGNOSIS — J069 Acute upper respiratory infection, unspecified: Secondary | ICD-10-CM | POA: Diagnosis not present

## 2021-12-07 ENCOUNTER — Other Ambulatory Visit (HOSPITAL_COMMUNITY): Payer: Self-pay | Admitting: Psychiatry

## 2021-12-08 ENCOUNTER — Other Ambulatory Visit (HOSPITAL_COMMUNITY): Payer: Self-pay | Admitting: Psychiatry

## 2021-12-08 NOTE — Telephone Encounter (Signed)
Call for appt

## 2021-12-16 NOTE — Progress Notes (Signed)
Virtual Visit Via Video   The purpose of this virtual visit is to provide medical care while limiting exposure to the novel coronavirus.    Consent was obtained for video visit:  Yes.   Answered questions that patient had about telehealth interaction:  Yes.   I discussed the limitations, risks, security and privacy concerns of performing an evaluation and management service by telemedicine. I also discussed with the patient that there may be a patient responsible charge related to this service. The patient expressed understanding and agreed to proceed.  Pt location: Home Physician Location: office Name of referring provider:  Celene Squibb, MD I connected with Mora Appl Genrich at patients initiation/request on 12/18/2021 at 11:15 AM EST by video enabled telemedicine application and verified that I am speaking with the correct person using two identifiers. Pt MRN:  [id number] Pt DOB:  19-Jul-1946 Video Participants:  [name];  wife supplements hx   Assessment/Plan:   1.  Gait instability  -Does not meet clinical criteria for Parkinson's disease.  DaTscan negative.  I do not think he has Parkinson's disease.  Could potentially have some vascular issues.  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  Discussed physical therapy.  He will let me know if he needs a referral.  -Patient has certainly had some spinal stenosis issues, and discussed that that certainly could cause some balance issues as well.  He is going to follow-up with Dr. Saintclair Halsted.  Told him that Dr. Saintclair Halsted just wanted to make sure that he did not have superimposed Parkinson's.  2.  Memory change  -Discussed whether or not he would like to do neurocognitive testing.  At this point, I really not convinced he has a neuro degenerative condition.  Because of the long wait, the patient's wife really would like to get him on a schedule to get that done, and patient was agreeable.  3.  If patient needs follow-up for memory change, he  will follow-up with Clarise Cruz.  Otherwise, follow-up as needed.   Subjective:   Zachary [name] was seen today in follow up for results of DaTscan.  My previous records as well as any outside records available were reviewed prior to todays visit.  Pt had DaTscan completed on October 29, 2021.  This was normal.  I personally reviewed the images.  Wife mentions concerns about memory issues.  Patient states that he notes a delayed processing speed, but generally his answers are able to come back to him once he thinks about it.   CURRENT MEDICATIONS:  Outpatient Encounter Medications as of 12/18/2021  Medication Sig   acetaminophen (TYLENOL) 500 MG tablet Take 1,000 mg by mouth at bedtime as needed (for pain.).    DULoxetine (CYMBALTA) 60 MG capsule TAKE 1 CAPSULE(60 MG) BY MOUTH DAILY   HYDROcodone-acetaminophen (NORCO/VICODIN) 5-325 MG tablet Take 1 tablet by mouth every 6 (six) hours as needed.   ibuprofen (ADVIL) 200 MG tablet Take 200 mg by mouth every 6 (six) hours as needed for fever or mild pain.   meloxicam (MOBIC) 15 MG tablet Take 15 mg by mouth daily as needed.   pravastatin (PRAVACHOL) 10 MG tablet Take 10 mg by mouth at bedtime.   valsartan-hydrochlorothiazide (DIOVAN-HCT) 320-25 MG tablet Take 1 tablet by mouth daily.    No facility-administered encounter medications on file as of 12/18/2021.     Objective:   PHYSICAL EXAMINATION:    VITALS:   Vitals:   12/18/21 1044  Weight:  226 lb (102.5 kg)    GEN:  The patient appears stated age and is in NAD. HEENT:  Normocephalic, atraumatic.    Neurological examination:   Orientation: The patient is alert and oriented x3.  Cranial nerves: There is good facial symmetry.  The patient's speech is fluent and clear.   Follow up Instructions      -I discussed the assessment and treatment plan with the patient. The patient was provided an opportunity to ask questions and all were answered. The patient agreed with the plan and  demonstrated an understanding of the instructions.   The patient was advised to call back or seek an in-person evaluation if the symptoms worsen or if the condition fails to improve as anticipated.     Alonza Bogus, DO   Cc:  Celene Squibb, MD

## 2021-12-18 ENCOUNTER — Encounter: Payer: Self-pay | Admitting: Neurology

## 2021-12-18 ENCOUNTER — Other Ambulatory Visit: Payer: Self-pay

## 2021-12-18 ENCOUNTER — Telehealth (INDEPENDENT_AMBULATORY_CARE_PROVIDER_SITE_OTHER): Payer: PPO | Admitting: Neurology

## 2021-12-18 VITALS — Wt 226.0 lb

## 2021-12-18 DIAGNOSIS — R413 Other amnesia: Secondary | ICD-10-CM | POA: Diagnosis not present

## 2021-12-18 DIAGNOSIS — R269 Unspecified abnormalities of gait and mobility: Secondary | ICD-10-CM | POA: Diagnosis not present

## 2021-12-25 DIAGNOSIS — I10 Essential (primary) hypertension: Secondary | ICD-10-CM | POA: Diagnosis not present

## 2021-12-25 DIAGNOSIS — E118 Type 2 diabetes mellitus with unspecified complications: Secondary | ICD-10-CM | POA: Diagnosis not present

## 2021-12-31 DIAGNOSIS — I10 Essential (primary) hypertension: Secondary | ICD-10-CM | POA: Diagnosis not present

## 2021-12-31 DIAGNOSIS — R809 Proteinuria, unspecified: Secondary | ICD-10-CM | POA: Diagnosis not present

## 2021-12-31 DIAGNOSIS — Z8551 Personal history of malignant neoplasm of bladder: Secondary | ICD-10-CM | POA: Diagnosis not present

## 2021-12-31 DIAGNOSIS — Z8673 Personal history of transient ischemic attack (TIA), and cerebral infarction without residual deficits: Secondary | ICD-10-CM | POA: Diagnosis not present

## 2021-12-31 DIAGNOSIS — M4802 Spinal stenosis, cervical region: Secondary | ICD-10-CM | POA: Diagnosis not present

## 2021-12-31 DIAGNOSIS — Z87442 Personal history of urinary calculi: Secondary | ICD-10-CM | POA: Diagnosis not present

## 2021-12-31 DIAGNOSIS — E782 Mixed hyperlipidemia: Secondary | ICD-10-CM | POA: Diagnosis not present

## 2021-12-31 DIAGNOSIS — R42 Dizziness and giddiness: Secondary | ICD-10-CM | POA: Diagnosis not present

## 2021-12-31 DIAGNOSIS — M1990 Unspecified osteoarthritis, unspecified site: Secondary | ICD-10-CM | POA: Diagnosis not present

## 2021-12-31 DIAGNOSIS — E118 Type 2 diabetes mellitus with unspecified complications: Secondary | ICD-10-CM | POA: Diagnosis not present

## 2021-12-31 DIAGNOSIS — R944 Abnormal results of kidney function studies: Secondary | ICD-10-CM | POA: Diagnosis not present

## 2022-01-23 DIAGNOSIS — Z1211 Encounter for screening for malignant neoplasm of colon: Secondary | ICD-10-CM | POA: Diagnosis not present

## 2022-01-25 DIAGNOSIS — G4733 Obstructive sleep apnea (adult) (pediatric): Secondary | ICD-10-CM | POA: Diagnosis not present

## 2022-01-30 LAB — COLOGUARD: COLOGUARD: POSITIVE — AB

## 2022-02-02 DIAGNOSIS — R944 Abnormal results of kidney function studies: Secondary | ICD-10-CM | POA: Diagnosis not present

## 2022-02-03 ENCOUNTER — Encounter (INDEPENDENT_AMBULATORY_CARE_PROVIDER_SITE_OTHER): Payer: Self-pay | Admitting: *Deleted

## 2022-03-02 ENCOUNTER — Encounter (HOSPITAL_COMMUNITY): Payer: Self-pay | Admitting: Psychiatry

## 2022-03-02 ENCOUNTER — Telehealth (INDEPENDENT_AMBULATORY_CARE_PROVIDER_SITE_OTHER): Payer: PPO | Admitting: Psychiatry

## 2022-03-02 ENCOUNTER — Other Ambulatory Visit: Payer: Self-pay

## 2022-03-02 DIAGNOSIS — F331 Major depressive disorder, recurrent, moderate: Secondary | ICD-10-CM | POA: Diagnosis not present

## 2022-03-02 MED ORDER — DULOXETINE HCL 60 MG PO CPEP: ORAL_CAPSULE | ORAL | 3 refills | Status: DC

## 2022-03-02 NOTE — Progress Notes (Signed)
Virtual Visit via Telephone Note  I connected with Zachary [name] on 03/02/22 at  3:20 PM EST by telephone and verified that I am speaking with the correct person using two identifiers.  Location: Patient: home Provider: office   I discussed the limitations, risks, security and privacy concerns of performing an evaluation and management service by telephone and the availability of in person appointments. I also discussed with the patient that there may be a patient responsible charge related to this service. The patient expressed understanding and agreed to proceed.     I discussed the assessment and treatment plan with the patient. The patient was provided an opportunity to ask questions and all were answered. The patient agreed with the plan and demonstrated an understanding of the instructions.   The patient was advised to call back or seek an in-person evaluation if the symptoms worsen or if the condition fails to improve as anticipated.  I provided 15 minutes of non-face-to-face time during this encounter.   [name], MD  [facility] MD/PA/NP OP Progress Note  03/02/2022 3:42 PM Zachary [name]  MRN:  [id number]  Chief Complaint:  Chief Complaint  Patient presents with   Depression   Follow-up   HPI: This patient is a 76 year-old married white male who lives with his wife in [place] they have no children. 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    The patient states he got depressed several years ago after his diagnosed with stage IV bladder cancer. He was seeing a psychologist here to deal with this and last year was placed on Lexapro. His cancer is gone now after 5 surgeries.   Patient returns after a long absence.  He was last seen about 18 months ago.  He states that he generally he is doing well.  He is going to a new neurologist, Dr. Carles Collet who stated that he probably does not have Parkinson's disease.  He is not on any medication for this.  He states his  tremor has not worsened.  He is working out in [employer] at the senior center 3 times a week.  He is sleeping well.  He denies significant depression or anxiety.  The only medication that he takes from here is Cymbalta and it seems to be keeping his depression at [place].  He denies thoughts of health self-harm or suicidal ideation. Visit Diagnosis:    ICD-10-CM   1. Major depressive disorder, recurrent episode, moderate (HCC)  F33.1       Past Psychiatric History: Outpatient treatment of depression and anxiety secondary to medical issues for the last several years  Past Medical History:  Past Medical History:  Diagnosis Date   Anxiety    takes Valium as needed   Arthritis    Bladder cancer ([place])    takes Rapaflo daily   Blood dyscrasia    07/08/16: pt told he was a "free bleeder" after bleeding a lot when derm cut off mole on forehead. No excessive bleeding with minor wounds at home, no bleeding problems perioperatively with prior surgeries.   Chronic back pain    spondylolisthesis   Cluster headaches    Depression    takes Lexapro daily   Essential hypertension, benign    takes Diovan-HCT daily   Hearing loss    hearing aids   Hx of complications due to general anesthesia    Aborted surgery 07/15/2016 due to hypotension per pt   Joint pain  Kidney stone 03/2019   passed stone   Obstructive sleep apnea on CPAP    uses CPAP @ night   Parkinsonism ([place])    Pneumonia    "several times"--last time about 3-4 yrs ago   Prediabetes    Syncopal episodes    Thyroid condition     Past Surgical History:  Procedure Laterality Date   ANTERIOR CERVICAL DECOMP/DISCECTOMY FUSION N/A 06/05/2013   Procedure:  C3-4 Anterior Cervical Discectomy and Fusion, Allograft, Plate;  Surgeon: [name], MD;  Location: [place];  Service: Orthopedics;  Laterality: N/A;  C3-4 Anterior Cervical Discectomy and Fusion, Allograft, Plate   Arthroscopic knee surgery     BACK SURGERY      x 2   BLADDER SURGERY      x 5 to remove tumor   CARPAL TUNNEL RELEASE     CATARACT EXTRACTION W/PHACO  12/19/2012   Procedure: CATARACT EXTRACTION PHACO AND INTRAOCULAR LENS PLACEMENT ([place]);  Surgeon: [name], MD;  Location: [facility];  Service: Ophthalmology;  Laterality: Left;  CDE: 26.80   CATARACT EXTRACTION W/PHACO  01/09/2013   Procedure: CATARACT EXTRACTION PHACO AND INTRAOCULAR LENS PLACEMENT (I[facility]);  Surgeon: [name], MD;  Location: [facility];  Service: Ophthalmology;  Laterality: Right;  CDE:22.83   CERVICAL FUSION  06/05/2013   C 3  C4   CYSTOSCOPY     CYSTOSCOPY W/ URETERAL STENT PLACEMENT Bilateral 02/12/2020   Procedure: CYSTOSCOPY WITH RETROGRADE PYELOGRAM/URETERAL STENT PLACEMENT;  Surgeon: Irine Seal, MD;  Location: [facility];  Service: Urology;  Laterality: Bilateral;   LITHOTRIPSY  02/2020   REFRACTIVE SURGERY     Hx: of   RETINAL DETACHMENT SURGERY     TOTAL KNEE ARTHROPLASTY Left 02/01/2018   Procedure: TOTAL KNEE ARTHROPLASTY;  Surgeon: Carole Civil, MD;  Location: [facility];  Service: Orthopedics;  Laterality: Left;   TRANSURETHRAL RESECTION OF BLADDER TUMOR Right 07/27/2015   Procedure: TRANSURETHRAL RESECTION OF BLADDER TUMOR (TURBT);  Surgeon: [name], MD;  [facility];  Service: Urology;  Laterality: Right;   wisdom tooth extraction      Family Psychiatric History: see below  Family History:  Family History  Problem Relation Age of Onset   Hypertension Father    Cancer Father        Prostate   Depression Mother    Hypertension Mother    Alcohol abuse Mother    COPD Mother        passed 07-08-17   Hypertension Sister    Anxiety disorder Sister    Alcohol abuse Maternal Grandfather    ADD / ADHD Neg Hx    Bipolar disorder Neg Hx    Dementia Neg Hx    Drug abuse Neg Hx    OCD Neg Hx    Paranoid behavior Neg Hx    Schizophrenia Neg Hx    Seizures Neg Hx    Sexual abuse Neg Hx    Physical abuse Neg Hx     Social History:  Social History   Socioeconomic  History   Marital status: Married    Spouse name: Electrical engineer    Number of children: 0   Years of education: 13   Highest education level: Not on file  Occupational History   Occupation: Retired    Comment: med Designer, [employer]    Employer: UNEMPLOYED  Tobacco Use   Smoking status: Former    Types: Cigarettes    Quit date: 12/30/1976    Years since  quitting: 45.2   Smokeless tobacco: Never   Tobacco comments:    1985  Vaping Use   Vaping Use: Never used  Substance and Sexual Activity   Alcohol use: No    Comment: quit 1990   Drug use: No   Sexual activity: Yes  Other Topics Concern   Not on file  Social History Narrative   Lives with wife    caffeine- sodas   Right handed   Retired Horticulturist, commercial grass in the neighborhood    Social Determinants of Radio broadcast assistant Strain: Not on file  Food Insecurity: Not on file  Transportation Needs: Not on file  Physical Activity: Not on file  Stress: Not on file  Social Connections: Not on file    Allergies:  Allergies  Allergen Reactions   Sulfonamide Derivatives Itching and Rash   Sulfa Antibiotics     Metabolic Disorder Labs: Lab Results  Component Value Date   HGBA1C 5.8 (H) 07/22/2016   MPG 120 07/22/2016   No results found for: PROLACTIN No results found for: CHOL, TRIG, HDL, CHOLHDL, VLDL, LDLCALC Lab Results  Component Value Date   TSH 2.701 02/03/2021    Therapeutic Level Labs: No results found for: LITHIUM No results found for: VALPROATE No components found for:  CBMZ  Current Medications: Current Outpatient Medications  Medication Sig Dispense Refill   acetaminophen (TYLENOL) 500 MG tablet Take 1,000 mg by mouth at bedtime as needed (for pain.).      DULoxetine (CYMBALTA) 60 MG capsule TAKE 1 CAPSULE(60 MG) BY MOUTH DAILY 90 capsule 3   HYDROcodone-acetaminophen (NORCO/VICODIN) 5-325 MG tablet Take 1 tablet by mouth every 6 (six) hours as needed.     ibuprofen (ADVIL) 200 MG tablet Take 200 mg by  mouth every 6 (six) hours as needed for fever or mild pain.     meloxicam (MOBIC) 15 MG tablet Take 15 mg by mouth daily as needed.     pravastatin (PRAVACHOL) 10 MG tablet Take 10 mg by mouth at bedtime.     valsartan-hydrochlorothiazide (DIOVAN-HCT) 320-25 MG tablet Take 1 tablet by mouth daily.      No current facility-administered medications for this visit.     Musculoskeletal: Strength & Muscle Tone: na Gait & Station: na Patient leans: N/A  Psychiatric Specialty Exam: Review of Systems  Musculoskeletal:  Positive for gait problem.  Neurological:  Positive for tremors.  All other systems reviewed and are negative.  There were no vitals taken for this visit.There is no height or weight on file to calculate BMI.  General Appearance: na  Eye Contact:  NA  Speech:  normal  Volume:  Normal  Mood:  Euthymic  Affect:  NA  Thought Process:  Goal Directed  Orientation:  Full (Time, Place, and Person)  Thought Content: WDL   Suicidal Thoughts:  No  Homicidal Thoughts:  No  Memory:  Immediate;   Good Recent;   Good Remote;   NA  Judgement:  Good  Insight:  Fair  Psychomotor Activity:  Decreased  Concentration:  Concentration: Good and Attention Span: Good  Recall:  Good  Fund of Knowledge: Good  Language: Good  Akathisia:  No  Handed:  Right  AIMS (if indicated): not done  Assets:  Communication Skills Desire for Improvement Resilience Social Support Talents/Skills  ADL's:  Intact  Cognition: WNL  Sleep:  Good   Screenings: Mini-Mental    Flowsheet Row Office Visit from 10/15/2020 in [facility] Office Visit from  10/10/2019 in [facility] Office Visit from 09/27/2018 in [facility] Office Visit from 12/22/2017 in [facility] Office Visit from 09/21/2017 in [facility]  Total Score (max 30 points ) '27 28 29 30 28      '$ PHQ2-9    Flowsheet Row Video Visit from 03/02/2022  in [place] Office Visit from 09/20/2018 in [facility]  PHQ-2 Total Score 0 0      Flowsheet Row Video Visit from 03/02/2022 in [place] [facility] from 04/05/2021 in [facility] from 02/03/2021 in [place] No Risk No Risk No Risk        Assessment and Plan: This patient is a 76 year old male with a history depression and anxiety.  He continues to do well on the Cymbalta 60 mg daily.  He will continue this dosage and return to see me in 6 months  Collaboration of Care: Collaboration of Care: Primary Care Provider AEB chart notes will be made available to PCP at patient's request  Patient/Guardian was advised Release of Information must be obtained prior to any record release in order to collaborate their care with an outside provider. Patient/Guardian was advised if they have not already done so to contact the registration department to sign all necessary forms in order for [place] to release information regarding their care.   Consent: Patient/Guardian gives verbal consent for treatment and assignment of benefits for services provided during this visit. Patient/Guardian expressed understanding and agreed to proceed.    [name], MD 03/02/2022, 3:42 PM

## 2022-03-04 ENCOUNTER — Other Ambulatory Visit (HOSPITAL_COMMUNITY): Payer: Self-pay | Admitting: Nephrology

## 2022-03-04 ENCOUNTER — Other Ambulatory Visit: Payer: Self-pay | Admitting: Nephrology

## 2022-03-04 DIAGNOSIS — I129 Hypertensive chronic kidney disease with stage 1 through stage 4 chronic kidney disease, or unspecified chronic kidney disease: Secondary | ICD-10-CM | POA: Diagnosis not present

## 2022-03-04 DIAGNOSIS — I5032 Chronic diastolic (congestive) heart failure: Secondary | ICD-10-CM | POA: Diagnosis not present

## 2022-03-04 DIAGNOSIS — N170 Acute kidney failure with tubular necrosis: Secondary | ICD-10-CM

## 2022-03-04 DIAGNOSIS — N1832 Chronic kidney disease, stage 3b: Secondary | ICD-10-CM

## 2022-03-04 DIAGNOSIS — I952 Hypotension due to drugs: Secondary | ICD-10-CM | POA: Diagnosis not present

## 2022-03-04 DIAGNOSIS — R809 Proteinuria, unspecified: Secondary | ICD-10-CM | POA: Diagnosis not present

## 2022-03-11 DIAGNOSIS — D511 Vitamin B12 deficiency anemia due to selective vitamin B12 malabsorption with proteinuria: Secondary | ICD-10-CM | POA: Diagnosis not present

## 2022-03-11 DIAGNOSIS — N170 Acute kidney failure with tubular necrosis: Secondary | ICD-10-CM | POA: Diagnosis not present

## 2022-03-11 DIAGNOSIS — I952 Hypotension due to drugs: Secondary | ICD-10-CM | POA: Diagnosis not present

## 2022-03-11 DIAGNOSIS — R809 Proteinuria, unspecified: Secondary | ICD-10-CM | POA: Diagnosis not present

## 2022-03-11 DIAGNOSIS — I129 Hypertensive chronic kidney disease with stage 1 through stage 4 chronic kidney disease, or unspecified chronic kidney disease: Secondary | ICD-10-CM | POA: Diagnosis not present

## 2022-03-11 DIAGNOSIS — N1832 Chronic kidney disease, stage 3b: Secondary | ICD-10-CM | POA: Diagnosis not present

## 2022-03-13 ENCOUNTER — Ambulatory Visit (HOSPITAL_COMMUNITY)
Admission: RE | Admit: 2022-03-13 | Discharge: 2022-03-13 | Disposition: A | Discharge status: Home / Self Care | Payer: PPO | Source: Ambulatory Visit | Attending: Nephrology | Admitting: Nephrology

## 2022-03-13 ENCOUNTER — Other Ambulatory Visit: Payer: Self-pay

## 2022-03-13 DIAGNOSIS — N170 Acute kidney failure with tubular necrosis: Secondary | ICD-10-CM | POA: Diagnosis not present

## 2022-03-13 DIAGNOSIS — N189 Chronic kidney disease, unspecified: Secondary | ICD-10-CM | POA: Diagnosis not present

## 2022-03-13 DIAGNOSIS — I129 Hypertensive chronic kidney disease with stage 1 through stage 4 chronic kidney disease, or unspecified chronic kidney disease: Secondary | ICD-10-CM | POA: Insufficient documentation

## 2022-03-13 DIAGNOSIS — N1832 Chronic kidney disease, stage 3b: Secondary | ICD-10-CM | POA: Diagnosis not present

## 2022-03-13 DIAGNOSIS — N1330 Unspecified hydronephrosis: Secondary | ICD-10-CM | POA: Diagnosis not present

## 2022-04-02 DIAGNOSIS — E8722 Chronic metabolic acidosis: Secondary | ICD-10-CM | POA: Diagnosis not present

## 2022-04-02 DIAGNOSIS — E790 Hyperuricemia without signs of inflammatory arthritis and tophaceous disease: Secondary | ICD-10-CM | POA: Diagnosis not present

## 2022-04-02 DIAGNOSIS — I5032 Chronic diastolic (congestive) heart failure: Secondary | ICD-10-CM | POA: Diagnosis not present

## 2022-04-02 DIAGNOSIS — N200 Calculus of kidney: Secondary | ICD-10-CM | POA: Diagnosis not present

## 2022-04-02 DIAGNOSIS — R7303 Prediabetes: Secondary | ICD-10-CM | POA: Diagnosis not present

## 2022-04-02 DIAGNOSIS — I129 Hypertensive chronic kidney disease with stage 1 through stage 4 chronic kidney disease, or unspecified chronic kidney disease: Secondary | ICD-10-CM | POA: Diagnosis not present

## 2022-04-02 DIAGNOSIS — N1832 Chronic kidney disease, stage 3b: Secondary | ICD-10-CM | POA: Diagnosis not present

## 2022-04-02 DIAGNOSIS — R760 Raised antibody titer: Secondary | ICD-10-CM | POA: Diagnosis not present

## 2022-04-02 DIAGNOSIS — N132 Hydronephrosis with renal and ureteral calculous obstruction: Secondary | ICD-10-CM | POA: Diagnosis not present

## 2022-04-02 DIAGNOSIS — R809 Proteinuria, unspecified: Secondary | ICD-10-CM | POA: Diagnosis not present

## 2022-04-16 DIAGNOSIS — N132 Hydronephrosis with renal and ureteral calculous obstruction: Secondary | ICD-10-CM | POA: Diagnosis not present

## 2022-04-16 DIAGNOSIS — N202 Calculus of kidney with calculus of ureter: Secondary | ICD-10-CM | POA: Diagnosis not present

## 2022-04-16 DIAGNOSIS — I723 Aneurysm of iliac artery: Secondary | ICD-10-CM | POA: Diagnosis not present

## 2022-04-16 DIAGNOSIS — Z8551 Personal history of malignant neoplasm of bladder: Secondary | ICD-10-CM | POA: Diagnosis not present

## 2022-04-16 DIAGNOSIS — K4020 Bilateral inguinal hernia, without obstruction or gangrene, not specified as recurrent: Secondary | ICD-10-CM | POA: Diagnosis not present

## 2022-04-22 ENCOUNTER — Other Ambulatory Visit: Payer: Self-pay | Admitting: Ophthalmology

## 2022-04-22 DIAGNOSIS — N401 Enlarged prostate with lower urinary tract symptoms: Secondary | ICD-10-CM | POA: Diagnosis not present

## 2022-04-22 DIAGNOSIS — N132 Hydronephrosis with renal and ureteral calculous obstruction: Secondary | ICD-10-CM | POA: Diagnosis not present

## 2022-04-22 DIAGNOSIS — N202 Calculus of kidney with calculus of ureter: Secondary | ICD-10-CM | POA: Diagnosis not present

## 2022-04-22 DIAGNOSIS — N2889 Other specified disorders of kidney and ureter: Secondary | ICD-10-CM | POA: Diagnosis not present

## 2022-04-22 DIAGNOSIS — D3021 Benign neoplasm of right ureter: Secondary | ICD-10-CM | POA: Diagnosis not present

## 2022-04-22 DIAGNOSIS — I781 Nevus, non-neoplastic: Secondary | ICD-10-CM | POA: Diagnosis not present

## 2022-04-22 DIAGNOSIS — N138 Other obstructive and reflux uropathy: Secondary | ICD-10-CM | POA: Diagnosis not present

## 2022-04-24 DIAGNOSIS — M1990 Unspecified osteoarthritis, unspecified site: Secondary | ICD-10-CM | POA: Diagnosis not present

## 2022-04-24 DIAGNOSIS — R7303 Prediabetes: Secondary | ICD-10-CM | POA: Diagnosis not present

## 2022-04-24 DIAGNOSIS — I10 Essential (primary) hypertension: Secondary | ICD-10-CM | POA: Diagnosis not present

## 2022-04-29 DIAGNOSIS — Z8551 Personal history of malignant neoplasm of bladder: Secondary | ICD-10-CM | POA: Diagnosis not present

## 2022-04-29 DIAGNOSIS — Z87442 Personal history of urinary calculi: Secondary | ICD-10-CM | POA: Diagnosis not present

## 2022-04-29 DIAGNOSIS — Z8673 Personal history of transient ischemic attack (TIA), and cerebral infarction without residual deficits: Secondary | ICD-10-CM | POA: Diagnosis not present

## 2022-04-29 DIAGNOSIS — E782 Mixed hyperlipidemia: Secondary | ICD-10-CM | POA: Diagnosis not present

## 2022-04-29 DIAGNOSIS — I10 Essential (primary) hypertension: Secondary | ICD-10-CM | POA: Diagnosis not present

## 2022-04-29 DIAGNOSIS — M1990 Unspecified osteoarthritis, unspecified site: Secondary | ICD-10-CM | POA: Diagnosis not present

## 2022-04-29 DIAGNOSIS — R944 Abnormal results of kidney function studies: Secondary | ICD-10-CM | POA: Diagnosis not present

## 2022-04-29 DIAGNOSIS — R809 Proteinuria, unspecified: Secondary | ICD-10-CM | POA: Diagnosis not present

## 2022-04-29 DIAGNOSIS — E118 Type 2 diabetes mellitus with unspecified complications: Secondary | ICD-10-CM | POA: Diagnosis not present

## 2022-04-29 DIAGNOSIS — D649 Anemia, unspecified: Secondary | ICD-10-CM | POA: Diagnosis not present

## 2022-04-29 DIAGNOSIS — R42 Dizziness and giddiness: Secondary | ICD-10-CM | POA: Diagnosis not present

## 2022-04-29 DIAGNOSIS — M4802 Spinal stenosis, cervical region: Secondary | ICD-10-CM | POA: Diagnosis not present

## 2022-04-30 DIAGNOSIS — R3914 Feeling of incomplete bladder emptying: Secondary | ICD-10-CM | POA: Diagnosis not present

## 2022-04-30 DIAGNOSIS — N401 Enlarged prostate with lower urinary tract symptoms: Secondary | ICD-10-CM | POA: Diagnosis not present

## 2022-05-04 DIAGNOSIS — N202 Calculus of kidney with calculus of ureter: Secondary | ICD-10-CM | POA: Diagnosis not present

## 2022-05-07 DIAGNOSIS — M1990 Unspecified osteoarthritis, unspecified site: Secondary | ICD-10-CM | POA: Diagnosis not present

## 2022-05-07 DIAGNOSIS — R42 Dizziness and giddiness: Secondary | ICD-10-CM | POA: Diagnosis not present

## 2022-05-07 DIAGNOSIS — N1832 Chronic kidney disease, stage 3b: Secondary | ICD-10-CM | POA: Diagnosis not present

## 2022-05-07 DIAGNOSIS — Z6830 Body mass index (BMI) 30.0-30.9, adult: Secondary | ICD-10-CM | POA: Diagnosis not present

## 2022-05-07 DIAGNOSIS — N202 Calculus of kidney with calculus of ureter: Secondary | ICD-10-CM | POA: Diagnosis not present

## 2022-05-07 DIAGNOSIS — I10 Essential (primary) hypertension: Secondary | ICD-10-CM | POA: Diagnosis not present

## 2022-05-07 DIAGNOSIS — Z8551 Personal history of malignant neoplasm of bladder: Secondary | ICD-10-CM | POA: Diagnosis not present

## 2022-05-19 DIAGNOSIS — N170 Acute kidney failure with tubular necrosis: Secondary | ICD-10-CM | POA: Diagnosis not present

## 2022-05-19 DIAGNOSIS — I129 Hypertensive chronic kidney disease with stage 1 through stage 4 chronic kidney disease, or unspecified chronic kidney disease: Secondary | ICD-10-CM | POA: Diagnosis not present

## 2022-05-19 DIAGNOSIS — R809 Proteinuria, unspecified: Secondary | ICD-10-CM | POA: Diagnosis not present

## 2022-05-19 DIAGNOSIS — N1832 Chronic kidney disease, stage 3b: Secondary | ICD-10-CM | POA: Diagnosis not present

## 2022-05-19 DIAGNOSIS — E8722 Chronic metabolic acidosis: Secondary | ICD-10-CM | POA: Diagnosis not present

## 2022-05-19 DIAGNOSIS — I5032 Chronic diastolic (congestive) heart failure: Secondary | ICD-10-CM | POA: Diagnosis not present

## 2022-05-19 DIAGNOSIS — E790 Hyperuricemia without signs of inflammatory arthritis and tophaceous disease: Secondary | ICD-10-CM | POA: Diagnosis not present

## 2022-05-19 DIAGNOSIS — N132 Hydronephrosis with renal and ureteral calculous obstruction: Secondary | ICD-10-CM | POA: Diagnosis not present

## 2022-05-30 DIAGNOSIS — I129 Hypertensive chronic kidney disease with stage 1 through stage 4 chronic kidney disease, or unspecified chronic kidney disease: Secondary | ICD-10-CM | POA: Diagnosis not present

## 2022-05-30 DIAGNOSIS — I5032 Chronic diastolic (congestive) heart failure: Secondary | ICD-10-CM | POA: Diagnosis not present

## 2022-05-30 DIAGNOSIS — N132 Hydronephrosis with renal and ureteral calculous obstruction: Secondary | ICD-10-CM | POA: Diagnosis not present

## 2022-05-30 DIAGNOSIS — N1832 Chronic kidney disease, stage 3b: Secondary | ICD-10-CM | POA: Diagnosis not present

## 2022-05-30 DIAGNOSIS — R809 Proteinuria, unspecified: Secondary | ICD-10-CM | POA: Diagnosis not present

## 2022-05-30 DIAGNOSIS — N170 Acute kidney failure with tubular necrosis: Secondary | ICD-10-CM | POA: Diagnosis not present

## 2022-05-30 DIAGNOSIS — N200 Calculus of kidney: Secondary | ICD-10-CM | POA: Diagnosis not present

## 2022-06-12 DIAGNOSIS — I5032 Chronic diastolic (congestive) heart failure: Secondary | ICD-10-CM | POA: Diagnosis not present

## 2022-06-12 DIAGNOSIS — R809 Proteinuria, unspecified: Secondary | ICD-10-CM | POA: Diagnosis not present

## 2022-06-12 DIAGNOSIS — N1832 Chronic kidney disease, stage 3b: Secondary | ICD-10-CM | POA: Diagnosis not present

## 2022-06-12 DIAGNOSIS — N132 Hydronephrosis with renal and ureteral calculous obstruction: Secondary | ICD-10-CM | POA: Diagnosis not present

## 2022-06-12 DIAGNOSIS — N170 Acute kidney failure with tubular necrosis: Secondary | ICD-10-CM | POA: Diagnosis not present

## 2022-06-12 DIAGNOSIS — I129 Hypertensive chronic kidney disease with stage 1 through stage 4 chronic kidney disease, or unspecified chronic kidney disease: Secondary | ICD-10-CM | POA: Diagnosis not present

## 2022-06-12 DIAGNOSIS — N200 Calculus of kidney: Secondary | ICD-10-CM | POA: Diagnosis not present

## 2022-07-01 DIAGNOSIS — I129 Hypertensive chronic kidney disease with stage 1 through stage 4 chronic kidney disease, or unspecified chronic kidney disease: Secondary | ICD-10-CM | POA: Diagnosis not present

## 2022-07-01 DIAGNOSIS — N1832 Chronic kidney disease, stage 3b: Secondary | ICD-10-CM | POA: Diagnosis not present

## 2022-07-01 DIAGNOSIS — I5032 Chronic diastolic (congestive) heart failure: Secondary | ICD-10-CM | POA: Diagnosis not present

## 2022-07-01 DIAGNOSIS — D638 Anemia in other chronic diseases classified elsewhere: Secondary | ICD-10-CM | POA: Diagnosis not present

## 2022-07-01 DIAGNOSIS — R809 Proteinuria, unspecified: Secondary | ICD-10-CM | POA: Diagnosis not present

## 2022-07-29 ENCOUNTER — Encounter (INDEPENDENT_AMBULATORY_CARE_PROVIDER_SITE_OTHER): Payer: Self-pay | Admitting: *Deleted

## 2022-07-30 ENCOUNTER — Encounter: Payer: PPO | Admitting: Psychology

## 2022-08-06 ENCOUNTER — Encounter: Payer: PPO | Admitting: Psychology

## 2022-09-11 ENCOUNTER — Telehealth: Payer: Self-pay

## 2022-09-11 DIAGNOSIS — D638 Anemia in other chronic diseases classified elsewhere: Secondary | ICD-10-CM | POA: Diagnosis not present

## 2022-09-11 DIAGNOSIS — N1832 Chronic kidney disease, stage 3b: Secondary | ICD-10-CM | POA: Diagnosis not present

## 2022-09-11 DIAGNOSIS — R809 Proteinuria, unspecified: Secondary | ICD-10-CM | POA: Diagnosis not present

## 2022-09-11 DIAGNOSIS — I5032 Chronic diastolic (congestive) heart failure: Secondary | ICD-10-CM | POA: Diagnosis not present

## 2022-09-11 DIAGNOSIS — I129 Hypertensive chronic kidney disease with stage 1 through stage 4 chronic kidney disease, or unspecified chronic kidney disease: Secondary | ICD-10-CM | POA: Diagnosis not present

## 2022-09-11 NOTE — Patient Outreach (Signed)
  Care Coordination   Initial Visit Note   09/11/2022 Name: Zachary [name] MRN: [id number] DOB: [birth year]  Zachary [name] is a 76 y.o. year old male who sees [name], Edwinna Areola, MD for primary care. I spoke with  Mora Appl Lynn by phone today.  What matters to the patients health and wellness today?  No Concerns Expressed. Requested call back.    Goals Addressed   None     SDOH assessments and interventions completed:  No     Care Coordination Interventions Activated:  No  Care Coordination Interventions:  No, not indicated   Follow up plan:  Will follow up next week.    Encounter Outcome:  Pt. Request to Call Back   [place] Management [phone]

## 2022-09-16 DIAGNOSIS — N1832 Chronic kidney disease, stage 3b: Secondary | ICD-10-CM | POA: Diagnosis not present

## 2022-09-16 DIAGNOSIS — I129 Hypertensive chronic kidney disease with stage 1 through stage 4 chronic kidney disease, or unspecified chronic kidney disease: Secondary | ICD-10-CM | POA: Diagnosis not present

## 2022-09-16 DIAGNOSIS — I5032 Chronic diastolic (congestive) heart failure: Secondary | ICD-10-CM | POA: Diagnosis not present

## 2022-09-16 DIAGNOSIS — R809 Proteinuria, unspecified: Secondary | ICD-10-CM | POA: Diagnosis not present

## 2022-09-16 DIAGNOSIS — N132 Hydronephrosis with renal and ureteral calculous obstruction: Secondary | ICD-10-CM | POA: Diagnosis not present

## 2022-09-22 DIAGNOSIS — N1832 Chronic kidney disease, stage 3b: Secondary | ICD-10-CM | POA: Diagnosis not present

## 2022-09-22 DIAGNOSIS — N132 Hydronephrosis with renal and ureteral calculous obstruction: Secondary | ICD-10-CM | POA: Diagnosis not present

## 2022-09-22 DIAGNOSIS — I129 Hypertensive chronic kidney disease with stage 1 through stage 4 chronic kidney disease, or unspecified chronic kidney disease: Secondary | ICD-10-CM | POA: Diagnosis not present

## 2022-09-22 DIAGNOSIS — I5032 Chronic diastolic (congestive) heart failure: Secondary | ICD-10-CM | POA: Diagnosis not present

## 2022-09-22 DIAGNOSIS — R809 Proteinuria, unspecified: Secondary | ICD-10-CM | POA: Diagnosis not present

## 2022-10-23 DIAGNOSIS — H35033 Hypertensive retinopathy, bilateral: Secondary | ICD-10-CM | POA: Diagnosis not present

## 2022-10-29 DIAGNOSIS — R809 Proteinuria, unspecified: Secondary | ICD-10-CM | POA: Diagnosis not present

## 2022-10-29 DIAGNOSIS — R944 Abnormal results of kidney function studies: Secondary | ICD-10-CM | POA: Diagnosis not present

## 2022-10-29 DIAGNOSIS — Z125 Encounter for screening for malignant neoplasm of prostate: Secondary | ICD-10-CM | POA: Diagnosis not present

## 2022-10-29 DIAGNOSIS — E782 Mixed hyperlipidemia: Secondary | ICD-10-CM | POA: Diagnosis not present

## 2022-10-30 DIAGNOSIS — H401133 Primary open-angle glaucoma, bilateral, severe stage: Secondary | ICD-10-CM | POA: Diagnosis not present

## 2022-11-03 DIAGNOSIS — I10 Essential (primary) hypertension: Secondary | ICD-10-CM | POA: Diagnosis not present

## 2022-11-03 DIAGNOSIS — R42 Dizziness and giddiness: Secondary | ICD-10-CM | POA: Diagnosis not present

## 2022-11-03 DIAGNOSIS — Z8551 Personal history of malignant neoplasm of bladder: Secondary | ICD-10-CM | POA: Diagnosis not present

## 2022-11-03 DIAGNOSIS — R944 Abnormal results of kidney function studies: Secondary | ICD-10-CM | POA: Diagnosis not present

## 2022-11-03 DIAGNOSIS — E118 Type 2 diabetes mellitus with unspecified complications: Secondary | ICD-10-CM | POA: Diagnosis not present

## 2022-11-03 DIAGNOSIS — Z8673 Personal history of transient ischemic attack (TIA), and cerebral infarction without residual deficits: Secondary | ICD-10-CM | POA: Diagnosis not present

## 2022-11-03 DIAGNOSIS — M4802 Spinal stenosis, cervical region: Secondary | ICD-10-CM | POA: Diagnosis not present

## 2022-11-03 DIAGNOSIS — R809 Proteinuria, unspecified: Secondary | ICD-10-CM | POA: Diagnosis not present

## 2022-11-03 DIAGNOSIS — E782 Mixed hyperlipidemia: Secondary | ICD-10-CM | POA: Diagnosis not present

## 2022-11-03 DIAGNOSIS — M1990 Unspecified osteoarthritis, unspecified site: Secondary | ICD-10-CM | POA: Diagnosis not present

## 2022-11-03 DIAGNOSIS — Z87442 Personal history of urinary calculi: Secondary | ICD-10-CM | POA: Diagnosis not present

## 2022-11-03 DIAGNOSIS — Z0000 Encounter for general adult medical examination without abnormal findings: Secondary | ICD-10-CM | POA: Diagnosis not present

## 2022-11-07 DIAGNOSIS — J019 Acute sinusitis, unspecified: Secondary | ICD-10-CM | POA: Diagnosis not present

## 2022-11-07 DIAGNOSIS — R059 Cough, unspecified: Secondary | ICD-10-CM | POA: Diagnosis not present

## 2022-11-13 ENCOUNTER — Ambulatory Visit (INDEPENDENT_AMBULATORY_CARE_PROVIDER_SITE_OTHER): Payer: PPO

## 2022-11-13 ENCOUNTER — Ambulatory Visit
Admission: EM | Admit: 2022-11-13 | Discharge: 2022-11-13 | Disposition: A | Discharge status: Home / Self Care | Payer: PPO

## 2022-11-13 DIAGNOSIS — R0789 Other chest pain: Secondary | ICD-10-CM | POA: Diagnosis not present

## 2022-11-13 DIAGNOSIS — R079 Chest pain, unspecified: Secondary | ICD-10-CM

## 2022-11-13 DIAGNOSIS — J40 Bronchitis, not specified as acute or chronic: Secondary | ICD-10-CM | POA: Diagnosis not present

## 2022-11-13 MED ORDER — PROMETHAZINE-DM 6.25-15 MG/5ML PO SYRP: 5.0000 mL | ORAL_SOLUTION | Freq: Four times a day (QID) | ORAL | 0 refills | Status: DC | PRN

## 2022-11-13 MED ORDER — GUAIFENESIN ER 600 MG PO TB12: 600.0000 mg | ORAL_TABLET | Freq: Two times a day (BID) | ORAL | 0 refills | Status: DC

## 2022-11-13 MED ORDER — ALBUTEROL SULFATE HFA 108 (90 BASE) MCG/ACT IN AERS: 2.0000 | INHALATION_SPRAY | RESPIRATORY_TRACT | 0 refills | Status: DC | PRN

## 2022-11-13 NOTE — ED Triage Notes (Signed)
Pt reports he had covid last Thursday they gave him a Zpack. Also was given codeine cough syrup. Complaining of coughing, spitting up mucus, feels congested, voice is horse. Cough meds he is taking is giving him slight relief.

## 2022-11-13 NOTE — ED Provider Notes (Signed)
[facility]    CSN: [id number] Arrival date & time: 11/13/22  1116      History   Chief Complaint No chief complaint on file.   HPI Zachary [name] is a 76 y.o. male.   Patient presenting today with ongoing hacking productive cough worse in the morning following COVID-19 diagnosis last week.  States PCP gave him azithromycin and codeine cough syrup which may have helped slightly, finished these medications 2 days ago and continues to have the terrible cough sometimes productive of thick mucus.  He denies fever, chills, body aches, chest pain, shortness of breath at this time.  Not taking anything over-the-counter for symptoms at this time.  History of pneumonia.    Past Medical History:  Diagnosis Date   Anxiety    takes Valium as needed   Arthritis    Bladder cancer ([place])    takes Rapaflo daily   Blood dyscrasia    07/08/16: pt told he was a "free bleeder" after bleeding a lot when derm cut off mole on forehead. No excessive bleeding with minor wounds at home, no bleeding problems perioperatively with prior surgeries.   Chronic back pain    spondylolisthesis   Cluster headaches    Depression    takes Lexapro daily   Essential hypertension, benign    takes Diovan-HCT daily   Hearing loss    hearing aids   Hx of complications due to general anesthesia    Aborted surgery 07/15/2016 due to hypotension per pt   Joint pain    Kidney stone 03/2019   passed stone   Obstructive sleep apnea on CPAP    uses CPAP @ night   Parkinsonism    Pneumonia    "several times"--last time about 3-4 yrs ago   Prediabetes    Syncopal episodes    Thyroid condition     Patient Active Problem List   Diagnosis Date Noted   Left ureteral stone 02/12/2020   Subclinical hyperthyroidism 09/20/2018   Prediabetes 09/20/2018   S/P total knee replacement, 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 02/07/2018   Primary osteoarthritis of left knee 02/01/2018   Postural hypotension 07/23/2016   Dysphagia 07/23/2016    Obstructive sleep apnea 07/21/2016   Hyperglycemia 07/21/2016   Anemia, unspecified 07/21/2016   Spinal stenosis of lumbar region 07/15/2016   Bladder tumor 07/27/2015   Epiretinal membrane (ERM) of left eye 03/14/2014   H/O retinal detachment 03/14/2014   Pseudophakia of both eyes 03/14/2014   Retinal detachment 03/14/2014   Visual distortion 03/14/2014   HNP (herniated nucleus pulposus), cervical 06/05/2013    Class: Diagnosis of   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   Muscle spasms of neck 04/04/2013   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 anxiety 03/01/2013   OA (osteoarthritis) of knee 02/22/2013   Iliotibial band syndrome of left side 11/22/2012   Localized, primary osteoarthritis of the ankle and foot 10/11/2012   Difficulty in walking(719.7) 08/17/2012   Peroneal tendinitis 08/16/2012   Precordial pain 02/09/2012   Essential hypertension, benign 02/09/2012   Mixed hyperlipidemia 02/09/2012   Dysthymia 02/04/2012   Abnormality of gait 12/23/2011   Lateral epicondylitis/tennis elbow 05/06/2011   TENOSYNOVITIS OF FOOT AND ANKLE 10/22/2010    Past Surgical History:  Procedure Laterality Date   ANTERIOR CERVICAL DECOMP/DISCECTOMY FUSION N/A 06/05/2013   Procedure:  C3-4 Anterior Cervical Discectomy and Fusion, Allograft, Plate;  Surgeon: [name], MD;  Location: [place];  Service: Orthopedics;  Laterality: N/A;  C3-4 Anterior Cervical Discectomy and Fusion, Allograft,  Plate   Arthroscopic knee surgery     BACK SURGERY      x 2   BLADDER SURGERY     x 5 to remove tumor   CARPAL TUNNEL RELEASE     CATARACT EXTRACTION W/PHACO  12/19/2012   Procedure: CATARACT EXTRACTION PHACO AND INTRAOCULAR LENS PLACEMENT ([facility]);  Surgeon: [name], MD;  Location: [facility];  Service: Ophthalmology;  Laterality: Left;  CDE: 26.80   CATARACT EXTRACTION W/PHACO  01/09/2013   Procedure: CATARACT EXTRACTION PHACO AND INTRAOCULAR LENS PLACEMENT ([facility]);  Surgeon: [name], MD;  Location: [facility];  Service:  Ophthalmology;  Laterality: Right;  CDE:22.83   CERVICAL FUSION  06/05/2013   C 3  C4   CYSTOSCOPY     CYSTOSCOPY W/ URETERAL STENT PLACEMENT Bilateral 02/12/2020   Procedure: CYSTOSCOPY WITH RETROGRADE PYELOGRAM/URETERAL STENT PLACEMENT;  Surgeon: Irine Seal, MD;  [facility];  Service: Urology;  Laterality: Bilateral;   LITHOTRIPSY  02/2020   REFRACTIVE SURGERY     Hx: of   RETINAL DETACHMENT SURGERY     TOTAL KNEE ARTHROPLASTY Left 02/01/2018   Procedure: TOTAL KNEE ARTHROPLASTY;  Surgeon: Carole Civil, MD;  Location: [facility];  Service: Orthopedics;  Laterality: Left;   TRANSURETHRAL RESECTION OF BLADDER TUMOR Right 07/27/2015   Procedure: TRANSURETHRAL RESECTION OF BLADDER TUMOR (TURBT);  Surgeon: [name], MD;  [facility];  Service: Urology;  Laterality: Right;   wisdom tooth extraction         Home Medications    Prior to Admission medications   Medication Sig Start Date End Date Taking? Authorizing Provider  albuterol (VENTOLIN HFA) 108 (90 Base) MCG/ACT inhaler Inhale 2 puffs into the lungs every 4 (four) hours as needed for wheezing or shortness of breath. 11/13/22  Yes Volney American, PA-C  guaiFENesin (MUCINEX) 600 MG 12 hr tablet Take 1 tablet (600 mg total) by mouth 2 (two) times daily. 11/13/22  Yes Volney American, PA-C  promethazine-dextromethorphan (PROMETHAZINE-DM) 6.25-15 MG/5ML syrup Take 5 mLs by mouth 4 (four) times daily as needed. 11/13/22  Yes Volney American, PA-C  acetaminophen (TYLENOL) 500 MG tablet Take 1,000 mg by mouth at bedtime as needed (for pain.).     [provider]  carvedilol (COREG) 3.125 MG tablet Take 3.125 mg by mouth 2 (two) times daily.    [provider]  DULoxetine (CYMBALTA) 60 MG capsule TAKE 1 CAPSULE(60 MG) BY MOUTH DAILY 03/02/22   Cloria Spring, MD  HYDROcodone-acetaminophen (NORCO/VICODIN) 5-325 MG tablet Take 1 tablet by mouth every 6 (six) hours as needed. 10/01/21    [provider]  ibuprofen (ADVIL) 200 MG tablet Take 200 mg by mouth every 6 (six) hours as needed for fever or mild pain.    [provider]  meloxicam (MOBIC) 15 MG tablet Take 15 mg by mouth daily as needed. 09/27/21   [provider]  pravastatin (PRAVACHOL) 10 MG tablet Take 10 mg by mouth at bedtime. 01/23/20   [provider]  valsartan-hydrochlorothiazide (DIOVAN-HCT) 320-25 MG tablet Take 1 tablet by mouth daily.  08/07/19   [provider]    Family History Family History  Problem Relation Age of Onset   Hypertension Father    Cancer Father        Prostate   Depression Mother    Hypertension Mother    Alcohol abuse Mother    COPD Mother        passed 07-08-17   Hypertension  Sister    Anxiety disorder Sister    Alcohol abuse Maternal Grandfather    ADD / ADHD Neg Hx    Bipolar disorder Neg Hx    Dementia Neg Hx    Drug abuse Neg Hx    OCD Neg Hx    Paranoid behavior Neg Hx    Schizophrenia Neg Hx    Seizures Neg Hx    Sexual abuse Neg Hx    Physical abuse Neg Hx     Social History Social History   Tobacco Use   Smoking status: Former    Types: Cigarettes    Quit date: 12/30/1976    Years since quitting: 45.9   Smokeless tobacco: Never   Tobacco comments:    1985  Vaping Use   Vaping Use: Never used  Substance Use Topics   Alcohol use: No    Comment: quit 1990   Drug use: No     Allergies   Sulfonamide derivatives and Sulfa antibiotics   Review of Systems Review of Systems HPI  Physical Exam Triage Vital Signs ED Triage Vitals  Enc Vitals Group     BP 11/13/22 1242 (!) 144/76     Pulse Rate 11/13/22 1242 82     Resp 11/13/22 1242 18     Temp 11/13/22 1242 98.5 F (36.9 C)     Temp Source 11/13/22 1242 Oral     SpO2 11/13/22 1242 91 %     Weight --      Height --      Head Circumference --      Peak Flow --      Pain Score 11/13/22 1249 0     Pain Loc --      Pain Edu? --      Excl. in [place]? --     No data found.  Updated Vital Signs BP (!) 144/76 (BP Location: Right Arm)   Pulse 82   Temp 98.5 F (36.9 C) (Oral)   Resp 18   SpO2 91%   Visual Acuity Right Eye Distance:   Left Eye Distance:   Bilateral Distance:    Right Eye Near:   Left Eye Near:    Bilateral Near:     Physical Exam Vitals and nursing note reviewed.  Constitutional:      Appearance: He is well-developed.  HENT:     Head: Atraumatic.     Right Ear: External ear normal.     Left Ear: External ear normal.     Nose: Nose normal.     Mouth/Throat:     Pharynx: Posterior oropharyngeal erythema present. No oropharyngeal exudate.  Eyes:     Conjunctiva/sclera: Conjunctivae normal.     Pupils: Pupils are equal, round, and reactive to light.  Cardiovascular:     Rate and Rhythm: Normal rate and regular rhythm.  Pulmonary:     Effort: Pulmonary effort is normal. No respiratory distress.     Breath sounds: No wheezing or rales.  Musculoskeletal:        General: Normal range of motion.     Cervical back: Normal range of motion and neck supple.  Lymphadenopathy:     Cervical: No cervical adenopathy.  Skin:    General: Skin is warm and dry.  Neurological:     Mental Status: He is alert and oriented to person, place, and time.  Psychiatric:        Behavior: Behavior normal.      UC Treatments / Results  Labs (all labs ordered are listed, but only abnormal results are displayed) Labs Reviewed - No data to display  EKG   Radiology DG Chest 2 View  Result Date: 11/13/2022 CLINICAL DATA:  Chest tightness.  History of pneumonia. EXAM: CHEST - 2 VIEW COMPARISON:  02/03/2021 FINDINGS: Patient rotated to the left. Midline trachea. Borderline cardiomegaly. Atherosclerosis in the transverse aorta. No pleural effusion or pneumothorax. Prominent anterior first ribs, greater right than left. Clear lungs. Mild right hemidiaphragm elevation. IMPRESSION: No acute cardiopulmonary disease. Aortic  Atherosclerosis (ICD10-I70.0). Electronically Signed   By: Abigail Miyamoto M.D.   On: 11/13/2022 13:21    Procedures Procedures (including critical care time)  Medications Ordered in UC Medications - No data to display  Initial Impression / Assessment and Plan / UC Course  I have reviewed the triage vital signs and the nursing notes.  Pertinent labs & imaging results that were available during my care of the patient were reviewed by me and considered in my medical decision making (see chart for details).     Vital signs very reassuring today, oxygen saturation on room air upon recheck was 95% and he is speaking in full sentences, breathing comfortably and in no acute distress.  Chest x-ray was performed given prolonged course and history of pneumonia, this was negative for acute cardiopulmonary disease.  He just completed the azithromycin, will also add albuterol inhaler, another round of cough syrup and Mucinex.  Discussed supportive over-the-counter medications and home care additionally.  Return for any worsening symptoms.  Final Clinical Impressions(s) / UC Diagnoses   Final diagnoses:  Bronchitis   Discharge Instructions   None    ED Prescriptions     Medication Sig Dispense Auth. Provider   albuterol (VENTOLIN HFA) 108 (90 Base) MCG/ACT inhaler Inhale 2 puffs into the lungs every 4 (four) hours as needed for wheezing or shortness of breath. [place], [place]   promethazine-dextromethorphan (PROMETHAZINE-DM) 6.25-15 MG/5ML syrup Take 5 mLs by mouth 4 (four) times daily as needed. 100 mL Volney American, PA-C   guaiFENesin (MUCINEX) 600 MG 12 hr tablet Take 1 tablet (600 mg total) by mouth 2 (two) times daily. 20 tablet Volney American, [place]      PDMP not reviewed this encounter.   Volney American, [place] 11/13/22 1414

## 2022-11-23 DIAGNOSIS — H401133 Primary open-angle glaucoma, bilateral, severe stage: Secondary | ICD-10-CM | POA: Diagnosis not present

## 2022-11-26 ENCOUNTER — Other Ambulatory Visit: Payer: Self-pay | Admitting: Family Medicine

## 2022-11-26 NOTE — Telephone Encounter (Signed)
Unable to refill per protocol, last refill by another provider. Provider not at this practice, will refuse.  Requested Prescriptions  Pending Prescriptions Disp Refills   albuterol (VENTOLIN HFA) 108 (90 Base) MCG/ACT inhaler [Pharmacy Med Name: ALBUTEROL HFA INH(200 PUFFS) 18GM] 18 g 0    Sig: INHALE 2 PUFFS INTO THE LUNGS EVERY 4 HOURS AS NEEDED FOR WHEEZING OR SHORTNESS OF BREATH     There is no refill protocol information for this order

## 2022-11-27 NOTE — Progress Notes (Unsigned)
Assessment/Plan:   Gait instability             -Does not meet clinical criteria for Parkinson's disease.  DaTscan negative.  I do not think he has Parkinson's disease.  discussed today and last visit.  Could potentially have some vascular issues.  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               -Patient has certainly had some spinal stenosis issues, and discussed that that certainly could cause some balance issues as well.  He has not had follow-up with Dr. Dellis Anes and encouraged him to do so.  -Patient looks like he could have some peripheral neuropathy.  He definitely is complaining about paresthesias in the feet for a few years.  EMG apparently was attempted in the past with Dr. Brien Few (presumably for lumbar radiculopathy) and unfortunately patient could not tolerate it and does not wish to proceed with that.  We discussed nature and pathophysiology of peripheral neuropathy.  I looked at some lab work.  He has renal insufficiency and has had some blood sugars that were a little bit high, but unclear if these were fasting or not.  We will go ahead and get B12, folate, hemoglobin A1c, SPEP/UPEP with immunofixation.  -Discussed with patient that if he has peripheral neuropathy, the best treatment for the balance really would be physical therapy.  He will think about that.   2.  Memory change             -I am not really convinced of a neurodegenerative condition.  He did have neurocognitive testing scheduled for August, 2023 but canceled that appointment.  Wife asked several times about his memory today.  I reiterated that he really would need to proceed with neurocognitive testing.  Patient is not that excited, but stated that he could do that.  I expressed that we are booked out extensively for this, unfortunately, and they can reschedule if they would like to do that.   Subjective:   Zachary [name] was seen today in the movement disorders clinic for follow up.  Pt with wife who  supplements hx.  I last saw him 1 year ago.  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  At the time, he wanted to hold on PT.  Patient/wife were concerned last visit about memory change and we schedule neurocognitive testing, which was scheduled for August but they canceled that testing.  He returns today to discuss balance change, which is a bit worse.  "I can't be stable without a stick or walker, especially out in the yard."  No falls.  He has feet paresthesias.  He has also been dx with "severe glaucoma."  No diabetes.  Wife states that "his memory is really bad."   ALLERGIES:   Allergies  Allergen Reactions   Sulfonamide Derivatives Itching and Rash   Sulfa Antibiotics     CURRENT MEDICATIONS:  Current Outpatient Medications  Medication Instructions   acetaminophen (TYLENOL) 1,000 mg, Oral, At bedtime PRN   albuterol (VENTOLIN HFA) 108 (90 Base) MCG/ACT inhaler 2 puffs, Inhalation, Every 4 hours PRN   carvedilol (COREG) 3.125 mg, Oral, 2 times daily   DULoxetine (CYMBALTA) 60 MG capsule TAKE 1 CAPSULE(60 MG) BY MOUTH DAILY   guaiFENesin (MUCINEX) 600 mg, Oral, 2 times daily   HYDROcodone-acetaminophen (NORCO/VICODIN) 5-325 MG tablet 1 tablet, Oral, Every  6 hours PRN   ibuprofen (ADVIL) 200 mg, Oral, Every 6 hours PRN   meloxicam (MOBIC) 15 mg, Daily PRN   pravastatin (PRAVACHOL) 10 mg, Oral, Daily at bedtime   promethazine-dextromethorphan (PROMETHAZINE-DM) 6.25-15 MG/5ML syrup 5 mLs, Oral, 4 times daily PRN   valsartan-hydrochlorothiazide (DIOVAN-HCT) 320-25 MG tablet 1 tablet, Oral, Daily    Objective:   VITALS:   Vitals:   12/01/22 0811  BP: (!) 177/91  Pulse: 91  SpO2: 100%  Weight: 214 lb 3.2 oz (97.2 kg)  Height: '6\' 1"'$  (1.854 m)     GEN:  The patient appears stated age and is in NAD. HEENT:  Normocephalic, atraumatic.  The mucous membranes are moist. The  superficial temporal arteries are without ropiness or tenderness. CV:  RRR Lungs:  CTAB Neck/HEME:  There are no carotid bruits bilaterally.  Neurological examination:  Orientation: The patient is alert and oriented x3.  Cranial nerves: There is good facial symmetry. Mild facial hypomimia when distracted with other tasks.  When distracted with other tasks, his lips will part.   extraocular muscles are intact. The visual fields are full to confrontational testing. The speech is fluent and clear. Soft palate rises symmetrically and there is no tongue deviation. Hearing is intact to conversational tone. Sensation: Sensation is intact to light and pinprick throughout (facial, trunk, extremities). Vibration is decreased distally.   Motor: Strength is 5/5 in the bilateral upper and lower extremities.   Shoulder shrug is equal and symmetric.  There is no pronator drift. Deep tendon reflexes: Deep tendon reflexes are 0-1/4 at the bilateral biceps, triceps, brachioradialis, patella and achilles. Plantar responses are downgoing bilaterally.  Movement examination: Tone: Normal tone in the upper and lower extremities today. Abnormal movements: No rest tremor today. Coordination:  There is mild decremation with RAM's, with hand opening and closing Gait and Station: The patient pushes off to rise.  He does not shuffle.  He uses his walking stick and is just slightly wide-based. I have reviewed and interpreted the following labs independently   Chemistry      Component Value Date/Time   NA 142 02/03/2021 1131   K 3.9 02/03/2021 1131   CL 108 02/03/2021 1131   CO2 26 02/03/2021 1131   BUN 35 (H) 02/03/2021 1131   CREATININE 1.62 (H) 02/03/2021 1131      Component Value Date/Time   CALCIUM 8.6 (L) 02/03/2021 1131   ALKPHOS 69 02/03/2021 1131   AST 16 02/03/2021 1131   ALT 22 02/03/2021 1131   BILITOT 0.8 02/03/2021 1131      Lab Results  Component Value Date   TSH 2.701 02/03/2021   Lab  Results  Component Value Date   WBC 6.9 02/03/2021   HGB 12.2 (L) 02/03/2021   HCT 38.5 (L) 02/03/2021   MCV 92.1 02/03/2021   PLT 195 02/03/2021     Total time spent on today's visit was 35 minutes, including both face-to-face time and nonface-to-face time.  Time included that spent on review of records (prior notes available to me/labs/imaging if pertinent), discussing treatment and goals, answering patient's questions and coordinating care.  Cc:  Celene Squibb, MD

## 2022-12-01 ENCOUNTER — Other Ambulatory Visit: Payer: PPO

## 2022-12-01 ENCOUNTER — Ambulatory Visit: Payer: PPO | Admitting: Neurology

## 2022-12-01 ENCOUNTER — Encounter: Payer: Self-pay | Admitting: Neurology

## 2022-12-01 ENCOUNTER — Other Ambulatory Visit: Payer: Self-pay

## 2022-12-01 VITALS — BP 177/91 | HR 91 | Ht 73.0 in | Wt 214.2 lb

## 2022-12-01 DIAGNOSIS — G609 Hereditary and idiopathic neuropathy, unspecified: Secondary | ICD-10-CM

## 2022-12-01 DIAGNOSIS — Z5181 Encounter for therapeutic drug level monitoring: Secondary | ICD-10-CM

## 2022-12-01 DIAGNOSIS — R739 Hyperglycemia, unspecified: Secondary | ICD-10-CM

## 2022-12-01 DIAGNOSIS — E538 Deficiency of other specified B group vitamins: Secondary | ICD-10-CM | POA: Diagnosis not present

## 2022-12-01 DIAGNOSIS — R2681 Unsteadiness on feet: Secondary | ICD-10-CM | POA: Diagnosis not present

## 2022-12-01 DIAGNOSIS — R413 Other amnesia: Secondary | ICD-10-CM

## 2022-12-01 LAB — HEMOGLOBIN A1C: Hgb A1c MFr Bld: 6.3 % (ref 4.6–6.5)

## 2022-12-01 LAB — B12 AND FOLATE PANEL
Folate: >23.8 ng/mL (ref >5.9)
Vitamin B-12: 321 pg/mL (ref 211–911)

## 2022-12-01 NOTE — Patient Instructions (Addendum)
Your provider has requested that you have labwork completed today. The lab is located on the Second floor at [place], within the [facility] Endocrin[facility] office. When you get off the elevator, turn right and go in the [facility] [address]; the first brown door on the left.  Tell the ladies behind the desk that you are there for lab work. If you are not called within 15 minutes please check with the front desk.   Once you complete your labs you are free to go. You will receive a call or message via MyChart with your lab results.    We also discussed that physical therapy could help your balance.  Let us know if you would like a referral for this.  Make a follow up with Dr. Saintclair Halsted.

## 2022-12-04 LAB — PROTEIN ELECTROPHORESIS, URINE REFLEX
Albumin ELP, Urine: 67.5 %
Alpha-1-Globulin, U: 0.8 %
Alpha-2-Globulin, U: 4.9 %
Beta Globulin, U: 13.1 %
Gamma Globulin, U: 13.7 %
Protein, Ur: 165.9 mg/dL

## 2022-12-04 LAB — IMMUNOFIXATION ELECTROPHORESIS
IgG (Immunoglobin G), Serum: 1175 mg/dL (ref 600–1540)
IgM, Serum: 66 mg/dL (ref 50–300)
Immunoglobulin A: 323 mg/dL — ABNORMAL HIGH (ref 70–320)

## 2022-12-04 LAB — PROTEIN ELECTROPHORESIS, SERUM
Albumin ELP: 3.7 g/dL — ABNORMAL LOW (ref 3.8–4.8)
Alpha 1: 0.3 g/dL (ref 0.2–0.3)
Alpha 2: 0.8 g/dL (ref 0.5–0.9)
Beta 2: 0.5 g/dL (ref 0.2–0.5)
Beta Globulin: 0.4 g/dL (ref 0.4–0.6)
Gamma Globulin: 1.1 g/dL (ref 0.8–1.7)
Total Protein: 6.8 g/dL (ref 6.1–8.1)

## 2022-12-10 ENCOUNTER — Telehealth: Payer: Self-pay | Admitting: Neurology

## 2022-12-10 NOTE — Telephone Encounter (Signed)
I already called patient with results to reach out to PCP about being pre diabetic and to start b-12 . Do you have any additional testing that came through on MY chart that he is talking about?

## 2022-12-10 NOTE — Telephone Encounter (Signed)
Talked with patient and made him aware of Dr. Arturo Morton recommendations and if he wanted to do a EMG to please let us know

## 2022-12-10 NOTE — Telephone Encounter (Signed)
Left patient message

## 2022-12-10 NOTE — Telephone Encounter (Signed)
Pt called in wanting to get his lab results. He is eager to find out what he should do to remedy his situation.

## 2022-12-14 DIAGNOSIS — H401133 Primary open-angle glaucoma, bilateral, severe stage: Secondary | ICD-10-CM | POA: Diagnosis not present

## 2022-12-24 DIAGNOSIS — N200 Calculus of kidney: Secondary | ICD-10-CM | POA: Diagnosis not present

## 2022-12-24 DIAGNOSIS — R310 Gross hematuria: Secondary | ICD-10-CM | POA: Diagnosis not present

## 2022-12-29 DIAGNOSIS — N281 Cyst of kidney, acquired: Secondary | ICD-10-CM | POA: Diagnosis not present

## 2022-12-29 DIAGNOSIS — N312 Flaccid neuropathic bladder, not elsewhere classified: Secondary | ICD-10-CM | POA: Diagnosis not present

## 2022-12-29 DIAGNOSIS — D414 Neoplasm of uncertain behavior of bladder: Secondary | ICD-10-CM | POA: Diagnosis not present

## 2022-12-29 DIAGNOSIS — R3914 Feeling of incomplete bladder emptying: Secondary | ICD-10-CM | POA: Diagnosis not present

## 2022-12-29 DIAGNOSIS — N200 Calculus of kidney: Secondary | ICD-10-CM | POA: Diagnosis not present

## 2022-12-29 DIAGNOSIS — R310 Gross hematuria: Secondary | ICD-10-CM | POA: Diagnosis not present

## 2022-12-30 ENCOUNTER — Other Ambulatory Visit: Payer: Self-pay | Admitting: Urology

## 2023-01-05 ENCOUNTER — Other Ambulatory Visit: Payer: Self-pay | Admitting: Neurosurgery

## 2023-01-05 DIAGNOSIS — R2689 Other abnormalities of gait and mobility: Secondary | ICD-10-CM

## 2023-01-05 DIAGNOSIS — G629 Polyneuropathy, unspecified: Secondary | ICD-10-CM | POA: Diagnosis not present

## 2023-01-13 NOTE — Patient Instructions (Signed)
SURGICAL WAITING ROOM VISITATION Patients having surgery or a procedure may have no more than 2 support people in the waiting area - these visitors may rotate.    If the patient needs to stay at the hospital during part of their recovery, the visitor guidelines for inpatient rooms apply. Pre-op nurse will coordinate an appropriate time for 1 support person to accompany patient in pre-op.  This support person may not rotate.    Please refer to the [facility] website for the visitor guidelines for Inpatients (after your surgery is over and you are in a regular room).   Due to an increase in RSV and influenza rates and associated hospitalizations, children ages 4 and under may not visit patients in [place].     Your procedure is scheduled on:    Report to [facility] Main Entrance    Report to admitting at AM   Call this number if you have problems the morning of surgery [phone]   Do not eat food :After Midnight.   After Midnight you may have the following liquids until AM/ PM DAY OF SURGERY  Water Non-Citrus Juices (without pulp, NO RED) Carbonated Beverages Black Coffee (NO MILK/CREAM OR CREAMERS, sugar ok)  Clear Tea (NO MILK/CREAM OR CREAMERS, sugar ok) regular and decaf                             Plain Jell-O (NO RED)                                           Fruit ices (not with fruit pulp, NO RED)                                     Popsicles (NO RED)                                                               Sports drinks like Gatorade (NO RED)                   The day of surgery:  Drink ONE (1) Pre-Surgery Clear Ensure or G2 at AM the morning of surgery. Drink in one sitting. Do not sip.  This drink was given to you during your hospital  pre-op appointment visit. Nothing else to drink after completing the Pre-Surgery Clear Ensure or G2.          If you have questions, please contact your surgeon's office.   FOLLOW BOWEL PREP AND ANY  ADDITIONAL PRE OP INSTRUCTIONS YOU RECEIVED FROM YOUR SURGEON'S OFFICE!!!     Oral Hygiene is also important to reduce your risk of infection.                                    Remember - BRUSH YOUR TEETH THE MORNING OF SURGERY WITH YOUR REGULAR TOOTHPASTE   Do NOT smoke after Midnight   Take these medicines the morning of surgery with A SIP  OF WATER:   DO NOT TAKE ANY ORAL DIABETIC MEDICATIONS DAY OF YOUR SURGERY  Bring CPAP mask and tubing day of surgery.                              You may not have any metal on your body including hair pins, jewelry, and body piercing             Do not wear make-up, lotions, powders, perfumes/cologne, or deodorant  Do not wear nail polish including gel and S&S, artificial/acrylic nails, or any other type of covering on natural nails including finger and toenails. If you have artificial nails, gel coating, etc. that needs to be removed by a nail salon please have this removed prior to surgery or surgery may need to be canceled/ delayed if the surgeon/ anesthesia feels like they are unable to be safely monitored.   Do not shave  48 hours prior to surgery.               Men may shave face and neck.   Do not bring valuables to the hospital. [place].   Contacts, dentures or bridgework may not be worn into surgery.   Bring small overnight bag day of surgery.   DO NOT [place]. PHARMACY WILL DISPENSE MEDICATIONS LISTED ON YOUR MEDICATION LIST TO YOU DURING YOUR ADMISSION [place]!    Patients discharged on the day of surgery will not be allowed to drive home.  Someone NEEDS to stay with you for the first 24 hours after anesthesia.   Special Instructions: Bring a copy of your healthcare power of attorney and living will documents the day of surgery if you haven't scanned them before.              Please read over the following fact sheets you were given: IF [place] [phone]   If you received a COVID test during your pre-op visit  it is requested that you wear a mask when out in public, stay away from anyone that may not be feeling well and notify your surgeon if you develop symptoms. If you test positive for Covid or have been in contact with anyone that has tested positive in the last 10 days please notify you surgeon.  [place] - Preparing for Surgery Before surgery, you can play an important role.  Because skin is not sterile, your skin needs to be as free of germs as possible.  You can reduce the number of germs on your skin by washing with CHG (chlorahexidine gluconate) soap before surgery.  CHG is an antiseptic cleaner which kills germs and bonds with the skin to continue killing germs even after washing. Please DO NOT use if you have an allergy to CHG or antibacterial soaps.  If your skin becomes reddened/irritated stop using the CHG and inform your nurse when you arrive at Short Stay. Do not shave (including legs and underarms) for at least 48 hours prior to the first CHG shower.  You may shave your face/neck.  Please follow these instructions carefully:  1.  Shower with CHG Soap the night before surgery and the  morning of surgery.  2.  If you choose to wash your hair, wash your hair first as usual with your normal  shampoo.  3.  After you  shampoo, rinse your hair and body thoroughly to remove the shampoo.                             4.  Use CHG as you would any other liquid soap.  You can apply chg directly to the skin and wash.  Gently with a scrungie or clean washcloth.  5.  Apply the CHG Soap to your body ONLY FROM THE NECK DOWN.   Do   not use on face/ open                           Wound or open sores. Avoid contact with eyes, ears mouth and   genitals (private parts).                       Wash face,  Genitals (private parts) with your normal soap.             6.  Wash thoroughly,  paying special attention to the area where your    surgery  will be performed.  7.  Thoroughly rinse your body with warm water from the neck down.  8.  DO NOT shower/wash with your normal soap after using and rinsing off the CHG Soap.                9.  Pat yourself dry with a clean towel.            10.  Wear clean pajamas.            11.  Place clean sheets on your bed the night of your first shower and do not  sleep with pets. Day of Surgery : Do not apply any lotions/deodorants the morning of surgery.  Please wear clean clothes to the hospital/surgery center.  FAILURE TO FOLLOW THESE INSTRUCTIONS MAY RESULT IN THE CANCELLATION OF YOUR SURGERY  PATIENT SIGNATURE_________________________________  NURSE SIGNATURE__________________________________  ________________________________________________________________________

## 2023-01-13 NOTE — Progress Notes (Signed)
COVID Vaccine Completed:  Date of COVID positive in last 90 days:  PCP - Allyn Kenner, MD Cardiologist - [name], MD  Chest x-ray -  EKG -  Stress Test -  ECHO -  Cardiac Cath -  Pacemaker/ICD device last checked: Spinal Cord Stimulator:  Bowel Prep -   Sleep Study -  CPAP -   Fasting Blood Sugar -  Checks Blood Sugar _____ times a day  Last dose of GLP1 agonist-  N/A GLP1 instructions:  N/A   Last dose of SGLT-2 inhibitors-  N/A SGLT-2 instructions: N/A   Blood Thinner Instructions: Aspirin Instructions: Last Dose:  Activity level:  Can go up a flight of stairs and perform activities of daily living without stopping and without symptoms of chest pain or shortness of breath.  Able to exercise without symptoms  Unable to go up a flight of stairs without symptoms of     Anesthesia review:   Patient denies shortness of breath, fever, cough and chest pain at PAT appointment  Patient verbalized understanding of instructions that were given to them at the PAT appointment. Patient was also instructed that they will need to review over the PAT instructions again at home before surgery.

## 2023-01-14 ENCOUNTER — Encounter (HOSPITAL_COMMUNITY)
Admission: RE | Admit: 2023-01-14 | Discharge: 2023-01-14 | Disposition: A | Discharge status: Home / Self Care | Payer: PPO | Source: Ambulatory Visit | Attending: Internal Medicine | Admitting: Internal Medicine

## 2023-01-15 ENCOUNTER — Encounter (HOSPITAL_COMMUNITY): Payer: Self-pay

## 2023-01-15 NOTE — Progress Notes (Addendum)
COVID Vaccine Completed:  Yes  Date of COVID positive in last 90 days: November 2023  PCP - Allyn Kenner, MD Cardiologist - [name], MD (last OV 2022 for syncope) Nephrologist - Manpreet Bhutani ([facility]) Neurologist - Wells Guiles Tat, DO (gait instability)  Chest x-ray - 11-13-22 Epic EKG - 01-18-23 Epic Stress Test - 03-14-21 Epic ECHO - 02-20-21 Epic Cardiac Cath - N/A Pacemaker/ICD device last checked: Spinal Cord Stimulator:  N/A Non-telemetry monitor - 2022 Cardiac event monitor - 01-27-21  Bowel Prep - N/A  Sleep Study - Yes, +sleep apnea CPAP - Yes  Prediabetes Fasting Blood Sugar -  Checks Blood Sugar - does not check   Last dose of GLP1 agonist-  N/A GLP1 instructions:  N/A   Last dose of SGLT-2 inhibitors-  N/A SGLT-2 instructions: N/A  Blood Thinner Instructions:  N/A Aspirin Instructions: Last Dose:  Activity level:  Can go up a flight of stairs and perform activities of daily living without stopping and without symptoms of chest pain or shortness of breath.  Anesthesia review:  Syncope evaluated by cardiology.  ?Parkinson's disorder vs gait disorder.   No recent fainting spells.  HTN, OSA, preDM. RBBB  Creatinine 1.75 on PAT labs  Patient denies shortness of breath, fever, cough and chest pain at PAT appointment  Patient verbalized understanding of instructions that were given to them at the PAT appointment. Patient was also instructed that they will need to review over the PAT instructions again at home before surgery.

## 2023-01-15 NOTE — Patient Instructions (Addendum)
SURGICAL WAITING ROOM VISITATION Patients having surgery or a procedure may have no more than 2 support people in the waiting area - these visitors may rotate.    If the patient needs to stay at the hospital during part of their recovery, the visitor guidelines for inpatient rooms apply. Pre-op nurse will coordinate an appropriate time for 1 support person to accompany patient in pre-op.  This support person may not rotate.    Please refer to the [facility] website for the visitor guidelines for Inpatients (after your surgery is over and you are in a regular room).   Due to an increase in RSV and influenza rates and associated hospitalizations, children ages 65 and under may not visit patients in [place].     Your procedure is scheduled on: 01-25-23   Report to [facility] Main Entrance    Report to admitting at 5:15 AM   Call this number if you have problems the morning of surgery [phone]   Do not eat food or drink liquids :After Midnight.           If you have questions, please contact your surgeon's office.   FOLLOW ANY ADDITIONAL PRE OP INSTRUCTIONS YOU RECEIVED FROM YOUR SURGEON'S OFFICE!!!     Oral Hygiene is also important to reduce your risk of infection.                                    Remember - BRUSH YOUR TEETH THE MORNING OF SURGERY WITH YOUR REGULAR TOOTHPASTE   Do NOT smoke after Midnight   Take these medicines the morning of surgery with A SIP OF WATER:   Carvedilol  Duloxetine  Tamsulosin  Tylenol if needed  Okay to use inhalers and eydrops  Bring CPAP mask and tubing day of surgery.                              You may not have any metal on your body including  jewelry, and body piercing             Do not wear lotions, powders, cologne, or deodorant              Men may shave face and neck.   Do not bring valuables to the hospital. [place].   Contacts, dentures or bridgework may  not be worn into surgery.  DO NOT [place]. PHARMACY WILL DISPENSE MEDICATIONS LISTED ON YOUR MEDICATION LIST TO YOU DURING YOUR ADMISSION [place]!    Patients discharged on the day of surgery will not be allowed to drive home.  Someone NEEDS to stay with you for the first 24 hours after anesthesia.                Please read over the following fact sheets you were given: IF [place]  If you received a COVID test during your pre-op visit  it is requested that you wear a mask when out in public, stay away from anyone that may not be feeling well and notify your surgeon if you develop symptoms. If you test positive for Covid or have been in contact with anyone that has tested positive in the last 10 days please notify  you surgeon.  [place] - Preparing for Surgery Before surgery, you can play an important role.  Because skin is not sterile, your skin needs to be as free of germs as possible.  You can reduce the number of germs on your skin by washing with CHG (chlorahexidine gluconate) soap before surgery.  CHG is an antiseptic cleaner which kills germs and bonds with the skin to continue killing germs even after washing. Please DO NOT use if you have an allergy to CHG or antibacterial soaps.  If your skin becomes reddened/irritated stop using the CHG and inform your nurse when you arrive at Short Stay. Do not shave (including legs and underarms) for at least 48 hours prior to the first CHG shower.  You may shave your face/neck.  Please follow these instructions carefully:  1.  Shower with CHG Soap the night before surgery and the  morning of surgery.  2.  If you choose to wash your hair, wash your hair first as usual with your normal  shampoo.  3.  After you shampoo, rinse your hair and body thoroughly to remove the shampoo.                             4.  Use CHG as you would any  other liquid soap.  You can apply chg directly to the skin and wash.  Gently with a scrungie or clean washcloth.  5.  Apply the CHG Soap to your body ONLY FROM THE NECK DOWN.   Do   not use on face/ open                           Wound or open sores. Avoid contact with eyes, ears mouth and   genitals (private parts).                       Wash face,  Genitals (private parts) with your normal soap.             6.  Wash thoroughly, paying special attention to the area where your    surgery  will be performed.  7.  Thoroughly rinse your body with warm water from the neck down.  8.  DO NOT shower/wash with your normal soap after using and rinsing off the CHG Soap.                9.  Pat yourself dry with a clean towel.            10.  Wear clean pajamas.            11.  Place clean sheets on your bed the night of your first shower and do not  sleep with pets. Day of Surgery : Do not apply any lotions/deodorants the morning of surgery.  Please wear clean clothes to the hospital/surgery center.  FAILURE TO FOLLOW THESE INSTRUCTIONS MAY RESULT IN THE CANCELLATION OF YOUR SURGERY  PATIENT SIGNATURE_________________________________  NURSE SIGNATURE__________________________________  ________________________________________________________________________

## 2023-01-18 ENCOUNTER — Encounter (HOSPITAL_COMMUNITY)
Admission: RE | Admit: 2023-01-18 | Discharge: 2023-01-18 | Disposition: A | Discharge status: Home / Self Care | Payer: PPO | Source: Ambulatory Visit | Attending: Urology | Admitting: Urology

## 2023-01-18 ENCOUNTER — Other Ambulatory Visit: Payer: Self-pay

## 2023-01-18 ENCOUNTER — Encounter (HOSPITAL_COMMUNITY): Payer: Self-pay

## 2023-01-18 VITALS — BP 150/78 | HR 60 | Temp 97.8°F | Resp 16 | Ht 73.0 in | Wt 215.4 lb

## 2023-01-18 DIAGNOSIS — I4510 Unspecified right bundle-branch block: Secondary | ICD-10-CM | POA: Diagnosis not present

## 2023-01-18 DIAGNOSIS — Z01818 Encounter for other preprocedural examination: Secondary | ICD-10-CM | POA: Insufficient documentation

## 2023-01-18 DIAGNOSIS — R7303 Prediabetes: Secondary | ICD-10-CM

## 2023-01-18 DIAGNOSIS — I491 Atrial premature depolarization: Secondary | ICD-10-CM | POA: Diagnosis not present

## 2023-01-18 DIAGNOSIS — I2510 Atherosclerotic heart disease of native coronary artery without angina pectoris: Secondary | ICD-10-CM | POA: Diagnosis not present

## 2023-01-18 HISTORY — DX: Personal history of urinary calculi: Z87.442

## 2023-01-18 HISTORY — DX: Prediabetes: R73.03

## 2023-01-18 LAB — BASIC METABOLIC PANEL
Anion gap: 7 (ref 5–15)
BUN: 27 mg/dL — ABNORMAL HIGH (ref 8–23)
CO2: 28 mmol/L (ref 22–32)
Calcium: 8.5 mg/dL — ABNORMAL LOW (ref 8.9–10.3)
Chloride: 110 mmol/L (ref 98–111)
Creatinine, Ser: 1.75 mg/dL — ABNORMAL HIGH (ref 0.61–1.24)
GFR, Estimated: 40 mL/min — ABNORMAL LOW (ref >60)
Glucose, Bld: 83 mg/dL (ref 70–99)
Potassium: 3.9 mmol/L (ref 3.5–5.1)
Sodium: 145 mmol/L (ref 135–145)

## 2023-01-18 LAB — GLUCOSE, CAPILLARY: Glucose-Capillary: 108 mg/dL — ABNORMAL HIGH (ref 70–99)

## 2023-01-20 ENCOUNTER — Ambulatory Visit
Admission: RE | Admit: 2023-01-20 | Discharge: 2023-01-20 | Disposition: A | Discharge status: Home / Self Care | Payer: PPO | Source: Ambulatory Visit | Attending: Neurosurgery | Admitting: Neurosurgery

## 2023-01-20 DIAGNOSIS — R2689 Other abnormalities of gait and mobility: Secondary | ICD-10-CM

## 2023-01-20 DIAGNOSIS — M40204 Unspecified kyphosis, thoracic region: Secondary | ICD-10-CM | POA: Diagnosis not present

## 2023-01-20 DIAGNOSIS — M4802 Spinal stenosis, cervical region: Secondary | ICD-10-CM | POA: Diagnosis not present

## 2023-01-25 ENCOUNTER — Ambulatory Visit (HOSPITAL_BASED_OUTPATIENT_CLINIC_OR_DEPARTMENT_OTHER): Payer: PPO

## 2023-01-25 ENCOUNTER — Ambulatory Visit (HOSPITAL_COMMUNITY)
Admission: RE | Admit: 2023-01-25 | Discharge: 2023-01-25 | Disposition: A | Discharge status: Home / Self Care | Payer: PPO | Source: Ambulatory Visit | Attending: Urology | Admitting: Urology

## 2023-01-25 ENCOUNTER — Encounter (HOSPITAL_COMMUNITY): Payer: Self-pay | Admitting: Urology

## 2023-01-25 ENCOUNTER — Encounter (HOSPITAL_COMMUNITY)
Admission: RE | Disposition: A | Discharge status: Home / Self Care | Payer: Self-pay | Source: Ambulatory Visit | Attending: Urology

## 2023-01-25 ENCOUNTER — Ambulatory Visit (HOSPITAL_COMMUNITY): Payer: PPO | Admitting: Physician Assistant

## 2023-01-25 DIAGNOSIS — C672 Malignant neoplasm of lateral wall of bladder: Secondary | ICD-10-CM | POA: Insufficient documentation

## 2023-01-25 DIAGNOSIS — Z87442 Personal history of urinary calculi: Secondary | ICD-10-CM | POA: Diagnosis not present

## 2023-01-25 DIAGNOSIS — G4730 Sleep apnea, unspecified: Secondary | ICD-10-CM | POA: Diagnosis not present

## 2023-01-25 DIAGNOSIS — G4733 Obstructive sleep apnea (adult) (pediatric): Secondary | ICD-10-CM | POA: Diagnosis not present

## 2023-01-25 DIAGNOSIS — F32A Depression, unspecified: Secondary | ICD-10-CM | POA: Diagnosis not present

## 2023-01-25 DIAGNOSIS — Z87891 Personal history of nicotine dependence: Secondary | ICD-10-CM | POA: Insufficient documentation

## 2023-01-25 DIAGNOSIS — R519 Headache, unspecified: Secondary | ICD-10-CM | POA: Diagnosis not present

## 2023-01-25 DIAGNOSIS — M1990 Unspecified osteoarthritis, unspecified site: Secondary | ICD-10-CM | POA: Diagnosis not present

## 2023-01-25 DIAGNOSIS — Z9989 Dependence on other enabling machines and devices: Secondary | ICD-10-CM | POA: Diagnosis not present

## 2023-01-25 DIAGNOSIS — R338 Other retention of urine: Secondary | ICD-10-CM | POA: Diagnosis not present

## 2023-01-25 DIAGNOSIS — N3941 Urge incontinence: Secondary | ICD-10-CM | POA: Diagnosis not present

## 2023-01-25 DIAGNOSIS — I10 Essential (primary) hypertension: Secondary | ICD-10-CM | POA: Diagnosis not present

## 2023-01-25 DIAGNOSIS — D494 Neoplasm of unspecified behavior of bladder: Secondary | ICD-10-CM

## 2023-01-25 DIAGNOSIS — R7303 Prediabetes: Secondary | ICD-10-CM

## 2023-01-25 DIAGNOSIS — F419 Anxiety disorder, unspecified: Secondary | ICD-10-CM | POA: Insufficient documentation

## 2023-01-25 DIAGNOSIS — D090 Carcinoma in situ of bladder: Secondary | ICD-10-CM | POA: Diagnosis not present

## 2023-01-25 DIAGNOSIS — R310 Gross hematuria: Secondary | ICD-10-CM | POA: Diagnosis not present

## 2023-01-25 DIAGNOSIS — F418 Other specified anxiety disorders: Secondary | ICD-10-CM | POA: Diagnosis not present

## 2023-01-25 DIAGNOSIS — I2510 Atherosclerotic heart disease of native coronary artery without angina pectoris: Secondary | ICD-10-CM

## 2023-01-25 HISTORY — PX: TRANSURETHRAL RESECTION OF BLADDER TUMOR WITH MITOMYCIN-C: SHX6459

## 2023-01-25 LAB — GLUCOSE, CAPILLARY: Glucose-Capillary: 95 mg/dL (ref 70–99)

## 2023-01-25 SURGERY — TRANSURETHRAL RESECTION OF BLADDER TUMOR WITH MITOMYCIN-C
Anesthesia: General

## 2023-01-25 MED ORDER — GEMCITABINE CHEMO FOR BLADDER INSTILLATION 2000 MG
2000.0000 mg | Freq: Once | INTRAVENOUS | Status: DC
  Filled 2023-01-25: qty 52.6

## 2023-01-25 MED ORDER — SODIUM CHLORIDE 0.9 % IR SOLN
Status: DC | PRN
  Administered 2023-01-25 (×2): 3000 mL via INTRAVESICAL

## 2023-01-25 MED ORDER — ONDANSETRON HCL 4 MG/2ML IJ SOLN
INTRAMUSCULAR | Status: DC | PRN
  Administered 2023-01-25: 4 mg via INTRAVENOUS

## 2023-01-25 MED ORDER — GEMCITABINE CHEMO FOR BLADDER INSTILLATION 2000 MG: 2000.0000 mg | Freq: Once | INTRAVENOUS | Status: DC

## 2023-01-25 MED ORDER — ACETAMINOPHEN 500 MG PO TABS
1000.0000 mg | ORAL_TABLET | Freq: Once | ORAL | Status: AC
  Administered 2023-01-25: 1000 mg via ORAL
  Filled 2023-01-25: qty 2

## 2023-01-25 MED ORDER — PROPOFOL 10 MG/ML IV BOLUS
INTRAVENOUS | Status: AC
  Filled 2023-01-25: qty 20

## 2023-01-25 MED ORDER — ROCURONIUM BROMIDE 100 MG/10ML IV SOLN
INTRAVENOUS | Status: DC | PRN
  Administered 2023-01-25: 50 mg via INTRAVENOUS

## 2023-01-25 MED ORDER — CHLORHEXIDINE GLUCONATE 0.12 % MT SOLN
15.0000 mL | Freq: Once | OROMUCOSAL | Status: AC
  Administered 2023-01-25: 15 mL via OROMUCOSAL

## 2023-01-25 MED ORDER — ONDANSETRON HCL 4 MG/2ML IJ SOLN: 4.0000 mg | Freq: Once | INTRAMUSCULAR | Status: DC | PRN

## 2023-01-25 MED ORDER — PHENYLEPHRINE HCL (PRESSORS) 10 MG/ML IV SOLN
INTRAVENOUS | Status: DC | PRN
  Administered 2023-01-25 (×4): 80 ug via INTRAVENOUS

## 2023-01-25 MED ORDER — CARVEDILOL 3.125 MG PO TABS
3.1250 mg | ORAL_TABLET | Freq: Once | ORAL | Status: AC
  Administered 2023-01-25: 3.125 mg via ORAL
  Filled 2023-01-25: qty 1

## 2023-01-25 MED ORDER — FENTANYL CITRATE (PF) 100 MCG/2ML IJ SOLN
INTRAMUSCULAR | Status: DC | PRN
  Administered 2023-01-25: 50 ug via INTRAVENOUS

## 2023-01-25 MED ORDER — CEFAZOLIN SODIUM-DEXTROSE 2-4 GM/100ML-% IV SOLN
2.0000 g | INTRAVENOUS | Status: AC
  Administered 2023-01-25: 2 g via INTRAVENOUS
  Filled 2023-01-25: qty 100

## 2023-01-25 MED ORDER — PROPOFOL 10 MG/ML IV BOLUS
INTRAVENOUS | Status: DC | PRN
  Administered 2023-01-25: 150 mg via INTRAVENOUS

## 2023-01-25 MED ORDER — LIDOCAINE HCL (CARDIAC) PF 100 MG/5ML IV SOSY
PREFILLED_SYRINGE | INTRAVENOUS | Status: DC | PRN
  Administered 2023-01-25: 60 mg via INTRAVENOUS

## 2023-01-25 MED ORDER — SUGAMMADEX SODIUM 200 MG/2ML IV SOLN
INTRAVENOUS | Status: DC | PRN
  Administered 2023-01-25: 200 mg via INTRAVENOUS

## 2023-01-25 MED ORDER — FENTANYL CITRATE (PF) 100 MCG/2ML IJ SOLN
INTRAMUSCULAR | Status: AC
  Filled 2023-01-25: qty 2

## 2023-01-25 MED ORDER — FENTANYL CITRATE PF 50 MCG/ML IJ SOSY
25.0000 ug | PREFILLED_SYRINGE | INTRAMUSCULAR | Status: DC | PRN
  Administered 2023-01-25 (×2): 25 ug via INTRAVENOUS

## 2023-01-25 MED ORDER — FENTANYL CITRATE PF 50 MCG/ML IJ SOSY
PREFILLED_SYRINGE | INTRAMUSCULAR | Status: AC
  Filled 2023-01-25: qty 1

## 2023-01-25 MED ORDER — DEXAMETHASONE SODIUM PHOSPHATE 10 MG/ML IJ SOLN
INTRAMUSCULAR | Status: DC | PRN
  Administered 2023-01-25: 5 mg via INTRAVENOUS

## 2023-01-25 MED ORDER — MIDAZOLAM HCL 2 MG/2ML IJ SOLN
INTRAMUSCULAR | Status: AC
  Filled 2023-01-25: qty 2

## 2023-01-25 MED ORDER — AMISULPRIDE (ANTIEMETIC) 5 MG/2ML IV SOLN: 10.0000 mg | Freq: Once | INTRAVENOUS | Status: DC | PRN

## 2023-01-25 MED ORDER — HYDROCODONE-ACETAMINOPHEN 5-325 MG PO TABS: 1.0000 | ORAL_TABLET | ORAL | 0 refills | Status: DC | PRN

## 2023-01-25 MED ORDER — MIDAZOLAM HCL 5 MG/5ML IJ SOLN
INTRAMUSCULAR | Status: DC | PRN
  Administered 2023-01-25: 1 mg via INTRAVENOUS

## 2023-01-25 MED ORDER — LACTATED RINGERS IV SOLN: INTRAVENOUS | Status: DC

## 2023-01-25 MED ORDER — 0.9 % SODIUM CHLORIDE (POUR BTL) OPTIME
TOPICAL | Status: DC | PRN
  Administered 2023-01-25: 1000 mL

## 2023-01-25 MED ORDER — ORAL CARE MOUTH RINSE: 15.0000 mL | Freq: Once | OROMUCOSAL | Status: AC

## 2023-01-25 MED ORDER — ROCURONIUM BROMIDE 10 MG/ML (PF) SYRINGE
PREFILLED_SYRINGE | INTRAVENOUS | Status: AC
  Filled 2023-01-25: qty 10

## 2023-01-25 SURGICAL SUPPLY — 19 items
BAG DRN RND TRDRP ANRFLXCHMBR (UROLOGICAL SUPPLIES)
BAG URINE DRAIN 2000ML AR STRL (UROLOGICAL SUPPLIES) IMPLANT
BAG URO CATCHER STRL LF (MISCELLANEOUS) ×1 IMPLANT
CATH FOLEY 2WAY SLVR  5CC 18FR (CATHETERS) ×1
CATH FOLEY 2WAY SLVR 5CC 18FR (CATHETERS) IMPLANT
DRAPE FOOT SWITCH (DRAPES) ×1 IMPLANT
ELECT REM PT RETURN 15FT ADLT (MISCELLANEOUS) ×1 IMPLANT
GLOVE BIO SURGEON STRL SZ7.5 (GLOVE) ×1 IMPLANT
GOWN STRL REUS W/ TWL XL LVL3 (GOWN DISPOSABLE) ×1 IMPLANT
GOWN STRL REUS W/TWL XL LVL3 (GOWN DISPOSABLE) ×1
KIT TURNOVER KIT A (KITS) IMPLANT
LOOP CUT BIPOLAR 24F LRG (ELECTROSURGICAL) IMPLANT
MANIFOLD NEPTUNE II (INSTRUMENTS) ×1 IMPLANT
PACK CYSTO (CUSTOM PROCEDURE TRAY) ×1 IMPLANT
PLUG CATH AND CAP STER (CATHETERS) IMPLANT
SYR TOOMEY IRRIG 70ML (MISCELLANEOUS)
SYRINGE TOOMEY IRRIG 70ML (MISCELLANEOUS) IMPLANT
TUBING CONNECTING 10 (TUBING) ×1 IMPLANT
TUBING UROLOGY SET (TUBING) ×1 IMPLANT

## 2023-01-25 NOTE — H&P (Signed)
CC/HPI: [name] was added on for frequency and incontinence. He is status post cystoscopy with right retrograde pyelogram, right ureteroscopy laser lithotripsy right ureteral biopsy and stent placement 04/22/2022 with Dr. Gloriann [name]. A large capacity bladder was noted. I reviewed his 04/16/2022 CT which did not show significant BPH. Bladder large capacity and moderately distended.   He complains of frequency and urgency. No gross hematuria. No fever. No dysuria. He has incontinence with urgency. No pads or diapers. He does have dry periods. He is not continually wet. Postvoid today is 763. He has a history of incomplete bladder emptying and was on tamsulosin. He stopped tamsulosin p the URS. His post voids are typically in the 300s. No pain and no painful inability to void. Stream OK.   UA looks good and typical for a stent - rare bacteria and some rbcs. He is not clinically infected.   05/04/2022  Patient status post right ureteroscopy with laser lithotripsy of ureteral calculi. 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 This was biopsied. Biopsy results were negative. Overall doing well. He was seen for frequency and urgency. He was otherwise not symptomatic. Has a large capacity bladder. He declined catheter placement or teaching for clean intermittent catheterization.   12/24/2022  Patient comes in with a 2-day history of painless gross hematuria. No flank pain or dysuria. Has a history of bladder cancer that was treated by Dr. Gaynelle [name]. Last cystoscopy was in May when he had ureteroscopy.   12/29/2022  Patient underwent CT hematuria protocol. Had some small renal calculi bilaterally but no ureteral calculi. Some renal atrophy bilaterally consistent with chronic renal disease. No obvious bladder tumors. Bladder was distended. He has a known large capacity bladder. Not very symptomatic in this regard. He has declined catheter or CIC in  the past. Urine culture had strep viridans but he is asymptomatic without any dysuria or pelvic pain/pressure.     ALLERGIES: Sulfa Drugs    MEDICATIONS: Aspirin  Tamsulosin Hcl 0.4 mg capsule  Carvedilol 3.125 mg tablet  Duloxetine Hcl 60 mg capsule,delayed release  Pravastatin Sodium 10 mg tablet  Sodium Bicarbonate 650 mg tablet  Tylenol  Valsartan 40 mg tablet  Vitamin B12     GU PSH: Cysto Remove Stent FB Sim - 05/04/2022, 2021 Cysto Uretero Biopsy Fulgura - 04/22/2022 Cystoscopy - 2020, 2017 Cystoscopy Insert Stent, Bilateral - 2021 Cystoscopy TURBT <2 cm - 2016 Cystoscopy TURBT 2-5 cm - 2009 Ureteroscopic laser litho - 04/22/2022, Bilateral - 2021       PSH Notes: Cystoscopy With Fulguration Small Lesion (5-61m), Neck Surgery, Back Surgery, Cystoscopy With Fulguration Medium Lesion (2-5cm), Cystoscopy Bladder Tumor   NON-GU PSH: Eye Surgery (Unspecified), Left Knee replacement, Left     GU PMH: Gross hematuria - 12/24/2022 Renal calculus (Stable) - 12/24/2022, - 2021 Renal and ureteral calculus - 05/04/2022, (Stable), - 04/16/2022, - 2020 BPH w/LUTS, He has urgency and UUI worsening emptying p he stopped his tamsulosin. We discussed an in and out cath or a foley but he's not in any pain or distress. Incontinence not c/w malpositioned stent (dry periods and retaining urine). Disc ureteral bx benign. 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, Malignant neoplasm of bladder neck - 2017 Urinary Retention, Acute  retention of urine - 2016 Areflexic bladder, Hypotonic bladder - 2016 Bladder, Oth neuromuscular dysfunction, Frequency-urgency syndrome - 2016 Hydronephrosis Unspec, Hydronephrosis, right - 2016 Other microscopic hematuria, Microscopic hematuria - 2016 Urinary Retention, Unspec, Urinary retention - 2016, Acute Urinary Retention, - 2014 Bladder Cancer, Unspec, Stage I papillary adenocarcinoma of bladder - 2016 Urinary Tract Inf, Unspec site, Urinary tract infection - 2016 Encounter for Prostate Cancer screening, Prostate cancer screening - 2014 History of bladder cancer, Bladder Cancer - 2014 Inflammatory Disease Prostate, Unspec, Prostatitis - 2014      PMH Notes:  2006-12-23 15:00:39 - Note: Anxiety  2008-09-26 17:37:15 - Note: Epididymis Mass (___cm)   NON-GU PMH: Candidiasis, unspecified - 2020 Encounter for general adult medical examination without abnormal findings, Encounter for preventive health examination - 2017 Cardiac murmur, unspecified, Murmurs - 2014 Personal history of other diseases of the circulatory system, History of hypertension - 2014 Personal history of other diseases of the nervous system and sense organs, History of sleep apnea - 2014 Personal history of other mental and behavioral disorders, History of depression - 2014 Unspecified fracture of left wrist and hand, sequela    FAMILY HISTORY: Kidney Cancer - Runs In Family Kidney Stones - Runs In Family Prostate Cancer - Runs In Family, Runs In Family Urologic Disorder - Runs In Family   SOCIAL HISTORY: Marital Status: Married Preferred Language: English; Race: White Current Smoking Status: Patient does not smoke anymore.  Has never drank.  Does not drink caffeine.     Notes: Former smoker, Tobacco use, Occupation:, Marital History - Currently Married, Alcohol Use   REVIEW OF SYSTEMS:    GU Review Male:   Patient denies frequent urination, hard to postpone urination, burning/ pain with urination, get up  at night to urinate, leakage of urine, stream starts and stops, trouble starting your stream, have to strain to urinate , erection problems, and penile pain.  Gastrointestinal (Upper):   Patient denies nausea, vomiting, and indigestion/ heartburn.  Gastrointestinal (Lower):   Patient denies constipation and diarrhea.  Constitutional:   Patient denies fever, night sweats, weight loss, and fatigue.  Skin:   Patient denies skin rash/ lesion and itching.  Eyes:   Patient denies blurred vision and double vision.  Ears/ Nose/ Throat:   Patient denies sore throat and sinus problems.  Hematologic/Lymphatic:   Patient denies swollen glands and easy bruising.  Cardiovascular:   Patient denies leg swelling and chest pains.  Respiratory:   Patient denies cough and shortness of breath.  Endocrine:   Patient denies excessive thirst.  Musculoskeletal:   Patient denies back pain and joint pain.  Neurological:   Patient denies headaches and dizziness.  Psychologic:   Patient denies depression and anxiety.   VITAL SIGNS: None   Complexity of Data:  Source Of History:  Patient  Records Review:   Previous Doctor Records, Previous Patient Records  Urine Test Review:   Urinalysis  X-Ray Review: C.T. Abdomen/Pelvis: Reviewed Films. Reviewed Report. Discussed With Patient.     06/09/16 06/21/12  PSA  Total PSA 0.3  0.27   Free PSA 0.09    % Free PSA 30      PROCEDURES:         Flexible Cystoscopy - 52000  Risks, benefits, and some of the potential complications  of the procedure were discussed at length with the patient including infection, bleeding, voiding discomfort, urinary retention, fever, chills, sepsis, and others. All questions were answered. Informed consent was obtained. Antibiotic prophylaxis was given. Sterile technique and intraurethral analgesia were used.  Meatus:  Normal size. Normal location. Normal condition.  Urethra:  No strictures.  External Sphincter:  Normal.  Verumontanum:   Normal.  Prostate:  Obstructing. Moderate hyperplasia.  Bladder Neck:  Non-obstructing.  Ureteral Orifices:  Normal location. Normal size. Normal shape.   Bladder:  Large capacity, small diverticulum free of tumor. He however did have a 2 cm papillary superficial appearing bladder tumor on the left lateral wall anteriorly      The lower urinary tract was carefully examined. The procedure was well-tolerated and without complications. Antibiotic instructions were given. Instructions were given to call the office immediately for bloody urine, difficulty urinating, urinary retention, painful or frequent urination, fever, chills, nausea, vomiting or other illness. The patient stated that he understood these instructions and would comply with them.          C.T. Hematuria - 74178  Small nonobstructing renal calculi. Distended bladder. No hydronephrosis. Right renal cyst appears nonenhancing      . Patient confirmed No Neulasta OnPro Device.         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:  [id number]:  [phone]:  [phone]       Document Letter(s):  Created for Patient: Clinical Summary         Notes:   Recommend cystoscopy with transurethral resection of bladder tumor with instillation of gemcitabine. Risk benefits discussed including but not limited to bleeding, infection, injury to surrounding  structures, need for additional procedures, bladder perforation, need for prolonged catheter.   CC: Dr. Hall Nevada Craneigned by EugenLink Snuffer, M.D. on 12/29/22 at 4:44 PM (EST

## 2023-01-25 NOTE — Anesthesia Procedure Notes (Signed)
Procedure Name: Intubation Date/Time: 01/25/2023 7:46 AM  Performed by: Randye Lobo, CRNAPre-anesthesia Checklist: Patient identified, Emergency Drugs available, Suction available and Patient being monitored Patient Re-evaluated:Patient Re-evaluated prior to induction Oxygen Delivery Method: Circle system utilized Preoxygenation: Pre-oxygenation with 100% oxygen Induction Type: IV induction Ventilation: Mask ventilation without difficulty Laryngoscope Size: 4 Grade View: Grade II Tube type: Oral Tube size: 7.0 mm Number of attempts: 1 Airway Equipment and Method: Stylet and Oral airway Placement Confirmation: ETT inserted through vocal cords under direct vision, positive ETCO2 and breath sounds checked- equal and bilateral Secured at: 24 cm Tube secured with: Tape Dental Injury: Teeth and Oropharynx as per pre-operative assessment

## 2023-01-25 NOTE — Anesthesia Postprocedure Evaluation (Signed)
Anesthesia Post Note  Patient: Zachary [name]  Procedure(s) Performed: TRANSURETHRAL RESECTION OF BLADDER TUMOR WITH GEMCITABINE     Patient location during evaluation: PACU Anesthesia Type: General Level of consciousness: awake Pain management: pain level controlled Vital Signs Assessment: post-procedure vital signs reviewed and stable Respiratory status: spontaneous breathing, nonlabored ventilation and respiratory function stable Cardiovascular status: blood pressure returned to baseline and stable Postop Assessment: no apparent nausea or vomiting Anesthetic complications: no   No notable events documented.  Last Vitals:  Vitals:   01/25/23 1015 01/25/23 1221  BP: (!) 144/77 (!) 153/81  Pulse: 75   Resp: 19   Temp: 36.7 C   SpO2: 93%     Last Pain:  Vitals:   01/25/23 1015  TempSrc:   PainSc: 0-No pain                 Xitlally Mooneyham P Azarria Balint

## 2023-01-25 NOTE — Discharge Instructions (Signed)

## 2023-01-25 NOTE — Transfer of Care (Signed)
Immediate Anesthesia Transfer of Care Note  Patient: Zachary [name]  Procedure(s) Performed: TRANSURETHRAL RESECTION OF BLADDER TUMOR WITH GEMCITABINE  Patient Location: PACU  Anesthesia Type:General  Level of Consciousness: awake, alert , and oriented  Airway & Oxygen Therapy: Patient Spontanous Breathing and Patient connected to face mask oxygen  Post-op Assessment: Report given to RN and Post -op Vital signs reviewed and stable  Post vital signs: Reviewed and stable  Last Vitals:  Vitals Value Taken Time  BP    Temp    Pulse 65 01/25/23 0825  Resp 7 01/25/23 0825  SpO2 100 % 01/25/23 0825  Vitals shown include unvalidated device data.  Last Pain:  Vitals:   01/25/23 0645  TempSrc:   PainSc: 0-No pain         Complications: No notable events documented.

## 2023-01-25 NOTE — Op Note (Signed)
Operative Note  Preoperative diagnosis:  1.  Bladder tumor  Postoperative diagnosis: 1.  Bladder tumor--medium  Procedure(s): 1.  Cystoscopy with transurethral resection of bladder tumor--medium 2.  Bilateral retrograde pyelogram  Surgeon: Link Snuffer, MD  Assistants: None  Anesthesia: General  Complications: None immediate  EBL: Minimal  Specimens: 1.  Bladder tumor  Drains/Catheters: 1.  18 French Foley catheter  Intraoperative findings: 1.  Normal anterior urethra 2.  Borderline obstructing prostate 3.  High capacity bladder 4.  Bilateral ureteral orifices within normal position. 5.  Approximately 2.5 cm superficial appearing bladder tumor on the left lateral wall completely resected.  There was some raised slightly irregular mucosa at the trigone that was resected.  All areas of abnormality resected.  Well away from the ureteral orifices.  Indication: 77 year old male with bladder tumor presents for previously mentioned operation.  Description of procedure:  The patient was identified and consent was obtained.  The patient was taken to the operating room and placed in the supine position.  The patient was placed under general anesthesia.  Perioperative antibiotics were administered.  the patient was placed in dorsal lithotomy.  Patient was prepped and draped in a standard sterile fashion and a timeout was performed.  A 26 French resectoscope with a visual obturator in place was advanced into the urethra and into the bladder.  I exchanged for the bipolar working element.  Complete cystoscopy was performed with findings noted above.  Bladder tumors were resected and fulgurated.  All tumor was resected.  I collected the chips for specimen.  There is no evidence of any active bleeding or perforation.  I withdrew the scope and placed a Foley catheter.  This concluded the operation.  Patient tolerated the procedure well and was stable postoperatively.  In the PACU, I instilled  gemcitabine into the bladder where it remained for approximate 1 hour prior to proper disposal.  Plan: Follow-up in 1 week for pathology review.

## 2023-01-25 NOTE — Anesthesia Preprocedure Evaluation (Addendum)
Anesthesia Evaluation  Patient identified by MRN, date of birth, ID band Patient awake    Reviewed: Allergy & Precautions, NPO status , Patient's Chart, lab work & pertinent test results  Airway Mallampati: II  TM Distance: >3 FB Neck ROM: Full    Dental no notable dental hx.    Pulmonary sleep apnea and Continuous Positive Airway Pressure Ventilation , former smoker   Pulmonary exam normal        Cardiovascular hypertension, Pt. on home beta blockers and Pt. on medications Normal cardiovascular exam     Neuro/Psych  Headaches PSYCHIATRIC DISORDERS Anxiety Depression     Neuromuscular disease    GI/Hepatic negative GI ROS, Neg liver ROS,,,  Endo/Other  negative endocrine ROS    Renal/GU Renal disease     Musculoskeletal  (+) Arthritis ,    Abdominal   Peds  Hematology negative hematology ROS (+)   Anesthesia Other Findings BLADDER TUMOR  Reproductive/Obstetrics                             Anesthesia Physical Anesthesia Plan  ASA: 3  Anesthesia Plan: General   Post-op Pain Management:    Induction: Intravenous  PONV Risk Score and Plan: 3 and Ondansetron, Dexamethasone and Treatment may vary due to age or medical condition  Airway Management Planned: Oral ETT  Additional Equipment:   Intra-op Plan:   Post-operative Plan: Extubation in OR  Informed Consent: I have reviewed the patients History and Physical, chart, labs and discussed the procedure including the risks, benefits and alternatives for the proposed anesthesia with the patient or authorized representative who has indicated his/her understanding and acceptance.     Dental advisory given  Plan Discussed with: CRNA  Anesthesia Plan Comments:         Anesthesia Quick Evaluation

## 2023-01-26 ENCOUNTER — Encounter (HOSPITAL_COMMUNITY): Payer: Self-pay | Admitting: Urology

## 2023-01-26 LAB — SURGICAL PATHOLOGY

## 2023-01-29 DIAGNOSIS — N312 Flaccid neuropathic bladder, not elsewhere classified: Secondary | ICD-10-CM | POA: Diagnosis not present

## 2023-01-29 DIAGNOSIS — R310 Gross hematuria: Secondary | ICD-10-CM | POA: Diagnosis not present

## 2023-01-29 DIAGNOSIS — C678 Malignant neoplasm of overlapping sites of bladder: Secondary | ICD-10-CM | POA: Diagnosis not present

## 2023-02-09 ENCOUNTER — Other Ambulatory Visit: Payer: Self-pay

## 2023-02-09 ENCOUNTER — Emergency Department (HOSPITAL_COMMUNITY): Payer: PPO

## 2023-02-09 ENCOUNTER — Inpatient Hospital Stay (HOSPITAL_COMMUNITY)
Admission: EM | Admit: 2023-02-09 | Discharge: 2023-02-13 | DRG: 481 | Disposition: A | Discharge status: Skilled Nursing Facility | Payer: PPO | Attending: Internal Medicine | Admitting: Internal Medicine

## 2023-02-09 DIAGNOSIS — W010XXA Fall on same level from slipping, tripping and stumbling without subsequent striking against object, initial encounter: Secondary | ICD-10-CM | POA: Diagnosis not present

## 2023-02-09 DIAGNOSIS — Z79899 Other long term (current) drug therapy: Secondary | ICD-10-CM | POA: Diagnosis not present

## 2023-02-09 DIAGNOSIS — M47814 Spondylosis without myelopathy or radiculopathy, thoracic region: Secondary | ICD-10-CM | POA: Diagnosis present

## 2023-02-09 DIAGNOSIS — Z8551 Personal history of malignant neoplasm of bladder: Secondary | ICD-10-CM

## 2023-02-09 DIAGNOSIS — M6281 Muscle weakness (generalized): Secondary | ICD-10-CM | POA: Diagnosis not present

## 2023-02-09 DIAGNOSIS — R531 Weakness: Secondary | ICD-10-CM | POA: Diagnosis not present

## 2023-02-09 DIAGNOSIS — I10 Essential (primary) hypertension: Secondary | ICD-10-CM | POA: Diagnosis not present

## 2023-02-09 DIAGNOSIS — S72122A Displaced fracture of lesser trochanter of left femur, initial encounter for closed fracture: Secondary | ICD-10-CM | POA: Diagnosis not present

## 2023-02-09 DIAGNOSIS — E782 Mixed hyperlipidemia: Secondary | ICD-10-CM | POA: Diagnosis present

## 2023-02-09 DIAGNOSIS — G4733 Obstructive sleep apnea (adult) (pediatric): Secondary | ICD-10-CM | POA: Diagnosis present

## 2023-02-09 DIAGNOSIS — Z7901 Long term (current) use of anticoagulants: Secondary | ICD-10-CM | POA: Diagnosis not present

## 2023-02-09 DIAGNOSIS — S72142A Displaced intertrochanteric fracture of left femur, initial encounter for closed fracture: Principal | ICD-10-CM | POA: Diagnosis present

## 2023-02-09 DIAGNOSIS — H9190 Unspecified hearing loss, unspecified ear: Secondary | ICD-10-CM | POA: Diagnosis present

## 2023-02-09 DIAGNOSIS — S72142D Displaced intertrochanteric fracture of left femur, subsequent encounter for closed fracture with routine healing: Secondary | ICD-10-CM | POA: Diagnosis not present

## 2023-02-09 DIAGNOSIS — M1612 Unilateral primary osteoarthritis, left hip: Secondary | ICD-10-CM | POA: Diagnosis not present

## 2023-02-09 DIAGNOSIS — Z981 Arthrodesis status: Secondary | ICD-10-CM

## 2023-02-09 DIAGNOSIS — Z96652 Presence of left artificial knee joint: Secondary | ICD-10-CM | POA: Diagnosis present

## 2023-02-09 DIAGNOSIS — G8929 Other chronic pain: Secondary | ICD-10-CM | POA: Diagnosis not present

## 2023-02-09 DIAGNOSIS — G629 Polyneuropathy, unspecified: Secondary | ICD-10-CM | POA: Diagnosis not present

## 2023-02-09 DIAGNOSIS — Z8249 Family history of ischemic heart disease and other diseases of the circulatory system: Secondary | ICD-10-CM

## 2023-02-09 DIAGNOSIS — Z23 Encounter for immunization: Secondary | ICD-10-CM | POA: Diagnosis not present

## 2023-02-09 DIAGNOSIS — Z882 Allergy status to sulfonamides status: Secondary | ICD-10-CM

## 2023-02-09 DIAGNOSIS — M4802 Spinal stenosis, cervical region: Secondary | ICD-10-CM | POA: Diagnosis present

## 2023-02-09 DIAGNOSIS — R269 Unspecified abnormalities of gait and mobility: Secondary | ICD-10-CM

## 2023-02-09 DIAGNOSIS — R41841 Cognitive communication deficit: Secondary | ICD-10-CM | POA: Diagnosis not present

## 2023-02-09 DIAGNOSIS — F32A Depression, unspecified: Secondary | ICD-10-CM | POA: Diagnosis present

## 2023-02-09 DIAGNOSIS — Z87891 Personal history of nicotine dependence: Secondary | ICD-10-CM

## 2023-02-09 DIAGNOSIS — R7303 Prediabetes: Secondary | ICD-10-CM | POA: Diagnosis not present

## 2023-02-09 DIAGNOSIS — S0003XA Contusion of scalp, initial encounter: Secondary | ICD-10-CM | POA: Diagnosis not present

## 2023-02-09 DIAGNOSIS — S72002A Fracture of unspecified part of neck of left femur, initial encounter for closed fracture: Secondary | ICD-10-CM | POA: Diagnosis not present

## 2023-02-09 DIAGNOSIS — R102 Pelvic and perineal pain: Secondary | ICD-10-CM | POA: Diagnosis not present

## 2023-02-09 DIAGNOSIS — F419 Anxiety disorder, unspecified: Secondary | ICD-10-CM | POA: Diagnosis present

## 2023-02-09 DIAGNOSIS — W19XXXA Unspecified fall, initial encounter: Secondary | ICD-10-CM | POA: Diagnosis not present

## 2023-02-09 DIAGNOSIS — Z7401 Bed confinement status: Secondary | ICD-10-CM | POA: Diagnosis not present

## 2023-02-09 DIAGNOSIS — R2689 Other abnormalities of gait and mobility: Secondary | ICD-10-CM | POA: Diagnosis not present

## 2023-02-09 DIAGNOSIS — G894 Chronic pain syndrome: Secondary | ICD-10-CM | POA: Diagnosis not present

## 2023-02-09 DIAGNOSIS — Z961 Presence of intraocular lens: Secondary | ICD-10-CM | POA: Diagnosis present

## 2023-02-09 DIAGNOSIS — Z825 Family history of asthma and other chronic lower respiratory diseases: Secondary | ICD-10-CM

## 2023-02-09 DIAGNOSIS — Z01818 Encounter for other preprocedural examination: Secondary | ICD-10-CM | POA: Diagnosis not present

## 2023-02-09 DIAGNOSIS — Z9841 Cataract extraction status, right eye: Secondary | ICD-10-CM

## 2023-02-09 DIAGNOSIS — D62 Acute posthemorrhagic anemia: Secondary | ICD-10-CM | POA: Diagnosis not present

## 2023-02-09 DIAGNOSIS — Z9989 Dependence on other enabling machines and devices: Secondary | ICD-10-CM | POA: Diagnosis not present

## 2023-02-09 DIAGNOSIS — M1990 Unspecified osteoarthritis, unspecified site: Secondary | ICD-10-CM | POA: Diagnosis not present

## 2023-02-09 DIAGNOSIS — S199XXA Unspecified injury of neck, initial encounter: Secondary | ICD-10-CM | POA: Diagnosis not present

## 2023-02-09 DIAGNOSIS — M25552 Pain in left hip: Secondary | ICD-10-CM | POA: Diagnosis not present

## 2023-02-09 DIAGNOSIS — M2578 Osteophyte, vertebrae: Secondary | ICD-10-CM | POA: Diagnosis not present

## 2023-02-09 DIAGNOSIS — Z9842 Cataract extraction status, left eye: Secondary | ICD-10-CM | POA: Diagnosis not present

## 2023-02-09 DIAGNOSIS — Z9181 History of falling: Secondary | ICD-10-CM | POA: Diagnosis not present

## 2023-02-09 DIAGNOSIS — F418 Other specified anxiety disorders: Secondary | ICD-10-CM | POA: Diagnosis not present

## 2023-02-09 HISTORY — DX: Displaced intertrochanteric fracture of left femur, initial encounter for closed fracture: S72.142A

## 2023-02-09 LAB — BASIC METABOLIC PANEL
Anion gap: 11 (ref 5–15)
BUN: 27 mg/dL — ABNORMAL HIGH (ref 8–23)
CO2: 25 mmol/L (ref 22–32)
Calcium: 8.5 mg/dL — ABNORMAL LOW (ref 8.9–10.3)
Chloride: 105 mmol/L (ref 98–111)
Creatinine, Ser: 1.82 mg/dL — ABNORMAL HIGH (ref 0.61–1.24)
GFR, Estimated: 38 mL/min — ABNORMAL LOW (ref >60)
Glucose, Bld: 96 mg/dL (ref 70–99)
Potassium: 3.6 mmol/L (ref 3.5–5.1)
Sodium: 141 mmol/L (ref 135–145)

## 2023-02-09 LAB — CBC WITH DIFFERENTIAL/PLATELET
Abs Immature Granulocytes: 0.08 10*3/uL — ABNORMAL HIGH (ref 0.00–0.07)
Basophils Absolute: 0.1 10*3/uL (ref 0.0–0.1)
Basophils Relative: 1 %
Eosinophils Absolute: 0.2 10*3/uL (ref 0.0–0.5)
Eosinophils Relative: 2 %
HCT: 39.1 % (ref 39.0–52.0)
Hemoglobin: 12.4 g/dL — ABNORMAL LOW (ref 13.0–17.0)
Immature Granulocytes: 1 %
Lymphocytes Relative: 16 %
Lymphs Abs: 1.5 10*3/uL (ref 0.7–4.0)
MCH: 29.5 pg (ref 26.0–34.0)
MCHC: 31.7 g/dL (ref 30.0–36.0)
MCV: 93.1 fL (ref 80.0–100.0)
Monocytes Absolute: 0.5 10*3/uL (ref 0.1–1.0)
Monocytes Relative: 5 %
Neutro Abs: 7.1 10*3/uL (ref 1.7–7.7)
Neutrophils Relative %: 75 %
Platelets: 235 10*3/uL (ref 150–400)
RBC: 4.2 MIL/uL — ABNORMAL LOW (ref 4.22–5.81)
RDW: 13.5 % (ref 11.5–15.5)
WBC: 9.4 10*3/uL (ref 4.0–10.5)
nRBC: 0 % (ref 0.0–0.2)

## 2023-02-09 LAB — TYPE AND SCREEN
ABO/RH(D): O POS
Antibody Screen: NEGATIVE

## 2023-02-09 LAB — PROTIME-INR
INR: 1 (ref 0.8–1.2)
Prothrombin Time: 13.3 seconds (ref 11.4–15.2)

## 2023-02-09 MED ORDER — HEPARIN SODIUM (PORCINE) 5000 UNIT/ML IJ SOLN
5000.0000 [IU] | Freq: Three times a day (TID) | INTRAMUSCULAR | Status: DC
  Administered 2023-02-10: 5000 [IU] via SUBCUTANEOUS
  Filled 2023-02-09: qty 1

## 2023-02-09 MED ORDER — IRBESARTAN 75 MG PO TABS
37.5000 mg | ORAL_TABLET | Freq: Every day | ORAL | Status: DC
  Administered 2023-02-11 – 2023-02-13 (×3): 37.5 mg via ORAL
  Filled 2023-02-09 (×3): qty 0.5

## 2023-02-09 MED ORDER — SODIUM CHLORIDE 0.9 % IV SOLN: INTRAVENOUS | Status: DC

## 2023-02-09 MED ORDER — ACETAMINOPHEN 650 MG RE SUPP: 650.0000 mg | Freq: Four times a day (QID) | RECTAL | Status: DC | PRN

## 2023-02-09 MED ORDER — GABAPENTIN 300 MG PO CAPS
300.0000 mg | ORAL_CAPSULE | Freq: Every day | ORAL | Status: DC
  Administered 2023-02-09 – 2023-02-12 (×4): 300 mg via ORAL
  Filled 2023-02-09 (×4): qty 1

## 2023-02-09 MED ORDER — TAMSULOSIN HCL 0.4 MG PO CAPS
0.4000 mg | ORAL_CAPSULE | Freq: Every day | ORAL | Status: DC
  Administered 2023-02-11 – 2023-02-13 (×3): 0.4 mg via ORAL
  Filled 2023-02-09 (×3): qty 1

## 2023-02-09 MED ORDER — ONDANSETRON HCL 4 MG/2ML IJ SOLN: 4.0000 mg | Freq: Four times a day (QID) | INTRAMUSCULAR | Status: DC | PRN

## 2023-02-09 MED ORDER — ONDANSETRON HCL 4 MG/2ML IJ SOLN
4.0000 mg | Freq: Four times a day (QID) | INTRAMUSCULAR | Status: DC | PRN
  Administered 2023-02-10: 4 mg via INTRAVENOUS
  Filled 2023-02-09: qty 2

## 2023-02-09 MED ORDER — ONDANSETRON HCL 4 MG PO TABS: 4.0000 mg | ORAL_TABLET | Freq: Four times a day (QID) | ORAL | Status: DC | PRN

## 2023-02-09 MED ORDER — CARVEDILOL 3.125 MG PO TABS
3.1250 mg | ORAL_TABLET | Freq: Two times a day (BID) | ORAL | Status: DC
  Administered 2023-02-10 – 2023-02-13 (×7): 3.125 mg via ORAL
  Filled 2023-02-09 (×7): qty 1

## 2023-02-09 MED ORDER — FENTANYL CITRATE PF 50 MCG/ML IJ SOSY
50.0000 ug | PREFILLED_SYRINGE | INTRAMUSCULAR | Status: AC | PRN
  Administered 2023-02-09 (×2): 50 ug via INTRAVENOUS
  Filled 2023-02-09 (×2): qty 1

## 2023-02-09 MED ORDER — DULOXETINE HCL 60 MG PO CPEP
60.0000 mg | ORAL_CAPSULE | Freq: Every day | ORAL | Status: DC
  Administered 2023-02-11 – 2023-02-13 (×3): 60 mg via ORAL
  Filled 2023-02-09 (×3): qty 1

## 2023-02-09 MED ORDER — OXYCODONE HCL 5 MG PO TABS
5.0000 mg | ORAL_TABLET | ORAL | Status: DC | PRN
  Administered 2023-02-09 – 2023-02-13 (×6): 5 mg via ORAL
  Filled 2023-02-09 (×6): qty 1

## 2023-02-09 MED ORDER — SODIUM BICARBONATE 650 MG PO TABS
650.0000 mg | ORAL_TABLET | Freq: Every day | ORAL | Status: DC
  Administered 2023-02-11 – 2023-02-13 (×3): 650 mg via ORAL
  Filled 2023-02-09 (×3): qty 1

## 2023-02-09 MED ORDER — PRAVASTATIN SODIUM 10 MG PO TABS
10.0000 mg | ORAL_TABLET | Freq: Every day | ORAL | Status: DC
  Administered 2023-02-11 – 2023-02-12 (×2): 10 mg via ORAL
  Filled 2023-02-09 (×4): qty 1

## 2023-02-09 MED ORDER — ACETAMINOPHEN 325 MG PO TABS: 650.0000 mg | ORAL_TABLET | Freq: Four times a day (QID) | ORAL | Status: DC | PRN

## 2023-02-09 MED ORDER — MORPHINE SULFATE (PF) 2 MG/ML IV SOLN
2.0000 mg | INTRAVENOUS | Status: DC | PRN
  Administered 2023-02-09 – 2023-02-10 (×2): 2 mg via INTRAVENOUS
  Filled 2023-02-09 (×2): qty 1

## 2023-02-09 NOTE — ED Provider Notes (Signed)
[place] Provider Note   CSN: [id number] Arrival date & time: 02/09/23  1927     History  Chief Complaint  Patient presents with   Zachary [name] is a 77 y.o. male.   Fall  77 year old male, history of gait instability and peripheral neuropathy, history of hypertension on Coreg, valsartan and a recent procedure for his prostate.,  Had a transurethral resection of the prostate on January 29.  He presents from church where he was playing with a child when he fell over backwards losing his balance and striking the back of his head on the ground.  He also struck his left hip.  He had no loss of consciousness and complains of a mild headache.  Pain is worse with any movement of the left leg.  No chest pain or shortness of breath     Home Medications Prior to Admission medications   Medication Sig Start Date End Date Taking? Authorizing Provider  acetaminophen (TYLENOL) 500 MG tablet Take 1,000 mg by mouth at bedtime as needed for moderate pain.    [provider]  albuterol (VENTOLIN HFA) 108 (90 Base) MCG/ACT inhaler Inhale 2 puffs into the lungs every 4 (four) hours as needed for wheezing or shortness of breath. 11/13/22   Volney American, PA-C  carvedilol (COREG) 3.125 MG tablet Take 3.125 mg by mouth 2 (two) times daily.    [provider]  dorzolamide-timolol (COSOPT) 2-0.5 % ophthalmic solution Place 1 drop into both eyes 2 (two) times daily. 01/02/23   [provider]  DULoxetine (CYMBALTA) 60 MG capsule TAKE 1 CAPSULE(60 MG) BY MOUTH DAILY 03/02/22   Cloria Spring, MD  guaiFENesin (MUCINEX) 600 MG 12 hr tablet Take 1 tablet (600 mg total) by mouth 2 (two) times daily. Patient not taking: Reported on 01/11/2023 11/13/22   Volney American, PA-C  HYDROcodone-acetaminophen [facility]) 5-325 MG tablet Take 1 tablet by mouth every 4 (four) hours as needed for moderate pain. 01/25/23   Marton Redwood  III, MD  latanoprost (XALATAN) 0.005 % ophthalmic solution Place 1 drop into both eyes at bedtime.    [provider]  pravastatin (PRAVACHOL) 10 MG tablet Take 10 mg by mouth at bedtime. 01/23/20   [provider]  promethazine-dextromethorphan (PROMETHAZINE-DM) 6.25-15 MG/5ML syrup Take 5 mLs by mouth 4 (four) times daily as needed. Patient not taking: Reported on 01/11/2023 11/13/22   Volney American, PA-C  sodium bicarbonate 650 MG tablet Take 650 mg by mouth 2 (two) times daily.    [provider]  tamsulosin (FLOMAX) 0.4 MG CAPS capsule Take 0.4 mg by mouth daily.    [provider]  valsartan (DIOVAN) 40 MG tablet Take 20 mg by mouth daily.    [provider]      Allergies    Poison oak extract, Sulfonamide derivatives, and Sulfa antibiotics    Review of Systems   Review of Systems  All other systems reviewed and are negative.   Physical Exam Updated Vital Signs BP (!) 165/95   Pulse 79   Resp 16   Wt 97.5 kg   SpO2 96%   BMI 28.37 kg/m  Physical Exam Vitals and nursing note reviewed.  Constitutional:      General: He is not in acute distress.    Appearance: He is well-developed.  HENT:     Head: Normocephalic.     Comments: Hematoma to the occipital  crown of the head    Mouth/Throat:     Pharynx: No oropharyngeal exudate.  Eyes:     General: No scleral icterus.       Right eye: No discharge.        Left eye: No discharge.     Conjunctiva/sclera: Conjunctivae normal.     Pupils: Pupils are equal, round, and reactive to light.  Neck:     Thyroid: No thyromegaly.     Vascular: No JVD.  Cardiovascular:     Rate and Rhythm: Normal rate and regular rhythm.     Heart sounds: Normal heart sounds. No murmur heard.    No friction rub. No gallop.  Pulmonary:     Effort: Pulmonary effort is normal. No respiratory distress.     Breath sounds: Normal breath sounds. No wheezing or rales.  Abdominal:     General: Bowel  sounds are normal. There is no distension.     Palpations: Abdomen is soft. There is no mass.     Tenderness: There is no abdominal tenderness.  Musculoskeletal:        General: Tenderness and deformity present. No swelling.     Cervical back: Normal range of motion and neck supple.     Right lower leg: No edema.     Left lower leg: No edema.     Comments: Left lower extremity is slightly shortened and externally rotated.  There is significant tenderness with any movement of the left lower extremity.  Bilateral upper extremities are normal in their appearance and movement.  There is no tenderness over the chest wall, no spinal tenderness  Lymphadenopathy:     Cervical: No cervical adenopathy.  Skin:    General: Skin is warm and dry.     Findings: No erythema or rash.  Neurological:     Mental Status: He is alert.     Coordination: Coordination normal.  Psychiatric:        Behavior: Behavior normal.     ED Results / Procedures / Treatments   Labs (all labs ordered are listed, but only abnormal results are displayed) Labs Reviewed  BASIC METABOLIC PANEL - Abnormal; Notable for the following components:      Result Value   BUN 27 (*)    Creatinine, Ser 1.82 (*)    Calcium 8.5 (*)    GFR, Estimated 38 (*)    All other components within normal limits  CBC WITH DIFFERENTIAL/PLATELET - Abnormal; Notable for the following components:   RBC 4.20 (*)    Hemoglobin 12.4 (*)    Abs Immature Granulocytes 0.08 (*)    All other components within normal limits  PROTIME-INR  TYPE AND SCREEN    EKG EKG Interpretation  Date/Time:  Tuesday February 09 2023 19:40:06 EST Ventricular Rate:  73 PR Interval:  182 QRS Duration: 159 QT Interval:  405 QTC Calculation: 447 R Axis:   59 Text Interpretation: Sinus rhythm Right bundle branch block since last tracing no significant change Confirmed by Noemi Chapel [phone]) on 02/09/2023 7:42:23 PM  Radiology CT CERVICAL SPINE WO  CONTRAST  Result Date: 02/09/2023 CLINICAL DATA:  Poly trauma blunt. [employer]. Hematoma to the back of the head. EXAM: CT CERVICAL SPINE WITHOUT CONTRAST TECHNIQUE: Multidetector CT imaging of the cervical spine was performed without intravenous contrast. Multiplanar CT image reconstructions were also generated. RADIATION DOSE REDUCTION: This exam was performed according to the departmental dose-optimization program which includes automated exposure control, adjustment of the mA and/or kV  according to patient size and/or use of iterative reconstruction technique. COMPARISON:  CT 08/26/2021.  MRI 01/20/2023 FINDINGS: Alignment: Normal alignment. Skull base and vertebrae: Skull base appears intact. No vertebral compression deformities. No focal bone lesion or bone destruction. Soft tissues and spinal canal: No prevertebral soft tissue swelling. No abnormal paraspinal soft tissue mass or infiltration. Disc levels: Degenerative changes throughout with narrowed disc spaces and endplate osteophyte formation. Bridging osteophytes in the lower cervical region. Postoperative changes with anterior plate and screw fixation and intervertebral fusion at C3-4. Degenerative changes in the facet joints. Upper chest: Lung apices are clear. Other: None. IMPRESSION: Normal alignment. Postoperative and degenerative changes as discussed. No acute displaced fractures are identified. Electronically Signed   By: Lucienne Capers M.D.   On: 02/09/2023 20:43   CT HEAD WO CONTRAST  Result Date: 02/09/2023 CLINICAL DATA:  Poly trauma, blunt. Fell at [employer]. Hematoma to the back of the head. Patient is on blood thinners. No loss of consciousness. EXAM: CT HEAD WITHOUT CONTRAST TECHNIQUE: Contiguous axial images were obtained from the base of the skull through the vertex without intravenous contrast. RADIATION DOSE REDUCTION: This exam was performed according to the departmental dose-optimization program which includes  automated exposure control, adjustment of the mA and/or kV according to patient size and/or use of iterative reconstruction technique. COMPARISON:  MRI brain 07/19/2021.  CT head 10/06/2012 FINDINGS: Brain: No evidence of acute infarction, hemorrhage, hydrocephalus, extra-axial collection or mass lesion/mass effect. Mild cerebral atrophy. Patchy low-attenuation changes in the deep white matter consistent with small vessel ischemia. Vascular: No hyperdense vessel or unexpected calcification. Skull: Normal. Negative for fracture or focal lesion. Sinuses/Orbits: Paranasal sinuses and mastoid air cells are clear. Other: Subcutaneous scalp hematoma over the posterior vertex. IMPRESSION: No acute intracranial abnormalities. Mild chronic atrophy and small vessel ischemic changes. Electronically Signed   By: Lucienne Capers M.D.   On: 02/09/2023 20:41   DG Pelvis 1-2 Views  Result Date: 02/09/2023 CLINICAL DATA:  [place] at [employer]. Pain and rotation of the left leg. EXAM: PELVIS - 1-2 VIEW COMPARISON:  Abdominal radiograph 04/10/2020 FINDINGS: Acute comminuted fracture of the inter trochanteric left proximal femur with displaced lesser trochanteric fragment and varus angulation of the fracture fragments. Degenerative changes in the bilateral hips. Pelvis and sacrum appear intact. Postoperative and degenerative changes in the lower lumbar spine. IMPRESSION: Acute comminuted fracture of the inter trochanteric left femur with varus angulation. Electronically Signed   By: Lucienne Capers M.D.   On: 02/09/2023 20:38   DG Chest 1 View  Result Date: 02/09/2023 CLINICAL DATA:  Preoperative.  Fell at [employer]. EXAM: CHEST  1 VIEW COMPARISON:  11/13/2022 FINDINGS: Shallow inspiration. Heart size and pulmonary vascularity are normal. Lungs are clear. No pleural effusions. No pneumothorax. Mediastinal contours appear intact. Degenerative changes in the spine and shoulders. IMPRESSION: No active disease.  Electronically Signed   By: Lucienne Capers M.D.   On: 02/09/2023 20:37   DG FEMUR MIN 2 VIEWS LEFT  Result Date: 02/09/2023 CLINICAL DATA:  [place] at [employer]. Pain and rotation of the left leg. EXAM: LEFT FEMUR 2 VIEWS COMPARISON:  None Available. FINDINGS: Acute comminuted inter trochanteric fractures of the left hip with mild varus angulation. Displaced lesser trochanteric fragment. Degenerative changes in the left hip without dislocation. Visualized left hemipelvis and the remainder of the femur appear intact. Postoperative left knee arthroplasty. Vascular calcifications. Postoperative and degenerative changes in the lower lumbar spine. IMPRESSION: Acute comminuted inter trochanteric fractures of  the left proximal femur with varus angulation. Electronically Signed   By: Lucienne Capers M.D.   On: 02/09/2023 20:36    Procedures Procedures    Medications Ordered in ED Medications  0.9 %  sodium chloride infusion ( Intravenous New Bag/Given 02/09/23 2048)  ondansetron (ZOFRAN) injection 4 mg (has no administration in time range)  fentaNYL (SUBLIMAZE) injection 50 mcg (50 mcg Intravenous Given 02/09/23 2048)    ED Course/ Medical Decision Making/ A&P                             Medical Decision Making Amount and/or Complexity of Data Reviewed Labs: ordered. Radiology: ordered.  Risk Prescription drug management. Decision regarding hospitalization.   This patient presents to the ED for concern of fall with left hip pain and a head injury, this involves an extensive number of treatment options, and is a complaint that carries with it a high risk of complications and morbidity.  The differential diagnosis includes scalp hematoma, fracture of the skull, intracranial hemorrhage, fracture of the hip, spinal injury   Co morbidities that complicate the patient evaluation  Gait instability and paresthesias and peripheral neuropathy   Additional history obtained:  Additional  history obtained from electronic medical record External records from outside source obtained and reviewed including recent MRIs performed, I have viewed these results and agree that there is no obvious fractures or signs of spinal compression to any significant degree   Lab Tests:  I Ordered, and personally interpreted labs.  The pertinent results include: CBC and metabolic panel unremarkable except for mild renal insufficiency.  INR of 1.0   Imaging Studies ordered:  I ordered imaging studies including CT scan of the head and cervical spine unremarkable, x-ray of the hip shows an intertrochanteric hip fracture I independently visualized and interpreted imaging which showed intertrochanteric hip fracture I agree with the radiologist interpretation   Cardiac Monitoring: / EKG:  The patient was maintained on a cardiac monitor.  I personally viewed and interpreted the cardiac monitored which showed an underlying rhythm of: Normal sinus rhythm   Consultations Obtained:  I requested consultation with the orthopedist Dr. Cornelius Moras and the hospitalist Dr. Clearence Ped,  and discussed lab and imaging findings as well as pertinent plan - they recommend: Admitting to [facility], n.p.o. after midnight   Problem List / ED Course / Critical interventions / Medication management  Hip fracture, pain control, labs reassuring I ordered medication including fentanyl for pain Reevaluation of the patient after these medicines showed that the patient improved I have reviewed the patients home medicines and have made adjustments as needed   Social Determinants of Health:  Gait instability at baseline   Test / Admission - Considered:  Admit and transfer to [facility] for surgery in the morning         Final Clinical Impression(s) / ED Diagnoses Final diagnoses:  Closed fracture of left hip, initial encounter [facility])    Rx / [place] Orders ED Discharge Orders     None          Noemi Chapel, MD 02/09/23 2125

## 2023-02-09 NOTE — ED Triage Notes (Addendum)
Pt presents with fall at [employer]. Pt has pain and rotation to left leg. Pt also has hematoma to the back of head. Pt is on blood thinners and reports that he did not lose consciousness.   Received 32mg of Fentanyl in route 22 to R hand

## 2023-02-09 NOTE — Assessment & Plan Note (Signed)
-   X-ray shows left acute comminuted intertrochanteric fracture of the proximal femur with varus angulation -Recommends n.p.o. at night, transfer to [facility], surgery in the a.m. - Continue pain control - Continue to monitor

## 2023-02-09 NOTE — Assessment & Plan Note (Signed)
-   Continue Coreg, valsartan

## 2023-02-09 NOTE — Assessment & Plan Note (Signed)
-   Worked up by neurology outpatient - Last visit: Patient does not meet criteria for Parkinson's disease, patient to follow-up with Dr. Dellis Anes regarding spinal stenosis issues, discussed that due to his peripheral neuropathy the best treatment for his balance would be physical therapy-and patient stated he would think about that. 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  C3/4 ACDF with solid arthrodesis.  Spurring causes moderate left foraminal narrowing at C5/6 to C7/T1.  MRI thoracic spine: diffuse spondylosis with multilevel bridging osteophytes.  No neural impingement or visible inflammation - Defer further workup to outpatient neurology

## 2023-02-09 NOTE — Assessment & Plan Note (Signed)
Continue statin. 

## 2023-02-10 ENCOUNTER — Inpatient Hospital Stay (HOSPITAL_COMMUNITY): Payer: PPO

## 2023-02-10 ENCOUNTER — Inpatient Hospital Stay (HOSPITAL_COMMUNITY): Payer: PPO | Admitting: Anesthesiology

## 2023-02-10 ENCOUNTER — Encounter (HOSPITAL_COMMUNITY): Payer: Self-pay | Admitting: Family Medicine

## 2023-02-10 ENCOUNTER — Telehealth: Payer: Self-pay | Admitting: Orthopedic Surgery

## 2023-02-10 ENCOUNTER — Other Ambulatory Visit: Payer: Self-pay

## 2023-02-10 ENCOUNTER — Encounter (HOSPITAL_COMMUNITY)
Admission: EM | Disposition: A | Discharge status: Skilled Nursing Facility | Payer: Self-pay | Source: Home / Self Care | Attending: Internal Medicine

## 2023-02-10 DIAGNOSIS — Z87891 Personal history of nicotine dependence: Secondary | ICD-10-CM

## 2023-02-10 DIAGNOSIS — F418 Other specified anxiety disorders: Secondary | ICD-10-CM

## 2023-02-10 DIAGNOSIS — E782 Mixed hyperlipidemia: Secondary | ICD-10-CM | POA: Diagnosis not present

## 2023-02-10 DIAGNOSIS — I10 Essential (primary) hypertension: Secondary | ICD-10-CM

## 2023-02-10 DIAGNOSIS — R269 Unspecified abnormalities of gait and mobility: Secondary | ICD-10-CM | POA: Diagnosis not present

## 2023-02-10 DIAGNOSIS — S72142A Displaced intertrochanteric fracture of left femur, initial encounter for closed fracture: Secondary | ICD-10-CM | POA: Diagnosis not present

## 2023-02-10 HISTORY — PX: INTRAMEDULLARY (IM) NAIL INTERTROCHANTERIC: SHX5875

## 2023-02-10 LAB — CBC WITH DIFFERENTIAL/PLATELET
Abs Immature Granulocytes: 0.07 10*3/uL (ref 0.00–0.07)
Basophils Absolute: 0 10*3/uL (ref 0.0–0.1)
Basophils Relative: 0 %
Eosinophils Absolute: 0 10*3/uL (ref 0.0–0.5)
Eosinophils Relative: 0 %
HCT: 34.1 % — ABNORMAL LOW (ref 39.0–52.0)
Hemoglobin: 10.9 g/dL — ABNORMAL LOW (ref 13.0–17.0)
Immature Granulocytes: 1 %
Lymphocytes Relative: 10 %
Lymphs Abs: 1.2 10*3/uL (ref 0.7–4.0)
MCH: 29.6 pg (ref 26.0–34.0)
MCHC: 32 g/dL (ref 30.0–36.0)
MCV: 92.7 fL (ref 80.0–100.0)
Monocytes Absolute: 0.6 10*3/uL (ref 0.1–1.0)
Monocytes Relative: 5 %
Neutro Abs: 10.4 10*3/uL — ABNORMAL HIGH (ref 1.7–7.7)
Neutrophils Relative %: 84 %
Platelets: 219 10*3/uL (ref 150–400)
RBC: 3.68 MIL/uL — ABNORMAL LOW (ref 4.22–5.81)
RDW: 13.5 % (ref 11.5–15.5)
WBC: 12.3 10*3/uL — ABNORMAL HIGH (ref 4.0–10.5)
nRBC: 0 % (ref 0.0–0.2)

## 2023-02-10 LAB — COMPREHENSIVE METABOLIC PANEL
ALT: 9 U/L (ref 0–44)
AST: 15 U/L (ref 15–41)
Albumin: 3 g/dL — ABNORMAL LOW (ref 3.5–5.0)
Alkaline Phosphatase: 77 U/L (ref 38–126)
Anion gap: 8 (ref 5–15)
BUN: 26 mg/dL — ABNORMAL HIGH (ref 8–23)
CO2: 24 mmol/L (ref 22–32)
Calcium: 8 mg/dL — ABNORMAL LOW (ref 8.9–10.3)
Chloride: 109 mmol/L (ref 98–111)
Creatinine, Ser: 1.59 mg/dL — ABNORMAL HIGH (ref 0.61–1.24)
GFR, Estimated: 45 mL/min — ABNORMAL LOW (ref >60)
Glucose, Bld: 116 mg/dL — ABNORMAL HIGH (ref 70–99)
Potassium: 3.8 mmol/L (ref 3.5–5.1)
Sodium: 141 mmol/L (ref 135–145)
Total Bilirubin: 0.8 mg/dL (ref 0.3–1.2)
Total Protein: 6.1 g/dL — ABNORMAL LOW (ref 6.5–8.1)

## 2023-02-10 LAB — MAGNESIUM: Magnesium: 1.6 mg/dL — ABNORMAL LOW (ref 1.7–2.4)

## 2023-02-10 LAB — CBC
HCT: 36.1 % — ABNORMAL LOW (ref 39.0–52.0)
Hemoglobin: 11.8 g/dL — ABNORMAL LOW (ref 13.0–17.0)
MCH: 30.3 pg (ref 26.0–34.0)
MCHC: 32.7 g/dL (ref 30.0–36.0)
MCV: 92.8 fL (ref 80.0–100.0)
Platelets: 215 10*3/uL (ref 150–400)
RBC: 3.89 MIL/uL — ABNORMAL LOW (ref 4.22–5.81)
RDW: 13.8 % (ref 11.5–15.5)
WBC: 13.7 10*3/uL — ABNORMAL HIGH (ref 4.0–10.5)
nRBC: 0 % (ref 0.0–0.2)

## 2023-02-10 LAB — CREATININE, SERUM
Creatinine, Ser: 1.67 mg/dL — ABNORMAL HIGH (ref 0.61–1.24)
GFR, Estimated: 42 mL/min — ABNORMAL LOW (ref >60)

## 2023-02-10 LAB — SURGICAL PCR SCREEN
MRSA, PCR: NEGATIVE
Staphylococcus aureus: NEGATIVE

## 2023-02-10 SURGERY — FIXATION, FRACTURE, INTERTROCHANTERIC, WITH INTRAMEDULLARY ROD
Anesthesia: General | Site: Leg Upper | Laterality: Left

## 2023-02-10 MED ORDER — DEXAMETHASONE SODIUM PHOSPHATE 10 MG/ML IJ SOLN
INTRAMUSCULAR | Status: AC
  Filled 2023-02-10: qty 1

## 2023-02-10 MED ORDER — PNEUMOCOCCAL 20-VAL CONJ VACC 0.5 ML IM SUSY
0.5000 mL | PREFILLED_SYRINGE | INTRAMUSCULAR | Status: AC
  Administered 2023-02-12: 0.5 mL via INTRAMUSCULAR
  Filled 2023-02-10 (×2): qty 0.5

## 2023-02-10 MED ORDER — LACTATED RINGERS IV SOLN: INTRAVENOUS | Status: DC

## 2023-02-10 MED ORDER — FENTANYL CITRATE (PF) 100 MCG/2ML IJ SOLN
INTRAMUSCULAR | Status: AC
  Filled 2023-02-10: qty 2

## 2023-02-10 MED ORDER — PROPOFOL 10 MG/ML IV BOLUS
INTRAVENOUS | Status: AC
  Filled 2023-02-10: qty 20

## 2023-02-10 MED ORDER — MIDAZOLAM HCL 2 MG/2ML IJ SOLN
INTRAMUSCULAR | Status: AC
  Filled 2023-02-10: qty 2

## 2023-02-10 MED ORDER — METHOCARBAMOL 500 MG PO TABS
500.0000 mg | ORAL_TABLET | Freq: Four times a day (QID) | ORAL | Status: DC | PRN
  Administered 2023-02-11 – 2023-02-13 (×2): 500 mg via ORAL
  Filled 2023-02-10 (×2): qty 1

## 2023-02-10 MED ORDER — OXYCODONE HCL 5 MG PO TABS: 5.0000 mg | ORAL_TABLET | Freq: Once | ORAL | Status: DC | PRN

## 2023-02-10 MED ORDER — LIDOCAINE 2% (20 MG/ML) 5 ML SYRINGE
INTRAMUSCULAR | Status: DC | PRN
  Administered 2023-02-10: 60 mg via INTRAVENOUS

## 2023-02-10 MED ORDER — EPHEDRINE SULFATE-NACL 50-0.9 MG/10ML-% IV SOSY
PREFILLED_SYRINGE | INTRAVENOUS | Status: DC | PRN
  Administered 2023-02-10 (×2): 5 mg via INTRAVENOUS

## 2023-02-10 MED ORDER — DEXAMETHASONE SODIUM PHOSPHATE 10 MG/ML IJ SOLN
INTRAMUSCULAR | Status: DC | PRN
  Administered 2023-02-10: 10 mg via INTRAVENOUS

## 2023-02-10 MED ORDER — METOCLOPRAMIDE HCL 5 MG/ML IJ SOLN: 5.0000 mg | Freq: Three times a day (TID) | INTRAMUSCULAR | Status: DC | PRN

## 2023-02-10 MED ORDER — EPHEDRINE 5 MG/ML INJ
INTRAVENOUS | Status: AC
  Filled 2023-02-10: qty 5

## 2023-02-10 MED ORDER — METOCLOPRAMIDE HCL 5 MG PO TABS: 5.0000 mg | ORAL_TABLET | Freq: Three times a day (TID) | ORAL | Status: DC | PRN

## 2023-02-10 MED ORDER — 0.9 % SODIUM CHLORIDE (POUR BTL) OPTIME
TOPICAL | Status: DC | PRN
  Administered 2023-02-10: 1000 mL

## 2023-02-10 MED ORDER — TRANEXAMIC ACID-NACL 1000-0.7 MG/100ML-% IV SOLN: 1000.0000 mg | Freq: Once | INTRAVENOUS | Status: DC

## 2023-02-10 MED ORDER — METHOCARBAMOL 1000 MG/10ML IJ SOLN: 500.0000 mg | Freq: Four times a day (QID) | INTRAVENOUS | Status: DC | PRN

## 2023-02-10 MED ORDER — OXYCODONE HCL 5 MG/5ML PO SOLN: 5.0000 mg | Freq: Once | ORAL | Status: DC | PRN

## 2023-02-10 MED ORDER — CHLORHEXIDINE GLUCONATE 4 % EX LIQD: 60.0000 mL | Freq: Once | CUTANEOUS | Status: DC

## 2023-02-10 MED ORDER — PHENYLEPHRINE 80 MCG/ML (10ML) SYRINGE FOR IV PUSH (FOR BLOOD PRESSURE SUPPORT)
PREFILLED_SYRINGE | INTRAVENOUS | Status: AC
  Filled 2023-02-10: qty 10

## 2023-02-10 MED ORDER — PHENYLEPHRINE 80 MCG/ML (10ML) SYRINGE FOR IV PUSH (FOR BLOOD PRESSURE SUPPORT)
PREFILLED_SYRINGE | INTRAVENOUS | Status: DC | PRN
  Administered 2023-02-10 (×8): 80 ug via INTRAVENOUS

## 2023-02-10 MED ORDER — MIDAZOLAM HCL 5 MG/5ML IJ SOLN
INTRAMUSCULAR | Status: DC | PRN
  Administered 2023-02-10: 1 mg via INTRAVENOUS

## 2023-02-10 MED ORDER — METHOCARBAMOL 1000 MG/10ML IJ SOLN
500.0000 mg | Freq: Three times a day (TID) | INTRAVENOUS | Status: DC | PRN
  Administered 2023-02-10: 500 mg via INTRAVENOUS
  Filled 2023-02-10: qty 500

## 2023-02-10 MED ORDER — CHLORHEXIDINE GLUCONATE 0.12 % MT SOLN
15.0000 mL | Freq: Once | OROMUCOSAL | Status: AC
  Administered 2023-02-10: 15 mL via OROMUCOSAL
  Filled 2023-02-10 (×2): qty 15

## 2023-02-10 MED ORDER — ONDANSETRON HCL 4 MG/2ML IJ SOLN: 4.0000 mg | Freq: Once | INTRAMUSCULAR | Status: DC | PRN

## 2023-02-10 MED ORDER — VASOPRESSIN 20 UNIT/ML IV SOLN
INTRAVENOUS | Status: DC | PRN
  Administered 2023-02-10: 1 [IU] via INTRAVENOUS

## 2023-02-10 MED ORDER — STERILE WATER FOR IRRIGATION IR SOLN
Status: DC | PRN
  Administered 2023-02-10: 1000 mL

## 2023-02-10 MED ORDER — ENOXAPARIN SODIUM 40 MG/0.4ML IJ SOSY
40.0000 mg | PREFILLED_SYRINGE | INTRAMUSCULAR | Status: DC
  Administered 2023-02-11 – 2023-02-13 (×3): 40 mg via SUBCUTANEOUS
  Filled 2023-02-10 (×3): qty 0.4

## 2023-02-10 MED ORDER — LIDOCAINE 2% (20 MG/ML) 5 ML SYRINGE
INTRAMUSCULAR | Status: AC
  Filled 2023-02-10: qty 5

## 2023-02-10 MED ORDER — PROPOFOL 10 MG/ML IV BOLUS
INTRAVENOUS | Status: DC | PRN
  Administered 2023-02-10: 200 mg via INTRAVENOUS

## 2023-02-10 MED ORDER — ONDANSETRON HCL 4 MG/2ML IJ SOLN
INTRAMUSCULAR | Status: DC | PRN
  Administered 2023-02-10: 4 mg via INTRAVENOUS

## 2023-02-10 MED ORDER — POLYETHYLENE GLYCOL 3350 17 G PO PACK: 17.0000 g | PACK | Freq: Every day | ORAL | Status: DC | PRN

## 2023-02-10 MED ORDER — FENTANYL CITRATE (PF) 250 MCG/5ML IJ SOLN
INTRAMUSCULAR | Status: AC
  Filled 2023-02-10: qty 5

## 2023-02-10 MED ORDER — ONDANSETRON HCL 4 MG/2ML IJ SOLN
INTRAMUSCULAR | Status: AC
  Filled 2023-02-10: qty 2

## 2023-02-10 MED ORDER — TRANEXAMIC ACID-NACL 1000-0.7 MG/100ML-% IV SOLN
1000.0000 mg | Freq: Once | INTRAVENOUS | Status: AC
  Administered 2023-02-10: 1000 mg via INTRAVENOUS
  Filled 2023-02-10: qty 100

## 2023-02-10 MED ORDER — ORAL CARE MOUTH RINSE: 15.0000 mL | Freq: Once | OROMUCOSAL | Status: AC

## 2023-02-10 MED ORDER — CEFAZOLIN SODIUM-DEXTROSE 2-4 GM/100ML-% IV SOLN
2.0000 g | INTRAVENOUS | Status: AC
  Administered 2023-02-10: 2 g via INTRAVENOUS
  Filled 2023-02-10: qty 100

## 2023-02-10 MED ORDER — HYDROMORPHONE HCL 1 MG/ML IJ SOLN
1.0000 mg | INTRAMUSCULAR | Status: DC | PRN
  Administered 2023-02-10 – 2023-02-13 (×10): 1 mg via INTRAVENOUS
  Filled 2023-02-10 (×10): qty 1

## 2023-02-10 MED ORDER — FENTANYL CITRATE (PF) 100 MCG/2ML IJ SOLN
25.0000 ug | INTRAMUSCULAR | Status: DC | PRN
  Administered 2023-02-10: 25 ug via INTRAVENOUS

## 2023-02-10 MED ORDER — POVIDONE-IODINE 10 % EX SWAB: 2.0000 | Freq: Once | CUTANEOUS | Status: DC

## 2023-02-10 MED ORDER — CEFAZOLIN SODIUM-DEXTROSE 2-4 GM/100ML-% IV SOLN
2.0000 g | Freq: Three times a day (TID) | INTRAVENOUS | Status: AC
  Administered 2023-02-10 – 2023-02-11 (×3): 2 g via INTRAVENOUS
  Filled 2023-02-10 (×3): qty 100

## 2023-02-10 MED ORDER — ACETAMINOPHEN 325 MG PO TABS
650.0000 mg | ORAL_TABLET | Freq: Four times a day (QID) | ORAL | Status: AC
  Administered 2023-02-10 – 2023-02-11 (×3): 650 mg via ORAL
  Filled 2023-02-10 (×3): qty 2

## 2023-02-10 MED ORDER — DOCUSATE SODIUM 100 MG PO CAPS
100.0000 mg | ORAL_CAPSULE | Freq: Two times a day (BID) | ORAL | Status: DC
  Administered 2023-02-10 – 2023-02-13 (×6): 100 mg via ORAL
  Filled 2023-02-10 (×6): qty 1

## 2023-02-10 MED ORDER — ACETAMINOPHEN 500 MG PO TABS
1000.0000 mg | ORAL_TABLET | Freq: Once | ORAL | Status: AC
  Administered 2023-02-10: 1000 mg via ORAL
  Filled 2023-02-10: qty 2

## 2023-02-10 MED ORDER — PHENYLEPHRINE HCL-NACL 20-0.9 MG/250ML-% IV SOLN
INTRAVENOUS | Status: DC | PRN
  Administered 2023-02-10: 40 ug/min via INTRAVENOUS

## 2023-02-10 MED ORDER — FENTANYL CITRATE (PF) 250 MCG/5ML IJ SOLN
INTRAMUSCULAR | Status: DC | PRN
  Administered 2023-02-10: 50 ug via INTRAVENOUS

## 2023-02-10 SURGICAL SUPPLY — 47 items
BAG COUNTER SPONGE SURGICOUNT (BAG) IMPLANT
BIT DRILL INTERTAN LAG SCREW (BIT) IMPLANT
BIT DRILL LONG 4.0 (BIT) IMPLANT
BRUSH SCRUB EZ PLAIN DRY (MISCELLANEOUS) ×2 IMPLANT
CHLORAPREP W/TINT 26 (MISCELLANEOUS) ×1 IMPLANT
COVER PERINEAL POST (MISCELLANEOUS) ×1 IMPLANT
COVER SURGICAL LIGHT HANDLE (MISCELLANEOUS) ×1 IMPLANT
DERMABOND ADVANCED .7 DNX12 (GAUZE/BANDAGES/DRESSINGS) ×1 IMPLANT
DRAPE C-ARM 35X43 STRL (DRAPES) ×1 IMPLANT
DRAPE IMP U-DRAPE 54X76 (DRAPES) ×2 IMPLANT
DRAPE INCISE IOBAN 66X45 STRL (DRAPES) ×1 IMPLANT
DRAPE STERI IOBAN 125X83 (DRAPES) ×1 IMPLANT
DRAPE SURG 17X23 STRL (DRAPES) ×2 IMPLANT
DRAPE U-SHAPE 47X51 STRL (DRAPES) ×1 IMPLANT
DRESSING MEPILEX FLEX 4X4 (GAUZE/BANDAGES/DRESSINGS) IMPLANT
DRILL BIT LONG 4.0 (BIT) ×1
DRSG MEPILEX BORDER 4X4 (GAUZE/BANDAGES/DRESSINGS) ×1 IMPLANT
DRSG MEPILEX BORDER 4X8 (GAUZE/BANDAGES/DRESSINGS) ×1 IMPLANT
DRSG MEPILEX FLEX 4X4 (GAUZE/BANDAGES/DRESSINGS) ×1
DRSG MEPILEX POST OP 4X8 (GAUZE/BANDAGES/DRESSINGS) IMPLANT
ELECT REM PT RETURN 9FT ADLT (ELECTROSURGICAL)
ELECTRODE REM PT RTRN 9FT ADLT (ELECTROSURGICAL) ×1 IMPLANT
GLOVE BIO SURGEON STRL SZ 6.5 (GLOVE) ×3 IMPLANT
GLOVE BIO SURGEON STRL SZ7.5 (GLOVE) ×4 IMPLANT
GLOVE BIOGEL PI IND STRL 6.5 (GLOVE) ×1 IMPLANT
GLOVE BIOGEL PI IND STRL 7.5 (GLOVE) ×1 IMPLANT
GOWN STRL REUS W/ TWL LRG LVL3 (GOWN DISPOSABLE) ×1 IMPLANT
GOWN STRL REUS W/TWL LRG LVL3 (GOWN DISPOSABLE) ×1
GUIDE PIN 3.2X343 (PIN) ×2
GUIDE PIN 3.2X343MM (PIN) ×2
KIT BASIN OR (CUSTOM PROCEDURE TRAY) ×1 IMPLANT
KIT TURNOVER KIT B (KITS) ×1 IMPLANT
MANIFOLD NEPTUNE II (INSTRUMENTS) ×1 IMPLANT
NAIL INTERTAN 10X18 130D 10S (Nail) IMPLANT
NS IRRIG 1000ML POUR BTL (IV SOLUTION) ×1 IMPLANT
PACK GENERAL/GYN (CUSTOM PROCEDURE TRAY) ×1 IMPLANT
PAD ARMBOARD 7.5X6 YLW CONV (MISCELLANEOUS) ×2 IMPLANT
PIN GUIDE 3.2X343MM (PIN) IMPLANT
SCREW LAG COMPR KIT 120/115 (Screw) IMPLANT
SCREW TRIGEN LOW PROF 5.0X40 (Screw) IMPLANT
SUT MNCRL AB 3-0 PS2 18 (SUTURE) ×1 IMPLANT
SUT VIC AB 0 CT1 27 (SUTURE)
SUT VIC AB 0 CT1 27XBRD ANBCTR (SUTURE) IMPLANT
SUT VIC AB 2-0 CT1 27 (SUTURE) ×1
SUT VIC AB 2-0 CT1 TAPERPNT 27 (SUTURE) ×2 IMPLANT
TOWEL GREEN STERILE (TOWEL DISPOSABLE) ×2 IMPLANT
WATER STERILE IRR 1000ML POUR (IV SOLUTION) ×1 IMPLANT

## 2023-02-10 NOTE — Telephone Encounter (Signed)
His health status prevents him from surgery at [facility].

## 2023-02-10 NOTE — Op Note (Signed)
Orthopaedic Surgery Operative Note (CSN: [id number] ) Date of Surgery: 02/10/2023  Admit Date: 02/09/2023   Diagnoses: Pre-Op Diagnoses: Left intertrochanteric femur fracture  Post-Op Diagnosis: Same  Procedures: CPT 27245-Cephalomedullary nailing of left intertrochanteric femur fracture  Surgeons : Primary: Shona Needles, MD  Assistant: Patrecia Pace, PA-C  Location: OR 3   Anesthesia: General   Antibiotics: Ancef 2g preop   Tourniquet time:None    Estimated Blood Loss: 75 mL  Complications:None   Specimens:None   Implants: Implant Name Type Inv. Item Serial No. Manufacturer Lot No. LRB No. Used Action  SCREW LAG COMPR KIT 120/115 - [id number] Screw SCREW LAG COMPR KIT 120/115  [facility] [employer] [id number] Left 1 Implanted  NAIL INTERTAN 10X18 130D 10S - [id number] Nail NAIL INTERTAN 10X18 130D 10S  [employer] [id number] Left 1 Implanted  SCREW TRIGEN LOW PROF 5.0X40 - [id number] Screw SCREW TRIGEN LOW PROF 5.0X40  [employer] [id number] Left 1 Implanted     Indications for Surgery: 77 year old male who sustained a left intertrochanteric femur fracture.  Due to the unstable nature of his injury I recommend proceeding with cephalomedullary nailing of the left hip.  Risk and benefits were discussed with the patient.  Risks include but not limited to bleeding, infection, malunion, nonunion, hardware failure, hardware irritation, nerve or blood vessel injury, DVT, even the possibility anesthetic complications.  He agreed to proceed with surgery and consent was obtained.  Operative Findings: Cephalomedullary nailing of left intertrochanteric femur fracture using [employer] InterTAN 10 x 180 mm nail with a 120 mm lag screw and 115 mm compression screw.  Procedure: The patient was identified in the preoperative holding area. Consent was confirmed with the patient and their family and all questions were answered. The operative  extremity was marked after confirmation with the patient. he was then brought back to the operating room by our anesthesia colleagues.  He was placed under general anesthetic and carefully transferred over to the [facility] table.  All bony prominences were well-padded.  Fluoroscopic imaging was shown to show the unstable nature of his injury.  Traction was applied to the left lower extremity and fracture alignment was obtained.  The left lower extremity was then prepped and draped in usual sterile fashion.  A timeout was performed to verify the patient, the procedure, and the extremity.  Preoperative antibiotics were dosed.  A small incision proximal to the greater trochanter was made and carried down through skin and subcutaneous tissue.  A threaded guidewire was placed in the tip of the greater trochanter and advanced into the proximal metaphysis.  I then used an entry reamer to enter the medullary canal.  I then attached a 10 x 180 mm nail to a targeting arm and placed down the center of the canal until it was seated appropriately on the fluoroscopic imaging.  I then used the targeting arm to place a threaded guidewire up into the head/neck segment of the femur.  I confirmed adequate tip apex distance using AP and lateral fluoroscopic guidance.  I then drilled the path for the compression screw and placed an antirotation bar.  I then drilled and placed the lag screw and then I used the compression screw to compress approximately 5 to 7 mm.  I statically locked the proximal portion of the nail and then I drilled and placed a distal interlocking screw from lateral to medial.  The targeting arm was removed.  Final fluoroscopic imaging was obtained.  The  incisions were copiously irrigated.  A layered closure with 2-0 Vicryl and 3-0 Monocryl with Dermabond was used to close the skin.  Sterile dressings were applied.  The patient was then awoken from anesthesia and taken to the PACU in stable condition.  Post Op  Plan/Instructions: The patient be weightbearing as tolerated to the left lower extremity.  He will receive postoperative Ancef.  He will be started on Lovenox for DVT prophylaxis and discharged home on a DOAC.  We will have him mobilize with physical and Occupational Therapy.  I was present and performed the entire surgery.  Patrecia Pace, PA-C did assist me throughout the case. An assistant was necessary given the difficulty in approach, maintenance of reduction and ability to instrument the fracture.   Katha Hamming, MD [facility]

## 2023-02-10 NOTE — Telephone Encounter (Signed)
I called his wife to advise.  Left message

## 2023-02-10 NOTE — Anesthesia Preprocedure Evaluation (Addendum)
Anesthesia Evaluation  Patient identified by MRN, date of birth, ID band Patient awake    Reviewed: Allergy & Precautions, NPO status , Patient's Chart, lab work & pertinent test results, reviewed documented beta blocker date and time   Airway Mallampati: II  TM Distance: >3 FB Neck ROM: Full    Dental  (+) Dental Advisory Given   Pulmonary sleep apnea and Continuous Positive Airway Pressure Ventilation , former smoker   Pulmonary exam normal        Cardiovascular hypertension, Pt. on home beta blockers and Pt. on medications Normal cardiovascular exam     Neuro/Psych  Headaches PSYCHIATRIC DISORDERS Anxiety Depression     Hearing loss Parkinsonism   Neuromuscular disease    GI/Hepatic negative GI ROS, Neg liver ROS,,,  Endo/Other   Pre-DM   Renal/GU Renal disease     Musculoskeletal  (+) Arthritis ,    Abdominal   Peds  Hematology  (+) Blood dyscrasia, anemia   Anesthesia Other Findings BLADDER TUMOR  Reproductive/Obstetrics                             Anesthesia Physical Anesthesia Plan  ASA: 3  Anesthesia Plan: General   Post-op Pain Management: Tylenol PO (pre-op)*   Induction: Intravenous  PONV Risk Score and Plan: 3 and Ondansetron, Dexamethasone and Treatment may vary due to age or medical condition  Airway Management Planned: LMA  Additional Equipment: None  Intra-op Plan:   Post-operative Plan: Extubation in OR  Informed Consent: I have reviewed the patients History and Physical, chart, labs and discussed the procedure including the risks, benefits and alternatives for the proposed anesthesia with the patient or authorized representative who has indicated his/her understanding and acceptance.     Dental advisory given  Plan Discussed with: CRNA and Anesthesiologist  Anesthesia Plan Comments:         Anesthesia Quick Evaluation

## 2023-02-10 NOTE — Progress Notes (Signed)
Patient seen and examined; chart review.  77 year old male with past medical history significant for chronic pain, depression/anxiety, hypertension, obstructive sleep apnea, prediabetes, balance disturbances, and prior history of bladder cancer; who presented to the hospital after losing balance and experiencing mechanical fall.  Patient's fall resulted in left intertrochanteric hip fracture that will require surgical repair.  While non-mobile patient reports good pain control and is hemodynamically stable.  Please refer to H&P written by Dr.Zierle-ghosh on 02/10/23 for vomiting/details on admission.  Plan: -Patient will be transferred as requested by Dr. Kathaleen Bury (orthopedic service) to [facility] for surgical repair. -Hospitalist service will continue providing supportive care and attended patient's medical problems. -Continue n.p.o. status for now, with exceptions on his meds. -Continue the use of a statin and current antihypertensive agents. -Continue as needed analgesics.  Barton Dubois MD [phone]

## 2023-02-10 NOTE — Anesthesia Postprocedure Evaluation (Signed)
Anesthesia Post Note  Patient: Zachary [name]  Procedure(s) Performed: LEFT INTRAMEDULLARY (IM) NAILING OF LEFT FEMUR (Left: Leg Upper)     Patient location during evaluation: PACU Anesthesia Type: General Level of consciousness: awake Pain management: pain level controlled Vital Signs Assessment: post-procedure vital signs reviewed and stable Respiratory status: spontaneous breathing, nonlabored ventilation and respiratory function stable Cardiovascular status: blood pressure returned to baseline and stable Postop Assessment: no apparent nausea or vomiting Anesthetic complications: no   There were no known notable events for this encounter.  Last Vitals:  Vitals:   02/10/23 1730 02/10/23 1745  BP: (!) 132/94 133/64  Pulse: 93 86  Resp: 18 12  Temp:    SpO2: 93% 93%    Last Pain:  Vitals:   02/10/23 1730  TempSrc:   PainSc: 6                  Nilda Simmer

## 2023-02-10 NOTE — Consult Note (Signed)
Reason for Consult:Left hip fx Referring Physician: Somalia Zierle-Ghosh Time called: 1233 Time at bedside: Zachary [name] is an 77 y.o. male.  HPI: Zachary [name] was horsing around at church when he lost his balance and fell. He had immediate left hip pain and could not get up. He was brought to [facility] where x-rays showed a left hip fx. Orthopedic surgery was consulted and he was transferred to [facility] for definitive care. He is retired and generally ambulates with a cane. He lives at home with his wife.  Past Medical History:  Diagnosis Date   Anxiety    takes Valium as needed   Arthritis    Bladder cancer ([place])    takes Rapaflo daily   Blood dyscrasia    07/08/16: pt told he was a "free bleeder" after bleeding a lot when derm cut off mole on forehead. No excessive bleeding with minor wounds at home, no bleeding problems perioperatively with prior surgeries.   Chronic back pain    spondylolisthesis   Cluster headaches    Depression    takes Lexapro daily   Essential hypertension, benign    takes Diovan-HCT daily   Hearing loss    hearing aids   History of kidney stones    Hx of complications due to general anesthesia    Aborted surgery 07/15/2016 due to hypotension per pt   Joint pain    Kidney stone 03/2019   passed stone   Obstructive sleep apnea on CPAP    uses CPAP @ night   Parkinsonism    Pneumonia    "several times"--last time about 3-4 yrs ago   Pre-diabetes    Prediabetes    Syncopal episodes    Thyroid condition     Past Surgical History:  Procedure Laterality Date   ANTERIOR CERVICAL DECOMP/DISCECTOMY FUSION N/A 06/05/2013   Procedure:  C3-4 Anterior Cervical Discectomy and Fusion, Allograft, Plate;  Surgeon: [name], MD;  Location: [facility];  Service: Orthopedics;  Laterality: N/A;  C3-4 Anterior Cervical Discectomy and Fusion, Allograft, Plate   Arthroscopic knee surgery     BACK SURGERY      x 2   BLADDER SURGERY     x 5 to remove tumor   CARPAL TUNNEL RELEASE      CATARACT EXTRACTION W/PHACO  12/19/2012   Procedure: CATARACT EXTRACTION PHACO AND INTRAOCULAR LENS PLACEMENT ([place]);  Surgeon: [name], MD;  Location: [facility];  Service: Ophthalmology;  Laterality: Left;  CDE: 26.80   CATARACT EXTRACTION W/PHACO  01/09/2013   Procedure: CATARACT EXTRACTION PHACO AND INTRAOCULAR LENS PLACEMENT ([facility]);  Surgeon: [name], MD;  Location: [facility];  Service: Ophthalmology;  Laterality: Right;  CDE:22.83   CERVICAL FUSION  06/05/2013   C 3  C4   CYSTOSCOPY     CYSTOSCOPY W/ URETERAL STENT PLACEMENT Bilateral 02/12/2020   Procedure: CYSTOSCOPY WITH RETROGRADE PYELOGRAM/URETERAL STENT PLACEMENT;  Surgeon: Irine Seal, MD;  Location: [facility];  Service: Urology;  Laterality: Bilateral;   LITHOTRIPSY  02/2020   REFRACTIVE SURGERY     Hx: of   RETINAL DETACHMENT SURGERY     TOTAL KNEE ARTHROPLASTY Left 02/01/2018   Procedure: TOTAL KNEE ARTHROPLASTY;  Surgeon: Carole Civil, MD;  Location: [facility];  Service: Orthopedics;  Laterality: Left;   TRANSURETHRAL RESECTION OF BLADDER TUMOR Right 07/27/2015   Procedure: TRANSURETHRAL RESECTION OF BLADDER TUMOR (TURBT);  Surgeon: [name], MD;  Location: [facility];  Service: Urology;  Laterality: Right;  TRANSURETHRAL RESECTION OF BLADDER TUMOR WITH MITOMYCIN-C N/A 01/25/2023   Procedure: TRANSURETHRAL RESECTION OF BLADDER TUMOR WITH GEMCITABINE;  Surgeon: Lucas Mallow, MD;  Location: [facility];  Service: Urology;  Laterality: N/A;   wisdom tooth extraction      Family History  Problem Relation Age of Onset   Hypertension Father    Cancer Father        Prostate   Depression Mother    Hypertension Mother    Alcohol abuse Mother    COPD Mother        passed 07-08-17   Hypertension Sister    Anxiety disorder Sister    Alcohol abuse Maternal Grandfather    ADD / ADHD Neg Hx    Bipolar disorder Neg Hx    Dementia Neg Hx    Drug abuse Neg Hx    OCD Neg Hx    Paranoid behavior Neg Hx    Schizophrenia Neg Hx     Seizures Neg Hx    Sexual abuse Neg Hx    Physical abuse Neg Hx     Social History:  reports that he quit smoking about 46 years ago. His smoking use included cigarettes and cigars. He has a 0.25 pack-year smoking history. He has never used smokeless tobacco. He reports that he does not drink alcohol and does not use drugs.  Allergies:  Allergies  Allergen Reactions   Poison Oak Extract Hives and Itching   Sulfonamide Derivatives Itching and Rash   Sulfa Antibiotics     Medications: I have reviewed the patient's current medications.  Results for orders placed or performed during the hospital encounter of 02/09/23 (from the past 48 hour(s))  Basic metabolic panel     Status: Abnormal   Collection Time: 02/09/23  8:02 PM  Result Value Ref Range   Sodium 141 135 - 145 mmol/L   Potassium 3.6 3.5 - 5.1 mmol/L   Chloride 105 98 - 111 mmol/L   CO2 25 22 - 32 mmol/L   Glucose, Bld 96 70 - 99 mg/dL    Comment: Glucose reference range applies only to samples taken after fasting for at least 8 hours.   BUN 27 (H) 8 - 23 mg/dL   Creatinine, Ser 1.82 (H) 0.61 - 1.24 mg/dL   Calcium 8.5 (L) 8.9 - 10.3 mg/dL   GFR, Estimated 38 (L) >60 mL/min    Comment: (NOTE) Calculated using the CKD-EPI Creatinine Equation (2021)    Anion gap 11 5 - 15    Comment: Performed at [facility], [address], [place], [place] [address]  CBC with Differential     Status: Abnormal   Collection Time: 02/09/23  8:02 PM  Result Value Ref Range   WBC 9.4 4.0 - 10.5 K/uL   RBC 4.20 (L) 4.22 - 5.81 MIL/uL   Hemoglobin 12.4 (L) 13.0 - 17.0 g/dL   HCT 39.1 39.0 - 52.0 %   MCV 93.1 80.0 - 100.0 fL   MCH 29.5 26.0 - 34.0 pg   MCHC 31.7 30.0 - 36.0 g/dL   RDW 13.5 11.5 - 15.5 %   Platelets 235 150 - 400 K/uL   nRBC 0.0 0.0 - 0.2 %   Neutrophils Relative % 75 %   Neutro Abs 7.1 1.7 - 7.7 K/uL   Lymphocytes Relative 16 %   Lymphs Abs 1.5 0.7 - 4.0 K/uL   Monocytes Relative 5 %   Monocytes Absolute 0.5 0.1 -  1.0 K/uL   Eosinophils  Relative 2 %   Eosinophils Absolute 0.2 0.0 - 0.5 K/uL   Basophils Relative 1 %   Basophils Absolute 0.1 0.0 - 0.1 K/uL   Immature Granulocytes 1 %   Abs Immature Granulocytes 0.08 (H) 0.00 - 0.07 K/uL    Comment: Performed at [facility], [address] [place], [place] [address]  Protime-INR     Status: None   Collection Time: 02/09/23  8:02 PM  Result Value Ref Range   Prothrombin Time 13.3 11.4 - 15.2 seconds   INR 1.0 0.8 - 1.2    Comment: (NOTE) INR goal varies based on device and disease states. Performed at [facility], [address], [place], [place] [address]   Type and screen [facility]     Status: None   Collection Time: 02/09/23  8:02 PM  Result Value Ref Range   ABO/RH(D) O POS    Antibody Screen NEG    Sample Expiration      02/12/2023,2359 Performed at [facility], [address] [place], [place] [address]   Comprehensive metabolic panel     Status: Abnormal   Collection Time: 02/10/23  4:26 AM  Result Value Ref Range   Sodium 141 135 - 145 mmol/L   Potassium 3.8 3.5 - 5.1 mmol/L   Chloride 109 98 - 111 mmol/L   CO2 24 22 - 32 mmol/L   Glucose, Bld 116 (H) 70 - 99 mg/dL    Comment: Glucose reference range applies only to samples taken after fasting for at least 8 hours.   BUN 26 (H) 8 - 23 mg/dL   Creatinine, Ser 1.59 (H) 0.61 - 1.24 mg/dL   Calcium 8.0 (L) 8.9 - 10.3 mg/dL   Total Protein 6.1 (L) 6.5 - 8.1 g/dL   Albumin 3.0 (L) 3.5 - 5.0 g/dL   AST 15 15 - 41 U/L   ALT 9 0 - 44 U/L   Alkaline Phosphatase 77 38 - 126 U/L   Total Bilirubin 0.8 0.3 - 1.2 mg/dL   GFR, Estimated 45 (L) >60 mL/min    Comment: (NOTE) Calculated using the CKD-EPI Creatinine Equation (2021)    Anion gap 8 5 - 15    Comment: Performed at [facility], [address] [place], [place] [address]  Magnesium     Status: Abnormal   Collection Time: 02/10/23  4:26 AM  Result Value Ref Range   Magnesium 1.6 (L) 1.7 - 2.4 mg/dL    Comment:  Performed at [facility], [address], [place], [place] [address]  CBC with Differential/Platelet     Status: Abnormal   Collection Time: 02/10/23  4:26 AM  Result Value Ref Range   WBC 12.3 (H) 4.0 - 10.5 K/uL   RBC 3.68 (L) 4.22 - 5.81 MIL/uL   Hemoglobin 10.9 (L) 13.0 - 17.0 g/dL   HCT 34.1 (L) 39.0 - 52.0 %   MCV 92.7 80.0 - 100.0 fL   MCH 29.6 26.0 - 34.0 pg   MCHC 32.0 30.0 - 36.0 g/dL   RDW 13.5 11.5 - 15.5 %   Platelets 219 150 - 400 K/uL   nRBC 0.0 0.0 - 0.2 %   Neutrophils Relative % 84 %   Neutro Abs 10.4 (H) 1.7 - 7.7 K/uL   Lymphocytes Relative 10 %   Lymphs Abs 1.2 0.7 - 4.0 K/uL   Monocytes Relative 5 %   Monocytes Absolute 0.6 0.1 - 1.0 K/uL   Eosinophils Relative 0 %   Eosinophils  Absolute 0.0 0.0 - 0.5 K/uL   Basophils Relative 0 %   Basophils Absolute 0.0 0.0 - 0.1 K/uL   Immature Granulocytes 1 %   Abs Immature Granulocytes 0.07 0.00 - 0.07 K/uL    Comment: Performed at [facility], [address], [place], [place] [address]    CT CERVICAL SPINE WO CONTRAST  Result Date: 02/09/2023 CLINICAL DATA:  Poly trauma blunt. [employer]. Hematoma to the back of the head. EXAM: CT CERVICAL SPINE WITHOUT CONTRAST TECHNIQUE: Multidetector CT imaging of the cervical spine was performed without intravenous contrast. Multiplanar CT image reconstructions were also generated. RADIATION DOSE REDUCTION: This exam was performed according to the departmental dose-optimization program which includes automated exposure control, adjustment of the mA and/or kV according to patient size and/or use of iterative reconstruction technique. COMPARISON:  CT 08/26/2021.  MRI 01/20/2023 FINDINGS: Alignment: Normal alignment. Skull base and vertebrae: Skull base appears intact. No vertebral compression deformities. No focal bone lesion or bone destruction. Soft tissues and spinal canal: No prevertebral soft tissue swelling. No abnormal paraspinal soft tissue mass or infiltration. Disc levels:  Degenerative changes throughout with narrowed disc spaces and endplate osteophyte formation. Bridging osteophytes in the lower cervical region. Postoperative changes with anterior plate and screw fixation and intervertebral fusion at C3-4. Degenerative changes in the facet joints. Upper chest: Lung apices are clear. Other: None. IMPRESSION: Normal alignment. Postoperative and degenerative changes as discussed. No acute displaced fractures are identified. Electronically Signed   By: Lucienne Capers M.D.   On: 02/09/2023 20:43   CT HEAD WO CONTRAST  Result Date: 02/09/2023 CLINICAL DATA:  Poly trauma, blunt. Fell at [employer]. Hematoma to the back of the head. Patient is on blood thinners. No loss of consciousness. EXAM: CT HEAD WITHOUT CONTRAST TECHNIQUE: Contiguous axial images were obtained from the base of the skull through the vertex without intravenous contrast. RADIATION DOSE REDUCTION: This exam was performed according to the departmental dose-optimization program which includes automated exposure control, adjustment of the mA and/or kV according to patient size and/or use of iterative reconstruction technique. COMPARISON:  MRI brain 07/19/2021.  CT head 10/06/2012 FINDINGS: Brain: No evidence of acute infarction, hemorrhage, hydrocephalus, extra-axial collection or mass lesion/mass effect. Mild cerebral atrophy. Patchy low-attenuation changes in the deep white matter consistent with small vessel ischemia. Vascular: No hyperdense vessel or unexpected calcification. Skull: Normal. Negative for fracture or focal lesion. Sinuses/Orbits: Paranasal sinuses and mastoid air cells are clear. Other: Subcutaneous scalp hematoma over the posterior vertex. IMPRESSION: No acute intracranial abnormalities. Mild chronic atrophy and small vessel ischemic changes. Electronically Signed   By: Lucienne Capers M.D.   On: 02/09/2023 20:41   DG Pelvis 1-2 Views  Result Date: 02/09/2023 CLINICAL DATA:  [place] at [employer]. Pain and rotation of the left leg. EXAM: PELVIS - 1-2 VIEW COMPARISON:  Abdominal radiograph 04/10/2020 FINDINGS: Acute comminuted fracture of the inter trochanteric left proximal femur with displaced lesser trochanteric fragment and varus angulation of the fracture fragments. Degenerative changes in the bilateral hips. Pelvis and sacrum appear intact. Postoperative and degenerative changes in the lower lumbar spine. IMPRESSION: Acute comminuted fracture of the inter trochanteric left femur with varus angulation. Electronically Signed   By: Lucienne Capers M.D.   On: 02/09/2023 20:38   DG Chest 1 View  Result Date: 02/09/2023 CLINICAL DATA:  Preoperative.  Fell at [employer]. EXAM: CHEST  1 VIEW COMPARISON:  11/13/2022 FINDINGS: Shallow inspiration. Heart size and pulmonary vascularity are normal. Lungs are  clear. No pleural effusions. No pneumothorax. Mediastinal contours appear intact. Degenerative changes in the spine and shoulders. IMPRESSION: No active disease. Electronically Signed   By: Lucienne Capers M.D.   On: 02/09/2023 20:37   DG FEMUR MIN 2 VIEWS LEFT  Result Date: 02/09/2023 CLINICAL DATA:  [place] at [employer]. Pain and rotation of the left leg. EXAM: LEFT FEMUR 2 VIEWS COMPARISON:  None Available. FINDINGS: Acute comminuted inter trochanteric fractures of the left hip with mild varus angulation. Displaced lesser trochanteric fragment. Degenerative changes in the left hip without dislocation. Visualized left hemipelvis and the remainder of the femur appear intact. Postoperative left knee arthroplasty. Vascular calcifications. Postoperative and degenerative changes in the lower lumbar spine. IMPRESSION: Acute comminuted inter trochanteric fractures of the left proximal femur with varus angulation. Electronically Signed   By: Lucienne Capers M.D.   On: 02/09/2023 20:36    Review of Systems  HENT:  Negative for ear discharge, ear pain, hearing loss and tinnitus.   Eyes:   Negative for photophobia and pain.  Respiratory:  Negative for cough and shortness of breath.   Cardiovascular:  Negative for chest pain.  Gastrointestinal:  Negative for abdominal pain, nausea and vomiting.  Genitourinary:  Negative for dysuria, flank pain, frequency and urgency.  Musculoskeletal:  Positive for arthralgias (Left hip). Negative for back pain, myalgias and neck pain.  Neurological:  Negative for dizziness and headaches.  Hematological:  Does not bruise/bleed easily.  Psychiatric/Behavioral:  The patient is not nervous/anxious.    Blood pressure 134/84, pulse (!) 110, temperature 98.1 F (36.7 C), temperature source Oral, resp. rate (!) 29, weight 97.5 kg, SpO2 96 %. Physical Exam Constitutional:      General: He is not in acute distress.    Appearance: He is well-developed. He is not diaphoretic.  HENT:     Head: Normocephalic and atraumatic.  Eyes:     General: No scleral icterus.       Right eye: No discharge.        Left eye: No discharge.     Conjunctiva/sclera: Conjunctivae normal.  Cardiovascular:     Rate and Rhythm: Normal rate and regular rhythm.  Pulmonary:     Effort: Pulmonary effort is normal. No respiratory distress.  Musculoskeletal:     Cervical back: Normal range of motion.     Comments: LLE No traumatic wounds, ecchymosis, or rash  Mod TTP hip  No knee or ankle effusion  Knee stable to varus/ valgus and anterior/posterior stress  Sens DPN, SPN, TN intact  Motor EHL, ext, flex, evers 5/5  DP 1+, PT 1+, No significant edema  Skin:    General: Skin is warm and dry.  Neurological:     Mental Status: He is alert.  Psychiatric:        Mood and Affect: Mood normal.        Behavior: Behavior normal.     Assessment/Plan: Left hip fx -- Plan IMN with Dr. Doreatha Martin today. Please keep NPO. Multiple medical problems including anxiety, arthritis, chronic pain, depression, hypertension, obstructive sleep apnea, and syncopal episodes -- per primary  service    Lisette Abu, PA-C Orthopedic Surgery [phone] 02/10/2023, 12:42 PM

## 2023-02-10 NOTE — Interval H&P Note (Signed)
History and Physical Interval Note:  02/10/2023 2:53 PM  Zachary [name]  has presented today for surgery, with the diagnosis of Left Intertrochanteric Fracture.  The various methods of treatment have been discussed with the patient and family. After consideration of risks, benefits and other options for treatment, the patient has consented to  Procedure(s): LEFT INTRAMEDULLARY (IM) NAIL INTERTROCHANTERIC (Left) as a surgical intervention.  The patient's history has been reviewed, patient examined, no change in status, stable for surgery.  I have reviewed the patient's chart and labs.  Questions were answered to the patient's satisfaction.     Lennette Bihari P Jazara Swiney

## 2023-02-10 NOTE — H&P (View-Only) (Signed)
Reason for Consult:Left hip fx Referring Physician: Somalia [name] Time called: 1233 Time at bedside: [name] is an 77 y.o. male.  HPI: Zachary [name] was horsing around at church when he lost his balance and fell. He had immediate left hip pain and could not get up. He was brought to [facility] where x-rays showed a left hip fx. Orthopedic surgery was consulted and he was transferred to [facility] for definitive care. He is retired and generally ambulates with a cane. He lives at home with his wife.  Past Medical History:  Diagnosis Date   Anxiety    takes Valium as needed   Arthritis    Bladder cancer ([place])    takes Rapaflo daily   Blood dyscrasia    07/08/16: pt told he was a "free bleeder" after bleeding a lot when derm cut off mole on forehead. No excessive bleeding with minor wounds at home, no bleeding problems perioperatively with prior surgeries.   Chronic back pain    spondylolisthesis   Cluster headaches    Depression    takes Lexapro daily   Essential hypertension, benign    takes Diovan-HCT daily   Hearing loss    hearing aids   History of kidney stones    Hx of complications due to general anesthesia    Aborted surgery 07/15/2016 due to hypotension per pt   Joint pain    Kidney stone 03/2019   passed stone   Obstructive sleep apnea on CPAP    uses CPAP @ night   Parkinsonism    Pneumonia    "several times"--last time about 3-4 yrs ago   Pre-diabetes    Prediabetes    Syncopal episodes    Thyroid condition     Past Surgical History:  Procedure Laterality Date   ANTERIOR CERVICAL DECOMP/DISCECTOMY FUSION N/A 06/05/2013   Procedure:  C3-4 Anterior Cervical Discectomy and Fusion, Allograft, Plate;  Surgeon: Zachary Killings, MD;  Location: [place];  Service: Orthopedics;  Laterality: N/A;  C3-4 Anterior Cervical Discectomy and Fusion, Allograft, Plate   Arthroscopic knee surgery     BACK SURGERY      x 2   BLADDER SURGERY     x 5 to remove tumor   CARPAL TUNNEL RELEASE      CATARACT EXTRACTION W/PHACO  12/19/2012   Procedure: CATARACT EXTRACTION PHACO AND INTRAOCULAR LENS PLACEMENT ([place]);  Surgeon: Zachary Branch, MD;  Location: [facility];  Service: Ophthalmology;  Laterality: Left;  CDE: 26.80   CATARACT EXTRACTION W/PHACO  01/09/2013   Procedure: CATARACT EXTRACTION PHACO AND INTRAOCULAR LENS PLACEMENT ([facility]);  Surgeon: Zachary Branch, MD;  Location: [facility];  Service: Ophthalmology;  Laterality: Right;  CDE:22.83   CERVICAL FUSION  06/05/2013   C 3  C4   CYSTOSCOPY     CYSTOSCOPY W/ URETERAL STENT PLACEMENT Bilateral 02/12/2020   Procedure: CYSTOSCOPY WITH RETROGRADE PYELOGRAM/URETERAL STENT PLACEMENT;  Surgeon: Zachary Seal, MD;  Location: [facility];  Service: Urology;  Laterality: Bilateral;   LITHOTRIPSY  02/2020   REFRACTIVE SURGERY     Hx: of   RETINAL DETACHMENT SURGERY     TOTAL KNEE ARTHROPLASTY Left 02/01/2018   Procedure: TOTAL KNEE ARTHROPLASTY;  Surgeon: Zachary Civil, MD;  Location: [facility];  Service: Orthopedics;  Laterality: Left;   TRANSURETHRAL RESECTION OF BLADDER TUMOR Right 07/27/2015   Procedure: TRANSURETHRAL RESECTION OF BLADDER TUMOR (TURBT);  Surgeon: Zachary Clines, MD;  Location: [facility];  Service: Urology;  Laterality: Right;  TRANSURETHRAL RESECTION OF BLADDER TUMOR WITH MITOMYCIN-C N/A 01/25/2023   Procedure: TRANSURETHRAL RESECTION OF BLADDER TUMOR WITH GEMCITABINE;  Surgeon: Zachary Mallow, MD;  Location: [facility];  Service: Urology;  Laterality: N/A;   wisdom tooth extraction      Family History  Problem Relation Age of Onset   Hypertension Father    Cancer Father        Prostate   Depression Mother    Hypertension Mother    Alcohol abuse Mother    COPD Mother        passed 07-08-17   Hypertension Sister    Anxiety disorder Sister    Alcohol abuse Maternal Grandfather    ADD / ADHD Neg Hx    Bipolar disorder Neg Hx    Dementia Neg Hx    Drug abuse Neg Hx    OCD Neg Hx    Paranoid behavior Neg Hx    Schizophrenia Neg Hx     Seizures Neg Hx    Sexual abuse Neg Hx    Physical abuse Neg Hx     Social History:  reports that he quit smoking about 46 years ago. His smoking use included cigarettes and cigars. He has a 0.25 pack-year smoking history. He has never used smokeless tobacco. He reports that he does not drink alcohol and does not use drugs.  Allergies:  Allergies  Allergen Reactions   Poison Oak Extract Hives and Itching   Sulfonamide Derivatives Itching and Rash   Sulfa Antibiotics     Medications: I have reviewed the patient's current medications.  Results for orders placed or performed during the hospital encounter of 02/09/23 (from the past 48 hour(s))  Basic metabolic panel     Status: Abnormal   Collection Time: 02/09/23  8:02 PM  Result Value Ref Range   Sodium 141 135 - 145 mmol/L   Potassium 3.6 3.5 - 5.1 mmol/L   Chloride 105 98 - 111 mmol/L   CO2 25 22 - 32 mmol/L   Glucose, Bld 96 70 - 99 mg/dL    Comment: Glucose reference range applies only to samples taken after fasting for at least 8 hours.   BUN 27 (H) 8 - 23 mg/dL   Creatinine, Ser 1.82 (H) 0.61 - 1.24 mg/dL   Calcium 8.5 (L) 8.9 - 10.3 mg/dL   GFR, Estimated 38 (L) >60 mL/min    Comment: (NOTE) Calculated using the CKD-EPI Creatinine Equation (2021)    Anion gap 11 5 - 15    Comment: Performed at [facility], [address], [place], [place] [address]  CBC with Differential     Status: Abnormal   Collection Time: 02/09/23  8:02 PM  Result Value Ref Range   WBC 9.4 4.0 - 10.5 K/uL   RBC 4.20 (L) 4.22 - 5.81 MIL/uL   Hemoglobin 12.4 (L) 13.0 - 17.0 g/dL   HCT 39.1 39.0 - 52.0 %   MCV 93.1 80.0 - 100.0 fL   MCH 29.5 26.0 - 34.0 pg   MCHC 31.7 30.0 - 36.0 g/dL   RDW 13.5 11.5 - 15.5 %   Platelets 235 150 - 400 K/uL   nRBC 0.0 0.0 - 0.2 %   Neutrophils Relative % 75 %   Neutro Abs 7.1 1.7 - 7.7 K/uL   Lymphocytes Relative 16 %   Lymphs Abs 1.5 0.7 - 4.0 K/uL   Monocytes Relative 5 %   Monocytes Absolute 0.5 0.1 -  1.0 K/uL   Eosinophils  Relative 2 %   Eosinophils Absolute 0.2 0.0 - 0.5 K/uL   Basophils Relative 1 %   Basophils Absolute 0.1 0.0 - 0.1 K/uL   Immature Granulocytes 1 %   Abs Immature Granulocytes 0.08 (H) 0.00 - 0.07 K/uL    Comment: Performed at [facility], [address], [place], [place] [address]  Protime-INR     Status: None   Collection Time: 02/09/23  8:02 PM  Result Value Ref Range   Prothrombin Time 13.3 11.4 - 15.2 seconds   INR 1.0 0.8 - 1.2    Comment: (NOTE) INR goal varies based on device and disease states. Performed at [facility], [address], [place], [place] [address]   Type and screen [facility]     Status: None   Collection Time: 02/09/23  8:02 PM  Result Value Ref Range   ABO/RH(D) O POS    Antibody Screen NEG    Sample Expiration      02/12/2023,2359 Performed at [facility], [address], [place], [place] [address]   Comprehensive metabolic panel     Status: Abnormal   Collection Time: 02/10/23  4:26 AM  Result Value Ref Range   Sodium 141 135 - 145 mmol/L   Potassium 3.8 3.5 - 5.1 mmol/L   Chloride 109 98 - 111 mmol/L   CO2 24 22 - 32 mmol/L   Glucose, Bld 116 (H) 70 - 99 mg/dL    Comment: Glucose reference range applies only to samples taken after fasting for at least 8 hours.   BUN 26 (H) 8 - 23 mg/dL   Creatinine, Ser 1.59 (H) 0.61 - 1.24 mg/dL   Calcium 8.0 (L) 8.9 - 10.3 mg/dL   Total Protein 6.1 (L) 6.5 - 8.1 g/dL   Albumin 3.0 (L) 3.5 - 5.0 g/dL   AST 15 15 - 41 U/L   ALT 9 0 - 44 U/L   Alkaline Phosphatase 77 38 - 126 U/L   Total Bilirubin 0.8 0.3 - 1.2 mg/dL   GFR, Estimated 45 (L) >60 mL/min    Comment: (NOTE) Calculated using the CKD-EPI Creatinine Equation (2021)    Anion gap 8 5 - 15    Comment: Performed at [facility], [address], [place], [place] [address]  Magnesium     Status: Abnormal   Collection Time: 02/10/23  4:26 AM  Result Value Ref Range   Magnesium 1.6 (L) 1.7 - 2.4 mg/dL    Comment:  Performed at [facility], [address], [place], [place] [address]  CBC with Differential/Platelet     Status: Abnormal   Collection Time: 02/10/23  4:26 AM  Result Value Ref Range   WBC 12.3 (H) 4.0 - 10.5 K/uL   RBC 3.68 (L) 4.22 - 5.81 MIL/uL   Hemoglobin 10.9 (L) 13.0 - 17.0 g/dL   HCT 34.1 (L) 39.0 - 52.0 %   MCV 92.7 80.0 - 100.0 fL   MCH 29.6 26.0 - 34.0 pg   MCHC 32.0 30.0 - 36.0 g/dL   RDW 13.5 11.5 - 15.5 %   Platelets 219 150 - 400 K/uL   nRBC 0.0 0.0 - 0.2 %   Neutrophils Relative % 84 %   Neutro Abs 10.4 (H) 1.7 - 7.7 K/uL   Lymphocytes Relative 10 %   Lymphs Abs 1.2 0.7 - 4.0 K/uL   Monocytes Relative 5 %   Monocytes Absolute 0.6 0.1 - 1.0 K/uL   Eosinophils Relative 0 %   Eosinophils  Absolute 0.0 0.0 - 0.5 K/uL   Basophils Relative 0 %   Basophils Absolute 0.0 0.0 - 0.1 K/uL   Immature Granulocytes 1 %   Abs Immature Granulocytes 0.07 0.00 - 0.07 K/uL    Comment: Performed at [facility], [address] [place], [place] [address]    CT CERVICAL SPINE WO CONTRAST  Result Date: 02/09/2023 CLINICAL DATA:  Poly trauma blunt. [employer]. Hematoma to the back of the head. EXAM: CT CERVICAL SPINE WITHOUT CONTRAST TECHNIQUE: Multidetector CT imaging of the cervical spine was performed without intravenous contrast. Multiplanar CT image reconstructions were also generated. RADIATION DOSE REDUCTION: This exam was performed according to the departmental dose-optimization program which includes automated exposure control, adjustment of the mA and/or kV according to patient size and/or use of iterative reconstruction technique. COMPARISON:  CT 08/26/2021.  MRI 01/20/2023 FINDINGS: Alignment: Normal alignment. Skull base and vertebrae: Skull base appears intact. No vertebral compression deformities. No focal bone lesion or bone destruction. Soft tissues and spinal canal: No prevertebral soft tissue swelling. No abnormal paraspinal soft tissue mass or infiltration. Disc levels:  Degenerative changes throughout with narrowed disc spaces and endplate osteophyte formation. Bridging osteophytes in the lower cervical region. Postoperative changes with anterior plate and screw fixation and intervertebral fusion at C3-4. Degenerative changes in the facet joints. Upper chest: Lung apices are clear. Other: None. IMPRESSION: Normal alignment. Postoperative and degenerative changes as discussed. No acute displaced fractures are identified. Electronically Signed   By: Lucienne Capers M.D.   On: 02/09/2023 20:43   CT HEAD WO CONTRAST  Result Date: 02/09/2023 CLINICAL DATA:  Poly trauma, blunt. Fell at [employer]. Hematoma to the back of the head. Patient is on blood thinners. No loss of consciousness. EXAM: CT HEAD WITHOUT CONTRAST TECHNIQUE: Contiguous axial images were obtained from the base of the skull through the vertex without intravenous contrast. RADIATION DOSE REDUCTION: This exam was performed according to the departmental dose-optimization program which includes automated exposure control, adjustment of the mA and/or kV according to patient size and/or use of iterative reconstruction technique. COMPARISON:  MRI brain 07/19/2021.  CT head 10/06/2012 FINDINGS: Brain: No evidence of acute infarction, hemorrhage, hydrocephalus, extra-axial collection or mass lesion/mass effect. Mild cerebral atrophy. Patchy low-attenuation changes in the deep white matter consistent with small vessel ischemia. Vascular: No hyperdense vessel or unexpected calcification. Skull: Normal. Negative for fracture or focal lesion. Sinuses/Orbits: Paranasal sinuses and mastoid air cells are clear. Other: Subcutaneous scalp hematoma over the posterior vertex. IMPRESSION: No acute intracranial abnormalities. Mild chronic atrophy and small vessel ischemic changes. Electronically Signed   By: Lucienne Capers M.D.   On: 02/09/2023 20:41   DG Pelvis 1-2 Views  Result Date: 02/09/2023 CLINICAL DATA:  [place] at [employer]. Pain and rotation of the left leg. EXAM: PELVIS - 1-2 VIEW COMPARISON:  Abdominal radiograph 04/10/2020 FINDINGS: Acute comminuted fracture of the inter trochanteric left proximal femur with displaced lesser trochanteric fragment and varus angulation of the fracture fragments. Degenerative changes in the bilateral hips. Pelvis and sacrum appear intact. Postoperative and degenerative changes in the lower lumbar spine. IMPRESSION: Acute comminuted fracture of the inter trochanteric left femur with varus angulation. Electronically Signed   By: Lucienne Capers M.D.   On: 02/09/2023 20:38   DG Chest 1 View  Result Date: 02/09/2023 CLINICAL DATA:  Preoperative.  Fell at [employer]. EXAM: CHEST  1 VIEW COMPARISON:  11/13/2022 FINDINGS: Shallow inspiration. Heart size and pulmonary vascularity are normal. Lungs are  clear. No pleural effusions. No pneumothorax. Mediastinal contours appear intact. Degenerative changes in the spine and shoulders. IMPRESSION: No active disease. Electronically Signed   By: Lucienne Capers M.D.   On: 02/09/2023 20:37   DG FEMUR MIN 2 VIEWS LEFT  Result Date: 02/09/2023 CLINICAL DATA:  [place] at [employer]. Pain and rotation of the left leg. EXAM: LEFT FEMUR 2 VIEWS COMPARISON:  None Available. FINDINGS: Acute comminuted inter trochanteric fractures of the left hip with mild varus angulation. Displaced lesser trochanteric fragment. Degenerative changes in the left hip without dislocation. Visualized left hemipelvis and the remainder of the femur appear intact. Postoperative left knee arthroplasty. Vascular calcifications. Postoperative and degenerative changes in the lower lumbar spine. IMPRESSION: Acute comminuted inter trochanteric fractures of the left proximal femur with varus angulation. Electronically Signed   By: Lucienne Capers M.D.   On: 02/09/2023 20:36    Review of Systems  HENT:  Negative for ear discharge, ear pain, hearing loss and tinnitus.   Eyes:   Negative for photophobia and pain.  Respiratory:  Negative for cough and shortness of breath.   Cardiovascular:  Negative for chest pain.  Gastrointestinal:  Negative for abdominal pain, nausea and vomiting.  Genitourinary:  Negative for dysuria, flank pain, frequency and urgency.  Musculoskeletal:  Positive for arthralgias (Left hip). Negative for back pain, myalgias and neck pain.  Neurological:  Negative for dizziness and headaches.  Hematological:  Does not bruise/bleed easily.  Psychiatric/Behavioral:  The patient is not nervous/anxious.    Blood pressure 134/84, pulse (!) 110, temperature 98.1 F (36.7 C), temperature source Oral, resp. rate (!) 29, weight 97.5 kg, SpO2 96 %. Physical Exam Constitutional:      General: He is not in acute distress.    Appearance: He is well-developed. He is not diaphoretic.  HENT:     Head: Normocephalic and atraumatic.  Eyes:     General: No scleral icterus.       Right eye: No discharge.        Left eye: No discharge.     Conjunctiva/sclera: Conjunctivae normal.  Cardiovascular:     Rate and Rhythm: Normal rate and regular rhythm.  Pulmonary:     Effort: Pulmonary effort is normal. No respiratory distress.  Musculoskeletal:     Cervical back: Normal range of motion.     Comments: LLE No traumatic wounds, ecchymosis, or rash  Mod TTP hip  No knee or ankle effusion  Knee stable to varus/ valgus and anterior/posterior stress  Sens DPN, SPN, TN intact  Motor EHL, ext, flex, evers 5/5  DP 1+, PT 1+, No significant edema  Skin:    General: Skin is warm and dry.  Neurological:     Mental Status: He is alert.  Psychiatric:        Mood and Affect: Mood normal.        Behavior: Behavior normal.     Assessment/Plan: Left hip fx -- Plan IMN with Dr. Doreatha Martin today. Please keep NPO. Multiple medical problems including anxiety, arthritis, chronic pain, depression, hypertension, obstructive sleep apnea, and syncopal episodes -- per primary  service    Lisette Abu, PA-C Orthopedic Surgery [phone] 02/10/2023, 12:42 PM

## 2023-02-10 NOTE — Transfer of Care (Signed)
Immediate Anesthesia Transfer of Care Note  Patient: Zachary [name]  Procedure(s) Performed: LEFT INTRAMEDULLARY (IM) NAILING OF LEFT FEMUR (Left: Leg Upper)  Patient Location: PACU  Anesthesia Type:General  Level of Consciousness: drowsy  Airway & Oxygen Therapy: Patient Spontanous Breathing and Patient connected to face mask oxygen  Post-op Assessment: Report given to RN and Post -op Vital signs reviewed and stable  Post vital signs: Reviewed and stable  Last Vitals:  Vitals Value Taken Time  BP 137/74 02/10/23 1700  Temp    Pulse 83 02/10/23 1704  Resp 12 02/10/23 1704  SpO2 100 % 02/10/23 1704  Vitals shown include unvalidated device data.  Last Pain:  Vitals:   02/10/23 1449  TempSrc:   PainSc: 3       Patients Stated Pain Goal: 2 ([id number])  Complications: There were no known notable events for this encounter.

## 2023-02-10 NOTE — Telephone Encounter (Signed)
Is Dr Aline Brochure on call ? I do not have a call schedule

## 2023-02-10 NOTE — H&P (Signed)
History and Physical    Patient: Zachary [name] [id number] DOB: Jan 23, 1946 DOA: 02/09/2023 DOS: the patient was seen and examined on 02/10/2023 PCP: Celene Squibb, MD  Patient coming from: Home  Chief Complaint:  Chief Complaint  Patient presents with   Fall   HPI: Zachary [name] is a 77 y.o. male with medical history significant of anxiety, arthritis, chronic pain, depression, hypertension, obstructive sleep apnea, syncopal episodes, and more presents the ED with chief complaint of fall.  Patient reports that he fell and broke his hip.  He was so she did lost his balance when he fell backwards.  He reports he hit his head, but did not blackout.  He is not on any blood thinners.  Patient has been off balance now for about a year.  He is following with outpatient neurology.  He did recently started on gabapentin which has made him more fatigued according to the wife.  She feels his balance has been worse since starting gabapentin as well.  They both report that the balance issues have been worse over the whole year so they may not actually be a correlation with the gabapentin which started a few weeks ago.  Patient has not been started on any medications for dizziness.  He has seen neurology who reported that he did not meet criteria for Parkinson's.  He has had some MRIs of his cervical spine and thoracic spine, but has not yet followed up with neurology for the results of those.  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  He does still experience dysuria from that.  He no longer has hematuria.  Patient reports that today, when he fell he had sudden onset of severe pain.  The pain is sharp and is located on his left hip.  There is a few touches lower left leg, but upon further questioning it does not actually Thane down there.  Patient reports otherwise he has been in his normal state of health.  Patient does not smoke, does not drink, does not use illicit drugs.  He is vaccinated  for for flu and RSV.  Patient is full code. Review of Systems: As mentioned in the history of present illness. All other systems reviewed and are negative. Past Medical History:  Diagnosis Date   Anxiety    takes Valium as needed   Arthritis    Bladder cancer ([place])    takes Rapaflo daily   Blood dyscrasia    07/08/16: pt told he was a "free bleeder" after bleeding a lot when derm cut off mole on forehead. No excessive bleeding with minor wounds at home, no bleeding problems perioperatively with prior surgeries.   Chronic back pain    spondylolisthesis   Cluster headaches    Depression    takes Lexapro daily   Essential hypertension, benign    takes Diovan-HCT daily   Hearing loss    hearing aids   History of kidney stones    Hx of complications due to general anesthesia    Aborted surgery 07/15/2016 due to hypotension per pt   Joint pain    Kidney stone 03/2019   passed stone   Obstructive sleep apnea on CPAP    uses CPAP @ night   Parkinsonism    Pneumonia    "several times"--last time about 3-4 yrs ago   Pre-diabetes    Prediabetes    Syncopal episodes    Thyroid condition    Past Surgical History:  Procedure Laterality Date   ANTERIOR CERVICAL DECOMP/DISCECTOMY FUSION N/A 06/05/2013   Procedure:  C3-4 Anterior Cervical Discectomy and Fusion, Allograft, Plate;  Surgeon: [name], MD;  Location: [facility];  Service: Orthopedics;  Laterality: N/A;  C3-4 Anterior Cervical Discectomy and Fusion, Allograft, Plate   Arthroscopic knee surgery     BACK SURGERY      x 2   BLADDER SURGERY     x 5 to remove tumor   CARPAL TUNNEL RELEASE     CATARACT EXTRACTION W/PHACO  12/19/2012   Procedure: CATARACT EXTRACTION PHACO AND INTRAOCULAR LENS PLACEMENT ([place]);  Surgeon: [name], MD;  Location: [facility];  Service: Ophthalmology;  Laterality: Left;  CDE: 26.80   CATARACT EXTRACTION W/PHACO  01/09/2013   Procedure: CATARACT EXTRACTION PHACO AND INTRAOCULAR LENS PLACEMENT ([facility]);  Surgeon:  [name], MD;  Location: [facility];  Service: Ophthalmology;  Laterality: Right;  CDE:22.83   CERVICAL FUSION  06/05/2013   C 3  C4   CYSTOSCOPY     CYSTOSCOPY W/ URETERAL STENT PLACEMENT Bilateral 02/12/2020   Procedure: CYSTOSCOPY WITH RETROGRADE PYELOGRAM/URETERAL STENT PLACEMENT;  Surgeon: Irine Seal, MD;  Location: [facility];  Service: Urology;  Laterality: Bilateral;   LITHOTRIPSY  02/2020   REFRACTIVE SURGERY     Hx: of   RETINAL DETACHMENT SURGERY     TOTAL KNEE ARTHROPLASTY Left 02/01/2018   Procedure: TOTAL KNEE ARTHROPLASTY;  Surgeon: Carole Civil, MD;  Location: [facility];  Service: Orthopedics;  Laterality: Left;   TRANSURETHRAL RESECTION OF BLADDER TUMOR Right 07/27/2015   Procedure: TRANSURETHRAL RESECTION OF BLADDER TUMOR (TURBT);  Surgeon: [name], MD;  Location: [facility];  Service: Urology;  Laterality: Right;   TRANSURETHRAL RESECTION OF BLADDER TUMOR WITH MITOMYCIN-C N/A 01/25/2023   Procedure: TRANSURETHRAL RESECTION OF BLADDER TUMOR WITH GEMCITABINE;  Surgeon: Lucas Mallow, MD;  Location: [facility];  Service: Urology;  Laterality: N/A;   wisdom tooth extraction     Social History:  reports that he quit smoking about 46 years ago. His smoking use included cigarettes and cigars. He has a 0.25 pack-year smoking history. He has never used smokeless tobacco. He reports that he does not drink alcohol and does not use drugs.  Allergies  Allergen Reactions   Poison Oak Extract Hives and Itching   Sulfonamide Derivatives Itching and Rash   Sulfa Antibiotics     Family History  Problem Relation Age of Onset   Hypertension Father    Cancer Father        Prostate   Depression Mother    Hypertension Mother    Alcohol abuse Mother    COPD Mother        passed 07-08-17   Hypertension Sister    Anxiety disorder Sister    Alcohol abuse Maternal Grandfather    ADD / ADHD Neg Hx    Bipolar disorder Neg Hx    Dementia Neg Hx    Drug abuse Neg Hx    OCD Neg Hx     Paranoid behavior Neg Hx    Schizophrenia Neg Hx    Seizures Neg Hx    Sexual abuse Neg Hx    Physical abuse Neg Hx     Prior to Admission medications   Medication Sig Start Date End Date Taking? Authorizing Provider  acetaminophen (TYLENOL) 500 MG tablet Take 1,000 mg by mouth at bedtime as needed for moderate pain.   Yes [provider]  albuterol (VENTOLIN HFA) 108 (90  Base) MCG/ACT inhaler Inhale 2 puffs into the lungs every 4 (four) hours as needed for wheezing or shortness of breath. 11/13/22  Yes Volney American, PA-C  carvedilol (COREG) 3.125 MG tablet Take 3.125 mg by mouth 2 (two) times daily.   Yes [provider]  dorzolamide-timolol (COSOPT) 2-0.5 % ophthalmic solution Place 1 drop into both eyes 2 (two) times daily. 01/02/23  Yes [provider]  DULoxetine (CYMBALTA) 60 MG capsule TAKE 1 CAPSULE(60 MG) BY MOUTH DAILY 03/02/22  Yes Cloria Spring, MD  gabapentin (NEURONTIN) 300 MG capsule Take 300 mg by mouth at bedtime.   Yes [provider]  guaiFENesin (MUCINEX) 600 MG 12 hr tablet Take 1 tablet (600 mg total) by mouth 2 (two) times daily. 11/13/22  Yes Volney American, PA-C  latanoprost (XALATAN) 0.005 % ophthalmic solution Place 1 drop into both eyes at bedtime.   Yes [provider]  pravastatin (PRAVACHOL) 10 MG tablet Take 10 mg by mouth at bedtime. 01/23/20  Yes [provider]  sodium bicarbonate 650 MG tablet Take 650 mg by mouth daily.   Yes [provider]  tamsulosin (FLOMAX) 0.4 MG CAPS capsule Take 0.4 mg by mouth daily.   Yes [provider]  valsartan (DIOVAN) 40 MG tablet Take 40 mg by mouth daily.   Yes [provider]  HYDROcodone-acetaminophen (NORCO) 5-325 MG tablet Take 1 tablet by mouth every 4 (four) hours as needed for moderate pain. Patient not taking: Reported on 02/09/2023 01/25/23   Lucas Mallow, MD  promethazine-dextromethorphan (PROMETHAZINE-DM) 6.25-15  MG/5ML syrup Take 5 mLs by mouth 4 (four) times daily as needed. Patient not taking: Reported on 01/11/2023 11/13/22   Volney American, PA-C    Physical Exam: Vitals:   02/10/23 0200 02/10/23 0230 02/10/23 0300 02/10/23 0330  BP: (!) 141/81 134/80 127/78 139/85  Pulse: 96 90 90 93  Resp: 19 (!) 21 (!) 21 19  Temp:   98 F (36.7 C)   TempSrc:   Oral   SpO2: 98% 99% 99% 100%  Weight:       1.  General: Patient lying supine in bed,  no acute distress   2. Psychiatric: Alert and oriented x 3, mood and behavior normal for situation, pleasant and cooperative with exam   3. Neurologic: Speech and language are normal, face is symmetric, moves all 4 extremities voluntarily, at baseline without acute deficits on limited exam   4. HEENMT:  Head is atraumatic, normocephalic, pupils reactive to light, neck is supple, trachea is midline, mucous membranes are moist   5. Respiratory : Lungs are clear to auscultation bilaterally without wheezing, rhonchi, rales,  no increase in work of breathing or accessory muscle use   6. Cardiovascular : Heart rate normal, rhythm is regular, no murmurs, rubs or gallops, no peripheral edema, peripheral pulses palpated   7. Gastrointestinal:  Abdomen is soft, nondistended, nontender to palpation bowel sounds active, no masses or organomegaly palpated   8. Skin:  Skin is warm, dry and intact without rashes, acute lesions, or ulcers on limited exam   9.Musculoskeletal:  no asymmetry in tone, no peripheral edema, peripheral pulses palpated, no tenderness to palpation in calves  Data Reviewed: In the ED Patient is afebrile, heart rate 72-79, blood pressure 165/95, satting at 96% No leukocytosis, hemoglobin stable at 12.4 Patient typed and screened Chemistry reveals a bump in creatinine to 1.82, this is up from 1.75 so does not meet criteria for an  AKI CT C-spine shows no acute displaced fracture CT head shows no acute intracranial  abnormality Chest x-ray shows no active disease Left hip shows an acute comminuted intertrochanteric fracture of the left proximal femur with varus angulation EKG shows a heart rate of 73, sinus rhythm, QTc 447 with right bundle Normal saline running at 125 mill per hour Patient has received 25 mcg of fentanyl in the ED Zofran 4 mg ordered Ortho consulted recommending admission to [place] and Plan: * Intertrochanteric fracture of left femur (HCC) - X-ray shows left acute comminuted intertrochanteric fracture of the proximal femur with varus angulation -Recommends n.p.o. at night, transfer to [facility], surgery in the a.m. - Continue pain control - Continue to monitor  Mixed hyperlipidemia - Continue statin  Essential hypertension, benign - Continue Coreg, valsartan  Abnormality of gait - Worked up by neurology outpatient - Last visit: Patient does not meet criteria for Parkinson's disease, patient to follow-up with Dr. Dellis Anes regarding spinal stenosis issues, discussed that due to his peripheral neuropathy the best treatment for his balance would be physical therapy-and patient stated he would think about that. 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  C3/4 ACDF with solid arthrodesis.  Spurring causes moderate left foraminal narrowing at C5/6 to C7/T1.  MRI thoracic spine: diffuse spondylosis with multilevel bridging osteophytes.  No neural impingement or visible inflammation - Defer further workup to outpatient neurology      Advance Care Planning:   Code Status: Full Code  Consults: Ortho  Family Communication: Wife at bedside  Severity of Illness: The appropriate patient status for this patient is INPATIENT. Inpatient status is judged to be reasonable and necessary in order to provide the required intensity of service to  ensure the patient's safety. The patient's presenting symptoms, physical exam findings, and initial radiographic and laboratory data in the context of their chronic comorbidities is felt to place them at high risk for further clinical deterioration. Furthermore, it is not anticipated that the patient will be medically stable for discharge from the hospital within 2 midnights of admission.   * I certify that at the point of admission it is my clinical judgment that the patient will require inpatient hospital care spanning beyond 2 midnights from the point of admission due to high intensity of service, high risk for further deterioration and high frequency of surveillance required.*  Author: Rolla Plate, DO 02/10/2023 3:59 AM  For on call review [email or web address].

## 2023-02-10 NOTE — Telephone Encounter (Signed)
Patient's spouse Mardene Celeste called, lvm stating that she is calling for her husband who is a friend of Dr. Aline Brochure.  Stated that he fell and broke his left hip and they have been in the ED for 16 hours.  She stated that [facility] Na is wanting to send him to [facility], but they do not have any beds.  She is wanting Dr. Aline Brochure or one of our other doctors to do his surgery.  She stated that something needed to be done quick that this is ridiculous.  Please advise how to handle.  Spouse's # [phone]

## 2023-02-10 NOTE — TOC Progression Note (Signed)
Transition of Care (TOC) - Progression Note    Transition of Care (TOC) Screening Note   Patient Details  Name: Zachary [name] Date of Birth: 27-Oct-1946   Transition of Care [facility] CM/SW Contact:    Boneta Lucks, RN Phone Number: 02/10/2023, 11:06 AM  Waiting on bed at [facility] for surgery.   Transition of Care Department [facility]) has reviewed patient and no TOC needs have been identified at this time. We will continue to monitor patient advancement through interdisciplinary progression rounds. If new patient transition needs arise, please place a TOC consult.      Barriers to Discharge: Continued Medical Work up (waiting on bed at [facility])  Expected Discharge Plan and [place] arrangements for the past 2 months: [place]

## 2023-02-10 NOTE — Anesthesia Procedure Notes (Addendum)
Procedure Name: LMA Insertion Date/Time: 02/10/2023 3:50 PM  Performed by: Ester Rink, CRNAPre-anesthesia Checklist: Patient identified, Emergency Drugs available, Suction available and Patient being monitored Patient Re-evaluated:Patient Re-evaluated prior to induction Oxygen Delivery Method: Circle system utilized Preoxygenation: Pre-oxygenation with 100% oxygen Induction Type: IV induction Ventilation: Mask ventilation without difficulty LMA: LMA inserted LMA Size: 5.0 Tube type: Oral Number of attempts: 1 Placement Confirmation: positive ETCO2 and breath sounds checked- equal and bilateral Tube secured with: Tape Dental Injury: Teeth and Oropharynx as per pre-operative assessment

## 2023-02-10 NOTE — Telephone Encounter (Signed)
No  [facility]

## 2023-02-11 ENCOUNTER — Encounter (HOSPITAL_COMMUNITY): Payer: Self-pay | Admitting: Student

## 2023-02-11 ENCOUNTER — Other Ambulatory Visit: Payer: Self-pay

## 2023-02-11 DIAGNOSIS — S72142A Displaced intertrochanteric fracture of left femur, initial encounter for closed fracture: Secondary | ICD-10-CM | POA: Diagnosis not present

## 2023-02-11 LAB — CBC
HCT: 29.9 % — ABNORMAL LOW (ref 39.0–52.0)
Hemoglobin: 9.8 g/dL — ABNORMAL LOW (ref 13.0–17.0)
MCH: 30.2 pg (ref 26.0–34.0)
MCHC: 32.8 g/dL (ref 30.0–36.0)
MCV: 92.3 fL (ref 80.0–100.0)
Platelets: 214 10*3/uL (ref 150–400)
RBC: 3.24 MIL/uL — ABNORMAL LOW (ref 4.22–5.81)
RDW: 13.5 % (ref 11.5–15.5)
WBC: 10.8 10*3/uL — ABNORMAL HIGH (ref 4.0–10.5)
nRBC: 0 % (ref 0.0–0.2)

## 2023-02-11 LAB — BASIC METABOLIC PANEL
Anion gap: 10 (ref 5–15)
BUN: 23 mg/dL (ref 8–23)
CO2: 22 mmol/L (ref 22–32)
Calcium: 7.8 mg/dL — ABNORMAL LOW (ref 8.9–10.3)
Chloride: 105 mmol/L (ref 98–111)
Creatinine, Ser: 1.74 mg/dL — ABNORMAL HIGH (ref 0.61–1.24)
GFR, Estimated: 40 mL/min — ABNORMAL LOW (ref >60)
Glucose, Bld: 171 mg/dL — ABNORMAL HIGH (ref 70–99)
Potassium: 4.4 mmol/L (ref 3.5–5.1)
Sodium: 137 mmol/L (ref 135–145)

## 2023-02-11 NOTE — Progress Notes (Signed)
Patient connected to the SPO2 monitor (continuous), he requested to be disconnected so that he could try get some sleep. SpO2 95%. His CPAP machine is also not complete and the RT was there to help with the hospital one, he prefers to have the wife bring the rest of the assortment.

## 2023-02-11 NOTE — Progress Notes (Signed)
Orthopaedic Trauma Progress Note  SUBJECTIVE: Doing fairly well this morning. In good spirits.  Had some increased pain in the hip when working with therapies but pain medications are helping to ease this off.  He is now in the bedside chair and pain is improving.  No chest pain. No SOB. No nausea/vomiting. No other complaints.  No family at bedside.  Patient is agreeable to SNF at this point.  States he would like to go to rehab facility adjacent to [facility] in [place], [place] if possible  OBJECTIVE:  Vitals:   02/11/23 0003 02/11/23 0800  BP:  (!) 145/69  Pulse:  80  Resp:    Temp:  97.6 F (36.4 C)  SpO2: 95% 96%    General: Sitting up in bedside chair, no acute distress.  Alert and oriented x 4 Respiratory: No increased work of breathing.  LLE: Dressings clean, dry, intact.  No significant tenderness with palpation over the hip or throughout the thigh.  Tolerates gentle knee range of motion.  Ankle dorsiflexion and plantarflexion are intact.  Sensation intact to light touch distally.  2+ DP pulse  IMAGING: Stable post op imaging.   LABS:  Results for orders placed or performed during the hospital encounter of 02/09/23 (from the past 24 hour(s))  Surgical pcr screen     Status: None   Collection Time: 02/10/23  2:34 PM   Specimen: Nasal Mucosa; Nasal Swab  Result Value Ref Range   MRSA, PCR NEGATIVE NEGATIVE   Staphylococcus aureus NEGATIVE NEGATIVE  CBC     Status: Abnormal   Collection Time: 02/10/23  6:33 PM  Result Value Ref Range   WBC 13.7 (H) 4.0 - 10.5 K/uL   RBC 3.89 (L) 4.22 - 5.81 MIL/uL   Hemoglobin 11.8 (L) 13.0 - 17.0 g/dL   HCT 36.1 (L) 39.0 - 52.0 %   MCV 92.8 80.0 - 100.0 fL   MCH 30.3 26.0 - 34.0 pg   MCHC 32.7 30.0 - 36.0 g/dL   RDW 13.8 11.5 - 15.5 %   Platelets 215 150 - 400 K/uL   nRBC 0.0 0.0 - 0.2 %  Creatinine, serum     Status: Abnormal   Collection Time: 02/10/23  6:33 PM  Result Value Ref Range   Creatinine, Ser 1.67 (H) 0.61 - 1.24 mg/dL    GFR, Estimated 42 (L) >60 mL/min  CBC     Status: Abnormal   Collection Time: 02/11/23  5:45 AM  Result Value Ref Range   WBC 10.8 (H) 4.0 - 10.5 K/uL   RBC 3.24 (L) 4.22 - 5.81 MIL/uL   Hemoglobin 9.8 (L) 13.0 - 17.0 g/dL   HCT 29.9 (L) 39.0 - 52.0 %   MCV 92.3 80.0 - 100.0 fL   MCH 30.2 26.0 - 34.0 pg   MCHC 32.8 30.0 - 36.0 g/dL   RDW 13.5 11.5 - 15.5 %   Platelets 214 150 - 400 K/uL   nRBC 0.0 0.0 - 0.2 %  Basic metabolic panel     Status: Abnormal   Collection Time: 02/11/23  5:45 AM  Result Value Ref Range   Sodium 137 135 - 145 mmol/L   Potassium 4.4 3.5 - 5.1 mmol/L   Chloride 105 98 - 111 mmol/L   CO2 22 22 - 32 mmol/L   Glucose, Bld 171 (H) 70 - 99 mg/dL   BUN 23 8 - 23 mg/dL   Creatinine, Ser 1.74 (H) 0.61 - 1.24 mg/dL   Calcium 7.8 (  L) 8.9 - 10.3 mg/dL   GFR, Estimated 40 (L) >60 mL/min   Anion gap 10 5 - 15    ASSESSMENT: Zachary [name] is a 77 y.o. male, 1 Day Post-Op s/p INTRAMEDULLARY NAILING OF LEFT FEMUR  CV/Blood loss: Acute blood loss anemia, Hgb 9.8 this AM. Hemodynamically stable  PLAN: Weightbearing: WBAT LLE ROM: Okay for unrestricted hip and knee range of motion as tolerated Incisional and dressing care: Reinforce dressings as needed  Showering: Okay to begin showering getting incisions wet 02/13/2023 Orthopedic device(s): None  Pain management:  1. Tylenol 650 mg q 6 hours scheduled 2. Robaxin 500 mg q 6 hours PRN 3. Oxycodone 5 mg q 4 hours PRN 4. Dilaudid 1 mg q 3 hours PRN 5.  Neurontin 300 mg nightly VTE prophylaxis: Lovenox, SCDs ID:  Ancef 2gm post op Foley/Lines:  No foley, KVO IVFs Impediments to Fracture Healing: Vitamin D level pending, will start supplementation as indicated Dispo: PT/OT evaluation ongoing, currently recommending SNF.  Will plan to remove dressings from LLE tomorrow 02/12/2023.  Okay for discharge from ortho standpoint once cleared by medicine team and therapies  D/C recommendations: -Oxycodone for pain  control -Eliquis 2.5 mg twice daily x 30 days for DVT prophylaxis -Possible need for Vit D supplementation  Follow - up plan: 2 weeks after discharge for wound check and repeat x-rays   Contact information:  Katha Hamming MD, Rushie Nyhan PA-C. After hours and holidays please check [email or web address] for group call information for [facility]   Gwinda Passe, PA-C [phone] (office) [email or web address]

## 2023-02-11 NOTE — Evaluation (Signed)
Physical Therapy Evaluation Patient Details Name: Zachary [name] MRN: [id number] DOB: April 20, 1946 Today's Date: 02/11/2023  History of Present Illness  77 yo admitted to [facility] 2/13 after [place] with left femur fx. Transfer to [facility] 2/14 for IM nail same date. PMHx: anxiety, bladder CA, HTN, OSA, Parkinsonism, chronic back pain  Clinical Impression  Pt pleasant and telling jokes throughout session. Pt with maintained 7/10 pain LLE despite premedication with pt hesitant, resistant and fearful of moving. Pt provided encouragement and reassurance with increased ROM end of session. Pt with decreased strength, ROM, transfers and mobility who will benefit from acute therapy to maximize mobility and safety. Encouraged HEP throughout the day and OOB daily with nursing.      Recommendations for follow up therapy are one component of a multi-disciplinary discharge planning process, led by the attending physician.  Recommendations may be updated based on patient status, additional functional criteria and insurance authorization.  Follow Up Recommendations Skilled nursing-short term rehab (<3 hours/day) Can patient physically be transported by private vehicle: No    Assistance Recommended at Discharge Frequent or constant Supervision/Assistance  Patient can return home with the following  Two people to help with walking and/or transfers    Equipment Recommendations Rolling walker (2 wheels);BSC/3in1  Recommendations for Other Services       Functional Status Assessment Patient has had a recent decline in their functional status and/or demonstrates limited ability to make significant improvements in function in a reasonable and predictable amount of time     Precautions / Restrictions Precautions Precautions: Fall Restrictions Weight Bearing Restrictions: Yes LLE Weight Bearing: Weight bearing as tolerated      Mobility  Bed Mobility Overal bed mobility: Needs Assistance Bed Mobility:  Supine to Sit     Supine to sit: HOB elevated, Mod assist, +2 for physical assistance     General bed mobility comments: HOB 40 degrees with mod +2 assist to pivot to EOB with use of pad    Transfers Overall transfer level: Needs assistance   Transfers: Sit to/from Stand Sit to Stand: Min assist, +2 physical assistance, From elevated surface           General transfer comment: min +2 assist with cues for hand placement and sequence to rise from elevated bed and to control descent to chair    Ambulation/Gait Ambulation/Gait assistance: Min assist, +2 physical assistance Gait Distance (Feet): 4 Feet Assistive device: Rolling walker (2 wheels) Gait Pattern/deviations: Step-to pattern   Gait velocity interpretation: <1.31 ft/sec, indicative of household ambulator   General Gait Details: pt with cues for sequence, physical assist to advance LLE and chair pulled to pt due to lightheadedness  Stairs            Wheelchair Mobility    Modified Rankin (Stroke Patients Only)       Balance Overall balance assessment: Needs assistance   Sitting balance-Leahy Scale: Fair     Standing balance support: Bilateral upper extremity supported, Reliant on assistive device for balance Standing balance-Leahy Scale: Poor Standing balance comment: reliant on RW                             Pertinent Vitals/Pain Pain Assessment Pain Assessment: 0-10 Pain Score: 7  Pain Location: left hip Pain Descriptors / Indicators: Aching, Guarding Pain Intervention(s): Limited activity within patient's tolerance, Repositioned, Monitored during session, Premedicated before session    Home Living Family/patient expects to be discharged  to:: Private residence Living Arrangements: Spouse/significant other Available Help at Discharge: Family;Available 24 hours/day Type of Home: House Home Access: Stairs to enter   [employer] of Steps: 5   Home Layout: One  level Home Equipment: Cane - single point Additional Comments: pt makes canes    Prior Function Prior Level of Function : Independent/Modified Independent                     Hand Dominance        Extremity/Trunk Assessment   Upper Extremity Assessment Upper Extremity Assessment: Overall WFL for tasks assessed    Lower Extremity Assessment Lower Extremity Assessment: LLE deficits/detail LLE Deficits / Details: pt resistant to movement, fearful of pain with grossly 2/5 strength limited by pain, AAROM also limited by pain    Cervical / Trunk Assessment Cervical / Trunk Assessment: Kyphotic  Communication   Communication: No difficulties  Cognition Arousal/Alertness: Awake/alert Behavior During Therapy: WFL for tasks assessed/performed Overall Cognitive Status: Within Functional Limits for tasks assessed                                          General Comments      Exercises General Exercises - Lower Extremity Long Arc Quad: AROM, 10 reps, Left, Seated Heel Slides: AAROM, Left, Supine, 5 reps Hip ABduction/ADduction: AAROM, Left, 10 reps, Supine Hip Flexion/Marching: AAROM, Left, Seated, 10 reps   Assessment/Plan    PT Assessment Patient needs continued PT services  PT Problem List Decreased strength;Decreased mobility;Decreased activity tolerance;Decreased balance;Decreased knowledge of use of DME;Pain;Decreased range of motion       PT Treatment Interventions DME instruction;Therapeutic activities;Gait training;Therapeutic exercise;Patient/family education;Balance training;Functional mobility training;Stair training    PT Goals (Current goals can be found in the Care Plan section)  Acute Rehab PT Goals Patient Stated Goal: return home PT Goal Formulation: With patient Time For Goal Achievement: 02/25/23 Potential to Achieve Goals: Good    Frequency Min 4X/week     Co-evaluation               AM-PAC PT "6 Clicks" Mobility   Outcome Measure Help needed turning from your back to your side while in a flat bed without using bedrails?: A Lot Help needed moving from lying on your back to sitting on the side of a flat bed without using bedrails?: Total Help needed moving to and from a bed to a chair (including a wheelchair)?: Total Help needed standing up from a chair using your arms (e.g., wheelchair or bedside chair)?: Total Help needed to walk in hospital room?: Total Help needed climbing 3-5 steps with a railing? : Total 6 Click Score: 7    End of Session Equipment Utilized During Treatment: Gait belt Activity Tolerance: Patient tolerated treatment well Patient left: in chair;with call bell/phone within reach;with chair alarm set Nurse Communication: Mobility status;Weight bearing status;Precautions PT Visit Diagnosis: Other abnormalities of gait and mobility (R26.89);Muscle weakness (generalized) (M62.81)    Time: [id number] PT Time Calculation (min) (ACUTE ONLY): 24 min   Charges:   PT Evaluation $PT Eval Moderate Complexity: 1 Mod PT Treatments $Therapeutic Activity: 8-22 mins        Bayard Males, PT Acute Rehabilitation Services Office: [phone]   Korrie Hofbauer B Yordi Krager 02/11/2023, 10:07 AM

## 2023-02-11 NOTE — Progress Notes (Signed)
PROGRESS NOTE  Zachary SODERGREN [id number] DOB: 09-19-1946 DOA: 02/09/2023 PCP: Celene Squibb, MD   LOS: 2 days   Brief Narrative / Interim history: 77 year old male with HTN, OSA, few years of balance problems seen by neurology and neurosurgery as an outpatient, back pain, comes to the hospital after a fall followed by hip pain.  He was found to have a left intertrochanteric femur fracture, orthopedic surgery was consulted and he was admitted to the hospital  Subjective / 24h Interval events: Sitting in chair, he is doing well.  Just worked with physical therapy.  Complains of anticipated hip pain  Assesement and Plan: Principal Problem:   Intertrochanteric fracture of left femur (HCC) Active Problems:   Abnormality of gait   Essential hypertension, benign   Mixed hyperlipidemia  Principal problem Left intertrochanteric femur fracture -orthopedic surgery consulted, he was taken to the OR on 2/14 and is status post cephalomedullary nailing by Dr. Doreatha Martin.  Continue to monitor in the postoperative setting, orthopedics recommend Eliquis 2.5 mg BID for 30 days, pain control.  PT recommends SNF.  TOC consulted  Active problems Balance issues -going back at least couple years.  He has been following with neurology as well as neurosurgery.  He has had back surgery in the past.  Overall he does not appreciate significant improvement in the last several years  Bladder tumor -sees urology, status post cystoscopy with transurethral resection by Dr. Gloriann Loan January 2024.  Continue tamsulosin  Essential hypertension-continue home medications as below  Hyperlipidemia-continue statin    Scheduled Meds:  acetaminophen  650 mg Oral Q6H   carvedilol  3.125 mg Oral BID   docusate sodium  100 mg Oral BID   DULoxetine  60 mg Oral Daily   enoxaparin (LOVENOX) injection  40 mg Subcutaneous Q24H   gabapentin  300 mg Oral QHS   irbesartan  37.5 mg Oral Daily   pneumococcal 20-valent conjugate vaccine   0.5 mL Intramuscular Tomorrow-1000   pravastatin  10 mg Oral QHS   sodium bicarbonate  650 mg Oral Daily   tamsulosin  0.4 mg Oral Daily   Continuous Infusions:  sodium chloride 100 mL/hr at 02/11/23 0552    ceFAZolin (ANCEF) IV Stopped (02/11/23 0540)   methocarbamol (ROBAXIN) IV     PRN Meds:.HYDROmorphone (DILAUDID) injection, methocarbamol **OR** methocarbamol (ROBAXIN) IV, metoCLOPramide **OR** metoCLOPramide (REGLAN) injection, ondansetron **OR** ondansetron (ZOFRAN) IV, oxyCODONE, polyethylene glycol  Current Outpatient Medications  Medication Instructions   acetaminophen (TYLENOL) 1,000 mg, Oral, At bedtime PRN   albuterol (VENTOLIN HFA) 108 (90 Base) MCG/ACT inhaler 2 puffs, Inhalation, Every 4 hours PRN   carvedilol (COREG) 3.125 mg, Oral, 2 times daily   dorzolamide-timolol (COSOPT) 2-0.5 % ophthalmic solution 1 drop, Both Eyes, 2 times daily   DULoxetine (CYMBALTA) 60 MG capsule TAKE 1 CAPSULE(60 MG) BY MOUTH DAILY   gabapentin (NEURONTIN) 300 mg, Oral, Daily at bedtime   guaiFENesin (MUCINEX) 600 mg, Oral, 2 times daily   HYDROcodone-acetaminophen (NORCO) 5-325 MG tablet 1 tablet, Oral, Every 4 hours PRN   latanoprost (XALATAN) 0.005 % ophthalmic solution 1 drop, Both Eyes, Daily at bedtime   pravastatin (PRAVACHOL) 10 mg, Oral, Daily at bedtime   promethazine-dextromethorphan (PROMETHAZINE-DM) 6.25-15 MG/5ML syrup 5 mLs, Oral, 4 times daily PRN   sodium bicarbonate 650 mg, Oral, Daily   tamsulosin (FLOMAX) 0.4 mg, Oral, Daily   valsartan (DIOVAN) 40 mg, Oral, Daily    Diet Orders (From admission, onward)     Start  Ordered   02/10/23 1825  Diet Heart Room service appropriate? Yes; Fluid consistency: Thin  Diet effective now       Question Answer Comment  Room service appropriate? Yes   Fluid consistency: Thin      02/10/23 1825            DVT prophylaxis: enoxaparin (LOVENOX) injection 40 mg Start: 02/11/23 0800 SCDs Start: 02/10/23 1825 SCDs Start:  02/09/23 2220   Lab Results  Component Value Date   PLT 214 02/11/2023      Code Status: Full Code  Family Communication: no family at bedside   Status is: Inpatient  Remains inpatient appropriate because: severity of illness  Level of care: Med-Surg  Consultants:  Orthopedic surgery   Objective: Vitals:   02/10/23 2027 02/10/23 2028 02/11/23 0003 02/11/23 0800  BP: (!) 152/9 (!) 152/79  (!) 145/69  Pulse: 87 87  80  Resp: 16     Temp: 97.6 F (36.4 C) 97.6 F (36.4 C)  97.6 F (36.4 C)  TempSrc: Oral Oral  Oral  SpO2: 96% (!) 88% 95% 96%  Weight:        Intake/Output Summary (Last 24 hours) at 02/11/2023 1021 Last data filed at 02/11/2023 [id number] Gross per 24 hour  Intake 1955.8 ml  Output 1525 ml  Net 430.8 ml   Wt Readings from Last 3 Encounters:  02/09/23 97.5 kg  01/25/23 97.7 kg  01/18/23 97.7 kg    Examination:  Constitutional: NAD Eyes: no scleral icterus ENMT: Mucous membranes are moist.  Neck: normal, supple Respiratory: clear to auscultation bilaterally, no wheezing, no crackles. Normal respiratory effort. No accessory muscle use.  Cardiovascular: Regular rate and rhythm, no murmurs / rubs / gallops. No LE edema.  Abdomen: non distended, no tenderness. Bowel sounds positive.  Musculoskeletal: no clubbing / cyanosis.   Data Reviewed: I have independently reviewed following labs and imaging studies   CBC Recent Labs  Lab 02/09/23 2002 02/10/23 0426 02/10/23 1833 02/11/23 0545  WBC 9.4 12.3* 13.7* 10.8*  HGB 12.4* 10.9* 11.8* 9.8*  HCT 39.1 34.1* 36.1* 29.9*  PLT 235 219 215 214  MCV 93.1 92.7 92.8 92.3  MCH 29.5 29.6 30.3 30.2  MCHC 31.7 32.0 32.7 32.8  RDW 13.5 13.5 13.8 13.5  LYMPHSABS 1.5 1.2  --   --   MONOABS 0.5 0.6  --   --   EOSABS 0.2 0.0  --   --   BASOSABS 0.1 0.0  --   --     Recent Labs  Lab 02/09/23 2002 02/10/23 0426 02/10/23 1833 02/11/23 0545  NA 141 141  --  137  K 3.6 3.8  --  4.4  CL 105 109  --  105   CO2 25 24  --  22  GLUCOSE 96 116*  --  171*  BUN 27* 26*  --  23  CREATININE 1.82* 1.59* 1.67* 1.74*  CALCIUM 8.5* 8.0*  --  7.8*  AST  --  15  --   --   ALT  --  9  --   --   ALKPHOS  --  77  --   --   BILITOT  --  0.8  --   --   ALBUMIN  --  3.0*  --   --   MG  --  1.6*  --   --   INR 1.0  --   --   --     ------------------------------------------------------------------------------------------------------------------ No results for  input(s): "CHOL", "HDL", "LDLCALC", "TRIG", "CHOLHDL", "LDLDIRECT" in the last 72 hours.  Lab Results  Component Value Date   HGBA1C 6.3 12/01/2022   ------------------------------------------------------------------------------------------------------------------ No results for input(s): "TSH", "T4TOTAL", "T3FREE", "THYROIDAB" in the last 72 hours.  Invalid input(s): "FREET3"  Cardiac Enzymes No results for input(s): "CKMB", "TROPONINI", "MYOGLOBIN" in the last 168 hours.  Invalid input(s): "CK" ------------------------------------------------------------------------------------------------------------------ No results found for: "BNP"  CBG: No results for input(s): "GLUCAP" in the last 168 hours.  Recent Results (from the past 240 hour(s))  Surgical pcr screen     Status: None   Collection Time: 02/10/23  2:34 PM   Specimen: Nasal Mucosa; Nasal Swab  Result Value Ref Range Status   MRSA, PCR NEGATIVE NEGATIVE Final   Staphylococcus aureus NEGATIVE NEGATIVE Final    Comment: (NOTE) The Xpert SA Assay (FDA approved for NASAL specimens in patients 52 years of age and older), is one component of a comprehensive surveillance program. It is not intended to diagnose infection nor to guide or monitor treatment. Performed at [facility], [place] [address], [place], [place] [address]      Radiology Studies: DG HIP PORT UNILAT W OR W/O PELVIS 1V LEFT  Result Date: 02/10/2023 CLINICAL DATA:  LEFT hip fracture EXAM: DG HIP (WITH OR  WITHOUT PELVIS) 1V PORT LEFT COMPARISON:  02/09/2023 FINDINGS: Intramedullary nail fixation of the proximal femur. Compression screw spans the LEFT femoral neck. Distal locking screw noted in the distal medullary nail. Avulsion of the lesser trochanter. IMPRESSION: Intramedullary nail fixation LEFT intertrochanteric femur fracture Electronically Signed   By: Suzy Bouchard M.D.   On: 02/10/2023 18:35   DG HIP UNILAT WITH PELVIS 1V LEFT  Result Date: 02/10/2023 CLINICAL DATA:  Elective surgery EXAM: DG HIP (WITH OR WITHOUT PELVIS) 1V*L* COMPARISON:  Left hip x-ray 02/09/2023 FINDINGS: Six intraoperative fluoroscopic views of the left hip. There is a left-sided hip screw and intramedullary nail fixating a comminuted intratrochanteric fracture. Alignment is anatomic. Fluoroscopy time 52 seconds. Dose 9.10 micro gray. IMPRESSION: Left hip screw and intramedullary nail fixating a comminuted intratrochanteric fracture. Electronically Signed   By: Ronney Asters M.D.   On: 02/10/2023 17:46   DG C-Arm 1-60 Min-No Report  Result Date: 02/10/2023 Fluoroscopy was utilized by the requesting physician.  No radiographic interpretation.     Marzetta Board, MD, PhD [facility]  Between 7 am - 7 pm I am available, please contact me via Amion (for emergencies) or Securechat (non urgent messages)  Between 7 pm - 7 am I am not available, please contact night coverage MD/APP via Amion

## 2023-02-11 NOTE — Progress Notes (Signed)
Patient will wait to wear CPAP tomorrow when she brings machine. Wife only brought mask and tubing. Brought hospital machine but prefers to wait.

## 2023-02-11 NOTE — Evaluation (Addendum)
Occupational Therapy Evaluation Patient Details Name: Zachary [name] MRN: [id number] DOB: [birth year] Today's Date: 02/11/2023   History of Present Illness 77 yo admitted to [facility] 2/13 [place] with left femur fx. Transfer to [facility] 2/14 for IM nail same date. PMHx: anxiety, bladder CA, HTN, OSA, Parkinsonism, chronic back pain   Clinical Impression   Pt currently at a mod to max assist overall for simulated LB selfcare tasks sit to stand and simulated toilet transfers with use of the RW for support.  Pt with reports of some light headedness in sitting but BP stable at 123/62 with Oxygen at 95% and HR in the low 90s.  Decreased ability to advance the LLE or bear weight for stepping with the RLE.  Feel he will benefit from acute care OT at this time with transition to SNF for further rehab in order to achieve a level that is safe for home.        Recommendations for follow up therapy are one component of a multi-disciplinary discharge planning process, led by the attending physician.  Recommendations may be updated based on patient status, additional functional criteria and insurance authorization.   Follow Up Recommendations  Skilled nursing-short term rehab (<3 hours/day)     Assistance Recommended at Discharge Frequent or constant Supervision/Assistance  Patient can return home with the following A lot of help with walking and/or transfers;A lot of help with bathing/dressing/bathroom;Assist for transportation;Help with stairs or ramp for entrance;Assistance with cooking/housework;Direct supervision/assist for financial management    Functional Status Assessment  Patient has had a recent decline in their functional status and demonstrates the ability to make significant improvements in function in a reasonable and predictable amount of time.  Equipment Recommendations  Other (comment) (TBD next venue of care)       Precautions / Restrictions Precautions Precautions:  Fall Restrictions Weight Bearing Restrictions: No LLE Weight Bearing: Weight bearing as tolerated      Mobility Bed Mobility                    Transfers Overall transfer level: Needs assistance   Transfers: Sit to/from Stand Sit to Stand: +2 physical assistance, From elevated surface, Mod assist           General transfer comment: Mod assist for sit to stand from the recliner.  Two attempts needed, first with both hands pushing from the arms of the chair, which was not successful and then the second with one hand on the RW and then the other on the chair arm.      Balance Overall balance assessment: Needs assistance Sitting-balance support: Feet supported Sitting balance-Leahy Scale: Fair     Standing balance support: During functional activity Standing balance-Leahy Scale: Poor Standing balance comment: Pt needs BUE support on the walker for balance.                           ADL either performed or assessed with clinical judgement   ADL Overall ADL's : Needs assistance/impaired Eating/Feeding: Independent;Sitting   Grooming: Wash/dry hands;Wash/dry face;Sitting;Set up Grooming Details (indicate cue type and reason): simulated Upper Body Bathing: Set up;Sitting   Lower Body Bathing: Maximal assistance;Sit to/from stand Lower Body Bathing Details (indicate cue type and reason): simulated Upper Body Dressing : Moderate assistance Upper Body Dressing Details (indicate cue type and reason): simulated Lower Body Dressing: Maximal assistance;Sit to/from stand   Toilet Transfer: Moderate assistance;BSC/3in1;Rolling walker (2 wheels) Toilet  Transfer Details (indicate cue type and reason): simulated to recliner Toileting- Clothing Manipulation and Hygiene: Maximal assistance;Sit to/from stand Toileting - Clothing Manipulation Details (indicate cue type and reason): sit to stand     Functional mobility during ADLs: Moderate assistance (to take a few  steps forward and then backwards) General ADL Comments: Pt with decreased ability to reach below his shins for simulated LB dressing sit to stand secondary to increased pain.  Will likely benefit from AE for LB selfcare.     Vision Baseline Vision/History: 1 Wears glasses Ability to See in Adequate Light: 0 Adequate Patient Visual Report: No change from baseline Vision Assessment?: No apparent visual deficits     Perception  Not tested   Praxis  Not tested    Pertinent Vitals/Pain Pain Assessment Pain Assessment: Faces Faces Pain Scale: Hurts little more Pain Location: left hip Pain Descriptors / Indicators: Aching, Guarding Pain Intervention(s): Monitored during session, Repositioned     Hand Dominance Right   Extremity/Trunk Assessment Upper Extremity Assessment Upper Extremity Assessment: Overall WFL for tasks assessed   Lower Extremity Assessment Lower Extremity Assessment: Defer to PT evaluation LLE Deficits / Details: pt resistant to movement, fearful of pain with grossly 2/5 strength limited by pain, AAROM also limited by pain   Cervical / Trunk Assessment Cervical / Trunk Assessment: Kyphotic (slight thoracic kyphosis)   Communication Communication Communication: No difficulties   Cognition Arousal/Alertness: Awake/alert Behavior During Therapy: WFL for tasks assessed/performed Overall Cognitive Status: Within Functional Limits for tasks assessed                                                  Home Living Family/patient expects to be discharged to:: Private residence Living Arrangements: Spouse/significant other Available Help at Discharge: Family;Available 24 hours/day Type of Home: House Home Access: Stairs to enter [employer] of Steps: 5   Home Layout: One level     Bathroom Shower/Tub: Teacher, early years/pre: Standard     Home Equipment: Cane - single point   Additional Comments: pt makes canes       Prior Functioning/Environment Prior Level of Function : Independent/Modified Independent                        OT Problem List: Decreased strength;Impaired balance (sitting and/or standing);Pain;Decreased knowledge of use of DME or AE;Decreased activity tolerance      OT Treatment/Interventions: Self-care/ADL training;Patient/family education;Balance training;Therapeutic activities;Energy conservation    OT Goals(Current goals can be found in the care plan section) Acute Rehab OT Goals Patient Stated Goal: Pt is agreeable to go to rehab OT Goal Formulation: With patient Time For Goal Achievement: 02/25/23 Potential to Achieve Goals: Good  OT Frequency: Min 2X/week       AM-PAC OT "6 Clicks" Daily Activity     Outcome Measure Help from another person eating meals?: None Help from another person taking care of personal grooming?: A Little Help from another person toileting, which includes using toliet, bedpan, or urinal?: A Lot Help from another person bathing (including washing, rinsing, drying)?: A Lot Help from another person to put on and taking off regular upper body clothing?: A Little Help from another person to put on and taking off regular lower body clothing?: A Lot 6 Click Score: 16   End of Session Equipment  Utilized During Treatment: Rolling walker (2 wheels) Nurse Communication: Mobility status  Activity Tolerance: Patient limited by pain Patient left: in chair;with call bell/phone within reach;with chair alarm set  OT Visit Diagnosis: Unsteadiness on feet (R26.81);Muscle weakness (generalized) (M62.81);Pain Pain - Right/Left: Left Pain - part of body: Leg                Time: [id number] OT Time Calculation (min): 43 min Charges:  OT General Charges $OT Visit: 1 Visit OT Evaluation $OT Eval Moderate Complexity: 1 Mod OT Treatments $Self Care/Home Management : 23-37 mins Clyda Greener, OTR/L [place]  Office  [phone] 02/11/2023

## 2023-02-11 NOTE — TOC Initial Note (Signed)
Transition of Care [facility]) - Initial/Assessment Note    Patient Details  Name: Zachary [name] MRN: [id number] Date of Birth: 02-Oct-1946  Transition of Care [facility]) CM/SW Contact:    Curlene Labrum, RN Phone Number: 02/11/2023, 12:02 PM  Clinical Narrative:                 CM met with the patient at the bedside to discuss transitions of care needs for [facility] placement.  The patient is S/P Left femur fracture and repair on 02/10/2023.  The patient was living with his wife prior to hospitalization and is agreeable to SNF placement.  The patient will be faxed out in the hub and follow up with the patient in the am with bed offers.  The patient prefers 1. [facility] and 2. [facility].  CM will follow up for SNF placement - pending bed offers at this time - Will call [employer] to update.  Expected Discharge Plan: Skilled Nursing Facility Barriers to Discharge: Continued Medical Work up   Patient Goals and CMS Choice Patient states their goals for this hospitalization and ongoing recovery are:: To get better - agreeable to SNF placement for short term rehab CMS Medicare.gov Compare Post Acute Care list provided to:: Patient Choice offered to / list presented to : Patient [place] ownership interest in [facility].provided to:: Patient    Expected Discharge Plan and Services   Discharge Planning Services: CM Consult Post Acute Care Choice: [place] Living arrangements for the past 2 months: Single Family Home                                      Prior Living Arrangements/Services Living arrangements for the past 2 months: Single Family Home Lives with:: Spouse Patient language and need for interpreter reviewed:: Yes Do you feel safe going back to the place where you live?: Yes      Need for Family Participation in Patient Care: Yes (Comment) Care giver support system in place?: Yes (comment) Current home services: DME  ([facility] at the home) Criminal Activity/Legal Involvement Pertinent to Current Situation/Hospitalization: No - Comment as needed  Activities of Daily Living Home Assistive Devices/Equipment: Eyeglasses ADL Screening (condition at time of admission) Patient's cognitive ability adequate to safely complete daily activities?: Yes Is the patient deaf or have difficulty hearing?: Yes Does the patient have difficulty seeing, even when wearing glasses/contacts?: No Does the patient have difficulty concentrating, remembering, or making decisions?: No Patient able to express need for assistance with ADLs?: Yes Does the patient have difficulty dressing or bathing?: No Independently performs ADLs?: Yes (appropriate for developmental age) Does the patient have difficulty walking or climbing stairs?: Yes Weakness of Legs: Both Weakness of Arms/Hands: None  Permission Sought/Granted Permission sought to share information with : Case Manager, Customer service manager, Family Supports Permission granted to share information with : Yes, Verbal Permission Granted     Permission granted to share info w AGENCY: SNf placement  Permission granted to share info w Relationship: spouse, Delford Liechty [id number]     Emotional Assessment Appearance:: Appears stated age Attitude/Demeanor/Rapport: Gracious Affect (typically observed): Accepting Orientation: : Oriented to Self, Oriented to Place, Oriented to  Time, Oriented to Situation Alcohol / Substance Use: Not Applicable Psych Involvement: No (comment)  Admission diagnosis:  Intertrochanteric fracture of left femur ([place]) [S72.142A] Closed fracture of left hip, initial  encounter [facility]) [S72.002A] Patient Active Problem List   Diagnosis Date Noted   Intertrochanteric fracture of left femur ([place]) 02/09/2023   Left ureteral stone 02/12/2020   Subclinical hyperthyroidism 09/20/2018   Prediabetes 09/20/2018   S/P total knee replacement, 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  02/07/2018   Primary osteoarthritis of left knee 02/01/2018   Postural hypotension 07/23/2016   Dysphagia 07/23/2016   Obstructive sleep apnea 07/21/2016   Hyperglycemia 07/21/2016   Anemia, unspecified 07/21/2016   Spinal stenosis of lumbar region 07/15/2016   Bladder tumor 07/27/2015   Epiretinal membrane (ERM) of left eye 03/14/2014   H/O retinal detachment 03/14/2014   Pseudophakia of both eyes 03/14/2014   Retinal detachment 03/14/2014   Visual distortion 03/14/2014   HNP (herniated nucleus pulposus), cervical 06/05/2013    Class: Diagnosis of   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   Muscle spasms of neck 04/04/2013   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 anxiety 03/01/2013   OA (osteoarthritis) of knee 02/22/2013   Iliotibial band syndrome of left side 11/22/2012   Localized, primary osteoarthritis of the ankle and foot 10/11/2012   Difficulty in walking(719.7) 08/17/2012   Peroneal tendinitis 08/16/2012   Precordial pain 02/09/2012   Essential hypertension, benign 02/09/2012   Mixed hyperlipidemia 02/09/2012   Dysthymia 02/04/2012   Abnormality of gait 12/23/2011   Lateral epicondylitis/tennis elbow 05/06/2011   TENOSYNOVITIS OF FOOT AND ANKLE 10/22/2010   PCP:  Celene Squibb, MD Pharmacy:   [place], [place] - [address] [place] [address] Phone: [phone] Fax: [place], [place] AT [place] [place] [address] Phone: [phone] Fax: [phone]     Social Determinants of Health (SDOH) Social History: SDOH Screenings   Food Insecurity: No Food Insecurity (02/10/2023)  Housing: Low Risk  (02/10/2023)  Transportation Needs: No Transportation Needs (02/10/2023)  Utilities: Not At Risk (02/10/2023)  Depression (PHQ2-9): Low Risk  (03/02/2022)  Tobacco Use: Medium Risk (02/11/2023)   SDOH  Interventions:     Readmission Risk Interventions    02/11/2023   12:02 PM  Readmission Risk Prevention Plan  Transportation Screening Complete  PCP or Specialist Appt within 5-7 Days Complete  Home Care Screening Complete  Medication Review (RN CM) Complete

## 2023-02-11 NOTE — Addendum Note (Signed)
Addendum  created 02/11/23 1149 by Lynda Rainwater, MD   Intraprocedure Staff edited

## 2023-02-12 DIAGNOSIS — S72142A Displaced intertrochanteric fracture of left femur, initial encounter for closed fracture: Secondary | ICD-10-CM | POA: Diagnosis not present

## 2023-02-12 LAB — CBC
HCT: 27.2 % — ABNORMAL LOW (ref 39.0–52.0)
Hemoglobin: 9 g/dL — ABNORMAL LOW (ref 13.0–17.0)
MCH: 30.8 pg (ref 26.0–34.0)
MCHC: 33.1 g/dL (ref 30.0–36.0)
MCV: 93.2 fL (ref 80.0–100.0)
Platelets: 240 10*3/uL (ref 150–400)
RBC: 2.92 MIL/uL — ABNORMAL LOW (ref 4.22–5.81)
RDW: 14 % (ref 11.5–15.5)
WBC: 11.8 10*3/uL — ABNORMAL HIGH (ref 4.0–10.5)
nRBC: 0 % (ref 0.0–0.2)

## 2023-02-12 LAB — MISC LABCORP TEST (SEND OUT): Labcorp test code: 81950

## 2023-02-12 LAB — GLUCOSE, CAPILLARY: Glucose-Capillary: 109 mg/dL — ABNORMAL HIGH (ref 70–99)

## 2023-02-12 MED ORDER — OXYCODONE HCL 5 MG PO TABS: 5.0000 mg | ORAL_TABLET | ORAL | 0 refills | Status: DC | PRN

## 2023-02-12 MED ORDER — APIXABAN 2.5 MG PO TABS: 2.5000 mg | ORAL_TABLET | Freq: Two times a day (BID) | ORAL | 0 refills | Status: DC

## 2023-02-12 MED ORDER — ACETAMINOPHEN 500 MG PO TABS
1000.0000 mg | ORAL_TABLET | Freq: Three times a day (TID) | ORAL | Status: DC | PRN
  Administered 2023-02-12: 1000 mg via ORAL
  Filled 2023-02-12: qty 2

## 2023-02-12 MED ORDER — METHOCARBAMOL 500 MG PO TABS: 500.0000 mg | ORAL_TABLET | Freq: Four times a day (QID) | ORAL | 0 refills | Status: DC | PRN

## 2023-02-12 MED ORDER — VITAMIN D 25 MCG (1000 UNIT) PO TABS: 1000.0000 [IU] | ORAL_TABLET | Freq: Every day | ORAL | 0 refills | Status: AC

## 2023-02-12 MED ORDER — VITAMIN D 25 MCG (1000 UNIT) PO TABS
1000.0000 [IU] | ORAL_TABLET | Freq: Every day | ORAL | Status: DC
  Administered 2023-02-12 – 2023-02-13 (×2): 1000 [IU] via ORAL
  Filled 2023-02-12 (×2): qty 1

## 2023-02-12 NOTE — Progress Notes (Signed)
PROGRESS NOTE  DONEVIN [name] [id number] DOB: Jan 20, 1946 DOA: 02/09/2023 PCP: Celene Squibb, MD   LOS: 3 days   Brief Narrative / Interim history: 77 year old male with HTN, OSA, few years of balance problems seen by neurology and neurosurgery as an outpatient, back pain, comes to the hospital after a fall followed by hip pain.  He was found to have a left intertrochanteric femur fracture, orthopedic surgery was consulted and he was admitted to the hospital  Subjective / 24h Interval events: He is doing well today.  Complains of some back pain in between his shoulder blades after his fall.  Assesement and Plan: Principal Problem:   Intertrochanteric fracture of left femur (HCC) Active Problems:   Abnormality of gait   Essential hypertension, benign   Mixed hyperlipidemia  Principal problem Left intertrochanteric femur fracture -orthopedic surgery consulted, he was taken to the OR on 2/14 and is status post cephalomedullary nailing by Dr. Doreatha Martin.  Continue to monitor in the postoperative setting, orthopedics recommend Eliquis 2.5 mg BID for 30 days, pain control.  PT recommends SNF.  TOC following, stable for discharge when bed is available and he has insurance authorization  Active problems Balance issues -going back at least couple years.  He has been following with neurology as well as neurosurgery.  He has had back surgery in the past.  Overall he does not appreciate significant improvement in the last several years  Bladder tumor -sees urology, status post cystoscopy with transurethral resection by Dr. Gloriann Loan January 2024.  Continue tamsulosin  Essential hypertension-continue home medications as below, blood pressure is acceptable  Hyperlipidemia-continue statin    Scheduled Meds:  carvedilol  3.125 mg Oral BID   cholecalciferol  1,000 Units Oral Daily   docusate sodium  100 mg Oral BID   DULoxetine  60 mg Oral Daily   enoxaparin (LOVENOX) injection  40 mg Subcutaneous  Q24H   gabapentin  300 mg Oral QHS   irbesartan  37.5 mg Oral Daily   pneumococcal 20-valent conjugate vaccine  0.5 mL Intramuscular Tomorrow-1000   pravastatin  10 mg Oral QHS   sodium bicarbonate  650 mg Oral Daily   tamsulosin  0.4 mg Oral Daily   Continuous Infusions:  sodium chloride 100 mL/hr at 02/11/23 1736   methocarbamol (ROBAXIN) IV     PRN Meds:.acetaminophen, HYDROmorphone (DILAUDID) injection, methocarbamol **OR** methocarbamol (ROBAXIN) IV, metoCLOPramide **OR** metoCLOPramide (REGLAN) injection, ondansetron **OR** ondansetron (ZOFRAN) IV, oxyCODONE, polyethylene glycol  Current Outpatient Medications  Medication Instructions   acetaminophen (TYLENOL) 1,000 mg, Oral, At bedtime PRN   albuterol (VENTOLIN HFA) 108 (90 Base) MCG/ACT inhaler 2 puffs, Inhalation, Every 4 hours PRN   apixaban (ELIQUIS) 2.5 mg, Oral, 2 times daily   carvedilol (COREG) 3.125 mg, Oral, 2 times daily   cholecalciferol (VITAMIN D3) 1,000 Units, Oral, Daily   dorzolamide-timolol (COSOPT) 2-0.5 % ophthalmic solution 1 drop, Both Eyes, 2 times daily   DULoxetine (CYMBALTA) 60 MG capsule TAKE 1 CAPSULE(60 MG) BY MOUTH DAILY   gabapentin (NEURONTIN) 300 mg, Oral, Daily at bedtime   guaiFENesin (MUCINEX) 600 mg, Oral, 2 times daily   HYDROcodone-acetaminophen (NORCO) 5-325 MG tablet 1 tablet, Oral, Every 4 hours PRN   latanoprost (XALATAN) 0.005 % ophthalmic solution 1 drop, Both Eyes, Daily at bedtime   methocarbamol (ROBAXIN) 500 mg, Oral, Every 6 hours PRN   oxyCODONE (OXY IR/ROXICODONE) 5 mg, Oral, Every 4 hours PRN   pravastatin (PRAVACHOL) 10 mg, Oral, Daily at bedtime   promethazine-dextromethorphan (PROMETHAZINE-DM)  6.25-15 MG/5ML syrup 5 mLs, Oral, 4 times daily PRN   sodium bicarbonate 650 mg, Oral, Daily   tamsulosin (FLOMAX) 0.4 mg, Oral, Daily   valsartan (DIOVAN) 40 mg, Oral, Daily    Diet Orders (From admission, onward)     Start     Ordered   02/10/23 1825  Diet Heart Room service  appropriate? Yes; Fluid consistency: Thin  Diet effective now       Question Answer Comment  Room service appropriate? Yes   Fluid consistency: Thin      02/10/23 1825            DVT prophylaxis: enoxaparin (LOVENOX) injection 40 mg Start: 02/11/23 0800 SCDs Start: 02/10/23 1825 SCDs Start: 02/09/23 2220   Lab Results  Component Value Date   PLT 240 02/12/2023      Code Status: Full Code  Family Communication: no family at bedside   Status is: Inpatient  Remains inpatient appropriate because: severity of illness  Level of care: Med-Surg  Consultants:  Orthopedic surgery   Objective: Vitals:   02/11/23 2024 02/11/23 2320 02/12/23 0430 02/12/23 0900  BP: 136/74  (!) 142/73 (!) 163/71  Pulse: (!) 105  84 85  Resp: 17  17 16  $ Temp: 98.2 F (36.8 C)  98 F (36.7 C) 98.4 F (36.9 C)  TempSrc: Oral   Oral  SpO2: 95% 95% 96% 95%  Weight:        Intake/Output Summary (Last 24 hours) at 02/12/2023 1110 Last data filed at 02/11/2023 2217 Gross per 24 hour  Intake --  Output 650 ml  Net -650 ml    Wt Readings from Last 3 Encounters:  02/09/23 97.5 kg  01/25/23 97.7 kg  01/18/23 97.7 kg    Examination:  Constitutional: NAD Eyes: lids and conjunctivae normal, no scleral icterus ENMT: mmm Neck: normal, supple Respiratory: clear to auscultation bilaterally, no wheezing, no crackles. Normal respiratory effort.  Cardiovascular: Regular rate and rhythm, no murmurs / rubs / gallops. No LE edema. Abdomen: soft, no distention, no tenderness. Bowel sounds positive.    Data Reviewed: I have independently reviewed following labs and imaging studies   CBC Recent Labs  Lab 02/09/23 2002 02/10/23 0426 02/10/23 1833 02/11/23 0545 02/12/23 0544  WBC 9.4 12.3* 13.7* 10.8* 11.8*  HGB 12.4* 10.9* 11.8* 9.8* 9.0*  HCT 39.1 34.1* 36.1* 29.9* 27.2*  PLT 235 219 215 214 240  MCV 93.1 92.7 92.8 92.3 93.2  MCH 29.5 29.6 30.3 30.2 30.8  MCHC 31.7 32.0 32.7 32.8 33.1   RDW 13.5 13.5 13.8 13.5 14.0  LYMPHSABS 1.5 1.2  --   --   --   MONOABS 0.5 0.6  --   --   --   EOSABS 0.2 0.0  --   --   --   BASOSABS 0.1 0.0  --   --   --      Recent Labs  Lab 02/09/23 2002 02/10/23 0426 02/10/23 1833 02/11/23 0545  NA 141 141  --  137  K 3.6 3.8  --  4.4  CL 105 109  --  105  CO2 25 24  --  22  GLUCOSE 96 116*  --  171*  BUN 27* 26*  --  23  CREATININE 1.82* 1.59* 1.67* 1.74*  CALCIUM 8.5* 8.0*  --  7.8*  AST  --  15  --   --   ALT  --  9  --   --   ALKPHOS  --  77  --   --   BILITOT  --  0.8  --   --   ALBUMIN  --  3.0*  --   --   MG  --  1.6*  --   --   INR 1.0  --   --   --      ------------------------------------------------------------------------------------------------------------------ No results for input(s): "CHOL", "HDL", "LDLCALC", "TRIG", "CHOLHDL", "LDLDIRECT" in the last 72 hours.  Lab Results  Component Value Date   HGBA1C 6.3 12/01/2022   ------------------------------------------------------------------------------------------------------------------ No results for input(s): "TSH", "T4TOTAL", "T3FREE", "THYROIDAB" in the last 72 hours.  Invalid input(s): "FREET3"  Cardiac Enzymes No results for input(s): "CKMB", "TROPONINI", "MYOGLOBIN" in the last 168 hours.  Invalid input(s): "CK" ------------------------------------------------------------------------------------------------------------------ No results found for: "BNP"  CBG: No results for input(s): "GLUCAP" in the last 168 hours.  Recent Results (from the past 240 hour(s))  Surgical pcr screen     Status: None   Collection Time: 02/10/23  2:34 PM   Specimen: Nasal Mucosa; Nasal Swab  Result Value Ref Range Status   MRSA, PCR NEGATIVE NEGATIVE Final   Staphylococcus aureus NEGATIVE NEGATIVE Final    Comment: (NOTE) The Xpert SA Assay (FDA approved for NASAL specimens in patients 30 years of age and older), is one component of a comprehensive surveillance  program. It is not intended to diagnose infection nor to guide or monitor treatment. Performed at [facility], [place] [address], [place], [place] [address]      Radiology Studies: No results found.   Marzetta Board, MD, PhD [facility]  Between 7 am - 7 pm I am available, please contact me via Amion (for emergencies) or Securechat (non urgent messages)  Between 7 pm - 7 am I am not available, please contact night coverage MD/APP via Amion

## 2023-02-12 NOTE — Progress Notes (Signed)
Physical Therapy Treatment Patient Details Name: Zachary [name] MRN: [id number] DOB: 1946/01/20 Today's Date: 02/12/2023   History of Present Illness 77 yo admitted to [facility] 2/13 after fall at [place] with left femur fx. Transfer to [facility] 2/14 for IM nail same date. PMHx: anxiety, bladder CA, HTN, OSA, Parkinsonism, chronic back pain    PT Comments    Pt pleasant, continues to tell jokes during session and motivated to continue to progress. Pt remains apprehensive of pain and resistant to movement at times due to pain. Pt progressing with transfers and able to begin gait today. Education for HEP and progression. Will continue to follow.     Recommendations for follow up therapy are one component of a multi-disciplinary discharge planning process, led by the attending physician.  Recommendations may be updated based on patient status, additional functional criteria and insurance authorization.  Follow Up Recommendations  Skilled nursing-short term rehab (<3 hours/day) Can patient physically be transported by private vehicle: No   Assistance Recommended at Discharge Frequent or constant Supervision/Assistance  Patient can return home with the following Two people to help with walking and/or transfers;A lot of help with bathing/dressing/bathroom;Assist for transportation;Assistance with cooking/housework   Equipment Recommendations  Rolling walker (2 wheels);BSC/3in1    Recommendations for Other Services       Precautions / Restrictions Precautions Precautions: Fall Restrictions LLE Weight Bearing: Weight bearing as tolerated     Mobility  Bed Mobility Overal bed mobility: Needs Assistance Bed Mobility: Supine to Sit     Supine to sit: HOB elevated, Mod assist     General bed mobility comments: HOB 40 degrees with mod assist to pivot to EOB with use of pad, increased time and cues with assist for LLE    Transfers Overall transfer level: Needs assistance   Transfers: Sit  to/from Stand Sit to Stand: Min assist, From elevated surface, +2 physical assistance           General transfer comment: min +2 to rise from elevated bed with cues for hand placement and safety    Ambulation/Gait Ambulation/Gait assistance: Min assist, +2 safety/equipment Gait Distance (Feet): 15 Feet Assistive device: Rolling walker (2 wheels) Gait Pattern/deviations: Step-to pattern   Gait velocity interpretation: <1.8 ft/sec, indicate of risk for recurrent falls   General Gait Details: pt with cues for sequence, physical assist to advance LLE and chair follow, slow gait   Stairs             Wheelchair Mobility    Modified Rankin (Stroke Patients Only)       Balance Overall balance assessment: Needs assistance Sitting-balance support: Feet supported Sitting balance-Leahy Scale: Fair     Standing balance support: During functional activity, Reliant on assistive device for balance, Bilateral upper extremity supported Standing balance-Leahy Scale: Zero Standing balance comment: Pt needs BUE support on the walker for balance.                            Cognition Arousal/Alertness: Awake/alert Behavior During Therapy: WFL for tasks assessed/performed Overall Cognitive Status: Within Functional Limits for tasks assessed                                          Exercises General Exercises - Lower Extremity Long Arc Quad: Left, Seated, 15 reps, AAROM Hip Flexion/Marching: AAROM, Left, Seated, 15 reps  General Comments        Pertinent Vitals/Pain Pain Assessment Pain Score: 7  Pain Location: left hip Pain Descriptors / Indicators: Aching, Guarding Pain Intervention(s): Limited activity within patient's tolerance, Repositioned, Monitored during session, Premedicated before session    Home Living                          Prior Function            PT Goals (current goals can now be found in the care plan  section) Progress towards PT goals: Progressing toward goals    Frequency    Min 4X/week      PT Plan Current plan remains appropriate    Co-evaluation              AM-PAC PT "6 Clicks" Mobility   Outcome Measure  Help needed turning from your back to your side while in a flat bed without using bedrails?: A Lot Help needed moving from lying on your back to sitting on the side of a flat bed without using bedrails?: A Lot Help needed moving to and from a bed to a chair (including a wheelchair)?: A Lot Help needed standing up from a chair using your arms (e.g., wheelchair or bedside chair)?: Total Help needed to walk in hospital room?: Total Help needed climbing 3-5 steps with a railing? : Total 6 Click Score: 9    End of Session Equipment Utilized During Treatment: Gait belt Activity Tolerance: Patient tolerated treatment well Patient left: in chair;with call bell/phone within reach;with chair alarm set Nurse Communication: Mobility status;Weight bearing status;Precautions PT Visit Diagnosis: Other abnormalities of gait and mobility (R26.89);Muscle weakness (generalized) (M62.81)     Time: [id number] PT Time Calculation (min) (ACUTE ONLY): 28 min  Charges:  $Gait Training: 8-22 mins $Therapeutic Activity: 8-22 mins                     Bayard Males, PT Acute Rehabilitation Services Office: [place] 02/12/2023, 9:07 AM

## 2023-02-12 NOTE — Progress Notes (Signed)
Orthopaedic Trauma Progress Note  SUBJECTIVE: Doing fairly well this morning. Pain controlled at rest, increases with mobility. Was able to get to bedside chair with therapies yesterday. Remains agreeable to SNF.   OBJECTIVE:  Vitals:   02/11/23 2320 02/12/23 0430  BP:  (!) 142/73  Pulse:  84  Resp:  17  Temp:  98 F (36.7 C)  SpO2: 95% 96%    General: Sitting up in bed, no acute distress.  Respiratory: No increased work of breathing.  LLE: Dressings removed, incisions are clean, dry, intact.  No significant tenderness with palpation over the hip or throughout the thigh.  Ankle dorsiflexion and plantarflexion are intact.  Sensation intact to light touch distally.  2+ DP pulse  IMAGING: Stable post op imaging.   LABS:  Results for orders placed or performed during the hospital encounter of 02/09/23 (from the past 24 hour(s))  Miscellaneous [facility] test (send-out)     Status: None   Collection Time: 02/11/23  9:33 AM  Result Value Ref Range   [facility] test code [id number]    [facility] test name [id number]    Source ([facility]) 1ML SERUM    Misc [facility] result COMMENT   CBC     Status: Abnormal   Collection Time: 02/12/23  5:44 AM  Result Value Ref Range   WBC 11.8 (H) 4.0 - 10.5 K/uL   RBC 2.92 (L) 4.22 - 5.81 MIL/uL   Hemoglobin 9.0 (L) 13.0 - 17.0 g/dL   HCT 27.2 (L) 39.0 - 52.0 %   MCV 93.2 80.0 - 100.0 fL   MCH 30.8 26.0 - 34.0 pg   MCHC 33.1 30.0 - 36.0 g/dL   RDW 14.0 11.5 - 15.5 %   Platelets 240 150 - 400 K/uL   nRBC 0.0 0.0 - 0.2 %    ASSESSMENT: Zachary [name] is a 77 y.o. male, 2 Days Post-Op s/p INTRAMEDULLARY NAILING OF LEFT FEMUR  CV/Blood loss: Acute blood loss anemia, Hgb 9.0 this AM. Hemodynamically stable  PLAN: Weightbearing: WBAT LLE ROM: Okay for unrestricted hip and knee range of motion as tolerated Incisional and dressing care: Removed today. Change PRN Showering: Okay to begin showering getting incisions wet 02/13/2023 Orthopedic device(s): None  Pain  management:  1. Tylenol 650 mg q 6 hours scheduled 2. Robaxin 500 mg q 6 hours PRN 3. Oxycodone 5 mg q 4 hours PRN 4. Dilaudid 1 mg q 3 hours PRN 5.  Neurontin 300 mg nightly VTE prophylaxis: Lovenox, SCDs ID:  Ancef 2gm post op Foley/Lines:  No foley, KVO IVFs Impediments to Fracture Healing: Vitamin D level 24, will start supplementation  Dispo: PT/OT recommending SNF.  Okay for discharge from ortho standpoint once cleared by medicine team and therapies  D/C recommendations: have signed and placed these in the chart -Oxycodone and Robaxin for pain control -Eliquis 2.5 mg twice daily x 30 days for DVT prophylaxis -Continue Vit D supplementation  Follow - up plan: 2 weeks after discharge for wound check and repeat x-rays   Contact information:  Katha Hamming MD, Rushie Nyhan PA-C. After hours and holidays please check [email or web address] for group call information for [facility]   Gwinda Passe, PA-C [phone] (office) [email or web address]

## 2023-02-12 NOTE — Care Management Important Message (Signed)
Important Message  Patient Details  Name: Zachary [name] MRN: [id number] Date of Birth: 12-24-1946   Medicare Important Message Given:        [name] 02/12/2023, 2:11 PM

## 2023-02-12 NOTE — TOC CAGE-AID Note (Signed)
Transition of Care [facility]) - CAGE-AID Screening   Patient Details  Name: Zachary [name] MRN: [id number] Date of Birth: [birth year]  Transition [facility]) CM/SW Contact:    Clovis Cao, RN Phone Number: [phone] 02/12/2023, 6:06 PM   Clinical Narrative: Pt denies alcohol or drug use.  Screening complete.   CAGE-AID Screening:    Have You Ever Felt You Ought to Cut Down on Your Drinking or Drug Use?: No Have People Annoyed You By Critizing Your Drinking Or Drug Use?: No Have You Felt Bad Or Guilty About Your Drinking Or Drug Use?: No Have You Ever Had a Drink or Used Drugs First Thing In The Morning to Steady Your Nerves or to Get Rid of a Hangover?: No CAGE-AID Score: 0  Substance Abuse Education Offered: No

## 2023-02-12 NOTE — Plan of Care (Signed)

## 2023-02-12 NOTE — TOC Progression Note (Addendum)
Transition of Care [facility]) - Progression Note    Patient Details  Name: Zachary [name] MRN: [id number] Date of Birth: [birth year]  Transition of Care [facility]) CM/SW [place], RN Phone Number: 02/12/2023, 10:58 AM  Clinical Narrative:    CM met with the patient at the bedside and provided Medicare choice regarding SNf facility choice and he chose [facility].  I called the insurance provider and insurance authorization was started for both SNF placement and PTAR transport - authorization is pending at this time.  Attending MD was updated for pending authorization and likely discharge to the facility tomorrow if insurance authorization approval is received.  I  spoke with Elisabeth Most, MSW at [facility] and she can admit the patient tomorrow as long as she receives discharge summary by 11 am tomorrow.  The patient has CPAP machine and mask in the hospital room.  MD to place instructions in the diischarge summary for the facility.  02/12/23 1439 - CM spoke with [employer] insurance company and the patient has been approved for SNF placement at [facility] - potential discharge to facility tomorrow as long as discharge summary can be uploaded to the facility by 11 am - MD aware. [place] approval # for SNF - [id number],  PTAR approval # [id number] - approved for 7 days.     Expected Discharge Plan: [place] Barriers to Discharge: Continued Medical Work up  Expected Discharge Plan and Services   Discharge Planning Services: CM Consult Post Acute Care Choice: [place] Living arrangements for the past 2 months: Single Family Home                                       Social Determinants of Health (SDOH) Interventions SDOH Screenings   Food Insecurity: No Food Insecurity (02/10/2023)  Housing: Low Risk  (02/10/2023)  Transportation Needs: No Transportation Needs (02/10/2023)  Utilities:  Not At Risk (02/10/2023)  Depression (PHQ2-9): Low Risk  (03/02/2022)  Tobacco Use: Medium Risk (02/11/2023)    Readmission Risk Interventions    02/11/2023   12:02 PM  Readmission Risk Prevention Plan  Transportation Screening Complete  PCP or Specialist Appt within 5-7 Days Complete  Home Care Screening Complete  Medication Review (RN CM) Complete

## 2023-02-13 DIAGNOSIS — E860 Dehydration: Secondary | ICD-10-CM | POA: Diagnosis not present

## 2023-02-13 DIAGNOSIS — E7849 Other hyperlipidemia: Secondary | ICD-10-CM | POA: Diagnosis not present

## 2023-02-13 DIAGNOSIS — Z9989 Dependence on other enabling machines and devices: Secondary | ICD-10-CM | POA: Diagnosis not present

## 2023-02-13 DIAGNOSIS — Z23 Encounter for immunization: Secondary | ICD-10-CM | POA: Diagnosis not present

## 2023-02-13 DIAGNOSIS — Z96642 Presence of left artificial hip joint: Secondary | ICD-10-CM | POA: Diagnosis not present

## 2023-02-13 DIAGNOSIS — Z7901 Long term (current) use of anticoagulants: Secondary | ICD-10-CM | POA: Diagnosis not present

## 2023-02-13 DIAGNOSIS — R41841 Cognitive communication deficit: Secondary | ICD-10-CM | POA: Diagnosis not present

## 2023-02-13 DIAGNOSIS — S72142D Displaced intertrochanteric fracture of left femur, subsequent encounter for closed fracture with routine healing: Secondary | ICD-10-CM | POA: Diagnosis not present

## 2023-02-13 DIAGNOSIS — R29898 Other symptoms and signs involving the musculoskeletal system: Secondary | ICD-10-CM | POA: Diagnosis not present

## 2023-02-13 DIAGNOSIS — D72829 Elevated white blood cell count, unspecified: Secondary | ICD-10-CM | POA: Diagnosis not present

## 2023-02-13 DIAGNOSIS — R2689 Other abnormalities of gait and mobility: Secondary | ICD-10-CM | POA: Diagnosis not present

## 2023-02-13 DIAGNOSIS — R7989 Other specified abnormal findings of blood chemistry: Secondary | ICD-10-CM | POA: Diagnosis not present

## 2023-02-13 DIAGNOSIS — I4510 Unspecified right bundle-branch block: Secondary | ICD-10-CM | POA: Diagnosis not present

## 2023-02-13 DIAGNOSIS — M1990 Unspecified osteoarthritis, unspecified site: Secondary | ICD-10-CM | POA: Diagnosis not present

## 2023-02-13 DIAGNOSIS — D649 Anemia, unspecified: Secondary | ICD-10-CM | POA: Diagnosis not present

## 2023-02-13 DIAGNOSIS — N179 Acute kidney failure, unspecified: Secondary | ICD-10-CM | POA: Diagnosis not present

## 2023-02-13 DIAGNOSIS — F419 Anxiety disorder, unspecified: Secondary | ICD-10-CM | POA: Diagnosis not present

## 2023-02-13 DIAGNOSIS — W19XXXA Unspecified fall, initial encounter: Secondary | ICD-10-CM | POA: Diagnosis not present

## 2023-02-13 DIAGNOSIS — R55 Syncope and collapse: Secondary | ICD-10-CM | POA: Diagnosis not present

## 2023-02-13 DIAGNOSIS — G4733 Obstructive sleep apnea (adult) (pediatric): Secondary | ICD-10-CM | POA: Diagnosis not present

## 2023-02-13 DIAGNOSIS — E782 Mixed hyperlipidemia: Secondary | ICD-10-CM | POA: Diagnosis not present

## 2023-02-13 DIAGNOSIS — D62 Acute posthemorrhagic anemia: Secondary | ICD-10-CM | POA: Diagnosis not present

## 2023-02-13 DIAGNOSIS — F29 Unspecified psychosis not due to a substance or known physiological condition: Secondary | ICD-10-CM | POA: Diagnosis not present

## 2023-02-13 DIAGNOSIS — R531 Weakness: Secondary | ICD-10-CM | POA: Diagnosis not present

## 2023-02-13 DIAGNOSIS — M25512 Pain in left shoulder: Secondary | ICD-10-CM | POA: Diagnosis not present

## 2023-02-13 DIAGNOSIS — S72142A Displaced intertrochanteric fracture of left femur, initial encounter for closed fracture: Secondary | ICD-10-CM | POA: Diagnosis not present

## 2023-02-13 DIAGNOSIS — Z9181 History of falling: Secondary | ICD-10-CM | POA: Diagnosis not present

## 2023-02-13 DIAGNOSIS — W010XXA Fall on same level from slipping, tripping and stumbling without subsequent striking against object, initial encounter: Secondary | ICD-10-CM | POA: Diagnosis not present

## 2023-02-13 DIAGNOSIS — N3001 Acute cystitis with hematuria: Secondary | ICD-10-CM | POA: Diagnosis not present

## 2023-02-13 DIAGNOSIS — Z79899 Other long term (current) drug therapy: Secondary | ICD-10-CM | POA: Diagnosis not present

## 2023-02-13 DIAGNOSIS — I10 Essential (primary) hypertension: Secondary | ICD-10-CM | POA: Diagnosis not present

## 2023-02-13 DIAGNOSIS — M6281 Muscle weakness (generalized): Secondary | ICD-10-CM | POA: Diagnosis not present

## 2023-02-13 DIAGNOSIS — Z7401 Bed confinement status: Secondary | ICD-10-CM | POA: Diagnosis not present

## 2023-02-13 DIAGNOSIS — G894 Chronic pain syndrome: Secondary | ICD-10-CM | POA: Diagnosis not present

## 2023-02-13 DIAGNOSIS — F32A Depression, unspecified: Secondary | ICD-10-CM | POA: Diagnosis not present

## 2023-02-13 LAB — CBC
HCT: 24.4 % — ABNORMAL LOW (ref 39.0–52.0)
Hemoglobin: 7.7 g/dL — ABNORMAL LOW (ref 13.0–17.0)
MCH: 29.8 pg (ref 26.0–34.0)
MCHC: 31.6 g/dL (ref 30.0–36.0)
MCV: 94.6 fL (ref 80.0–100.0)
Platelets: 234 10*3/uL (ref 150–400)
RBC: 2.58 MIL/uL — ABNORMAL LOW (ref 4.22–5.81)
RDW: 14.1 % (ref 11.5–15.5)
WBC: 9 10*3/uL (ref 4.0–10.5)
nRBC: 0 % (ref 0.0–0.2)

## 2023-02-13 LAB — COMPREHENSIVE METABOLIC PANEL
ALT: 5 U/L (ref 0–44)
AST: 15 U/L (ref 15–41)
Albumin: 2.2 g/dL — ABNORMAL LOW (ref 3.5–5.0)
Alkaline Phosphatase: 55 U/L (ref 38–126)
Anion gap: 8 (ref 5–15)
BUN: 29 mg/dL — ABNORMAL HIGH (ref 8–23)
CO2: 23 mmol/L (ref 22–32)
Calcium: 8.3 mg/dL — ABNORMAL LOW (ref 8.9–10.3)
Chloride: 108 mmol/L (ref 98–111)
Creatinine, Ser: 1.66 mg/dL — ABNORMAL HIGH (ref 0.61–1.24)
GFR, Estimated: 42 mL/min — ABNORMAL LOW (ref >60)
Glucose, Bld: 110 mg/dL — ABNORMAL HIGH (ref 70–99)
Potassium: 3.9 mmol/L (ref 3.5–5.1)
Sodium: 139 mmol/L (ref 135–145)
Total Bilirubin: 0.2 mg/dL — ABNORMAL LOW (ref 0.3–1.2)
Total Protein: 4.9 g/dL — ABNORMAL LOW (ref 6.5–8.1)

## 2023-02-13 LAB — MAGNESIUM: Magnesium: 1.6 mg/dL — ABNORMAL LOW (ref 1.7–2.4)

## 2023-02-13 MED ORDER — MAGNESIUM SULFATE 2 GM/50ML IV SOLN
2.0000 g | Freq: Once | INTRAVENOUS | Status: AC
  Administered 2023-02-13: 2 g via INTRAVENOUS
  Filled 2023-02-13: qty 50

## 2023-02-13 NOTE — NC FL2 (Addendum)
[place] LEVEL OF CARE FORM     IDENTIFICATION  Patient Name: Zachary [name] Birthdate: Jun 18, 1946 Sex: male Admission Date (Current Location): 02/09/2023  [place] and [place] Number:  Herbalist and Address:  The . [facility], [place] [address] [place], [place] [address]      Provider Number: [id number]  Attending Physician Name and Address:  Caren Griffins, MD  Relative Name and Phone Number:  Mattis Perl, spouse - [phone]    Current Level of Care: Hospital Recommended Level of Care: [place] Prior Approval Number:    Date Approved/Denied:   PASRR Number: [id number]  Discharge Plan: SNF    Current Diagnoses: Patient Active Problem List   Diagnosis Date Noted   Intertrochanteric fracture of left femur ([place]) 02/09/2023   Left ureteral stone 02/12/2020   Subclinical hyperthyroidism 09/20/2018   Prediabetes 09/20/2018   S/P total knee replacement, 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 02/07/2018   Primary osteoarthritis of left knee 02/01/2018   Postural hypotension 07/23/2016   Dysphagia 07/23/2016   Obstructive sleep apnea 07/21/2016   Hyperglycemia 07/21/2016   Anemia, unspecified 07/21/2016   Spinal stenosis of lumbar region 07/15/2016   Bladder tumor 07/27/2015   Epiretinal membrane (ERM) of left eye 03/14/2014   H/O retinal detachment 03/14/2014   Pseudophakia of both eyes 03/14/2014   Retinal detachment 03/14/2014   Visual distortion 03/14/2014   HNP (herniated nucleus pulposus), cervical 06/05/2013   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   Muscle spasms of neck 04/04/2013   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 anxiety 03/01/2013   OA (osteoarthritis) of knee 02/22/2013   Iliotibial band syndrome of left side 11/22/2012   Localized, primary osteoarthritis of the ankle and foot 10/11/2012   Difficulty in walking(719.7) 08/17/2012   Peroneal tendinitis 08/16/2012   Precordial pain 02/09/2012   Essential hypertension, benign  02/09/2012   Mixed hyperlipidemia 02/09/2012   Dysthymia 02/04/2012   Abnormality of gait 12/23/2011   Lateral epicondylitis/tennis elbow 05/06/2011   TENOSYNOVITIS OF FOOT AND ANKLE 10/22/2010    Orientation RESPIRATION BLADDER Height & Weight     Self, Time, Situation, Place  Normal External catheter Weight: 215 lb (97.5 kg) Height:     BEHAVIORAL SYMPTOMS/MOOD NEUROLOGICAL BOWEL NUTRITION STATUS      Continent Diet  AMBULATORY STATUS COMMUNICATION OF NEEDS Skin   Extensive Assist Verbally Surgical wounds (Left leg surgical incision)                       Personal Care Assistance Level of Assistance  Bathing, Feeding, Dressing Bathing Assistance: Limited assistance Feeding assistance: Independent Dressing Assistance: Limited assistance     Functional Limitations Info  Sight, Hearing, Speech Sight Info: Impaired (wears glasses) Hearing Info: Adequate Speech Info: Adequate    SPECIAL CARE FACTORS FREQUENCY  PT (By licensed PT), OT (By licensed OT)     PT Frequency: 5 x per week OT Frequency: 5 x per week            Contractures Contractures Info: Not present    Additional Factors Info  Code Status, Allergies, Psychotropic Code Status Info: Full code Allergies Info: Poison oak, sulfa antibiotics, sulfonamide derivative Psychotropic Info: Cymbalta         Current Medications (02/13/2023):  This is the current hospital active medication list Current Facility-Administered Medications  Medication Dose Route Frequency Provider Last Rate Last Admin   0.9 %  sodium chloride infusion   Intravenous Continuous Corinne Ports, PA-C  100 mL/hr at 02/11/23 1736 New Bag at 02/11/23 1736   acetaminophen (TYLENOL) tablet 1,000 mg  1,000 mg Oral Q8H PRN Shela Leff, MD   1,000 mg at 02/12/23 0522   carvedilol (COREG) tablet 3.125 mg  3.125 mg Oral BID Corinne Ports, PA-C   3.125 mg at 02/13/23 0844   cholecalciferol (VITAMIN D3) 25 MCG (1000 UNIT) tablet 1,000  Units  1,000 Units Oral Daily Corinne Ports, PA-C   1,000 Units at 02/13/23 [id number]   docusate sodium (COLACE) capsule 100 mg  100 mg Oral BID Corinne Ports, PA-C   100 mg at 02/13/23 0843   DULoxetine (CYMBALTA) DR capsule 60 mg  60 mg Oral Daily Corinne Ports, PA-C   60 mg at 02/13/23 0843   enoxaparin (LOVENOX) injection 40 mg  40 mg Subcutaneous Q24H Corinne Ports, PA-C   40 mg at 02/13/23 [id number]   gabapentin (NEURONTIN) capsule 300 mg  300 mg Oral QHS Corinne Ports, PA-C   300 mg at 02/12/23 2052   HYDROmorphone (DILAUDID) injection 1 mg  1 mg Intravenous Q3H PRN Corinne Ports, PA-C   1 mg at 02/13/23 0457   irbesartan (AVAPRO) tablet 37.5 mg  37.5 mg Oral Daily Corinne Ports, PA-C   37.5 mg at 02/13/23 [id number]   methocarbamol (ROBAXIN) tablet 500 mg  500 mg Oral Q6H PRN Corinne Ports, PA-C   500 mg at 02/11/23 2124   Or   methocarbamol (ROBAXIN) 500 mg in dextrose 5 % 50 mL IVPB  500 mg Intravenous Q6H PRN Corinne Ports, PA-C       metoCLOPramide (REGLAN) tablet 5-10 mg  5-10 mg Oral Q8H PRN Thereasa Solo, Sarah A, PA-C       Or   metoCLOPramide (REGLAN) injection 5-10 mg  5-10 mg Intravenous Q8H PRN Corinne Ports, PA-C       ondansetron (ZOFRAN) tablet 4 mg  4 mg Oral Q6H PRN Corinne Ports, PA-C       Or   ondansetron (ZOFRAN) injection 4 mg  4 mg Intravenous Q6H PRN Corinne Ports, PA-C   4 mg at 02/10/23 [id number]   oxyCODONE (Oxy IR/ROXICODONE) immediate release tablet 5 mg  5 mg Oral Q4H PRN Corinne Ports, PA-C   5 mg at 02/12/23 1742   polyethylene glycol (MIRALAX / GLYCOLAX) packet 17 g  17 g Oral Daily PRN Rushie Nyhan A, PA-C       pravastatin (PRAVACHOL) tablet 10 mg  10 mg Oral QHS Rushie Nyhan A, PA-C   10 mg at 02/12/23 2052   sodium bicarbonate tablet 650 mg  650 mg Oral Daily Corinne Ports, PA-C   650 mg at 02/13/23 [id number]   tamsulosin (FLOMAX) capsule 0.4 mg  0.4 mg Oral Daily Corinne Ports, PA-C   0.4 mg at 02/13/23 [id number]     Discharge  Medications: Please see discharge summary for a list of discharge medications.  Relevant Imaging Results:  Relevant Lab Results:   Additional Information SSN: [place], [place], [place]

## 2023-02-13 NOTE — Discharge Summary (Signed)
Physician Discharge Summary  Zachary [name] [id number] DOB: 07-Aug-1946 DOA: 02/09/2023  PCP: Zachary Squibb, MD  Admit date: 02/09/2023 Discharge date: 02/13/2023  Admitted From: home Disposition:  SNF  Recommendations for Outpatient Follow-up:  Follow up with PCP in 1-2 weeks Please obtain BMP/CBC in 3-4 days  Home Health: none Equipment/Devices: none  Discharge Condition: stable CODE STATUS: FUll code Diet Orders (From admission, onward)     Start     Ordered   02/10/23 1825  Diet Heart Room service appropriate? Yes; Fluid consistency: Thin  Diet effective now       Question Answer Comment  Room service appropriate? Yes   Fluid consistency: Thin      02/10/23 1825            HPI: Per admitting MD, Zachary [name] is a 77 y.o. male with medical history significant of anxiety, arthritis, chronic pain, depression, hypertension, obstructive sleep apnea, syncopal episodes, and more presents the ED with chief complaint of fall.  Patient reports that he fell and broke his hip.  He was so she did lost his balance when he fell backwards.  He reports he hit his head, but did not blackout.  He is not on any blood thinners.  Patient has been off balance now for about a year.  He is following with outpatient neurology.  He did recently started on gabapentin which has made him more fatigued according to the wife.  She feels his balance has been worse since starting gabapentin as well.  They both report that the balance issues have been worse over the whole year so they may not actually be a correlation with the gabapentin which started a few weeks ago.  Patient has not been started on any medications for dizziness.  He has seen neurology who reported that he did not meet criteria for Parkinson's.  He has had some MRIs of his cervical spine and thoracic spine, but has not yet followed up with neurology for the results of those.  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  He  does still experience dysuria from that.  He no longer has hematuria.  Patient reports that today, when he fell he had sudden onset of severe pain.  The pain is sharp and is located on his left hip.  There is a few touches lower left leg, but upon further questioning it does not actually [place] down there.  Patient reports otherwise he has been in his normal state of health.   Hospital Course / Discharge diagnoses: Principal Problem:   Intertrochanteric fracture of left femur [facility]) Active Problems:   Abnormality of gait   Essential hypertension, benign   Mixed hyperlipidemia   Principal problem Left intertrochanteric femur fracture -orthopedic surgery consulted, he was taken to the OR on 2/14 and is status post cephalomedullary nailing by Dr. Doreatha [name].  Continue to monitor in the postoperative setting, orthopedics recommend Eliquis 2.5 mg BID for 30 days, pain control.  PT recommends SNF.     Active problems Balance issues -going back at least couple years.  He has been following with neurology as well as neurosurgery.  He has had back surgery in the past.  Overall he does not appreciate significant improvement in the last several years Acute blood loss anemia -postoperatively, also dilutional component.  No bleeding, did not need transfusions, continue to monitor hemoglobin Bladder tumor -sees urology, status post cystoscopy with transurethral resection by Zachary [name] January 2024.  Continue tamsulosin Essential hypertension-continue home medications   Hyperlipidemia-continue statin OSA -continue home CPAP.  Patient reports that his CPAP settings are 4.5  Sepsis ruled out   Discharge Instructions   Allergies as of 02/13/2023       Reactions   Poison Oak Extract Hives, Itching   Sulfonamide Derivatives Itching, Rash   Sulfa Antibiotics         Medication List     STOP taking these medications    HYDROcodone-acetaminophen 5-325 MG tablet Commonly known as: Norco       TAKE  these medications    acetaminophen 500 MG tablet Commonly known as: TYLENOL Take 1,000 mg by mouth at bedtime as needed for moderate pain.   albuterol 108 (90 Base) MCG/ACT inhaler Commonly known as: VENTOLIN HFA Inhale 2 puffs into the lungs every 4 (four) hours as needed for wheezing or shortness of breath.   apixaban 2.5 MG Tabs tablet Commonly known as: Eliquis Take 1 tablet (2.5 mg total) by mouth 2 (two) times daily.   carvedilol 3.125 MG tablet Commonly known as: COREG Take 3.125 mg by mouth 2 (two) times daily.   cholecalciferol 25 MCG (1000 UNIT) tablet Commonly known as: VITAMIN D3 Take 1 tablet (1,000 Units total) by mouth daily.   dorzolamide-timolol 2-0.5 % ophthalmic solution Commonly known as: COSOPT Place 1 drop into both eyes 2 (two) times daily.   DULoxetine 60 MG capsule Commonly known as: CYMBALTA TAKE 1 CAPSULE(60 MG) BY MOUTH DAILY   gabapentin 300 MG capsule Commonly known as: NEURONTIN Take 300 mg by mouth at bedtime.   guaiFENesin 600 MG 12 hr tablet Commonly known as: Mucinex Take 1 tablet (600 mg total) by mouth 2 (two) times daily.   latanoprost 0.005 % ophthalmic solution Commonly known as: XALATAN Place 1 drop into both eyes at bedtime.   methocarbamol 500 MG tablet Commonly known as: ROBAXIN Take 1 tablet (500 mg total) by mouth every 6 (six) hours as needed for muscle spasms.   oxyCODONE 5 MG immediate release tablet Commonly known as: Oxy IR/ROXICODONE Take 1 tablet (5 mg total) by mouth every 4 (four) hours as needed for moderate pain or severe pain.   pravastatin 10 MG tablet Commonly known as: PRAVACHOL Take 10 mg by mouth at bedtime.   promethazine-dextromethorphan 6.25-15 MG/5ML syrup Commonly known as: PROMETHAZINE-DM Take 5 mLs by mouth 4 (four) times daily as needed.   sodium bicarbonate 650 MG tablet Take 650 mg by mouth daily.   tamsulosin 0.4 MG Caps capsule Commonly known as: FLOMAX Take 0.4 mg by mouth  daily.   valsartan 40 MG tablet Commonly known as: DIOVAN Take 40 mg by mouth daily.        Contact information for after-discharge care     Destination     [facility] .   Service: Skilled Nursing Contact information: [address] [place] [phone]                    Consultations: Orthopedic surgery   Procedures/Studies:  DG HIP PORT UNILAT W OR W/O PELVIS 1V LEFT  Result Date: 02/10/2023 CLINICAL DATA:  LEFT hip fracture EXAM: DG HIP (WITH OR WITHOUT PELVIS) 1V PORT LEFT COMPARISON:  02/09/2023 FINDINGS: Intramedullary nail fixation of the proximal femur. Compression screw spans the LEFT femoral neck. Distal locking screw noted in the distal medullary nail. Avulsion of the lesser trochanter. IMPRESSION: Intramedullary nail fixation LEFT intertrochanteric femur  fracture Electronically Signed   By: Suzy Bouchard M.D.   On: 02/10/2023 18:35   DG HIP UNILAT WITH PELVIS 1V LEFT  Result Date: 02/10/2023 CLINICAL DATA:  Elective surgery EXAM: DG HIP (WITH OR WITHOUT PELVIS) 1V*L* COMPARISON:  Left hip x-ray 02/09/2023 FINDINGS: Six intraoperative fluoroscopic views of the left hip. There is a left-sided hip screw and intramedullary nail fixating a comminuted intratrochanteric fracture. Alignment is anatomic. Fluoroscopy time 52 seconds. Dose 9.10 micro gray. IMPRESSION: Left hip screw and intramedullary nail fixating a comminuted intratrochanteric fracture. Electronically Signed   By: Ronney Asters M.D.   On: 02/10/2023 17:46   DG C-Arm 1-60 Min-No Report  Result Date: 02/10/2023 Fluoroscopy was utilized by the requesting physician.  No radiographic interpretation.   CT CERVICAL SPINE WO CONTRAST  Result Date: 02/09/2023 CLINICAL DATA:  Poly trauma blunt. [employer]. Hematoma to the back of the head. EXAM: CT CERVICAL SPINE WITHOUT CONTRAST TECHNIQUE: Multidetector CT imaging of the cervical spine was  performed without intravenous contrast. Multiplanar CT image reconstructions were also generated. RADIATION DOSE REDUCTION: This exam was performed according to the departmental dose-optimization program which includes automated exposure control, adjustment of the mA and/or kV according to patient size and/or use of iterative reconstruction technique. COMPARISON:  CT 08/26/2021.  MRI 01/20/2023 FINDINGS: Alignment: Normal alignment. Skull base and vertebrae: Skull base appears intact. No vertebral compression deformities. No focal bone lesion or bone destruction. Soft tissues and spinal canal: No prevertebral soft tissue swelling. No abnormal paraspinal soft tissue mass or infiltration. Disc levels: Degenerative changes throughout with narrowed disc spaces and endplate osteophyte formation. Bridging osteophytes in the lower cervical region. Postoperative changes with anterior plate and screw fixation and intervertebral fusion at C3-4. Degenerative changes in the facet joints. Upper chest: Lung apices are clear. Other: None. IMPRESSION: Normal alignment. Postoperative and degenerative changes as discussed. No acute displaced fractures are identified. Electronically Signed   By: Lucienne Capers M.D.   On: 02/09/2023 20:43   CT HEAD WO CONTRAST  Result Date: 02/09/2023 CLINICAL DATA:  Poly trauma, blunt. Fell at [employer]. Hematoma to the back of the head. Patient is on blood thinners. No loss of consciousness. EXAM: CT HEAD WITHOUT CONTRAST TECHNIQUE: Contiguous axial images were obtained from the base of the skull through the vertex without intravenous contrast. RADIATION DOSE REDUCTION: This exam was performed according to the departmental dose-optimization program which includes automated exposure control, adjustment of the mA and/or kV according to patient size and/or use of iterative reconstruction technique. COMPARISON:  MRI brain 07/19/2021.  CT head 10/06/2012 FINDINGS: Brain: No evidence of acute  infarction, hemorrhage, hydrocephalus, extra-axial collection or mass lesion/mass effect. Mild cerebral atrophy. Patchy low-attenuation changes in the deep white matter consistent with small vessel ischemia. Vascular: No hyperdense vessel or unexpected calcification. Skull: Normal. Negative for fracture or focal lesion. Sinuses/Orbits: Paranasal sinuses and mastoid air cells are clear. Other: Subcutaneous scalp hematoma over the posterior vertex. IMPRESSION: No acute intracranial abnormalities. Mild chronic atrophy and small vessel ischemic changes. Electronically Signed   By: Lucienne Capers M.D.   On: 02/09/2023 20:41   DG Pelvis 1-2 Views  Result Date: 02/09/2023 CLINICAL DATA:  [place] at [employer]. Pain and rotation of the left leg. EXAM: PELVIS - 1-2 VIEW COMPARISON:  Abdominal radiograph 04/10/2020 FINDINGS: Acute comminuted fracture of the inter trochanteric left proximal femur with displaced lesser trochanteric fragment and varus angulation of the fracture fragments. Degenerative changes in the bilateral hips. Pelvis and sacrum appear  intact. Postoperative and degenerative changes in the lower lumbar spine. IMPRESSION: Acute comminuted fracture of the inter trochanteric left femur with varus angulation. Electronically Signed   By: Lucienne Capers M.D.   On: 02/09/2023 20:38   DG Chest 1 View  Result Date: 02/09/2023 CLINICAL DATA:  Preoperative.  Fell at [employer]. EXAM: CHEST  1 VIEW COMPARISON:  11/13/2022 FINDINGS: Shallow inspiration. Heart size and pulmonary vascularity are normal. Lungs are clear. No pleural effusions. No pneumothorax. Mediastinal contours appear intact. Degenerative changes in the spine and shoulders. IMPRESSION: No active disease. Electronically Signed   By: Lucienne Capers M.D.   On: 02/09/2023 20:37   DG FEMUR MIN 2 VIEWS LEFT  Result Date: 02/09/2023 CLINICAL DATA:  [place] at [employer]. Pain and rotation of the left leg. EXAM: LEFT FEMUR 2 VIEWS  COMPARISON:  None Available. FINDINGS: Acute comminuted inter trochanteric fractures of the left hip with mild varus angulation. Displaced lesser trochanteric fragment. Degenerative changes in the left hip without dislocation. Visualized left hemipelvis and the remainder of the femur appear intact. Postoperative left knee arthroplasty. Vascular calcifications. Postoperative and degenerative changes in the lower lumbar spine. IMPRESSION: Acute comminuted inter trochanteric fractures of the left proximal femur with varus angulation. Electronically Signed   By: Lucienne Capers M.D.   On: 02/09/2023 20:36   MR CERVICAL SPINE WO CONTRAST  Result Date: 01/21/2023 CLINICAL DATA:  Balance problems.  History of neck and back surgery. EXAM: MRI CERVICAL AND THORACIC SPINE WITHOUT CONTRAST TECHNIQUE: Multiplanar and multiecho pulse sequences of the cervical spine, to include the craniocervical junction and cervicothoracic junction, and the thoracic spine, were obtained without intravenous contrast. COMPARISON:  Cervical spine CT 06/11/2021 FINDINGS: MRI CERVICAL SPINE FINDINGS Alignment: Physiologic. Vertebrae: No fracture, evidence of discitis, or bone lesion. C3-4 ACDF with solid arthrodesis Cord: Cord impingement described below.  No cord signal abnormality. Posterior Fossa, vertebral arteries, paraspinal tissues: No perispinal mass or inflammation seen. Disc levels: C2-3: Mild facet spurring. Small central disc protrusion. Left foraminal narrowing is mild C3-4: ACDF.  Solid arthrodesis with no impingement C4-5: Disc narrowing and bulging with uncovertebral ridging. Facet spurring with ligamentum flavum buckling causing cord flattening. Active facet arthritis on the right at this level with joint effusion and marrow edema. Biforaminal impingement greater on the right C5-6: Degenerative facet spurring and uncovertebral ridging. Moderate left foraminal narrowing C6-7: Disc narrowing and bulging with uncovertebral  ridging. Moderate left foraminal narrowing and mild right foraminal stenosis C7-T1:This asymmetric leftward disc bulging and uncovertebral ridging. Moderate left foraminal stenosis. MRI THORACIC SPINE FINDINGS Alignment:  Exaggerated thoracic kyphosis.  Negative for listhesis. Vertebrae: No fracture, evidence of discitis, or bone lesion. Cord:  Normal cord signal and morphology. Paraspinal and other soft tissues: Negative for perispinal mass or inflammation. Disc levels: Bridging osteophytes ankylosis the intervertebral disc spaces at T5 to T11. Upper thoracic facet spurring and ligamentum flavum thickening but diffusely patent thecal sac and foramina. IMPRESSION: Cervical spine: 1. C4-5 degenerative spinal stenosis with posterior cord flattening. There is also biforaminal impingement with active facet arthritis on the right at this level. 2. C3-4 ACDF with solid arthrodesis. 3. Spurring causes moderate left foraminal narrowing at C5-6 to C7-T1. Thoracic spine: Diffuse spondylosis with multi-level bridging osteophytes. No neural impingement or visible inflammation. Electronically Signed   By: Jorje Guild M.D.   On: 01/21/2023 08:04   MR THORACIC SPINE WO CONTRAST  Result Date: 01/21/2023 CLINICAL DATA:  Balance problems.  History of neck and back surgery. EXAM:  MRI CERVICAL AND THORACIC SPINE WITHOUT CONTRAST TECHNIQUE: Multiplanar and multiecho pulse sequences of the cervical spine, to include the craniocervical junction and cervicothoracic junction, and the thoracic spine, were obtained without intravenous contrast. COMPARISON:  Cervical spine CT 06/11/2021 FINDINGS: MRI CERVICAL SPINE FINDINGS Alignment: Physiologic. Vertebrae: No fracture, evidence of discitis, or bone lesion. C3-4 ACDF with solid arthrodesis Cord: Cord impingement described below.  No cord signal abnormality. Posterior Fossa, vertebral arteries, paraspinal tissues: No perispinal mass or inflammation seen. Disc levels: C2-3: Mild facet  spurring. Small central disc protrusion. Left foraminal narrowing is mild C3-4: ACDF.  Solid arthrodesis with no impingement C4-5: Disc narrowing and bulging with uncovertebral ridging. Facet spurring with ligamentum flavum buckling causing cord flattening. Active facet arthritis on the right at this level with joint effusion and marrow edema. Biforaminal impingement greater on the right C5-6: Degenerative facet spurring and uncovertebral ridging. Moderate left foraminal narrowing C6-7: Disc narrowing and bulging with uncovertebral ridging. Moderate left foraminal narrowing and mild right foraminal stenosis C7-T1:This asymmetric leftward disc bulging and uncovertebral ridging. Moderate left foraminal stenosis. MRI THORACIC SPINE FINDINGS Alignment:  Exaggerated thoracic kyphosis.  Negative for listhesis. Vertebrae: No fracture, evidence of discitis, or bone lesion. Cord:  Normal cord signal and morphology. Paraspinal and other soft tissues: Negative for perispinal mass or inflammation. Disc levels: Bridging osteophytes ankylosis the intervertebral disc spaces at T5 to T11. Upper thoracic facet spurring and ligamentum flavum thickening but diffusely patent thecal sac and foramina. IMPRESSION: Cervical spine: 1. C4-5 degenerative spinal stenosis with posterior cord flattening. There is also biforaminal impingement with active facet arthritis on the right at this level. 2. C3-4 ACDF with solid arthrodesis. 3. Spurring causes moderate left foraminal narrowing at C5-6 to C7-T1. Thoracic spine: Diffuse spondylosis with multi-level bridging osteophytes. No neural impingement or visible inflammation. Electronically Signed   By: Jorje Guild M.D.   On: 01/21/2023 08:04     Subjective: - no chest pain, shortness of breath, no abdominal pain, nausea or vomiting.   Discharge Exam: BP (!) 141/76 (BP Location: Right Arm)   Pulse 97   Temp 99 F (37.2 C) (Oral)   Resp 18   Wt 97.5 kg   SpO2 93%   BMI 28.37 kg/m    General: Pt is alert, awake, not in acute distress Cardiovascular: RRR, S1/S2 +, no rubs, no gallops Respiratory: CTA bilaterally, no wheezing, no rhonchi Abdominal: Soft, NT, ND, bowel sounds + Extremities: no edema, no cyanosis  The results of significant diagnostics from this hospitalization (including imaging, microbiology, ancillary and laboratory) are listed below for reference.     Microbiology: Recent Results (from the past 240 hour(s))  Surgical pcr screen     Status: None   Collection Time: 02/10/23  2:34 PM   Specimen: Nasal Mucosa; Nasal Swab  Result Value Ref Range Status   MRSA, PCR NEGATIVE NEGATIVE Final   Staphylococcus aureus NEGATIVE NEGATIVE Final    Comment: (NOTE) The Xpert SA Assay (FDA approved for NASAL specimens in patients 4 years of age and older), is one component of a comprehensive surveillance program. It is not intended to diagnose infection nor to guide or monitor treatment. Performed at [facility], [place] [address] [place], [place] [address]      Labs: Basic Metabolic Panel: Recent Labs  Lab 02/09/23 2002 02/10/23 0426 02/10/23 1833 02/11/23 0545 02/13/23 0402  NA 141 141  --  137 139  K 3.6 3.8  --  4.4 3.9  CL 105  109  --  105 108  CO2 25 24  --  22 23  GLUCOSE 96 116*  --  171* 110*  BUN 27* 26*  --  23 29*  CREATININE 1.82* 1.59* 1.67* 1.74* 1.66*  CALCIUM 8.5* 8.0*  --  7.8* 8.3*  MG  --  1.6*  --   --  1.6*   Liver Function Tests: Recent Labs  Lab 02/10/23 0426 02/13/23 0402  AST 15 15  ALT 9 5  ALKPHOS 77 55  BILITOT 0.8 0.2*  PROT 6.1* 4.9*  ALBUMIN 3.0* 2.2*   CBC: Recent Labs  Lab 02/09/23 2002 02/10/23 0426 02/10/23 1833 02/11/23 0545 02/12/23 0544 02/13/23 0402  WBC 9.4 12.3* 13.7* 10.8* 11.8* 9.0  NEUTROABS 7.1 10.4*  --   --   --   --   HGB 12.4* 10.9* 11.8* 9.8* 9.0* 7.7*  HCT 39.1 34.1* 36.1* 29.9* 27.2* 24.4*  MCV 93.1 92.7 92.8 92.3 93.2 94.6  PLT 235 219 215 214 240 234    CBG: Recent Labs  Lab 02/12/23 2041  GLUCAP 109*   Hgb A1c No results for input(s): "HGBA1C" in the last 72 hours. Lipid Profile No results for input(s): "CHOL", "HDL", "LDLCALC", "TRIG", "CHOLHDL", "LDLDIRECT" in the last 72 hours. Thyroid function studies No results for input(s): "TSH", "T4TOTAL", "T3FREE", "THYROIDAB" in the last 72 hours.  Invalid input(s): "FREET3" Urinalysis    Component Value Date/Time   COLORURINE YELLOW 02/11/2020 [place] 02/11/2020 1753   LABSPEC 1.017 02/11/2020 1753   PHURINE 5.0 02/11/2020 1753   GLUCOSEU NEGATIVE 02/11/2020 1753   HGBUR MODERATE (A) 02/11/2020 1753   BILIRUBINUR NEGATIVE 02/11/2020 1753   KETONESUR NEGATIVE 02/11/2020 1753   PROTEINUR NEGATIVE 02/11/2020 1753   UROBILINOGEN 0.2 06/01/2013 1516   NITRITE NEGATIVE 02/11/2020 1753   LEUKOCYTESUR NEGATIVE 02/11/2020 1753    FURTHER DISCHARGE INSTRUCTIONS:   Get Medicines reviewed and adjusted: Please take all your medications with you for your next visit with your Primary MD   Laboratory/radiological data: Please request your Primary MD to go over all hospital tests and procedure/radiological results at the follow up, please ask your Primary MD to get all Hospital records sent to his/her office.   In some cases, they will be blood work, cultures and biopsy results pending at the time of your discharge. Please request that your primary care M.D. goes through all the records of your hospital data and follows up on these results.   Also Note the following: If you experience worsening of your admission symptoms, develop shortness of breath, life threatening emergency, suicidal or homicidal thoughts you must seek medical attention immediately by calling 911 or calling your MD immediately  if symptoms less severe.   You must read complete instructions/literature along with all the possible adverse reactions/side effects for all the Medicines you take and that have  been prescribed to you. Take any new Medicines after you have completely understood and accpet all the possible adverse reactions/side effects.    Do not drive when taking Pain medications or sleeping medications (Benzodaizepines)   Do not take more than prescribed Pain, Sleep and Anxiety Medications. It is not advisable to combine anxiety,sleep and pain medications without talking with your primary care practitioner   Special Instructions: If you have smoked or chewed Tobacco  in the last 2 yrs please stop smoking, stop any regular Alcohol  and or any Recreational drug use.   Wear Seat belts while driving.   Please  note: You were cared for by a hospitalist during your hospital stay. Once you are discharged, your primary care physician will handle any further medical issues. Please note that NO REFILLS for any discharge medications will be authorized once you are discharged, as it is imperative that you return to your primary care physician (or establish a relationship with a primary care physician if you do not have one) for your post hospital discharge needs so that they can reassess your need for medications and monitor your lab values.  Time coordinating discharge: 35 minutes  SIGNED:  Marzetta Board, MD, PhD 02/13/2023, 7:13 AM

## 2023-02-13 NOTE — TOC Transition Note (Signed)
Transition of Care [facility]) - CM/SW Discharge Note   Patient Details  Name: Zachary [name] MRN: [id number] Date of Birth: 06/23/1946  Transition of Care [facility]) CM/SW Contact:  Amador Cunas, [place] Phone Number: 02/13/2023, 12:13 PM   Clinical Narrative: Pt for dc to [facility] today. Spoke to Destiny in admissions who confirmed they are prepared to admit pt to room 154-1 today. Pt aware of dc and reports agreeable. RN provided with number for report and [facility] arranged for transport. SW signing off at dc.  Wandra Feinstein, MSW, LCSW [phone] (coverage)        Final next level of care: Skilled Nursing Facility Barriers to Discharge: No Barriers Identified   Patient Goals and CMS Choice CMS Medicare.gov Compare Post Acute Care list provided to:: Patient Choice offered to / list presented to : Patient  Discharge Placement                Patient chooses bed at: Other - please specify in the comment section below: [facility]) Patient to be transferred to facility by: [facility] Name of family member notified: Pt to update family Patient and family notified of of transfer: 02/13/23  Discharge Plan and Services Additional resources added to the After Visit Summary for     Discharge Planning Services: CM Consult Post Acute Care Choice: [place]                               Social Determinants of Health (SDOH) Interventions SDOH Screenings   Food Insecurity: No Food Insecurity (02/10/2023)  Housing: Low Risk  (02/10/2023)  Transportation Needs: No Transportation Needs (02/10/2023)  Utilities: Not At Risk (02/10/2023)  Depression (PHQ2-9): Low Risk  (03/02/2022)  Tobacco Use: Medium Risk (02/11/2023)     Readmission Risk Interventions    02/11/2023   12:02 PM  Readmission Risk Prevention Plan  Transportation Screening Complete  PCP or Specialist Appt within 5-7 Days Complete  Home Care Screening Complete  Medication Review (RN  CM) Complete

## 2023-02-14 DIAGNOSIS — E7849 Other hyperlipidemia: Secondary | ICD-10-CM | POA: Diagnosis not present

## 2023-02-14 DIAGNOSIS — I10 Essential (primary) hypertension: Secondary | ICD-10-CM | POA: Diagnosis not present

## 2023-02-14 DIAGNOSIS — D649 Anemia, unspecified: Secondary | ICD-10-CM | POA: Diagnosis not present

## 2023-02-14 DIAGNOSIS — Z96642 Presence of left artificial hip joint: Secondary | ICD-10-CM | POA: Diagnosis not present

## 2023-02-14 DIAGNOSIS — R531 Weakness: Secondary | ICD-10-CM | POA: Diagnosis not present

## 2023-02-23 ENCOUNTER — Encounter (HOSPITAL_COMMUNITY): Payer: Self-pay

## 2023-02-23 ENCOUNTER — Emergency Department (HOSPITAL_COMMUNITY): Payer: PPO

## 2023-02-23 ENCOUNTER — Other Ambulatory Visit: Payer: Self-pay

## 2023-02-23 ENCOUNTER — Emergency Department (HOSPITAL_COMMUNITY)
Admission: EM | Admit: 2023-02-23 | Discharge: 2023-02-23 | Disposition: A | Discharge status: Home / Self Care | Payer: PPO | Attending: Emergency Medicine | Admitting: Emergency Medicine

## 2023-02-23 DIAGNOSIS — R7989 Other specified abnormal findings of blood chemistry: Secondary | ICD-10-CM | POA: Diagnosis not present

## 2023-02-23 DIAGNOSIS — S72142D Displaced intertrochanteric fracture of left femur, subsequent encounter for closed fracture with routine healing: Secondary | ICD-10-CM | POA: Diagnosis not present

## 2023-02-23 DIAGNOSIS — D72829 Elevated white blood cell count, unspecified: Secondary | ICD-10-CM | POA: Insufficient documentation

## 2023-02-23 DIAGNOSIS — R55 Syncope and collapse: Secondary | ICD-10-CM | POA: Diagnosis not present

## 2023-02-23 DIAGNOSIS — N3001 Acute cystitis with hematuria: Secondary | ICD-10-CM

## 2023-02-23 DIAGNOSIS — Z7901 Long term (current) use of anticoagulants: Secondary | ICD-10-CM | POA: Insufficient documentation

## 2023-02-23 DIAGNOSIS — Z79899 Other long term (current) drug therapy: Secondary | ICD-10-CM | POA: Diagnosis not present

## 2023-02-23 DIAGNOSIS — I10 Essential (primary) hypertension: Secondary | ICD-10-CM | POA: Diagnosis not present

## 2023-02-23 DIAGNOSIS — M25512 Pain in left shoulder: Secondary | ICD-10-CM | POA: Diagnosis not present

## 2023-02-23 DIAGNOSIS — N179 Acute kidney failure, unspecified: Secondary | ICD-10-CM | POA: Diagnosis not present

## 2023-02-23 LAB — CBC WITH DIFFERENTIAL/PLATELET
Abs Immature Granulocytes: 0.17 K/uL — ABNORMAL HIGH (ref 0.00–0.07)
Basophils Absolute: 0.1 K/uL (ref 0.0–0.1)
Basophils Relative: 1 %
Eosinophils Absolute: 0.1 K/uL (ref 0.0–0.5)
Eosinophils Relative: 1 %
HCT: 37 % — ABNORMAL LOW (ref 39.0–52.0)
Hemoglobin: 11.2 g/dL — ABNORMAL LOW (ref 13.0–17.0)
Immature Granulocytes: 1 %
Lymphocytes Relative: 10 %
Lymphs Abs: 1.3 K/uL (ref 0.7–4.0)
MCH: 28.7 pg (ref 26.0–34.0)
MCHC: 30.3 g/dL (ref 30.0–36.0)
MCV: 94.9 fL (ref 80.0–100.0)
Monocytes Absolute: 0.5 K/uL (ref 0.1–1.0)
Monocytes Relative: 4 %
Neutro Abs: 11 K/uL — ABNORMAL HIGH (ref 1.7–7.7)
Neutrophils Relative %: 83 %
Platelets: 412 K/uL — ABNORMAL HIGH (ref 150–400)
RBC: 3.9 MIL/uL — ABNORMAL LOW (ref 4.22–5.81)
RDW: 13.4 % (ref 11.5–15.5)
WBC: 13.2 K/uL — ABNORMAL HIGH (ref 4.0–10.5)
nRBC: 0 % (ref 0.0–0.2)

## 2023-02-23 LAB — URINALYSIS, ROUTINE W REFLEX MICROSCOPIC
Bilirubin Urine: NEGATIVE
Glucose, UA: NEGATIVE mg/dL
Ketones, ur: NEGATIVE mg/dL
Nitrite: NEGATIVE
Protein, ur: 100 mg/dL — AB
RBC / HPF: >50 RBC/hpf (ref 0–5)
Specific Gravity, Urine: 1.014 (ref 1.005–1.030)
WBC, UA: >50 WBC/hpf (ref 0–5)
pH: 5 (ref 5.0–8.0)

## 2023-02-23 LAB — BASIC METABOLIC PANEL
Anion gap: 6 (ref 5–15)
BUN: 49 mg/dL — ABNORMAL HIGH (ref 8–23)
CO2: 24 mmol/L (ref 22–32)
Calcium: 8.8 mg/dL — ABNORMAL LOW (ref 8.9–10.3)
Chloride: 106 mmol/L (ref 98–111)
Creatinine, Ser: 2.12 mg/dL — ABNORMAL HIGH (ref 0.61–1.24)
GFR, Estimated: 32 mL/min — ABNORMAL LOW (ref >60)
Glucose, Bld: 114 mg/dL — ABNORMAL HIGH (ref 70–99)
Potassium: 5 mmol/L (ref 3.5–5.1)
Sodium: 136 mmol/L (ref 135–145)

## 2023-02-23 LAB — TROPONIN I (HIGH SENSITIVITY)
Troponin I (High Sensitivity): 3 ng/L (ref <18)
Troponin I (High Sensitivity): 3 ng/L (ref <18)

## 2023-02-23 LAB — CBG MONITORING, ED: Glucose-Capillary: 105 mg/dL — ABNORMAL HIGH (ref 70–99)

## 2023-02-23 MED ORDER — CEPHALEXIN 500 MG PO CAPS
500.0000 mg | ORAL_CAPSULE | Freq: Once | ORAL | Status: AC
  Administered 2023-02-23: 500 mg via ORAL
  Filled 2023-02-23: qty 1

## 2023-02-23 MED ORDER — CEPHALEXIN 500 MG PO CAPS: 500.0000 mg | ORAL_CAPSULE | Freq: Four times a day (QID) | ORAL | 0 refills | Status: AC

## 2023-02-23 MED ORDER — SODIUM CHLORIDE 0.9 % IV BOLUS
500.0000 mL | Freq: Once | INTRAVENOUS | Status: AC
  Administered 2023-02-23: 500 mL via INTRAVENOUS

## 2023-02-23 NOTE — ED Triage Notes (Addendum)
Patient BIB [facility] from orthopedic office. Left hip surgery 2 weeks ago. Syncopal episode in wheelchair before EMS arrived. BP 80/50 on scene. Another syncopal episode with EMS. Has been urinating a lot more recently. No chest or abdominal pain. Had a steroid shot at the orthopedic office.  CBG 126 22G left hand 483m fluid

## 2023-02-23 NOTE — Discharge Instructions (Addendum)
It was a pleasure taking care of you today!  Your CT scan did not show any emergent findings today.  Your labs show concern for dehydration, you are provided with fluids in the emergency department.  It is important that you ensure to maintain fluid intake with water, tea, broth, Pedialyte, Gatorade.  Your urine showed concern for UTI today.  You will be treated with Keflex, take as directed.  Call your primary care provider tomorrow to set up a follow-up appointment regarding today's ED visit.  Return to the emergency department if you experience increasing/worsening symptoms.

## 2023-02-23 NOTE — ED Provider Notes (Signed)
[facility] Provider Note   CSN: [id number] Arrival date & time: 02/23/23  1222     History  Chief Complaint  Patient presents with   Loss of Consciousness    Zachary [name] is a 77 y.o. male with a PMHx of HTN, pre-diabetes,  who presents to the ED with concerns for LOC onset yesterday. Notes that he had a syncopal episode yesterday. However, notes that he was at his ortho office today receiving a steroid injection when he had a syncopal episode. Notes that he felt lightheaded prior to the syncopal episode. Today was his first steroid injection. Denies previous PO steroids. Denies urinary symptoms, cough, rhinorrhea, nasal congestion, sneezing, chest pain, shortness of breath, nausea, vomiting, abdominal pain, dizziness, lightheadedness.   Per pt chart review: Pt has history of syncopal episodes where he was evaluated in the ED on 02/03/2021. At that time, patient had a negative workup and was informed to follow up with care team.   The history is provided by the patient. No language interpreter was used.       Home Medications Prior to Admission medications   Medication Sig Start Date End Date Taking? Authorizing Provider  cephALEXin (KEFLEX) 500 MG capsule Take 1 capsule (500 mg total) by mouth 4 (four) times daily for 7 days. 02/23/23 03/02/23 Yes Ishan Sanroman A, PA-C  acetaminophen (TYLENOL) 500 MG tablet Take 1,000 mg by mouth at bedtime as needed for moderate pain.    [provider]  albuterol (VENTOLIN HFA) 108 (90 Base) MCG/ACT inhaler Inhale 2 puffs into the lungs every 4 (four) hours as needed for wheezing or shortness of breath. 11/13/22   Volney American, PA-C  apixaban (ELIQUIS) 2.5 MG TABS tablet Take 1 tablet (2.5 mg total) by mouth 2 (two) times daily. 02/12/23 03/14/23  Corinne Ports, PA-C  carvedilol (COREG) 3.125 MG tablet Take 3.125 mg by mouth 2 (two) times daily.    [provider]   cholecalciferol (VITAMIN D3) 25 MCG (1000 UNIT) tablet Take 1 tablet (1,000 Units total) by mouth daily. 02/12/23 05/13/23  Corinne Ports, PA-C  dorzolamide-timolol (COSOPT) 2-0.5 % ophthalmic solution Place 1 drop into both eyes 2 (two) times daily. 01/02/23   [provider]  DULoxetine (CYMBALTA) 60 MG capsule TAKE 1 CAPSULE(60 MG) BY MOUTH DAILY 03/02/22   Cloria Spring, MD  gabapentin (NEURONTIN) 300 MG capsule Take 300 mg by mouth at bedtime.    [provider]  guaiFENesin (MUCINEX) 600 MG 12 hr tablet Take 1 tablet (600 mg total) by mouth 2 (two) times daily. 11/13/22   Volney American, PA-C  latanoprost (XALATAN) 0.005 % ophthalmic solution Place 1 drop into both eyes at bedtime.    [provider]  methocarbamol (ROBAXIN) 500 MG tablet Take 1 tablet (500 mg total) by mouth every 6 (six) hours as needed for muscle spasms. 02/12/23   Corinne Ports, PA-C  oxyCODONE (OXY IR/ROXICODONE) 5 MG immediate release tablet Take 1 tablet (5 mg total) by mouth every 4 (four) hours as needed for moderate pain or severe pain. 02/12/23   Corinne Ports, PA-C  pravastatin (PRAVACHOL) 10 MG tablet Take 10 mg by mouth at bedtime. 01/23/20   [provider]  promethazine-dextromethorphan (PROMETHAZINE-DM) 6.25-15 MG/5ML syrup Take 5 mLs by mouth 4 (four) times daily as needed. Patient not taking: Reported on 01/11/2023 11/13/22   Volney American, PA-C  sodium bicarbonate 650 MG tablet Take  650 mg by mouth daily.    [provider]  tamsulosin (FLOMAX) 0.4 MG CAPS capsule Take 0.4 mg by mouth daily.    [provider]  valsartan (DIOVAN) 40 MG tablet Take 40 mg by mouth daily.    [provider]      Allergies    Poison oak extract, Sulfonamide derivatives, and Sulfa antibiotics    Review of Systems   Review of Systems  All other systems reviewed and are negative.   Physical Exam Updated Vital Signs BP 129/68 (BP Location:  Left Arm)   Pulse 87   Temp 97.9 F (36.6 C) (Oral)   Resp 19   Ht '6\' 1"'$  (1.854 m)   Wt 97 kg   SpO2 98%   BMI 28.21 kg/m  Physical Exam Vitals and nursing note reviewed.  Constitutional:      General: He is not in acute distress.    Appearance: He is not diaphoretic.  HENT:     Head: Normocephalic and atraumatic.     Mouth/Throat:     Pharynx: No oropharyngeal exudate.  Eyes:     General: No scleral icterus.    Conjunctiva/sclera: Conjunctivae normal.  Cardiovascular:     Rate and Rhythm: Normal rate and regular rhythm.     Pulses: Normal pulses.     Heart sounds: Normal heart sounds.  Pulmonary:     Effort: Pulmonary effort is normal. No respiratory distress.     Breath sounds: Normal breath sounds. No wheezing.  Abdominal:     General: Bowel sounds are normal.     Palpations: Abdomen is soft. There is no mass.     Tenderness: There is no abdominal tenderness. There is no guarding or rebound.  Musculoskeletal:        General: Normal range of motion.     Cervical back: Normal range of motion and neck supple.  Skin:    General: Skin is warm and dry.  Neurological:     Mental Status: He is alert.  Psychiatric:        Behavior: Behavior normal.     ED Results / Procedures / Treatments   Labs (all labs ordered are listed, but only abnormal results are displayed) Labs Reviewed  BASIC METABOLIC PANEL - Abnormal; Notable for the following components:      Result Value   Glucose, Bld 114 (*)    BUN 49 (*)    Creatinine, Ser 2.12 (*)    Calcium 8.8 (*)    GFR, Estimated 32 (*)    All other components within normal limits  CBC WITH DIFFERENTIAL/PLATELET - Abnormal; Notable for the following components:   WBC 13.2 (*)    RBC 3.90 (*)    Hemoglobin 11.2 (*)    HCT 37.0 (*)    Platelets 412 (*)    Neutro Abs 11.0 (*)    Abs Immature Granulocytes 0.17 (*)    All other components within normal limits  URINALYSIS, ROUTINE W REFLEX MICROSCOPIC - Abnormal; Notable for  the following components:   APPearance HAZY (*)    Hgb urine dipstick MODERATE (*)    Protein, ur 100 (*)    Leukocytes,Ua LARGE (*)    Bacteria, UA RARE (*)    All other components within normal limits  CBG MONITORING, ED - Abnormal; Notable for the following components:   Glucose-Capillary 105 (*)    All other components within normal limits  URINE CULTURE  TROPONIN I (HIGH SENSITIVITY)  TROPONIN I (  HIGH SENSITIVITY)    EKG EKG Interpretation  Date/Time:  Tuesday February 23 2023 12:33:26 EST Ventricular Rate:  85 PR Interval:  183 QRS Duration: 151 QT Interval:  393 QTC Calculation: 468 R Axis:   35 Text Interpretation: Sinus rhythm Right bundle branch block No significant change since last tracing Abnormal ECG Confirmed by Carmin Muskrat [phone]) on 02/23/2023 12:37:47 PM  Radiology CT Head Wo Contrast  Result Date: 02/23/2023 CLINICAL DATA:  Syncope EXAM: CT HEAD WITHOUT CONTRAST TECHNIQUE: Contiguous axial images were obtained from the base of the skull through the vertex without intravenous contrast. RADIATION DOSE REDUCTION: This exam was performed according to the departmental dose-optimization program which includes automated exposure control, adjustment of the mA and/or kV according to patient size and/or use of iterative reconstruction technique. COMPARISON:  02/09/2023 FINDINGS: Brain: No evidence of acute infarction, hemorrhage, hydrocephalus, extra-axial collection or mass lesion/mass effect. Periventricular white matter hypodensity. Vascular: No hyperdense vessel or unexpected calcification. Skull: Normal. Negative for fracture or focal lesion. Sinuses/Orbits: No acute finding. Other: None. IMPRESSION: No acute intracranial pathology. Small-vessel white matter disease. Electronically Signed   By: Delanna Ahmadi M.D.   On: 02/23/2023 14:13    Procedures Procedures    Medications Ordered in ED Medications  sodium chloride 0.9 % bolus 500 mL (0 mLs Intravenous Stopped  02/23/23 1822)  cephALEXin (KEFLEX) capsule 500 mg (500 mg Oral Given 02/23/23 1836)    ED Course/ Medical Decision Making/ A&P Clinical Course as of 02/23/23 1909  Tue Feb 23, 2023  1304 Notified by RT that patient refused CXR. [SB]  1339 Discussion held with patient and wife at bedside along with attending on recommendations for CT head, pt agreeable to CT head at this time.  [SB]  1713 Discussed with patient as well as wife regarding lab and imaging findings at bedside.  Again patient notes that he does not want to be admitted at this time, patient was evaluated by attending and noted at that time that he does not want to be admitted.  Discussed with patient importance of maintaining follow-up with her primary care provider closely this week regarding today's ED visit.  Ensure to maintain fluid intake as well.  Answered all available questions.  Patient appears safe for discharge at this time. [SB]    Clinical Course User Index [SB] Jnaya Butrick A, PA-C                              Medical Decision Making Amount and/or Complexity of Data Reviewed Labs: ordered. Radiology: ordered.  Risk Prescription drug management.   Pt presents with syncopal episode onset yesterday. Pt asymptomatic at this time. Vital signs, pt afebrile, stable blood pressure at 129/68. On exam, pt with no acute cardiovascular, respiratory, abdominal exam findings. Differential diagnosis includes arrhythmia, electrolyte abnormality, hypoglycemia, ACS, intracranial abnormality.    Labs:  I ordered, and personally interpreted labs.  The pertinent results include:   CBC with leukocytosis at 13.2, hgb at 11.2, improved from previous findings Initial troponin 3 delta troponin 3 BMP with elevated creatinine at 2.12, BUN elevated at 49, otherwise unremarkable CBG at 105 Urinalysis notable for leukocytes and large amount of hemoglobin otherwise nitrate negative.  Urine culture sent  Imaging: I ordered imaging  studies including CT head without I independently visualized and interpreted imaging which showed: No acute findings I agree with the radiologist interpretation  Medications:  I ordered medication including IV fluids and  Keflex for antibiotic treatment Reevaluation of the patient after these medicines and interventions, I reevaluated the patient and found that they have improved I have reviewed the patients home medicines and have made adjustments as needed   Disposition: Presentation suspicious for syncope and AKI.  Doubt concerns at this time for intracranial abnormality, arrhythmia, electrolyte abnormality, hypoglycemia.  Patient also found to have acute cystitis at this time.  Conversation held with patient who notes that they would rather go back to the rehab facility then be admitted at this time.  Case discussed with attending who evaluated patient at bedside. After consideration of the diagnostic results and the patients response to treatment, I feel that the patient would benefit from Discharge home.  Patient sent with a prescription for Keflex.  Also given strict return precautions to return to the ED if symptoms are worsening.  Supportive care measures and strict return precautions discussed with patient at bedside. Pt acknowledges and verbalizes understanding. Pt appears safe for discharge. Follow up as indicated in discharge paperwork.    This chart was dictated using voice recognition software, Dragon. Despite the best efforts of this provider to proofread and correct errors, errors may still occur which can change documentation meaning.   Final Clinical Impression(s) / ED Diagnoses Final diagnoses:  Syncope and collapse  AKI (acute kidney injury) ([place])  Acute cystitis with hematuria    Rx / DC Orders ED Discharge Orders          Ordered    cephALEXin (KEFLEX) 500 MG capsule  4 times daily        02/23/23 1836              Jakorian Marengo A, PA-C 02/23/23 1909     Carmin Muskrat, MD 02/24/23 [phone]

## 2023-02-23 NOTE — ED Notes (Signed)
Called [facility] to request transport

## 2023-02-25 ENCOUNTER — Encounter: Payer: Self-pay | Admitting: Radiology

## 2023-02-25 LAB — URINE CULTURE

## 2023-03-02 ENCOUNTER — Other Ambulatory Visit: Payer: Self-pay | Admitting: Student

## 2023-03-02 DIAGNOSIS — S72142D Displaced intertrochanteric fracture of left femur, subsequent encounter for closed fracture with routine healing: Secondary | ICD-10-CM

## 2023-03-03 ENCOUNTER — Telehealth: Payer: Self-pay

## 2023-03-03 NOTE — Telephone Encounter (Signed)
     Patient  visit on 2/27  at [facility] you been able to follow up with your primary care physician? Yes  Rehab  The patient was or was not able to obtain any needed medicine or equipment. Yes   Are there diet recommendations that you are having difficulty following? Na   Patient expresses understanding of discharge instructions and education provided has no other needs at this time. [place], [place] [phone] [address], [place], [place] [address] Phone: [phone] Email: [email or web address]

## 2023-03-04 ENCOUNTER — Other Ambulatory Visit (HOSPITAL_COMMUNITY): Payer: Self-pay | Admitting: Psychiatry

## 2023-03-04 NOTE — Telephone Encounter (Signed)
Pt needs appt, not seen in one year

## 2023-03-05 ENCOUNTER — Ambulatory Visit
Admission: RE | Admit: 2023-03-05 | Discharge: 2023-03-05 | Disposition: A | Discharge status: Home / Self Care | Payer: PPO | Source: Ambulatory Visit | Attending: Student | Admitting: Student

## 2023-03-05 DIAGNOSIS — S72142D Displaced intertrochanteric fracture of left femur, subsequent encounter for closed fracture with routine healing: Secondary | ICD-10-CM

## 2023-03-05 DIAGNOSIS — R29898 Other symptoms and signs involving the musculoskeletal system: Secondary | ICD-10-CM | POA: Diagnosis not present

## 2023-03-09 DIAGNOSIS — Z8551 Personal history of malignant neoplasm of bladder: Secondary | ICD-10-CM | POA: Diagnosis not present

## 2023-03-09 DIAGNOSIS — Z87891 Personal history of nicotine dependence: Secondary | ICD-10-CM | POA: Diagnosis not present

## 2023-03-09 DIAGNOSIS — R41841 Cognitive communication deficit: Secondary | ICD-10-CM | POA: Diagnosis not present

## 2023-03-09 DIAGNOSIS — I10 Essential (primary) hypertension: Secondary | ICD-10-CM | POA: Diagnosis not present

## 2023-03-09 DIAGNOSIS — G4733 Obstructive sleep apnea (adult) (pediatric): Secondary | ICD-10-CM | POA: Diagnosis not present

## 2023-03-09 DIAGNOSIS — Z556 Problems related to health literacy: Secondary | ICD-10-CM | POA: Diagnosis not present

## 2023-03-09 DIAGNOSIS — E782 Mixed hyperlipidemia: Secondary | ICD-10-CM | POA: Diagnosis not present

## 2023-03-09 DIAGNOSIS — D62 Acute posthemorrhagic anemia: Secondary | ICD-10-CM | POA: Diagnosis not present

## 2023-03-09 DIAGNOSIS — F419 Anxiety disorder, unspecified: Secondary | ICD-10-CM | POA: Diagnosis not present

## 2023-03-09 DIAGNOSIS — R300 Dysuria: Secondary | ICD-10-CM | POA: Diagnosis not present

## 2023-03-09 DIAGNOSIS — S72142D Displaced intertrochanteric fracture of left femur, subsequent encounter for closed fracture with routine healing: Secondary | ICD-10-CM | POA: Diagnosis not present

## 2023-03-09 DIAGNOSIS — F32A Depression, unspecified: Secondary | ICD-10-CM | POA: Diagnosis not present

## 2023-03-09 DIAGNOSIS — Z96642 Presence of left artificial hip joint: Secondary | ICD-10-CM | POA: Diagnosis not present

## 2023-03-09 DIAGNOSIS — M1990 Unspecified osteoarthritis, unspecified site: Secondary | ICD-10-CM | POA: Diagnosis not present

## 2023-03-09 DIAGNOSIS — I4891 Unspecified atrial fibrillation: Secondary | ICD-10-CM | POA: Diagnosis not present

## 2023-03-09 DIAGNOSIS — H409 Unspecified glaucoma: Secondary | ICD-10-CM | POA: Diagnosis not present

## 2023-03-09 DIAGNOSIS — G8929 Other chronic pain: Secondary | ICD-10-CM | POA: Diagnosis not present

## 2023-03-09 DIAGNOSIS — Z96652 Presence of left artificial knee joint: Secondary | ICD-10-CM | POA: Diagnosis not present

## 2023-03-09 DIAGNOSIS — Z9181 History of falling: Secondary | ICD-10-CM | POA: Diagnosis not present

## 2023-03-17 DIAGNOSIS — E1169 Type 2 diabetes mellitus with other specified complication: Secondary | ICD-10-CM | POA: Diagnosis not present

## 2023-03-17 DIAGNOSIS — E782 Mixed hyperlipidemia: Secondary | ICD-10-CM | POA: Diagnosis not present

## 2023-03-17 DIAGNOSIS — S72142D Displaced intertrochanteric fracture of left femur, subsequent encounter for closed fracture with routine healing: Secondary | ICD-10-CM | POA: Diagnosis not present

## 2023-03-17 DIAGNOSIS — I129 Hypertensive chronic kidney disease with stage 1 through stage 4 chronic kidney disease, or unspecified chronic kidney disease: Secondary | ICD-10-CM | POA: Diagnosis not present

## 2023-03-17 DIAGNOSIS — E1122 Type 2 diabetes mellitus with diabetic chronic kidney disease: Secondary | ICD-10-CM | POA: Diagnosis not present

## 2023-03-17 DIAGNOSIS — R55 Syncope and collapse: Secondary | ICD-10-CM | POA: Diagnosis not present

## 2023-03-17 DIAGNOSIS — D631 Anemia in chronic kidney disease: Secondary | ICD-10-CM | POA: Diagnosis not present

## 2023-03-17 DIAGNOSIS — D649 Anemia, unspecified: Secondary | ICD-10-CM | POA: Diagnosis not present

## 2023-03-17 DIAGNOSIS — R809 Proteinuria, unspecified: Secondary | ICD-10-CM | POA: Diagnosis not present

## 2023-03-17 DIAGNOSIS — N1832 Chronic kidney disease, stage 3b: Secondary | ICD-10-CM | POA: Diagnosis not present

## 2023-03-17 DIAGNOSIS — M1990 Unspecified osteoarthritis, unspecified site: Secondary | ICD-10-CM | POA: Diagnosis not present

## 2023-03-17 DIAGNOSIS — M4802 Spinal stenosis, cervical region: Secondary | ICD-10-CM | POA: Diagnosis not present

## 2023-03-23 DIAGNOSIS — S72142D Displaced intertrochanteric fracture of left femur, subsequent encounter for closed fracture with routine healing: Secondary | ICD-10-CM | POA: Diagnosis not present

## 2023-04-01 DIAGNOSIS — M25652 Stiffness of left hip, not elsewhere classified: Secondary | ICD-10-CM | POA: Diagnosis not present

## 2023-04-01 DIAGNOSIS — M62552 Muscle wasting and atrophy, not elsewhere classified, left thigh: Secondary | ICD-10-CM | POA: Diagnosis not present

## 2023-04-01 DIAGNOSIS — M25552 Pain in left hip: Secondary | ICD-10-CM | POA: Diagnosis not present

## 2023-04-01 DIAGNOSIS — R262 Difficulty in walking, not elsewhere classified: Secondary | ICD-10-CM | POA: Diagnosis not present

## 2023-04-08 DIAGNOSIS — M25552 Pain in left hip: Secondary | ICD-10-CM | POA: Diagnosis not present

## 2023-04-08 DIAGNOSIS — M25652 Stiffness of left hip, not elsewhere classified: Secondary | ICD-10-CM | POA: Diagnosis not present

## 2023-04-08 DIAGNOSIS — R262 Difficulty in walking, not elsewhere classified: Secondary | ICD-10-CM | POA: Diagnosis not present

## 2023-04-08 DIAGNOSIS — M62552 Muscle wasting and atrophy, not elsewhere classified, left thigh: Secondary | ICD-10-CM | POA: Diagnosis not present

## 2023-04-14 DIAGNOSIS — M62552 Muscle wasting and atrophy, not elsewhere classified, left thigh: Secondary | ICD-10-CM | POA: Diagnosis not present

## 2023-04-14 DIAGNOSIS — M25552 Pain in left hip: Secondary | ICD-10-CM | POA: Diagnosis not present

## 2023-04-14 DIAGNOSIS — R262 Difficulty in walking, not elsewhere classified: Secondary | ICD-10-CM | POA: Diagnosis not present

## 2023-04-14 DIAGNOSIS — M25652 Stiffness of left hip, not elsewhere classified: Secondary | ICD-10-CM | POA: Diagnosis not present

## 2023-04-16 DIAGNOSIS — M25652 Stiffness of left hip, not elsewhere classified: Secondary | ICD-10-CM | POA: Diagnosis not present

## 2023-04-16 DIAGNOSIS — M25552 Pain in left hip: Secondary | ICD-10-CM | POA: Diagnosis not present

## 2023-04-16 DIAGNOSIS — M62552 Muscle wasting and atrophy, not elsewhere classified, left thigh: Secondary | ICD-10-CM | POA: Diagnosis not present

## 2023-04-16 DIAGNOSIS — R262 Difficulty in walking, not elsewhere classified: Secondary | ICD-10-CM | POA: Diagnosis not present

## 2023-04-19 DIAGNOSIS — R262 Difficulty in walking, not elsewhere classified: Secondary | ICD-10-CM | POA: Diagnosis not present

## 2023-04-19 DIAGNOSIS — M25652 Stiffness of left hip, not elsewhere classified: Secondary | ICD-10-CM | POA: Diagnosis not present

## 2023-04-19 DIAGNOSIS — M25552 Pain in left hip: Secondary | ICD-10-CM | POA: Diagnosis not present

## 2023-04-19 DIAGNOSIS — M62552 Muscle wasting and atrophy, not elsewhere classified, left thigh: Secondary | ICD-10-CM | POA: Diagnosis not present

## 2023-04-23 DIAGNOSIS — M25552 Pain in left hip: Secondary | ICD-10-CM | POA: Diagnosis not present

## 2023-04-23 DIAGNOSIS — M25652 Stiffness of left hip, not elsewhere classified: Secondary | ICD-10-CM | POA: Diagnosis not present

## 2023-04-23 DIAGNOSIS — M62552 Muscle wasting and atrophy, not elsewhere classified, left thigh: Secondary | ICD-10-CM | POA: Diagnosis not present

## 2023-04-23 DIAGNOSIS — R262 Difficulty in walking, not elsewhere classified: Secondary | ICD-10-CM | POA: Diagnosis not present

## 2023-04-27 DIAGNOSIS — N3000 Acute cystitis without hematuria: Secondary | ICD-10-CM | POA: Diagnosis not present

## 2023-04-27 DIAGNOSIS — N312 Flaccid neuropathic bladder, not elsewhere classified: Secondary | ICD-10-CM | POA: Diagnosis not present

## 2023-05-04 DIAGNOSIS — S72142D Displaced intertrochanteric fracture of left femur, subsequent encounter for closed fracture with routine healing: Secondary | ICD-10-CM | POA: Diagnosis not present

## 2023-05-05 DIAGNOSIS — M25652 Stiffness of left hip, not elsewhere classified: Secondary | ICD-10-CM | POA: Diagnosis not present

## 2023-05-05 DIAGNOSIS — M62552 Muscle wasting and atrophy, not elsewhere classified, left thigh: Secondary | ICD-10-CM | POA: Diagnosis not present

## 2023-05-05 DIAGNOSIS — M25552 Pain in left hip: Secondary | ICD-10-CM | POA: Diagnosis not present

## 2023-05-05 DIAGNOSIS — R262 Difficulty in walking, not elsewhere classified: Secondary | ICD-10-CM | POA: Diagnosis not present

## 2023-05-06 DIAGNOSIS — G4730 Sleep apnea, unspecified: Secondary | ICD-10-CM | POA: Diagnosis not present

## 2023-05-07 DIAGNOSIS — R339 Retention of urine, unspecified: Secondary | ICD-10-CM | POA: Diagnosis not present

## 2023-05-07 DIAGNOSIS — C678 Malignant neoplasm of overlapping sites of bladder: Secondary | ICD-10-CM | POA: Diagnosis not present

## 2023-05-07 DIAGNOSIS — N312 Flaccid neuropathic bladder, not elsewhere classified: Secondary | ICD-10-CM | POA: Diagnosis not present

## 2023-05-10 DIAGNOSIS — R262 Difficulty in walking, not elsewhere classified: Secondary | ICD-10-CM | POA: Diagnosis not present

## 2023-05-10 DIAGNOSIS — E663 Overweight: Secondary | ICD-10-CM | POA: Diagnosis not present

## 2023-05-10 DIAGNOSIS — M62552 Muscle wasting and atrophy, not elsewhere classified, left thigh: Secondary | ICD-10-CM | POA: Diagnosis not present

## 2023-05-10 DIAGNOSIS — M25652 Stiffness of left hip, not elsewhere classified: Secondary | ICD-10-CM | POA: Diagnosis not present

## 2023-05-10 DIAGNOSIS — M25552 Pain in left hip: Secondary | ICD-10-CM | POA: Diagnosis not present

## 2023-05-10 DIAGNOSIS — N3000 Acute cystitis without hematuria: Secondary | ICD-10-CM | POA: Diagnosis not present

## 2023-05-10 DIAGNOSIS — Z6829 Body mass index (BMI) 29.0-29.9, adult: Secondary | ICD-10-CM | POA: Diagnosis not present

## 2023-05-10 DIAGNOSIS — N390 Urinary tract infection, site not specified: Secondary | ICD-10-CM | POA: Diagnosis not present

## 2023-05-12 DIAGNOSIS — M62552 Muscle wasting and atrophy, not elsewhere classified, left thigh: Secondary | ICD-10-CM | POA: Diagnosis not present

## 2023-05-12 DIAGNOSIS — M25652 Stiffness of left hip, not elsewhere classified: Secondary | ICD-10-CM | POA: Diagnosis not present

## 2023-05-12 DIAGNOSIS — M25552 Pain in left hip: Secondary | ICD-10-CM | POA: Diagnosis not present

## 2023-05-12 DIAGNOSIS — R262 Difficulty in walking, not elsewhere classified: Secondary | ICD-10-CM | POA: Diagnosis not present

## 2023-05-13 DIAGNOSIS — E782 Mixed hyperlipidemia: Secondary | ICD-10-CM | POA: Diagnosis not present

## 2023-05-13 DIAGNOSIS — E118 Type 2 diabetes mellitus with unspecified complications: Secondary | ICD-10-CM | POA: Diagnosis not present

## 2023-05-13 DIAGNOSIS — R809 Proteinuria, unspecified: Secondary | ICD-10-CM | POA: Diagnosis not present

## 2023-05-13 DIAGNOSIS — E559 Vitamin D deficiency, unspecified: Secondary | ICD-10-CM | POA: Diagnosis not present

## 2023-05-17 DIAGNOSIS — M62552 Muscle wasting and atrophy, not elsewhere classified, left thigh: Secondary | ICD-10-CM | POA: Diagnosis not present

## 2023-05-17 DIAGNOSIS — M25652 Stiffness of left hip, not elsewhere classified: Secondary | ICD-10-CM | POA: Diagnosis not present

## 2023-05-17 DIAGNOSIS — R262 Difficulty in walking, not elsewhere classified: Secondary | ICD-10-CM | POA: Diagnosis not present

## 2023-05-17 DIAGNOSIS — M25552 Pain in left hip: Secondary | ICD-10-CM | POA: Diagnosis not present

## 2023-05-19 DIAGNOSIS — M4802 Spinal stenosis, cervical region: Secondary | ICD-10-CM | POA: Diagnosis not present

## 2023-05-19 DIAGNOSIS — R809 Proteinuria, unspecified: Secondary | ICD-10-CM | POA: Diagnosis not present

## 2023-05-19 DIAGNOSIS — S72142D Displaced intertrochanteric fracture of left femur, subsequent encounter for closed fracture with routine healing: Secondary | ICD-10-CM | POA: Diagnosis not present

## 2023-05-19 DIAGNOSIS — E782 Mixed hyperlipidemia: Secondary | ICD-10-CM | POA: Diagnosis not present

## 2023-05-19 DIAGNOSIS — D649 Anemia, unspecified: Secondary | ICD-10-CM | POA: Diagnosis not present

## 2023-05-19 DIAGNOSIS — N1832 Chronic kidney disease, stage 3b: Secondary | ICD-10-CM | POA: Diagnosis not present

## 2023-05-19 DIAGNOSIS — E1122 Type 2 diabetes mellitus with diabetic chronic kidney disease: Secondary | ICD-10-CM | POA: Diagnosis not present

## 2023-05-19 DIAGNOSIS — Z8551 Personal history of malignant neoplasm of bladder: Secondary | ICD-10-CM | POA: Diagnosis not present

## 2023-05-19 DIAGNOSIS — R55 Syncope and collapse: Secondary | ICD-10-CM | POA: Diagnosis not present

## 2023-05-19 DIAGNOSIS — M1990 Unspecified osteoarthritis, unspecified site: Secondary | ICD-10-CM | POA: Diagnosis not present

## 2023-05-19 DIAGNOSIS — I129 Hypertensive chronic kidney disease with stage 1 through stage 4 chronic kidney disease, or unspecified chronic kidney disease: Secondary | ICD-10-CM | POA: Diagnosis not present

## 2023-05-19 DIAGNOSIS — Z87442 Personal history of urinary calculi: Secondary | ICD-10-CM | POA: Diagnosis not present

## 2023-05-21 DIAGNOSIS — H401133 Primary open-angle glaucoma, bilateral, severe stage: Secondary | ICD-10-CM | POA: Diagnosis not present

## 2023-05-21 DIAGNOSIS — H3581 Retinal edema: Secondary | ICD-10-CM | POA: Diagnosis not present

## 2023-05-25 ENCOUNTER — Encounter (INDEPENDENT_AMBULATORY_CARE_PROVIDER_SITE_OTHER): Payer: Self-pay | Admitting: *Deleted

## 2023-05-26 DIAGNOSIS — R262 Difficulty in walking, not elsewhere classified: Secondary | ICD-10-CM | POA: Diagnosis not present

## 2023-05-26 DIAGNOSIS — M25652 Stiffness of left hip, not elsewhere classified: Secondary | ICD-10-CM | POA: Diagnosis not present

## 2023-05-26 DIAGNOSIS — M62552 Muscle wasting and atrophy, not elsewhere classified, left thigh: Secondary | ICD-10-CM | POA: Diagnosis not present

## 2023-05-26 DIAGNOSIS — M25552 Pain in left hip: Secondary | ICD-10-CM | POA: Diagnosis not present

## 2023-05-26 DIAGNOSIS — H31091 Other chorioretinal scars, right eye: Secondary | ICD-10-CM | POA: Diagnosis not present

## 2023-05-26 DIAGNOSIS — E113312 Type 2 diabetes mellitus with moderate nonproliferative diabetic retinopathy with macular edema, left eye: Secondary | ICD-10-CM | POA: Diagnosis not present

## 2023-05-27 DIAGNOSIS — R760 Raised antibody titer: Secondary | ICD-10-CM | POA: Diagnosis not present

## 2023-05-27 DIAGNOSIS — R809 Proteinuria, unspecified: Secondary | ICD-10-CM | POA: Diagnosis not present

## 2023-05-27 DIAGNOSIS — I129 Hypertensive chronic kidney disease with stage 1 through stage 4 chronic kidney disease, or unspecified chronic kidney disease: Secondary | ICD-10-CM | POA: Diagnosis not present

## 2023-05-27 DIAGNOSIS — N132 Hydronephrosis with renal and ureteral calculous obstruction: Secondary | ICD-10-CM | POA: Diagnosis not present

## 2023-05-27 DIAGNOSIS — N200 Calculus of kidney: Secondary | ICD-10-CM | POA: Diagnosis not present

## 2023-05-27 DIAGNOSIS — I5032 Chronic diastolic (congestive) heart failure: Secondary | ICD-10-CM | POA: Diagnosis not present

## 2023-05-27 DIAGNOSIS — E790 Hyperuricemia without signs of inflammatory arthritis and tophaceous disease: Secondary | ICD-10-CM | POA: Diagnosis not present

## 2023-05-27 DIAGNOSIS — N170 Acute kidney failure with tubular necrosis: Secondary | ICD-10-CM | POA: Diagnosis not present

## 2023-05-27 DIAGNOSIS — N1832 Chronic kidney disease, stage 3b: Secondary | ICD-10-CM | POA: Diagnosis not present

## 2023-05-27 DIAGNOSIS — R7303 Prediabetes: Secondary | ICD-10-CM | POA: Diagnosis not present

## 2023-05-27 DIAGNOSIS — D638 Anemia in other chronic diseases classified elsewhere: Secondary | ICD-10-CM | POA: Diagnosis not present

## 2023-05-28 DIAGNOSIS — R339 Retention of urine, unspecified: Secondary | ICD-10-CM | POA: Diagnosis not present

## 2023-05-28 DIAGNOSIS — R262 Difficulty in walking, not elsewhere classified: Secondary | ICD-10-CM | POA: Diagnosis not present

## 2023-05-28 DIAGNOSIS — M25552 Pain in left hip: Secondary | ICD-10-CM | POA: Diagnosis not present

## 2023-05-28 DIAGNOSIS — M25652 Stiffness of left hip, not elsewhere classified: Secondary | ICD-10-CM | POA: Diagnosis not present

## 2023-05-28 DIAGNOSIS — M62552 Muscle wasting and atrophy, not elsewhere classified, left thigh: Secondary | ICD-10-CM | POA: Diagnosis not present

## 2023-06-03 DIAGNOSIS — I129 Hypertensive chronic kidney disease with stage 1 through stage 4 chronic kidney disease, or unspecified chronic kidney disease: Secondary | ICD-10-CM | POA: Diagnosis not present

## 2023-06-03 DIAGNOSIS — R809 Proteinuria, unspecified: Secondary | ICD-10-CM | POA: Diagnosis not present

## 2023-06-03 DIAGNOSIS — N200 Calculus of kidney: Secondary | ICD-10-CM | POA: Diagnosis not present

## 2023-06-03 DIAGNOSIS — N1831 Chronic kidney disease, stage 3a: Secondary | ICD-10-CM | POA: Diagnosis not present

## 2023-06-07 DIAGNOSIS — M25652 Stiffness of left hip, not elsewhere classified: Secondary | ICD-10-CM | POA: Diagnosis not present

## 2023-06-07 DIAGNOSIS — M25552 Pain in left hip: Secondary | ICD-10-CM | POA: Diagnosis not present

## 2023-06-07 DIAGNOSIS — R262 Difficulty in walking, not elsewhere classified: Secondary | ICD-10-CM | POA: Diagnosis not present

## 2023-06-07 DIAGNOSIS — M62552 Muscle wasting and atrophy, not elsewhere classified, left thigh: Secondary | ICD-10-CM | POA: Diagnosis not present

## 2023-06-09 ENCOUNTER — Encounter (INDEPENDENT_AMBULATORY_CARE_PROVIDER_SITE_OTHER): Payer: Self-pay | Admitting: Gastroenterology

## 2023-06-09 DIAGNOSIS — E113312 Type 2 diabetes mellitus with moderate nonproliferative diabetic retinopathy with macular edema, left eye: Secondary | ICD-10-CM | POA: Diagnosis not present

## 2023-06-10 DIAGNOSIS — M25552 Pain in left hip: Secondary | ICD-10-CM | POA: Diagnosis not present

## 2023-06-10 DIAGNOSIS — M25652 Stiffness of left hip, not elsewhere classified: Secondary | ICD-10-CM | POA: Diagnosis not present

## 2023-06-10 DIAGNOSIS — R262 Difficulty in walking, not elsewhere classified: Secondary | ICD-10-CM | POA: Diagnosis not present

## 2023-06-10 DIAGNOSIS — M62552 Muscle wasting and atrophy, not elsewhere classified, left thigh: Secondary | ICD-10-CM | POA: Diagnosis not present

## 2023-06-15 DIAGNOSIS — M25652 Stiffness of left hip, not elsewhere classified: Secondary | ICD-10-CM | POA: Diagnosis not present

## 2023-06-15 DIAGNOSIS — M62552 Muscle wasting and atrophy, not elsewhere classified, left thigh: Secondary | ICD-10-CM | POA: Diagnosis not present

## 2023-06-15 DIAGNOSIS — M25552 Pain in left hip: Secondary | ICD-10-CM | POA: Diagnosis not present

## 2023-06-15 DIAGNOSIS — R262 Difficulty in walking, not elsewhere classified: Secondary | ICD-10-CM | POA: Diagnosis not present

## 2023-06-17 DIAGNOSIS — R339 Retention of urine, unspecified: Secondary | ICD-10-CM | POA: Diagnosis not present

## 2023-06-18 DIAGNOSIS — M62552 Muscle wasting and atrophy, not elsewhere classified, left thigh: Secondary | ICD-10-CM | POA: Diagnosis not present

## 2023-06-18 DIAGNOSIS — M25552 Pain in left hip: Secondary | ICD-10-CM | POA: Diagnosis not present

## 2023-06-18 DIAGNOSIS — R262 Difficulty in walking, not elsewhere classified: Secondary | ICD-10-CM | POA: Diagnosis not present

## 2023-06-18 DIAGNOSIS — M25652 Stiffness of left hip, not elsewhere classified: Secondary | ICD-10-CM | POA: Diagnosis not present

## 2023-06-19 DIAGNOSIS — E663 Overweight: Secondary | ICD-10-CM | POA: Diagnosis not present

## 2023-06-19 DIAGNOSIS — R319 Hematuria, unspecified: Secondary | ICD-10-CM | POA: Diagnosis not present

## 2023-06-19 DIAGNOSIS — N390 Urinary tract infection, site not specified: Secondary | ICD-10-CM | POA: Diagnosis not present

## 2023-06-19 DIAGNOSIS — Z6829 Body mass index (BMI) 29.0-29.9, adult: Secondary | ICD-10-CM | POA: Diagnosis not present

## 2023-06-21 DIAGNOSIS — Z6829 Body mass index (BMI) 29.0-29.9, adult: Secondary | ICD-10-CM | POA: Diagnosis not present

## 2023-06-21 DIAGNOSIS — Z713 Dietary counseling and surveillance: Secondary | ICD-10-CM | POA: Diagnosis not present

## 2023-06-21 DIAGNOSIS — G4733 Obstructive sleep apnea (adult) (pediatric): Secondary | ICD-10-CM | POA: Diagnosis not present

## 2023-06-21 DIAGNOSIS — N390 Urinary tract infection, site not specified: Secondary | ICD-10-CM | POA: Diagnosis not present

## 2023-06-22 DIAGNOSIS — S72142D Displaced intertrochanteric fracture of left femur, subsequent encounter for closed fracture with routine healing: Secondary | ICD-10-CM | POA: Diagnosis not present

## 2023-06-22 DIAGNOSIS — R339 Retention of urine, unspecified: Secondary | ICD-10-CM | POA: Diagnosis not present

## 2023-06-24 DIAGNOSIS — M62552 Muscle wasting and atrophy, not elsewhere classified, left thigh: Secondary | ICD-10-CM | POA: Diagnosis not present

## 2023-06-24 DIAGNOSIS — R262 Difficulty in walking, not elsewhere classified: Secondary | ICD-10-CM | POA: Diagnosis not present

## 2023-06-24 DIAGNOSIS — M25652 Stiffness of left hip, not elsewhere classified: Secondary | ICD-10-CM | POA: Diagnosis not present

## 2023-06-24 DIAGNOSIS — M25552 Pain in left hip: Secondary | ICD-10-CM | POA: Diagnosis not present

## 2023-06-25 DIAGNOSIS — M25652 Stiffness of left hip, not elsewhere classified: Secondary | ICD-10-CM | POA: Diagnosis not present

## 2023-06-25 DIAGNOSIS — R262 Difficulty in walking, not elsewhere classified: Secondary | ICD-10-CM | POA: Diagnosis not present

## 2023-06-25 DIAGNOSIS — M62552 Muscle wasting and atrophy, not elsewhere classified, left thigh: Secondary | ICD-10-CM | POA: Diagnosis not present

## 2023-06-25 DIAGNOSIS — M25552 Pain in left hip: Secondary | ICD-10-CM | POA: Diagnosis not present

## 2023-06-30 DIAGNOSIS — N312 Flaccid neuropathic bladder, not elsewhere classified: Secondary | ICD-10-CM | POA: Diagnosis not present

## 2023-06-30 DIAGNOSIS — R338 Other retention of urine: Secondary | ICD-10-CM | POA: Diagnosis not present

## 2023-06-30 DIAGNOSIS — N3000 Acute cystitis without hematuria: Secondary | ICD-10-CM | POA: Diagnosis not present

## 2023-07-05 ENCOUNTER — Other Ambulatory Visit: Payer: Self-pay

## 2023-07-05 ENCOUNTER — Emergency Department (HOSPITAL_COMMUNITY)
Admission: EM | Admit: 2023-07-05 | Discharge: 2023-07-05 | Disposition: A | Discharge status: Home / Self Care | Payer: PPO | Attending: Emergency Medicine | Admitting: Emergency Medicine

## 2023-07-05 ENCOUNTER — Emergency Department (HOSPITAL_COMMUNITY): Payer: PPO

## 2023-07-05 ENCOUNTER — Encounter (HOSPITAL_COMMUNITY): Payer: Self-pay | Admitting: Emergency Medicine

## 2023-07-05 DIAGNOSIS — K5900 Constipation, unspecified: Secondary | ICD-10-CM | POA: Diagnosis not present

## 2023-07-05 DIAGNOSIS — Z8551 Personal history of malignant neoplasm of bladder: Secondary | ICD-10-CM | POA: Diagnosis not present

## 2023-07-05 DIAGNOSIS — R1031 Right lower quadrant pain: Secondary | ICD-10-CM

## 2023-07-05 DIAGNOSIS — Z7901 Long term (current) use of anticoagulants: Secondary | ICD-10-CM | POA: Diagnosis not present

## 2023-07-05 DIAGNOSIS — N189 Chronic kidney disease, unspecified: Secondary | ICD-10-CM | POA: Insufficient documentation

## 2023-07-05 DIAGNOSIS — N200 Calculus of kidney: Secondary | ICD-10-CM | POA: Diagnosis not present

## 2023-07-05 DIAGNOSIS — Z79899 Other long term (current) drug therapy: Secondary | ICD-10-CM | POA: Insufficient documentation

## 2023-07-05 DIAGNOSIS — K5730 Diverticulosis of large intestine without perforation or abscess without bleeding: Secondary | ICD-10-CM | POA: Diagnosis not present

## 2023-07-05 DIAGNOSIS — I129 Hypertensive chronic kidney disease with stage 1 through stage 4 chronic kidney disease, or unspecified chronic kidney disease: Secondary | ICD-10-CM | POA: Diagnosis not present

## 2023-07-05 LAB — COMPREHENSIVE METABOLIC PANEL
ALT: 12 U/L (ref 0–44)
AST: 15 U/L (ref 15–41)
Albumin: 3.2 g/dL — ABNORMAL LOW (ref 3.5–5.0)
Alkaline Phosphatase: 136 U/L — ABNORMAL HIGH (ref 38–126)
Anion gap: 7 (ref 5–15)
BUN: 20 mg/dL (ref 8–23)
CO2: 24 mmol/L (ref 22–32)
Calcium: 8.4 mg/dL — ABNORMAL LOW (ref 8.9–10.3)
Chloride: 107 mmol/L (ref 98–111)
Creatinine, Ser: 1.64 mg/dL — ABNORMAL HIGH (ref 0.61–1.24)
GFR, Estimated: 43 mL/min — ABNORMAL LOW (ref >60)
Glucose, Bld: 96 mg/dL (ref 70–99)
Potassium: 3.5 mmol/L (ref 3.5–5.1)
Sodium: 138 mmol/L (ref 135–145)
Total Bilirubin: 0.6 mg/dL (ref 0.3–1.2)
Total Protein: 6.5 g/dL (ref 6.5–8.1)

## 2023-07-05 LAB — URINALYSIS, ROUTINE W REFLEX MICROSCOPIC
Bilirubin Urine: NEGATIVE
Glucose, UA: NEGATIVE mg/dL
Hgb urine dipstick: NEGATIVE
Ketones, ur: NEGATIVE mg/dL
Leukocytes,Ua: NEGATIVE
Nitrite: NEGATIVE
Protein, ur: >=300 mg/dL — AB
Specific Gravity, Urine: 1.02 (ref 1.005–1.030)
pH: 6 (ref 5.0–8.0)

## 2023-07-05 LAB — CBC
HCT: 40.3 % (ref 39.0–52.0)
Hemoglobin: 12.9 g/dL — ABNORMAL LOW (ref 13.0–17.0)
MCH: 28.6 pg (ref 26.0–34.0)
MCHC: 32 g/dL (ref 30.0–36.0)
MCV: 89.4 fL (ref 80.0–100.0)
Platelets: 225 10*3/uL (ref 150–400)
RBC: 4.51 MIL/uL (ref 4.22–5.81)
RDW: 13.5 % (ref 11.5–15.5)
WBC: 7.5 10*3/uL (ref 4.0–10.5)
nRBC: 0 % (ref 0.0–0.2)

## 2023-07-05 LAB — LIPASE, BLOOD: Lipase: 84 U/L — ABNORMAL HIGH (ref 11–51)

## 2023-07-05 MED ORDER — IOHEXOL 300 MG/ML  SOLN
80.0000 mL | Freq: Once | INTRAMUSCULAR | Status: AC | PRN
  Administered 2023-07-05: 80 mL via INTRAVENOUS

## 2023-07-05 NOTE — Discharge Instructions (Signed)
As we discussed, your workup in the ER today was reassuring for acute findings.  Laboratory evaluation and CT imaging did not reveal any emergent concerns.  However does appear that you may be constipated.  For this I recommend that you try a few capfuls of MiraLAX daily.  Follow-up with your primary care doctor in the next few days for continued evaluation and management of your symptoms.  I also recommend close urology follow-up for the incidental findings on your CT scan that I mentioned to you.  Return if development of any new or worsening symptoms.

## 2023-07-05 NOTE — ED Provider Notes (Signed)
[place] EMERGENCY DEPARTMENT AT [facility] Provider Note   CSN: [id number] Arrival date & time: 07/05/23  1307     History  Chief Complaint  Patient presents with   Abdominal Pain    Zachary [name] is a 77 y.o. male.  Patient with history of hypertension, CKD, bladder cancer status post transurethral resection in January 2024 presents today with complaints of abdominal pain.  Pain is in the right lower quadrant of his abdomen and does not radiate.  He states that same has been intermittent in nature for the last 2 weeks without discernible triggers.  No aggravating or relieving factors.  Does note some nausea and dry heaving but no more diarrhea.  He does currently have a Foley catheter in place that was placed by urology 1 week ago.  Prior to that he was performing self caths but was having worsening pain with doing so and therefore urology placed the catheter.  He is scheduled to have a urodynamic study in a few weeks.  He has been on several rounds of antibiotics recently for urinary tract infections.  He is currently on antibiotics for same, he is unsure which antibiotics he is on currently.  Denies any history of similar same pain previously.  No fevers or chills.  The history is provided by the patient. No language interpreter was used.  Abdominal Pain      Home Medications Prior to Admission medications   Medication Sig Start Date End Date Taking? Authorizing Provider  acetaminophen (TYLENOL) 500 MG tablet Take 1,000 mg by mouth at bedtime as needed for moderate pain.    [provider]  albuterol (VENTOLIN HFA) 108 (90 Base) MCG/ACT inhaler Inhale 2 puffs into the lungs every 4 (four) hours as needed for wheezing or shortness of breath. 11/13/22   Particia Nearing, PA-C  apixaban (ELIQUIS) 2.5 MG TABS tablet Take 1 tablet (2.5 mg total) by mouth 2 (two) times daily. 02/12/23 03/14/23  [place] Bali, PA-C  carvedilol (COREG) 3.125 MG tablet Take 3.125  mg by mouth 2 (two) times daily.    [provider]  dorzolamide-timolol (COSOPT) 2-0.5 % ophthalmic solution Place 1 drop into both eyes 2 (two) times daily. 01/02/23   [provider]  DULoxetine (CYMBALTA) 60 MG capsule TAKE 1 CAPSULE(60 MG) BY MOUTH DAILY 03/04/23   Myrlene Broker, MD  gabapentin (NEURONTIN) 300 MG capsule Take 300 mg by mouth at bedtime.    [provider]  guaiFENesin (MUCINEX) 600 MG 12 hr tablet Take 1 tablet (600 mg total) by mouth 2 (two) times daily. 11/13/22   Particia Nearing, PA-C  latanoprost (XALATAN) 0.005 % ophthalmic solution Place 1 drop into both eyes at bedtime.    [provider]  methocarbamol (ROBAXIN) 500 MG tablet Take 1 tablet (500 mg total) by mouth every 6 (six) hours as needed for muscle spasms. 02/12/23   [place] Bali, PA-C  oxyCODONE (OXY IR/ROXICODONE) 5 MG immediate release tablet Take 1 tablet (5 mg total) by mouth every 4 (four) hours as needed for moderate pain or severe pain. 02/12/23   [place] Bali, PA-C  pravastatin (PRAVACHOL) 10 MG tablet Take 10 mg by mouth at bedtime. 01/23/20   [provider]  promethazine-dextromethorphan (PROMETHAZINE-DM) 6.25-15 MG/5ML syrup Take 5 mLs by mouth 4 (four) times daily as needed. Patient not taking: Reported on 01/11/2023 11/13/22   Particia Nearing, PA-C  sodium bicarbonate 650 MG tablet Take 650 mg by  mouth daily.    [provider]  tamsulosin (FLOMAX) 0.4 MG CAPS capsule Take 0.4 mg by mouth daily.    [provider]  valsartan (DIOVAN) 40 MG tablet Take 40 mg by mouth daily.    [provider]      Allergies    Poison oak extract, Sulfonamide derivatives, and Sulfa antibiotics    Review of Systems   Review of Systems  Gastrointestinal:  Positive for abdominal pain.  All other systems reviewed and are negative.   Physical Exam Updated Vital Signs BP (!) 163/109 (BP Location: Right Arm)   Pulse 92   Temp  97.6 F (36.4 C)   Resp 16   Ht 6\' 1"  (1.854 m)   Wt 97.1 kg   SpO2 96%   BMI 28.23 kg/m  Physical Exam Vitals and nursing note reviewed.  Constitutional:      General: He is not in acute distress.    Appearance: Normal appearance. He is normal weight. He is not ill-appearing, toxic-appearing or diaphoretic.  HENT:     Head: Normocephalic and atraumatic.  Cardiovascular:     Rate and Rhythm: Normal rate.  Pulmonary:     Effort: Pulmonary effort is normal. No respiratory distress.  Abdominal:     General: Abdomen is flat.     Palpations: Abdomen is soft.     Tenderness: There is abdominal tenderness in the right lower quadrant.  Genitourinary:    Comments: Foley catheter in place, urine flowing into the back appropriately, urine clear in appearance, no blood visualized.  Musculoskeletal:        General: Normal range of motion.     Cervical back: Normal range of motion.  Skin:    General: Skin is warm and dry.  Neurological:     General: No focal deficit present.     Mental Status: He is alert.  Psychiatric:        Mood and Affect: Mood normal.        Behavior: Behavior normal.     ED Results / Procedures / Treatments   Labs (all labs ordered are listed, but only abnormal results are displayed) Labs Reviewed  LIPASE, BLOOD - Abnormal; Notable for the following components:      Result Value   Lipase 84 (*)    All other components within normal limits  COMPREHENSIVE METABOLIC PANEL - Abnormal; Notable for the following components:   Creatinine, Ser 1.64 (*)    Calcium 8.4 (*)    Albumin 3.2 (*)    Alkaline Phosphatase 136 (*)    GFR, Estimated 43 (*)    All other components within normal limits  CBC - Abnormal; Notable for the following components:   Hemoglobin 12.9 (*)    All other components within normal limits  URINALYSIS, ROUTINE W REFLEX MICROSCOPIC    EKG None  Radiology No results found.  Procedures Procedures    Medications Ordered in  ED Medications  iohexol (OMNIPAQUE) 300 MG/ML solution 80 mL (80 mLs Intravenous Contrast Given 07/05/23 1441)    ED Course/ Medical Decision Making/ A&P                             Medical Decision Making Amount and/or Complexity of Data Reviewed Labs: ordered. Radiology: ordered.  Risk Prescription drug management.   This patient is a 77 y.o. male who presents to the ED for concern of abdominal pain, this involves an extensive  number of treatment options, and is a complaint that carries with it a high risk of complications and morbidity. The emergent differential diagnosis prior to evaluation includes, but is not limited to,   AAA, gastroenteritis, appendicitis, Bowel obstruction, Bowel perforation. Gastroparesis, DKA, Hernia, Inflammatory bowel disease, mesenteric ischemia, pancreatitis, peritonitis SBP, volvulus.   This is not an exhaustive differential.   Past Medical History / Co-morbidities / Social History: history of hypertension, CKD, bladder cancer status post transurethral resection in January 2024  Additional history: Chart reviewed.   Physical Exam: Physical exam performed. The pertinent findings include: RLQ TTP without rebound or guarding  Lab Tests: I ordered, and personally interpreted labs.  The pertinent results include:  Kidney function at baseline. Alk phos 136, Lipase 84, low suspicion for correlation with patients pain as his symptoms are all RLQ. UA noninfectious   Imaging Studies: I ordered imaging studies including CT abdomen pelvis. I independently visualized and interpreted imaging which showed   1. No acute right lower quadrant finding by CT. Normal appendix. 2. Bilateral nonobstructing renal stones. 3. Indistinctness of both extrarenal pelvis regions, left more notable than right, similar to the last 2 examinations. Urothelial mass in this location is not excluded, but the appearance does not appear progressive. 4. Thick irregular bladder wall  as seen previously. Residual or recurrent tumor not excluded. 5. Diverticulosis of the left colon without evidence of diverticulitis. 6. Atherosclerosis. 7. Distant spinal fusion L3 through L5. No complicating feature identified. No evidence of regional metastatic disease.  I agree with the radiologist interpretation.  Medications: Offered pain medication which patient declined   Disposition: After consideration of the diagnostic results and the patients response to treatment, I feel that emergency department workup does not suggest an emergent condition requiring admission or immediate intervention beyond what has been performed at this time. The plan is: discharge with close outpatient follow-up.  CT imaging was overall benign, however on my personal review it does appear patient has relatively large stool burden in the right lower quadrant that could be causing his pain.  Patient does confirm that he has been more constipated recently.  Recommend Miralax for same.  Discussed with patient is understanding and in agreement.  I have also discussed the incidental findings on his CT scan and recommend that he discuss these findings with his urologist at his next appointment.  Evaluation and diagnostic testing in the emergency department does not suggest an emergent condition requiring admission or immediate intervention beyond what has been performed at this time.  Plan for discharge with close PCP follow-up.  Patient is understanding and amenable with plan, educated on red flag symptoms that would prompt immediate return.  Patient discharged in stable condition.  Final Clinical Impression(s) / ED Diagnoses Final diagnoses:  Right lower quadrant abdominal pain  Constipation, unspecified constipation type    Rx / DC Orders ED Discharge Orders     None     An After Visit Summary was printed and given to the patient.     Vear Clock 07/05/23 1752    Terrilee Files,  MD 07/05/23 [phone]

## 2023-07-05 NOTE — ED Notes (Signed)
Pt not in room.

## 2023-07-05 NOTE — ED Triage Notes (Signed)
Pt c/o RLQ abdominal pain x 2 weeks. Pt catheterizes himself and was recently put on abx for bladder infection. RLQ pain has not improved since abx were started and pt is concerned about his appendix. Pt has been nauseated and dizzy but denies vomiting, fever, chills, sweating. Hx bladder cancer but GB and appendix are intact.

## 2023-07-08 ENCOUNTER — Ambulatory Visit (INDEPENDENT_AMBULATORY_CARE_PROVIDER_SITE_OTHER): Payer: PPO | Admitting: Gastroenterology

## 2023-07-08 DIAGNOSIS — F418 Other specified anxiety disorders: Secondary | ICD-10-CM | POA: Diagnosis not present

## 2023-07-08 DIAGNOSIS — R109 Unspecified abdominal pain: Secondary | ICD-10-CM | POA: Diagnosis not present

## 2023-07-08 DIAGNOSIS — N390 Urinary tract infection, site not specified: Secondary | ICD-10-CM | POA: Diagnosis not present

## 2023-07-08 DIAGNOSIS — K5900 Constipation, unspecified: Secondary | ICD-10-CM | POA: Diagnosis not present

## 2023-07-09 DIAGNOSIS — M25652 Stiffness of left hip, not elsewhere classified: Secondary | ICD-10-CM | POA: Diagnosis not present

## 2023-07-09 DIAGNOSIS — R262 Difficulty in walking, not elsewhere classified: Secondary | ICD-10-CM | POA: Diagnosis not present

## 2023-07-09 DIAGNOSIS — M25552 Pain in left hip: Secondary | ICD-10-CM | POA: Diagnosis not present

## 2023-07-09 DIAGNOSIS — M62552 Muscle wasting and atrophy, not elsewhere classified, left thigh: Secondary | ICD-10-CM | POA: Diagnosis not present

## 2023-07-14 DIAGNOSIS — C678 Malignant neoplasm of overlapping sites of bladder: Secondary | ICD-10-CM | POA: Diagnosis not present

## 2023-07-14 DIAGNOSIS — R338 Other retention of urine: Secondary | ICD-10-CM | POA: Diagnosis not present

## 2023-07-21 ENCOUNTER — Telehealth (HOSPITAL_COMMUNITY): Payer: Self-pay

## 2023-07-21 DIAGNOSIS — G4733 Obstructive sleep apnea (adult) (pediatric): Secondary | ICD-10-CM | POA: Diagnosis not present

## 2023-07-21 NOTE — Telephone Encounter (Signed)
Its okay

## 2023-07-21 NOTE — Telephone Encounter (Signed)
Pt called in to schedule with Dr Tenny Craw pt hasn't been seen since 03/02/22. Please advise

## 2023-07-21 NOTE — Telephone Encounter (Signed)
Pt scheduled  

## 2023-07-26 ENCOUNTER — Encounter (HOSPITAL_COMMUNITY): Payer: Self-pay | Admitting: Psychiatry

## 2023-07-26 ENCOUNTER — Ambulatory Visit (INDEPENDENT_AMBULATORY_CARE_PROVIDER_SITE_OTHER): Payer: PPO | Admitting: Gastroenterology

## 2023-07-26 ENCOUNTER — Ambulatory Visit (HOSPITAL_COMMUNITY): Payer: PPO | Admitting: Psychiatry

## 2023-07-26 VITALS — BP 170/90 | Ht 73.0 in | Wt 201.0 lb

## 2023-07-26 DIAGNOSIS — F331 Major depressive disorder, recurrent, moderate: Secondary | ICD-10-CM

## 2023-07-26 MED ORDER — DULOXETINE HCL 60 MG PO CPEP: 60.0000 mg | ORAL_CAPSULE | Freq: Every day | ORAL | 3 refills | Status: DC

## 2023-07-26 NOTE — Patient Instructions (Signed)
Take Duloxetine EVERY DAY

## 2023-07-26 NOTE — Progress Notes (Signed)
[facility] MD/PA/NP OP Progress Note  07/26/2023 2:24 PM TROWA KINDRICK  MRN:  [id number]  Chief Complaint:  Chief Complaint  Patient presents with   Anxiety   Depression   Follow-up   HPI: This patient is a 77 year old married white male lives with his wife in [place].  They have no children.  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  The patient returns for follow-up after long absence of about 17 months.  He has not been doing well recently.  He states that over the last month or so he has been getting much more depressed.  He does not feel like eating and has lost 15 pounds.  He does not sleep all that well.  He has no energy or motivation to do anything.  He broke his left leg last February and has had surgery and is felt to be doing physical therapy but is not doing it.  He is not sure if he is taking the Cymbalta.  He thinks he is taking it but sporadically.  In fact he does not know which medicines he is taking.  I attempted to call his wife today but got no answer.  I explained to the patient that he and/or his wife need to clearly know his medication list and what he is taking on a daily basis.  I explained that we cannot know if the duloxetine is helping or not unless he starts to take it daily.  He promises that he will.  He is having a lot of other physical problems and is walking with a walker and is having frequent urinary infections by his report.  He now has to self cath. Visit Diagnosis:    ICD-10-CM   1. Major depressive disorder, recurrent episode, moderate (HCC)  F33.1       Past Psychiatric History: Outpatient treatment of depression and anxiety secondary to medical issues for the last several year   Past Medical History:  Past Medical History:  Diagnosis Date   Anxiety    takes Valium as needed   Arthritis    Bladder cancer (HCC)    takes Rapaflo daily   Blood dyscrasia    07/08/16: pt told he was a "free bleeder" after bleeding a lot when derm  cut off mole on forehead. No excessive bleeding with minor wounds at home, no bleeding problems perioperatively with prior surgeries.   Chronic back pain    spondylolisthesis   Cluster headaches    Depression    takes Lexapro daily   Essential hypertension, benign    takes Diovan-HCT daily   Hearing loss    hearing aids   History of kidney stones    Hx of complications due to general anesthesia    Aborted surgery 07/15/2016 due to hypotension per pt   Joint pain    Kidney stone 03/2019   passed stone   Obstructive sleep apnea on CPAP    uses CPAP @ night   Parkinsonism    Pneumonia    "several times"--last time about 3-4 yrs ago   Pre-diabetes    Prediabetes    Syncopal episodes    Thyroid condition     Past Surgical History:  Procedure Laterality Date   ANTERIOR CERVICAL DECOMP/DISCECTOMY FUSION N/A 06/05/2013   Procedure:  C3-4 Anterior Cervical Discectomy and Fusion, Allograft, Plate;  Surgeon: Eldred Manges, MD;  Location: [facility] OR;  Service: Orthopedics;  Laterality: N/A;  C3-4 Anterior Cervical Discectomy and Fusion,  Allograft, Plate   Arthroscopic knee surgery     BACK SURGERY      x 2   BLADDER SURGERY     x 5 to remove tumor   CARPAL TUNNEL RELEASE     CATARACT EXTRACTION W/PHACO  12/19/2012   Procedure: CATARACT EXTRACTION PHACO AND INTRAOCULAR LENS PLACEMENT (IOC);  Surgeon: Gemma Payor, MD;  Location: [facility]S;  Service: Ophthalmology;  Laterality: Left;  CDE: 26.80   CATARACT EXTRACTION W/PHACO  01/09/2013   Procedure: CATARACT EXTRACTION PHACO AND INTRAOCULAR LENS PLACEMENT (IOC);  Surgeon: Gemma Payor, MD;  Location: [facility];  Service: Ophthalmology;  Laterality: Right;  CDE:22.83   CERVICAL FUSION  06/05/2013   C 3  C4   CYSTOSCOPY     CYSTOSCOPY W/ URETERAL STENT PLACEMENT Bilateral 02/12/2020   Procedure: CYSTOSCOPY WITH RETROGRADE PYELOGRAM/URETERAL STENT PLACEMENT;  Surgeon: Bjorn Pippin, MD;  Location: [facility];  Service: Urology;  Laterality: Bilateral;    INTRAMEDULLARY (IM) NAIL INTERTROCHANTERIC Left 02/10/2023   Procedure: LEFT INTRAMEDULLARY (IM) NAILING OF LEFT FEMUR;  Surgeon: Roby Lofts, MD;  Location: [facility] OR;  Service: Orthopedics;  Laterality: Left;   LITHOTRIPSY  02/2020   REFRACTIVE SURGERY     Hx: of   RETINAL DETACHMENT SURGERY     TOTAL KNEE ARTHROPLASTY Left 02/01/2018   Procedure: TOTAL KNEE ARTHROPLASTY;  Surgeon: Vickki Hearing, MD;  Location: [facility];  Service: Orthopedics;  Laterality: Left;   TRANSURETHRAL RESECTION OF BLADDER TUMOR Right 07/27/2015   Procedure: TRANSURETHRAL RESECTION OF BLADDER TUMOR (TURBT);  Surgeon: Jethro Bolus, MD;  Location: [facility];  Service: Urology;  Laterality: Right;   TRANSURETHRAL RESECTION OF BLADDER TUMOR WITH MITOMYCIN-C N/A 01/25/2023   Procedure: TRANSURETHRAL RESECTION OF BLADDER TUMOR WITH GEMCITABINE;  Surgeon: Crista Elliot, MD;  Location: [facility];  Service: Urology;  Laterality: N/A;   wisdom tooth extraction      Family Psychiatric History: See below  Family History:  Family History  Problem Relation Age of Onset   Hypertension Father    Cancer Father        Prostate   Depression Mother    Hypertension Mother    Alcohol abuse Mother    COPD Mother        passed 07-08-17   Hypertension Sister    Anxiety disorder Sister    Alcohol abuse Maternal Grandfather    ADD / ADHD Neg Hx    Bipolar disorder Neg Hx    Dementia Neg Hx    Drug abuse Neg Hx    OCD Neg Hx    Paranoid behavior Neg Hx    Schizophrenia Neg Hx    Seizures Neg Hx    Sexual abuse Neg Hx    Physical abuse Neg Hx     Social History:  Social History   Socioeconomic History   Marital status: Married    Spouse name: Garment/textile technologist    Number of children: 0   Years of education: 13   Highest education level: Not on file  Occupational History   Occupation: 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[employer]    Employer: UNEMPLOYED  Tobacco Use   Smoking status: Former    Current packs/day: 0.00     Average packs/day: 0.3 packs/day for 1 year (0.3 ttl pk-yrs)    Types: Cigarettes, Cigars    Start date: 12/31/1975    Quit date: 12/30/1976    Years since quitting: 46.6   Smokeless tobacco: Never  Tobacco comments:    1985  Vaping Use   Vaping status: Never Used  Substance and Sexual Activity   Alcohol use: No    Comment: quit 1990   Drug use: No   Sexual activity: Yes  Other Topics Concern   Not on file  Social History Narrative   Lives with wife    caffeine- sodas   Right handed   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    Social Determinants of Health   Financial Resource Strain: Not on file  Food Insecurity: No Food Insecurity (02/10/2023)   Hunger Vital Sign    Worried About Running Out of Food in the Last Year: Never true    Ran Out of Food in the Last Year: Never true  Transportation Needs: No Transportation Needs (02/10/2023)   PRAPARE - Administrator, Civil Service (Medical): No    Lack of Transportation (Non-Medical): No  Physical Activity: Not on file  Stress: Not on file  Social Connections: Not on file    Allergies:  Allergies  Allergen Reactions   Poison Oak Extract Hives and Itching   Sulfonamide Derivatives Itching and Rash   Sulfa Antibiotics     Metabolic Disorder Labs: Lab Results  Component Value Date   HGBA1C 6.3 12/01/2022   MPG 120 07/22/2016   No results found for: "PROLACTIN" No results found for: "CHOL", "TRIG", "HDL", "CHOLHDL", "VLDL", "LDLCALC" Lab Results  Component Value Date   TSH 2.701 02/03/2021    Therapeutic Level Labs: No results found for: "LITHIUM" No results found for: "VALPROATE" Lab Results  Component Value Date   CBMZ 8.2 04/10/2013    Current Medications: Current Outpatient Medications  Medication Sig Dispense Refill   acetaminophen (TYLENOL) 500 MG tablet Take 1,000 mg by mouth at bedtime as needed for moderate pain.     albuterol (VENTOLIN HFA) 108 (90 Base) MCG/ACT inhaler Inhale 2 puffs into  the lungs every 4 (four) hours as needed for wheezing or shortness of breath. 18 g 0   apixaban (ELIQUIS) 2.5 MG TABS tablet Take 1 tablet (2.5 mg total) by mouth 2 (two) times daily. 60 tablet 0   carvedilol (COREG) 3.125 MG tablet Take 3.125 mg by mouth 2 (two) times daily.     dorzolamide-timolol (COSOPT) 2-0.5 % ophthalmic solution Place 1 drop into both eyes 2 (two) times daily.     DULoxetine (CYMBALTA) 60 MG capsule Take 1 capsule (60 mg total) by mouth daily with breakfast. TAKE 1 CAPSULE(60 MG) BY MOUTH DAILY 90 capsule 3   gabapentin (NEURONTIN) 300 MG capsule Take 300 mg by mouth at bedtime.     guaiFENesin (MUCINEX) 600 MG 12 hr tablet Take 1 tablet (600 mg total) by mouth 2 (two) times daily. 20 tablet 0   latanoprost (XALATAN) 0.005 % ophthalmic solution Place 1 drop into both eyes at bedtime.     methocarbamol (ROBAXIN) 500 MG tablet Take 1 tablet (500 mg total) by mouth every 6 (six) hours as needed for muscle spasms. 28 tablet 0   oxyCODONE (OXY IR/ROXICODONE) 5 MG immediate release tablet Take 1 tablet (5 mg total) by mouth every 4 (four) hours as needed for moderate pain or severe pain. 30 tablet 0   pravastatin (PRAVACHOL) 10 MG tablet Take 10 mg by mouth at bedtime.     sodium bicarbonate 650 MG tablet Take 650 mg by mouth daily.     tamsulosin (FLOMAX) 0.4 MG CAPS capsule Take 0.4 mg by mouth daily.  valsartan (DIOVAN) 40 MG tablet Take 40 mg by mouth daily.     No current facility-administered medications for this visit.     Musculoskeletal: Strength & Muscle Tone: decreased Gait & Station: unsteady Patient leans: N/A  Psychiatric Specialty Exam: Review of Systems  Constitutional:  Positive for activity change and appetite change.  Genitourinary:  Positive for difficulty urinating.  Musculoskeletal:  Positive for arthralgias and gait problem.  Psychiatric/Behavioral:  Positive for confusion, decreased concentration, dysphoric mood and sleep disturbance. The  patient is nervous/anxious.     There were no vitals taken for this visit.There is no height or weight on file to calculate BMI.  General Appearance: Casual and Fairly Groomed  Eye Contact:  Fair  Speech:  Slow  Volume:  Decreased  Mood:  Depressed  Affect:  Flat  Thought Process:  Goal Directed  Orientation:  Full (Time, Place, and Person)  Thought Content: Rumination   Suicidal Thoughts:  No  Homicidal Thoughts:  No  Memory:  Immediate;   Fair Recent;   Poor Remote;   Poor  Judgement:  Poor  Insight:  Lacking  Psychomotor Activity:  Shuffling Gait  Concentration:  Concentration: Fair and Attention Span: Fair  Recall:  [employer] of Knowledge: Fair  Language: Good  Akathisia:  No  Handed:  Right  AIMS (if indicated): not done  Assets:  Communication Skills Desire for Improvement Resilience Social Support  ADL's:  Intact  Cognition: Impaired,  Mild  Sleep:  Fair   Screenings: CAGE-AID    Flowsheet Row ED to Hosp-Admission (Discharged) from 02/09/2023 in [facility]  CAGE-AID Score 0      Mini-Mental    Flowsheet Row Office Visit from 10/15/2020 in [facility] Office Visit from 10/10/2019 in [facility] Office Visit from 09/27/2018 in [facility] Office Visit from 12/22/2017 in [facility] Office Visit from 09/21/2017 in [facility]  Total Score (max 30 points ) 27 28 29 30 28       PHQ2-9    Flowsheet Row Office Visit from 07/26/2023 in [facility] [place] Video Visit from 03/02/2022 in [facility] [place] Office Visit from 09/20/2018 in [facility]  PHQ-2 Total Score 6 0 0  PHQ-9 Total Score 18 -- --      Flowsheet Row Office Visit from 07/26/2023 in [facility] [place] ED from 07/05/2023 in [facility] Emergency Department at [facility] ED from 02/23/2023 in [facility] Emergency Department at [facility]  C-SSRS RISK CATEGORY No Risk No Risk No Risk        Assessment and Plan: This patient is a 77 year old male with a history of depression and anxiety.  He seems to have declined both physically and mentally.  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  He does not recall what medicines he is takes and he is by himself today.  We will need to call his pharmacy to verify.  He does not think he is taking his antidepressant daily and I urged him to do this for 4 weeks so we can see if it will help.  Therefore he will continue the Cymbalta 60 mg daily.  Collaboration of Care: Collaboration of Care: Primary Care Provider AEB notes will be shared with PCP at patient's request  Patient/Guardian  was advised Release of Information must be obtained prior to any record release in order to collaborate their care with an outside provider. Patient/Guardian was advised if they have not already done so to contact the registration department to sign all necessary forms in order for [place] to release information regarding their care.   Consent: Patient/Guardian gives verbal consent for treatment and assignment of benefits for services provided during this visit. Patient/Guardian expressed understanding and agreed to proceed.    Diannia Ruder, MD 07/26/2023, 2:24 PM

## 2023-07-27 DIAGNOSIS — R339 Retention of urine, unspecified: Secondary | ICD-10-CM | POA: Diagnosis not present

## 2023-08-06 DIAGNOSIS — H31091 Other chorioretinal scars, right eye: Secondary | ICD-10-CM | POA: Diagnosis not present

## 2023-08-06 DIAGNOSIS — H35352 Cystoid macular degeneration, left eye: Secondary | ICD-10-CM | POA: Diagnosis not present

## 2023-08-06 DIAGNOSIS — H30042 Focal chorioretinal inflammation, macular or paramacular, left eye: Secondary | ICD-10-CM | POA: Diagnosis not present

## 2023-08-19 DIAGNOSIS — Z79899 Other long term (current) drug therapy: Secondary | ICD-10-CM | POA: Diagnosis not present

## 2023-08-19 DIAGNOSIS — Z960 Presence of urogenital implants: Secondary | ICD-10-CM | POA: Diagnosis not present

## 2023-08-19 DIAGNOSIS — F418 Other specified anxiety disorders: Secondary | ICD-10-CM | POA: Diagnosis not present

## 2023-08-19 DIAGNOSIS — Z87442 Personal history of urinary calculi: Secondary | ICD-10-CM | POA: Diagnosis not present

## 2023-08-19 DIAGNOSIS — Z6828 Body mass index (BMI) 28.0-28.9, adult: Secondary | ICD-10-CM | POA: Diagnosis not present

## 2023-08-19 DIAGNOSIS — Z8551 Personal history of malignant neoplasm of bladder: Secondary | ICD-10-CM | POA: Diagnosis not present

## 2023-08-19 DIAGNOSIS — R339 Retention of urine, unspecified: Secondary | ICD-10-CM | POA: Diagnosis not present

## 2023-08-19 DIAGNOSIS — Z713 Dietary counseling and surveillance: Secondary | ICD-10-CM | POA: Diagnosis not present

## 2023-08-19 DIAGNOSIS — I10 Essential (primary) hypertension: Secondary | ICD-10-CM | POA: Diagnosis not present

## 2023-08-21 DIAGNOSIS — G4733 Obstructive sleep apnea (adult) (pediatric): Secondary | ICD-10-CM | POA: Diagnosis not present

## 2023-08-23 ENCOUNTER — Telehealth (HOSPITAL_COMMUNITY): Payer: PPO | Admitting: Psychiatry

## 2023-08-23 ENCOUNTER — Encounter (HOSPITAL_COMMUNITY): Payer: Self-pay | Admitting: Psychiatry

## 2023-08-23 DIAGNOSIS — F331 Major depressive disorder, recurrent, moderate: Secondary | ICD-10-CM

## 2023-08-23 MED ORDER — FLUOXETINE HCL 20 MG PO CAPS: 20.0000 mg | ORAL_CAPSULE | ORAL | 2 refills | Status: DC

## 2023-08-23 NOTE — Progress Notes (Signed)
Virtual Visit via Telephone Note  I connected with SIERRA WENZLICK on 08/23/23 at 11:00 AM EDT by telephone and verified that I am speaking with the correct person using two identifiers.  Location: Patient: home Provider: office   I discussed the limitations, risks, security and privacy concerns of performing an evaluation and management service by telephone and the availability of in person appointments. I also discussed with the patient that there may be a patient responsible charge related to this service. The patient expressed understanding and agreed to proceed.      I discussed the assessment and treatment plan with the patient. The patient was provided an opportunity to ask questions and all were answered. The patient agreed with the plan and demonstrated an understanding of the instructions.   The patient was advised to call back or seek an in-person evaluation if the symptoms worsen or if the condition fails to improve as anticipated.  I provided 15 minutes of non-face-to-face time during this encounter.   Diannia Ruder, MD  [facility] MD/PA/NP OP Progress Note  08/23/2023 11:21 AM ADRIAL TY  MRN:  [id number]  Chief Complaint:  Chief Complaint  Patient presents with   Depression   Follow-up   HPI: This patient is a 77 year old married white male who lives with his wife in [place].  They have no children.  He is retired from the Scientist, product/process development.  The patient returns for follow-up after 4 weeks.  Last time he stated he was quite depressed not sleeping well not eating with 15 pound weight loss and no energy and no motivation.  He states that he was not sure if he was taking the Cymbalta regularly so I sent it back in for him.  Today I spoke to both the patient and the wife.  She states that she is trying to help him take his medications more consistently by putting them in a pill organizer.  He still is not taking everything as prescribed.  He is very fixated on the problems  with his bladder which really cannot be helped do it to the flaccidity.  His wife states that he is having more and more memory issues and she has brought it up with his PCP Dr. Margo Aye and they are going to look into it next visit.  She does not think that the Cymbalta has helped at all with the depression as he often sits around and has no energy or motivation.  I explained that if he is developing Alzheimer's this might be the first beginning signs.  Nevertheless I think we should retry another antidepressant such as Prozac which has a lot more activating properties.  They are in agreement. Visit Diagnosis:    ICD-10-CM   1. Major depressive disorder, recurrent episode, moderate (HCC)  F33.1       Past Psychiatric History: Long-term outpatient treatment  Past Medical History:  Past Medical History:  Diagnosis Date   Anxiety    takes Valium as needed   Arthritis    Bladder cancer (HCC)    takes Rapaflo daily   Blood dyscrasia    07/08/16: pt told he was a "free bleeder" after bleeding a lot when derm cut off mole on forehead. No excessive bleeding with minor wounds at home, no bleeding problems perioperatively with prior surgeries.   Chronic back pain    spondylolisthesis   Cluster headaches    Depression    takes Lexapro daily   Essential hypertension, benign    takes  Diovan-HCT daily   Hearing loss    hearing aids   History of kidney stones    Hx of complications due to general anesthesia    Aborted surgery 07/15/2016 due to hypotension per pt   Joint pain    Kidney stone 03/2019   passed stone   Obstructive sleep apnea on CPAP    uses CPAP @ night   Parkinsonism    Pneumonia    "several times"--last time about 3-4 yrs ago   Pre-diabetes    Prediabetes    Syncopal episodes    Thyroid condition     Past Surgical History:  Procedure Laterality Date   ANTERIOR CERVICAL DECOMP/DISCECTOMY FUSION N/A 06/05/2013   Procedure:  C3-4 Anterior Cervical Discectomy and Fusion,  Allograft, Plate;  Surgeon: Eldred Manges, MD;  [facility];  Service: Orthopedics;  Laterality: N/A;  C3-4 Anterior Cervical Discectomy and Fusion, Allograft, Plate   Arthroscopic knee surgery     BACK SURGERY      x 2   BLADDER SURGERY     x 5 to remove tumor   CARPAL TUNNEL RELEASE     CATARACT EXTRACTION W/PHACO  12/19/2012   Procedure: CATARACT EXTRACTION PHACO AND INTRAOCULAR LENS PLACEMENT (IOC);  Surgeon: Gemma Payor, MD;  Location: [facility];  Service: Ophthalmology;  Laterality: Left;  CDE: 26.80   CATARACT EXTRACTION W/PHACO  01/09/2013   Procedure: CATARACT EXTRACTION PHACO AND INTRAOCULAR LENS PLACEMENT (IOC);  Surgeon: Gemma Payor, MD;  Location: [facility];  Service: Ophthalmology;  Laterality: Right;  CDE:22.83   CERVICAL FUSION  06/05/2013   C 3  C4   CYSTOSCOPY     CYSTOSCOPY W/ URETERAL STENT PLACEMENT Bilateral 02/12/2020   Procedure: CYSTOSCOPY WITH RETROGRADE PYELOGRAM/URETERAL STENT PLACEMENT;  Surgeon: Bjorn Pippin, MD;  [facility]: [facility];  Service: Urology;  Laterality: Bilateral;   INTRAMEDULLARY (IM) NAIL INTERTROCHANTERIC Left 02/10/2023   Procedure: LEFT INTRAMEDULLARY (IM) NAILING OF LEFT FEMUR;  Surgeon: Roby Lofts, MD;  Location: [facility];  Service: Orthopedics;  Laterality: Left;   LITHOTRIPSY  02/2020   REFRACTIVE SURGERY     Hx: of   RETINAL DETACHMENT SURGERY     TOTAL KNEE ARTHROPLASTY Left 02/01/2018   Procedure: TOTAL KNEE ARTHROPLASTY;  Surgeon: Vickki Hearing, MD;  Location: [facility];  Service: Orthopedics;  Laterality: Left;   TRANSURETHRAL RESECTION OF BLADDER TUMOR Right 07/27/2015   Procedure: TRANSURETHRAL RESECTION OF BLADDER TUMOR (TURBT);  Surgeon: Jethro Bolus, MD;  [facility];  Service: Urology;  Laterality: Right;   TRANSURETHRAL RESECTION OF BLADDER TUMOR WITH MITOMYCIN-C N/A 01/25/2023   Procedure: TRANSURETHRAL RESECTION OF BLADDER TUMOR WITH GEMCITABINE;  Surgeon: Crista Elliot, MD;  [facility];  Service: Urology;   Laterality: N/A;   wisdom tooth extraction      Family Psychiatric History: See below  Family History:  Family History  Problem Relation Age of Onset   Hypertension Father    Cancer Father        Prostate   Depression Mother    Hypertension Mother    Alcohol abuse Mother    COPD Mother        passed 07-08-17   Hypertension Sister    Anxiety disorder Sister    Alcohol abuse Maternal Grandfather    ADD / ADHD Neg Hx    Bipolar disorder Neg Hx    Dementia Neg Hx    Drug abuse Neg Hx    OCD Neg Hx  Paranoid behavior Neg Hx    Schizophrenia Neg Hx    Seizures Neg Hx    Sexual abuse Neg Hx    Physical abuse Neg Hx     Social History:  Social History   Socioeconomic History   Marital status: Married    Spouse name: Garment/textile technologist    Number of children: 0   Years of education: 13   Highest education level: Not on file  Occupational History   Occupation: 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[employer]    Employer: UNEMPLOYED  Tobacco Use   Smoking status: Former    Current packs/day: 0.00    Average packs/day: 0.3 packs/day for 1 year (0.3 ttl pk-yrs)    Types: Cigarettes, Cigars    Start date: 12/31/1975    Quit date: 12/30/1976    Years since quitting: 46.6   Smokeless tobacco: Never   Tobacco comments:    1985  Vaping Use   Vaping status: Never Used  Substance and Sexual Activity   Alcohol use: No    Comment: quit 1990   Drug use: No   Sexual activity: Yes  Other Topics Concern   Not on file  Social History Narrative   Lives with wife    caffeine- sodas   Right handed   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    Social Determinants of Health   Financial Resource Strain: Not on file  Food Insecurity: No Food Insecurity (02/10/2023)   Hunger Vital Sign    Worried About Running Out of Food in the Last Year: Never true    Ran Out of Food in the Last Year: Never true  Transportation Needs: No Transportation Needs (02/10/2023)   PRAPARE - Scientist, research (physical sciences) (Medical): No    Lack of Transportation (Non-Medical): No  Physical Activity: Not on file  Stress: Not on file  Social Connections: Not on file    Allergies:  Allergies  Allergen Reactions   Poison Oak Extract Hives and Itching   Sulfonamide Derivatives Itching and Rash   Sulfa Antibiotics     Metabolic Disorder Labs: Lab Results  Component Value Date   HGBA1C 6.3 12/01/2022   MPG 120 07/22/2016   No results found for: "PROLACTIN" No results found for: "CHOL", "TRIG", "HDL", "CHOLHDL", "VLDL", "LDLCALC" Lab Results  Component Value Date   TSH 2.701 02/03/2021    Therapeutic Level Labs: No results found for: "LITHIUM" No results found for: "VALPROATE" Lab Results  Component Value Date   CBMZ 8.2 04/10/2013    Current Medications: Current Outpatient Medications  Medication Sig Dispense Refill   bethanechol (URECHOLINE) 25 MG tablet Take 25 mg by mouth 3 (three) times daily.     acetaminophen (TYLENOL) 500 MG tablet Take 1,000 mg by mouth at bedtime as needed for moderate pain.     albuterol (VENTOLIN HFA) 108 (90 Base) MCG/ACT inhaler Inhale 2 puffs into the lungs every 4 (four) hours as needed for wheezing or shortness of breath. 18 g 0   carvedilol (COREG) 3.125 MG tablet Take 3.125 mg by mouth 2 (two) times daily.     dorzolamide-timolol (COSOPT) 2-0.5 % ophthalmic solution Place 1 drop into both eyes 2 (two) times daily.     FLUoxetine (PROZAC) 20 MG capsule Take 1 capsule (20 mg total) by mouth every morning. 30 capsule 2   latanoprost (XALATAN) 0.005 % ophthalmic solution Place 1 drop into both eyes at bedtime.     pravastatin (  PRAVACHOL) 10 MG tablet Take 10 mg by mouth at bedtime.     sodium bicarbonate 650 MG tablet Take 650 mg by mouth daily.     tamsulosin (FLOMAX) 0.4 MG CAPS capsule Take 0.4 mg by mouth daily.     No current facility-administered medications for this visit.     Musculoskeletal: Strength & Muscle Tone: na Gait &  Station: na Patient leans: N/A  Psychiatric Specialty Exam: Review of Systems  Constitutional:  Positive for fatigue.  Genitourinary:  Positive for dysuria.  Musculoskeletal:  Positive for arthralgias and gait problem.  Psychiatric/Behavioral:  Positive for confusion and dysphoric mood.   All other systems reviewed and are negative.   There were no vitals taken for this visit.There is no height or weight on file to calculate BMI.  General Appearance: NA  Eye Contact:  NA  Speech:  Clear and Coherent  Volume:  Decreased  Mood:  Dysphoric  Affect:  NA  Thought Process:  Goal Directed  Orientation:  Full (Time, Place, and Person)  Thought Content: Rumination   Suicidal Thoughts:  No  Homicidal Thoughts:  No  Memory:  Immediate;   Fair Recent;   Poor Remote;   NA  Judgement:  Fair  Insight:  Shallow  Psychomotor Activity:  Decreased  Concentration:  Concentration: Fair and Attention Span: Fair  Recall:  Poor  Fund of Knowledge: Fair  Language: Good  Akathisia:  No  Handed:  Right  AIMS (if indicated): not done  Assets:  Communication Skills Desire for Improvement Resilience Social Support  ADL's:  Intact  Cognition: Impaired,  Mild  Sleep:  Fair   Screenings: CAGE-AID    Flowsheet Row ED to Hosp-Admission (Discharged) from 02/09/2023 in [facility]  CAGE-AID Score 0      Mini-Mental    Flowsheet Row Office Visit from 10/15/2020 in [facility] Office Visit from 10[facility]10/2019 in [facility] Office Visit from 09[facility]27/2018 in [facility] Office Visit from 12/22/2017 in [facility] Office Visit from 09/21/2017 in [facility]  Total Score (max 30 points ) 27 28 29 30 28       PHQ2-9    Flowsheet Row Office Visit from 07/26/2023 in [facility] [place] Video Visit from  03/02/2022 in [facility] [place] Office Visit from 09/20/2018 in [facility]  PHQ-2 Total Score 6 0 0  PHQ-9 Total Score 18 -- --      Flowsheet Row Office Visit from 07/26/2023 in [facility] [place] ED from 07/05/2023 in [facility] Emergency Department at [facility] ED from 02/23/2023 in [facility] Emergency Department at [facility]  C-SSRS RISK CATEGORY No Risk No Risk No Risk        Assessment and Plan: This patient is a 76 year old male with a history of depression and anxiety.  As noted last time he is probably in an early stage of dementia.  Even though he has been taking the Cymbalta daily is not helping over 4 weeks.  Will therefore switch to Prozac 20 mg every morning.  He will return to see me in 6 weeks  Collaboration of Care: Collaboration of Care: Primary Care Provider AEB notes will be shared with PCP at patient's request  Patient/Guardian was advised Release of Information must be obtained prior to any record release  in order to collaborate their care with an outside provider. Patient/Guardian was advised if they have not already done so to contact the registration department to sign all necessary forms in order for [place] to release information regarding their care.   Consent: Patient/Guardian gives verbal consent for treatment and assignment of benefits for services provided during this visit. Patient/Guardian expressed understanding and agreed to proceed.    Diannia Ruder, MD 08/23/2023, 11:21 AM

## 2023-08-26 DIAGNOSIS — C678 Malignant neoplasm of overlapping sites of bladder: Secondary | ICD-10-CM | POA: Diagnosis not present

## 2023-08-26 DIAGNOSIS — N312 Flaccid neuropathic bladder, not elsewhere classified: Secondary | ICD-10-CM | POA: Diagnosis not present

## 2023-09-01 DIAGNOSIS — N401 Enlarged prostate with lower urinary tract symptoms: Secondary | ICD-10-CM | POA: Diagnosis not present

## 2023-09-01 DIAGNOSIS — R3914 Feeling of incomplete bladder emptying: Secondary | ICD-10-CM | POA: Diagnosis not present

## 2023-09-03 ENCOUNTER — Other Ambulatory Visit: Payer: Self-pay | Admitting: Urology

## 2023-09-03 DIAGNOSIS — R339 Retention of urine, unspecified: Secondary | ICD-10-CM | POA: Diagnosis not present

## 2023-09-03 DIAGNOSIS — H30042 Focal chorioretinal inflammation, macular or paramacular, left eye: Secondary | ICD-10-CM | POA: Diagnosis not present

## 2023-09-03 DIAGNOSIS — H35352 Cystoid macular degeneration, left eye: Secondary | ICD-10-CM | POA: Diagnosis not present

## 2023-09-15 ENCOUNTER — Ambulatory Visit: Payer: PPO | Admitting: Urology

## 2023-09-15 ENCOUNTER — Encounter: Payer: Self-pay | Admitting: Urology

## 2023-09-15 DIAGNOSIS — N312 Flaccid neuropathic bladder, not elsewhere classified: Secondary | ICD-10-CM | POA: Diagnosis not present

## 2023-09-15 DIAGNOSIS — D649 Anemia, unspecified: Secondary | ICD-10-CM | POA: Diagnosis not present

## 2023-09-15 DIAGNOSIS — E118 Type 2 diabetes mellitus with unspecified complications: Secondary | ICD-10-CM | POA: Diagnosis not present

## 2023-09-15 DIAGNOSIS — Z8551 Personal history of malignant neoplasm of bladder: Secondary | ICD-10-CM

## 2023-09-15 DIAGNOSIS — E782 Mixed hyperlipidemia: Secondary | ICD-10-CM | POA: Diagnosis not present

## 2023-09-15 DIAGNOSIS — R339 Retention of urine, unspecified: Secondary | ICD-10-CM | POA: Diagnosis not present

## 2023-09-15 DIAGNOSIS — C679 Malignant neoplasm of bladder, unspecified: Secondary | ICD-10-CM

## 2023-09-15 DIAGNOSIS — N1832 Chronic kidney disease, stage 3b: Secondary | ICD-10-CM | POA: Diagnosis not present

## 2023-09-15 DIAGNOSIS — E559 Vitamin D deficiency, unspecified: Secondary | ICD-10-CM | POA: Diagnosis not present

## 2023-09-15 NOTE — Progress Notes (Signed)
Zachary [name],acting as a scribe for Zachary Scotland, MD.,have documented all relevant documentation on the behalf of Zachary Scotland, MD,as directed by  Zachary Scotland, MD while in the presence of Zachary Scotland, MD.  09/15/2023 3:57 PM   Zachary [name] Nov 13, 1946 [id number]  Referring provider: Benita Stabile, MD [address] Zachary [name],  [place] [address]  Chief Complaint  Patient presents with   Establish Care   discuss urolift    HPI:  77 year-old male who presents today for further evaluation of UroLift. It appears that he is a patient of [facility]. Records indicate that he had several surgeries by Dr. Patsi [name], followed by Dr. Bretta [name], followed by Dr. Alvester [name] most recently in 12/2022 for recurrent bladder cancer. Records from [facility] were requested. 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   He also has a personal history of kidney stones requiring right ureteroscopy back in 2023.   He has also developed urinary issues including retention and incomplete bladder emptying. He is on CIC 3-4 times daily. He underwent urodynamics in 06/2023 indicating that he has a max bladder capacity of 707 mLs. First sensation was delayed at 588. He had no instability. He was able to generate a voluntary contraction but did not void. His max detrusor pressure while attempting to void was 30 cm of water. He did have some trabeculation and elevation of the bladder neck. No reflux was seen. Prostate volume is only 25-30 ct by ct. He was not offered an outlet procedure based on these findings. He was started on bethanecol.   His last surveillance cystoscopy was on 08/26/2023. He had a second opinion with Dr. Mena [name] on 09/01/2023. He indicated based on his hypotonic bladder, large capacity.  He had another cystoscopy with Dr. Mena [name], which reported some lateral lobe hypertrophy. He was started back on Flomax at that point. It appears  that his last appointment with Dr. Mena [name], they did have a long discussion about whether or not he would truly benefit from an outlet procedure. He was counseled on the risk of failure and other issues, including incontinence and stricture, which could potentially make his problems worse. Ultimately, he elected for TURP.   Today, he reports that he has leakage of urine.    PMH: Past Medical History:  Diagnosis Date   Anxiety    takes Valium as needed   Arthritis    Bladder cancer (HCC)    takes Rapaflo daily   Blood dyscrasia    07/08/16: pt told he was a "free bleeder" after bleeding a lot when derm cut off mole on forehead. No excessive bleeding with minor wounds at home, no bleeding problems perioperatively with prior surgeries.   Chronic back pain    spondylolisthesis   Cluster headaches    Depression    takes Lexapro daily   Essential hypertension, benign    takes Diovan-HCT daily   [name] loss    [name] aids   History of kidney stones    Hx of complications due to general anesthesia    Aborted surgery 07/15/2016 due to hypotension per pt   Joint pain    Kidney stone 03/2019   passed stone   Obstructive sleep apnea on CPAP    uses CPAP @ night   Parkinsonism    Pneumonia    "several times"--last time about 3-4 yrs ago   Pre-diabetes    Prediabetes    Syncopal episodes  Thyroid condition     Surgical History: Past Surgical History:  Procedure Laterality Date   ANTERIOR CERVICAL DECOMP/DISCECTOMY FUSION N/A 06/05/2013   Procedure:  C3-4 Anterior Cervical Discectomy and Fusion, Allograft, Plate;  Surgeon: Eldred Manges, MD;  [facility];  Service: Orthopedics;  Laterality: N/A;  C3-4 Anterior Cervical Discectomy and Fusion, Allograft, Plate   Arthroscopic knee surgery     BACK SURGERY      x 2   BLADDER SURGERY     x 5 to remove tumor   CARPAL TUNNEL RELEASE     CATARACT EXTRACTION W/PHACO  12/19/2012   Procedure: CATARACT EXTRACTION PHACO AND INTRAOCULAR  LENS PLACEMENT (IOC);  Surgeon: Zachary Payor, MD;  Location: [facility];  Service: Ophthalmology;  Laterality: Left;  CDE: 26.80   CATARACT EXTRACTION W/PHACO  01/09/2013   Procedure: CATARACT EXTRACTION PHACO AND INTRAOCULAR LENS PLACEMENT (IOC);  Surgeon: Zachary Payor, MD;  Location: [facility];  Service: Ophthalmology;  Laterality: Right;  CDE:22.83   CERVICAL FUSION  06/05/2013   C 3  C4   CYSTOSCOPY     CYSTOSCOPY W/ URETERAL STENT PLACEMENT Bilateral 02/12/2020   Procedure: CYSTOSCOPY WITH RETROGRADE PYELOGRAM/URETERAL STENT PLACEMENT;  Surgeon: Zachary Pippin, MD;  [facility];  Service: Urology;  Laterality: Bilateral;   INTRAMEDULLARY (IM) NAIL INTERTROCHANTERIC Left 02/10/2023   Procedure: LEFT INTRAMEDULLARY (IM) NAILING OF LEFT FEMUR;  Surgeon: Zachary Lofts, MD;  [facility];  Service: Orthopedics;  Laterality: Left;   LITHOTRIPSY  02/2020   REFRACTIVE SURGERY     Hx: of   RETINAL DETACHMENT SURGERY     TOTAL KNEE ARTHROPLASTY Left 02/01/2018   Procedure: TOTAL KNEE ARTHROPLASTY;  Surgeon: Zachary Hearing, MD;  Location: [facility];  Service: Orthopedics;  Laterality: Left;   TRANSURETHRAL RESECTION OF BLADDER TUMOR Right 07/27/2015   Procedure: TRANSURETHRAL RESECTION OF BLADDER TUMOR (TURBT);  Surgeon: Zachary Bolus, MD;  [facility];  Service: Urology;  Laterality: Right;   TRANSURETHRAL RESECTION OF BLADDER TUMOR WITH MITOMYCIN-C N/A 01/25/2023   Procedure: TRANSURETHRAL RESECTION OF BLADDER TUMOR WITH GEMCITABINE;  Surgeon: Zachary Elliot, MD;  [facility];  Service: Urology;  Laterality: N/A;   wisdom tooth extraction      Home Medications:  Allergies as of 09/15/2023       Reactions   Poison Oak Extract Hives, Itching   Sulfonamide Derivatives Itching, Rash   Sulfa Antibiotics         Medication List        Accurate as of September 15, 2023  3:57 PM. If you have any questions, ask your nurse or doctor.          STOP taking these medications     albuterol 108 (90 Base) MCG/ACT inhaler Commonly known as: VENTOLIN HFA Stopped by: Zachary [name]       TAKE these medications    acetaminophen 500 MG tablet Commonly known as: TYLENOL Take 1,000 mg by mouth at bedtime as needed for moderate pain.   bethanechol 25 MG tablet Commonly known as: URECHOLINE Take 25 mg by mouth 3 (three) times daily.   carvedilol 3.125 MG tablet Commonly known as: COREG Take 3.125 mg by mouth 2 (two) times daily.   dorzolamide-timolol 2-0.5 % ophthalmic solution Commonly known as: COSOPT Place 1 drop into both eyes 2 (two) times daily.   FLUoxetine 20 MG capsule Commonly known as: PROZAC Take 1 capsule (20 mg total) by mouth every morning.   latanoprost 0.005 %  ophthalmic solution Commonly known as: XALATAN Place 1 drop into both eyes at bedtime.   multivitamin tablet Take 1 tablet by mouth daily.   pravastatin 10 MG tablet Commonly known as: PRAVACHOL Take 10 mg by mouth at bedtime.   PRESERVISION AREDS PO Take by mouth.   sodium bicarbonate 650 MG tablet Take 650 mg by mouth daily.   tamsulosin 0.4 MG Caps capsule Commonly known as: FLOMAX Take 0.4 mg by mouth daily.        Allergies:  Allergies  Allergen Reactions   Poison Oak Extract Hives and Itching   Sulfonamide Derivatives Itching and Rash   Sulfa Antibiotics     Family History: Family History  Problem Relation Age of Onset   Hypertension Father    Cancer Father        Prostate   Depression Mother    Hypertension Mother    Alcohol abuse Mother    COPD Mother        passed 07-08-17   Hypertension Sister    Anxiety disorder Sister    Alcohol abuse Maternal Grandfather    ADD / ADHD Neg Hx    Bipolar disorder Neg Hx    Dementia Neg Hx    Drug abuse Neg Hx    OCD Neg Hx    Paranoid behavior Neg Hx    Schizophrenia Neg Hx    Seizures Neg Hx    Sexual abuse Neg Hx    Physical abuse Neg Hx     Social History:  reports that he quit smoking about  46 years ago. His smoking use included cigarettes and cigars. He started smoking about 47 years ago. He has a 0.3 pack-year smoking history. He has been exposed to tobacco smoke. He has never used smokeless tobacco. He reports that he does not drink alcohol and does not use drugs.   Physical Exam: There were no vitals taken for this visit.  Constitutional:  Alert and oriented, No acute distress. HEENT: [place] AT, moist mucus membranes.  Trachea midline, no masses. Neurologic: Grossly intact, no focal deficits, moving all 4 extremities. Psychiatric: Normal mood and affect.   Assessment & Plan:    1. Hypotonic bladder - Urodynamics indicate delayed sensation in absence of contraction - His prostate does not appear to be particularly obstructive.  - It appears that he has been offered TURP based on his last discussion with Dr. Mena [name].  - He is not a candidate for Urolift due to the nature of the bladder dysfunction  2. Bladder cancer - Continue surveillance for recurrence   3. Urinary retention - Continue CIC 3-4 times daily - Consider alternative catheter types for comfort - Discussed abdominal voiding techniques to assist with bladder emptying - Discontinue medications that are not aiding in spontaneous voiding  Return in about 3 months (around 12/15/2023), or if symptoms worsen or fail to improve.   [facility] [address] [place], [place] [address] [phone]

## 2023-09-21 DIAGNOSIS — N1832 Chronic kidney disease, stage 3b: Secondary | ICD-10-CM | POA: Diagnosis not present

## 2023-09-21 DIAGNOSIS — Z23 Encounter for immunization: Secondary | ICD-10-CM | POA: Diagnosis not present

## 2023-09-21 DIAGNOSIS — R2689 Other abnormalities of gait and mobility: Secondary | ICD-10-CM | POA: Diagnosis not present

## 2023-09-21 DIAGNOSIS — Z8551 Personal history of malignant neoplasm of bladder: Secondary | ICD-10-CM | POA: Diagnosis not present

## 2023-09-21 DIAGNOSIS — F332 Major depressive disorder, recurrent severe without psychotic features: Secondary | ICD-10-CM | POA: Diagnosis not present

## 2023-09-21 DIAGNOSIS — G4733 Obstructive sleep apnea (adult) (pediatric): Secondary | ICD-10-CM | POA: Diagnosis not present

## 2023-09-21 DIAGNOSIS — R339 Retention of urine, unspecified: Secondary | ICD-10-CM | POA: Diagnosis not present

## 2023-09-21 DIAGNOSIS — I10 Essential (primary) hypertension: Secondary | ICD-10-CM | POA: Diagnosis not present

## 2023-09-21 DIAGNOSIS — F418 Other specified anxiety disorders: Secondary | ICD-10-CM | POA: Diagnosis not present

## 2023-09-21 DIAGNOSIS — S72142D Displaced intertrochanteric fracture of left femur, subsequent encounter for closed fracture with routine healing: Secondary | ICD-10-CM | POA: Diagnosis not present

## 2023-09-21 DIAGNOSIS — Z0001 Encounter for general adult medical examination with abnormal findings: Secondary | ICD-10-CM | POA: Diagnosis not present

## 2023-09-21 DIAGNOSIS — I129 Hypertensive chronic kidney disease with stage 1 through stage 4 chronic kidney disease, or unspecified chronic kidney disease: Secondary | ICD-10-CM | POA: Diagnosis not present

## 2023-09-21 DIAGNOSIS — Z87442 Personal history of urinary calculi: Secondary | ICD-10-CM | POA: Diagnosis not present

## 2023-09-22 ENCOUNTER — Telehealth (HOSPITAL_COMMUNITY): Payer: Self-pay | Admitting: *Deleted

## 2023-09-22 ENCOUNTER — Ambulatory Visit (HOSPITAL_COMMUNITY)
Admission: EM | Admit: 2023-09-22 | Discharge: 2023-09-22 | Disposition: A | Discharge status: Home / Self Care | Payer: PPO | Attending: Psychiatry | Admitting: Psychiatry

## 2023-09-22 DIAGNOSIS — F419 Anxiety disorder, unspecified: Secondary | ICD-10-CM | POA: Diagnosis not present

## 2023-09-22 DIAGNOSIS — R5383 Other fatigue: Secondary | ICD-10-CM | POA: Insufficient documentation

## 2023-09-22 DIAGNOSIS — R4589 Other symptoms and signs involving emotional state: Secondary | ICD-10-CM

## 2023-09-22 DIAGNOSIS — F32A Depression, unspecified: Secondary | ICD-10-CM | POA: Insufficient documentation

## 2023-09-22 NOTE — Discharge Instructions (Signed)
F/u with PCP F/u with outpatient resources

## 2023-09-22 NOTE — Telephone Encounter (Signed)
Called pt no answer left vm

## 2023-09-22 NOTE — ED Provider Notes (Signed)
[facility]al [facility] Urgent [facility] Medical Screening Exam  Patient Name: Zachary [name] MRN: [id number] Date of Evaluation: 09/22/23 Chief Complaint:  decrease energy level Diagnosis:  Final diagnoses:  Anxious appearance  Depressed affect  Always tired  Low energy    History of Present illness: Zachary [name] is a 77 y.o. male. With a history of depression present to [facility] with his wife.  Per the patient he do not want to do anything during the day and he sleeps until 5 PM, he gets up out of bed and take 2 Tylenol and then he goes and sits in his chair and sleep again.  According to the patient his energy level is very low and he does not feel like doing anything most days.  According to patient's wife he did see his PCP a couple days ago and they did not do anything but his labs were okay.  Patient also stated that he is seeing a psychiatrist who gave him some Prozac and it has been 4 weeks and is not doing anything.  Writer discussed with patient that the Prozac will take roughly between 6 to 8 weeks to be therapeutic.  Patient's wife is very frustrated and stated that she had called up here and the lady who she spoke to on the phone told her she could come here we would help her husband.  Writer discussed with patient and his wife that he needs to reach out to his primary care provider so that they can do a workup to see what is causing his energy level to be low.  Also given patient's age patient is also uses a walker for ambulation patient had his leg broken in February of this year patient also has bladder issues and is currently self catheterizing daily.  Writer discussed with wife and patient that they need to reach out to their primary care physician because this is not a psych issue it can be multiple things such as thyroid level or his B12 level is low and therefore primary care is best to handle this situation.  Triage notes: Zachary [name]- presents to [facility] voluntarily, accompanied by his wife with  complaint of depression and lack of energy. Pt reports sleeping very minimal at night, but sleeping a bit more during the day. Per wife, pt does not want to do anything and refuses to get out and do what he normally does. Pt reports that over the last month or so he has not had the desire to do anything and barely eats. Pt is linked to psychiatrist Dr. Diannia Ruder and was prescribed Prozac about a month ago. Pt reports he is compliant. Pt denies SI,HI,AVH and substance/alcohol use.  Face-to-face observation of patient, patient is alert and oriented x 4, speech is clear, patient able to answer all questions appropriately.  According to the patient his energy is very low and he does not feel his best self since he broke his leg in February and he is frustrated that he has to be bladder catheterizing himself every day to pee.  Writer did reassure patient that he needs to have a conversation with his primary care provider.  Patient denies SI, HI, AVH or paranoia.  Patient denies alcohol use denies smoking or any illicit drug use.  Patient does not seem to be influenced by external or internal stimuli does not appear to have any psychiatric issues. Writer did discuss with patient and his wife that they should have a conversation with the med provider about  the Prozac however for now she should continue the Prozac because it does take a while to be therapeutic.     Recommend discharge for pt to f/u with PCP  Flowsheet Row ED from 09/22/2023 in [facility] Office Visit from 07/26/2023 in [facility] ED from 07/05/2023 in [facility] Emergency Department at [facility]  C-SSRS RISK CATEGORY No Risk No Risk No Risk       Psychiatric Specialty Exam  Presentation  General Appearance:Casual  Eye Contact:Good  Speech:Clear and Coherent  Speech Volume:Normal  Handedness:Right   Mood and Affect   Mood: Anxious  Affect: Appropriate   Thought Process  Thought Processes: Coherent  Descriptions of Associations:Intact  Orientation:Full (Time, Place and Person)  Thought Content:WDL    Hallucinations:None  Ideas of Reference:None  Suicidal Thoughts:No  Homicidal Thoughts:No   Sensorium  Memory: Immediate Fair  Judgment: Fair  Insight: Fair   Art therapist  Concentration: Fair  Attention Span: Good  Recall: Good  Fund of Knowledge: Good  Language: Good   Psychomotor Activity  Psychomotor Activity: Normal   Assets  Assets: Desire for Improvement   Sleep  Sleep: Fair  Number of hours:  8   Physical Exam: Physical Exam HENT:     Head: Normocephalic.     Nose: Nose normal.  Eyes:     Pupils: Pupils are equal, round, and reactive to light.  Cardiovascular:     Rate and Rhythm: Normal rate.  Pulmonary:     Effort: Pulmonary effort is normal.  Musculoskeletal:        General: Normal range of motion.     Cervical back: Normal range of motion.  Neurological:     General: No focal deficit present.     Mental Status: He is alert.  Psychiatric:        Mood and Affect: Mood normal.    Review of Systems  Constitutional: Negative.   HENT: Negative.    Eyes: Negative.   Respiratory: Negative.    Cardiovascular: Negative.   Gastrointestinal: Negative.   Genitourinary: Negative.   Musculoskeletal: Negative.   Skin: Negative.   Neurological: Negative.   Psychiatric/Behavioral:  The patient is nervous/anxious.    Blood pressure (!) 140/86, pulse 78, temperature 98.1 F (36.7 C), temperature source Oral, resp. rate 18, SpO2 99%. There is no height or weight on file to calculate BMI.  Musculoskeletal: Strength & Muscle Tone: within normal limits Gait & Station: normal Patient leans: N/A   [facility] MSE Discharge Disposition for Follow up and Recommendations: Based on my evaluation the patient does not appear to have an  emergency medical condition and can be discharged with resources and follow up care in outpatient services for Individual Therapy   Sindy Guadeloupe, NP 09/22/2023, 9:16 PM

## 2023-09-22 NOTE — Progress Notes (Signed)
   09/22/23 1957  [facility] Triage Screening (Walk-ins at [facility] only)  How Did You Hear About [place]? Family/Friend  What Is the Reason for Your Visit/Call Today? Pt presents to [facility] voluntarily, accompanied by his wife with complaint of depression and lack of energy. Pt reports sleeping very minimal at night, but sleeping a bit more during the day. Per wife, pt does not want to do anything and refuses to get out and do what he normally does. Pt reports that over the last month or so he has not had the desire to do anything and barely eats. Pt is linked to psychiatrist Dr. Diannia Ruder and was prescribed Prozac about a month ago. Pt reports he is compliant. Pt denies SI,HI,AVH and substance/alcohol use.  How Long Has This Been Causing You Problems? 1 wk - 1 month  Have You Recently Had Any Thoughts About Hurting Yourself? No  Are You Planning to Commit Suicide/Harm Yourself At This time? No  Have you Recently Had Thoughts About Hurting Someone Karolee Ohs? No  Are You Planning To Harm Someone At This Time? No  Are you currently experiencing any auditory, visual or other hallucinations? No  Have You Used Any Alcohol or Drugs in the Past 24 Hours? No  Do you have any current medical co-morbidities that require immediate attention? No  Clinician description of patient physical appearance/behavior: pt is calm, cooperative  What Do You Feel Would Help You the Most Today? Treatment for Depression or other mood problem  If access to [facility] was not available, would you have sought care in the Emergency Department? No  Determination of Need Routine (7 days)  Options For Referral Other: Comment;Medication Management;Outpatient Therapy

## 2023-09-22 NOTE — Telephone Encounter (Signed)
Patients wife called stated that they saw Dr hall Today & they shared with him the Prozac recently prescribed isn't working.  Dr hall suggested they get in touch with Dr Tenny Craw to see what she recommends.  I'm reaching to Admin Staff to schedule a f/u appt

## 2023-09-22 NOTE — ED Notes (Signed)
Pt was given OP resources his wife was verbally aggressive stating we would not help her husband and she brought him here for no reason wife stated she talked to someone and it was stated that they would help her I stated that is why they gave him the resources and they would give medication management and therapy for his depression she did not care to hear that abs she snatched the d/c papers out of  my hand

## 2023-09-24 NOTE — Telephone Encounter (Signed)
Appt scheduled 10/01/23

## 2023-09-27 ENCOUNTER — Encounter (INDEPENDENT_AMBULATORY_CARE_PROVIDER_SITE_OTHER): Payer: Self-pay | Admitting: *Deleted

## 2023-09-28 DIAGNOSIS — S72142D Displaced intertrochanteric fracture of left femur, subsequent encounter for closed fracture with routine healing: Secondary | ICD-10-CM | POA: Diagnosis not present

## 2023-09-29 ENCOUNTER — Ambulatory Visit: Payer: PPO

## 2023-09-29 ENCOUNTER — Ambulatory Visit
Admission: EM | Admit: 2023-09-29 | Discharge: 2023-09-29 | Disposition: A | Discharge status: Home / Self Care | Payer: PPO

## 2023-09-29 DIAGNOSIS — M25531 Pain in right wrist: Secondary | ICD-10-CM | POA: Diagnosis not present

## 2023-09-29 DIAGNOSIS — M79631 Pain in right forearm: Secondary | ICD-10-CM | POA: Diagnosis not present

## 2023-09-29 DIAGNOSIS — M1811 Unilateral primary osteoarthritis of first carpometacarpal joint, right hand: Secondary | ICD-10-CM | POA: Diagnosis not present

## 2023-09-29 DIAGNOSIS — M19041 Primary osteoarthritis, right hand: Secondary | ICD-10-CM | POA: Diagnosis not present

## 2023-09-29 NOTE — ED Triage Notes (Addendum)
Pt c/o right wrist pain and swelling due to injury after fall in the garage last night. Pt states he fell up against the car he was off balance

## 2023-09-29 NOTE — Discharge Instructions (Addendum)
The x-ray did not show any new fracture or dislocation.  There is an old fracture seen on the x-ray consistent with your past history. Wear the wrist brace to provide compression and support.  Wear the brace when you are engaged in prolonged or strenuous activity. Continue your current medications. Continue RICE therapy.  Rest, ice, compression, and elevation.  Apply ice for 20 minutes, remove for 1 hour, repeat as needed. Gentle range of motion exercises of the wrist while symptoms persist. If symptoms do not improve over the next 2 to 3 weeks, or appear to be worsening before that time, please follow-up with your orthopedist for further evaluation. Follow-up as needed.

## 2023-09-29 NOTE — ED Provider Notes (Signed)
[facility]    CSN: [id number] Arrival date & time: 09/29/23  1802      History   Chief Complaint No chief complaint on file.   HPI Zachary [name] is a 77 y.o. male.   The history is provided by the patient.   Patient presents for complaints of right wrist pain after a fall last evening.  Patient states he became imbalanced, and fell against his car.  Patient is currently using a walker, states that the walker went behind him.  Since the fall, patient complains of pain in the top of the right wrist.  He also endorses swelling and decreased range of motion.  Patient reports that he has a history of right forearm surgery and carpal tunnel surgery.  Patient reports he is right-hand dominant.  Patient has been taking hydrocodone for his symptoms, also reports that he has applied ice to the affected area.  Past Medical History:  Diagnosis Date   Anxiety    takes Valium as needed   Arthritis    Bladder cancer (HCC)    takes Rapaflo daily   Blood dyscrasia    07/08/16: pt told he was a "free bleeder" after bleeding a lot when derm cut off mole on forehead. No excessive bleeding with minor wounds at home, no bleeding problems perioperatively with prior surgeries.   Chronic back pain    spondylolisthesis   Cluster headaches    Depression    takes Lexapro daily   Essential hypertension, benign    takes Diovan-HCT daily   Hearing loss    hearing aids   History of kidney stones    Hx of complications due to general anesthesia    Aborted surgery 07/15/2016 due to hypotension per pt   Joint pain    Kidney stone 03/2019   passed stone   Obstructive sleep apnea on CPAP    uses CPAP @ night   Parkinsonism (HCC)    Pneumonia    "several times"--last time about 3-4 yrs ago   Pre-diabetes    Prediabetes    Syncopal episodes    Thyroid condition     Patient Active Problem List   Diagnosis Date Noted   Intertrochanteric fracture of left femur (HCC) 02/09/2023   Left  ureteral stone 02/12/2020   Subclinical hyperthyroidism 09/20/2018   Prediabetes 09/20/2018   S/P total knee replacement, 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 02/07/2018   Primary osteoarthritis of left knee 02/01/2018   Postural hypotension 07/23/2016   Dysphagia 07/23/2016   Obstructive sleep apnea 07/21/2016   Hyperglycemia 07/21/2016   Anemia, unspecified 07/21/2016   Spinal stenosis of lumbar region 07/15/2016   Bladder tumor 07/27/2015   Epiretinal membrane (ERM) of left eye 03/14/2014   H/O retinal detachment 03/14/2014   Pseudophakia of both eyes 03/14/2014   Retinal detachment 03/14/2014   Visual distortion 03/14/2014   HNP (herniated nucleus pulposus), cervical 06/05/2013    Class: Diagnosis of   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   Muscle spasms of neck 04/04/2013   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 anxiety 03/01/2013   OA (osteoarthritis) of knee 02/22/2013   Iliotibial band syndrome of left side 11/22/2012   Localized, primary osteoarthritis of the ankle and foot 10/11/2012   Difficulty walking 08/17/2012   Peroneal tendinitis 08/16/2012   Precordial pain 02/09/2012   Essential hypertension, benign 02/09/2012   Mixed hyperlipidemia 02/09/2012   Dysthymia 02/04/2012   Abnormality of gait 12/23/2011   Lateral epicondylitis/tennis elbow 05/06/2011   TENOSYNOVITIS OF FOOT AND ANKLE 10/22/2010  Past Surgical History:  Procedure Laterality Date   ANTERIOR CERVICAL DECOMP/DISCECTOMY FUSION N/A 06/05/2013   Procedure:  C3-4 Anterior Cervical Discectomy and Fusion, Allograft, Plate;  Surgeon: Eldred Manges, MD;  Location: [facility] OR;  Service: Orthopedics;  Laterality: N/A;  C3-4 Anterior Cervical Discectomy and Fusion, Allograft, Plate   Arthroscopic knee surgery     BACK SURGERY      x 2   BLADDER SURGERY     x 5 to remove tumor   CARPAL TUNNEL RELEASE     CATARACT EXTRACTION W/PHACO  12/19/2012   Procedure: CATARACT EXTRACTION PHACO AND INTRAOCULAR LENS PLACEMENT (IOC);  Surgeon: Gemma Payor, MD;   Location: [facility]S;  Service: Ophthalmology;  Laterality: Left;  CDE: 26.80   CATARACT EXTRACTION W/PHACO  01/09/2013   Procedure: CATARACT EXTRACTION PHACO AND INTRAOCULAR LENS PLACEMENT (IOC);  Surgeon: Gemma Payor, MD;  Location: [facility];  Service: Ophthalmology;  Laterality: Right;  CDE:22.83   CERVICAL FUSION  06/05/2013   C 3  C4   CYSTOSCOPY     CYSTOSCOPY W/ URETERAL STENT PLACEMENT Bilateral 02/12/2020   Procedure: CYSTOSCOPY WITH RETROGRADE PYELOGRAM/URETERAL STENT PLACEMENT;  Surgeon: Bjorn Pippin, MD;  Location: [facility];  Service: Urology;  Laterality: Bilateral;   INTRAMEDULLARY (IM) NAIL INTERTROCHANTERIC Left 02/10/2023   Procedure: LEFT INTRAMEDULLARY (IM) NAILING OF LEFT FEMUR;  Surgeon: Roby Lofts, MD;  Location: [facility] OR;  Service: Orthopedics;  Laterality: Left;   LITHOTRIPSY  02/2020   REFRACTIVE SURGERY     Hx: of   RETINAL DETACHMENT SURGERY     TOTAL KNEE ARTHROPLASTY Left 02/01/2018   Procedure: TOTAL KNEE ARTHROPLASTY;  Surgeon: Vickki Hearing, MD;  Location: [facility];  Service: Orthopedics;  Laterality: Left;   TRANSURETHRAL RESECTION OF BLADDER TUMOR Right 07/27/2015   Procedure: TRANSURETHRAL RESECTION OF BLADDER TUMOR (TURBT);  Surgeon: Jethro Bolus, MD;  Location: [facility];  Service: Urology;  Laterality: Right;   TRANSURETHRAL RESECTION OF BLADDER TUMOR WITH MITOMYCIN-C N/A 01/25/2023   Procedure: TRANSURETHRAL RESECTION OF BLADDER TUMOR WITH GEMCITABINE;  Surgeon: Crista Elliot, MD;  Location: [facility];  Service: Urology;  Laterality: N/A;   wisdom tooth extraction         Home Medications    Prior to Admission medications   Medication Sig Start Date End Date Taking? Authorizing Provider  HYDROcodone-acetaminophen (NORCO/VICODIN) 5-325 MG tablet Take 1 tablet by mouth 3 (three) times daily as needed. 09/21/23  Yes [provider]  acetaminophen (TYLENOL) 500 MG tablet Take 1,000 mg by mouth at bedtime as needed for moderate pain.    [provider]  bethanechol (URECHOLINE) 25 MG tablet Take 25 mg by mouth 3 (three) times daily. 08/18/23   [provider]  carvedilol (COREG) 3.125 MG tablet Take 3.125 mg by mouth 2 (two) times daily.    [provider]  dorzolamide-timolol (COSOPT) 2-0.5 % ophthalmic solution Place 1 drop into both eyes 2 (two) times daily. 01/02/23   [provider]  FLUoxetine (PROZAC) 20 MG capsule Take 1 capsule (20 mg total) by mouth every morning. 08/23/23 08/22/24  Myrlene Broker, MD  latanoprost (XALATAN) 0.005 % ophthalmic solution Place 1 drop into both eyes at bedtime.    [provider]  Multiple Vitamin (MULTIVITAMIN) tablet Take 1 tablet by mouth daily.    [provider]  Multiple Vitamins-Minerals (PRESERVISION AREDS PO) Take by mouth.    [provider]  pravastatin (PRAVACHOL) 10 MG tablet Take 10 mg by  mouth at bedtime. 01/23/20   [provider]  sodium bicarbonate 650 MG tablet Take 650 mg by mouth daily.    [provider]  tamsulosin (FLOMAX) 0.4 MG CAPS capsule Take 0.4 mg by mouth daily.    [provider]    Family History Family History  Problem Relation Age of Onset   Hypertension Father    Cancer Father        Prostate   Depression Mother    Hypertension Mother    Alcohol abuse Mother    COPD Mother        passed 07-08-17   Hypertension Sister    Anxiety disorder Sister    Alcohol abuse Maternal Grandfather    ADD / ADHD Neg Hx    Bipolar disorder Neg Hx    Dementia Neg Hx    Drug abuse Neg Hx    OCD Neg Hx    Paranoid behavior Neg Hx    Schizophrenia Neg Hx    Seizures Neg Hx    Sexual abuse Neg Hx    Physical abuse Neg Hx     Social History Social History   Tobacco Use   Smoking status: Former    Current packs/day: 0.00    Average packs/day: 0.3 packs/day for 1 year (0.3 ttl pk-yrs)    Types: Cigarettes, Cigars    Start date: 12/31/1975    Quit date: 12/30/1976    Years since  quitting: 46.7    Passive exposure: Past   Smokeless tobacco: Never   Tobacco comments:    1985  Vaping Use   Vaping status: Never Used  Substance Use Topics   Alcohol use: No    Comment: quit 1990   Drug use: No     Allergies   Poison oak extract, Sulfonamide derivatives, and Sulfa antibiotics   Review of Systems Review of Systems Per HPI  Physical Exam Triage Vital Signs ED Triage Vitals  Encounter Vitals Group     BP 09/29/23 1832 (!) 179/95     Systolic BP Percentile --      Diastolic BP Percentile --      Pulse Rate 09/29/23 1832 100     Resp 09/29/23 1832 18     Temp 09/29/23 1832 98.3 F (36.8 C)     Temp Source 09/29/23 1832 Oral     SpO2 09/29/23 1832 95 %     Weight --      Height --      Head Circumference --      Peak Flow --      Pain Score 09/29/23 1834 4     Pain Loc --      Pain Education --      Exclude from Growth Chart --    No data found.  Updated Vital Signs BP (!) 179/95 (BP Location: Right Arm)   Pulse 100   Temp 98.3 F (36.8 C) (Oral)   Resp 18   SpO2 95%   Visual Acuity Right Eye Distance:   Left Eye Distance:   Bilateral Distance:    Right Eye Near:   Left Eye Near:    Bilateral Near:     Physical Exam Vitals and nursing note reviewed.  Constitutional:      General: He is not in acute distress.    Appearance: Normal appearance.  HENT:     Head: Normocephalic.  Eyes:     Extraocular Movements: Extraocular movements intact.     Pupils: Pupils are  equal, round, and reactive to light.  Pulmonary:     Effort: Pulmonary effort is normal.  Musculoskeletal:     Right wrist: Swelling (dorsal aspect, and to styloid bone of right wrist) and tenderness (dorsal aspect) present. No deformity, effusion or snuff box tenderness. Decreased range of motion. Normal pulse.  Skin:    General: Skin is warm and dry.     Findings: Abrasion (Lateral aspect of right wrist.  There is no oozing, or drainage present.) present.   Neurological:     General: No focal deficit present.     Mental Status: He is alert and oriented to person, place, and time.  Psychiatric:        Mood and Affect: Mood normal.        Behavior: Behavior normal.      UC Treatments / Results  Labs (all labs ordered are listed, but only abnormal results are displayed) Labs Reviewed - No data to display  EKG   Radiology DG Wrist Complete Right  Result Date: 09/29/2023 CLINICAL DATA:  Right wrist pain in the ulnar side after a fall today. EXAM: RIGHT WRIST - COMPLETE 3+ VIEW COMPARISON:  None Available. FINDINGS: Diffuse bone demineralization. Degenerative changes in the metacarpal phalangeal, first carpometacarpal, STT, and radiocarpal joints. Old ununited ossicle in the dorsum of the wrist. Old ununited ulnar styloid process fragment. Probable old fracture deformity of the distal radius. No evidence of acute fracture or dislocation. No focal bone lesion or bone destruction. Vascular calcifications. IMPRESSION: Severe degenerative changes in the right hand and wrist. Old fracture deformities. No acute displaced fractures are identified. Electronically Signed   By: Burman Nieves M.D.   On: 09/29/2023 19:42    Procedures Procedures (including critical care time)  Medications Ordered in UC Medications - No data to display  Initial Impression / Assessment and Plan / UC Course  I have reviewed the triage vital signs and the nursing notes.  Pertinent labs & imaging results that were available during my care of the patient were reviewed by me and considered in my medical decision making (see chart for details).  The patient is well-appearing, he is in no acute distress, vital signs are stable.  X-ray is negative for any acute fracture or dislocation.  Patient does have an old fracture seen, consistent with his history.  Wrist brace was provided to the patient to provide compression and support.  Symptoms appear to be consistent with a  wrist contusion or sprain.  Supportive care recommendations were provided and discussed with the patient to include continuing pain medication previously prescribed, and RICE therapy.  Patient was advised if symptoms do not improve over the next 2 to 3 weeks, recommend that he follow-up with orthopedics for further evaluation.  Patient was in agreement with this plan of care and verbalizes understanding.  All questions were answered.  Patient stable for discharge.   Final Clinical Impressions(s) / UC Diagnoses   Final diagnoses:  Right wrist pain     Discharge Instructions      The x-ray did not show any new fracture or dislocation.  There is an old fracture seen on the x-ray consistent with your past history. Wear the wrist brace to provide compression and support.  Wear the brace when you are engaged in prolonged or strenuous activity. Continue your current medications. Continue RICE therapy.  Rest, ice, compression, and elevation.  Apply ice for 20 minutes, remove for 1 hour, repeat as needed. Gentle range of motion exercises  of the wrist while symptoms persist. If symptoms do not improve over the next 2 to 3 weeks, or appear to be worsening before that time, please follow-up with your orthopedist for further evaluation. Follow-up as needed.     ED Prescriptions   None    PDMP not reviewed this encounter.   Abran Cantor, NP 09/29/23 2010

## 2023-10-01 ENCOUNTER — Encounter (HOSPITAL_COMMUNITY): Payer: Self-pay | Admitting: Psychiatry

## 2023-10-01 ENCOUNTER — Telehealth (INDEPENDENT_AMBULATORY_CARE_PROVIDER_SITE_OTHER): Payer: PPO | Admitting: Psychiatry

## 2023-10-01 DIAGNOSIS — F331 Major depressive disorder, recurrent, moderate: Secondary | ICD-10-CM | POA: Diagnosis not present

## 2023-10-01 DIAGNOSIS — H30042 Focal chorioretinal inflammation, macular or paramacular, left eye: Secondary | ICD-10-CM | POA: Diagnosis not present

## 2023-10-01 DIAGNOSIS — H35372 Puckering of macula, left eye: Secondary | ICD-10-CM | POA: Diagnosis not present

## 2023-10-01 DIAGNOSIS — H31092 Other chorioretinal scars, left eye: Secondary | ICD-10-CM | POA: Diagnosis not present

## 2023-10-01 DIAGNOSIS — H35352 Cystoid macular degeneration, left eye: Secondary | ICD-10-CM | POA: Diagnosis not present

## 2023-10-01 MED ORDER — DULOXETINE HCL 60 MG PO CPEP: 60.0000 mg | ORAL_CAPSULE | Freq: Two times a day (BID) | ORAL | 3 refills | Status: DC

## 2023-10-01 NOTE — Progress Notes (Signed)
Virtual Visit via Video Note  I connected with Zachary [name] on 10/01/23 at  9:40 AM EDT by a video enabled telemedicine application and verified that I am speaking with the correct person using two identifiers.  Location: Patient: home Provider: office   I discussed the limitations of evaluation and management by telemedicine and the availability of in person appointments. The patient expressed understanding and agreed to proceed.     I discussed the assessment and treatment plan with the patient. The patient was provided an opportunity to ask questions and all were answered. The patient agreed with the plan and demonstrated an understanding of the instructions.   The patient was advised to call back or seek an in-person evaluation if the symptoms worsen or if the condition fails to improve as anticipated.  I provided 20 minutes of non-face-to-face time during this encounter.   Diannia Ruder, MD  [facility] MD/PA/NP OP Progress Note  10/01/2023 10:02 AM Zachary [name]  MRN:  [id number]  Chief Complaint:  Chief Complaint  Patient presents with   Depression   Anxiety   Follow-up   HPI: This patient is a 77 year old married white male who lives with his wife in [place].  They have no children.  He is retired from the Scientist, product/process development.  The patient returns for follow-up after about 5 weeks.  Last time we tried to change him from duloxetine to fluoxetine.  He claims he has been taking both together.  I explained that this is not a good idea in this could have significant side effects and we have to pick 1 or the other.  He is feeling a little bit better on the increased dose of antidepressant.  He would rather stick with this duloxetine and I explained that we could increase it and dropped the fluoxetine.  I also explained this to his wife.  The patient states that he has no energy.  He sees Dr. Dwana Melena who is not in [facility] and I cannot access his medical records.  Apparently however  he has had a good workup to make sure there are not any organic causes of the low energy.  He has been trying to get out more and he went to church the other night and fell and Roundy his right wrist although it is not broken.  He is sleeping well.  His appetite is okay.  His wife states that he is "listless."  He is also having problems with short-term memory and he has been referred to some sort of Alzheimer's assessment in [place] which the wife does not recall the name of.  When asked about suicidal ideation he claims that he has had this in the recent past but not today.  He does not have any thoughts of self-harm or suicide.  He does own firearms and I asked that the wife remove him from the home as he is at risk given his age and episodes of memory loss.  She states that she will do so. Visit Diagnosis:    ICD-10-CM   1. Major depressive disorder, recurrent episode, moderate (HCC)  F33.1       Past Psychiatric History: Long-term outpatient treatment  Past Medical History:  Past Medical History:  Diagnosis Date   Anxiety    takes Valium as needed   Arthritis    Bladder cancer (HCC)    takes Rapaflo daily   Blood dyscrasia    07/08/16: pt told he was a "free bleeder" after bleeding  a lot when derm cut off mole on forehead. No excessive bleeding with minor wounds at home, no bleeding problems perioperatively with prior surgeries.   Chronic back pain    spondylolisthesis   Cluster headaches    Depression    takes Lexapro daily   Essential hypertension, benign    takes Diovan-HCT daily   Hearing loss    hearing aids   History of kidney stones    Hx of complications due to general anesthesia    Aborted surgery 07/15/2016 due to hypotension per pt   Joint pain    Kidney stone 03/2019   passed stone   Obstructive sleep apnea on CPAP    uses CPAP @ night   Parkinsonism (HCC)    Pneumonia    "several times"--last time about 3-4 yrs ago   Pre-diabetes    Prediabetes    Syncopal  episodes    Thyroid condition     Past Surgical History:  Procedure Laterality Date   ANTERIOR CERVICAL DECOMP/DISCECTOMY FUSION N/A 06/05/2013   Procedure:  C3-4 Anterior Cervical Discectomy and Fusion, Allograft, Plate;  Surgeon: Eldred Manges, MD;  Location: [facility] OR;  Service: Orthopedics;  Laterality: N/A;  C3-4 Anterior Cervical Discectomy and Fusion, Allograft, Plate   Arthroscopic knee surgery     BACK SURGERY      x 2   BLADDER SURGERY     x 5 to remove tumor   CARPAL TUNNEL RELEASE     CATARACT EXTRACTION W/PHACO  12/19/2012   Procedure: CATARACT EXTRACTION PHACO AND INTRAOCULAR LENS PLACEMENT (IOC);  Surgeon: Gemma Payor, MD;  Location: [facility];  Service: Ophthalmology;  Laterality: Left;  CDE: 26.80   CATARACT EXTRACTION W/PHACO  01/09/2013   Procedure: CATARACT EXTRACTION PHACO AND INTRAOCULAR LENS PLACEMENT (IOC);  Surgeon: Gemma Payor, MD;  Location: [facility];  Service: Ophthalmology;  Laterality: Right;  CDE:22.83   CERVICAL FUSION  06/05/2013   C 3  C4   CYSTOSCOPY     CYSTOSCOPY W/ URETERAL STENT PLACEMENT Bilateral 02/12/2020   Procedure: CYSTOSCOPY WITH RETROGRADE PYELOGRAM/URETERAL STENT PLACEMENT;  Surgeon: Bjorn Pippin, MD;  Location: [facility];  Service: Urology;  Laterality: Bilateral;   INTRAMEDULLARY (IM) NAIL INTERTROCHANTERIC Left 02/10/2023   Procedure: LEFT INTRAMEDULLARY (IM) NAILING OF LEFT FEMUR;  Surgeon: Roby Lofts, MD;  Location: [facility] OR;  Service: Orthopedics;  Laterality: Left;   LITHOTRIPSY  02/2020   REFRACTIVE SURGERY     Hx: of   RETINAL DETACHMENT SURGERY     TOTAL KNEE ARTHROPLASTY Left 02/01/2018   Procedure: TOTAL KNEE ARTHROPLASTY;  Surgeon: Vickki Hearing, MD;  Location: [facility];  Service: Orthopedics;  Laterality: Left;   TRANSURETHRAL RESECTION OF BLADDER TUMOR Right 07/27/2015   Procedure: TRANSURETHRAL RESECTION OF BLADDER TUMOR (TURBT);  Surgeon: Jethro Bolus, MD;  Location: [facility];  Service: Urology;  Laterality: Right;   TRANSURETHRAL  RESECTION OF BLADDER TUMOR WITH MITOMYCIN-C N/A 01/25/2023   Procedure: TRANSURETHRAL RESECTION OF BLADDER TUMOR WITH GEMCITABINE;  Surgeon: Crista Elliot, MD;  Location: [facility];  Service: Urology;  Laterality: N/A;   wisdom tooth extraction      Family Psychiatric History: See below  Family History:  Family History  Problem Relation Age of Onset   Hypertension Father    Cancer Father        Prostate   Depression Mother    Hypertension Mother    Alcohol abuse Mother    COPD Mother  passed 07-08-17   Hypertension Sister    Anxiety disorder Sister    Alcohol abuse Maternal Grandfather    ADD / ADHD Neg Hx    Bipolar disorder Neg Hx    Dementia Neg Hx    Drug abuse Neg Hx    OCD Neg Hx    Paranoid behavior Neg Hx    Schizophrenia Neg Hx    Seizures Neg Hx    Sexual abuse Neg Hx    Physical abuse Neg Hx     Social History:  Social History   Socioeconomic History   Marital status: Married    Spouse name: Garment/textile technologist    Number of children: 0   Years of education: 13   Highest education level: Not on file  Occupational History   Occupation: Retired    Comment: med [employer]    Employer: UNEMPLOYED  Tobacco Use   Smoking status: Former    Current packs/day: 0.00    Average packs/day: 0.3 packs/day for 1 year (0.3 ttl pk-yrs)    Types: Cigarettes, Cigars    Start date: 12/31/1975    Quit date: 12/30/1976    Years since quitting: 46.7    Passive exposure: Past   Smokeless tobacco: Never   Tobacco comments:    1985  Vaping Use   Vaping status: Never Used  Substance and Sexual Activity   Alcohol use: No    Comment: quit 1990   Drug use: No   Sexual activity: Yes  Other Topics Concern   Not on file  Social History Narrative   Lives with wife    caffeine- sodas   Right handed   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    Social Determinants of Health   Financial Resource Strain: Not on file  Food Insecurity: No Food Insecurity (02/10/2023)    Hunger Vital Sign    Worried About Running Out of Food in the Last Year: Never true    Ran Out of Food in the Last Year: Never true  Transportation Needs: No Transportation Needs (02/10/2023)   PRAPARE - Administrator, Civil Service (Medical): No    Lack of Transportation (Non-Medical): No  Physical Activity: Not on file  Stress: Not on file  Social Connections: Not on file    Allergies:  Allergies  Allergen Reactions   Poison Oak Extract Hives and Itching   Sulfonamide Derivatives Itching and Rash   Sulfa Antibiotics     Metabolic Disorder Labs: Lab Results  Component Value Date   HGBA1C 6.3 12/01/2022   MPG 120 07/22/2016   No results found for: "PROLACTIN" No results found for: "CHOL", "TRIG", "HDL", "CHOLHDL", "VLDL", "LDLCALC" Lab Results  Component Value Date   TSH 2.701 02/03/2021    Therapeutic Level Labs: No results found for: "LITHIUM" No results found for: "VALPROATE" Lab Results  Component Value Date   CBMZ 8.2 04/10/2013    Current Medications: Current Outpatient Medications  Medication Sig Dispense Refill   acetaminophen (TYLENOL) 500 MG tablet Take 1,000 mg by mouth at bedtime as needed for moderate pain.     bethanechol (URECHOLINE) 25 MG tablet Take 25 mg by mouth 3 (three) times daily.     carvedilol (COREG) 3.125 MG tablet Take 3.125 mg by mouth 2 (two) times daily.     dorzolamide-timolol (COSOPT) 2-0.5 % ophthalmic solution Place 1 drop into both eyes 2 (two) times daily.     DULoxetine (CYMBALTA) 60 MG capsule Take 1 capsule (  60 mg total) by mouth 2 (two) times daily. TAKE 1 CAPSULE(60 MG) BY MOUTH DAILY 60 capsule 3   HYDROcodone-acetaminophen (NORCO/VICODIN) 5-325 MG tablet Take 1 tablet by mouth 3 (three) times daily as needed.     latanoprost (XALATAN) 0.005 % ophthalmic solution Place 1 drop into both eyes at bedtime.     Multiple Vitamin (MULTIVITAMIN) tablet Take 1 tablet by mouth daily.     Multiple Vitamins-Minerals  (PRESERVISION AREDS PO) Take by mouth.     pravastatin (PRAVACHOL) 10 MG tablet Take 10 mg by mouth at bedtime.     sodium bicarbonate 650 MG tablet Take 650 mg by mouth daily.     tamsulosin (FLOMAX) 0.4 MG CAPS capsule Take 0.4 mg by mouth daily.     No current facility-administered medications for this visit.     Musculoskeletal: Strength & Muscle Tone: decreased Gait & Station: unsteady Patient leans: N/A  Psychiatric Specialty Exam: Review of Systems  Constitutional:  Positive for fatigue.  Musculoskeletal:  Positive for arthralgias, back pain and gait problem.  Psychiatric/Behavioral:  Positive for dysphoric mood.     There were no vitals taken for this visit.There is no height or weight on file to calculate BMI.  General Appearance: Casual and Fairly Groomed  Eye Contact:  Good  Speech:  Clear and Coherent  Volume:  Normal  Mood:  Dysphoric  Affect:  Flat  Thought Process:  Goal Directed  Orientation:  Full (Time, Place, and Person)  Thought Content: Rumination   Suicidal Thoughts:  No  Homicidal Thoughts:  No  Memory:  Immediate;   Poor Recent;   Poor Remote;   Good  Judgement:  Impaired  Insight:  Shallow  Psychomotor Activity:  Decreased  Concentration:  Concentration: Fair and Attention Span: Fair  Recall:  [employer] of Knowledge: Fair  Language: Good  Akathisia:  No  Handed:  Right  AIMS (if indicated): not done  Assets:  Communication Skills Desire for Improvement Resilience Social Support  ADL's:  Intact  Cognition: Impaired,  Mild  Sleep:  Good   Screenings: CAGE-AID    Flowsheet Row ED to Hosp-Admission (Discharged) from 02/09/2023 in [facility]  CAGE-AID Score 0      Mini-Mental    Flowsheet Row Office Visit from 10/15/2020 in [facility] Office Visit from 10/10/2019 in [facility] Office Visit from 09/27/2018 in [facility]  Office Visit from 12/22/2017 in [facility] Office Visit from 09/21/2017 in [facility]  Total Score (max 30 points ) 27 28 29 30 28       PHQ2-9    Flowsheet Row Office Visit from 07/26/2023 in [facility] [place] Video Visit from 03/02/2022 in [facility] [place] [facility] Visit from 09/20/2018 in [facility]  PHQ-2 Total Score 6 0 0  PHQ-9 Total Score 18 -- --      Flowsheet Row ED from 09/29/2023 in [facility] [place] ED from 09/22/2023 in [facility] Office Visit from 07/26/2023 in [facility] [place]  C-SSRS RISK CATEGORY No Risk No Risk No Risk        Assessment and Plan: This patient is a 77 year old male with a history of depression anxiety and probable early dementia.  Unfortunately he misunderstood the directions and is taking both Cymbalta  and Prozac.  I explained we have to pick one of the other.  We will proceed with just the Cymbalta but increase the dosage to 60 mg twice daily.  He will return to see me in 6 weeks  Collaboration of Care: Collaboration of Care: Primary Care Provider AEB notes will be shared with PCP at patient's request  Patient/Guardian was advised Release of Information must be obtained prior to any record release in order to collaborate their care with an outside provider. Patient/Guardian was advised if they have not already done so to contact the registration department to sign all necessary forms in order for [place] to release information regarding their care.   Consent: Patient/Guardian gives verbal consent for treatment and assignment of benefits for services provided during this visit. Patient/Guardian expressed understanding and agreed to proceed.    Diannia Ruder, MD 10/01/2023, 10:02 AM

## 2023-10-05 ENCOUNTER — Ambulatory Visit: Admit: 2023-10-05 | Payer: PPO | Admitting: Urology

## 2023-10-05 SURGERY — TRANSURETHRAL RESECTION OF THE PROSTATE (TURP)
Anesthesia: General

## 2023-10-06 DIAGNOSIS — Z9181 History of falling: Secondary | ICD-10-CM | POA: Diagnosis not present

## 2023-10-06 DIAGNOSIS — R269 Unspecified abnormalities of gait and mobility: Secondary | ICD-10-CM | POA: Diagnosis not present

## 2023-10-06 DIAGNOSIS — S72145A Nondisplaced intertrochanteric fracture of left femur, initial encounter for closed fracture: Secondary | ICD-10-CM | POA: Diagnosis not present

## 2023-10-06 DIAGNOSIS — M25552 Pain in left hip: Secondary | ICD-10-CM | POA: Diagnosis not present

## 2023-10-11 DIAGNOSIS — Z9181 History of falling: Secondary | ICD-10-CM | POA: Diagnosis not present

## 2023-10-11 DIAGNOSIS — S72145A Nondisplaced intertrochanteric fracture of left femur, initial encounter for closed fracture: Secondary | ICD-10-CM | POA: Diagnosis not present

## 2023-10-11 DIAGNOSIS — R269 Unspecified abnormalities of gait and mobility: Secondary | ICD-10-CM | POA: Diagnosis not present

## 2023-10-11 DIAGNOSIS — M25552 Pain in left hip: Secondary | ICD-10-CM | POA: Diagnosis not present

## 2023-10-13 DIAGNOSIS — M25552 Pain in left hip: Secondary | ICD-10-CM | POA: Diagnosis not present

## 2023-10-13 DIAGNOSIS — Z9181 History of falling: Secondary | ICD-10-CM | POA: Diagnosis not present

## 2023-10-13 DIAGNOSIS — S72145A Nondisplaced intertrochanteric fracture of left femur, initial encounter for closed fracture: Secondary | ICD-10-CM | POA: Diagnosis not present

## 2023-10-13 DIAGNOSIS — R269 Unspecified abnormalities of gait and mobility: Secondary | ICD-10-CM | POA: Diagnosis not present

## 2023-10-15 DIAGNOSIS — I129 Hypertensive chronic kidney disease with stage 1 through stage 4 chronic kidney disease, or unspecified chronic kidney disease: Secondary | ICD-10-CM | POA: Diagnosis not present

## 2023-10-15 DIAGNOSIS — R809 Proteinuria, unspecified: Secondary | ICD-10-CM | POA: Diagnosis not present

## 2023-10-15 DIAGNOSIS — N1831 Chronic kidney disease, stage 3a: Secondary | ICD-10-CM | POA: Diagnosis not present

## 2023-10-18 DIAGNOSIS — S72145A Nondisplaced intertrochanteric fracture of left femur, initial encounter for closed fracture: Secondary | ICD-10-CM | POA: Diagnosis not present

## 2023-10-18 DIAGNOSIS — M25552 Pain in left hip: Secondary | ICD-10-CM | POA: Diagnosis not present

## 2023-10-18 DIAGNOSIS — R269 Unspecified abnormalities of gait and mobility: Secondary | ICD-10-CM | POA: Diagnosis not present

## 2023-10-18 DIAGNOSIS — Z9181 History of falling: Secondary | ICD-10-CM | POA: Diagnosis not present

## 2023-10-19 DIAGNOSIS — R809 Proteinuria, unspecified: Secondary | ICD-10-CM | POA: Diagnosis not present

## 2023-10-19 DIAGNOSIS — N1832 Chronic kidney disease, stage 3b: Secondary | ICD-10-CM | POA: Diagnosis not present

## 2023-10-19 DIAGNOSIS — N1831 Chronic kidney disease, stage 3a: Secondary | ICD-10-CM | POA: Diagnosis not present

## 2023-10-19 DIAGNOSIS — I10 Essential (primary) hypertension: Secondary | ICD-10-CM | POA: Diagnosis not present

## 2023-10-19 DIAGNOSIS — K5909 Other constipation: Secondary | ICD-10-CM | POA: Diagnosis not present

## 2023-10-19 DIAGNOSIS — I129 Hypertensive chronic kidney disease with stage 1 through stage 4 chronic kidney disease, or unspecified chronic kidney disease: Secondary | ICD-10-CM | POA: Diagnosis not present

## 2023-10-21 DIAGNOSIS — R809 Proteinuria, unspecified: Secondary | ICD-10-CM | POA: Diagnosis not present

## 2023-10-21 DIAGNOSIS — G4733 Obstructive sleep apnea (adult) (pediatric): Secondary | ICD-10-CM | POA: Diagnosis not present

## 2023-10-21 DIAGNOSIS — R600 Localized edema: Secondary | ICD-10-CM | POA: Diagnosis not present

## 2023-10-21 DIAGNOSIS — N1831 Chronic kidney disease, stage 3a: Secondary | ICD-10-CM | POA: Diagnosis not present

## 2023-10-21 DIAGNOSIS — I129 Hypertensive chronic kidney disease with stage 1 through stage 4 chronic kidney disease, or unspecified chronic kidney disease: Secondary | ICD-10-CM | POA: Diagnosis not present

## 2023-10-25 DIAGNOSIS — M25552 Pain in left hip: Secondary | ICD-10-CM | POA: Diagnosis not present

## 2023-10-25 DIAGNOSIS — R269 Unspecified abnormalities of gait and mobility: Secondary | ICD-10-CM | POA: Diagnosis not present

## 2023-10-25 DIAGNOSIS — Z9181 History of falling: Secondary | ICD-10-CM | POA: Diagnosis not present

## 2023-10-25 DIAGNOSIS — S72145A Nondisplaced intertrochanteric fracture of left femur, initial encounter for closed fracture: Secondary | ICD-10-CM | POA: Diagnosis not present

## 2023-10-27 DIAGNOSIS — Z9181 History of falling: Secondary | ICD-10-CM | POA: Diagnosis not present

## 2023-10-27 DIAGNOSIS — R269 Unspecified abnormalities of gait and mobility: Secondary | ICD-10-CM | POA: Diagnosis not present

## 2023-10-27 DIAGNOSIS — M25552 Pain in left hip: Secondary | ICD-10-CM | POA: Diagnosis not present

## 2023-10-27 DIAGNOSIS — S72145A Nondisplaced intertrochanteric fracture of left femur, initial encounter for closed fracture: Secondary | ICD-10-CM | POA: Diagnosis not present

## 2023-11-02 DIAGNOSIS — H35372 Puckering of macula, left eye: Secondary | ICD-10-CM | POA: Diagnosis not present

## 2023-11-02 DIAGNOSIS — H31092 Other chorioretinal scars, left eye: Secondary | ICD-10-CM | POA: Diagnosis not present

## 2023-11-02 DIAGNOSIS — H35352 Cystoid macular degeneration, left eye: Secondary | ICD-10-CM | POA: Diagnosis not present

## 2023-11-02 DIAGNOSIS — H30042 Focal chorioretinal inflammation, macular or paramacular, left eye: Secondary | ICD-10-CM | POA: Diagnosis not present

## 2023-11-04 DIAGNOSIS — R269 Unspecified abnormalities of gait and mobility: Secondary | ICD-10-CM | POA: Diagnosis not present

## 2023-11-04 DIAGNOSIS — S72145A Nondisplaced intertrochanteric fracture of left femur, initial encounter for closed fracture: Secondary | ICD-10-CM | POA: Diagnosis not present

## 2023-11-04 DIAGNOSIS — Z9181 History of falling: Secondary | ICD-10-CM | POA: Diagnosis not present

## 2023-11-04 DIAGNOSIS — M25552 Pain in left hip: Secondary | ICD-10-CM | POA: Diagnosis not present

## 2023-11-08 DIAGNOSIS — Z9181 History of falling: Secondary | ICD-10-CM | POA: Diagnosis not present

## 2023-11-08 DIAGNOSIS — M25552 Pain in left hip: Secondary | ICD-10-CM | POA: Diagnosis not present

## 2023-11-08 DIAGNOSIS — S72145A Nondisplaced intertrochanteric fracture of left femur, initial encounter for closed fracture: Secondary | ICD-10-CM | POA: Diagnosis not present

## 2023-11-08 DIAGNOSIS — R269 Unspecified abnormalities of gait and mobility: Secondary | ICD-10-CM | POA: Diagnosis not present

## 2023-11-09 DIAGNOSIS — I129 Hypertensive chronic kidney disease with stage 1 through stage 4 chronic kidney disease, or unspecified chronic kidney disease: Secondary | ICD-10-CM | POA: Diagnosis not present

## 2023-11-09 DIAGNOSIS — Z713 Dietary counseling and surveillance: Secondary | ICD-10-CM | POA: Diagnosis not present

## 2023-11-09 DIAGNOSIS — N189 Chronic kidney disease, unspecified: Secondary | ICD-10-CM | POA: Diagnosis not present

## 2023-11-09 DIAGNOSIS — Z87891 Personal history of nicotine dependence: Secondary | ICD-10-CM | POA: Diagnosis not present

## 2023-11-09 DIAGNOSIS — I10 Essential (primary) hypertension: Secondary | ICD-10-CM | POA: Diagnosis not present

## 2023-11-10 DIAGNOSIS — I10 Essential (primary) hypertension: Secondary | ICD-10-CM | POA: Diagnosis not present

## 2023-11-10 DIAGNOSIS — R269 Unspecified abnormalities of gait and mobility: Secondary | ICD-10-CM | POA: Diagnosis not present

## 2023-11-10 DIAGNOSIS — R339 Retention of urine, unspecified: Secondary | ICD-10-CM | POA: Diagnosis not present

## 2023-11-10 DIAGNOSIS — F332 Major depressive disorder, recurrent severe without psychotic features: Secondary | ICD-10-CM | POA: Diagnosis not present

## 2023-11-10 DIAGNOSIS — M25552 Pain in left hip: Secondary | ICD-10-CM | POA: Diagnosis not present

## 2023-11-10 DIAGNOSIS — Z9181 History of falling: Secondary | ICD-10-CM | POA: Diagnosis not present

## 2023-11-10 DIAGNOSIS — R2689 Other abnormalities of gait and mobility: Secondary | ICD-10-CM | POA: Diagnosis not present

## 2023-11-10 DIAGNOSIS — R634 Abnormal weight loss: Secondary | ICD-10-CM | POA: Diagnosis not present

## 2023-11-10 DIAGNOSIS — J302 Other seasonal allergic rhinitis: Secondary | ICD-10-CM | POA: Diagnosis not present

## 2023-11-10 DIAGNOSIS — E2689 Other hyperaldosteronism: Secondary | ICD-10-CM | POA: Diagnosis not present

## 2023-11-10 DIAGNOSIS — S72145A Nondisplaced intertrochanteric fracture of left femur, initial encounter for closed fracture: Secondary | ICD-10-CM | POA: Diagnosis not present

## 2023-11-10 DIAGNOSIS — K5909 Other constipation: Secondary | ICD-10-CM | POA: Diagnosis not present

## 2023-11-15 DIAGNOSIS — S72145A Nondisplaced intertrochanteric fracture of left femur, initial encounter for closed fracture: Secondary | ICD-10-CM | POA: Diagnosis not present

## 2023-11-15 DIAGNOSIS — Z9181 History of falling: Secondary | ICD-10-CM | POA: Diagnosis not present

## 2023-11-15 DIAGNOSIS — M25552 Pain in left hip: Secondary | ICD-10-CM | POA: Diagnosis not present

## 2023-11-15 DIAGNOSIS — R269 Unspecified abnormalities of gait and mobility: Secondary | ICD-10-CM | POA: Diagnosis not present

## 2023-11-17 DIAGNOSIS — M25552 Pain in left hip: Secondary | ICD-10-CM | POA: Diagnosis not present

## 2023-11-17 DIAGNOSIS — S72145A Nondisplaced intertrochanteric fracture of left femur, initial encounter for closed fracture: Secondary | ICD-10-CM | POA: Diagnosis not present

## 2023-11-17 DIAGNOSIS — Z9181 History of falling: Secondary | ICD-10-CM | POA: Diagnosis not present

## 2023-11-17 DIAGNOSIS — R269 Unspecified abnormalities of gait and mobility: Secondary | ICD-10-CM | POA: Diagnosis not present

## 2023-11-21 DIAGNOSIS — G4733 Obstructive sleep apnea (adult) (pediatric): Secondary | ICD-10-CM | POA: Diagnosis not present

## 2023-11-23 DIAGNOSIS — R269 Unspecified abnormalities of gait and mobility: Secondary | ICD-10-CM | POA: Diagnosis not present

## 2023-11-23 DIAGNOSIS — Z9181 History of falling: Secondary | ICD-10-CM | POA: Diagnosis not present

## 2023-11-23 DIAGNOSIS — S72145A Nondisplaced intertrochanteric fracture of left femur, initial encounter for closed fracture: Secondary | ICD-10-CM | POA: Diagnosis not present

## 2023-11-23 DIAGNOSIS — M25552 Pain in left hip: Secondary | ICD-10-CM | POA: Diagnosis not present

## 2023-11-24 DIAGNOSIS — M25512 Pain in left shoulder: Secondary | ICD-10-CM | POA: Diagnosis not present

## 2023-11-29 DIAGNOSIS — R269 Unspecified abnormalities of gait and mobility: Secondary | ICD-10-CM | POA: Diagnosis not present

## 2023-11-29 DIAGNOSIS — M25552 Pain in left hip: Secondary | ICD-10-CM | POA: Diagnosis not present

## 2023-11-29 DIAGNOSIS — Z9181 History of falling: Secondary | ICD-10-CM | POA: Diagnosis not present

## 2023-11-29 DIAGNOSIS — S72145A Nondisplaced intertrochanteric fracture of left femur, initial encounter for closed fracture: Secondary | ICD-10-CM | POA: Diagnosis not present

## 2023-12-02 DIAGNOSIS — M25512 Pain in left shoulder: Secondary | ICD-10-CM | POA: Diagnosis not present

## 2023-12-06 DIAGNOSIS — M25552 Pain in left hip: Secondary | ICD-10-CM | POA: Diagnosis not present

## 2023-12-06 DIAGNOSIS — Z9181 History of falling: Secondary | ICD-10-CM | POA: Diagnosis not present

## 2023-12-06 DIAGNOSIS — R269 Unspecified abnormalities of gait and mobility: Secondary | ICD-10-CM | POA: Diagnosis not present

## 2023-12-06 DIAGNOSIS — S72145A Nondisplaced intertrochanteric fracture of left femur, initial encounter for closed fracture: Secondary | ICD-10-CM | POA: Diagnosis not present

## 2023-12-07 DIAGNOSIS — Z6828 Body mass index (BMI) 28.0-28.9, adult: Secondary | ICD-10-CM | POA: Diagnosis not present

## 2023-12-07 DIAGNOSIS — J019 Acute sinusitis, unspecified: Secondary | ICD-10-CM | POA: Diagnosis not present

## 2023-12-07 DIAGNOSIS — Z713 Dietary counseling and surveillance: Secondary | ICD-10-CM | POA: Diagnosis not present

## 2023-12-08 DIAGNOSIS — Z9181 History of falling: Secondary | ICD-10-CM | POA: Diagnosis not present

## 2023-12-08 DIAGNOSIS — M25552 Pain in left hip: Secondary | ICD-10-CM | POA: Diagnosis not present

## 2023-12-08 DIAGNOSIS — S72145A Nondisplaced intertrochanteric fracture of left femur, initial encounter for closed fracture: Secondary | ICD-10-CM | POA: Diagnosis not present

## 2023-12-08 DIAGNOSIS — R269 Unspecified abnormalities of gait and mobility: Secondary | ICD-10-CM | POA: Diagnosis not present

## 2023-12-13 DIAGNOSIS — Z9181 History of falling: Secondary | ICD-10-CM | POA: Diagnosis not present

## 2023-12-13 DIAGNOSIS — S72145A Nondisplaced intertrochanteric fracture of left femur, initial encounter for closed fracture: Secondary | ICD-10-CM | POA: Diagnosis not present

## 2023-12-13 DIAGNOSIS — M25552 Pain in left hip: Secondary | ICD-10-CM | POA: Diagnosis not present

## 2023-12-13 DIAGNOSIS — R269 Unspecified abnormalities of gait and mobility: Secondary | ICD-10-CM | POA: Diagnosis not present

## 2023-12-15 DIAGNOSIS — M25552 Pain in left hip: Secondary | ICD-10-CM | POA: Diagnosis not present

## 2023-12-15 DIAGNOSIS — R269 Unspecified abnormalities of gait and mobility: Secondary | ICD-10-CM | POA: Diagnosis not present

## 2023-12-15 DIAGNOSIS — S72145A Nondisplaced intertrochanteric fracture of left femur, initial encounter for closed fracture: Secondary | ICD-10-CM | POA: Diagnosis not present

## 2023-12-15 DIAGNOSIS — Z9181 History of falling: Secondary | ICD-10-CM | POA: Diagnosis not present

## 2023-12-17 DIAGNOSIS — S46012A Strain of muscle(s) and tendon(s) of the rotator cuff of left shoulder, initial encounter: Secondary | ICD-10-CM | POA: Diagnosis not present

## 2023-12-21 DIAGNOSIS — G4733 Obstructive sleep apnea (adult) (pediatric): Secondary | ICD-10-CM | POA: Diagnosis not present

## 2023-12-30 DIAGNOSIS — C672 Malignant neoplasm of lateral wall of bladder: Secondary | ICD-10-CM | POA: Diagnosis not present

## 2023-12-30 DIAGNOSIS — R339 Retention of urine, unspecified: Secondary | ICD-10-CM | POA: Diagnosis not present

## 2024-01-04 DIAGNOSIS — I10 Essential (primary) hypertension: Secondary | ICD-10-CM | POA: Diagnosis not present

## 2024-01-04 DIAGNOSIS — E118 Type 2 diabetes mellitus with unspecified complications: Secondary | ICD-10-CM | POA: Diagnosis not present

## 2024-01-10 DIAGNOSIS — R634 Abnormal weight loss: Secondary | ICD-10-CM | POA: Diagnosis not present

## 2024-01-10 DIAGNOSIS — K5909 Other constipation: Secondary | ICD-10-CM | POA: Diagnosis not present

## 2024-01-10 DIAGNOSIS — F332 Major depressive disorder, recurrent severe without psychotic features: Secondary | ICD-10-CM | POA: Diagnosis not present

## 2024-01-10 DIAGNOSIS — Z8551 Personal history of malignant neoplasm of bladder: Secondary | ICD-10-CM | POA: Diagnosis not present

## 2024-01-10 DIAGNOSIS — R2689 Other abnormalities of gait and mobility: Secondary | ICD-10-CM | POA: Diagnosis not present

## 2024-01-10 DIAGNOSIS — S72142D Displaced intertrochanteric fracture of left femur, subsequent encounter for closed fracture with routine healing: Secondary | ICD-10-CM | POA: Diagnosis not present

## 2024-01-10 DIAGNOSIS — E118 Type 2 diabetes mellitus with unspecified complications: Secondary | ICD-10-CM | POA: Diagnosis not present

## 2024-01-10 DIAGNOSIS — I10 Essential (primary) hypertension: Secondary | ICD-10-CM | POA: Diagnosis not present

## 2024-01-10 DIAGNOSIS — I129 Hypertensive chronic kidney disease with stage 1 through stage 4 chronic kidney disease, or unspecified chronic kidney disease: Secondary | ICD-10-CM | POA: Diagnosis not present

## 2024-01-10 DIAGNOSIS — E1122 Type 2 diabetes mellitus with diabetic chronic kidney disease: Secondary | ICD-10-CM | POA: Diagnosis not present

## 2024-01-10 DIAGNOSIS — R339 Retention of urine, unspecified: Secondary | ICD-10-CM | POA: Diagnosis not present

## 2024-01-10 DIAGNOSIS — N1832 Chronic kidney disease, stage 3b: Secondary | ICD-10-CM | POA: Diagnosis not present

## 2024-01-10 DIAGNOSIS — Z87442 Personal history of urinary calculi: Secondary | ICD-10-CM | POA: Diagnosis not present

## 2024-01-17 DIAGNOSIS — I129 Hypertensive chronic kidney disease with stage 1 through stage 4 chronic kidney disease, or unspecified chronic kidney disease: Secondary | ICD-10-CM | POA: Diagnosis not present

## 2024-01-17 DIAGNOSIS — R600 Localized edema: Secondary | ICD-10-CM | POA: Diagnosis not present

## 2024-01-17 DIAGNOSIS — N1831 Chronic kidney disease, stage 3a: Secondary | ICD-10-CM | POA: Diagnosis not present

## 2024-01-17 DIAGNOSIS — N200 Calculus of kidney: Secondary | ICD-10-CM | POA: Diagnosis not present

## 2024-01-20 DIAGNOSIS — I129 Hypertensive chronic kidney disease with stage 1 through stage 4 chronic kidney disease, or unspecified chronic kidney disease: Secondary | ICD-10-CM | POA: Diagnosis not present

## 2024-01-20 DIAGNOSIS — N1831 Chronic kidney disease, stage 3a: Secondary | ICD-10-CM | POA: Diagnosis not present

## 2024-01-20 DIAGNOSIS — R809 Proteinuria, unspecified: Secondary | ICD-10-CM | POA: Diagnosis not present

## 2024-01-20 DIAGNOSIS — R338 Other retention of urine: Secondary | ICD-10-CM | POA: Diagnosis not present

## 2024-01-21 DIAGNOSIS — G4733 Obstructive sleep apnea (adult) (pediatric): Secondary | ICD-10-CM | POA: Diagnosis not present

## 2024-01-28 DIAGNOSIS — S46012D Strain of muscle(s) and tendon(s) of the rotator cuff of left shoulder, subsequent encounter: Secondary | ICD-10-CM | POA: Diagnosis not present

## 2024-01-30 ENCOUNTER — Other Ambulatory Visit (HOSPITAL_COMMUNITY): Payer: Self-pay | Admitting: Psychiatry

## 2024-01-31 DIAGNOSIS — H31092 Other chorioretinal scars, left eye: Secondary | ICD-10-CM | POA: Diagnosis not present

## 2024-01-31 DIAGNOSIS — R339 Retention of urine, unspecified: Secondary | ICD-10-CM | POA: Diagnosis not present

## 2024-01-31 DIAGNOSIS — H35372 Puckering of macula, left eye: Secondary | ICD-10-CM | POA: Diagnosis not present

## 2024-01-31 DIAGNOSIS — H30042 Focal chorioretinal inflammation, macular or paramacular, left eye: Secondary | ICD-10-CM | POA: Diagnosis not present

## 2024-01-31 DIAGNOSIS — H35352 Cystoid macular degeneration, left eye: Secondary | ICD-10-CM | POA: Diagnosis not present

## 2024-01-31 NOTE — Telephone Encounter (Signed)
 Call for appt

## 2024-01-31 NOTE — Telephone Encounter (Signed)
Called pt to schedule with Dr Tenny Craw no answer left vm

## 2024-02-04 DIAGNOSIS — H401133 Primary open-angle glaucoma, bilateral, severe stage: Secondary | ICD-10-CM | POA: Diagnosis not present

## 2024-02-07 DIAGNOSIS — R339 Retention of urine, unspecified: Secondary | ICD-10-CM | POA: Diagnosis not present

## 2024-02-10 DIAGNOSIS — N3289 Other specified disorders of bladder: Secondary | ICD-10-CM | POA: Diagnosis not present

## 2024-02-10 DIAGNOSIS — R339 Retention of urine, unspecified: Secondary | ICD-10-CM | POA: Diagnosis not present

## 2024-02-10 DIAGNOSIS — N401 Enlarged prostate with lower urinary tract symptoms: Secondary | ICD-10-CM | POA: Diagnosis not present

## 2024-02-10 DIAGNOSIS — C672 Malignant neoplasm of lateral wall of bladder: Secondary | ICD-10-CM | POA: Diagnosis not present

## 2024-02-11 NOTE — Telephone Encounter (Signed)
Scheduled 02/18/24

## 2024-02-11 NOTE — Telephone Encounter (Signed)
Called pt no answer left vm

## 2024-02-18 ENCOUNTER — Encounter (HOSPITAL_COMMUNITY): Payer: Self-pay | Admitting: Psychiatry

## 2024-02-18 ENCOUNTER — Telehealth (HOSPITAL_COMMUNITY): Payer: PPO | Admitting: Psychiatry

## 2024-02-18 DIAGNOSIS — F331 Major depressive disorder, recurrent, moderate: Secondary | ICD-10-CM | POA: Diagnosis not present

## 2024-02-18 MED ORDER — DULOXETINE HCL 60 MG PO CPEP: 60.0000 mg | ORAL_CAPSULE | Freq: Two times a day (BID) | ORAL | 3 refills | Status: DC

## 2024-02-18 NOTE — Progress Notes (Signed)
 Virtual Visit via Telephone Note  I connected with Zachary [name] on 02/18/24 at 10:20 AM EST by telephone and verified that I am speaking with the correct person using two identifiers.  Location: Patient: home Provider: office   I discussed the limitations, risks, security and privacy concerns of performing an evaluation and management service by telephone and the availability of in person appointments. I also discussed with the patient that there may be a patient responsible charge related to this service. The patient expressed understanding and agreed to proceed.      I discussed the assessment and treatment plan with the patient. The patient was provided an opportunity to ask questions and all were answered. The patient agreed with the plan and demonstrated an understanding of the instructions.   The patient was advised to call back or seek an in-person evaluation if the symptoms worsen or if the condition fails to improve as anticipated.  I provided 20 minutes of non-face-to-face time during this encounter.   Diannia Ruder, MD  [facility] MD/PA/NP OP Progress Note  02/18/2024 10:40 AM Zachary [name]  MRN:  [id number]  Chief Complaint:  Chief Complaint  Patient presents with   Anxiety   Depression   Follow-up   HPI: This patient is a 78 year old married white male who lives with his wife in [place]. They have no children. He is retired from [employer] field  The patient returns for follow-up after about 5 months regarding his depression.  Last time he seemed sluggish tired and had no energy.  We did increase his Cymbalta to 60 mg twice daily.  He sounds much better today.  He states that his mood is much improved.  He is dealing with urological problems and has to self cath but other than that he states his health is pretty good and his leg that he broke last year is healed.  He walks only with a cane.  He is able to drive his own vehicle and go to the store on his own.  He seems  much sharper and more alert.  Visit Diagnosis:    ICD-10-CM   1. Major depressive disorder, recurrent episode, moderate (HCC)  F33.1       Past Psychiatric History: Long-term outpatient treatment  Past Medical History:  Past Medical History:  Diagnosis Date   Anxiety    takes Valium as needed   Arthritis    Bladder cancer (HCC)    takes Rapaflo daily   Blood dyscrasia    07/08/16: pt told he was a "free bleeder" after bleeding a lot when derm cut off mole on forehead. No excessive bleeding with minor wounds at home, no bleeding problems perioperatively with prior surgeries.   Chronic back pain    spondylolisthesis   Cluster headaches    Depression    takes Lexapro daily   Essential hypertension, benign    takes Diovan-HCT daily   Hearing loss    hearing aids   History of kidney stones    Hx of complications due to general anesthesia    Aborted surgery 07/15/2016 due to hypotension per pt   Joint pain    Kidney stone 03/2019   passed stone   Obstructive sleep apnea on CPAP    uses CPAP @ night   Parkinsonism (HCC)    Pneumonia    "several times"--last time about 3-4 yrs ago   Pre-diabetes    Prediabetes    Syncopal episodes    Thyroid condition  Past Surgical History:  Procedure Laterality Date   ANTERIOR CERVICAL DECOMP/DISCECTOMY FUSION N/A 06/05/2013   Procedure:  C3-4 Anterior Cervical Discectomy and Fusion, Allograft, Plate;  Surgeon: Eldred Manges, MD;  Location: [facility];  Service: Orthopedics;  Laterality: N/A;  C3-4 Anterior Cervical Discectomy and Fusion, Allograft, Plate   Arthroscopic knee surgery     BACK SURGERY      x 2   BLADDER SURGERY     x 5 to remove tumor   CARPAL TUNNEL RELEASE     CATARACT EXTRACTION W/PHACO  12/19/2012   Procedure: CATARACT EXTRACTION PHACO AND INTRAOCULAR LENS PLACEMENT (IOC);  Surgeon: Gemma Payor, MD;  Location: [facility];  Service: Ophthalmology;  Laterality: Left;  CDE: 26.80   CATARACT EXTRACTION W/PHACO  01/09/2013    Procedure: CATARACT EXTRACTION PHACO AND INTRAOCULAR LENS PLACEMENT (IOC);  Surgeon: Gemma Payor, MD;  Location: [facility];  Service: Ophthalmology;  Laterality: Right;  CDE:22.83   CERVICAL FUSION  06/05/2013   C 3  C4   CYSTOSCOPY     CYSTOSCOPY W/ URETERAL STENT PLACEMENT Bilateral 02/12/2020   Procedure: CYSTOSCOPY WITH RETROGRADE PYELOGRAM/URETERAL STENT PLACEMENT;  Surgeon: Bjorn Pippin, MD;  Location: [facility];  Service: Urology;  Laterality: Bilateral;   INTRAMEDULLARY (IM) NAIL INTERTROCHANTERIC Left 02/10/2023   Procedure: LEFT INTRAMEDULLARY (IM) NAILING OF LEFT FEMUR;  Surgeon: Roby Lofts, MD;  Location: [facility];  Service: Orthopedics;  Laterality: Left;   LITHOTRIPSY  02/2020   REFRACTIVE SURGERY     Hx: of   RETINAL DETACHMENT SURGERY     TOTAL KNEE ARTHROPLASTY Left 02/01/2018   Procedure: TOTAL KNEE ARTHROPLASTY;  Surgeon: Vickki Hearing, MD;  Location: [facility];  Service: Orthopedics;  Laterality: Left;   TRANSURETHRAL RESECTION OF BLADDER TUMOR Right 07/27/2015   Procedure: TRANSURETHRAL RESECTION OF BLADDER TUMOR (TURBT);  Surgeon: Jethro Bolus, MD;  Location: [facility];  Service: Urology;  Laterality: Right;   TRANSURETHRAL RESECTION OF BLADDER TUMOR WITH MITOMYCIN-C N/A 01/25/2023   Procedure: TRANSURETHRAL RESECTION OF BLADDER TUMOR WITH GEMCITABINE;  Surgeon: Crista Elliot, MD;  Location: [facility];  Service: Urology;  Laterality: N/A;   wisdom tooth extraction      Family Psychiatric History: See below  Family History:  Family History  Problem Relation Age of Onset   Hypertension Father    Cancer Father        Prostate   Depression Mother    Hypertension Mother    Alcohol abuse Mother    COPD Mother        passed 07-08-17   Hypertension Sister    Anxiety disorder Sister    Alcohol abuse Maternal Grandfather    ADD / ADHD Neg Hx    Bipolar disorder Neg Hx    Dementia Neg Hx    Drug abuse Neg Hx    OCD Neg Hx    Paranoid behavior Neg Hx    Schizophrenia  Neg Hx    Seizures Neg Hx    Sexual abuse Neg Hx    Physical abuse Neg Hx     Social History:  Social History   Socioeconomic History   Marital status: Married    Spouse name: Garment/textile technologist    Number of children: 0   Years of education: 13   Highest education level: Not on file  Occupational History   Occupation: Retired    Comment: med [employer]    Employer: UNEMPLOYED  Tobacco Use   Smoking status: Former  Current packs/day: 0.00    Average packs/day: 0.3 packs/day for 1 year (0.3 ttl pk-yrs)    Types: Cigarettes, Cigars    Start date: 12/31/1975    Quit date: 12/30/1976    Years since quitting: 47.1    Passive exposure: Past   Smokeless tobacco: Never   Tobacco comments:    1985  Vaping Use   Vaping status: Never Used  Substance and Sexual Activity   Alcohol use: No    Comment: quit 1990   Drug use: No   Sexual activity: Yes  Other Topics Concern   Not on file  Social History Narrative   Lives with wife    caffeine- sodas   Right handed   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    Social Drivers of Health   Financial Resource Strain: Not on file  Food Insecurity: No Food Insecurity (02/10/2023)   Hunger Vital Sign    Worried About Running Out of Food in the Last Year: Never true    Ran Out of Food in the Last Year: Never true  Transportation Needs: No Transportation Needs (02/10/2023)   PRAPARE - Administrator, Civil Service (Medical): No    Lack of Transportation (Non-Medical): No  Physical Activity: Not on file  Stress: Not on file  Social Connections: Not on file    Allergies:  Allergies  Allergen Reactions   Poison Oak Extract Hives and Itching   Sulfonamide Derivatives Itching and Rash   Sulfa Antibiotics     Metabolic Disorder Labs: Lab Results  Component Value Date   HGBA1C 6.3 12/01/2022   MPG 120 07/22/2016   No results found for: "PROLACTIN" No results found for: "CHOL", "TRIG", "HDL", "CHOLHDL", "VLDL",  "LDLCALC" Lab Results  Component Value Date   TSH 2.701 02/03/2021    Therapeutic Level Labs: No results found for: "LITHIUM" No results found for: "VALPROATE" Lab Results  Component Value Date   CBMZ 8.2 04/10/2013    Current Medications: Current Outpatient Medications  Medication Sig Dispense Refill   acetaminophen (TYLENOL) 500 MG tablet Take 1,000 mg by mouth at bedtime as needed for moderate pain.     bethanechol (URECHOLINE) 25 MG tablet Take 25 mg by mouth 3 (three) times daily.     carvedilol (COREG) 3.125 MG tablet Take 3.125 mg by mouth 2 (two) times daily.     dorzolamide-timolol (COSOPT) 2-0.5 % ophthalmic solution Place 1 drop into both eyes 2 (two) times daily.     DULoxetine (CYMBALTA) 60 MG capsule Take 1 capsule (60 mg total) by mouth 2 (two) times daily. 60 capsule 3   HYDROcodone-acetaminophen (NORCO/VICODIN) 5-325 MG tablet Take 1 tablet by mouth 3 (three) times daily as needed.     latanoprost (XALATAN) 0.005 % ophthalmic solution Place 1 drop into both eyes at bedtime.     Multiple Vitamin (MULTIVITAMIN) tablet Take 1 tablet by mouth daily.     Multiple Vitamins-Minerals (PRESERVISION AREDS PO) Take by mouth.     pravastatin (PRAVACHOL) 10 MG tablet Take 10 mg by mouth at bedtime.     sodium bicarbonate 650 MG tablet Take 650 mg by mouth daily.     tamsulosin (FLOMAX) 0.4 MG CAPS capsule Take 0.4 mg by mouth daily.     No current facility-administered medications for this visit.     Musculoskeletal: Strength & Muscle Tone: na Gait & Station: na Patient leans: N/A  Psychiatric Specialty Exam: Review of Systems  Genitourinary:  Positive for difficulty urinating.  Musculoskeletal:  Positive for arthralgias.  All other systems reviewed and are negative.   There were no vitals taken for this visit.There is no height or weight on file to calculate BMI.  General Appearance: NA  Eye Contact:  NA  Speech:  Clear and Coherent  Volume:  Normal  Mood:   Euthymic  Affect:  NA  Thought Process:  Goal Directed  Orientation:  Full (Time, Place, and Person)  Thought Content: WDL   Suicidal Thoughts:  No  Homicidal Thoughts:  No  Memory:  Immediate;   Good Recent;   Good Remote;   Fair  Judgement:  Good  Insight:  Fair  Psychomotor Activity:  Normal  Concentration:  Concentration: Good and Attention Span: Good  Recall:  Fair  Fund of Knowledge: Good  Language: Good  Akathisia:  No  Handed:  Right  AIMS (if indicated): not done  Assets:  Communication Skills Desire for Improvement Resilience Social Support  ADL's:  Intact  Cognition: WNL  Sleep:  Good   Screenings: CAGE-AID    Flowsheet Row ED to Hosp-Admission (Discharged) from 02/09/2023 in [facility]  CAGE-AID Score 0      Mini-Mental    Flowsheet Row Office Visit from 10/15/2020 in [facility] Office Visit from 10[facility]10/2019 in [facility] Office Visit from 09/27/2018 in [facility] Office Visit from 12/22/2017 in [facility] Office Visit from 09/21/2017 in [facility]  Total Score (max 30 points ) 27 28 29 30 28       PHQ2-9    Flowsheet Row Office Visit from 07/26/2023 in [facility] [place] Video Visit from 03/02/2022 in [facility] [place] Office Visit from 09/20/2018 in [facility]  PHQ-2 Total Score 6 0 0  PHQ-9 Total Score 18 -- --      Flowsheet Row ED from 09/29/2023 in [facility] [place] ED from 09/22/2023 in [facility] Office Visit from 07/26/2023 in [facility] [place]  C-SSRS RISK CATEGORY No Risk No Risk No Risk        Assessment and Plan: This patient is a 78 year old male with a history of depression  and anxiety.  He is doing much better with the Cymbalta 60 mg twice daily.  His memory has improved as well.  He will continue this dosage and return to see me in 4 months.  Collaboration of Care: Collaboration of Care: Primary Care Provider AEB notes will be shared with PCP at patient's request  Patient/Guardian was advised Release of Information must be obtained prior to any record release in order to collaborate their care with an outside provider. Patient/Guardian was advised if they have not already done so to contact the registration department to sign all necessary forms in order for [place] to release information regarding their care.   Consent: Patient/Guardian gives verbal consent for treatment and assignment of benefits for services provided during this visit. Patient/Guardian expressed understanding and agreed to proceed.    Diannia Ruder, MD 02/18/2024, 10:40 AM

## 2024-02-21 DIAGNOSIS — G4733 Obstructive sleep apnea (adult) (pediatric): Secondary | ICD-10-CM | POA: Diagnosis not present

## 2024-03-20 DIAGNOSIS — G4733 Obstructive sleep apnea (adult) (pediatric): Secondary | ICD-10-CM | POA: Diagnosis not present

## 2024-04-12 DIAGNOSIS — G629 Polyneuropathy, unspecified: Secondary | ICD-10-CM | POA: Insufficient documentation

## 2024-04-17 ENCOUNTER — Encounter: Payer: Self-pay | Admitting: Orthopedic Surgery

## 2024-04-17 ENCOUNTER — Ambulatory Visit (INDEPENDENT_AMBULATORY_CARE_PROVIDER_SITE_OTHER): Admitting: Orthopedic Surgery

## 2024-04-17 ENCOUNTER — Telehealth: Payer: Self-pay | Admitting: Orthopedic Surgery

## 2024-04-17 ENCOUNTER — Other Ambulatory Visit (INDEPENDENT_AMBULATORY_CARE_PROVIDER_SITE_OTHER): Payer: Self-pay

## 2024-04-17 VITALS — BP 152/101 | HR 74 | Ht 73.0 in | Wt 195.0 lb

## 2024-04-17 DIAGNOSIS — Z96652 Presence of left artificial knee joint: Secondary | ICD-10-CM | POA: Diagnosis not present

## 2024-04-17 DIAGNOSIS — S72142S Displaced intertrochanteric fracture of left femur, sequela: Secondary | ICD-10-CM | POA: Diagnosis not present

## 2024-04-17 DIAGNOSIS — M5410 Radiculopathy, site unspecified: Secondary | ICD-10-CM

## 2024-04-17 DIAGNOSIS — M25552 Pain in left hip: Secondary | ICD-10-CM | POA: Diagnosis not present

## 2024-04-17 DIAGNOSIS — Z981 Arthrodesis status: Secondary | ICD-10-CM

## 2024-04-17 MED ORDER — METHYLPREDNISOLONE ACETATE 40 MG/ML IJ SUSP
40.0000 mg | Freq: Once | INTRAMUSCULAR | Status: AC
  Administered 2024-04-17: 40 mg via INTRA_ARTICULAR

## 2024-04-17 MED ORDER — PREDNISONE 10 MG (48) PO TBPK: ORAL_TABLET | Freq: Every day | ORAL | 0 refills | Status: DC

## 2024-04-17 MED ORDER — PREDNISONE 10 MG (48) PO TBPK
ORAL_TABLET | Freq: Every day | ORAL | 0 refills | Status: DC
Start: 2024-04-17 — End: 2024-04-17

## 2024-04-17 NOTE — Telephone Encounter (Signed)
 Sorry, I accidentally sent this in the pool instead of sending it to Dr. Marvina Slough

## 2024-04-17 NOTE — Progress Notes (Signed)
  Intake history:  BP (!) 152/101   Pulse 74   Ht 6\' 1"  (1.854 m)   Wt 195 lb (88.5 kg)   BMI 25.73 kg/m  Body mass index is 25.73 kg/m.    WHAT ARE WE SEEING YOU FOR TODAY?   Left leg  Knee pain Ankle pain Left leg weakness has to lift leg to get in bath and in car   How long has this bothered you? (DOI?DOS?WS?) ongoing for years    Anticoag.  No  Diabetes No  Heart disease No  Hypertension Yes  SMOKING HX No  Kidney disease     Latest Ref Rng & Units 07/05/2023    1:44 PM 02/23/2023   12:35 PM 02/13/2023    4:02 AM  CMP  Glucose 70 - 99 mg/dL 96  440  102   BUN 8 - 23 mg/dL 20  49  29   Creatinine 0.61 - 1.24 mg/dL 7.25  3.66  4.40   Sodium 135 - 145 mmol/L 138  136  139   Potassium 3.5 - 5.1 mmol/L 3.5  5.0  3.9   Chloride 98 - 111 mmol/L 107  106  108   CO2 22 - 32 mmol/L 24  24  23    Calcium 8.9 - 10.3 mg/dL 8.4  8.8  8.3   Total Protein 6.5 - 8.1 g/dL 6.5   4.9   Total Bilirubin 0.3 - 1.2 mg/dL 0.6   0.2   Alkaline Phos 38 - 126 U/L 136   55   AST 15 - 41 U/L 15   15   ALT 0 - 44 U/L 12   5      Any ALLERGIES _________ Allergies  Allergen Reactions   Poison Oak Extract Hives and Itching   Sulfonamide Derivatives Itching and Rash   Sulfa Antibiotics    _____________________________________   Treatment:  Have you taken:  Tylenol  Yes  Advil  Yes  Had PT No  Had injection No  Other  ______________TKR left _____2/15/19______

## 2024-04-17 NOTE — Progress Notes (Signed)
 Chief Complaint  Patient presents with   Knee Pain    Left / has pain never improved after he had injury 2021    Ankle Pain    Left    Leg Problem    Has to lift left leg to get into the bathtub or out of the car    History  Zachary [name] is 78 years old he had a total knee replacement several years ago subsequently fractured his left hip sustaining intertrochanteric fracture which was treated with a short intramedullary nailing he has also had a lumbar fusion  Comes in today with left hip and left leg pain  His left hip pain is over the greater trochanter it is isolated in that area.  He has a separate pain that runs from his lower back across his buttock lateral thigh down the lateral leg into the left foot  Imaging studies were obtained  DG HIP UNILAT WITH PELVIS 2-3 VIEWS LEFT Result Date: 04/17/2024 X-ray of the left hip with pelvis Intertrochanteric fracture left hip treated with a short cephalomedullary nail despite the initial comminution the fracture healed in good position and the hardware stayed in good position Lumbar fusion hardware noted Impression expected healing without complication of the hardware left hip intertrochanteric fracture Note mild narrowing left hip joint no significant arthritis of the joint    Other imaging required for evaluation  X-ray of the initial injury shows a comminuted fracture of the intertrochanteric region left hip with a left total knee implant in place  Postop film shows good position and compared to the above-mentioned film no significant change in the hardware and the fracture did heal as stated    Physical Exam Vitals and nursing note reviewed.  Constitutional:      Appearance: Normal appearance.  HENT:     Head: Normocephalic and atraumatic.  Eyes:     General: No scleral icterus.       Right eye: No discharge.        Left eye: No discharge.     Extraocular Movements: Extraocular movements intact.     Conjunctiva/sclera:  Conjunctivae normal.     Pupils: Pupils are equal, round, and reactive to light.  Cardiovascular:     Rate and Rhythm: Normal rate.     Pulses: Normal pulses.  Musculoskeletal:     Comments: Lumbar spine tenderness right side left side central lower back left buttock left lateral leg all the way down the lateral thigh and anterolateral compartment of the lower leg  Normal knee extension power normal dorsiflexion and plantarflexion power normal range of motion left hip without pain poor hip flexion strength  Skin:    General: Skin is warm and dry.     Capillary Refill: Capillary refill takes less than 2 seconds.  Neurological:     General: No focal deficit present.     Mental Status: He is alert and oriented to person, place, and time.  Psychiatric:        Mood and Affect: Mood normal.        Behavior: Behavior normal.        Thought Content: Thought content normal.        Judgment: Judgment normal.     Assessment and plan  left greater trochanteric bursitis  Radicular left leg pain  Recommend injection for the left greater trochanteric bursitis  Procedure note injection for left hip bursitis  Verbal consent was obtained for injection of the  left hip   Timeout was  completed to confirm the injection site  The medications used were 40 mg of Depo-Medrol  and 1% lidocaine  3 cc  Anesthesia was provided by ethyl chloride and the skin was prepped with alcohol .  After cleaning the skin with alcohol  a 25-gauge needle was used to inject the left hip greater trochanteric bursa  Recommend prednisone  Dosepak to try to address the radicular symptoms and if that does not help after 2 weeks we will have him see the neurosurgeon who did the surgery  Meds ordered this encounter  Medications   DISCONTD: predniSONE  (STERAPRED UNI-PAK 48 TAB) 10 MG (48) TBPK tablet    Sig: Take by mouth daily. 10 mg ds 12 days    Dispense:  48 tablet    Refill:  0   methylPREDNISolone  acetate  (DEPO-MEDROL ) injection 40 mg   DISCONTD: predniSONE  (STERAPRED UNI-PAK 48 TAB) 10 MG (48) TBPK tablet    Sig: Take by mouth daily. 10 mg ds 12 days    Dispense:  48 tablet    Refill:  0

## 2024-04-17 NOTE — Telephone Encounter (Signed)
 Dr. Delfino Fellers pt - pt lvm stating he just left the office, he stated he forgot to let us  know that he is no longer with [facility], he now uses [facility].

## 2024-04-17 NOTE — Telephone Encounter (Signed)
 Its ok I resent Dr Marvina Slough is on way out the door.

## 2024-04-20 DIAGNOSIS — G4733 Obstructive sleep apnea (adult) (pediatric): Secondary | ICD-10-CM | POA: Diagnosis not present

## 2024-04-24 ENCOUNTER — Other Ambulatory Visit: Payer: Self-pay | Admitting: Orthopedic Surgery

## 2024-04-24 DIAGNOSIS — M25552 Pain in left hip: Secondary | ICD-10-CM

## 2024-04-24 MED ORDER — PREDNISONE 10 MG (48) PO TBPK: ORAL_TABLET | Freq: Every day | ORAL | 0 refills | Status: DC

## 2024-05-01 ENCOUNTER — Ambulatory Visit: Admitting: Orthopedic Surgery

## 2024-05-01 ENCOUNTER — Encounter: Payer: Self-pay | Admitting: Orthopedic Surgery

## 2024-05-01 ENCOUNTER — Other Ambulatory Visit (INDEPENDENT_AMBULATORY_CARE_PROVIDER_SITE_OTHER): Payer: Self-pay

## 2024-05-01 DIAGNOSIS — M25572 Pain in left ankle and joints of left foot: Secondary | ICD-10-CM

## 2024-05-01 NOTE — Progress Notes (Addendum)
 Follow-up appointment for Zachary [name]  Last visit he was having left leg pain knee pain ankle pain numbness and tingling in his left leg  We treated him with prednisone  and gave him a shot for his bursitis he says he is improved except  The left ankle is swollen tingling and painful That pain has been intermittent over the last few years after a fall he had where he did not break anything  He says that the ankle swell at times and we think this may be secondary to his renal failure for which he was taking a fluid pill which was subsequently discontinued  He does catheter himself 4 times a day  Exam shows edema both legs skin changes both legs  Tenderness over the lateral ankle  Range of motion is somewhat limited but excursion is pretty good  DG HIP UNILAT WITH PELVIS 2-3 VIEWS LEFT Result Date: 04/17/2024 X-ray of the left hip with pelvis Intertrochanteric fracture left hip treated with a short cephalomedullary nail despite the initial comminution the fracture healed in good position and the hardware stayed in good position Lumbar fusion hardware noted Impression expected healing without complication of the hardware left hip intertrochanteric fracture Note mild narrowing left hip joint no significant arthritis of the joint   DG Ankle Complete Left Result Date: 05/01/2024 Pain left ankle X-rays left ankle Ankle mortise intact Nonspecific calcifications around the ankle No acute fracture Left ankle normal     I recommend he get  \ TED hose I prescribed those  Talk to medical send about fluid pills  I still think he has some lumbar issues which may come back intermittently  But for now he can be discharged

## 2024-05-01 NOTE — Progress Notes (Signed)
   There were no vitals taken for this visit.  There is no height or weight on file to calculate BMI.  Chief Complaint  Patient presents with   Leg Pain    No diagnosis found.  DOI/DOS/ Date: ongoing  Improved bursitis pain is better states left ankle is cold and swollen

## 2024-05-02 DIAGNOSIS — R339 Retention of urine, unspecified: Secondary | ICD-10-CM | POA: Diagnosis not present

## 2024-05-03 DIAGNOSIS — E118 Type 2 diabetes mellitus with unspecified complications: Secondary | ICD-10-CM | POA: Diagnosis not present

## 2024-05-03 DIAGNOSIS — E782 Mixed hyperlipidemia: Secondary | ICD-10-CM | POA: Diagnosis not present

## 2024-05-03 DIAGNOSIS — Z125 Encounter for screening for malignant neoplasm of prostate: Secondary | ICD-10-CM | POA: Diagnosis not present

## 2024-05-03 DIAGNOSIS — D649 Anemia, unspecified: Secondary | ICD-10-CM | POA: Diagnosis not present

## 2024-05-03 DIAGNOSIS — E559 Vitamin D deficiency, unspecified: Secondary | ICD-10-CM | POA: Diagnosis not present

## 2024-05-04 DIAGNOSIS — R600 Localized edema: Secondary | ICD-10-CM | POA: Diagnosis not present

## 2024-05-04 DIAGNOSIS — I129 Hypertensive chronic kidney disease with stage 1 through stage 4 chronic kidney disease, or unspecified chronic kidney disease: Secondary | ICD-10-CM | POA: Diagnosis not present

## 2024-05-04 DIAGNOSIS — R338 Other retention of urine: Secondary | ICD-10-CM | POA: Diagnosis not present

## 2024-05-09 DIAGNOSIS — M19041 Primary osteoarthritis, right hand: Secondary | ICD-10-CM | POA: Diagnosis not present

## 2024-05-09 DIAGNOSIS — F332 Major depressive disorder, recurrent severe without psychotic features: Secondary | ICD-10-CM | POA: Diagnosis not present

## 2024-05-09 DIAGNOSIS — S72142D Displaced intertrochanteric fracture of left femur, subsequent encounter for closed fracture with routine healing: Secondary | ICD-10-CM | POA: Diagnosis not present

## 2024-05-09 DIAGNOSIS — Z8551 Personal history of malignant neoplasm of bladder: Secondary | ICD-10-CM | POA: Diagnosis not present

## 2024-05-09 DIAGNOSIS — D649 Anemia, unspecified: Secondary | ICD-10-CM | POA: Diagnosis not present

## 2024-05-09 DIAGNOSIS — E782 Mixed hyperlipidemia: Secondary | ICD-10-CM | POA: Diagnosis not present

## 2024-05-09 DIAGNOSIS — M4802 Spinal stenosis, cervical region: Secondary | ICD-10-CM | POA: Diagnosis not present

## 2024-05-09 DIAGNOSIS — E1122 Type 2 diabetes mellitus with diabetic chronic kidney disease: Secondary | ICD-10-CM | POA: Diagnosis not present

## 2024-05-09 DIAGNOSIS — N1832 Chronic kidney disease, stage 3b: Secondary | ICD-10-CM | POA: Diagnosis not present

## 2024-05-09 DIAGNOSIS — I129 Hypertensive chronic kidney disease with stage 1 through stage 4 chronic kidney disease, or unspecified chronic kidney disease: Secondary | ICD-10-CM | POA: Diagnosis not present

## 2024-05-09 DIAGNOSIS — F339 Major depressive disorder, recurrent, unspecified: Secondary | ICD-10-CM | POA: Diagnosis not present

## 2024-05-09 DIAGNOSIS — R339 Retention of urine, unspecified: Secondary | ICD-10-CM | POA: Diagnosis not present

## 2024-05-16 DIAGNOSIS — R809 Proteinuria, unspecified: Secondary | ICD-10-CM | POA: Diagnosis not present

## 2024-05-16 DIAGNOSIS — I129 Hypertensive chronic kidney disease with stage 1 through stage 4 chronic kidney disease, or unspecified chronic kidney disease: Secondary | ICD-10-CM | POA: Diagnosis not present

## 2024-05-16 DIAGNOSIS — N1831 Chronic kidney disease, stage 3a: Secondary | ICD-10-CM | POA: Diagnosis not present

## 2024-05-20 DIAGNOSIS — G4733 Obstructive sleep apnea (adult) (pediatric): Secondary | ICD-10-CM | POA: Diagnosis not present

## 2024-05-30 DIAGNOSIS — H35352 Cystoid macular degeneration, left eye: Secondary | ICD-10-CM | POA: Diagnosis not present

## 2024-05-30 DIAGNOSIS — H31092 Other chorioretinal scars, left eye: Secondary | ICD-10-CM | POA: Diagnosis not present

## 2024-05-30 DIAGNOSIS — H35372 Puckering of macula, left eye: Secondary | ICD-10-CM | POA: Diagnosis not present

## 2024-05-30 DIAGNOSIS — H30042 Focal chorioretinal inflammation, macular or paramacular, left eye: Secondary | ICD-10-CM | POA: Diagnosis not present

## 2024-06-06 DIAGNOSIS — E663 Overweight: Secondary | ICD-10-CM | POA: Diagnosis not present

## 2024-06-06 DIAGNOSIS — R030 Elevated blood-pressure reading, without diagnosis of hypertension: Secondary | ICD-10-CM | POA: Diagnosis not present

## 2024-06-06 DIAGNOSIS — J029 Acute pharyngitis, unspecified: Secondary | ICD-10-CM | POA: Diagnosis not present

## 2024-06-06 DIAGNOSIS — J019 Acute sinusitis, unspecified: Secondary | ICD-10-CM | POA: Diagnosis not present

## 2024-06-06 DIAGNOSIS — Z6825 Body mass index (BMI) 25.0-25.9, adult: Secondary | ICD-10-CM | POA: Diagnosis not present

## 2024-06-20 DIAGNOSIS — G4733 Obstructive sleep apnea (adult) (pediatric): Secondary | ICD-10-CM | POA: Diagnosis not present

## 2024-06-23 DIAGNOSIS — R339 Retention of urine, unspecified: Secondary | ICD-10-CM | POA: Diagnosis not present

## 2024-07-13 DIAGNOSIS — H31092 Other chorioretinal scars, left eye: Secondary | ICD-10-CM | POA: Diagnosis not present

## 2024-07-13 DIAGNOSIS — H35372 Puckering of macula, left eye: Secondary | ICD-10-CM | POA: Diagnosis not present

## 2024-07-13 DIAGNOSIS — H30042 Focal chorioretinal inflammation, macular or paramacular, left eye: Secondary | ICD-10-CM | POA: Diagnosis not present

## 2024-07-13 DIAGNOSIS — H35352 Cystoid macular degeneration, left eye: Secondary | ICD-10-CM | POA: Diagnosis not present

## 2024-07-21 ENCOUNTER — Telehealth: Payer: Self-pay

## 2024-07-21 DIAGNOSIS — R339 Retention of urine, unspecified: Secondary | ICD-10-CM | POA: Diagnosis not present

## 2024-07-21 NOTE — Telephone Encounter (Signed)
 Up to date on meds, next review in August

## 2024-07-27 DIAGNOSIS — C672 Malignant neoplasm of lateral wall of bladder: Secondary | ICD-10-CM | POA: Diagnosis not present

## 2024-07-27 DIAGNOSIS — R339 Retention of urine, unspecified: Secondary | ICD-10-CM | POA: Diagnosis not present

## 2024-07-27 DIAGNOSIS — G20A1 Parkinson's disease without dyskinesia, without mention of fluctuations: Secondary | ICD-10-CM | POA: Diagnosis not present

## 2024-09-04 DIAGNOSIS — N1831 Chronic kidney disease, stage 3a: Secondary | ICD-10-CM | POA: Diagnosis not present

## 2024-09-04 DIAGNOSIS — I129 Hypertensive chronic kidney disease with stage 1 through stage 4 chronic kidney disease, or unspecified chronic kidney disease: Secondary | ICD-10-CM | POA: Diagnosis not present

## 2024-09-07 DIAGNOSIS — R600 Localized edema: Secondary | ICD-10-CM | POA: Diagnosis not present

## 2024-09-07 DIAGNOSIS — I129 Hypertensive chronic kidney disease with stage 1 through stage 4 chronic kidney disease, or unspecified chronic kidney disease: Secondary | ICD-10-CM | POA: Diagnosis not present

## 2024-09-07 DIAGNOSIS — R801 Persistent proteinuria, unspecified: Secondary | ICD-10-CM | POA: Diagnosis not present

## 2024-09-07 DIAGNOSIS — R338 Other retention of urine: Secondary | ICD-10-CM | POA: Diagnosis not present

## 2024-09-07 DIAGNOSIS — N1832 Chronic kidney disease, stage 3b: Secondary | ICD-10-CM | POA: Diagnosis not present

## 2024-09-15 DIAGNOSIS — E559 Vitamin D deficiency, unspecified: Secondary | ICD-10-CM | POA: Diagnosis not present

## 2024-09-15 DIAGNOSIS — N1832 Chronic kidney disease, stage 3b: Secondary | ICD-10-CM | POA: Diagnosis not present

## 2024-09-15 DIAGNOSIS — E118 Type 2 diabetes mellitus with unspecified complications: Secondary | ICD-10-CM | POA: Diagnosis not present

## 2024-09-15 DIAGNOSIS — D649 Anemia, unspecified: Secondary | ICD-10-CM | POA: Diagnosis not present

## 2024-09-15 DIAGNOSIS — E782 Mixed hyperlipidemia: Secondary | ICD-10-CM | POA: Diagnosis not present

## 2024-09-19 DIAGNOSIS — E663 Overweight: Secondary | ICD-10-CM | POA: Diagnosis not present

## 2024-09-19 DIAGNOSIS — E785 Hyperlipidemia, unspecified: Secondary | ICD-10-CM | POA: Diagnosis not present

## 2024-09-19 DIAGNOSIS — G4733 Obstructive sleep apnea (adult) (pediatric): Secondary | ICD-10-CM | POA: Diagnosis not present

## 2024-09-19 DIAGNOSIS — I10 Essential (primary) hypertension: Secondary | ICD-10-CM | POA: Diagnosis not present

## 2024-09-19 DIAGNOSIS — Z87891 Personal history of nicotine dependence: Secondary | ICD-10-CM | POA: Diagnosis not present

## 2024-09-19 DIAGNOSIS — M1990 Unspecified osteoarthritis, unspecified site: Secondary | ICD-10-CM | POA: Diagnosis not present

## 2024-09-28 DIAGNOSIS — E782 Mixed hyperlipidemia: Secondary | ICD-10-CM | POA: Diagnosis not present

## 2024-09-28 DIAGNOSIS — E663 Overweight: Secondary | ICD-10-CM | POA: Diagnosis not present

## 2024-09-28 DIAGNOSIS — I10 Essential (primary) hypertension: Secondary | ICD-10-CM | POA: Diagnosis not present

## 2024-09-28 DIAGNOSIS — N1832 Chronic kidney disease, stage 3b: Secondary | ICD-10-CM | POA: Diagnosis not present

## 2024-09-28 DIAGNOSIS — Z23 Encounter for immunization: Secondary | ICD-10-CM | POA: Diagnosis not present

## 2024-09-28 DIAGNOSIS — D649 Anemia, unspecified: Secondary | ICD-10-CM | POA: Diagnosis not present

## 2024-09-28 DIAGNOSIS — S72142D Displaced intertrochanteric fracture of left femur, subsequent encounter for closed fracture with routine healing: Secondary | ICD-10-CM | POA: Diagnosis not present

## 2024-09-28 DIAGNOSIS — Z0001 Encounter for general adult medical examination with abnormal findings: Secondary | ICD-10-CM | POA: Diagnosis not present

## 2024-09-28 DIAGNOSIS — R339 Retention of urine, unspecified: Secondary | ICD-10-CM | POA: Diagnosis not present

## 2024-09-28 DIAGNOSIS — I129 Hypertensive chronic kidney disease with stage 1 through stage 4 chronic kidney disease, or unspecified chronic kidney disease: Secondary | ICD-10-CM | POA: Diagnosis not present

## 2024-09-28 DIAGNOSIS — Z0000 Encounter for general adult medical examination without abnormal findings: Secondary | ICD-10-CM | POA: Diagnosis not present

## 2024-09-28 DIAGNOSIS — F332 Major depressive disorder, recurrent severe without psychotic features: Secondary | ICD-10-CM | POA: Diagnosis not present

## 2024-10-05 DIAGNOSIS — C672 Malignant neoplasm of lateral wall of bladder: Secondary | ICD-10-CM | POA: Diagnosis not present

## 2024-10-10 DIAGNOSIS — M6281 Muscle weakness (generalized): Secondary | ICD-10-CM | POA: Diagnosis not present

## 2024-10-10 DIAGNOSIS — Z9181 History of falling: Secondary | ICD-10-CM | POA: Diagnosis not present

## 2024-10-10 DIAGNOSIS — R2681 Unsteadiness on feet: Secondary | ICD-10-CM | POA: Diagnosis not present

## 2024-10-11 ENCOUNTER — Encounter (INDEPENDENT_AMBULATORY_CARE_PROVIDER_SITE_OTHER): Payer: Self-pay | Admitting: Gastroenterology

## 2024-10-12 DIAGNOSIS — Z9181 History of falling: Secondary | ICD-10-CM | POA: Diagnosis not present

## 2024-10-12 DIAGNOSIS — R2681 Unsteadiness on feet: Secondary | ICD-10-CM | POA: Diagnosis not present

## 2024-10-12 DIAGNOSIS — M6281 Muscle weakness (generalized): Secondary | ICD-10-CM | POA: Diagnosis not present

## 2024-10-13 DIAGNOSIS — M4802 Spinal stenosis, cervical region: Secondary | ICD-10-CM | POA: Diagnosis not present

## 2024-10-16 DIAGNOSIS — R2681 Unsteadiness on feet: Secondary | ICD-10-CM | POA: Diagnosis not present

## 2024-10-16 DIAGNOSIS — Z9181 History of falling: Secondary | ICD-10-CM | POA: Diagnosis not present

## 2024-10-16 DIAGNOSIS — M6281 Muscle weakness (generalized): Secondary | ICD-10-CM | POA: Diagnosis not present

## 2024-10-18 DIAGNOSIS — M6281 Muscle weakness (generalized): Secondary | ICD-10-CM | POA: Diagnosis not present

## 2024-10-18 DIAGNOSIS — Z9181 History of falling: Secondary | ICD-10-CM | POA: Diagnosis not present

## 2024-10-18 DIAGNOSIS — R2681 Unsteadiness on feet: Secondary | ICD-10-CM | POA: Diagnosis not present

## 2024-10-23 DIAGNOSIS — M6281 Muscle weakness (generalized): Secondary | ICD-10-CM | POA: Diagnosis not present

## 2024-10-23 DIAGNOSIS — R2681 Unsteadiness on feet: Secondary | ICD-10-CM | POA: Diagnosis not present

## 2024-10-23 DIAGNOSIS — Z9181 History of falling: Secondary | ICD-10-CM | POA: Diagnosis not present

## 2024-10-26 DIAGNOSIS — M6281 Muscle weakness (generalized): Secondary | ICD-10-CM | POA: Diagnosis not present

## 2024-10-26 DIAGNOSIS — R2681 Unsteadiness on feet: Secondary | ICD-10-CM | POA: Diagnosis not present

## 2024-10-26 DIAGNOSIS — Z9181 History of falling: Secondary | ICD-10-CM | POA: Diagnosis not present

## 2024-10-30 DIAGNOSIS — I739 Peripheral vascular disease, unspecified: Secondary | ICD-10-CM | POA: Diagnosis not present

## 2024-10-30 DIAGNOSIS — M79674 Pain in right toe(s): Secondary | ICD-10-CM | POA: Diagnosis not present

## 2024-10-30 DIAGNOSIS — B351 Tinea unguium: Secondary | ICD-10-CM | POA: Diagnosis not present

## 2024-10-30 DIAGNOSIS — M79675 Pain in left toe(s): Secondary | ICD-10-CM | POA: Diagnosis not present

## 2024-11-01 DIAGNOSIS — Z9181 History of falling: Secondary | ICD-10-CM | POA: Diagnosis not present

## 2024-11-01 DIAGNOSIS — R2681 Unsteadiness on feet: Secondary | ICD-10-CM | POA: Diagnosis not present

## 2024-11-01 DIAGNOSIS — M6281 Muscle weakness (generalized): Secondary | ICD-10-CM | POA: Diagnosis not present

## 2024-11-06 DIAGNOSIS — R2681 Unsteadiness on feet: Secondary | ICD-10-CM | POA: Diagnosis not present

## 2024-11-06 DIAGNOSIS — M6281 Muscle weakness (generalized): Secondary | ICD-10-CM | POA: Diagnosis not present

## 2024-11-06 DIAGNOSIS — Z9181 History of falling: Secondary | ICD-10-CM | POA: Diagnosis not present

## 2024-11-08 DIAGNOSIS — R2681 Unsteadiness on feet: Secondary | ICD-10-CM | POA: Diagnosis not present

## 2024-11-08 DIAGNOSIS — M6281 Muscle weakness (generalized): Secondary | ICD-10-CM | POA: Diagnosis not present

## 2024-11-08 DIAGNOSIS — Z9181 History of falling: Secondary | ICD-10-CM | POA: Diagnosis not present

## 2024-11-14 DIAGNOSIS — M6281 Muscle weakness (generalized): Secondary | ICD-10-CM | POA: Diagnosis not present

## 2024-11-14 DIAGNOSIS — R2681 Unsteadiness on feet: Secondary | ICD-10-CM | POA: Diagnosis not present

## 2024-11-14 DIAGNOSIS — Z9181 History of falling: Secondary | ICD-10-CM | POA: Diagnosis not present

## 2024-11-15 ENCOUNTER — Other Ambulatory Visit (HOSPITAL_COMMUNITY): Payer: Self-pay | Admitting: Psychiatry

## 2024-11-15 NOTE — Telephone Encounter (Signed)
 Call for appt

## 2024-11-16 DIAGNOSIS — Z9181 History of falling: Secondary | ICD-10-CM | POA: Diagnosis not present

## 2024-11-16 DIAGNOSIS — R2681 Unsteadiness on feet: Secondary | ICD-10-CM | POA: Diagnosis not present

## 2024-11-16 DIAGNOSIS — M6281 Muscle weakness (generalized): Secondary | ICD-10-CM | POA: Diagnosis not present

## 2024-11-20 DIAGNOSIS — R2681 Unsteadiness on feet: Secondary | ICD-10-CM | POA: Diagnosis not present

## 2024-11-20 DIAGNOSIS — M6281 Muscle weakness (generalized): Secondary | ICD-10-CM | POA: Diagnosis not present

## 2024-11-20 DIAGNOSIS — Z9181 History of falling: Secondary | ICD-10-CM | POA: Diagnosis not present

## 2024-11-28 DIAGNOSIS — C672 Malignant neoplasm of lateral wall of bladder: Secondary | ICD-10-CM | POA: Diagnosis not present

## 2024-11-29 DIAGNOSIS — M6281 Muscle weakness (generalized): Secondary | ICD-10-CM | POA: Diagnosis not present

## 2024-11-29 DIAGNOSIS — Z9181 History of falling: Secondary | ICD-10-CM | POA: Diagnosis not present

## 2024-11-29 DIAGNOSIS — R2681 Unsteadiness on feet: Secondary | ICD-10-CM | POA: Diagnosis not present

## 2024-12-04 DIAGNOSIS — R2681 Unsteadiness on feet: Secondary | ICD-10-CM | POA: Diagnosis not present

## 2024-12-04 DIAGNOSIS — M6281 Muscle weakness (generalized): Secondary | ICD-10-CM | POA: Diagnosis not present

## 2024-12-04 DIAGNOSIS — Z9181 History of falling: Secondary | ICD-10-CM | POA: Diagnosis not present

## 2024-12-09 ENCOUNTER — Emergency Department (HOSPITAL_COMMUNITY)

## 2024-12-09 ENCOUNTER — Other Ambulatory Visit: Payer: Self-pay

## 2024-12-09 ENCOUNTER — Encounter (HOSPITAL_COMMUNITY): Payer: Self-pay

## 2024-12-09 ENCOUNTER — Inpatient Hospital Stay (HOSPITAL_COMMUNITY)
Admission: EM | Admit: 2024-12-09 | Discharge: 2024-12-19 | DRG: 448 | Disposition: A | Discharge status: Skilled Nursing Facility

## 2024-12-09 DIAGNOSIS — W19XXXA Unspecified fall, initial encounter: Principal | ICD-10-CM

## 2024-12-09 DIAGNOSIS — Z87891 Personal history of nicotine dependence: Secondary | ICD-10-CM

## 2024-12-09 DIAGNOSIS — G8929 Other chronic pain: Secondary | ICD-10-CM | POA: Diagnosis present

## 2024-12-09 DIAGNOSIS — R339 Retention of urine, unspecified: Secondary | ICD-10-CM | POA: Diagnosis present

## 2024-12-09 DIAGNOSIS — I129 Hypertensive chronic kidney disease with stage 1 through stage 4 chronic kidney disease, or unspecified chronic kidney disease: Secondary | ICD-10-CM | POA: Diagnosis present

## 2024-12-09 DIAGNOSIS — Y92014 Private driveway to single-family (private) house as the place of occurrence of the external cause: Secondary | ICD-10-CM

## 2024-12-09 DIAGNOSIS — W010XXA Fall on same level from slipping, tripping and stumbling without subsequent striking against object, initial encounter: Secondary | ICD-10-CM | POA: Diagnosis present

## 2024-12-09 DIAGNOSIS — F419 Anxiety disorder, unspecified: Secondary | ICD-10-CM | POA: Diagnosis present

## 2024-12-09 DIAGNOSIS — Z8551 Personal history of malignant neoplasm of bladder: Secondary | ICD-10-CM

## 2024-12-09 DIAGNOSIS — Z87442 Personal history of urinary calculi: Secondary | ICD-10-CM

## 2024-12-09 DIAGNOSIS — Z825 Family history of asthma and other chronic lower respiratory diseases: Secondary | ICD-10-CM

## 2024-12-09 DIAGNOSIS — Z882 Allergy status to sulfonamides status: Secondary | ICD-10-CM

## 2024-12-09 DIAGNOSIS — M1990 Unspecified osteoarthritis, unspecified site: Secondary | ICD-10-CM | POA: Diagnosis present

## 2024-12-09 DIAGNOSIS — I10 Essential (primary) hypertension: Secondary | ICD-10-CM

## 2024-12-09 DIAGNOSIS — E782 Mixed hyperlipidemia: Secondary | ICD-10-CM | POA: Diagnosis present

## 2024-12-09 DIAGNOSIS — M532X4 Spinal instabilities, thoracic region: Secondary | ICD-10-CM | POA: Diagnosis present

## 2024-12-09 DIAGNOSIS — M25522 Pain in left elbow: Secondary | ICD-10-CM | POA: Diagnosis present

## 2024-12-09 DIAGNOSIS — K5900 Constipation, unspecified: Secondary | ICD-10-CM | POA: Diagnosis not present

## 2024-12-09 DIAGNOSIS — M48061 Spinal stenosis, lumbar region without neurogenic claudication: Secondary | ICD-10-CM | POA: Diagnosis present

## 2024-12-09 DIAGNOSIS — Y9301 Activity, walking, marching and hiking: Secondary | ICD-10-CM | POA: Diagnosis present

## 2024-12-09 DIAGNOSIS — S22078A Other fracture of T9-T10 vertebra, initial encounter for closed fracture: Principal | ICD-10-CM | POA: Diagnosis present

## 2024-12-09 DIAGNOSIS — Z8249 Family history of ischemic heart disease and other diseases of the circulatory system: Secondary | ICD-10-CM

## 2024-12-09 DIAGNOSIS — N183 Chronic kidney disease, stage 3 unspecified: Secondary | ICD-10-CM

## 2024-12-09 DIAGNOSIS — S233XXA Sprain of ligaments of thoracic spine, initial encounter: Secondary | ICD-10-CM | POA: Diagnosis present

## 2024-12-09 DIAGNOSIS — F32A Depression, unspecified: Secondary | ICD-10-CM | POA: Diagnosis present

## 2024-12-09 DIAGNOSIS — G20C Parkinsonism, unspecified: Secondary | ICD-10-CM | POA: Diagnosis present

## 2024-12-09 DIAGNOSIS — M5104 Intervertebral disc disorders with myelopathy, thoracic region: Secondary | ICD-10-CM | POA: Diagnosis present

## 2024-12-09 DIAGNOSIS — S23143A Dislocation of T7/T8 thoracic vertebra, initial encounter: Secondary | ICD-10-CM | POA: Diagnosis present

## 2024-12-09 DIAGNOSIS — Z981 Arthrodesis status: Secondary | ICD-10-CM

## 2024-12-09 DIAGNOSIS — Z79899 Other long term (current) drug therapy: Secondary | ICD-10-CM

## 2024-12-09 DIAGNOSIS — Z818 Family history of other mental and behavioral disorders: Secondary | ICD-10-CM

## 2024-12-09 DIAGNOSIS — S22079A Unspecified fracture of T9-T10 vertebra, initial encounter for closed fracture: Secondary | ICD-10-CM | POA: Diagnosis present

## 2024-12-09 DIAGNOSIS — G4733 Obstructive sleep apnea (adult) (pediatric): Secondary | ICD-10-CM | POA: Diagnosis present

## 2024-12-09 DIAGNOSIS — N1832 Chronic kidney disease, stage 3b: Secondary | ICD-10-CM | POA: Diagnosis present

## 2024-12-09 DIAGNOSIS — Z96652 Presence of left artificial knee joint: Secondary | ICD-10-CM | POA: Diagnosis present

## 2024-12-09 DIAGNOSIS — I160 Hypertensive urgency: Secondary | ICD-10-CM | POA: Diagnosis present

## 2024-12-09 LAB — CBC
HCT: 41.5 % (ref 39.0–52.0)
Hemoglobin: 13.5 g/dL (ref 13.0–17.0)
MCH: 29.9 pg (ref 26.0–34.0)
MCHC: 32.5 g/dL (ref 30.0–36.0)
MCV: 91.8 fL (ref 80.0–100.0)
Platelets: 253 K/uL (ref 150–400)
RBC: 4.52 MIL/uL (ref 4.22–5.81)
RDW: 13.8 % (ref 11.5–15.5)
WBC: 12.4 K/uL — ABNORMAL HIGH (ref 4.0–10.5)
nRBC: 0 % (ref 0.0–0.2)

## 2024-12-09 MED ORDER — HYDROMORPHONE HCL 1 MG/ML IJ SOLN
1.0000 mg | Freq: Once | INTRAMUSCULAR | Status: AC
  Administered 2024-12-10: 1 mg via INTRAVENOUS
  Filled 2024-12-09: qty 1

## 2024-12-09 MED ORDER — HYDROMORPHONE HCL 1 MG/ML IJ SOLN
1.0000 mg | Freq: Once | INTRAMUSCULAR | Status: AC
  Administered 2024-12-09: 1 mg via INTRAVENOUS
  Filled 2024-12-09: qty 1

## 2024-12-09 MED ORDER — ONDANSETRON HCL 4 MG/2ML IJ SOLN
4.0000 mg | Freq: Once | INTRAMUSCULAR | Status: AC
  Administered 2024-12-09: 4 mg via INTRAVENOUS
  Filled 2024-12-09: qty 2

## 2024-12-09 NOTE — ED Triage Notes (Signed)
 Pt bib EMS from home after tripping walking across gravel and fell backwards. Pt reports pain in lower neck and in between shoulders, as well as lower back. Pt severe on palpation. 170/90 BP for EMS. Other VSS. No palpable deformities. Pt states he hit his head, (-) Loc, (-) thinners.

## 2024-12-09 NOTE — Progress Notes (Signed)
 Patient ID: Zachary [name], male   DOB: [birth year], 78 y.o.   MRN: [id number] BP (!) 180/104 (BP Location: Left Arm)   Pulse 97   Temp (!) 97.5 F (36.4 C) (Oral)   Resp 18   Ht 6' 1.5 (1.867 m)   Wt 94.3 kg   SpO2 93%   BMI 27.07 kg/m  Films reviewed. Thoracic nine fracture will need to be stabilized via instrumentation. Will consult after arrival at [facility]. This is not emergent and at the earliest would be performed tomorrow afternoon. [name] may eat.

## 2024-12-09 NOTE — ED Provider Notes (Signed)
 [place] EMERGENCY DEPARTMENT AT [facility] Provider Note   CSN: [id number] Arrival date & time: 12/09/24  2150     Patient presents with: Zachary [name] is a 78 y.o. male who presents to the ED for evaluation after a fall at home.  The patient states that he was exiting his vehicle and walking up to his residence when he slipped and fell onto his back and hit his head. The patient is currently complaining of pain to his mid back, between his shoulder blades, his neck, and the back of his head. The patient denies any loss of consciousness or anticoagulation use.  Patient denies any bowel or bladder incontinence since the event.  Patient denies any neurological deficits such as numbness or tingling in his extremities. The patient denies any nausea or vomiting. The patient is currently in a c-collar placed by EMS and in no acute distress.     Fall       Prior to Admission medications  Medication Sig Start Date End Date Taking? Authorizing Provider  acetaminophen  (TYLENOL ) 500 MG tablet Take 1,000 mg by mouth at bedtime as needed for moderate pain.    [provider]  bethanechol  (URECHOLINE ) 25 MG tablet Take 25 mg by mouth 3 (three) times daily. 08/18/23   [provider]  carvedilol  (COREG ) 3.125 MG tablet Take 3.125 mg by mouth 2 (two) times daily.    [provider]  dorzolamide-timolol (COSOPT) 2-0.5 % ophthalmic solution Place 1 drop into both eyes 2 (two) times daily. 01/02/23   [provider]  DULoxetine  (CYMBALTA ) 60 MG capsule Take 1 capsule (60 mg total) by mouth 2 (two) times daily. 11/15/24   Okey Barnie SAUNDERS, MD  HYDROcodone -acetaminophen  (NORCO/VICODIN) 5-325 MG tablet Take 1 tablet by mouth 3 (three) times daily as needed. 09/21/23   [provider]  latanoprost (XALATAN) 0.005 % ophthalmic solution Place 1 drop into both eyes at bedtime.    [provider]  Multiple Vitamin (MULTIVITAMIN) tablet Take 1  tablet by mouth daily.    [provider]  Multiple Vitamins-Minerals (PRESERVISION AREDS PO) Take by mouth.    [provider]  pravastatin  (PRAVACHOL ) 10 MG tablet Take 10 mg by mouth at bedtime. 01/23/20   [provider]  predniSONE  (STERAPRED UNI-PAK 48 TAB) 10 MG (48) TBPK tablet Take by mouth daily. 10 mg ds 12 days/ TAKE AS DIRECTED ON PACK 04/24/24   Margrette Taft BRAVO, MD  sodium bicarbonate  650 MG tablet Take 650 mg by mouth daily.    [provider]  tamsulosin  (FLOMAX ) 0.4 MG CAPS capsule Take 0.4 mg by mouth daily.    [provider]  valsartan  (DIOVAN ) 320 MG tablet Take 320 mg by mouth daily.    [provider]    Allergies: Poison oak extract, Sulfonamide derivatives, and Sulfa antibiotics    Review of Systems  Musculoskeletal:  Positive for back pain.    Updated Vital Signs BP (!) 180/104 (BP Location: Left Arm)   Pulse 97   Temp (!) 97.5 F (36.4 C) (Oral)   Resp 18   Ht 6' 1.5 (1.867 m)   Wt 94.3 kg   SpO2 93%   BMI 27.07 kg/m   Physical Exam Vitals and nursing note reviewed.  Constitutional:      General: He is not in acute distress.    Appearance: Normal appearance.  HENT:     Head: Normocephalic and atraumatic.  Eyes:  Extraocular Movements: Extraocular movements intact.     Conjunctiva/sclera: Conjunctivae normal.     Pupils: Pupils are equal, round, and reactive to light.  Neck:     Comments: Patient complaining of mild movement pain and spinous process tenderness.  No obvious deformity, crepitus, or loss of ROM. Cardiovascular:     Rate and Rhythm: Normal rate and regular rhythm.     Pulses: Normal pulses.          Radial pulses are 2+ on the right side and 2+ on the left side.  Pulmonary:     Effort: Pulmonary effort is normal. No respiratory distress.     Breath sounds: Normal breath sounds.     Comments: Patient has no difficulty speaking complete sentences. Chest:     Chest wall: No  deformity, tenderness or crepitus.  Abdominal:     General: Abdomen is flat.     Palpations: Abdomen is soft.     Tenderness: There is no abdominal tenderness.     Comments: No abdominal tenderness.  No distention.  Musculoskeletal:     Cervical back: Pain with movement and spinous process tenderness present.     Comments: Patient denies pain to lateral lower and upper extremities to palpation and on ROM. Patient complains of thoracic back pain on palpation and on movement.  Skin:    General: Skin is warm and dry.     Capillary Refill: Capillary refill takes less than 2 seconds.  Neurological:     General: No focal deficit present.     Mental Status: He is alert. Mental status is at baseline.     Comments: Patient alert and oriented.  Speech clear and appropriate.  No aphasia or dysarthria.   Cranial nerves III through XII intact: Motor strength 5-5 in all extremities with normal tone and no pronator drift. Patient was able to raise both legs without difficulty. Sensation intact to light touch in upper and lower extremities bilaterally.  Coordination normal with intact finger-nose. No focal neurologic deficits appreciated.   Psychiatric:        Mood and Affect: Mood normal.     (all labs ordered are listed, but only abnormal results are displayed) Labs Reviewed  CBC - Abnormal; Notable for the following components:      Result Value   WBC 12.4 (*)    All other components within normal limits  COMPREHENSIVE METABOLIC PANEL WITH GFR - Abnormal; Notable for the following components:   Glucose, Bld 122 (*)    BUN 28 (*)    Creatinine, Ser 2.04 (*)    GFR, Estimated 33 (*)    All other components within normal limits    EKG: None  Radiology: CT Thoracic Spine Wo Contrast Result Date: 12/09/2024 EXAM: CT THORACIC SPINE WITHOUT CONTRAST 12/09/2024 10:59:46 PM TECHNIQUE: CT of the thoracic spine was performed without the administration of intravenous contrast. Multiplanar  reformatted images are provided for review. Automated exposure control, iterative reconstruction, and/or weight based adjustment of the mA/kV was utilized to reduce the radiation dose to as low as reasonably achievable. COMPARISON: Thoracic spine MRI 01/20/2023 CLINICAL HISTORY: Back trauma, no prior imaging (Age >= 16y) FINDINGS: BONES AND ALIGNMENT: Normal vertebral body heights except at T9. At T9, there is an acute fracture that traverses the anterior and posterior walls and extends into both pedicles. There is hyperextension at this level. The fracture pattern is consistent with an AO Spine B3 hyperextension type injury. There is almost certainly a tear of the  anterior longitudinal ligament, which could be confirmed with MRI. Normal alignment otherwise. DEGENERATIVE CHANGES: There are flowing anterior osteophytes throughout the thoracic spine. SOFT TISSUES: No acute abnormality. IMPRESSION: 1. Acute T9 fracture with hyperextension, consistent with an AOSpine B3 injury, with probable anterior longitudinal ligament tear; recommend MRI to confirm the ligament injury and assess the spinal cord. 2. Discussed with Dr. Cleotilde at 11:34 pm on 12/09/2024 . Electronically signed by: Franky Stanford MD 12/09/2024 11:34 PM EST RP Workstation: [id number]   CT Lumbar Spine Wo Contrast Result Date: 12/09/2024 EXAM: CT OF THE LUMBAR SPINE WITHOUT CONTRAST 12/09/2024 10:59:46 PM TECHNIQUE: CT of the lumbar spine was performed without the administration of intravenous contrast. Multiplanar reformatted images are provided for review. Automated exposure control, iterative reconstruction, and/or weight based adjustment of the mA/kV was utilized to reduce the radiation dose to as low as reasonably achievable. COMPARISON: None available. CLINICAL HISTORY: Back trauma, no prior imaging (Age >= 16y). FINDINGS: BONES AND ALIGNMENT: Normal vertebral body heights. No acute fracture or suspicious bone lesion. Normal alignment. DEGENERATIVE  CHANGES: L3-L5 PLIF. Bulky ossific material of the dorsal spinal canal at the L2-L3 level causing severe stenosis. There is mild spinal canal stenosis at the L3-L4 level. Otherwise, the spinal canal is widely patent. Moderate bilateral L2 neural foraminal stenosis. Mild lucency along the right L3 transpedicular screw, which may indicate loosening. SOFT TISSUES: Calcific aortic atherosclerosis. Nonobstructing interpolar calculus of the right kidney measuring 4 mm. No acute abnormality. IMPRESSION: 1. 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 2. Mild lucency along the right L3 transpedicular screw, which may indicate loosening. Electronically signed by: Franky Stanford MD 12/09/2024 11:24 PM EST RP Workstation: [id number]   CT Cervical Spine Wo Contrast Result Date: 12/09/2024 EXAM: CT CERVICAL SPINE WITHOUT CONTRAST 12/09/2024 10:59:46 PM TECHNIQUE: CT of the cervical spine was performed without the administration of intravenous contrast. Multiplanar reformatted images are provided for review. Automated exposure control, iterative reconstruction, and/or weight based adjustment of the mA/kV was utilized to reduce the radiation dose to as low as reasonably achievable. COMPARISON: None available. CLINICAL HISTORY: Neck trauma (Age >= 65y) FINDINGS: BONES AND ALIGNMENT: C3-C4 ACDF. No acute fracture or traumatic malalignment. DEGENERATIVE CHANGES: Multilevel degenerative disc disease with no high-grade spinal canal stenosis. SOFT TISSUES: No prevertebral soft tissue swelling. IMPRESSION: 1. No acute findings. 2. C3-C4 ACDF. 3. Multilevel degenerative disc disease without high-grade spinal canal stenosis. Electronically signed by: Franky Stanford MD 12/09/2024 11:15 PM EST RP Workstation: [id number]   CT Head Wo Contrast Result Date: 12/09/2024 EXAM: CT HEAD WITHOUT 12/09/2024 10:59:46 PM TECHNIQUE: CT of the head was performed  without the administration of intravenous contrast. Automated exposure control, iterative reconstruction, and/or weight based adjustment of the mA/kV was utilized to reduce the radiation dose to as low as reasonably achievable. COMPARISON: 02/23/2023 CLINICAL HISTORY: Head trauma, minor (Age >= 65y); Trauma after fall FINDINGS: BRAIN AND VENTRICLES: No acute intracranial hemorrhage. No mass effect or midline shift. No extra-axial fluid collection. 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 No hydrocephalus. Mild cerebral atrophy. Patchy white matter hypodensities compatible with moderate chronic small vessel ischemic disease. Calcified atherosclerosis at skull base. ORBITS: Bilateral cataract extraction. SINUSES AND MASTOIDS: No acute abnormality. SOFT TISSUES AND SKULL: No acute skull fracture. No acute soft tissue abnormality. IMPRESSION: 1. No acute intracranial abnormality. 2. Mild cerebral atrophy, moderate chronic small vessel ischemic disease, and a small chronic lacunar infarct in  the right thalamus, unchanged from prior. Electronically signed by: Franky Stanford MD 12/09/2024 11:03 PM EST RP Workstation: [id number]     Procedures   Medications Ordered in the ED  HYDROmorphone  (DILAUDID ) injection 1 mg (1 mg Intravenous Given 12/09/24 2319)  ondansetron  (ZOFRAN ) injection 4 mg (4 mg Intravenous Given 12/09/24 2316)  HYDROmorphone  (DILAUDID ) injection 1 mg (1 mg Intravenous Given 12/10/24 0002)                                  Medical Decision Making Patient presents to the ED for: Evaluation after fall This involves an extensive number of treatment options and is a complaint that carries with it a high risk of complications  Differential diagnosis includes: Traumatic etiology-fracture/dislocation Minor MSK etiology Co-morbid conditions: Hypertension, prior back surgery  Additional history/records obtained and reviewed: Additional history obtained from  wife who is a good historian  to patients medical history   Clinical Course as of 12/10/24 0031  Sat Dec 09, 2024  2204 Temp(!): 97.5 F (36.4 C) Patient afebrile, vital stable, no acute distress. [ML]  2350 CT Head Wo Contrast No acute findings [ML]  2350 CT Cervical Spine Wo Contrast No acute findings [ML]  2351 CT Lumbar Spine Wo Contrast No acute findings [ML]  2351 CT Thoracic Spine Wo Contrast T9 fracture with hyperextension [ML]  2351 Neurosurgery consulted by Dr. Cleotilde at this time -neurosurgery to see patient, patient to be on strict spinal precautions until evaluation. [ML]  2351 Patient tolerated hydromorphone  1 mg, continues to have pain -another dose to be given for pain management  [ML]  Sun Dec 10, 2024  0012 Dr. Cleotilde spoke with hospitalist-to admit [ML]  0024 Comprehensive metabolic panel(!) No acute findings per patient's baseline [ML]  0024 CBC(!) Leukocytosis [ML]    Clinical Course User Index [ML] Willma Duwaine CROME, PA    Data Reviewed / Actions Taken: Labs ordered/reviewed with my independent interpretation in ED course above. Imaging ordered/reviewed with my independent interpretation in ED course above. I agree with the radiologists interpretation.  Key findings for the patient were reviewed with the attending physician, and ongoing clinical collaboration was maintained throughout the visit  Management / Treatments: See ED course above for medications, treatments administered, and clinical rationale.   Reevaluation of the patient after these medicines showed that the patient improved with pain management. I have reviewed the patients home medicines and have made adjustments as needed  Test Considered/Diagnostic tools:  MRI testing was considered based on the patients presenting symptoms, risk factors, and initial clinical assessment - patient to receive MRI at [facility] after discussion with neurosurgery.  ED Course / Reassessments: Problem List: fall, back pain 78 year old male  presented for evaluation after a fall. Initial assessment included history, physical exam, and review of prior medical records. Vital signs were obtained and monitored, and the patient remained stable throughout the stay.  Patient's pain was managed well with hydromorphone  administration.  Given imaging of T9 fracture and conversation with neurosurgery regarding further evaluation and care, patient to be admitted for MRI imaging, evaluation by neurosurgery, and will remain on strict spinal precautions until neurosurgery evaluation is complete.  Vital signs were obtained and monitored, and the patient remained stable throughout the stay in the emergency department. Serial reassessments performed: Yes    Consultations:  Neurosurgery - Dr. Gillie Consult recommendations incorporated into plan: Strict spinal precautions with transfer for MRI imaging and  further evaluation by neurosurgery team. Hospitalist- Dr. Lilli Consult recommendations incorporated into plan: Admission with neurosurgery follow. Disposition: Disposition: Admission  Rationale for disposition: Further evaluation and care of T9 fracture. The disposition plan and rationale were discussed with the patient at the bedside, all questions were addressed, and the patient demonstrated understanding.  This note was produced using Electronics Engineer. While I have reviewed and verified all clinical information, transcription errors may remain.     Final diagnoses:  None    ED Discharge Orders     None          Willma Duwaine CROME, [place] 12/10/24 [place]    Cleotilde Rogue, MD 12/10/24 [phone]

## 2024-12-09 NOTE — ED Provider Notes (Incomplete)
 [place] EMERGENCY DEPARTMENT AT [facility] Provider Note   CSN: [id number] Arrival date & time: 12/09/24  2150     Patient presents with: Zachary [name] is a 78 y.o. male with a history of *** presents to the ED with *** that began ***. The symptoms started *** and have been progressively *** since onset. The patient describes the symptoms as ***, located at ***, and rates the severity as ***. Associated symptoms include ***. The patient reports no ***. There is *** of prior episodes or relevant medical history, including ***. No recent travel. No sick contacts. The patients social history is notable for ***.    {Add pertinent medical, surgical, social history, OB history to YEP:67052}  Fall       Prior to Admission medications  Medication Sig Start Date End Date Taking? Authorizing Provider  acetaminophen  (TYLENOL ) 500 MG tablet Take 1,000 mg by mouth at bedtime as needed for moderate pain.    [provider]  bethanechol  (URECHOLINE ) 25 MG tablet Take 25 mg by mouth 3 (three) times daily. 08/18/23   [provider]  carvedilol  (COREG ) 3.125 MG tablet Take 3.125 mg by mouth 2 (two) times daily.    [provider]  dorzolamide-timolol (COSOPT) 2-0.5 % ophthalmic solution Place 1 drop into both eyes 2 (two) times daily. 01/02/23   [provider]  DULoxetine  (CYMBALTA ) 60 MG capsule Take 1 capsule (60 mg total) by mouth 2 (two) times daily. 11/15/24   Okey Barnie SAUNDERS, MD  HYDROcodone -acetaminophen  (NORCO/VICODIN) 5-325 MG tablet Take 1 tablet by mouth 3 (three) times daily as needed. 09/21/23   [provider]  latanoprost (XALATAN) 0.005 % ophthalmic solution Place 1 drop into both eyes at bedtime.    [provider]  Multiple Vitamin (MULTIVITAMIN) tablet Take 1 tablet by mouth daily.    [provider]  Multiple Vitamins-Minerals (PRESERVISION AREDS PO) Take by mouth.    [provider]   pravastatin  (PRAVACHOL ) 10 MG tablet Take 10 mg by mouth at bedtime. 01/23/20   [provider]  predniSONE  (STERAPRED UNI-PAK 48 TAB) 10 MG (48) TBPK tablet Take by mouth daily. 10 mg ds 12 days/ TAKE AS DIRECTED ON PACK 04/24/24   Margrette Taft BRAVO, MD  sodium bicarbonate  650 MG tablet Take 650 mg by mouth daily.    [provider]  tamsulosin  (FLOMAX ) 0.4 MG CAPS capsule Take 0.4 mg by mouth daily.    [provider]  valsartan  (DIOVAN ) 320 MG tablet Take 320 mg by mouth daily.    [provider]    Allergies: Poison oak extract, Sulfonamide derivatives, and Sulfa antibiotics    Review of Systems  Updated Vital Signs BP (!) 180/104 (BP Location: Left Arm)   Pulse 97   Temp (!) 97.5 F (36.4 C) (Oral)   Resp 18   Ht 6' 1.5 (1.867 m)   Wt 94.3 kg   SpO2 93%   BMI 27.07 kg/m   Physical Exam  (all labs ordered are listed, but only abnormal results are displayed) Labs Reviewed - No data to display  EKG: None  Radiology: No results found.  {Document cardiac monitor, telemetry assessment procedure when appropriate:32947} Procedures   Medications Ordered in the ED - No data to display  Clinical Course as of 12/09/24 2350  Sat Dec 09, 2024  2204 Temp(!): 97.5 F (36.4 C) Patient afebrile, vital stable, no acute distress. [ML]  2344 O2 Device(S): Nasal Cannula [  ML]    Clinical Course User Index [ML] Willma Duwaine CROME, PA   {Click here for ABCD2, HEART and other calculators REFRESH Note before signing:1}                              Medical Decision Making  ***  {Document critical care time when appropriate  Document review of labs and clinical decision tools ie [id number], etc  Document your independent review of radiology images and any outside records  Document your discussion with family members, caretakers and with consultants  Document social determinants of health affecting pt's care  Document your decision making why or  why not admission, treatments were needed:32947:::1}   Final diagnoses:  None    ED Discharge Orders     None

## 2024-12-10 DIAGNOSIS — I129 Hypertensive chronic kidney disease with stage 1 through stage 4 chronic kidney disease, or unspecified chronic kidney disease: Secondary | ICD-10-CM | POA: Diagnosis present

## 2024-12-10 DIAGNOSIS — W19XXXA Unspecified fall, initial encounter: Principal | ICD-10-CM

## 2024-12-10 DIAGNOSIS — S22078A Other fracture of T9-T10 vertebra, initial encounter for closed fracture: Secondary | ICD-10-CM

## 2024-12-10 DIAGNOSIS — S22079A Unspecified fracture of T9-T10 vertebra, initial encounter for closed fracture: Secondary | ICD-10-CM | POA: Diagnosis present

## 2024-12-10 DIAGNOSIS — E782 Mixed hyperlipidemia: Secondary | ICD-10-CM | POA: Diagnosis present

## 2024-12-10 DIAGNOSIS — F418 Other specified anxiety disorders: Secondary | ICD-10-CM | POA: Diagnosis not present

## 2024-12-10 DIAGNOSIS — Y92009 Unspecified place in unspecified non-institutional (private) residence as the place of occurrence of the external cause: Secondary | ICD-10-CM | POA: Diagnosis not present

## 2024-12-10 DIAGNOSIS — Y9301 Activity, walking, marching and hiking: Secondary | ICD-10-CM | POA: Diagnosis present

## 2024-12-10 DIAGNOSIS — S22072A Unstable burst fracture of T9-T10 vertebra, initial encounter for closed fracture: Secondary | ICD-10-CM | POA: Diagnosis not present

## 2024-12-10 DIAGNOSIS — F419 Anxiety disorder, unspecified: Secondary | ICD-10-CM | POA: Diagnosis present

## 2024-12-10 DIAGNOSIS — G4733 Obstructive sleep apnea (adult) (pediatric): Secondary | ICD-10-CM

## 2024-12-10 DIAGNOSIS — Y92014 Private driveway to single-family (private) house as the place of occurrence of the external cause: Secondary | ICD-10-CM | POA: Diagnosis not present

## 2024-12-10 DIAGNOSIS — S22078D Other fracture of T9-T10 vertebra, subsequent encounter for fracture with routine healing: Secondary | ICD-10-CM | POA: Diagnosis not present

## 2024-12-10 DIAGNOSIS — M25522 Pain in left elbow: Secondary | ICD-10-CM | POA: Diagnosis present

## 2024-12-10 DIAGNOSIS — N179 Acute kidney failure, unspecified: Secondary | ICD-10-CM

## 2024-12-10 DIAGNOSIS — I10 Essential (primary) hypertension: Secondary | ICD-10-CM | POA: Diagnosis not present

## 2024-12-10 DIAGNOSIS — W010XXA Fall on same level from slipping, tripping and stumbling without subsequent striking against object, initial encounter: Secondary | ICD-10-CM | POA: Diagnosis present

## 2024-12-10 DIAGNOSIS — R339 Retention of urine, unspecified: Secondary | ICD-10-CM | POA: Diagnosis present

## 2024-12-10 DIAGNOSIS — S233XXA Sprain of ligaments of thoracic spine, initial encounter: Secondary | ICD-10-CM | POA: Diagnosis present

## 2024-12-10 DIAGNOSIS — M549 Dorsalgia, unspecified: Secondary | ICD-10-CM | POA: Diagnosis present

## 2024-12-10 DIAGNOSIS — N1832 Chronic kidney disease, stage 3b: Secondary | ICD-10-CM | POA: Diagnosis present

## 2024-12-10 DIAGNOSIS — F32A Depression, unspecified: Secondary | ICD-10-CM | POA: Diagnosis present

## 2024-12-10 DIAGNOSIS — M48061 Spinal stenosis, lumbar region without neurogenic claudication: Secondary | ICD-10-CM | POA: Diagnosis present

## 2024-12-10 DIAGNOSIS — R531 Weakness: Secondary | ICD-10-CM | POA: Diagnosis not present

## 2024-12-10 DIAGNOSIS — M1990 Unspecified osteoarthritis, unspecified site: Secondary | ICD-10-CM | POA: Diagnosis present

## 2024-12-10 DIAGNOSIS — K5900 Constipation, unspecified: Secondary | ICD-10-CM | POA: Diagnosis not present

## 2024-12-10 DIAGNOSIS — Z8249 Family history of ischemic heart disease and other diseases of the circulatory system: Secondary | ICD-10-CM | POA: Diagnosis not present

## 2024-12-10 DIAGNOSIS — Z79899 Other long term (current) drug therapy: Secondary | ICD-10-CM | POA: Diagnosis not present

## 2024-12-10 DIAGNOSIS — Z7401 Bed confinement status: Secondary | ICD-10-CM | POA: Diagnosis not present

## 2024-12-10 DIAGNOSIS — I160 Hypertensive urgency: Secondary | ICD-10-CM

## 2024-12-10 DIAGNOSIS — S23143A Dislocation of T7/T8 thoracic vertebra, initial encounter: Secondary | ICD-10-CM | POA: Diagnosis present

## 2024-12-10 DIAGNOSIS — Z8551 Personal history of malignant neoplasm of bladder: Secondary | ICD-10-CM | POA: Diagnosis not present

## 2024-12-10 DIAGNOSIS — Z96652 Presence of left artificial knee joint: Secondary | ICD-10-CM | POA: Diagnosis present

## 2024-12-10 DIAGNOSIS — G8929 Other chronic pain: Secondary | ICD-10-CM | POA: Diagnosis present

## 2024-12-10 DIAGNOSIS — Z87891 Personal history of nicotine dependence: Secondary | ICD-10-CM | POA: Diagnosis not present

## 2024-12-10 DIAGNOSIS — M532X4 Spinal instabilities, thoracic region: Secondary | ICD-10-CM | POA: Diagnosis present

## 2024-12-10 DIAGNOSIS — N183 Chronic kidney disease, stage 3 unspecified: Secondary | ICD-10-CM

## 2024-12-10 DIAGNOSIS — Z882 Allergy status to sulfonamides status: Secondary | ICD-10-CM | POA: Diagnosis not present

## 2024-12-10 DIAGNOSIS — M5104 Intervertebral disc disorders with myelopathy, thoracic region: Secondary | ICD-10-CM | POA: Diagnosis present

## 2024-12-10 DIAGNOSIS — G20C Parkinsonism, unspecified: Secondary | ICD-10-CM | POA: Diagnosis present

## 2024-12-10 DIAGNOSIS — Z01818 Encounter for other preprocedural examination: Secondary | ICD-10-CM | POA: Diagnosis not present

## 2024-12-10 LAB — COMPREHENSIVE METABOLIC PANEL WITH GFR
ALT: 13 U/L (ref 0–44)
ALT: 13 U/L (ref 0–44)
AST: 26 U/L (ref 15–41)
AST: 21 U/L (ref 15–41)
Albumin: 4 g/dL (ref 3.5–5.0)
Albumin: 3.8 g/dL (ref 3.5–5.0)
Alkaline Phosphatase: 97 U/L (ref 38–126)
Alkaline Phosphatase: 102 U/L (ref 38–126)
Anion gap: 8 (ref 5–15)
Anion gap: 6 (ref 5–15)
BUN: 28 mg/dL — ABNORMAL HIGH (ref 8–23)
BUN: 26 mg/dL — ABNORMAL HIGH (ref 8–23)
CO2: 30 mmol/L (ref 22–32)
CO2: 33 mmol/L — ABNORMAL HIGH (ref 22–32)
Calcium: 9.1 mg/dL (ref 8.9–10.3)
Calcium: 8.7 mg/dL — ABNORMAL LOW (ref 8.9–10.3)
Chloride: 105 mmol/L (ref 98–111)
Chloride: 105 mmol/L (ref 98–111)
Creatinine, Ser: 2.04 mg/dL — ABNORMAL HIGH (ref 0.61–1.24)
Creatinine, Ser: 1.87 mg/dL — ABNORMAL HIGH (ref 0.61–1.24)
GFR, Estimated: 33 mL/min — ABNORMAL LOW (ref >60)
GFR, Estimated: 36 mL/min — ABNORMAL LOW (ref >60)
Glucose, Bld: 122 mg/dL — ABNORMAL HIGH (ref 70–99)
Glucose, Bld: 126 mg/dL — ABNORMAL HIGH (ref 70–99)
Potassium: 3.6 mmol/L (ref 3.5–5.1)
Potassium: 3.3 mmol/L — ABNORMAL LOW (ref 3.5–5.1)
Sodium: 142 mmol/L (ref 135–145)
Sodium: 144 mmol/L (ref 135–145)
Total Bilirubin: 0.3 mg/dL (ref 0.0–1.2)
Total Bilirubin: 0.3 mg/dL (ref 0.0–1.2)
Total Protein: 7 g/dL (ref 6.5–8.1)
Total Protein: 6.6 g/dL (ref 6.5–8.1)

## 2024-12-10 LAB — CBC
HCT: 41.2 % (ref 39.0–52.0)
Hemoglobin: 12.8 g/dL — ABNORMAL LOW (ref 13.0–17.0)
MCH: 29.2 pg (ref 26.0–34.0)
MCHC: 31.1 g/dL (ref 30.0–36.0)
MCV: 94.1 fL (ref 80.0–100.0)
Platelets: 228 K/uL (ref 150–400)
RBC: 4.38 MIL/uL (ref 4.22–5.81)
RDW: 13.7 % (ref 11.5–15.5)
WBC: 11.7 K/uL — ABNORMAL HIGH (ref 4.0–10.5)
nRBC: 0 % (ref 0.0–0.2)

## 2024-12-10 LAB — MAGNESIUM: Magnesium: 1.8 mg/dL (ref 1.7–2.4)

## 2024-12-10 LAB — PHOSPHORUS: Phosphorus: 4.2 mg/dL (ref 2.5–4.6)

## 2024-12-10 MED ORDER — ONDANSETRON HCL 4 MG/2ML IJ SOLN
4.0000 mg | Freq: Four times a day (QID) | INTRAMUSCULAR | Status: DC | PRN
  Administered 2024-12-13: 19:00:00 4 mg via INTRAVENOUS
  Filled 2024-12-10: qty 2

## 2024-12-10 MED ORDER — ONDANSETRON HCL 4 MG PO TABS: 4.0000 mg | ORAL_TABLET | Freq: Four times a day (QID) | ORAL | Status: DC | PRN

## 2024-12-10 MED ORDER — HYDROMORPHONE HCL 1 MG/ML IJ SOLN
0.5000 mg | INTRAMUSCULAR | Status: DC | PRN
  Administered 2024-12-10 (×4): 0.5 mg via INTRAVENOUS
  Filled 2024-12-10 (×4): qty 0.5

## 2024-12-10 MED ORDER — IRBESARTAN 150 MG PO TABS: 300.0000 mg | ORAL_TABLET | Freq: Every day | ORAL | Status: DC

## 2024-12-10 MED ORDER — TAMSULOSIN HCL 0.4 MG PO CAPS
0.4000 mg | ORAL_CAPSULE | Freq: Every day | ORAL | Status: DC
  Administered 2024-12-10 – 2024-12-19 (×10): 0.4 mg via ORAL
  Filled 2024-12-10 (×10): qty 1

## 2024-12-10 MED ORDER — POTASSIUM CHLORIDE 10 MEQ/100ML IV SOLN
10.0000 meq | INTRAVENOUS | Status: AC
  Administered 2024-12-10 (×2): 10 meq via INTRAVENOUS
  Filled 2024-12-10 (×2): qty 100

## 2024-12-10 MED ORDER — PRAVASTATIN SODIUM 10 MG PO TABS
10.0000 mg | ORAL_TABLET | Freq: Every day | ORAL | Status: DC
  Administered 2024-12-10 – 2024-12-18 (×9): 10 mg via ORAL
  Filled 2024-12-10 (×10): qty 1

## 2024-12-10 MED ORDER — ACETAMINOPHEN 650 MG RE SUPP: 650.0000 mg | Freq: Four times a day (QID) | RECTAL | Status: DC | PRN

## 2024-12-10 MED ORDER — ACETAMINOPHEN 325 MG PO TABS
650.0000 mg | ORAL_TABLET | Freq: Four times a day (QID) | ORAL | Status: DC | PRN
  Administered 2024-12-10 – 2024-12-18 (×4): 650 mg via ORAL
  Filled 2024-12-10 (×4): qty 2

## 2024-12-10 MED ORDER — CARVEDILOL 3.125 MG PO TABS
3.1250 mg | ORAL_TABLET | Freq: Two times a day (BID) | ORAL | Status: DC
  Administered 2024-12-10 – 2024-12-18 (×17): 3.125 mg via ORAL
  Filled 2024-12-10 (×17): qty 1

## 2024-12-10 MED ORDER — HYDRALAZINE HCL 20 MG/ML IJ SOLN
10.0000 mg | Freq: Four times a day (QID) | INTRAMUSCULAR | Status: DC | PRN
  Administered 2024-12-10: 10 mg via INTRAVENOUS
  Filled 2024-12-10: qty 1

## 2024-12-10 MED ORDER — LACTATED RINGERS IV SOLN: INTRAVENOUS | Status: AC

## 2024-12-10 MED ORDER — IRBESARTAN 150 MG PO TABS
150.0000 mg | ORAL_TABLET | Freq: Every day | ORAL | Status: DC
  Administered 2024-12-10 – 2024-12-19 (×9): 150 mg via ORAL
  Filled 2024-12-10 (×10): qty 1

## 2024-12-10 MED ORDER — DULOXETINE HCL 60 MG PO CPEP
60.0000 mg | ORAL_CAPSULE | Freq: Two times a day (BID) | ORAL | Status: DC
  Administered 2024-12-10 – 2024-12-19 (×19): 60 mg via ORAL
  Filled 2024-12-10 (×13): qty 1
  Filled 2024-12-10: qty 2
  Filled 2024-12-10 (×5): qty 1

## 2024-12-10 MED ORDER — METHOCARBAMOL 500 MG PO TABS
500.0000 mg | ORAL_TABLET | Freq: Three times a day (TID) | ORAL | Status: DC
  Administered 2024-12-10 – 2024-12-19 (×25): 500 mg via ORAL
  Filled 2024-12-10 (×26): qty 1

## 2024-12-10 MED ORDER — HYDROMORPHONE HCL 1 MG/ML IJ SOLN
1.0000 mg | INTRAMUSCULAR | Status: DC | PRN
  Administered 2024-12-10 – 2024-12-18 (×18): 1 mg via INTRAVENOUS
  Filled 2024-12-10 (×18): qty 1

## 2024-12-10 NOTE — ED Notes (Signed)
 Attempted to call wife to update her on pt transfer. No answer but nurse left a voicemail.  [facility] here to transfer pt.

## 2024-12-10 NOTE — ED Notes (Signed)
 Wife updated and asked to be called with any updates.

## 2024-12-10 NOTE — Hospital Course (Addendum)
 78 year old history of hypertension, CKD stage IIIb, bladder cancer status post multiple bladder surgeries and TURBT 12/2022, low-grade papillary urothelial carcinoma, anxiety/depression, prediabetes, urinary retention requiring chronic intermittent catheterizations presenting with mechanical fall. The patient states that he was exiting his vehicle and walking up to his residence when he slipped and fell onto his back and hit his head. Patient denies any neurological deficits such as numbness or tingling in his extremities   In the ED, the patient was afebrile and hemodynamically stable with oxygen  saturation 97% on 2 L.  WBC 12.4, hemoglobin 13.5, platelet 253.  Sodium 142, potassium 3.8, bicarbonate 30, serum creatinine 2.04.  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  EDP spoke with neurosurgery, Dr. Gillie.  He recommended  Strict spinal precautions with transfer for MRI imaging and further evaluation by neurosurgery team.   Notably, the patient has CT of the brain which was negative for acute findings.  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  CT cervical spine was negative for acute findings.

## 2024-12-10 NOTE — Progress Notes (Signed)
°   12/10/24 0945  TOC Brief Assessment  Insurance and Status Reviewed  Patient has primary care physician Yes  Home environment has been reviewed From Home  Prior level of function: Independent  Prior/Current Home Services No current home services  Social Drivers of Health Review SDOH reviewed no interventions necessary  Readmission risk has been reviewed Yes  Transition of care needs transition of care needs identified, TOC will continue to follow   Patient will likely need a PT eval due to fall - going to [facility]. ICM to follow there , if do not return

## 2024-12-10 NOTE — Progress Notes (Addendum)
 PROGRESS NOTE  Zachary [name] FMW:[id number] DOB: [birth year] DOA: 12/09/2024 PCP: Shona Norleen PEDLAR, MD  Brief History:  78 year old history of hypertension, CKD stage IIIb, bladder cancer status post multiple bladder surgeries and TURBT 12/2022, low-grade papillary urothelial carcinoma, anxiety/depression, prediabetes, urinary retention requiring chronic intermittent catheterizations presenting with mechanical fall. The patient states that he was exiting his vehicle and walking up to his residence when he slipped and fell onto his back and hit his head. Patient denies any neurological deficits such as numbness or tingling in his extremities   In the ED, the patient was afebrile and hemodynamically stable with oxygen  saturation 97% on 2 L.  WBC 12.4, hemoglobin 13.5, platelet 253.  Sodium 142, potassium 3.8, bicarbonate 30, serum creatinine 2.04.  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  EDP spoke with neurosurgery, Dr. Gillie.  He recommended  Strict spinal precautions with transfer for MRI imaging and further evaluation by neurosurgery team.    Assessment/Plan: Acute T9 vertebral fracture -12/09/2024 CT T-spine as discussed above -EDP spoke with neurosurgery, Dr. Gillie.  He recommended  Strict spinal precautions with transfer for MRI imaging and further evaluation by neurosurgery team.  - Judicious opioids  CKD stage IIIb - Baseline creatinine 1.8-2.0 - Patient follows with [facility]  Essential hypertension - Continue ARB home dose - Continue carvedilol  3.125 mg twice daily  Mixed hyperlipidemia - Continue statin  OSA -Continue CPAP (CPAP settings 4.5 per medical record)   Bladder cancer -s/p TURBT 12/2022 -Continue tamsulosin  - Follow-up [facility] urology  Urinary retention - Patient normally performs self catheterizations at home -  Foley catheter placed in the ED      Family Communication:   no Family at bedside  Consultants:  neurosurgery   Code Status:  FULL   DVT Prophylaxis:  SCD   Procedures: As Listed in Progress Note Above  Antibiotics: None      Subjective: Patient complains of back pain.  He denies any fevers, chills, chest pain, shortness of breath, coughing, nausea, vomiting, diarrhea, abdominal pain.  He denies any numbness or tingling in his hands or feet.  He denies any focal extremity weakness.  Objective: Vitals:   12/10/24 0436 12/10/24 0500 12/10/24 0530 12/10/24 0600  BP: (!) 181/88 (!) 171/80 (!) 153/83 (!) 153/80  Pulse:  (!) 109 (!) 112 (!) 117  Resp:  16 14 14   Temp:      TempSrc:      SpO2:  96% 94% 95%  Weight:      Height:        Intake/Output Summary (Last 24 hours) at 12/10/2024 9375 Last data filed at 12/10/2024 0515 Gross per 24 hour  Intake --  Output 1400 ml  Net -1400 ml   Weight change:  Exam:  General:  Pt is alert, follows commands appropriately, not in acute distress HEENT: No icterus, No thrush, No neck mass, [place]/AT Cardiovascular: RRR, S1/S2, no rubs, no gallops Respiratory: Fine fibrous crackles.  No wheezing Abdomen: Soft/+BS, non tender, non distended, no guarding Extremities: No edema, No lymphangitis, No petechiae, No rashes, no synovitis; able to move all 4 extremities antigravity.  Sensation intact in all 4 extremities.   Data Reviewed: I have personally reviewed following labs and imaging studies Basic Metabolic Panel: Recent Labs  Lab 12/09/24 2323  NA 142  K 3.6  CL 105  CO2 30  GLUCOSE 122*  BUN 28*  CREATININE 2.04*  CALCIUM 9.1   Liver Function Tests: Recent Labs  Lab 12/09/24 2323  AST 26  ALT 13  ALKPHOS 97  BILITOT 0.3  PROT 7.0  ALBUMIN  4.0   No results for input(s): LIPASE, AMYLASE in the last 168 hours. No results for input(s): AMMONIA in the last 168 hours. Coagulation Profile: No results for  input(s): INR, PROTIME in the last 168 hours. CBC: Recent Labs  Lab 12/09/24 2323 12/10/24 0601  WBC 12.4* 11.7*  HGB 13.5 12.8*  HCT 41.5 41.2  MCV 91.8 94.1  PLT 253 228   Cardiac Enzymes: No results for input(s): CKTOTAL, CKMB, CKMBINDEX, TROPONINI in the last 168 hours. BNP: Invalid input(s): POCBNP CBG: No results for input(s): GLUCAP in the last 168 hours. HbA1C: No results for input(s): HGBA1C in the last 72 hours. Urine analysis:    Component Value Date/Time   COLORURINE YELLOW 07/05/2023 1512   APPEARANCEUR CLEAR 07/05/2023 1512   LABSPEC 1.020 07/05/2023 1512   PHURINE 6.0 07/05/2023 1512   GLUCOSEU NEGATIVE 07/05/2023 1512   HGBUR NEGATIVE 07/05/2023 1512   BILIRUBINUR NEGATIVE 07/05/2023 1512   KETONESUR NEGATIVE 07/05/2023 1512   PROTEINUR >=300 (A) 07/05/2023 1512   UROBILINOGEN 0.2 06/01/2013 1516   NITRITE NEGATIVE 07/05/2023 1512   LEUKOCYTESUR NEGATIVE 07/05/2023 1512   Sepsis Labs: @LABRCNTIP (procalcitonin:4,lacticidven:4) )No results found for this or any previous visit (from the past 240 hours).   Scheduled Meds:  carvedilol   3.125 mg Oral BID   irbesartan   300 mg Oral Daily   pravastatin   10 mg Oral QHS   tamsulosin   0.4 mg Oral Daily   Continuous Infusions:  lactated ringers  100 mL/hr at 12/10/24 0307    Procedures/Studies: CT Thoracic Spine Wo Contrast Result Date: 12/09/2024 EXAM: CT THORACIC SPINE WITHOUT CONTRAST 12/09/2024 10:59:46 PM TECHNIQUE: CT of the thoracic spine was performed without the administration of intravenous contrast. Multiplanar reformatted images are provided for review. Automated exposure control, iterative reconstruction, and/or weight based adjustment of the mA/kV was utilized to reduce the radiation dose to as low as reasonably achievable. COMPARISON: Thoracic spine MRI 01/20/2023 CLINICAL HISTORY: Back trauma, no prior imaging (Age >= 16y) FINDINGS: BONES AND ALIGNMENT: Normal vertebral body  heights except at T9. At T9, there is an acute fracture that traverses the anterior and posterior walls and extends into both pedicles. There is hyperextension at this level. The fracture pattern is consistent with an AO Spine B3 hyperextension type injury. There is almost certainly a tear of the anterior longitudinal ligament, which could be confirmed with MRI. Normal alignment otherwise. DEGENERATIVE CHANGES: There are flowing anterior osteophytes throughout the thoracic spine. SOFT TISSUES: No acute abnormality. IMPRESSION: 1. Acute T9 fracture with hyperextension, consistent with an AOSpine B3 injury, with probable anterior longitudinal ligament tear; recommend MRI to confirm the ligament injury and assess the spinal cord. 2. Discussed with Dr. Cleotilde at 11:34 pm on 12/09/2024 . Electronically signed by: Franky Stanford MD 12/09/2024 11:34 PM EST RP Workstation: [id number]   CT Lumbar Spine Wo Contrast Result Date: 12/09/2024 EXAM: CT OF THE LUMBAR SPINE WITHOUT CONTRAST 12/09/2024 10:59:46 PM TECHNIQUE: CT of the lumbar spine was performed without the administration of intravenous contrast. Multiplanar reformatted images are provided for review. Automated exposure control, iterative reconstruction, and/or weight based adjustment of the mA/kV was utilized to reduce the radiation dose to as low as reasonably achievable. COMPARISON: None available. CLINICAL HISTORY: Back trauma, no  prior imaging (Age >= 16y). FINDINGS: BONES AND ALIGNMENT: Normal vertebral body heights. No acute fracture or suspicious bone lesion. Normal alignment. DEGENERATIVE CHANGES: L3-L5 PLIF. Bulky ossific material of the dorsal spinal canal at the L2-L3 level causing severe stenosis. There is mild spinal canal stenosis at the L3-L4 level. Otherwise, the spinal canal is widely patent. Moderate bilateral L2 neural foraminal stenosis. Mild lucency along the right L3 transpedicular screw, which may indicate loosening. SOFT TISSUES: Calcific  aortic atherosclerosis. Nonobstructing interpolar calculus of the right kidney measuring 4 mm. No acute abnormality. IMPRESSION: 1. 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 2. Mild lucency along the right L3 transpedicular screw, which may indicate loosening. Electronically signed by: Franky Stanford MD 12/09/2024 11:24 PM EST RP Workstation: [id number]   CT Cervical Spine Wo Contrast Result Date: 12/09/2024 EXAM: CT CERVICAL SPINE WITHOUT CONTRAST 12/09/2024 10:59:46 PM TECHNIQUE: CT of the cervical spine was performed without the administration of intravenous contrast. Multiplanar reformatted images are provided for review. Automated exposure control, iterative reconstruction, and/or weight based adjustment of the mA/kV was utilized to reduce the radiation dose to as low as reasonably achievable. COMPARISON: None available. CLINICAL HISTORY: Neck trauma (Age >= 65y) FINDINGS: BONES AND ALIGNMENT: C3-C4 ACDF. No acute fracture or traumatic malalignment. DEGENERATIVE CHANGES: Multilevel degenerative disc disease with no high-grade spinal canal stenosis. SOFT TISSUES: No prevertebral soft tissue swelling. IMPRESSION: 1. No acute findings. 2. C3-C4 ACDF. 3. Multilevel degenerative disc disease without high-grade spinal canal stenosis. Electronically signed by: Franky Stanford MD 12/09/2024 11:15 PM EST RP Workstation: [id number]   CT Head Wo Contrast Result Date: 12/09/2024 EXAM: CT HEAD WITHOUT 12/09/2024 10:59:46 PM TECHNIQUE: CT of the head was performed without the administration of intravenous contrast. Automated exposure control, iterative reconstruction, and/or weight based adjustment of the mA/kV was utilized to reduce the radiation dose to as low as reasonably achievable. COMPARISON: 02/23/2023 CLINICAL HISTORY: Head trauma, minor (Age >= 65y); Trauma after fall FINDINGS: BRAIN AND VENTRICLES: No acute intracranial  hemorrhage. No mass effect or midline shift. No extra-axial fluid collection. 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 No hydrocephalus. Mild cerebral atrophy. Patchy white matter hypodensities compatible with moderate chronic small vessel ischemic disease. Calcified atherosclerosis at skull base. ORBITS: Bilateral cataract extraction. SINUSES AND MASTOIDS: No acute abnormality. SOFT TISSUES AND SKULL: No acute skull fracture. No acute soft tissue abnormality. IMPRESSION: 1. No acute intracranial abnormality. 2. Mild cerebral atrophy, moderate chronic small vessel ischemic disease, and a small chronic lacunar infarct in the right thalamus, unchanged from prior. Electronically signed by: Franky Stanford MD 12/09/2024 11:03 PM EST RP Workstation: [id number]    Alm Schneider, DO  [facility]  If 7PM-7AM, please contact night-coverage [email or web address] Password [email or web address] 12/10/2024, 6:24 AM   LOS: 0 days

## 2024-12-10 NOTE — H&P (Signed)
 History and Physical    Patient: Zachary [name] FMW:[id number] DOB: 05-19-1946 DOA: 12/09/2024 DOS: the patient was seen and examined on 12/10/2024 PCP: Shona Norleen PEDLAR, MD  Patient coming from: Home  Chief Complaint:  Chief Complaint  Patient presents with   Fall   HPI: Zachary [name] is a 78 y.o. male with medical history significant of hypertension, OSA, chronic pain, anxiety, depression, arthritis who presents to the emergency department after a fall at home.  Patient normally walks with a walker, he just returned home with wife after dinner at a restaurant, so he decided to leave the walker in the car due to short distance from driveway to the house.  Patient fell backwards and hit the back of his head and complaining of pain in mid back, and neck, back (shoulder blades.  He denies loss of consciousness, numbness or tingling of extremities or nausea or vomiting.  ED course In the Emergency Department, temperature was 97.5 F, BP 180/104, other vital signs were within normal range.  Workup in the ED was significant for WBC of 12.4, BUN/creatinine 20/2.04 (baseline creatinine at 1.6 - 1.7). CT thoracic spine without contrast showed acute T9 fracture with hyperextension,  consistent with an AOSpine B3 injury, with probable anterior longitudinal ligament tear. CT without contrast showed no acute intracranial abnormality Dilaudid  and Zofran  were given. Neurosurgery (Dr. Gillie) was consulted and recommended admitting the patient to [facility] with plan to consult on patient on arrival to [facility].    Review of Systems: As mentioned in the history of present illness. All other systems reviewed and are negative. Past Medical History:  Diagnosis Date   Anxiety    takes Valium  as needed   Arthritis    Bladder cancer (HCC)    takes Rapaflo daily   Blood dyscrasia    07/08/16: pt told he was a free bleeder after bleeding a lot when derm cut off mole on forehead. No excessive bleeding with minor  wounds at home, no bleeding problems perioperatively with prior surgeries.   Chronic back pain    spondylolisthesis   Cluster headaches    Depression    takes Lexapro  daily   Essential hypertension, benign    takes Diovan -HCT daily   Hearing loss    hearing aids   History of kidney stones    Hx of complications due to general anesthesia    Aborted surgery 07/15/2016 due to hypotension per pt   Joint pain    Kidney stone 03/2019   passed stone   Obstructive sleep apnea on CPAP    uses CPAP @ night   Parkinsonism (HCC)    Pneumonia    several times--last time about 3-4 yrs ago   Pre-diabetes    Prediabetes    Syncopal episodes    Thyroid  condition    Past Surgical History:  Procedure Laterality Date   ANTERIOR CERVICAL DECOMP/DISCECTOMY FUSION N/A 06/05/2013   Procedure:  C3-4 Anterior Cervical Discectomy and Fusion, Allograft, Plate;  Surgeon: Oneil JAYSON Herald, MD;  Location: [facility] OR;  Service: Orthopedics;  Laterality: N/A;  C3-4 Anterior Cervical Discectomy and Fusion, Allograft, Plate   Arthroscopic knee surgery     BACK SURGERY      x 2   BLADDER SURGERY     x 5 to remove tumor   CARPAL TUNNEL RELEASE     CATARACT EXTRACTION W/PHACO  12/19/2012   Procedure: CATARACT EXTRACTION PHACO AND INTRAOCULAR LENS PLACEMENT (IOC);  Surgeon: Cherene Mania, MD;  Location:  [facility];  Service: Ophthalmology;  Laterality: Left;  CDE: 26.80   CATARACT EXTRACTION W/PHACO  01/09/2013   Procedure: CATARACT EXTRACTION PHACO AND INTRAOCULAR LENS PLACEMENT (IOC);  Surgeon: Cherene Mania, MD;  Location: [facility];  Service: Ophthalmology;  Laterality: Right;  CDE:22.83   CERVICAL FUSION  06/05/2013   C 3  C4   CYSTOSCOPY     CYSTOSCOPY W/ URETERAL STENT PLACEMENT Bilateral 02/12/2020   Procedure: CYSTOSCOPY WITH RETROGRADE PYELOGRAM/URETERAL STENT PLACEMENT;  Surgeon: Watt Rush, MD;  Location: [facility];  Service: Urology;  Laterality: Bilateral;   INTRAMEDULLARY (IM) NAIL INTERTROCHANTERIC Left 02/10/2023    Procedure: LEFT INTRAMEDULLARY (IM) NAILING OF LEFT FEMUR;  Surgeon: Kendal Franky SQUIBB, MD;  Location: [facility] OR;  Service: Orthopedics;  Laterality: Left;   LITHOTRIPSY  02/2020   REFRACTIVE SURGERY     Hx: of   RETINAL DETACHMENT SURGERY     TOTAL KNEE ARTHROPLASTY Left 02/01/2018   Procedure: TOTAL KNEE ARTHROPLASTY;  Surgeon: Margrette Taft BRAVO, MD;  Location: [facility];  Service: Orthopedics;  Laterality: Left;   TRANSURETHRAL RESECTION OF BLADDER TUMOR Right 07/27/2015   Procedure: TRANSURETHRAL RESECTION OF BLADDER TUMOR (TURBT);  Surgeon: Arlena Gal, MD;  Location: [facility];  Service: Urology;  Laterality: Right;   TRANSURETHRAL RESECTION OF BLADDER TUMOR WITH MITOMYCIN -C N/A 01/25/2023   Procedure: TRANSURETHRAL RESECTION OF BLADDER TUMOR WITH GEMCITABINE ;  Surgeon: Carolee Sherwood JONETTA DOUGLAS, MD;  Location: [facility];  Service: Urology;  Laterality: N/A;   wisdom tooth extraction     Social History:  reports that he quit smoking about 47 years ago. His smoking use included cigarettes and cigars. He started smoking about 48 years ago. He has a 0.3 pack-year smoking history. He has been exposed to tobacco smoke. He has never used smokeless tobacco. He reports that he does not drink alcohol  and does not use drugs.  Allergies[1]  Family History  Problem Relation Age of Onset   Hypertension Father    Cancer Father        Prostate   Depression Mother    Hypertension Mother    Alcohol  abuse Mother    COPD Mother        passed 07-08-17   Hypertension Sister    Anxiety disorder Sister    Alcohol  abuse Maternal Grandfather    ADD / ADHD Neg Hx    Bipolar disorder Neg Hx    Dementia Neg Hx    Drug abuse Neg Hx    OCD Neg Hx    Paranoid behavior Neg Hx    Schizophrenia Neg Hx    Seizures Neg Hx    Sexual abuse Neg Hx    Physical abuse Neg Hx     Prior to Admission medications  Medication Sig Start Date End Date Taking? Authorizing Provider  acetaminophen  (TYLENOL ) 500 MG tablet Take 1,000 mg  by mouth at bedtime as needed for moderate pain.    [provider]  bethanechol  (URECHOLINE ) 25 MG tablet Take 25 mg by mouth 3 (three) times daily. 08/18/23   [provider]  carvedilol  (COREG ) 3.125 MG tablet Take 3.125 mg by mouth 2 (two) times daily.    [provider]  dorzolamide-timolol (COSOPT) 2-0.5 % ophthalmic solution Place 1 drop into both eyes 2 (two) times daily. 01/02/23   [provider]  DULoxetine  (CYMBALTA ) 60 MG capsule Take 1 capsule (60 mg total) by mouth 2 (two) times daily. 11/15/24   Okey Barnie SAUNDERS, MD  HYDROcodone -acetaminophen  (NORCO/VICODIN) 5-325 MG  tablet Take 1 tablet by mouth 3 (three) times daily as needed. 09/21/23   [provider]  latanoprost (XALATAN) 0.005 % ophthalmic solution Place 1 drop into both eyes at bedtime.    [provider]  Multiple Vitamin (MULTIVITAMIN) tablet Take 1 tablet by mouth daily.    [provider]  Multiple Vitamins-Minerals (PRESERVISION AREDS PO) Take by mouth.    [provider]  pravastatin  (PRAVACHOL ) 10 MG tablet Take 10 mg by mouth at bedtime. 01/23/20   [provider]  predniSONE  (STERAPRED UNI-PAK 48 TAB) 10 MG (48) TBPK tablet Take by mouth daily. 10 mg ds 12 days/ TAKE AS DIRECTED ON PACK 04/24/24   Margrette Taft BRAVO, MD  sodium bicarbonate  650 MG tablet Take 650 mg by mouth daily.    [provider]  tamsulosin  (FLOMAX ) 0.4 MG CAPS capsule Take 0.4 mg by mouth daily.    [provider]  valsartan  (DIOVAN ) 320 MG tablet Take 320 mg by mouth daily.    [provider]    Physical Exam: Vitals:   12/09/24 2200 12/09/24 2201  BP: (!) 180/104   Pulse: 97   Resp: 18   Temp: (!) 97.5 F (36.4 C)   TempSrc: Oral   SpO2: 93%   Weight:  94.3 kg  Height:  6' 1.5 (1.867 m)   General: Elderly male. Awake and alert and oriented x3. Not in any acute distress.  HEENT: NCAT.  PERRLA. EOMI. Sclerae anicteric.  Moist  mucosal membranes. Neck: Neck painful with movement. No carotid bruits. No masses palpated.  Cardiovascular: Regular rate with normal S1-S2 sounds. No murmurs, rubs or gallops auscultated. No JVD.  Respiratory: Clear breath sounds.  No accessory muscle use. Abdomen: Soft, nontender, nondistended. Active bowel sounds. No masses or hepatosplenomegaly  Skin: No rashes, lesions, or ulcerations.  Dry, warm to touch. Musculoskeletal: Tender to palpation on palpation of thoracic back.  2+ dorsalis pedis and radial pulses. Good ROM.  No contractures  Psychiatric: Intact judgment and insight.  Mood appropriate to current condition. Neurologic: No focal neurological deficits. Strength is 5/5 x 4.  CN II - XII grossly intact.   Assessment and Plan: Acute T9 vertebral fracture Fall at home CT thoracic spine showed T9 fracture Continue IV Dilaudid  0.5 mg every 3 hours as needed for moderate/severe pain Continue fall precaution  Acute kidney injury superimposed on CKD 3B BUN/creatinine 20/2.04 (baseline creatinine at 1.6 - 1.7). Continue gentle hydration Renally adjust medications, avoid nephrotoxic agents/dehydration/hypotension  Hypertensive urgency Essential hypertension Patient's thoracic back pain is possibly a contributing factor to the elevated BP Continue IV hydralazine  10 mg every 6 hours as needed for SBP > 170 Continue Coreg , Avapro  per home regimen  Mixed hyperlipidemia Continue Pravachol   Obstructive sleep apnea Continue CPAP (CPAP settings 4.5 per medical record)  History of bladder tumor Patient is status post cystoscopy with transurethral resection by Dr. Carolee January 2024.   Continue Flomax    Advance Care Planning: Full code  Consults: Neurosurgery (Dr. Gillie) by [facility] EDP  Family Communication: Wife at bedside (all questions answered to satisfaction)  Severity of Illness: The appropriate patient status for this patient is INPATIENT. Inpatient status is judged to be  reasonable and necessary in order to provide the required intensity of service to ensure the patient's safety. The patient's presenting symptoms, physical exam findings, and initial radiographic and laboratory data in the context of their chronic comorbidities is felt to place them at high risk for further clinical deterioration.  Furthermore, it is not anticipated that the patient will be medically stable for discharge from the hospital within 2 midnights of admission.   * I certify that at the point of admission it is my clinical judgment that the patient will require inpatient hospital care spanning beyond 2 midnights from the point of admission due to high intensity of service, high risk for further deterioration and high frequency of surveillance required.*  Author: Glinda Natzke, DO 12/10/2024 12:14 AM  For on call review [email or web address].      [1]  Allergies Allergen Reactions   Poison Oak Extract Hives and Itching   Sulfonamide Derivatives Itching and Rash   Sulfa Antibiotics

## 2024-12-10 NOTE — ED Notes (Signed)
 Dr aware pt verbalized pain meds were not effective enough for pain. New orders received.

## 2024-12-10 NOTE — ED Notes (Signed)
 ED TO INPATIENT HANDOFF REPORT  ED Nurse Name and Phone #: Rock  [phone]  S Name/Age/Gender Zachary [name] 78 y.o. male Room/Bed: APA01/APA01  Code Status   Code Status: Full Code  Home/SNF/Other Home Patient oriented to: self, place, time, and situation Is this baseline? Yes   Triage Complete: Triage complete  Chief Complaint T9 vertebral fracture (HCC) [S22.079A]  Triage Note Pt bib EMS from home after tripping walking across gravel and fell backwards. Pt reports pain in lower neck and in between shoulders, as well as lower back. Pt severe on palpation. 170/90 BP for EMS. Other VSS. No palpable deformities. Pt states he hit his head, (-) Loc, (-) thinners.    Allergies Allergies[1]  Level of Care/Admitting Diagnosis ED Disposition     ED Disposition  Admit   Condition  --   Comment  Hospital Area: [facility] [[id number]]  Level of Care: Med-Surg [16]  May admit patient to [facility] or [facility] if equivalent level of care is available:: Yes  Diagnosis: T9 vertebral fracture [facility]) [[id number]]  Admitting Physician: ADEFESO, OLADAPO [[id number]]  Attending Physician: ADEFESO, OLADAPO [[id number]]  Certification:: I certify this patient will need inpatient services for at least 2 midnights  Expected Medical Readiness: 12/13/2024          B Medical/Surgery History Past Medical History:  Diagnosis Date   Anxiety    takes Valium  as needed   Arthritis    Bladder cancer (HCC)    takes Rapaflo daily   Blood dyscrasia    07/08/16: pt told he was a free bleeder after bleeding a lot when derm cut off mole on forehead. No excessive bleeding with minor wounds at home, no bleeding problems perioperatively with prior surgeries.   Chronic back pain    spondylolisthesis   Cluster headaches    Depression    takes Lexapro  daily   Essential hypertension, benign    takes Diovan -HCT daily   Hearing loss    hearing aids   History of kidney stones    Hx  of complications due to general anesthesia    Aborted surgery 07/15/2016 due to hypotension per pt   Joint pain    Kidney stone 03/2019   passed stone   Obstructive sleep apnea on CPAP    uses CPAP @ night   Parkinsonism (HCC)    Pneumonia    several times--last time about 3-4 yrs ago   Pre-diabetes    Prediabetes    Syncopal episodes    Thyroid  condition    Past Surgical History:  Procedure Laterality Date   ANTERIOR CERVICAL DECOMP/DISCECTOMY FUSION N/A 06/05/2013   Procedure:  C3-4 Anterior Cervical Discectomy and Fusion, Allograft, Plate;  Surgeon: Oneil JAYSON Herald, MD;  Location: [facility] OR;  Service: Orthopedics;  Laterality: N/A;  C3-4 Anterior Cervical Discectomy and Fusion, Allograft, Plate   Arthroscopic knee surgery     BACK SURGERY      x 2   BLADDER SURGERY     x 5 to remove tumor   CARPAL TUNNEL RELEASE     CATARACT EXTRACTION W/PHACO  12/19/2012   Procedure: CATARACT EXTRACTION PHACO AND INTRAOCULAR LENS PLACEMENT (IOC);  Surgeon: Cherene Mania, MD;  Location: [facility] ORS;  Service: Ophthalmology;  Laterality: Left;  CDE: 26.80   CATARACT EXTRACTION W/PHACO  01/09/2013   Procedure: CATARACT EXTRACTION PHACO AND INTRAOCULAR LENS PLACEMENT (IOC);  Surgeon: Cherene Mania, MD;  Location: [facility];  Service: Ophthalmology;  Laterality: Right;  CDE:22.83  CERVICAL FUSION  06/05/2013   C 3  C4   CYSTOSCOPY     CYSTOSCOPY W/ URETERAL STENT PLACEMENT Bilateral 02/12/2020   Procedure: CYSTOSCOPY WITH RETROGRADE PYELOGRAM/URETERAL STENT PLACEMENT;  Surgeon: Watt Rush, MD;  Location: [facility];  Service: Urology;  Laterality: Bilateral;   INTRAMEDULLARY (IM) NAIL INTERTROCHANTERIC Left 02/10/2023   Procedure: LEFT INTRAMEDULLARY (IM) NAILING OF LEFT FEMUR;  Surgeon: Kendal Franky SQUIBB, MD;  Location: [facility] OR;  Service: Orthopedics;  Laterality: Left;   LITHOTRIPSY  02/2020   REFRACTIVE SURGERY     Hx: of   RETINAL DETACHMENT SURGERY     TOTAL KNEE ARTHROPLASTY Left 02/01/2018   Procedure: TOTAL KNEE  ARTHROPLASTY;  Surgeon: Margrette Taft BRAVO, MD;  Location: [facility];  Service: Orthopedics;  Laterality: Left;   TRANSURETHRAL RESECTION OF BLADDER TUMOR Right 07/27/2015   Procedure: TRANSURETHRAL RESECTION OF BLADDER TUMOR (TURBT);  Surgeon: Arlena Gal, MD;  Location: [facility];  Service: Urology;  Laterality: Right;   TRANSURETHRAL RESECTION OF BLADDER TUMOR WITH MITOMYCIN -C N/A 01/25/2023   Procedure: TRANSURETHRAL RESECTION OF BLADDER TUMOR WITH GEMCITABINE ;  Surgeon: Carolee Sherwood JONETTA DOUGLAS, MD;  Location: [facility];  Service: Urology;  Laterality: N/A;   wisdom tooth extraction       A IV Location/Drains/Wounds Patient Lines/Drains/Airways Status     Active Line/Drains/Airways     Name Placement date Placement time Site Days   Peripheral IV 12/09/24 20 G 1 Anterior;Distal;Right;Upper Arm 12/09/24  2315  Arm  1   Urethral Catheter Toni RN Coude 16 Fr. 12/10/24  0236  Coude  less than 1   Ureteral Drain/Stent Left ureter 6 Fr. 02/12/20  1858  Left ureter  1763   Ureteral Drain/Stent Right ureter 6 Fr. 02/12/20  1903  Right ureter  1763            Intake/Output Last 24 hours  Intake/Output Summary (Last 24 hours) at 12/10/2024 1644 Last data filed at 12/10/2024 1629 Gross per 24 hour  Intake --  Output 2750 ml  Net -2750 ml    Labs/Imaging Results for orders placed or performed during the hospital encounter of 12/09/24 (from the past 48 hours)  CBC     Status: Abnormal   Collection Time: 12/09/24 11:23 PM  Result Value Ref Range   WBC 12.4 (H) 4.0 - 10.5 K/uL   RBC 4.52 4.22 - 5.81 MIL/uL   Hemoglobin 13.5 13.0 - 17.0 g/dL   HCT 58.4 60.9 - 47.9 %   MCV 91.8 80.0 - 100.0 fL   MCH 29.9 26.0 - 34.0 pg   MCHC 32.5 30.0 - 36.0 g/dL   RDW 86.1 88.4 - 84.4 %   Platelets 253 150 - 400 K/uL   nRBC 0.0 0.0 - 0.2 %    Comment: Performed at [facility], [address], [place], [place] [address]  Comprehensive metabolic panel     Status: Abnormal   Collection Time: 12/09/24  11:23 PM  Result Value Ref Range   Sodium 142 135 - 145 mmol/L   Potassium 3.6 3.5 - 5.1 mmol/L   Chloride 105 98 - 111 mmol/L   CO2 30 22 - 32 mmol/L   Glucose, Bld 122 (H) 70 - 99 mg/dL    Comment: Glucose reference range applies only to samples taken after fasting for at least 8 hours.   BUN 28 (H) 8 - 23 mg/dL   Creatinine, Ser 7.95 (H) 0.61 - 1.24 mg/dL   Calcium 9.1 8.9 - 89.6 mg/dL  Total Protein 7.0 6.5 - 8.1 g/dL   Albumin  4.0 3.5 - 5.0 g/dL   AST 26 15 - 41 U/L    Comment: HEMOLYSIS AT THIS LEVEL MAY AFFECT RESULT   ALT 13 0 - 44 U/L   Alkaline Phosphatase 97 38 - 126 U/L   Total Bilirubin 0.3 0.0 - 1.2 mg/dL   GFR, Estimated 33 (L) >60 mL/min    Comment: (NOTE) Calculated using the CKD-EPI Creatinine Equation (2021)    Anion gap 8 5 - 15    Comment: Performed at [facility], [address] [place], [place] [address]  Comprehensive metabolic panel     Status: Abnormal   Collection Time: 12/10/24  6:01 AM  Result Value Ref Range   Sodium 144 135 - 145 mmol/L   Potassium 3.3 (L) 3.5 - 5.1 mmol/L   Chloride 105 98 - 111 mmol/L   CO2 33 (H) 22 - 32 mmol/L   Glucose, Bld 126 (H) 70 - 99 mg/dL    Comment: Glucose reference range applies only to samples taken after fasting for at least 8 hours.   BUN 26 (H) 8 - 23 mg/dL   Creatinine, Ser 8.12 (H) 0.61 - 1.24 mg/dL   Calcium 8.7 (L) 8.9 - 10.3 mg/dL   Total Protein 6.6 6.5 - 8.1 g/dL   Albumin  3.8 3.5 - 5.0 g/dL   AST 21 15 - 41 U/L   ALT 13 0 - 44 U/L   Alkaline Phosphatase 102 38 - 126 U/L   Total Bilirubin 0.3 0.0 - 1.2 mg/dL   GFR, Estimated 36 (L) >60 mL/min    Comment: (NOTE) Calculated using the CKD-EPI Creatinine Equation (2021)    Anion gap 6 5 - 15    Comment: Performed at [facility], [address], [place], [place] [address]  CBC     Status: Abnormal   Collection Time: 12/10/24  6:01 AM  Result Value Ref Range   WBC 11.7 (H) 4.0 - 10.5 K/uL   RBC 4.38 4.22 - 5.81 MIL/uL   Hemoglobin 12.8 (L) 13.0  - 17.0 g/dL   HCT 58.7 60.9 - 47.9 %   MCV 94.1 80.0 - 100.0 fL   MCH 29.2 26.0 - 34.0 pg   MCHC 31.1 30.0 - 36.0 g/dL   RDW 86.2 88.4 - 84.4 %   Platelets 228 150 - 400 K/uL   nRBC 0.0 0.0 - 0.2 %    Comment: Performed at [facility], [address], [place], [place] [address]  Magnesium      Status: None   Collection Time: 12/10/24  6:01 AM  Result Value Ref Range   Magnesium  1.8 1.7 - 2.4 mg/dL    Comment: Performed at [facility], [address] [place], [place] [address]  Phosphorus     Status: None   Collection Time: 12/10/24  6:01 AM  Result Value Ref Range   Phosphorus 4.2 2.5 - 4.6 mg/dL    Comment: Performed at [facility], [address], [place], [place] [address]   CT Thoracic Spine Wo Contrast Result Date: 12/09/2024 EXAM: CT THORACIC SPINE WITHOUT CONTRAST 12/09/2024 10:59:46 PM TECHNIQUE: CT of the thoracic spine was performed without the administration of intravenous contrast. Multiplanar reformatted images are provided for review. Automated exposure control, iterative reconstruction, and/or weight based adjustment of the mA/kV was utilized to reduce the radiation dose to as low as reasonably achievable. COMPARISON: Thoracic spine MRI 01/20/2023 CLINICAL HISTORY: Back trauma, no prior imaging (Age >= 16y)  FINDINGS: BONES AND ALIGNMENT: Normal vertebral body heights except at T9. At T9, there is an acute fracture that traverses the anterior and posterior walls and extends into both pedicles. There is hyperextension at this level. The fracture pattern is consistent with an AO Spine B3 hyperextension type injury. There is almost certainly a tear of the anterior longitudinal ligament, which could be confirmed with MRI. Normal alignment otherwise. DEGENERATIVE CHANGES: There are flowing anterior osteophytes throughout the thoracic spine. SOFT TISSUES: No acute abnormality. IMPRESSION: 1. Acute T9 fracture with hyperextension, consistent with an AOSpine B3 injury, with probable  anterior longitudinal ligament tear; recommend MRI to confirm the ligament injury and assess the spinal cord. 2. Discussed with Dr. Cleotilde at 11:34 pm on 12/09/2024 . Electronically signed by: Franky Stanford MD 12/09/2024 11:34 PM EST RP Workstation: [id number]   CT Lumbar Spine Wo Contrast Result Date: 12/09/2024 EXAM: CT OF THE LUMBAR SPINE WITHOUT CONTRAST 12/09/2024 10:59:46 PM TECHNIQUE: CT of the lumbar spine was performed without the administration of intravenous contrast. Multiplanar reformatted images are provided for review. Automated exposure control, iterative reconstruction, and/or weight based adjustment of the mA/kV was utilized to reduce the radiation dose to as low as reasonably achievable. COMPARISON: None available. CLINICAL HISTORY: Back trauma, no prior imaging (Age >= 16y). FINDINGS: BONES AND ALIGNMENT: Normal vertebral body heights. No acute fracture or suspicious bone lesion. Normal alignment. DEGENERATIVE CHANGES: L3-L5 PLIF. Bulky ossific material of the dorsal spinal canal at the L2-L3 level causing severe stenosis. There is mild spinal canal stenosis at the L3-L4 level. Otherwise, the spinal canal is widely patent. Moderate bilateral L2 neural foraminal stenosis. Mild lucency along the right L3 transpedicular screw, which may indicate loosening. SOFT TISSUES: Calcific aortic atherosclerosis. Nonobstructing interpolar calculus of the right kidney measuring 4 mm. No acute abnormality. IMPRESSION: 1. 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 2. Mild lucency along the right L3 transpedicular screw, which may indicate loosening. Electronically signed by: Franky Stanford MD 12/09/2024 11:24 PM EST RP Workstation: [id number]   CT Cervical Spine Wo Contrast Result Date: 12/09/2024 EXAM: CT CERVICAL SPINE WITHOUT CONTRAST 12/09/2024 10:59:46 PM TECHNIQUE: CT of the cervical spine was performed without the  administration of intravenous contrast. Multiplanar reformatted images are provided for review. Automated exposure control, iterative reconstruction, and/or weight based adjustment of the mA/kV was utilized to reduce the radiation dose to as low as reasonably achievable. COMPARISON: None available. CLINICAL HISTORY: Neck trauma (Age >= 65y) FINDINGS: BONES AND ALIGNMENT: C3-C4 ACDF. No acute fracture or traumatic malalignment. DEGENERATIVE CHANGES: Multilevel degenerative disc disease with no high-grade spinal canal stenosis. SOFT TISSUES: No prevertebral soft tissue swelling. IMPRESSION: 1. No acute findings. 2. C3-C4 ACDF. 3. Multilevel degenerative disc disease without high-grade spinal canal stenosis. Electronically signed by: Franky Stanford MD 12/09/2024 11:15 PM EST RP Workstation: [id number]   CT Head Wo Contrast Result Date: 12/09/2024 EXAM: CT HEAD WITHOUT 12/09/2024 10:59:46 PM TECHNIQUE: CT of the head was performed without the administration of intravenous contrast. Automated exposure control, iterative reconstruction, and/or weight based adjustment of the mA/kV was utilized to reduce the radiation dose to as low as reasonably achievable. COMPARISON: 02/23/2023 CLINICAL HISTORY: Head trauma, minor (Age >= 65y); Trauma after fall FINDINGS: BRAIN AND VENTRICLES: No acute intracranial hemorrhage. No mass effect or midline shift. No extra-axial fluid collection. 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 No hydrocephalus. Mild cerebral atrophy. Patchy white matter hypodensities compatible with  moderate chronic small vessel ischemic disease. Calcified atherosclerosis at skull base. ORBITS: Bilateral cataract extraction. SINUSES AND MASTOIDS: No acute abnormality. SOFT TISSUES AND SKULL: No acute skull fracture. No acute soft tissue abnormality. IMPRESSION: 1. No acute intracranial abnormality. 2. Mild cerebral atrophy, moderate chronic small vessel ischemic disease, and a small chronic lacunar infarct  in the right thalamus, unchanged from prior. Electronically signed by: Franky Stanford MD 12/09/2024 11:03 PM EST RP Workstation: [id number]    Pending Labs Unresulted Labs (From admission, onward)    None       Vitals/Pain Today's Vitals   12/10/24 1531 12/10/24 1545 12/10/24 1615 12/10/24 1622  BP:  (!) 164/90 (!) 168/89   Pulse:  99 100   Resp:  20 (!) 22   Temp:    97.9 F (36.6 C)  TempSrc:    Oral  SpO2:  96% 92%   Weight:      Height:      PainSc: 10-Worst pain ever   5     Isolation Precautions No active isolations  Medications Medications  lactated ringers  infusion (0 mLs Intravenous Stopped 12/10/24 1305)  pravastatin  (PRAVACHOL ) tablet 10 mg (has no administration in time range)  carvedilol  (COREG ) tablet 3.125 mg (3.125 mg Oral Given 12/10/24 0853)  tamsulosin  (FLOMAX ) capsule 0.4 mg (0.4 mg Oral Given 12/10/24 0853)  acetaminophen  (TYLENOL ) tablet 650 mg (650 mg Oral Given 12/10/24 0341)    Or  acetaminophen  (TYLENOL ) suppository 650 mg ( Rectal See Alternative 12/10/24 0341)  ondansetron  (ZOFRAN ) tablet 4 mg (has no administration in time range)    Or  ondansetron  (ZOFRAN ) injection 4 mg (has no administration in time range)  hydrALAZINE  (APRESOLINE ) injection 10 mg (10 mg Intravenous Given 12/10/24 0436)  irbesartan  (AVAPRO ) tablet 150 mg (150 mg Oral Given 12/10/24 0852)  DULoxetine  (CYMBALTA ) DR capsule 60 mg (60 mg Oral Given 12/10/24 0852)  HYDROmorphone  (DILAUDID ) injection 1 mg (has no administration in time range)  methocarbamol  (ROBAXIN ) tablet 500 mg (500 mg Oral Given 12/10/24 1627)  HYDROmorphone  (DILAUDID ) injection 1 mg (1 mg Intravenous Given 12/09/24 2319)  ondansetron  (ZOFRAN ) injection 4 mg (4 mg Intravenous Given 12/09/24 2316)  HYDROmorphone  (DILAUDID ) injection 1 mg (1 mg Intravenous Given 12/10/24 0002)  potassium chloride  10 mEq in 100 mL IVPB (0 mEq Intravenous Stopped 12/10/24 1529)    Mobility non-ambulatory     Focused  Assessments Spine roll precautions   R Recommendations: See Admitting Provider Note  Report given to:   Additional Notes: Whole pills, wear glasses, refused shirt off, RA baseline,       [1]  Allergies Allergen Reactions   Poison Oak Extract Hives and Itching   Sulfa Antibiotics Other (See Comments)    Unknown    Sulfonamide Derivatives Itching and Rash

## 2024-12-11 ENCOUNTER — Inpatient Hospital Stay (HOSPITAL_COMMUNITY)

## 2024-12-11 MED ORDER — CHLORHEXIDINE GLUCONATE CLOTH 2 % EX PADS
6.0000 | MEDICATED_PAD | Freq: Every day | CUTANEOUS | Status: DC
  Administered 2024-12-11 – 2024-12-19 (×9): 6 via TOPICAL

## 2024-12-11 MED ORDER — LACTATED RINGERS IV SOLN: INTRAVENOUS | Status: AC

## 2024-12-11 NOTE — Progress Notes (Signed)
 Progress Note   Patient: Zachary [name] FMW:[id number] DOB: 04/06/1946 DOA: 12/09/2024     1 DOS: the patient was seen and examined on 12/11/2024   Brief hospital course: 78 year old history of hypertension, CKD stage IIIb, bladder cancer status post multiple bladder surgeries and TURBT 12/2022, low-grade papillary urothelial carcinoma, anxiety/depression, prediabetes, urinary retention requiring chronic intermittent catheterizations presenting with mechanical fall. The patient states that he was exiting his vehicle and walking up to his residence when he slipped and fell onto his back and hit his head. Patient denies any neurological deficits such as numbness or tingling in his extremities   In the ED, the patient was afebrile and hemodynamically stable with oxygen  saturation 97% on 2 L.  WBC 12.4, hemoglobin 13.5, platelet 253.  Sodium 142, potassium 3.8, bicarbonate 30, serum creatinine 2.04.  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  EDP spoke with neurosurgery, Dr. Gillie.  He recommended  Strict spinal precautions with transfer for MRI imaging and further evaluation by neurosurgery team.   Notably, the patient has CT of the brain which was negative for acute findings.  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  CT cervical spine was negative for acute findings.  Assessment and Plan: Acute T9 vertebral fracture -12/09/2024 CT T-spine as discussed above -Neurosurgery consulted. Recs noted for stabilization with surgery, tentative plan for Wed -Per ACS-NSQUIP risk stratification, pt noted to have 19.6% risk of overall serious complications, 1.7% risk perioperative cardiac complication, 16.7% risk total complication. Pt currently without chest pain or sob. Last 2d echo from 02/20/21 with normal LVEF, valves appear unremarkable, 02/2021 stress  test normal. At this point, benefits to surgery would likely outweigh perioperative risk   CKD stage IIIb - Baseline creatinine 1.8-2.0 - Patient follows with [facility] nephrology -Cr stable this AM   Essential hypertension - Continue ARB home dose - Continue carvedilol  3.125 mg twice daily   Mixed hyperlipidemia - Continue statin   OSA -Continue CPAP (CPAP settings 4.5 per medical record)    Bladder cancer -s/p TURBT 12/2022 -Continue tamsulosin  - Follow-up [facility] urology   Urinary retention - Patient normally performs self catheterizations at home - Foley catheter placed in the ED   Subjective: complaining of L arm and elbow pain. Continued back pain. Very eager to have surgery performed  Physical Exam: Vitals:   12/11/24 0402 12/11/24 0720 12/11/24 1030 12/11/24 1526  BP: (!) 158/97 (!) 161/90  (!) 133/90  Pulse: (!) 104 (!) 101  (!) 102  Resp: 19 17  17   Temp: 97.9 F (36.6 C) 97.9 F (36.6 C)  97.7 F (36.5 C)  TempSrc: Oral Oral  Oral  SpO2: 95% 95% 96% 90%  Weight:      Height:       General exam: Awake, laying in bed, appears uncomfortable Respiratory system: Normal respiratory effort, no wheezing Cardiovascular system: regular rate, s1, s2 Gastrointestinal system: Soft, nondistended, positive BS Central nervous system: CN2-12 grossly intact, strength intact Extremities: Perfused, no clubbing Skin: Normal skin turgor, no notable skin lesions seen Psychiatry: Mood normal // no visual hallucinations   Data Reviewed:  Labs reviewed: Na 144, K 3.3, Cr 1.87, WBC 11.7, Hgb 12.8, Plts 228  Family Communication: Pt in room, family at bedside  Disposition: Status is: Inpatient Remains inpatient appropriate because: Severity of illness  Planned Discharge Destination: Pending post-op PT/OT eval  Author: Garnette Pelt, MD 12/11/2024 5:03 PM  For on call review [email or web address].

## 2024-12-11 NOTE — Consult Note (Addendum)
 Reason for Consult:T9 fracture Referring Physician: EDP  Zachary [name] is an 78 y.o. male.   HPI:  78 year old male presented to the hospital after sustaining a fall on Saturday. He was transferred to [facility] on Sunday evening. He reports back pain and left arm pain. He denies any NT or weakness in his legs. He has been lying flat. Has an extensive medical history. Denies any blood thinners.   Past Medical History:  Diagnosis Date   Anxiety    takes Valium  as needed   Arthritis    Bladder cancer (HCC)    takes Rapaflo daily   Blood dyscrasia    07/08/16: pt told he was a free bleeder after bleeding a lot when derm cut off mole on forehead. No excessive bleeding with minor wounds at home, no bleeding problems perioperatively with prior surgeries.   Chronic back pain    spondylolisthesis   Cluster headaches    Depression    takes Lexapro  daily   Essential hypertension, benign    takes Diovan -HCT daily   Hearing loss    hearing aids   History of kidney stones    Hx of complications due to general anesthesia    Aborted surgery 07/15/2016 due to hypotension per pt   Joint pain    Kidney stone 03/2019   passed stone   Obstructive sleep apnea on CPAP    uses CPAP @ night   Parkinsonism (HCC)    Pneumonia    several times--last time about 3-4 yrs ago   Pre-diabetes    Prediabetes    Syncopal episodes    Thyroid  condition     Past Surgical History:  Procedure Laterality Date   ANTERIOR CERVICAL DECOMP/DISCECTOMY FUSION N/A 06/05/2013   Procedure:  C3-4 Anterior Cervical Discectomy and Fusion, Allograft, Plate;  Surgeon: Oneil JAYSON Herald, MD;  Location: [facility] OR;  Service: Orthopedics;  Laterality: N/A;  C3-4 Anterior Cervical Discectomy and Fusion, Allograft, Plate   Arthroscopic knee surgery     BACK SURGERY      x 2   BLADDER SURGERY     x 5 to remove tumor   CARPAL TUNNEL RELEASE     CATARACT EXTRACTION W/PHACO  12/19/2012   Procedure: CATARACT EXTRACTION PHACO AND  INTRAOCULAR LENS PLACEMENT (IOC);  Surgeon: Cherene Mania, MD;  Location: [facility]S;  Service: Ophthalmology;  Laterality: Left;  CDE: 26.80   CATARACT EXTRACTION W/PHACO  01/09/2013   Procedure: CATARACT EXTRACTION PHACO AND INTRAOCULAR LENS PLACEMENT (IOC);  Surgeon: Cherene Mania, MD;  Location: [facility];  Service: Ophthalmology;  Laterality: Right;  CDE:22.83   CERVICAL FUSION  06/05/2013   C 3  C4   CYSTOSCOPY     CYSTOSCOPY W/ URETERAL STENT PLACEMENT Bilateral 02/12/2020   Procedure: CYSTOSCOPY WITH RETROGRADE PYELOGRAM/URETERAL STENT PLACEMENT;  Surgeon: Watt Rush, MD;  Location: [facility];  Service: Urology;  Laterality: Bilateral;   INTRAMEDULLARY (IM) NAIL INTERTROCHANTERIC Left 02/10/2023   Procedure: LEFT INTRAMEDULLARY (IM) NAILING OF LEFT FEMUR;  Surgeon: Kendal Franky SQUIBB, MD;  Location: [facility] OR;  Service: Orthopedics;  Laterality: Left;   LITHOTRIPSY  02/2020   REFRACTIVE SURGERY     Hx: of   RETINAL DETACHMENT SURGERY     TOTAL KNEE ARTHROPLASTY Left 02/01/2018   Procedure: TOTAL KNEE ARTHROPLASTY;  Surgeon: Margrette Taft BRAVO, MD;  Location: [facility];  Service: Orthopedics;  Laterality: Left;   TRANSURETHRAL RESECTION OF BLADDER TUMOR Right 07/27/2015   Procedure: TRANSURETHRAL RESECTION OF BLADDER TUMOR (TURBT);  Surgeon: Arlena Gal, MD;  Location: [facility];  Service: Urology;  Laterality: Right;   TRANSURETHRAL RESECTION OF BLADDER TUMOR WITH MITOMYCIN -C N/A 01/25/2023   Procedure: TRANSURETHRAL RESECTION OF BLADDER TUMOR WITH GEMCITABINE ;  Surgeon: Carolee Sherwood JONETTA DOUGLAS, MD;  Location: [facility];  Service: Urology;  Laterality: N/A;   wisdom tooth extraction      Allergies[1]  Social History   Tobacco Use   Smoking status: Former    Current packs/day: 0.00    Average packs/day: 0.3 packs/day for 1 year (0.3 ttl pk-yrs)    Types: Cigarettes, Cigars    Start date: 12/31/1975    Quit date: 12/30/1976    Years since quitting: 47.9    Passive exposure: Past   Smokeless tobacco: Never   Tobacco  comments:    1985  Substance Use Topics   Alcohol  use: No    Comment: quit 1990    Family History  Problem Relation Age of Onset   Hypertension Father    Cancer Father        Prostate   Depression Mother    Hypertension Mother    Alcohol  abuse Mother    COPD Mother        passed 07-08-17   Hypertension Sister    Anxiety disorder Sister    Alcohol  abuse Maternal Grandfather    ADD / ADHD Neg Hx    Bipolar disorder Neg Hx    Dementia Neg Hx    Drug abuse Neg Hx    OCD Neg Hx    Paranoid behavior Neg Hx    Schizophrenia Neg Hx    Seizures Neg Hx    Sexual abuse Neg Hx    Physical abuse Neg Hx      Review of Systems  Positive ROS: as above  All other systems have been reviewed and were otherwise negative with the exception of those mentioned in the HPI and as above.  Objective: Vital signs in last 24 hours: Temp:  [97.5 F (36.4 C)-98.8 F (37.1 C)] 97.7 F (36.5 C) (12/15 1526) Pulse Rate:  [99-104] 102 (12/15 1526) Resp:  [17-24] 17 (12/15 1526) BP: (133-170)/(80-97) 133/90 (12/15 1526) SpO2:  [90 %-96 %] 90 % (12/15 1526)  General Appearance: Alert, cooperative, no distress, appears stated age Head: Normocephalic, without obvious abnormality, atraumatic Eyes: PERRL, conjunctiva/corneas clear, EOM's intact, fundi benign, both eyes      Lungs:  respirations unlabored Heart: Regular rate and rhythm Abdomen: Soft, non-tender, bowel sounds active all four quadrants, no masses, no organomegaly Extremities: Extremities normal, atraumatic, no cyanosis or edema Pulses: 2+ and symmetric all extremities Skin: Skin color, texture, turgor normal, no rashes or lesions  NEUROLOGIC:   Mental status: A&O x4, no aphasia, good attention span, Memory and fund of knowledge Motor Exam - grossly normal, normal tone and bulk Sensory Exam - grossly normal Reflexes: symmetric, no pathologic reflexes, No Hoffman's, No clonus Coordination - grossly normal Gait - not tested Balance  - not tested Cranial Nerves: I: smell Not tested  II: visual acuity  OS: na    OD: na  II: visual fields Full to confrontation  II: pupils Equal, round, reactive to light  III,VII: ptosis None  III,IV,VI: extraocular muscles  Full ROM  V: mastication   V: facial light touch sensation    V,VII: corneal reflex    VII: facial muscle function - upper    VII: facial muscle function - lower   VIII: hearing   IX: soft palate elevation  IX,X: gag reflex   XI: trapezius strength    XI: sternocleidomastoid strength   XI: neck flexion strength    XII: tongue strength      Data Review Lab Results  Component Value Date   WBC 11.7 (H) 12/10/2024   HGB 12.8 (L) 12/10/2024   HCT 41.2 12/10/2024   MCV 94.1 12/10/2024   PLT 228 12/10/2024   Lab Results  Component Value Date   NA 144 12/10/2024   K 3.3 (L) 12/10/2024   CL 105 12/10/2024   CO2 33 (H) 12/10/2024   BUN 26 (H) 12/10/2024   CREATININE 1.87 (H) 12/10/2024   GLUCOSE 126 (H) 12/10/2024   Lab Results  Component Value Date   INR 1.0 02/09/2023    Radiology: CT Thoracic Spine Wo Contrast Result Date: 12/09/2024 EXAM: CT THORACIC SPINE WITHOUT CONTRAST 12/09/2024 10:59:46 PM TECHNIQUE: CT of the thoracic spine was performed without the administration of intravenous contrast. Multiplanar reformatted images are provided for review. Automated exposure control, iterative reconstruction, and/or weight based adjustment of the mA/kV was utilized to reduce the radiation dose to as low as reasonably achievable. COMPARISON: Thoracic spine MRI 01/20/2023 CLINICAL HISTORY: Back trauma, no prior imaging (Age >= 16y) FINDINGS: BONES AND ALIGNMENT: Normal vertebral body heights except at T9. At T9, there is an acute fracture that traverses the anterior and posterior walls and extends into both pedicles. There is hyperextension at this level. The fracture pattern is consistent with an AO Spine B3 hyperextension type injury. There is almost  certainly a tear of the anterior longitudinal ligament, which could be confirmed with MRI. Normal alignment otherwise. DEGENERATIVE CHANGES: There are flowing anterior osteophytes throughout the thoracic spine. SOFT TISSUES: No acute abnormality. IMPRESSION: 1. Acute T9 fracture with hyperextension, consistent with an AOSpine B3 injury, with probable anterior longitudinal ligament tear; recommend MRI to confirm the ligament injury and assess the spinal cord. 2. Discussed with Dr. Cleotilde at 11:34 pm on 12/09/2024 . Electronically signed by: Franky Stanford MD 12/09/2024 11:34 PM EST RP Workstation: [id number]   CT Lumbar Spine Wo Contrast Result Date: 12/09/2024 EXAM: CT OF THE LUMBAR SPINE WITHOUT CONTRAST 12/09/2024 10:59:46 PM TECHNIQUE: CT of the lumbar spine was performed without the administration of intravenous contrast. Multiplanar reformatted images are provided for review. Automated exposure control, iterative reconstruction, and/or weight based adjustment of the mA/kV was utilized to reduce the radiation dose to as low as reasonably achievable. COMPARISON: None available. CLINICAL HISTORY: Back trauma, no prior imaging (Age >= 16y). FINDINGS: BONES AND ALIGNMENT: Normal vertebral body heights. No acute fracture or suspicious bone lesion. Normal alignment. DEGENERATIVE CHANGES: L3-L5 PLIF. Bulky ossific material of the dorsal spinal canal at the L2-L3 level causing severe stenosis. There is mild spinal canal stenosis at the L3-L4 level. Otherwise, the spinal canal is widely patent. Moderate bilateral L2 neural foraminal stenosis. Mild lucency along the right L3 transpedicular screw, which may indicate loosening. SOFT TISSUES: Calcific aortic atherosclerosis. Nonobstructing interpolar calculus of the right kidney measuring 4 mm. No acute abnormality. IMPRESSION: 1. 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 2. Mild  lucency along the right L3 transpedicular screw, which may indicate loosening. Electronically signed by: Franky Stanford MD 12/09/2024 11:24 PM EST RP Workstation: [id number]   CT Cervical Spine Wo Contrast Result Date: 12/09/2024 EXAM: CT CERVICAL SPINE WITHOUT CONTRAST 12/09/2024 10:59:46 PM TECHNIQUE: CT of the cervical spine was performed without the administration of  intravenous contrast. Multiplanar reformatted images are provided for review. Automated exposure control, iterative reconstruction, and/or weight based adjustment of the mA/kV was utilized to reduce the radiation dose to as low as reasonably achievable. COMPARISON: None available. CLINICAL HISTORY: Neck trauma (Age >= 65y) FINDINGS: BONES AND ALIGNMENT: C3-C4 ACDF. No acute fracture or traumatic malalignment. DEGENERATIVE CHANGES: Multilevel degenerative disc disease with no high-grade spinal canal stenosis. SOFT TISSUES: No prevertebral soft tissue swelling. IMPRESSION: 1. No acute findings. 2. C3-C4 ACDF. 3. Multilevel degenerative disc disease without high-grade spinal canal stenosis. Electronically signed by: Franky Stanford MD 12/09/2024 11:15 PM EST RP Workstation: [id number]   CT Head Wo Contrast Result Date: 12/09/2024 EXAM: CT HEAD WITHOUT 12/09/2024 10:59:46 PM TECHNIQUE: CT of the head was performed without the administration of intravenous contrast. Automated exposure control, iterative reconstruction, and/or weight based adjustment of the mA/kV was utilized to reduce the radiation dose to as low as reasonably achievable. COMPARISON: 02/23/2023 CLINICAL HISTORY: Head trauma, minor (Age >= 65y); Trauma after fall FINDINGS: BRAIN AND VENTRICLES: No acute intracranial hemorrhage. No mass effect or midline shift. No extra-axial fluid collection. 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 No hydrocephalus. Mild cerebral atrophy. Patchy white matter hypodensities compatible with moderate chronic small vessel ischemic disease.  Calcified atherosclerosis at skull base. ORBITS: Bilateral cataract extraction. SINUSES AND MASTOIDS: No acute abnormality. SOFT TISSUES AND SKULL: No acute skull fracture. No acute soft tissue abnormality. IMPRESSION: 1. No acute intracranial abnormality. 2. Mild cerebral atrophy, moderate chronic small vessel ischemic disease, and a small chronic lacunar infarct in the right thalamus, unchanged from prior. Electronically signed by: Franky Stanford MD 12/09/2024 11:03 PM EST RP Workstation: [id number]     Assessment/Plan: 78 year old male presented to the ED after a fall on Saturday. 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 We did order an MRI thoracic spine. This will likely need stabilization with surgery. Will discuss with Dr. Onetha and come up with a plan for this. Discussed this with the patient and his wife and they understood.   Suzen Lacks Meyran 12/11/2024 4:09 PM  Addendum: Agree with above patient will likely need surgery more likely will be Wednesday afternoon.  Unless based on the patient's significant medical history we deemed him too high risk of surgery I would welcome the internal medicine admitting service to weigh in on his surgical risk and whether we need to do any kind of preoperative clearance.  Patient can be elevate his head up to 30 to 45 degrees as long as his pain tolerates that.  But he needs to remain in bed until we get either surgery or brace if were treating it nonoperatively based on medical risk factors        [1]  Allergies Allergen Reactions   Poison Oak Extract Hives and Itching   Sulfa Antibiotics Other (See Comments)    Unknown    Sulfonamide Derivatives Itching and Rash

## 2024-12-11 NOTE — TOC CAGE-AID Note (Signed)
 Transition of Care [facility]) - CAGE-AID Screening   Patient Details  Name: Zachary [name] MRN: [id number] Date of Birth: [birth year]  [facility]) CM/SW Contact:    Dazaria Macneill E Destanie Tibbetts, LCSW Phone Number: 12/11/2024, 10:02 AM   Clinical Narrative: No SA noted.   CAGE-AID Screening:    Have You Ever Felt You Ought to Cut Down on Your Drinking or Drug Use?: No Have People Annoyed You By Critizing Your Drinking Or Drug Use?: No Have You Felt Bad Or Guilty About Your Drinking Or Drug Use?: No Have You Ever Had a Drink or Used Drugs First Thing In The Morning to Steady Your Nerves or to Get Rid of a Hangover?: No CAGE-AID Score: 0  Substance Abuse Education Offered: No

## 2024-12-11 NOTE — Plan of Care (Signed)
  Problem: Pain Managment: Goal: General experience of comfort will improve and/or be controlled Outcome: Progressing   Problem: Safety: Goal: Ability to remain free from injury will improve Outcome: Progressing

## 2024-12-12 ENCOUNTER — Other Ambulatory Visit: Payer: Self-pay | Admitting: Neurosurgery

## 2024-12-12 LAB — COMPREHENSIVE METABOLIC PANEL WITH GFR
ALT: 10 U/L (ref 0–44)
AST: 12 U/L — ABNORMAL LOW (ref 15–41)
Albumin: 2.1 g/dL — ABNORMAL LOW (ref 3.5–5.0)
Alkaline Phosphatase: 62 U/L (ref 38–126)
Anion gap: 7 (ref 5–15)
BUN: 20 mg/dL (ref 8–23)
CO2: 28 mmol/L (ref 22–32)
Calcium: 8 mg/dL — ABNORMAL LOW (ref 8.9–10.3)
Chloride: 105 mmol/L (ref 98–111)
Creatinine, Ser: 1.73 mg/dL — ABNORMAL HIGH (ref 0.61–1.24)
GFR, Estimated: 40 mL/min — ABNORMAL LOW (ref >60)
Glucose, Bld: 116 mg/dL — ABNORMAL HIGH (ref 70–99)
Potassium: 3.7 mmol/L (ref 3.5–5.1)
Sodium: 140 mmol/L (ref 135–145)
Total Bilirubin: 0.8 mg/dL (ref 0.0–1.2)
Total Protein: 5.2 g/dL — ABNORMAL LOW (ref 6.5–8.1)

## 2024-12-12 LAB — CBC
HCT: 36.2 % — ABNORMAL LOW (ref 39.0–52.0)
Hemoglobin: 11.6 g/dL — ABNORMAL LOW (ref 13.0–17.0)
MCH: 29.1 pg (ref 26.0–34.0)
MCHC: 32 g/dL (ref 30.0–36.0)
MCV: 91 fL (ref 80.0–100.0)
Platelets: 199 K/uL (ref 150–400)
RBC: 3.98 MIL/uL — ABNORMAL LOW (ref 4.22–5.81)
RDW: 13.7 % (ref 11.5–15.5)
WBC: 9.7 K/uL (ref 4.0–10.5)
nRBC: 0 % (ref 0.0–0.2)

## 2024-12-12 NOTE — Plan of Care (Signed)
°  Problem: Education: Goal: Knowledge of General Education information will improve Description: Including pain rating scale, medication(s)/side effects and non-pharmacologic comfort measures Outcome: Not Progressing    PATIENT IS SCHEDULED FOR SURGERY TOMORROW.

## 2024-12-12 NOTE — Plan of Care (Signed)
  Problem: Pain Managment: Goal: General experience of comfort will improve and/or be controlled Outcome: Progressing   Problem: Safety: Goal: Ability to remain free from injury will improve Outcome: Progressing

## 2024-12-12 NOTE — Progress Notes (Signed)
°  Progress Note   Patient: Zachary [name] FMW:[id number] DOB: 1946/06/27 DOA: 12/09/2024     2 DOS: the patient was seen and examined on 12/12/2024   Brief hospital course: 78 year old history of hypertension, CKD stage IIIb, bladder cancer status post multiple bladder surgeries and TURBT 12/2022, low-grade papillary urothelial carcinoma, anxiety/depression, prediabetes, urinary retention requiring chronic intermittent catheterizations presenting with mechanical fall. The patient states that he was exiting his vehicle and walking up to his residence when he slipped and fell onto his back and hit his head. Patient denies any neurological deficits such as numbness or tingling in his extremities   In the ED, the patient was afebrile and hemodynamically stable with oxygen  saturation 97% on 2 L.  WBC 12.4, hemoglobin 13.5, platelet 253.  Sodium 142, potassium 3.8, bicarbonate 30, serum creatinine 2.04.  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  EDP spoke with neurosurgery, Dr. Gillie.  He recommended  Strict spinal precautions with transfer for MRI imaging and further evaluation by neurosurgery team.   Notably, the patient has CT of the brain which was negative for acute findings.  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  CT cervical spine was negative for acute findings.  Assessment and Plan: Acute T9 vertebral fracture -12/09/2024 CT T-spine as discussed above -Neurosurgery consulted. Recs noted for stabilization with surgery, planned for tomorrow -will follow up on therapy recs post-op   CKD stage IIIb - Baseline creatinine 1.8-2.0 - Patient follows with [facility] nephrology -Cr remains stable this AM   Essential hypertension - Continue ARB home dose - Continue carvedilol  3.125 mg twice daily   Mixed hyperlipidemia - Continue  statin   OSA -Continue CPAP (CPAP settings 4.5 per medical record)    Bladder cancer -s/p TURBT 12/2022 -Continue tamsulosin  - Follow-up [facility] urology   Urinary retention - Patient normally performs self catheterizations at home - Foley catheter placed in the ED   Subjective: Still with continued back pains. Tolerating diet  Physical Exam: Vitals:   12/12/24 0329 12/12/24 0744 12/12/24 0808 12/12/24 1352  BP: (!) 157/86 (!) 174/79  139/81  Pulse: (!) 102 95  (!) 106  Resp: 16  18   Temp: 97.7 F (36.5 C) 98 F (36.7 C)  97.7 F (36.5 C)  TempSrc: Oral Oral  Oral  SpO2: 95% 95%  94%  Weight:      Height:       General exam: Conversant, Laying in bed in no acute distress Respiratory system: normal chest rise, clear, no audible wheezing Cardiovascular system: regular rhythm, s1-s2 Gastrointestinal system: Nondistended, nontender, pos BS Central nervous system: No seizures, no tremors Extremities: No cyanosis, no joint deformities Skin: No rashes, no pallor Psychiatry: Affect normal // mood normal  Data Reviewed:  Labs reviewed: Na 140, K 3.7, Cr 1.73, WBC 9.7, Hgb 11.6, Plts 199  Family Communication: Pt in room, family at bedside  Disposition: Status is: Inpatient Remains inpatient appropriate because: Severity of illness  Planned Discharge Destination: Pending post-op PT/OT eval    Author: Garnette Pelt, MD 12/12/2024 3:14 PM  For on call review [email or web address].

## 2024-12-12 NOTE — Progress Notes (Signed)
 Subjective: Patient reports condition of back pain denies any lower extremity symptoms  Objective: Vital signs in last 24 hours: Temp:  [97.7 F (36.5 C)-98.7 F (37.1 C)] 98 F (36.7 C) (12/16 0744) Pulse Rate:  [95-102] 95 (12/16 0744) Resp:  [16-17] 16 (12/16 0329) BP: (133-174)/(77-90) 174/79 (12/16 0744) SpO2:  [90 %-96 %] 95 % (12/16 0744)  Intake/Output from previous day: 12/15 0701 - 12/16 0700 In: 400.9 [P.O.:240; I.V.:160.9] Out: 1450 [Urine:1450] Intake/Output this shift: No intake/output data recorded.  Strength 5/5  Lab Results: Recent Labs    12/10/24 0601 12/12/24 0458  WBC 11.7* 9.7  HGB 12.8* 11.6*  HCT 41.2 36.2*  PLT 228 199   BMET Recent Labs    12/10/24 0601 12/12/24 0458  NA 144 140  K 3.3* 3.7  CL 105 105  CO2 33* 28  GLUCOSE 126* 116*  BUN 26* 20  CREATININE 1.87* 1.73*  CALCIUM 8.7* 8.0*    Studies/Results:   Assessment/Plan: Hospital day 2 T9 Chance fracture: Stabilization.  Appreciated medicine's risk assessment, will discuss with wife, plan tomorrow afternoon.  LOS: 2 days     Arley SHAUNNA Helling 12/12/2024, 7:55 AM

## 2024-12-13 ENCOUNTER — Inpatient Hospital Stay (HOSPITAL_COMMUNITY): Admitting: Certified Registered Nurse Anesthetist

## 2024-12-13 ENCOUNTER — Encounter (HOSPITAL_COMMUNITY)
Admission: EM | Disposition: A | Discharge status: Skilled Nursing Facility | Payer: Self-pay | Source: Home / Self Care | Attending: Internal Medicine

## 2024-12-13 ENCOUNTER — Encounter (HOSPITAL_COMMUNITY): Payer: Self-pay | Admitting: Internal Medicine

## 2024-12-13 ENCOUNTER — Inpatient Hospital Stay (HOSPITAL_COMMUNITY)

## 2024-12-13 DIAGNOSIS — I10 Essential (primary) hypertension: Secondary | ICD-10-CM

## 2024-12-13 DIAGNOSIS — S22072A Unstable burst fracture of T9-T10 vertebra, initial encounter for closed fracture: Secondary | ICD-10-CM | POA: Diagnosis not present

## 2024-12-13 DIAGNOSIS — Z87891 Personal history of nicotine dependence: Secondary | ICD-10-CM

## 2024-12-13 DIAGNOSIS — F418 Other specified anxiety disorders: Secondary | ICD-10-CM | POA: Diagnosis not present

## 2024-12-13 DIAGNOSIS — S22079A Unspecified fracture of T9-T10 vertebra, initial encounter for closed fracture: Secondary | ICD-10-CM | POA: Diagnosis not present

## 2024-12-13 DIAGNOSIS — W19XXXA Unspecified fall, initial encounter: Secondary | ICD-10-CM | POA: Diagnosis not present

## 2024-12-13 HISTORY — PX: LAMINECTOMY WITH POSTERIOR LATERAL ARTHRODESIS LEVEL 4: SHX6338

## 2024-12-13 LAB — POCT I-STAT 7, (LYTES, BLD GAS, ICA,H+H)
Acid-base deficit: 1 mmol/L (ref 0.0–2.0)
Bicarbonate: 24.6 mmol/L (ref 20.0–28.0)
Calcium, Ion: 1.13 mmol/L — ABNORMAL LOW (ref 1.15–1.40)
HCT: 26 % — ABNORMAL LOW (ref 39.0–52.0)
Hemoglobin: 8.8 g/dL — ABNORMAL LOW (ref 13.0–17.0)
O2 Saturation: 99 %
Potassium: 3.8 mmol/L (ref 3.5–5.1)
Sodium: 139 mmol/L (ref 135–145)
TCO2: 26 mmol/L (ref 22–32)
pCO2 arterial: 42.1 mmHg (ref 32–48)
pH, Arterial: 7.374 (ref 7.35–7.45)
pO2, Arterial: 139 mmHg — ABNORMAL HIGH (ref 83–108)

## 2024-12-13 LAB — CBC
HCT: 34.3 % — ABNORMAL LOW (ref 39.0–52.0)
Hemoglobin: 11.2 g/dL — ABNORMAL LOW (ref 13.0–17.0)
MCH: 29.7 pg (ref 26.0–34.0)
MCHC: 32.7 g/dL (ref 30.0–36.0)
MCV: 91 fL (ref 80.0–100.0)
Platelets: 214 K/uL (ref 150–400)
RBC: 3.77 MIL/uL — ABNORMAL LOW (ref 4.22–5.81)
RDW: 13.6 % (ref 11.5–15.5)
WBC: 10.4 K/uL (ref 4.0–10.5)
nRBC: 0 % (ref 0.0–0.2)

## 2024-12-13 LAB — COMPREHENSIVE METABOLIC PANEL WITH GFR
ALT: 7 U/L (ref 0–44)
AST: 14 U/L — ABNORMAL LOW (ref 15–41)
Albumin: 2.8 g/dL — ABNORMAL LOW (ref 3.5–5.0)
Alkaline Phosphatase: 79 U/L (ref 38–126)
Anion gap: 8 (ref 5–15)
BUN: 24 mg/dL — ABNORMAL HIGH (ref 8–23)
CO2: 26 mmol/L (ref 22–32)
Calcium: 8.5 mg/dL — ABNORMAL LOW (ref 8.9–10.3)
Chloride: 105 mmol/L (ref 98–111)
Creatinine, Ser: 1.75 mg/dL — ABNORMAL HIGH (ref 0.61–1.24)
GFR, Estimated: 39 mL/min — ABNORMAL LOW (ref >60)
Glucose, Bld: 103 mg/dL — ABNORMAL HIGH (ref 70–99)
Potassium: 3.8 mmol/L (ref 3.5–5.1)
Sodium: 139 mmol/L (ref 135–145)
Total Bilirubin: 0.4 mg/dL (ref 0.0–1.2)
Total Protein: 5.6 g/dL — ABNORMAL LOW (ref 6.5–8.1)

## 2024-12-13 LAB — PREPARE RBC (CROSSMATCH)

## 2024-12-13 LAB — SURGICAL PCR SCREEN
MRSA, PCR: NEGATIVE
Staphylococcus aureus: NEGATIVE

## 2024-12-13 SURGERY — LAMINECTOMY WITH POSTERIOR LATERAL ARTHRODESIS LEVEL 4
Anesthesia: General | Site: Back

## 2024-12-13 MED ORDER — CHLORHEXIDINE GLUCONATE CLOTH 2 % EX PADS: 6.0000 | MEDICATED_PAD | Freq: Once | CUTANEOUS | Status: DC

## 2024-12-13 MED ORDER — ACETAMINOPHEN 10 MG/ML IV SOLN
INTRAVENOUS | Status: AC
  Filled 2024-12-13: qty 100

## 2024-12-13 MED ORDER — ACETAMINOPHEN 10 MG/ML IV SOLN
INTRAVENOUS | Status: DC | PRN
  Administered 2024-12-13: 17:00:00 1000 mg via INTRAVENOUS

## 2024-12-13 MED ORDER — PHENYLEPHRINE 80 MCG/ML (10ML) SYRINGE FOR IV PUSH (FOR BLOOD PRESSURE SUPPORT)
PREFILLED_SYRINGE | INTRAVENOUS | Status: DC | PRN
  Administered 2024-12-13: 14:00:00 160 ug via INTRAVENOUS
  Administered 2024-12-13: 16:00:00 80 ug via INTRAVENOUS
  Administered 2024-12-13 (×2): 120 ug via INTRAVENOUS
  Administered 2024-12-13 (×2): 160 ug via INTRAVENOUS
  Administered 2024-12-13: 17:00:00 120 ug via INTRAVENOUS

## 2024-12-13 MED ORDER — PROPOFOL 10 MG/ML IV BOLUS
INTRAVENOUS | Status: DC | PRN
  Administered 2024-12-13: 14:00:00 90 mg via INTRAVENOUS

## 2024-12-13 MED ORDER — SUGAMMADEX SODIUM 200 MG/2ML IV SOLN
INTRAVENOUS | Status: AC
  Filled 2024-12-13: qty 2

## 2024-12-13 MED ORDER — LIDOCAINE 2% (20 MG/ML) 5 ML SYRINGE
INTRAMUSCULAR | Status: DC | PRN
  Administered 2024-12-13: 14:00:00 60 mg via INTRAVENOUS

## 2024-12-13 MED ORDER — OXYCODONE HCL 5 MG/5ML PO SOLN
ORAL | Status: AC
  Filled 2024-12-13: qty 5

## 2024-12-13 MED ORDER — LIDOCAINE 2% (20 MG/ML) 5 ML SYRINGE
INTRAMUSCULAR | Status: AC
  Filled 2024-12-13: qty 5

## 2024-12-13 MED ORDER — CHLORHEXIDINE GLUCONATE 0.12 % MT SOLN: 15.0000 mL | Freq: Once | OROMUCOSAL | Status: AC

## 2024-12-13 MED ORDER — LIDOCAINE-EPINEPHRINE 1 %-1:100000 IJ SOLN
INTRAMUSCULAR | Status: AC
  Filled 2024-12-13: qty 1

## 2024-12-13 MED ORDER — THROMBIN 20000 UNITS EX SOLR
CUTANEOUS | Status: AC
  Filled 2024-12-13: qty 20000

## 2024-12-13 MED ORDER — ACETAMINOPHEN 10 MG/ML IV SOLN
1000.0000 mg | Freq: Once | INTRAVENOUS | Status: DC | PRN
  Administered 2024-12-13: 18:00:00 1000 mg via INTRAVENOUS

## 2024-12-13 MED ORDER — OXYCODONE HCL 5 MG/5ML PO SOLN
5.0000 mg | Freq: Once | ORAL | Status: AC | PRN
  Administered 2024-12-13: 18:00:00 5 mg via ORAL

## 2024-12-13 MED ORDER — HYDROMORPHONE HCL 1 MG/ML IJ SOLN
INTRAMUSCULAR | Status: DC | PRN
  Administered 2024-12-13 (×2): .5 mg via INTRAVENOUS

## 2024-12-13 MED ORDER — VASOPRESSIN 20 UNIT/ML IV SOLN
INTRAVENOUS | Status: AC
  Filled 2024-12-13: qty 1

## 2024-12-13 MED ORDER — ROCURONIUM BROMIDE 10 MG/ML (PF) SYRINGE
PREFILLED_SYRINGE | INTRAVENOUS | Status: DC | PRN
  Administered 2024-12-13 (×3): 20 mg via INTRAVENOUS
  Administered 2024-12-13: 14:00:00 80 mg via INTRAVENOUS

## 2024-12-13 MED ORDER — CEFAZOLIN SODIUM-DEXTROSE 2-4 GM/100ML-% IV SOLN
2.0000 g | INTRAVENOUS | Status: AC
  Administered 2024-12-13: 14:00:00 2 g via INTRAVENOUS
  Filled 2024-12-13: qty 100

## 2024-12-13 MED ORDER — SODIUM CHLORIDE 0.9 % IV SOLN: 10.0000 mL/h | Freq: Once | INTRAVENOUS | Status: DC

## 2024-12-13 MED ORDER — LACTATED RINGERS IV SOLN: INTRAVENOUS | Status: DC | PRN

## 2024-12-13 MED ORDER — BUPIVACAINE LIPOSOME 1.3 % IJ SUSP
INTRAMUSCULAR | Status: AC
  Filled 2024-12-13: qty 20

## 2024-12-13 MED ORDER — ALBUMIN HUMAN 5 % IV SOLN: INTRAVENOUS | Status: DC | PRN

## 2024-12-13 MED ORDER — PHENYLEPHRINE HCL-NACL 20-0.9 MG/250ML-% IV SOLN
INTRAVENOUS | Status: DC | PRN
  Administered 2024-12-13: 16:00:00 35 ug/min via INTRAVENOUS

## 2024-12-13 MED ORDER — CEFAZOLIN SODIUM-DEXTROSE 1-4 GM/50ML-% IV SOLN
1.0000 g | Freq: Two times a day (BID) | INTRAVENOUS | Status: AC
  Administered 2024-12-14 (×2): 1 g via INTRAVENOUS
  Filled 2024-12-13 (×2): qty 50

## 2024-12-13 MED ORDER — ROCURONIUM BROMIDE 10 MG/ML (PF) SYRINGE
PREFILLED_SYRINGE | INTRAVENOUS | Status: AC
  Filled 2024-12-13: qty 10

## 2024-12-13 MED ORDER — ORAL CARE MOUTH RINSE: 15.0000 mL | Freq: Once | OROMUCOSAL | Status: AC

## 2024-12-13 MED ORDER — GELATIN ABSORBABLE 100 EX MISC
CUTANEOUS | Status: DC | PRN
  Administered 2024-12-13: 15:00:00 20 mL via TOPICAL

## 2024-12-13 MED ORDER — SUGAMMADEX SODIUM 200 MG/2ML IV SOLN
INTRAVENOUS | Status: DC | PRN
  Administered 2024-12-13: 17:00:00 200 mg via INTRAVENOUS

## 2024-12-13 MED ORDER — FENTANYL CITRATE (PF) 100 MCG/2ML IJ SOLN: 25.0000 ug | INTRAMUSCULAR | Status: DC | PRN

## 2024-12-13 MED ORDER — 0.9 % SODIUM CHLORIDE (POUR BTL) OPTIME
TOPICAL | Status: DC | PRN
  Administered 2024-12-13 (×2): 1000 mL

## 2024-12-13 MED ORDER — CHLORHEXIDINE GLUCONATE 0.12 % MT SOLN
OROMUCOSAL | Status: AC
  Administered 2024-12-13: 12:00:00 15 mL via OROMUCOSAL
  Filled 2024-12-13: qty 15

## 2024-12-13 MED ORDER — OXYCODONE HCL 5 MG PO TABS
15.0000 mg | ORAL_TABLET | ORAL | Status: DC | PRN
  Administered 2024-12-13 – 2024-12-18 (×11): 15 mg via ORAL
  Filled 2024-12-13 (×12): qty 3

## 2024-12-13 MED ORDER — LACTATED RINGERS IV SOLN: INTRAVENOUS | Status: DC

## 2024-12-13 MED ORDER — HYDROMORPHONE HCL 1 MG/ML IJ SOLN
INTRAMUSCULAR | Status: AC
  Filled 2024-12-13: qty 0.5

## 2024-12-13 MED ORDER — POTASSIUM CHLORIDE IN NACL 20-0.9 MEQ/L-% IV SOLN
INTRAVENOUS | Status: AC
  Filled 2024-12-13 (×2): qty 1000

## 2024-12-13 MED ORDER — OXYCODONE HCL 5 MG PO TABS: 5.0000 mg | ORAL_TABLET | Freq: Once | ORAL | Status: AC | PRN

## 2024-12-13 MED ORDER — PROPOFOL 10 MG/ML IV BOLUS
INTRAVENOUS | Status: AC
  Filled 2024-12-13: qty 20

## 2024-12-13 MED ORDER — BUPIVACAINE LIPOSOME 1.3 % IJ SUSP
INTRAMUSCULAR | Status: DC | PRN
  Administered 2024-12-13: 17:00:00 20 mL

## 2024-12-13 MED ORDER — LIDOCAINE-EPINEPHRINE 1 %-1:100000 IJ SOLN
INTRAMUSCULAR | Status: DC | PRN
  Administered 2024-12-13: 14:00:00 10 mL

## 2024-12-13 MED ORDER — FENTANYL CITRATE (PF) 250 MCG/5ML IJ SOLN
INTRAMUSCULAR | Status: AC
  Filled 2024-12-13: qty 5

## 2024-12-13 MED ADMIN — Fentanyl Citrate Preservative Free (PF) Inj 250 MCG/5ML: 100 ug | INTRAVENOUS | @ 14:00:00 | NDC 72572017125

## 2024-12-13 MED ADMIN — Fentanyl Citrate Preservative Free (PF) Inj 250 MCG/5ML: 75 ug | INTRAVENOUS | @ 15:00:00 | NDC 72572017125

## 2024-12-13 MED FILL — Rocuronium Bromide IV Soln Pref Syr 100 MG/10ML (10 MG/ML): INTRAVENOUS | Qty: 10 | Status: AC

## 2024-12-13 MED FILL — Hydromorphone HCl Inj 1 MG/ML: INTRAMUSCULAR | Qty: 0.5 | Status: AC

## 2024-12-13 MED FILL — Ondansetron HCl Inj 4 MG/2ML (2 MG/ML): INTRAMUSCULAR | Qty: 2 | Status: AC

## 2024-12-13 MED FILL — henylephrine-NaCl Pref Syr 0.8 MG/10ML-0.9% (80 MCG/ML): INTRAVENOUS | Qty: 20 | Status: AC

## 2024-12-13 SURGICAL SUPPLY — 59 items
Atec cross connector size 2 IMPLANT
BAG COUNTER SPONGE SURGICOUNT (BAG) ×1 IMPLANT
BENZOIN TINCTURE PRP APPL 2/3 (GAUZE/BANDAGES/DRESSINGS) ×1 IMPLANT
BLADE CLIPPER SURG (BLADE) IMPLANT
BLADE SURG 11 STRL SS (BLADE) ×1 IMPLANT
BUR MATCHSTICK NEURO 3.0 LAGG (BURR) ×1 IMPLANT
BUR PRECISION FLUTE 6.0 (BURR) ×1 IMPLANT
CANISTER SUCTION 3000ML PPV (SUCTIONS) ×1 IMPLANT
CNTNR URN SCR LID CUP LEK RST (MISCELLANEOUS) ×1 IMPLANT
CONNECTOR SP CROSS LCK 35-45 (Connector) IMPLANT
COVER BACK TABLE 60X90IN (DRAPES) ×1 IMPLANT
DERMABOND ADVANCED .7 DNX12 (GAUZE/BANDAGES/DRESSINGS) ×1 IMPLANT
DRAPE C-ARM 42X72 X-RAY (DRAPES) ×2 IMPLANT
DRAPE HALF SHEET 40X57 (DRAPES) IMPLANT
DRAPE LAPAROTOMY 100X72X124 (DRAPES) ×1 IMPLANT
DRAPE SURG 17X23 STRL (DRAPES) ×1 IMPLANT
DRSG OPSITE 4X5.5 SM (GAUZE/BANDAGES/DRESSINGS) IMPLANT
DRSG OPSITE POSTOP 4X10 (GAUZE/BANDAGES/DRESSINGS) IMPLANT
DURAPREP 26ML APPLICATOR (WOUND CARE) ×1 IMPLANT
ELECTRODE REM PT RTRN 9FT ADLT (ELECTROSURGICAL) ×1 IMPLANT
EVACUATOR 1/8 PVC DRAIN (DRAIN) ×1 IMPLANT
GAUZE 4X4 16PLY ~~LOC~~+RFID DBL (SPONGE) IMPLANT
GAUZE SPONGE 4X4 12PLY STRL (GAUZE/BANDAGES/DRESSINGS) ×1 IMPLANT
GLOVE BIO SURGEON STRL SZ7 (GLOVE) ×1 IMPLANT
GLOVE BIO SURGEON STRL SZ8 (GLOVE) ×2 IMPLANT
GLOVE BIOGEL PI IND STRL 7.0 (GLOVE) IMPLANT
GLOVE INDICATOR 8.5 STRL (GLOVE) ×2 IMPLANT
GOWN STRL REUS W/ TWL LRG LVL3 (GOWN DISPOSABLE) ×1 IMPLANT
GOWN STRL REUS W/ TWL XL LVL3 (GOWN DISPOSABLE) ×2 IMPLANT
GOWN STRL REUS W/TWL 2XL LVL3 (GOWN DISPOSABLE) IMPLANT
GRAFT BN 10X1XDBM MAGNIFUSE (Bone Implant) IMPLANT
GRAFT BNE MATRIX VG FRMBL L 10 (Bone Implant) IMPLANT
KIT BASIN OR (CUSTOM PROCEDURE TRAY) ×1 IMPLANT
KIT TURNOVER KIT B (KITS) ×1 IMPLANT
NDL HYPO 21X1.5 SAFETY (NEEDLE) ×1 IMPLANT
NDL HYPO 25X1 1.5 SAFETY (NEEDLE) ×1 IMPLANT
NEEDLE HYPO 21X1.5 SAFETY (NEEDLE) ×1 IMPLANT
NEEDLE HYPO 25X1 1.5 SAFETY (NEEDLE) ×1 IMPLANT
PACK LAMINECTOMY NEURO (CUSTOM PROCEDURE TRAY) ×1 IMPLANT
PAD ARMBOARD POSITIONER FOAM (MISCELLANEOUS) ×3 IMPLANT
ROD SP STRT INVICT 5.5X500 (Rod) IMPLANT
SCREW ILIAC PA 5.5X40 (Screw) IMPLANT
SCREW KODIAK 5.5X45 (Screw) IMPLANT
SET SCREW SPNE (Screw) IMPLANT
SOLN 0.9% NACL POUR BTL 1000ML (IV SOLUTION) ×1 IMPLANT
SOLN STERILE WATER BTL 1000 ML (IV SOLUTION) ×1 IMPLANT
SPIKE FLUID TRANSFER (MISCELLANEOUS) ×1 IMPLANT
SPONGE SURGIFOAM ABS GEL 100 (HEMOSTASIS) ×1 IMPLANT
SPONGE T-LAP 4X18 ~~LOC~~+RFID (SPONGE) IMPLANT
STRIP CLOSURE SKIN 1/2X4 (GAUZE/BANDAGES/DRESSINGS) ×2 IMPLANT
SUT VIC AB 0 CT1 18XCR BRD8 (SUTURE) ×2 IMPLANT
SUT VIC AB 2-0 CT1 18 (SUTURE) ×1 IMPLANT
SUT VIC AB 4-0 PS2 27 (SUTURE) ×1 IMPLANT
SYR 20ML LL LF (SYRINGE) ×1 IMPLANT
TOWEL GREEN STERILE (TOWEL DISPOSABLE) ×1 IMPLANT
TOWEL GREEN STERILE FF (TOWEL DISPOSABLE) ×1 IMPLANT
TRAY FOLEY MTR SLVR 16FR STAT (SET/KITS/TRAYS/PACK) ×1 IMPLANT
WIPE CHG 2% 2PK PREOPERATIVE (MISCELLANEOUS) IMPLANT
s rod 500mm IMPLANT

## 2024-12-13 NOTE — Anesthesia Procedure Notes (Signed)
 Arterial Line Insertion Start/End12/17/2025 1:50 PM, 12/13/2024 1:55 PM Performed by: Leopoldo Bruckner, MD, Monigue Spraggins, Bruckner PARAS, CRNA, CRNA  Patient location: OR. Preanesthetic checklist: patient identified, IV checked, site marked, risks and benefits discussed, surgical consent, monitors and equipment checked, pre-op evaluation, timeout performed and anesthesia consent Patient sedated Left, radial was placed Catheter size: 20 G Hand hygiene performed  and Seldinger technique used Allen's test indicative of satisfactory collateral circulation Attempts: 1 Following insertion, dressing applied (CHG infused dressing). Patient tolerated the procedure well with no immediate complications.

## 2024-12-13 NOTE — Transfer of Care (Signed)
 Immediate Anesthesia Transfer of Care Note  Patient: Zachary [name]  Procedure(s) Performed: Thoracic Seven-Thoracic Eight - Thoracic Eight-Thoracic Nine - Thoracic Nine-Thoracic Ten, Thoracic Ten-Thoracic Eleven pedicle screw fixation and posterolateral arthrodesis decompression of Thoracic Eight (Back)  Patient Location: PACU  Anesthesia Type:General  Level of Consciousness: drowsy  Airway & Oxygen  Therapy: Patient Spontanous Breathing and Patient connected to face mask oxygen   Post-op Assessment: Report given to RN and Post -op Vital signs reviewed and stable  Post vital signs: Reviewed and stable  Last Vitals:  Vitals Value Taken Time  BP 148/76 12/13/24 17:15  Temp    Pulse 93 12/13/24 17:22  Resp 16 12/13/24 17:22  SpO2 96 % 12/13/24 17:22  Vitals shown include unfiled device data.  Last Pain:  Vitals:   12/13/24 1212  TempSrc:   PainSc: 0-No pain         Complications: No notable events documented.

## 2024-12-13 NOTE — Progress Notes (Signed)
°  Progress Note   Patient: Zachary [name] FMW:[id number] DOB: [birth year] DOA: 12/09/2024     3 DOS: the patient was seen and examined on 12/13/2024   Brief hospital course: 78 year old history of hypertension, CKD stage IIIb, bladder cancer status post multiple bladder surgeries and TURBT 12/2022, low-grade papillary urothelial carcinoma, anxiety/depression, prediabetes, urinary retention requiring chronic intermittent catheterizations presenting with mechanical fall. The patient states that he was exiting his vehicle and walking up to his residence when he slipped and fell onto his back and hit his head. Patient denies any neurological deficits such as numbness or tingling in his extremities   In the ED, the patient was afebrile and hemodynamically stable with oxygen  saturation 97% on 2 L.  WBC 12.4, hemoglobin 13.5, platelet 253.  Sodium 142, potassium 3.8, bicarbonate 30, serum creatinine 2.04.  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  EDP spoke with neurosurgery, Dr. Gillie.  He recommended  Strict spinal precautions with transfer for MRI imaging and further evaluation by neurosurgery team.   Notably, the patient has CT of the brain which was negative for acute findings.  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  CT cervical spine was negative for acute findings.  Assessment and Plan: Acute T9 vertebral fracture -12/09/2024 CT T-spine as discussed above -Neurosurgery consulted. Recs noted for stabilization with surgery, planned for today -will follow up on therapy recs post-op   CKD stage IIIb - Baseline creatinine 1.8-2.0 - Patient follows with [facility] nephrology -Cr remains stable this AM   Essential hypertension - Continue ARB home dose - Continue carvedilol  3.125 mg twice daily   Mixed hyperlipidemia - Continue  statin   OSA -Continue CPAP (CPAP settings 4.5 per medical record)    Bladder cancer -s/p TURBT 12/2022 -Continue tamsulosin  - Follow-up [facility] urology   Urinary retention - Patient normally performs self catheterizations at home - Foley catheter placed in the ED   Subjective: Reports not sleeping at all overnight  Physical Exam: Vitals:   12/13/24 0736 12/13/24 1148 12/13/24 1150 12/13/24 1221  BP: (!) 175/85 (!) 168/107  (!) 167/94  Pulse:  95    Resp: 18 20    Temp: (!) 97.3 F (36.3 C) (!) 97.5 F (36.4 C)    TempSrc: Oral Oral    SpO2: 95% (!) 89% 92% 94%  Weight:  94.3 kg    Height:  6' 1.5 (1.867 m)     General exam: Awake, laying in bed, in nad Respiratory system: Normal respiratory effort, no wheezing Cardiovascular system: regular rate, s1, s2 Gastrointestinal system: Soft, nondistended, positive BS Central nervous system: CN2-12 grossly intact, strength intact Extremities: Perfused, no clubbing Skin: Normal skin turgor, no notable skin lesions seen Psychiatry: Mood normal // affect seems normal   Data Reviewed:  Labs reviewed: Na 139, K 3.8, Cr 1.75, WBC 10.4, hgb 11.2, Plts 214  Family Communication: Pt in room, family at bedside  Disposition: Status is: Inpatient Remains inpatient appropriate because: Severity of illness  Planned Discharge Destination: Pending post-op PT/OT eval    Author: Garnette Pelt, MD 12/13/2024 3:03 PM  For on call review [email or web address].

## 2024-12-13 NOTE — Progress Notes (Signed)
 Subjective: Patient reports condition of back symptoms  Objective: Vital signs in last 24 hours: Temp:  [97.3 F (36.3 C)-98.3 F (36.8 C)] 97.5 F (36.4 C) (12/17 1148) Pulse Rate:  [95-106] 95 (12/17 1148) Resp:  [18-20] 20 (12/17 1148) BP: (135-175)/(81-107) 167/94 (12/17 1221) SpO2:  [89 %-95 %] 94 % (12/17 1221) Weight:  [94.3 kg] 94.3 kg (12/17 1148)  Intake/Output from previous day: 12/16 0701 - 12/17 0700 In: 940 [P.O.:940] Out: 1850 [Urine:1850] Intake/Output this shift: No intake/output data recorded.  Strength 5 out of iliopsoas, quads, hamstrings, gastroc, tibialis, EHL.  Lab Results: Recent Labs    12/12/24 0458 12/13/24 0442  WBC 9.7 10.4  HGB 11.6* 11.2*  HCT 36.2* 34.3*  PLT 199 214   BMET Recent Labs    12/12/24 0458 12/13/24 0442  NA 140 139  K 3.7 3.8  CL 105 105  CO2 28 26  GLUCOSE 116* 103*  BUN 20 24*  CREATININE 1.73* 1.75*  CALCIUM 8.0* 8.5*    Studies/Results:   Assessment/Plan: 78 year old gentleman presented emergency room with T8-9 fracture dislocation injury Chance fracture superimposed progression of clinical syndrome and instability of his fracture I recommended thoracic stabilization with pedicle screws and T6 extensively gone over the risks and benefits of the operation with patient as well as perioperative course expectations of outcome and alternatives to surgery and he understood and agreed to proceed forward we also discussed possible decompression at T8.  LOS: 3 days     Zachary [name] 12/13/2024, 12:57 PM

## 2024-12-13 NOTE — Anesthesia Preprocedure Evaluation (Signed)
 Anesthesia Evaluation  Patient identified by MRN, date of birth, ID band Patient confused    Reviewed: Allergy & Precautions, NPO status , Patient's Chart, lab work & pertinent test results  History of Anesthesia Complications Negative for: history of anesthetic complications  Airway Mallampati: IV  TM Distance: <3 FB Neck ROM: Limited    Dental  (+) Dental Advisory Given, Teeth Intact   Pulmonary neg shortness of breath, sleep apnea and Continuous Positive Airway Pressure Ventilation , neg COPD, neg recent URI, former smoker   breath sounds clear to auscultation       Cardiovascular hypertension, Pt. on medications and Pt. on home beta blockers (-) angina (-) Past MI and (-) CHF (-) dysrhythmias + Valvular Problems/Murmurs  Rhythm:Regular  1. Left ventricular ejection fraction, by estimation, is 55 to 60%. The  left ventricle has normal function. The left ventricle has no regional  wall motion abnormalities. There is mild left ventricular hypertrophy.  Left ventricular diastolic parameters  are indeterminate.   2. Right ventricular systolic function is normal. The right ventricular  size is normal. There is normal pulmonary artery systolic pressure. The  estimated right ventricular systolic pressure is 18.1 mmHg.   3. The mitral valve is grossly normal. Trivial mitral valve  regurgitation.   4. The aortic valve is tricuspid. Aortic valve regurgitation is mild.  Mild to moderate aortic valve sclerosis/calcification is present, without  any evidence of aortic stenosis. Aortic regurgitation PHT measures 394  msec.   5. The inferior vena cava is normal in size with greater than 50%  respiratory variability, suggesting right atrial pressure of 3 mmHg.   Comparison(s): Previous Echo showed LV EF 65-70%, moderate LVH, no RWMA,  Grade I diastolic dysfunction, aortic sclerosis without stenosis and  trivial AI.    2022:  The left  ventricular ejection fraction is hyperdynamic (>65%).  Nuclear stress EF: 68%.  There was no ST segment deviation noted during stress.  The study is normal.  This is a low risk study.   Normal stress nuclear study with no ischemia or infarction.  Gated ejection fraction 68% with normal wall motion. Redell Shallow      Neuro/Psych  Headaches PSYCHIATRIC DISORDERS Anxiety Depression     Neuromuscular disease    GI/Hepatic negative GI ROS, Neg liver ROS,,,  Endo/Other  negative endocrine ROS    Renal/GU CRFRenal diseaseLab Results      Component                Value               Date                      NA                       139                 12/13/2024                K                        3.8                 12/13/2024                CO2  26                  12/13/2024                GLUCOSE                  103 (H)             12/13/2024                BUN                      24 (H)              12/13/2024                CREATININE               1.75 (H)            12/13/2024                CALCIUM                  8.5 (L)             12/13/2024                GFRNONAA                 39 (L)              12/13/2024                Musculoskeletal   Abdominal   Peds  Hematology  (+) Blood dyscrasia, anemia Lab Results      Component                Value               Date                      WBC                      10.4                12/13/2024                HGB                      11.2 (L)            12/13/2024                HCT                      34.3 (L)            12/13/2024                MCV                      91.0                12/13/2024                PLT                      214                 12/13/2024  Anesthesia Other Findings   Reproductive/Obstetrics                              Anesthesia Physical Anesthesia Plan  ASA: 3  Anesthesia Plan: General   Post-op Pain  Management: Precedex   Induction: Intravenous  PONV Risk Score and Plan: 3 and Ondansetron , Dexamethasone , Propofol  infusion and TIVA  Airway Management Planned: Oral ETT and Video Laryngoscope Planned  Additional Equipment:   Intra-op Plan:   Post-operative Plan: Extubation in OR  Informed Consent: I have reviewed the patients History and Physical, chart, labs and discussed the procedure including the risks, benefits and alternatives for the proposed anesthesia with the patient or authorized representative who has indicated his/her understanding and acceptance.     Dental advisory given  Plan Discussed with: CRNA  Anesthesia Plan Comments:          Anesthesia Quick Evaluation

## 2024-12-13 NOTE — Plan of Care (Signed)
  Problem: Clinical Measurements: Goal: Ability to maintain clinical measurements within normal limits will improve Outcome: Progressing Goal: Will remain free from infection Outcome: Progressing Goal: Cardiovascular complication will be avoided Outcome: Progressing   Problem: Nutrition: Goal: Adequate nutrition will be maintained Outcome: Progressing   

## 2024-12-13 NOTE — Op Note (Signed)
 Preoperative diagnosis: T9 Chance fracture.  Postoperative diagnosis: Same.  Procedure: T7-T11 posterior nonsegmental fixation with ATEC pedicle screws plate bilateral screws at T7-T8 T10 and T11.  2.  Decompressive laminectomy T8 with partial facetectomy.  3.  Posterolateral arthrodesis T7-8 T8-9 T9-10 T10-11 utilizing Vivigen and Magnifuse  Surgeon: Arley helling.  Assistant: Suzen Click.  Anesthesia: General.  EBL: 58  HPI: 78 year old gentleman who sustained T9 Chance fracture admitted to the ER over the weekend was stabilized worked up and presented for open fixation.  I extensively went over the risks and benefits of a T7-T11 posterior pedicle screw fixation with posterolateral arthrodesis and decompression at T8.  I also went over perioperative course expectations of outcome and alternatives to surgery and he understands and agrees to proceed forward.  Operative procedure: Patient was brought into the OR was induced under general anesthesia positioned prone on flat rolls his back was prepped and draped in routine sterile fashion preoperative x-ray localized appropriate level so after infiltration of 10 cc lidocaine  with epi midline incision was made and Bovie cautery was used to take down the subcutaneous tissue and subperiosteal dissection was carried out on the lamina of T7-T8 T9-T10-T11.  Interoperative x-ray confirmed identification of the appropriate level.  So then utilizing AP and lateral fluoroscopy pilot holes were drilled along the lateral aspects of the pedicle at all levels.  Then switching back and forth tween AP and lateral all 8 pedicle screws were placed all screws had excellent purchase postop imaging confirmed good position of the implants.  I then performed a decompressive laminectomy at T8 there was some hematoma directly visualized and there was also a dorsal presentation of the left T8 nerve root coming off the dorsal aspect of the dura which was somewhat of an  unusual exit point.  And after adequate decompression was achieved wound was copiously irrigated taken hemostasis was maintained I aggressively decorticated the lamina TP and facet complexes at T7-8 T8-9 T9-10 T10-11 packed the Vivigen and Magnifuse posterior laterally at these levels.  Then contoured the rods anchored the rods in place and tightened everything down.  I then placed a medium Hemovac drain injected Exparel  in the fascia and closed the wound in layers with interrupted Vicryl and a running 4 subcuticular.  Dermabond benzoin Steri-Strips and a sterile dressing was applied patient went to cover room in stable addition.  At the end the case all needle counts and sponge counts were correct.

## 2024-12-13 NOTE — Anesthesia Procedure Notes (Signed)
 Procedure Name: Intubation Date/Time: 12/13/2024 1:55 PM  Performed by: Mannie Krystal LABOR, CRNAPre-anesthesia Checklist: Patient identified, Emergency Drugs available, Suction available and Patient being monitored Patient Re-evaluated:Patient Re-evaluated prior to induction Oxygen  Delivery Method: Circle system utilized Preoxygenation: Pre-oxygenation with 100% oxygen  Induction Type: IV induction Ventilation: Mask ventilation without difficulty Laryngoscope Size: Glidescope and 4 Grade View: Grade I Tube type: Oral Tube size: 7.5 mm Number of attempts: 1 Airway Equipment and Method: Stylet and Oral airway Placement Confirmation: ETT inserted through vocal cords under direct vision, positive ETCO2 and breath sounds checked- equal and bilateral Secured at: 22 cm Tube secured with: Tape Dental Injury: Teeth and Oropharynx as per pre-operative assessment

## 2024-12-14 ENCOUNTER — Encounter (HOSPITAL_COMMUNITY): Payer: Self-pay | Admitting: Neurosurgery

## 2024-12-14 DIAGNOSIS — W19XXXA Unspecified fall, initial encounter: Secondary | ICD-10-CM | POA: Diagnosis not present

## 2024-12-14 DIAGNOSIS — S22079A Unspecified fracture of T9-T10 vertebra, initial encounter for closed fracture: Secondary | ICD-10-CM | POA: Diagnosis not present

## 2024-12-14 LAB — TYPE AND SCREEN
ABO/RH(D): O POS
Antibody Screen: NEGATIVE
Unit division: 0
Unit division: 0

## 2024-12-14 LAB — CBC
HCT: 33 % — ABNORMAL LOW (ref 39.0–52.0)
Hemoglobin: 10.8 g/dL — ABNORMAL LOW (ref 13.0–17.0)
MCH: 29 pg (ref 26.0–34.0)
MCHC: 32.7 g/dL (ref 30.0–36.0)
MCV: 88.7 fL (ref 80.0–100.0)
Platelets: 184 K/uL (ref 150–400)
RBC: 3.72 MIL/uL — ABNORMAL LOW (ref 4.22–5.81)
RDW: 14.7 % (ref 11.5–15.5)
WBC: 10.2 K/uL (ref 4.0–10.5)
nRBC: 0 % (ref 0.0–0.2)

## 2024-12-14 LAB — BPAM RBC
Blood Product Expiration Date: 202601142359
Blood Product Expiration Date: 202601142359
ISSUE DATE / TIME: 202512171627
ISSUE DATE / TIME: 202512171627
Unit Type and Rh: 5100
Unit Type and Rh: 5100

## 2024-12-14 LAB — COMPREHENSIVE METABOLIC PANEL WITH GFR
ALT: 8 U/L (ref 0–44)
AST: 24 U/L (ref 15–41)
Albumin: 2.9 g/dL — ABNORMAL LOW (ref 3.5–5.0)
Alkaline Phosphatase: 63 U/L (ref 38–126)
Anion gap: 9 (ref 5–15)
BUN: 26 mg/dL — ABNORMAL HIGH (ref 8–23)
CO2: 26 mmol/L (ref 22–32)
Calcium: 7.8 mg/dL — ABNORMAL LOW (ref 8.9–10.3)
Chloride: 105 mmol/L (ref 98–111)
Creatinine, Ser: 1.74 mg/dL — ABNORMAL HIGH (ref 0.61–1.24)
GFR, Estimated: 40 mL/min — ABNORMAL LOW (ref >60)
Glucose, Bld: 131 mg/dL — ABNORMAL HIGH (ref 70–99)
Potassium: 4 mmol/L (ref 3.5–5.1)
Sodium: 139 mmol/L (ref 135–145)
Total Bilirubin: 0.3 mg/dL (ref 0.0–1.2)
Total Protein: 5.1 g/dL — ABNORMAL LOW (ref 6.5–8.1)

## 2024-12-14 NOTE — NC FL2 (Signed)
 [place]  MEDICAID FL2 LEVEL OF CARE FORM     IDENTIFICATION  Patient Name: Zachary [name] Birthdate: 08/09/1946 Sex: male Admission Date (Current Location): 12/09/2024  [facility] and [place] Number:  Producer, Television/film/video and Address:  [facility] [place], [address] [place], [place] [address]      Provider Number: [id number]  Attending Physician Name and Address:  Cindy Garnette POUR, MD  Relative Name and Phone Number:  Alarik, Radu Spouse [phone]    Current Level of Care: Hospital Recommended Level of Care: Skilled Nursing Facility Prior Approval Number:    Date Approved/Denied:   PASRR Number: [id number]  Discharge Plan: SNF    Current Diagnoses: Patient Active Problem List   Diagnosis Date Noted   T9 vertebral fracture (HCC) 12/10/2024   Chronic kidney disease, stage 3b (HCC) 12/10/2024   Benign essential HTN 12/10/2024   Neuropathy 04/12/2024   Intertrochanteric fracture of left femur (HCC) 02/09/2023   Left ureteral stone 02/12/2020   Subclinical hyperthyroidism 09/20/2018   Prediabetes 09/20/2018   S/P total knee replacement, 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 02/07/2018   Primary osteoarthritis of left knee 02/01/2018   Spinal stenosis 04/12/2017   Postural hypotension 07/23/2016   Dysphagia 07/23/2016   Obstructive sleep apnea 07/21/2016   Hyperglycemia 07/21/2016   Anemia, unspecified 07/21/2016   Spinal stenosis of lumbar region 07/15/2016   Bladder tumor 07/27/2015   Epiretinal membrane (ERM) of left eye 03/14/2014   H/O retinal detachment 03/14/2014   Pseudophakia of both eyes 03/14/2014   Retinal detachment 03/14/2014   Visual distortion 03/14/2014   HNP (herniated nucleus pulposus), cervical 06/05/2013   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   Muscle spasms of neck 04/04/2013   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 anxiety 03/01/2013   Insomnia 03/01/2013   OA (osteoarthritis) of knee 02/22/2013   Iliotibial band syndrome of left side 11/22/2012    Localized, primary osteoarthritis of the ankle and foot 10/11/2012   Difficulty walking 08/17/2012   Peroneal tendinitis 08/16/2012   Precordial pain 02/09/2012   Essential hypertension, benign 02/09/2012   Mixed hyperlipidemia 02/09/2012   Dysthymia 02/04/2012   Abnormality of gait 12/23/2011   Lateral epicondylitis/tennis elbow 05/06/2011   TENOSYNOVITIS OF FOOT AND ANKLE 10/22/2010    Orientation RESPIRATION BLADDER Height & Weight     Self, Situation, Place  O2 Indwelling catheter Weight: 207 lb 14.3 oz (94.3 kg) Height:  6' 1.5 (186.7 cm)  BEHAVIORAL SYMPTOMS/MOOD NEUROLOGICAL BOWEL NUTRITION STATUS        Diet (see discharge summary)  AMBULATORY STATUS COMMUNICATION OF NEEDS Skin   Total Care Verbally Surgical wounds                       Personal Care Assistance Level of Assistance  Bathing, Feeding, Dressing, Total care Bathing Assistance: Maximum assistance Feeding assistance: Limited assistance Dressing Assistance: Maximum assistance Total Care Assistance: Maximum assistance   Functional Limitations Info  Sight, Hearing, Speech Sight Info: Adequate Hearing Info: Adequate Speech Info: Adequate    SPECIAL CARE FACTORS FREQUENCY  PT (By licensed PT), OT (By licensed OT)     PT Frequency: 5x week OT Frequency: 5x week            Contractures Contractures Info: Not present    Additional Factors Info  Allergies, Code Status Code Status Info: full Allergies Info: Poison Oak Extract, Sulfa Antibiotics, Sulfonamide Derivatives           Current Medications (12/14/2024):  This is the  current hospital active medication list Current Facility-Administered Medications  Medication Dose Route Frequency Provider Last Rate Last Admin   0.9 %  sodium chloride  infusion  10 mL/hr Intravenous Once Onetha Kuba, MD       acetaminophen  (TYLENOL ) tablet 650 mg  650 mg Oral Q6H PRN Onetha Kuba, MD   650 mg at 12/14/24 0815   Or   acetaminophen  (TYLENOL ) suppository  650 mg  650 mg Rectal Q6H PRN Onetha Kuba, MD       carvedilol  (COREG ) tablet 3.125 mg  3.125 mg Oral BID Onetha Kuba, MD   3.125 mg at 12/14/24 9048   Chlorhexidine  Gluconate Cloth 2 % PADS 6 each  6 each Topical Daily Onetha Kuba, MD   6 each at 12/14/24 1230   DULoxetine  (CYMBALTA ) DR capsule 60 mg  60 mg Oral BID Onetha Kuba, MD   60 mg at 12/14/24 9048   hydrALAZINE  (APRESOLINE ) injection 10 mg  10 mg Intravenous Q6H PRN Onetha Kuba, MD   10 mg at 12/10/24 0436   HYDROmorphone  (DILAUDID ) injection 1 mg  1 mg Intravenous Q3H PRN Onetha Kuba, MD   1 mg at 12/14/24 0557   irbesartan  (AVAPRO ) tablet 150 mg  150 mg Oral Daily Onetha Kuba, MD   150 mg at 12/14/24 9048   methocarbamol  (ROBAXIN ) tablet 500 mg  500 mg Oral TID Onetha Kuba, MD   500 mg at 12/14/24 9048   ondansetron  (ZOFRAN ) tablet 4 mg  4 mg Oral Q6H PRN Onetha Kuba, MD       Or   ondansetron  (ZOFRAN ) injection 4 mg  4 mg Intravenous Q6H PRN Onetha Kuba, MD   4 mg at 12/13/24 1840   oxyCODONE  (Oxy IR/ROXICODONE ) immediate release tablet 15 mg  15 mg Oral Q4H PRN Onetha Kuba, MD   15 mg at 12/14/24 1422   pravastatin  (PRAVACHOL ) tablet 10 mg  10 mg Oral QHS Onetha Kuba, MD   10 mg at 12/13/24 2255   tamsulosin  (FLOMAX ) capsule 0.4 mg  0.4 mg Oral Daily Onetha Kuba, MD   0.4 mg at 12/14/24 9048     Discharge Medications: Please see discharge summary for a list of discharge medications.  Relevant Imaging Results:  Relevant Lab Results:   Additional Information SSN: [id number]  Bridget Cordella Simmonds, LCSW

## 2024-12-14 NOTE — Evaluation (Signed)
 Occupational Therapy Evaluation Patient Details Name: Zachary [name] MRN: [id number] DOB: 10/21/1946 Today's Date: 12/14/2024   History of Present Illness   Pt is a 78 y/o male presenting after a fall at home. Found to have T7-9 fracture with edema and cord compression. Underwent T7-T11 posterior pedicle screw fixation with posterolateral arthrodesis and decompression at T8 on 12/17. PMH:  hypertension, OSA, chronic pain, anxiety, depression, arthritis     Clinical Impressions PTA, pt lives with spouse, typically Modified Independent with ADLs (aside from sock assist from wife) and mobility using RW outside of the home. Wife manages IADLs d/t pt's baseline memory deficits. Pt presents now with deficits in cognition, balance, strength and back pain. Pt premedicated prior to therapy attempts but pain persisted w/ lethargy and feelings of lightheadedness (BP WFL). Pt required Max A x 2 for bed mobility and assist for sitting balance; unable to progress OOB today. Pt requires extensive assist for ADLs bed level at this time. Patient will benefit from continued inpatient follow up therapy, <3 hours/day at DC.  SpO2 88% on RA at rest, maintained > 92% on 2 L O2     If plan is discharge home, recommend the following:   A lot of help with walking and/or transfers;Two people to help with walking and/or transfers;A lot of help with bathing/dressing/bathroom;Two people to help with bathing/dressing/bathroom     Functional Status Assessment   Patient has had a recent decline in their functional status and demonstrates the ability to make significant improvements in function in a reasonable and predictable amount of time.     Equipment Recommendations   Other (comment) (TBD pending progress)     Recommendations for Other Services         Precautions/Restrictions   Precautions Precautions: Fall;Back;Other (comment) Precaution Booklet Issued: Yes (comment) Recall of  Precautions/Restrictions: Impaired Precaution/Restrictions Comments: Hemovac drain, watch O2 (does not wear at baseline) Required Braces or Orthoses: Spinal Brace Spinal Brace: Thoracolumbosacral orthotic Restrictions Weight Bearing Restrictions Per Provider Order: No     Mobility Bed Mobility Overal bed mobility: Needs Assistance Bed Mobility: Supine to Sit, Sit to Supine     Supine to sit: Max assist, +2 for physical assistance, +2 for safety/equipment Sit to supine: Max assist, +2 for physical assistance, +2 for safety/equipment   General bed mobility comments: Pt typically sleeps in lift chair at home. use of helicopter method to bring LE to EOB and lift trunk. Heavy Max A x 2 all around    Transfers                   General transfer comment: unable to progress d/t pain and pt feeling like he would pass out      Balance Overall balance assessment: Needs assistance Sitting-balance support: Bilateral upper extremity supported, Feet supported Sitting balance-Leahy Scale: Poor                                     ADL either performed or assessed with clinical judgement   ADL Overall ADL's : Needs assistance/impaired Eating/Feeding: Set up;Bed level   Grooming: Set up;Bed level;Wash/dry face   Upper Body Bathing: Moderate assistance;Bed level   Lower Body Bathing: Total assistance;Bed level   Upper Body Dressing : Maximal assistance;Bed level   Lower Body Dressing: Total assistance;Bed level       Toileting- Clothing Manipulation and Hygiene: Total assistance;Bed level  Vision Baseline Vision/History: 1 Wears glasses Ability to See in Adequate Light: 0 Adequate Patient Visual Report: No change from baseline Vision Assessment?: No apparent visual deficits     Perception         Praxis         Pertinent Vitals/Pain Pain Assessment Pain Assessment: Faces Faces Pain Scale: Hurts worst Pain Location: back with  mobility Pain Descriptors / Indicators: Grimacing, Guarding, Moaning Pain Intervention(s): Monitored during session, Limited activity within patient's tolerance, Premedicated before session, Repositioned     Extremity/Trunk Assessment Upper Extremity Assessment Upper Extremity Assessment: Right hand dominant;Generalized weakness   Lower Extremity Assessment Lower Extremity Assessment: Defer to PT evaluation   Cervical / Trunk Assessment Cervical / Trunk Assessment: Back Surgery   Communication Communication Communication: Impaired Factors Affecting Communication: Hearing impaired   Cognition Arousal: Alert, Lethargic Behavior During Therapy: WFL for tasks assessed/performed, Flat affect Cognition: History of cognitive impairments             OT - Cognition Comments: wife reports hx of dementia w/ memory deficits. pt initially alert but sudden onset of lethargy and feeling like im floating likely due to pain meds. memory deficits evident with PLOF recall                 Following commands: Impaired Following commands impaired: Follows one step commands with increased time     Cueing  General Comments   Cueing Techniques: Verbal cues;Gestural cues  Wife at bedside. RN in to assess sudden lethargy. MD also in at time of eval. SpO2 on RA 88% at rest in bed. Maintained > 92% on 2 L O2. BP WFL throughout despite reports of feeling like he would pass out. HR 110s   Exercises     Shoulder Instructions      Home Living Family/patient expects to be discharged to:: Private residence Living Arrangements: Spouse/significant other Available Help at Discharge: Family;Available 24 hours/day Type of Home: House Home Access: Ramped entrance     Home Layout: One level     Bathroom Shower/Tub: Chief Strategy Officer: Handicapped height     Home Equipment: Tub bench;Rolling Walker (2 wheels);Lift chair;Hand held shower head          Prior  Functioning/Environment Prior Level of Function : Independent/Modified Independent             Mobility Comments: RW for mobility out house, no AD in home typically. wife reports pt with tendency to drag feet and often has to remind him to lift feet when walking ADLs Comments: Indep for bathing, dressing except for socks, able to toilet self. Wife manages IADLs including med mgmt    OT Problem List: Decreased strength;Decreased activity tolerance;Impaired balance (sitting and/or standing);Decreased cognition;Decreased safety awareness;Decreased knowledge of use of DME or AE;Decreased knowledge of precautions;Cardiopulmonary status limiting activity;Pain   OT Treatment/Interventions: Self-care/ADL training;Therapeutic exercise;Energy conservation;DME and/or AE instruction;Therapeutic activities;Patient/family education;Balance training      OT Goals(Current goals can be found in the care plan section)   Acute Rehab OT Goals Patient Stated Goal: pain control OT Goal Formulation: With patient/family Time For Goal Achievement: 12/28/24 Potential to Achieve Goals: Good ADL Goals Pt Will Perform Lower Body Bathing: with mod assist;sit to/from stand;with adaptive equipment Pt Will Perform Lower Body Dressing: with mod assist;sit to/from stand;sitting/lateral leans;with adaptive equipment Pt Will Transfer to Toilet: with mod assist;stand pivot transfer;bedside commode Additional ADL Goal #1: Pt to complete bed mobility with Mod A in prep for EOB/OOB  ADLs   OT Frequency:  Min 2X/week    Co-evaluation PT/OT/SLP Co-Evaluation/Treatment: Yes Reason for Co-Treatment: For patient/therapist safety;To address functional/ADL transfers PT goals addressed during session: Mobility/safety with mobility;Balance OT goals addressed during session: ADL's and self-care;Strengthening/ROM      AM-PAC OT 6 Clicks Daily Activity     Outcome Measure Help from another person eating meals?: A Little Help  from another person taking care of personal grooming?: A Little Help from another person toileting, which includes using toliet, bedpan, or urinal?: Total Help from another person bathing (including washing, rinsing, drying)?: A Lot Help from another person to put on and taking off regular upper body clothing?: A Lot Help from another person to put on and taking off regular lower body clothing?: Total 6 Click Score: 12   End of Session Equipment Utilized During Treatment: Oxygen  Nurse Communication: Mobility status  Activity Tolerance: Patient limited by pain Patient left: in bed;with call bell/phone within reach;with bed alarm set;with family/visitor present  OT Visit Diagnosis: Unsteadiness on feet (R26.81);Other abnormalities of gait and mobility (R26.89);Pain Pain - part of body:  (back)                Time: 9054-8973 OT Time Calculation (min): 41 min Charges:  OT General Charges $OT Visit: 1 Visit OT Evaluation $OT Eval Moderate Complexity: 1 Mod OT Treatments $Therapeutic Activity: 8-22 mins  Mliss NOVAK, OTR/L Acute Rehab Services Office: [phone]   Mliss Fish 12/14/2024, 10:46 AM

## 2024-12-14 NOTE — Progress Notes (Signed)
 Orthopedic Tech Progress Note Patient Details:  Zachary [name] [facility] 12/08/1946 [id number]  Ortho Devices Type of Ortho Device: Thoracolumbar corset (TLSO) Ortho Device/Splint Location: Back Ortho Device/Splint Interventions: Ordered, Adjustment   Post Interventions Patient Tolerated: Well  Zachary [name] [name] 12/14/2024, 8:47 AM

## 2024-12-14 NOTE — Evaluation (Signed)
 Physical Therapy Evaluation Patient Details Name: Zachary [name] MRN: [id number] DOB: 1946/11/03 Today's Date: 12/14/2024  History of Present Illness  78 y/o male presenting 12/09/24 after a fall at home; workup for T7-9 fx with edema and cord compression. S/p T7-T11 posterior pedicle screw fixation with posterolateral arthrodesis and decompression at T8 on 12/17. PMH includes HTN, OSA, chronic pain, anxiety, depression, arthritis .   Clinical Impression  Pt presents with an overall decrease in functional mobility secondary to above. PTA, pt mod indep with RW for community mobility, lives with wife who assists with iADLs. Educ re: precautions, positioning, activity recommendations and importance of mobility. Today, pt requiring maxA+2 for bed mobility, unable to progress to standing secondary to significant pain and feeling lightheaded. Pt limited by generalized weakness, pain, decreased activity tolerance, poor balance strategies, impaired cognition. Pt would benefit from post-acute rehab (< 3 hrs/day) to maximize functional mobility and independence prior to d/c home.   BP 142/67, HR 110s, SpO2 >/92% on 2L O2 [place]      If plan is discharge home, recommend the following: A lot of help with bathing/dressing/bathroom;Assistance with cooking/housework;Assist for transportation;Help with stairs or ramp for entrance;Two people to help with walking and/or transfers   Can travel by private vehicle   No    Equipment Recommendations  (TBD pending progress - potential wheelchair, [facility])  Recommendations for Other Services       Functional Status Assessment Patient has had a recent decline in their functional status and demonstrates the ability to make significant improvements in function in a reasonable and predictable amount of time.     Precautions / Restrictions Precautions Precautions: Fall;Back;Other (comment) Recall of Precautions/Restrictions: Impaired Precaution/Restrictions Comments:  Hemovac drain, watch SpO2 (does not wear at baseline) Required Braces or Orthoses: Spinal Brace Spinal Brace: Thoracolumbosacral orthotic Restrictions Weight Bearing Restrictions Per Provider Order: No      Mobility  Bed Mobility Overal bed mobility: Needs Assistance Bed Mobility: Supine to Sit, Sit to Supine     Supine to sit: Max assist, +2 for physical assistance, +2 for safety/equipment, HOB elevated Sit to supine: Max assist, +2 for physical assistance, +2 for safety/equipment   General bed mobility comments: Pt typically sleeps in lift chair at home. use of helicopter method requiring maxA+2 for BLE and trunk management; totalA+2 to scoot up and reposition in chair    Transfers                   General transfer comment: unable to progress d/t pain and pt feeling like he would pass out and stating, I'm dying    Ambulation/Gait                  Stairs            Wheelchair Mobility     Tilt Bed    Modified Rankin (Stroke Patients Only)       Balance Overall balance assessment: Needs assistance Sitting-balance support: Bilateral upper extremity supported, Feet supported Sitting balance-Leahy Scale: Poor                                       Pertinent Vitals/Pain Pain Assessment Pain Assessment: Faces Faces Pain Scale: Hurts worst Pain Location: back with mobility Pain Descriptors / Indicators: Grimacing, Guarding, Moaning Pain Intervention(s): Monitored during session, Premedicated before session, Limited activity within patient's tolerance, Repositioned    Home Living  Family/patient expects to be discharged to:: Private residence Living Arrangements: Spouse/significant other Available Help at Discharge: Family;Available 24 hours/day Type of Home: House Home Access: Ramped entrance       Home Layout: One level Home Equipment: Tub bench;Rolling Walker (2 wheels);Lift chair;Hand held shower head      Prior  Function Prior Level of Function : Needs assist  Cognitive Assist : ADLs (cognitive)           Mobility Comments: typically indep without DME inside, uses RW outside of house; wife reports tendency to drag feet and she often has to remind him to lift feet when walking. primarily sedentary, sits in recliner most of day. enjoys making walking sticks ADLs Comments: wife reports indep bathing, dressing and toilet; wife assists with lower body dressing and iADLs, including med management. wife reports h/o dementia but pt able to be left alone     Extremity/Trunk Assessment   Upper Extremity Assessment Upper Extremity Assessment: Right hand dominant;Generalized weakness    Lower Extremity Assessment Lower Extremity Assessment: Generalized weakness;Difficult to assess due to impaired cognition;RLE deficits/detail;LLE deficits/detail RLE Deficits / Details: functional observed BLE strength </ 3/5; formal testing limited by significant pain and impaired cognition LLE Deficits / Details: pt endorses LLE numbness/tingling, functional observed BLE strength </ 3/5; formal testing limited by significant pain and impaired cognition    Cervical / Trunk Assessment Cervical / Trunk Assessment: Back Surgery  Communication   Communication Communication: Impaired Factors Affecting Communication: Hearing impaired    Cognition Arousal: Lethargic, Alert, Suspect due to medications Behavior During Therapy: Flat affect, Restless   PT - Cognitive impairments: History of cognitive impairments                       PT - Cognition Comments: wife reports h/o dementia, including some memory deficits. apparent cognitive impairments exacerbated by significant pain and premedication with pain meds Following commands: Impaired Following commands impaired: Follows one step commands with increased time, Follows one step commands inconsistently     Cueing Cueing Techniques: Verbal cues, Gestural cues      General Comments General comments (skin integrity, edema, etc.): wife Rondi) present and supportive; RN and MD present during session as well and aware of current condition. SpO2 88% on RA, replaced 2L O2 [place]. pt c/o feeling he will pass out at EOB, BP from 142/67 up to 159/104, HR 110s (MD aware). initiated educ re: role of acute PT, POC, precautions, positioning, importance of mobility, potential d/c needs    Exercises     Assessment/Plan    PT Assessment Patient needs continued PT services  PT Problem List Decreased strength;Decreased range of motion;Decreased activity tolerance;Decreased balance;Decreased mobility;Decreased cognition;Decreased knowledge of use of DME;Decreased safety awareness;Decreased knowledge of precautions;Pain       PT Treatment Interventions DME instruction;Gait training;Stair training;Functional mobility training;Therapeutic activities;Therapeutic exercise;Balance training;Cognitive remediation;Neuromuscular re-education;Patient/family education    PT Goals (Current goals can be found in the Care Plan section)  Acute Rehab PT Goals Patient Stated Goal: decreased pain PT Goal Formulation: With patient Time For Goal Achievement: 12/28/24 Potential to Achieve Goals: Good    Frequency Min 2X/week     Co-evaluation PT/OT/SLP Co-Evaluation/Treatment: Yes Reason for Co-Treatment: For patient/therapist safety;To address functional/ADL transfers;Necessary to address cognition/behavior during functional activity PT goals addressed during session: Mobility/safety with mobility;Balance         AM-PAC PT 6 Clicks Mobility  Outcome Measure Help needed turning from your back to your side while in  a flat bed without using bedrails?: Total Help needed moving from lying on your back to sitting on the side of a flat bed without using bedrails?: Total Help needed moving to and from a bed to a chair (including a wheelchair)?: Total Help needed standing up from a chair  using your arms (e.g., wheelchair or bedside chair)?: Total Help needed to walk in hospital room?: Total Help needed climbing 3-5 steps with a railing? : Total 6 Click Score: 6    End of Session   Activity Tolerance: Patient limited by pain;Patient limited by lethargy Patient left: in bed;with call bell/phone within reach;with bed alarm set;with family/visitor present Nurse Communication: Mobility status PT Visit Diagnosis: Other abnormalities of gait and mobility (R26.89);Muscle weakness (generalized) (M62.81);Pain    Time: 1010-1028 PT Time Calculation (min) (ACUTE ONLY): 18 min   Charges:   PT Evaluation $PT Eval Moderate Complexity: 1 Mod   PT General Charges $$ ACUTE PT VISIT: 1 Visit       Darice Almas, PT, DPT Acute Rehabilitation Services  Personal: Secure Chat Rehab Office: [phone]  Darice LITTIE Almas 12/14/2024, 1:40 PM

## 2024-12-14 NOTE — Progress Notes (Signed)
 Subjective: Patient reports condition of back pain no leg pain  Objective: Vital signs in last 24 hours: Temp:  [97.2 F (36.2 C)-98.2 F (36.8 C)] 98.2 F (36.8 C) (12/18 0335) Pulse Rate:  [91-100] 98 (12/18 0335) Resp:  [16-25] 17 (12/17 2320) BP: (128-175)/(76-107) 135/85 (12/18 0335) SpO2:  [89 %-97 %] 95 % (12/18 0335) Weight:  [94.3 kg] 94.3 kg (12/17 1148)  Intake/Output from previous day: 12/17 0701 - 12/18 0700 In: 2956.3 [P.O.:320; I.V.:1456.3; Blood:630; IV Piggyback:550] Out: 2400 [Urine:1600; Blood:800] Intake/Output this shift: No intake/output data recorded.  Strength appears to be 4+ to 5 out of 5 somewhat effort dependent pain related but no focal deficits incision clean dry and intact  Lab Results: Recent Labs    12/13/24 0442 12/13/24 1620 12/14/24 0238  WBC 10.4  --  10.2  HGB 11.2* 8.8* 10.8*  HCT 34.3* 26.0* 33.0*  PLT 214  --  184   BMET Recent Labs    12/13/24 0442 12/13/24 1620 12/14/24 0238  NA 139 139 139  K 3.8 3.8 4.0  CL 105  --  105  CO2 26  --  26  GLUCOSE 103*  --  131*  BUN 24*  --  26*  CREATININE 1.75*  --  1.74*  CALCIUM 8.5*  --  7.8*    Studies/Results: DG Thoracic Spine 2 View Result Date: 12/13/2024 EXAM: 3 INTRAOPERATIVE FLUOROSCOPIC VIEWS XRAY OF THE THORACIC SPINE 12/13/2024 04:15:37 PM COMPARISON: None available. CLINICAL HISTORY: Elective surgery [id number]. FINDINGS: BONES: Vertebral body heights are maintained. Alignment is normal. Bilateral pedicle screws are seen at 4 levels. Please see operative report for further description. DISCS AND DEGENERATIVE CHANGES: No severe degenerative changes. Ligament is grossly anatomic. SOFT TISSUES: The visualized lungs are clear. IMPRESSION: 1. Bilateral pedicle screws at 4 thoracic levels, as above. 2. Thoracic spinal alignment is grossly anatomic. Electronically signed by: Greig Pique MD 12/13/2024 08:03 PM EST RP Workstation: [id number]   DG C-Arm 1-60 Min-No Report Result  Date: 12/13/2024 Fluoroscopy was utilized by the requesting physician.  No radiographic interpretation.   DG C-Arm 1-60 Min-No Report Result Date: 12/13/2024 Fluoroscopy was utilized by the requesting physician.  No radiographic interpretation.   DG C-Arm 1-60 Min-No Report Result Date: 12/13/2024 Fluoroscopy was utilized by the requesting physician.  No radiographic interpretation.    Assessment/Plan: Postop day 1 thoracic stabilization seems to be doing well will get fitted for brace and then mobilize with physical and Occupational Therapy.  LOS: 4 days     Zachary [name] 12/14/2024, 7:21 AM

## 2024-12-14 NOTE — TOC Initial Note (Addendum)
 Transition of Care [facility]) - Initial/Assessment Note    Patient Details  Name: Zachary [name] MRN: [id number] Date of Birth: Feb 24, 1946  Transition of Care [facility]) CM/SW Contact:    Bridget Cordella Simmonds, LCSW Phone Number: 12/14/2024, 2:56 PM  Clinical Narrative:   Pt oriented x3, able to participate on DC planning conversation.  Pt pastor also present, pt states pastor can remain present during discussion.  Pt agreeable to PT recommendation for SNF, from [facility], requesting [facility] or [facility].  Permission given to send out referral in the hub.  Permission given to speak with wife [name].  Pt from home with wife, no current services.  Referral sent out in hub for SNF.  CSW reached out to [facility] and [facility] to review.     1600: SNF auth request: message left with Tammy/HTA.  Facility choice pending.         Expected Discharge Plan: Skilled Nursing Facility Barriers to Discharge: Continued Medical Work up, SNF Pending bed offer   Patient Goals and CMS Choice Patient states their goals for this hospitalization and ongoing recovery are:: get back the way I was   Choice offered to / list presented to : Patient (requesting [facility] or [facility])      Expected Discharge Plan and Services In-house Referral: Clinical Social Work   Post Acute Care Choice: Skilled Nursing Facility Living arrangements for the past 2 months: Single Family Home                                      Prior Living Arrangements/Services Living arrangements for the past 2 months: Single Family Home Lives with:: Spouse Patient language and need for interpreter reviewed:: Yes Do you feel safe going back to the place where you live?: Yes      Need for Family Participation in Patient Care: Yes (Comment) Care giver support system in place?: Yes (comment) Current home services: Other (comment) (none) Criminal Activity/Legal Involvement Pertinent to Current Situation/Hospitalization: No  - Comment as needed  Activities of Daily Living   ADL Screening (condition at time of admission) Independently performs ADLs?: No Does the patient have a NEW difficulty with bathing/dressing/toileting/self-feeding that is expected to last >3 days?: No Does the patient have a NEW difficulty with getting in/out of bed, walking, or climbing stairs that is expected to last >3 days?: No Does the patient have a NEW difficulty with communication that is expected to last >3 days?: No Is the patient deaf or have difficulty hearing?: No Does the patient have difficulty seeing, even when wearing glasses/contacts?: No Does the patient have difficulty concentrating, remembering, or making decisions?: Yes  Permission Sought/Granted Permission sought to share information with : Family Supports Permission granted to share information with : Yes, Verbal Permission Granted  Share Information with NAME: wife Bruna  Permission granted to share info w AGENCY: SNF        Emotional Assessment Appearance:: Appears stated age Attitude/Demeanor/Rapport: Engaged Affect (typically observed): Appropriate, Pleasant Orientation: : Oriented to Self, Oriented to Place, Oriented to Situation      Admission diagnosis:  T9 vertebral fracture (HCC) [S22.079A] Fall, initial encounter [W19.XXXA] Closed fracture of ninth thoracic vertebra, unspecified fracture morphology, initial encounter [facility]) [S22.079A] Patient Active Problem List   Diagnosis Date Noted   T9 vertebral fracture (HCC) 12/10/2024   Chronic kidney disease, stage 3b (HCC) 12/10/2024   Benign essential HTN  12/10/2024   Neuropathy 04/12/2024   Intertrochanteric fracture of left femur (HCC) 02/09/2023   Left ureteral stone 02/12/2020   Subclinical hyperthyroidism 09/20/2018   Prediabetes 09/20/2018   S/P total knee replacement, 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 02/07/2018   Primary osteoarthritis of left knee 02/01/2018   Spinal stenosis 04/12/2017   Postural hypotension  07/23/2016   Dysphagia 07/23/2016   Obstructive sleep apnea 07/21/2016   Hyperglycemia 07/21/2016   Anemia, unspecified 07/21/2016   Spinal stenosis of lumbar region 07/15/2016   Bladder tumor 07/27/2015   Epiretinal membrane (ERM) of left eye 03/14/2014   H/O retinal detachment 03/14/2014   Pseudophakia of both eyes 03/14/2014   Retinal detachment 03/14/2014   Visual distortion 03/14/2014   HNP (herniated nucleus pulposus), cervical 06/05/2013    Class: Diagnosis of   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   Muscle spasms of neck 04/04/2013   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 anxiety 03/01/2013   Insomnia 03/01/2013   OA (osteoarthritis) of knee 02/22/2013   Iliotibial band syndrome of left side 11/22/2012   Localized, primary osteoarthritis of the ankle and foot 10/11/2012   Difficulty walking 08/17/2012   Peroneal tendinitis 08/16/2012   Precordial pain 02/09/2012   Essential hypertension, benign 02/09/2012   Mixed hyperlipidemia 02/09/2012   Dysthymia 02/04/2012   Abnormality of gait 12/23/2011   Lateral epicondylitis/tennis elbow 05/06/2011   TENOSYNOVITIS OF FOOT AND ANKLE 10/22/2010   PCP:  Shona Norleen PEDLAR, MD Pharmacy:   [facility] [phone] - [place], [place] - [address] [place] [address] Phone: [phone] Fax: [phone]  [facility] - [place], [place] - [address] [place] [address] Phone: [phone] Fax: [phone]     Social Drivers of Health (SDOH) Social History: SDOH Screenings   Food Insecurity: No Food Insecurity (12/10/2024)  Housing: Low Risk (12/10/2024)  Transportation Needs: No Transportation Needs (12/10/2024)  Utilities: Not At Risk (12/10/2024)  Depression (PHQ2-9): High Risk (07/26/2023)  Social Connections: Socially Integrated (12/10/2024)  Tobacco Use: Medium Risk (12/13/2024)   SDOH Interventions:     Readmission Risk  Interventions    12/10/2024    9:44 AM 02/11/2023   12:02 PM  Readmission Risk Prevention Plan  Medication Screening Complete   Transportation Screening Complete Complete  PCP or Specialist Appt within 5-7 Days  Complete  Home Care Screening  Complete  Medication Review (RN CM)  Complete

## 2024-12-14 NOTE — Progress Notes (Signed)
°  Progress Note   Patient: Zachary [name] FMW:[id number] DOB: 21-Dec-1946 DOA: 12/09/2024     4 DOS: the patient was seen and examined on 12/14/2024   Brief hospital course: 78 year old history of hypertension, CKD stage IIIb, bladder cancer status post multiple bladder surgeries and TURBT 12/2022, low-grade papillary urothelial carcinoma, anxiety/depression, prediabetes, urinary retention requiring chronic intermittent catheterizations presenting with mechanical fall. The patient states that he was exiting his vehicle and walking up to his residence when he slipped and fell onto his back and hit his head. Patient denies any neurological deficits such as numbness or tingling in his extremities   In the ED, the patient was afebrile and hemodynamically stable with oxygen  saturation 97% on 2 L.  WBC 12.4, hemoglobin 13.5, platelet 253.  Sodium 142, potassium 3.8, bicarbonate 30, serum creatinine 2.04.  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  EDP spoke with neurosurgery, Dr. Gillie.  He recommended  Strict spinal precautions with transfer for MRI imaging and further evaluation by neurosurgery team.   Notably, the patient has CT of the brain which was negative for acute findings.  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  CT cervical spine was negative for acute findings.  Assessment and Plan: Acute T9 vertebral fracture -12/09/2024 CT T-spine as discussed above -Neurosurgery consulted. Pt s/p surgery 12/17 -When seen with PT/OT at bedside, pt complaining of much pain -PT/OT recs for SNF   CKD stage IIIb - Baseline creatinine 1.8-2.0 - Patient follows with [facility] nephrology -Cr remains stable this AM   Essential hypertension - Continue ARB home dose - Continue carvedilol  3.125 mg twice daily   Mixed hyperlipidemia - Continue  statin   OSA -Continue CPAP (CPAP settings 4.5 per medical record)    Bladder cancer -s/p TURBT 12/2022 -Continue tamsulosin  - Follow-up [facility] urology   Urinary retention - Patient normally performs self catheterizations at home - Foley catheter placed in the ED   Subjective: Complaining of back pain this AM  Physical Exam: Vitals:   12/13/24 2320 12/14/24 0335 12/14/24 0750 12/14/24 1511  BP: (!) 156/90 135/85 (!) 140/90 113/68  Pulse: 91 98 (!) 102 (!) 103  Resp: 17  16 16   Temp: 97.9 F (36.6 C) 98.2 F (36.8 C) 98.2 F (36.8 C) 98.5 F (36.9 C)  TempSrc: Oral  Oral Oral  SpO2: 95% 95% 98% 95%  Weight:      Height:       General exam: Conversant, appears in acute pain Respiratory system: normal chest rise, clear, no audible wheezing Cardiovascular system: regular rhythm, s1-s2 Gastrointestinal system: Nondistended, nontender, pos BS Central nervous system: No seizures, no tremors Extremities: No cyanosis, no joint deformities Skin: No rashes, no pallor Psychiatry: Affect normal // mood seems appropriate  Data Reviewed:  Labs reviewed: Na 139, K 4.0, K 1.74, WBC 10.2, Hgb 10.8, Plts 184  Family Communication: Pt in room, family at bedside  Disposition: Status is: Inpatient Remains inpatient appropriate because: Severity of illness  Planned Discharge Destination: Skilled nursing facility    Author: Garnette Pelt, MD 12/14/2024 3:33 PM  For on call review [email or web address].

## 2024-12-14 NOTE — Plan of Care (Signed)
  Problem: Education: Goal: Knowledge of General Education information will improve Description: Including pain rating scale, medication(s)/side effects and non-pharmacologic comfort measures Outcome: Progressing   Problem: Clinical Measurements: Goal: Ability to maintain clinical measurements within normal limits will improve Outcome: Progressing Goal: Cardiovascular complication will be avoided Outcome: Progressing   Problem: Safety: Goal: Ability to remain free from injury will improve Outcome: Progressing

## 2024-12-14 NOTE — Care Management Important Message (Signed)
 Important Message  Patient Details  Name: Zachary [name] MRN: [id number] Date of Birth: 1946/04/22   Important Message Given:  Yes - Medicare IM     Jennie Laneta Dragon 12/14/2024, 3:28 PM

## 2024-12-14 NOTE — Anesthesia Postprocedure Evaluation (Signed)
 Anesthesia Post Note  Patient: Zachary [name]  Procedure(s) Performed: Thoracic Seven-Thoracic Eight - Thoracic Eight-Thoracic Nine - Thoracic Nine-Thoracic Ten, Thoracic Ten-Thoracic Eleven pedicle screw fixation and posterolateral arthrodesis decompression of Thoracic Eight (Back)     Patient location during evaluation: PACU Anesthesia Type: General Level of consciousness: awake and alert Pain management: pain level controlled Vital Signs Assessment: post-procedure vital signs reviewed and stable Respiratory status: spontaneous breathing, nonlabored ventilation, respiratory function stable and patient connected to nasal cannula oxygen  Cardiovascular status: blood pressure returned to baseline and stable Postop Assessment: no apparent nausea or vomiting Anesthetic complications: no   No notable events documented.              Elan Mcelvain D Nieve Rojero

## 2024-12-15 DIAGNOSIS — S22079A Unspecified fracture of T9-T10 vertebra, initial encounter for closed fracture: Secondary | ICD-10-CM | POA: Diagnosis not present

## 2024-12-15 DIAGNOSIS — W19XXXA Unspecified fall, initial encounter: Secondary | ICD-10-CM | POA: Diagnosis not present

## 2024-12-15 MED ORDER — BISACODYL 5 MG PO TBEC
5.0000 mg | DELAYED_RELEASE_TABLET | Freq: Once | ORAL | Status: AC
  Administered 2024-12-15: 5 mg via ORAL
  Filled 2024-12-15: qty 1

## 2024-12-15 MED ORDER — POLYETHYLENE GLYCOL 3350 17 G PO PACK
17.0000 g | PACK | Freq: Two times a day (BID) | ORAL | Status: DC
  Administered 2024-12-15 – 2024-12-17 (×5): 17 g via ORAL
  Filled 2024-12-15 (×5): qty 1

## 2024-12-15 NOTE — Progress Notes (Signed)
" °  Progress Note   Patient: Zachary [name] FMW:[id number] DOB: May 12, 1946 DOA: 12/09/2024     5 DOS: the patient was seen and examined on 12/15/2024   Brief hospital course: 78 year old history of hypertension, CKD stage IIIb, bladder cancer status post multiple bladder surgeries and TURBT 12/2022, low-grade papillary urothelial carcinoma, anxiety/depression, prediabetes, urinary retention requiring chronic intermittent catheterizations presenting with mechanical fall. The patient states that he was exiting his vehicle and walking up to his residence when he slipped and fell onto his back and hit his head. Patient denies any neurological deficits such as numbness or tingling in his extremities   In the ED, the patient was afebrile and hemodynamically stable with oxygen  saturation 97% on 2 L.  WBC 12.4, hemoglobin 13.5, platelet 253.  Sodium 142, potassium 3.8, bicarbonate 30, serum creatinine 2.04.  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  EDP spoke with neurosurgery, Dr. Gillie.  He recommended  Strict spinal precautions with transfer for MRI imaging and further evaluation by neurosurgery team.   Notably, the patient has CT of the brain which was negative for acute findings.  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  CT cervical spine was negative for acute findings.  Assessment and Plan: Acute T9 vertebral fracture -12/09/2024 CT T-spine as discussed above -Neurosurgery consulted. Pt s/p surgery 12/17 -Today pain better with back brace in place and sitting in chair -PT/OT recs for SNF, TOC following   CKD stage IIIb - Baseline creatinine 1.8-2.0 - Patient follows with [facility] nephrology -Cr had remained stable   Essential hypertension - Continue ARB home dose - Continue carvedilol  3.125 mg twice daily   Mixed  hyperlipidemia - Continue statin   OSA -Continue CPAP (CPAP settings 4.5 per medical record)    Bladder cancer -s/p TURBT 12/2022 -Continue tamsulosin  - Follow-up [facility] urology   Urinary retention - Patient normally performs self catheterizations at home - Cont foley cath   Subjective: Sitting in chair. Asking to be moved back to bed when seen  Physical Exam: Vitals:   12/14/24 1511 12/14/24 1947 12/15/24 0738 12/15/24 1424  BP: 113/68 120/81 132/65 118/78  Pulse: (!) 103  97 93  Resp: 16  16 16   Temp: 98.5 F (36.9 C) 99.4 F (37.4 C) 99.1 F (37.3 C) 98 F (36.7 C)  TempSrc: Oral     SpO2: 95% 93% 94% 90%  Weight:      Height:       General exam: Awake, laying in bed, in nad Respiratory system: Normal respiratory effort, no wheezing Cardiovascular system: regular rate, s1, s2 Gastrointestinal system: Soft, nondistended, positive BS Central nervous system: CN2-12 grossly intact, strength intact Extremities: Perfused, no clubbing Skin: Normal skin turgor, no notable skin lesions seen Psychiatry: Mood normal // no visual hallucinations   Data Reviewed:  There are no new results to review at this time.  Family Communication: Pt in room, family at bedside  Disposition: Status is: Inpatient Remains inpatient appropriate because: Severity of illness  Planned Discharge Destination: Skilled nursing facility    Author: Garnette Pelt, MD 12/15/2024 2:59 PM  For on call review [email or web address]  "

## 2024-12-15 NOTE — Progress Notes (Signed)
 Patient refuses soap suds enema at this time. He would like to take stool softeners first to see if they will be effective.

## 2024-12-15 NOTE — Progress Notes (Signed)
 Physical Therapy Treatment Patient Details Name: Zachary [name] MRN: [id number] DOB: April 21, 1946 Today's Date: 12/15/2024   History of Present Illness 78 y/o male presenting 12/09/24 after a fall at home; workup for T7-9 fx with edema and cord compression. S/p T7-T11 posterior pedicle screw fixation with posterolateral arthrodesis and decompression at T8 on 12/17. PMH includes HTN, OSA, chronic pain, anxiety, depression, arthritis .    PT Comments  Pt admitted with above diagnosis. Pt was able to sit EOB needing mod to min assist due to left lateral lean and posterior lean.  Pt was able to stand with max assist of 2 to power up and mod assist of 2 to step pivot to chair with RW with constant cues needed for upright posture as pt leans forward and shuffles feet. Per wife, pt had deficits with posture and gait PTA as well.  Continue to recommend post acute rehab < 3hours day.  Pt currently with functional limitations due to the deficits listed below (see PT Problem List). Pt will benefit from acute skilled PT to increase their independence and safety with mobility to allow discharge.       If plan is discharge home, recommend the following: A lot of help with bathing/dressing/bathroom;Assistance with cooking/housework;Assist for transportation;Help with stairs or ramp for entrance;Two people to help with walking and/or transfers   Can travel by private vehicle     No  Equipment Recommendations  Other (comment) (TBA next venue)    Recommendations for Other Services       Precautions / Restrictions Precautions Precautions: Fall;Back;Other (comment) Precaution Booklet Issued: Yes (comment) Recall of Precautions/Restrictions: Impaired Precaution/Restrictions Comments: Hemovac drain, watch SpO2 (does not wear at baseline) Required Braces or Orthoses: Spinal Brace Spinal Brace: Thoracolumbosacral orthotic Restrictions Weight Bearing Restrictions Per Provider Order: No     Mobility  Bed  Mobility Overal bed mobility: Needs Assistance Bed Mobility: Supine to Sit, Sit to Supine     Supine to sit: +2 for physical assistance, +2 for safety/equipment, HOB elevated, Used rails, Max assist     General bed mobility comments: Total assist to don brace at EOB. Pt assisted in log roll and needing max assist of 2 to come to side of bed.    Transfers Overall transfer level: Needs assistance Equipment used: Rolling walker (2 wheels) Transfers: Sit to/from Stand, Bed to chair/wheelchair/BSC Sit to Stand: Max assist, +2 physical assistance, From elevated surface   Step pivot transfers: Mod assist, +2 physical assistance       General transfer comment: Pt able to step around to the recliner from the bed with max assist of 2 to power up with use of pad and mod assist of 2 for pivoting with constant cues to stand tall and to sequence steps and RW.    Ambulation/Gait                   Stairs             Wheelchair Mobility     Tilt Bed    Modified Rankin (Stroke Patients Only)       Balance Overall balance assessment: Needs assistance Sitting-balance support: Bilateral upper extremity supported, Feet supported Sitting balance-Leahy Scale: Poor Sitting balance - Comments: Pt leaning to left significantly needing mod to min assist to sit EOB. Postural control: Posterior lean, Left lateral lean Standing balance support: Bilateral upper extremity supported, During functional activity, Reliant on assistive device for balance Standing balance-Leahy Scale: Poor Standing balance comment: Needs +2  mod assist to stand statically                            Communication Communication Communication: Impaired Factors Affecting Communication: Hearing impaired  Cognition Arousal: Alert Behavior During Therapy: Flat affect, Restless   PT - Cognitive impairments: History of cognitive impairments                       PT - Cognition Comments:  wife reports h/o dementia, including some memory deficits. Following commands: Impaired Following commands impaired: Follows one step commands with increased time, Follows one step commands inconsistently    Cueing Cueing Techniques: Verbal cues, Gestural cues  Exercises General Exercises - Lower Extremity Long Arc Quad: AROM, Both, 10 reps, Seated Hip Flexion/Marching: AROM, Both, 10 reps, Seated    General Comments General comments (skin integrity, edema, etc.): Wife present, VSS on RA      Pertinent Vitals/Pain Pain Assessment Pain Assessment: Faces Faces Pain Scale: Hurts worst Pain Location: back with mobility Pain Descriptors / Indicators: Grimacing, Guarding, Moaning Pain Intervention(s): Limited activity within patient's tolerance, Monitored during session, Premedicated before session, Repositioned    Home Living                          Prior Function            PT Goals (current goals can now be found in the care plan section) Acute Rehab PT Goals Patient Stated Goal: decreased pain Progress towards PT goals: Progressing toward goals    Frequency    Min 2X/week      PT Plan      Co-evaluation              AM-PAC PT 6 Clicks Mobility   Outcome Measure  Help needed turning from your back to your side while in a flat bed without using bedrails?: Total Help needed moving from lying on your back to sitting on the side of a flat bed without using bedrails?: Total Help needed moving to and from a bed to a chair (including a wheelchair)?: Total Help needed standing up from a chair using your arms (e.g., wheelchair or bedside chair)?: Total Help needed to walk in hospital room?: Total Help needed climbing 3-5 steps with a railing? : Total 6 Click Score: 6    End of Session Equipment Utilized During Treatment: Gait belt Activity Tolerance: Patient limited by pain;Patient limited by fatigue Patient left: with call bell/phone within  reach;with family/visitor present;in chair;with chair alarm set Nurse Communication: Mobility status;Need for lift equipment PT Visit Diagnosis: Other abnormalities of gait and mobility (R26.89);Muscle weakness (generalized) (M62.81);Pain     Time: 9056-8995 PT Time Calculation (min) (ACUTE ONLY): 21 min  Charges:    $Therapeutic Activity: 8-22 mins PT General Charges $$ ACUTE PT VISIT: 1 Visit                     Jashawna Reever M,PT Acute Rehab Services [phone]    Stephane JULIANNA Bevel 12/15/2024, 12:58 PM

## 2024-12-15 NOTE — Progress Notes (Signed)
 Mobility Specialist Progress Note:    12/15/24 1316  Mobility  Activity Mechanically lifted from chair to bed  Level of Assistance +2 (takes two people)  Assistive Device MaxiMove  Activity Response Tolerated well  Mobility Referral Yes  Mobility visit 1 Mobility  Mobility Specialist Start Time (ACUTE ONLY) 1302  Mobility Specialist Stop Time (ACUTE ONLY) 1316  Mobility Specialist Time Calculation (min) (ACUTE ONLY) 14 min   Received pt in chair requesting to get to bed. Pt required +2 w/ MaxiMove. Pt c/o back pain, otherwise tolerated well. Left pt in bed with alarm on. Personal belongings and call light within reach. All needs met. RN present.  Lavanda Pollack Mobility Specialist  Please contact via [employer] or  Rehab Office [phone]

## 2024-12-15 NOTE — Progress Notes (Signed)
" °   12/15/24 2010  BiPAP/CPAP/SIPAP  Reason BIPAP/CPAP not in use Non-compliant (Pt states he does not want to wear a CPAP while he is here. RT told patient that if he changes his mind to have his RN call RT and RT will bring him one.)    "

## 2024-12-15 NOTE — Plan of Care (Signed)
  Problem: Clinical Measurements: Goal: Ability to maintain clinical measurements within normal limits will improve Outcome: Progressing Goal: Will remain free from infection Outcome: Progressing   Problem: Nutrition: Goal: Adequate nutrition will be maintained Outcome: Progressing   Problem: Pain Managment: Goal: General experience of comfort will improve and/or be controlled Outcome: Progressing

## 2024-12-15 NOTE — TOC Progression Note (Addendum)
 Transition of Care [facility]) - Progression Note    Patient Details  Name: Zachary [name] MRN: [id number] Date of Birth: 15-Dec-1946  Transition of Care [facility]) CM/SW Contact  Bridget Cordella Simmonds, LCSW Phone Number: 12/15/2024, 1:44 PM  Clinical Narrative:   CSW spoke with pt regarding bed offer at [facility]  Pt lives in [place], first choice would be [place] [facility].  CSW has left several messages with [facility], admissions person out of office today, no response regarding potential bed offer from the back up admissions staff.    Earlier today, [facility] asked for new PT note, PT was informed, note now in.    1420: Message from [facility]: SNF auth approved, 7 days: [id number], PTAR approved: [id number].  They still need facility choice.   1505: Second voicemail left with [name].   Expected Discharge Plan: Skilled Nursing Facility Barriers to Discharge: Continued Medical Work up, SNF Pending bed offer               Expected Discharge Plan and Services In-house Referral: Clinical Social Work   Post Acute Care Choice: Skilled Nursing Facility Living arrangements for the past 2 months: Single Family Home                                       Social Drivers of Health (SDOH) Interventions SDOH Screenings   Food Insecurity: No Food Insecurity (12/10/2024)  Housing: Low Risk (12/10/2024)  Transportation Needs: No Transportation Needs (12/10/2024)  Utilities: Not At Risk (12/10/2024)  Depression (PHQ2-9): High Risk (07/26/2023)  Social Connections: Socially Integrated (12/10/2024)  Tobacco Use: Medium Risk (12/13/2024)    Readmission Risk Interventions    12/10/2024    9:44 AM 02/11/2023   12:02 PM  Readmission Risk Prevention Plan  Medication Screening Complete   Transportation Screening Complete Complete  PCP or Specialist Appt within 5-7 Days  Complete  Home Care Screening  Complete  Medication Review (RN CM)  Complete

## 2024-12-16 DIAGNOSIS — S22079A Unspecified fracture of T9-T10 vertebra, initial encounter for closed fracture: Secondary | ICD-10-CM | POA: Diagnosis not present

## 2024-12-16 DIAGNOSIS — W19XXXA Unspecified fall, initial encounter: Secondary | ICD-10-CM | POA: Diagnosis not present

## 2024-12-16 LAB — CBC
HCT: 33.3 % — ABNORMAL LOW (ref 39.0–52.0)
Hemoglobin: 11 g/dL — ABNORMAL LOW (ref 13.0–17.0)
MCH: 29.5 pg (ref 26.0–34.0)
MCHC: 33 g/dL (ref 30.0–36.0)
MCV: 89.3 fL (ref 80.0–100.0)
Platelets: 227 K/uL (ref 150–400)
RBC: 3.73 MIL/uL — ABNORMAL LOW (ref 4.22–5.81)
RDW: 14.4 % (ref 11.5–15.5)
WBC: 10.6 K/uL — ABNORMAL HIGH (ref 4.0–10.5)
nRBC: 0 % (ref 0.0–0.2)

## 2024-12-16 LAB — COMPREHENSIVE METABOLIC PANEL WITH GFR
ALT: 10 U/L (ref 0–44)
AST: 21 U/L (ref 15–41)
Albumin: 2.8 g/dL — ABNORMAL LOW (ref 3.5–5.0)
Alkaline Phosphatase: 73 U/L (ref 38–126)
Anion gap: 9 (ref 5–15)
BUN: 29 mg/dL — ABNORMAL HIGH (ref 8–23)
CO2: 26 mmol/L (ref 22–32)
Calcium: 8.4 mg/dL — ABNORMAL LOW (ref 8.9–10.3)
Chloride: 105 mmol/L (ref 98–111)
Creatinine, Ser: 1.75 mg/dL — ABNORMAL HIGH (ref 0.61–1.24)
GFR, Estimated: 39 mL/min — ABNORMAL LOW
Glucose, Bld: 101 mg/dL — ABNORMAL HIGH (ref 70–99)
Potassium: 3.8 mmol/L (ref 3.5–5.1)
Sodium: 139 mmol/L (ref 135–145)
Total Bilirubin: 0.3 mg/dL (ref 0.0–1.2)
Total Protein: 5.5 g/dL — ABNORMAL LOW (ref 6.5–8.1)

## 2024-12-16 NOTE — Plan of Care (Signed)
  Problem: Education: Goal: Knowledge of General Education information will improve Description: Including pain rating scale, medication(s)/side effects and non-pharmacologic comfort measures Outcome: Progressing   Problem: Clinical Measurements: Goal: Ability to maintain clinical measurements within normal limits will improve Outcome: Progressing Goal: Will remain free from infection Outcome: Progressing   Problem: Coping: Goal: Level of anxiety will decrease Outcome: Progressing   Problem: Elimination: Goal: Will not experience complications related to bowel motility Outcome: Progressing   Problem: Skin Integrity: Goal: Risk for impaired skin integrity will decrease Outcome: Progressing

## 2024-12-16 NOTE — Progress Notes (Signed)
" °  Progress Note   Patient: Zachary [name] FMW:[id number] DOB: 10/04/1946 DOA: 12/09/2024     6 DOS: the patient was seen and examined on 12/16/2024   Brief hospital course: 78 year old history of hypertension, CKD stage IIIb, bladder cancer status post multiple bladder surgeries and TURBT 12/2022, low-grade papillary urothelial carcinoma, anxiety/depression, prediabetes, urinary retention requiring chronic intermittent catheterizations presenting with mechanical fall. The patient states that he was exiting his vehicle and walking up to his residence when he slipped and fell onto his back and hit his head. Patient denies any neurological deficits such as numbness or tingling in his extremities   In the ED, the patient was afebrile and hemodynamically stable with oxygen  saturation 97% on 2 L.  WBC 12.4, hemoglobin 13.5, platelet 253.  Sodium 142, potassium 3.8, bicarbonate 30, serum creatinine 2.04.  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  EDP spoke with neurosurgery, Dr. Gillie.  He recommended  Strict spinal precautions with transfer for MRI imaging and further evaluation by neurosurgery team.   Notably, the patient has CT of the brain which was negative for acute findings.  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  CT cervical spine was negative for acute findings.  Assessment and Plan: Acute T9 vertebral fracture -12/09/2024 CT T-spine as discussed above -Neurosurgery consulted. Pt s/p surgery 12/17 -PT/OT recs for SNF, TOC following   CKD stage IIIb - Baseline creatinine 1.8-2.0 - Patient follows with [facility] nephrology -Cr remains stable currently   Essential hypertension - Continue ARB home dose - Continue carvedilol  3.125 mg twice daily -BP overall stable. Suspect intermittent high bp likely related to pain   Mixed  hyperlipidemia - Continue statin   OSA -Continue CPAP (CPAP settings 4.5 per medical record)    Bladder cancer -s/p TURBT 12/2022 -Continue tamsulosin  - Follow-up [facility] urology   Urinary retention - Patient normally performs self catheterizations at home - Cont foley cath  Constipation -Pt reports no BM in one week -no results despite BID miralax  and senna -will give trial of soap suds enema   Subjective: Complaining of continued pain and constipation  Physical Exam: Vitals:   12/15/24 1940 12/16/24 0357 12/16/24 0842 12/16/24 1517  BP: (!) 146/71 (!) 156/74 (!) 156/86 136/78  Pulse: (!) 101 100 (!) 58 93  Resp: 18 16 18 18   Temp: 97.8 F (36.6 C) 98.2 F (36.8 C) 98.6 F (37 C) (!) 97.4 F (36.3 C)  TempSrc: Oral Oral    SpO2: (!) 89% 97% 96% 98%  Weight:      Height:       General exam: Conversant, in no acute distress Respiratory system: normal chest rise, clear, no audible wheezing Cardiovascular system: regular rhythm, s1-s2 Gastrointestinal system: Nondistended, nontender, pos BS Central nervous system: No seizures, no tremors Extremities: No cyanosis, no joint deformities Skin: No rashes, no pallor Psychiatry: Affect normal // no auditory hallucinations   Data Reviewed:  Labs reviewed: Na 139, K 3.8, Cr 1.75, WBC 10.6, Hgb 11.0, Plts 227  Family Communication: Pt in room, family not at bedside  Disposition: Status is: Inpatient Remains inpatient appropriate because: Severity of illness  Planned Discharge Destination: Skilled nursing facility    Author: Garnette Pelt, MD 12/16/2024 3:40 PM  For on call review [email or web address].  "

## 2024-12-16 NOTE — Progress Notes (Signed)
 Occupational Therapy Treatment Patient Details Name: Zachary [name] MRN: [id number] DOB: 1946/03/16 Today's Date: 12/16/2024   History of present illness 78 y/o male presenting 12/09/24 after a fall at home; workup for T7-9 fx with edema and cord compression. S/p T7-T11 posterior pedicle screw fixation with posterolateral arthrodesis and decompression at T8 on 12/17. PMH includes HTN, OSA, chronic pain, anxiety, depression, arthritis .   OT comments  Pt progressing towards OT goals. Focus of session on progressing functional mobility to facilitate increased engagement in ADL tasks OOB, and addressing cognition impairments. Pt required up to total A for functional transfers this session. Dense mulitmodal cues required for sequencing tasks, problem solving, and reasoning. OT to continue to follow Pt acutely to facilitate progress towards goals. Continue per POC.       If plan is discharge home, recommend the following:  A lot of help with walking and/or transfers;Two people to help with walking and/or transfers;A lot of help with bathing/dressing/bathroom;Two people to help with bathing/dressing/bathroom   Equipment Recommendations  Other (comment) (TBD pending progress)    Recommendations for Other Services      Precautions / Restrictions Precautions Precautions: Fall;Back;Other (comment) Precaution Booklet Issued: Yes (comment) Recall of Precautions/Restrictions: Impaired Precaution/Restrictions Comments: Hemovac drain, watch SpO2 (does not wear at baseline) Required Braces or Orthoses: Spinal Brace Spinal Brace: Thoracolumbosacral orthotic Restrictions Weight Bearing Restrictions Per Provider Order: No       Mobility Bed Mobility Overal bed mobility: Needs Assistance Bed Mobility: Rolling, Sidelying to Sit Rolling: +2 for physical assistance, Mod assist, +2 for safety/equipment Sidelying to sit: Total assist, +2 for physical assistance, +2 for safety/equipment       General  bed mobility comments: Max A +2 for log roll out of bed. Increased assistance to Total A needed to bring BLE off of bed and elevate trunk.    Transfers Overall transfer level: Needs assistance Equipment used: Ambulation equipment used Transfers: Sit to/from Stand, Bed to chair/wheelchair/BSC Sit to Stand: Mod assist, +2 safety/equipment, +2 physical assistance, From elevated surface, Via lift equipment           General transfer comment: Pt utilized stedy for transfer to recliner. Mod A and BUE support for sit to stand x2. Increased posterior support required for controlled descent onto recliner. Transfer via Lift Equipment: Stedy   Balance Overall balance assessment: Needs assistance Sitting-balance support: Bilateral upper extremity supported, Feet supported Sitting balance-Leahy Scale: Poor Sitting balance - Comments: Pt with strong L lateral lean. Up to Mod A to maintain sitting balance EOB Postural control: Posterior lean, Left lateral lean Standing balance support: Bilateral upper extremity supported, During functional activity, Reliant on assistive device for balance Standing balance-Leahy Scale: Poor Standing balance comment: Dependent on DME and external assistance for standing balance.                           ADL either performed or assessed with clinical judgement   ADL Overall ADL's : Needs assistance/impaired                 Upper Body Dressing : Maximal assistance;Sitting                          Extremity/Trunk Assessment Upper Extremity Assessment Upper Extremity Assessment: Generalized weakness;Right hand dominant            Vision   Vision Assessment?: No apparent visual deficits   Perception  Praxis     Communication Communication Communication: Impaired Factors Affecting Communication: Hearing impaired   Cognition Arousal: Alert Behavior During Therapy: Flat affect Cognition: History of cognitive impairments              OT - Cognition Comments: wife reports hx of dementia w/ memory deficits. Pt initially appeared coherent with conversation with friends in room. Pt with tangential speech throughout session, slightly upset when asked clarifying questions. Decreased understanding of tasks even with dense and simple explanations                 Following commands: Impaired Following commands impaired: Follows one step commands with increased time, Follows one step commands inconsistently      Cueing   Cueing Techniques: Verbal cues, Gestural cues, Tactile cues, Visual cues  Exercises      Shoulder Instructions       General Comments VSS on 2L O2    Pertinent Vitals/ Pain       Pain Assessment Pain Assessment: Faces Faces Pain Scale: Hurts whole lot Pain Location: back with mobility Pain Descriptors / Indicators: Discomfort, Grimacing, Guarding, Moaning Pain Intervention(s): Limited activity within patient's tolerance, Monitored during session, Repositioned  Home Living                                          Prior Functioning/Environment              Frequency  Min 2X/week        Progress Toward Goals  OT Goals(current goals can now be found in the care plan section)  Progress towards OT goals: Progressing toward goals  Acute Rehab OT Goals Patient Stated Goal: to decrease pain OT Goal Formulation: With patient/family Time For Goal Achievement: 12/28/24 Potential to Achieve Goals: Good ADL Goals Pt Will Perform Lower Body Bathing: with mod assist;sit to/from stand;with adaptive equipment Pt Will Perform Lower Body Dressing: with mod assist;sit to/from stand;sitting/lateral leans;with adaptive equipment Pt Will Transfer to Toilet: with mod assist;stand pivot transfer;bedside commode Additional ADL Goal #1: Pt to complete bed mobility with Mod A in prep for EOB/OOB ADLs  Plan      Co-evaluation                 AM-PAC OT 6  Clicks Daily Activity     Outcome Measure   Help from another person eating meals?: A Little Help from another person taking care of personal grooming?: A Little Help from another person toileting, which includes using toliet, bedpan, or urinal?: Total Help from another person bathing (including washing, rinsing, drying)?: A Lot Help from another person to put on and taking off regular upper body clothing?: A Lot Help from another person to put on and taking off regular lower body clothing?: Total 6 Click Score: 12    End of Session Equipment Utilized During Treatment: Back brace  OT Visit Diagnosis: Unsteadiness on feet (R26.81);Other abnormalities of gait and mobility (R26.89);Pain   Activity Tolerance Patient tolerated treatment well   Patient Left in chair;with call bell/phone within reach;with chair alarm set   Nurse Communication Mobility status        Time: 8591-8567 OT Time Calculation (min): 24 min  Charges: OT General Charges $OT Visit: 1 Visit OT Treatments $Therapeutic Activity: 23-37 mins  Maurilio CROME, OTR/L.  [facility] Acute Rehabilitation  Office: [phone]   Maurilio PARAS Nehemiah Montee 12/16/2024,  3:38 PM

## 2024-12-16 NOTE — Progress Notes (Signed)
 Patient ID: Zachary [name], male   DOB: 01/02/1946, 78 y.o.   MRN: [id number] 3 status post posterior decompression and fusion from T7-T11 for T9 compression fracture with retropulsion.  Clinically he is moving his legs well but he is having considerable amount of pain.  He started to mobilize.  Continue supportive care.

## 2024-12-17 DIAGNOSIS — S22078D Other fracture of T9-T10 vertebra, subsequent encounter for fracture with routine healing: Secondary | ICD-10-CM | POA: Diagnosis not present

## 2024-12-17 MED ORDER — BISACODYL 10 MG RE SUPP
10.0000 mg | Freq: Once | RECTAL | Status: AC
  Administered 2024-12-17: 10 mg via RECTAL
  Filled 2024-12-17: qty 1

## 2024-12-17 MED ORDER — LACTULOSE 10 GM/15ML PO SOLN
20.0000 g | Freq: Three times a day (TID) | ORAL | Status: DC
  Administered 2024-12-17 – 2024-12-19 (×4): 20 g via ORAL
  Filled 2024-12-17 (×5): qty 30

## 2024-12-17 NOTE — Plan of Care (Signed)
  Problem: Clinical Measurements: Goal: Ability to maintain clinical measurements within normal limits will improve Outcome: Progressing Goal: Will remain free from infection Outcome: Progressing Goal: Diagnostic test results will improve Outcome: Progressing Goal: Respiratory complications will improve Outcome: Progressing Goal: Cardiovascular complication will be avoided Outcome: Progressing   Problem: Nutrition: Goal: Adequate nutrition will be maintained Outcome: Progressing   Problem: Coping: Goal: Level of anxiety will decrease Outcome: Progressing   Problem: Elimination: Goal: Will not experience complications related to urinary retention Outcome: Progressing   Problem: Pain Managment: Goal: General experience of comfort will improve and/or be controlled Outcome: Progressing   Problem: Safety: Goal: Ability to remain free from injury will improve Outcome: Progressing   Problem: Skin Integrity: Goal: Risk for impaired skin integrity will decrease Outcome: Progressing

## 2024-12-17 NOTE — Plan of Care (Signed)
  Problem: Education: Goal: Knowledge of General Education information will improve Description: Including pain rating scale, medication(s)/side effects and non-pharmacologic comfort measures Outcome: Progressing   Problem: Activity: Goal: Risk for activity intolerance will decrease Outcome: Progressing   Problem: Pain Managment: Goal: General experience of comfort will improve and/or be controlled Outcome: Progressing

## 2024-12-17 NOTE — Progress Notes (Signed)
 Patient ID: Zachary [name], male   DOB: 10-08-1946, 78 y.o.   MRN: [id number] BP (!) 169/116 (BP Location: Right Arm)   Pulse (!) 49   Temp 98 F (36.7 C)   Resp 17   Ht 6' 1.5 (1.867 m)   Wt 94.3 kg   SpO2 94%   BMI 27.06 kg/m  Alert, complains of significant pain in his operative area.  Wound is clean, dry, no signs of infection Moving lower extremities well Continue PT, OT

## 2024-12-17 NOTE — Progress Notes (Signed)
" °  Progress Note   Patient: Zachary [name] FMW:[id number] DOB: [birth year] DOA: 12/09/2024     7 DOS: the patient was seen and examined on 12/17/2024   Brief hospital course: No notes on file  Assessment and Plan: Acute T9 vertebral fracture -12/09/2024 CT T-spine as discussed above -Neurosurgery consulted. Pt s/p surgery 12/17 -PT/OT recs for SNF, TOC following   CKD stage IIIb - Baseline creatinine 1.8-2.0 - Patient follows with [facility] nephrology -Cr remains stable currently with creatinine 1.74   Essential hypertension - Continue ARB home dose - Continue carvedilol  3.125 mg twice daily -BP overall stable. Suspect intermittent high bp likely related to pain   Mixed hyperlipidemia - Continue statin   OSA -Continue CPAP (CPAP settings 4.5 per medical record)    Bladder cancer -s/p TURBT 12/2022 -Continue tamsulosin  - Follow-up [facility] urology   Urinary retention - Patient normally performs self catheterizations at home - Cont foley cath  Constipation -Pt reports no BM in one week -The patient is to receive lactulose  and bisacodyl  suppository. -will give trial of soap suds enema   Subjective: Complaining of continued pain and constipation  Physical Exam: Vitals:   12/16/24 2032 12/17/24 0529 12/17/24 0945 12/17/24 1629  BP: (!) 155/78 (!) 149/105 (!) 169/116 (!) 151/89  Pulse: 98 100 (!) 49 93  Resp: 18 18 17 17   Temp: 98.1 F (36.7 C) 97.7 F (36.5 C) 98 F (36.7 C) 98.1 F (36.7 C)  TempSrc:  Oral    SpO2: 97% 96% 94% 97%  Weight:      Height:       Exam:  Constitutional:  The patient is awake, alert, and oriented x 3. The patient is complaining of pain.  Respiratory:  No increased work of breathing. No wheezes, rales, or rhonchi No tactile fremitus Cardiovascular:  Regular rate and rhythm No murmurs, ectopy, or gallups. No lateral PMI. No thrills. Abdomen:  Abdomen is soft, non-tender, non-distended No hernias, masses, or  organomegaly Normoactive bowel sounds.  Musculoskeletal:  No cyanosis, clubbing, or edema Skin:  No rashes, lesions, ulcers palpation of skin: no induration or nodules Neurologic:  CN 2-12 intact Sensation all 4 extremities intact Psychiatric:  Mental status Mood, affect appropriate Orientation to person, place, time  judgment and insight appear intact   Data Reviewed:  Labs reviewed: Na 139, K 3.8, Cr 1.75, WBC 10.6, Hgb 11.0, Plts 227  Family Communication: Pt in room, family not at bedside  Disposition: Status is: Inpatient Remains inpatient appropriate because: Severity of illness  Planned Discharge Destination: Skilled nursing facility    Author: Soraida Vickers, DO 12/17/2024 5:15 PM  For on call review [email or web address]  "

## 2024-12-18 DIAGNOSIS — S22078D Other fracture of T9-T10 vertebra, subsequent encounter for fracture with routine healing: Secondary | ICD-10-CM | POA: Diagnosis not present

## 2024-12-18 LAB — CBC WITH DIFFERENTIAL/PLATELET
Abs Immature Granulocytes: 0.06 K/uL (ref 0.00–0.07)
Basophils Absolute: 0 K/uL (ref 0.0–0.1)
Basophils Relative: 0 %
Eosinophils Absolute: 0.5 K/uL (ref 0.0–0.5)
Eosinophils Relative: 5 %
HCT: 32.7 % — ABNORMAL LOW (ref 39.0–52.0)
Hemoglobin: 10.9 g/dL — ABNORMAL LOW (ref 13.0–17.0)
Immature Granulocytes: 1 %
Lymphocytes Relative: 12 %
Lymphs Abs: 1.2 K/uL (ref 0.7–4.0)
MCH: 29.5 pg (ref 26.0–34.0)
MCHC: 33.3 g/dL (ref 30.0–36.0)
MCV: 88.6 fL (ref 80.0–100.0)
Monocytes Absolute: 0.8 K/uL (ref 0.1–1.0)
Monocytes Relative: 8 %
Neutro Abs: 7.3 K/uL (ref 1.7–7.7)
Neutrophils Relative %: 74 %
Platelets: 278 K/uL (ref 150–400)
RBC: 3.69 MIL/uL — ABNORMAL LOW (ref 4.22–5.81)
RDW: 13.4 % (ref 11.5–15.5)
WBC: 9.9 K/uL (ref 4.0–10.5)
nRBC: 0 % (ref 0.0–0.2)

## 2024-12-18 LAB — BASIC METABOLIC PANEL WITH GFR
Anion gap: 7 (ref 5–15)
BUN: 28 mg/dL — ABNORMAL HIGH (ref 8–23)
CO2: 30 mmol/L (ref 22–32)
Calcium: 8.4 mg/dL — ABNORMAL LOW (ref 8.9–10.3)
Chloride: 102 mmol/L (ref 98–111)
Creatinine, Ser: 1.66 mg/dL — ABNORMAL HIGH (ref 0.61–1.24)
GFR, Estimated: 42 mL/min — ABNORMAL LOW
Glucose, Bld: 93 mg/dL (ref 70–99)
Potassium: 4 mmol/L (ref 3.5–5.1)
Sodium: 139 mmol/L (ref 135–145)

## 2024-12-18 MED ORDER — OXYCODONE HCL 10 MG PO TABS: 10.0000 mg | ORAL_TABLET | Freq: Four times a day (QID) | ORAL | 0 refills | Status: DC | PRN

## 2024-12-18 MED ORDER — METHOCARBAMOL 500 MG PO TABS: 500.0000 mg | ORAL_TABLET | Freq: Three times a day (TID) | ORAL | 0 refills | Status: DC

## 2024-12-18 MED ORDER — IRBESARTAN 150 MG PO TABS: 150.0000 mg | ORAL_TABLET | Freq: Every day | ORAL | 0 refills | Status: DC

## 2024-12-18 MED ORDER — CARVEDILOL 6.25 MG PO TABS
6.2500 mg | ORAL_TABLET | Freq: Two times a day (BID) | ORAL | Status: DC
  Administered 2024-12-18 – 2024-12-19 (×2): 6.25 mg via ORAL
  Filled 2024-12-18 (×2): qty 1

## 2024-12-18 NOTE — Progress Notes (Signed)
 Physical Therapy Treatment Patient Details Name: Zachary [name] MRN: [id number] DOB: March 01, 1946 Today's Date: 12/18/2024   History of Present Illness 78 y/o male presenting 12/09/24 after a fall at home; workup for T7-9 fx with edema and cord compression. S/p T7-T11 posterior pedicle screw fixation with posterolateral arthrodesis and decompression at T8 on 12/17. PMH includes HTN, OSA, chronic pain, anxiety, depression, arthritis .    PT Comments  Pt continues to report significant pain. Pt had partially removed his TLSO prior to PT arrival due to discomfort from the brace. PT provides reinforcement of education regarding brace use. Pt demonstrates improvement in sit to stand and step pivot transfers quality, requiring less physical assistance, however he remains at a high risk for falls due to persistent posterior lean. Patient will benefit from continued inpatient follow up therapy, <3 hours/day.    If plan is discharge home, recommend the following: A lot of help with bathing/dressing/bathroom;Assistance with cooking/housework;Assist for transportation;Help with stairs or ramp for entrance;Two people to help with walking and/or transfers   Can travel by private vehicle     No  Equipment Recommendations  Wheelchair (measurements PT);Wheelchair cushion (measurements PT);BSC/3in1    Recommendations for Other Services       Precautions / Restrictions Precautions Precautions: Fall;Back;Other (comment) Precaution Booklet Issued: Yes (comment) Recall of Precautions/Restrictions: Impaired Required Braces or Orthoses: Spinal Brace Spinal Brace: Thoracolumbosacral orthotic (pt had released LSO portion of brace when up in recliner prior to PT arrival) Restrictions Weight Bearing Restrictions Per Provider Order: No     Mobility  Bed Mobility Overal bed mobility: Needs Assistance Bed Mobility: Sit to Sidelying         Sit to sidelying: Mod assist      Transfers Overall transfer  level: Needs assistance Equipment used: Rolling walker (2 wheels) Transfers: Sit to/from Stand, Bed to chair/wheelchair/BSC Sit to Stand: Mod assist   Step pivot transfers: Min assist       General transfer comment: verbal cues for hand placement, visual cues to facilitate increased anterior weight shift in sit to stand. Pt with slow steps and trunk flexed over RW    Ambulation/Gait                   Stairs             Wheelchair Mobility     Tilt Bed    Modified Rankin (Stroke Patients Only)       Balance Overall balance assessment: Needs assistance Sitting-balance support: Single extremity supported, Bilateral upper extremity supported, Feet supported Sitting balance-Leahy Scale: Poor Sitting balance - Comments: reliant on UE support Postural control: Posterior lean Standing balance support: Bilateral upper extremity supported Standing balance-Leahy Scale: Poor Standing balance comment: minA with BUE support of RW                            Communication Communication Communication: Impaired Factors Affecting Communication: Hearing impaired  Cognition Arousal: Alert Behavior During Therapy: Anxious, Lability   PT - Cognitive impairments: History of cognitive impairments                       PT - Cognition Comments: wife reports h/o dementia, including some memory deficits. Following commands: Impaired Following commands impaired: Follows one step commands with increased time, Follows multi-step commands inconsistently    Cueing Cueing Techniques: Verbal cues, Visual cues, Tactile cues  Exercises      General  Comments General comments (skin integrity, edema, etc.): pt in NAD, incision sit does appear red with some raised swelling near the top of the incision. RN made aware.      Pertinent Vitals/Pain Pain Assessment Pain Assessment: Faces Faces Pain Scale: Hurts whole lot Pain Location: back Pain Descriptors /  Indicators: Sharp Pain Intervention(s): Patient requesting pain meds-RN notified    Home Living                          Prior Function            PT Goals (current goals can now be found in the care plan section) Acute Rehab PT Goals Patient Stated Goal: decreased pain Progress towards PT goals: Progressing toward goals    Frequency    Min 2X/week      PT Plan      Co-evaluation              AM-PAC PT 6 Clicks Mobility   Outcome Measure  Help needed turning from your back to your side while in a flat bed without using bedrails?: A Lot Help needed moving from lying on your back to sitting on the side of a flat bed without using bedrails?: A Lot Help needed moving to and from a bed to a chair (including a wheelchair)?: A Lot Help needed standing up from a chair using your arms (e.g., wheelchair or bedside chair)?: A Lot Help needed to walk in hospital room?: Total Help needed climbing 3-5 steps with a railing? : Total 6 Click Score: 10    End of Session Equipment Utilized During Treatment: Gait belt;Back brace Activity Tolerance: Patient limited by pain Patient left: in bed;with call bell/phone within reach;with bed alarm set Nurse Communication: Mobility status;Need for lift equipment PT Visit Diagnosis: Other abnormalities of gait and mobility (R26.89);Muscle weakness (generalized) (M62.81);Pain     Time: 8553-8493 PT Time Calculation (min) (ACUTE ONLY): 20 min  Charges:    $Therapeutic Activity: 8-22 mins PT General Charges $$ ACUTE PT VISIT: 1 Visit                     Bernardino JINNY Ruth, PT, DPT Acute Rehabilitation Office [phone]    Bernardino JINNY Ruth 12/18/2024, 3:33 PM

## 2024-12-18 NOTE — Plan of Care (Signed)
  Problem: Health Behavior/Discharge Planning: Goal: Ability to manage health-related needs will improve Outcome: Progressing   Problem: Clinical Measurements: Goal: Ability to maintain clinical measurements within normal limits will improve Outcome: Progressing   Problem: Activity: Goal: Risk for activity intolerance will decrease Outcome: Progressing   Problem: Nutrition: Goal: Adequate nutrition will be maintained Outcome: Progressing   Problem: Pain Managment: Goal: General experience of comfort will improve and/or be controlled Outcome: Progressing   Problem: Skin Integrity: Goal: Risk for impaired skin integrity will decrease Outcome: Progressing

## 2024-12-18 NOTE — Progress Notes (Signed)
 " Progress Note   Patient: Zachary [name] FMW:[id number] DOB: 1946/04/15 DOA: 12/09/2024     8 DOS: the patient was seen and examined on 12/18/2024   Brief hospital course: 78 year old history of hypertension, CKD stage IIIb, bladder cancer status post multiple bladder surgeries and TURBT 12/2022, low-grade papillary urothelial carcinoma, anxiety/depression, prediabetes, urinary retention requiring chronic intermittent catheterizations presenting with mechanical fall. The patient states that he was exiting his vehicle and walking up to his residence when he slipped and fell onto his back and hit his head. Patient denies any neurological deficits such as numbness or tingling in his extremities    In the ED, the patient was afebrile and hemodynamically stable with oxygen  saturation 97% on 2 L.  WBC 12.4, hemoglobin 13.5, platelet 253.  Sodium 142, potassium 3.8, bicarbonate 30, serum creatinine 2.04.   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  EDP spoke with neurosurgery, Dr. Gillie.  He recommended  Strict spinal precautions with transfer for MRI imaging and further evaluation by neurosurgery team.    Notably, the patient has CT of the brain which was negative for acute findings.  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  CT cervical spine was negative for acute findings.  The patient is s/p posterior decompression and fusion from T7-T11 for T9 compression fracture with retropulsion. On 12/13/2024.  He continues to have pain with mobilization, but is improving.   Notified by LPN of area of erythema at incision site. Small amount of drainage on steri-strips. Elsner notified.  The patient has been cleared for discharge by neurosurgery. He is medically cleared for discharge. He will have a bed available at [facility] on 12/19/2024. Patient will  need to bring his own CPAP.   Assessment and Plan: Acute T9 vertebral fracture -12/09/2024 CT T-spine as discussed above -Neurosurgery consulted. Pt s/p surgery 12/17. -Patient cleared for discharge by neurosurgery 12/18/2024. -Bed available at [facility] on 12/19/2024 for rehab.  CKD stage IIIb - Baseline creatinine 1.8-2.0 - Patient follows with [facility] nephrology -Cr remains stable currently with creatinine 1.66   Essential hypertension - Continue ARB home dose - BP has been high as has the patient's HR. Will increase carvedilol  to 6.25 bid. -BP overall stable. Suspect intermittent high bp likely related to pain   Mixed hyperlipidemia - Continue statin   OSA -Continue CPAP (CPAP settings 4.5 per medical record)    Bladder cancer -s/p TURBT 12/2022 -Continue tamsulosin  - Follow-up [facility] urology   Urinary retention - Patient normally performs self catheterizations at home - Cont foley cath  Constipation -BM on 12/22 after none for one week. -The patient is to receive lactulose  bid to stop with more than 2 large BM's.   Subjective: No new complaints. Resting comfortably.  Physical Exam: Vitals:   12/17/24 1629 12/17/24 1932 12/18/24 0451 12/18/24 0801  BP: (!) 151/89 (!) 158/92 (!) 145/75 (!) 150/127  Pulse: 93 95 92 97  Resp: 17 16 18 18   Temp: 98.1 F (36.7 C) 98.5 F (36.9 C) 98.2 F (36.8 C) 98.1 F (36.7 C)  TempSrc: Oral Oral Oral   SpO2: 97% 95% 92% 93%  Weight:      Height:       Exam:  Constitutional:  The patient is awake, alert, and oriented x 3. No acute distress. Respiratory:  No increased work of breathing. No wheezes, rales,  or rhonchi No tactile fremitus Cardiovascular:  Regular rate and rhythm No murmurs, ectopy, or gallups. No lateral PMI. No thrills. Abdomen:  Abdomen is soft, non-tender, non-distended No hernias, masses, or organomegaly Normoactive bowel sounds.  Musculoskeletal:  No cyanosis, clubbing, or  edema Neurologic:  CN 2-12 intact Sensation all 4 extremities intact Psychiatric:  Mental status Mood, affect appropriate Orientation to person, place, time  judgment and insight appear intact   Data Reviewed:  CBC, BMP  Family Communication: None available  Disposition: Status is: Inpatient Remains inpatient appropriate because: Severity of illness  Planned Discharge Destination: Skilled nursing facility    Author: Shea Swalley, DO 12/18/2024 4:02 PM  For on call review [email or web address].  "

## 2024-12-18 NOTE — Progress Notes (Signed)
 Patient ID: Zachary [name], male   DOB: [birth year], 78 y.o.   MRN: [id number] Patient's condition remained stable dressing is clean and dry.  Motor function is intact.  He is okay from a neurosurgical perspective he be transferred to a skilled nursing facility.

## 2024-12-18 NOTE — TOC Progression Note (Signed)
 Transition of Care [facility]) - Progression Note    Patient Details  Name: Zachary [name] MRN: [id number] Date of Birth: [birth year]  [facility]) CM/SW Contact  Bridget Cordella Simmonds, LCSW Phone Number: 12/18/2024, 1:55 PM  Clinical Narrative:   CSW spoke with [facility]: she is able to offer bed.  Pt will need to bring CPAP.  0955: CSW spoke with pt, he does have CPAP at home and can bring, does want to accept offer at [facility].    MD notified.  1355: TC [facility]: will not be able to receive pt until tomorrow.  MD notified.   Expected Discharge Plan: Skilled Nursing Facility Barriers to Discharge: Continued Medical Work up, SNF Pending bed offer               Expected Discharge Plan and Services In-house Referral: Clinical Social Work   Post Acute Care Choice: Skilled Nursing Facility Living arrangements for the past 2 months: Single Family Home                                       Social Drivers of Health (SDOH) Interventions SDOH Screenings   Food Insecurity: No Food Insecurity (12/10/2024)  Housing: Low Risk (12/10/2024)  Transportation Needs: No Transportation Needs (12/10/2024)  Utilities: Not At Risk (12/10/2024)  Depression (PHQ2-9): High Risk (07/26/2023)  Social Connections: Socially Integrated (12/10/2024)  Tobacco Use: Medium Risk (12/13/2024)    Readmission Risk Interventions    12/10/2024    9:44 AM 02/11/2023   12:02 PM  Readmission Risk Prevention Plan  Medication Screening Complete   Transportation Screening Complete Complete  PCP or Specialist Appt within 5-7 Days  Complete  Home Care Screening  Complete  Medication Review (RN CM)  Complete

## 2024-12-19 DIAGNOSIS — S22078D Other fracture of T9-T10 vertebra, subsequent encounter for fracture with routine healing: Secondary | ICD-10-CM | POA: Diagnosis not present

## 2024-12-19 LAB — BASIC METABOLIC PANEL WITH GFR
Anion gap: 7 (ref 5–15)
BUN: 24 mg/dL — ABNORMAL HIGH (ref 8–23)
CO2: 29 mmol/L (ref 22–32)
Calcium: 8.4 mg/dL — ABNORMAL LOW (ref 8.9–10.3)
Chloride: 101 mmol/L (ref 98–111)
Creatinine, Ser: 1.59 mg/dL — ABNORMAL HIGH (ref 0.61–1.24)
GFR, Estimated: 44 mL/min — ABNORMAL LOW
Glucose, Bld: 97 mg/dL (ref 70–99)
Potassium: 3.7 mmol/L (ref 3.5–5.1)
Sodium: 137 mmol/L (ref 135–145)

## 2024-12-19 LAB — CBC WITH DIFFERENTIAL/PLATELET
Abs Immature Granulocytes: 0.06 K/uL (ref 0.00–0.07)
Basophils Absolute: 0.1 K/uL (ref 0.0–0.1)
Basophils Relative: 1 %
Eosinophils Absolute: 0.6 K/uL — ABNORMAL HIGH (ref 0.0–0.5)
Eosinophils Relative: 6 %
HCT: 33.6 % — ABNORMAL LOW (ref 39.0–52.0)
Hemoglobin: 11 g/dL — ABNORMAL LOW (ref 13.0–17.0)
Immature Granulocytes: 1 %
Lymphocytes Relative: 16 %
Lymphs Abs: 1.5 K/uL (ref 0.7–4.0)
MCH: 29.3 pg (ref 26.0–34.0)
MCHC: 32.7 g/dL (ref 30.0–36.0)
MCV: 89.4 fL (ref 80.0–100.0)
Monocytes Absolute: 0.6 K/uL (ref 0.1–1.0)
Monocytes Relative: 7 %
Neutro Abs: 6.3 K/uL (ref 1.7–7.7)
Neutrophils Relative %: 69 %
Platelets: 298 K/uL (ref 150–400)
RBC: 3.76 MIL/uL — ABNORMAL LOW (ref 4.22–5.81)
RDW: 13.5 % (ref 11.5–15.5)
WBC: 9.1 K/uL (ref 4.0–10.5)
nRBC: 0 % (ref 0.0–0.2)

## 2024-12-19 MED ORDER — METHOCARBAMOL 500 MG PO TABS: 500.0000 mg | ORAL_TABLET | Freq: Three times a day (TID) | ORAL | Status: DC

## 2024-12-19 MED ORDER — ACETAMINOPHEN 500 MG PO TABS: 1000.0000 mg | ORAL_TABLET | Freq: Four times a day (QID) | ORAL | Status: DC | PRN

## 2024-12-19 MED ORDER — CARVEDILOL 6.25 MG PO TABS: 6.2500 mg | ORAL_TABLET | Freq: Two times a day (BID) | ORAL | Status: DC

## 2024-12-19 NOTE — Progress Notes (Signed)
 Occupational Therapy Treatment Patient Details Name: Zachary [name] MRN: [id number] DOB: [birth year] Today's Date: 12/19/2024   History of present illness 78 y/o male presenting 12/09/24 after a fall at home; workup for T7-9 fx with edema and cord compression. S/p T7-T11 posterior pedicle screw fixation with posterolateral arthrodesis and decompression at T8 on 12/17. PMH includes HTN, OSA, chronic pain, anxiety, depression, arthritis .   OT comments  Pt. Seen for skilled OT treatment session.  Pt. Required MAX A for all components of bed mobility (rolling, sidelying to/from sit, BLE management).  Pt. Able to sit eob with BUE support. Could not tolerate full donning of brace secondary to c/o pain and stating he needed to get back to bed.  Assisted back to bed. Heavy reliance on bed pad to pull pt. Up in bed and guide BLEs.  Pt. Fluctuating between calm conversation to yelling and crying.  Would go between these moods several times for duration of tx session.  SW present at end of session and confirms d/c later today.        If plan is discharge home, recommend the following:  A lot of help with walking and/or transfers;Two people to help with walking and/or transfers;A lot of help with bathing/dressing/bathroom;Two people to help with bathing/dressing/bathroom   Equipment Recommendations       Recommendations for Other Services      Precautions / Restrictions Precautions Precautions: Fall;Back;Other (comment) Precaution Booklet Issued: Yes (comment) Required Braces or Orthoses: Spinal Brace Spinal Brace: Thoracolumbosacral orthotic       Mobility Bed Mobility Overal bed mobility: Needs Assistance Bed Mobility: Rolling, Sidelying to Sit, Sit to Sidelying Rolling: Max assist, Used rails Sidelying to sit: Max assist     Sit to sidelying: Max assist General bed mobility comments: MAX A for all components of bed mobility.  pt. yelling, and tearful, then would calm but unable to follow  demo or verbal cues for sequencing and to assist self during mobility.  pt. verbalized fear of falling and dont throw me around i know all of you just throw me around.  provided reasurance to calm pt. and encouraged  him to take the lead with initiating movements of all extremities to ease fears    Transfers                   General transfer comment: no transfers, pt. requesting back to bed secondary to reported back pain     Balance Overall balance assessment: Needs assistance Sitting-balance support: Single extremity supported, Bilateral upper extremity supported, Feet supported Sitting balance-Leahy Scale: Poor Sitting balance - Comments: reliant on UE support Postural control: Posterior lean Standing balance support: Bilateral upper extremity supported                               ADL either performed or assessed with clinical judgement   ADL                                              Extremity/Trunk Assessment              Vision       Perception     Praxis     Communication Communication Communication: Impaired Factors Affecting Communication: Hearing impaired   Cognition Arousal: Alert Behavior During Therapy: Anxious, Lability  Cognition: History of cognitive impairments             OT - Cognition Comments: pt. fluctuating with appropriate conversation and awareness to confusion, tearful, yelling, then resume normal conversation.                 Following commands: Impaired Following commands impaired: Follows one step commands with increased time, Follows multi-step commands inconsistently      Cueing   Cueing Techniques: Verbal cues, Visual cues, Tactile cues  Exercises      Shoulder Instructions       General Comments      Pertinent Vitals/ Pain       Pain Assessment Pain Assessment: Faces Faces Pain Scale: Hurts whole lot Pain Location: back Pain Descriptors / Indicators: Sharp Pain  Intervention(s): Repositioned, Limited activity within patient's tolerance, Monitored during session  Home Living                                          Prior Functioning/Environment              Frequency  Min 2X/week        Progress Toward Goals  OT Goals(current goals can now be found in the care plan section)  Progress towards OT goals: Progressing toward goals     Plan      Co-evaluation                 AM-PAC OT 6 Clicks Daily Activity     Outcome Measure   Help from another person eating meals?: A Little Help from another person taking care of personal grooming?: A Little Help from another person toileting, which includes using toliet, bedpan, or urinal?: Total Help from another person bathing (including washing, rinsing, drying)?: A Lot Help from another person to put on and taking off regular upper body clothing?: A Lot Help from another person to put on and taking off regular lower body clothing?: Total 6 Click Score: 12    End of Session Equipment Utilized During Treatment: Back brace  OT Visit Diagnosis: Unsteadiness on feet (R26.81);Other abnormalities of gait and mobility (R26.89);Pain   Activity Tolerance Patient limited by pain   Patient Left in bed;with call bell/phone within reach;with bed alarm set   Nurse Communication Other (comment);Mobility status (rn states ok to work with pt., updated rn after session pt. states he could not tolerate eob/oob. returned to bed per pt. request)        Time: 9042-8987 OT Time Calculation (min): 15 min  Charges: OT General Charges $OT Visit: 1 Visit OT Treatments $Therapeutic Activity: 8-22 mins  Randall, COTA/L Acute Rehabilitation [phone]   CHRISTELLA Nest Lorraine-COTA/L  12/19/2024, 10:22 AM

## 2024-12-19 NOTE — Plan of Care (Signed)
  Problem: Clinical Measurements: Goal: Will remain free from infection Outcome: Progressing   Problem: Activity: Goal: Risk for activity intolerance will decrease Outcome: Progressing   Problem: Safety: Goal: Ability to remain free from injury will improve Outcome: Progressing   

## 2024-12-19 NOTE — Discharge Summary (Signed)
 " Physician Discharge Summary   Patient: Zachary [name] MRN: [id number] DOB: 14-Jun-1946  Admit date:     12/09/2024  Discharge date: 12/19/2024  Discharge Physician: Duffy Al-Sultani   PCP: Shona Norleen PEDLAR, MD   Recommendations at discharge:    Follow up with PCP in 1 week for routine post-hospital follow up. Monitor BP and adjust medications as necessary Follow up with neurosurgery as scheduled by neurosurgery team  Discharge Diagnoses: Principal Problem:   T9 vertebral fracture (HCC) Active Problems:   Chronic kidney disease, stage 3b (HCC)   Benign essential HTN   Hospital Course: Patient is a 78 year old male with PMHx of HTN, CKD stage IIIb, bladder cancer status post multiple bladder surgeries and TURBT (12/2022), low-grade papillary urethral carcinoma, anxiety/depression, prediabetes, urinary retention requiring intermittent catheterizations, who presented with mechanical fall.  Patient fell after exiting his vehicle while walking up to his residence when he slipped and fell on his back and hit his head.  Denied neurological deficits on arrival.  In the ED he was afebrile and hemodynamically stable with SpO2 97% on 2 L.  Labs showed WBC 12.4, hemoglobin 13.5, platelet 253.  CMP was unremarkable.  CT of the head, cervical spine, and lumbar spine showed no acute findings though CT lumbar spine noted hypertrophic bone causing severe spinal stenosis at L2-L3.  CT thoracic spine showed an acute T9 fracture with hyperextension consistent with AOSpine B3 injury with probable anterior longitudinal ligament tear.  MRI T-spine showed traumatic hyperextension injury with T9 vertebral body fracture, additional nondisplaced T8 and probable T7 fractures, anterior longitudinal ligament disruption at T8-9, and a T7-T8 central disc protrusion causing thoracic and cord compression with cord edema and possible focal cord damage, without significant retropulsion.  Patient was evaluated by neurosurgery and  ultimately underwent posterior decompression and fusion from T7-T11 on 12/13/2024.  He was evaluated by physical therapy and Occupational Therapy and recommended for SNF for rehab.  He was cleared for discharge by neurosurgery from surgical perspective and was medically cleared for discharge as well.  Other chronic problems addressed during the hospital:   CKD stage IIIb - Baseline creatinine 1.8-2.0 - Patient follows with [facility] nephrology - Stable   Essential hypertension - Continue home valsartan  320 mg daily.  Continue increased dose of Coreg  6.25 mg twice daily -Follow-up with PCP within 1 to 2 weeks of discharge for continued management of hypertension and medication titration if needed.   Mixed hyperlipidemia - Continue statin   OSA - Continue nighttime CPAP   Bladder cancer -s/p TURBT 12/2022  Chronic urinary retention - Patient normally performs self catheterizations at home - Was maintained on Foley catheter while hospitalized. Removed at the time of discharge. Patient to return to his normal schedule/rountine of 3-4x intermittent catheterizations a day       Consultants: Neurosurgery Procedures performed: 12/17 posterior decompression and fusion from T7-T11 Disposition: Skilled nursing facility Diet recommendation:  Diet Orders (From admission, onward)     Start     Ordered   12/13/24 2319  Diet regular Room service appropriate? Yes; Fluid consistency: Thin  Diet effective now       Question Answer Comment  Room service appropriate? Yes   Fluid consistency: Thin      12/13/24 2318            DISCHARGE MEDICATION: Allergies as of 12/19/2024       Reactions   Poison Oak Extract Hives, Itching   Sulfa Antibiotics Other (See Comments)  Unknown    Sulfonamide Derivatives Itching, Rash        Medication List     STOP taking these medications    loratadine 10 MG tablet Commonly known as: CLARITIN       TAKE these medications     acetaminophen  500 MG tablet Commonly known as: TYLENOL  Take 2 tablets (1,000 mg total) by mouth every 6 (six) hours as needed for mild pain (pain score 1-3), fever or headache. What changed:  when to take this reasons to take this   carvedilol  6.25 MG tablet Commonly known as: COREG  Take 1 tablet (6.25 mg total) by mouth 2 (two) times daily. What changed:  medication strength how much to take   dorzolamide-timolol 2-0.5 % ophthalmic solution Commonly known as: COSOPT Place 1 drop into both eyes 2 (two) times daily.   DULoxetine  60 MG capsule Commonly known as: CYMBALTA  Take 1 capsule (60 mg total) by mouth 2 (two) times daily.   Fish Oil 1000 MG Caps Take 1 capsule by mouth daily.   latanoprost 0.005 % ophthalmic solution Commonly known as: XALATAN Place 1 drop into both eyes at bedtime.   methocarbamol  500 MG tablet Commonly known as: ROBAXIN  Take 1 tablet (500 mg total) by mouth 3 (three) times daily.   multivitamin tablet Take 1 tablet by mouth daily.   Oxycodone  HCl 10 MG Tabs Take 1 tablet (10 mg total) by mouth every 6 (six) hours as needed.   pravastatin  10 MG tablet Commonly known as: PRAVACHOL  Take 10 mg by mouth at bedtime.   PRESERVISION AREDS PO Take 1 capsule by mouth in the morning and at bedtime.   multivitamin-lutein Caps capsule Take 1 capsule by mouth daily.   valsartan  320 MG tablet Commonly known as: DIOVAN  Take 320 mg by mouth daily.               Discharge Care Instructions  (From admission, onward)           Start     Ordered   12/19/24 0000  Discharge wound care:       Comments: Wound care  Every shift      Comments: Paint Surgical Incision site with betadine  and cover with gauze dressing.   12/19/24 1058             Discharge Exam: Filed Weights   12/09/24 2201 12/13/24 1148  Weight: 94.3 kg 94.3 kg   Blood pressure (!) 154/104, pulse 94, temperature 98.7 F (37.1 C), resp. rate 16, height 6' 1.5  (1.867 m), weight 94.3 kg, SpO2 91%.   Gen: NAD, alert, baseline confusion HEENT: NCAT, EOMI Neck: Supple, no JVD CV: RRR, no murmurs Resp: normal WOB, CTAB, no w/r/r Abd: Soft, NTND, no guarding Ext: No LE edema, pulses 2+ b/l Skin: Warm, dry, no rashes/lesions Neuro: No focal deficits Psych: Calm, cooperative, appropriate affect   Condition at discharge: good  The results of significant diagnostics from this hospitalization (including imaging, microbiology, ancillary and laboratory) are listed below for reference.   Imaging Studies: DG Thoracic Spine 2 View Result Date: 12/13/2024 EXAM: 3 INTRAOPERATIVE FLUOROSCOPIC VIEWS XRAY OF THE THORACIC SPINE 12/13/2024 04:15:37 PM COMPARISON: None available. CLINICAL HISTORY: Elective surgery [id number]. FINDINGS: BONES: Vertebral body heights are maintained. Alignment is normal. Bilateral pedicle screws are seen at 4 levels. Please see operative report for further description. DISCS AND DEGENERATIVE CHANGES: No severe degenerative changes. Ligament is grossly anatomic. SOFT TISSUES: The visualized lungs are clear. IMPRESSION: 1. Bilateral pedicle  screws at 4 thoracic levels, as above. 2. Thoracic spinal alignment is grossly anatomic. Electronically signed by: Greig Pique MD 12/13/2024 08:03 PM EST RP Workstation: [id number]   DG C-Arm 1-60 Min-No Report Result Date: 12/13/2024 Fluoroscopy was utilized by the requesting physician.  No radiographic interpretation.   DG C-Arm 1-60 Min-No Report Result Date: 12/13/2024 Fluoroscopy was utilized by the requesting physician.  No radiographic interpretation.   DG C-Arm 1-60 Min-No Report Result Date: 12/13/2024 Fluoroscopy was utilized by the requesting physician.  No radiographic interpretation.   MR THORACIC SPINE WO CONTRAST Addendum Date: 12/11/2024 ** ADDENDUM #1 ** ADDENDUM: Findings discussed with Luke Pean, NP at 5:03PM on 12/11/24. ----------------------------------------------------  Electronically signed by: Donnice Mania MD 12/11/2024 05:20 PM EST RP Workstation: [id number]   Result Date: 12/11/2024 ** ORIGINAL REPORT ** EXAM: MRI THORACIC SPINE WITHOUT INTRAVENOUS CONTRAST 12/11/2024 02:46:58 PM TECHNIQUE: Multiplanar multisequence MRI of the thoracic spine was performed without the administration of intravenous contrast. COMPARISON: CT thoracic spine 12/09/2024. CLINICAL HISTORY: Spine fracture, thoracic, traumatic. FINDINGS: BONES AND ALIGNMENT: Redemonstrated transversely oriented fracture through the T9 vertebral body traversing the anterior and posterior cortices and extending into the bilateral pedicles with evidence of hyperextension at this level corresponding to findings on the CT. There is associated bone marrow edema along the fracture. Additional nondisplaced fracture at the T8 vertebral body with a transverse component involving the anterior and posterior cortices with additional oblique component extending into the inferior endplate. No definite pedicle involvement. There is additional mild bone marrow edema along the T7 inferior endplate concerning for additional nondisplaced fracture. There is vertebral body height loss noted anteriorly at T9. Otherwise, vertebral body heights are maintained. No significant retropulsion. 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 Possible edema along the posterior longitudinal ligament at T7. Mild focal edema in the ligamentum flavum at T7-T8. Bone marrow signal is otherwise unremarkable. No abnormal enhancement. SPINAL CORD: There is a central disc protrusion at T7-T8 which indents the ventral thoracic cord. There is associated cord compression and cord signal abnormality concerning for edema. There is susceptibility along the left ventral aspect of the thoracic cord at the T7 level seen on series 11 image 32 and series 12 images 3 and 4 concerning for focal traumatic cord hemorrhage. The conus medullaris extends to the L1  level. Prominent dorsal epidural fat throughout the thoracic spine particularly from T3 through T10. There is associated long segment narrowing of the thecal sac and associated CSF flow artifact which slightly limits the T2 images. SOFT TISSUES: Associated prevertebral edema extending from T6 to T11. There is edema throughout the paraspinal musculature in the mid and lower thoracic spine. Bilateral pleural effusions and associated atelectasis. DEGENERATIVE CHANGES: Facet arthrosis at multiple levels in the thoracic spine. No high grade foraminal stenosis. No significant disc herniation other than the T7-T8 central disc protrusion described above. No spinal canal stenosis other than the long segment narrowing of the thecal sac described above. IMPRESSION: 1. Central disc protrusion at T7-T8 with cord compression and cord signal abnormality concerning for edema. Susceptibility at the left ventral cord at T7 concerning for focal traumatic cord hemorrhage. 2. Transversely oriented T9 vertebral body fracture with associated bone marrow edema compatible with hyperextension injury. Additional nondisplaced T8 vertebral body fracture and probable additional nondisplaced fracture at the T7 inferior endplate. 3. No significant retropulsion. 4. 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 Possible posterior longitudinal ligament edema at T7 and mild focal  edema in the ligamentum flavum at T7-T8. 5. Prominent dorsal epidural fat from T3 to T10 with long-segment thecal sac narrowing which slightly limits evaluation. Electronically signed by: Donnice Mania MD 12/11/2024 04:31 PM EST RP Workstation: [id number]   DG Wrist 2 Views Left Result Date: 12/11/2024 EXAM: 2 VIEW(S) XRAY OF THE LEFT WRIST 12/11/2024 10:46:44 AM COMPARISON: 09/29/2023 CLINICAL HISTORY: [id number] Fall [id number] FINDINGS: BONES AND JOINTS: No acute fracture. No malalignment. Well corticated ossific  density adjacent to the trapezium likely sequela of prior trauma. Degenerative changes of the first carpometacarpal joint and wrist. Mild radiocarpal arthritis. SOFT TISSUES: The soft tissues are unremarkable. Vascular calcifications. IMPRESSION: 1. No acute fracture or dislocation. 2. Well-corticated ossific density adjacent to the trapezium, likely sequela of prior trauma. Electronically signed by: Norleen Boxer MD 12/11/2024 02:42 PM EST RP Workstation: [id number]   DG Shoulder Left Result Date: 12/11/2024 CLINICAL DATA:  Left shoulder pain following a fall. EXAM: LEFT SHOULDER - 2+ VIEW COMPARISON:  Chest radiograph dated 02/09/2023 FINDINGS: Unremarkable left shoulder without fracture or dislocation. Old, healed left rib fracture. No acute fracture. IMPRESSION: No acute abnormality. Electronically Signed   By: Elspeth Bathe M.D.   On: 12/11/2024 14:36   DG Elbow 2 Views Left Result Date: 12/11/2024 CLINICAL DATA:  Status post fall EXAM: LEFT ELBOW - 2 VIEW COMPARISON:  None Available. FINDINGS: There is no evidence of fracture, dislocation, or joint effusion. Degenerative changes of the elbow. Bony spur at the triceps tendon insertion site. Soft tissues are unremarkable. IMPRESSION: 1. No acute fracture or dislocation. 2. Degenerative changes of the elbow. Electronically Signed   By: Limin  Xu M.D.   On: 12/11/2024 14:34   CT Thoracic Spine Wo Contrast Result Date: 12/09/2024 EXAM: CT THORACIC SPINE WITHOUT CONTRAST 12/09/2024 10:59:46 PM TECHNIQUE: CT of the thoracic spine was performed without the administration of intravenous contrast. Multiplanar reformatted images are provided for review. Automated exposure control, iterative reconstruction, and/or weight based adjustment of the mA/kV was utilized to reduce the radiation dose to as low as reasonably achievable. COMPARISON: Thoracic spine MRI 01/20/2023 CLINICAL HISTORY: Back trauma, no prior imaging (Age >= 16y) FINDINGS: BONES AND ALIGNMENT:  Normal vertebral body heights except at T9. At T9, there is an acute fracture that traverses the anterior and posterior walls and extends into both pedicles. There is hyperextension at this level. The fracture pattern is consistent with an AO Spine B3 hyperextension type injury. There is almost certainly a tear of the anterior longitudinal ligament, which could be confirmed with MRI. Normal alignment otherwise. DEGENERATIVE CHANGES: There are flowing anterior osteophytes throughout the thoracic spine. SOFT TISSUES: No acute abnormality. IMPRESSION: 1. Acute T9 fracture with hyperextension, consistent with an AOSpine B3 injury, with probable anterior longitudinal ligament tear; recommend MRI to confirm the ligament injury and assess the spinal cord. 2. Discussed with Dr. Cleotilde at 11:34 pm on 12/09/2024 . Electronically signed by: Franky Stanford MD 12/09/2024 11:34 PM EST RP Workstation: [id number]   CT Lumbar Spine Wo Contrast Result Date: 12/09/2024 EXAM: CT OF THE LUMBAR SPINE WITHOUT CONTRAST 12/09/2024 10:59:46 PM TECHNIQUE: CT of the lumbar spine was performed without the administration of intravenous contrast. Multiplanar reformatted images are provided for review. Automated exposure control, iterative reconstruction, and/or weight based adjustment of the mA/kV was utilized to reduce the radiation dose to as low as reasonably achievable. COMPARISON: None available. CLINICAL HISTORY: Back trauma, no prior imaging (Age >= 16y). FINDINGS: BONES AND ALIGNMENT: Normal vertebral body heights. No acute fracture or  suspicious bone lesion. Normal alignment. DEGENERATIVE CHANGES: L3-L5 PLIF. Bulky ossific material of the dorsal spinal canal at the L2-L3 level causing severe stenosis. There is mild spinal canal stenosis at the L3-L4 level. Otherwise, the spinal canal is widely patent. Moderate bilateral L2 neural foraminal stenosis. Mild lucency along the right L3 transpedicular screw, which may indicate loosening.  SOFT TISSUES: Calcific aortic atherosclerosis. Nonobstructing interpolar calculus of the right kidney measuring 4 mm. No acute abnormality. IMPRESSION: 1. 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 2. Mild lucency along the right L3 transpedicular screw, which may indicate loosening. Electronically signed by: Franky Stanford MD 12/09/2024 11:24 PM EST RP Workstation: [id number]   CT Cervical Spine Wo Contrast Result Date: 12/09/2024 EXAM: CT CERVICAL SPINE WITHOUT CONTRAST 12/09/2024 10:59:46 PM TECHNIQUE: CT of the cervical spine was performed without the administration of intravenous contrast. Multiplanar reformatted images are provided for review. Automated exposure control, iterative reconstruction, and/or weight based adjustment of the mA/kV was utilized to reduce the radiation dose to as low as reasonably achievable. COMPARISON: None available. CLINICAL HISTORY: Neck trauma (Age >= 65y) FINDINGS: BONES AND ALIGNMENT: C3-C4 ACDF. No acute fracture or traumatic malalignment. DEGENERATIVE CHANGES: Multilevel degenerative disc disease with no high-grade spinal canal stenosis. SOFT TISSUES: No prevertebral soft tissue swelling. IMPRESSION: 1. No acute findings. 2. C3-C4 ACDF. 3. Multilevel degenerative disc disease without high-grade spinal canal stenosis. Electronically signed by: Franky Stanford MD 12/09/2024 11:15 PM EST RP Workstation: [id number]   CT Head Wo Contrast Result Date: 12/09/2024 EXAM: CT HEAD WITHOUT 12/09/2024 10:59:46 PM TECHNIQUE: CT of the head was performed without the administration of intravenous contrast. Automated exposure control, iterative reconstruction, and/or weight based adjustment of the mA/kV was utilized to reduce the radiation dose to as low as reasonably achievable. COMPARISON: 02/23/2023 CLINICAL HISTORY: Head trauma, minor (Age >= 65y); Trauma after fall FINDINGS: BRAIN AND VENTRICLES: No  acute intracranial hemorrhage. No mass effect or midline shift. No extra-axial fluid collection. 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 No hydrocephalus. Mild cerebral atrophy. Patchy white matter hypodensities compatible with moderate chronic small vessel ischemic disease. Calcified atherosclerosis at skull base. ORBITS: Bilateral cataract extraction. SINUSES AND MASTOIDS: No acute abnormality. SOFT TISSUES AND SKULL: No acute skull fracture. No acute soft tissue abnormality. IMPRESSION: 1. No acute intracranial abnormality. 2. Mild cerebral atrophy, moderate chronic small vessel ischemic disease, and a small chronic lacunar infarct in the right thalamus, unchanged from prior. Electronically signed by: Franky Stanford MD 12/09/2024 11:03 PM EST RP Workstation: [id number]    Microbiology: Results for orders placed or performed during the hospital encounter of 12/09/24  Surgical pcr screen     Status: None   Collection Time: 12/13/24 11:14 AM   Specimen: Nasal Mucosa; Nasal Swab  Result Value Ref Range Status   MRSA, PCR NEGATIVE NEGATIVE Final   Staphylococcus aureus NEGATIVE NEGATIVE Final    Comment: (NOTE) The Xpert SA Assay (FDA approved for NASAL specimens in patients 48 years of age and older), is one component of a comprehensive surveillance program. It is not intended to diagnose infection nor to guide or monitor treatment. Performed at [facility], [address] [place], [place] [address]     Labs: CBC: Recent Labs  Lab 12/13/24 0442 12/13/24 1620 12/14/24 0238 12/16/24 0630 12/18/24 0421 12/19/24 0411  WBC 10.4  --  10.2 10.6* 9.9 9.1  NEUTROABS  --   --   --   --  7.3 6.3  HGB 11.2* 8.8* 10.8* 11.0* 10.9* 11.0*  HCT 34.3* 26.0* 33.0* 33.3* 32.7* 33.6*  MCV 91.0  --  88.7 89.3 88.6 89.4  PLT 214  --  184 227 278 298   Basic Metabolic Panel: Recent Labs  Lab 12/13/24 0442 12/13/24 1620 12/14/24 0238 12/16/24 0630 12/18/24 0421 12/19/24 0411  NA  139 139 139 139 139 137  K 3.8 3.8 4.0 3.8 4.0 3.7  CL 105  --  105 105 102 101  CO2 26  --  26 26 30 29   GLUCOSE 103*  --  131* 101* 93 97  BUN 24*  --  26* 29* 28* 24*  CREATININE 1.75*  --  1.74* 1.75* 1.66* 1.59*  CALCIUM 8.5*  --  7.8* 8.4* 8.4* 8.4*   Liver Function Tests: Recent Labs  Lab 12/13/24 0442 12/14/24 0238 12/16/24 0630  AST 14* 24 21  ALT 7 8 10   ALKPHOS 79 63 73  BILITOT 0.4 0.3 0.3  PROT 5.6* 5.1* 5.5*  ALBUMIN  2.8* 2.9* 2.8*   CBG: No results for input(s): GLUCAP in the last 168 hours.  Discharge time spent: Time Coordinating Discharge: I spent a total of 35 minutes engaged in face-to-face discussion with the patient and/or caregivers regarding the patients care, assessment, plan, and discharge disposition. Over 50% of this time was dedicated to counseling the patient on the risks and benefits of treatment options and the discharge plan, as well as coordinating post-discharge care.   Signed: Seleste Tallman Al-Sultani, MD [facility] 12/19/2024         "

## 2024-12-19 NOTE — Progress Notes (Signed)
 Patient ID: Zachary [name], male   DOB: 07-24-1946, 78 y.o.   MRN: [id number] I examined the incision myself today.  It is dry today without any evidence of drainage.  I have advised that the incision can be painted with Betadine  swab stick and covered with gauze dressing changes should be done daily if it remains completely dry then this can be discontinued after about 3 days.  He is okay for discharge

## 2024-12-19 NOTE — TOC Transition Note (Signed)
 Transition of Care [facility]) - Discharge Note   Patient Details  Name: Zachary [name] MRN: [id number] Date of Birth: 1946-06-02  Transition of Care [facility]) CM/SW Contact:  Lauraine FORBES Saa, LCSWA Phone Number: 12/19/2024, 12:22 PM   Clinical Narrative:     Patient will DC to: [facility] Anticipated DC date: 12/19/24 Family notified: Keil Pickering; [phone] Transport by: [facility]   Per MD patient ready for DC to [facility] SNF. RN to call report prior to discharge [phone]). RN, patient, patient's family, and facility notified of DC. Discharge Summary and FL2 sent to facility. DC packet on chart. Ambulance transport requested for patient at 12:12  CSW will sign off for now as social work intervention is no longer needed. Please consult us  again if new needs arise.    Final next level of care: Skilled Nursing Facility Barriers to Discharge: Barriers Resolved   Patient Goals and CMS Choice Patient states their goals for this hospitalization and ongoing recovery are:: get back the way I was   Choice offered to / list presented to : Patient (requesting [facility] or [facility])      Discharge Placement              Patient chooses bed at: [facility] Patient to be transferred to facility by: [facility] Name of family member notified: Braxtyn Bojarski; Spouse; [phone] Patient and family notified of of transfer: 12/19/24  Discharge Plan and Services Additional resources added to the After Visit Summary for   In-house Referral: Clinical Social Work   Post Acute Care Choice: Skilled Nursing Facility                               Social Drivers of Health (SDOH) Interventions SDOH Screenings   Food Insecurity: No Food Insecurity (12/10/2024)  Housing: Low Risk (12/10/2024)  Transportation Needs: No Transportation Needs (12/10/2024)  Utilities: Not At Risk (12/10/2024)  Depression (PHQ2-9): High Risk (07/26/2023)  Social  Connections: Socially Integrated (12/10/2024)  Tobacco Use: Medium Risk (12/13/2024)     Readmission Risk Interventions    12/10/2024    9:44 AM 02/11/2023   12:02 PM  Readmission Risk Prevention Plan  Medication Screening Complete   Transportation Screening Complete Complete  PCP or Specialist Appt within 5-7 Days  Complete  Home Care Screening  Complete  Medication Review (RN CM)  Complete

## 2024-12-20 ENCOUNTER — Encounter: Payer: Self-pay | Admitting: Adult Health

## 2024-12-20 ENCOUNTER — Non-Acute Institutional Stay (SKILLED_NURSING_FACILITY): Payer: Self-pay | Admitting: Adult Health

## 2024-12-20 DIAGNOSIS — R4189 Other symptoms and signs involving cognitive functions and awareness: Secondary | ICD-10-CM | POA: Diagnosis not present

## 2024-12-20 DIAGNOSIS — G4733 Obstructive sleep apnea (adult) (pediatric): Secondary | ICD-10-CM | POA: Diagnosis not present

## 2024-12-20 DIAGNOSIS — D62 Acute posthemorrhagic anemia: Secondary | ICD-10-CM | POA: Insufficient documentation

## 2024-12-20 DIAGNOSIS — H40053 Ocular hypertension, bilateral: Secondary | ICD-10-CM | POA: Diagnosis not present

## 2024-12-20 DIAGNOSIS — F418 Other specified anxiety disorders: Secondary | ICD-10-CM | POA: Insufficient documentation

## 2024-12-20 DIAGNOSIS — S22078D Other fracture of T9-T10 vertebra, subsequent encounter for fracture with routine healing: Secondary | ICD-10-CM | POA: Diagnosis not present

## 2024-12-20 DIAGNOSIS — E46 Unspecified protein-calorie malnutrition: Secondary | ICD-10-CM | POA: Insufficient documentation

## 2024-12-20 DIAGNOSIS — I10 Essential (primary) hypertension: Secondary | ICD-10-CM

## 2024-12-20 DIAGNOSIS — R29818 Other symptoms and signs involving the nervous system: Secondary | ICD-10-CM | POA: Insufficient documentation

## 2024-12-20 DIAGNOSIS — E782 Mixed hyperlipidemia: Secondary | ICD-10-CM

## 2024-12-20 DIAGNOSIS — E43 Unspecified severe protein-calorie malnutrition: Secondary | ICD-10-CM | POA: Diagnosis not present

## 2024-12-20 DIAGNOSIS — N1832 Chronic kidney disease, stage 3b: Secondary | ICD-10-CM

## 2024-12-20 NOTE — Progress Notes (Signed)
 " Location:  [facility] Room Number: 160 P Place of Service:  SNF (31)   CODE STATUS: DNR  Allergies[1]  Chief Complaint  Patient presents with   Hospitalization Follow-up    Follow-up from recent hospital stay     HPI:  He is a 78 year old man who has been hospitalized from 12-09-24 through 12-19-24. His past medical history includes hypertension; ckd stage 3b; bladder cancer status post several bladder surgeries and turbt (01/24) low-grade papillary urethral carcinoma; anxiety/depression; prediabetes; urinary retention requiring intermittent catheterizations. He presented to the ED after suffering a fall. He fell after exiting his vehicle while walking up to his residence where he fell on his back and hit his head. He denies any neurological deficits. CT of head cervical spine, and lumbar spine did not demonstrate acute findings. CT Of lumbar spine noted hypertrophic bone causing severe spinal stenosis at L2-3. CT thoracic spine demonstrated an acute T9 fracture with hyperextension consistent with AOSpine B3 injury with probable anterior longitudinal ligament tear.  MRI T-spine showed traumatic hyperextension injury with T9 vertebral body fracture, additional nondisplaced T8 and probable T7 fractures, anterior longitudinal ligament disruption at T8-9, and a T7-T8 central disc protrusion causing thoracic and cord compression with cord edema and possible focal cord damage, without significant retropulsion.  He was seen by neurosurgery and underwent a posterior decompression and fusion from T7-T11 on 12/13/2024. He is here for short term rehab and will return back to his residence. He will continue to be followed for his chronic illnesses including: Mixed hyperlipidemia:  Essential hypertension benign: Obstructive sleep apnea  Past Medical History:  Diagnosis Date   Anxiety    takes Valium  as needed   Arthritis    Bladder cancer (HCC)    takes Rapaflo daily   Blood  dyscrasia    07/08/16: pt told he was a free bleeder after bleeding a lot when derm cut off mole on forehead. No excessive bleeding with minor wounds at home, no bleeding problems perioperatively with prior surgeries.   Chronic back pain    spondylolisthesis   Cluster headaches    Depression    takes Lexapro  daily   Essential hypertension, benign    takes Diovan -HCT daily   Hearing loss    hearing aids   History of kidney stones    Hx of complications due to general anesthesia    Aborted surgery 07/15/2016 due to hypotension per pt   Joint pain    Kidney stone 03/2019   passed stone   Obstructive sleep apnea on CPAP    uses CPAP @ night   Parkinsonism (HCC)    Pneumonia    several times--last time about 3-4 yrs ago   Pre-diabetes    Prediabetes    Syncopal episodes    Thyroid  condition     Past Surgical History:  Procedure Laterality Date   ANTERIOR CERVICAL DECOMP/DISCECTOMY FUSION N/A 06/05/2013   Procedure:  C3-4 Anterior Cervical Discectomy and Fusion, Allograft, Plate;  Surgeon: Oneil JAYSON Herald, MD;  Location: [facility] OR;  Service: Orthopedics;  Laterality: N/A;  C3-4 Anterior Cervical Discectomy and Fusion, Allograft, Plate   Arthroscopic knee surgery     BACK SURGERY      x 2   BLADDER SURGERY     x 5 to remove tumor   CARPAL TUNNEL RELEASE     CATARACT EXTRACTION W/PHACO  12/19/2012   Procedure: CATARACT EXTRACTION PHACO AND INTRAOCULAR LENS PLACEMENT (IOC);  Surgeon: Cherene Mania, MD;  Location: [facility] ORS;  Service: Ophthalmology;  Laterality: Left;  CDE: 26.80   CATARACT EXTRACTION W/PHACO  01/09/2013   Procedure: CATARACT EXTRACTION PHACO AND INTRAOCULAR LENS PLACEMENT (IOC);  Surgeon: Cherene Mania, MD;  Location: [facility] ORS;  Service: Ophthalmology;  Laterality: Right;  CDE:22.83   CERVICAL FUSION  06/05/2013   C 3  C4   CYSTOSCOPY     CYSTOSCOPY W/ URETERAL STENT PLACEMENT Bilateral 02/12/2020   Procedure: CYSTOSCOPY WITH RETROGRADE PYELOGRAM/URETERAL STENT PLACEMENT;  Surgeon:  Watt Rush, MD;  Location: [facility] ORS;  Service: Urology;  Laterality: Bilateral;   INTRAMEDULLARY (IM) NAIL INTERTROCHANTERIC Left 02/10/2023   Procedure: LEFT INTRAMEDULLARY (IM) NAILING OF LEFT FEMUR;  Surgeon: Kendal Franky SQUIBB, MD;  Location: [facility] OR;  Service: Orthopedics;  Laterality: Left;   LAMINECTOMY WITH POSTERIOR LATERAL ARTHRODESIS LEVEL 4 N/A 12/13/2024   Procedure: Thoracic Seven-Thoracic Eight - Thoracic Eight-Thoracic Nine - Thoracic Nine-Thoracic Ten, Thoracic Ten-Thoracic Eleven pedicle screw fixation and posterolateral arthrodesis decompression of Thoracic Eight;  Surgeon: Onetha Kuba, MD;  Location: [facility] OR;  Service: Neurosurgery;  Laterality: N/A;  T7-T8 - T8-T9 - T9-T10, T10-11 pedicle screw fixation and posterolateral arthrodesis  decompress   LITHOTRIPSY  02/2020   REFRACTIVE SURGERY     Hx: of   RETINAL DETACHMENT SURGERY     TOTAL KNEE ARTHROPLASTY Left 02/01/2018   Procedure: TOTAL KNEE ARTHROPLASTY;  Surgeon: Margrette Taft BRAVO, MD;  Location: [facility];  Service: Orthopedics;  Laterality: Left;   TRANSURETHRAL RESECTION OF BLADDER TUMOR Right 07/27/2015   Procedure: TRANSURETHRAL RESECTION OF BLADDER TUMOR (TURBT);  Surgeon: Arlena Gal, MD;  Location: [facility];  Service: Urology;  Laterality: Right;   TRANSURETHRAL RESECTION OF BLADDER TUMOR WITH MITOMYCIN -C N/A 01/25/2023   Procedure: TRANSURETHRAL RESECTION OF BLADDER TUMOR WITH GEMCITABINE ;  Surgeon: Carolee Sherwood JONETTA DOUGLAS, MD;  Location: [facility]S;  Service: Urology;  Laterality: N/A;   wisdom tooth extraction      Social History   Socioeconomic History   Marital status: Married    Spouse name: Pat    Number of children: 0   Years of education: 13   Highest education level: Not on file  Occupational History   Occupation: Retired    Comment: med [employer]    Employer: UNEMPLOYED  Tobacco Use   Smoking status: Former    Current packs/day: 0.00    Average packs/day: 0.3 packs/day for 1 year (0.3 ttl  pk-yrs)    Types: Cigarettes, Cigars    Start date: 12/31/1975    Quit date: 12/30/1976    Years since quitting: 48.0    Passive exposure: Past   Smokeless tobacco: Never   Tobacco comments:    1985  Vaping Use   Vaping status: Never Used  Substance and Sexual Activity   Alcohol  use: No    Comment: quit 1990   Drug use: No   Sexual activity: Yes  Other Topics Concern   Not on file  Social History Narrative   Lives with wife    caffeine- sodas   Right handed   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    Social Drivers of Health   Tobacco Use: Medium Risk (12/20/2024)   Patient History    Smoking Tobacco Use: Former    Smokeless Tobacco Use: Never    Passive Exposure: Past  Physicist, Medical Strain: Not on file  Food Insecurity: No Food Insecurity (12/10/2024)   Epic    Worried About Radiation Protection Practitioner of Food in the Last  Year: Never true    Ran Out of Food in the Last Year: Never true  Transportation Needs: No Transportation Needs (12/10/2024)   Epic    Lack of Transportation (Medical): No    Lack of Transportation (Non-Medical): No  Physical Activity: Not on file  Stress: Not on file  Social Connections: Socially Integrated (12/10/2024)   Social Connection and Isolation Panel    Frequency of Communication with Friends and Family: More than three times a week    Frequency of Social Gatherings with Friends and Family: Once a week    Attends Religious Services: More than 4 times per year    Active Member of [employer] or Organizations: Yes    Attends Banker Meetings: More than 4 times per year    Marital Status: Married  Catering Manager Violence: Not At Risk (12/10/2024)   Epic    Fear of Current or Ex-Partner: No    Emotionally Abused: No    Physically Abused: No    Sexually Abused: No  Depression (PHQ2-9): High Risk (07/26/2023)   Depression (PHQ2-9)    PHQ-2 Score: 18  Alcohol  Screen: Not on file  Housing: Low Risk (12/10/2024)   Epic    Unable to Pay for  Housing in the Last Year: No    Number of Times Moved in the Last Year: 0    Homeless in the Last Year: No  Utilities: Not At Risk (12/10/2024)   Epic    Threatened with loss of utilities: No  Health Literacy: Not on file   Family History  Problem Relation Age of Onset   Hypertension Father    Cancer Father        Prostate   Depression Mother    Hypertension Mother    Alcohol  abuse Mother    COPD Mother        passed 07-08-17   Hypertension Sister    Anxiety disorder Sister    Alcohol  abuse Maternal Grandfather    ADD / ADHD Neg Hx    Bipolar disorder Neg Hx    Dementia Neg Hx    Drug abuse Neg Hx    OCD Neg Hx    Paranoid behavior Neg Hx    Schizophrenia Neg Hx    Seizures Neg Hx    Sexual abuse Neg Hx    Physical abuse Neg Hx       VITAL SIGNS BP (!) 158/82 Comment: collected by facility staff  Pulse 84   Ht 6' 1.5 (1.867 m)   Wt 201 lb (91.2 kg)   SpO2 97%   BMI 26.16 kg/m   Outpatient Encounter Medications as of 12/20/2024  Medication Sig   acetaminophen  (TYLENOL ) 500 MG tablet Take 500 mg by mouth every 8 (eight) hours as needed (pain level 1-3/ headache/fever).   carvedilol  (COREG ) 6.25 MG tablet Take 1 tablet (6.25 mg total) by mouth 2 (two) times daily.   dorzolamide-timolol (COSOPT) 2-0.5 % ophthalmic solution Place 1 drop into both eyes 2 (two) times daily.   DULoxetine  (CYMBALTA ) 60 MG capsule Take 1 capsule (60 mg total) by mouth 2 (two) times daily.   latanoprost (XALATAN) 0.005 % ophthalmic solution Place 1 drop into both eyes at bedtime.   methocarbamol  (ROBAXIN ) 500 MG tablet Take 1 tablet (500 mg total) by mouth 3 (three) times daily.   Multiple Vitamin (MULTIVITAMIN) tablet Take 1 tablet by mouth daily.   Multiple Vitamins-Minerals (PRESERVISION AREDS PO) Take 1 capsule by mouth in the morning and at  bedtime.   Omega-3 Fatty Acids (FISH OIL) 1000 MG CAPS Take 1 capsule by mouth daily.   oxyCODONE  10 MG TABS Take 1 tablet (10 mg total) by mouth  every 6 (six) hours as needed.   pravastatin  (PRAVACHOL ) 10 MG tablet Take 10 mg by mouth at bedtime.   valsartan  (DIOVAN ) 320 MG tablet Take 320 mg by mouth daily.   [DISCONTINUED] acetaminophen  (TYLENOL ) 500 MG tablet Take 2 tablets (1,000 mg total) by mouth every 6 (six) hours as needed for mild pain (pain score 1-3), fever or headache.   [DISCONTINUED] multivitamin-lutein (OCUVITE-LUTEIN) CAPS capsule Take 1 capsule by mouth daily. (Patient not taking: Reported on 12/20/2024)   No facility-administered encounter medications on file as of 12/20/2024.     SIGNIFICANT DIAGNOSTIC EXAMS  LABS  12-09-24: wbc 12.4; hgb 13.5; hct 41.5; mcv 91.8 plt 253; glucose 122; bun 28; creat 2.04; k+ 3.6; na++ 412; ca 9.1; gfr 33; protein 7.0 albumin  4.0 12-13-24: wbc 10.4; hgb 11.2; hct 34.3; mcv 91.0 plt 214; glucose 103; bun 24; creat 1.75; k+ 3.8; an++ 139; ca 8.5 gfr 39; protein 6.5 albumin  2.8 12-19-24: wbc 9.1; hgb 11.0; hct 33.6; mcv 89.4 plt 298; glucose 97; bun 27; creat 1.59; k+ 3.7; na++ 137; ca 8.4 gfr 44;    Review of Systems  Constitutional:  Positive for malaise/fatigue.  Respiratory:  Negative for cough and shortness of breath.   Cardiovascular:  Negative for chest pain, palpitations and leg swelling.  Gastrointestinal:  Negative for abdominal pain, constipation and heartburn.  Musculoskeletal:  Negative for back pain, joint pain and myalgias.  Skin: Negative.   Neurological:  Negative for dizziness.  Psychiatric/Behavioral:  The patient is not nervous/anxious.     Physical Exam Constitutional:      General: He is not in acute distress.    Appearance: He is well-developed. He is not diaphoretic.  Neck:     Thyroid : No thyromegaly.  Cardiovascular:     Rate and Rhythm: Normal rate and regular rhythm.     Heart sounds: Normal heart sounds.  Pulmonary:     Effort: Pulmonary effort is normal. No respiratory distress.     Breath sounds: Normal breath sounds.  Abdominal:      General: Bowel sounds are normal. There is no distension.     Palpations: Abdomen is soft.     Tenderness: There is no abdominal tenderness.  Genitourinary:    Comments: foley Musculoskeletal:        General: Normal range of motion.     Cervical back: Neck supple.     Right lower leg: No edema.     Left lower leg: No edema.     Comments: Back brace in place  Is s/p T7-11: posterior decompression and fusion on 12-13-24.   Lymphadenopathy:     Cervical: No cervical adenopathy.  Skin:    General: Skin is warm and dry.  Neurological:     Mental Status: He is alert. Mental status is at baseline.  Psychiatric:        Mood and Affect: Mood normal.      ASSESSMENT/ PLAN:  TODAY  Other closed fracture of ninth thoracic vertebra with routine healing subsequent encounter: will continue therapy as directed to improve upon his level of independence with his adls. Will continue follow up with neurosurgery. Will continue with back brace. Is taking robaxin  500 mg three times daily and will continue oxycodone  10 mg every 6 hours as needed will monitor  2.  Chronic kidney disease stage 3b: bun 24; creat 1.59 gfr 44  3. ABLA (acute blood loss anemia) hgb 11.0 will monitor  4. Mixed hyperlipidemia: will continue pravachol  10 mg daily   5. Essential hypertension benign: b/p 158/82: will continue valsartan  320 mg daily has recently had coreg  increased to 6.25 mg twice daily may need further adjustment in the future  6. Obstructive sleep apnea: uses cpap  7. Depression with anxiety: will continue cymbalta  60 mg twice daily   8. Increased intraocular pressure bilateral: will continue cosopt to both eyes twice daily and xalatan to both eyes nightly   9. Severe protein calorie malnutrition: albumin  2.8 will begin prosource 3 times daily   10. Neurocognitive deficits: will monitor    Barnie Seip NP [facility]  call [phone]      [1]  Allergies Allergen Reactions    Poison Oak Extract Hives and Itching   Sulfa Antibiotics Other (See Comments)    Unknown    Sulfonamide Derivatives Itching and Rash   "

## 2024-12-22 ENCOUNTER — Encounter: Payer: Self-pay | Admitting: Internal Medicine

## 2024-12-22 ENCOUNTER — Non-Acute Institutional Stay (SKILLED_NURSING_FACILITY): Payer: Self-pay | Admitting: Internal Medicine

## 2024-12-22 DIAGNOSIS — R29818 Other symptoms and signs involving the nervous system: Secondary | ICD-10-CM

## 2024-12-22 DIAGNOSIS — S22078D Other fracture of T9-T10 vertebra, subsequent encounter for fracture with routine healing: Secondary | ICD-10-CM | POA: Diagnosis not present

## 2024-12-22 DIAGNOSIS — R4189 Other symptoms and signs involving cognitive functions and awareness: Secondary | ICD-10-CM | POA: Diagnosis not present

## 2024-12-22 DIAGNOSIS — G4733 Obstructive sleep apnea (adult) (pediatric): Secondary | ICD-10-CM

## 2024-12-22 DIAGNOSIS — E440 Moderate protein-calorie malnutrition: Secondary | ICD-10-CM | POA: Diagnosis not present

## 2024-12-22 DIAGNOSIS — N1831 Chronic kidney disease, stage 3a: Secondary | ICD-10-CM

## 2024-12-22 NOTE — Assessment & Plan Note (Signed)
 Close monitor needed because of increased risk of respiratory suppression with opioids.

## 2024-12-22 NOTE — Assessment & Plan Note (Signed)
 Current albumin  2.8 & total protein 5.5.  Nutritionist will assess; protein supplementation ordered.

## 2024-12-22 NOTE — Assessment & Plan Note (Addendum)
 Current creatinine 1.59 with GFR of 44, BUN 36 indicating CKD high stage III b.  Med list reviewed; no indication for change of meds or dosage at this time but continue to monitor.

## 2024-12-22 NOTE — Assessment & Plan Note (Signed)
PT/OT at SNF as tolerated. ?

## 2024-12-22 NOTE — Assessment & Plan Note (Signed)
 Staff reports confusion.  MMSE will be performed at the SNF.

## 2024-12-22 NOTE — Progress Notes (Signed)
 "   NURSING HOME LOCATION:  [facility] ROOM NUMBER:  160 P  CODE STATUS:  Full Code  PCP:  Norleen PEDLAR. Shona MD  This is a comprehensive admission note to this SNFperformed on this date less than 30 days from date of admission. Included are preadmission medical/surgical history; reconciled medication list; family history; social history and comprehensive review of systems.  Corrections and additions to the records were documented. Comprehensive physical exam was also performed. Additionally a clinical summary was entered for each active diagnosis pertinent to this admission in the Problem List to enhance continuity of care.  HPI: He was hospitalized 12/13 - 12/19/2024 after a mechanical fall resulting in acute T9 fracture with hyperextension consistent with AO spine B3 injury with probable anterior longitudinal ligament tear.  MRI of the thoracic spine revealed traumatic hyperextension injury with T9 vertebral body fracture and nondisplaced T8 and probable T7 fractures as well as anterior longitudinal ligament disruption of T8-9 and a T7-T8 central disc protrusion causing thoracic and cord compression with cord edema and possible focal cord damage without significant retropulsion.  Neurosurgery consulted and patient underwent pedicle screw fixation T7-T8,T8-T9,T9-T10, &T10-11 and posterolateral arthrodesis decompression of T8 on 12/17 by Dr Onetha. Initial creatinine was 2.04 with a GFR of 33 and BUN of 28.  Final values revealed a creatinine of 1.59 and GFR of 44 with a BUN of 36.  This would be compatible with high stage IIIb CKD.  White count was elevated at 12,400.  Initial H/H was 13.5/41.5; nadir values were 8.8/26 and final values 11/33.6.  While hospitalized glucoses ranged from a low of 93 up to a high of 131.  Last recorded A1c on record was 6.3% on 12/01/2022.   PT/OT consulted and recommended SNF placement for rehab.  Past medical and surgical history: Includes history of  bladder cancer; chronic pain syndrome; history of cluster headaches; essential hypertension; history of nephrolithiasis; Parkinson's; history of prediabetes; and OSA. Surgeries and procedures include anterior cervical decompression/discectomy fusion in 2014; arthroscopic knee surgery; bladder tumor resection; cystoscopy with ureteral stent placement; and carpal tunnel release.  Family history: reviewed, non contributory due to advanced age.  Social history: Nondrinker; former smoker.   Review of systems: Although the recorded history was that this was a mechanical fall; he stated that he lost his balance because he was lightheaded. He also validated some double vision as well astingling in the back prior to the fall.  He apparently was walking up a ramp and fell after taking 2 steps. He validates occasional dysphagia.  He did seem to be somewhat confused stating I do not know what I want to do except go back to bed and sleep. Staff reports that he has been confused.   Constitutional: No fever, significant weight change, fatigue  Eyes: No redness, discharge, pain ENT/mouth: No nasal congestion, purulent discharge, earache, change in hearing, sore throat  Cardiovascular: No chest pain, palpitations, paroxysmal nocturnal dyspnea, claudication, edema  Respiratory: No cough, sputum production, hemoptysis, DOE, significant snoring, apnea  Gastrointestinal: No heartburn, abdominal pain, nausea /vomiting, rectal bleeding, melena, change in bowels Genitourinary: No dysuria, hematuria, pyuria, incontinence, nocturia Dermatologic: No rash, pruritus, change in appearance of skin Neurologic: No headache, syncope, seizures Psychiatric: No significant anxiety, depression, insomnia, anorexia Endocrine: No change in hair/skin/nails, excessive thirst, excessive hunger, excessive urination  Hematologic/lymphatic: No significant bruising, lymphadenopathy, abnormal bleeding Allergy/immunology: No itchy/watery  eyes, significant sneezing, urticaria, angioedema  Physical exam:  Pertinent or positive findings:  Pattern  alopecia is present.  He has a mustache.  Facies are somewhat perplexed in appearance.  His responses were slow.  Heart sounds are distant and breath sounds are decreased.  Bowel sounds are also decreased. Abdomen is slightly protuberant.  Foley catheter is present.  Pedal pulses are decreased to palpation.  General appearance: no acute distress, increased work of breathing is present.   Lymphatic: No lymphadenopathy about the head, neck, axilla. Eyes: No conjunctival inflammation or lid edema is present. There is no scleral icterus. Ears:  External ear exam shows no significant lesions or deformities.   Nose:  External nasal examination shows no deformity or inflammation. Nasal mucosa are pink and moist without lesions, exudates Oral exam: Lips and gums are healthy appearing.There is no oropharyngeal erythema or exudate. Neck:  No thyromegaly, masses, tenderness noted.    Heart:  No gallop, murmur, click, rub.  Lungs: without wheezes, rhonchi, rales, rubs. Abdomen: Abdomen is soft and nontender with no organomegaly, hernias, masses. GU: Deferred  Extremities:  No cyanosis, clubbing, edema. Neurologic exam:  Balance, Rhomberg, finger to nose testing could not be completed due to clinical state Skin: Warm & dry w/o tenting. No significant lesions or rash.   T9 vertebral fracture (HCC) PT/OT at SNF as tolerated.  Neurocognitive deficits Staff reports confusion.  MMSE will be performed at the SNF.  Obstructive sleep apnea Close monitor needed because of increased risk of respiratory suppression with opioids.  Chronic kidney disease (CKD), stage III (moderate) (HCC) Current creatinine 1.59 with GFR of 44, BUN 36 indicating CKD high stage III b.  Med list reviewed; no indication for change of meds or dosage at this time but continue to monitor.  Unspecified protein-calorie  malnutrition Current albumin  2.8 & total protein 5.5.  Nutritionist will assess; protein supplementation ordered.  See clinical summary under each active problem in the Problem List with associated updated therapeutic plan : "

## 2024-12-22 NOTE — Patient Instructions (Signed)
 See assessment and plan under each diagnosis in the problem list and acutely for this visit

## 2024-12-25 ENCOUNTER — Emergency Department (HOSPITAL_COMMUNITY)

## 2024-12-25 ENCOUNTER — Encounter (HOSPITAL_COMMUNITY): Payer: Self-pay

## 2024-12-25 ENCOUNTER — Other Ambulatory Visit: Payer: Self-pay

## 2024-12-25 ENCOUNTER — Inpatient Hospital Stay (HOSPITAL_COMMUNITY)
Admission: EM | Admit: 2024-12-25 | Discharge: 2025-01-10 | DRG: 853 | Disposition: A | Discharge status: Skilled Nursing Facility | Source: Skilled Nursing Facility | Attending: Internal Medicine | Admitting: Internal Medicine

## 2024-12-25 ENCOUNTER — Inpatient Hospital Stay (HOSPITAL_COMMUNITY): Admitting: Anesthesiology

## 2024-12-25 ENCOUNTER — Inpatient Hospital Stay (HOSPITAL_COMMUNITY)

## 2024-12-25 ENCOUNTER — Encounter (HOSPITAL_COMMUNITY)
Admission: EM | Disposition: A | Discharge status: Skilled Nursing Facility | Payer: Self-pay | Source: Skilled Nursing Facility | Attending: Internal Medicine

## 2024-12-25 DIAGNOSIS — Z7401 Bed confinement status: Secondary | ICD-10-CM | POA: Diagnosis not present

## 2024-12-25 DIAGNOSIS — E872 Acidosis, unspecified: Secondary | ICD-10-CM | POA: Diagnosis present

## 2024-12-25 DIAGNOSIS — Z66 Do not resuscitate: Secondary | ICD-10-CM | POA: Diagnosis present

## 2024-12-25 DIAGNOSIS — L237 Allergic contact dermatitis due to plants, except food: Secondary | ICD-10-CM | POA: Diagnosis present

## 2024-12-25 DIAGNOSIS — F4323 Adjustment disorder with mixed anxiety and depressed mood: Secondary | ICD-10-CM | POA: Diagnosis not present

## 2024-12-25 DIAGNOSIS — Z882 Allergy status to sulfonamides status: Secondary | ICD-10-CM

## 2024-12-25 DIAGNOSIS — B961 Klebsiella pneumoniae [K. pneumoniae] as the cause of diseases classified elsewhere: Secondary | ICD-10-CM | POA: Diagnosis present

## 2024-12-25 DIAGNOSIS — Z87891 Personal history of nicotine dependence: Secondary | ICD-10-CM | POA: Diagnosis not present

## 2024-12-25 DIAGNOSIS — L8990 Pressure ulcer of unspecified site, unspecified stage: Secondary | ICD-10-CM | POA: Insufficient documentation

## 2024-12-25 DIAGNOSIS — Z515 Encounter for palliative care: Secondary | ICD-10-CM

## 2024-12-25 DIAGNOSIS — R509 Fever, unspecified: Secondary | ICD-10-CM | POA: Diagnosis not present

## 2024-12-25 DIAGNOSIS — F329 Major depressive disorder, single episode, unspecified: Secondary | ICD-10-CM | POA: Diagnosis present

## 2024-12-25 DIAGNOSIS — L89151 Pressure ulcer of sacral region, stage 1: Secondary | ICD-10-CM | POA: Diagnosis present

## 2024-12-25 DIAGNOSIS — N179 Acute kidney failure, unspecified: Secondary | ICD-10-CM | POA: Diagnosis present

## 2024-12-25 DIAGNOSIS — K828 Other specified diseases of gallbladder: Secondary | ICD-10-CM | POA: Diagnosis present

## 2024-12-25 DIAGNOSIS — N1330 Unspecified hydronephrosis: Secondary | ICD-10-CM | POA: Diagnosis not present

## 2024-12-25 DIAGNOSIS — E1122 Type 2 diabetes mellitus with diabetic chronic kidney disease: Secondary | ICD-10-CM | POA: Diagnosis present

## 2024-12-25 DIAGNOSIS — G20C Parkinsonism, unspecified: Secondary | ICD-10-CM | POA: Diagnosis present

## 2024-12-25 DIAGNOSIS — Z7189 Other specified counseling: Secondary | ICD-10-CM | POA: Diagnosis not present

## 2024-12-25 DIAGNOSIS — F418 Other specified anxiety disorders: Secondary | ICD-10-CM | POA: Diagnosis not present

## 2024-12-25 DIAGNOSIS — R0902 Hypoxemia: Secondary | ICD-10-CM | POA: Diagnosis not present

## 2024-12-25 DIAGNOSIS — R319 Hematuria, unspecified: Secondary | ICD-10-CM | POA: Diagnosis present

## 2024-12-25 DIAGNOSIS — I10 Essential (primary) hypertension: Secondary | ICD-10-CM | POA: Diagnosis not present

## 2024-12-25 DIAGNOSIS — Z8551 Personal history of malignant neoplasm of bladder: Secondary | ICD-10-CM

## 2024-12-25 DIAGNOSIS — G4733 Obstructive sleep apnea (adult) (pediatric): Secondary | ICD-10-CM | POA: Diagnosis present

## 2024-12-25 DIAGNOSIS — J9811 Atelectasis: Secondary | ICD-10-CM | POA: Diagnosis present

## 2024-12-25 DIAGNOSIS — N136 Pyonephrosis: Secondary | ICD-10-CM | POA: Diagnosis present

## 2024-12-25 DIAGNOSIS — R6521 Severe sepsis with septic shock: Secondary | ICD-10-CM | POA: Diagnosis present

## 2024-12-25 DIAGNOSIS — B9620 Unspecified Escherichia coli [E. coli] as the cause of diseases classified elsewhere: Secondary | ICD-10-CM | POA: Diagnosis present

## 2024-12-25 DIAGNOSIS — N390 Urinary tract infection, site not specified: Secondary | ICD-10-CM | POA: Diagnosis present

## 2024-12-25 DIAGNOSIS — E46 Unspecified protein-calorie malnutrition: Secondary | ICD-10-CM | POA: Diagnosis not present

## 2024-12-25 DIAGNOSIS — R7989 Other specified abnormal findings of blood chemistry: Secondary | ICD-10-CM | POA: Diagnosis not present

## 2024-12-25 DIAGNOSIS — N200 Calculus of kidney: Secondary | ICD-10-CM | POA: Diagnosis present

## 2024-12-25 DIAGNOSIS — Z8559 Personal history of malignant neoplasm of other urinary tract organ: Secondary | ICD-10-CM

## 2024-12-25 DIAGNOSIS — G8929 Other chronic pain: Secondary | ICD-10-CM | POA: Diagnosis present

## 2024-12-25 DIAGNOSIS — J90 Pleural effusion, not elsewhere classified: Secondary | ICD-10-CM | POA: Diagnosis present

## 2024-12-25 DIAGNOSIS — Z79899 Other long term (current) drug therapy: Secondary | ICD-10-CM

## 2024-12-25 DIAGNOSIS — Z96652 Presence of left artificial knee joint: Secondary | ICD-10-CM | POA: Diagnosis present

## 2024-12-25 DIAGNOSIS — G9341 Metabolic encephalopathy: Secondary | ICD-10-CM | POA: Diagnosis present

## 2024-12-25 DIAGNOSIS — Z8249 Family history of ischemic heart disease and other diseases of the circulatory system: Secondary | ICD-10-CM

## 2024-12-25 DIAGNOSIS — Z825 Family history of asthma and other chronic lower respiratory diseases: Secondary | ICD-10-CM

## 2024-12-25 DIAGNOSIS — A4151 Sepsis due to Escherichia coli [E. coli]: Secondary | ICD-10-CM | POA: Diagnosis not present

## 2024-12-25 DIAGNOSIS — Z888 Allergy status to other drugs, medicaments and biological substances status: Secondary | ICD-10-CM

## 2024-12-25 DIAGNOSIS — I129 Hypertensive chronic kidney disease with stage 1 through stage 4 chronic kidney disease, or unspecified chronic kidney disease: Secondary | ICD-10-CM | POA: Diagnosis present

## 2024-12-25 DIAGNOSIS — K5900 Constipation, unspecified: Secondary | ICD-10-CM | POA: Diagnosis present

## 2024-12-25 DIAGNOSIS — F32A Depression, unspecified: Secondary | ICD-10-CM | POA: Diagnosis not present

## 2024-12-25 DIAGNOSIS — N1832 Chronic kidney disease, stage 3b: Secondary | ICD-10-CM | POA: Diagnosis present

## 2024-12-25 DIAGNOSIS — N320 Bladder-neck obstruction: Secondary | ICD-10-CM | POA: Diagnosis present

## 2024-12-25 DIAGNOSIS — I959 Hypotension, unspecified: Secondary | ICD-10-CM | POA: Diagnosis not present

## 2024-12-25 DIAGNOSIS — B952 Enterococcus as the cause of diseases classified elsewhere: Secondary | ICD-10-CM | POA: Diagnosis present

## 2024-12-25 DIAGNOSIS — A419 Sepsis, unspecified organism: Secondary | ICD-10-CM | POA: Diagnosis present

## 2024-12-25 DIAGNOSIS — E43 Unspecified severe protein-calorie malnutrition: Secondary | ICD-10-CM | POA: Diagnosis present

## 2024-12-25 DIAGNOSIS — L89626 Pressure-induced deep tissue damage of left heel: Secondary | ICD-10-CM | POA: Diagnosis present

## 2024-12-25 DIAGNOSIS — K8020 Calculus of gallbladder without cholecystitis without obstruction: Secondary | ICD-10-CM | POA: Diagnosis present

## 2024-12-25 DIAGNOSIS — N201 Calculus of ureter: Secondary | ICD-10-CM

## 2024-12-25 DIAGNOSIS — Z6824 Body mass index (BMI) 24.0-24.9, adult: Secondary | ICD-10-CM

## 2024-12-25 DIAGNOSIS — E785 Hyperlipidemia, unspecified: Secondary | ICD-10-CM | POA: Diagnosis present

## 2024-12-25 DIAGNOSIS — Z981 Arthrodesis status: Secondary | ICD-10-CM

## 2024-12-25 DIAGNOSIS — R4182 Altered mental status, unspecified: Secondary | ICD-10-CM | POA: Diagnosis not present

## 2024-12-25 DIAGNOSIS — A4181 Sepsis due to Enterococcus: Secondary | ICD-10-CM | POA: Diagnosis not present

## 2024-12-25 HISTORY — PX: CYSTOSCOPY W/ URETERAL STENT PLACEMENT: SHX1429

## 2024-12-25 LAB — CBC WITH DIFFERENTIAL/PLATELET
Abs Immature Granulocytes: 0.1 K/uL — ABNORMAL HIGH (ref 0.00–0.07)
Basophils Absolute: 0 K/uL (ref 0.0–0.1)
Basophils Relative: 0 %
Eosinophils Absolute: 0.1 K/uL (ref 0.0–0.5)
Eosinophils Relative: 0 %
HCT: 39.7 % (ref 39.0–52.0)
Hemoglobin: 12.8 g/dL — ABNORMAL LOW (ref 13.0–17.0)
Immature Granulocytes: 1 %
Lymphocytes Relative: 2 %
Lymphs Abs: 0.3 K/uL — ABNORMAL LOW (ref 0.7–4.0)
MCH: 28.5 pg (ref 26.0–34.0)
MCHC: 32.2 g/dL (ref 30.0–36.0)
MCV: 88.4 fL (ref 80.0–100.0)
Monocytes Absolute: 0.1 K/uL (ref 0.1–1.0)
Monocytes Relative: 0 %
Neutro Abs: 12.1 K/uL — ABNORMAL HIGH (ref 1.7–7.7)
Neutrophils Relative %: 97 %
Platelets: 392 K/uL (ref 150–400)
RBC: 4.49 MIL/uL (ref 4.22–5.81)
RDW: 14.6 % (ref 11.5–15.5)
Smear Review: NORMAL
WBC: 12.6 K/uL — ABNORMAL HIGH (ref 4.0–10.5)
nRBC: 0.2 % (ref 0.0–0.2)

## 2024-12-25 LAB — URINALYSIS, ROUTINE W REFLEX MICROSCOPIC
Bilirubin Urine: NEGATIVE
Glucose, UA: NEGATIVE mg/dL
Ketones, ur: NEGATIVE mg/dL
Nitrite: NEGATIVE
Protein, ur: 100 mg/dL — AB
Specific Gravity, Urine: 1.018 (ref 1.005–1.030)
WBC, UA: >50 WBC/hpf (ref 0–5)
pH: 5 (ref 5.0–8.0)

## 2024-12-25 LAB — COMPREHENSIVE METABOLIC PANEL WITH GFR
ALT: 18 U/L (ref 0–44)
AST: 30 U/L (ref 15–41)
Albumin: 2.8 g/dL — ABNORMAL LOW (ref 3.5–5.0)
Alkaline Phosphatase: 257 U/L — ABNORMAL HIGH (ref 38–126)
Anion gap: 20 — ABNORMAL HIGH (ref 5–15)
BUN: 89 mg/dL — ABNORMAL HIGH (ref 8–23)
CO2: 16 mmol/L — ABNORMAL LOW (ref 22–32)
Calcium: 8.3 mg/dL — ABNORMAL LOW (ref 8.9–10.3)
Chloride: 103 mmol/L (ref 98–111)
Creatinine, Ser: 3.9 mg/dL — ABNORMAL HIGH (ref 0.61–1.24)
GFR, Estimated: 15 mL/min — ABNORMAL LOW
Glucose, Bld: 82 mg/dL (ref 70–99)
Potassium: 4.2 mmol/L (ref 3.5–5.1)
Sodium: 138 mmol/L (ref 135–145)
Total Bilirubin: 0.5 mg/dL (ref 0.0–1.2)
Total Protein: 6.4 g/dL — ABNORMAL LOW (ref 6.5–8.1)

## 2024-12-25 LAB — LACTIC ACID, PLASMA
Lactic Acid, Venous: 2.7 mmol/L (ref 0.5–1.9)
Lactic Acid, Venous: 2.3 mmol/L (ref 0.5–1.9)

## 2024-12-25 LAB — URINALYSIS, W/ REFLEX TO CULTURE (INFECTION SUSPECTED)
Bacteria, UA: NONE SEEN
Bilirubin Urine: NEGATIVE
Glucose, UA: NEGATIVE mg/dL
Ketones, ur: NEGATIVE mg/dL
Nitrite: NEGATIVE
Protein, ur: 100 mg/dL — AB
Specific Gravity, Urine: 1.012 (ref 1.005–1.030)
pH: 7 (ref 5.0–8.0)

## 2024-12-25 LAB — PRO BRAIN NATRIURETIC PEPTIDE: Pro Brain Natriuretic Peptide: 2105 pg/mL — ABNORMAL HIGH

## 2024-12-25 LAB — BLOOD GAS, VENOUS
Acid-base deficit: 0.9 mmol/L (ref 0.0–2.0)
Bicarbonate: 22 mmol/L (ref 20.0–28.0)
Drawn by: 3409
O2 Saturation: 76.3 %
Patient temperature: 39.1
pCO2, Ven: 34 mmHg — ABNORMAL LOW (ref 44–60)
pH, Ven: 7.43 (ref 7.25–7.43)
pO2, Ven: 50 mmHg — ABNORMAL HIGH (ref 32–45)

## 2024-12-25 LAB — TROPONIN T, HIGH SENSITIVITY
Troponin T High Sensitivity: 46 ng/L — ABNORMAL HIGH (ref 0–19)
Troponin T High Sensitivity: 48 ng/L — ABNORMAL HIGH (ref 0–19)

## 2024-12-25 LAB — PROTIME-INR
INR: 1.3 — ABNORMAL HIGH (ref 0.8–1.2)
Prothrombin Time: 16.5 s — ABNORMAL HIGH (ref 11.4–15.2)

## 2024-12-25 LAB — GLUCOSE, CAPILLARY: Glucose-Capillary: 90 mg/dL (ref 70–99)

## 2024-12-25 LAB — MAGNESIUM: Magnesium: 2.1 mg/dL (ref 1.7–2.4)

## 2024-12-25 LAB — CBG MONITORING, ED: Glucose-Capillary: 78 mg/dL (ref 70–99)

## 2024-12-25 MED ORDER — LACTATED RINGERS IV BOLUS: 500.0000 mL | Freq: Once | INTRAVENOUS | Status: DC

## 2024-12-25 MED ORDER — LIDOCAINE 5 % EX PTCH
1.0000 | MEDICATED_PATCH | CUTANEOUS | Status: DC
  Administered 2024-12-26 – 2025-01-09 (×14): 1 via TRANSDERMAL
  Filled 2024-12-25 (×15): qty 1

## 2024-12-25 MED ORDER — CHLORHEXIDINE GLUCONATE CLOTH 2 % EX PADS
6.0000 | MEDICATED_PAD | Freq: Every day | CUTANEOUS | Status: DC
  Administered 2024-12-25 – 2025-01-10 (×17): 6 via TOPICAL

## 2024-12-25 MED ORDER — ONDANSETRON HCL 4 MG/2ML IJ SOLN
INTRAMUSCULAR | Status: DC | PRN
  Administered 2024-12-25: 4 mg via INTRAVENOUS

## 2024-12-25 MED ORDER — HEPARIN SODIUM (PORCINE) 5000 UNIT/ML IJ SOLN
5000.0000 [IU] | Freq: Three times a day (TID) | INTRAMUSCULAR | Status: DC
  Administered 2024-12-26 – 2025-01-10 (×45): 5000 [IU] via SUBCUTANEOUS
  Filled 2024-12-25 (×45): qty 1

## 2024-12-25 MED ORDER — ACETAMINOPHEN 650 MG RE SUPP
650.0000 mg | Freq: Once | RECTAL | Status: AC
  Administered 2024-12-25: 650 mg via RECTAL
  Filled 2024-12-25: qty 1

## 2024-12-25 MED ORDER — 0.9 % SODIUM CHLORIDE (POUR BTL) OPTIME
TOPICAL | Status: DC | PRN
  Administered 2024-12-25: 1000 mL

## 2024-12-25 MED ORDER — SODIUM CHLORIDE 0.9 % IV SOLN
2.0000 g | Freq: Once | INTRAVENOUS | Status: AC
  Administered 2024-12-25: 2 g via INTRAVENOUS
  Filled 2024-12-25: qty 12.5

## 2024-12-25 MED ORDER — VANCOMYCIN HCL IN DEXTROSE 1-5 GM/200ML-% IV SOLN: 1000.0000 mg | Freq: Once | INTRAVENOUS | Status: DC

## 2024-12-25 MED ORDER — LACTATED RINGERS IV BOLUS
500.0000 mL | Freq: Once | INTRAVENOUS | Status: AC
  Administered 2024-12-25: 500 mL via INTRAVENOUS

## 2024-12-25 MED ORDER — PROPOFOL 10 MG/ML IV BOLUS
INTRAVENOUS | Status: DC | PRN
  Administered 2024-12-25: 70 mg via INTRAVENOUS

## 2024-12-25 MED ORDER — METRONIDAZOLE 500 MG/100ML IV SOLN
500.0000 mg | Freq: Once | INTRAVENOUS | Status: AC
  Administered 2024-12-25: 500 mg via INTRAVENOUS
  Filled 2024-12-25: qty 100

## 2024-12-25 MED ORDER — SODIUM CHLORIDE 0.9 % IV SOLN
2.0000 g | INTRAVENOUS | Status: DC
  Administered 2024-12-26 – 2024-12-27 (×2): 2 g via INTRAVENOUS
  Filled 2024-12-25 (×2): qty 20

## 2024-12-25 MED ORDER — SODIUM CHLORIDE 0.9 % IR SOLN
Status: DC | PRN
  Administered 2024-12-25: 3000 mL via INTRAVESICAL

## 2024-12-25 MED ORDER — LACTATED RINGERS IV BOLUS
1000.0000 mL | Freq: Once | INTRAVENOUS | Status: AC
  Administered 2024-12-25: 1000 mL via INTRAVENOUS

## 2024-12-25 MED ORDER — METHOCARBAMOL 1000 MG/10ML IJ SOLN
500.0000 mg | Freq: Four times a day (QID) | INTRAMUSCULAR | Status: DC | PRN
  Administered 2024-12-27 – 2025-01-10 (×13): 500 mg via INTRAVENOUS
  Filled 2024-12-25: qty 10
  Filled 2024-12-25: qty 5
  Filled 2024-12-25 (×2): qty 10
  Filled 2024-12-25 (×3): qty 5
  Filled 2024-12-25: qty 10
  Filled 2024-12-25 (×2): qty 5
  Filled 2024-12-25 (×4): qty 10
  Filled 2024-12-25: qty 5

## 2024-12-25 MED ORDER — LIDOCAINE HCL (PF) 1 % IJ SOLN
5.0000 mL | INTRAMUSCULAR | Status: DC
  Administered 2024-12-25: 5 mL

## 2024-12-25 MED ORDER — NOREPINEPHRINE 4 MG/250ML-% IV SOLN
0.0000 ug/min | INTRAVENOUS | Status: DC
  Administered 2024-12-25: 8 ug/min via INTRAVENOUS
  Administered 2024-12-25: 25 ug/min via INTRAVENOUS
  Administered 2024-12-25: 2 ug/min via INTRAVENOUS
  Filled 2024-12-25 (×4): qty 250

## 2024-12-25 MED ORDER — BISACODYL 10 MG RE SUPP: 10.0000 mg | Freq: Every day | RECTAL | Status: DC | PRN

## 2024-12-25 MED ORDER — VANCOMYCIN HCL 2000 MG/400ML IV SOLN
2000.0000 mg | Freq: Once | INTRAVENOUS | Status: AC
  Administered 2024-12-25: 2000 mg via INTRAVENOUS
  Filled 2024-12-25: qty 400

## 2024-12-25 MED ORDER — IOHEXOL 300 MG/ML  SOLN
INTRAMUSCULAR | Status: DC | PRN
  Administered 2024-12-25: 6 mL via URETHRAL

## 2024-12-25 MED ORDER — HYDROMORPHONE HCL 1 MG/ML IJ SOLN
0.5000 mg | INTRAMUSCULAR | Status: DC | PRN
  Administered 2024-12-25 – 2024-12-27 (×6): 0.5 mg via INTRAVENOUS
  Filled 2024-12-25 (×6): qty 1

## 2024-12-25 MED ORDER — SODIUM CHLORIDE 0.9 % IV SOLN: INTRAVENOUS | Status: DC | PRN

## 2024-12-25 MED ORDER — ALBUMIN HUMAN 5 % IV SOLN: INTRAVENOUS | Status: DC | PRN

## 2024-12-25 MED ORDER — POLYETHYLENE GLYCOL 3350 17 G PO PACK
17.0000 g | PACK | Freq: Every day | ORAL | Status: DC
  Administered 2024-12-26 – 2025-01-07 (×7): 17 g via ORAL
  Filled 2024-12-25 (×11): qty 1

## 2024-12-25 MED ORDER — DEXAMETHASONE SOD PHOSPHATE PF 10 MG/ML IJ SOLN
INTRAMUSCULAR | Status: DC | PRN
  Administered 2024-12-25: 4 mg via INTRAVENOUS

## 2024-12-25 MED ORDER — ACETAMINOPHEN 650 MG RE SUPP: 650.0000 mg | Freq: Four times a day (QID) | RECTAL | Status: DC | PRN

## 2024-12-25 MED ORDER — FENTANYL CITRATE (PF) 100 MCG/2ML IJ SOLN
50.0000 ug | Freq: Once | INTRAMUSCULAR | Status: AC
  Administered 2024-12-25: 50 ug via INTRAVENOUS
  Filled 2024-12-25: qty 2

## 2024-12-25 NOTE — H&P (Addendum)
 "  NAME:  Zachary [name], MRN:  [id number], DOB:  [birth year], LOS: 0 ADMISSION DATE:  12/25/2024, CONSULTATION DATE:  12/25/24 REFERRING MD:  Dr. Melvenia, CHIEF COMPLAINT:  AMS   History of Present Illness:   35 yoM with PMH as below significant for HTN, HLD, CKD 3b, bladder cancer s/p multiple surgeries and TURBT (12/2022), low-grade papillary urethral carcinoma, urinary retention requiring intermittent catheterizations 3-4x/ day, pre-DM, OSA, chronic pain, nephrolithiasis, anxiety, and depression.   Recent hospitalization with T9 vertebral fracture after mechanical fall s/p posterior decompression and fusion from T7-T11 on 12/17 with Dr. Onetha discharged to [facility] rehab 12/23.  Sent from rehab with altered mental status, hypotension, tachycardia, and hypoxia.  At baseline, ambulatory and conversant.  Treated with EMS with IM rocephin , 1L LR, and NRB.  Wife reported worsening mentation for 3 days.   In ER, febrile 102.4, tachycardic 118, NRB weaned to [place] 6L and persistent hypotension requiring norepinephrine  despite additional 2L LR, oriented to self, MAE and following commands. Bladder scan with > 1L in bladder despite foley placement which subsequently was exchanged with large volume purulent drainage.    Labs notable for BUN/ sCr 89/ 3.9 (most recently 24/ 1.59 on 12/23), bicarb 16, alk phos 257, albumin  2.8, protein 6.4, pBNP 2105, trop 46, lactic acid 2.7> 2.3, WBC 12.6, Hgb 12.8, INR 1.3, VBG 7.43/ 34/ 50/ 22, UA turbid, mod leukocytes, small Hgb otherwise neg nitrates, no bacteria, RBC 0-5, WBC 0-5, CXR no acute cardiopulmonary process, CT head NAICA, CT chest/ abd/ pelvis wo contrast showing moderate right sided hydronephrosis due to 4mm distal right ureteral stone without perinephric or periureteral stranding, moderately distended gallbladder without visible stones or inflammatory changes, small bilateral pleural effusions with bibasilar atelectasis.  Blood and urine cultures sent, stated on  cefepime , flagyl , and vancomycin .  Right femoral CVL placed in ER .  To be transferred to [facility] for further ICU evaluation and management.   On arrival, pt alert and oriented x 3.  Complaining of midline back pain, denies flank pain.  Also c/o of mild umbilical pain, unsure when last BM was but charted type 6/ mushy in ER.  Denies SOB, CP, N/V, or lower leg pain or swelling.   Pertinent  Medical History   Past Medical History:  Diagnosis Date   Anxiety    takes Valium  as needed   Arthritis    Bladder cancer (HCC)    takes Rapaflo daily   Blood dyscrasia    07/08/16: pt told he was a free bleeder after bleeding a lot when derm cut off mole on forehead. No excessive bleeding with minor wounds at home, no bleeding problems perioperatively with prior surgeries.   Chronic back pain    spondylolisthesis   Cluster headaches    Depression    takes Lexapro  daily   Essential hypertension, benign    takes Diovan -HCT daily   Hearing loss    hearing aids   History of kidney stones    Hx of complications due to general anesthesia    Aborted surgery 07/15/2016 due to hypotension per pt   Intertrochanteric fracture of left femur (HCC) 02/09/2023   Joint pain    Kidney stone 03/2019   passed stone   Obstructive sleep apnea on CPAP    uses CPAP @ night   Parkinsonism (HCC)    Pneumonia    several times--last time about 3-4 yrs ago   Pre-diabetes    Prediabetes    Syncopal episodes  Thyroid  condition   Former smoker  Significant Hospital Events: Including procedures, antibiotic start and stop dates in addition to other pertinent events   12/29 tx from [facility] ER, septic shock  Interim History / Subjective:  Given fentanyl  50 for back pain and weaning NE 25> now at 11  Objective    Blood pressure 102/64, pulse 92, temperature 99.6 F (37.6 C), resp. rate (!) 39, SpO2 96%.        Intake/Output Summary (Last 24 hours) at 12/25/2024 1446 Last data filed at 12/25/2024 1435 Gross per  24 hour  Intake 2919.46 ml  Output 4080 ml  Net -1160.54 ml   There were no vitals filed for this visit.  Examination: General:  AoC ill appearing elderly male lying in bed in NAD HEENT: MM pink/dry, pupils 2/r, anicteric, no JVP Neuro: Awake, oriented x 3, MAE- generalized weakness CV: rr, SR PULM:  non labored, clear anteriorly, few posterior left basilar rales, salter HFNC at 5L GI: soft, bs+, mild umbilical tenderness, foley with purulent drainage, no CVA tenderness Extremities: warm/dry, no LE edema  Skin: no rashes, posterior incision with steri-strips intact, some redness but no drainage or tenderness         Resolved problem list   Assessment and Plan   Septic shock, urosepsis  Lactic acidosis  Elevated troponin / pBNP - cont to wean NE for MAP goal >65, as weaning pressors, hold off on vasopressin  - lactic acid trending down - cont ceftriaxone  for now- previous UC with multiple species  - needs echo with elevated pBNP 2100, trop 46, EKG non acute.  Repeat trop.  No CP - tylenol  prn    Hypoxia likely in setting of atelectasis and small bilateral pleural effusions  OSA - at bedtime CPAP and prn  - cont supplemental O2 for sat goal > 92% - aggressive pulm hygiene/ IS - monitor effusions on bedside US , defer diureses with AKI/ shock - PE in differential with recent hospitalization, hypoxia, elevated biomarkers> however RV reassuring on bedside POCUS   Acute metabolic encephalopathy, improved - CTH neg, no focal deficits - currently oriented x 4 - cont supportive care/ abx as above   AKI on CKD IIIb in setting of hypotension, ARB, and obstructive uropathy/ occluded foley Moderate right hydronephrosis due to 4mm distal right ureteral stone AGMA/ lactic acidosis  Urinary retention requiring self cath 3-4x/ day but foley replaced in rehab - baseline sCr 1.8- 2, now 3.9 P:  - foley exchanged in ER - consult urology - Trend BMP / urinary output - Replace  electrolytes as indicated - Avoid nephrotoxic agents, ensure adequate renal perfusion   HTN - hold pta ARB and coreg  with hypotension requiring vasopressor support - echo as above   HLD - hold statin    Protein calorie malnutrition - optimize nutrition as appropriate- NPO for now while encephalopathic    T9 vertebral fx s/p posterior decompression and fusion from T7-T11 on 12/17 with Dr. Onetha - site does not appear infected - PT/ OT w/ back brace - multimodal pain management- tylenol , robaxin , dilaudid  prn, minimize narcotics as able  Chronic pain  Anxiety/ depression  - hold Cymbalta .  On norco prior to December hospitalization   Constipation - type 6 mushy BM at ER.  No bowel obstruction on CT, bowel regimen as appropriate    Poor mobility - using walker prior to admit in December - PT/ OT as above   Left heel DTPI Stage 1 sacrum pressure injury - WOC  consult   GOC - previously DNR, arrived with [place] DNR form.  Wife updated by phone by Dr. Theodoro.  Ok with intubation, pressors.   Labs   CBC: Recent Labs  Lab 12/19/24 0411 12/25/24 0922  WBC 9.1 12.6*  NEUTROABS 6.3 12.1*  HGB 11.0* 12.8*  HCT 33.6* 39.7  MCV 89.4 88.4  PLT 298 392    Basic Metabolic Panel: Recent Labs  Lab 12/19/24 0411 12/25/24 0922  NA 137 138  K 3.7 4.2  CL 101 103  CO2 29 16*  GLUCOSE 97 82  BUN 24* 89*  CREATININE 1.59* 3.90*  CALCIUM 8.4* 8.3*  MG  --  2.1   GFR: Estimated Creatinine Clearance: 17.9 mL/min (A) (by C-G formula based on SCr of 3.9 mg/dL (H)). Recent Labs  Lab 12/19/24 0411 12/25/24 0922 12/25/24 1115  WBC 9.1 12.6*  --   LATICACIDVEN  --  2.7* 2.3*    Liver Function Tests: Recent Labs  Lab 12/25/24 0922  AST 30  ALT 18  ALKPHOS 257*  BILITOT 0.5  PROT 6.4*  ALBUMIN  2.8*   No results for input(s): LIPASE, AMYLASE in the last 168 hours. No results for input(s): AMMONIA in the last 168 hours.  ABG    Component Value  Date/Time   PHART 7.374 12/13/2024 1620   PCO2ART 42.1 12/13/2024 1620   PO2ART 139 (H) 12/13/2024 1620   HCO3 22.0 12/25/2024 1008   TCO2 26 12/13/2024 1620   ACIDBASEDEF 0.9 12/25/2024 1008   O2SAT 76.3 12/25/2024 1008     Coagulation Profile: Recent Labs  Lab 12/25/24 0922  INR 1.3*    Cardiac Enzymes: No results for input(s): CKTOTAL, CKMB, CKMBINDEX, TROPONINI in the last 168 hours.  HbA1C: Hgb A1c MFr Bld  Date/Time Value Ref Range Status  12/01/2022 09:27 AM 6.3 4.6 - 6.5 % Final    Comment:    Glycemic Control Guidelines for People with Diabetes:Non Diabetic:  <6%Goal of Therapy: <7%Additional Action Suggested:  >8%   07/22/2016 07:00 AM 5.8 (H) 4.8 - 5.6 % Final    Comment:    (NOTE)         Pre-diabetes: 5.7 - 6.4         Diabetes: >6.4         Glycemic control for adults with diabetes: <7.0     CBG: No results for input(s): GLUCAP in the last 168 hours.  Review of Systems:   Review of Systems  Constitutional:  Positive for fever.  Respiratory:  Negative for cough, sputum production and shortness of breath.   Cardiovascular:  Negative for chest pain and leg swelling.  Gastrointestinal:  Positive for abdominal pain. Negative for nausea.  Genitourinary:  Negative for flank pain.  Neurological:  Negative for focal weakness.   Past Medical History:  He,  has a past medical history of Anxiety, Arthritis, Bladder cancer (HCC), Blood dyscrasia, Chronic back pain, Cluster headaches, Depression, Essential hypertension, benign, Hearing loss, History of kidney stones, complications due to general anesthesia, Intertrochanteric fracture of left femur (HCC) (02/09/2023), Joint pain, Kidney stone (03/2019), Obstructive sleep apnea on CPAP, Parkinsonism (HCC), Pneumonia, Pre-diabetes, Prediabetes, Syncopal episodes, and Thyroid  condition.   Surgical History:   Past Surgical History:  Procedure Laterality Date   ANTERIOR CERVICAL DECOMP/DISCECTOMY FUSION N/A  06/05/2013   Procedure:  C3-4 Anterior Cervical Discectomy and Fusion, Allograft, Plate;  Surgeon: Oneil JAYSON Herald, MD;  Location: [facility] OR;  Service: Orthopedics;  Laterality: N/A;  C3-4 Anterior Cervical Discectomy  and Fusion, Allograft, Plate   Arthroscopic knee surgery     BACK SURGERY      x 2   BLADDER SURGERY     x 5 to remove tumor   CARPAL TUNNEL RELEASE     CATARACT EXTRACTION W/PHACO  12/19/2012   Procedure: CATARACT EXTRACTION PHACO AND INTRAOCULAR LENS PLACEMENT (IOC);  Surgeon: Cherene Mania, MD;  Location: [facility] ORS;  Service: Ophthalmology;  Laterality: Left;  CDE: 26.80   CATARACT EXTRACTION W/PHACO  01/09/2013   Procedure: CATARACT EXTRACTION PHACO AND INTRAOCULAR LENS PLACEMENT (IOC);  Surgeon: Cherene Mania, MD;  Location: [facility] ORS;  Service: Ophthalmology;  Laterality: Right;  CDE:22.83   CERVICAL FUSION  06/05/2013   C 3  C4   CYSTOSCOPY     CYSTOSCOPY W/ URETERAL STENT PLACEMENT Bilateral 02/12/2020   Procedure: CYSTOSCOPY WITH RETROGRADE PYELOGRAM/URETERAL STENT PLACEMENT;  Surgeon: Watt Rush, MD;  Location: [facility] ORS;  Service: Urology;  Laterality: Bilateral;   INTRAMEDULLARY (IM) NAIL INTERTROCHANTERIC Left 02/10/2023   Procedure: LEFT INTRAMEDULLARY (IM) NAILING OF LEFT FEMUR;  Surgeon: Kendal Franky SQUIBB, MD;  Location: [facility] OR;  Service: Orthopedics;  Laterality: Left;   LAMINECTOMY WITH POSTERIOR LATERAL ARTHRODESIS LEVEL 4 N/A 12/13/2024   Procedure: Thoracic Seven-Thoracic Eight - Thoracic Eight-Thoracic Nine - Thoracic Nine-Thoracic Ten, Thoracic Ten-Thoracic Eleven pedicle screw fixation and posterolateral arthrodesis decompression of Thoracic Eight;  Surgeon: Onetha Kuba, MD;  Location: [facility] OR;  Service: Neurosurgery;  Laterality: N/A;  T7-T8 - T8-T9 - T9-T10, T10-11 pedicle screw fixation and posterolateral arthrodesis  decompress   LITHOTRIPSY  02/2020   REFRACTIVE SURGERY     Hx: of   RETINAL DETACHMENT SURGERY     TOTAL KNEE ARTHROPLASTY Left 02/01/2018   Procedure: TOTAL KNEE  ARTHROPLASTY;  Surgeon: Margrette Taft BRAVO, MD;  Location: [facility];  Service: Orthopedics;  Laterality: Left;   TRANSURETHRAL RESECTION OF BLADDER TUMOR Right 07/27/2015   Procedure: TRANSURETHRAL RESECTION OF BLADDER TUMOR (TURBT);  Surgeon: Arlena Gal, MD;  Location: [facility];  Service: Urology;  Laterality: Right;   TRANSURETHRAL RESECTION OF BLADDER TUMOR WITH MITOMYCIN -C N/A 01/25/2023   Procedure: TRANSURETHRAL RESECTION OF BLADDER TUMOR WITH GEMCITABINE ;  Surgeon: Carolee Sherwood JONETTA DOUGLAS, MD;  Location: [facility] ORS;  Service: Urology;  Laterality: N/A;   wisdom tooth extraction       Social History:   reports that he quit smoking about 48 years ago. His smoking use included cigarettes and cigars. He started smoking about 49 years ago. He has a 0.3 pack-year smoking history. He has been exposed to tobacco smoke. He has never used smokeless tobacco. He reports that he does not drink alcohol  and does not use drugs.   Family History:  His family history includes Alcohol  abuse in his maternal grandfather and mother; Anxiety disorder in his sister; COPD in his mother; Cancer in his father; Depression in his mother; Hypertension in his father, mother, and sister. There is no history of ADD / ADHD, Bipolar disorder, Dementia, Drug abuse, OCD, Paranoid behavior, Schizophrenia, Seizures, Sexual abuse, or Physical abuse.   Allergies Allergies[1]   Home Medications  Prior to Admission medications  Medication Sig Start Date End Date Taking? Authorizing Provider  acetaminophen  (TYLENOL ) 500 MG tablet Take 500 mg by mouth every 8 (eight) hours as needed (pain level 1-3/ headache/fever).    [provider]  carvedilol  (COREG ) 6.25 MG tablet Take 1 tablet (6.25 mg total) by mouth 2 (two) times daily. 12/19/24   Al-Sultani, Anmar, MD  dorzolamide-timolol (COSOPT)  2-0.5 % ophthalmic solution Place 1 drop into both eyes 2 (two) times daily. 01/02/23   [provider]  DULoxetine  (CYMBALTA ) 60 MG  capsule Take 1 capsule (60 mg total) by mouth 2 (two) times daily. 11/15/24   Okey Barnie SAUNDERS, MD  latanoprost (XALATAN) 0.005 % ophthalmic solution Place 1 drop into both eyes at bedtime.    [provider]  methocarbamol  (ROBAXIN ) 500 MG tablet Take 1 tablet (500 mg total) by mouth 3 (three) times daily. 12/19/24   Al-Sultani, Anmar, MD  Multiple Vitamin (MULTIVITAMIN) tablet Take 1 tablet by mouth daily.    [provider]  Multiple Vitamins-Minerals (PRESERVISION AREDS PO) Take 1 capsule by mouth in the morning and at bedtime.    [provider]  Omega-3 Fatty Acids (FISH OIL) 1000 MG CAPS Take 1 capsule by mouth daily.    [provider]  oxyCODONE  10 MG TABS Take 1 tablet (10 mg total) by mouth every 6 (six) hours as needed. 12/18/24 12/18/25  Swayze, Ava, DO  pravastatin  (PRAVACHOL ) 10 MG tablet Take 10 mg by mouth at bedtime. 01/23/20   [provider]  Protein (PROSOURCE PO) Take 30 mLs by mouth 3 (three) times daily.    [provider]  valsartan  (DIOVAN ) 320 MG tablet Take 320 mg by mouth daily.    [provider]         CRITICAL CARE Performed by: Lyle Pesa   Total critical care time: 48 minutes  Critical care time was exclusive of separately billable procedures and treating other patients.  Critical care was necessary to treat or prevent imminent or life-threatening deterioration.  Critical care was time spent personally by me on the following activities: development of treatment plan with patient and/or surrogate as well as nursing, discussions with consultants, evaluation of patient's response to treatment, examination of patient, obtaining history from patient or surrogate, ordering and performing treatments and interventions, ordering and review of laboratory studies, ordering and review of radiographic studies, pulse oximetry and re-evaluation of patient's condition.    Lyle Pesa, NP [facility] 12/25/2024, 5:53 PM  See Amion for pager If no response to pager , please call [phone] until 7pm After 7:00 pm call Elink  [phone]            [1]  Allergies Allergen Reactions   Poison Oak Extract Hives and Itching   Sulfa Antibiotics Other (See Comments)    Unknown    Sulfonamide Derivatives Itching and Rash   "

## 2024-12-25 NOTE — ED Triage Notes (Signed)
 Patient arrives via [facility] from [facility] c/c AMS. Patient LKW was 0700 by nursing home staff. Patient was found prior to EMS arrival to be altered and satting 84% on room air. Currently on 15 LPM NRB satting 96%. EMS gave rocephin  IM  and 1 L LR en route. CO2 level en route was 19-20. BP 80/40. ST 124.

## 2024-12-25 NOTE — ED Notes (Signed)
CBG 78. 

## 2024-12-25 NOTE — Consult Note (Signed)
 Urology Consult   Physician requesting consult: Sammi Fredericks, MD   Reason for consult: septic stone  History of Present Illness: Zachary [name] is a 78 y.o. male with PMH HTN, HLD, prediabetes, OSA, anxiety, depression who was recently discharged to a rehab facility after spoal surgery with a foley catheter in place. He was brought to [facility] ED earlier today from his rehab facility with c/f AMS.   In the ED, patient was hypotensive and tachycardic. Labs notable for leukocytosis to 12.6. Lactate 2.7. Cr 3.9 from baseline ~1.7. Patient's catheter was found to be occluded and was replaced with return of purulent urine. UA showed moderate leukocytes, nitrite negative, >50 WBC, 0-5 RBC, many bacteria.   CT showed a 4 mm distal right ureteral stone with moderate right hydronephrosis.   He was transferred from [facility] ED to [facility], and is now admitted to the ICU. Currently on 5L [place], weaning. On levophed .   The patient has previously seen Dr. Nieves and Dr. Carolee at [facility] for nephrolithiasis and BOO--he had previously been on CIC and with plans for bladder outlet procedure though this does not appear to have taken place.    Past Medical History:  Diagnosis Date   Anxiety    takes Valium  as needed   Arthritis    Bladder cancer (HCC)    takes Rapaflo daily   Blood dyscrasia    07/08/16: pt told he was a free bleeder after bleeding a lot when derm cut off mole on forehead. No excessive bleeding with minor wounds at home, no bleeding problems perioperatively with prior surgeries.   Chronic back pain    spondylolisthesis   Cluster headaches    Depression    takes Lexapro  daily   Essential hypertension, benign    takes Diovan -HCT daily   Hearing loss    hearing aids   History of kidney stones    Hx of complications due to general anesthesia    Aborted surgery 07/15/2016 due to hypotension per pt   Intertrochanteric fracture of left femur (HCC) 02/09/2023   Joint pain    Kidney stone 03/2019   passed stone    Obstructive sleep apnea on CPAP    uses CPAP @ night   Parkinsonism (HCC)    Pneumonia    several times--last time about 3-4 yrs ago   Pre-diabetes    Prediabetes    Syncopal episodes    Thyroid  condition     Past Surgical History:  Procedure Laterality Date   ANTERIOR CERVICAL DECOMP/DISCECTOMY FUSION N/A 06/05/2013   Procedure:  C3-4 Anterior Cervical Discectomy and Fusion, Allograft, Plate;  Surgeon: Oneil JAYSON Herald, MD;  Location: [facility] OR;  Service: Orthopedics;  Laterality: N/A;  C3-4 Anterior Cervical Discectomy and Fusion, Allograft, Plate   Arthroscopic knee surgery     BACK SURGERY      x 2   BLADDER SURGERY     x 5 to remove tumor   CARPAL TUNNEL RELEASE     CATARACT EXTRACTION W/PHACO  12/19/2012   Procedure: CATARACT EXTRACTION PHACO AND INTRAOCULAR LENS PLACEMENT (IOC);  Surgeon: Cherene Mania, MD;  Location: [facility]S;  Service: Ophthalmology;  Laterality: Left;  CDE: 26.80   CATARACT EXTRACTION W/PHACO  01/09/2013   Procedure: CATARACT EXTRACTION PHACO AND INTRAOCULAR LENS PLACEMENT (IOC);  Surgeon: Cherene Mania, MD;  Location: [facility];  Service: Ophthalmology;  Laterality: Right;  CDE:22.83   CERVICAL FUSION  06/05/2013   C 3  C4   CYSTOSCOPY  CYSTOSCOPY W/ URETERAL STENT PLACEMENT Bilateral 02/12/2020   Procedure: CYSTOSCOPY WITH RETROGRADE PYELOGRAM/URETERAL STENT PLACEMENT;  Surgeon: Watt Rush, MD;  Location: [facility];  Service: Urology;  Laterality: Bilateral;   INTRAMEDULLARY (IM) NAIL INTERTROCHANTERIC Left 02/10/2023   Procedure: LEFT INTRAMEDULLARY (IM) NAILING OF LEFT FEMUR;  Surgeon: Kendal Franky SQUIBB, MD;  Location: [facility] OR;  Service: Orthopedics;  Laterality: Left;   LAMINECTOMY WITH POSTERIOR LATERAL ARTHRODESIS LEVEL 4 N/A 12/13/2024   Procedure: Thoracic Seven-Thoracic Eight - Thoracic Eight-Thoracic Nine - Thoracic Nine-Thoracic Ten, Thoracic Ten-Thoracic Eleven pedicle screw fixation and posterolateral arthrodesis decompression of Thoracic Eight;  Surgeon: Onetha Kuba, MD;  Location: [facility] OR;  Service: Neurosurgery;  Laterality: N/A;  T7-T8 - T8-T9 - T9-T10, T10-11 pedicle screw fixation and posterolateral arthrodesis  decompress   LITHOTRIPSY  02/2020   REFRACTIVE SURGERY     Hx: of   RETINAL DETACHMENT SURGERY     TOTAL KNEE ARTHROPLASTY Left 02/01/2018   Procedure: TOTAL KNEE ARTHROPLASTY;  Surgeon: Margrette Taft BRAVO, MD;  Location: [facility];  Service: Orthopedics;  Laterality: Left;   TRANSURETHRAL RESECTION OF BLADDER TUMOR Right 07/27/2015   Procedure: TRANSURETHRAL RESECTION OF BLADDER TUMOR (TURBT);  Surgeon: Arlena Gal, MD;  Location: [facility];  Service: Urology;  Laterality: Right;   TRANSURETHRAL RESECTION OF BLADDER TUMOR WITH MITOMYCIN -C N/A 01/25/2023   Procedure: TRANSURETHRAL RESECTION OF BLADDER TUMOR WITH GEMCITABINE ;  Surgeon: Carolee Sherwood JONETTA DOUGLAS, MD;  Location: [facility];  Service: Urology;  Laterality: N/A;   wisdom tooth extraction      Current Hospital Medications:  Home Meds: Medications Ordered Prior to Encounter[1]   Scheduled Meds:  Chlorhexidine  Gluconate Cloth  6 each Topical Daily   heparin   5,000 Units Subcutaneous Q8H   lidocaine   1 patch Transdermal Q24H   lidocaine  (PF)  5 mL     [START ON 12/26/2024] polyethylene glycol  17 g Oral Daily   Continuous Infusions:  [START ON 12/26/2024] cefTRIAXone  (ROCEPHIN )  IV     norepinephrine  (LEVOPHED ) Adult infusion 25 mcg/min (12/25/24 1406)   PRN Meds:.acetaminophen , bisacodyl , HYDROmorphone  (DILAUDID ) injection, lidocaine  (PF), methocarbamol  (ROBAXIN ) injection  Allergies: Allergies[2]  Family History  Problem Relation Age of Onset   Hypertension Father    Cancer Father        Prostate   Depression Mother    Hypertension Mother    Alcohol  abuse Mother    COPD Mother        passed 07-08-17   Hypertension Sister    Anxiety disorder Sister    Alcohol  abuse Maternal Grandfather    ADD / ADHD Neg Hx    Bipolar disorder Neg Hx    Dementia Neg Hx    Drug abuse  Neg Hx    OCD Neg Hx    Paranoid behavior Neg Hx    Schizophrenia Neg Hx    Seizures Neg Hx    Sexual abuse Neg Hx    Physical abuse Neg Hx     Social History:  reports that he quit smoking about 48 years ago. His smoking use included cigarettes and cigars. He started smoking about 49 years ago. He has a 0.3 pack-year smoking history. He has been exposed to tobacco smoke. He has never used smokeless tobacco. He reports that he does not drink alcohol  and does not use drugs.  ROS: A complete review of systems was performed.  All systems are negative except for pertinent findings as noted.  Physical Exam:  Vital signs in last 24 hours:  Temp:  [99.4 F (37.4 C)-102.2 F (39 C)] 100.1 F (37.8 C) (12/29 1600) Pulse Rate:  [90-114] 90 (12/29 1752) Resp:  [15-48] 18 (12/29 1752) BP: (78-116)/(36-77) 103/65 (12/29 1745) SpO2:  [93 %-100 %] 100 % (12/29 1752) Constitutional:  Alert and oriented, No acute distress Cardiovascular: Regular rate and rhythm, No JVD Respiratory: Normal respiratory effort, Lungs clear bilaterally GI: Abdomen is soft, nontender, nondistended, no abdominal masses GU: No CVA tenderness Lymphatic: No lymphadenopathy Neurologic: Grossly intact, no focal deficits Psychiatric: Normal mood and affect  Laboratory Data:  Recent Labs    12/25/24 0922  WBC 12.6*  HGB 12.8*  HCT 39.7  PLT 392    Recent Labs    12/25/24 0922  NA 138  K 4.2  CL 103  GLUCOSE 82  BUN 89*  CALCIUM 8.3*  CREATININE 3.90*     Results for orders placed or performed during the hospital encounter of 12/25/24 (from the past 24 hours)  Lactic acid, plasma     Status: Abnormal   Collection Time: 12/25/24  9:22 AM  Result Value Ref Range   Lactic Acid, Venous 2.7 (HH) 0.5 - 1.9 mmol/L  Comprehensive metabolic panel     Status: Abnormal   Collection Time: 12/25/24  9:22 AM  Result Value Ref Range   Sodium 138 135 - 145 mmol/L   Potassium 4.2 3.5 - 5.1 mmol/L   Chloride 103 98 -  111 mmol/L   CO2 16 (L) 22 - 32 mmol/L   Glucose, Bld 82 70 - 99 mg/dL   BUN 89 (H) 8 - 23 mg/dL   Creatinine, Ser 6.09 (H) 0.61 - 1.24 mg/dL   Calcium 8.3 (L) 8.9 - 10.3 mg/dL   Total Protein 6.4 (L) 6.5 - 8.1 g/dL   Albumin  2.8 (L) 3.5 - 5.0 g/dL   AST 30 15 - 41 U/L   ALT 18 0 - 44 U/L   Alkaline Phosphatase 257 (H) 38 - 126 U/L   Total Bilirubin 0.5 0.0 - 1.2 mg/dL   GFR, Estimated 15 (L) >60 mL/min   Anion gap 20 (H) 5 - 15  CBC with Differential     Status: Abnormal   Collection Time: 12/25/24  9:22 AM  Result Value Ref Range   WBC 12.6 (H) 4.0 - 10.5 K/uL   RBC 4.49 4.22 - 5.81 MIL/uL   Hemoglobin 12.8 (L) 13.0 - 17.0 g/dL   HCT 60.2 60.9 - 47.9 %   MCV 88.4 80.0 - 100.0 fL   MCH 28.5 26.0 - 34.0 pg   MCHC 32.2 30.0 - 36.0 g/dL   RDW 85.3 88.4 - 84.4 %   Platelets 392 150 - 400 K/uL   nRBC 0.2 0.0 - 0.2 %   Neutrophils Relative % 97 %   Neutro Abs 12.1 (H) 1.7 - 7.7 K/uL   Lymphocytes Relative 2 %   Lymphs Abs 0.3 (L) 0.7 - 4.0 K/uL   Monocytes Relative 0 %   Monocytes Absolute 0.1 0.1 - 1.0 K/uL   Eosinophils Relative 0 %   Eosinophils Absolute 0.1 0.0 - 0.5 K/uL   Basophils Relative 0 %   Basophils Absolute 0.0 0.0 - 0.1 K/uL   WBC Morphology See Note    Smear Review Normal platelet morphology    Immature Granulocytes 1 %   Abs Immature Granulocytes 0.10 (H) 0.00 - 0.07 K/uL   Ovalocytes PRESENT   Pro Brain natriuretic peptide     Status: Abnormal   Collection Time:  12/25/24  9:22 AM  Result Value Ref Range   Pro Brain Natriuretic Peptide 2,105.0 (H) <300.0 pg/mL  Magnesium      Status: None   Collection Time: 12/25/24  9:22 AM  Result Value Ref Range   Magnesium  2.1 1.7 - 2.4 mg/dL  Protime-INR     Status: Abnormal   Collection Time: 12/25/24  9:22 AM  Result Value Ref Range   Prothrombin Time 16.5 (H) 11.4 - 15.2 seconds   INR 1.3 (H) 0.8 - 1.2  POC CBG, ED     Status: None   Collection Time: 12/25/24  9:26 AM  Result Value Ref Range    Glucose-Capillary 78 70 - 99 mg/dL  Troponin T, High Sensitivity     Status: Abnormal   Collection Time: 12/25/24  9:36 AM  Result Value Ref Range   Troponin T High Sensitivity 46 (H) 0 - 19 ng/L  Urinalysis, w/ Reflex to Culture (Infection Suspected) -Urine, Clean Catch     Status: Abnormal   Collection Time: 12/25/24 10:04 AM  Result Value Ref Range   Specimen Source URINE, CLEAN CATCH    Color, Urine YELLOW YELLOW   APPearance TURBID (A) CLEAR   Specific Gravity, Urine 1.012 1.005 - 1.030   pH 7.0 5.0 - 8.0   Glucose, UA NEGATIVE NEGATIVE mg/dL   Hgb urine dipstick SMALL (A) NEGATIVE   Bilirubin Urine NEGATIVE NEGATIVE   Ketones, ur NEGATIVE NEGATIVE mg/dL   Protein, ur 899 (A) NEGATIVE mg/dL   Nitrite NEGATIVE NEGATIVE   Leukocytes,Ua MODERATE (A) NEGATIVE   RBC / HPF 0-5 0 - 5 RBC/hpf   WBC, UA 0-5 0 - 5 WBC/hpf   Bacteria, UA NONE SEEN NONE SEEN   Squamous Epithelial / HPF 0-5 0 - 5 /HPF  Blood gas, venous ([facility], [facility], [facility])     Status: Abnormal   Collection Time: 12/25/24 10:08 AM  Result Value Ref Range   pH, Ven 7.43 7.25 - 7.43   pCO2, Ven 34 (L) 44 - 60 mmHg   pO2, Ven 50 (H) 32 - 45 mmHg   Bicarbonate 22.0 20.0 - 28.0 mmol/L   Acid-base deficit 0.9 0.0 - 2.0 mmol/L   O2 Saturation 76.3 %   Patient temperature 39.1    Collection site BLOOD RIGHT ARM    Drawn by [id number]   Lactic acid, plasma     Status: Abnormal   Collection Time: 12/25/24 11:15 AM  Result Value Ref Range   Lactic Acid, Venous 2.3 (HH) 0.5 - 1.9 mmol/L  Urinalysis, Routine w reflex microscopic -Urine, Catheterized     Status: Abnormal   Collection Time: 12/25/24  3:27 PM  Result Value Ref Range   Color, Urine YELLOW YELLOW   APPearance TURBID (A) CLEAR   Specific Gravity, Urine 1.018 1.005 - 1.030   pH 5.0 5.0 - 8.0   Glucose, UA NEGATIVE NEGATIVE mg/dL   Hgb urine dipstick SMALL (A) NEGATIVE   Bilirubin Urine NEGATIVE NEGATIVE   Ketones, ur NEGATIVE NEGATIVE mg/dL   Protein, ur 899 (A) NEGATIVE  mg/dL   Nitrite NEGATIVE NEGATIVE   Leukocytes,Ua MODERATE (A) NEGATIVE   RBC / HPF 0-5 0 - 5 RBC/hpf   WBC, UA >50 0 - 5 WBC/hpf   Bacteria, UA MANY (A) NONE SEEN   Squamous Epithelial / HPF 0-5 0 - 5 /HPF   WBC Clumps PRESENT   Glucose, capillary     Status: None   Collection Time: 12/25/24  5:09 PM  Result  Value Ref Range   Glucose-Capillary 90 70 - 99 mg/dL   No results found for this or any previous visit (from the past 240 hours).  Renal Function: Recent Labs    12/19/24 0411 12/25/24 0922  CREATININE 1.59* 3.90*   Estimated Creatinine Clearance: 17.9 mL/min (A) (by C-G formula based on SCr of 3.9 mg/dL (H)).  Radiologic Imaging: DG Abdomen 1 View Result Date: 12/25/2024 EXAM: 1 VIEW XRAY OF THE ABDOMEN 12/25/2024 03:36:11 PM COMPARISON: None available. CLINICAL HISTORY: s/p central line FINDINGS: LINES, TUBES AND DEVICES: Right femoral central venous catheter in place with tip overlying the expected region of the IVC. Foley catheter in place overlying the pelvis. BOWEL: Few mildly dilated loops of small bowel in the left hemiabdomen consistent with possible ileus or early small bowel obstruction. SOFT TISSUES: No abnormal calcifications. BONES: Partially visualized left femoral neck fixation screw noted (comparison pelvis x-ray 02/09/2023). Postsurgical changes of the lower lumbar spine. No acute fracture. JOINTS: Bilateral hip osteoarthritis. DISCS/DEGENERATIVE CHANGES: Multilevel degenerative changes of the spine. IMPRESSION: 1. Right femoral central venous catheter with the tip overlying the expected region of the IVC. 2. Few mildly dilated loops of small bowel in the left hemiabdomen, which may reflect ileus or early small bowel obstruction. Electronically signed by: Greig Pique MD 12/25/2024 05:38 PM EST RP Workstation: [id number]   CT CHEST ABDOMEN PELVIS WO CONTRAST Result Date: 12/25/2024 EXAM: CT CHEST, ABDOMEN AND PELVIS WITHOUT CONTRAST 12/25/2024 12:06:43 PM  TECHNIQUE: CT of the chest, abdomen and pelvis was performed without the administration of intravenous contrast. Multiplanar reformatted images are provided for review. Automated exposure control, iterative reconstruction, and/or weight based adjustment of the mA/kV was utilized to reduce the radiation dose to as low as reasonably achievable. COMPARISON: 07/05/2023. CLINICAL HISTORY: Sepsis. FINDINGS: CHEST: MEDIASTINUM AND LYMPH NODES: Heart and pericardium are unremarkable. Calcifications within the left anterior descending and right coronary arteries. Aortic calcifications. The central airways are clear. No mediastinal, hilar or axillary lymphadenopathy. LUNGS AND PLEURA: Small bilateral pleural effusions. Bibasilar atelectasis. No focal consolidation or pulmonary edema. No pneumothorax. ABDOMEN AND PELVIS: LIVER: The liver is unremarkable. GALLBLADDER AND BILE DUCTS: Gallbladder moderately distended. No visible stones or inflammatory changes. This could be further evaluated with right upper quadrant ultrasound if felt clinically indicated. No biliary ductal dilatation. SPLEEN: No acute abnormality. PANCREAS: No acute abnormality. ADRENAL GLANDS: No acute abnormality. KIDNEYS, URETERS AND BLADDER: Moderate right hydronephrosis due to 4 mm distal right ureteral stone. Foley catheter in the bladder, which is decompressed. No stones in the left kidney or ureter. No perinephric or periureteral stranding. GI AND BOWEL: Stomach demonstrates no acute abnormality. Scattered colonic diverticulosis. No active diverticulitis. There is no bowel obstruction. REPRODUCTIVE ORGANS: No acute abnormality. PERITONEUM AND RETROPERITONEUM: No ascites. No free air. VASCULATURE: Aorta is normal in caliber. Aortic atherosclerosis. ABDOMINAL AND PELVIS LYMPH NODES: No lymphadenopathy. BONES AND SOFT TISSUES: Postoperative changes in the thoracic spine, lumbar spine and left hip. T9 fracture again noted as seen on prior CT and MRI. No  focal soft tissue abnormality. IMPRESSION: 1. Moderate right hydronephrosis due to a 4 mm distal right ureteral stone. 2. Moderately distended gallbladder without visible stones or inflammatory changes; consider right upper quadrant ultrasound if indicated. 3. Small bilateral pleural effusions and bibasilar atelectasis. Electronically signed by: Franky Crease MD 12/25/2024 01:47 PM EST RP Workstation: [id number]   CT Head Wo Contrast Result Date: 12/25/2024 EXAM: CT HEAD WITHOUT CONTRAST 12/25/2024 12:06:43 PM TECHNIQUE: CT of the head was performed without  the administration of intravenous contrast. Automated exposure control, iterative reconstruction, and/or weight based adjustment of the mA/kV was utilized to reduce the radiation dose to as low as reasonably achievable. COMPARISON: Head CT 12/09/2024 and MRI 07/19/2021. CLINICAL HISTORY: Mental status change, unknown cause. FINDINGS: BRAIN AND VENTRICLES: There is no evidence of an acute infarct, intracranial hemorrhage, mass, midline shift, hydrocephalus, or extra-axial fluid collection. There is mild cerebral atrophy. Patchy hypodensities in the cerebral white matter bilaterally are similar to the prior CT and nonspecific but compatible with moderate chronic small vessel ischemic disease. 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 Calcified atherosclerosis at the skull base. ORBITS: Bilateral cataract extraction. SINUSES: No acute abnormality. SOFT TISSUES AND SKULL: No acute soft tissue abnormality. No skull fracture. IMPRESSION: 1. No acute intracranial abnormality. 2. Moderate chronic small vessel ischemic disease. Electronically signed by: Dasie Hamburg MD 12/25/2024 12:28 PM EST RP Workstation: [id number]   DG Chest Port 1 View Result Date: 12/25/2024 EXAM: 1 VIEW(S) XRAY OF THE CHEST 12/25/2024 09:33:00 AM COMPARISON: Chest x-ray 02/09/2023. CLINICAL HISTORY: Questionable sepsis -  evaluate for abnormality. FINDINGS: LUNGS AND PLEURA: Low lung volumes accentuate the pulmonary vasculature. No focal pulmonary opacity. No pleural effusion. No pneumothorax. HEART AND MEDIASTINUM: Low lung volumes accentuate the cardiomediastinal silhouette. No acute abnormality of the cardiac and mediastinal silhouettes. BONES AND SOFT TISSUES: Mid/lower thoracic spinal fusion hardware. IMPRESSION: 1. No acute cardiopulmonary findings. Electronically signed by: Morgane Naveau MD 12/25/2024 11:44 AM EST RP Workstation: [id number]    I independently reviewed the above imaging studies.  Impression/Recommendation Patient is a 78 yo male with PMH as above who was transferred from [facility] ED with a 4 mm obstructing right ureteral stone, evidence of a UTI and sepsis.   We discussed the indications for acute intervention including infected obstruction, bilateral ureteral obstruction or unilateral obstruction of solitary kidney as well as other less urgent indications for decompression which would included intractable pain, N/V, and acute renal injury. We also discussed the possible inability to place a ureteral stent in which case, the next intervention recommended would be a percutaneous nephrostomy tube. Given infection is present, plan for emergent right ureteral stent placement.  Recommendations: - Plan for right ureteral stent placement - Keep patient NPO, continue IVF  - Consent to be obtained from patient - Send urine culture at this time, prior to OR - Agree with broad spectrum antibiotics (received cefepime  and vancomycin  in ED) - Continue Foley for maximal urinary decompression   Maurilio Agar 12/25/2024, 6:16 PM        [1]  No current facility-administered medications on file prior to encounter.   Current Outpatient Medications on File Prior to Encounter  Medication Sig Dispense Refill   acetaminophen  (TYLENOL ) 500 MG tablet Take 500 mg by mouth every 8 (eight) hours as needed (pain  level 1-3/ headache/fever).     carvedilol  (COREG ) 6.25 MG tablet Take 1 tablet (6.25 mg total) by mouth 2 (two) times daily.     dorzolamide-timolol (COSOPT) 2-0.5 % ophthalmic solution Place 1 drop into both eyes 2 (two) times daily.     DULoxetine  (CYMBALTA ) 60 MG capsule Take 1 capsule (60 mg total) by mouth 2 (two) times daily. 60 capsule 3   latanoprost (XALATAN) 0.005 % ophthalmic solution Place 1 drop into both eyes at bedtime.     methocarbamol  (ROBAXIN ) 500 MG tablet Take 1 tablet (500 mg total) by mouth 3 (three) times daily.  Multiple Vitamin (MULTIVITAMIN) tablet Take 1 tablet by mouth daily.     Multiple Vitamins-Minerals (PRESERVISION AREDS PO) Take 1 capsule by mouth in the morning and at bedtime.     Omega-3 Fatty Acids (FISH OIL) 1000 MG CAPS Take 1 capsule by mouth daily.     oxyCODONE  10 MG TABS Take 1 tablet (10 mg total) by mouth every 6 (six) hours as needed. 20 tablet 0   pravastatin  (PRAVACHOL ) 10 MG tablet Take 10 mg by mouth at bedtime.     Protein (PROSOURCE PO) Take 30 mLs by mouth 3 (three) times daily.     valsartan  (DIOVAN ) 320 MG tablet Take 320 mg by mouth daily.    [2]  Allergies Allergen Reactions   Poison Oak Extract Hives and Itching   Sulfa Antibiotics Other (See Comments)    Unknown    Sulfonamide Derivatives Itching and Rash

## 2024-12-25 NOTE — Transfer of Care (Signed)
 Immediate Anesthesia Transfer of Care Note  Patient: Zachary [name]  Procedure(s) Performed: CYSTOSCOPY, WITH RETROGRADE PYELOGRAM AND URETERAL STENT INSERTION (Right)  Patient Location: ICU  Anesthesia Type:General  Level of Consciousness: awake, alert , and oriented  Airway & Oxygen  Therapy: Patient Spontanous Breathing and Patient connected to nasal cannula oxygen   Post-op Assessment: Report given to RN and Post -op Vital signs reviewed and stable  Post vital signs: Reviewed and stable  Last Vitals:  Vitals Value Taken Time  BP 113/72 12/25/24 21:15  Temp    Pulse 82 12/25/24 21:20  Resp 17 12/25/24 21:20  SpO2 100 % 12/25/24 21:20  Vitals shown include unfiled device data.  Last Pain:  Vitals:   12/25/24 2000  TempSrc: Axillary  PainSc:          Complications: No notable events documented.

## 2024-12-25 NOTE — ED Provider Notes (Signed)
 " [place] EMERGENCY DEPARTMENT AT [facility] Provider Note   CSN: [id number] Arrival date & time: 12/25/24  9083     Patient presents with: Altered Mental Status   Zachary [name] is a 78 y.o. male.    Altered Mental Status Patient presents for altered mental status.  Medical history includes HTN, HLD, prediabetes, OSA, anxiety, depression.  He was admitted 2 weeks ago for T9 vertebral fracture from a fall at home.  He underwent surgery on 12/17 with Dr. Onetha.  He was discharged to rehab facility 6 days ago.  At baseline, he is ambulatory and conversant.  He arrives via EMS from [facility] for concern of altered mental status.  Per staff at facility, last known well was 7:00.  He had an acute change in mental status as well as an acute drop in blood pressure.  EMS noted fever, hypotension, tachycardia, hypoxia.  CBG was normal prior to arrival.  EMS placed him on 15 L of supplemental oxygen  by nonrebreather.  He was given IV fluids and Rocephin  prior to arrival.       Prior to Admission medications  Medication Sig Start Date End Date Taking? Authorizing Provider  acetaminophen  (TYLENOL ) 500 MG tablet Take 500 mg by mouth every 8 (eight) hours as needed (pain level 1-3/ headache/fever).   Yes [provider]  carvedilol  (COREG ) 6.25 MG tablet Take 1 tablet (6.25 mg total) by mouth 2 (two) times daily. 12/19/24  Yes Al-Sultani, Anmar, MD  dorzolamide-timolol (COSOPT) 2-0.5 % ophthalmic solution Place 1 drop into both eyes 2 (two) times daily. 01/02/23  Yes [provider]  DULoxetine  (CYMBALTA ) 60 MG capsule Take 1 capsule (60 mg total) by mouth 2 (two) times daily. 11/15/24  Yes Okey Barnie SAUNDERS, MD  latanoprost (XALATAN) 0.005 % ophthalmic solution Place 1 drop into both eyes at bedtime.   Yes [provider]  methocarbamol  (ROBAXIN ) 500 MG tablet Take 1 tablet (500 mg total) by mouth 3 (three) times daily. 12/19/24  Yes Al-Sultani, Anmar, MD  Multiple  Vitamin (MULTIVITAMIN) tablet Take 1 tablet by mouth daily.   Yes [provider]  Multiple Vitamins-Minerals (PRESERVISION AREDS PO) Take 1 capsule by mouth in the morning and at bedtime.   Yes [provider]  Omega-3 Fatty Acids (FISH OIL) 1000 MG CAPS Take 1 capsule by mouth daily.   Yes [provider]  oxyCODONE  10 MG TABS Take 1 tablet (10 mg total) by mouth every 6 (six) hours as needed. 12/18/24 12/18/25 Yes Swayze, Ava, DO  pravastatin  (PRAVACHOL ) 10 MG tablet Take 10 mg by mouth at bedtime. 01/23/20  Yes [provider]  Protein (PROSOURCE PO) Take 30 mLs by mouth 3 (three) times daily.   Yes [provider]  valsartan  (DIOVAN ) 320 MG tablet Take 320 mg by mouth daily.   Yes [provider]    Allergies: Poison oak extract, Sulfa antibiotics, and Sulfonamide derivatives    Review of Systems  Unable to perform ROS: Mental status change    Updated Vital Signs BP 110/76   Pulse (!) 101   Temp 99.7 F (37.6 C)   Resp (!) 21   SpO2 94%   Physical Exam Vitals and nursing note reviewed.  Constitutional:      General: He is not in acute distress.    Appearance: He is well-developed and normal weight. He is ill-appearing. He is not toxic-appearing or diaphoretic.  HENT:     Head: Normocephalic and atraumatic.  Right Ear: External ear normal.     Left Ear: External ear normal.     Nose: Nose normal.     Mouth/Throat:     Mouth: Mucous membranes are moist.  Eyes:     Conjunctiva/sclera: Conjunctivae normal.  Cardiovascular:     Rate and Rhythm: Regular rhythm. Tachycardia present.     Heart sounds: No murmur heard. Pulmonary:     Effort: Tachypnea present. No respiratory distress.     Breath sounds: Decreased breath sounds present.  Abdominal:     General: There is no distension.     Palpations: Abdomen is soft.     Tenderness: There is abdominal tenderness.  Musculoskeletal:        General: No swelling or  deformity.     Cervical back: Neck supple.  Skin:    General: Skin is warm and dry.     Coloration: Skin is not jaundiced or pale.  Neurological:     GCS: GCS eye subscore is 4. GCS verbal subscore is 4. GCS motor subscore is 6.     (all labs ordered are listed, but only abnormal results are displayed) Labs Reviewed  LACTIC ACID, PLASMA - Abnormal; Notable for the following components:      Result Value   Lactic Acid, Venous 2.7 (*)    All other components within normal limits  LACTIC ACID, PLASMA - Abnormal; Notable for the following components:   Lactic Acid, Venous 2.3 (*)    All other components within normal limits  COMPREHENSIVE METABOLIC PANEL WITH GFR - Abnormal; Notable for the following components:   CO2 16 (*)    BUN 89 (*)    Creatinine, Ser 3.90 (*)    Calcium 8.3 (*)    Total Protein 6.4 (*)    Albumin  2.8 (*)    Alkaline Phosphatase 257 (*)    GFR, Estimated 15 (*)    Anion gap 20 (*)    All other components within normal limits  CBC WITH DIFFERENTIAL/PLATELET - Abnormal; Notable for the following components:   WBC 12.6 (*)    Hemoglobin 12.8 (*)    Neutro Abs 12.1 (*)    Lymphs Abs 0.3 (*)    Abs Immature Granulocytes 0.10 (*)    All other components within normal limits  PRO BRAIN NATRIURETIC PEPTIDE - Abnormal; Notable for the following components:   Pro Brain Natriuretic Peptide 2,105.0 (*)    All other components within normal limits  BLOOD GAS, VENOUS - Abnormal; Notable for the following components:   pCO2, Ven 34 (*)    pO2, Ven 50 (*)    All other components within normal limits  URINALYSIS, W/ REFLEX TO CULTURE (INFECTION SUSPECTED) - Abnormal; Notable for the following components:   APPearance TURBID (*)    Hgb urine dipstick SMALL (*)    Protein, ur 100 (*)    Leukocytes,Ua MODERATE (*)    All other components within normal limits  PROTIME-INR - Abnormal; Notable for the following components:   Prothrombin Time 16.5 (*)    INR 1.3 (*)     All other components within normal limits  URINALYSIS, ROUTINE W REFLEX MICROSCOPIC - Abnormal; Notable for the following components:   APPearance TURBID (*)    Hgb urine dipstick SMALL (*)    Protein, ur 100 (*)    Leukocytes,Ua MODERATE (*)    Bacteria, UA MANY (*)    All other components within normal limits  TROPONIN T, HIGH SENSITIVITY - Abnormal; Notable for  the following components:   Troponin T High Sensitivity 46 (*)    All other components within normal limits  CULTURE, BLOOD (ROUTINE X 2)  CULTURE, BLOOD (ROUTINE X 2)  URINE CULTURE  MAGNESIUM   CBG MONITORING, ED  I-STAT CHEM 8, ED    EKG: EKG Interpretation Date/Time:  Monday December 25 2024 09:21:24 EST Ventricular Rate:  114 PR Interval:  140 QRS Duration:  140 QT Interval:  330 QTC Calculation: 455 R Axis:   70  Text Interpretation: Sinus tachycardia Atrial premature complexes Aberrant conduction of SV complex(es) Right bundle branch block Confirmed by Melvenia Motto ([id number]) on 12/25/2024 9:35:42 AM  Radiology: CT CHEST ABDOMEN PELVIS WO CONTRAST Result Date: 12/25/2024 EXAM: CT CHEST, ABDOMEN AND PELVIS WITHOUT CONTRAST 12/25/2024 12:06:43 PM TECHNIQUE: CT of the chest, abdomen and pelvis was performed without the administration of intravenous contrast. Multiplanar reformatted images are provided for review. Automated exposure control, iterative reconstruction, and/or weight based adjustment of the mA/kV was utilized to reduce the radiation dose to as low as reasonably achievable. COMPARISON: 07/05/2023. CLINICAL HISTORY: Sepsis. FINDINGS: CHEST: MEDIASTINUM AND LYMPH NODES: Heart and pericardium are unremarkable. Calcifications within the left anterior descending and right coronary arteries. Aortic calcifications. The central airways are clear. No mediastinal, hilar or axillary lymphadenopathy. LUNGS AND PLEURA: Small bilateral pleural effusions. Bibasilar atelectasis. No focal consolidation or pulmonary edema. No  pneumothorax. ABDOMEN AND PELVIS: LIVER: The liver is unremarkable. GALLBLADDER AND BILE DUCTS: Gallbladder moderately distended. No visible stones or inflammatory changes. This could be further evaluated with right upper quadrant ultrasound if felt clinically indicated. No biliary ductal dilatation. SPLEEN: No acute abnormality. PANCREAS: No acute abnormality. ADRENAL GLANDS: No acute abnormality. KIDNEYS, URETERS AND BLADDER: Moderate right hydronephrosis due to 4 mm distal right ureteral stone. Foley catheter in the bladder, which is decompressed. No stones in the left kidney or ureter. No perinephric or periureteral stranding. GI AND BOWEL: Stomach demonstrates no acute abnormality. Scattered colonic diverticulosis. No active diverticulitis. There is no bowel obstruction. REPRODUCTIVE ORGANS: No acute abnormality. PERITONEUM AND RETROPERITONEUM: No ascites. No free air. VASCULATURE: Aorta is normal in caliber. Aortic atherosclerosis. ABDOMINAL AND PELVIS LYMPH NODES: No lymphadenopathy. BONES AND SOFT TISSUES: Postoperative changes in the thoracic spine, lumbar spine and left hip. T9 fracture again noted as seen on prior CT and MRI. No focal soft tissue abnormality. IMPRESSION: 1. Moderate right hydronephrosis due to a 4 mm distal right ureteral stone. 2. Moderately distended gallbladder without visible stones or inflammatory changes; consider right upper quadrant ultrasound if indicated. 3. Small bilateral pleural effusions and bibasilar atelectasis. Electronically signed by: Franky Crease MD 12/25/2024 01:47 PM EST RP Workstation: [id number]   CT Head Wo Contrast Result Date: 12/25/2024 EXAM: CT HEAD WITHOUT CONTRAST 12/25/2024 12:06:43 PM TECHNIQUE: CT of the head was performed without the administration of intravenous contrast. Automated exposure control, iterative reconstruction, and/or weight based adjustment of the mA/kV was utilized to reduce the radiation dose to as low as reasonably achievable.  COMPARISON: Head CT 12/09/2024 and MRI 07/19/2021. CLINICAL HISTORY: Mental status change, unknown cause. FINDINGS: BRAIN AND VENTRICLES: There is no evidence of an acute infarct, intracranial hemorrhage, mass, midline shift, hydrocephalus, or extra-axial fluid collection. There is mild cerebral atrophy. Patchy hypodensities in the cerebral white matter bilaterally are similar to the prior CT and nonspecific but compatible with moderate chronic small vessel ischemic disease. 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 Calcified atherosclerosis at the skull base.  ORBITS: Bilateral cataract extraction. SINUSES: No acute abnormality. SOFT TISSUES AND SKULL: No acute soft tissue abnormality. No skull fracture. IMPRESSION: 1. No acute intracranial abnormality. 2. Moderate chronic small vessel ischemic disease. Electronically signed by: Dasie Hamburg MD 12/25/2024 12:28 PM EST RP Workstation: [id number]   DG Chest Port 1 View Result Date: 12/25/2024 EXAM: 1 VIEW(S) XRAY OF THE CHEST 12/25/2024 09:33:00 AM COMPARISON: Chest x-ray 02/09/2023. CLINICAL HISTORY: Questionable sepsis - evaluate for abnormality. FINDINGS: LUNGS AND PLEURA: Low lung volumes accentuate the pulmonary vasculature. No focal pulmonary opacity. No pleural effusion. No pneumothorax. HEART AND MEDIASTINUM: Low lung volumes accentuate the cardiomediastinal silhouette. No acute abnormality of the cardiac and mediastinal silhouettes. BONES AND SOFT TISSUES: Mid/lower thoracic spinal fusion hardware. IMPRESSION: 1. No acute cardiopulmonary findings. Electronically signed by: Morgane Naveau MD 12/25/2024 11:44 AM EST RP Workstation: [id number]     Central Line  Performed by: Melvenia Motto, MD Authorized by: Melvenia Motto, MD   Consent:    Consent obtained:  Verbal   Consent given by:  Spouse   Risks, benefits, and alternatives were discussed: yes     Risks discussed:  Incorrect  placement and bleeding   Alternatives discussed:  No treatment and delayed treatment Universal protocol:    Procedure explained and questions answered to patient or proxy's satisfaction: yes     Patient identity confirmed:  Arm band Pre-procedure details:    Indication(s): central venous access     Hand hygiene: Hand hygiene performed prior to insertion     Sterile barrier technique: All elements of maximal sterile technique followed     Skin preparation:  Chlorhexidine    Skin preparation agent: Skin preparation agent completely dried prior to procedure   Sedation:    Sedation type:  None Anesthesia:    Anesthesia method:  Local infiltration   Local anesthetic:  5 mL lidocaine  (PF) 1 % Procedure details:    Location:  R femoral   Patient position:  Supine   Procedural supplies:  Triple lumen   Catheter size:  7 Fr   Landmarks identified: yes     Ultrasound guidance: yes     Ultrasound guidance timing: real time     Sterile ultrasound techniques: Sterile gel and sterile probe covers were used     Number of attempts:  1   Successful placement: yes   Post-procedure details:    Post-procedure:  Line sutured and dressing applied   Assessment:  Free fluid flow   Procedure completion:  Tolerated    Medications Ordered in the ED  norepinephrine  (LEVOPHED ) 4mg  in (0.016 mg/mL) premix infusion (25 mcg/min Intravenous New Bag/Given 12/25/24 1406)  acetaminophen  (TYLENOL ) suppository 650 mg (650 mg Rectal Given 12/25/24 1017)  lactated ringers  bolus 500 mL (0 mLs Intravenous Stopped 12/25/24 1100)  ceFEPIme  (MAXIPIME ) 2 g in sodium chloride  0.9 % 100 mL IVPB (0 g Intravenous Stopped 12/25/24 1116)  metroNIDAZOLE  (FLAGYL ) IVPB 500 mg (0 mg Intravenous Stopped 12/25/24 1220)  vancomycin  (VANCOREADY) IVPB 2000 mg/400 mL (0 mg Intravenous Stopped 12/25/24 1200)  lactated ringers  bolus 1,000 mL (0 mLs Intravenous Stopped 12/25/24 1230)  lactated ringers  bolus 500 mL (0 mLs Intravenous  Stopped 12/25/24 1435)  fentaNYL  (SUBLIMAZE ) injection 50 mcg (50 mcg Intravenous Given 12/25/24 1444)                                    Medical Decision Making Amount and/or Complexity of  Data Reviewed Labs: ordered. Radiology: ordered.  Risk OTC drugs. Prescription drug management. Decision regarding hospitalization.   This patient presents to the ED for concern of altered mental status, this involves an extensive number of treatment options, and is a complaint that carries with it a high risk of complications and morbidity.  The differential diagnosis includes sepsis, CVA, seizure, polypharmacy, metabolic derangements   Co morbidities / Chronic conditions that complicate the patient evaluation  HTN, HLD, prediabetes, OSA, anxiety, depression   Additional history obtained:  Additional history obtained from EMR External records from outside source obtained and reviewed including EMS, patient's wife   Lab Tests:  I Ordered, and personally interpreted labs.  The pertinent results include: Leukocytosis and lactic acidosis consistent with sepsis; AKI is present.   Imaging Studies ordered:  I ordered imaging studies including chest x-ray, CT of head, chest, abdomen, pelvis I independently visualized and interpreted imaging which showed 4 mm obstructing right ureteral stone; moderately distended gallbladder without visible stones or inflammatory changes; small bilateral pleural effusions I agree with the radiologist interpretation   Cardiac Monitoring: / EKG:  The patient was maintained on a cardiac monitor.  I personally viewed and interpreted the cardiac monitored which showed an underlying rhythm of: Sinus rhythm   Problem List / ED Course / Critical interventions / Medication management  Patient presenting for altered mental status.  Onset was reportedly this morning.  On arrival in the ED, he is awake.  His verbal responses are limited.  He is able to say his name.   He is able to follow commands and he is moving all extremities.  He is currently tachypneic and tachycardic.  His blood pressure has improved since EMS arrival on scene.  Septic workup was initiated.  Patient was given 1 g of ceftriaxone  prior to arrival.  Additional antibiotics were ordered.  On bladder scan, patient had over 1 L of urine in bladder despite Foley catheter placed.  Foley catheter was exchanged with large volume of purulent urine removed.  Patient's wife arrived and was able to provide further history.  She noted a change in his mentation 3 days ago.  This has progressed.  Yesterday, he was not responding to her.  This progression does fit with worsening infection.  Patient lab work notable for leukocytosis and lactic acidosis consistent with sepsis.  AKI is present as well.  He did require initiation of Levophed .  After fever defervesced since and improved blood pressure, patient mentation has improved.  He denies any current complaints.  He was able to wean down to 6 L of supplemental oxygen .  Although his microscopic urinalysis did not show evidence of infection, urine source is strongly suspected based on gross appearance, as shown below:  A repeat urine sample was sent for analysis, however, lab stated that it was too thick to run testing on.  Ultimately, repeat urinalysis does show clear evidence of infection.  I spoke with intensivist on-call, Dr. Pawar, who accepted the patient in admission.  CT imaging did show a right ureteral obstructing stone.  I spoke with urology team, Cam Sattenfield, who stated that he preferred [facility] long admission.   Urology did request a call once he does arrive in [place]. Dr. Theodoro was informed of these requests.  Right femoral central line was placed.  Patient to remain in the ED pending transport to ICU. I ordered medication including IV fluids and broad-spectrum antibiotics for treatment of sepsis; Tylenol  for antipyresis; fentanyl  for analgesia;  Levophed   for hypotension Reevaluation of the patient after these medicines showed that the patient improved I have reviewed the patients home medicines and have made adjustments as needed   Consultations Obtained:  I requested consultation with the intensivist, Dr. Theodoro,  and discussed lab and imaging findings as well as pertinent plan - they recommend: Admission to ICU I requested consultation with the urology team, Cam Sattenfield, and discussed lab and imaging findings as well as pertinent plan - they recommend: Call to urology once he arrives at ICU.  [facility] admission is preferred.   Social Determinants of Health:  Currently residing in a rehab facility  CRITICAL CARE Performed by: Bernardino Fireman   Total critical care time: 34 minutes  Critical care time was exclusive of separately billable procedures and treating other patients.  Critical care was necessary to treat or prevent imminent or life-threatening deterioration.  Critical care was time spent personally by me on the following activities: development of treatment plan with patient and/or surrogate as well as nursing, discussions with consultants, evaluation of patient's response to treatment, examination of patient, obtaining history from patient or surrogate, ordering and performing treatments and interventions, ordering and review of laboratory studies, ordering and review of radiographic studies, pulse oximetry and re-evaluation of patient's condition.       Final diagnoses:  Septic shock (HCC)  Urinary tract infection with hematuria, site unspecified  Nephrolithiasis    ED Discharge Orders     None          Fireman Bernardino, MD 12/25/24 1611  "

## 2024-12-25 NOTE — Anesthesia Preprocedure Evaluation (Addendum)
"                                    Anesthesia Evaluation  Patient identified by MRN, date of birth, ID band Patient confused    Reviewed: Unable to perform ROS - Chart review onlyPreop documentation limited or incomplete due to emergent nature of procedure.  History of Anesthesia Complications (+) history of anesthetic complications  Airway Mallampati: II       Dental  (+) Teeth Intact   Pulmonary sleep apnea , former smoker   breath sounds clear to auscultation       Cardiovascular hypertension, Pt. on home beta blockers and Pt. on medications  Rhythm:Regular Rate:Normal     Neuro/Psych  Headaches PSYCHIATRIC DISORDERS Anxiety Depression       GI/Hepatic negative GI ROS, Neg liver ROS,,,  Endo/Other   Hyperthyroidism   Renal/GU Renal disease     Musculoskeletal   Abdominal   Peds  Hematology  (+) Blood dyscrasia, anemia   Anesthesia Other Findings   Reproductive/Obstetrics                              Anesthesia Physical Anesthesia Plan  ASA: 4 and emergent  Anesthesia Plan: General   Post-op Pain Management: Tylenol  PO (pre-op)*   Induction: Intravenous  PONV Risk Score and Plan: 3 and Ondansetron  and Treatment may vary due to age or medical condition  Airway Management Planned: LMA and Oral ETT  Additional Equipment:   Intra-op Plan:   Post-operative Plan: Extubation in OR  Informed Consent:    Patient has DNR.   Only emergency history available and History available from chart only  Plan Discussed with: CRNA  Anesthesia Plan Comments:          Anesthesia Quick Evaluation  "

## 2024-12-25 NOTE — Op Note (Cosign Needed)
 Preoperative diagnosis:  Right distal ureteral stone with sepsis  Postoperative diagnosis:  Right distal ureteral stone  with sepsis  Procedure:  Cystoscopy right ureteral stent placement right retrograde pyelography with interpretation  Application of fluoroscopy  Surgeon: Norleen Seltzer, MD  Resident: Maurilio Agar, MD   Anesthesia: General  Drains: 6x26 right JJ stent and 6fr foley.  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 patient with a distal right ureteral stone and urosepsis. After reviewing the management options for treatment, he elected to proceed with the above surgical procedure(s). We have discussed the potential benefits and risks of the procedure, side effects of the proposed treatment, the likelihood of the patient achieving the goals of the procedure, and any potential problems that might occur during the procedure or recuperation. Informed consent has been obtained.  Description of procedure:  The patient was taken to the operating room and general anesthesia was induced.  The patient was placed in the dorsal lithotomy position, prepped and draped in the usual sterile fashion, and preoperative antibiotics were confirmed. A preoperative time-out was performed.   Cystourethroscopy was performed.  The patients urethra and bladder were examined with findings as above.    Attention then turned to the rightureteral orifice and a ureteral catheter was used to intubate the ureteral orifice.   Omnipaque  contrast was injected through the ureteral catheter and a retrograde pyelogram was performed with findings as dictated above.  A 0.38 sensor guidewire was then advanced up the right ureter into the renal pelvis under fluoroscopic guidance.  The wire was then backloaded through the cystoscope and a 6 Fr x 26 cm ureteral stent was advance over the wire using Seldinger technique.  The stent was positioned appropriately under fluoroscopic and cystoscopic guidance.  The wire was then removed with an adequate stent curl noted in the renal pelvis as well as in the bladder.  The bladder was then emptied and the procedure ended.  A 16 Fr foley catheter was replaced and 10 cc sterile water  instilled in the balloon. The patient appeared to tolerate the procedure well and without complications.  The patient was able to be awakened and transferred to ICU.   - Message sent to primary team to confirm appropriate scheduled antibiotics - Continue broad spectrum antibiotics until cultures result

## 2024-12-25 NOTE — ED Notes (Signed)
 Report Zachary [name] at [facility]

## 2024-12-25 NOTE — Progress Notes (Signed)
 Critical care attending attestation note:   I agree with the Advanced Practitioner's note, impression, and recommendations as outlined. I have taken an independent interval history, reviewed the chart and examined the patient. The following reflects my medical decision making and independent critical care time    Summary of Assessment and Plan   78 year old male with past medical history of hypertension OSA, anxiety.  He recently had a fall with T9 fracture status post surgery on 12/17.  He was being treated with pain with Vicodin.  He was sent to rehab.  Over rehab he got confused and was found to be hypotensive and was sent to [facility], ER.  At [facility], ER he was started on Levophed  after IV fluid resuscitation and was transferred to [facility], ICU.  Pertinent Physical Exam:  General: Elderly male appears weak but not in respiratory distress Lungs: clear to auscultation bilaterally.  Heart: regular rate rhythm, no murmur appreciated.  Abdomen: Belly nontender.  Bowel sounds normal. Neuro: axox 3..  Moving all extremities.  Labs and Radiology reviewed.  A/P:  Septic shock: Right hydronephrosis: Urosepsis: - CT abdomen with moderate right hydronephrosis and gallbladder distention.  No clinical signs of cholecystitis. - Was given cefepime  Flagyl  and vancomycin  at [facility], ER.  Additionally given Rocephin  via EMS.  Will continue with Rocephin  for urosepsis. - Consult urology for lithotripsy and stent versus need for IR guided nephrostomy tube - Continue Levophed .  No current indication for stress dose steroids.  If Levophed  dose greater than 15 will add stress dose steroids and vasopressin .  AKI on CKD stage IIIb: Urinary retention status post Foley catheter: -AKI because of urinary retention and above.  Will improve with Foley and above interventions. - Urology consult as above.   Elevated proBNP: - POCUS without significant LV dysfunction or RV dysfunction.  IVC without  respiratory variation.  Clinically does not look fluid overloaded. - Will get an official echo.  Rest of the plan per NP note.   CRITICAL CARE Performed by: Sammi JONETTA Fredericks.     Total critical care time: 40 minutes   Critical care time was exclusive of separately billable procedures and treating other patients.   Critical care was necessary to treat or prevent imminent or life-threatening deterioration.   Critical care was time spent personally by me on the following activities: development of treatment plan with patient and/or surrogate as well as nursing, discussions with consultants, evaluation of patient's response to treatment, examination of patient, obtaining history from patient or surrogate, ordering and performing treatments and interventions, ordering and review of laboratory studies, ordering and review of radiographic studies, pulse oximetry, re-evaluation of patient's condition and participation in multidisciplinary rounds.  Sammi JONETTA Fredericks, MD Pulmonary, Critical Care and Sleep Attending.  Pager: [phone]  12/25/2024, 5:40 PM

## 2024-12-25 NOTE — Anesthesia Procedure Notes (Signed)
 Procedure Name: LMA Insertion Date/Time: 12/25/2024 8:34 PM  Performed by: Lockie Flesher, CRNAPre-anesthesia Checklist: Patient identified, Emergency Drugs available, Suction available and Patient being monitored Patient Re-evaluated:Patient Re-evaluated prior to induction Oxygen  Delivery Method: Circle System Utilized Preoxygenation: Pre-oxygenation with 100% oxygen  Induction Type: IV induction Ventilation: Mask ventilation without difficulty LMA: LMA inserted LMA Size: 4.0 Number of attempts: 1 Airway Equipment and Method: Bite block Placement Confirmation: positive ETCO2 Tube secured with: Tape Dental Injury: Teeth and Oropharynx as per pre-operative assessment

## 2024-12-26 ENCOUNTER — Encounter (HOSPITAL_COMMUNITY): Payer: Self-pay | Admitting: Urology

## 2024-12-26 ENCOUNTER — Inpatient Hospital Stay (HOSPITAL_COMMUNITY)

## 2024-12-26 DIAGNOSIS — G8929 Other chronic pain: Secondary | ICD-10-CM | POA: Diagnosis not present

## 2024-12-26 DIAGNOSIS — R509 Fever, unspecified: Secondary | ICD-10-CM

## 2024-12-26 DIAGNOSIS — L89151 Pressure ulcer of sacral region, stage 1: Secondary | ICD-10-CM

## 2024-12-26 DIAGNOSIS — R6521 Severe sepsis with septic shock: Secondary | ICD-10-CM

## 2024-12-26 DIAGNOSIS — N1832 Chronic kidney disease, stage 3b: Secondary | ICD-10-CM | POA: Diagnosis not present

## 2024-12-26 DIAGNOSIS — I129 Hypertensive chronic kidney disease with stage 1 through stage 4 chronic kidney disease, or unspecified chronic kidney disease: Secondary | ICD-10-CM | POA: Diagnosis not present

## 2024-12-26 DIAGNOSIS — N179 Acute kidney failure, unspecified: Secondary | ICD-10-CM | POA: Diagnosis not present

## 2024-12-26 DIAGNOSIS — F418 Other specified anxiety disorders: Secondary | ICD-10-CM | POA: Diagnosis not present

## 2024-12-26 DIAGNOSIS — A419 Sepsis, unspecified organism: Secondary | ICD-10-CM | POA: Diagnosis not present

## 2024-12-26 DIAGNOSIS — E785 Hyperlipidemia, unspecified: Secondary | ICD-10-CM | POA: Diagnosis not present

## 2024-12-26 DIAGNOSIS — E46 Unspecified protein-calorie malnutrition: Secondary | ICD-10-CM | POA: Diagnosis not present

## 2024-12-26 DIAGNOSIS — N1330 Unspecified hydronephrosis: Secondary | ICD-10-CM | POA: Diagnosis not present

## 2024-12-26 DIAGNOSIS — G9341 Metabolic encephalopathy: Secondary | ICD-10-CM | POA: Diagnosis not present

## 2024-12-26 LAB — COMPREHENSIVE METABOLIC PANEL WITH GFR
ALT: 15 U/L (ref 0–44)
AST: 23 U/L (ref 15–41)
Albumin: 2.6 g/dL — ABNORMAL LOW (ref 3.5–5.0)
Alkaline Phosphatase: 171 U/L — ABNORMAL HIGH (ref 38–126)
Anion gap: 13 (ref 5–15)
BUN: 87 mg/dL — ABNORMAL HIGH (ref 8–23)
CO2: 21 mmol/L — ABNORMAL LOW (ref 22–32)
Calcium: 8.1 mg/dL — ABNORMAL LOW (ref 8.9–10.3)
Chloride: 103 mmol/L (ref 98–111)
Creatinine, Ser: 3.76 mg/dL — ABNORMAL HIGH (ref 0.61–1.24)
GFR, Estimated: 16 mL/min — ABNORMAL LOW
Glucose, Bld: 140 mg/dL — ABNORMAL HIGH (ref 70–99)
Potassium: 4.7 mmol/L (ref 3.5–5.1)
Sodium: 137 mmol/L (ref 135–145)
Total Bilirubin: 0.3 mg/dL (ref 0.0–1.2)
Total Protein: 5.9 g/dL — ABNORMAL LOW (ref 6.5–8.1)

## 2024-12-26 LAB — CBC
HCT: 30.5 % — ABNORMAL LOW (ref 39.0–52.0)
Hemoglobin: 9.8 g/dL — ABNORMAL LOW (ref 13.0–17.0)
MCH: 28.7 pg (ref 26.0–34.0)
MCHC: 32.1 g/dL (ref 30.0–36.0)
MCV: 89.2 fL (ref 80.0–100.0)
Platelets: 297 K/uL (ref 150–400)
RBC: 3.42 MIL/uL — ABNORMAL LOW (ref 4.22–5.81)
RDW: 15 % (ref 11.5–15.5)
WBC: 33.4 K/uL — ABNORMAL HIGH (ref 4.0–10.5)
nRBC: 0 % (ref 0.0–0.2)

## 2024-12-26 LAB — PHOSPHORUS: Phosphorus: 5 mg/dL — ABNORMAL HIGH (ref 2.5–4.6)

## 2024-12-26 LAB — ECHOCARDIOGRAM COMPLETE
Area-P 1/2: 3.99 cm2
S' Lateral: 3.6 cm

## 2024-12-26 LAB — GLUCOSE, CAPILLARY
Glucose-Capillary: 100 mg/dL — ABNORMAL HIGH (ref 70–99)
Glucose-Capillary: 124 mg/dL — ABNORMAL HIGH (ref 70–99)
Glucose-Capillary: 132 mg/dL — ABNORMAL HIGH (ref 70–99)
Glucose-Capillary: 139 mg/dL — ABNORMAL HIGH (ref 70–99)

## 2024-12-26 LAB — PROTIME-INR
INR: 1.3 — ABNORMAL HIGH (ref 0.8–1.2)
Prothrombin Time: 17.2 s — ABNORMAL HIGH (ref 11.4–15.2)

## 2024-12-26 LAB — MAGNESIUM: Magnesium: 2 mg/dL (ref 1.7–2.4)

## 2024-12-26 MED ORDER — HALOPERIDOL 0.5 MG PO TABS
0.5000 mg | ORAL_TABLET | Freq: Once | ORAL | Status: AC
  Administered 2024-12-26: 0.5 mg via ORAL
  Filled 2024-12-26: qty 1

## 2024-12-26 NOTE — Progress Notes (Signed)
" °   12/26/24 2231  BiPAP/CPAP/SIPAP  BiPAP/CPAP/SIPAP Pt Type Adult  BiPAP/CPAP/SIPAP Resmed  Patient Home Machine Yes  Safety Check Completed by RT for Home Unit Yes, no issues noted  Patient Home Mask Yes  Patient Home Tubing Yes   Pt placed self on home CPAP machine. "

## 2024-12-26 NOTE — Progress Notes (Signed)
 1 Day Post-Op  Subjective: Lashon is improving s/p stenting for pyonephrosis.  His urine remains turbid but his Cr is down slightly and he has good UOP.   He has a low grade fever.  He reports no additional complaints.   ROS:  Review of Systems  All other systems reviewed and are negative.   Anti-infectives: Anti-infectives (From admission, onward)    Start     Dose/Rate Route Frequency Ordered Stop   12/26/24 0800  cefTRIAXone  (ROCEPHIN ) 2 g in sodium chloride  0.9 % 100 mL IVPB        2 g 200 mL/hr over 30 Minutes Intravenous Every 24 hours 12/25/24 1805     12/25/24 0945  ceFEPIme  (MAXIPIME ) 2 g in sodium chloride  0.9 % 100 mL IVPB        2 g 200 mL/hr over 30 Minutes Intravenous  Once 12/25/24 0935 12/25/24 1116   12/25/24 0945  metroNIDAZOLE  (FLAGYL ) IVPB 500 mg        500 mg 100 mL/hr over 60 Minutes Intravenous  Once 12/25/24 0935 12/25/24 1220   12/25/24 0945  vancomycin  (VANCOCIN ) IVPB 1000 mg/200 mL premix  Status:  Discontinued        1,000 mg 200 mL/hr over 60 Minutes Intravenous  Once 12/25/24 0935 12/25/24 0941   12/25/24 0945  vancomycin  (VANCOREADY) IVPB 2000 mg/400 mL        2,000 mg 200 mL/hr over 120 Minutes Intravenous  Once 12/25/24 0941 12/25/24 1200       Current Facility-Administered Medications  Medication Dose Route Frequency Provider Last Rate Last Admin   acetaminophen  (TYLENOL ) suppository 650 mg  650 mg Rectal Q6H PRN Simpson, Paula B, NP       bisacodyl  (DULCOLAX) suppository 10 mg  10 mg Rectal Daily PRN Simpson, Paula B, NP       cefTRIAXone  (ROCEPHIN ) 2 g in sodium chloride  0.9 % 100 mL IVPB  2 g Intravenous Q24H Simpson, Paula B, NP 200 mL/hr at 12/26/24 0800 Infusion Verify at 12/26/24 0800   Chlorhexidine  Gluconate Cloth 2 % PADS 6 each  6 each Topical Daily Antonetta Moccasin B, NP   6 each at 12/26/24 1031   heparin  injection 5,000 Units  5,000 Units Subcutaneous Q8H Simpson, Paula B, NP   5,000 Units at 12/26/24 0645   HYDROmorphone  (DILAUDID )  injection 0.5 mg  0.5 mg Intravenous Q4H PRN Simpson, Paula B, NP   0.5 mg at 12/26/24 1043   lidocaine  (LIDODERM ) 5 % 1 patch  1 patch Transdermal Q24H Simpson, Paula B, NP       lidocaine  (PF) (XYLOCAINE ) 1 % injection 5 mL  5 mL   Melvenia Motto, MD   5 mL at 12/25/24 0920   methocarbamol  (ROBAXIN ) injection 500 mg  500 mg Intravenous Q6H PRN Antonetta Moccasin B, NP       norepinephrine  (LEVOPHED ) 4mg  in (0.016 mg/mL) premix infusion  0-40 mcg/min Intravenous Titrated Melvenia Motto, MD   Stopped at 12/26/24 [phone]   polyethylene glycol (MIRALAX  / GLYCOLAX ) packet 17 g  17 g Oral Daily Antonetta Moccasin B, NP   17 g at 12/26/24 1031     Objective: Vital signs in last 24 hours: Temp:  [97.2 F (36.2 C)-100.3 F (37.9 C)] 98.2 F (36.8 C) (12/30 0900) Pulse Rate:  [72-104] 86 (12/30 0900) Resp:  [10-39] 15 (12/30 0900) BP: (83-131)/(54-83) 120/69 (12/30 0900) SpO2:  [93 %-100 %] 100 % (12/30 0900)  Intake/Output from previous day: 12/29 0701 -  12/30 0700 In: 4448.8 [I.V.:1527.7; IV Piggyback:2921.1] Out: 6085 [Urine:6080; Blood:5] Intake/Output this shift: Total I/O In: 18.3 [I.V.:16.2; IV Piggyback:2.1] Out: -    Physical Exam Vitals reviewed.  Constitutional:      Appearance: Normal appearance.  Cardiovascular:     Rate and Rhythm: Normal rate and regular rhythm.  Pulmonary:     Effort: Pulmonary effort is normal. No respiratory distress.  Genitourinary:    Comments: Urine remains turbid.  Neurological:     Mental Status: He is alert.     Lab Results:  Recent Labs    12/25/24 0922 12/26/24 0349  WBC 12.6* 33.4*  HGB 12.8* 9.8*  HCT 39.7 30.5*  PLT 392 297   BMET Recent Labs    12/25/24 0922 12/26/24 0349  NA 138 137  K 4.2 4.7  CL 103 103  CO2 16* 21*  GLUCOSE 82 140*  BUN 89* 87*  CREATININE 3.90* 3.76*  CALCIUM 8.3* 8.1*   PT/INR Recent Labs    12/25/24 0922 12/26/24 0349  LABPROT 16.5* 17.2*  INR 1.3* 1.3*   ABG Recent Labs     12/25/24 1008  HCO3 22.0    Studies/Results: DG Abdomen 1 View Result Date: 12/25/2024 EXAM: 1 VIEW XRAY OF THE ABDOMEN 12/25/2024 03:36:11 PM COMPARISON: None available. CLINICAL HISTORY: s/p central line FINDINGS: LINES, TUBES AND DEVICES: Right femoral central venous catheter in place with tip overlying the expected region of the IVC. Foley catheter in place overlying the pelvis. BOWEL: Few mildly dilated loops of small bowel in the left hemiabdomen consistent with possible ileus or early small bowel obstruction. SOFT TISSUES: No abnormal calcifications. BONES: Partially visualized left femoral neck fixation screw noted (comparison pelvis x-ray 02/09/2023). Postsurgical changes of the lower lumbar spine. No acute fracture. JOINTS: Bilateral hip osteoarthritis. DISCS/DEGENERATIVE CHANGES: Multilevel degenerative changes of the spine. IMPRESSION: 1. Right femoral central venous catheter with the tip overlying the expected region of the IVC. 2. Few mildly dilated loops of small bowel in the left hemiabdomen, which may reflect ileus or early small bowel obstruction. Electronically signed by: Greig Pique MD 12/25/2024 05:38 PM EST RP Workstation: [id number]   CT CHEST ABDOMEN PELVIS WO CONTRAST Result Date: 12/25/2024 EXAM: CT CHEST, ABDOMEN AND PELVIS WITHOUT CONTRAST 12/25/2024 12:06:43 PM TECHNIQUE: CT of the chest, abdomen and pelvis was performed without the administration of intravenous contrast. Multiplanar reformatted images are provided for review. Automated exposure control, iterative reconstruction, and/or weight based adjustment of the mA/kV was utilized to reduce the radiation dose to as low as reasonably achievable. COMPARISON: 07/05/2023. CLINICAL HISTORY: Sepsis. FINDINGS: CHEST: MEDIASTINUM AND LYMPH NODES: Heart and pericardium are unremarkable. Calcifications within the left anterior descending and right coronary arteries. Aortic calcifications. The central airways are clear. No  mediastinal, hilar or axillary lymphadenopathy. LUNGS AND PLEURA: Small bilateral pleural effusions. Bibasilar atelectasis. No focal consolidation or pulmonary edema. No pneumothorax. ABDOMEN AND PELVIS: LIVER: The liver is unremarkable. GALLBLADDER AND BILE DUCTS: Gallbladder moderately distended. No visible stones or inflammatory changes. This could be further evaluated with right upper quadrant ultrasound if felt clinically indicated. No biliary ductal dilatation. SPLEEN: No acute abnormality. PANCREAS: No acute abnormality. ADRENAL GLANDS: No acute abnormality. KIDNEYS, URETERS AND BLADDER: Moderate right hydronephrosis due to 4 mm distal right ureteral stone. Foley catheter in the bladder, which is decompressed. No stones in the left kidney or ureter. No perinephric or periureteral stranding. GI AND BOWEL: Stomach demonstrates no acute abnormality. Scattered colonic diverticulosis. No active diverticulitis. There is no bowel  obstruction. REPRODUCTIVE ORGANS: No acute abnormality. PERITONEUM AND RETROPERITONEUM: No ascites. No free air. VASCULATURE: Aorta is normal in caliber. Aortic atherosclerosis. ABDOMINAL AND PELVIS LYMPH NODES: No lymphadenopathy. BONES AND SOFT TISSUES: Postoperative changes in the thoracic spine, lumbar spine and left hip. T9 fracture again noted as seen on prior CT and MRI. No focal soft tissue abnormality. IMPRESSION: 1. Moderate right hydronephrosis due to a 4 mm distal right ureteral stone. 2. Moderately distended gallbladder without visible stones or inflammatory changes; consider right upper quadrant ultrasound if indicated. 3. Small bilateral pleural effusions and bibasilar atelectasis. Electronically signed by: Franky Crease MD 12/25/2024 01:47 PM EST RP Workstation: [id number]   CT Head Wo Contrast Result Date: 12/25/2024 EXAM: CT HEAD WITHOUT CONTRAST 12/25/2024 12:06:43 PM TECHNIQUE: CT of the head was performed without the administration of intravenous contrast. Automated  exposure control, iterative reconstruction, and/or weight based adjustment of the mA/kV was utilized to reduce the radiation dose to as low as reasonably achievable. COMPARISON: Head CT 12/09/2024 and MRI 07/19/2021. CLINICAL HISTORY: Mental status change, unknown cause. FINDINGS: BRAIN AND VENTRICLES: There is no evidence of an acute infarct, intracranial hemorrhage, mass, midline shift, hydrocephalus, or extra-axial fluid collection. There is mild cerebral atrophy. Patchy hypodensities in the cerebral white matter bilaterally are similar to the prior CT and nonspecific but compatible with moderate chronic small vessel ischemic disease. 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 Calcified atherosclerosis at the skull base. ORBITS: Bilateral cataract extraction. SINUSES: No acute abnormality. SOFT TISSUES AND SKULL: No acute soft tissue abnormality. No skull fracture. IMPRESSION: 1. No acute intracranial abnormality. 2. Moderate chronic small vessel ischemic disease. Electronically signed by: Dasie Hamburg MD 12/25/2024 12:28 PM EST RP Workstation: [id number]   DG Chest Port 1 View Result Date: 12/25/2024 EXAM: 1 VIEW(S) XRAY OF THE CHEST 12/25/2024 09:33:00 AM COMPARISON: Chest x-ray 02/09/2023. CLINICAL HISTORY: Questionable sepsis - evaluate for abnormality. FINDINGS: LUNGS AND PLEURA: Low lung volumes accentuate the pulmonary vasculature. No focal pulmonary opacity. No pleural effusion. No pneumothorax. HEART AND MEDIASTINUM: Low lung volumes accentuate the cardiomediastinal silhouette. No acute abnormality of the cardiac and mediastinal silhouettes. BONES AND SOFT TISSUES: Mid/lower thoracic spinal fusion hardware. IMPRESSION: 1. No acute cardiopulmonary findings. Electronically signed by: Morgane Naveau MD 12/25/2024 11:44 AM EST RP Workstation: [id number]     Assessment and Plan: Right ureteral stone with pyonephrosis and sepsis with  AKI improving on ABX s/p stenting.  He will need ureteroscopy in 2-3 weeks.  His urologic care has been managed at [facility] over the past year and he could f/u there if he prefers or with us .  Urine culture from renal pelvis pending.   2.   History of bladder cancer.   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    3.  Chronic foley catheter for retention.   No need for voiding trial this admission.       LOS: 1 day    Zachary [name] 12/26/2024

## 2024-12-26 NOTE — Consult Note (Signed)
 WOC Nurse Consult Note: Reason for Consult: sacrum, left heel  Wound type: Stage 1 Pressure Injury: left buttock; red, intact, non blanchable (per nursing FS) Deep Tissue Pressure Injury; left achilles; dark purple; non blanchable (per nursing FS) Pressure Injury POA: Yes Measurement: see nursing flow sheets Wound bed: see above  Drainage (amount, consistency, odor) none Periwound: intact Dressing procedure/placement/frequency: Nursing skin care order set for Stage 1 implemented Turn and reposition per hospital policy Add chair pressure redistribution pad when up in chair Offload achilles with pillows and Prevalon boot Silicone foam dressings to the left achilles and buttock, change every 3 days. ASSESS UNDER dressings each shift for any acute changes in the wounds.     Re consult if needed, will not follow at this time. Thanks  Breylon Sherrow [employer], RN,CWOCN, CNS, [employer] [phone]

## 2024-12-26 NOTE — Progress Notes (Signed)
 Chaplain responded to call from pt's nurse who requested Chaplain visit to help calm pt who is talking, talking, sometimes in circles, wanting nurse to stay with him, etc.  Chaplain visited w/ pt who was confessing his sins, which he has done to his pastor and associate pastor already; worries God has not forgiven him. Chaplain reminded pt God knows his every sin, knows he has repented and asked for forgiveness - which has been promised to us  by Christ giving his life for us .  Pt agrees and changes subject about another person he said he was sorry but doesn't know if he's forgiven.  On and on with things that bother him.  Chaplain prayed with pt asking God to give him peace - the peace that passes all understanding of God's love and forgiveness.  Immediately upon Chaplain departing, pt called for his nurse.   Appears no amount of soothing or reassuring pt will suffice.    [place] Chaplain

## 2024-12-26 NOTE — TOC Initial Note (Signed)
 Transition of Care [facility]) - Initial/Assessment Note    Patient Details  Name: Zachary [name] MRN: [id number] Date of Birth: [birth year]  [facility] CM/SW Contact:    Zachary [name], LCSWA Phone Number: 12/26/2024, 11:34 AM  Clinical Narrative:                  11:34 AM Per chart review, patient is from [facility] SNF. SNF confirmed patient was STR at SNF and able to return with insurance authorization approval. CSW to send SNF patient's FL2 upon PT/OT evaluation. Patient has a PCP and insurance listed on chart. CSW will continue to follow.  Expected Discharge Plan: Skilled Nursing Facility Barriers to Discharge: Continued Medical Work up, English As A Second Language Teacher   Patient Goals and CMS Choice            Expected Discharge Plan and Services In-house Referral: Clinical Social Work     Living arrangements for the past 2 months: Single Family Home, Skilled Nursing Facility                                      Prior Living Arrangements/Services Living arrangements for the past 2 months: Single Family Home, Skilled Nursing Facility Lives with:: Spouse, Facility Resident Patient language and need for interpreter reviewed:: Yes              Criminal Activity/Legal Involvement Pertinent to Current Situation/Hospitalization: No - Comment as needed  Activities of Daily Living      Permission Sought/Granted Permission sought to share information with : Family Supports, Oceanographer granted to share information with : No (Contact information on chart)  Share Information with NAME: Zachary [name]  Permission granted to share info w AGENCY: [facility] SN[facility]  Permission granted to share info w Relationship: Spouse  Permission granted to share info w Contact Information: [phone]  Emotional Assessment       Orientation: : Oriented to Self, Oriented to Place, Oriented to  Time, Oriented to  Situation Alcohol  / Substance Use: Not Applicable Psych Involvement: No (comment)  Admission diagnosis:  Nephrolithiasis [N20.0] Septic shock (HCC) [A41.9, R65.21] Urinary tract infection with hematuria, site unspecified [N39.0, R31.9] Patient Active Problem List   Diagnosis Date Noted   Septic shock (HCC) 12/25/2024   ABLA (acute blood loss anemia) 12/20/2024   Depression with anxiety 12/20/2024   Increased intraocular pressure, bilateral 12/20/2024   Unspecified protein-calorie malnutrition 12/20/2024   Neurocognitive deficits 12/20/2024   T9 vertebral fracture (HCC) 12/10/2024   Chronic kidney disease (CKD), stage III (moderate) (HCC) 12/10/2024   Neuropathy 04/12/2024   Subclinical hyperthyroidism 09/20/2018   Prediabetes 09/20/2018   S/P total knee replacement, 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 02/07/2018   Primary osteoarthritis of left knee 02/01/2018   Spinal stenosis 04/12/2017   Dysphagia 07/23/2016   Obstructive sleep apnea 07/21/2016   Hyperglycemia 07/21/2016   Spinal stenosis of lumbar region 07/15/2016   Bladder tumor 07/27/2015   Epiretinal membrane (ERM) of left eye 03/14/2014   H/O retinal detachment 03/14/2014   Pseudophakia of both eyes 03/14/2014   Retinal detachment 03/14/2014   Visual distortion 03/14/2014   HNP (herniated nucleus pulposus), cervical 06/05/2013    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 [phone] - [place], [place] - [address] [place] [address] Phone: [phone] Fax: [phone]  [facility] - [place], [place] - [address] [place]  [address] Phone: [phone] Fax: [phone]     Social Drivers of Health (SDOH) Social History: SDOH Screenings   Food Insecurity: Unknown (12/26/2024)  Housing: Unknown (12/26/2024)  Transportation Needs: Unknown (12/26/2024)  Utilities: Patient Unable To Answer (12/26/2024)  Depression (PHQ2-9): High Risk (07/26/2023)  Social Connections: Unknown (12/26/2024)  Tobacco Use: Medium Risk (12/25/2024)   SDOH Interventions:     Readmission Risk Interventions    12/10/2024    9:44 AM 02/11/2023   12:02 PM  Readmission Risk Prevention Plan  Medication Screening Complete   Transportation Screening Complete Complete  PCP or Specialist Appt within 5-7 Days  Complete  Home Care Screening  Complete  Medication Review (RN CM)  Complete

## 2024-12-26 NOTE — Progress Notes (Signed)
 Agitation  Possible sun downing  - Haldol   mg ordered

## 2024-12-26 NOTE — Evaluation (Signed)
 Clinical/Bedside Swallow Evaluation Patient Details  Name: Zachary [name] MRN: [id number] Date of Birth: [birth year]  Today's Date: 12/26/2024 Time: SLP Start Time (ACUTE ONLY): 0935 SLP Stop Time (ACUTE ONLY): 1003 SLP Time Calculation (min) (ACUTE ONLY): 28 min  Past Medical History:  Past Medical History:  Diagnosis Date   Anxiety    takes Valium  as needed   Arthritis    Bladder cancer (HCC)    takes Rapaflo daily   Blood dyscrasia    07/08/16: pt told he was a free bleeder after bleeding a lot when derm cut off mole on forehead. No excessive bleeding with minor wounds at home, no bleeding problems perioperatively with prior surgeries.   Chronic back pain    spondylolisthesis   Cluster headaches    Depression    takes Lexapro  daily   Essential hypertension, benign    takes Diovan -HCT daily   Hearing loss    hearing aids   History of kidney stones    Hx of complications due to general anesthesia    Aborted surgery 07/15/2016 due to hypotension per pt   Intertrochanteric fracture of left femur (HCC) 02/09/2023   Joint pain    Kidney stone 03/2019   passed stone   Obstructive sleep apnea on CPAP    uses CPAP @ night   Parkinsonism (HCC)    Pneumonia    several times--last time about 3-4 yrs ago   Pre-diabetes    Prediabetes    Syncopal episodes    Thyroid  condition    Past Surgical History:  Past Surgical History:  Procedure Laterality Date   ANTERIOR CERVICAL DECOMP/DISCECTOMY FUSION N/A 06/05/2013   Procedure:  C3-4 Anterior Cervical Discectomy and Fusion, Allograft, Plate;  Surgeon: Oneil JAYSON Herald, MD;  Location: [facility];  Service: Orthopedics;  Laterality: N/A;  C3-4 Anterior Cervical Discectomy and Fusion, Allograft, Plate   Arthroscopic knee surgery     BACK SURGERY      x 2   BLADDER SURGERY     x 5 to remove tumor   CARPAL TUNNEL RELEASE     CATARACT EXTRACTION W/PHACO  12/19/2012   Procedure: CATARACT EXTRACTION PHACO AND INTRAOCULAR LENS PLACEMENT (IOC);   Surgeon: Cherene Mania, MD;  Location: [facility];  Service: Ophthalmology;  Laterality: Left;  CDE: 26.80   CATARACT EXTRACTION W/PHACO  01/09/2013   Procedure: CATARACT EXTRACTION PHACO AND INTRAOCULAR LENS PLACEMENT (IOC);  Surgeon: Cherene Mania, MD;  Location: [facility];  Service: Ophthalmology;  Laterality: Right;  CDE:22.83   CERVICAL FUSION  06/05/2013   C 3  C4   CYSTOSCOPY     CYSTOSCOPY W/ URETERAL STENT PLACEMENT Bilateral 02/12/2020   Procedure: CYSTOSCOPY WITH RETROGRADE PYELOGRAM/URETERAL STENT PLACEMENT;  Surgeon: Watt Rush, MD;  Location: [facility];  Service: Urology;  Laterality: Bilateral;   CYSTOSCOPY W/ URETERAL STENT PLACEMENT Right 12/25/2024   Procedure: CYSTOSCOPY, WITH RETROGRADE PYELOGRAM AND URETERAL STENT INSERTION;  Surgeon: Watt Rush, MD;  Location[facility] OR;  Service: Urology;  Laterality: Right;   INTRAMEDULLARY (IM) NAIL INTERTROCHANTERIC Left 02/10/2023   Procedure: LEFT INTRAMEDULLARY (IM) NAILING OF LEFT FEMUR;  Surgeon: Kendal Franky SQUIBB, MD;  Location: [facility] OR;  Service: Orthopedics;  Laterality: Left;   LAMINECTOMY WITH POSTERIOR LATERAL ARTHRODESIS LEVEL 4 N/A 12/13/2024   Procedure: Thoracic Seven-Thoracic Eight - Thoracic Eight-Thoracic Nine - Thoracic Nine-Thoracic Ten, Thoracic Ten-Thoracic Eleven pedicle screw fixation and posterolateral arthrodesis decompression of Thoracic Eight;  Surgeon: Onetha Kuba, MD;  Location: [facility] OR;  Service: Neurosurgery;  Laterality:  N/A;  T7-T8 - T8-T9 - T9-T10, T10-11 pedicle screw fixation and posterolateral arthrodesis  decompress   LITHOTRIPSY  02/2020   REFRACTIVE SURGERY     Hx: of   RETINAL DETACHMENT SURGERY     TOTAL KNEE ARTHROPLASTY Left 02/01/2018   Procedure: TOTAL KNEE ARTHROPLASTY;  Surgeon: Margrette Taft BRAVO, MD;  Location: [facility];  Service: Orthopedics;  Laterality: Left;   TRANSURETHRAL RESECTION OF BLADDER TUMOR Right 07/27/2015   Procedure: TRANSURETHRAL RESECTION OF BLADDER TUMOR (TURBT);  Surgeon: Arlena Gal, MD;   [facility]: [facility];  Service: Urology;  Laterality: Right;   TRANSURETHRAL RESECTION OF BLADDER TUMOR WITH MITOMYCIN -C N/A 01/25/2023   Procedure: TRANSURETHRAL RESECTION OF BLADDER TUMOR WITH GEMCITABINE ;  Surgeon: Carolee Sherwood JONETTA DOUGLAS, MD;  Location: [facility];  Service: Urology;  Laterality: N/A;   wisdom tooth extraction     HPI:  Zachary [name] is a 78 yo M who was from rehab with altered mental status, hypotension, tachycardia, and hypoxia. Found to have septic shock 2/2 ureteral stone now s/p cystoscopy and stent placement. Chest Ct 12/29 with Small bilateral pleural effusions. Bibasilar atelectasis. No focal consolidation or pulmonary edema. No pneumothorax. Head CT 12/29 with no acute findings.  PMH significant for HTN, HLD, CKD 3b, bladder cancer s/p multiple surgeries and TURBT (12/2022), low-grade papillary urethral carcinoma, urinary retention requiring intermittent catheterizations 3-4x/ day, pre-DM, OSA, chronic pain, nephrolithiasis, anxiety, and depression. Recent hospitalization with T9 vertebral fracture after mechanical fall s/p posterior decompression and fusion from T7-T11 on 12/17 with Dr. Onetha discharged to [facility] rehab 12/23.    Assessment / Plan / Recommendation  Clinical Impression  Pt presents with clinical indicators of pharyngeal dysphagia.  Pt unable to tolerated HOB raised much above 30 degrees 2/2 pain, and suspect that positioning contributes to impairment. Pt tolerated ice chips and water  by teaspoon.  Pt exhbited prolonged, but very week reflexive cough following initial straw sips of water .  This improved when pt was asked to lift head and look at his lap while drinking, but further episode of coughing noted, which is suspected to be related to fatigue and or discomfort with holding that posture.  Multiple swallows observed frequently with thin liquids. Pt tolerated nectar thick liquid by straw and cup sip without any clinical s/s of aspiration.  Pt noted to take very small  sips and appeared to have difficulty attaining labial seal for efficient siphoning from straw.  Pt has no history of dysphagia and has had no specific concerns this admission for acute onset dysphagia, outside of his general deconditioning.  Suspect swallow function with improve with pt's overall medical status.  SLP to follow for tolerance and readiness for advancement v need for instrumental should symptoms persist.  If objective swallow study were indicated, consider FEES given pt's discomfort with upright position which would be needed for MBS.    Recommend regular texture diet with nectar thick liquids.    SLP Visit Diagnosis: Dysphagia, unspecified (R13.10)    Aspiration Risk  Mild aspiration risk    Diet Recommendation Regular;Nectar-thick liquid    Liquid Administration via: Cup;Straw Medication Administration: Whole meds with puree Supervision: Staff to assist with self feeding;Full supervision/cueing for compensatory strategies Compensations: Slow rate;Small sips/bites Postural Changes:  (HOB as upright as tolerated)    Other Recommendations Oral Care Recommendations: Oral care BID Caregiver Recommendations: Avoid jello, ice cream, thin soups, popsicles     Swallow Evaluation Recommendations Caregiver Recommendations: Avoid jello, ice cream, thin soups, popsicles  Assistance Recommended at Discharge  N/A  Functional Status Assessment Patient has had a recent decline in their functional status and demonstrates the ability to make significant improvements in function in a reasonable and predictable amount of time.  Frequency and Duration min 2x/week  2 weeks       Prognosis Prognosis for improved oropharyngeal function: Good      Swallow Study   General Date of Onset: 12/25/24 HPI: Zachary [name] is a 78 yo M who was from rehab with altered mental status, hypotension, tachycardia, and hypoxia. Found to have septic shock 2/2 ureteral stone now s/p cystoscopy and stent  placement. Chest Ct 12/29 with Small bilateral pleural effusions. Bibasilar atelectasis. No focal consolidation or pulmonary edema. No pneumothorax. Head CT 12/29 with no acute findings.  PMH significant for HTN, HLD, CKD 3b, bladder cancer s/p multiple surgeries and TURBT (12/2022), low-grade papillary urethral carcinoma, urinary retention requiring intermittent catheterizations 3-4x/ day, pre-DM, OSA, chronic pain, nephrolithiasis, anxiety, and depression. Recent hospitalization with T9 vertebral fracture after mechanical fall s/p posterior decompression and fusion from T7-T11 on 12/17 with Dr. Onetha discharged to [facility] rehab 12/23. Type of Study: Bedside Swallow Evaluation Previous Swallow Assessment: none Diet Prior to this Study: NPO Temperature Spikes Noted: No Respiratory Status: Nasal cannula History of Recent Intubation: No Behavior/Cognition: Alert;Cooperative Oral Cavity Assessment: Dry Oral Care Completed by SLP: Yes Oral Cavity - Dentition: Adequate natural dentition;Missing dentition Self-Feeding Abilities: Total assist Patient Positioning: Partially reclined;Postural control interferes with function (Poor tolerance of HOB above 30 2/2 pain) Baseline Vocal Quality: Low vocal intensity Volitional Cough: Weak Volitional Swallow: Able to elicit    Oral/Motor/Sensory Function Overall Oral Motor/Sensory Function: Mild impairment Facial ROM: Reduced right;Reduced left Facial Symmetry: Within Functional Limits Facial Strength: Reduced right;Reduced left Lingual ROM: Reduced right;Reduced left Lingual Strength: Reduced Velum: Within Functional Limits Mandible: Within Functional Limits   Ice Chips Ice chips: Within functional limits Presentation: Spoon   Thin Liquid Thin Liquid: Impaired Presentation: Spoon;Straw Pharyngeal  Phase Impairments: Cough - Immediate    Nectar Thick Nectar Thick Liquid: Within functional limits Presentation: Straw;Cup   Honey Thick Honey Thick  Liquid: Not tested   Puree Puree: Within functional limits   Solid     Solid: Within functional limits      Zachary FORBES Grippe, MA, CCC-SLP Acute Rehabilitation Services Office: [phone] 12/26/2024,10:28 AM

## 2024-12-26 NOTE — Progress Notes (Signed)
" °  Echocardiogram 2D Echocardiogram has been performed.  Zachary [name] 12/26/2024, 8:46 AM "

## 2024-12-26 NOTE — Progress Notes (Signed)
 "  NAME:  Zachary [name], MRN:  [id number], DOB:  03/08/1946, LOS: 1 ADMISSION DATE:  12/25/2024, CONSULTATION DATE:  12/25/24 REFERRING MD:  Dr. Melvenia, CHIEF COMPLAINT:  AMS   History of Present Illness:   85 yoM with PMH as below significant for HTN, HLD, CKD 3b, bladder cancer s/p multiple surgeries and TURBT (12/2022), low-grade papillary urethral carcinoma, urinary retention requiring intermittent catheterizations 3-4x/ day, pre-DM, OSA, chronic pain, nephrolithiasis, anxiety, and depression.   Recent hospitalization with T9 vertebral fracture after mechanical fall s/p posterior decompression and fusion from T7-T11 on 12/17 with Dr. Onetha discharged to [facility] rehab 12/23.  Sent from rehab with altered mental status, hypotension, tachycardia, and hypoxia.  At baseline, ambulatory and conversant.  Treated with EMS with IM rocephin , 1L LR, and NRB.  Wife reported worsening mentation for 3 days.   In ER, febrile 102.4, tachycardic 118, NRB weaned to [place] 6L and persistent hypotension requiring norepinephrine  despite additional 2L LR, oriented to self, MAE and following commands. Bladder scan with > 1L in bladder despite foley placement which subsequently was exchanged with large volume purulent drainage.    Labs notable for BUN/ sCr 89/ 3.9 (most recently 24/ 1.59 on 12/23), bicarb 16, alk phos 257, albumin  2.8, protein 6.4, pBNP 2105, trop 46, lactic acid 2.7> 2.3, WBC 12.6, Hgb 12.8, INR 1.3, VBG 7.43/ 34/ 50/ 22, UA turbid, mod leukocytes, small Hgb otherwise neg nitrates, no bacteria, RBC 0-5, WBC 0-5, CXR no acute cardiopulmonary process, CT head NAICA, CT chest/ abd/ pelvis wo contrast showing moderate right sided hydronephrosis due to 4mm distal right ureteral stone without perinephric or periureteral stranding, moderately distended gallbladder without visible stones or inflammatory changes, small bilateral pleural effusions with bibasilar atelectasis.  Blood and urine cultures sent, stated on  cefepime , flagyl , and vancomycin .  Right femoral CVL placed in ER .  To be transferred to [facility] for further ICU evaluation and management.   On arrival, pt alert and oriented x 3.  Complaining of midline back pain, denies flank pain.  Also c/o of mild umbilical pain, unsure when last BM was but charted type 6/ mushy in ER.  Denies SOB, CP, N/V, or lower leg pain or swelling.   Pertinent  Medical History   Past Medical History:  Diagnosis Date   Anxiety    takes Valium  as needed   Arthritis    Bladder cancer (HCC)    takes Rapaflo daily   Blood dyscrasia    07/08/16: pt told he was a free bleeder after bleeding a lot when derm cut off mole on forehead. No excessive bleeding with minor wounds at home, no bleeding problems perioperatively with prior surgeries.   Chronic back pain    spondylolisthesis   Cluster headaches    Depression    takes Lexapro  daily   Essential hypertension, benign    takes Diovan -HCT daily   Hearing loss    hearing aids   History of kidney stones    Hx of complications due to general anesthesia    Aborted surgery 07/15/2016 due to hypotension per pt   Intertrochanteric fracture of left femur (HCC) 02/09/2023   Joint pain    Kidney stone 03/2019   passed stone   Obstructive sleep apnea on CPAP    uses CPAP @ night   Parkinsonism (HCC)    Pneumonia    several times--last time about 3-4 yrs ago   Pre-diabetes    Prediabetes    Syncopal episodes  Thyroid  condition   Former smoker  Significant Hospital Events: Including procedures, antibiotic start and stop dates in addition to other pertinent events   12/29 tx from [facility] ER, septic shock 12/29-OR for cystoscopy, right ureteral stent placement, retrograde pyelography  Interim History / Subjective:  No overnight events Awake alert interactive Feels tired overall pain is adequately controlled  Objective    Blood pressure 120/69, pulse 86, temperature 98.2 F (36.8 C), resp. rate 15, SpO2 100%.         Intake/Output Summary (Last 24 hours) at 12/26/2024 1148 Last data filed at 12/26/2024 0800 Gross per 24 hour  Intake 3787.79 ml  Output 2005 ml  Net 1782.79 ml   There were no vitals filed for this visit.  Examination: General: Easily arousable alert oriented Chronically ill-appearing HEENT: Moist oral mucosa Neuro: Moving all extremities, generally weak CV: S1-S2 appreciated PULM: Bibasal rales, clear anteriorly GI: Soft, bowel sounds appreciated Extremities: Skin is warm and dry Skin: Wounds as noted        I reviewed last 24 h vitals and pain scores, last 48 h intake and output, last 24 h labs and trends, and last 24 h imaging results.  Resolved problem list   Assessment and Plan   Septic shock, urosepsis -Weaning pressors -Continue antibiotics, follow cultures  Hypoxia Atelectasis, pleural effusions -CPAP nightly -Continue submental oxygen  -Pulmonary hygiene  Metabolic encephalopathy - Negative CT head - Continue supportive measures  Acute kidney injury on chronic kidney disease stage IIIb Hydronephrosis - S/p cystoscopy, ureteral stent placement -Trend renal parameters - Avoid nephrotoxic medications - Maintain renal perfusion  Hypertension Home meds on hold - Follow-up on echo  Hyperlipidemia - Statin on hold  Protein calorie malnutrition - Will place diet order once evaluated by speech  T9 vertebral fracture s/p posterior decompression and fusion from T7-T11 on 12/17 with Dr. Arlyss -Site looks okay -PT OT -Pain management  Chronic pain Anxiety depression - Cymbalta  on hold will resume once mental status improves  Poor mobility - OT PT  Left heel DTPI Stage 1 sacrum pressure injury - WOC consult  GOC - previously DNR, [place] with [place] form.  Wife updated by phone by Dr. Theodoro.  Ok with intubation, pressors.   Labs   CBC: Recent Labs  Lab 12/25/24 0922 12/26/24 0349  WBC 12.6* 33.4*  NEUTROABS 12.1*   --   HGB 12.8* 9.8*  HCT 39.7 30.5*  MCV 88.4 89.2  PLT 392 297    Basic Metabolic Panel: Recent Labs  Lab 12/25/24 0922 12/26/24 0349  NA 138 137  K 4.2 4.7  CL 103 103  CO2 16* 21*  GLUCOSE 82 140*  BUN 89* 87*  CREATININE 3.90* 3.76*  CALCIUM 8.3* 8.1*  MG 2.1 2.0  PHOS  --  5.0*   GFR: Estimated Creatinine Clearance: 18.6 mL/min (A) (by C-G formula based on SCr of 3.76 mg/dL (H)). Recent Labs  Lab 12/25/24 0922 12/25/24 1115 12/26/24 0349  WBC 12.6*  --  33.4*  LATICACIDVEN 2.7* 2.3*  --     Liver Function Tests: Recent Labs  Lab 12/25/24 0922 12/26/24 0349  AST 30 23  ALT 18 15  ALKPHOS 257* 171*  BILITOT 0.5 0.3  PROT 6.4* 5.9*  ALBUMIN  2.8* 2.6*   No results for input(s): LIPASE, AMYLASE in the last 168 hours. No results for input(s): AMMONIA in the last 168 hours.  ABG    Component Value Date/Time   PHART 7.374 12/13/2024 1620  PCO2ART 42.1 12/13/2024 1620   PO2ART 139 (H) 12/13/2024 1620   HCO3 22.0 12/25/2024 1008   TCO2 26 12/13/2024 1620   ACIDBASEDEF 0.9 12/25/2024 1008   O2SAT 76.3 12/25/2024 1008     Coagulation Profile: Recent Labs  Lab 12/25/24 0922 12/26/24 0349  INR 1.3* 1.3*    Cardiac Enzymes: No results for input(s): CKTOTAL, CKMB, CKMBINDEX, TROPONINI in the last 168 hours.  HbA1C: Hgb A1c MFr Bld  Date/Time Value Ref Range Status  12/01/2022 09:27 AM 6.3 4.6 - 6.5 % Final    Comment:    Glycemic Control Guidelines for People with Diabetes:Non Diabetic:  <6%Goal of Therapy: <7%Additional Action Suggested:  >8%   07/22/2016 07:00 AM 5.8 (H) 4.8 - 5.6 % Final    Comment:    (NOTE)         Pre-diabetes: 5.7 - 6.4         Diabetes: >6.4         Glycemic control for adults with diabetes: <7.0     CBG: Recent Labs  Lab 12/25/24 0926 12/25/24 1709 12/26/24 0731  GLUCAP 78 90 124*    Review of Systems:   Review of Systems  Constitutional:  Positive for fever.  Respiratory:  Negative for  cough, sputum production and shortness of breath.   Cardiovascular:  Negative for chest pain and leg swelling.  Gastrointestinal:  Positive for abdominal pain. Negative for nausea.  Genitourinary:  Negative for flank pain.  Neurological:  Negative for focal weakness.   Past Medical History:  He,  has a past medical history of Anxiety, Arthritis, Bladder cancer (HCC), Blood dyscrasia, Chronic back pain, Cluster headaches, Depression, Essential hypertension, benign, Hearing loss, History of kidney stones, complications due to general anesthesia, Intertrochanteric fracture of left femur (HCC) (02/09/2023), Joint pain, Kidney stone (03/2019), Obstructive sleep apnea on CPAP, Parkinsonism (HCC), Pneumonia, Pre-diabetes, Prediabetes, Syncopal episodes, and Thyroid  condition.   Surgical History:   Past Surgical History:  Procedure Laterality Date   ANTERIOR CERVICAL DECOMP/DISCECTOMY FUSION N/A 06/05/2013   Procedure:  C3-4 Anterior Cervical Discectomy and Fusion, Allograft, Plate;  Surgeon: Oneil JAYSON Herald, MD;  Location: [facility] OR;  Service: Orthopedics;  Laterality: N/A;  C3-4 Anterior Cervical Discectomy and Fusion, Allograft, Plate   Arthroscopic knee surgery     BACK SURGERY      x 2   BLADDER SURGERY     x 5 to remove tumor   CARPAL TUNNEL RELEASE     CATARACT EXTRACTION W/PHACO  12/19/2012   Procedure: CATARACT EXTRACTION PHACO AND INTRAOCULAR LENS PLACEMENT (IOC);  Surgeon: Cherene Mania, MD;  Location: [facility];  Service: Ophthalmology;  Laterality: Left;  CDE: 26.80   CATARACT EXTRACTION W/PHACO  01/09/2013   Procedure: CATARACT EXTRACTION PHACO AND INTRAOCULAR LENS PLACEMENT (IOC);  Surgeon: Cherene Mania, MD;  Location: [facility];  Service: Ophthalmology;  Laterality: Right;  CDE:22.83   CERVICAL FUSION  06/05/2013   C 3  C4   CYSTOSCOPY     CYSTOSCOPY W/ URETERAL STENT PLACEMENT Bilateral 02/12/2020   Procedure: CYSTOSCOPY WITH RETROGRADE PYELOGRAM/URETERAL STENT PLACEMENT;  Surgeon: Watt Rush, MD;   Location: [facility];  Service: Urology;  Laterality: Bilateral;   CYSTOSCOPY W/ URETERAL STENT PLACEMENT Right 12/25/2024   Procedure: CYSTOSCOPY, WITH RETROGRADE PYELOGRAM AND URETERAL STENT INSERTION;  Surgeon: Watt Rush, MD;  Location: [facility] OR;  Service: Urology;  Laterality: Right;   INTRAMEDULLARY (IM) NAIL INTERTROCHANTERIC Left 02/10/2023   Procedure: LEFT INTRAMEDULLARY (IM) NAILING OF LEFT FEMUR;  Surgeon:  Haddix, Franky SQUIBB, MD;  Location: [facility] OR;  Service: Orthopedics;  Laterality: Left;   LAMINECTOMY WITH POSTERIOR LATERAL ARTHRODESIS LEVEL 4 N/A 12/13/2024   Procedure: Thoracic Seven-Thoracic Eight - Thoracic Eight-Thoracic Nine - Thoracic Nine-Thoracic Ten, Thoracic Ten-Thoracic Eleven pedicle screw fixation and posterolateral arthrodesis decompression of Thoracic Eight;  Surgeon: Onetha Kuba, MD;  Location: [facility] OR;  Service: Neurosurgery;  Laterality: N/A;  T7-T8 - T8-T9 - T9-T10, T10-11 pedicle screw fixation and posterolateral arthrodesis  decompress   LITHOTRIPSY  02/2020   REFRACTIVE SURGERY     Hx: of   RETINAL DETACHMENT SURGERY     TOTAL KNEE ARTHROPLASTY Left 02/01/2018   Procedure: TOTAL KNEE ARTHROPLASTY;  Surgeon: Margrette Taft BRAVO, MD;  Location: [facility];  Service: Orthopedics;  Laterality: Left;   TRANSURETHRAL RESECTION OF BLADDER TUMOR Right 07/27/2015   Procedure: TRANSURETHRAL RESECTION OF BLADDER TUMOR (TURBT);  Surgeon: Arlena Gal, MD;  Location: [facility];  Service: Urology;  Laterality: Right;   TRANSURETHRAL RESECTION OF BLADDER TUMOR WITH MITOMYCIN -C N/A 01/25/2023   Procedure: TRANSURETHRAL RESECTION OF BLADDER TUMOR WITH GEMCITABINE ;  Surgeon: Carolee Sherwood JONETTA DOUGLAS, MD;  Location: [facility];  Service: Urology;  Laterality: N/A;   wisdom tooth extraction       Social History:   reports that he quit smoking about 48 years ago. His smoking use included cigarettes and cigars. He started smoking about 49 years ago. He has a 0.3 pack-year smoking history. He has been exposed  to tobacco smoke. He has never used smokeless tobacco. He reports that he does not drink alcohol  and does not use drugs.   Family History:  His family history includes Alcohol  abuse in his maternal grandfather and mother; Anxiety disorder in his sister; COPD in his mother; Cancer in his father; Depression in his mother; Hypertension in his father, mother, and sister. There is no history of ADD / ADHD, Bipolar disorder, Dementia, Drug abuse, OCD, Paranoid behavior, Schizophrenia, Seizures, Sexual abuse, or Physical abuse.   Allergies Allergies[1]   The patient is critically ill with multiple organ systems failure and requires high complexity decision making for assessment and support, frequent evaluation and titration of therapies, application of advanced monitoring technologies and extensive interpretation of multiple databases. Critical Care Time devoted to patient care services described in this note independent of APP/resident time (if applicable)  is 33 minutes.   Jennet Epley MD [facility] Personal pager: See Tracey If unanswered, please page CCM On-call: #[phone]     [1]  Allergies Allergen Reactions   Poison Oak Extract Hives and Itching   Sulfa Antibiotics Other (See Comments)    Unknown    Sulfonamide Derivatives Itching and Rash   "

## 2024-12-26 NOTE — Plan of Care (Signed)

## 2024-12-27 DIAGNOSIS — N136 Pyonephrosis: Secondary | ICD-10-CM | POA: Diagnosis not present

## 2024-12-27 DIAGNOSIS — N1330 Unspecified hydronephrosis: Secondary | ICD-10-CM | POA: Diagnosis not present

## 2024-12-27 DIAGNOSIS — E785 Hyperlipidemia, unspecified: Secondary | ICD-10-CM | POA: Diagnosis not present

## 2024-12-27 DIAGNOSIS — A4151 Sepsis due to Escherichia coli [E. coli]: Secondary | ICD-10-CM | POA: Diagnosis not present

## 2024-12-27 DIAGNOSIS — E46 Unspecified protein-calorie malnutrition: Secondary | ICD-10-CM

## 2024-12-27 DIAGNOSIS — I129 Hypertensive chronic kidney disease with stage 1 through stage 4 chronic kidney disease, or unspecified chronic kidney disease: Secondary | ICD-10-CM | POA: Diagnosis not present

## 2024-12-27 DIAGNOSIS — G4733 Obstructive sleep apnea (adult) (pediatric): Secondary | ICD-10-CM | POA: Diagnosis not present

## 2024-12-27 DIAGNOSIS — G9341 Metabolic encephalopathy: Secondary | ICD-10-CM | POA: Diagnosis not present

## 2024-12-27 DIAGNOSIS — F418 Other specified anxiety disorders: Secondary | ICD-10-CM | POA: Diagnosis not present

## 2024-12-27 DIAGNOSIS — F32A Depression, unspecified: Secondary | ICD-10-CM | POA: Diagnosis not present

## 2024-12-27 DIAGNOSIS — N1832 Chronic kidney disease, stage 3b: Secondary | ICD-10-CM | POA: Diagnosis not present

## 2024-12-27 DIAGNOSIS — G8929 Other chronic pain: Secondary | ICD-10-CM | POA: Diagnosis not present

## 2024-12-27 DIAGNOSIS — L89151 Pressure ulcer of sacral region, stage 1: Secondary | ICD-10-CM | POA: Diagnosis not present

## 2024-12-27 DIAGNOSIS — A4181 Sepsis due to Enterococcus: Secondary | ICD-10-CM | POA: Diagnosis not present

## 2024-12-27 DIAGNOSIS — R6521 Severe sepsis with septic shock: Secondary | ICD-10-CM | POA: Diagnosis not present

## 2024-12-27 DIAGNOSIS — N179 Acute kidney failure, unspecified: Secondary | ICD-10-CM | POA: Diagnosis not present

## 2024-12-27 DIAGNOSIS — A419 Sepsis, unspecified organism: Secondary | ICD-10-CM | POA: Diagnosis not present

## 2024-12-27 LAB — BASIC METABOLIC PANEL WITH GFR
Anion gap: 14 (ref 5–15)
BUN: 91 mg/dL — ABNORMAL HIGH (ref 8–23)
CO2: 20 mmol/L — ABNORMAL LOW (ref 22–32)
Calcium: 8.3 mg/dL — ABNORMAL LOW (ref 8.9–10.3)
Chloride: 108 mmol/L (ref 98–111)
Creatinine, Ser: 3.09 mg/dL — ABNORMAL HIGH (ref 0.61–1.24)
GFR, Estimated: 20 mL/min — ABNORMAL LOW
Glucose, Bld: 126 mg/dL — ABNORMAL HIGH (ref 70–99)
Potassium: 4.5 mmol/L (ref 3.5–5.1)
Sodium: 142 mmol/L (ref 135–145)

## 2024-12-27 LAB — CBC
HCT: 31.5 % — ABNORMAL LOW (ref 39.0–52.0)
Hemoglobin: 10.4 g/dL — ABNORMAL LOW (ref 13.0–17.0)
MCH: 28.3 pg (ref 26.0–34.0)
MCHC: 33 g/dL (ref 30.0–36.0)
MCV: 85.8 fL (ref 80.0–100.0)
Platelets: 333 K/uL (ref 150–400)
RBC: 3.67 MIL/uL — ABNORMAL LOW (ref 4.22–5.81)
RDW: 15 % (ref 11.5–15.5)
WBC: 28.9 K/uL — ABNORMAL HIGH (ref 4.0–10.5)
nRBC: 0.1 % (ref 0.0–0.2)

## 2024-12-27 LAB — GLUCOSE, CAPILLARY
Glucose-Capillary: 106 mg/dL — ABNORMAL HIGH (ref 70–99)
Glucose-Capillary: 111 mg/dL — ABNORMAL HIGH (ref 70–99)
Glucose-Capillary: 126 mg/dL — ABNORMAL HIGH (ref 70–99)
Glucose-Capillary: 121 mg/dL — ABNORMAL HIGH (ref 70–99)
Glucose-Capillary: 137 mg/dL — ABNORMAL HIGH (ref 70–99)

## 2024-12-27 LAB — URINE CULTURE: Culture: 60000 — AB

## 2024-12-27 LAB — PHOSPHORUS: Phosphorus: 3.9 mg/dL (ref 2.5–4.6)

## 2024-12-27 LAB — MAGNESIUM: Magnesium: 2.2 mg/dL (ref 1.7–2.4)

## 2024-12-27 MED ORDER — AMOXICILLIN-POT CLAVULANATE 875-125 MG PO TABS
1.0000 | ORAL_TABLET | Freq: Two times a day (BID) | ORAL | Status: DC
  Administered 2024-12-27: 1 via ORAL
  Filled 2024-12-27 (×2): qty 1

## 2024-12-27 MED ORDER — DULOXETINE HCL 60 MG PO CPEP
60.0000 mg | ORAL_CAPSULE | Freq: Two times a day (BID) | ORAL | Status: DC
  Administered 2024-12-27 – 2025-01-09 (×28): 60 mg via ORAL
  Filled 2024-12-27 (×2): qty 1
  Filled 2024-12-27: qty 2
  Filled 2024-12-27 (×11): qty 1
  Filled 2024-12-27: qty 2
  Filled 2024-12-27 (×11): qty 1
  Filled 2024-12-27: qty 2
  Filled 2024-12-27 (×2): qty 1

## 2024-12-27 MED ORDER — ACETAMINOPHEN 325 MG PO TABS
650.0000 mg | ORAL_TABLET | Freq: Four times a day (QID) | ORAL | Status: DC | PRN
  Administered 2024-12-27 – 2025-01-05 (×7): 650 mg via ORAL
  Filled 2024-12-27 (×7): qty 2

## 2024-12-27 MED ORDER — DORZOLAMIDE HCL-TIMOLOL MAL 2-0.5 % OP SOLN
1.0000 [drp] | Freq: Two times a day (BID) | OPHTHALMIC | Status: DC
  Administered 2024-12-27 – 2025-01-10 (×28): 1 [drp] via OPHTHALMIC
  Filled 2024-12-27 (×2): qty 10

## 2024-12-27 MED ORDER — OXYCODONE HCL 5 MG PO TABS
5.0000 mg | ORAL_TABLET | Freq: Four times a day (QID) | ORAL | Status: DC | PRN
  Administered 2024-12-27 – 2025-01-08 (×26): 5 mg via ORAL
  Filled 2024-12-27 (×28): qty 1

## 2024-12-27 MED ORDER — MELATONIN 3 MG PO TABS
3.0000 mg | ORAL_TABLET | Freq: Every day | ORAL | Status: DC
  Administered 2024-12-27 – 2025-01-09 (×14): 3 mg via ORAL
  Filled 2024-12-27 (×14): qty 1

## 2024-12-27 MED ORDER — LATANOPROST 0.005 % OP SOLN
1.0000 [drp] | Freq: Every day | OPHTHALMIC | Status: DC
  Administered 2024-12-27 – 2025-01-09 (×14): 1 [drp] via OPHTHALMIC
  Filled 2024-12-27 (×2): qty 2.5

## 2024-12-27 MED ORDER — AMOXICILLIN-POT CLAVULANATE 500-125 MG PO TABS
1.0000 | ORAL_TABLET | Freq: Two times a day (BID) | ORAL | Status: DC
  Administered 2024-12-27 – 2024-12-30 (×6): 1 via ORAL
  Filled 2024-12-27 (×7): qty 1

## 2024-12-27 MED ORDER — CARVEDILOL 6.25 MG PO TABS
6.2500 mg | ORAL_TABLET | Freq: Two times a day (BID) | ORAL | Status: DC
  Administered 2024-12-27 – 2025-01-07 (×23): 6.25 mg via ORAL
  Filled 2024-12-27 (×10): qty 1
  Filled 2024-12-27 (×2): qty 2
  Filled 2024-12-27 (×9): qty 1
  Filled 2024-12-27: qty 2
  Filled 2024-12-27: qty 1

## 2024-12-27 MED ORDER — TRAZODONE HCL 50 MG PO TABS
25.0000 mg | ORAL_TABLET | Freq: Every evening | ORAL | Status: DC | PRN
  Administered 2024-12-27 – 2025-01-08 (×12): 25 mg via ORAL
  Filled 2024-12-27 (×12): qty 1

## 2024-12-27 MED ORDER — HYDRALAZINE HCL 20 MG/ML IJ SOLN: 10.0000 mg | Freq: Four times a day (QID) | INTRAMUSCULAR | Status: DC | PRN

## 2024-12-27 MED ORDER — PRAVASTATIN SODIUM 10 MG PO TABS
10.0000 mg | ORAL_TABLET | Freq: Every day | ORAL | Status: DC
  Administered 2024-12-27 – 2025-01-09 (×14): 10 mg via ORAL
  Filled 2024-12-27 (×15): qty 1

## 2024-12-27 MED ORDER — SODIUM CHLORIDE 0.9 % IV SOLN
2.0000 g | Freq: Three times a day (TID) | INTRAVENOUS | Status: DC
  Administered 2024-12-27: 2 g via INTRAVENOUS
  Filled 2024-12-27: qty 2000

## 2024-12-27 NOTE — TOC Progression Note (Signed)
 Transition of Care [facility]) - Progression Note    Patient Details  Name: KAIPO ARDIS MRN: [id number] Date of Birth: 1946-11-17  Transition of Care [facility]) CM/SW Contact  Lauraine FORBES Saa, LCSWA Phone Number: 12/27/2024, 3:14 PM  Clinical Narrative:     3:14 PM CSW sent patient's FL2 to [facility] SN[facility]. CSW will continue to follow.  Expected Discharge Plan: Skilled Nursing Facility Barriers to Discharge: Continued Medical Work up, English As A Second Language Teacher               Expected Discharge Plan and Services In-house Referral: Clinical Social Work     Living arrangements for the past 2 months: Single Family Home, Skilled Nursing Facility                                       Social Drivers of Health (SDOH) Interventions SDOH Screenings   Food Insecurity: Unknown (12/26/2024)  Housing: Unknown (12/26/2024)  Transportation Needs: Unknown (12/26/2024)  Utilities: Patient Unable To Answer (12/26/2024)  Depression (PHQ2-9): High Risk (07/26/2023)  Social Connections: Unknown (12/26/2024)  Tobacco Use: Medium Risk (12/25/2024)    Readmission Risk Interventions    12/10/2024    9:44 AM 02/11/2023   12:02 PM  Readmission Risk Prevention Plan  Medication Screening Complete   Transportation Screening Complete Complete  PCP or Specialist Appt within 5-7 Days  Complete  Home Care Screening  Complete  Medication Review (RN CM)  Complete

## 2024-12-27 NOTE — Evaluation (Signed)
 Physical Therapy Evaluation Patient Details Name: Zachary [name] MRN: [id number] DOB: 1946/07/20 Today's Date: 12/27/2024  History of Present Illness  Pt is 78 yo presenting to [facility] on 12/25/24 due to AMS, hypotension, tachycardia, and hypoxia, sent from rehab facility. Pt recently hospitalized for mechanical fall with resultant T9 fx s/p PCDF T7-11 on 12/13/24. PMHx: HLD, HTN, CKD, bladder CA, DM, OSA.  Clinical Impression  Pt is currently presenting at 2 person Max A for bed mobility, 2 person Mod A for sit to stand and was able to attempt taking side steps at EOB. Pt is currently limited by pain and mentation with difficulty sequencing movement. Due to pt current functional status, home set up and available assistance at home recommending skilled physical therapy services < 3 hours/day in order to address strength, balance and functional mobility to decrease risk for falls, injury, immobility, skin break down and re-hospitalization.          If plan is discharge home, recommend the following: Assistance with cooking/housework;Assist for transportation;Help with stairs or ramp for entrance;Two people to help with walking and/or transfers   Can travel by private vehicle   No    Equipment Recommendations Wheelchair (measurements PT);Wheelchair cushion (measurements PT);BSC/3in1;Hoyer lift;Hospital bed     Functional Status Assessment Patient has had a recent decline in their functional status and demonstrates the ability to make significant improvements in function in a reasonable and predictable amount of time.     Precautions / Restrictions Precautions Precautions: Fall;Back Precaution Booklet Issued: No Recall of Precautions/Restrictions: Impaired Precaution/Restrictions Comments: TLSO not in room Restrictions Weight Bearing Restrictions Per Provider Order: No      Mobility  Bed Mobility Overal bed mobility: Needs Assistance Bed Mobility: Rolling, Sidelying to Sit, Sit to  Sidelying Rolling: Max assist, +2 for physical assistance, +2 for safety/equipment Sidelying to sit: Max assist, +2 for physical assistance, +2 for safety/equipment     Sit to sidelying: Max assist, +2 for physical assistance, +2 for safety/equipment General bed mobility comments: Multi modal cues with Max A at trunk and LE and Max A to initiate movement for LE initially toward EOB to get to midline and to get LE to EOB    Transfers Overall transfer level: Needs assistance Equipment used: 2 person hand held assist Transfers: Sit to/from Stand Sit to Stand: Mod assist, +2 safety/equipment, +2 physical assistance           General transfer comment: 2 person Mod A with assist with bed pad to get fully to standing with slightly crouched positioning. Attempted side stepping with heavy cues for wgt shifting and stepping with 2 very small side steps before pt initiating sitting back on EOB.    Ambulation/Gait       Pre-gait activities: pt took 2 very small side steps at EOB with 2 person Mod A with heavy multi modal cues for sequencing/wgt shifting/stepping.       Balance Overall balance assessment: Needs assistance Sitting-balance support: Single extremity supported, Bilateral upper extremity supported, Feet supported Sitting balance-Leahy Scale: Poor Sitting balance - Comments: reliant on UE support, occasional Min A, pt able to sit CGA most of the time with brief periods of SBA Postural control: Posterior lean Standing balance support: Bilateral upper extremity supported Standing balance-Leahy Scale: Poor Standing balance comment: External support.       Pertinent Vitals/Pain Pain Assessment Pain Assessment: Faces Faces Pain Scale: Hurts whole lot Facial Expression: Tense Body Movements: Restlessness Muscle Tension: Tense, rigid Compliance with ventilator (intubated  pts.): N/A Vocalization (extubated pts.): Sighing, moaning CPOT Total: 5 Pain Location: back Pain  Descriptors / Indicators: Sharp Pain Intervention(s): Monitored during session, Limited activity within patient's tolerance, Repositioned    Home Living Family/patient expects to be discharged to:: Skilled nursing facility Living Arrangements: Spouse/significant other Available Help at Discharge: Family;Available 24 hours/day Type of Home: House Home Access: Ramped entrance       Home Layout: One level Home Equipment: Tub bench;Rolling Walker (2 wheels);Lift chair;Hand held shower head      Prior Function Prior Level of Function : Needs assist             Mobility Comments: working on short distance gait and transfers at SNF prior to recent hospitalization. Pt mostly using WC for mobility ADLs Comments: assist with all ADLs at [facility]     Extremity/Trunk Assessment   Upper Extremity Assessment Upper Extremity Assessment: Defer to OT evaluation    Lower Extremity Assessment Lower Extremity Assessment: Generalized weakness;Difficult to assess due to impaired cognition RLE Deficits / Details: 2+/5 LE strength in sitting LLE Deficits / Details: 2+/5 LE strength in sitting    Cervical / Trunk Assessment Cervical / Trunk Assessment: Back Surgery ([facility] on 12/17)  Communication   Communication Communication: Impaired Factors Affecting Communication: Hearing impaired    Cognition Arousal: Alert Behavior During Therapy: Anxious, Flat affect   PT - Cognitive impairments: History of cognitive impairments     Following commands: Impaired Following commands impaired: Follows one step commands with increased time, Follows one step commands inconsistently     Cueing Cueing Techniques: Verbal cues, Visual cues, Tactile cues     General Comments General comments (skin integrity, edema, etc.): Vital signs remained WNL throughout session; spouse present and supportive.        Assessment/Plan    PT Assessment Patient needs continued PT services  PT Problem List  Decreased strength;Decreased range of motion;Decreased activity tolerance;Decreased balance;Decreased mobility;Decreased cognition;Decreased knowledge of use of DME;Decreased safety awareness;Decreased knowledge of precautions;Pain       PT Treatment Interventions DME instruction;Gait training;Stair training;Functional mobility training;Therapeutic activities;Therapeutic exercise;Balance training;Cognitive remediation;Neuromuscular re-education;Patient/family education    PT Goals (Current goals can be found in the Care Plan section)  Acute Rehab PT Goals Patient Stated Goal: decrease pain PT Goal Formulation: With patient/family Time For Goal Achievement: 01/10/25 Potential to Achieve Goals: Fair    Frequency Min 2X/week     Co-evaluation PT/OT/SLP Co-Evaluation/Treatment: Yes Reason for Co-Treatment: For patient/therapist safety;To address functional/ADL transfers;Necessary to address cognition/behavior during functional activity PT goals addressed during session: Mobility/safety with mobility;Balance OT goals addressed during session: Strengthening/ROM       AM-PAC PT 6 Clicks Mobility  Outcome Measure Help needed turning from your back to your side while in a flat bed without using bedrails?: Total Help needed moving from lying on your back to sitting on the side of a flat bed without using bedrails?: Total Help needed moving to and from a bed to a chair (including a wheelchair)?: Total Help needed standing up from a chair using your arms (e.g., wheelchair or bedside chair)?: Total Help needed to walk in hospital room?: Total Help needed climbing 3-5 steps with a railing? : Total 6 Click Score: 6    End of Session   Activity Tolerance: Patient limited by pain Patient left: in bed;with call bell/phone within reach;with bed alarm set Nurse Communication: Mobility status;Need for lift equipment PT Visit Diagnosis: Other abnormalities of gait and mobility (R26.89);Muscle  weakness (generalized) (M62.81);Pain Pain -  Right/Left:  (back)    Time: 8580-8550 PT Time Calculation (min) (ACUTE ONLY): 30 min   Charges:   PT Evaluation $PT Eval Low Complexity: 1 Low   PT General Charges $$ ACUTE PT VISIT: 1 Visit         Dorothyann Maier, DPT, CLT  Acute Rehabilitation Services Office: [phone] (Secure chat preferred)   Dorothyann VEAR Maier 12/27/2024, 3:02 PM

## 2024-12-27 NOTE — Evaluation (Signed)
 Occupational Therapy Evaluation Patient Details Name: Zachary [name] MRN: [id number] DOB: 1946-08-11 Today's Date: 12/27/2024   History of Present Illness   Pt is 78 yo presenting to [facility] on 12/25/24 due to AMS, hypotension, tachycardia, and hypoxia, sent from rehab facility. Pt recently hospitalized for mechanical fall with resultant T9 fx s/p PCDF T7-11 on 12/13/24. PMHx: HLD, HTN, CKD, bladder CA, DM, OSA.     Clinical Impressions Pt seen this PM for OT evaluation, seen in ICU. Pt admitted from SNF (recently discharged to rehab following fall and back surgery on 12/17). He has been receiving PT and OT services and working on ADLs/mobility primarily in a w/c. Wife reports significant anxiety of falling since. He presents today with ? Hypoactive delirium, poor pain tolerance, and generalized weakness, requiring total A for LB ADLs and CGA for seated UB ADL. Mod A +2 to stand and attempt side stepping to [facility]. Limited by pain and fear of falling. VSS on RA.   Pt is currently functioning below baseline and would benefit from ongoing acute OT services to progress towards safe discharge and to facilitate return to prior level of function. Current recommendation is post-acute rehab (< 3 hours/day).     If plan is discharge home, recommend the following:   Two people to help with walking and/or transfers;Two people to help with bathing/dressing/bathroom;Assistance with cooking/housework;Direct supervision/assist for medications management;Direct supervision/assist for financial management;Assist for transportation;Help with stairs or ramp for entrance;Supervision due to cognitive status     Functional Status Assessment   Patient has had a recent decline in their functional status and demonstrates the ability to make significant improvements in function in a reasonable and predictable amount of time.     Equipment Recommendations   Other (comment) (defer to next level of care)      Recommendations for Other Services         Precautions/Restrictions   Precautions Precautions: Fall;Back Precaution Booklet Issued: No Recall of Precautions/Restrictions: Impaired Precaution/Restrictions Comments: TLSO not in hospital room at time of eval Required Braces or Orthoses: Spinal Brace Spinal Brace: Thoracolumbosacral orthotic Restrictions Weight Bearing Restrictions Per Provider Order: No     Mobility Bed Mobility Overal bed mobility: Needs Assistance Bed Mobility: Rolling, Sidelying to Sit, Sit to Sidelying Rolling: Max assist, +2 for physical assistance, +2 for safety/equipment Sidelying to sit: Max assist, +2 for physical assistance, +2 for safety/equipment     Sit to sidelying: Max assist, +2 for physical assistance, +2 for safety/equipment General bed mobility comments: assist for trunk elevation and LE mgmt, cues for log roll technique    Transfers Overall transfer level: Needs assistance Equipment used: 2 person hand held assist Transfers: Sit to/from Stand Sit to Stand: Mod assist, +2 safety/equipment, +2 physical assistance           General transfer comment: stood from ICU bed with cues for powering up and sequencing, pt with poor upright posture, cued for forward gaze      Balance Overall balance assessment: Needs assistance Sitting-balance support: Single extremity supported, Bilateral upper extremity supported, Feet supported Sitting balance-Leahy Scale: Poor Sitting balance - Comments: occasionally min A to maintain sitting balance EOB, mostly CGA, cued for UE support to maintain forward lean Postural control: Posterior lean Standing balance support: Bilateral upper extremity supported Standing balance-Leahy Scale: Poor Standing balance comment: reliant on external support  ADL either performed or assessed with clinical judgement   ADL Overall ADL's : Needs assistance/impaired Eating/Feeding:  Set up;Bed level   Grooming: Wash/dry face;Contact guard assist;Sitting Grooming Details (indicate cue type and reason): benefits from encouragement to complete         Upper Body Dressing : Maximal assistance   Lower Body Dressing: Total assistance;Sitting/lateral leans               Functional mobility during ADLs: Moderate assistance;+2 for physical assistance       Vision         Perception         Praxis         Pertinent Vitals/Pain Pain Assessment Pain Assessment: Faces Faces Pain Scale: Hurts whole lot Pain Location: back Pain Descriptors / Indicators: Discomfort, Grimacing Pain Intervention(s): Limited activity within patient's tolerance, Monitored during session, Repositioned, Other (comment) (therapeutic presence)     Extremity/Trunk Assessment Upper Extremity Assessment Upper Extremity Assessment: Generalized weakness   Lower Extremity Assessment Lower Extremity Assessment: Defer to PT evaluation RLE Deficits / Details: 2+/5 LE strength in sitting LLE Deficits / Details: 2+/5 LE strength in sitting   Cervical / Trunk Assessment Cervical / Trunk Assessment: Back Surgery (T7-11 fusion 12/13/24)   Communication Communication Communication: Impaired Factors Affecting Communication: Hearing impaired (mildly HoH)   Cognition Arousal: Alert Behavior During Therapy: Anxious, Flat affect Cognition: History of cognitive impairments (mild dementia per spouse)             OT - Cognition Comments: pt very labile and with tangential demands; perseverative on my back hurts and I'm in pain ladies and I can't do it and I'm so weak, help me!                 Following commands: Impaired Following commands impaired: Follows one step commands with increased time, Follows one step commands inconsistently     Cueing  General Comments   Cueing Techniques: Verbal cues;Visual cues;Tactile cues  supportive spouse Jethro) present  throughout   Exercises     Shoulder Instructions      Home Living Family/patient expects to be discharged to:: Skilled nursing facility Living Arrangements: Spouse/significant other Available Help at Discharge: Family;Available 24 hours/day Type of Home: House Home Access: Ramped entrance     Home Layout: One level     Bathroom Shower/Tub: Chief Strategy Officer: Handicapped height     Home Equipment: Tub bench;Rolling Walker (2 wheels);Lift chair;Hand held shower head   Additional Comments: Pt admitted to STR facility following fall and spinal sx ~2 weeks ago and had been working with PT/OT there. Wife reports pt was working on standing and taking few steps to a wheelchair.      Prior Functioning/Environment Prior Level of Function : Needs assist       Physical Assist : Mobility (physical);ADLs (physical) Mobility (physical): Bed mobility;Transfers;Gait ADLs (physical): Bathing;Dressing;Toileting;IADLs Mobility Comments: was working on standing and pivoting to w/c and taking few steps with RW at SNF ADLs Comments: had assistance with ADLs at [facility]    OT Problem List: Decreased strength;Decreased activity tolerance;Impaired balance (sitting and/or standing);Decreased cognition;Decreased safety awareness;Decreased knowledge of use of DME or AE;Decreased knowledge of precautions;Cardiopulmonary status limiting activity;Pain   OT Treatment/Interventions: Self-care/ADL training;Therapeutic exercise;Energy conservation;DME and/or AE instruction;Therapeutic activities;Patient/family education;Balance training      OT Goals(Current goals can be found in the care plan section)   Acute Rehab OT Goals Patient Stated Goal: lay back down OT  Goal Formulation: With patient/family Time For Goal Achievement: 01/10/25 Potential to Achieve Goals: Good   OT Frequency:  Min 1X/week    Co-evaluation PT/OT/SLP Co-Evaluation/Treatment: Yes Reason for Co-Treatment:  For patient/therapist safety;To address functional/ADL transfers;Necessary to address cognition/behavior during functional activity PT goals addressed during session: Mobility/safety with mobility;Balance OT goals addressed during session: ADL's and self-care      AM-PAC OT 6 Clicks Daily Activity     Outcome Measure Help from another person eating meals?: A Little Help from another person taking care of personal grooming?: A Little Help from another person toileting, which includes using toliet, bedpan, or urinal?: Total Help from another person bathing (including washing, rinsing, drying)?: A Lot Help from another person to put on and taking off regular upper body clothing?: A Lot Help from another person to put on and taking off regular lower body clothing?: Total 6 Click Score: 12   End of Session Nurse Communication: Mobility status  Activity Tolerance: Patient limited by pain;Other (comment) (and anxiety) Patient left: in bed;with call bell/phone within reach;with bed alarm set  OT Visit Diagnosis: Unsteadiness on feet (R26.81);Other abnormalities of gait and mobility (R26.89);Pain Pain - part of body:  (back)                Time: 8577-8554 OT Time Calculation (min): 23 min Charges:  OT General Charges $OT Visit: 1 Visit OT Evaluation $OT Eval Moderate Complexity: 1 Mod  Jadaya Sommerfield M. [place], OTR/L [facility] Acute Rehabilitation Services [phone] Secure Chat Preferred  Rendy Lazard 12/27/2024, 3:41 PM

## 2024-12-27 NOTE — Progress Notes (Addendum)
 "  NAME:  Zachary [name], MRN:  [id number], DOB:  07-Feb-1946, LOS: 2 ADMISSION DATE:  12/25/2024, CONSULTATION DATE:  12/25/24 REFERRING MD:  Dr. Melvenia, CHIEF COMPLAINT:  AMS   History of Present Illness:   80 yoM with PMH as below significant for HTN, HLD, CKD 3b, bladder cancer s/p multiple surgeries and TURBT (12/2022), low-grade papillary urethral carcinoma, urinary retention requiring intermittent catheterizations 3-4x/ day, pre-DM, OSA, chronic pain, nephrolithiasis, anxiety, and depression.   Recent hospitalization with T9 vertebral fracture after mechanical fall s/p posterior decompression and fusion from T7-T11 on 12/17 with Dr. Onetha discharged to [facility] rehab 12/23.  Sent from rehab with altered mental status, hypotension, tachycardia, and hypoxia.  At baseline, ambulatory and conversant.  Treated with EMS with IM rocephin , 1L LR, and NRB.  Wife reported worsening mentation for 3 days.   In ER, febrile 102.4, tachycardic 118, NRB weaned to [place] 6L and persistent hypotension requiring norepinephrine  despite additional 2L LR, oriented to self, MAE and following commands. Bladder scan with > 1L in bladder despite foley placement which subsequently was exchanged with large volume purulent drainage.    Labs notable for BUN/ sCr 89/ 3.9 (most recently 24/ 1.59 on 12/23), bicarb 16, alk phos 257, albumin  2.8, protein 6.4, pBNP 2105, trop 46, lactic acid 2.7> 2.3, WBC 12.6, Hgb 12.8, INR 1.3, VBG 7.43/ 34/ 50/ 22, UA turbid, mod leukocytes, small Hgb otherwise neg nitrates, no bacteria, RBC 0-5, WBC 0-5, CXR no acute cardiopulmonary process, CT head NAICA, CT chest/ abd/ pelvis wo contrast showing moderate right sided hydronephrosis due to 4mm distal right ureteral stone without perinephric or periureteral stranding, moderately distended gallbladder without visible stones or inflammatory changes, small bilateral pleural effusions with bibasilar atelectasis.  Blood and urine cultures sent, stated on  cefepime , flagyl , and vancomycin .  Right femoral CVL placed in ER .  To be transferred to [facility] for further ICU evaluation and management.   On arrival, pt alert and oriented x 3.  Complaining of midline back pain, denies flank pain.  Also c/o of mild umbilical pain, unsure when last BM was but charted type 6/ mushy in ER.  Denies SOB, CP, N/V, or lower leg pain or swelling.   Pertinent  Medical History   Past Medical History:  Diagnosis Date   Anxiety    takes Valium  as needed   Arthritis    Bladder cancer (HCC)    takes Rapaflo daily   Blood dyscrasia    07/08/16: pt told he was a free bleeder after bleeding a lot when derm cut off mole on forehead. No excessive bleeding with minor wounds at home, no bleeding problems perioperatively with prior surgeries.   Chronic back pain    spondylolisthesis   Cluster headaches    Depression    takes Lexapro  daily   Essential hypertension, benign    takes Diovan -HCT daily   Hearing loss    hearing aids   History of kidney stones    Hx of complications due to general anesthesia    Aborted surgery 07/15/2016 due to hypotension per pt   Intertrochanteric fracture of left femur (HCC) 02/09/2023   Joint pain    Kidney stone 03/2019   passed stone   Obstructive sleep apnea on CPAP    uses CPAP @ night   Parkinsonism (HCC)    Pneumonia    several times--last time about 3-4 yrs ago   Pre-diabetes    Prediabetes    Syncopal episodes  Thyroid  condition   Former smoker  Significant Hospital Events: Including procedures, antibiotic start and stop dates in addition to other pertinent events   12/29 tx from [facility] [place] [facility], septic shock 12/29-OR for cystoscopy, right ureteral stent placement, retrograde pyelography 12/30: weaned off vasopressors in the early morning, agitated in the evening requiring haldol    Interim History / Subjective:  Hemodynamically stable on room air. Received 0.5 mg haldol  for agitation yesterday evening. Confused this  morning but oriented x 4. Complains of pain all over.   Objective    Blood pressure (!) 161/87, pulse 78, temperature 98.2 F (36.8 C), resp. rate 12, SpO2 97%.        Intake/Output Summary (Last 24 hours) at 12/27/2024 1005 Last data filed at 12/27/2024 0930 Gross per 24 hour  Intake 297.89 ml  Output 2875 ml  Net -2577.11 ml   There were no vitals filed for this visit.  Examination: General: elderly male, NAD HEENT: [place]/AT, sclera anicteric  Neuro: alert and oriented x 4 but intermittently confused, follows commands x 4 with no focal deficits CV: RRR, normal S1 and S2 no m/r/g  PULM: breathing comfortably on room air, clear/diminished bilaterally  GI: soft, non-tender, non-distended, +BS Extremities: warm, dry, no edema  Labs/imaging in chart reviewed   Resolved problem list   Assessment and Plan   Septic shock, urosepsis: in the setting of right ureteral stone with pyonephrosis s/p stenting - now off pressors. Urine culture with E. Coli and enterococcus faecalis.  - Continue ceftriaxone  and add ampicillin for enterococcus coverage, follow-up sensitivities and narrow as able - Follow-up renal pelvis culture 12/29 - Urology following, will need ureteroscopy in 2-3 weeks   OSA on CPAP - Continue nightly CPAP - Encourage IS, pulmonary hygiene   Metabolic encephalopathy, improving suspect now mostly delirium; CTH negative  - Continue supportive measures, delirium precautions  - Add melatonin, PRN trazodone   Acute kidney injury on chronic kidney disease stage IIIb; likely due to septic shock and obstructive uropathy  Hydronephrosis Urinary retention s/p chronic foley  History of bladder cancer  - Continue foley  - S/p cystoscopy, ureteral stent placement; needs repeat ureteroscopy in 2-3 weeks and had bladder wall findings consistent with possible carcinoma in situ of the bladder and will need bladder biopsy at the same time  - Continue to trend BMP - Avoid  nephrotoxic medications, renally dose medications, ensure adequate renal perfusion   Hypertension; Echo with EF 50-55%, G1DD, normal RV - Resume home coreg   - Holding home valsartan  with AKI  History of HLD - Resume home statin  Protein calorie malnutrition - Continue regular diet with nectar thick liquids  T9 vertebral fracture s/p posterior decompression and fusion from T7-T11 on 12/17 with Dr. Arlyss, site looks ok  Chronic pain  - PT/OT - PRN APAP, PRN Robaxin , PRN oxycodone  (reduced from home dose). Discontinue IV HM  Anxiety/depression - Resume home Cymbalta    Left heel DTPI Stage 1 sacrum pressure injury - WOC consult  GOC - previously DNR, arrived with [place] DNR form.  Wife updated by phone by Dr. Theodoro.  Ok with intubation, pressors.   Dispo: will transition to the floor and request TRH assume care 1/1. PCCM will continue to be available PRN.   Labs   CBC: Recent Labs  Lab 12/25/24 0922 12/26/24 0349 12/27/24 0354  WBC 12.6* 33.4* 28.9*  NEUTROABS 12.1*  --   --   HGB 12.8* 9.8* 10.4*  HCT 39.7 30.5* 31.5*  MCV  88.4 89.2 85.8  PLT 392 297 333    Basic Metabolic Panel: Recent Labs  Lab 12/25/24 0922 12/26/24 0349  NA 138 137  K 4.2 4.7  CL 103 103  CO2 16* 21*  GLUCOSE 82 140*  BUN 89* 87*  CREATININE 3.90* 3.76*  CALCIUM 8.3* 8.1*  MG 2.1 2.0  PHOS  --  5.0*   GFR: Estimated Creatinine Clearance: 18.6 mL/min (A) (by C-G formula based on SCr of 3.76 mg/dL (H)). Recent Labs  Lab 12/25/24 0922 12/25/24 1115 12/26/24 0349 12/27/24 0354  WBC 12.6*  --  33.4* 28.9*  LATICACIDVEN 2.7* 2.3*  --   --     Liver Function Tests: Recent Labs  Lab 12/25/24 0922 12/26/24 0349  AST 30 23  ALT 18 15  ALKPHOS 257* 171*  BILITOT 0.5 0.3  PROT 6.4* 5.9*  ALBUMIN  2.8* 2.6*   No results for input(s): LIPASE, AMYLASE in the last 168 hours. No results for input(s): AMMONIA in the last 168 hours.  ABG    Component Value Date/Time    PHART 7.374 12/13/2024 1620   PCO2ART 42.1 12/13/2024 1620   PO2ART 139 (H) 12/13/2024 1620   HCO3 22.0 12/25/2024 1008   TCO2 26 12/13/2024 1620   ACIDBASEDEF 0.9 12/25/2024 1008   O2SAT 76.3 12/25/2024 1008     Coagulation Profile: Recent Labs  Lab 12/25/24 0922 12/26/24 0349  INR 1.3* 1.3*    Cardiac Enzymes: No results for input(s): CKTOTAL, CKMB, CKMBINDEX, TROPONINI in the last 168 hours.  HbA1C: Hgb A1c MFr Bld  Date/Time Value Ref Range Status  12/01/2022 09:27 AM 6.3 4.6 - 6.5 % Final    Comment:    Glycemic Control Guidelines for People with Diabetes:Non Diabetic:  <6%Goal of Therapy: <7%Additional Action Suggested:  >8%   07/22/2016 07:00 AM 5.8 (H) 4.8 - 5.6 % Final    Comment:    (NOTE)         Pre-diabetes: 5.7 - 6.4         Diabetes: >6.4         Glycemic control for adults with diabetes: <7.0     CBG: Recent Labs  Lab 12/26/24 1152 12/26/24 1551 12/26/24 2348 12/27/24 0331 12/27/24 0810  GLUCAP 100* 139* 132* 106* 111*   Signature Rexene LOISE Blush, [place] [facility] 12/27/2024 10:05 AM  Please see [email or web address] for pager details.  From 7A-7P if no response, please call [phone] After hours, please call ELink [phone]   "

## 2024-12-27 NOTE — NC FL2 (Signed)
 " [place]  MEDICAID FL2 LEVEL OF CARE FORM     IDENTIFICATION  Patient Name: Zachary [name] Birthdate: [birth year] Sex: male Admission Date (Current Location): 12/25/2024  [place] and [place] Number:  [employer] and Address:  [facility], [address], [place], [place] [address]      Provider Number: [id number]  Attending Physician Name and Address:  Neda Jennet LABOR, MD  Relative Name and Phone Number:  Cassidy, Tabet Spouse [phone]    Current Level of Care: Hospital Recommended Level of Care: Skilled Nursing Facility Prior Approval Number:    Date Approved/Denied:   PASRR Number: [id number]  Discharge Plan: SNF    Current Diagnoses: Patient Active Problem List   Diagnosis Date Noted   Acute pyonephrosis 12/27/2024   AKI (acute kidney injury) 12/27/2024   Septic shock (HCC) 12/25/2024   ABLA (acute blood loss anemia) 12/20/2024   Depression with anxiety 12/20/2024   Increased intraocular pressure, bilateral 12/20/2024   Unspecified protein-calorie malnutrition 12/20/2024   Neurocognitive deficits 12/20/2024   T9 vertebral fracture (HCC) 12/10/2024   Chronic kidney disease (CKD), stage III (moderate) (HCC) 12/10/2024   Neuropathy 04/12/2024   Subclinical hyperthyroidism 09/20/2018   Prediabetes 09/20/2018   S/P total knee replacement, 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 02/07/2018   Primary osteoarthritis of left knee 02/01/2018   Spinal stenosis 04/12/2017   Dysphagia 07/23/2016   Obstructive sleep apnea 07/21/2016   Hyperglycemia 07/21/2016   Spinal stenosis of lumbar region 07/15/2016   Bladder tumor 07/27/2015   Epiretinal membrane (ERM) of left eye 03/14/2014   H/O retinal detachment 03/14/2014   Pseudophakia of both eyes 03/14/2014   Retinal detachment 03/14/2014   Visual distortion 03/14/2014   HNP (herniated nucleus pulposus), cervical 06/05/2013   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 anxiety  03/01/2013   Insomnia 03/01/2013   Iliotibial band syndrome of left side 11/22/2012   Difficulty walking 08/17/2012   Essential hypertension, benign 02/09/2012   Mixed hyperlipidemia 02/09/2012    Orientation RESPIRATION BLADDER Height & Weight     Self, Situation, Time, Place  O2 (4L nasal cannula) Continent, Indwelling catheter Weight: 197 lb 1.5 oz (89.4 kg) Height:     BEHAVIORAL SYMPTOMS/MOOD NEUROLOGICAL BOWEL NUTRITION STATUS      Incontinent Diet (Please see discharge summary)  AMBULATORY STATUS COMMUNICATION OF NEEDS Skin   Extensive Assist Verbally PU Stage and Appropriate Care (Pressure Injury Coccyx Right Stage 1 and Heel Left Deep Tissue Pressure Injury)                       Personal Care Assistance Level of Assistance  Bathing, Feeding, Dressing Bathing Assistance: Maximum assistance Feeding assistance: Maximum assistance Dressing Assistance: Maximum assistance Total Care Assistance: Maximum assistance   Functional Limitations Info  Sight Sight Info: Impaired (R and L (Eyeglasses))        SPECIAL CARE FACTORS FREQUENCY  PT (By licensed PT), OT (By licensed OT), Speech therapy     PT Frequency: 5x OT Frequency: 5x     Speech Therapy Frequency: 3x      Contractures Contractures Info: Not present    Additional Factors Info  Code Status, Allergies Code Status Info: DNR PRE-ARREST INTERVENTIONS DESIRED Allergies Info: Poison Oak Extract, Sulfa Antibiotics, Sulfonamide Derivatives           Current Medications (12/27/2024):  This is the current hospital active medication list Current Facility-Administered Medications  Medication Dose Route Frequency Provider Last  Rate Last Admin   acetaminophen  (TYLENOL ) tablet 650 mg  650 mg Oral Q6H PRN Wilson, Tara N, PA-C       amoxicillin-clavulanate (AUGMENTIN) 500-125 MG per tablet 1 tablet  1 tablet Oral BID Olalere, Adewale A, MD       bisacodyl  (DULCOLAX) suppository 10 mg  10 mg Rectal Daily PRN  Simpson, Paula B, NP       carvedilol  (COREG ) tablet 6.25 mg  6.25 mg Oral BID Tanda, Tara N, PA-C   6.25 mg at 12/27/24 1052   Chlorhexidine  Gluconate Cloth 2 % PADS 6 each  6 each Topical Daily Antonetta Moccasin B, NP   6 each at 12/26/24 1031   dorzolamide-timolol (COSOPT) 2-0.5 % ophthalmic solution 1 drop  1 drop Both Eyes BID Wilson, Tara N, PA-C   1 drop at 12/27/24 1050   DULoxetine  (CYMBALTA ) DR capsule 60 mg  60 mg Oral BID Wilson, Tara N, PA-C   60 mg at 12/27/24 1052   heparin  injection 5,000 Units  5,000 Units Subcutaneous Q8H Simpson, Paula B, NP   5,000 Units at 12/27/24 1502   hydrALAZINE  (APRESOLINE ) injection 10 mg  10 mg Intravenous Q6H PRN Wilson, Tara N, PA-C       latanoprost (XALATAN) 0.005 % ophthalmic solution 1 drop  1 drop Both Eyes QHS Wilson, Tara N, PA-C       lidocaine  (LIDODERM ) 5 % 1 patch  1 patch Transdermal Q24H Simpson, Paula B, NP   1 patch at 12/26/24 2044   lidocaine  (PF) (XYLOCAINE ) 1 % injection 5 mL  5 mL   Dixon, Ryan, MD   5 mL at 12/25/24 0920   melatonin tablet 3 mg  3 mg Oral QHS Wilson, Tara N, PA-C       methocarbamol  (ROBAXIN ) injection 500 mg  500 mg Intravenous Q6H PRN Simpson, Paula B, NP       oxyCODONE  (Oxy IR/ROXICODONE ) immediate release tablet 5 mg  5 mg Oral Q6H PRN Wilson, Tara N, PA-C   5 mg at 12/27/24 1502   polyethylene glycol (MIRALAX  / GLYCOLAX ) packet 17 g  17 g Oral Daily Simpson, Paula B, NP   17 g at 12/27/24 0935   pravastatin  (PRAVACHOL ) tablet 10 mg  10 mg Oral QHS Wilson, Tara N, PA-C       traZODone (DESYREL) tablet 25 mg  25 mg Oral QHS PRN Wilson, Tara N, PA-C         Discharge Medications: Please see discharge summary for a list of discharge medications.  Relevant Imaging Results:  Relevant Lab Results:   Additional Information SSN: [id number]  Hendrix Console E Jakai Onofre, LCSWA     "

## 2024-12-28 DIAGNOSIS — R6521 Severe sepsis with septic shock: Secondary | ICD-10-CM | POA: Diagnosis not present

## 2024-12-28 DIAGNOSIS — A419 Sepsis, unspecified organism: Secondary | ICD-10-CM | POA: Diagnosis not present

## 2024-12-28 LAB — BASIC METABOLIC PANEL WITH GFR
Anion gap: 13 (ref 5–15)
BUN: 88 mg/dL — ABNORMAL HIGH (ref 8–23)
CO2: 21 mmol/L — ABNORMAL LOW (ref 22–32)
Calcium: 8.8 mg/dL — ABNORMAL LOW (ref 8.9–10.3)
Chloride: 109 mmol/L (ref 98–111)
Creatinine, Ser: 2.83 mg/dL — ABNORMAL HIGH (ref 0.61–1.24)
GFR, Estimated: 22 mL/min — ABNORMAL LOW
Glucose, Bld: 98 mg/dL (ref 70–99)
Potassium: 4.7 mmol/L (ref 3.5–5.1)
Sodium: 143 mmol/L (ref 135–145)

## 2024-12-28 LAB — GLUCOSE, CAPILLARY
Glucose-Capillary: 101 mg/dL — ABNORMAL HIGH (ref 70–99)
Glucose-Capillary: 102 mg/dL — ABNORMAL HIGH (ref 70–99)
Glucose-Capillary: 103 mg/dL — ABNORMAL HIGH (ref 70–99)
Glucose-Capillary: 103 mg/dL — ABNORMAL HIGH (ref 70–99)
Glucose-Capillary: 83 mg/dL (ref 70–99)
Glucose-Capillary: 90 mg/dL (ref 70–99)
Glucose-Capillary: 97 mg/dL (ref 70–99)

## 2024-12-28 LAB — CBC
HCT: 33.7 % — ABNORMAL LOW (ref 39.0–52.0)
Hemoglobin: 10.9 g/dL — ABNORMAL LOW (ref 13.0–17.0)
MCH: 27.9 pg (ref 26.0–34.0)
MCHC: 32.3 g/dL (ref 30.0–36.0)
MCV: 86.4 fL (ref 80.0–100.0)
Platelets: 330 K/uL (ref 150–400)
RBC: 3.9 MIL/uL — ABNORMAL LOW (ref 4.22–5.81)
RDW: 14.9 % (ref 11.5–15.5)
WBC: 21.2 K/uL — ABNORMAL HIGH (ref 4.0–10.5)
nRBC: 0.1 % (ref 0.0–0.2)

## 2024-12-28 LAB — MAGNESIUM: Magnesium: 2.1 mg/dL (ref 1.7–2.4)

## 2024-12-28 MED ORDER — HYDRALAZINE HCL 20 MG/ML IJ SOLN: 10.0000 mg | INTRAMUSCULAR | Status: DC | PRN

## 2024-12-28 MED ORDER — IPRATROPIUM-ALBUTEROL 0.5-2.5 (3) MG/3ML IN SOLN: 3.0000 mL | RESPIRATORY_TRACT | Status: DC | PRN

## 2024-12-28 MED ORDER — METOPROLOL TARTRATE 5 MG/5ML IV SOLN: 5.0000 mg | INTRAVENOUS | Status: DC | PRN

## 2024-12-28 MED ORDER — GLUCAGON HCL RDNA (DIAGNOSTIC) 1 MG IJ SOLR: 1.0000 mg | INTRAMUSCULAR | Status: DC | PRN

## 2024-12-28 MED ORDER — SODIUM CHLORIDE 0.9 % IV SOLN
INTRAVENOUS | Status: AC
  Administered 2024-12-28: 75 mL/h via INTRAVENOUS

## 2024-12-28 MED ORDER — ONDANSETRON HCL 4 MG/2ML IJ SOLN
4.0000 mg | Freq: Four times a day (QID) | INTRAMUSCULAR | Status: DC | PRN
  Administered 2024-12-29 – 2024-12-31 (×3): 4 mg via INTRAVENOUS
  Filled 2024-12-28 (×3): qty 2

## 2024-12-28 MED ORDER — GUAIFENESIN 100 MG/5ML PO LIQD
5.0000 mL | ORAL | Status: DC | PRN
  Administered 2024-12-30 – 2024-12-31 (×2): 5 mL via ORAL
  Filled 2024-12-28 (×2): qty 10

## 2024-12-28 NOTE — Plan of Care (Signed)
°  Problem: Clinical Measurements: Goal: Will remain free from infection Outcome: Progressing   Problem: Nutrition: Goal: Adequate nutrition will be maintained Outcome: Progressing   Problem: Coping: Goal: Level of anxiety will decrease Outcome: Progressing

## 2024-12-28 NOTE — TOC Progression Note (Addendum)
 Transition of Care [facility]) - Progression Note    Patient Details  Name: Zachary [name] MRN: [id number] Date of Birth: [birth year]  [facility] CM/SW Contact  Luise JAYSON Pan, [place] Phone Number: 12/28/2024, 9:51 AM  Clinical Narrative:   Per MD, anticipated DC date is 1/3 at this time. CSW started SNF and PTAR auth with Jori from [employer] for [employer].   Auth pending at this time.  3:59 PM Tammy with [employer] called CSW to inform that SNF auth was approved id [id number];  PTAR approved also, id [id number]. Patients will be starting on day 7 of SNF coverage with insurance as he has already used 6 days.   CSW will continue to follow.    Expected Discharge Plan: Skilled Nursing Facility Barriers to Discharge: Continued Medical Work up, English As A Second Language Teacher               Expected Discharge Plan and Services In-house Referral: Clinical Social Work     Living arrangements for the past 2 months: Single Family Home, Skilled Nursing Facility                                       Social Drivers of Health (SDOH) Interventions SDOH Screenings   Food Insecurity: Unknown (12/26/2024)  Housing: Unknown (12/26/2024)  Transportation Needs: Unknown (12/26/2024)  Utilities: Patient Unable To Answer (12/26/2024)  Depression (PHQ2-9): High Risk (07/26/2023)  Social Connections: Unknown (12/26/2024)  Tobacco Use: Medium Risk (12/25/2024)    Readmission Risk Interventions    12/10/2024    9:44 AM 02/11/2023   12:02 PM  Readmission Risk Prevention Plan  Medication Screening Complete   Transportation Screening Complete Complete  PCP or Specialist Appt within 5-7 Days  Complete  Home Care Screening  Complete  Medication Review (RN CM)  Complete

## 2024-12-28 NOTE — Hospital Course (Addendum)
 Brief Narrative:  31 yoM with PMH as below significant for HTN, HLD, CKD 3b, bladder cancer s/p multiple surgeries and TURBT (12/2022), low-grade papillary urethral carcinoma, urinary retention requiring intermittent catheterizations 3-4x/ day, pre-DM, OSA, chronic pain, nephrolithiasis, anxiety, and depression.  Recent hospitalization for T9 vertebral fracture due to mechanical fall requiring decompression and fusion of thoracic spine discharged to rehab on 12/23.  Returns back to the hospital for altered mental status hypotension, tachycardia and hypoxia.  Admitted to the ICU for septic shock secondary to UTI.  Underwent cystoscopy on 12/29 with right-sided ureteral stent placement.  Weaned off pressors on 12/30.  Initial antibiotics IV vancomycin  and Rocephin  was switched to p.o. Augmentin .  Seen by neurosurgery due to recent spinal surgery on 12/17.  Hardware appears to be intact.  MRI showing chronic changes along with postsurgical changes but no evidence of infection.  Hopefully plan for SNF in next 2-4 days  Assessment & Plan:  Septic shock secondary to urosepsis.  Pyonephrosis status post stent Leukocytosis - Urine cultures are growing E. coli, Klebsiella and Enterococcus faecalis.  Initially while in ICU antibiotics were narrowed to Augmentin  but due to worsening leukocytosis antibiotics were broadened to linezolid  and Zosyn .  Now transition to linezolid  and Unasyn . Eventual PO Regimen Linezolid /Augmentin .   CT abdomen pelvis and right upper quadrant ultrasound shows distended gallbladder, gallstones and some sludge but no evidence of acute cholecystitis.  LFTs are overall normal.  Monitor fever curve.  If you may need to consult ID  Chronic Foley catheter, replaced during this admission  Vertebral fracture, thoracic spine status post decompression/fusion Incisional drainage? - Recently performed on 12/17.  Routine pain control.  Seen by neurosurgery, Dr. Onetha.  Hardware appears to be  intact. - MRI thoracic spine-shows lots of chronic changes and postsurgical changes but no obvious evidence of infection noted  Metabolic encephalopathy - Secondary to underlying infection.  Also has delirium.  Supportive measures  AKI on CKD 3B Hydronephrosis Urinary retention with chronic Foley catheter History of bladder cancer requiring previous TURBT -Continue Foley catheter.  Urology team is following.  Ureteroscopy planned in 2-3 weeks.  Per their service, no voiding trial necessary due to chronic catheter - Baseline creatinine 2.0, peaked at 3.9 during this admission now stable around 2.5  Essential hypertension - Slowly resume home BP medications as he is able to tolerate.  IV as needed  Hyperlipidemia - Pravastatin   Major depressive disorder Delirium - Cymbalta .  Seen by psychiatry team.  As needed Ativan  and bedtime Remeron  has been added. - B12, TSH, ammonia, folate are normal   PT/OT Palliative care team assisting with goals of care discussions.  DVT prophylaxis: heparin  injection 5,000 Units Start: 12/25/24 2200 SCDs Start: 12/25/24 1759      Code Status: Limited: Do not attempt resuscitation (DNR) -DNR-LIMITED -Do Not Intubate/DNI  Family Communication:   Status is: Inpatient Remains inpatient appropriate because: Continue antibiotics.  Hopefully SNF in next 2-4 days   PT Follow up Recs: Skilled Nursing-Short Term Rehab (<3 Hours/Day)12/27/2024 1501  Subjective: Feels a little better, no new complaints  Examination:  General exam: Appears calm and comfortable  Respiratory system: Clear to auscultation. Respiratory effort normal. Cardiovascular system: S1 & S2 heard, RRR. No JVD, murmurs, rubs, gallops or clicks. No pedal edema. Gastrointestinal system: Abdomen is nondistended, soft and nontender. No organomegaly or masses felt. Normal bowel sounds heard. Central nervous system: Alert and oriented to name and place. No focal neurological  deficits. Extremities: Symmetric 5 x 5  power. Skin: No rashes, lesions or ulcers.  Concerns about lower back incisional drainage Psychiatry: Poor memory Foley catheter is in place

## 2024-12-28 NOTE — Progress Notes (Signed)
 " PROGRESS NOTE    Zachary [name]  FMW:[id number] DOB: [birth year] DOA: 12/25/2024 PCP: Shona Norleen PEDLAR, MD    Brief Narrative:  36 yoM with PMH as below significant for HTN, HLD, CKD 3b, bladder cancer s/p multiple surgeries and TURBT (12/2022), low-grade papillary urethral carcinoma, urinary retention requiring intermittent catheterizations 3-4x/ day, pre-DM, OSA, chronic pain, nephrolithiasis, anxiety, and depression.  Recent hospitalization for T9 vertebral fracture due to mechanical fall requiring decompression and fusion of thoracic spine discharged to rehab on 12/23.  Returns back to the hospital for altered mental status hypotension, tachycardia and hypoxia.  Admitted to the ICU for septic shock secondary to UTI.  Underwent cystoscopy on 12/29 with right-sided ureteral stent placement.  Weaned off pressors on 12/30.   Assessment & Plan:  Septic shock secondary to urosepsis.  Pyonephrosis status post stent - Urine cultures are growing E. coli, Klebsiella and Enterococcus faecalis.  Initially on IV antibiotics now switched to Augmentin.  Follow-up culture data.  Urology following will need repeat cystoscopy in 2-3 weeks  Metabolic encephalopathy - Secondary to underlying infection.  Also has delirium.  Supportive measures  AKI on CKD 3B Hydronephrosis Urinary retention with chronic Foley catheter History of bladder cancer requiring previous TURBT -Continue Foley catheter.  Urology team is following - Baseline creatinine 2.0, peaked at 3.9 during this admission now improving  Essential hypertension - Slowly resume home BP medications as he is able to tolerate.  IV as needed  Vertebral fracture, thoracic spine status post decompression/fusion Incisional drainage - Recently performed on 12/17.  Routine pain control. -Notified neurosurgery, Dr. Onetha.  Will evaluate the patient  Hyperlipidemia - Pravastatin   Anxiety/depression - Cymbalta    DVT prophylaxis: heparin  injection 5,000  Units Start: 12/25/24 2200 SCDs Start: 12/25/24 1759      Code Status: Do not attempt resuscitation (DNR) PRE-ARREST INTERVENTIONS DESIRED Family Communication:   Status is: Inpatient Remains inpatient appropriate because: Continue antibiotics.   PT Follow up Recs: Skilled Nursing-Short Term Rehab (<3 Hours/Day)12/27/2024 1501  Subjective:  Seen at bedside.  Able to answer most of the questions but very forgetful  Examination:  General exam: Appears calm and comfortable  Respiratory system: Clear to auscultation. Respiratory effort normal. Cardiovascular system: S1 & S2 heard, RRR. No JVD, murmurs, rubs, gallops or clicks. No pedal edema. Gastrointestinal system: Abdomen is nondistended, soft and nontender. No organomegaly or masses felt. Normal bowel sounds heard. Central nervous system: Alert and oriented. No focal neurological deficits. Extremities: Symmetric 5 x 5 power. Skin: No rashes, lesions or ulcers.  Concerns about lower back incisional drainage Psychiatry: Memory            Wound 12/25/24 1711 Pressure Injury Coccyx Right Stage 1 -  Intact skin with non-blanchable redness of a localized area usually over a bony prominence. (Active)     Wound 12/25/24 1714 Pressure Injury Heel Left Deep Tissue Pressure Injury - Purple or maroon localized area of discolored intact skin or blood-filled blister due to damage of underlying soft tissue from pressure and/or shear. (Active)     Diet Orders (From admission, onward)     Start     Ordered   12/26/24 1011  Diet regular Fluid consistency: Nectar Thick  Diet effective now       Question:  Fluid consistency:  Answer:  Nectar Thick   12/26/24 1010            Objective: Vitals:   12/28/24 0700 12/28/24 0800 12/28/24 0836 12/28/24 0900  BP:  ROLLEN)  165/86  (!) 177/88  Pulse:  77 69 75  Resp:  (!) 23 (!) 21 (!) 23  Temp:  98.2 F (36.8 C) 98.4 F (36.9 C) 98.4 F (36.9 C)  TempSrc:      SpO2:  95% 97% 98%   Weight: 86.6 kg       Intake/Output Summary (Last 24 hours) at 12/28/2024 1055 Last data filed at 12/28/2024 1000 Gross per 24 hour  Intake 426.99 ml  Output 3375 ml  Net -2948.01 ml   Filed Weights   12/27/24 0711 12/28/24 0700  Weight: 89.4 kg 86.6 kg    Scheduled Meds:  amoxicillin-clavulanate  1 tablet Oral BID   carvedilol   6.25 mg Oral BID   Chlorhexidine  Gluconate Cloth  6 each Topical Daily   dorzolamide-timolol  1 drop Both Eyes BID   DULoxetine   60 mg Oral BID   heparin   5,000 Units Subcutaneous Q8H   latanoprost  1 drop Both Eyes QHS   lidocaine   1 patch Transdermal Q24H   lidocaine  (PF)  5 mL     melatonin  3 mg Oral QHS   polyethylene glycol  17 g Oral Daily   pravastatin   10 mg Oral QHS   Continuous Infusions:  sodium chloride  75 mL/hr at 12/28/24 1000    Nutritional status     Body mass index is 24.85 kg/m.  Data Reviewed:   CBC: Recent Labs  Lab 12/25/24 0922 12/26/24 0349 12/27/24 0354 12/28/24 0259  WBC 12.6* 33.4* 28.9* 21.2*  NEUTROABS 12.1*  --   --   --   HGB 12.8* 9.8* 10.4* 10.9*  HCT 39.7 30.5* 31.5* 33.7*  MCV 88.4 89.2 85.8 86.4  PLT 392 297 333 330   Basic Metabolic Panel: Recent Labs  Lab 12/25/24 0922 12/26/24 0349 12/27/24 1127 12/28/24 0259  NA 138 137 142 143  K 4.2 4.7 4.5 4.7  CL 103 103 108 109  CO2 16* 21* 20* 21*  GLUCOSE 82 140* 126* 98  BUN 89* 87* 91* 88*  CREATININE 3.90* 3.76* 3.09* 2.83*  CALCIUM 8.3* 8.1* 8.3* 8.8*  MG 2.1 2.0 2.2 2.1  PHOS  --  5.0* 3.9  --    GFR: Estimated Creatinine Clearance: 24.7 mL/min (A) (by C-G formula based on SCr of 2.83 mg/dL (H)). Liver Function Tests: Recent Labs  Lab 12/25/24 0922 12/26/24 0349  AST 30 23  ALT 18 15  ALKPHOS 257* 171*  BILITOT 0.5 0.3  PROT 6.4* 5.9*  ALBUMIN  2.8* 2.6*   No results for input(s): LIPASE, AMYLASE in the last 168 hours. No results for input(s): AMMONIA in the last 168 hours. Coagulation Profile: Recent Labs  Lab  12/25/24 0922 12/26/24 0349  INR 1.3* 1.3*   Cardiac Enzymes: No results for input(s): CKTOTAL, CKMB, CKMBINDEX, TROPONINI in the last 168 hours. BNP (last 3 results) Recent Labs    12/25/24 0922  PROBNP 2,105.0*   HbA1C: No results for input(s): HGBA1C in the last 72 hours. CBG: Recent Labs  Lab 12/27/24 1508 12/27/24 2009 12/28/24 0046 12/28/24 0402 12/28/24 0805  GLUCAP 121* 137* 103* 102* 103*   Lipid Profile: No results for input(s): CHOL, HDL, LDLCALC, TRIG, CHOLHDL, LDLDIRECT in the last 72 hours. Thyroid  Function Tests: No results for input(s): TSH, T4TOTAL, FREET4, T3FREE, THYROIDAB in the last 72 hours. Anemia Panel: No results for input(s): VITAMINB12, FOLATE, FERRITIN, TIBC, IRON, RETICCTPCT in the last 72 hours. Sepsis Labs: Recent Labs  Lab 12/25/24 9077 12/25/24 1115  LATICACIDVEN  2.7* 2.3*    Recent Results (from the past 240 hours)  Blood Culture (routine x 2)     Status: None (Preliminary result)   Collection Time: 12/25/24  9:22 AM   Specimen: BLOOD  Result Value Ref Range Status   Specimen Description BLOOD LEFT ARM  Final   Special Requests   Final    BOTTLES DRAWN AEROBIC AND ANAEROBIC Blood Culture adequate volume   Culture   Final    NO GROWTH 3 DAYS Performed at [facility], [address], [place], [place] [address]    Report Status PENDING  Incomplete  Blood Culture (routine x 2)     Status: None (Preliminary result)   Collection Time: 12/25/24 10:08 AM   Specimen: BLOOD  Result Value Ref Range Status   Specimen Description BLOOD BLOOD RIGHT ARM  Final   Special Requests   Final    BOTTLES DRAWN AEROBIC AND ANAEROBIC Blood Culture adequate volume   Culture   Final    NO GROWTH 3 DAYS Performed at [facility], [address] [place], [place] [address]    Report Status PENDING  Incomplete  Urine Culture     Status: Abnormal   Collection Time: 12/25/24  3:27 PM   Specimen: Urine,  Catheterized  Result Value Ref Range Status   Specimen Description   Final    URINE, CATHETERIZED Performed at [facility], [address], [place], [place] [address]    Special Requests   Final    NONE Performed at [facility], [address] [place], [place] [address]    Culture (A)  Final    60,000 COLONIES/mL ESCHERICHIA COLI >=100,000 COLONIES/mL ENTEROCOCCUS FAECALIS    Report Status 12/27/2024 FINAL  Final   Organism ID, Bacteria ESCHERICHIA COLI (A)  Final   Organism ID, Bacteria ENTEROCOCCUS FAECALIS (A)  Final      Susceptibility   Escherichia coli - MIC*    AMPICILLIN 8 SENSITIVE Sensitive     CEFAZOLIN  (URINE) Value in next row Sensitive      2 SENSITIVEThis is a modified FDA-approved test that has been validated and its performance characteristics determined by the reporting laboratory.  This laboratory is certified under the Clinical Laboratory Improvement Amendments CLIA as qualified to perform high complexity clinical laboratory testing.    CEFEPIME  Value in next row Sensitive      2 SENSITIVEThis is a modified FDA-approved test that has been validated and its performance characteristics determined by the reporting laboratory.  This laboratory is certified under the Clinical Laboratory Improvement Amendments CLIA as qualified to perform high complexity clinical laboratory testing.    ERTAPENEM Value in next row Sensitive      2 SENSITIVEThis is a modified FDA-approved test that has been validated and its performance characteristics determined by the reporting laboratory.  This laboratory is certified under the Clinical Laboratory Improvement Amendments CLIA as qualified to perform high complexity clinical laboratory testing.    CEFTRIAXONE  Value in next row Sensitive      2 SENSITIVEThis is a modified FDA-approved test that has been validated and its performance characteristics determined by the reporting laboratory.  This laboratory is certified under the Clinical  Laboratory Improvement Amendments CLIA as qualified to perform high complexity clinical laboratory testing.    CIPROFLOXACIN  Value in next row Sensitive      2 SENSITIVEThis is a modified FDA-approved test that has been validated and its performance characteristics determined by the reporting laboratory.  This laboratory is certified under  the Clinical Laboratory Improvement Amendments CLIA as qualified to perform high complexity clinical laboratory testing.    GENTAMICIN Value in next row Sensitive      2 SENSITIVEThis is a modified FDA-approved test that has been validated and its performance characteristics determined by the reporting laboratory.  This laboratory is certified under the Clinical Laboratory Improvement Amendments CLIA as qualified to perform high complexity clinical laboratory testing.    NITROFURANTOIN Value in next row Sensitive      2 SENSITIVEThis is a modified FDA-approved test that has been validated and its performance characteristics determined by the reporting laboratory.  This laboratory is certified under the Clinical Laboratory Improvement Amendments CLIA as qualified to perform high complexity clinical laboratory testing.    TRIMETH/SULFA Value in next row Sensitive      2 SENSITIVEThis is a modified FDA-approved test that has been validated and its performance characteristics determined by the reporting laboratory.  This laboratory is certified under the Clinical Laboratory Improvement Amendments CLIA as qualified to perform high complexity clinical laboratory testing.    AMPICILLIN/SULBACTAM Value in next row Sensitive      2 SENSITIVEThis is a modified FDA-approved test that has been validated and its performance characteristics determined by the reporting laboratory.  This laboratory is certified under the Clinical Laboratory Improvement Amendments CLIA as qualified to perform high complexity clinical laboratory testing.    PIP/TAZO Value in next row Sensitive      <=4  SENSITIVEThis is a modified FDA-approved test that has been validated and its performance characteristics determined by the reporting laboratory.  This laboratory is certified under the Clinical Laboratory Improvement Amendments CLIA as qualified to perform high complexity clinical laboratory testing.    MEROPENEM Value in next row Sensitive      <=4 SENSITIVEThis is a modified FDA-approved test that has been validated and its performance characteristics determined by the reporting laboratory.  This laboratory is certified under the Clinical Laboratory Improvement Amendments CLIA as qualified to perform high complexity clinical laboratory testing.    * 60,000 COLONIES/mL ESCHERICHIA COLI   Enterococcus faecalis - MIC*    AMPICILLIN Value in next row Sensitive      <=4 SENSITIVEThis is a modified FDA-approved test that has been validated and its performance characteristics determined by the reporting laboratory.  This laboratory is certified under the Clinical Laboratory Improvement Amendments CLIA as qualified to perform high complexity clinical laboratory testing.    NITROFURANTOIN Value in next row Sensitive      <=4 SENSITIVEThis is a modified FDA-approved test that has been validated and its performance characteristics determined by the reporting laboratory.  This laboratory is certified under the Clinical Laboratory Improvement Amendments CLIA as qualified to perform high complexity clinical laboratory testing.    VANCOMYCIN  Value in next row Sensitive      <=4 SENSITIVEThis is a modified FDA-approved test that has been validated and its performance characteristics determined by the reporting laboratory.  This laboratory is certified under the Clinical Laboratory Improvement Amendments CLIA as qualified to perform high complexity clinical laboratory testing.    * >=100,000 COLONIES/mL ENTEROCOCCUS FAECALIS  Urine Culture     Status: Abnormal (Preliminary result)   Collection Time: 12/25/24  9:01 PM    Specimen: Kidney, Right; Urine  Result Value Ref Range Status   Specimen Description URINE, RANDOM  Final   Special Requests RIGHT RENAL PELVIS SPEC A  Final   Culture (A)  Final    >=100,000 COLONIES/mL KLEBSIELLA PNEUMONIAE CULTURE REINCUBATED FOR  BETTER GROWTH Performed at [facility], [address] [place], [place] [address]    Report Status PENDING  Incomplete   Organism ID, Bacteria KLEBSIELLA PNEUMONIAE (A)  Final      Susceptibility   Klebsiella pneumoniae - MIC*    AMPICILLIN RESISTANT Resistant     CEFAZOLIN  (URINE) Value in next row Sensitive      2 SENSITIVEThis is a modified FDA-approved test that has been validated and its performance characteristics determined by the reporting laboratory.  This laboratory is certified under the Clinical Laboratory Improvement Amendments CLIA as qualified to perform high complexity clinical laboratory testing.    CEFEPIME  Value in next row Sensitive      2 SENSITIVEThis is a modified FDA-approved test that has been validated and its performance characteristics determined by the reporting laboratory.  This laboratory is certified under the Clinical Laboratory Improvement Amendments CLIA as qualified to perform high complexity clinical laboratory testing.    ERTAPENEM Value in next row Sensitive      2 SENSITIVEThis is a modified FDA-approved test that has been validated and its performance characteristics determined by the reporting laboratory.  This laboratory is certified under the Clinical Laboratory Improvement Amendments CLIA as qualified to perform high complexity clinical laboratory testing.    CEFTRIAXONE  Value in next row Sensitive      2 SENSITIVEThis is a modified FDA-approved test that has been validated and its performance characteristics determined by the reporting laboratory.  This laboratory is certified under the Clinical Laboratory Improvement Amendments CLIA as qualified to perform high complexity clinical laboratory  testing.    CIPROFLOXACIN  Value in next row Sensitive      2 SENSITIVEThis is a modified FDA-approved test that has been validated and its performance characteristics determined by the reporting laboratory.  This laboratory is certified under the Clinical Laboratory Improvement Amendments CLIA as qualified to perform high complexity clinical laboratory testing.    GENTAMICIN Value in next row Sensitive      2 SENSITIVEThis is a modified FDA-approved test that has been validated and its performance characteristics determined by the reporting laboratory.  This laboratory is certified under the Clinical Laboratory Improvement Amendments CLIA as qualified to perform high complexity clinical laboratory testing.    NITROFURANTOIN Value in next row Intermediate      2 SENSITIVEThis is a modified FDA-approved test that has been validated and its performance characteristics determined by the reporting laboratory.  This laboratory is certified under the Clinical Laboratory Improvement Amendments CLIA as qualified to perform high complexity clinical laboratory testing.    TRIMETH/SULFA Value in next row Sensitive      2 SENSITIVEThis is a modified FDA-approved test that has been validated and its performance characteristics determined by the reporting laboratory.  This laboratory is certified under the Clinical Laboratory Improvement Amendments CLIA as qualified to perform high complexity clinical laboratory testing.    AMPICILLIN/SULBACTAM Value in next row Sensitive      2 SENSITIVEThis is a modified FDA-approved test that has been validated and its performance characteristics determined by the reporting laboratory.  This laboratory is certified under the Clinical Laboratory Improvement Amendments CLIA as qualified to perform high complexity clinical laboratory testing.    PIP/TAZO Value in next row Sensitive      <=4 SENSITIVEThis is a modified FDA-approved test that has been validated and its performance  characteristics determined by the reporting laboratory.  This laboratory is certified under the Clinical Laboratory Improvement Amendments CLIA as qualified to perform  high complexity clinical laboratory testing.    MEROPENEM Value in next row Sensitive      <=4 SENSITIVEThis is a modified FDA-approved test that has been validated and its performance characteristics determined by the reporting laboratory.  This laboratory is certified under the Clinical Laboratory Improvement Amendments CLIA as qualified to perform high complexity clinical laboratory testing.    * >=100,000 COLONIES/mL KLEBSIELLA PNEUMONIAE         Radiology Studies: No results found.         LOS: 3 days   Time spent= 35 mins    Burgess JAYSON Dare, MD [facility]  If 7PM-7AM, please contact night-coverage  12/28/2024, 10:55 AM  "

## 2024-12-29 DIAGNOSIS — A419 Sepsis, unspecified organism: Secondary | ICD-10-CM | POA: Diagnosis not present

## 2024-12-29 DIAGNOSIS — R6521 Severe sepsis with septic shock: Secondary | ICD-10-CM | POA: Diagnosis not present

## 2024-12-29 LAB — BASIC METABOLIC PANEL WITH GFR
Anion gap: 10 (ref 5–15)
BUN: 73 mg/dL — ABNORMAL HIGH (ref 8–23)
CO2: 22 mmol/L (ref 22–32)
Calcium: 8.4 mg/dL — ABNORMAL LOW (ref 8.9–10.3)
Chloride: 111 mmol/L (ref 98–111)
Creatinine, Ser: 2.48 mg/dL — ABNORMAL HIGH (ref 0.61–1.24)
GFR, Estimated: 26 mL/min — ABNORMAL LOW
Glucose, Bld: 98 mg/dL (ref 70–99)
Potassium: 4.8 mmol/L (ref 3.5–5.1)
Sodium: 143 mmol/L (ref 135–145)

## 2024-12-29 LAB — CBC
HCT: 35 % — ABNORMAL LOW (ref 39.0–52.0)
Hemoglobin: 11.2 g/dL — ABNORMAL LOW (ref 13.0–17.0)
MCH: 28.5 pg (ref 26.0–34.0)
MCHC: 32 g/dL (ref 30.0–36.0)
MCV: 89.1 fL (ref 80.0–100.0)
Platelets: 284 K/uL (ref 150–400)
RBC: 3.93 MIL/uL — ABNORMAL LOW (ref 4.22–5.81)
RDW: 14.9 % (ref 11.5–15.5)
WBC: 16.4 K/uL — ABNORMAL HIGH (ref 4.0–10.5)
nRBC: 0 % (ref 0.0–0.2)

## 2024-12-29 LAB — URINE CULTURE: Culture: >=100000 — AB

## 2024-12-29 LAB — GLUCOSE, CAPILLARY
Glucose-Capillary: 125 mg/dL — ABNORMAL HIGH (ref 70–99)
Glucose-Capillary: 87 mg/dL (ref 70–99)
Glucose-Capillary: 112 mg/dL — ABNORMAL HIGH (ref 70–99)
Glucose-Capillary: 114 mg/dL — ABNORMAL HIGH (ref 70–99)
Glucose-Capillary: 97 mg/dL (ref 70–99)
Glucose-Capillary: 95 mg/dL (ref 70–99)

## 2024-12-29 LAB — MAGNESIUM: Magnesium: 1.9 mg/dL (ref 1.7–2.4)

## 2024-12-29 MED ORDER — SODIUM CHLORIDE 0.9 % IV SOLN: INTRAVENOUS | Status: AC

## 2024-12-29 NOTE — Progress Notes (Signed)
 " PROGRESS NOTE    Zachary [name]  FMW:[id number] DOB: 1946-06-22 DOA: 12/25/2024 PCP: Shona Norleen PEDLAR, MD    Brief Narrative:  57 yoM with PMH as below significant for HTN, HLD, CKD 3b, bladder cancer s/p multiple surgeries and TURBT (12/2022), low-grade papillary urethral carcinoma, urinary retention requiring intermittent catheterizations 3-4x/ day, pre-DM, OSA, chronic pain, nephrolithiasis, anxiety, and depression.  Recent hospitalization for T9 vertebral fracture due to mechanical fall requiring decompression and fusion of thoracic spine discharged to rehab on 12/23.  Returns back to the hospital for altered mental status hypotension, tachycardia and hypoxia.  Admitted to the ICU for septic shock secondary to UTI.  Underwent cystoscopy on 12/29 with right-sided ureteral stent placement.  Weaned off pressors on 12/30.  Initial antibiotics IV vancomycin  and Rocephin  was switched to p.o. Augmentin.  Seen by neurosurgery due to recent spinal surgery on 12/17.  Hardware appears to be intact.   Assessment & Plan:  Septic shock secondary to urosepsis.  Pyonephrosis status post stent - Urine cultures are growing E. coli, Klebsiella and Enterococcus faecalis.  Initially on IV antibiotics now switched to Augmentin.  We will plan for total of 2 weeks of antibiotics.  Follow-up culture data.  Urology following will need repeat cystoscopy in 2-3 weeks - Leukocytosis improving.  Metabolic encephalopathy - Secondary to underlying infection.  Also has delirium.  Supportive measures  AKI on CKD 3B Hydronephrosis Urinary retention with chronic Foley catheter History of bladder cancer requiring previous TURBT -Continue Foley catheter.  Urology team is following - Baseline creatinine 2.0, peaked at 3.9 during this admission now improving  Essential hypertension - Slowly resume home BP medications as he is able to tolerate.  IV as needed  Vertebral fracture, thoracic spine status post  decompression/fusion Incisional drainage? - Recently performed on 12/17.  Routine pain control.  Seen by neurosurgery, Dr. Onetha.  Hardware appears to be intact  Hyperlipidemia - Pravastatin   Anxiety/depression - Cymbalta   PT/OT Palliative care consult for goals of care discussion given his advanced comorbidities  DVT prophylaxis: heparin  injection 5,000 Units Start: 12/25/24 2200 SCDs Start: 12/25/24 1759      Code Status: Do not attempt resuscitation (DNR) PRE-ARREST INTERVENTIONS DESIRED Family Communication:   Status is: Inpatient Remains inpatient appropriate because: Continue antibiotics.   PT Follow up Recs: Skilled Nursing-Short Term Rehab (<3 Hours/Day)12/27/2024 1501  Subjective:  Feels weak quite forgetful.  Examination:  General exam: Appears calm and comfortable  Respiratory system: Clear to auscultation. Respiratory effort normal. Cardiovascular system: S1 & S2 heard, RRR. No JVD, murmurs, rubs, gallops or clicks. No pedal edema. Gastrointestinal system: Abdomen is nondistended, soft and nontender. No organomegaly or masses felt. Normal bowel sounds heard. Central nervous system: Alert and oriented. No focal neurological deficits. Extremities: Symmetric 5 x 5 power. Skin: No rashes, lesions or ulcers.  Concerns about lower back incisional drainage Psychiatry: Poor memory            Wound 12/25/24 1711 Pressure Injury Coccyx Right Stage 1 -  Intact skin with non-blanchable redness of a localized area usually over a bony prominence. (Active)     Wound 12/25/24 1714 Pressure Injury Heel Left Deep Tissue Pressure Injury - Purple or maroon localized area of discolored intact skin or blood-filled blister due to damage of underlying soft tissue from pressure and/or shear. (Active)     Diet Orders (From admission, onward)     Start     Ordered   12/26/24 1011  Diet regular Fluid consistency:  Nectar Thick  Diet effective now       Question:  Fluid  consistency:  Answer:  Nectar Thick   12/26/24 1010            Objective: Vitals:   12/28/24 2018 12/28/24 2258 12/29/24 0343 12/29/24 0740  BP: (!) 162/79 (!) 152/82 (!) 172/86 (!) 150/93  Pulse: 75 86 79 84  Resp: 19 16 17 15   Temp: 97.8 F (36.6 C) 97.8 F (36.6 C) 97.7 F (36.5 C) 98 F (36.7 C)  TempSrc: Oral Oral Oral Oral  SpO2: 99% 99% 100% 96%  Weight:   87.3 kg     Intake/Output Summary (Last 24 hours) at 12/29/2024 1053 Last data filed at 12/29/2024 0404 Gross per 24 hour  Intake 75 ml  Output 2351 ml  Net -2276 ml   Filed Weights   12/27/24 0711 12/28/24 0700 12/29/24 0343  Weight: 89.4 kg 86.6 kg 87.3 kg    Scheduled Meds:  amoxicillin-clavulanate  1 tablet Oral BID   carvedilol   6.25 mg Oral BID   Chlorhexidine  Gluconate Cloth  6 each Topical Daily   dorzolamide-timolol  1 drop Both Eyes BID   DULoxetine   60 mg Oral BID   heparin   5,000 Units Subcutaneous Q8H   latanoprost  1 drop Both Eyes QHS   lidocaine   1 patch Transdermal Q24H   lidocaine  (PF)  5 mL     melatonin  3 mg Oral QHS   polyethylene glycol  17 g Oral Daily   pravastatin   10 mg Oral QHS   Continuous Infusions:  sodium chloride  75 mL/hr at 12/29/24 0905    Nutritional status     Body mass index is 25.05 kg/m.  Data Reviewed:   CBC: Recent Labs  Lab 12/25/24 0922 12/26/24 0349 12/27/24 0354 12/28/24 0259 12/29/24 0159  WBC 12.6* 33.4* 28.9* 21.2* 16.4*  NEUTROABS 12.1*  --   --   --   --   HGB 12.8* 9.8* 10.4* 10.9* 11.2*  HCT 39.7 30.5* 31.5* 33.7* 35.0*  MCV 88.4 89.2 85.8 86.4 89.1  PLT 392 297 333 330 284   Basic Metabolic Panel: Recent Labs  Lab 12/25/24 0922 12/26/24 0349 12/27/24 1127 12/28/24 0259 12/29/24 0159  NA 138 137 142 143 143  K 4.2 4.7 4.5 4.7 4.8  CL 103 103 108 109 111  CO2 16* 21* 20* 21* 22  GLUCOSE 82 140* 126* 98 98  BUN 89* 87* 91* 88* 73*  CREATININE 3.90* 3.76* 3.09* 2.83* 2.48*  CALCIUM 8.3* 8.1* 8.3* 8.8* 8.4*  MG 2.1 2.0  2.2 2.1 1.9  PHOS  --  5.0* 3.9  --   --    GFR: Estimated Creatinine Clearance: 28.2 mL/min (A) (by C-G formula based on SCr of 2.48 mg/dL (H)). Liver Function Tests: Recent Labs  Lab 12/25/24 0922 12/26/24 0349  AST 30 23  ALT 18 15  ALKPHOS 257* 171*  BILITOT 0.5 0.3  PROT 6.4* 5.9*  ALBUMIN  2.8* 2.6*   No results for input(s): LIPASE, AMYLASE in the last 168 hours. No results for input(s): AMMONIA in the last 168 hours. Coagulation Profile: Recent Labs  Lab 12/25/24 0922 12/26/24 0349  INR 1.3* 1.3*   Cardiac Enzymes: No results for input(s): CKTOTAL, CKMB, CKMBINDEX, TROPONINI in the last 168 hours. BNP (last 3 results) Recent Labs    12/25/24 0922  PROBNP 2,105.0*   HbA1C: No results for input(s): HGBA1C in the last 72 hours. CBG: Recent Labs  Lab  12/28/24 1623 12/28/24 1956 12/28/24 2310 12/29/24 0342 12/29/24 0811  GLUCAP 83 97 101* 125* 87   Lipid Profile: No results for input(s): CHOL, HDL, LDLCALC, TRIG, CHOLHDL, LDLDIRECT in the last 72 hours. Thyroid  Function Tests: No results for input(s): TSH, T4TOTAL, FREET4, T3FREE, THYROIDAB in the last 72 hours. Anemia Panel: No results for input(s): VITAMINB12, FOLATE, FERRITIN, TIBC, IRON, RETICCTPCT in the last 72 hours. Sepsis Labs: Recent Labs  Lab 12/25/24 9077 12/25/24 1115  LATICACIDVEN 2.7* 2.3*    Recent Results (from the past 240 hours)  Blood Culture (routine x 2)     Status: None (Preliminary result)   Collection Time: 12/25/24  9:22 AM   Specimen: BLOOD  Result Value Ref Range Status   Specimen Description BLOOD LEFT ARM  Final   Special Requests   Final    BOTTLES DRAWN AEROBIC AND ANAEROBIC Blood Culture adequate volume   Culture   Final    NO GROWTH 4 DAYS Performed at [facility], [address], [place], [place] [address]    Report Status PENDING  Incomplete  Blood Culture (routine x 2)     Status: None (Preliminary result)    Collection Time: 12/25/24 10:08 AM   Specimen: BLOOD  Result Value Ref Range Status   Specimen Description BLOOD BLOOD RIGHT ARM  Final   Special Requests   Final    BOTTLES DRAWN AEROBIC AND ANAEROBIC Blood Culture adequate volume   Culture   Final    NO GROWTH 4 DAYS Performed at [facility], [address] [place], [place] [address]    Report Status PENDING  Incomplete  Urine Culture     Status: Abnormal   Collection Time: 12/25/24  3:27 PM   Specimen: Urine, Catheterized  Result Value Ref Range Status   Specimen Description   Final    URINE, CATHETERIZED Performed at [facility], [address] [place], [place] [address]    Special Requests   Final    NONE Performed at [facility], [address] [place], [place] [address]    Culture (A)  Final    60,000 COLONIES/mL ESCHERICHIA COLI >=100,000 COLONIES/mL ENTEROCOCCUS FAECALIS    Report Status 12/27/2024 FINAL  Final   Organism ID, Bacteria ESCHERICHIA COLI (A)  Final   Organism ID, Bacteria ENTEROCOCCUS FAECALIS (A)  Final      Susceptibility   Escherichia coli - MIC*    AMPICILLIN 8 SENSITIVE Sensitive     CEFAZOLIN  (URINE) Value in next row Sensitive      2 SENSITIVEThis is a modified FDA-approved test that has been validated and its performance characteristics determined by the reporting laboratory.  This laboratory is certified under the Clinical Laboratory Improvement Amendments CLIA as qualified to perform high complexity clinical laboratory testing.    CEFEPIME  Value in next row Sensitive      2 SENSITIVEThis is a modified FDA-approved test that has been validated and its performance characteristics determined by the reporting laboratory.  This laboratory is certified under the Clinical Laboratory Improvement Amendments CLIA as qualified to perform high complexity clinical laboratory testing.    ERTAPENEM Value in next row Sensitive      2 SENSITIVEThis is a modified FDA-approved test that has been validated and  its performance characteristics determined by the reporting laboratory.  This laboratory is certified under the Clinical Laboratory Improvement Amendments CLIA as qualified to perform high complexity clinical laboratory testing.    CEFTRIAXONE  Value in next row Sensitive  2 SENSITIVEThis is a modified FDA-approved test that has been validated and its performance characteristics determined by the reporting laboratory.  This laboratory is certified under the Clinical Laboratory Improvement Amendments CLIA as qualified to perform high complexity clinical laboratory testing.    CIPROFLOXACIN  Value in next row Sensitive      2 SENSITIVEThis is a modified FDA-approved test that has been validated and its performance characteristics determined by the reporting laboratory.  This laboratory is certified under the Clinical Laboratory Improvement Amendments CLIA as qualified to perform high complexity clinical laboratory testing.    GENTAMICIN Value in next row Sensitive      2 SENSITIVEThis is a modified FDA-approved test that has been validated and its performance characteristics determined by the reporting laboratory.  This laboratory is certified under the Clinical Laboratory Improvement Amendments CLIA as qualified to perform high complexity clinical laboratory testing.    NITROFURANTOIN Value in next row Sensitive      2 SENSITIVEThis is a modified FDA-approved test that has been validated and its performance characteristics determined by the reporting laboratory.  This laboratory is certified under the Clinical Laboratory Improvement Amendments CLIA as qualified to perform high complexity clinical laboratory testing.    TRIMETH/SULFA Value in next row Sensitive      2 SENSITIVEThis is a modified FDA-approved test that has been validated and its performance characteristics determined by the reporting laboratory.  This laboratory is certified under the Clinical Laboratory Improvement Amendments CLIA as  qualified to perform high complexity clinical laboratory testing.    AMPICILLIN/SULBACTAM Value in next row Sensitive      2 SENSITIVEThis is a modified FDA-approved test that has been validated and its performance characteristics determined by the reporting laboratory.  This laboratory is certified under the Clinical Laboratory Improvement Amendments CLIA as qualified to perform high complexity clinical laboratory testing.    PIP/TAZO Value in next row Sensitive      <=4 SENSITIVEThis is a modified FDA-approved test that has been validated and its performance characteristics determined by the reporting laboratory.  This laboratory is certified under the Clinical Laboratory Improvement Amendments CLIA as qualified to perform high complexity clinical laboratory testing.    MEROPENEM Value in next row Sensitive      <=4 SENSITIVEThis is a modified FDA-approved test that has been validated and its performance characteristics determined by the reporting laboratory.  This laboratory is certified under the Clinical Laboratory Improvement Amendments CLIA as qualified to perform high complexity clinical laboratory testing.    * 60,000 COLONIES/mL ESCHERICHIA COLI   Enterococcus faecalis - MIC*    AMPICILLIN Value in next row Sensitive      <=4 SENSITIVEThis is a modified FDA-approved test that has been validated and its performance characteristics determined by the reporting laboratory.  This laboratory is certified under the Clinical Laboratory Improvement Amendments CLIA as qualified to perform high complexity clinical laboratory testing.    NITROFURANTOIN Value in next row Sensitive      <=4 SENSITIVEThis is a modified FDA-approved test that has been validated and its performance characteristics determined by the reporting laboratory.  This laboratory is certified under the Clinical Laboratory Improvement Amendments CLIA as qualified to perform high complexity clinical laboratory testing.    VANCOMYCIN  Value  in next row Sensitive      <=4 SENSITIVEThis is a modified FDA-approved test that has been validated and its performance characteristics determined by the reporting laboratory.  This laboratory is certified under the Clinical Laboratory Improvement Amendments CLIA as qualified  to perform high complexity clinical laboratory testing.    * >=100,000 COLONIES/mL ENTEROCOCCUS FAECALIS  Urine Culture     Status: Abnormal (Preliminary result)   Collection Time: 12/25/24  9:01 PM   Specimen: Kidney, Right; Urine  Result Value Ref Range Status   Specimen Description URINE, RANDOM  Final   Special Requests RIGHT RENAL PELVIS SPEC A  Final   Culture (A)  Final    >=100,000 COLONIES/mL KLEBSIELLA PNEUMONIAE 30,000 COLONIES/mL ENTEROCOCCUS RAFFINOSUS SUSCEPTIBILITIES TO FOLLOW Performed at [facility], [address], [place], [place] [address]    Report Status PENDING  Incomplete   Organism ID, Bacteria KLEBSIELLA PNEUMONIAE (A)  Final      Susceptibility   Klebsiella pneumoniae - MIC*    AMPICILLIN RESISTANT Resistant     CEFAZOLIN  (URINE) Value in next row Sensitive      2 SENSITIVEThis is a modified FDA-approved test that has been validated and its performance characteristics determined by the reporting laboratory.  This laboratory is certified under the Clinical Laboratory Improvement Amendments CLIA as qualified to perform high complexity clinical laboratory testing.    CEFEPIME  Value in next row Sensitive      2 SENSITIVEThis is a modified FDA-approved test that has been validated and its performance characteristics determined by the reporting laboratory.  This laboratory is certified under the Clinical Laboratory Improvement Amendments CLIA as qualified to perform high complexity clinical laboratory testing.    ERTAPENEM Value in next row Sensitive      2 SENSITIVEThis is a modified FDA-approved test that has been validated and its performance characteristics determined by the reporting  laboratory.  This laboratory is certified under the Clinical Laboratory Improvement Amendments CLIA as qualified to perform high complexity clinical laboratory testing.    CEFTRIAXONE  Value in next row Sensitive      2 SENSITIVEThis is a modified FDA-approved test that has been validated and its performance characteristics determined by the reporting laboratory.  This laboratory is certified under the Clinical Laboratory Improvement Amendments CLIA as qualified to perform high complexity clinical laboratory testing.    CIPROFLOXACIN  Value in next row Sensitive      2 SENSITIVEThis is a modified FDA-approved test that has been validated and its performance characteristics determined by the reporting laboratory.  This laboratory is certified under the Clinical Laboratory Improvement Amendments CLIA as qualified to perform high complexity clinical laboratory testing.    GENTAMICIN Value in next row Sensitive      2 SENSITIVEThis is a modified FDA-approved test that has been validated and its performance characteristics determined by the reporting laboratory.  This laboratory is certified under the Clinical Laboratory Improvement Amendments CLIA as qualified to perform high complexity clinical laboratory testing.    NITROFURANTOIN Value in next row Intermediate      2 SENSITIVEThis is a modified FDA-approved test that has been validated and its performance characteristics determined by the reporting laboratory.  This laboratory is certified under the Clinical Laboratory Improvement Amendments CLIA as qualified to perform high complexity clinical laboratory testing.    TRIMETH/SULFA Value in next row Sensitive      2 SENSITIVEThis is a modified FDA-approved test that has been validated and its performance characteristics determined by the reporting laboratory.  This laboratory is certified under the Clinical Laboratory Improvement Amendments CLIA as qualified to perform high complexity clinical laboratory  testing.    AMPICILLIN/SULBACTAM Value in next row Sensitive      2 SENSITIVEThis is a modified FDA-approved test that  has been validated and its performance characteristics determined by the reporting laboratory.  This laboratory is certified under the Clinical Laboratory Improvement Amendments CLIA as qualified to perform high complexity clinical laboratory testing.    PIP/TAZO Value in next row Sensitive      <=4 SENSITIVEThis is a modified FDA-approved test that has been validated and its performance characteristics determined by the reporting laboratory.  This laboratory is certified under the Clinical Laboratory Improvement Amendments CLIA as qualified to perform high complexity clinical laboratory testing.    MEROPENEM Value in next row Sensitive      <=4 SENSITIVEThis is a modified FDA-approved test that has been validated and its performance characteristics determined by the reporting laboratory.  This laboratory is certified under the Clinical Laboratory Improvement Amendments CLIA as qualified to perform high complexity clinical laboratory testing.    * >=100,000 COLONIES/mL KLEBSIELLA PNEUMONIAE         Radiology Studies: No results found.         LOS: 4 days   Time spent= 35 mins    Burgess JAYSON Dare, MD [facility]  If 7PM-7AM, please contact night-coverage  12/29/2024, 10:53 AM  "

## 2024-12-29 NOTE — TOC Progression Note (Signed)
 Transition of Care [facility]) - Progression Note    Patient Details  Name: BENJI POYNTER MRN: [id number] Date of Birth: 08/06/1946  Transition of Care [facility]) CM/SW Contact  Lauraine FORBES Saa, LCSWA Phone Number: 12/29/2024, 11:34 AM  Clinical Narrative:     11:34 AM [facility] SNF informed CSW that they could admit patient tomorrow (room 130 report [id number]). CSW made bedside RN aware who informed CSW and SNF that palliative care and chaplain consults were placed. CSW will continue to follow.   Expected Discharge Plan: Skilled Nursing Facility Barriers to Discharge: Continued Medical Work up, English As A Second Language Teacher               Expected Discharge Plan and Services In-house Referral: Clinical Social Work     Living arrangements for the past 2 months: Single Family Home, Skilled Nursing Facility                                       Social Drivers of Health (SDOH) Interventions SDOH Screenings   Food Insecurity: No Food Insecurity (12/29/2024)  Housing: Low Risk (12/29/2024)  Transportation Needs: No Transportation Needs (12/29/2024)  Utilities: Not At Risk (12/29/2024)  Depression (PHQ2-9): High Risk (07/26/2023)  Social Connections: Moderately Isolated (12/29/2024)  Tobacco Use: Medium Risk (12/25/2024)    Readmission Risk Interventions    12/10/2024    9:44 AM 02/11/2023   12:02 PM  Readmission Risk Prevention Plan  Medication Screening Complete   Transportation Screening Complete Complete  PCP or Specialist Appt within 5-7 Days  Complete  Home Care Screening  Complete  Medication Review (RN CM)  Complete

## 2024-12-29 NOTE — Progress Notes (Signed)
 Subjective: Patient reports patient states does not feel well  Objective: Vital signs in last 24 hours: Temp:  [97.7 F (36.5 C)-98.6 F (37 C)] 97.7 F (36.5 C) (01/02 0343) Pulse Rate:  [69-86] 79 (01/02 0343) Resp:  [14-23] 17 (01/02 0343) BP: (126-177)/(62-88) 172/86 (01/02 0343) SpO2:  [95 %-100 %] 100 % (01/02 0343) Weight:  [86.6 kg-87.3 kg] 87.3 kg (01/02 0343)  Intake/Output from previous day: 01/01 0701 - 01/02 0700 In: 500 [P.O.:350; I.V.:150] Out: 3576 [Urine:3575; Stool:1] Intake/Output this shift: Total I/O In: -  Out: 1301 [Urine:1300; Stool:1]  Awake alert moves all extremities well with generalized weakness  Lab Results: Recent Labs    12/28/24 0259 12/29/24 0159  WBC 21.2* 16.4*  HGB 10.9* 11.2*  HCT 33.7* 35.0*  PLT 330 284   BMET Recent Labs    12/28/24 0259 12/29/24 0159  NA 143 143  K 4.7 4.8  CL 109 111  CO2 21* 22  GLUCOSE 98 98  BUN 88* 73*  CREATININE 2.83* 2.48*  CALCIUM 8.8* 8.4*    Studies/Results: No results found.  Assessment/Plan: Patient is 2 weeks status post thoracic stabilization admitted with urosepsis underwent stenting on antibiotics.  Neurologically patient appears to be at his baseline he was in rehab recovering.  CT scan shows hardware to be in adequate position.  Continue to work with physical occupational therapy incision looks good patient was seen yesterday by my nurse practitioner as well.  Recommend continued antibiotics and rehab.  LOS: 4 days     Zachary [name] 12/29/2024, 5:39 AM

## 2024-12-29 NOTE — Care Management Important Message (Signed)
 Important Message  Patient Details  Name: Zachary [name] MRN: [id number] Date of Birth: [birth year]   Important Message Given:  Yes - Medicare IM     [name] 12/29/2024, 3:06 PM

## 2024-12-29 NOTE — Anesthesia Postprocedure Evaluation (Signed)
"   Anesthesia Post Note  Patient: Zachary [name]  Procedure(s) Performed: CYSTOSCOPY, WITH RETROGRADE PYELOGRAM AND URETERAL STENT INSERTION (Right)     Patient location during evaluation: SICU Anesthesia Type: General Level of consciousness: awake and alert Pain management: pain level controlled Vital Signs Assessment: post-procedure vital signs reviewed and stable Respiratory status: spontaneous breathing, nonlabored ventilation, respiratory function stable and patient connected to nasal cannula oxygen  Cardiovascular status: blood pressure returned to baseline and stable Postop Assessment: no apparent nausea or vomiting Anesthetic complications: no   No notable events documented.                 Franky JONETTA Bald      "
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
## 2024-12-29 NOTE — Progress Notes (Signed)
 Speech Language Pathology Treatment: Dysphagia  Patient Details Name: Zachary [name] MRN: [id number] DOB: [birth year] Today's Date: 12/29/2024 Time: 1430-1450 SLP Time Calculation (min) (ACUTE ONLY): 20 min  Assessment / Plan / Recommendation Clinical Impression  Recommend Dysphagia 2(minced)per pt request d/t poor dentition/mastication with solids;thin liquids with ST f/u for diet tolerance/dysphagia tx during acute stay.  Pt seen for dysphagia tx with nursing staff reporting increased nausea and palliative consult initiated.  Pt agreeable to brief trial of thin via straw with min verbal cues for sip size d/t sub-optimal positioning from recent back sx.  Pt repeated information/questions during session with concern for either medication side effects vs short-term memory deficit.  May consider a speech/language cognitive assessment prn if symptoms persist.  Pt consumed thin via straw or cup sip, single ice chips and solid consistency (saltine cracker d/t nausea) with impaired mastication efforts noted d/t poor dentition/pain with chewing.  Recommend downgrade to Dysphagia 2(minced) to A with mastication efforts and progress to thin liquids with small sips during consumption.  ST will continue to f/u in acute setting.     HPI HPI: Zachary [name] is a 79 yo M who was admitted from [facility] with AMS, hypotension, tachycardia, and hypoxia. Found to have septic shock 2/2 ureteral stone now s/p cystoscopy and stent placement. Chest CT 12/29 with Small bilateral pleural effusions. Bibasilar atelectasis. No focal consolidation or pulmonary edema. No pneumothorax. Head CT 12/29 with no acute findings. PMH significant for HTN, HLD, CKD 3b, bladder cancer s/p multiple surgeries and TURBT (12/2022), low-grade papillary urethral carcinoma, urinary retention requiring intermittent catheterizations 3-4x/ day, pre-DM, OSA, chronic pain, nephrolithiasis, anxiety, and depression. Recent hospitalization with T9  vertebral fracture after mechanical fall s/p posterior decompression and fusion from T7-T11 on 12/17 with Dr. Onetha discharged to [facility] rehab 12/23.  BSE completed with recommendation for Regular/nectar-thickened liquids.  ST f/u for diet tolerance/advancement.      SLP Plan  Continue with current plan of care        Swallow Evaluation Recommendations   Recommendations: PO diet PO Diet Recommendation: Dysphagia 2 (Finely chopped);Thin liquids (Level 0) Liquid Administration via: Cup;Straw;Other (Comment) (small sips) Medication Administration: Whole meds with puree Supervision: Staff to assist with self-feeding;Full supervision/cueing for swallowing strategies Oral care recommendations: Oral care BID (2x/day);Staff/trained caregiver to provide oral care     Recommendations PO diet (Dysphagia 2(minced)/Thin liquids via small sips                    Oral care BID;Staff/trained caregiver to provide oral care   Frequent or constant Supervision/Assistance Dysphagia, oropharyngeal phase (R13.12)     Continue with current plan of care     [name],M.S., CCC-SLP  12/29/2024, 3:13 PM

## 2024-12-30 ENCOUNTER — Inpatient Hospital Stay (HOSPITAL_COMMUNITY)

## 2024-12-30 DIAGNOSIS — R319 Hematuria, unspecified: Secondary | ICD-10-CM

## 2024-12-30 DIAGNOSIS — Z515 Encounter for palliative care: Secondary | ICD-10-CM | POA: Diagnosis not present

## 2024-12-30 DIAGNOSIS — N200 Calculus of kidney: Secondary | ICD-10-CM

## 2024-12-30 DIAGNOSIS — R6521 Severe sepsis with septic shock: Secondary | ICD-10-CM | POA: Diagnosis not present

## 2024-12-30 DIAGNOSIS — N390 Urinary tract infection, site not specified: Secondary | ICD-10-CM

## 2024-12-30 DIAGNOSIS — Z7189 Other specified counseling: Secondary | ICD-10-CM

## 2024-12-30 DIAGNOSIS — N179 Acute kidney failure, unspecified: Secondary | ICD-10-CM | POA: Diagnosis not present

## 2024-12-30 DIAGNOSIS — F4323 Adjustment disorder with mixed anxiety and depressed mood: Secondary | ICD-10-CM | POA: Diagnosis not present

## 2024-12-30 DIAGNOSIS — L8990 Pressure ulcer of unspecified site, unspecified stage: Secondary | ICD-10-CM | POA: Insufficient documentation

## 2024-12-30 DIAGNOSIS — A419 Sepsis, unspecified organism: Secondary | ICD-10-CM | POA: Diagnosis not present

## 2024-12-30 LAB — GLUCOSE, CAPILLARY
Glucose-Capillary: 95 mg/dL (ref 70–99)
Glucose-Capillary: 87 mg/dL (ref 70–99)
Glucose-Capillary: 99 mg/dL (ref 70–99)
Glucose-Capillary: 93 mg/dL (ref 70–99)
Glucose-Capillary: 91 mg/dL (ref 70–99)

## 2024-12-30 LAB — BASIC METABOLIC PANEL WITH GFR
Anion gap: 9 (ref 5–15)
BUN: 55 mg/dL — ABNORMAL HIGH (ref 8–23)
CO2: 22 mmol/L (ref 22–32)
Calcium: 8.4 mg/dL — ABNORMAL LOW (ref 8.9–10.3)
Chloride: 113 mmol/L — ABNORMAL HIGH (ref 98–111)
Creatinine, Ser: 2.21 mg/dL — ABNORMAL HIGH (ref 0.61–1.24)
GFR, Estimated: 30 mL/min — ABNORMAL LOW
Glucose, Bld: 95 mg/dL (ref 70–99)
Potassium: 4.9 mmol/L (ref 3.5–5.1)
Sodium: 145 mmol/L (ref 135–145)

## 2024-12-30 LAB — HEPATIC FUNCTION PANEL
ALT: 22 U/L (ref 0–44)
AST: 22 U/L (ref 15–41)
Albumin: 2.6 g/dL — ABNORMAL LOW (ref 3.5–5.0)
Alkaline Phosphatase: 147 U/L — ABNORMAL HIGH (ref 38–126)
Bilirubin, Direct: 0.1 mg/dL (ref 0.0–0.2)
Indirect Bilirubin: 0.2 mg/dL — ABNORMAL LOW (ref 0.3–0.9)
Total Bilirubin: 0.3 mg/dL (ref 0.0–1.2)
Total Protein: 5.9 g/dL — ABNORMAL LOW (ref 6.5–8.1)

## 2024-12-30 LAB — CBC
HCT: 33.8 % — ABNORMAL LOW (ref 39.0–52.0)
Hemoglobin: 10.7 g/dL — ABNORMAL LOW (ref 13.0–17.0)
MCH: 28.5 pg (ref 26.0–34.0)
MCHC: 31.7 g/dL (ref 30.0–36.0)
MCV: 90.1 fL (ref 80.0–100.0)
Platelets: 264 K/uL (ref 150–400)
RBC: 3.75 MIL/uL — ABNORMAL LOW (ref 4.22–5.81)
RDW: 14.5 % (ref 11.5–15.5)
WBC: 26.2 K/uL — ABNORMAL HIGH (ref 4.0–10.5)
nRBC: 0 % (ref 0.0–0.2)

## 2024-12-30 LAB — CULTURE, BLOOD (ROUTINE X 2)
Culture: NO GROWTH
Culture: NO GROWTH
Special Requests: ADEQUATE
Special Requests: ADEQUATE

## 2024-12-30 LAB — VITAMIN B12: Vitamin B-12: 1023 pg/mL — ABNORMAL HIGH (ref 180–914)

## 2024-12-30 LAB — MAGNESIUM: Magnesium: 1.8 mg/dL (ref 1.7–2.4)

## 2024-12-30 LAB — AMMONIA: Ammonia: 29 umol/L (ref 9–35)

## 2024-12-30 LAB — TSH: TSH: 2.82 u[IU]/mL (ref 0.350–4.500)

## 2024-12-30 LAB — FOLATE: Folate: 8.9 ng/mL

## 2024-12-30 MED ORDER — IOHEXOL 9 MG/ML PO SOLN
500.0000 mL | ORAL | Status: AC
  Administered 2024-12-30: 500 mL via ORAL

## 2024-12-30 MED ORDER — PIPERACILLIN-TAZOBACTAM 3.375 G IVPB
3.3750 g | Freq: Three times a day (TID) | INTRAVENOUS | Status: AC
  Administered 2024-12-30 – 2025-01-03 (×13): 3.375 g via INTRAVENOUS
  Filled 2024-12-30 (×13): qty 50

## 2024-12-30 MED ORDER — MIRTAZAPINE 15 MG PO TABS
7.5000 mg | ORAL_TABLET | Freq: Every day | ORAL | Status: DC
  Administered 2024-12-30 – 2025-01-09 (×11): 7.5 mg via ORAL
  Filled 2024-12-30 (×11): qty 1

## 2024-12-30 MED ORDER — LINEZOLID 600 MG PO TABS
600.0000 mg | ORAL_TABLET | Freq: Two times a day (BID) | ORAL | Status: DC
  Administered 2024-12-30 – 2025-01-08 (×19): 600 mg via ORAL
  Filled 2024-12-30 (×21): qty 1

## 2024-12-30 MED ORDER — LORAZEPAM 2 MG/ML IJ SOLN
0.5000 mg | Freq: Every day | INTRAMUSCULAR | Status: DC | PRN
  Administered 2024-12-31 – 2025-01-03 (×4): 0.5 mg via INTRAVENOUS
  Filled 2024-12-30 (×4): qty 1

## 2024-12-30 NOTE — Plan of Care (Signed)
   Problem: Education: Goal: Knowledge of General Education information will improve Description Including pain rating scale, medication(s)/side effects and non-pharmacologic comfort measures Outcome: Progressing
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
## 2024-12-30 NOTE — Progress Notes (Signed)
" °   12/30/24 1735  Spiritual Encounters  Type of Visit Initial  Care provided to: Patient  Conversation partners present during encounter Nurse  Referral source Clinical staff;Patient request  Reason for visit Urgent spiritual support  OnCall Visit Yes  Spiritual Framework  Presenting Themes Meaning/purpose/sources of inspiration;Goals in life/care;Community and relationships  Community/Connection Faith community  Patient Stress Factors Family relationships   Chaplain responded to a consult. Patient expressed a desire for prayer of understanding regarding all the things that are happening to him (medically and otherwise). Chaplain offered prayer and support. Patient indicated that his pastor from a local church in [place] had been by to visit today and he appreciated that support. Patient asked for some water  - and acknowledged a pending test that is currently delaying fluids. Chaplain checked in with nursing staff and that test should be underway soon. Chaplain introduced spiritual care services. Spiritual care services available as needed.   Juliene CHRISTELLA Das, Chaplain 12/30/2024  "

## 2024-12-30 NOTE — Consult Note (Signed)
 [facility] Psychiatric Consult Initial  Patient Name: .Zachary [name]  MRN: [id number]  DOB: 08/04/1946  Consult Order details:  Orders (From admission, onward)     Start     Ordered   12/30/24 0728  IP CONSULT TO PSYCHIATRY       Ordering Provider: Caleen Burgess BROCKS, MD  Provider:  (Not yet assigned)  Question Answer Comment  Location [facility]   Reason for Consult? agitation, freq depressive episodes      12/30/24 0727             Mode of Visit: In person    Psychiatry Consult Evaluation  Service Date: December 30, 2024 LOS:  LOS: 5 days  Chief Complaint I had an episode of frustration and voiced he did not want to live like this.  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 male admitted: Medicallyfor 12/25/2024  9:19 AM per Dr. Caleen, with PMH as below significant for HTN, HLD, CKD 3b, bladder cancer s/p multiple surgeries and TURBT (12/2022), low-grade papillary urethral carcinoma, urinary retention requiring intermittent catheterizations 3-4x/ day, pre-DM, OSA, chronic pain, nephrolithiasis, anxiety, and depression. Recent hospitalization for T9 vertebral fracture due to mechanical fall requiring decompression and fusion of thoracic spine discharged to rehab on 12/23. Returns back to the hospital for altered mental status hypotension, tachycardia and hypoxia. Admitted to the ICU for septic shock secondary to UTI.  His current presentation of worry that is difficult to control, affecting his concentration, dysphoric mood, feelings of helplessness, decrease in energy and motivation is most consistent with adjustment disorder with mixed disturbance of depression and anxiety.  Current outpatient psychotropic medications include Cymbalta  and Trazodone  and historically he has had a semi positive response to these medications. He was compliant with medications prior to  admission as evidenced by self-report. On initial examination, patient had just drank contrast for a test and not feeling well. Please see plan below for detailed recommendations.   Diagnoses:  Active Hospital problems: Principal Problem:   Septic shock (HCC) Active Problems:   Adjustment disorder with mixed anxiety and depressed mood   Acute pyonephrosis   AKI (acute kidney injury)   Pressure injury of skin    Plan   ## Psychiatric Medication Recommendations:  -Continue Cymbalta  30 mg BID -Continue Trazodone  25 mg at bedtime and melatonin 3 mg at bedtime -Start Remeron  7.5 mg at bedtime -Start Ativan  daily 0.5 mg IV PRN anxiety  ## Medical Decision Making Capacity: Not specifically addressed in this encounter  ## Further Work-up:  -- most recent EKG on 12/25/2024 had QtC of 455 with HR of 114 -- Pertinent labwork reviewed earlier this admission includes: CBC, TSH, chem panel, metabolic panel, U/A, ammonia level   ## Disposition:-- There are no psychiatric contraindications to discharge at this time  ## Behavioral / Environmental: - No specific recommendations at this time.     ## Safety and Observation Level:  - Based on my clinical evaluation, I estimate the patient to be at low risk of self harm in the current setting. - At this time, we recommend  routine. This decision is based on my review of the chart including patient's history and current presentation, interview of the patient, mental status examination, and consideration of suicide risk including evaluating suicidal ideation, plan, intent, suicidal or self-harm behaviors, risk factors, and protective factors. This judgment is based on our ability  to directly address suicide risk, implement suicide prevention strategies, and develop a safety plan while the patient is in the clinical setting. Please contact our team if there is a concern that risk level has changed.  CSSR Risk Category:C-SSRS RISK CATEGORY: No  Risk  Suicide Risk Assessment: Patient has following modifiable risk factors for suicide: triggering events, which we are addressing by discussing stressors with the clients, . Patient has following non-modifiable or demographic risk factors for suicide: male gender Patient has the following protective factors against suicide: Supportive family, Supportive friends, no history of suicide attempts, and no history of NSSIB  Thank you for this consult request. Recommendations have been communicated to the primary team.  We will follow up tomorrow to evaluate his medications that were started.   Sharlot Becker, NP       History of Present Illness  Relevant Aspects of [facility] Course:  Admitted on 12/25/2024 per Dr. Caleen, with PMH as below significant for HTN, HLD, CKD 3b, bladder cancer s/p multiple surgeries and TURBT (12/2022), low-grade papillary urethral carcinoma, urinary retention requiring intermittent catheterizations 3-4x/ day, pre-DM, OSA, chronic pain, nephrolithiasis, anxiety, and depression. Recent hospitalization for T9 vertebral fracture due to mechanical fall requiring decompression and fusion of thoracic spine discharged to rehab on 12/23. Returns back to the hospital for altered mental status hypotension, tachycardia and hypoxia. Admitted to the ICU for septic shock secondary to UTI.  Patient Report:  79 yo male admitted after a cascade of health issues.  Depression moderate with no suicidal ideations.  When asked about his passive suicidal comment of not wanting to live like this, he said I had an episode of being frustrated at the time.  I don't want to harm myself, I just want to feel better.  His worry is high and would like something to help him calm during times of frustration.  His sleep is not great, I don't get much here, appetite is usually good.  Prior to admission, most likely r/t to delirium and his UTI he was seeing people going in and out of closets, none  recently, denies auditory hallucinations and paranoia.    Psych ROS:  Depression: moderate Anxiety:  high Mania (lifetime and current): denied Psychosis: (lifetime and current): visual hallucinations prior to admission related to delirium with the UTI most likely, none presently.   Review of Systems  Constitutional:  Positive for malaise/fatigue.  HENT: Negative.    Eyes: Negative.   Respiratory: Negative.    Cardiovascular: Negative.   Gastrointestinal:  Positive for nausea.  Genitourinary: Negative.   Musculoskeletal:  Positive for myalgias.  Skin: Negative.   Neurological: Negative.   Endo/Heme/Allergies: Negative.   Psychiatric/Behavioral:  Positive for depression. The patient is nervous/anxious and has insomnia.      Psychiatric and Social History  Psychiatric History:  Information collected from patient and chart.  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 Vital Signs:  Temp:  [97.9 F (36.6 C)-98.6 F (37 C)] 98 F (36.7 C) (01/03 1124) Pulse Rate:  [71-91] 85 (01/03 0438) Resp:  [16-20] 20 (01/03 0438) BP: (139-161)/(73-90) 153/82 (01/03 1124) SpO2:  [95 %-98 %]  95 % (01/03 0438) Weight:  [86.8 kg] 86.8 kg (01/03 0438) Blood pressure (!) 153/82, pulse 85, temperature 98 F (36.7 C), temperature source Oral, resp. rate 20, height 6' 1.5 (1.867 m), weight 86.8 kg, SpO2 95%. Body mass index is 24.9 kg/m.  Physical Exam Vitals and nursing note reviewed.  HENT:     Head: Normocephalic.     Nose: Nose normal.  Pulmonary:     Effort: Pulmonary  effort is normal.  Neurological:     Mental Status: He is alert.     Mental Status Exam: General Appearance: Casual  Orientation:  person, place, and situation  Memory:  Immediate;   Fair Recent;   Fair Remote;   Fair  Concentration:  Concentration: Good and Attention Span: Good  Recall:  Fair  Attention  Good  Eye Contact:  Fair  Speech:  Normal Rate  Language:  Good  Volume:  Normal  Mood: depressed, anxious  Affect:  Appropriate  Thought Process:  Coherent  Thought Content:  Logical  Suicidal Thoughts:  No  Homicidal Thoughts:  No  Judgement:  Good  Insight:  Fair  Psychomotor Activity:  Decreased  Akathisia:  No  Fund of Knowledge:  Good      Assets:  Housing Resilience Social Support  Cognition:  WNL  ADL's:  Impaired  AIMS (if indicated):        Other History   These have been pulled in through the EMR, reviewed, and updated if appropriate.  Family History:  The patient's family history includes Alcohol  abuse in his maternal grandfather and mother; Anxiety disorder in his sister; COPD in his mother; Cancer in his father; Depression in his mother; Hypertension in his father, mother, and sister.  Medical History: Past Medical History:  Diagnosis Date   Anxiety    takes Valium  as needed   Arthritis    Bladder cancer (HCC)    takes Rapaflo daily   Blood dyscrasia    07/08/16: pt told he was a free bleeder after bleeding a lot when derm cut off mole on forehead. No excessive bleeding with minor wounds at home, no bleeding problems perioperatively with prior surgeries.   Chronic back pain    spondylolisthesis   Cluster headaches    Depression    takes Lexapro  daily   Essential hypertension, benign    takes Diovan -HCT daily   Hearing loss    hearing aids   History of kidney stones    Hx of complications due to general anesthesia    Aborted surgery 07/15/2016 due to hypotension per pt   Intertrochanteric fracture of left femur (HCC) 02/09/2023   Joint  pain    Kidney stone 03/2019   passed stone   Obstructive sleep apnea on CPAP    uses CPAP @ night   Parkinsonism (HCC)    Pneumonia    several times--last time about 3-4 yrs ago   Pre-diabetes    Prediabetes    Syncopal episodes    Thyroid  condition     Surgical History: Past Surgical History:  Procedure Laterality Date   ANTERIOR CERVICAL DECOMP/DISCECTOMY FUSION N/A 06/05/2013   Procedure:  C3-4 Anterior Cervical Discectomy and Fusion, Allograft, Plate;  Surgeon: Oneil JAYSON Herald, MD;  Location: [facility] OR;  Service: Orthopedics;  Laterality: N/A;  C3-4 Anterior Cervical Discectomy and Fusion, Allograft, Plate   Arthroscopic knee surgery     BACK SURGERY      x 2   BLADDER SURGERY     x  5 to remove tumor   CARPAL TUNNEL RELEASE     CATARACT EXTRACTION W/PHACO  12/19/2012   Procedure: CATARACT EXTRACTION PHACO AND INTRAOCULAR LENS PLACEMENT (IOC);  Surgeon: Cherene Mania, MD;  Location: [facility] ORS;  Service: Ophthalmology;  Laterality: Left;  CDE: 26.80   CATARACT EXTRACTION W/PHACO  01/09/2013   Procedure: CATARACT EXTRACTION PHACO AND INTRAOCULAR LENS PLACEMENT (IOC);  Surgeon: Cherene Mania, MD;  Location: [facility];  Service: Ophthalmology;  Laterality: Right;  CDE:22.83   CERVICAL FUSION  06/05/2013   C 3  C4   CYSTOSCOPY     CYSTOSCOPY W/ URETERAL STENT PLACEMENT Bilateral 02/12/2020   Procedure: CYSTOSCOPY WITH RETROGRADE PYELOGRAM/URETERAL STENT PLACEMENT;  Surgeon: Watt Rush, MD;  Location: [facility];  Service: Urology;  Laterality: Bilateral;   CYSTOSCOPY W/ URETERAL STENT PLACEMENT Right 12/25/2024   Procedure: CYSTOSCOPY, WITH RETROGRADE PYELOGRAM AND URETERAL STENT INSERTION;  Surgeon: Watt Rush, MD;  Location: [facility] OR;  Service: Urology;  Laterality: Right;   INTRAMEDULLARY (IM) NAIL INTERTROCHANTERIC Left 02/10/2023   Procedure: LEFT INTRAMEDULLARY (IM) NAILING OF LEFT FEMUR;  Surgeon: Kendal Franky SQUIBB, MD;  Location: [facility] OR;  Service: Orthopedics;  Laterality: Left;   LAMINECTOMY WITH  POSTERIOR LATERAL ARTHRODESIS LEVEL 4 N/A 12/13/2024   Procedure: Thoracic Seven-Thoracic Eight - Thoracic Eight-Thoracic Nine - Thoracic Nine-Thoracic Ten, Thoracic Ten-Thoracic Eleven pedicle screw fixation and posterolateral arthrodesis decompression of Thoracic Eight;  Surgeon: Onetha Kuba, MD;  Location: [facility] OR;  Service: Neurosurgery;  Laterality: N/A;  T7-T8 - T8-T9 - T9-T10, T10-11 pedicle screw fixation and posterolateral arthrodesis  decompress   LITHOTRIPSY  02/2020   REFRACTIVE SURGERY     Hx: of   RETINAL DETACHMENT SURGERY     TOTAL KNEE ARTHROPLASTY Left 02/01/2018   Procedure: TOTAL KNEE ARTHROPLASTY;  Surgeon: Margrette Taft BRAVO, MD;  Location: [facility];  Service: Orthopedics;  Laterality: Left;   TRANSURETHRAL RESECTION OF BLADDER TUMOR Right 07/27/2015   Procedure: TRANSURETHRAL RESECTION OF BLADDER TUMOR (TURBT);  Surgeon: Arlena Gal, MD;  Location: [facility];  Service: Urology;  Laterality: Right;   TRANSURETHRAL RESECTION OF BLADDER TUMOR WITH MITOMYCIN -C N/A 01/25/2023   Procedure: TRANSURETHRAL RESECTION OF BLADDER TUMOR WITH GEMCITABINE ;  Surgeon: Carolee Sherwood JONETTA DOUGLAS, MD;  Location: [facility];  Service: Urology;  Laterality: N/A;   wisdom tooth extraction       Medications:  Current Medications[1]  Allergies: Allergies[2]  Sharlot Becker, NP     [1]  Current Facility-Administered Medications:    acetaminophen  (TYLENOL ) tablet 650 mg, 650 mg, Oral, Q6H PRN, Wilson, Tara N, PA-C, 650 mg at 12/30/24 9543   bisacodyl  (DULCOLAX) suppository 10 mg, 10 mg, Rectal, Daily PRN, Antonetta Moccasin B, NP   carvedilol  (COREG ) tablet 6.25 mg, 6.25 mg, Oral, BID, Wilson, Tara N, PA-C, 6.25 mg at 12/30/24 0902   Chlorhexidine  Gluconate Cloth 2 % PADS 6 each, 6 each, Topical, Daily, Antonetta Moccasin B, NP, 6 each at 12/30/24 9096   dorzolamide -timolol  (COSOPT ) 2-0.5 % ophthalmic solution 1 drop, 1 drop, Both Eyes, BID, Wilson, Tara N, PA-C, 1 drop at 12/30/24 9094   DULoxetine  (CYMBALTA )  DR capsule 60 mg, 60 mg, Oral, BID, Wilson, Tara N, PA-C, 60 mg at 12/30/24 9097   glucagon  (human recombinant) (GLUCAGEN) injection 1 mg, 1 mg, Intravenous, PRN, Amin, Ankit C, MD   guaiFENesin  (ROBITUSSIN) 100 MG/5ML liquid 5 mL, 5 mL, Oral, Q4H PRN, Amin, Ankit C, MD   heparin  injection 5,000 Units, 5,000 Units, Subcutaneous, Q8H, Simpson, Paula B, NP, 5,000  Units at 12/30/24 0744   hydrALAZINE  (APRESOLINE ) injection 10 mg, 10 mg, Intravenous, Q4H PRN, Amin, Ankit C, MD   iohexol  (OMNIPAQUE ) 9 MG/ML oral solution 500 mL, 500 mL, Oral, Q1H, Amin, Ankit C, MD   ipratropium-albuterol  (DUONEB) 0.5-2.5 (3) MG/3ML nebulizer solution 3 mL, 3 mL, Nebulization, Q4H PRN, Amin, Ankit C, MD   latanoprost  (XALATAN ) 0.005 % ophthalmic solution 1 drop, 1 drop, Both Eyes, QHS, Wilson, Tara N, PA-C, 1 drop at 12/29/24 2210   lidocaine  (LIDODERM ) 5 % 1 patch, 1 patch, Transdermal, Q24H, Antonetta Moccasin B, NP, 1 patch at 12/29/24 2209   lidocaine  (PF) (XYLOCAINE ) 1 % injection 5 mL, 5 mL, , , Dixon, Ryan, MD, 5 mL at 12/25/24 0920   linezolid  (ZYVOX ) tablet 600 mg, 600 mg, Oral, Q12H, Amin, Ankit C, MD   melatonin tablet 3 mg, 3 mg, Oral, QHS, Wilson, Tara N, PA-C, 3 mg at 12/29/24 2209   methocarbamol  (ROBAXIN ) injection 500 mg, 500 mg, Intravenous, Q6H PRN, Antonetta Moccasin B, NP, 500 mg at 12/29/24 1200   metoprolol  tartrate (LOPRESSOR ) injection 5 mg, 5 mg, Intravenous, Q4H PRN, Amin, Ankit C, MD   ondansetron  (ZOFRAN ) injection 4 mg, 4 mg, Intravenous, Q6H PRN, Amin, Ankit C, MD, 4 mg at 12/29/24 1322   oxyCODONE  (Oxy IR/ROXICODONE ) immediate release tablet 5 mg, 5 mg, Oral, Q6H PRN, Wilson, Tara N, PA-C, 5 mg at 12/30/24 0456   piperacillin -tazobactam (ZOSYN ) IVPB 3.375 g, 3.375 g, Intravenous, Q8H, Amin, Ankit C, MD   polyethylene glycol (MIRALAX  / GLYCOLAX ) packet 17 g, 17 g, Oral, Daily, Antonetta Moccasin B, NP, 17 g at 12/28/24 9095   pravastatin  (PRAVACHOL ) tablet 10 mg, 10 mg, Oral, QHS, Wilson, Tara N,  PA-C, 10 mg at 12/29/24 2209   traZODone  (DESYREL ) tablet 25 mg, 25 mg, Oral, QHS PRN, Wilson, Tara N, PA-C, 25 mg at 12/29/24 2209 [2]  Allergies Allergen Reactions   Poison Oak Extract Hives and Itching   Sulfa Antibiotics Other (See Comments)    Unknown    Sulfonamide Derivatives Itching and Rash
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
## 2024-12-30 NOTE — Consult Note (Signed)
 "                              Consultation Note Date: 12/30/2024   Patient Name: Zachary [name]  DOB: 1946-06-09  MRN: [id number]  Age / Sex: 79 y.o., male  PCP: Shona Norleen PEDLAR, MD Referring Physician: Caleen Burgess BROCKS, MD  Reason for Consultation: Establishing goals of care  HPI/Patient Profile: 79 y.o. male  with past medical history significant of hypertension, SLE, CKD stage IIIb, bladder cancer status post multiple surgeries and TURBT (12/2022), low-grade papillary urethral carcinoma, urinary retention requiring intermittent catheterization 3-4 times a day, prediabetes, nephrolithiasis, anxiety, chronic, depression, and arthritis admitted on 12/25/2024 from rehab [facility]) with altered mental status, hypotension, tachycardia, and hypoxia.  At baseline, patient is ambulatory and conversant.  Wife reported worsening mentation for 3 days.    Recent hospitalization with T9 vertebral fracture after mechanical fall status post posterior decompression and fusion from T7-T11 on 12/13/2024 with Dr. Onetha.   PMT has been consulted to assist with goals of care conversation. Patient/Family face treatment option decisions, advanced directive decisions and anticipatory care needs.   Family face treatment option decision, advance directive decisions and anticipatory care needs.   This is hospital day #5.  This is a 7-day and 30-day readmission.  Noted for 2 inpatient admissions in the last 6 months.  He was recently admitted from 12/13 to 12/19/2024 due to T9 vertebral fracture.  Clinical Assessment and Goals of Care:  I have reviewed medical records including: EPIC notes: Reviewed hospitalist progress notes from 12/29/2024 detailing treatment plan mainly for septic shock 2/2 urosepsis status post stent placement, metabolic encephalopathy and AKI on CKD 3B.  Metabolic encephalopathy is due to underlying infection, noted for delirium.  Supportive measures in place.  Reviewed neurosurgery progress note from  12/29/2024 detailing treatment plan mainly for vertebral fracture, thoracic spine status post decompression/fusion, currently stable.  Reviewed psychiatry consult note from 12/30/2024, patient is being seen for adjustment disorder with mixed disturbance of depression and anxiety.  Reviewed to track clinical course and prognostication.  MAR: Reviewed PRN meds received over the last 24 hours.  Patient received a total of 4 doses of as needed oxycodone  5 mg over the last 24 hours.  Patient also received as needed trazodone  25 mg last night at 2209.  Reviewed to assess needs for medication adjustment to optimize comfort.  Available advanced directives in ACP: Healthcare power of attorney document and DNR form available on file.  Requested wife to provide a copy of patient's living will. Labs: Reviewed labs from 12/30/2024: Creatinine elevated but improving at 2.21.  Reviewed CBC from 12/29/2024: Improved but ongoing leukocytosis with WBC at 16.4.  Low but stable hemoglobin at 11.2 and hematocrit of 35.0.  Reviewed to assist with prescribing and prognostication  Met with patient at bedside to assess and to discuss diagnosis prognosis, GOC, EOL wishes, disposition and options.  Shortly after, I also did reach out to patient's wife Zachary [name] to discuss goals of care.  I introduced Palliative Medicine as specialized medical care for people living with serious illness. It focuses on providing relief from the symptoms and stress of a serious illness. The goal is to improve quality of life for both the patient and the family.  Created space and opportunity for family to explore thoughts and feelings regarding patient's current medical condition.   Visited patient at bedside. Family member not present. Patient observed resting  quietly in bed, but appears intermittently confused and disoriented. Unable to reliably assess orientation. No clear gestures or verbalizations indicating pain or discomfort. Able to make simple/basic  needs known.   During the encounter, the patient was engaged to review their understanding of the reason for the current hospital admission.  The patient appears to be intermittently confused but was able to state briefly the reason for his current hospital stay.  He states that he is here due to stent placement.  He failed to further explain other important information about his current hospitalization.   The wife was able to articulate the primary reason for hospitalization, describing the recent changes in health status that prompted evaluation and treatment.  She mentioned that the patient was at [facility] rehab facility for rehabilitation after a mechanical fall status post posterior spinal decompression and fusion when the nursing staff reported worsening mentation for 3 days, which prompted a hospital evaluation.  She also demonstrated awareness of the patients ongoing medical conditions including mild dementia and acknowledged how these comorbidities may contribute to the current episode of illness.   The overall severity of the patients illness was discussed with the the patient's wife. it was explained that the patient is experiencing a significant burden of disease, with multiple chronic conditions and an acute illness contributing to a complex clinical picture.  We reviewed that currently the patient is being treated for septic shock secondary to urosepsis, metabolic encephalopathy, and acute kidney injury on chronic kidney injury stage IIIb.  Wife shared with me that patient is also in the early stage of dementia, to which she noticed increasing forgetfulness 6 months ago.  The discussion included education about the high risk for further health decline, potential for setbacks, and the likelihood of recurrent hospitalizations. The wife was informed that, even with recovery from the current episode, the overall prognosis remains guarded due to the cumulative impact of ongoing health  challenges.  The wife acknowledged the ongoing decline and expressed understanding of the disease trajectory.  We discussed the importance of establishing clear goals of care, ensuring that all interventions and treatments align with the patients beliefs, values, and preferences. The patient and family were encouraged to share their wishes and priorities, and we reviewed how our care plan can best support these goals, whether that involves pursuing aggressive interventions or focusing on comfort measures.  I provided education on the different code status options:  Full Code: All possible life-saving measures will be used if the heart or breathing stops. DNR-Limited: Some interventions may be withheld, but other treatments may continue. DNR with pre-arrest interventions. Allow intubation and use of ACLS medications as appropriate DNR Comfort: No resuscitation or aggressive interventions; all care is focused on comfort. DNI: No breathing tube or mechanical ventilation will be used.   Discussed that, given the multiple medical conditions both acute and chronic, and high risk for further decline, the risks and burdens of aggressive interventions often outweigh the potential benefits. Recommended consideration of DNR/DNI status to focus on comfort, dignity, and quality of life.  During my brief conversation with the patient, he clearly articulate in the event of cardiopulmonary arrest, he do not want extraordinary, heroic measures including cardiopulmonary resuscitation, but instead to allow for a natural death.  The wife supports this decision.  I shared with the wife that we will update the CODE STATUS from DNR with prearrest intervention to DNR/DNI.  I explained that DNR/DNI does not change the medical plan and it only comes into  effect after a person has arrested (died).  It is a protective measure to keep us  from harming the patient in their last moments of life.    We further explored the  pathway for care  should the patients condition worsen. The wife was given the opportunity to consider whether they would want escalation in the level of care (such as transfer to intensive care or use of non-invasive ventilation), or if they would prefer to shift to a comfort-focused approach, prioritizing symptom management and quality of life. The wife expressed understanding of these options and the importance of aligning future interventions with the patients goals.  Based on our conversation, the wife voiced that, if the patient clinical condition further declines, that she will opt to transition patient to comfort focused pathway.  I provided education on the philosophy of hospice and palliative care, highlighting the focus on symptom management, comfort, and support for both patient and family. We discussed the different settings in which these services can be provided, including home hospice and residential hospice facilities, and the types of support available in each.   The current medical plan was reviewed, with a focus on treating reversible conditions and medical optimization.  I summarized that based on our conversation today, the wife opted that we will continue to address treatable issues as appropriate, while also allowing time to assess outcomes and adjust the plan as needed, hoping for clinical improvement, always prioritizing the patients comfort and dignity.  If patient's clinical condition further declines, that she is open to transition patient to comfort focused pathway.  A  discussion was had today regarding advanced directives.  Concepts specific to code status, artifical feeding and hydration, continued IV antibiotics and rehospitalization was had.  The difference between a aggressive medical intervention path  and a palliative comfort care path for this patient at this time was had.  Values and goals of care important to patient and family were attempted to be elicited.  The  difference between aggressive medical intervention and comfort care was considered in light of the patient's goals of care. Hospice and Palliative Care services outpatient were explained and offered. Natural trajectory and expectations at EOL were discussed.  Questions and concerns addressed.  Patient  encouraged to call with questions or concerns.     Discussed the importance of continued conversation with family and the medical providers regarding overall plan of care and treatment options, ensuring decisions are within the context of the patients values and GOCs.   Questions and concerns were addressed.  Hard Choices booklet left for review. The family was encouraged to call with questions or concerns.  PMT will continue to support holistically.   Social History: Patient presented from [facility] nursing facility where he was having his rehab post spinal surgery.  Prior to recent hospitalizations, he lives with his wife of 47 years.  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  Functional and Nutritional State: Patient is currently with functional limitation due to acute and chronic illnesses.  At baseline, he is ambulatory.  Appetite has been good.  Palliative Symptoms: Intermittent confusion, generalized weakness, poor oral intake  Advance Directives: A detailed discussion regarding advanced directives was held with the patient and family today, highlighting the benefits of having clear documentation to guide care in accordance with the patients wishes. The patient has previously completed advanced directive documents  and copies are available on file (HCPOA andDNR form).  I requested the wife to provide us  a copy of patient's living will.  Code Status: Changed from DNR with pre-arrest interventions to DNR/DNI  Primary Decision Maker: Zachary [name] (Spouse)  [phone]    SUMMARY OF RECOMMENDATIONS   # Complex medical decision making/goals of care Code Status: Changed from DNR with prearrest intervention to DNR/DNI Goal of care is medical stabilization and recovery to the extent this is possible Per patient's wife, if patient's condition further declines, to transition to comfort focused care Delirium precautions Spiritual care consult placed on 12/30/2024 per patient's request Palliative medicine team will continue to follow.   # Symptom management Per Primary team Palliative medicine is available to assist as needed.    # Psychosocial support Continue to provide psycho-social and emotional support to patient and family  # Discharge planning To be determined  # Treatment plan discussed with:  Patient, patient's wife Garment/textile Technologist, nursing staff, medical team  Code Status/Advance Care Planning: DNR   Palliative Prophylaxis:  Aspiration, Bowel Regimen, Delirium Protocol, Oral Care, and Turn Reposition   Prognosis:  Prognosis is guarded given significant disease burden, and high risk for decompensation  Discharge Planning: To Be Determined         Primary Diagnoses: Present on Admission:  Septic shock [facility])    Physical Exam Constitutional:      Appearance: He is ill-appearing.  HENT:     Head: Normocephalic and atraumatic.     Mouth/Throat:     Mouth: Mucous membranes are moist.  Cardiovascular:     Rate and Rhythm: Normal rate and regular rhythm.  Pulmonary:     Effort: Pulmonary effort is normal.  Abdominal:     General: Abdomen is flat. Bowel sounds are normal.     Palpations: Abdomen is soft.  Musculoskeletal:     Comments: Generalized weakness  Skin:    General: Skin is warm.  Neurological:     Mental Status: He is alert. Mental status is at baseline.     Comments: Intermittent confusion.  Psychiatric:     Comments: Feeling sad and lonely as verbalized.      Vital Signs: BP (!) 154/83 (BP Location:  Left Arm)   Pulse 85   Temp 98.4 F (36.9 C) (Oral)   Resp 20   Ht 6' 1.5 (1.867 m)   Wt 86.8 kg   SpO2 95%   BMI 24.90 kg/m  Pain Scale: 0-10   Pain Score: 7    SpO2: SpO2: 95 % O2 Device:SpO2: 95 % O2 Flow Rate: .O2 Flow Rate (L/min): 4 L/min   Palliative Assessment/Data: 40%    I personally spent a total of 90 minutes in the care of the patient today including preparing to see the patient, getting/reviewing separately obtained history, performing a medically appropriate exam/evaluation, counseling and educating, placing orders, referring and communicating with other health care professionals, documenting clinical information in the EHR, independently interpreting results, communicating results, and coordinating care.    Thank you for allowing us  to participate in the care of Zachary [name]   Kathlyne JULIANNA Tracie Mickey, NP  Palliative Medicine Team Team phone # [phone]  Thank you for allowing the Palliative Medicine Team to assist in the care of this patient. Please utilize secure chat with additional questions, if there is no response within 30 minutes please call the above phone number.  Palliative Medicine Team providers are available by phone from 7am to 7pm daily and  can be reached through the team cell phone.  Should this patient require assistance outside of these hours, please call the patient's attending physician.   "

## 2024-12-31 DIAGNOSIS — F4323 Adjustment disorder with mixed anxiety and depressed mood: Secondary | ICD-10-CM | POA: Diagnosis not present

## 2024-12-31 DIAGNOSIS — Z515 Encounter for palliative care: Secondary | ICD-10-CM | POA: Diagnosis not present

## 2024-12-31 DIAGNOSIS — Z7189 Other specified counseling: Secondary | ICD-10-CM | POA: Diagnosis not present

## 2024-12-31 DIAGNOSIS — A419 Sepsis, unspecified organism: Secondary | ICD-10-CM | POA: Diagnosis not present

## 2024-12-31 DIAGNOSIS — R6521 Severe sepsis with septic shock: Secondary | ICD-10-CM | POA: Diagnosis not present

## 2024-12-31 LAB — BASIC METABOLIC PANEL WITH GFR
Anion gap: 9 (ref 5–15)
BUN: 50 mg/dL — ABNORMAL HIGH (ref 8–23)
CO2: 23 mmol/L (ref 22–32)
Calcium: 8.5 mg/dL — ABNORMAL LOW (ref 8.9–10.3)
Chloride: 110 mmol/L (ref 98–111)
Creatinine, Ser: 2.19 mg/dL — ABNORMAL HIGH (ref 0.61–1.24)
GFR, Estimated: 30 mL/min — ABNORMAL LOW
Glucose, Bld: 96 mg/dL (ref 70–99)
Potassium: 5 mmol/L (ref 3.5–5.1)
Sodium: 143 mmol/L (ref 135–145)

## 2024-12-31 LAB — CBC
HCT: 37.2 % — ABNORMAL LOW (ref 39.0–52.0)
Hemoglobin: 11.7 g/dL — ABNORMAL LOW (ref 13.0–17.0)
MCH: 28.7 pg (ref 26.0–34.0)
MCHC: 31.5 g/dL (ref 30.0–36.0)
MCV: 91.4 fL (ref 80.0–100.0)
Platelets: 300 K/uL (ref 150–400)
RBC: 4.07 MIL/uL — ABNORMAL LOW (ref 4.22–5.81)
RDW: 14.4 % (ref 11.5–15.5)
WBC: 30.3 K/uL — ABNORMAL HIGH (ref 4.0–10.5)
nRBC: 0 % (ref 0.0–0.2)

## 2024-12-31 LAB — GLUCOSE, CAPILLARY
Glucose-Capillary: 104 mg/dL — ABNORMAL HIGH (ref 70–99)
Glucose-Capillary: 108 mg/dL — ABNORMAL HIGH (ref 70–99)
Glucose-Capillary: 110 mg/dL — ABNORMAL HIGH (ref 70–99)
Glucose-Capillary: 118 mg/dL — ABNORMAL HIGH (ref 70–99)
Glucose-Capillary: 119 mg/dL — ABNORMAL HIGH (ref 70–99)
Glucose-Capillary: 121 mg/dL — ABNORMAL HIGH (ref 70–99)
Glucose-Capillary: 86 mg/dL (ref 70–99)

## 2024-12-31 LAB — MAGNESIUM: Magnesium: 1.9 mg/dL (ref 1.7–2.4)

## 2024-12-31 MED ORDER — NYSTATIN 100000 UNIT/ML MT SUSP
5.0000 mL | Freq: Four times a day (QID) | OROMUCOSAL | Status: AC
  Administered 2024-12-31 – 2025-01-06 (×22): 500000 [IU] via ORAL
  Filled 2024-12-31 (×25): qty 5

## 2024-12-31 NOTE — Plan of Care (Signed)
   Problem: Health Behavior/Discharge Planning: Goal: Ability to manage health-related needs will improve Outcome: Progressing

## 2024-12-31 NOTE — Plan of Care (Signed)
  Problem: Health Behavior/Discharge Planning: Goal: Ability to manage health-related needs will improve Outcome: Progressing   Problem: Clinical Measurements: Goal: Ability to maintain clinical measurements within normal limits will improve Outcome: Progressing Goal: Will remain free from infection Outcome: Progressing Goal: Diagnostic test results will improve Outcome: Progressing Goal: Respiratory complications will improve Outcome: Progressing Goal: Cardiovascular complication will be avoided Outcome: Progressing   Problem: Activity: Goal: Risk for activity intolerance will decrease Outcome: Progressing   Problem: Nutrition: Goal: Adequate nutrition will be maintained Outcome: Progressing   Problem: Coping: Goal: Level of anxiety will decrease Outcome: Progressing   Problem: Elimination: Goal: Will not experience complications related to bowel motility Outcome: Progressing Goal: Will not experience complications related to urinary retention Outcome: Progressing   Problem: Pain Managment: Goal: General experience of comfort will improve and/or be controlled Outcome: Progressing   Problem: Safety: Goal: Ability to remain free from injury will improve Outcome: Progressing

## 2024-12-31 NOTE — Consult Note (Signed)
 [facility] Psychiatric Consult Follow Up  Patient Name: .Zachary [name]  MRN: [id number]  DOB: 1946/05/14  Consult Order details:  Orders (From admission, onward)     Start     Ordered   12/30/24 0728  IP CONSULT TO PSYCHIATRY       Ordering Provider: Caleen Burgess BROCKS, MD  Provider:  (Not yet assigned)  Question Answer Comment  Location [facility]   Reason for Consult? agitation, freq depressive episodes      12/30/24 0727             Mode of Visit: In person    Psychiatry Consult Evaluation  Service Date: December 31, 2024 LOS:  LOS: 6 days  Chief Complaint I had an episode of frustration and voiced he did not want to live like this.  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 male admitted: Medicallyfor 12/25/2024  9:19 AM per Dr. Caleen, with PMH as below significant for HTN, HLD, CKD 3b, bladder cancer s/p multiple surgeries and TURBT (12/2022), low-grade papillary urethral carcinoma, urinary retention requiring intermittent catheterizations 3-4x/ day, pre-DM, OSA, chronic pain, nephrolithiasis, anxiety, and depression. Recent hospitalization for T9 vertebral fracture due to mechanical fall requiring decompression and fusion of thoracic spine discharged to rehab on 12/23. Returns back to the hospital for altered mental status hypotension, tachycardia and hypoxia. Admitted to the ICU for septic shock secondary to UTI.  His current presentation of worry that is difficult to control, affecting his concentration, dysphoric mood, feelings of helplessness, decrease in energy and motivation is most consistent with adjustment disorder with mixed disturbance of depression and anxiety.  Current outpatient psychotropic medications include Cymbalta  and Trazodone  and historically he has had a semi positive response to these medications. He was compliant with medications prior  to admission as evidenced by self-report. On initial examination, patient had just drank contrast for a test and not feeling well. Please see plan below for detailed recommendations.   Diagnoses:  Active Hospital problems: Principal Problem:   Septic shock (HCC) Active Problems:   Adjustment disorder with mixed anxiety and depressed mood   Acute pyonephrosis   AKI (acute kidney injury)   Pressure injury of skin    Plan   ## Psychiatric Medication Recommendations:  -Continue Cymbalta  30 mg BID -Continue Trazodone  25 mg at bedtime and melatonin 3 mg at bedtime -Start Remeron  7.5 mg at bedtime -Start Ativan  daily 0.5 mg IV PRN anxiety  ## Medical Decision Making Capacity: Not specifically addressed in this encounter  ## Further Work-up:  -- most recent EKG on 12/25/2024 had QtC of 455 with HR of 114 -- Pertinent labwork reviewed earlier this admission includes: CBC, TSH, chem panel, metabolic panel, U/A, ammonia level   ## Disposition:-- There are no psychiatric contraindications to discharge at this time  ## Behavioral / Environmental: - No specific recommendations at this time.     ## Safety and Observation Level:  - Based on my clinical evaluation, I estimate the patient to be at low risk of self harm in the current setting. - At this time, we recommend  routine. This decision is based on my review of the chart including patient's history and current presentation, interview of the patient, mental status examination, and consideration of suicide risk including evaluating suicidal ideation, plan, intent, suicidal or self-harm behaviors, risk factors, and protective factors. This judgment is based on our  ability to directly address suicide risk, implement suicide prevention strategies, and develop a safety plan while the patient is in the clinical setting. Please contact our team if there is a concern that risk level has changed.  CSSR Risk Category:C-SSRS RISK CATEGORY: No  Risk  Suicide Risk Assessment: Patient has following modifiable risk factors for suicide: triggering events, which we are addressing by discussing stressors with the clients, . Patient has following non-modifiable or demographic risk factors for suicide: male gender Patient has the following protective factors against suicide: Supportive family, Supportive friends, no history of suicide attempts, and no history of NSSIB  Thank you for this consult request. Recommendations have been communicated to the primary team.  We sign off at this time.  Sharlot Becker, NP       History of Present Illness  Relevant Aspects of [facility] Course:  Admitted on 12/25/2024 per Dr. Caleen, with PMH as below significant for HTN, HLD, CKD 3b, bladder cancer s/p multiple surgeries and TURBT (12/2022), low-grade papillary urethral carcinoma, urinary retention requiring intermittent catheterizations 3-4x/ day, pre-DM, OSA, chronic pain, nephrolithiasis, anxiety, and depression. Recent hospitalization for T9 vertebral fracture due to mechanical fall requiring decompression and fusion of thoracic spine discharged to rehab on 12/23. Returns back to the hospital for altered mental status hypotension, tachycardia and hypoxia. Admitted to the ICU for septic shock secondary to UTI.  Patient Report:  79 yo male admitted after a cascade of health issues.  Depression moderate with no suicidal ideations.  When asked about his passive suicidal comment of not wanting to live like this, he said I had an episode of being frustrated at the time.  I don't want to harm myself, I just want to feel better.  His worry is high and would like something to help him calm during times of frustration.  His sleep is not great, I don't get much here, appetite is usually good.  Prior to admission, most likely r/t to delirium and his UTI he was seeing people going in and out of closets, none recently, denies auditory hallucinations and  paranoia.    12/31/2024: The client was calm on assessment.  He stated he felt pretty good with his anxiety not as much, depression is still a little bit.  Continues to deny suicidal ideations.  He slept good with no appetite issues. No side effects from the new medications.  Denied needing anything else except something to drink, assisted with is drink which he took many sips without issues.   Psych ROS:  Depression: moderate Anxiety:  high Mania (lifetime and current): denied Psychosis: (lifetime and current): visual hallucinations prior to admission related to delirium with the UTI most likely, none presently.   Review of Systems  Constitutional:  Positive for malaise/fatigue.  HENT: Negative.    Eyes: Negative.   Respiratory: Negative.    Cardiovascular: Negative.   Gastrointestinal: Negative.   Genitourinary: Negative.   Musculoskeletal:  Positive for myalgias.  Skin: Negative.   Neurological: Negative.   Endo/Heme/Allergies: Negative.   Psychiatric/Behavioral:  Positive for depression. The patient is nervous/anxious.      Psychiatric and Social History  Psychiatric History:  Information collected from patient and chart.  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 Vital Signs:  Temp:  [97.7 F (36.5 C)-98.8 F (37.1 C)] 97.9 F (36.6 C) (01/04 1156) Pulse Rate:  [82-89] 86 (01/04 0418) Resp:  [16-17] 16 (01/04 0418) BP: (127-160)/(75-84) 127/75 (01/04 1156) SpO2:  [95 %-97 %] 97 % (01/04 0418) Weight:  [83.6 kg] 83.6 kg (01/04 0418) Blood pressure  127/75, pulse 86, temperature 97.9 F (36.6 C), temperature source Oral, resp. rate 16, height 6' 1.5 (1.867 m), weight 83.6 kg, SpO2 97%. Body mass index is 23.99 kg/m.  Physical Exam Vitals and nursing note reviewed.  HENT:     Head: Normocephalic.     Nose: Nose normal.  Pulmonary:     Effort: Pulmonary effort is normal.  Neurological:     Mental Status: He is alert.     Mental Status Exam: General Appearance: Casual  Orientation:  person, place, and situation  Memory:  Immediate;   Fair Recent;   Fair Remote;   Fair  Concentration:  Concentration: Good and Attention Span: Good  Recall:  Fair  Attention  Good  Eye Contact:  Fair  Speech:  Normal Rate  Language:  Good  Volume:  Normal  Mood: depressed, anxious  Affect:  Appropriate  Thought Process:  Coherent  Thought Content:  Logical  Suicidal Thoughts:  No  Homicidal Thoughts:  No  Judgement:  Good  Insight:  Fair  Psychomotor Activity:  Decreased  Akathisia:  No  Fund of Knowledge:  Good      Assets:  Housing Resilience Social Support  Cognition:  WNL  ADL's:  Impaired  AIMS (if indicated):        Other History   These have been pulled in through the EMR, reviewed, and updated if appropriate.  Family History:  The patient's family history includes Alcohol  abuse in his maternal grandfather and mother; Anxiety disorder in his sister; COPD in his mother; Cancer in his father; Depression in his mother; Hypertension in his father, mother, and sister.  Medical History: Past Medical History:  Diagnosis Date   Anxiety    takes Valium  as needed   Arthritis    Bladder cancer (HCC)    takes Rapaflo daily   Blood dyscrasia    07/08/16: pt told he was a free bleeder after bleeding a lot when derm cut off mole on forehead. No excessive bleeding with minor wounds at home, no bleeding problems perioperatively with prior surgeries.   Chronic back pain    spondylolisthesis   Cluster headaches    Depression     takes Lexapro  daily   Essential hypertension, benign    takes Diovan -HCT daily   Hearing loss    hearing aids   History of kidney stones    Hx of complications due to general anesthesia    Aborted surgery 07/15/2016 due to hypotension per pt   Intertrochanteric fracture of left femur (HCC) 02/09/2023   Joint pain    Kidney stone 03/2019   passed stone   Obstructive sleep apnea on CPAP    uses CPAP @ night   Parkinsonism (HCC)    Pneumonia    several times--last time about 3-4 yrs ago   Pre-diabetes    Prediabetes    Syncopal episodes    Thyroid  condition     Surgical History: Past Surgical History:  Procedure Laterality Date   ANTERIOR CERVICAL DECOMP/DISCECTOMY FUSION N/A 06/05/2013   Procedure:  C3-4 Anterior  Cervical Discectomy and Fusion, Allograft, Plate;  Surgeon: Oneil JAYSON Herald, MD;  Location: [facility] OR;  Service: Orthopedics;  Laterality: N/A;  C3-4 Anterior Cervical Discectomy and Fusion, Allograft, Plate   Arthroscopic knee surgery     BACK SURGERY      x 2   BLADDER SURGERY     x 5 to remove tumor   CARPAL TUNNEL RELEASE     CATARACT EXTRACTION W/PHACO  12/19/2012   Procedure: CATARACT EXTRACTION PHACO AND INTRAOCULAR LENS PLACEMENT (IOC);  Surgeon: Cherene Mania, MD;  Location: [facility];  Service: Ophthalmology;  Laterality: Left;  CDE: 26.80   CATARACT EXTRACTION W/PHACO  01/09/2013   Procedure: CATARACT EXTRACTION PHACO AND INTRAOCULAR LENS PLACEMENT (IOC);  Surgeon: Cherene Mania, MD;  Location: [facility];  Service: Ophthalmology;  Laterality: Right;  CDE:22.83   CERVICAL FUSION  06/05/2013   C 3  C4   CYSTOSCOPY     CYSTOSCOPY W/ URETERAL STENT PLACEMENT Bilateral 02/12/2020   Procedure: CYSTOSCOPY WITH RETROGRADE PYELOGRAM/URETERAL STENT PLACEMENT;  Surgeon: Watt Rush, MD;  Location: [facility];  Service: Urology;  Laterality: Bilateral;   CYSTOSCOPY W/ URETERAL STENT PLACEMENT Right 12/25/2024   Procedure: CYSTOSCOPY, WITH RETROGRADE PYELOGRAM AND URETERAL STENT INSERTION;   Surgeon: Watt Rush, MD;  Location: [facility] OR;  Service: Urology;  Laterality: Right;   INTRAMEDULLARY (IM) NAIL INTERTROCHANTERIC Left 02/10/2023   Procedure: LEFT INTRAMEDULLARY (IM) NAILING OF LEFT FEMUR;  Surgeon: Kendal Franky SQUIBB, MD;  Location: [facility] OR;  Service: Orthopedics;  Laterality: Left;   LAMINECTOMY WITH POSTERIOR LATERAL ARTHRODESIS LEVEL 4 N/A 12/13/2024   Procedure: Thoracic Seven-Thoracic Eight - Thoracic Eight-Thoracic Nine - Thoracic Nine-Thoracic Ten, Thoracic Ten-Thoracic Eleven pedicle screw fixation and posterolateral arthrodesis decompression of Thoracic Eight;  Surgeon: Onetha Kuba, MD;  Location: [facility] OR;  Service: Neurosurgery;  Laterality: N/A;  T7-T8 - T8-T9 - T9-T10, T10-11 pedicle screw fixation and posterolateral arthrodesis  decompress   LITHOTRIPSY  02/2020   REFRACTIVE SURGERY     Hx: of   RETINAL DETACHMENT SURGERY     TOTAL KNEE ARTHROPLASTY Left 02/01/2018   Procedure: TOTAL KNEE ARTHROPLASTY;  Surgeon: Margrette Taft BRAVO, MD;  Location: [facility];  Service: Orthopedics;  Laterality: Left;   TRANSURETHRAL RESECTION OF BLADDER TUMOR Right 07/27/2015   Procedure: TRANSURETHRAL RESECTION OF BLADDER TUMOR (TURBT);  Surgeon: Arlena Gal, MD;  Location: [facility];  Service: Urology;  Laterality: Right;   TRANSURETHRAL RESECTION OF BLADDER TUMOR WITH MITOMYCIN -C N/A 01/25/2023   Procedure: TRANSURETHRAL RESECTION OF BLADDER TUMOR WITH GEMCITABINE ;  Surgeon: Carolee Sherwood JONETTA DOUGLAS, MD;  Location: [facility];  Service: Urology;  Laterality: N/A;   wisdom tooth extraction       Medications:  Current Medications[1]  Allergies: Allergies[2]  Sharlot Becker, NP      [1]  Current Facility-Administered Medications:    acetaminophen  (TYLENOL ) tablet 650 mg, 650 mg, Oral, Q6H PRN, Wilson, Tara N, PA-C, 650 mg at 12/30/24 0456   bisacodyl  (DULCOLAX) suppository 10 mg, 10 mg, Rectal, Daily PRN, Antonetta Moccasin B, NP   carvedilol  (COREG ) tablet 6.25 mg, 6.25 mg, Oral, BID, Wilson, Tara  N, PA-C, 6.25 mg at 12/31/24 1024   Chlorhexidine  Gluconate Cloth 2 % PADS 6 each, 6 each, Topical, Daily, Antonetta Moccasin B, NP, 6 each at 12/31/24 1027   dorzolamide -timolol  (COSOPT ) 2-0.5 % ophthalmic solution 1 drop, 1 drop, Both Eyes, BID, Wilson, Tara N, PA-C, 1 drop at 12/30/24 2212   DULoxetine  (CYMBALTA ) DR capsule 60 mg, 60 mg, Oral, BID,  Wilson, Tara N, PA-C, 60 mg at 12/31/24 1024   glucagon  (human recombinant) (GLUCAGEN) injection 1 mg, 1 mg, Intravenous, PRN, Amin, Ankit C, MD   guaiFENesin  (ROBITUSSIN) 100 MG/5ML liquid 5 mL, 5 mL, Oral, Q4H PRN, Amin, Ankit C, MD, 5 mL at 12/31/24 1024   heparin  injection 5,000 Units, 5,000 Units, Subcutaneous, Q8H, Simpson, Paula B, NP, 5,000 Units at 12/31/24 1206   hydrALAZINE  (APRESOLINE ) injection 10 mg, 10 mg, Intravenous, Q4H PRN, Amin, Ankit C, MD   ipratropium-albuterol  (DUONEB) 0.5-2.5 (3) MG/3ML nebulizer solution 3 mL, 3 mL, Nebulization, Q4H PRN, Amin, Ankit C, MD   latanoprost  (XALATAN ) 0.005 % ophthalmic solution 1 drop, 1 drop, Both Eyes, QHS, Wilson, Tara N, PA-C, 1 drop at 12/30/24 2213   lidocaine  (LIDODERM ) 5 % 1 patch, 1 patch, Transdermal, Q24H, Antonetta Moccasin B, NP, 1 patch at 12/29/24 2209   lidocaine  (PF) (XYLOCAINE ) 1 % injection 5 mL, 5 mL, , , Dixon, Ryan, MD, 5 mL at 12/25/24 0920   linezolid  (ZYVOX ) tablet 600 mg, 600 mg, Oral, Q12H, Amin, Ankit C, MD, 600 mg at 12/31/24 1025   LORazepam  (ATIVAN ) injection 0.5 mg, 0.5 mg, Intravenous, Daily PRN, Jacquetta Sharlot GRADE, NP, 0.5 mg at 12/31/24 1206   melatonin tablet 3 mg, 3 mg, Oral, QHS, Wilson, Tara N, PA-C, 3 mg at 12/30/24 2205   methocarbamol  (ROBAXIN ) injection 500 mg, 500 mg, Intravenous, Q6H PRN, Antonetta Moccasin B, NP, 500 mg at 12/29/24 1200   metoprolol  tartrate (LOPRESSOR ) injection 5 mg, 5 mg, Intravenous, Q4H PRN, Amin, Ankit C, MD   mirtazapine  (REMERON ) tablet 7.5 mg, 7.5 mg, Oral, QHS, Briceson Broadwater Y, NP, 7.5 mg at 12/30/24 2205   nystatin  (MYCOSTATIN ) 100000  UNIT/ML suspension 500,000 Units, 5 mL, Oral, QID, Amin, Ankit C, MD, 500,000 Units at 12/31/24 1024   ondansetron  (ZOFRAN ) injection 4 mg, 4 mg, Intravenous, Q6H PRN, Amin, Ankit C, MD, 4 mg at 12/31/24 1029   oxyCODONE  (Oxy IR/ROXICODONE ) immediate release tablet 5 mg, 5 mg, Oral, Q6H PRN, Wilson, Tara N, PA-C, 5 mg at 12/31/24 1025   piperacillin -tazobactam (ZOSYN ) IVPB 3.375 g, 3.375 g, Intravenous, Q8H, Amin, Ankit C, MD, Last Rate: 12.5 mL/hr at 12/31/24 1213, 3.375 g at 12/31/24 1213   polyethylene glycol (MIRALAX  / GLYCOLAX ) packet 17 g, 17 g, Oral, Daily, Antonetta Moccasin B, NP, 17 g at 12/31/24 1024   pravastatin  (PRAVACHOL ) tablet 10 mg, 10 mg, Oral, QHS, Wilson, Tara N, PA-C, 10 mg at 12/30/24 2207   traZODone  (DESYREL ) tablet 25 mg, 25 mg, Oral, QHS PRN, Wilson, Tara N, PA-C, 25 mg at 12/30/24 2204 [2]  Allergies Allergen Reactions   Poison Oak Extract Hives and Itching   Sulfa Antibiotics Other (See Comments)    Unknown    Sulfonamide Derivatives Itching and Rash

## 2024-12-31 NOTE — Progress Notes (Signed)
 "  Palliative Medicine Inpatient Follow Up Note   HPI: 79 y.o. male  with past medical history significant of hypertension, SLE, CKD stage IIIb, bladder cancer status post multiple surgeries and TURBT (12/2022), low-grade papillary urethral carcinoma, urinary retention requiring intermittent catheterization 3-4 times a day, prediabetes, nephrolithiasis, anxiety, chronic, depression, and arthritis admitted on 12/25/2024 from rehab [facility] with altered mental status, hypotension, tachycardia, and hypoxia.  At baseline, patient is ambulatory and conversant.  Wife reported worsening mentation for 3 days.     Recent hospitalization with T9 vertebral fracture after mechanical fall status post posterior decompression and fusion from T7-T11 on 12/13/2024 with Dr. Onetha.   PMT has been consulted to assist with goals of care conversation. Patient/Family face treatment option decisions, advanced directive decisions and anticipatory care needs.   This is hospital day #6.  This is a 7-day and 30-day readmission.  Noted for 2 inpatient admissions in the last 6 months.  He was recently admitted from 12/13 to 12/19/2024 due to T9 vertebral fracture.   Today's Discussion 12/31/2024  I have reviewed medical records including:  EPIC notes: Reviewed hospitalist progress notes from 12/30/2024 detailing treatment plan mainly for septic shock, metabolic encephalopathy and AKI on CKD 3B.  AKI on CKD 3B improving with baseline creatinine at 2.0.  Also reviewed recent progress and consult notes from [facility], and psychiatry.  Reviewed to track clinical course and prognostication.  MAR: Reviewed PRN meds received over the last 24 hours. Reviewed to assess needs for medication adjustment to optimize comfort.  Available advanced directives in ACP: HCPOA document and DNR form available on file.  Requested wife to provide a copy of patient's living will. Labs: Labs reviewed from 12/31/2024: Creatinine elevated at 2.19, with eGFR  decreased at 30.  Ongoing leukocytosis with WBC at 13.3.  Hemoglobin low but stable at 11.7 with hematocrit of 37.2.  Reviewed to assist with prognostication.  Patient is observed resting comfortably in bed. No family members present. Not in any form of acute distress. No signs or gestures indicating pain or discomfort. Alert and oriented x 3, able to make needs known. He shared that yesterday was rough, today is better and hoping to continue to get better overall.   I coordinated and discussed plan of care with bedside RN. She reports patient seemingly with ongoing confusion, unable to get settled and with some ongoing agitation.  Patient has an active order for PRN Ativan  0.5mg  in case patient continues to be agitated.   Shortly after I did try to reach out to patient's wife telephonically to provide medical update and to review goals of care. No answer. I left a voice message requesting for a return call.   Goals of care unchanged. Maintain DNR-Limited. Continue current medical interventions, allow time for outcomes in hopes for clinical improvement.   Created space and opportunity for patient to explore thoughts feelings and fears regarding current medical situation.  Patient and her family face treatment option decisions, advanced directive decisions and anticipatory care needs.   Questions and concerns addressed   Palliative Support Provided.   Objective Assessment: Vital Signs Vitals:   12/31/24 0418 12/31/24 0738  BP: (!) 152/80 (!) 143/82  Pulse: 86   Resp: 16   Temp: 97.7 F (36.5 C) 98.8 F (37.1 C)  SpO2: 97%     Intake/Output Summary (Last 24 hours) at 12/31/2024 0906 Last data filed at 12/31/2024 0419 Gross per 24 hour  Intake 678.83 ml  Output 2425 ml  Net -1746.17 ml   Last Weight  Most recent update: 12/31/2024  4:19 AM    Weight  83.6 kg (184 lb 4.9 oz)             Physical Exam Constitutional:      Appearance: He is ill-appearing.  HENT:     Head:  Normocephalic and atraumatic.     Mouth/Throat:     Mouth: Mucous membranes are moist.  Cardiovascular:     Rate and Rhythm: Normal rate.  Pulmonary:     Effort: Pulmonary effort is normal.  Abdominal:     General: Abdomen is flat. Bowel sounds are normal.     Palpations: Abdomen is soft.  Musculoskeletal:     Comments: Generalized weakness   Skin:    General: Skin is warm.  Neurological:     Mental Status: He is alert. Mental status is at baseline.     Comments: Intermittent confusion.     SUMMARY OF RECOMMENDATIONS    # Complex medical decision-making/goals of care  Code Status: Maintain DNR/DNI status Goal of care is medical stabilization and recovery to the extent this is possible Per patient's wife, if patient's condition further declines, to transition to comfort focused care Delirium precautions Spiritual care consult placed on 12/30/2024 per patient's request Palliative medicine team will continue to follow.   # Symptom management Per Primary team As needed Ativan  0.5 mg daily for agitation (per attending) Palliative medicine is available to assist as needed.    # Psychosocial support Continue to provide psycho-social and emotional support to patient and family.  # Discharge planning  To be determined.  # Prognosis  Prognosis is poor given patient's high disease burden and high risk for decompensation.    # Treatment plan discussed with:  Patient, nursing and medical team.   I personally spent a total of 35 minutes in the care of the patient today including preparing to see the patient, getting/reviewing separately obtained history, performing a medically appropriate exam/evaluation, counseling and educating, referring and communicating with other health care professionals, documenting clinical information in the EHR, independently interpreting results, and coordinating care.   Thank you for allowing us  to participate in the care of Debby ORN  [place]   ______________________________________________________________________________________ Kathlyne Bolder NP-C  Palliative Medicine Team Team Cell Phone: [phone] Please utilize secure chat with additional questions, if there is no response within 30 minutes please call the above phone number  Palliative Medicine Team providers are available by phone from 7am to 7pm daily and can be reached through the team cell phone.  Should this patient require assistance outside of these hours, please call the patient's attending physician.     "

## 2024-12-31 NOTE — Progress Notes (Signed)
 " PROGRESS NOTE    Zachary [name]  FMW:[id number] DOB: 1946/02/26 DOA: 12/25/2024 PCP: Shona Norleen PEDLAR, MD    Brief Narrative:  55 yoM with PMH as below significant for HTN, HLD, CKD 3b, bladder cancer s/p multiple surgeries and TURBT (12/2022), low-grade papillary urethral carcinoma, urinary retention requiring intermittent catheterizations 3-4x/ day, pre-DM, OSA, chronic pain, nephrolithiasis, anxiety, and depression.  Recent hospitalization for T9 vertebral fracture due to mechanical fall requiring decompression and fusion of thoracic spine discharged to rehab on 12/23.  Returns back to the hospital for altered mental status hypotension, tachycardia and hypoxia.  Admitted to the ICU for septic shock secondary to UTI.  Underwent cystoscopy on 12/29 with right-sided ureteral stent placement.  Weaned off pressors on 12/30.  Initial antibiotics IV vancomycin  and Rocephin  was switched to p.o. Augmentin .  Seen by neurosurgery due to recent spinal surgery on 12/17.  Hardware appears to be intact.   Assessment & Plan:  Septic shock secondary to urosepsis.  Pyonephrosis status post stent Leukocytosis - Urine cultures are growing E. coli, Klebsiella and Enterococcus faecalis.  Initially antibiotics were narrowed to Augmentin  but due to worsening leukocytosis they were broadened again to linezolid  and Zosyn .  CT abdomen pelvis and right upper quadrant ultrasound shows distended gallbladder, gallstones and some sludge but no evidence of acute cholecystitis.  LFTs are overall normal.  Monitor fever curve  Metabolic encephalopathy - Secondary to underlying infection.  Also has delirium.  Supportive measures  AKI on CKD 3B Hydronephrosis Urinary retention with chronic Foley catheter History of bladder cancer requiring previous TURBT -Continue Foley catheter.  Urology team is following - Baseline creatinine 2.0, peaked at 3.9 during this admission now improving  Essential hypertension - Slowly resume home  BP medications as he is able to tolerate.  IV as needed  Vertebral fracture, thoracic spine status post decompression/fusion Incisional drainage? - Recently performed on 12/17.  Routine pain control.  Seen by neurosurgery, Dr. Onetha.  Hardware appears to be intact  Hyperlipidemia - Pravastatin   Major depressive disorder Delirium - Cymbalta .  Seen by psychiatry team.  As needed Ativan  and bedtime Remeron  has been added. - B12, TSH, ammonia, folate are normal  TeleSitter ordered for patient's safety  PT/OT Palliative care team assisting with goals of care discussions.  DVT prophylaxis: heparin  injection 5,000 Units Start: 12/25/24 2200 SCDs Start: 12/25/24 1759      Code Status: Limited: Do not attempt resuscitation (DNR) -DNR-LIMITED -Do Not Intubate/DNI  Family Communication:   Status is: Inpatient Remains inpatient appropriate because: Continue antibiotics.   PT Follow up Recs: Skilled Nursing-Short Term Rehab (<3 Hours/Day)12/27/2024 1501  Subjective: Still quite weak.  Answering basic questions appropriately.  Asking me for cool water .  Examination:  General exam: Appears calm and comfortable  Respiratory system: Clear to auscultation. Respiratory effort normal. Cardiovascular system: S1 & S2 heard, RRR. No JVD, murmurs, rubs, gallops or clicks. No pedal edema. Gastrointestinal system: Abdomen is nondistended, soft and nontender. No organomegaly or masses felt. Normal bowel sounds heard. Central nervous system: Alert and oriented. No focal neurological deficits. Extremities: Symmetric 5 x 5 power. Skin: No rashes, lesions or ulcers.  Concerns about lower back incisional drainage Psychiatry: Poor memory Foley catheter is in place           Wound 12/25/24 1714 Pressure Injury Heel Left Deep Tissue Pressure Injury - Purple or maroon localized area of discolored intact skin or blood-filled blister due to damage of underlying soft tissue from pressure and/or shear.  (  Active)     Diet Orders (From admission, onward)     Start     Ordered   12/30/24 2149  Diet Heart Room service appropriate? Yes; Fluid consistency: Thin  Diet effective now       Question Answer Comment  Room service appropriate? Yes   Fluid consistency: Thin      12/30/24 2148            Objective: Vitals:   12/30/24 1955 12/30/24 2247 12/31/24 0418 12/31/24 0738  BP: (!) 160/84 137/78 (!) 152/80 (!) 143/82  Pulse: 82 89 86   Resp: 17 16 16    Temp: 97.7 F (36.5 C) 97.7 F (36.5 C) 97.7 F (36.5 C) 98.8 F (37.1 C)  TempSrc: Oral Oral Oral Oral  SpO2: 95% 96% 97%   Weight:   83.6 kg   Height:        Intake/Output Summary (Last 24 hours) at 12/31/2024 1017 Last data filed at 12/31/2024 0900 Gross per 24 hour  Intake 678.83 ml  Output 2425 ml  Net -1746.17 ml   Filed Weights   12/29/24 0343 12/30/24 0438 12/31/24 0418  Weight: 87.3 kg 86.8 kg 83.6 kg    Scheduled Meds:  carvedilol   6.25 mg Oral BID   Chlorhexidine  Gluconate Cloth  6 each Topical Daily   dorzolamide -timolol   1 drop Both Eyes BID   DULoxetine   60 mg Oral BID   heparin   5,000 Units Subcutaneous Q8H   latanoprost   1 drop Both Eyes QHS   lidocaine   1 patch Transdermal Q24H   lidocaine  (PF)  5 mL     linezolid   600 mg Oral Q12H   melatonin  3 mg Oral QHS   mirtazapine   7.5 mg Oral QHS   nystatin   5 mL Oral QID   polyethylene glycol  17 g Oral Daily   pravastatin   10 mg Oral QHS   Continuous Infusions:  piperacillin -tazobactam (ZOSYN )  IV 3.375 g (12/31/24 0514)    Nutritional status     Body mass index is 23.99 kg/m.  Data Reviewed:   CBC: Recent Labs  Lab 12/25/24 0922 12/26/24 0349 12/27/24 0354 12/28/24 0259 12/29/24 0159 12/30/24 1004 12/31/24 0133  WBC 12.6*   < > 28.9* 21.2* 16.4* 26.2* 30.3*  NEUTROABS 12.1*  --   --   --   --   --   --   HGB 12.8*   < > 10.4* 10.9* 11.2* 10.7* 11.7*  HCT 39.7   < > 31.5* 33.7* 35.0* 33.8* 37.2*  MCV 88.4   < > 85.8 86.4 89.1  90.1 91.4  PLT 392   < > 333 330 284 264 300   < > = values in this interval not displayed.   Basic Metabolic Panel: Recent Labs  Lab 12/26/24 0349 12/27/24 1127 12/28/24 0259 12/29/24 0159 12/30/24 0156 12/31/24 0133  NA 137 142 143 143 145 143  K 4.7 4.5 4.7 4.8 4.9 5.0  CL 103 108 109 111 113* 110  CO2 21* 20* 21* 22 22 23   GLUCOSE 140* 126* 98 98 95 96  BUN 87* 91* 88* 73* 55* 50*  CREATININE 3.76* 3.09* 2.83* 2.48* 2.21* 2.19*  CALCIUM 8.1* 8.3* 8.8* 8.4* 8.4* 8.5*  MG 2.0 2.2 2.1 1.9 1.8 1.9  PHOS 5.0* 3.9  --   --   --   --    GFR: Estimated Creatinine Clearance: 31.9 mL/min (A) (by C-G formula based on SCr of 2.19 mg/dL (H)). Liver  Function Tests: Recent Labs  Lab 12/25/24 0922 12/26/24 0349 12/30/24 1141  AST 30 23 22   ALT 18 15 22   ALKPHOS 257* 171* 147*  BILITOT 0.5 0.3 0.3  PROT 6.4* 5.9* 5.9*  ALBUMIN  2.8* 2.6* 2.6*   No results for input(s): LIPASE, AMYLASE in the last 168 hours. Recent Labs  Lab 12/30/24 1004  AMMONIA 29   Coagulation Profile: Recent Labs  Lab 12/25/24 0922 12/26/24 0349  INR 1.3* 1.3*   Cardiac Enzymes: No results for input(s): CKTOTAL, CKMB, CKMBINDEX, TROPONINI in the last 168 hours. BNP (last 3 results) Recent Labs    12/25/24 0922  PROBNP 2,105.0*   HbA1C: No results for input(s): HGBA1C in the last 72 hours. CBG: Recent Labs  Lab 12/30/24 1550 12/30/24 1950 12/31/24 0022 12/31/24 0415 12/31/24 0740  GLUCAP 93 91 104* 86 119*   Lipid Profile: No results for input(s): CHOL, HDL, LDLCALC, TRIG, CHOLHDL, LDLDIRECT in the last 72 hours. Thyroid  Function Tests: Recent Labs    12/30/24 1004  TSH 2.820   Anemia Panel: Recent Labs    12/30/24 1004  VITAMINB12 1,023*  FOLATE 8.9   Sepsis Labs: Recent Labs  Lab 12/25/24 9077 12/25/24 1115  LATICACIDVEN 2.7* 2.3*    Recent Results (from the past 240 hours)  Blood Culture (routine x 2)     Status: None   Collection Time:  12/25/24  9:22 AM   Specimen: BLOOD  Result Value Ref Range Status   Specimen Description BLOOD LEFT ARM  Final   Special Requests   Final    BOTTLES DRAWN AEROBIC AND ANAEROBIC Blood Culture adequate volume   Culture   Final    NO GROWTH 5 DAYS Performed at [facility], [address], [place], [place] [address]    Report Status 12/30/2024 FINAL  Final  Blood Culture (routine x 2)     Status: None   Collection Time: 12/25/24 10:08 AM   Specimen: BLOOD  Result Value Ref Range Status   Specimen Description BLOOD BLOOD RIGHT ARM  Final   Special Requests   Final    BOTTLES DRAWN AEROBIC AND ANAEROBIC Blood Culture adequate volume   Culture   Final    NO GROWTH 5 DAYS Performed at [facility], [address], [place], [place] [address]    Report Status 12/30/2024 FINAL  Final  Urine Culture     Status: Abnormal   Collection Time: 12/25/24  3:27 PM   Specimen: Urine, Catheterized  Result Value Ref Range Status   Specimen Description   Final    URINE, CATHETERIZED Performed at [facility], [address], [place], [place] [address]    Special Requests   Final    NONE Performed at [facility], [address], [place], [place] [address]    Culture (A)  Final    60,000 COLONIES/mL ESCHERICHIA COLI >=100,000 COLONIES/mL ENTEROCOCCUS FAECALIS    Report Status 12/27/2024 FINAL  Final   Organism ID, Bacteria ESCHERICHIA COLI (A)  Final   Organism ID, Bacteria ENTEROCOCCUS FAECALIS (A)  Final      Susceptibility   Escherichia coli - MIC*    AMPICILLIN  8 SENSITIVE Sensitive     CEFAZOLIN  (URINE) Value in next row Sensitive      2 SENSITIVEThis is a modified FDA-approved test that has been validated and its performance characteristics determined by the reporting laboratory.  This laboratory is certified under the Clinical Laboratory Improvement Amendments CLIA as qualified to perform high complexity clinical laboratory  testing.    CEFEPIME  Value in next row Sensitive      2  SENSITIVEThis is a modified FDA-approved test that has been validated and its performance characteristics determined by the reporting laboratory.  This laboratory is certified under the Clinical Laboratory Improvement Amendments CLIA as qualified to perform high complexity clinical laboratory testing.    ERTAPENEM Value in next row Sensitive      2 SENSITIVEThis is a modified FDA-approved test that has been validated and its performance characteristics determined by the reporting laboratory.  This laboratory is certified under the Clinical Laboratory Improvement Amendments CLIA as qualified to perform high complexity clinical laboratory testing.    CEFTRIAXONE  Value in next row Sensitive      2 SENSITIVEThis is a modified FDA-approved test that has been validated and its performance characteristics determined by the reporting laboratory.  This laboratory is certified under the Clinical Laboratory Improvement Amendments CLIA as qualified to perform high complexity clinical laboratory testing.    CIPROFLOXACIN  Value in next row Sensitive      2 SENSITIVEThis is a modified FDA-approved test that has been validated and its performance characteristics determined by the reporting laboratory.  This laboratory is certified under the Clinical Laboratory Improvement Amendments CLIA as qualified to perform high complexity clinical laboratory testing.    GENTAMICIN Value in next row Sensitive      2 SENSITIVEThis is a modified FDA-approved test that has been validated and its performance characteristics determined by the reporting laboratory.  This laboratory is certified under the Clinical Laboratory Improvement Amendments CLIA as qualified to perform high complexity clinical laboratory testing.    NITROFURANTOIN Value in next row Sensitive      2 SENSITIVEThis is a modified FDA-approved test that has been validated and its performance characteristics determined by the reporting laboratory.  This laboratory is  certified under the Clinical Laboratory Improvement Amendments CLIA as qualified to perform high complexity clinical laboratory testing.    TRIMETH/SULFA Value in next row Sensitive      2 SENSITIVEThis is a modified FDA-approved test that has been validated and its performance characteristics determined by the reporting laboratory.  This laboratory is certified under the Clinical Laboratory Improvement Amendments CLIA as qualified to perform high complexity clinical laboratory testing.    AMPICILLIN /SULBACTAM Value in next row Sensitive      2 SENSITIVEThis is a modified FDA-approved test that has been validated and its performance characteristics determined by the reporting laboratory.  This laboratory is certified under the Clinical Laboratory Improvement Amendments CLIA as qualified to perform high complexity clinical laboratory testing.    PIP/TAZO Value in next row Sensitive      <=4 SENSITIVEThis is a modified FDA-approved test that has been validated and its performance characteristics determined by the reporting laboratory.  This laboratory is certified under the Clinical Laboratory Improvement Amendments CLIA as qualified to perform high complexity clinical laboratory testing.    MEROPENEM Value in next row Sensitive      <=4 SENSITIVEThis is a modified FDA-approved test that has been validated and its performance characteristics determined by the reporting laboratory.  This laboratory is certified under the Clinical Laboratory Improvement Amendments CLIA as qualified to perform high complexity clinical laboratory testing.    * 60,000 COLONIES/mL ESCHERICHIA COLI   Enterococcus faecalis - MIC*    AMPICILLIN  Value in next row Sensitive      <=4 SENSITIVEThis is a modified FDA-approved test that has been validated and its performance characteristics determined by the reporting  laboratory.  This laboratory is certified under the Clinical Laboratory Improvement Amendments CLIA as qualified to  perform high complexity clinical laboratory testing.    NITROFURANTOIN Value in next row Sensitive      <=4 SENSITIVEThis is a modified FDA-approved test that has been validated and its performance characteristics determined by the reporting laboratory.  This laboratory is certified under the Clinical Laboratory Improvement Amendments CLIA as qualified to perform high complexity clinical laboratory testing.    VANCOMYCIN  Value in next row Sensitive      <=4 SENSITIVEThis is a modified FDA-approved test that has been validated and its performance characteristics determined by the reporting laboratory.  This laboratory is certified under the Clinical Laboratory Improvement Amendments CLIA as qualified to perform high complexity clinical laboratory testing.    * >=100,000 COLONIES/mL ENTEROCOCCUS FAECALIS  Urine Culture     Status: Abnormal   Collection Time: 12/25/24  9:01 PM   Specimen: Kidney, Right; Urine  Result Value Ref Range Status   Specimen Description URINE, RANDOM  Final   Special Requests RIGHT RENAL PELVIS SPEC A  Final   Culture (A)  Final    >=100,000 COLONIES/mL KLEBSIELLA PNEUMONIAE 30,000 COLONIES/mL ENTEROCOCCUS RAFFINOSUS Standardized susceptibility testing for this organism is not available. Performed at [facility], [address], [place], [place] [address]    Report Status 12/29/2024 FINAL  Final   Organism ID, Bacteria KLEBSIELLA PNEUMONIAE (A)  Final      Susceptibility   Klebsiella pneumoniae - MIC*    AMPICILLIN  RESISTANT Resistant     CEFAZOLIN  (URINE) Value in next row Sensitive      2 SENSITIVEThis is a modified FDA-approved test that has been validated and its performance characteristics determined by the reporting laboratory.  This laboratory is certified under the Clinical Laboratory Improvement Amendments CLIA as qualified to perform high complexity clinical laboratory testing.    CEFEPIME  Value in next row Sensitive      2 SENSITIVEThis is a modified  FDA-approved test that has been validated and its performance characteristics determined by the reporting laboratory.  This laboratory is certified under the Clinical Laboratory Improvement Amendments CLIA as qualified to perform high complexity clinical laboratory testing.    ERTAPENEM Value in next row Sensitive      2 SENSITIVEThis is a modified FDA-approved test that has been validated and its performance characteristics determined by the reporting laboratory.  This laboratory is certified under the Clinical Laboratory Improvement Amendments CLIA as qualified to perform high complexity clinical laboratory testing.    CEFTRIAXONE  Value in next row Sensitive      2 SENSITIVEThis is a modified FDA-approved test that has been validated and its performance characteristics determined by the reporting laboratory.  This laboratory is certified under the Clinical Laboratory Improvement Amendments CLIA as qualified to perform high complexity clinical laboratory testing.    CIPROFLOXACIN  Value in next row Sensitive      2 SENSITIVEThis is a modified FDA-approved test that has been validated and its performance characteristics determined by the reporting laboratory.  This laboratory is certified under the Clinical Laboratory Improvement Amendments CLIA as qualified to perform high complexity clinical laboratory testing.    GENTAMICIN Value in next row Sensitive      2 SENSITIVEThis is a modified FDA-approved test that has been validated and its performance characteristics determined by the reporting laboratory.  This laboratory is certified under the Clinical Laboratory Improvement Amendments CLIA as qualified to perform high complexity clinical laboratory testing.  NITROFURANTOIN Value in next row Intermediate      2 SENSITIVEThis is a modified FDA-approved test that has been validated and its performance characteristics determined by the reporting laboratory.  This laboratory is certified under the Clinical  Laboratory Improvement Amendments CLIA as qualified to perform high complexity clinical laboratory testing.    TRIMETH/SULFA Value in next row Sensitive      2 SENSITIVEThis is a modified FDA-approved test that has been validated and its performance characteristics determined by the reporting laboratory.  This laboratory is certified under the Clinical Laboratory Improvement Amendments CLIA as qualified to perform high complexity clinical laboratory testing.    AMPICILLIN /SULBACTAM Value in next row Sensitive      2 SENSITIVEThis is a modified FDA-approved test that has been validated and its performance characteristics determined by the reporting laboratory.  This laboratory is certified under the Clinical Laboratory Improvement Amendments CLIA as qualified to perform high complexity clinical laboratory testing.    PIP/TAZO Value in next row Sensitive      <=4 SENSITIVEThis is a modified FDA-approved test that has been validated and its performance characteristics determined by the reporting laboratory.  This laboratory is certified under the Clinical Laboratory Improvement Amendments CLIA as qualified to perform high complexity clinical laboratory testing.    MEROPENEM Value in next row Sensitive      <=4 SENSITIVEThis is a modified FDA-approved test that has been validated and its performance characteristics determined by the reporting laboratory.  This laboratory is certified under the Clinical Laboratory Improvement Amendments CLIA as qualified to perform high complexity clinical laboratory testing.    * >=100,000 COLONIES/mL KLEBSIELLA PNEUMONIAE         Radiology Studies: US  Abdomen Limited RUQ (LIVER/GB) Result Date: 12/30/2024 EXAM: Right Upper Quadrant Abdominal Ultrasound 12/30/2024 08:59:56 PM TECHNIQUE: Real-time ultrasonography of the right upper quadrant of the abdomen was performed. COMPARISON: CT abdomen and pelvis 12/30/2024. CLINICAL HISTORY: Gallbladder dilatation. FINDINGS:  LIVER: Normal echogenicity. No intrahepatic biliary ductal dilatation. No evidence of mass. Hepatopetal flow in the portal vein. BILIARY SYSTEM: Multiple stones and echogenic sludge within the gallbladder. No pericholecystic fluid or wall thickening. Murphy sign is negative. Bile ducts are not well visualized due to overlying bowel gas, but no ductal dilatation is identified. Unable to specifically measure the common bile duct. RIGHT KIDNEY: No hydronephrosis. No echogenic calculi. Incidental note of subcentimeter cysts on the right kidney. No imaging follow-up is indicated. PANCREAS: Visualized portions of the pancreas are unremarkable. OTHER: No right upper quadrant ascites. IMPRESSION: 1. Multiple stones and tumefactive sludge within the gallbladder without sonographic evidence of acute cholecystitis. 2. Bile ducts are not well visualized due to overlying bowel gas, but no ductal dilatation is identified; the common bile duct cannot be specifically measured. Electronically signed by: Elsie Gravely MD 12/30/2024 09:03 PM EST RP Workstation: [id number]   CT ABDOMEN PELVIS WO CONTRAST Result Date: 12/30/2024 CLINICAL DATA:  Sepsis, distended gallbladder on recent CT. EXAM: CT ABDOMEN AND PELVIS WITHOUT CONTRAST TECHNIQUE: Multidetector CT imaging of the abdomen and pelvis was performed following the standard protocol without IV contrast. RADIATION DOSE REDUCTION: This exam was performed according to the departmental dose-optimization program which includes automated exposure control, adjustment of the mA and/or kV according to patient size and/or use of iterative reconstruction technique. COMPARISON:  CT chest 12/25/2024. FINDINGS: Lower chest: Small bilateral pleural effusions with collapse/consolidation in both lower lobes. Atherosclerotic calcification of the aorta and right coronary artery. Heart is enlarged. No pericardial effusion. Distal esophagus is  grossly unremarkable. Hepatobiliary: Patient's arms  create streak artifact in the abdomen, degrading image quality. Liver is enlarged, 19.6 cm. Liver is otherwise grossly unremarkable. There may be dependent sludge or stones in the gallbladder versus streak artifact. No biliary ductal dilatation. Pancreas: Negative. Spleen: Negative. Adrenals/Urinary Tract: Slight nodular thickening of the body of the right adrenal gland. No specific follow-up necessary. Low-attenuation lesions in the right kidney. No specific follow-up necessary. Scarring in the kidneys bilaterally. Double-J right ureteral stent with the proximal loop formed in the right intrarenal collecting system and distal loop formed in the bladder. Previously seen 4 mm lower right ureteral stone appears to still be present within the lower right ureter (3/75). Punctate left renal stones. Ureters are decompressed. Foley catheter and air are seen in a thick-walled bladder. Stomach/Bowel: Stomach, small bowel, appendix and colon are unremarkable. Vascular/Lymphatic: Atherosclerotic calcification of the aorta. No pathologically enlarged lymph nodes. 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 Reproductive: Prostate is visualized. Other: Small bilateral inguinal hernias contain fat. No free fluid. Tiny umbilical hernia contains fat. Musculoskeletal: Hip screw and intramedullary rod in the proximal left femur. Osteopenia. Degenerative changes in the spine. At least partially fused sacroiliac joints. Partially imaged T8-T11 posterior fusion. L3-L5 posterior lumbar interbody fusion with interbody cages. IMPRESSION: 1. Small bilateral pleural effusions with bibasilar collapse/consolidation. Difficult to exclude pneumonia. 2. Sludge and/or stone debris in the bladder versus streak artifact from the patient's right arm. Consider right upper quadrant ultrasound, as clinically necessary. 3. Hepatomegaly. 4. Double-J right ureteral stent in place. Previously seen 4 mm  stone appears to still be within the lower right ureter (3/75). No hydronephrosis. 5. Punctate left renal stones. 6. Aortic atherosclerosis (ICD10-I70.0). Coronary artery calcification. 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 Electronically Signed   By: Newell Eke M.D.   On: 12/30/2024 15:44   DG Chest Port 1 View Result Date: 12/30/2024 CLINICAL DATA:  Dyspnea EXAM: PORTABLE CHEST 1 VIEW COMPARISON:  Four days ago FINDINGS: Stable cardiomediastinal silhouette. Right lung is clear. Minimal left basilar subsegmental atelectasis or scarring is noted. Status post surgical posterior fusion of lower thoracic spine. IMPRESSION: Minimal left basilar subsegmental atelectasis or scarring. Electronically Signed   By: Lynwood Landy Raddle M.D.   On: 12/30/2024 14:16           LOS: 6 days   Time spent= 35 mins    Burgess JAYSON Dare, MD [facility]  If 7PM-7AM, please contact night-coverage  12/31/2024, 10:17 AM  "

## 2024-12-31 NOTE — Progress Notes (Signed)
" °   12/31/24 1946  BiPAP/CPAP/SIPAP  $ Non-Invasive Home Ventilator  Initial  BiPAP/CPAP/SIPAP Pt Type Adult  BiPAP/CPAP/SIPAP Resmed  Mask Type Full face mask  Patient Home Machine Yes   Placed pt on home cpap   "

## 2025-01-01 ENCOUNTER — Inpatient Hospital Stay (HOSPITAL_COMMUNITY)

## 2025-01-01 DIAGNOSIS — R6521 Severe sepsis with septic shock: Secondary | ICD-10-CM | POA: Diagnosis not present

## 2025-01-01 DIAGNOSIS — Z7189 Other specified counseling: Secondary | ICD-10-CM | POA: Diagnosis not present

## 2025-01-01 DIAGNOSIS — A419 Sepsis, unspecified organism: Secondary | ICD-10-CM | POA: Diagnosis not present

## 2025-01-01 DIAGNOSIS — N390 Urinary tract infection, site not specified: Secondary | ICD-10-CM | POA: Diagnosis not present

## 2025-01-01 DIAGNOSIS — Z515 Encounter for palliative care: Secondary | ICD-10-CM | POA: Diagnosis not present

## 2025-01-01 LAB — CBC
HCT: 34.5 % — ABNORMAL LOW (ref 39.0–52.0)
Hemoglobin: 10.8 g/dL — ABNORMAL LOW (ref 13.0–17.0)
MCH: 27.9 pg (ref 26.0–34.0)
MCHC: 31.3 g/dL (ref 30.0–36.0)
MCV: 89.1 fL (ref 80.0–100.0)
Platelets: 315 K/uL (ref 150–400)
RBC: 3.87 MIL/uL — ABNORMAL LOW (ref 4.22–5.81)
RDW: 14.7 % (ref 11.5–15.5)
WBC: 32.3 K/uL — ABNORMAL HIGH (ref 4.0–10.5)
nRBC: 0 % (ref 0.0–0.2)

## 2025-01-01 LAB — BASIC METABOLIC PANEL WITH GFR
Anion gap: 9 (ref 5–15)
BUN: 49 mg/dL — ABNORMAL HIGH (ref 8–23)
CO2: 24 mmol/L (ref 22–32)
Calcium: 8.3 mg/dL — ABNORMAL LOW (ref 8.9–10.3)
Chloride: 109 mmol/L (ref 98–111)
Creatinine, Ser: 2.45 mg/dL — ABNORMAL HIGH (ref 0.61–1.24)
GFR, Estimated: 26 mL/min — ABNORMAL LOW
Glucose, Bld: 115 mg/dL — ABNORMAL HIGH (ref 70–99)
Potassium: 4.6 mmol/L (ref 3.5–5.1)
Sodium: 141 mmol/L (ref 135–145)

## 2025-01-01 LAB — GLUCOSE, CAPILLARY
Glucose-Capillary: 104 mg/dL — ABNORMAL HIGH (ref 70–99)
Glucose-Capillary: 91 mg/dL (ref 70–99)
Glucose-Capillary: 98 mg/dL (ref 70–99)
Glucose-Capillary: 98 mg/dL (ref 70–99)
Glucose-Capillary: 122 mg/dL — ABNORMAL HIGH (ref 70–99)

## 2025-01-01 LAB — MAGNESIUM: Magnesium: 1.9 mg/dL (ref 1.7–2.4)

## 2025-01-01 MED ORDER — ADULT MULTIVITAMIN W/MINERALS CH
1.0000 | ORAL_TABLET | Freq: Every day | ORAL | Status: DC
  Administered 2025-01-01 – 2025-01-09 (×9): 1 via ORAL
  Filled 2025-01-01 (×10): qty 1

## 2025-01-01 MED ORDER — ENSURE PLUS HIGH PROTEIN PO LIQD
237.0000 mL | Freq: Three times a day (TID) | ORAL | Status: DC
  Administered 2025-01-01 – 2025-01-09 (×12): 237 mL via ORAL

## 2025-01-01 NOTE — Progress Notes (Signed)
 "  Palliative Medicine Inpatient Follow Up Note   HPI:  79 y.o. male  with past medical history significant of hypertension, SLE, CKD stage IIIb, bladder cancer status post multiple surgeries and TURBT (12/2022), low-grade papillary urethral carcinoma, urinary retention requiring intermittent catheterization 3-4 times a day, prediabetes, nephrolithiasis, anxiety, chronic, depression, and arthritis admitted on 12/25/2024 from rehab [facility]) with altered mental status, hypotension, tachycardia, and hypoxia.  At baseline, patient is ambulatory and conversant.  Wife reported worsening mentation for 3 days.     Recent hospitalization with T9 vertebral fracture after mechanical fall status post posterior decompression and fusion from T7-T11 on 12/13/2024 with Dr. Onetha.   PMT has been consulted to assist with goals of care conversation. Patient/Family face treatment option decisions, advanced directive decisions and anticipatory care needs.    This is a 7-day and 30-day readmission.  Noted for 2 inpatient admissions in the last 6 months.  He was recently admitted from 12/13 to 12/19/2024 due to T9 vertebral fracture.  This is hospital day #: 7 Today's Discussion 01/01/2025  I have reviewed medical records including:  EPIC notes: Reviewed hospitalist progress note from 01/01/2025 detailing treatment plan mainly for septic shock secondary to urosepsis, pyelonephrosis status post stent with ongoing leukocytosis.  Patient also sustained vertebral fracture, status post decompression/fusion concerning for incisional drainage.  MRI of the thoracic spine ordered to rule out infection.  Reviewed wound nurse consult note from 01/01/2025 noted for previous incision site beginning to dehisce with yellow wound bed and small amount of yellow drainage.  Recommended consult to neurosurgical team for further evaluation.  Reviewed to track clinical course and prognostication.  Also reviewed most recent notes from speech,  neurologist, and dietitian. MAR: Reviewed PRN meds received over the last 24 hours.  Patient received as needed Ativan  0.5 mg x 2 doses.  He also received Robaxin  500 mg today at 0 953 reviewed to assess needs for medication adjustment to optimize comfort.  Available advanced directives in ACP: Currently has AD documents uploaded on file. Labs: Lab results from 01/01/2025 reviewed: Ongoing leukocytosis WBC at 32.3.  Hemoglobin low but stable at 10.8, with hematocrit of 34.5.  Creatinine elevated at 2.45, reviewed to assist with prescribing and prognostication  Created space and opportunity for patient to explore thoughts feelings and fears regarding current medical situation.  Visited patient at bedside. Family member not present present. Patient observed sleeping comfortably, breathing spontaneously and evenly. No signs or gestures indicating pain or distress. Patient left undisturbed to promote and optimize comfort and rest.  Coordinated plan of care with bedside RN.  RN reports that patient has recently returned from MRI of the spine to rule out spine infection.  No significant events over the last 24 hours.  Patient remains intermittently confused.  Shortly after, I did attempt to reach wife, [name] telephonically, to provide medical update and to provide reassurance that PMT is available for an added layer of support.  [name] did not answer the phone call, I left a brief message requesting for a return call.  Goals of care unchanged.  Maintain DNR limited.  Continue current medical interventions, allow time for outcomes and hopes for clinical improvement.  Per prior conversation with wife, if patient's medical condition worsens, she would likely pursue comfort focused pathway.  Patient and her family face treatment option decisions, advanced directive decisions and anticipatory care needs.   Discussed the importance of continued conversation with family and their  medical providers regarding  overall plan  of care and treatment options, ensuring decisions are within the context of the patients values and GOCs. The patient, family, and medical team were encouraged to revisit these discussions regularly, as preferences and circumstances may change over time.  Questions and concerns addressed   Palliative Support Provided.   Objective Assessment: Vital Signs Vitals:   01/01/25 0429 01/01/25 0751  BP: 133/67 (!) 143/74  Pulse: 78 86  Resp: 19 15  Temp: 98.6 F (37 C) 98.2 F (36.8 C)  SpO2: 100%     Intake/Output Summary (Last 24 hours) at 01/01/2025 0952 Last data filed at 01/01/2025 9487 Gross per 24 hour  Intake 990 ml  Output 1025 ml  Net -35 ml   Last Weight  Most recent update: 01/01/2025  4:33 AM    Weight  83.7 kg (184 lb 8.4 oz)             Physical Exam Constitutional:      Appearance: He is ill-appearing.  HENT:     Head: Normocephalic and atraumatic.     Mouth/Throat:     Mouth: Mucous membranes are moist.  Pulmonary:     Effort: Pulmonary effort is normal.  Abdominal:     General: Abdomen is flat. Bowel sounds are normal.     Palpations: Abdomen is soft.  Musculoskeletal:     Comments: Generalized weakness  Skin:    General: Skin is warm.  Neurological:     Mental Status: He is alert. Mental status is at baseline.  Psychiatric:        Mood and Affect: Mood normal.     SUMMARY OF RECOMMENDATIONS    # Complex medical decision-making/goals of care  Code Status: Maintain DNR/DNI status Goal of care is medical stabilization and recovery to the extent this is possible Ongoing leukocytosis, WBC today is 32.3.  MRI spine ordered to rule out spine infection.  Wound nurse consulted, noted for possible surgical site infection, recommends neurosurgical team consult for further plan of care. Per patient's wife, if patient condition further declines, open to transition to comfort focused care Delirium precautions Palliative medicine team will  continue to follow to track clinical course.  # Symptom management Per Primary team As needed Ativan  0.5 mg for agitation (per attending) Palliative medicine is available to assist as needed.    # Psychosocial support Continue to provide psycho-social and emotional support to patient and family.  # Discharge planning  To be determined.  # Prognosis  Prognosis is poor given patient's high disease burden and high risk for decompensation.    # Treatment plan discussed with: Bedside RN, and medical team.  I personally spent a total of 35 minutes in the care of the patient today including preparing to see the patient, getting/reviewing separately obtained history, performing a medically appropriate exam/evaluation, referring and communicating with other health care professionals, documenting clinical information in the EHR, independently interpreting results, and coordinating care.   Thank you for allowing us  to participate in the care of Zachary [name]   ______________________________________________________________________________________ Kathlyne Bolder NP-C [place] Palliative Medicine Team Team Cell Phone: [phone] Please utilize secure chat with additional questions, if there is no response within 30 minutes please call the above phone number  Palliative Medicine Team providers are available by phone from 7am to 7pm daily and can be reached through the team cell phone.  Should this patient require assistance outside of these hours, please call the patient's attending physician.    Chart reviewed.  Patient  which the physical assessment greater than.   "

## 2025-01-01 NOTE — Progress Notes (Signed)
 Speech Language Pathology Treatment: Dysphagia  Patient Details Name: Zachary [name] MRN: [id number] DOB: 1946-07-23 Today's Date: 01/01/2025 Time: 8848-8782 SLP Time Calculation (min) (ACUTE ONLY): 26 min  Assessment / Plan / Recommendation Clinical Impression  PLAN: Continue current heart healthy diet as ordered per MD; thin liquids; meds whole with puree or water . Mr. Skoda is protecting his airway; chewing is slow but thorough.  No further concerns for dysphagia.  Primary obstacle to intake is his poor appetite and fluctuating mental status. He will need 1:1 support for feeding.    Assisted pt with lunch. He ate several bites of meatloaf and mac and cheese, then politely declined further POs. He was able to feed himself after food was cut, but tired easily so SLP fed him.  Recommend he continue current diet to allow more choices from menu.  Will need 1:1 support during meals.  No further SLP intervention is needed. D/W RN.   HPI HPI: Bailen Geffre is a 79 yo M who was admitted from [facility] with AMS, hypotension, tachycardia, and hypoxia. Found to have septic shock 2/2 ureteral stone now s/p cystoscopy and stent placement. Chest CT 12/29 with Small bilateral pleural effusions. Bibasilar atelectasis. No focal consolidation or pulmonary edema. No pneumothorax. Head CT 12/29 with no acute findings. PMH significant for HTN, HLD, CKD 3b, bladder cancer s/p multiple surgeries and TURBT (12/2022), low-grade papillary urethral carcinoma, urinary retention requiring intermittent catheterizations 3-4x/ day, pre-DM, OSA, chronic pain, nephrolithiasis, anxiety, and depression. Recent hospitalization with T9 vertebral fracture after mechanical fall s/p posterior decompression and fusion from T7-T11 on 12/17 with Dr. Onetha discharged to [facility] rehab 12/23.  BSE completed with recommendation for Regular/nectar-thickened liquids.  ST f/u for diet tolerance/advancement.      SLP Plan  Continue  with current plan of care        Swallow Evaluation Recommendations   Recommendations: PO diet PO Diet Recommendation: Regular;Thin liquids (Level 0) Liquid Administration via: Cup;Straw Medication Administration: Whole meds with puree Supervision: Staff to assist with self-feeding;Patient able to self-feed Postural changes:  (raise HOB as much as pt will allow) Oral care recommendations: Oral care BID (2x/day)     Recommendations                     Oral care BID;Staff/trained caregiver to provide oral care     Dysphagia, oropharyngeal phase (R13.12)     Continue with current plan of care    Ulysees Robarts L. Vona, MA CCC/SLP Clinical Specialist - Acute Care SLP Acute Rehabilitation Services Office number [phone]  Vona Palma Laurice  01/01/2025, 12:23 PM

## 2025-01-01 NOTE — Progress Notes (Signed)
 Initial Nutrition Assessment  DOCUMENTATION CODES:  Severe malnutrition in context of acute illness/injury  INTERVENTION:  Ensure Plus High Protein po TID, each supplement provides 350 kcal and 20 grams of protein. Liberalize diet to regular. Magic cup TID with meals, each supplement provides 290 kcal and 9 grams of protein. Nursing to assist with self-feeding. MVI with minerals daily.  NUTRITION DIAGNOSIS:  Severe Malnutrition related to acute illness (back surgery 12/13/24) as evidenced by moderate muscle depletion, percent weight loss (11% weight loss within 1 month).  GOAL:  Patient will meet greater than or equal to 90% of their needs  MONITOR:  PO intake, Supplement acceptance, Skin  REASON FOR ASSESSMENT:  Other (Comment) (request from SLP d/t patient with poor appetite/intake)    ASSESSMENT:  79 yo male admitted with AMS. PMH includes spondylolisthesis, arthritis, cluster headaches, OSA, bladder cancer, HTN, prediabetes, blood dyscrasia, Parkinsonism, hearing loss, recent back surgery 12/13/24 for T9 vertebral fx.  Patient confused during RD visit, asking to make sure he get on the correct plane so he can get home. He remembers having back surgery in December and says he's been eating poorly since then, but unsure why. He says that he likes Ensure. He does not like the food.    Currently on a heart healthy diet, consuming 0-25% of meals over the past 2 days. SLP helped him with lunch today and he took several bites before declining further POs. He is no longer having difficulty chewing or swallowing, but requires 1:1 support during meals.   Patient has a wound on his back from recent back surgery. WOC RN evaluated wound and Neurosurgery is supposed to assess today as well.   Weight history reviewed.  12/13/24: 94.3 kg (day of back surgery) 12/20/24: 91.2 kg 01/01/25: 83.7 kg 11% weight loss within 1 month is severe.  Patient meets criteria for severe malnutrition,  given moderate depletion of muscle mass with 11% weight loss within the past month.  Labs reviewed.  Folate 8.9 WNL Vitamin B12 1,023 (H) CBG: 110-121-108-104-91-98  Medications reviewed and include remeron , miralax .  NUTRITION - FOCUSED PHYSICAL EXAM: Flowsheet Row Most Recent Value  Orbital Region Mild depletion  Upper Arm Region No depletion  Thoracic and Lumbar Region No depletion  Buccal Region Mild depletion  Temple Region Mild depletion  Clavicle Bone Region Moderate depletion  Clavicle and Acromion Bone Region Moderate depletion  Scapular Bone Region Moderate depletion  Dorsal Hand No depletion  Patellar Region Moderate depletion  Anterior Thigh Region Moderate depletion  Posterior Calf Region Moderate depletion  Edema (RD Assessment) None  Hair Reviewed  Eyes Reviewed  Mouth Reviewed  Skin Reviewed  Nails Reviewed    Diet Order:   Diet Order             Diet Heart Room service appropriate? Yes; Fluid consistency: Thin  Diet effective now                   EDUCATION NEEDS:  Not appropriate for education at this time  Skin:  Skin Assessment: Skin Integrity Issues: Skin Integrity Issues:: DTI DTI: L heel  Last BM:  1/5 type 7  Height:  Ht Readings from Last 1 Encounters:  12/29/24 6' 1.5 (1.867 m)   Weight:  Wt Readings from Last 1 Encounters:  01/01/25 83.7 kg   Ideal Body Weight:  85 kg  BMI:  Body mass index is 24.02 kg/m.  Estimated Nutritional Needs:  Kcal:  2150-2350 Protein:  110-125 gm Fluid:  2-2.3 L   Suzen HUNT RD, LDN, CNSC Contact via secure chat. If unavailable, use group chat RD Inpatient.

## 2025-01-01 NOTE — Progress Notes (Signed)
 Purulent discharge noted from pts back wound and MD notified and follow dress change orders

## 2025-01-01 NOTE — Plan of Care (Signed)
  Problem: Clinical Measurements: Goal: Ability to maintain clinical measurements within normal limits will improve Outcome: Progressing Goal: Will remain free from infection Outcome: Progressing Goal: Diagnostic test results will improve Outcome: Progressing Goal: Respiratory complications will improve Outcome: Progressing Goal: Cardiovascular complication will be avoided Outcome: Progressing   Problem: Nutrition: Goal: Adequate nutrition will be maintained Outcome: Progressing   Problem: Elimination: Goal: Will not experience complications related to bowel motility Outcome: Progressing Goal: Will not experience complications related to urinary retention Outcome: Progressing   Problem: Pain Managment: Goal: General experience of comfort will improve and/or be controlled Outcome: Progressing   Problem: Safety: Goal: Ability to remain free from injury will improve Outcome: Progressing   Problem: Skin Integrity: Goal: Risk for impaired skin integrity will decrease Outcome: Progressing

## 2025-01-01 NOTE — Progress Notes (Signed)
 7 Days Post-Op  Subjective: Patient is slowly responsive.  Reports no specific LUTS.  Wishes to follow-up with [facility].  ROS:  Review of Systems  All other systems reviewed and are negative.   Anti-infectives: Anti-infectives (From admission, onward)    Start     Dose/Rate Route Frequency Ordered Stop   12/30/24 1230  linezolid  (ZYVOX ) tablet 600 mg        600 mg Oral Every 12 hours 12/30/24 1137     12/30/24 1230  piperacillin -tazobactam (ZOSYN ) IVPB 3.375 g        3.375 g 12.5 mL/hr over 240 Minutes Intravenous Every 8 hours 12/30/24 1137     12/27/24 2200  amoxicillin -clavulanate (AUGMENTIN ) 500-125 MG per tablet 1 tablet  Status:  Discontinued        1 tablet Oral 2 times daily 12/27/24 1410 12/30/24 1137   12/27/24 1145  amoxicillin -clavulanate (AUGMENTIN ) 875-125 MG per tablet 1 tablet  Status:  Discontinued        1 tablet Oral Every 12 hours 12/27/24 1049 12/27/24 1410   12/27/24 1030  ampicillin  (OMNIPEN) 2 g in sodium chloride  0.9 % 100 mL IVPB  Status:  Discontinued        2 g 300 mL/hr over 20 Minutes Intravenous Every 8 hours 12/27/24 1020 12/27/24 1049   12/26/24 0800  cefTRIAXone  (ROCEPHIN ) 2 g in sodium chloride  0.9 % 100 mL IVPB  Status:  Discontinued        2 g 200 mL/hr over 30 Minutes Intravenous Every 24 hours 12/25/24 1805 12/27/24 1049   12/25/24 0945  ceFEPIme  (MAXIPIME ) 2 g in sodium chloride  0.9 % 100 mL IVPB        2 g 200 mL/hr over 30 Minutes Intravenous  Once 12/25/24 0935 12/25/24 1116   12/25/24 0945  metroNIDAZOLE  (FLAGYL ) IVPB 500 mg        500 mg 100 mL/hr over 60 Minutes Intravenous  Once 12/25/24 0935 12/25/24 1220   12/25/24 0945  vancomycin  (VANCOCIN ) IVPB 1000 mg/200 mL premix  Status:  Discontinued        1,000 mg 200 mL/hr over 60 Minutes Intravenous  Once 12/25/24 0935 12/25/24 0941   12/25/24 0945  vancomycin  (VANCOREADY) IVPB 2000 mg/400 mL        2,000 mg 200 mL/hr over 120 Minutes Intravenous  Once 12/25/24 0941 12/25/24  1200       Current Facility-Administered Medications  Medication Dose Route Frequency Provider Last Rate Last Admin   acetaminophen  (TYLENOL ) tablet 650 mg  650 mg Oral Q6H PRN Wilson, Tara N, PA-C   650 mg at 12/30/24 0456   bisacodyl  (DULCOLAX) suppository 10 mg  10 mg Rectal Daily PRN Simpson, Paula B, NP       carvedilol  (COREG ) tablet 6.25 mg  6.25 mg Oral BID Wilson, Tara N, PA-C   6.25 mg at 01/01/25 9046   Chlorhexidine  Gluconate Cloth 2 % PADS 6 each  6 each Topical Daily Antonetta Moccasin B, NP   6 each at 01/01/25 9043   dorzolamide -timolol  (COSOPT ) 2-0.5 % ophthalmic solution 1 drop  1 drop Both Eyes BID Wilson, Tara N, PA-C   1 drop at 01/01/25 0957   DULoxetine  (CYMBALTA ) DR capsule 60 mg  60 mg Oral BID Wilson, Tara N, PA-C   60 mg at 01/01/25 9046   glucagon  (human recombinant) (GLUCAGEN) injection 1 mg  1 mg Intravenous PRN Amin, Ankit C, MD       guaiFENesin  (ROBITUSSIN) 100 MG/5ML liquid  5 mL  5 mL Oral Q4H PRN Amin, Ankit C, MD   5 mL at 12/31/24 1024   heparin  injection 5,000 Units  5,000 Units Subcutaneous Q8H Simpson, Paula B, NP   5,000 Units at 01/01/25 0640   hydrALAZINE  (APRESOLINE ) injection 10 mg  10 mg Intravenous Q4H PRN Amin, Ankit C, MD       ipratropium-albuterol  (DUONEB) 0.5-2.5 (3) MG/3ML nebulizer solution 3 mL  3 mL Nebulization Q4H PRN Amin, Ankit C, MD       latanoprost  (XALATAN ) 0.005 % ophthalmic solution 1 drop  1 drop Both Eyes QHS Wilson, Tara N, PA-C   1 drop at 12/31/24 2153   lidocaine  (LIDODERM ) 5 % 1 patch  1 patch Transdermal Q24H Simpson, Paula B, NP   1 patch at 12/31/24 2143   linezolid  (ZYVOX ) tablet 600 mg  600 mg Oral Q12H Amin, Ankit C, MD   600 mg at 01/01/25 9046   LORazepam  (ATIVAN ) injection 0.5 mg  0.5 mg Intravenous Daily PRN Jacquetta Sharlot GRADE, NP   0.5 mg at 01/01/25 9183   melatonin tablet 3 mg  3 mg Oral QHS Wilson, Tara N, PA-C   3 mg at 12/31/24 2153   methocarbamol  (ROBAXIN ) injection 500 mg  500 mg Intravenous Q6H PRN Simpson,  Paula B, NP   500 mg at 01/01/25 0953   metoprolol  tartrate (LOPRESSOR ) injection 5 mg  5 mg Intravenous Q4H PRN Amin, Ankit C, MD       mirtazapine  (REMERON ) tablet 7.5 mg  7.5 mg Oral QHS Jacquetta Sharlot GRADE, NP   7.5 mg at 12/31/24 2140   nystatin  (MYCOSTATIN ) 100000 UNIT/ML suspension 500,000 Units  5 mL Oral QID Amin, Ankit C, MD   500,000 Units at 01/01/25 9047   ondansetron  (ZOFRAN ) injection 4 mg  4 mg Intravenous Q6H PRN Amin, Ankit C, MD   4 mg at 12/31/24 1029   oxyCODONE  (Oxy IR/ROXICODONE ) immediate release tablet 5 mg  5 mg Oral Q6H PRN Tanda Rexene SAILOR, PA-C   5 mg at 12/31/24 1025   piperacillin -tazobactam (ZOSYN ) IVPB 3.375 g  3.375 g Intravenous Q8H Amin, Ankit C, MD 12.5 mL/hr at 01/01/25 0640 3.375 g at 01/01/25 0640   polyethylene glycol (MIRALAX  / GLYCOLAX ) packet 17 g  17 g Oral Daily Antonetta Moccasin B, NP   17 g at 12/31/24 1024   pravastatin  (PRAVACHOL ) tablet 10 mg  10 mg Oral QHS Wilson, Tara N, PA-C   10 mg at 12/31/24 2140   traZODone  (DESYREL ) tablet 25 mg  25 mg Oral QHS PRN Wilson, Tara N, PA-C   25 mg at 12/31/24 2140     Objective: Vital signs in last 24 hours: Temp:  [97.9 F (36.6 C)-98.6 F (37 C)] 98.1 F (36.7 C) (01/05 1206) Pulse Rate:  [75-86] 85 (01/05 1206) Resp:  [15-22] 22 (01/05 1206) BP: (120-143)/(67-75) 141/75 (01/05 1206) SpO2:  [98 %-100 %] 100 % (01/05 0429) FiO2 (%):  [21 %] 21 % (01/04 1947) Weight:  [83.7 kg] 83.7 kg (01/05 0429)  Intake/Output from previous day: 01/04 0701 - 01/05 0700 In: 1110 [P.O.:960; IV Piggyback:150] Out: 1025 [Urine:1025] Intake/Output this shift: Total I/O In: -  Out: 1200 [Urine:1200]   Physical Exam Vitals reviewed.  Constitutional:      Appearance: Normal appearance.  Cardiovascular:     Rate and Rhythm: Normal rate and regular rhythm.  Pulmonary:     Effort: Pulmonary effort is normal. No respiratory distress.  Genitourinary:  Comments: Urine remains turbid.  Neurological:     Mental  Status: He is alert.     Lab Results:  Recent Labs    12/31/24 0133 01/01/25 0138  WBC 30.3* 32.3*  HGB 11.7* 10.8*  HCT 37.2* 34.5*  PLT 300 315   BMET Recent Labs    12/31/24 0133 01/01/25 0138  NA 143 141  K 5.0 4.6  CL 110 109  CO2 23 24  GLUCOSE 96 115*  BUN 50* 49*  CREATININE 2.19* 2.45*  CALCIUM 8.5* 8.3*   PT/INR No results for input(s): LABPROT, INR in the last 72 hours.  ABG No results for input(s): PHART, HCO3 in the last 72 hours.  Invalid input(s): PCO2, PO2   Studies/Results: US  Abdomen Limited RUQ (LIVER/GB) Result Date: 12/30/2024 EXAM: Right Upper Quadrant Abdominal Ultrasound 12/30/2024 08:59:56 PM TECHNIQUE: Real-time ultrasonography of the right upper quadrant of the abdomen was performed. COMPARISON: CT abdomen and pelvis 12/30/2024. CLINICAL HISTORY: Gallbladder dilatation. FINDINGS: LIVER: Normal echogenicity. No intrahepatic biliary ductal dilatation. No evidence of mass. Hepatopetal flow in the portal vein. BILIARY SYSTEM: Multiple stones and echogenic sludge within the gallbladder. No pericholecystic fluid or wall thickening. Murphy sign is negative. Bile ducts are not well visualized due to overlying bowel gas, but no ductal dilatation is identified. Unable to specifically measure the common bile duct. RIGHT KIDNEY: No hydronephrosis. No echogenic calculi. Incidental note of subcentimeter cysts on the right kidney. No imaging follow-up is indicated. PANCREAS: Visualized portions of the pancreas are unremarkable. OTHER: No right upper quadrant ascites. IMPRESSION: 1. Multiple stones and tumefactive sludge within the gallbladder without sonographic evidence of acute cholecystitis. 2. Bile ducts are not well visualized due to overlying bowel gas, but no ductal dilatation is identified; the common bile duct cannot be specifically measured. Electronically signed by: Elsie Gravely MD 12/30/2024 09:03 PM EST RP Workstation: [id number]   CT  ABDOMEN PELVIS WO CONTRAST Result Date: 12/30/2024 CLINICAL DATA:  Sepsis, distended gallbladder on recent CT. EXAM: CT ABDOMEN AND PELVIS WITHOUT CONTRAST TECHNIQUE: Multidetector CT imaging of the abdomen and pelvis was performed following the standard protocol without IV contrast. RADIATION DOSE REDUCTION: This exam was performed according to the departmental dose-optimization program which includes automated exposure control, adjustment of the mA and/or kV according to patient size and/or use of iterative reconstruction technique. COMPARISON:  CT chest 12/25/2024. FINDINGS: Lower chest: Small bilateral pleural effusions with collapse/consolidation in both lower lobes. Atherosclerotic calcification of the aorta and right coronary artery. Heart is enlarged. No pericardial effusion. Distal esophagus is grossly unremarkable. Hepatobiliary: Patient's arms create streak artifact in the abdomen, degrading image quality. Liver is enlarged, 19.6 cm. Liver is otherwise grossly unremarkable. There may be dependent sludge or stones in the gallbladder versus streak artifact. No biliary ductal dilatation. Pancreas: Negative. Spleen: Negative. Adrenals/Urinary Tract: Slight nodular thickening of the body of the right adrenal gland. No specific follow-up necessary. Low-attenuation lesions in the right kidney. No specific follow-up necessary. Scarring in the kidneys bilaterally. Double-J right ureteral stent with the proximal loop formed in the right intrarenal collecting system and distal loop formed in the bladder. Previously seen 4 mm lower right ureteral stone appears to still be present within the lower right ureter (3/75). Punctate left renal stones. Ureters are decompressed. Foley catheter and air are seen in a thick-walled bladder. Stomach/Bowel: Stomach, small bowel, appendix and colon are unremarkable. Vascular/Lymphatic: Atherosclerotic calcification of the aorta. No pathologically enlarged lymph nodes. 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  Right internal iliac artery aneurysm, 2.3 cm. Reproductive: Prostate is visualized. Other: Small bilateral inguinal hernias contain fat. No free fluid. Tiny umbilical hernia contains fat. Musculoskeletal: Hip screw and intramedullary rod in the proximal left femur. Osteopenia. Degenerative changes in the spine. At least partially fused sacroiliac joints. Partially imaged T8-T11 posterior fusion. L3-L5 posterior lumbar interbody fusion with interbody cages. IMPRESSION: 1. Small bilateral pleural effusions with bibasilar collapse/consolidation. Difficult to exclude pneumonia. 2. Sludge and/or stone debris in the bladder versus streak artifact from the patient's right arm. Consider right upper quadrant ultrasound, as clinically necessary. 3. Hepatomegaly. 4. Double-J right ureteral stent in place. Previously seen 4 mm stone appears to still be within the lower right ureter (3/75). No hydronephrosis. 5. Punctate left renal stones. 6. Aortic atherosclerosis (ICD10-I70.0). Coronary artery calcification. 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 Electronically Signed   By: Newell Eke M.D.   On: 12/30/2024 15:44   DG Chest Port 1 View Result Date: 12/30/2024 CLINICAL DATA:  Dyspnea EXAM: PORTABLE CHEST 1 VIEW COMPARISON:  Four days ago FINDINGS: Stable cardiomediastinal silhouette. Right lung is clear. Minimal left basilar subsegmental atelectasis or scarring is noted. Status post surgical posterior fusion of lower thoracic spine. IMPRESSION: Minimal left basilar subsegmental atelectasis or scarring. Electronically Signed   By: Lynwood Landy Raddle M.D.   On: 12/30/2024 14:16     Assessment and Plan: Right ureteral stone with pyonephrosis and sepsis with AKI improving on ABX s/p stenting.  He will need ureteroscopy in 2-3 weeks.  His urologic care has been managed at [facility] over the past year .  Patient states that he would like to follow-up with [facility].   Free to call [facility] at any point should he need assistance for follow-up appointment.  2.   History of bladder cancer.   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    3.  Chronic foley catheter for retention.   No need for voiding trial this admission.   4.  Urology will sign off at this time.  Please feel free to call with questions, concerns, or acute urologic changes.      LOS: 7 days    Zachary [name] Clydene Burack 01/01/2025

## 2025-01-01 NOTE — Consult Note (Addendum)
 WOC Nurse Consult Note: Reason for Consult: consult requested for posterior back wound. Pt had surgery performed on 12/17 by Dr Onetha of the neurosurgical team.  Previous incision site is beginning to dehisce with yellow wound bed and small amt yellow drainage when probed with a swab. Affected area is approx 15X1X.2cm. Dry scabbed skin surrounding the full thickness wound.   Dressing procedure/placement/frequency: Topical treatment recommendations provided for bedside nurses to perform to absorb drainage and provide antimicrobial benefits.   Recommend consult to neurosurgical team for further plan of care.   Please re-consult if further assistance is needed.  Thank-you,  Stephane Fought MSN, RN, CWOCN, CWCN-AP, CNS Contact Mon-Fri 0700-1500: [phone]

## 2025-01-01 NOTE — Progress Notes (Signed)
 " PROGRESS NOTE    YUVIN BUSSIERE  FMW:[id number] DOB: 19-Dec-1946 DOA: 12/25/2024 PCP: Shona Norleen PEDLAR, MD    Brief Narrative:  86 yoM with PMH as below significant for HTN, HLD, CKD 3b, bladder cancer s/p multiple surgeries and TURBT (12/2022), low-grade papillary urethral carcinoma, urinary retention requiring intermittent catheterizations 3-4x/ day, pre-DM, OSA, chronic pain, nephrolithiasis, anxiety, and depression.  Recent hospitalization for T9 vertebral fracture due to mechanical fall requiring decompression and fusion of thoracic spine discharged to rehab on 12/23.  Returns back to the hospital for altered mental status hypotension, tachycardia and hypoxia.  Admitted to the ICU for septic shock secondary to UTI.  Underwent cystoscopy on 12/29 with right-sided ureteral stent placement.  Weaned off pressors on 12/30.  Initial antibiotics IV vancomycin  and Rocephin  was switched to p.o. Augmentin .  Seen by neurosurgery due to recent spinal surgery on 12/17.  Hardware appears to be intact.   Assessment & Plan:  Septic shock secondary to urosepsis.  Pyonephrosis status post stent Leukocytosis - Urine cultures are growing E. coli, Klebsiella and Enterococcus faecalis.  Initially antibiotics were narrowed to Augmentin  but due to worsening leukocytosis they were broadened again to linezolid  and Zosyn .  CT abdomen pelvis and right upper quadrant ultrasound shows distended gallbladder, gallstones and some sludge but no evidence of acute cholecystitis.  LFTs are overall normal.  Monitor fever curve.  If you may need to consult ID  Vertebral fracture, thoracic spine status post decompression/fusion Incisional drainage? - Recently performed on 12/17.  Routine pain control.  Seen by neurosurgery, Dr. Onetha.  Hardware appears to be intact.  Will get MRI of thoracic spine to rule out any obvious infections  Metabolic encephalopathy - Secondary to underlying infection.  Also has delirium.  Supportive  measures  AKI on CKD 3B Hydronephrosis Urinary retention with chronic Foley catheter History of bladder cancer requiring previous TURBT -Continue Foley catheter.  Urology team is following - Baseline creatinine 2.0, peaked at 3.9 during this admission now improving  Essential hypertension - Slowly resume home BP medications as he is able to tolerate.  IV as needed   Hyperlipidemia - Pravastatin   Major depressive disorder Delirium - Cymbalta .  Seen by psychiatry team.  As needed Ativan  and bedtime Remeron  has been added. - B12, TSH, ammonia, folate are normal  TeleSitter ordered for patient's safety  PT/OT Palliative care team assisting with goals of care discussions.  DVT prophylaxis: heparin  injection 5,000 Units Start: 12/25/24 2200 SCDs Start: 12/25/24 1759      Code Status: Limited: Do not attempt resuscitation (DNR) -DNR-LIMITED -Do Not Intubate/DNI  Family Communication:   Status is: Inpatient Remains inpatient appropriate because: Continue antibiotics.   PT Follow up Recs: Skilled Nursing-Short Term Rehab (<3 Hours/Day)12/27/2024 1501  Subjective: Seen at bedside without any complaints.  Follows basic commands  Examination:  General exam: Appears calm and comfortable  Respiratory system: Clear to auscultation. Respiratory effort normal. Cardiovascular system: S1 & S2 heard, RRR. No JVD, murmurs, rubs, gallops or clicks. No pedal edema. Gastrointestinal system: Abdomen is nondistended, soft and nontender. No organomegaly or masses felt. Normal bowel sounds heard. Central nervous system: Alert and oriented. No focal neurological deficits. Extremities: Symmetric 5 x 5 power. Skin: No rashes, lesions or ulcers.  Concerns about lower back incisional drainage Psychiatry: Poor memory Foley catheter is in place           Wound 12/25/24 1714 Pressure Injury Heel Left Deep Tissue Pressure Injury - Purple or maroon localized area  of discolored intact skin or  blood-filled blister due to damage of underlying soft tissue from pressure and/or shear. (Active)     Diet Orders (From admission, onward)     Start     Ordered   12/30/24 2149  Diet Heart Room service appropriate? Yes; Fluid consistency: Thin  Diet effective now       Question Answer Comment  Room service appropriate? Yes   Fluid consistency: Thin      12/30/24 2148            Objective: Vitals:   12/31/24 2000 12/31/24 2337 01/01/25 0429 01/01/25 0751  BP:  126/67 133/67 (!) 143/74  Pulse: 77 76 78 86  Resp: 17 17 19 15   Temp:  97.9 F (36.6 C) 98.6 F (37 C) 98.2 F (36.8 C)  TempSrc:  Axillary Oral Oral  SpO2: 100% 98% 100%   Weight:   83.7 kg   Height:        Intake/Output Summary (Last 24 hours) at 01/01/2025 1100 Last data filed at 01/01/2025 0900 Gross per 24 hour  Intake 990 ml  Output 2225 ml  Net -1235 ml   Filed Weights   12/30/24 0438 12/31/24 0418 01/01/25 0429  Weight: 86.8 kg 83.6 kg 83.7 kg    Scheduled Meds:  carvedilol   6.25 mg Oral BID   Chlorhexidine  Gluconate Cloth  6 each Topical Daily   dorzolamide -timolol   1 drop Both Eyes BID   DULoxetine   60 mg Oral BID   heparin   5,000 Units Subcutaneous Q8H   latanoprost   1 drop Both Eyes QHS   lidocaine   1 patch Transdermal Q24H   lidocaine  (PF)  5 mL     linezolid   600 mg Oral Q12H   melatonin  3 mg Oral QHS   mirtazapine   7.5 mg Oral QHS   nystatin   5 mL Oral QID   polyethylene glycol  17 g Oral Daily   pravastatin   10 mg Oral QHS   Continuous Infusions:  piperacillin -tazobactam (ZOSYN )  IV 3.375 g (01/01/25 0640)    Nutritional status     Body mass index is 24.02 kg/m.  Data Reviewed:   CBC: Recent Labs  Lab 12/28/24 0259 12/29/24 0159 12/30/24 1004 12/31/24 0133 01/01/25 0138  WBC 21.2* 16.4* 26.2* 30.3* 32.3*  HGB 10.9* 11.2* 10.7* 11.7* 10.8*  HCT 33.7* 35.0* 33.8* 37.2* 34.5*  MCV 86.4 89.1 90.1 91.4 89.1  PLT 330 284 264 300 315   Basic Metabolic Panel: Recent  Labs  Lab 12/26/24 0349 12/27/24 1127 12/28/24 0259 12/29/24 0159 12/30/24 0156 12/31/24 0133 01/01/25 0138  NA 137 142 143 143 145 143 141  K 4.7 4.5 4.7 4.8 4.9 5.0 4.6  CL 103 108 109 111 113* 110 109  CO2 21* 20* 21* 22 22 23 24   GLUCOSE 140* 126* 98 98 95 96 115*  BUN 87* 91* 88* 73* 55* 50* 49*  CREATININE 3.76* 3.09* 2.83* 2.48* 2.21* 2.19* 2.45*  CALCIUM 8.1* 8.3* 8.8* 8.4* 8.4* 8.5* 8.3*  MG 2.0 2.2 2.1 1.9 1.8 1.9 1.9  PHOS 5.0* 3.9  --   --   --   --   --    GFR: Estimated Creatinine Clearance: 28.5 mL/min (A) (by C-G formula based on SCr of 2.45 mg/dL (H)). Liver Function Tests: Recent Labs  Lab 12/26/24 0349 12/30/24 1141  AST 23 22  ALT 15 22  ALKPHOS 171* 147*  BILITOT 0.3 0.3  PROT 5.9* 5.9*  ALBUMIN  2.6*  2.6*   No results for input(s): LIPASE, AMYLASE in the last 168 hours. Recent Labs  Lab 12/30/24 1004  AMMONIA 29   Coagulation Profile: Recent Labs  Lab 12/26/24 0349  INR 1.3*   Cardiac Enzymes: No results for input(s): CKTOTAL, CKMB, CKMBINDEX, TROPONINI in the last 168 hours. BNP (last 3 results) Recent Labs    12/25/24 0922  PROBNP 2,105.0*   HbA1C: No results for input(s): HGBA1C in the last 72 hours. CBG: Recent Labs  Lab 12/31/24 1624 12/31/24 1906 12/31/24 2335 01/01/25 0427 01/01/25 0813  GLUCAP 110* 121* 108* 104* 91   Lipid Profile: No results for input(s): CHOL, HDL, LDLCALC, TRIG, CHOLHDL, LDLDIRECT in the last 72 hours. Thyroid  Function Tests: Recent Labs    12/30/24 1004  TSH 2.820   Anemia Panel: Recent Labs    12/30/24 1004  VITAMINB12 1,023*  FOLATE 8.9   Sepsis Labs: Recent Labs  Lab 12/25/24 1115  LATICACIDVEN 2.3*    Recent Results (from the past 240 hours)  Blood Culture (routine x 2)     Status: None   Collection Time: 12/25/24  9:22 AM   Specimen: BLOOD  Result Value Ref Range Status   Specimen Description BLOOD LEFT ARM  Final   Special Requests   Final     BOTTLES DRAWN AEROBIC AND ANAEROBIC Blood Culture adequate volume   Culture   Final    NO GROWTH 5 DAYS Performed at [facility], [address], [place], [place] [address]    Report Status 12/30/2024 FINAL  Final  Blood Culture (routine x 2)     Status: None   Collection Time: 12/25/24 10:08 AM   Specimen: BLOOD  Result Value Ref Range Status   Specimen Description BLOOD BLOOD RIGHT ARM  Final   Special Requests   Final    BOTTLES DRAWN AEROBIC AND ANAEROBIC Blood Culture adequate volume   Culture   Final    NO GROWTH 5 DAYS Performed at [facility], [address], [place], [place] [address]    Report Status 12/30/2024 FINAL  Final  Urine Culture     Status: Abnormal   Collection Time: 12/25/24  3:27 PM   Specimen: Urine, Catheterized  Result Value Ref Range Status   Specimen Description   Final    URINE, CATHETERIZED Performed at [facility], [address], [place], [place] [address]    Special Requests   Final    NONE Performed at [facility], [address], [place], [place] [address]    Culture (A)  Final    60,000 COLONIES/mL ESCHERICHIA COLI >=100,000 COLONIES/mL ENTEROCOCCUS FAECALIS    Report Status 12/27/2024 FINAL  Final   Organism ID, Bacteria ESCHERICHIA COLI (A)  Final   Organism ID, Bacteria ENTEROCOCCUS FAECALIS (A)  Final      Susceptibility   Escherichia coli - MIC*    AMPICILLIN  8 SENSITIVE Sensitive     CEFAZOLIN  (URINE) Value in next row Sensitive      2 SENSITIVEThis is a modified FDA-approved test that has been validated and its performance characteristics determined by the reporting laboratory.  This laboratory is certified under the Clinical Laboratory Improvement Amendments CLIA as qualified to perform high complexity clinical laboratory testing.    CEFEPIME  Value in next row Sensitive      2 SENSITIVEThis is a modified FDA-approved test that has been validated and its performance characteristics determined by the reporting laboratory.  This  laboratory is certified under the Clinical Laboratory Improvement Amendments  CLIA as qualified to perform high complexity clinical laboratory testing.    ERTAPENEM Value in next row Sensitive      2 SENSITIVEThis is a modified FDA-approved test that has been validated and its performance characteristics determined by the reporting laboratory.  This laboratory is certified under the Clinical Laboratory Improvement Amendments CLIA as qualified to perform high complexity clinical laboratory testing.    CEFTRIAXONE  Value in next row Sensitive      2 SENSITIVEThis is a modified FDA-approved test that has been validated and its performance characteristics determined by the reporting laboratory.  This laboratory is certified under the Clinical Laboratory Improvement Amendments CLIA as qualified to perform high complexity clinical laboratory testing.    CIPROFLOXACIN  Value in next row Sensitive      2 SENSITIVEThis is a modified FDA-approved test that has been validated and its performance characteristics determined by the reporting laboratory.  This laboratory is certified under the Clinical Laboratory Improvement Amendments CLIA as qualified to perform high complexity clinical laboratory testing.    GENTAMICIN Value in next row Sensitive      2 SENSITIVEThis is a modified FDA-approved test that has been validated and its performance characteristics determined by the reporting laboratory.  This laboratory is certified under the Clinical Laboratory Improvement Amendments CLIA as qualified to perform high complexity clinical laboratory testing.    NITROFURANTOIN Value in next row Sensitive      2 SENSITIVEThis is a modified FDA-approved test that has been validated and its performance characteristics determined by the reporting laboratory.  This laboratory is certified under the Clinical Laboratory Improvement Amendments CLIA as qualified to perform high complexity clinical laboratory testing.    TRIMETH/SULFA  Value in next row Sensitive      2 SENSITIVEThis is a modified FDA-approved test that has been validated and its performance characteristics determined by the reporting laboratory.  This laboratory is certified under the Clinical Laboratory Improvement Amendments CLIA as qualified to perform high complexity clinical laboratory testing.    AMPICILLIN /SULBACTAM Value in next row Sensitive      2 SENSITIVEThis is a modified FDA-approved test that has been validated and its performance characteristics determined by the reporting laboratory.  This laboratory is certified under the Clinical Laboratory Improvement Amendments CLIA as qualified to perform high complexity clinical laboratory testing.    PIP/TAZO Value in next row Sensitive      <=4 SENSITIVEThis is a modified FDA-approved test that has been validated and its performance characteristics determined by the reporting laboratory.  This laboratory is certified under the Clinical Laboratory Improvement Amendments CLIA as qualified to perform high complexity clinical laboratory testing.    MEROPENEM Value in next row Sensitive      <=4 SENSITIVEThis is a modified FDA-approved test that has been validated and its performance characteristics determined by the reporting laboratory.  This laboratory is certified under the Clinical Laboratory Improvement Amendments CLIA as qualified to perform high complexity clinical laboratory testing.    * 60,000 COLONIES/mL ESCHERICHIA COLI   Enterococcus faecalis - MIC*    AMPICILLIN  Value in next row Sensitive      <=4 SENSITIVEThis is a modified FDA-approved test that has been validated and its performance characteristics determined by the reporting laboratory.  This laboratory is certified under the Clinical Laboratory Improvement Amendments CLIA as qualified to perform high complexity clinical laboratory testing.    NITROFURANTOIN Value in next row Sensitive      <=4 SENSITIVEThis is a modified FDA-approved test that  has been  validated and its performance characteristics determined by the reporting laboratory.  This laboratory is certified under the Clinical Laboratory Improvement Amendments CLIA as qualified to perform high complexity clinical laboratory testing.    VANCOMYCIN  Value in next row Sensitive      <=4 SENSITIVEThis is a modified FDA-approved test that has been validated and its performance characteristics determined by the reporting laboratory.  This laboratory is certified under the Clinical Laboratory Improvement Amendments CLIA as qualified to perform high complexity clinical laboratory testing.    * >=100,000 COLONIES/mL ENTEROCOCCUS FAECALIS  Urine Culture     Status: Abnormal   Collection Time: 12/25/24  9:01 PM   Specimen: Kidney, Right; Urine  Result Value Ref Range Status   Specimen Description URINE, RANDOM  Final   Special Requests RIGHT RENAL PELVIS SPEC A  Final   Culture (A)  Final    >=100,000 COLONIES/mL KLEBSIELLA PNEUMONIAE 30,000 COLONIES/mL ENTEROCOCCUS RAFFINOSUS Standardized susceptibility testing for this organism is not available. Performed at [facility], [address], [place], [place] [address]    Report Status 12/29/2024 FINAL  Final   Organism ID, Bacteria KLEBSIELLA PNEUMONIAE (A)  Final      Susceptibility   Klebsiella pneumoniae - MIC*    AMPICILLIN  RESISTANT Resistant     CEFAZOLIN  (URINE) Value in next row Sensitive      2 SENSITIVEThis is a modified FDA-approved test that has been validated and its performance characteristics determined by the reporting laboratory.  This laboratory is certified under the Clinical Laboratory Improvement Amendments CLIA as qualified to perform high complexity clinical laboratory testing.    CEFEPIME  Value in next row Sensitive      2 SENSITIVEThis is a modified FDA-approved test that has been validated and its performance characteristics determined by the reporting laboratory.  This laboratory is certified under the  Clinical Laboratory Improvement Amendments CLIA as qualified to perform high complexity clinical laboratory testing.    ERTAPENEM Value in next row Sensitive      2 SENSITIVEThis is a modified FDA-approved test that has been validated and its performance characteristics determined by the reporting laboratory.  This laboratory is certified under the Clinical Laboratory Improvement Amendments CLIA as qualified to perform high complexity clinical laboratory testing.    CEFTRIAXONE  Value in next row Sensitive      2 SENSITIVEThis is a modified FDA-approved test that has been validated and its performance characteristics determined by the reporting laboratory.  This laboratory is certified under the Clinical Laboratory Improvement Amendments CLIA as qualified to perform high complexity clinical laboratory testing.    CIPROFLOXACIN  Value in next row Sensitive      2 SENSITIVEThis is a modified FDA-approved test that has been validated and its performance characteristics determined by the reporting laboratory.  This laboratory is certified under the Clinical Laboratory Improvement Amendments CLIA as qualified to perform high complexity clinical laboratory testing.    GENTAMICIN Value in next row Sensitive      2 SENSITIVEThis is a modified FDA-approved test that has been validated and its performance characteristics determined by the reporting laboratory.  This laboratory is certified under the Clinical Laboratory Improvement Amendments CLIA as qualified to perform high complexity clinical laboratory testing.    NITROFURANTOIN Value in next row Intermediate      2 SENSITIVEThis is a modified FDA-approved test that has been validated and its performance characteristics determined by the reporting laboratory.  This laboratory is certified under the Clinical Laboratory Improvement Amendments CLIA as qualified to  perform high complexity clinical laboratory testing.    TRIMETH/SULFA Value in next row Sensitive       2 SENSITIVEThis is a modified FDA-approved test that has been validated and its performance characteristics determined by the reporting laboratory.  This laboratory is certified under the Clinical Laboratory Improvement Amendments CLIA as qualified to perform high complexity clinical laboratory testing.    AMPICILLIN /SULBACTAM Value in next row Sensitive      2 SENSITIVEThis is a modified FDA-approved test that has been validated and its performance characteristics determined by the reporting laboratory.  This laboratory is certified under the Clinical Laboratory Improvement Amendments CLIA as qualified to perform high complexity clinical laboratory testing.    PIP/TAZO Value in next row Sensitive      <=4 SENSITIVEThis is a modified FDA-approved test that has been validated and its performance characteristics determined by the reporting laboratory.  This laboratory is certified under the Clinical Laboratory Improvement Amendments CLIA as qualified to perform high complexity clinical laboratory testing.    MEROPENEM Value in next row Sensitive      <=4 SENSITIVEThis is a modified FDA-approved test that has been validated and its performance characteristics determined by the reporting laboratory.  This laboratory is certified under the Clinical Laboratory Improvement Amendments CLIA as qualified to perform high complexity clinical laboratory testing.    * >=100,000 COLONIES/mL KLEBSIELLA PNEUMONIAE         Radiology Studies: US  Abdomen Limited RUQ (LIVER/GB) Result Date: 12/30/2024 EXAM: Right Upper Quadrant Abdominal Ultrasound 12/30/2024 08:59:56 PM TECHNIQUE: Real-time ultrasonography of the right upper quadrant of the abdomen was performed. COMPARISON: CT abdomen and pelvis 12/30/2024. CLINICAL HISTORY: Gallbladder dilatation. FINDINGS: LIVER: Normal echogenicity. No intrahepatic biliary ductal dilatation. No evidence of mass. Hepatopetal flow in the portal vein. BILIARY SYSTEM: Multiple stones and  echogenic sludge within the gallbladder. No pericholecystic fluid or wall thickening. Murphy sign is negative. Bile ducts are not well visualized due to overlying bowel gas, but no ductal dilatation is identified. Unable to specifically measure the common bile duct. RIGHT KIDNEY: No hydronephrosis. No echogenic calculi. Incidental note of subcentimeter cysts on the right kidney. No imaging follow-up is indicated. PANCREAS: Visualized portions of the pancreas are unremarkable. OTHER: No right upper quadrant ascites. IMPRESSION: 1. Multiple stones and tumefactive sludge within the gallbladder without sonographic evidence of acute cholecystitis. 2. Bile ducts are not well visualized due to overlying bowel gas, but no ductal dilatation is identified; the common bile duct cannot be specifically measured. Electronically signed by: Elsie Gravely MD 12/30/2024 09:03 PM EST RP Workstation: [id number]   CT ABDOMEN PELVIS WO CONTRAST Result Date: 12/30/2024 CLINICAL DATA:  Sepsis, distended gallbladder on recent CT. EXAM: CT ABDOMEN AND PELVIS WITHOUT CONTRAST TECHNIQUE: Multidetector CT imaging of the abdomen and pelvis was performed following the standard protocol without IV contrast. RADIATION DOSE REDUCTION: This exam was performed according to the departmental dose-optimization program which includes automated exposure control, adjustment of the mA and/or kV according to patient size and/or use of iterative reconstruction technique. COMPARISON:  CT chest 12/25/2024. FINDINGS: Lower chest: Small bilateral pleural effusions with collapse/consolidation in both lower lobes. Atherosclerotic calcification of the aorta and right coronary artery. Heart is enlarged. No pericardial effusion. Distal esophagus is grossly unremarkable. Hepatobiliary: Patient's arms create streak artifact in the abdomen, degrading image quality. Liver is enlarged, 19.6 cm. Liver is otherwise grossly unremarkable. There may be dependent sludge or  stones in the gallbladder versus streak artifact. No biliary ductal dilatation. Pancreas: Negative. Spleen: Negative.  Adrenals/Urinary Tract: Slight nodular thickening of the body of the right adrenal gland. No specific follow-up necessary. Low-attenuation lesions in the right kidney. No specific follow-up necessary. Scarring in the kidneys bilaterally. Double-J right ureteral stent with the proximal loop formed in the right intrarenal collecting system and distal loop formed in the bladder. Previously seen 4 mm lower right ureteral stone appears to still be present within the lower right ureter (3/75). Punctate left renal stones. Ureters are decompressed. Foley catheter and air are seen in a thick-walled bladder. Stomach/Bowel: Stomach, small bowel, appendix and colon are unremarkable. Vascular/Lymphatic: Atherosclerotic calcification of the aorta. No pathologically enlarged lymph nodes. 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 Reproductive: Prostate is visualized. Other: Small bilateral inguinal hernias contain fat. No free fluid. Tiny umbilical hernia contains fat. Musculoskeletal: Hip screw and intramedullary rod in the proximal left femur. Osteopenia. Degenerative changes in the spine. At least partially fused sacroiliac joints. Partially imaged T8-T11 posterior fusion. L3-L5 posterior lumbar interbody fusion with interbody cages. IMPRESSION: 1. Small bilateral pleural effusions with bibasilar collapse/consolidation. Difficult to exclude pneumonia. 2. Sludge and/or stone debris in the bladder versus streak artifact from the patient's right arm. Consider right upper quadrant ultrasound, as clinically necessary. 3. Hepatomegaly. 4. Double-J right ureteral stent in place. Previously seen 4 mm stone appears to still be within the lower right ureter (3/75). No hydronephrosis. 5. Punctate left renal stones. 6. Aortic atherosclerosis (ICD10-I70.0). Coronary  artery calcification. 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 Electronically Signed   By: Newell Eke M.D.   On: 12/30/2024 15:44   DG Chest Port 1 View Result Date: 12/30/2024 CLINICAL DATA:  Dyspnea EXAM: PORTABLE CHEST 1 VIEW COMPARISON:  Four days ago FINDINGS: Stable cardiomediastinal silhouette. Right lung is clear. Minimal left basilar subsegmental atelectasis or scarring is noted. Status post surgical posterior fusion of lower thoracic spine. IMPRESSION: Minimal left basilar subsegmental atelectasis or scarring. Electronically Signed   By: Lynwood Landy Raddle M.D.   On: 12/30/2024 14:16           LOS: 7 days   Time spent= 35 mins    Burgess JAYSON Dare, MD [facility]  If 7PM-7AM, please contact night-coverage  01/01/2025, 11:00 AM  "

## 2025-01-02 ENCOUNTER — Inpatient Hospital Stay (HOSPITAL_COMMUNITY)

## 2025-01-02 DIAGNOSIS — A419 Sepsis, unspecified organism: Secondary | ICD-10-CM | POA: Diagnosis not present

## 2025-01-02 DIAGNOSIS — R6521 Severe sepsis with septic shock: Secondary | ICD-10-CM | POA: Diagnosis not present

## 2025-01-02 DIAGNOSIS — E43 Unspecified severe protein-calorie malnutrition: Secondary | ICD-10-CM | POA: Insufficient documentation

## 2025-01-02 LAB — CBC
HCT: 32.9 % — ABNORMAL LOW (ref 39.0–52.0)
Hemoglobin: 10.4 g/dL — ABNORMAL LOW (ref 13.0–17.0)
MCH: 28.4 pg (ref 26.0–34.0)
MCHC: 31.6 g/dL (ref 30.0–36.0)
MCV: 89.9 fL (ref 80.0–100.0)
Platelets: 321 K/uL (ref 150–400)
RBC: 3.66 MIL/uL — ABNORMAL LOW (ref 4.22–5.81)
RDW: 14.9 % (ref 11.5–15.5)
WBC: 22.3 K/uL — ABNORMAL HIGH (ref 4.0–10.5)
nRBC: 0 % (ref 0.0–0.2)

## 2025-01-02 LAB — BASIC METABOLIC PANEL WITH GFR
Anion gap: 11 (ref 5–15)
BUN: 47 mg/dL — ABNORMAL HIGH (ref 8–23)
CO2: 24 mmol/L (ref 22–32)
Calcium: 8.3 mg/dL — ABNORMAL LOW (ref 8.9–10.3)
Chloride: 107 mmol/L (ref 98–111)
Creatinine, Ser: 2.63 mg/dL — ABNORMAL HIGH (ref 0.61–1.24)
GFR, Estimated: 24 mL/min — ABNORMAL LOW
Glucose, Bld: 131 mg/dL — ABNORMAL HIGH (ref 70–99)
Potassium: 4.4 mmol/L (ref 3.5–5.1)
Sodium: 141 mmol/L (ref 135–145)

## 2025-01-02 LAB — GLUCOSE, CAPILLARY
Glucose-Capillary: 102 mg/dL — ABNORMAL HIGH (ref 70–99)
Glucose-Capillary: 115 mg/dL — ABNORMAL HIGH (ref 70–99)
Glucose-Capillary: 130 mg/dL — ABNORMAL HIGH (ref 70–99)
Glucose-Capillary: 132 mg/dL — ABNORMAL HIGH (ref 70–99)
Glucose-Capillary: 137 mg/dL — ABNORMAL HIGH (ref 70–99)
Glucose-Capillary: 94 mg/dL (ref 70–99)

## 2025-01-02 NOTE — Progress Notes (Signed)
 Physical Therapy Treatment Patient Details Name: Zachary [name] MRN: [id number] DOB: June 27, 1946 Today's Date: 01/02/2025   History of Present Illness Pt is 79 yo presenting to [facility] on 12/25/24 due to AMS, hypotension, tachycardia, and hypoxia, sent from rehab facility. Pt recently hospitalized for mechanical fall with resultant T9 fx s/p PCDF T7-11 on 12/13/24. PMHx: HLD, HTN, CKD, bladder CA, DM, OSA.    PT Comments  Pt resting in bed on arrival, demonstrating slow progress towards acute goals due to pain, poor balance/postural reactions, impaired cognition and global weakness. Pt continues to require max A +2 to complete bed mobility with max cues for sequencing and initiation, and max A to maintain sitting balance at EOB due to strong posterior lean. Transfers deferred for pt safety due to poor sitting balance. Pt requiring heavy max A to roll R/L for assist with peri-care. Pt placed in modified chair position at end of session with all needs met and NT present to assist with lunch tray. Pt continues to benefit from skilled PT services to progress toward functional mobility goals.     If plan is discharge home, recommend the following: Assistance with cooking/housework;Assist for transportation;Help with stairs or ramp for entrance;Two people to help with walking and/or transfers   Can travel by private vehicle     No  Equipment Recommendations  Wheelchair (measurements PT);Wheelchair cushion (measurements PT);BSC/3in1;Hoyer lift;Hospital bed    Recommendations for Other Services       Precautions / Restrictions Precautions Precautions: Fall;Back Precaution Booklet Issued: No Recall of Precautions/Restrictions: Impaired Precaution/Restrictions Comments: TLSO not in hospital room Required Braces or Orthoses: Spinal Brace Spinal Brace: Thoracolumbosacral orthotic Restrictions Weight Bearing Restrictions Per Provider Order: No     Mobility  Bed Mobility Overal bed mobility: Needs  Assistance Bed Mobility: Sidelying to Sit, Rolling, Sit to Sidelying Rolling: Max assist, +2 for physical assistance Sidelying to sit: Max assist, +2 for physical assistance     Sit to sidelying: Max assist, +2 for physical assistance General bed mobility comments: assist for trunk elevation and LE mgmt, cues for log roll technique    Transfers Overall transfer level: Needs assistance                 General transfer comment: deferred due to poor sitting balance with posteior lean throughout    Ambulation/Gait                   Stairs             Wheelchair Mobility     Tilt Bed    Modified Rankin (Stroke Patients Only)       Balance Overall balance assessment: Needs assistance Sitting-balance support: Single extremity supported, Feet supported Sitting balance-Leahy Scale: Poor Sitting balance - Comments: mod-max A to maintain sitting balance due to posteior bias Postural control: Posterior lean (vs posterior pushing)                                  Communication Communication Communication: Impaired Factors Affecting Communication: Hearing impaired (mildly HoH)  Cognition Arousal: Alert Behavior During Therapy: Anxious, Flat affect   PT - Cognitive impairments: History of cognitive impairments                         Following commands: Impaired Following commands impaired: Follows one step commands inconsistently    Cueing Cueing Techniques: Verbal cues, Visual cues,  Tactile cues  Exercises      General Comments General comments (skin integrity, edema, etc.): VSS on RA      Pertinent Vitals/Pain Pain Assessment Pain Assessment: Faces Faces Pain Scale: Hurts little more Pain Location: back Pain Descriptors / Indicators: Discomfort, Grimacing Pain Intervention(s): Limited activity within patient's tolerance, Monitored during session, Repositioned    Home Living                          Prior  Function            PT Goals (current goals can now be found in the care plan section) Acute Rehab PT Goals Patient Stated Goal: decrease pain PT Goal Formulation: With patient/family Time For Goal Achievement: 01/10/25 Progress towards PT goals: Not progressing toward goals - comment (pain and cognition)    Frequency    Min 2X/week      PT Plan      Co-evaluation              AM-PAC PT 6 Clicks Mobility   Outcome Measure  Help needed turning from your back to your side while in a flat bed without using bedrails?: Total Help needed moving from lying on your back to sitting on the side of a flat bed without using bedrails?: Total Help needed moving to and from a bed to a chair (including a wheelchair)?: Total Help needed standing up from a chair using your arms (e.g., wheelchair or bedside chair)?: Total Help needed to walk in hospital room?: Total Help needed climbing 3-5 steps with a railing? : Total 6 Click Score: 6    End of Session   Activity Tolerance: Patient limited by pain Patient left: in bed;with call bell/phone within reach;with bed alarm set;with nursing/sitter in room Nurse Communication: Mobility status;Need for lift equipment PT Visit Diagnosis: Other abnormalities of gait and mobility (R26.89);Muscle weakness (generalized) (M62.81);Pain Pain - Right/Left:  (back)     Time: 8841-8778 PT Time Calculation (min) (ACUTE ONLY): 23 min  Charges:    $Therapeutic Activity: 23-37 mins PT General Charges $$ ACUTE PT VISIT: 1 Visit                     Layne Lebon R. PTA Acute Rehabilitation Services Office: [phone]   Therisa CHRISTELLA Boor 01/02/2025, 1:03 PM

## 2025-01-02 NOTE — Plan of Care (Signed)

## 2025-01-02 NOTE — Progress Notes (Signed)
 " PROGRESS NOTE    Zachary [name]  FMW:[id number] DOB: 03/06/1946 DOA: 12/25/2024 PCP: Shona Norleen PEDLAR, MD    Brief Narrative:  38 yoM with PMH as below significant for HTN, HLD, CKD 3b, bladder cancer s/p multiple surgeries and TURBT (12/2022), low-grade papillary urethral carcinoma, urinary retention requiring intermittent catheterizations 3-4x/ day, pre-DM, OSA, chronic pain, nephrolithiasis, anxiety, and depression.  Recent hospitalization for T9 vertebral fracture due to mechanical fall requiring decompression and fusion of thoracic spine discharged to rehab on 12/23.  Returns back to the hospital for altered mental status hypotension, tachycardia and hypoxia.  Admitted to the ICU for septic shock secondary to UTI.  Underwent cystoscopy on 12/29 with right-sided ureteral stent placement.  Weaned off pressors on 12/30.  Initial antibiotics IV vancomycin  and Rocephin  was switched to p.o. Augmentin .  Seen by neurosurgery due to recent spinal surgery on 12/17.  Hardware appears to be intact.   Assessment & Plan:  Septic shock secondary to urosepsis.  Pyonephrosis status post stent Leukocytosis - Urine cultures are growing E. coli, Klebsiella and Enterococcus faecalis.  Initially antibiotics were narrowed to Augmentin  but due to worsening leukocytosis they were broadened again to linezolid  and Zosyn .  CT abdomen pelvis and right upper quadrant ultrasound shows distended gallbladder, gallstones and some sludge but no evidence of acute cholecystitis.  LFTs are overall normal.  Monitor fever curve.  If you may need to consult ID  Vertebral fracture, thoracic spine status post decompression/fusion Incisional drainage? - Recently performed on 12/17.  Routine pain control.  Seen by neurosurgery, Dr. Onetha.  Hardware appears to be intact. - MRI thoracic spine-results pending, radiology notified  Metabolic encephalopathy - Secondary to underlying infection.  Also has delirium.  Supportive measures  AKI on  CKD 3B Hydronephrosis Urinary retention with chronic Foley catheter History of bladder cancer requiring previous TURBT -Continue Foley catheter.  Urology team is following - Baseline creatinine 2.0, peaked at 3.9 during this admission now stable around 2.5  Essential hypertension - Slowly resume home BP medications as he is able to tolerate.  IV as needed   Hyperlipidemia - Pravastatin   Major depressive disorder Delirium - Cymbalta .  Seen by psychiatry team.  As needed Ativan  and bedtime Remeron  has been added. - B12, TSH, ammonia, folate are normal  TeleSitter ordered for patient's safety  PT/OT Palliative care team assisting with goals of care discussions.  DVT prophylaxis: heparin  injection 5,000 Units Start: 12/25/24 2200 SCDs Start: 12/25/24 1759      Code Status: Limited: Do not attempt resuscitation (DNR) -DNR-LIMITED -Do Not Intubate/DNI  Family Communication:   Status is: Inpatient Remains inpatient appropriate because: Continue antibiotics.   PT Follow up Recs: Skilled Nursing-Short Term Rehab (<3 Hours/Day)12/27/2024 1501  Subjective: Seen at bedside, resting comfortably.  Does not have any complaints besides some back discomfort.  Able to follow very basic commands and responds to basic answers  Examination:  General exam: Appears calm and comfortable  Respiratory system: Clear to auscultation. Respiratory effort normal. Cardiovascular system: S1 & S2 heard, RRR. No JVD, murmurs, rubs, gallops or clicks. No pedal edema. Gastrointestinal system: Abdomen is nondistended, soft and nontender. No organomegaly or masses felt. Normal bowel sounds heard. Central nervous system: Alert and oriented to name and place. No focal neurological deficits. Extremities: Symmetric 5 x 5 power. Skin: No rashes, lesions or ulcers.  Concerns about lower back incisional drainage Psychiatry: Poor memory Foley catheter is in place  Wound 12/25/24 1714 Pressure  Injury Heel Left Deep Tissue Pressure Injury - Purple or maroon localized area of discolored intact skin or blood-filled blister due to damage of underlying soft tissue from pressure and/or shear. (Active)     Diet Orders (From admission, onward)     Start     Ordered   01/01/25 1452  Diet regular Room service appropriate? Yes with Assist; Fluid consistency: Thin  Diet effective now       Question Answer Comment  Room service appropriate? Yes with Assist   Fluid consistency: Thin      01/01/25 1452            Objective: Vitals:   01/01/25 2008 01/01/25 2312 01/02/25 0605 01/02/25 0812  BP: 122/73 103/78 125/64   Pulse:   86   Resp: 15 19 17  (!) 27  Temp: 98.5 F (36.9 C) 98 F (36.7 C) 97.7 F (36.5 C) 97.8 F (36.6 C)  TempSrc: Oral Oral Oral Oral  SpO2: 98% 98% 95% 96%  Weight:   82.3 kg   Height:        Intake/Output Summary (Last 24 hours) at 01/02/2025 0957 Last data filed at 01/02/2025 9391 Gross per 24 hour  Intake 240 ml  Output 1150 ml  Net -910 ml   Filed Weights   12/31/24 0418 01/01/25 0429 01/02/25 0605  Weight: 83.6 kg 83.7 kg 82.3 kg    Scheduled Meds:  carvedilol   6.25 mg Oral BID   Chlorhexidine  Gluconate Cloth  6 each Topical Daily   dorzolamide -timolol   1 drop Both Eyes BID   DULoxetine   60 mg Oral BID   feeding supplement  237 mL Oral TID BM   heparin   5,000 Units Subcutaneous Q8H   latanoprost   1 drop Both Eyes QHS   lidocaine   1 patch Transdermal Q24H   linezolid   600 mg Oral Q12H   melatonin  3 mg Oral QHS   mirtazapine   7.5 mg Oral QHS   multivitamin with minerals  1 tablet Oral Daily   nystatin   5 mL Oral QID   polyethylene glycol  17 g Oral Daily   pravastatin   10 mg Oral QHS   Continuous Infusions:  piperacillin -tazobactam (ZOSYN )  IV 3.375 g (01/02/25 9391)    Nutritional status Signs/Symptoms: moderate muscle depletion, percent weight loss (11% weight loss within 1 month) Percent weight loss: 11 % Interventions: Ensure  Enlive (each supplement provides 350kcal and 20 grams of protein), Liberalize Diet Body mass index is 23.61 kg/m.  Data Reviewed:   CBC: Recent Labs  Lab 12/29/24 0159 12/30/24 1004 12/31/24 0133 01/01/25 0138 01/02/25 0235  WBC 16.4* 26.2* 30.3* 32.3* 22.3*  HGB 11.2* 10.7* 11.7* 10.8* 10.4*  HCT 35.0* 33.8* 37.2* 34.5* 32.9*  MCV 89.1 90.1 91.4 89.1 89.9  PLT 284 264 300 315 321   Basic Metabolic Panel: Recent Labs  Lab 12/27/24 1127 12/28/24 0259 12/29/24 0159 12/30/24 0156 12/31/24 0133 01/01/25 0138 01/02/25 0235  NA 142 143 143 145 143 141 141  K 4.5 4.7 4.8 4.9 5.0 4.6 4.4  CL 108 109 111 113* 110 109 107  CO2 20* 21* 22 22 23 24 24   GLUCOSE 126* 98 98 95 96 115* 131*  BUN 91* 88* 73* 55* 50* 49* 47*  CREATININE 3.09* 2.83* 2.48* 2.21* 2.19* 2.45* 2.63*  CALCIUM 8.3* 8.8* 8.4* 8.4* 8.5* 8.3* 8.3*  MG 2.2 2.1 1.9 1.8 1.9 1.9  --   PHOS 3.9  --   --   --   --   --   --  GFR: Estimated Creatinine Clearance: 26.6 mL/min (A) (by C-G formula based on SCr of 2.63 mg/dL (H)). Liver Function Tests: Recent Labs  Lab 12/30/24 1141  AST 22  ALT 22  ALKPHOS 147*  BILITOT 0.3  PROT 5.9*  ALBUMIN  2.6*   No results for input(s): LIPASE, AMYLASE in the last 168 hours. Recent Labs  Lab 12/30/24 1004  AMMONIA 29   Coagulation Profile: No results for input(s): INR, PROTIME in the last 168 hours. Cardiac Enzymes: No results for input(s): CKTOTAL, CKMB, CKMBINDEX, TROPONINI in the last 168 hours. BNP (last 3 results) Recent Labs    12/25/24 0922  PROBNP 2,105.0*   HbA1C: No results for input(s): HGBA1C in the last 72 hours. CBG: Recent Labs  Lab 01/01/25 1207 01/01/25 1616 01/01/25 2025 01/02/25 0038 01/02/25 0605  GLUCAP 98 98 122* 130* 102*   Lipid Profile: No results for input(s): CHOL, HDL, LDLCALC, TRIG, CHOLHDL, LDLDIRECT in the last 72 hours. Thyroid  Function Tests: Recent Labs    12/30/24 1004  TSH 2.820    Anemia Panel: Recent Labs    12/30/24 1004  VITAMINB12 1,023*  FOLATE 8.9   Sepsis Labs: No results for input(s): PROCALCITON, LATICACIDVEN in the last 168 hours.  Recent Results (from the past 240 hours)  Blood Culture (routine x 2)     Status: None   Collection Time: 12/25/24  9:22 AM   Specimen: BLOOD  Result Value Ref Range Status   Specimen Description BLOOD LEFT ARM  Final   Special Requests   Final    BOTTLES DRAWN AEROBIC AND ANAEROBIC Blood Culture adequate volume   Culture   Final    NO GROWTH 5 DAYS Performed at [facility], [address] [place], [place] [address]    Report Status 12/30/2024 FINAL  Final  Blood Culture (routine x 2)     Status: None   Collection Time: 12/25/24 10:08 AM   Specimen: BLOOD  Result Value Ref Range Status   Specimen Description BLOOD BLOOD RIGHT ARM  Final   Special Requests   Final    BOTTLES DRAWN AEROBIC AND ANAEROBIC Blood Culture adequate volume   Culture   Final    NO GROWTH 5 DAYS Performed at [facility], [address], [place], [place] [address]    Report Status 12/30/2024 FINAL  Final  Urine Culture     Status: Abnormal   Collection Time: 12/25/24  3:27 PM   Specimen: Urine, Catheterized  Result Value Ref Range Status   Specimen Description   Final    URINE, CATHETERIZED Performed at [facility], [address] [place], [place] [address]    Special Requests   Final    NONE Performed at [facility], [address] [place], [place] [address]    Culture (A)  Final    60,000 COLONIES/mL ESCHERICHIA COLI >=100,000 COLONIES/mL ENTEROCOCCUS FAECALIS    Report Status 12/27/2024 FINAL  Final   Organism ID, Bacteria ESCHERICHIA COLI (A)  Final   Organism ID, Bacteria ENTEROCOCCUS FAECALIS (A)  Final      Susceptibility   Escherichia coli - MIC*    AMPICILLIN  8 SENSITIVE Sensitive     CEFAZOLIN  (URINE) Value in next row Sensitive      2 SENSITIVEThis is a modified FDA-approved test that has been validated  and its performance characteristics determined by the reporting laboratory.  This laboratory is certified under the Clinical Laboratory Improvement Amendments CLIA as qualified to perform high complexity clinical laboratory testing.  CEFEPIME  Value in next row Sensitive      2 SENSITIVEThis is a modified FDA-approved test that has been validated and its performance characteristics determined by the reporting laboratory.  This laboratory is certified under the Clinical Laboratory Improvement Amendments CLIA as qualified to perform high complexity clinical laboratory testing.    ERTAPENEM Value in next row Sensitive      2 SENSITIVEThis is a modified FDA-approved test that has been validated and its performance characteristics determined by the reporting laboratory.  This laboratory is certified under the Clinical Laboratory Improvement Amendments CLIA as qualified to perform high complexity clinical laboratory testing.    CEFTRIAXONE  Value in next row Sensitive      2 SENSITIVEThis is a modified FDA-approved test that has been validated and its performance characteristics determined by the reporting laboratory.  This laboratory is certified under the Clinical Laboratory Improvement Amendments CLIA as qualified to perform high complexity clinical laboratory testing.    CIPROFLOXACIN  Value in next row Sensitive      2 SENSITIVEThis is a modified FDA-approved test that has been validated and its performance characteristics determined by the reporting laboratory.  This laboratory is certified under the Clinical Laboratory Improvement Amendments CLIA as qualified to perform high complexity clinical laboratory testing.    GENTAMICIN Value in next row Sensitive      2 SENSITIVEThis is a modified FDA-approved test that has been validated and its performance characteristics determined by the reporting laboratory.  This laboratory is certified under the Clinical Laboratory Improvement Amendments CLIA as qualified  to perform high complexity clinical laboratory testing.    NITROFURANTOIN Value in next row Sensitive      2 SENSITIVEThis is a modified FDA-approved test that has been validated and its performance characteristics determined by the reporting laboratory.  This laboratory is certified under the Clinical Laboratory Improvement Amendments CLIA as qualified to perform high complexity clinical laboratory testing.    TRIMETH/SULFA Value in next row Sensitive      2 SENSITIVEThis is a modified FDA-approved test that has been validated and its performance characteristics determined by the reporting laboratory.  This laboratory is certified under the Clinical Laboratory Improvement Amendments CLIA as qualified to perform high complexity clinical laboratory testing.    AMPICILLIN /SULBACTAM Value in next row Sensitive      2 SENSITIVEThis is a modified FDA-approved test that has been validated and its performance characteristics determined by the reporting laboratory.  This laboratory is certified under the Clinical Laboratory Improvement Amendments CLIA as qualified to perform high complexity clinical laboratory testing.    PIP/TAZO Value in next row Sensitive      <=4 SENSITIVEThis is a modified FDA-approved test that has been validated and its performance characteristics determined by the reporting laboratory.  This laboratory is certified under the Clinical Laboratory Improvement Amendments CLIA as qualified to perform high complexity clinical laboratory testing.    MEROPENEM Value in next row Sensitive      <=4 SENSITIVEThis is a modified FDA-approved test that has been validated and its performance characteristics determined by the reporting laboratory.  This laboratory is certified under the Clinical Laboratory Improvement Amendments CLIA as qualified to perform high complexity clinical laboratory testing.    * 60,000 COLONIES/mL ESCHERICHIA COLI   Enterococcus faecalis - MIC*    AMPICILLIN  Value in next  row Sensitive      <=4 SENSITIVEThis is a modified FDA-approved test that has been validated and its performance characteristics determined by the reporting laboratory.  This laboratory  is certified under the Clinical Laboratory Improvement Amendments CLIA as qualified to perform high complexity clinical laboratory testing.    NITROFURANTOIN Value in next row Sensitive      <=4 SENSITIVEThis is a modified FDA-approved test that has been validated and its performance characteristics determined by the reporting laboratory.  This laboratory is certified under the Clinical Laboratory Improvement Amendments CLIA as qualified to perform high complexity clinical laboratory testing.    VANCOMYCIN  Value in next row Sensitive      <=4 SENSITIVEThis is a modified FDA-approved test that has been validated and its performance characteristics determined by the reporting laboratory.  This laboratory is certified under the Clinical Laboratory Improvement Amendments CLIA as qualified to perform high complexity clinical laboratory testing.    * >=100,000 COLONIES/mL ENTEROCOCCUS FAECALIS  Urine Culture     Status: Abnormal   Collection Time: 12/25/24  9:01 PM   Specimen: Kidney, Right; Urine  Result Value Ref Range Status   Specimen Description URINE, RANDOM  Final   Special Requests RIGHT RENAL PELVIS SPEC A  Final   Culture (A)  Final    >=100,000 COLONIES/mL KLEBSIELLA PNEUMONIAE 30,000 COLONIES/mL ENTEROCOCCUS RAFFINOSUS Standardized susceptibility testing for this organism is not available. Performed at [facility], [address] [place], [place] [address]    Report Status 12/29/2024 FINAL  Final   Organism ID, Bacteria KLEBSIELLA PNEUMONIAE (A)  Final      Susceptibility   Klebsiella pneumoniae - MIC*    AMPICILLIN  RESISTANT Resistant     CEFAZOLIN  (URINE) Value in next row Sensitive      2 SENSITIVEThis is a modified FDA-approved test that has been validated and its performance characteristics  determined by the reporting laboratory.  This laboratory is certified under the Clinical Laboratory Improvement Amendments CLIA as qualified to perform high complexity clinical laboratory testing.    CEFEPIME  Value in next row Sensitive      2 SENSITIVEThis is a modified FDA-approved test that has been validated and its performance characteristics determined by the reporting laboratory.  This laboratory is certified under the Clinical Laboratory Improvement Amendments CLIA as qualified to perform high complexity clinical laboratory testing.    ERTAPENEM Value in next row Sensitive      2 SENSITIVEThis is a modified FDA-approved test that has been validated and its performance characteristics determined by the reporting laboratory.  This laboratory is certified under the Clinical Laboratory Improvement Amendments CLIA as qualified to perform high complexity clinical laboratory testing.    CEFTRIAXONE  Value in next row Sensitive      2 SENSITIVEThis is a modified FDA-approved test that has been validated and its performance characteristics determined by the reporting laboratory.  This laboratory is certified under the Clinical Laboratory Improvement Amendments CLIA as qualified to perform high complexity clinical laboratory testing.    CIPROFLOXACIN  Value in next row Sensitive      2 SENSITIVEThis is a modified FDA-approved test that has been validated and its performance characteristics determined by the reporting laboratory.  This laboratory is certified under the Clinical Laboratory Improvement Amendments CLIA as qualified to perform high complexity clinical laboratory testing.    GENTAMICIN Value in next row Sensitive      2 SENSITIVEThis is a modified FDA-approved test that has been validated and its performance characteristics determined by the reporting laboratory.  This laboratory is certified under the Clinical Laboratory Improvement Amendments CLIA as qualified to perform high complexity clinical  laboratory testing.    NITROFURANTOIN Value in  next row Intermediate      2 SENSITIVEThis is a modified FDA-approved test that has been validated and its performance characteristics determined by the reporting laboratory.  This laboratory is certified under the Clinical Laboratory Improvement Amendments CLIA as qualified to perform high complexity clinical laboratory testing.    TRIMETH/SULFA Value in next row Sensitive      2 SENSITIVEThis is a modified FDA-approved test that has been validated and its performance characteristics determined by the reporting laboratory.  This laboratory is certified under the Clinical Laboratory Improvement Amendments CLIA as qualified to perform high complexity clinical laboratory testing.    AMPICILLIN /SULBACTAM Value in next row Sensitive      2 SENSITIVEThis is a modified FDA-approved test that has been validated and its performance characteristics determined by the reporting laboratory.  This laboratory is certified under the Clinical Laboratory Improvement Amendments CLIA as qualified to perform high complexity clinical laboratory testing.    PIP/TAZO Value in next row Sensitive      <=4 SENSITIVEThis is a modified FDA-approved test that has been validated and its performance characteristics determined by the reporting laboratory.  This laboratory is certified under the Clinical Laboratory Improvement Amendments CLIA as qualified to perform high complexity clinical laboratory testing.    MEROPENEM Value in next row Sensitive      <=4 SENSITIVEThis is a modified FDA-approved test that has been validated and its performance characteristics determined by the reporting laboratory.  This laboratory is certified under the Clinical Laboratory Improvement Amendments CLIA as qualified to perform high complexity clinical laboratory testing.    * >=100,000 COLONIES/mL KLEBSIELLA PNEUMONIAE  Culture, blood (Routine X 2) w Reflex to ID Panel     Status: None (Preliminary  result)   Collection Time: 01/01/25  8:05 AM   Specimen: BLOOD RIGHT HAND  Result Value Ref Range Status   Specimen Description BLOOD RIGHT HAND  Final   Special Requests   Final    BOTTLES DRAWN AEROBIC AND ANAEROBIC Blood Culture adequate volume   Culture   Final    NO GROWTH 1 DAY Performed at [facility], [address] [place], [place] [address]    Report Status PENDING  Incomplete  Culture, blood (Routine X 2) w Reflex to ID Panel     Status: None (Preliminary result)   Collection Time: 01/01/25  8:12 AM   Specimen: BLOOD LEFT HAND  Result Value Ref Range Status   Specimen Description BLOOD LEFT HAND  Final   Special Requests   Final    BOTTLES DRAWN AEROBIC AND ANAEROBIC Blood Culture adequate volume   Culture   Final    NO GROWTH 1 DAY Performed at [facility], [address], [place], [place] [address]    Report Status PENDING  Incomplete         Radiology Studies: No results found.         LOS: 8 days   Time spent= 35 mins    Burgess JAYSON Dare, MD [facility]  If 7PM-7AM, please contact night-coverage  01/02/2025, 9:57 AM  "

## 2025-01-02 NOTE — Plan of Care (Signed)

## 2025-01-03 DIAGNOSIS — A419 Sepsis, unspecified organism: Secondary | ICD-10-CM | POA: Diagnosis not present

## 2025-01-03 DIAGNOSIS — R6521 Severe sepsis with septic shock: Secondary | ICD-10-CM | POA: Diagnosis not present

## 2025-01-03 DIAGNOSIS — R4182 Altered mental status, unspecified: Secondary | ICD-10-CM

## 2025-01-03 DIAGNOSIS — I959 Hypotension, unspecified: Secondary | ICD-10-CM

## 2025-01-03 LAB — BASIC METABOLIC PANEL WITH GFR
Anion gap: 11 (ref 5–15)
BUN: 42 mg/dL — ABNORMAL HIGH (ref 8–23)
CO2: 23 mmol/L (ref 22–32)
Calcium: 9.4 mg/dL (ref 8.9–10.3)
Chloride: 106 mmol/L (ref 98–111)
Creatinine, Ser: 2.72 mg/dL — ABNORMAL HIGH (ref 0.61–1.24)
GFR, Estimated: 23 mL/min — ABNORMAL LOW
Glucose, Bld: 104 mg/dL — ABNORMAL HIGH (ref 70–99)
Potassium: 4.4 mmol/L (ref 3.5–5.1)
Sodium: 140 mmol/L (ref 135–145)

## 2025-01-03 LAB — GLUCOSE, CAPILLARY
Glucose-Capillary: 102 mg/dL — ABNORMAL HIGH (ref 70–99)
Glucose-Capillary: 96 mg/dL (ref 70–99)
Glucose-Capillary: 156 mg/dL — ABNORMAL HIGH (ref 70–99)
Glucose-Capillary: 119 mg/dL — ABNORMAL HIGH (ref 70–99)
Glucose-Capillary: 135 mg/dL — ABNORMAL HIGH (ref 70–99)

## 2025-01-03 LAB — CBC
HCT: 36.1 % — ABNORMAL LOW (ref 39.0–52.0)
Hemoglobin: 11.7 g/dL — ABNORMAL LOW (ref 13.0–17.0)
MCH: 29 pg (ref 26.0–34.0)
MCHC: 32.4 g/dL (ref 30.0–36.0)
MCV: 89.4 fL (ref 80.0–100.0)
Platelets: 392 K/uL (ref 150–400)
RBC: 4.04 MIL/uL — ABNORMAL LOW (ref 4.22–5.81)
RDW: 15.1 % (ref 11.5–15.5)
WBC: 18.2 K/uL — ABNORMAL HIGH (ref 4.0–10.5)
nRBC: 0 % (ref 0.0–0.2)

## 2025-01-03 MED ORDER — SODIUM CHLORIDE 0.9 % IV SOLN
3.0000 g | Freq: Two times a day (BID) | INTRAVENOUS | Status: DC
  Administered 2025-01-03 – 2025-01-08 (×10): 3 g via INTRAVENOUS
  Filled 2025-01-03 (×10): qty 8

## 2025-01-03 MED ORDER — RISPERIDONE 0.5 MG PO TABS
0.5000 mg | ORAL_TABLET | Freq: Every day | ORAL | Status: DC
  Administered 2025-01-03 – 2025-01-09 (×7): 0.5 mg via ORAL
  Filled 2025-01-03 (×8): qty 1

## 2025-01-03 NOTE — TOC Progression Note (Signed)
 Transition of Care [facility]) - Progression Note    Patient Details  Name: Zachary [name] MRN: [id number] Date of Birth: 1946/01/31  [facility] CM/SW Contact  Luann SHAUNNA [place], [place] Phone Number: 01/03/2025, 1:07 PM  Clinical Narrative:     Received call from Chrystal with [employer] and informed that pt's SNF auth expires today. Pt not medically ready; will need to start a new snf auth closer to [place].   Informed that [facility] is still good Auth number [id number]  Expected Discharge Plan: Skilled Nursing Facility Barriers to Discharge: Continued Medical Work up, English As A Second Language Teacher               Expected Discharge Plan and Services In-house Referral: Clinical Social Work     Living arrangements for the past 2 months: Single Family Home, Skilled Nursing Facility                                       Social Drivers of Health (SDOH) Interventions SDOH Screenings   Food Insecurity: No Food Insecurity (12/29/2024)  Housing: Low Risk (12/29/2024)  Transportation Needs: No Transportation Needs (12/29/2024)  Utilities: Not At Risk (12/29/2024)  Depression (PHQ2-9): High Risk (07/26/2023)  Social Connections: Moderately Isolated (12/29/2024)  Tobacco Use: Medium Risk (12/25/2024)    Readmission Risk Interventions    12/10/2024    9:44 AM 02/11/2023   12:02 PM  Readmission Risk Prevention Plan  Medication Screening Complete   Transportation Screening Complete Complete  PCP or Specialist Appt within 5-7 Days  Complete  Home Care Screening  Complete  Medication Review (RN CM)  Complete

## 2025-01-03 NOTE — Plan of Care (Signed)

## 2025-01-03 NOTE — Plan of Care (Signed)

## 2025-01-03 NOTE — Progress Notes (Signed)
 Occupational Therapy Treatment Patient Details Name: Zachary [name] MRN: [id number] DOB: Oct 07, 1946 Today's Date: 01/03/2025   History of present illness Pt is 79 yo presenting to [facility] on 12/25/24 due to AMS, hypotension, tachycardia, and hypoxia, sent from rehab facility. Pt recently hospitalized for mechanical fall with resultant T9 fx s/p PCDF T7-11 on 12/13/24. PMHx: HLD, HTN, CKD, bladder CA, DM, OSA.   OT comments  Pt progressing towards goals. Session limited by pt's incontinence of stool. Rolled x2 to complete pericare at bed level with total +2 assistance. Transferred pt from bed to chair using maximove to promote upright posture, and adjust L lateral lean. Continue to recommend <3 hours of skilled rehab daily to optimize independence levels. Will continue to follow acutely.       If plan is discharge home, recommend the following:  Two people to help with walking and/or transfers;Two people to help with bathing/dressing/bathroom;Assistance with cooking/housework;Direct supervision/assist for medications management;Direct supervision/assist for financial management;Assist for transportation;Help with stairs or ramp for entrance;Supervision due to cognitive status   Equipment Recommendations  Other (comment) (Defer)       Precautions / Restrictions Precautions Precautions: Fall;Back Precaution Booklet Issued: No Recall of Precautions/Restrictions: Impaired Precaution/Restrictions Comments: TLSO not in hospital room Required Braces or Orthoses: Spinal Brace Spinal Brace: Thoracolumbosacral orthotic Restrictions Weight Bearing Restrictions Per Provider Order: No       Mobility Bed Mobility Overal bed mobility: Needs Assistance Bed Mobility: Rolling Rolling: Max assist, +2 for physical assistance         General bed mobility comments: Pt completed rolling to both sides x2 for pericare and placement of lift pad    Transfers Overall transfer level: Needs  assistance Equipment used: Ambulation equipment used Transfers: Bed to chair/wheelchair/BSC             General transfer comment: Use of maximove to transfer from bed to improve upright positioning for self feeding Transfer via Lift Equipment: Maximove   Balance Overall balance assessment: Needs assistance Sitting-balance support: Bilateral upper extremity supported, Feet supported Sitting balance-Leahy Scale: Poor Sitting balance - Comments: Requires back support for posterior and L lateral lean once seated in chair         ADL either performed or assessed with clinical judgement   ADL Overall ADL's : Needs assistance/impaired Eating/Feeding: Set up;Sitting Eating/Feeding Details (indicate cue type and reason): With back supported in chair       Toileting- Clothing Manipulation and Hygiene: Total assistance;Bed level;+2 for physical assistance;+2 for safety/equipment       Functional mobility during ADLs: Total assistance;+2 for physical assistance;+2 for safety/equipment General ADL Comments: Pt soiled in bed, incontinent of stool. Rolled in bed for pericare    Extremity/Trunk Assessment Upper Extremity Assessment Upper Extremity Assessment: Generalized weakness   Lower Extremity Assessment Lower Extremity Assessment: Defer to PT evaluation        Vision   Vision Assessment?: No apparent visual deficits         Communication Communication Communication: Impaired Factors Affecting Communication: Hearing impaired   Cognition Arousal: Alert Behavior During Therapy: Flat affect Cognition: History of cognitive impairments       OT - Cognition Comments: Pt with flat affect, asking questions regarding why he is in the hospital and how are his conditions being managed       Following commands: Impaired Following commands impaired: Follows one step commands inconsistently      Cueing   Cueing Techniques: Verbal cues, Visual cues, Tactile cues  General Comments VSS on RA    Pertinent Vitals/ Pain       Pain Assessment Pain Assessment: Faces Faces Pain Scale: Hurts little more Pain Location: back Pain Descriptors / Indicators: Discomfort, Grimacing Pain Intervention(s): Limited activity within patient's tolerance   Frequency  Min 1X/week        Progress Toward Goals  OT Goals(current goals can now be found in the care plan section)  Progress towards OT goals: Progressing toward goals  Acute Rehab OT Goals Patient Stated Goal: To eat OT Goal Formulation: With patient Time For Goal Achievement: 01/10/25 Potential to Achieve Goals: Good ADL Goals Pt Will Perform Grooming: with set-up;sitting Pt Will Perform Lower Body Bathing: with mod assist;sit to/from stand;with adaptive equipment Pt Will Perform Lower Body Dressing: with mod assist;with adaptive equipment;sitting/lateral leans;sit to/from stand Pt Will Transfer to Toilet: with min assist;with +2 assist;stand pivot transfer;bedside commode Pt Will Perform Toileting - Clothing Manipulation and hygiene: with min assist;sitting/lateral leans;sit to/from stand Additional ADL Goal #1: Pt will tolerate sitting EOB with no more than SBA in prepration for ADLs EOB/OOB.  Plan         AM-PAC OT 6 Clicks Daily Activity     Outcome Measure   Help from another person eating meals?: A Little Help from another person taking care of personal grooming?: A Little Help from another person toileting, which includes using toliet, bedpan, or urinal?: Total Help from another person bathing (including washing, rinsing, drying)?: A Lot Help from another person to put on and taking off regular upper body clothing?: A Lot Help from another person to put on and taking off regular lower body clothing?: Total 6 Click Score: 12    End of Session Equipment Utilized During Treatment: Other (comment) (Maximove)  OT Visit Diagnosis: Unsteadiness on feet (R26.81);Other abnormalities of  gait and mobility (R26.89);Pain Pain - part of body:  (back)   Activity Tolerance Patient tolerated treatment well   Patient Left in bed;with call bell/phone within reach;with chair alarm set   Nurse Communication Mobility status        Time: 8970-8886 OT Time Calculation (min): 44 min  Charges: OT General Charges $OT Visit: 1 Visit OT Treatments $Self Care/Home Management : 38-52 mins  Zachary [name], OT  Acute Rehabilitation Services Office [phone] Secure chat preferred   Zachary [name] Savers 01/03/2025, 1:47 PM

## 2025-01-03 NOTE — Progress Notes (Signed)
 " Daily Progress Note   Patient Name: Zachary [name]       Date: 01/03/2025 DOB: 12/25/1946  Age: 79 y.o. MRN#: [id number] Attending Physician: Caleen Burgess BROCKS, MD Primary Care Physician: Shona Norleen PEDLAR, MD Admit Date: 12/25/2024 Length of Stay: 9 days  Reason for Follow-up: Establishing goals of care  Subjective:   CC: Follow-up for continue conversation regarding goals of care.  Subjective: I met today with Mr Pooler after review of chart including prior notes from Dr. Caleen, prior notes from palliative care, notes from speech-language pathology, and note from urology.  Also reviewed notes from PT and OT as well as TOC.  Mr. Canupp is sitting in the bedside chair at time of my encounter.  He is awake and has lunch tray sitting in front of him, however, and is not touched.  He reports that nobody tells him what is going on and he has multiple questions about how to fix things.   We discussed clinical course during hospitalization with continued plan for antibiotics for shock secondary to urosepsis.  Reviewed other chronical medical problems and how they relate to this including his AKI, hydronephrosis, urinary retention, and prior bladder cancer.  He was not really able to fully follow conversation and repeated questions shortly after they were answered.  I called and spoke with his wife.  She and I discussed continued challenges she has faced, particularly in light of continued memory impairment.  We discussed that the hospital can be useful as long as he is getting well enough from care he receives at the hospital to enjoy his time outside of the hospital, but with his comorbidities and underlying concern for dementia, he is at high risk for further decline and complications.  I discussed with her that there is going to come a time in the future where, if his goal is to be out of the hospital, he may be better served to plan on being at home with the support of agency such as hospice rather than  repeated trips to the hospital.   She is in agreement that a good plan would be to plan to transition him back to rehab when he is ready for discharge from the hospital. He has done well with rehabbing in the past. If he does well and continues to thrive, I encouraged they continue with this plan. If, however he is unable to regain function and he continues to decline, I recommended that she speak with his PCP to determine if he may be better served by reevaluating goals of care with potential shift to a comfort focus for his care in the future  Review of Systems Denies pain, anxiety, shortness of breath, or nausea today.  Objective:   Vital Signs:  BP 114/74 (BP Location: Left Wrist)   Pulse 97   Temp 98.7 F (37.1 C) (Oral)   Resp 15   Ht 6' 1.5 (1.867 m)   Wt 82.3 kg   SpO2 96%   BMI 23.61 kg/m   Physical Exam: General: No distress.  Alert but no insight into his current condition. Eyes: conjunctiva clear, anicteric sclera HENT: normocephalic, atraumatic, moist mucous membranes Cardiovascular: RRR, no edema in LE b/l Respiratory: no increased work of breathing noted, not in respiratory distress Abdomen: not distended Skin: no rashes or lesions on visible skin Neuro: Alert oriented to self and knows he is in the hospital, follows commands, but seems to get a little agitated when asked to do so  Imaging: @IMAGES @  I personally reviewed recent imaging.   Assessment & Plan:   Assessment: 79 year old male with past history of hypertension, SLE, CKD stage IIIb, bladder cancer status post multiple surgeries, low-grade papillary urothelial carcinoma, urinary retention requiring catheter, nephrolithiasis, admitted from [facility] with altered mental status, hypotension, tachycardia and hypoxia secondary to septic shock from urosepsis.  Recommendations/Plan: # Complex medical decision making/goals of care:  - Patient with significant confusion noted in chart review.  Today he  was sitting up in bedside chair and had many questions regarding his care plan.  Reviewed his clinical course and care plan in detail per his request.  Unclear exactly how much of this information he retains, however.  - Discussed with his wife in detail via phone.  Discussed goal of getting him to skilled facility for continued rehab to see how much improvement he can make with eventual hope he will be able to return home.    - Discussed long-term care planning and natural trajectory of dementia including anticipated continued decline in his nutrition, cognition, and functional status as disease progresses.  Discussed continuing to evaluate long-term goals based upon his clinical course and continued changes in these domains.  -  Code Status: Limited: Do not attempt resuscitation (DNR) -DNR-LIMITED -Do Not Intubate/DNI   # Discharge Planning: Skilled Nursing Facility for rehab with Palliative care service follow-up  Discussed with: Patient, wife via phone  Thank you for allowing the palliative care team to participate in the care [employer].  Amaryllis Meissner, MD Palliative Care Provider PMT # [phone]  If patient remains symptomatic despite maximum doses, please call PMT at [phone] between 0700 and 1900. Outside of these hours, please call attending, as PMT does not have night coverage.   I personally spent a total of 40 minutes in the care of the patient today including preparing to see the patient, getting/reviewing separately obtained history, performing a medically appropriate exam/evaluation, documenting clinical information in the EHR, and communicating results.  "

## 2025-01-03 NOTE — Progress Notes (Signed)
 " PROGRESS NOTE    Zachary [name]  FMW:[id number] DOB: 1946-12-19 DOA: 12/25/2024 PCP: Shona Norleen PEDLAR, MD    Brief Narrative:  79 yoM with PMH as below significant for HTN, HLD, CKD 3b, bladder cancer s/p multiple surgeries and TURBT (12/2022), low-grade papillary urethral carcinoma, urinary retention requiring intermittent catheterizations 3-4x/ day, pre-DM, OSA, chronic pain, nephrolithiasis, anxiety, and depression.  Recent hospitalization for T9 vertebral fracture due to mechanical fall requiring decompression and fusion of thoracic spine discharged to rehab on 12/23.  Returns back to the hospital for altered mental status hypotension, tachycardia and hypoxia.  Admitted to the ICU for septic shock secondary to UTI.  Underwent cystoscopy on 12/29 with right-sided ureteral stent placement.  Weaned off pressors on 12/30.  Initial antibiotics IV vancomycin  and Rocephin  was switched to p.o. Augmentin .  Seen by neurosurgery due to recent spinal surgery on 12/17.  Hardware appears to be intact.  Assessment & Plan:  Septic shock secondary to urosepsis.  Pyonephrosis status post stent Leukocytosis - Urine cultures are growing E. coli, Klebsiella and Enterococcus faecalis.  Initially while in ICU antibiotics were narrowed to Augmentin  but started having worsening leukocytosis therefore antibiotics were broadened again to linezolid  and Zosyn , leukocytosis now improving.  CT abdomen pelvis and right upper quadrant ultrasound shows distended gallbladder, gallstones and some sludge but no evidence of acute cholecystitis.  LFTs are overall normal.  Monitor fever curve.  If you may need to consult ID  Chronic Foley catheter, replaced during this admission  Vertebral fracture, thoracic spine status post decompression/fusion Incisional drainage? - Recently performed on 12/17.  Routine pain control.  Seen by neurosurgery, Dr. Onetha.  Hardware appears to be intact. - MRI thoracic spine-shows lots of chronic  changes and postsurgical changes but no obvious evidence of infection noted  Metabolic encephalopathy - Secondary to underlying infection.  Also has delirium.  Supportive measures  AKI on CKD 3B Hydronephrosis Urinary retention with chronic Foley catheter History of bladder cancer requiring previous TURBT -Continue Foley catheter.  Urology team is following.  Ureteroscopy planned in 2-3 weeks.  Per their service, no voiding trial necessary due to chronic catheter - Baseline creatinine 2.0, peaked at 3.9 during this admission now stable around 2.7  Essential hypertension - Slowly resume home BP medications as he is able to tolerate.  IV as needed  Hyperlipidemia - Pravastatin   Major depressive disorder Delirium - Cymbalta .  Seen by psychiatry team.  As needed Ativan  and bedtime Remeron  has been added. - B12, TSH, ammonia, folate are normal  TeleSitter ordered for patient's safety  PT/OT Palliative care team assisting with goals of care discussions.  DVT prophylaxis: heparin  injection 5,000 Units Start: 12/25/24 2200 SCDs Start: 12/25/24 1759      Code Status: Limited: Do not attempt resuscitation (DNR) -DNR-LIMITED -Do Not Intubate/DNI  Family Communication:   Status is: Inpatient Remains inpatient appropriate because: Continue antibiotics.  Hopefully SNF in next 3-5 days   PT Follow up Recs: Skilled Nursing-Short Term Rehab (<3 Hours/Day)12/27/2024 1501  Subjective: Seen and bedside, resting comfortably.  No new complaints today  Examination:  General exam: Appears calm and comfortable  Respiratory system: Clear to auscultation. Respiratory effort normal. Cardiovascular system: S1 & S2 heard, RRR. No JVD, murmurs, rubs, gallops or clicks. No pedal edema. Gastrointestinal system: Abdomen is nondistended, soft and nontender. No organomegaly or masses felt. Normal bowel sounds heard. Central nervous system: Alert and oriented to name and place. No focal neurological  deficits. Extremities: Symmetric 5 x  5 power. Skin: No rashes, lesions or ulcers.  Concerns about lower back incisional drainage Psychiatry: Poor memory Foley catheter is in place           Wound 12/25/24 1714 Pressure Injury Heel Left Deep Tissue Pressure Injury - Purple or maroon localized area of discolored intact skin or blood-filled blister due to damage of underlying soft tissue from pressure and/or shear. (Active)     Diet Orders (From admission, onward)     Start     Ordered   01/01/25 1452  Diet regular Room service appropriate? Yes with Assist; Fluid consistency: Thin  Diet effective now       Question Answer Comment  Room service appropriate? Yes with Assist   Fluid consistency: Thin      01/01/25 1452            Objective: Vitals:   01/02/25 2352 01/03/25 0031 01/03/25 0605 01/03/25 0805  BP: (!) 117/52  116/69 132/68  Pulse: 97     Resp: 18 14 15 19   Temp: 98.1 F (36.7 C)  98.5 F (36.9 C) 98.6 F (37 C)  TempSrc: Oral  Oral Oral  SpO2: 95%   95%  Weight:      Height:        Intake/Output Summary (Last 24 hours) at 01/03/2025 1050 Last data filed at 01/03/2025 9392 Gross per 24 hour  Intake 681.17 ml  Output 1300 ml  Net -618.83 ml   Filed Weights   12/31/24 0418 01/01/25 0429 01/02/25 0605  Weight: 83.6 kg 83.7 kg 82.3 kg    Scheduled Meds:  carvedilol   6.25 mg Oral BID   Chlorhexidine  Gluconate Cloth  6 each Topical Daily   dorzolamide -timolol   1 drop Both Eyes BID   DULoxetine   60 mg Oral BID   feeding supplement  237 mL Oral TID BM   heparin   5,000 Units Subcutaneous Q8H   latanoprost   1 drop Both Eyes QHS   lidocaine   1 patch Transdermal Q24H   linezolid   600 mg Oral Q12H   melatonin  3 mg Oral QHS   mirtazapine   7.5 mg Oral QHS   multivitamin with minerals  1 tablet Oral Daily   nystatin   5 mL Oral QID   polyethylene glycol  17 g Oral Daily   pravastatin   10 mg Oral QHS   Continuous Infusions:  piperacillin -tazobactam  (ZOSYN )  IV 3.375 g (01/03/25 0618)    Nutritional status Signs/Symptoms: moderate muscle depletion, percent weight loss (11% weight loss within 1 month) Percent weight loss: 11 % Interventions: Ensure Enlive (each supplement provides 350kcal and 20 grams of protein), Liberalize Diet Body mass index is 23.61 kg/m.  Data Reviewed:   CBC: Recent Labs  Lab 12/30/24 1004 12/31/24 0133 01/01/25 0138 01/02/25 0235 01/03/25 0228  WBC 26.2* 30.3* 32.3* 22.3* 18.2*  HGB 10.7* 11.7* 10.8* 10.4* 11.7*  HCT 33.8* 37.2* 34.5* 32.9* 36.1*  MCV 90.1 91.4 89.1 89.9 89.4  PLT 264 300 315 321 392   Basic Metabolic Panel: Recent Labs  Lab 12/27/24 1127 12/28/24 0259 12/29/24 0159 12/30/24 0156 12/31/24 0133 01/01/25 0138 01/02/25 0235 01/03/25 0228  NA 142 143 143 145 143 141 141 140  K 4.5 4.7 4.8 4.9 5.0 4.6 4.4 4.4  CL 108 109 111 113* 110 109 107 106  CO2 20* 21* 22 22 23 24 24 23   GLUCOSE 126* 98 98 95 96 115* 131* 104*  BUN 91* 88* 73* 55* 50* 49* 47* 42*  CREATININE 3.09* 2.83* 2.48* 2.21* 2.19* 2.45* 2.63* 2.72*  CALCIUM 8.3* 8.8* 8.4* 8.4* 8.5* 8.3* 8.3* 9.4  MG 2.2 2.1 1.9 1.8 1.9 1.9  --   --   PHOS 3.9  --   --   --   --   --   --   --    GFR: Estimated Creatinine Clearance: 25.7 mL/min (A) (by C-G formula based on SCr of 2.72 mg/dL (H)). Liver Function Tests: Recent Labs  Lab 12/30/24 1141  AST 22  ALT 22  ALKPHOS 147*  BILITOT 0.3  PROT 5.9*  ALBUMIN  2.6*   No results for input(s): LIPASE, AMYLASE in the last 168 hours. Recent Labs  Lab 12/30/24 1004  AMMONIA 29   Coagulation Profile: No results for input(s): INR, PROTIME in the last 168 hours. Cardiac Enzymes: No results for input(s): CKTOTAL, CKMB, CKMBINDEX, TROPONINI in the last 168 hours. BNP (last 3 results) Recent Labs    12/25/24 0922  PROBNP 2,105.0*   HbA1C: No results for input(s): HGBA1C in the last 72 hours. CBG: Recent Labs  Lab 01/02/25 1616 01/02/25 2018  01/02/25 2354 01/03/25 0601 01/03/25 0803  GLUCAP 94 137* 115* 102* 96   Lipid Profile: No results for input(s): CHOL, HDL, LDLCALC, TRIG, CHOLHDL, LDLDIRECT in the last 72 hours. Thyroid  Function Tests: No results for input(s): TSH, T4TOTAL, FREET4, T3FREE, THYROIDAB in the last 72 hours. Anemia Panel: No results for input(s): VITAMINB12, FOLATE, FERRITIN, TIBC, IRON, RETICCTPCT in the last 72 hours. Sepsis Labs: No results for input(s): PROCALCITON, LATICACIDVEN in the last 168 hours.  Recent Results (from the past 240 hours)  Blood Culture (routine x 2)     Status: None   Collection Time: 12/25/24  9:22 AM   Specimen: BLOOD  Result Value Ref Range Status   Specimen Description BLOOD LEFT ARM  Final   Special Requests   Final    BOTTLES DRAWN AEROBIC AND ANAEROBIC Blood Culture adequate volume   Culture   Final    NO GROWTH 5 DAYS Performed at [facility], [address] [place], [place] [address]    Report Status 12/30/2024 FINAL  Final  Blood Culture (routine x 2)     Status: None   Collection Time: 12/25/24 10:08 AM   Specimen: BLOOD  Result Value Ref Range Status   Specimen Description BLOOD BLOOD RIGHT ARM  Final   Special Requests   Final    BOTTLES DRAWN AEROBIC AND ANAEROBIC Blood Culture adequate volume   Culture   Final    NO GROWTH 5 DAYS Performed at [facility], [address] [place], [place] [address]    Report Status 12/30/2024 FINAL  Final  Urine Culture     Status: Abnormal   Collection Time: 12/25/24  3:27 PM   Specimen: Urine, Catheterized  Result Value Ref Range Status   Specimen Description   Final    URINE, CATHETERIZED Performed at [facility], [address], [place], [place] [address]    Special Requests   Final    NONE Performed at [facility], [address] [place], [place] [address]    Culture (A)  Final    60,000 COLONIES/mL ESCHERICHIA COLI >=100,000 COLONIES/mL ENTEROCOCCUS FAECALIS     Report Status 12/27/2024 FINAL  Final   Organism ID, Bacteria ESCHERICHIA COLI (A)  Final   Organism ID, Bacteria ENTEROCOCCUS FAECALIS (A)  Final      Susceptibility   Escherichia coli - MIC*    AMPICILLIN  8  SENSITIVE Sensitive     CEFAZOLIN  (URINE) Value in next row Sensitive      2 SENSITIVEThis is a modified FDA-approved test that has been validated and its performance characteristics determined by the reporting laboratory.  This laboratory is certified under the Clinical Laboratory Improvement Amendments CLIA as qualified to perform high complexity clinical laboratory testing.    CEFEPIME  Value in next row Sensitive      2 SENSITIVEThis is a modified FDA-approved test that has been validated and its performance characteristics determined by the reporting laboratory.  This laboratory is certified under the Clinical Laboratory Improvement Amendments CLIA as qualified to perform high complexity clinical laboratory testing.    ERTAPENEM Value in next row Sensitive      2 SENSITIVEThis is a modified FDA-approved test that has been validated and its performance characteristics determined by the reporting laboratory.  This laboratory is certified under the Clinical Laboratory Improvement Amendments CLIA as qualified to perform high complexity clinical laboratory testing.    CEFTRIAXONE  Value in next row Sensitive      2 SENSITIVEThis is a modified FDA-approved test that has been validated and its performance characteristics determined by the reporting laboratory.  This laboratory is certified under the Clinical Laboratory Improvement Amendments CLIA as qualified to perform high complexity clinical laboratory testing.    CIPROFLOXACIN  Value in next row Sensitive      2 SENSITIVEThis is a modified FDA-approved test that has been validated and its performance characteristics determined by the reporting laboratory.  This laboratory is certified under the Clinical Laboratory Improvement Amendments CLIA as  qualified to perform high complexity clinical laboratory testing.    GENTAMICIN Value in next row Sensitive      2 SENSITIVEThis is a modified FDA-approved test that has been validated and its performance characteristics determined by the reporting laboratory.  This laboratory is certified under the Clinical Laboratory Improvement Amendments CLIA as qualified to perform high complexity clinical laboratory testing.    NITROFURANTOIN Value in next row Sensitive      2 SENSITIVEThis is a modified FDA-approved test that has been validated and its performance characteristics determined by the reporting laboratory.  This laboratory is certified under the Clinical Laboratory Improvement Amendments CLIA as qualified to perform high complexity clinical laboratory testing.    TRIMETH/SULFA Value in next row Sensitive      2 SENSITIVEThis is a modified FDA-approved test that has been validated and its performance characteristics determined by the reporting laboratory.  This laboratory is certified under the Clinical Laboratory Improvement Amendments CLIA as qualified to perform high complexity clinical laboratory testing.    AMPICILLIN /SULBACTAM Value in next row Sensitive      2 SENSITIVEThis is a modified FDA-approved test that has been validated and its performance characteristics determined by the reporting laboratory.  This laboratory is certified under the Clinical Laboratory Improvement Amendments CLIA as qualified to perform high complexity clinical laboratory testing.    PIP/TAZO Value in next row Sensitive      <=4 SENSITIVEThis is a modified FDA-approved test that has been validated and its performance characteristics determined by the reporting laboratory.  This laboratory is certified under the Clinical Laboratory Improvement Amendments CLIA as qualified to perform high complexity clinical laboratory testing.    MEROPENEM Value in next row Sensitive      <=4 SENSITIVEThis is a modified FDA-approved  test that has been validated and its performance characteristics determined by the reporting laboratory.  This laboratory is certified under the Clinical Laboratory Improvement  Amendments CLIA as qualified to perform high complexity clinical laboratory testing.    * 60,000 COLONIES/mL ESCHERICHIA COLI   Enterococcus faecalis - MIC*    AMPICILLIN  Value in next row Sensitive      <=4 SENSITIVEThis is a modified FDA-approved test that has been validated and its performance characteristics determined by the reporting laboratory.  This laboratory is certified under the Clinical Laboratory Improvement Amendments CLIA as qualified to perform high complexity clinical laboratory testing.    NITROFURANTOIN Value in next row Sensitive      <=4 SENSITIVEThis is a modified FDA-approved test that has been validated and its performance characteristics determined by the reporting laboratory.  This laboratory is certified under the Clinical Laboratory Improvement Amendments CLIA as qualified to perform high complexity clinical laboratory testing.    VANCOMYCIN  Value in next row Sensitive      <=4 SENSITIVEThis is a modified FDA-approved test that has been validated and its performance characteristics determined by the reporting laboratory.  This laboratory is certified under the Clinical Laboratory Improvement Amendments CLIA as qualified to perform high complexity clinical laboratory testing.    * >=100,000 COLONIES/mL ENTEROCOCCUS FAECALIS  Urine Culture     Status: Abnormal   Collection Time: 12/25/24  9:01 PM   Specimen: Kidney, Right; Urine  Result Value Ref Range Status   Specimen Description URINE, RANDOM  Final   Special Requests RIGHT RENAL PELVIS SPEC A  Final   Culture (A)  Final    >=100,000 COLONIES/mL KLEBSIELLA PNEUMONIAE 30,000 COLONIES/mL ENTEROCOCCUS RAFFINOSUS Standardized susceptibility testing for this organism is not available. Performed at [facility], [address]  [place], [place] [address]    Report Status 12/29/2024 FINAL  Final   Organism ID, Bacteria KLEBSIELLA PNEUMONIAE (A)  Final      Susceptibility   Klebsiella pneumoniae - MIC*    AMPICILLIN  RESISTANT Resistant     CEFAZOLIN  (URINE) Value in next row Sensitive      2 SENSITIVEThis is a modified FDA-approved test that has been validated and its performance characteristics determined by the reporting laboratory.  This laboratory is certified under the Clinical Laboratory Improvement Amendments CLIA as qualified to perform high complexity clinical laboratory testing.    CEFEPIME  Value in next row Sensitive      2 SENSITIVEThis is a modified FDA-approved test that has been validated and its performance characteristics determined by the reporting laboratory.  This laboratory is certified under the Clinical Laboratory Improvement Amendments CLIA as qualified to perform high complexity clinical laboratory testing.    ERTAPENEM Value in next row Sensitive      2 SENSITIVEThis is a modified FDA-approved test that has been validated and its performance characteristics determined by the reporting laboratory.  This laboratory is certified under the Clinical Laboratory Improvement Amendments CLIA as qualified to perform high complexity clinical laboratory testing.    CEFTRIAXONE  Value in next row Sensitive      2 SENSITIVEThis is a modified FDA-approved test that has been validated and its performance characteristics determined by the reporting laboratory.  This laboratory is certified under the Clinical Laboratory Improvement Amendments CLIA as qualified to perform high complexity clinical laboratory testing.    CIPROFLOXACIN  Value in next row Sensitive      2 SENSITIVEThis is a modified FDA-approved test that has been validated and its performance characteristics determined by the reporting laboratory.  This laboratory is certified under the Clinical Laboratory Improvement Amendments CLIA as qualified to perform high  complexity clinical laboratory  testing.    GENTAMICIN Value in next row Sensitive      2 SENSITIVEThis is a modified FDA-approved test that has been validated and its performance characteristics determined by the reporting laboratory.  This laboratory is certified under the Clinical Laboratory Improvement Amendments CLIA as qualified to perform high complexity clinical laboratory testing.    NITROFURANTOIN Value in next row Intermediate      2 SENSITIVEThis is a modified FDA-approved test that has been validated and its performance characteristics determined by the reporting laboratory.  This laboratory is certified under the Clinical Laboratory Improvement Amendments CLIA as qualified to perform high complexity clinical laboratory testing.    TRIMETH/SULFA Value in next row Sensitive      2 SENSITIVEThis is a modified FDA-approved test that has been validated and its performance characteristics determined by the reporting laboratory.  This laboratory is certified under the Clinical Laboratory Improvement Amendments CLIA as qualified to perform high complexity clinical laboratory testing.    AMPICILLIN /SULBACTAM Value in next row Sensitive      2 SENSITIVEThis is a modified FDA-approved test that has been validated and its performance characteristics determined by the reporting laboratory.  This laboratory is certified under the Clinical Laboratory Improvement Amendments CLIA as qualified to perform high complexity clinical laboratory testing.    PIP/TAZO Value in next row Sensitive      <=4 SENSITIVEThis is a modified FDA-approved test that has been validated and its performance characteristics determined by the reporting laboratory.  This laboratory is certified under the Clinical Laboratory Improvement Amendments CLIA as qualified to perform high complexity clinical laboratory testing.    MEROPENEM Value in next row Sensitive      <=4 SENSITIVEThis is a modified FDA-approved test that has been  validated and its performance characteristics determined by the reporting laboratory.  This laboratory is certified under the Clinical Laboratory Improvement Amendments CLIA as qualified to perform high complexity clinical laboratory testing.    * >=100,000 COLONIES/mL KLEBSIELLA PNEUMONIAE  Culture, blood (Routine X 2) w Reflex to ID Panel     Status: None (Preliminary result)   Collection Time: 01/01/25  8:05 AM   Specimen: BLOOD RIGHT HAND  Result Value Ref Range Status   Specimen Description BLOOD RIGHT HAND  Final   Special Requests   Final    BOTTLES DRAWN AEROBIC AND ANAEROBIC Blood Culture adequate volume   Culture   Final    NO GROWTH 2 DAYS Performed at [facility], [address], [place], [place] [address]    Report Status PENDING  Incomplete  Culture, blood (Routine X 2) w Reflex to ID Panel     Status: None (Preliminary result)   Collection Time: 01/01/25  8:12 AM   Specimen: BLOOD LEFT HAND  Result Value Ref Range Status   Specimen Description BLOOD LEFT HAND  Final   Special Requests   Final    BOTTLES DRAWN AEROBIC AND ANAEROBIC Blood Culture adequate volume   Culture   Final    NO GROWTH 2 DAYS Performed at [facility], [address] [place], [place] [address]    Report Status PENDING  Incomplete         Radiology Studies: MR THORACIC SPINE W WO CONTRAST Result Date: 01/02/2025 EXAM: MR Thoracic Spine without 12/17/last year TECHNIQUE: Multiplanar multisequence MRI of the thoracic spine was performed without the administration of intravenous contrast. COMPARISON: CT chest, abdomen, and pelvis 12/29/last year, preoperative thoracic spine MRI 12/15/last year. CLINICAL HISTORY:  79 year old male, postoperative T7-T11 posterior fixation for T9 Chance fracture in ankylosed spine. Query infection. FINDINGS: Exam was discontinued prior to completion as the patient had difficulty tolerating. Sagittal precontrast and T2 weighted only axial images are provided.  Normal thoracic spine segmentation on the comparison CT. BONES AND ALIGNMENT: Hardware susceptibility artifact at the T7 through T11 levels as expected. Underlying hyperostosis related thoracic spinal ankylosis. Unhealed Chance type fracture at T9. Associated fluid, which might be seroma, in the fracture cleft (series 9 images 11 through 14). Confluent superimposed vertebral body edema at T8 and T9. Stable vertebral height from the recent CT. Normal background bone marrow signal. No other marrow edema when allowing for hardware susceptibility. Incidental L2 benign vertebral hemangioma. SPINAL CORD: Normal spinal cord volume. No thoracic spinal cord signal abnormality identified. The conus medullaris appears normal at L1. SOFT TISSUES: Superimposed dependent bilateral pleural fluid and/or pulmonary atelectasis or consolidation. No convincing paraspinal phlegmon or inflammation at that level. 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 T7-T8: Posterior spinal fusion hardware. Similar epidural lipomatosis and degenerative posterior element hypertrophy. T9-T10: Posterior decompression of this level. Laminectomy fluid collection measuring about 23 x 23 x 24 mm in certain dimensions in certain volumes is favored to be postoperative seroma, with only minimal mass effect on the posterior thecal sac (series 10 image 30). T9-T10 posterior spinal hardware. Epidural lipomatosis and degenerative posterior element hypertrophy similar to other levels. T10 through thoracolumbar junction: Interbody ankylosis by CT. The dorsal epidural lipomatosis and posterior element hypertrophy abates at T11-T12. IMPRESSION: 1. Truncated exam; difficult for patient to tolerate. Non-contrast images only and only T2 weighted axial images. 2. Posterior  spinal fusion and Unhealed chance-type fracture at T9 in the otherwise ankylosed spine. No adverse features identified; favor small volume seroma in both the fracture cleft and the laminectomy space (6 mL). No convincing spinal infection based on these images. 3. Dependent bilateral pleural fluid, atelectasis and/or consolidation. Electronically signed by: Helayne Hurst MD 01/02/2025 11:23 AM EST RP Workstation: [id number]           LOS: 9 days   Time spent= 35 mins    Burgess JAYSON Dare, MD [facility]  If 7PM-7AM, please contact night-coverage  01/03/2025, 10:50 AM  "

## 2025-01-03 NOTE — Progress Notes (Signed)
 Subjective: Patient reports patient remains somewhat confused encephalopathic but without complaints except for mild to moderate back pain that he says is improving.  Objective: Vital signs in last 24 hours: Temp:  [97.8 F (36.6 C)-98.7 F (37.1 C)] 98.7 F (37.1 C) (01/07 1141) Pulse Rate:  [84-97] 97 (01/06 2352) Resp:  [14-20] 15 (01/07 1141) BP: (114-132)/(52-74) 114/74 (01/07 1141) SpO2:  [95 %-96 %] 96 % (01/07 1141) FiO2 (%):  [21 %] 21 % (01/07 0031)  Intake/Output from previous day: 01/06 0701 - 01/07 0700 In: 681.2 [P.O.:360; IV Piggyback:321.2] Out: 1300 [Urine:1300] Intake/Output this shift: No intake/output data recorded.  Awake alert oriented but somewhat encephalopathic.  Distal lower extremity strength is 5 out of 5 with some proximal left leg weakness at baseline.  Incision inspected bandage in place but folded back seems to be clean dry and intact with minimal drainage  Lab Results: Recent Labs    01/02/25 0235 01/03/25 0228  WBC 22.3* 18.2*  HGB 10.4* 11.7*  HCT 32.9* 36.1*  PLT 321 392   BMET Recent Labs    01/02/25 0235 01/03/25 0228  NA 141 140  K 4.4 4.4  CL 107 106  CO2 24 23  GLUCOSE 131* 104*  BUN 47* 42*  CREATININE 2.63* 2.72*  CALCIUM 8.3* 9.4    Studies/Results: MR THORACIC SPINE W WO CONTRAST Result Date: 01/02/2025 EXAM: MR Thoracic Spine without 12/17/last year TECHNIQUE: Multiplanar multisequence MRI of the thoracic spine was performed without the administration of intravenous contrast. COMPARISON: CT chest, abdomen, and pelvis 12/29/last year, preoperative thoracic spine MRI 12/15/last year. CLINICAL HISTORY: 79 year old male, postoperative T7-T11 posterior fixation for T9 Chance fracture in ankylosed spine. Query infection. FINDINGS: Exam was discontinued prior to completion as the patient had difficulty tolerating. Sagittal precontrast and T2 weighted only axial images are provided. Normal thoracic spine segmentation on the  comparison CT. BONES AND ALIGNMENT: Hardware susceptibility artifact at the T7 through T11 levels as expected. Underlying hyperostosis related thoracic spinal ankylosis. Unhealed Chance type fracture at T9. Associated fluid, which might be seroma, in the fracture cleft (series 9 images 11 through 14). Confluent superimposed vertebral body edema at T8 and T9. Stable vertebral height from the recent CT. Normal background bone marrow signal. No other marrow edema when allowing for hardware susceptibility. Incidental L2 benign vertebral hemangioma. SPINAL CORD: Normal spinal cord volume. No thoracic spinal cord signal abnormality identified. The conus medullaris appears normal at L1. SOFT TISSUES: Superimposed dependent bilateral pleural fluid and/or pulmonary atelectasis or consolidation. No convincing paraspinal phlegmon or inflammation at that level. 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 T7-T8: Posterior spinal fusion hardware. Similar epidural lipomatosis and degenerative posterior element hypertrophy. T9-T10: Posterior decompression of this level. Laminectomy fluid collection measuring about 23 x 23 x 24 mm in certain dimensions in certain volumes is favored to be postoperative seroma, with only minimal mass effect on the posterior thecal sac (series 10 image 30). T9-T10 posterior spinal hardware. Epidural lipomatosis and degenerative posterior element hypertrophy similar to other levels. T10 through thoracolumbar junction: Interbody ankylosis by CT. The dorsal epidural lipomatosis and posterior element hypertrophy abates at T11-T12. IMPRESSION: 1. Truncated exam; difficult for patient to tolerate. Non-contrast images only and only T2 weighted axial images. 2. Posterior spinal fusion and Unhealed chance-type  fracture at T9 in the  otherwise ankylosed spine. No adverse features identified; favor small volume seroma in both the fracture cleft and the laminectomy space (6 mL). No convincing spinal infection based on these images. 3. Dependent bilateral pleural fluid, atelectasis and/or consolidation. Electronically signed by: Helayne Hurst MD 01/02/2025 11:23 AM EST RP Workstation: [id number]    Assessment/Plan: Continue with physical occupational therapy patient will need inpatient rehab or skilled nursing facility which appears to be in process.  Thoracic MRI reviewed as well as recent abdominal CT do not see any evidence of infection or significant spinal cord compression, well decompressed spinal canal with normal postoperative fluid collection.  No new neurosurgical recommendations at this time  LOS: 9 days     Zachary [name] 01/03/2025, 2:39 PM

## 2025-01-04 DIAGNOSIS — R6521 Severe sepsis with septic shock: Secondary | ICD-10-CM | POA: Diagnosis not present

## 2025-01-04 DIAGNOSIS — A419 Sepsis, unspecified organism: Secondary | ICD-10-CM | POA: Diagnosis not present

## 2025-01-04 LAB — GLUCOSE, CAPILLARY
Glucose-Capillary: 103 mg/dL — ABNORMAL HIGH (ref 70–99)
Glucose-Capillary: 78 mg/dL (ref 70–99)
Glucose-Capillary: 94 mg/dL (ref 70–99)
Glucose-Capillary: 117 mg/dL — ABNORMAL HIGH (ref 70–99)
Glucose-Capillary: 118 mg/dL — ABNORMAL HIGH (ref 70–99)
Glucose-Capillary: 96 mg/dL (ref 70–99)

## 2025-01-04 LAB — BASIC METABOLIC PANEL WITH GFR
Anion gap: 9 (ref 5–15)
BUN: 38 mg/dL — ABNORMAL HIGH (ref 8–23)
CO2: 22 mmol/L (ref 22–32)
Calcium: 8.5 mg/dL — ABNORMAL LOW (ref 8.9–10.3)
Chloride: 106 mmol/L (ref 98–111)
Creatinine, Ser: 2.53 mg/dL — ABNORMAL HIGH (ref 0.61–1.24)
GFR, Estimated: 25 mL/min — ABNORMAL LOW
Glucose, Bld: 100 mg/dL — ABNORMAL HIGH (ref 70–99)
Potassium: 4.4 mmol/L (ref 3.5–5.1)
Sodium: 138 mmol/L (ref 135–145)

## 2025-01-04 LAB — CBC
HCT: 33.5 % — ABNORMAL LOW (ref 39.0–52.0)
Hemoglobin: 10.4 g/dL — ABNORMAL LOW (ref 13.0–17.0)
MCH: 28.3 pg (ref 26.0–34.0)
MCHC: 31 g/dL (ref 30.0–36.0)
MCV: 91.3 fL (ref 80.0–100.0)
Platelets: 380 K/uL (ref 150–400)
RBC: 3.67 MIL/uL — ABNORMAL LOW (ref 4.22–5.81)
RDW: 15.2 % (ref 11.5–15.5)
WBC: 15.2 K/uL — ABNORMAL HIGH (ref 4.0–10.5)
nRBC: 0 % (ref 0.0–0.2)

## 2025-01-04 MED ORDER — LORAZEPAM 0.5 MG PO TABS
0.5000 mg | ORAL_TABLET | Freq: Every day | ORAL | Status: DC | PRN
  Administered 2025-01-04 – 2025-01-09 (×3): 0.5 mg via ORAL
  Filled 2025-01-04 (×4): qty 1

## 2025-01-04 MED FILL — Ondansetron HCl Inj 4 MG/2ML (2 MG/ML): INTRAMUSCULAR | Qty: 2 | Status: AC

## 2025-01-04 MED FILL — Fentanyl Citrate Preservative Free (PF) Inj 100 MCG/2ML: INTRAMUSCULAR | Qty: 2 | Status: AC

## 2025-01-04 NOTE — Plan of Care (Signed)
  Problem: Activity: Goal: Risk for activity intolerance will decrease Outcome: Progressing   Problem: Nutrition: Goal: Adequate nutrition will be maintained Outcome: Progressing   Problem: Coping: Goal: Level of anxiety will decrease Outcome: Progressing   Problem: Pain Managment: Goal: General experience of comfort will improve and/or be controlled Outcome: Progressing

## 2025-01-04 NOTE — Progress Notes (Signed)
 " PROGRESS NOTE    Zachary [name]  FMW:[id number] DOB: 12-15-1946 DOA: 12/25/2024 PCP: Shona Norleen PEDLAR, MD    Brief Narrative:  79 yoM with PMH as below significant for HTN, HLD, CKD 3b, bladder cancer s/p multiple surgeries and TURBT (12/2022), low-grade papillary urethral carcinoma, urinary retention requiring intermittent catheterizations 3-4x/ day, pre-DM, OSA, chronic pain, nephrolithiasis, anxiety, and depression.  Recent hospitalization for T9 vertebral fracture due to mechanical fall requiring decompression and fusion of thoracic spine discharged to rehab on 12/23.  Returns back to the hospital for altered mental status hypotension, tachycardia and hypoxia.  Admitted to the ICU for septic shock secondary to UTI.  Underwent cystoscopy on 12/29 with right-sided ureteral stent placement.  Weaned off pressors on 12/30.  Initial antibiotics IV vancomycin  and Rocephin  was switched to p.o. Augmentin .  Seen by neurosurgery due to recent spinal surgery on 12/17.  Hardware appears to be intact.  MRI showing chronic changes along with postsurgical changes but no evidence of infection.  Hopefully plan for SNF in next 2-4 days  Assessment & Plan:  Septic shock secondary to urosepsis.  Pyonephrosis status post stent Leukocytosis - Urine cultures are growing E. coli, Klebsiella and Enterococcus faecalis.  Initially while in ICU antibiotics were narrowed to Augmentin  but due to worsening leukocytosis antibiotics were broadened to linezolid  and Zosyn .  Now transition to linezolid  and Unasyn . Eventual PO Regimen Linezolid /Augmentin .   CT abdomen pelvis and right upper quadrant ultrasound shows distended gallbladder, gallstones and some sludge but no evidence of acute cholecystitis.  LFTs are overall normal.  Monitor fever curve.  If you may need to consult ID  Chronic Foley catheter, replaced during this admission  Vertebral fracture, thoracic spine status post decompression/fusion Incisional drainage? -  Recently performed on 12/17.  Routine pain control.  Seen by neurosurgery, Dr. Onetha.  Hardware appears to be intact. - MRI thoracic spine-shows lots of chronic changes and postsurgical changes but no obvious evidence of infection noted  Metabolic encephalopathy - Secondary to underlying infection.  Also has delirium.  Supportive measures  AKI on CKD 3B Hydronephrosis Urinary retention with chronic Foley catheter History of bladder cancer requiring previous TURBT -Continue Foley catheter.  Urology team is following.  Ureteroscopy planned in 2-3 weeks.  Per their service, no voiding trial necessary due to chronic catheter - Baseline creatinine 2.0, peaked at 3.9 during this admission now stable around 2.5  Essential hypertension - Slowly resume home BP medications as he is able to tolerate.  IV as needed  Hyperlipidemia - Pravastatin   Major depressive disorder Delirium - Cymbalta .  Seen by psychiatry team.  As needed Ativan  and bedtime Remeron  has been added. - B12, TSH, ammonia, folate are normal   PT/OT Palliative care team assisting with goals of care discussions.  DVT prophylaxis: heparin  injection 5,000 Units Start: 12/25/24 2200 SCDs Start: 12/25/24 1759      Code Status: Limited: Do not attempt resuscitation (DNR) -DNR-LIMITED -Do Not Intubate/DNI  Family Communication:   Status is: Inpatient Remains inpatient appropriate because: Continue antibiotics.  Hopefully SNF in next 2-4 days   PT Follow up Recs: Skilled Nursing-Short Term Rehab (<3 Hours/Day)12/27/2024 1501  Subjective: Feels a little better, no new complaints  Examination:  General exam: Appears calm and comfortable  Respiratory system: Clear to auscultation. Respiratory effort normal. Cardiovascular system: S1 & S2 heard, RRR. No JVD, murmurs, rubs, gallops or clicks. No pedal edema. Gastrointestinal system: Abdomen is nondistended, soft and nontender. No organomegaly or masses felt. Normal  bowel sounds  heard. Central nervous system: Alert and oriented to name and place. No focal neurological deficits. Extremities: Symmetric 5 x 5 power. Skin: No rashes, lesions or ulcers.  Concerns about lower back incisional drainage Psychiatry: Poor memory Foley catheter is in place           Wound 12/25/24 1714 Pressure Injury Heel Left Deep Tissue Pressure Injury - Purple or maroon localized area of discolored intact skin or blood-filled blister due to damage of underlying soft tissue from pressure and/or shear. (Active)     Diet Orders (From admission, onward)     Start     Ordered   01/01/25 1452  Diet regular Room service appropriate? Yes with Assist; Fluid consistency: Thin  Diet effective now       Question Answer Comment  Room service appropriate? Yes with Assist   Fluid consistency: Thin      01/01/25 1452            Objective: Vitals:   01/03/25 1930 01/03/25 2326 01/04/25 0415 01/04/25 0809  BP:  (!) 101/58 133/77 137/81  Pulse:  87 89 85  Resp: 20 17 15 16   Temp:  98.7 F (37.1 C) 97.7 F (36.5 C) 97.6 F (36.4 C)  TempSrc:  Oral Oral Oral  SpO2:  94% 93% 95%  Weight:   82.8 kg   Height:        Intake/Output Summary (Last 24 hours) at 01/04/2025 1120 Last data filed at 01/04/2025 0900 Gross per 24 hour  Intake 1119.36 ml  Output 850 ml  Net 269.36 ml   Filed Weights   01/01/25 0429 01/02/25 0605 01/04/25 0415  Weight: 83.7 kg 82.3 kg 82.8 kg    Scheduled Meds:  carvedilol   6.25 mg Oral BID   Chlorhexidine  Gluconate Cloth  6 each Topical Daily   dorzolamide -timolol   1 drop Both Eyes BID   DULoxetine   60 mg Oral BID   feeding supplement  237 mL Oral TID BM   heparin   5,000 Units Subcutaneous Q8H   latanoprost   1 drop Both Eyes QHS   lidocaine   1 patch Transdermal Q24H   linezolid   600 mg Oral Q12H   melatonin  3 mg Oral QHS   mirtazapine   7.5 mg Oral QHS   multivitamin with minerals  1 tablet Oral Daily   nystatin   5 mL Oral QID   polyethylene  glycol  17 g Oral Daily   pravastatin   10 mg Oral QHS   risperiDONE   0.5 mg Oral QHS   Continuous Infusions:  ampicillin -sulbactam (UNASYN ) IV 3 g (01/04/25 0957)    Nutritional status Signs/Symptoms: moderate muscle depletion, percent weight loss (11% weight loss within 1 month) Percent weight loss: 11 % Interventions: Ensure Enlive (each supplement provides 350kcal and 20 grams of protein), Liberalize Diet Body mass index is 23.76 kg/m.  Data Reviewed:   CBC: Recent Labs  Lab 12/31/24 0133 01/01/25 0138 01/02/25 0235 01/03/25 0228 01/04/25 0206  WBC 30.3* 32.3* 22.3* 18.2* 15.2*  HGB 11.7* 10.8* 10.4* 11.7* 10.4*  HCT 37.2* 34.5* 32.9* 36.1* 33.5*  MCV 91.4 89.1 89.9 89.4 91.3  PLT 300 315 321 392 380   Basic Metabolic Panel: Recent Labs  Lab 12/29/24 0159 12/30/24 0156 12/31/24 0133 01/01/25 0138 01/02/25 0235 01/03/25 0228 01/04/25 0206  NA 143 145 143 141 141 140 138  K 4.8 4.9 5.0 4.6 4.4 4.4 4.4  CL 111 113* 110 109 107 106 106  CO2 22 22 23  24 24 23 22   GLUCOSE 98 95 96 115* 131* 104* 100*  BUN 73* 55* 50* 49* 47* 42* 38*  CREATININE 2.48* 2.21* 2.19* 2.45* 2.63* 2.72* 2.53*  CALCIUM 8.4* 8.4* 8.5* 8.3* 8.3* 9.4 8.5*  MG 1.9 1.8 1.9 1.9  --   --   --    GFR: Estimated Creatinine Clearance: 27.6 mL/min (A) (by C-G formula based on SCr of 2.53 mg/dL (H)). Liver Function Tests: Recent Labs  Lab 12/30/24 1141  AST 22  ALT 22  ALKPHOS 147*  BILITOT 0.3  PROT 5.9*  ALBUMIN  2.6*   No results for input(s): LIPASE, AMYLASE in the last 168 hours. Recent Labs  Lab 12/30/24 1004  AMMONIA 29   Coagulation Profile: No results for input(s): INR, PROTIME in the last 168 hours. Cardiac Enzymes: No results for input(s): CKTOTAL, CKMB, CKMBINDEX, TROPONINI in the last 168 hours. BNP (last 3 results) Recent Labs    12/25/24 0922  PROBNP 2,105.0*   HbA1C: No results for input(s): HGBA1C in the last 72 hours. CBG: Recent Labs   Lab 01/03/25 1138 01/03/25 1625 01/03/25 2012 01/04/25 0419 01/04/25 0811  GLUCAP 156* 119* 135* 103* 78   Lipid Profile: No results for input(s): CHOL, HDL, LDLCALC, TRIG, CHOLHDL, LDLDIRECT in the last 72 hours. Thyroid  Function Tests: No results for input(s): TSH, T4TOTAL, FREET4, T3FREE, THYROIDAB in the last 72 hours. Anemia Panel: No results for input(s): VITAMINB12, FOLATE, FERRITIN, TIBC, IRON, RETICCTPCT in the last 72 hours. Sepsis Labs: No results for input(s): PROCALCITON, LATICACIDVEN in the last 168 hours.  Recent Results (from the past 240 hours)  Urine Culture     Status: Abnormal   Collection Time: 12/25/24  3:27 PM   Specimen: Urine, Catheterized  Result Value Ref Range Status   Specimen Description   Final    URINE, CATHETERIZED Performed at [facility], [address], [place], [place] [address]    Special Requests   Final    NONE Performed at [facility], [address], [place], [place] [address]    Culture (A)  Final    60,000 COLONIES/mL ESCHERICHIA COLI >=100,000 COLONIES/mL ENTEROCOCCUS FAECALIS    Report Status 12/27/2024 FINAL  Final   Organism ID, Bacteria ESCHERICHIA COLI (A)  Final   Organism ID, Bacteria ENTEROCOCCUS FAECALIS (A)  Final      Susceptibility   Escherichia coli - MIC*    AMPICILLIN  8 SENSITIVE Sensitive     CEFAZOLIN  (URINE) Value in next row Sensitive      2 SENSITIVEThis is a modified FDA-approved test that has been validated and its performance characteristics determined by the reporting laboratory.  This laboratory is certified under the Clinical Laboratory Improvement Amendments CLIA as qualified to perform high complexity clinical laboratory testing.    CEFEPIME  Value in next row Sensitive      2 SENSITIVEThis is a modified FDA-approved test that has been validated and its performance characteristics determined by the reporting laboratory.  This laboratory is certified under the  Clinical Laboratory Improvement Amendments CLIA as qualified to perform high complexity clinical laboratory testing.    ERTAPENEM Value in next row Sensitive      2 SENSITIVEThis is a modified FDA-approved test that has been validated and its performance characteristics determined by the reporting laboratory.  This laboratory is certified under the Clinical Laboratory Improvement Amendments CLIA as qualified to perform high complexity clinical laboratory testing.    CEFTRIAXONE  Value in next row Sensitive      2  SENSITIVEThis is a modified FDA-approved test that has been validated and its performance characteristics determined by the reporting laboratory.  This laboratory is certified under the Clinical Laboratory Improvement Amendments CLIA as qualified to perform high complexity clinical laboratory testing.    CIPROFLOXACIN  Value in next row Sensitive      2 SENSITIVEThis is a modified FDA-approved test that has been validated and its performance characteristics determined by the reporting laboratory.  This laboratory is certified under the Clinical Laboratory Improvement Amendments CLIA as qualified to perform high complexity clinical laboratory testing.    GENTAMICIN Value in next row Sensitive      2 SENSITIVEThis is a modified FDA-approved test that has been validated and its performance characteristics determined by the reporting laboratory.  This laboratory is certified under the Clinical Laboratory Improvement Amendments CLIA as qualified to perform high complexity clinical laboratory testing.    NITROFURANTOIN Value in next row Sensitive      2 SENSITIVEThis is a modified FDA-approved test that has been validated and its performance characteristics determined by the reporting laboratory.  This laboratory is certified under the Clinical Laboratory Improvement Amendments CLIA as qualified to perform high complexity clinical laboratory testing.    TRIMETH/SULFA Value in next row Sensitive      2  SENSITIVEThis is a modified FDA-approved test that has been validated and its performance characteristics determined by the reporting laboratory.  This laboratory is certified under the Clinical Laboratory Improvement Amendments CLIA as qualified to perform high complexity clinical laboratory testing.    AMPICILLIN /SULBACTAM Value in next row Sensitive      2 SENSITIVEThis is a modified FDA-approved test that has been validated and its performance characteristics determined by the reporting laboratory.  This laboratory is certified under the Clinical Laboratory Improvement Amendments CLIA as qualified to perform high complexity clinical laboratory testing.    PIP/TAZO Value in next row Sensitive      <=4 SENSITIVEThis is a modified FDA-approved test that has been validated and its performance characteristics determined by the reporting laboratory.  This laboratory is certified under the Clinical Laboratory Improvement Amendments CLIA as qualified to perform high complexity clinical laboratory testing.    MEROPENEM Value in next row Sensitive      <=4 SENSITIVEThis is a modified FDA-approved test that has been validated and its performance characteristics determined by the reporting laboratory.  This laboratory is certified under the Clinical Laboratory Improvement Amendments CLIA as qualified to perform high complexity clinical laboratory testing.    * 60,000 COLONIES/mL ESCHERICHIA COLI   Enterococcus faecalis - MIC*    AMPICILLIN  Value in next row Sensitive      <=4 SENSITIVEThis is a modified FDA-approved test that has been validated and its performance characteristics determined by the reporting laboratory.  This laboratory is certified under the Clinical Laboratory Improvement Amendments CLIA as qualified to perform high complexity clinical laboratory testing.    NITROFURANTOIN Value in next row Sensitive      <=4 SENSITIVEThis is a modified FDA-approved test that has been validated and its  performance characteristics determined by the reporting laboratory.  This laboratory is certified under the Clinical Laboratory Improvement Amendments CLIA as qualified to perform high complexity clinical laboratory testing.    VANCOMYCIN  Value in next row Sensitive      <=4 SENSITIVEThis is a modified FDA-approved test that has been validated and its performance characteristics determined by the reporting laboratory.  This laboratory is certified under the Clinical Laboratory Improvement Amendments CLIA as qualified to  perform high complexity clinical laboratory testing.    * >=100,000 COLONIES/mL ENTEROCOCCUS FAECALIS  Urine Culture     Status: Abnormal   Collection Time: 12/25/24  9:01 PM   Specimen: Kidney, Right; Urine  Result Value Ref Range Status   Specimen Description URINE, RANDOM  Final   Special Requests RIGHT RENAL PELVIS SPEC A  Final   Culture (A)  Final    >=100,000 COLONIES/mL KLEBSIELLA PNEUMONIAE 30,000 COLONIES/mL ENTEROCOCCUS RAFFINOSUS Standardized susceptibility testing for this organism is not available. Performed at [facility], [address], [place], [place] [address]    Report Status 12/29/2024 FINAL  Final   Organism ID, Bacteria KLEBSIELLA PNEUMONIAE (A)  Final      Susceptibility   Klebsiella pneumoniae - MIC*    AMPICILLIN  RESISTANT Resistant     CEFAZOLIN  (URINE) Value in next row Sensitive      2 SENSITIVEThis is a modified FDA-approved test that has been validated and its performance characteristics determined by the reporting laboratory.  This laboratory is certified under the Clinical Laboratory Improvement Amendments CLIA as qualified to perform high complexity clinical laboratory testing.    CEFEPIME  Value in next row Sensitive      2 SENSITIVEThis is a modified FDA-approved test that has been validated and its performance characteristics determined by the reporting laboratory.  This laboratory is certified under the Clinical Laboratory  Improvement Amendments CLIA as qualified to perform high complexity clinical laboratory testing.    ERTAPENEM Value in next row Sensitive      2 SENSITIVEThis is a modified FDA-approved test that has been validated and its performance characteristics determined by the reporting laboratory.  This laboratory is certified under the Clinical Laboratory Improvement Amendments CLIA as qualified to perform high complexity clinical laboratory testing.    CEFTRIAXONE  Value in next row Sensitive      2 SENSITIVEThis is a modified FDA-approved test that has been validated and its performance characteristics determined by the reporting laboratory.  This laboratory is certified under the Clinical Laboratory Improvement Amendments CLIA as qualified to perform high complexity clinical laboratory testing.    CIPROFLOXACIN  Value in next row Sensitive      2 SENSITIVEThis is a modified FDA-approved test that has been validated and its performance characteristics determined by the reporting laboratory.  This laboratory is certified under the Clinical Laboratory Improvement Amendments CLIA as qualified to perform high complexity clinical laboratory testing.    GENTAMICIN Value in next row Sensitive      2 SENSITIVEThis is a modified FDA-approved test that has been validated and its performance characteristics determined by the reporting laboratory.  This laboratory is certified under the Clinical Laboratory Improvement Amendments CLIA as qualified to perform high complexity clinical laboratory testing.    NITROFURANTOIN Value in next row Intermediate      2 SENSITIVEThis is a modified FDA-approved test that has been validated and its performance characteristics determined by the reporting laboratory.  This laboratory is certified under the Clinical Laboratory Improvement Amendments CLIA as qualified to perform high complexity clinical laboratory testing.    TRIMETH/SULFA Value in next row Sensitive      2 SENSITIVEThis is a  modified FDA-approved test that has been validated and its performance characteristics determined by the reporting laboratory.  This laboratory is certified under the Clinical Laboratory Improvement Amendments CLIA as qualified to perform high complexity clinical laboratory testing.    AMPICILLIN /SULBACTAM Value in next row Sensitive      2 SENSITIVEThis is a  modified FDA-approved test that has been validated and its performance characteristics determined by the reporting laboratory.  This laboratory is certified under the Clinical Laboratory Improvement Amendments CLIA as qualified to perform high complexity clinical laboratory testing.    PIP/TAZO Value in next row Sensitive      <=4 SENSITIVEThis is a modified FDA-approved test that has been validated and its performance characteristics determined by the reporting laboratory.  This laboratory is certified under the Clinical Laboratory Improvement Amendments CLIA as qualified to perform high complexity clinical laboratory testing.    MEROPENEM Value in next row Sensitive      <=4 SENSITIVEThis is a modified FDA-approved test that has been validated and its performance characteristics determined by the reporting laboratory.  This laboratory is certified under the Clinical Laboratory Improvement Amendments CLIA as qualified to perform high complexity clinical laboratory testing.    * >=100,000 COLONIES/mL KLEBSIELLA PNEUMONIAE  Culture, blood (Routine X 2) w Reflex to ID Panel     Status: None (Preliminary result)   Collection Time: 01/01/25  8:05 AM   Specimen: BLOOD RIGHT HAND  Result Value Ref Range Status   Specimen Description BLOOD RIGHT HAND  Final   Special Requests   Final    BOTTLES DRAWN AEROBIC AND ANAEROBIC Blood Culture adequate volume   Culture   Final    NO GROWTH 3 DAYS Performed at [facility], [address], [place], [place] [address]    Report Status PENDING  Incomplete  Culture, blood (Routine X 2) w Reflex to ID  Panel     Status: None (Preliminary result)   Collection Time: 01/01/25  8:12 AM   Specimen: BLOOD LEFT HAND  Result Value Ref Range Status   Specimen Description BLOOD LEFT HAND  Final   Special Requests   Final    BOTTLES DRAWN AEROBIC AND ANAEROBIC Blood Culture adequate volume   Culture   Final    NO GROWTH 3 DAYS Performed at [facility], [address], [place], [place] [address]    Report Status PENDING  Incomplete         Radiology Studies: MR THORACIC SPINE W WO CONTRAST Result Date: 01/02/2025 EXAM: MR Thoracic Spine without 12/17/last year TECHNIQUE: Multiplanar multisequence MRI of the thoracic spine was performed without the administration of intravenous contrast. COMPARISON: CT chest, abdomen, and pelvis 12/29/last year, preoperative thoracic spine MRI 12/15/last year. CLINICAL HISTORY: 79 year old male, postoperative T7-T11 posterior fixation for T9 Chance fracture in ankylosed spine. Query infection. FINDINGS: Exam was discontinued prior to completion as the patient had difficulty tolerating. Sagittal precontrast and T2 weighted only axial images are provided. Normal thoracic spine segmentation on the comparison CT. BONES AND ALIGNMENT: Hardware susceptibility artifact at the T7 through T11 levels as expected. Underlying hyperostosis related thoracic spinal ankylosis. Unhealed Chance type fracture at T9. Associated fluid, which might be seroma, in the fracture cleft (series 9 images 11 through 14). Confluent superimposed vertebral body edema at T8 and T9. Stable vertebral height from the recent CT. Normal background bone marrow signal. No other marrow edema when allowing for hardware susceptibility. Incidental L2 benign vertebral hemangioma. SPINAL CORD: Normal spinal cord volume. No thoracic spinal cord signal abnormality identified. The conus medullaris appears normal at L1. SOFT TISSUES: Superimposed dependent bilateral pleural fluid and/or pulmonary atelectasis or  consolidation. No convincing paraspinal phlegmon or inflammation at that level. 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 T7-T8: Posterior spinal fusion hardware. Similar epidural lipomatosis and degenerative posterior element hypertrophy. T9-T10: Posterior decompression of this level. Laminectomy fluid collection measuring about 23 x 23 x 24 mm in certain dimensions in certain volumes is favored to be postoperative seroma, with only minimal mass effect on the posterior thecal sac (series 10 image 30). T9-T10 posterior spinal hardware. Epidural lipomatosis and degenerative posterior element hypertrophy similar to other levels. T10 through thoracolumbar junction: Interbody ankylosis by CT. The dorsal epidural lipomatosis and posterior element hypertrophy abates at T11-T12. IMPRESSION: 1. Truncated exam; difficult for patient to tolerate. Non-contrast images only and only T2 weighted axial images. 2. Posterior spinal fusion and Unhealed chance-type fracture at T9 in the otherwise ankylosed spine. No adverse features identified; favor small volume seroma in both the fracture cleft and the laminectomy space (6 mL). No convincing spinal infection based on these images. 3. Dependent bilateral pleural fluid, atelectasis and/or consolidation. Electronically signed by: Helayne Hurst MD 01/02/2025 11:23 AM EST RP Workstation: [id number]           LOS: 10 days   Time spent= 35 mins    Burgess JAYSON Dare, MD [facility]  If 7PM-7AM, please contact night-coverage  01/04/2025, 11:20 AM  "

## 2025-01-05 ENCOUNTER — Encounter (HOSPITAL_COMMUNITY): Payer: Self-pay | Admitting: Neurosurgery

## 2025-01-05 DIAGNOSIS — R6521 Severe sepsis with septic shock: Secondary | ICD-10-CM | POA: Diagnosis not present

## 2025-01-05 DIAGNOSIS — A419 Sepsis, unspecified organism: Secondary | ICD-10-CM | POA: Diagnosis not present

## 2025-01-05 LAB — BASIC METABOLIC PANEL WITH GFR
Anion gap: 10 (ref 5–15)
BUN: 31 mg/dL — ABNORMAL HIGH (ref 8–23)
CO2: 22 mmol/L (ref 22–32)
Calcium: 8.8 mg/dL — ABNORMAL LOW (ref 8.9–10.3)
Chloride: 109 mmol/L (ref 98–111)
Creatinine, Ser: 2.25 mg/dL — ABNORMAL HIGH (ref 0.61–1.24)
GFR, Estimated: 29 mL/min — ABNORMAL LOW
Glucose, Bld: 93 mg/dL (ref 70–99)
Potassium: 4.5 mmol/L (ref 3.5–5.1)
Sodium: 141 mmol/L (ref 135–145)

## 2025-01-05 LAB — CBC
HCT: 33.8 % — ABNORMAL LOW (ref 39.0–52.0)
Hemoglobin: 10.8 g/dL — ABNORMAL LOW (ref 13.0–17.0)
MCH: 29.2 pg (ref 26.0–34.0)
MCHC: 32 g/dL (ref 30.0–36.0)
MCV: 91.4 fL (ref 80.0–100.0)
Platelets: 381 K/uL (ref 150–400)
RBC: 3.7 MIL/uL — ABNORMAL LOW (ref 4.22–5.81)
RDW: 15.2 % (ref 11.5–15.5)
WBC: 15.6 K/uL — ABNORMAL HIGH (ref 4.0–10.5)
nRBC: 0 % (ref 0.0–0.2)

## 2025-01-05 LAB — GLUCOSE, CAPILLARY
Glucose-Capillary: 95 mg/dL (ref 70–99)
Glucose-Capillary: 90 mg/dL (ref 70–99)
Glucose-Capillary: 100 mg/dL — ABNORMAL HIGH (ref 70–99)
Glucose-Capillary: 87 mg/dL (ref 70–99)
Glucose-Capillary: 115 mg/dL — ABNORMAL HIGH (ref 70–99)

## 2025-01-05 LAB — AMMONIA: Ammonia: 15 umol/L (ref 9–35)

## 2025-01-05 MED ORDER — MAGIC MOUTHWASH W/LIDOCAINE: 10.0000 mL | Freq: Three times a day (TID) | ORAL | Status: DC | PRN

## 2025-01-05 NOTE — Plan of Care (Signed)

## 2025-01-05 NOTE — Progress Notes (Addendum)
 "  Palliative Medicine Inpatient Follow Up Note   HPI:  79 y.o. male  with past medical history significant of hypertension, SLE, CKD stage IIIb, bladder cancer status post multiple surgeries and TURBT (12/2022), low-grade papillary urethral carcinoma, urinary retention requiring intermittent catheterization 3-4 times a day, prediabetes, nephrolithiasis, anxiety, chronic, depression, and arthritis admitted on 12/25/2024 from rehab [facility]) with altered mental status, hypotension, tachycardia, and hypoxia.  At baseline, patient is ambulatory and conversant.  Wife reported worsening mentation for 3 days.     Recent hospitalization with T9 vertebral fracture after mechanical fall status post posterior decompression and fusion from T7-T11 on 12/13/2024 with Dr. Onetha.  This is a 7-day and 30-day readmission. Noted for 2 inpatient admissions in the last 6 months. He was recently admitted from 12/13 to 12/19/2024 due to T9 vertebral fracture.    PMT has been consulted to assist with goals of care conversation. Patient/Family face treatment option decisions, advanced directive decisions and anticipatory care needs.   This is hospital day #: 63  Today's Discussion 01/05/2025  I have reviewed medical records including:  EPIC notes: Reviewed hospitalist progress note from 01/05/2025 detailing treatment plan mainly for septic shock secondary to urosepsis, vertebral fracture status post decompression/fusion, ongoing metabolic encephalopathy.  Noted for plan to discharge patient to SNF most probably on Monday.  Reviewed physical therapy note from 01/05/2025, noted that patient is making some progress towards goal.  Reviewed to track clinical course and prognostication.  MAR: Reviewed PRN meds received over the last 24 hours.  Received as needed lorazepam  x 2 doses, as needed methocarbamol  x 2 doses, as needed oxycodone  x 4 doses, and trazodone  x 1 dose.  Reviewed to assess needs for medication adjustment to  optimize comfort.  Available advanced directives in ACP: DNR form and healthcare power of attorney document. Labs:   Reviewed laboratory results from 01/05/2025, ongoing AKI with creatinine at 2.25, and a GFR of 29.  Also with ongoing but improving leukocytosis with WBC at 15.6.  Reviewed to assist with prognostication.   Visited patient at bedside. Family member not present. Patient observed sleeping comfortably, breathing spontaneously and evenly. No signs or gestures indicating pain or distress. Patient left undisturbed to promote and optimize comfort and rest.  Coordinated plan of care with bedside RN. RN reports patient is stable but remains with intermittent confusion/disorientation. She reports recently administering Lorazepam  due to agitation. No significant event overnight.   Shortly thereafter, I contacted patient's wife telephonically. Reviewed with her current medical state. Per wife, medical team reports patient is clinically improving. Goals of care reviewed, plan is to continue with DNR/DNI. Continue with current medical interventions, allow time for outcomes, hoping for patient to be medically optimized.  Plan is for patient to be discharged to SNF for a trial of rehab as patient is able to tolerate, then goal is to ultimately discharge home with wife.   I discussed and provided education about my concern for patient's high risk for decompensation and the importance of clarification regarding overall plan of care as it relates to symptom management, rehospitalization and ensuring decisions are within the context of the patient's values and goals of care.    We discussed that, given the severity of your illness, the road to recovery may be difficult and unpredictable. Progress can be slow and there may be periods of improvement followed by setbacks or worsening symptoms. This cyclical pattern is common in advanced disease, and its important to be prepared for ups and  downs along the way.  Wife shared that she is open to transition patient to hospice if he continues to decline as she believes hospice pathway aligns with patient's GOCs.   Discussed that the goal of rehab is improvement/stabalization of functional status,  which can be a difficult goal to meet for patients with advanced illness and multiple medical conditions. Reviewed what is needed for someone to have a positive rehabilitation experience to include adequate nutritional intake as well as willingness/ability to participate.   Created space and opportunity for patient's wife [name] to explore thoughts feelings and fears regarding current medical situation.  Questions and concerns addressed   Palliative Support Provided.   Objective Assessment: Vital Signs Vitals:   01/05/25 0809 01/05/25 1657  BP: (!) 146/85 128/75  Pulse: 99 89  Resp: 15 15  Temp: 97.6 F (36.4 C) (!) 97.5 F (36.4 C)  SpO2: 94% 97%    Intake/Output Summary (Last 24 hours) at 01/05/2025 1716 Last data filed at 01/05/2025 0930 Gross per 24 hour  Intake 220 ml  Output 1000 ml  Net -780 ml   Last Weight  Most recent update: 01/05/2025  3:20 AM    Weight  81.2 kg (179 lb 0.2 oz)             Physical Exam Constitutional:      Appearance: He is ill-appearing.  HENT:     Head: Normocephalic and atraumatic.     Mouth/Throat:     Mouth: Mucous membranes are moist.  Cardiovascular:     Rate and Rhythm: Normal rate.  Pulmonary:     Effort: Pulmonary effort is normal.  Abdominal:     General: Abdomen is flat. Bowel sounds are normal.     Palpations: Abdomen is soft.  Musculoskeletal:     Comments: Generalized weakness   Skin:    General: Skin is warm.  Neurological:     Mental Status: He is alert. Mental status is at baseline.  Psychiatric:        Mood and Affect: Mood normal.     SUMMARY OF RECOMMENDATIONS    # Complex medical decision-making/goals of care  Code Status: Maintain DNR/DNI status Continue with current  medical interventions, allow time for outcomes, hoping patient to clinically improve. Cont. With delirium precautions due to ongoing confusion.  Plan is for patient to be discharged to skilled nursing facility for rehab trial.  Spoke with wife this afternoon, she stated that if patient declines while in rehab, that she is likely to transition patient to hospice philosophy of care as this aligns with patient's goals of care/wishes. Palliative medicine team will continue to follow.   # Symptom management Per Primary team Palliative medicine is available to assist as needed.    # Psychosocial support Continue to provide psycho-social and emotional support to patient and patient's wife, [name].  # Discharge planning  Skilled nursing facility for rehab  # Prognosis  Prognosis is poor given patient's high disease burden and high risk for decompensation.    # Treatment plan discussed with: Patient's wife [name], nursing staff, and medical team.  I personally spent a total of 35 minutes in the care of the patient today including preparing to see the patient, getting/reviewing separately obtained history, performing a medically appropriate exam/evaluation, placing orders, referring and communicating with other health care professionals, documenting clinical information in the EHR, and coordinating care.   Thank you for allowing us  to participate in the care of Zachary [name]  ______________________________________________________________________________________ Kathlyne Bolder NP-C [facility] Palliative [facility] Team Team Cell Phone: [phone] Please utilize secure chat with additional questions, if there is no response within 30 minutes please call the above phone number  Palliative Medicine Team providers are available by phone from 7am to 7pm daily and can be reached through the team cell phone.  Should this patient require assistance outside of these hours, please call the  patient's attending physician.     "

## 2025-01-05 NOTE — Progress Notes (Signed)
 Physical Therapy Treatment Patient Details Name: Zachary [name] MRN: [id number] DOB: 1946-12-18 Today's Date: 01/05/2025   History of Present Illness Pt is 79 yo presenting to [facility] on 12/25/24 due to AMS, hypotension, tachycardia, and hypoxia, sent from rehab facility. Pt recently hospitalized for mechanical fall with resultant T9 fx s/p PCDF T7-11 on 12/13/24. PMHx: HLD, HTN, CKD, bladder CA, DM, OSA.    PT Comments  Pt supine in bed asking to be fed on entry. With education pt is agreeable to getting up to recliner where he could try to feed himself. When pulling back bed sheets pt reports I've been a bad boy, I pooped Pt found to have had large BM. Pt requires maxAx2 for rolling to be cleaned and for placement of bed pad. Pt is able to initiate movement but is not able to complete rolling. Pt lifted to recliner where. Pt with only minor complaints of pain with rolling but no complaints of pain with sitting up in recliner. Pt set up for breakfast and was feeding himself at the end of the session. D/c plans remain appropriate at this time. PT will continue to follow acutely>    If plan is discharge home, recommend the following: Assistance with cooking/housework;Assist for transportation;Help with stairs or ramp for entrance;Two people to help with walking and/or transfers   Can travel by private vehicle      No  Equipment Recommendations  Wheelchair (measurements PT);Wheelchair cushion (measurements PT);BSC/3in1;Hoyer lift;Hospital bed       Precautions / Restrictions Precautions Precautions: Fall;Back Precaution Booklet Issued: No Recall of Precautions/Restrictions: Impaired Precaution/Restrictions Comments: TLSO not in hospital room Required Braces or Orthoses: Spinal Brace Spinal Brace: Thoracolumbosacral orthotic Restrictions Weight Bearing Restrictions Per Provider Order: No     Mobility  Bed Mobility Overal bed mobility: Needs Assistance Bed Mobility: Rolling Rolling: Max  assist, +2 for physical assistance         General bed mobility comments: maxAx2 for rolling first for pericare for large BM, and then for lift pad placement, pt able to iniate bending knee and reaching to rail but still needs maxA    Transfers Overall transfer level: Needs assistance                 General transfer comment: utilized [employer] to get to the recliner so he could feed himself, set him up for breakfast at end of session Transfer via Lift Equipment: [employer] Communication Communication: Impaired Factors Affecting Communication: Hearing impaired (mildly HoH)  Cognition Arousal: Alert Behavior During Therapy: Anxious, Flat affect   PT - Cognitive impairments: History of cognitive impairments                         Following commands: Impaired Following commands impaired: Follows one step commands inconsistently    Cueing Cueing Techniques: Verbal cues, Visual cues, Tactile cues  Exercises General Exercises - Lower Extremity Long Arc Quad: AROM, Both, 10 reps, Seated Hip Flexion/Marching: AAROM, Both, 5 reps, Seated    General Comments General comments (skin integrity, edema, etc.): VSS on RA      Pertinent Vitals/Pain Pain Assessment Pain Assessment: Faces Faces Pain Scale: Hurts little more Pain Location: back Pain Descriptors / Indicators: Discomfort, Grimacing Pain Intervention(s): Limited activity within patient's tolerance, Monitored during session, Repositioned    Home Living Family/patient expects to be discharged to:: Skilled nursing facility  Additional Comments: Pt admitted to STR facility following fall and spinal sx ~2 weeks ago and had been working with PT/OT there. Wife reports pt was working on standing and taking few steps to a wheelchair.        PT Goals (current goals can now be found in the care plan section) Acute Rehab PT Goals Patient Stated Goal: decrease  pain PT Goal Formulation: With patient/family Time For Goal Achievement: 01/10/25 Progress towards PT goals: Progressing toward goals    Frequency    Min 2X/week       AM-PAC PT 6 Clicks Mobility   Outcome Measure  Help needed turning from your back to your side while in a flat bed without using bedrails?: Total Help needed moving from lying on your back to sitting on the side of a flat bed without using bedrails?: Total Help needed moving to and from a bed to a chair (including a wheelchair)?: Total Help needed standing up from a chair using your arms (e.g., wheelchair or bedside chair)?: Total Help needed to walk in hospital room?: Total Help needed climbing 3-5 steps with a railing? : Total 6 Click Score: 6    End of Session   Activity Tolerance: Patient limited by pain Patient left: in bed;with call bell/phone within reach;with bed alarm set;with nursing/sitter in room (breakfast set up for him to self feed) Nurse Communication: Mobility status;Need for lift equipment PT Visit Diagnosis: Other abnormalities of gait and mobility (R26.89);Muscle weakness (generalized) (M62.81);Pain Pain - part of body:  (back)     Time: 9099-9064 PT Time Calculation (min) (ACUTE ONLY): 35 min  Charges:    $Therapeutic Exercise: 8-22 mins $Therapeutic Activity: 8-22 mins PT General Charges $$ ACUTE PT VISIT: 1 Visit                     Neeraj Housand B. Fleeta Lapidus PT, DPT Acute Rehabilitation Services Please use secure chat or  Call Office [phone]    Almarie KATHEE Fleeta [facility], [place] 01/05/2025, 9:53 AM

## 2025-01-05 NOTE — Progress Notes (Addendum)
 CSW resubmitted SNF auth with HTA. New shara is pending.   Pt can still transport with previous PTAR auth approval. [facility] Shara number [phone]   Per [facility] pt can remain on tele sitter while at hospital as it is not specifically for his behaviors. It will need to be discontinued at DC.

## 2025-01-05 NOTE — Progress Notes (Signed)
 " PROGRESS NOTE    Zachary [name]  FMW:[id number] DOB: 07/30/1946 DOA: 12/25/2024 PCP: Shona Norleen PEDLAR, MD    Brief Narrative:  72 yoM with PMH as below significant for HTN, HLD, CKD 3b, bladder cancer s/p multiple surgeries and TURBT (12/2022), low-grade papillary urethral carcinoma, urinary retention requiring intermittent catheterizations 3-4x/ day, pre-DM, OSA, chronic pain, nephrolithiasis, anxiety, and depression.  Recent hospitalization for T9 vertebral fracture due to mechanical fall requiring decompression and fusion of thoracic spine discharged to rehab on 12/23.  Returns back to the hospital for altered mental status hypotension, tachycardia and hypoxia.  Admitted to the ICU for septic shock secondary to UTI.  Underwent cystoscopy on 12/29 with right-sided ureteral stent placement.  Weaned off pressors on 12/30.  Initial antibiotics IV vancomycin  and Rocephin  was switched to p.o. Augmentin .  Seen by neurosurgery due to recent spinal surgery on 12/17.  Hardware appears to be intact.  MRI showing chronic changes along with postsurgical changes but no evidence of infection.  Hopefully plan for SNF on Monday  Assessment & Plan:  Septic shock secondary to urosepsis.  Pyonephrosis status post stent Leukocytosis - Urine cultures are growing E. coli, Klebsiella and Enterococcus faecalis.  Initially while in ICU antibiotics were narrowed to Augmentin  but due to worsening leukocytosis antibiotics were broadened to linezolid  and Zosyn .  Now transition to linezolid  and Unasyn . Eventual PO Regimen Linezolid /Augmentin .   CT abdomen pelvis and right upper quadrant ultrasound shows distended gallbladder, gallstones and some sludge but no evidence of acute cholecystitis.  LFTs are overall normal.  Monitor fever curve-seems to be eating well  Chronic Foley catheter, replaced during this admission  Vertebral fracture, thoracic spine status post decompression/fusion Incisional drainage? - Recently  performed on 12/17.  Routine pain control.  Seen by neurosurgery, Dr. Onetha.  Hardware appears to be intact. - MRI thoracic spine-shows lots of chronic changes and postsurgical changes but no obvious evidence of infection noted  Metabolic encephalopathy - Secondary to underlying infection.  Also has delirium.  Supportive measures  AKI on CKD 3B Hydronephrosis Urinary retention with chronic Foley catheter History of bladder cancer requiring previous TURBT -Continue Foley catheter.  Urology team is following.  Ureteroscopy planned in 2-3 weeks.  Per their service, no voiding trial necessary due to chronic catheter - Baseline creatinine 2.0, peaked at 3.9 during this admission now stable around 2.5  Essential hypertension - Slowly resume home BP medications as he is able to tolerate.  IV as needed  Hyperlipidemia - Pravastatin   Major depressive disorder Delirium - Cymbalta .  Seen by psychiatry team.  As needed Ativan  and bedtime Remeron  has been added. - B12, TSH, ammonia, folate are normal  Wound 12/25/24 1714 Pressure Injury Heel Left Deep Tissue Pressure Injury - Purple or maroon localized area of discolored intact skin or blood-filled blister due to damage of underlying soft tissue from pressure and/or shear. (Active)      PT/OT Palliative care team assisting with goals of care discussions.  DVT prophylaxis: heparin  injection 5,000 Units Start: 12/25/24 2200 SCDs Start: 12/25/24 1759      Code Status: Limited: Do not attempt resuscitation (DNR) -DNR-LIMITED -Do Not Intubate/DNI  Family Communication:   Status is: Inpatient Remains inpatient appropriate because: Continue antibiotics.  Hopefully SNF Monday   PT Follow up Recs: Skilled Nursing-Short Term Rehab (<3 Hours/Day)12/27/2024 1501  Subjective: Wanted me to go over what brought him into the hospital  Examination:   General: Appearance:  Elderly male in no acute distress  Lungs:      respirations unlabored   Heart:    Normal heart rate.    MS:   All extremities are intact.   Neurologic:   Awake, alert-oriented to place and time but situation is still fuzzy.  Does remember seeing Dr. Onetha              Wound 12/25/24 1714 Pressure Injury Heel Left Deep Tissue Pressure Injury - Purple or maroon localized area of discolored intact skin or blood-filled blister due to damage of underlying soft tissue from pressure and/or shear. (Active)     Diet Orders (From admission, onward)     Start     Ordered   01/01/25 1452  Diet regular Room service appropriate? Yes with Assist; Fluid consistency: Thin  Diet effective now       Question Answer Comment  Room service appropriate? Yes with Assist   Fluid consistency: Thin      01/01/25 1452            Objective: Vitals:   01/04/25 1923 01/04/25 2307 01/05/25 0312 01/05/25 0809  BP: 129/81 139/77 124/77 (!) 146/85  Pulse: 98 91 91 99  Resp: 17 17 (!) 21 15  Temp: 97.6 F (36.4 C) 97.7 F (36.5 C) (!) 97.5 F (36.4 C) 97.6 F (36.4 C)  TempSrc: Oral Oral Oral Oral  SpO2: 100% 94% 95% 94%  Weight:   81.2 kg   Height:        Intake/Output Summary (Last 24 hours) at 01/05/2025 1415 Last data filed at 01/05/2025 0930 Gross per 24 hour  Intake 220 ml  Output 1900 ml  Net -1680 ml   Filed Weights   01/02/25 0605 01/04/25 0415 01/05/25 0312  Weight: 82.3 kg 82.8 kg 81.2 kg    Scheduled Meds:  carvedilol   6.25 mg Oral BID   Chlorhexidine  Gluconate Cloth  6 each Topical Daily   dorzolamide -timolol   1 drop Both Eyes BID   DULoxetine   60 mg Oral BID   feeding supplement  237 mL Oral TID BM   heparin   5,000 Units Subcutaneous Q8H   latanoprost   1 drop Both Eyes QHS   lidocaine   1 patch Transdermal Q24H   linezolid   600 mg Oral Q12H   melatonin  3 mg Oral QHS   mirtazapine   7.5 mg Oral QHS   multivitamin with minerals  1 tablet Oral Daily   nystatin   5 mL Oral QID   polyethylene glycol  17 g Oral Daily   pravastatin   10 mg Oral  QHS   risperiDONE   0.5 mg Oral QHS   Continuous Infusions:  ampicillin -sulbactam (UNASYN ) IV 3 g (01/05/25 1110)    Nutritional status Signs/Symptoms: moderate muscle depletion, percent weight loss (11% weight loss within 1 month) Percent weight loss: 11 % Interventions: Ensure Enlive (each supplement provides 350kcal and 20 grams of protein), Liberalize Diet Body mass index is 23.3 kg/m.  Data Reviewed:   CBC: Recent Labs  Lab 01/01/25 0138 01/02/25 0235 01/03/25 0228 01/04/25 0206 01/05/25 0234  WBC 32.3* 22.3* 18.2* 15.2* 15.6*  HGB 10.8* 10.4* 11.7* 10.4* 10.8*  HCT 34.5* 32.9* 36.1* 33.5* 33.8*  MCV 89.1 89.9 89.4 91.3 91.4  PLT 315 321 392 380 381   Basic Metabolic Panel: Recent Labs  Lab 12/30/24 0156 12/31/24 0133 01/01/25 0138 01/02/25 0235 01/03/25 0228 01/04/25 0206 01/05/25 0839  NA 145 143 141 141 140 138 141  K 4.9 5.0 4.6 4.4 4.4 4.4 4.5  CL  113* 110 109 107 106 106 109  CO2 22 23 24 24 23 22 22   GLUCOSE 95 96 115* 131* 104* 100* 93  BUN 55* 50* 49* 47* 42* 38* 31*  CREATININE 2.21* 2.19* 2.45* 2.63* 2.72* 2.53* 2.25*  CALCIUM 8.4* 8.5* 8.3* 8.3* 9.4 8.5* 8.8*  MG 1.8 1.9 1.9  --   --   --   --    GFR: Estimated Creatinine Clearance: 31 mL/min (A) (by C-G formula based on SCr of 2.25 mg/dL (H)). Liver Function Tests: Recent Labs  Lab 12/30/24 1141  AST 22  ALT 22  ALKPHOS 147*  BILITOT 0.3  PROT 5.9*  ALBUMIN  2.6*   No results for input(s): LIPASE, AMYLASE in the last 168 hours. Recent Labs  Lab 12/30/24 1004 01/05/25 0234  AMMONIA 29 15   Coagulation Profile: No results for input(s): INR, PROTIME in the last 168 hours. Cardiac Enzymes: No results for input(s): CKTOTAL, CKMB, CKMBINDEX, TROPONINI in the last 168 hours. BNP (last 3 results) Recent Labs    12/25/24 0922  PROBNP 2,105.0*   HbA1C: No results for input(s): HGBA1C in the last 72 hours. CBG: Recent Labs  Lab 01/04/25 1949 01/04/25 2329  01/05/25 0333 01/05/25 0808 01/05/25 1111  GLUCAP 118* 96 95 90 100*   Lipid Profile: No results for input(s): CHOL, HDL, LDLCALC, TRIG, CHOLHDL, LDLDIRECT in the last 72 hours. Thyroid  Function Tests: No results for input(s): TSH, T4TOTAL, FREET4, T3FREE, THYROIDAB in the last 72 hours. Anemia Panel: No results for input(s): VITAMINB12, FOLATE, FERRITIN, TIBC, IRON, RETICCTPCT in the last 72 hours. Sepsis Labs: No results for input(s): PROCALCITON, LATICACIDVEN in the last 168 hours.  Recent Results (from the past 240 hours)  Culture, blood (Routine X 2) w Reflex to ID Panel     Status: None (Preliminary result)   Collection Time: 01/01/25  8:05 AM   Specimen: BLOOD RIGHT HAND  Result Value Ref Range Status   Specimen Description BLOOD RIGHT HAND  Final   Special Requests   Final    BOTTLES DRAWN AEROBIC AND ANAEROBIC Blood Culture adequate volume   Culture   Final    NO GROWTH 4 DAYS Performed at [facility], [address], [place], [place] [address]    Report Status PENDING  Incomplete  Culture, blood (Routine X 2) w Reflex to ID Panel     Status: None (Preliminary result)   Collection Time: 01/01/25  8:12 AM   Specimen: BLOOD LEFT HAND  Result Value Ref Range Status   Specimen Description BLOOD LEFT HAND  Final   Special Requests   Final    BOTTLES DRAWN AEROBIC AND ANAEROBIC Blood Culture adequate volume   Culture   Final    NO GROWTH 4 DAYS Performed at [facility], [address], [place], [place] [address]    Report Status PENDING  Incomplete         Radiology Studies: No results found.          LOS: 11 days   Time spent= 35 mins    Harlene RAYMOND Bowl, DO [facility]  If 7PM-7AM, please contact night-coverage  01/05/2025, 2:15 PM  "

## 2025-01-06 ENCOUNTER — Inpatient Hospital Stay (HOSPITAL_COMMUNITY)

## 2025-01-06 DIAGNOSIS — A419 Sepsis, unspecified organism: Secondary | ICD-10-CM | POA: Diagnosis not present

## 2025-01-06 DIAGNOSIS — R6521 Severe sepsis with septic shock: Secondary | ICD-10-CM | POA: Diagnosis not present

## 2025-01-06 LAB — CULTURE, BLOOD (ROUTINE X 2)
Culture: NO GROWTH
Culture: NO GROWTH
Special Requests: ADEQUATE
Special Requests: ADEQUATE

## 2025-01-06 LAB — COMPREHENSIVE METABOLIC PANEL WITH GFR
ALT: 27 U/L (ref 0–44)
AST: 24 U/L (ref 15–41)
Albumin: 2.8 g/dL — ABNORMAL LOW (ref 3.5–5.0)
Alkaline Phosphatase: 125 U/L (ref 38–126)
Anion gap: 10 (ref 5–15)
BUN: 31 mg/dL — ABNORMAL HIGH (ref 8–23)
CO2: 22 mmol/L (ref 22–32)
Calcium: 8.8 mg/dL — ABNORMAL LOW (ref 8.9–10.3)
Chloride: 107 mmol/L (ref 98–111)
Creatinine, Ser: 2.43 mg/dL — ABNORMAL HIGH (ref 0.61–1.24)
GFR, Estimated: 27 mL/min — ABNORMAL LOW
Glucose, Bld: 99 mg/dL (ref 70–99)
Potassium: 4.5 mmol/L (ref 3.5–5.1)
Sodium: 139 mmol/L (ref 135–145)
Total Bilirubin: 0.2 mg/dL (ref 0.0–1.2)
Total Protein: 6.3 g/dL — ABNORMAL LOW (ref 6.5–8.1)

## 2025-01-06 LAB — GLUCOSE, CAPILLARY
Glucose-Capillary: 98 mg/dL (ref 70–99)
Glucose-Capillary: 108 mg/dL — ABNORMAL HIGH (ref 70–99)
Glucose-Capillary: 101 mg/dL — ABNORMAL HIGH (ref 70–99)
Glucose-Capillary: 127 mg/dL — ABNORMAL HIGH (ref 70–99)

## 2025-01-06 LAB — CBC
HCT: 33.1 % — ABNORMAL LOW (ref 39.0–52.0)
Hemoglobin: 10.5 g/dL — ABNORMAL LOW (ref 13.0–17.0)
MCH: 28.6 pg (ref 26.0–34.0)
MCHC: 31.7 g/dL (ref 30.0–36.0)
MCV: 90.2 fL (ref 80.0–100.0)
Platelets: 412 K/uL — ABNORMAL HIGH (ref 150–400)
RBC: 3.67 MIL/uL — ABNORMAL LOW (ref 4.22–5.81)
RDW: 15.2 % (ref 11.5–15.5)
WBC: 13.2 K/uL — ABNORMAL HIGH (ref 4.0–10.5)
nRBC: 0 % (ref 0.0–0.2)

## 2025-01-06 MED ORDER — ACETAMINOPHEN 325 MG PO TABS
650.0000 mg | ORAL_TABLET | Freq: Three times a day (TID) | ORAL | Status: DC
  Administered 2025-01-06 – 2025-01-09 (×11): 650 mg via ORAL
  Filled 2025-01-06 (×12): qty 2

## 2025-01-06 MED ORDER — BARRIER CREAM NON-SPECIFIED: 1.0000 | TOPICAL_CREAM | TOPICAL | Status: DC | PRN

## 2025-01-06 NOTE — Plan of Care (Signed)

## 2025-01-06 NOTE — Progress Notes (Signed)
 " PROGRESS NOTE    Zachary [name]  FMW:[id number] DOB: November 22, 1946 DOA: 12/25/2024 PCP: Shona Norleen PEDLAR, MD    Brief Narrative:  65 yoM with PMH as below significant for HTN, HLD, CKD 3b, bladder cancer s/p multiple surgeries and TURBT (12/2022), low-grade papillary urethral carcinoma, urinary retention requiring intermittent catheterizations 3-4x/ day, pre-DM, OSA, chronic pain, nephrolithiasis, anxiety, and depression.  Recent hospitalization for T9 vertebral fracture due to mechanical fall requiring decompression and fusion of thoracic spine discharged to rehab on 12/23.  Returns back to the hospital for altered mental status hypotension, tachycardia and hypoxia.  Admitted to the ICU for septic shock secondary to UTI.  Underwent cystoscopy on 12/29 with right-sided ureteral stent placement.  Weaned off pressors on 12/30.  Initial antibiotics IV vancomycin  and Rocephin  was switched to p.o. Augmentin .  Seen by neurosurgery due to recent spinal surgery on 12/17.  Hardware appears to be intact.  MRI showing chronic changes along with postsurgical changes but no evidence of infection.  Hopefully plan for SNF on Monday  Assessment & Plan:  Septic shock secondary to urosepsis.  Pyonephrosis status post stent Leukocytosis - Urine cultures are growing E. coli, Klebsiella and Enterococcus faecalis.  Initially while in ICU antibiotics were narrowed to Augmentin  but due to worsening leukocytosis antibiotics were broadened to linezolid  and Zosyn .  Now transition to linezolid  and Unasyn . Eventual PO Regimen Linezolid /Augmentin .  CT abdomen pelvis and right upper quadrant ultrasound shows distended gallbladder, gallstones and some sludge but no evidence of acute cholecystitis.  LFTs are overall normal.  Monitor fever curve-seems to be eating well Chronic Foley catheter, replaced during this admission  Vertebral fracture, thoracic spine status post decompression/fusion Incisional drainage? - Recently performed on  12/17.  Routine pain control.  Seen by neurosurgery, Dr. Onetha.  Hardware appears to be intact. - MRI thoracic spine-shows lots of chronic changes and postsurgical changes but no obvious evidence of infection noted  Metabolic encephalopathy - Secondary to underlying infection.  Also has delirium.  Supportive measures  AKI on CKD 3B Hydronephrosis Urinary retention with chronic Foley catheter History of bladder cancer requiring previous TURBT -Continue Foley catheter.  Urology team is following.  Ureteroscopy planned in 2-3 weeks.  Per their service, no voiding trial necessary due to chronic catheter - Baseline creatinine 2.0, peaked at 3.9 during this admission now stable around 2.5 -will need outpatient follow up  Essential hypertension - Slowly resume home BP medications as he is able to tolerate.  IV as needed  Hyperlipidemia - Pravastatin   Major depressive disorder Delirium - Cymbalta .  Seen by psychiatry team.  As needed Ativan  and bedtime Remeron  has been added. - B12, TSH, ammonia, folate are normal Delirium precautions  Wound 12/25/24 1714 Pressure Injury Heel Left Deep Tissue Pressure Injury - Purple or maroon localized area of discolored intact skin or blood-filled blister due to damage of underlying soft tissue from pressure and/or shear. (Active)      PT/OT Palliative care team assisting with goals of care discussions.  DVT prophylaxis: heparin  injection 5,000 Units Start: 12/25/24 2200 SCDs Start: 12/25/24 1759      Code Status: Limited: Do not attempt resuscitation (DNR) -DNR-LIMITED -Do Not Intubate/DNI     Hopefully SNF Monday   PT Follow up Recs: Skilled Nursing-Short Term Rehab (<3 Hours/Day)12/27/2024 1501  Subjective: No overnight events  Examination:   General: Appearance:  Elderly/frail male in no acute distress     Lungs:      respirations unlabored  Heart:  Normal heart rate.    MS:   All extremities are intact.   Neurologic:   Awake,  alert- oriented         Objective: Vitals:   01/05/25 2005 01/05/25 2222 01/06/25 0433 01/06/25 0906  BP: 125/68 134/73 137/74 139/86  Pulse: 87 96 87 88  Resp: 15 18 18 18   Temp: 97.8 F (36.6 C) 97.6 F (36.4 C) 98.6 F (37 C) 97.7 F (36.5 C)  TempSrc: Oral Oral    SpO2: 95% 94% 99% 97%  Weight:      Height:        Intake/Output Summary (Last 24 hours) at 01/06/2025 1216 Last data filed at 01/06/2025 0600 Gross per 24 hour  Intake 300 ml  Output 500 ml  Net -200 ml   Filed Weights   01/02/25 0605 01/04/25 0415 01/05/25 0312  Weight: 82.3 kg 82.8 kg 81.2 kg    Scheduled Meds:  acetaminophen   650 mg Oral TID   carvedilol   6.25 mg Oral BID   Chlorhexidine  Gluconate Cloth  6 each Topical Daily   dorzolamide -timolol   1 drop Both Eyes BID   DULoxetine   60 mg Oral BID   feeding supplement  237 mL Oral TID BM   heparin   5,000 Units Subcutaneous Q8H   latanoprost   1 drop Both Eyes QHS   lidocaine   1 patch Transdermal Q24H   linezolid   600 mg Oral Q12H   melatonin  3 mg Oral QHS   mirtazapine   7.5 mg Oral QHS   multivitamin with minerals  1 tablet Oral Daily   nystatin   5 mL Oral QID   polyethylene glycol  17 g Oral Daily   pravastatin   10 mg Oral QHS   risperiDONE   0.5 mg Oral QHS   Continuous Infusions:  ampicillin -sulbactam (UNASYN ) IV 3 g (01/06/25 1039)    Nutritional status Signs/Symptoms: moderate muscle depletion, percent weight loss (11% weight loss within 1 month) Percent weight loss: 11 % Interventions: Ensure Enlive (each supplement provides 350kcal and 20 grams of protein), Liberalize Diet Body mass index is 23.3 kg/m.  Data Reviewed:   CBC: Recent Labs  Lab 01/02/25 0235 01/03/25 0228 01/04/25 0206 01/05/25 0234 01/06/25 0248  WBC 22.3* 18.2* 15.2* 15.6* 13.2*  HGB 10.4* 11.7* 10.4* 10.8* 10.5*  HCT 32.9* 36.1* 33.5* 33.8* 33.1*  MCV 89.9 89.4 91.3 91.4 90.2  PLT 321 392 380 381 412*   Basic Metabolic Panel: Recent Labs  Lab  12/31/24 0133 01/01/25 0138 01/02/25 0235 01/03/25 0228 01/04/25 0206 01/05/25 0839 01/06/25 0248  NA 143 141 141 140 138 141 139  K 5.0 4.6 4.4 4.4 4.4 4.5 4.5  CL 110 109 107 106 106 109 107  CO2 23 24 24 23 22 22 22   GLUCOSE 96 115* 131* 104* 100* 93 99  BUN 50* 49* 47* 42* 38* 31* 31*  CREATININE 2.19* 2.45* 2.63* 2.72* 2.53* 2.25* 2.43*  CALCIUM 8.5* 8.3* 8.3* 9.4 8.5* 8.8* 8.8*  MG 1.9 1.9  --   --   --   --   --    GFR: Estimated Creatinine Clearance: 28.7 mL/min (A) (by C-G formula based on SCr of 2.43 mg/dL (H)). Liver Function Tests: Recent Labs  Lab 01/06/25 0248  AST 24  ALT 27  ALKPHOS 125  BILITOT 0.2  PROT 6.3*  ALBUMIN  2.8*   No results for input(s): LIPASE, AMYLASE in the last 168 hours. Recent Labs  Lab 01/05/25 0234  AMMONIA 15   Coagulation  Profile: No results for input(s): INR, PROTIME in the last 168 hours. Cardiac Enzymes: No results for input(s): CKTOTAL, CKMB, CKMBINDEX, TROPONINI in the last 168 hours. BNP (last 3 results) Recent Labs    12/25/24 0922  PROBNP 2,105.0*   HbA1C: No results for input(s): HGBA1C in the last 72 hours. CBG: Recent Labs  Lab 01/05/25 1111 01/05/25 1656 01/05/25 2009 01/06/25 0755 01/06/25 1209  GLUCAP 100* 87 115* 98 108*   Lipid Profile: No results for input(s): CHOL, HDL, LDLCALC, TRIG, CHOLHDL, LDLDIRECT in the last 72 hours. Thyroid  Function Tests: No results for input(s): TSH, T4TOTAL, FREET4, T3FREE, THYROIDAB in the last 72 hours. Anemia Panel: No results for input(s): VITAMINB12, FOLATE, FERRITIN, TIBC, IRON, RETICCTPCT in the last 72 hours. Sepsis Labs: No results for input(s): PROCALCITON, LATICACIDVEN in the last 168 hours.  Recent Results (from the past 240 hours)  Culture, blood (Routine X 2) w Reflex to ID Panel     Status: None   Collection Time: 01/01/25  8:05 AM   Specimen: BLOOD RIGHT HAND  Result Value Ref Range Status    Specimen Description BLOOD RIGHT HAND  Final   Special Requests   Final    BOTTLES DRAWN AEROBIC AND ANAEROBIC Blood Culture adequate volume   Culture   Final    NO GROWTH 5 DAYS Performed at [facility], [address], [place], [place] [address]    Report Status 01/06/2025 FINAL  Final  Culture, blood (Routine X 2) w Reflex to ID Panel     Status: None   Collection Time: 01/01/25  8:12 AM   Specimen: BLOOD LEFT HAND  Result Value Ref Range Status   Specimen Description BLOOD LEFT HAND  Final   Special Requests   Final    BOTTLES DRAWN AEROBIC AND ANAEROBIC Blood Culture adequate volume   Culture   Final    NO GROWTH 5 DAYS Performed at [facility], [address] [place], [place] [address]    Report Status 01/06/2025 FINAL  Final         Radiology Studies: No results found.          LOS: 12 days   Time spent= 35 mins    Harlene RAYMOND Bowl, DO [facility]  If 7PM-7AM, please contact night-coverage  01/06/2025, 12:16 PM  "

## 2025-01-06 NOTE — Progress Notes (Signed)
 "  Palliative Medicine Inpatient Follow Up Note   HPI:  79 y.o. male  with past medical history significant of hypertension, SLE, CKD stage IIIb, bladder cancer status post multiple surgeries and TURBT (12/2022), low-grade papillary urethral carcinoma, urinary retention requiring intermittent catheterization 3-4 times a day, prediabetes, nephrolithiasis, anxiety, chronic, depression, and arthritis admitted on 12/25/2024 from rehab [name]) with altered mental status, hypotension, tachycardia, and hypoxia.  At baseline, patient is ambulatory and conversant.  Wife reported worsening mentation for 3 days.     Recent hospitalization with T9 vertebral fracture after mechanical fall status post posterior decompression and fusion from T7-T11 on 12/13/2024 with Dr. Onetha.   This is a 7-day and 30-day readmission. Noted for 2 inpatient admissions in the last 6 months. He was recently admitted from 12/13 to 12/19/2024 due to T9 vertebral fracture.    PMT has been consulted to assist with goals of care conversation. Patient/Family face treatment option decisions, advanced directive decisions and anticipatory care needs.   This is hospital day #: 12  Today's Discussion 01/06/2025  I have reviewed medical records including:  EPIC notes: Reviewed TOC progress note from 01/05/2025, patient may likely return to [facility] at discharge.  Reviewed physical therapy note from 01/05/2025, PT will continue to work with patient acutely.   MAR: Reviewed PRN meds received over the last 24 hours.  Patient received as needed Tylenol  x 1 dose, as needed Ativan  x 1 dose, as needed oxycodone  x 1 dose.  Reviewed to assess needs for medication adjustment to optimize comfort.  Available advanced directives in ACP: Available on file. Labs: Laboratory results reviewed from 01/06/2025: Creatinine still elevated at 2.43, albumin  low but improving at 2.8, still with leukocytosis but improving with WBC of 13.2.  Reviewed to assist with  prescribing and prognostication  Patient was observed resting comfortably in bed without evidence of acute distress.  No family member present at bedside.  No signs or behaviors suggestive of pain or discomfort were noted.  He did report intermittent  buttocks/sacral pain and soreness mostly at night. Rates it at 3-4/10, described it as achy.  He was alert and oriented x 3, able to express his needs, and engage appropriately in a meaningful conversation.  The patient reported waking up today feeling much improved.  He was observed to have consumed 100% of his breakfast.  He expressed optimism about his recovery and stated he is looking forward to continued improvement with the goal of ultimately returning home with his wife.  The patient inquired about the plan of care moving forward.  I explained that, if his condition continues to progress as expected, the medical team plans for discharge to a skilled nursing facility this coming Monday for short-term rehabilitation, with ultimate goal of discharge home with his wife.  He also ask about types of activities involved at SNF.  I explained that the focus would be on improving mobility, strength, and endurance to promote independence and ensure safety upon return home.  Goals of care remain unchanged at this time.  The plan is to continue current medical interventions to optimize the patient prior to discharge, with anticipated transfer to skilled nursing facility for short-term rehabilitation, and eventual discharge home with his wife.  Created space and opportunity for patient to explore thoughts feelings and fears regarding current medical situation.  Discussed the importance of continued conversation with family and their  medical providers regarding overall plan of care and treatment options, ensuring decisions are within the  context of the patients values and GOCs. The patient, family, and medical team were encouraged to revisit these discussions  regularly, as preferences and circumstances may change over time.  Coordinated and discussed plan of care with RN. Will switch Tylenol  from PRN to scheduled to address sacral pain and soreness, in addition to using barrier cream with each incontinent episode.   Questions and concerns addressed   Palliative Support Provided.   Objective Assessment: Vital Signs Vitals:   01/06/25 0433 01/06/25 0906  BP: 137/74 139/86  Pulse: 87 88  Resp: 18 18  Temp: 98.6 F (37 C) 97.7 F (36.5 C)  SpO2: 99% 97%    Intake/Output Summary (Last 24 hours) at 01/06/2025 1051 Last data filed at 01/06/2025 0600 Gross per 24 hour  Intake 300 ml  Output 500 ml  Net -200 ml   Last Weight  Most recent update: 01/05/2025  3:20 AM    Weight  81.2 kg (179 lb 0.2 oz)             Physical Exam Constitutional:      Appearance: He is ill-appearing.  HENT:     Head: Normocephalic and atraumatic.     Mouth/Throat:     Mouth: Mucous membranes are moist.  Cardiovascular:     Rate and Rhythm: Normal rate.  Pulmonary:     Effort: Pulmonary effort is normal.  Abdominal:     General: Abdomen is flat. Bowel sounds are normal.     Palpations: Abdomen is soft.  Genitourinary:    Comments: Foley catheter in place Musculoskeletal:     Cervical back: Normal range of motion and neck supple.     Comments: Generalized weakness  Skin:    General: Skin is warm.  Neurological:     Mental Status: He is alert. Mental status is at baseline.  Psychiatric:        Mood and Affect: Mood normal.     SUMMARY OF RECOMMENDATIONS    # Complex medical decision-making/goals of care  Code Status: Maintain DNR/DNI status Goal of care is medical stabilization and recovery to the extent this is possible.  Patient feels much better today compared to previous days. Plan is for patient to be discharged to skilled nursing facility for rehab, tentatively on Monday. Palliative medicine team will continue to follow.   #  Symptom management Per Primary team Will change Tylenol  from PRN to scheduled TID, ATC to help with ongoing mild buttocks/sacral pain and soreness Advised nursing to apply barrier cream to buttock with each incontinent episode.  May benefit from offloading buttocks/sacral area with frequent repositioning. Palliative medicine is available to assist as needed.    # Psychosocial support Continue to provide psycho-social and emotional support to patient and family.  # Discharge planning  Skilled nursing facility for rehab  # Prognosis  Prognosis is guarded given patient's high disease burden and high risk for decompensation.    # Treatment plan discussed with: Patient, patient's wife, nursing staff and medical team.   I personally spent a total of 35 minutes in the care of the patient today including preparing to see the patient, getting/reviewing separately obtained history, performing a medically appropriate exam/evaluation, counseling and educating, placing orders, referring and communicating with other health care professionals, documenting clinical information in the EHR, and coordinating care.   Thank you for allowing us  to participate in the care of Zachary [name]   ______________________________________________________________________________________ Kathlyne Bolder NP-C [facility] Palliative Medicine Team Team Cell Phone: [phone]  Please utilize secure chat with additional questions, if there is no response within 30 minutes please call the above phone number  Palliative Medicine Team providers are available by phone from 7am to 7pm daily and can be reached through the team cell phone.  Should this patient require assistance outside of these hours, please call the patient's attending physician.     "

## 2025-01-06 NOTE — Progress Notes (Signed)
" °   01/06/25 2306  BiPAP/CPAP/SIPAP  Reason BIPAP/CPAP not in use Other(comment) (Pt states it is too late to wear his CPAP and does not want to wear CPAP tonight.)  BiPAP/CPAP /SiPAP Vitals  Resp 14  MEWS Score/Color  MEWS Score 1  MEWS Score Color Green    "

## 2025-01-06 NOTE — TOC Progression Note (Signed)
 Transition of Care [facility]) - Progression Note    Patient Details  Name: Zachary [name] MRN: [id number] Date of Birth: Feb 03, 1946  Transition of [facility]) CM/SW Contact  Gwenn Frieze [place], [place] Phone Number: 01/06/2025, 4:15 PM  Clinical Narrative:  Beatris to Ernest with [employer] who reports their provider would like to offer a P2P for SNF. Provided MD with contact information for P2P, deadline is 3pm tomorrow.   Frieze Gwenn, MSW, LCSW [phone] (coverage)       Expected Discharge Plan: Skilled Nursing Facility Barriers to Discharge: Continued Medical Work up, English As A Second Language Teacher               Expected Discharge Plan and Services In-house Referral: Clinical Social Work     Living arrangements for the past 2 months: Single Family Home, Skilled Nursing Facility                                       Social Drivers of Health (SDOH) Interventions SDOH Screenings   Food Insecurity: No Food Insecurity (12/29/2024)  Housing: Low Risk (12/29/2024)  Transportation Needs: No Transportation Needs (12/29/2024)  Utilities: Not At Risk (12/29/2024)  Depression (PHQ2-9): High Risk (07/26/2023)  Social Connections: Moderately Isolated (12/29/2024)  Tobacco Use: Medium Risk (12/25/2024)    Readmission Risk Interventions    12/10/2024    9:44 AM 02/11/2023   12:02 PM  Readmission Risk Prevention Plan  Medication Screening Complete   Transportation Screening Complete Complete  PCP or Specialist Appt within 5-7 Days  Complete  Home Care Screening  Complete  Medication Review (RN CM)  Complete

## 2025-01-07 DIAGNOSIS — R6521 Severe sepsis with septic shock: Secondary | ICD-10-CM | POA: Diagnosis not present

## 2025-01-07 DIAGNOSIS — A419 Sepsis, unspecified organism: Secondary | ICD-10-CM | POA: Diagnosis not present

## 2025-01-07 LAB — GLUCOSE, CAPILLARY
Glucose-Capillary: 95 mg/dL (ref 70–99)
Glucose-Capillary: 85 mg/dL (ref 70–99)
Glucose-Capillary: 105 mg/dL — ABNORMAL HIGH (ref 70–99)
Glucose-Capillary: 101 mg/dL — ABNORMAL HIGH (ref 70–99)

## 2025-01-07 MED ORDER — MAGIC MOUTHWASH W/LIDOCAINE
5.0000 mL | Freq: Three times a day (TID) | ORAL | Status: DC
  Administered 2025-01-07 – 2025-01-09 (×2): 5 mL via ORAL
  Filled 2025-01-07 (×11): qty 5

## 2025-01-07 MED ORDER — CARVEDILOL 12.5 MG PO TABS
12.5000 mg | ORAL_TABLET | Freq: Two times a day (BID) | ORAL | Status: DC
  Administered 2025-01-07 – 2025-01-09 (×5): 12.5 mg via ORAL
  Filled 2025-01-07 (×6): qty 1

## 2025-01-07 MED ORDER — SODIUM CHLORIDE 0.9 % IV SOLN: INTRAVENOUS | Status: AC

## 2025-01-07 NOTE — Progress Notes (Signed)
 Called for peer to peer- no answer from NP.  JV

## 2025-01-07 NOTE — TOC Progression Note (Signed)
 Transition of Care [facility]) - Progression Note    Patient Details  Name: Zachary [name] MRN: [id number] Date of Birth: 1946/11/06  Transition of Care [facility]) CM/SW Contact  Gwenn Frieze [place], [place] Phone Number: 01/07/2025, 3:49 PM  Clinical Narrative: Per MD, P2P was completed and [facility] NP has denied SNF but requested PT see pt again and resubmit auth.   Frieze Gwenn, MSW, LCSW [phone] (coverage)      Expected Discharge Plan: Skilled Nursing Facility Barriers to Discharge: Continued Medical Work up, English As A Second Language Teacher               Expected Discharge Plan and Services In-house Referral: Clinical Social Work     Living arrangements for the past 2 months: Single Family Home, Skilled Nursing Facility                                       Social Drivers of Health (SDOH) Interventions SDOH Screenings   Food Insecurity: No Food Insecurity (12/29/2024)  Housing: Low Risk (12/29/2024)  Transportation Needs: No Transportation Needs (12/29/2024)  Utilities: Not At Risk (12/29/2024)  Depression (PHQ2-9): High Risk (07/26/2023)  Social Connections: Moderately Isolated (12/29/2024)  Tobacco Use: Medium Risk (12/25/2024)    Readmission Risk Interventions    12/10/2024    9:44 AM 02/11/2023   12:02 PM  Readmission Risk Prevention Plan  Medication Screening Complete   Transportation Screening Complete Complete  PCP or Specialist Appt within 5-7 Days  Complete  Home Care Screening  Complete  Medication Review (RN CM)  Complete

## 2025-01-07 NOTE — Progress Notes (Addendum)
 " PROGRESS NOTE    Zachary [name]  FMW:[id number] DOB: April 24, 1946 DOA: 12/25/2024 PCP: Shona Norleen PEDLAR, MD    Brief Narrative:  39 yoM with PMH as below significant for HTN, HLD, CKD 3b, bladder cancer s/p multiple surgeries and TURBT (12/2022), low-grade papillary urethral carcinoma, urinary retention requiring intermittent catheterizations 3-4x/ day, pre-DM, OSA, chronic pain, nephrolithiasis, anxiety, and depression.  Recent hospitalization for T9 vertebral fracture due to mechanical fall requiring decompression and fusion of thoracic spine discharged to rehab on 12/23.  Returns back to the hospital for altered mental status hypotension, tachycardia and hypoxia.  Admitted to the ICU for septic shock secondary to UTI.  Underwent cystoscopy on 12/29 with right-sided ureteral stent placement.  Weaned off pressors on 12/30.  Initial antibiotics IV vancomycin  and Rocephin  was switched to p.o. Augmentin .  Seen by neurosurgery due to recent spinal surgery on 12/17.  Hardware appears to be intact.  MRI showing chronic changes along with postsurgical changes but no evidence of infection.  Patient's insurance denied SNF.  Asked to resubmit with new PT eval as patient has become less confused over the weekend.   Assessment & Plan:  Septic shock secondary to urosepsis.  Pyonephrosis status post stent Leukocytosis - Urine cultures are growing E. coli, Klebsiella and Enterococcus faecalis.  Initially while in ICU antibiotics were narrowed to Augmentin  but due to worsening leukocytosis antibiotics were broadened to linezolid  and Zosyn .  Now transition to linezolid  and Unasyn . Eventual PO Regimen Linezolid /Augmentin .  CT abdomen pelvis and right upper quadrant ultrasound shows distended gallbladder, gallstones and some sludge but no evidence of acute cholecystitis.  LFTs are overall normal.  Monitor fever curve-seems to be eating well Chronic Foley catheter, replaced during this admission  Vertebral fracture,  thoracic spine status post decompression/fusion Incisional drainage? - Recently performed on 12/17.  Routine pain control.  Seen by neurosurgery, Dr. Onetha.  Hardware appears to be intact. - MRI thoracic spine-shows lots of chronic changes and postsurgical changes but no obvious evidence of infection noted  Metabolic encephalopathy - Secondary to underlying infection.  Also has delirium.  Supportive measures  AKI on CKD 3B Hydronephrosis Urinary retention with chronic Foley catheter History of bladder cancer requiring previous TURBT -Continue Foley catheter.  Urology team is following.  Ureteroscopy planned in 2-3 weeks.  Per their service, no voiding trial necessary due to chronic catheter - Baseline creatinine 2.0, peaked at 3.9 during this admission now stable around 2.5 -will need outpatient follow up  Essential hypertension - Slowly resume home BP medications as he is able to tolerate.  IV as needed  Hyperlipidemia - Pravastatin   Major depressive disorder Delirium - Cymbalta .  Seen by psychiatry team.  As needed Ativan  and bedtime Remeron  has been added. - B12, TSH, ammonia, folate are normal Delirium precautions  Wound 12/25/24 1714 Pressure Injury Heel Left Deep Tissue Pressure Injury - Purple or maroon localized area of discolored intact skin or blood-filled blister due to damage of underlying soft tissue from pressure and/or shear. (Active)   Called wife, no answer  PT/OT Palliative care team assisting with goals of care discussions.  DVT prophylaxis: heparin  injection 5,000 Units Start: 12/25/24 2200 SCDs Start: 12/25/24 1759      Code Status: Limited: Do not attempt resuscitation (DNR) -DNR-LIMITED -Do Not Intubate/DNI      PT Follow up Recs: Skilled Nursing-Short Term Rehab (<3 Hours/Day)12/27/2024 1501  Subjective: Patient states he would like to get out of bed and into a chair-I have sent to  his nursing staff a message  Examination:   General:  Appearance:  Weak appearing male in no acute distress     Lungs:      respirations unlabored  Heart:    Tachycardic.    MS:   All extremities are intact.   Neurologic:   Awake, alert- oriented to person and time         Objective: Vitals:   01/06/25 2306 01/07/25 0458 01/07/25 0500 01/07/25 0836  BP:  128/82  (!) 141/78  Pulse:  97  (!) 104  Resp: 14 18  18   Temp:  (!) 97.3 F (36.3 C)  98 F (36.7 C)  TempSrc:      SpO2:  97%  100%  Weight:   84.2 kg   Height:        Intake/Output Summary (Last 24 hours) at 01/07/2025 1259 Last data filed at 01/07/2025 0557 Gross per 24 hour  Intake 400 ml  Output 2350 ml  Net -1950 ml   Filed Weights   01/04/25 0415 01/05/25 0312 01/07/25 0500  Weight: 82.8 kg 81.2 kg 84.2 kg    Scheduled Meds:  acetaminophen   650 mg Oral TID   carvedilol   6.25 mg Oral BID   Chlorhexidine  Gluconate Cloth  6 each Topical Daily   dorzolamide -timolol   1 drop Both Eyes BID   DULoxetine   60 mg Oral BID   feeding supplement  237 mL Oral TID BM   heparin   5,000 Units Subcutaneous Q8H   latanoprost   1 drop Both Eyes QHS   lidocaine   1 patch Transdermal Q24H   linezolid   600 mg Oral Q12H   melatonin  3 mg Oral QHS   mirtazapine   7.5 mg Oral QHS   multivitamin with minerals  1 tablet Oral Daily   polyethylene glycol  17 g Oral Daily   pravastatin   10 mg Oral QHS   risperiDONE   0.5 mg Oral QHS   Continuous Infusions:  ampicillin -sulbactam (UNASYN ) IV 3 g (01/07/25 0840)    Nutritional status Signs/Symptoms: moderate muscle depletion, percent weight loss (11% weight loss within 1 month) Percent weight loss: 11 % Interventions: Ensure Enlive (each supplement provides 350kcal and 20 grams of protein), Liberalize Diet Body mass index is 24.16 kg/m.  Data Reviewed:   CBC: Recent Labs  Lab 01/02/25 0235 01/03/25 0228 01/04/25 0206 01/05/25 0234 01/06/25 0248  WBC 22.3* 18.2* 15.2* 15.6* 13.2*  HGB 10.4* 11.7* 10.4* 10.8* 10.5*  HCT 32.9*  36.1* 33.5* 33.8* 33.1*  MCV 89.9 89.4 91.3 91.4 90.2  PLT 321 392 380 381 412*   Basic Metabolic Panel: Recent Labs  Lab 01/01/25 0138 01/02/25 0235 01/03/25 0228 01/04/25 0206 01/05/25 0839 01/06/25 0248  NA 141 141 140 138 141 139  K 4.6 4.4 4.4 4.4 4.5 4.5  CL 109 107 106 106 109 107  CO2 24 24 23 22 22 22   GLUCOSE 115* 131* 104* 100* 93 99  BUN 49* 47* 42* 38* 31* 31*  CREATININE 2.45* 2.63* 2.72* 2.53* 2.25* 2.43*  CALCIUM 8.3* 8.3* 9.4 8.5* 8.8* 8.8*  MG 1.9  --   --   --   --   --    GFR: Estimated Creatinine Clearance: 28.7 mL/min (A) (by C-G formula based on SCr of 2.43 mg/dL (H)). Liver Function Tests: Recent Labs  Lab 01/06/25 0248  AST 24  ALT 27  ALKPHOS 125  BILITOT 0.2  PROT 6.3*  ALBUMIN  2.8*   No results for input(s): LIPASE, AMYLASE  in the last 168 hours. Recent Labs  Lab 01/05/25 0234  AMMONIA 15   Coagulation Profile: No results for input(s): INR, PROTIME in the last 168 hours. Cardiac Enzymes: No results for input(s): CKTOTAL, CKMB, CKMBINDEX, TROPONINI in the last 168 hours. BNP (last 3 results) Recent Labs    12/25/24 0922  PROBNP 2,105.0*   HbA1C: No results for input(s): HGBA1C in the last 72 hours. CBG: Recent Labs  Lab 01/06/25 1654 01/06/25 2112 01/07/25 0104 01/07/25 0736 01/07/25 1159  GLUCAP 101* 127* 95 85 105*   Lipid Profile: No results for input(s): CHOL, HDL, LDLCALC, TRIG, CHOLHDL, LDLDIRECT in the last 72 hours. Thyroid  Function Tests: No results for input(s): TSH, T4TOTAL, FREET4, T3FREE, THYROIDAB in the last 72 hours. Anemia Panel: No results for input(s): VITAMINB12, FOLATE, FERRITIN, TIBC, IRON, RETICCTPCT in the last 72 hours. Sepsis Labs: No results for input(s): PROCALCITON, LATICACIDVEN in the last 168 hours.  Recent Results (from the past 240 hours)  Culture, blood (Routine X 2) w Reflex to ID Panel     Status: None   Collection Time:  01/01/25  8:05 AM   Specimen: BLOOD RIGHT HAND  Result Value Ref Range Status   Specimen Description BLOOD RIGHT HAND  Final   Special Requests   Final    BOTTLES DRAWN AEROBIC AND ANAEROBIC Blood Culture adequate volume   Culture   Final    NO GROWTH 5 DAYS Performed at [facility], [address], [place], [place] [address]    Report Status 01/06/2025 FINAL  Final  Culture, blood (Routine X 2) w Reflex to ID Panel     Status: None   Collection Time: 01/01/25  8:12 AM   Specimen: BLOOD LEFT HAND  Result Value Ref Range Status   Specimen Description BLOOD LEFT HAND  Final   Special Requests   Final    BOTTLES DRAWN AEROBIC AND ANAEROBIC Blood Culture adequate volume   Culture   Final    NO GROWTH 5 DAYS Performed at [facility], [address], [place], [place] [address]    Report Status 01/06/2025 FINAL  Final         Radiology Studies: DG CHEST PORT 1 VIEW Result Date: 01/06/2025 EXAM: 1 VIEW(S) XRAY OF THE CHEST 01/06/2025 06:09:00 PM COMPARISON: 12/30/2024 CLINICAL HISTORY: Dyspnea FINDINGS: LUNGS AND PLEURA: Bibasilar atelectasis again noted. No pleural effusion. No pneumothorax. HEART AND MEDIASTINUM: No acute abnormality of the cardiac and mediastinal silhouettes. BONES AND SOFT TISSUES: Thoracic spinal fusion hardware present. Old left rib fractures. IMPRESSION: 1. No acute cardiopulmonary abnormality. Electronically signed by: Pinkie Pebbles MD MD 01/06/2025 11:39 PM EST RP Workstation: [id number]            LOS: 13 days   Time spent= 35 mins    Harlene RAYMOND Bowl, DO [facility]  If 7PM-7AM, please contact night-coverage  01/07/2025, 12:59 PM  "

## 2025-01-07 NOTE — Progress Notes (Signed)
 Called for peer to peer again.  Had to leave message as no answer. JV

## 2025-01-07 NOTE — Progress Notes (Signed)
" °   01/07/25 2100  BiPAP/CPAP/SIPAP  BiPAP/CPAP/SIPAP Pt Type Adult  BiPAP/CPAP/SIPAP Resmed  Mask Type Full face mask  FiO2 (%) 21 %  Patient Home Machine Yes  Safety Check Completed by RT for Home Unit Yes, no issues noted  Patient Home Mask Yes  Patient Home Tubing Yes  Auto Titrate Yes    "

## 2025-01-08 DIAGNOSIS — R6521 Severe sepsis with septic shock: Secondary | ICD-10-CM | POA: Diagnosis not present

## 2025-01-08 DIAGNOSIS — A419 Sepsis, unspecified organism: Secondary | ICD-10-CM | POA: Diagnosis not present

## 2025-01-08 LAB — BASIC METABOLIC PANEL WITH GFR
Anion gap: 10 (ref 5–15)
BUN: 32 mg/dL — ABNORMAL HIGH (ref 8–23)
CO2: 22 mmol/L (ref 22–32)
Calcium: 8.6 mg/dL — ABNORMAL LOW (ref 8.9–10.3)
Chloride: 108 mmol/L (ref 98–111)
Creatinine, Ser: 2.65 mg/dL — ABNORMAL HIGH (ref 0.61–1.24)
GFR, Estimated: 24 mL/min — ABNORMAL LOW
Glucose, Bld: 128 mg/dL — ABNORMAL HIGH (ref 70–99)
Potassium: 4.2 mmol/L (ref 3.5–5.1)
Sodium: 140 mmol/L (ref 135–145)

## 2025-01-08 LAB — CBC
HCT: 34.2 % — ABNORMAL LOW (ref 39.0–52.0)
Hemoglobin: 10.7 g/dL — ABNORMAL LOW (ref 13.0–17.0)
MCH: 28.6 pg (ref 26.0–34.0)
MCHC: 31.3 g/dL (ref 30.0–36.0)
MCV: 91.4 fL (ref 80.0–100.0)
Platelets: 293 K/uL (ref 150–400)
RBC: 3.74 MIL/uL — ABNORMAL LOW (ref 4.22–5.81)
RDW: 15.9 % — ABNORMAL HIGH (ref 11.5–15.5)
WBC: 12.4 K/uL — ABNORMAL HIGH (ref 4.0–10.5)
nRBC: 0 % (ref 0.0–0.2)

## 2025-01-08 NOTE — Plan of Care (Signed)

## 2025-01-08 NOTE — Progress Notes (Signed)
 Speech Language Pathology Treatment: Dysphagia  Patient Details Name: Zachary [name] MRN: [id number] DOB: 1946/05/01 Today's Date: 01/08/2025 Time: 8857-8840 SLP Time Calculation (min) (ACUTE ONLY): 17 min  Assessment / Plan / Recommendation Clinical Impression  SLP re-consulted for clinical swallow re-evaluation due to reported new swallowing difficulty per order. Per discussion with RN and pt, no acute swallowing concerns with PO intake have been noted at this time. Pt agreeable to re-assessment.   Pt presents with a grossly functional oropharyngeal swallow per clinical swallow re-assessment today. Mastication efforts observed to be mildly prolonged, though pt is able to clear bolus from oral cavity with additional time. No overt or subtle s/s of aspiration observed across PO trials.   Per chart review, pt is afebrile with clear CXR on 01/06/25. He is on room air and WBC, though elevated, is trending down.   Recommend continue regular diet and thin liquids as tolerated. No acute SLP needs identified at this time. SLP will sig off.    HPI HPI: Raliegh Scobie is a 79 yo M who was admitted from [facility] with AMS, hypotension, tachycardia, and hypoxia. Found to have septic shock 2/2 ureteral stone now s/p cystoscopy and stent placement. Chest CT 12/29 with Small bilateral pleural effusions. Bibasilar atelectasis. No focal consolidation or pulmonary edema. No pneumothorax. Head CT 12/29 with no acute findings. PMH significant for HTN, HLD, CKD 3b, bladder cancer s/p multiple surgeries and TURBT (12/2022), low-grade papillary urethral carcinoma, urinary retention requiring intermittent catheterizations 3-4x/ day, pre-DM, OSA, chronic pain, nephrolithiasis, anxiety, and depression. Recent hospitalization with T9 vertebral fracture after mechanical fall s/p posterior decompression and fusion from T7-T11 on 12/17 with Dr. Onetha discharged to [facility] rehab 12/23.  BSE and SLP follow up recently  completed by SLP during this admission. SLP signed off on 01/01/25 given pt tolerating a regular diet and thin liquids. SLP re-consulted for clinical swallow re-evalaution on 01/08/25. No new swallowing concerns were observed.      SLP Plan  All goals met;Discharge SLP treatment due to (comment)        Recommendations  Diet recommendations: Regular;Thin liquid Liquids provided via: Cup;Straw Medication Administration:  (as tolerated) Supervision: Patient able to self feed Compensations: Slow rate;Small sips/bites Postural Changes and/or Swallow Maneuvers: Seated upright 90 degrees;Upright 30-60 min after meal                  Oral care BID;Staff/trained caregiver to provide oral care   Frequent or constant Supervision/Assistance  (grossly functional oropharyngeal swallow)     All goals met;Discharge SLP treatment due to (comment)     Jakaya Jacobowitz J Cerena Baine  01/08/2025, 1:14 PM

## 2025-01-08 NOTE — Progress Notes (Signed)
 " PROGRESS NOTE    Zachary [name]  FMW:[id number] DOB: July 13, 1946 DOA: 12/25/2024 PCP: Shona Norleen PEDLAR, MD    Brief Narrative:  70 yoM with PMH as below significant for HTN, HLD, CKD 3b, bladder cancer s/p multiple surgeries and TURBT (12/2022), low-grade papillary urethral carcinoma, urinary retention requiring intermittent catheterizations 3-4x/ day, pre-DM, OSA, chronic pain, nephrolithiasis, anxiety, and depression.  Recent hospitalization for T9 vertebral fracture due to mechanical fall requiring decompression and fusion of thoracic spine discharged to rehab on 12/23.  Returns back to the hospital for altered mental status hypotension, tachycardia and hypoxia.  Admitted to the ICU for septic shock secondary to UTI.  Underwent cystoscopy on 12/29 with right-sided ureteral stent placement.  Weaned off pressors on 12/30.  Initial antibiotics IV vancomycin  and Rocephin  was switched to p.o. Augmentin .  Seen by neurosurgery due to recent spinal surgery on 12/17.  Hardware appears to be intact.  MRI showing chronic changes along with postsurgical changes but no evidence of infection.  Patient's insurance denied SNF.     Assessment & Plan:  Septic shock secondary to urosepsis.  Pyonephrosis status post stent Leukocytosis - Urine cultures are growing E. coli, Klebsiella and Enterococcus faecalis.  Initially while in ICU antibiotics were narrowed to Augmentin  but due to worsening leukocytosis antibiotics were broadened to linezolid  and Zosyn .  Now transition to linezolid  and Unasyn . Eventual PO Regimen Linezolid /Augmentin .  CT abdomen pelvis and right upper quadrant ultrasound shows distended gallbladder, gallstones and some sludge but no evidence of acute cholecystitis.  LFTs are overall normal.  Monitor fever curve-seems to be eating well Chronic Foley catheter, replaced during this admission  Vertebral fracture, thoracic spine status post decompression/fusion Incisional drainage? - Recently performed  on 12/17.  Routine pain control.  Seen by neurosurgery, Dr. Onetha.  Hardware appears to be intact. - MRI thoracic spine-shows lots of chronic changes and postsurgical changes but no obvious evidence of infection noted  Metabolic encephalopathy - Secondary to underlying infection.  Also has delirium.  Supportive measures -Delirium precaution  AKI on CKD 3B Hydronephrosis Urinary retention with chronic Foley catheter History of bladder cancer requiring previous TURBT -Continue Foley catheter.  Urology team is following.  Ureteroscopy planned in 2-3 weeks.  Per their service, no voiding trial necessary due to chronic catheter - Baseline creatinine 2.0, peaked at 3.9 during this admission now stable around 2.5 -will need outpatient follow up  Essential hypertension - Slowly resume home BP medications as he is able to tolerate.  IV as needed  Hyperlipidemia - Pravastatin   Major depressive disorder Delirium - Cymbalta .  Seen by psychiatry team.  As needed Ativan  and bedtime Remeron  has been added. - B12, TSH, ammonia, folate are normal Delirium precautions  Wound 12/25/24 1714 Pressure Injury Heel Left Deep Tissue Pressure Injury - Purple or maroon localized area of discolored intact skin or blood-filled blister due to damage of underlying soft tissue from pressure and/or shear. (Active)   Discussed with wife that if insurance does not approve skilled nursing facility she will need to come up with another plan-pay for long-term care, home with hospice?  PT/OT Palliative care team assisting with goals of care discussions.  DVT prophylaxis: heparin  injection 5,000 Units Start: 12/25/24 2200 SCDs Start: 12/25/24 1759      Code Status: Limited: Do not attempt resuscitation (DNR) -DNR-LIMITED -Do Not Intubate/DNI      PT Follow up Recs: Skilled Nursing-Short Term Rehab (<3 Hours/Day)12/27/2024 1501  Subjective: Confused this morning Per nursing sat up  for 2 hours in a chair  yesterday  Examination:   General: Appearance:  Weak appearing male in no acute distress     Lungs:      respirations unlabored  Heart:    Tachycardic.    MS:   All extremities are intact.   Neurologic: Irritable and confused         Objective: Vitals:   01/07/25 1618 01/07/25 1953 01/08/25 0412 01/08/25 0905  BP: 121/73 127/84 123/81 111/76  Pulse: 84 89 89 (!) 107  Resp: 18 18 18 18   Temp: (!) 97.5 F (36.4 C) 97.7 F (36.5 C) 97.6 F (36.4 C) 97.9 F (36.6 C)  TempSrc: Oral     SpO2: 98% 97% 98% 96%  Weight:      Height:        Intake/Output Summary (Last 24 hours) at 01/08/2025 1310 Last data filed at 01/08/2025 0531 Gross per 24 hour  Intake 278.89 ml  Output 100 ml  Net 178.89 ml   Filed Weights   01/04/25 0415 01/05/25 0312 01/07/25 0500  Weight: 82.8 kg 81.2 kg 84.2 kg    Scheduled Meds:  acetaminophen   650 mg Oral TID   carvedilol   12.5 mg Oral BID   Chlorhexidine  Gluconate Cloth  6 each Topical Daily   dorzolamide -timolol   1 drop Both Eyes BID   DULoxetine   60 mg Oral BID   feeding supplement  237 mL Oral TID BM   heparin   5,000 Units Subcutaneous Q8H   latanoprost   1 drop Both Eyes QHS   lidocaine   1 patch Transdermal Q24H   linezolid   600 mg Oral Q12H   magic mouthwash w/lidocaine   5 mL Oral TID   melatonin  3 mg Oral QHS   mirtazapine   7.5 mg Oral QHS   multivitamin with minerals  1 tablet Oral Daily   polyethylene glycol  17 g Oral Daily   pravastatin   10 mg Oral QHS   risperiDONE   0.5 mg Oral QHS   Continuous Infusions:  sodium chloride  50 mL/hr at 01/07/25 1355   ampicillin -sulbactam (UNASYN ) IV 3 g (01/08/25 0917)    Nutritional status Signs/Symptoms: moderate muscle depletion, percent weight loss (11% weight loss within 1 month) Percent weight loss: 11 % Interventions: Ensure Enlive (each supplement provides 350kcal and 20 grams of protein), Liberalize Diet Body mass index is 24.16 kg/m.  Data Reviewed:   CBC: Recent Labs   Lab 01/03/25 0228 01/04/25 0206 01/05/25 0234 01/06/25 0248 01/08/25 0953  WBC 18.2* 15.2* 15.6* 13.2* 12.4*  HGB 11.7* 10.4* 10.8* 10.5* 10.7*  HCT 36.1* 33.5* 33.8* 33.1* 34.2*  MCV 89.4 91.3 91.4 90.2 91.4  PLT 392 380 381 412* 293   Basic Metabolic Panel: Recent Labs  Lab 01/03/25 0228 01/04/25 0206 01/05/25 0839 01/06/25 0248 01/08/25 0953  NA 140 138 141 139 140  K 4.4 4.4 4.5 4.5 4.2  CL 106 106 109 107 108  CO2 23 22 22 22 22   GLUCOSE 104* 100* 93 99 128*  BUN 42* 38* 31* 31* 32*  CREATININE 2.72* 2.53* 2.25* 2.43* 2.65*  CALCIUM 9.4 8.5* 8.8* 8.8* 8.6*   GFR: Estimated Creatinine Clearance: 26.4 mL/min (A) (by C-G formula based on SCr of 2.65 mg/dL (H)). Liver Function Tests: Recent Labs  Lab 01/06/25 0248  AST 24  ALT 27  ALKPHOS 125  BILITOT 0.2  PROT 6.3*  ALBUMIN  2.8*   No results for input(s): LIPASE, AMYLASE in the last 168 hours. Recent Labs  Lab  01/05/25 0234  AMMONIA 15   Coagulation Profile: No results for input(s): INR, PROTIME in the last 168 hours. Cardiac Enzymes: No results for input(s): CKTOTAL, CKMB, CKMBINDEX, TROPONINI in the last 168 hours. BNP (last 3 results) Recent Labs    12/25/24 0922  PROBNP 2,105.0*   HbA1C: No results for input(s): HGBA1C in the last 72 hours. CBG: Recent Labs  Lab 01/06/25 2112 01/07/25 0104 01/07/25 0736 01/07/25 1159 01/07/25 1702  GLUCAP 127* 95 85 105* 101*   Lipid Profile: No results for input(s): CHOL, HDL, LDLCALC, TRIG, CHOLHDL, LDLDIRECT in the last 72 hours. Thyroid  Function Tests: No results for input(s): TSH, T4TOTAL, FREET4, T3FREE, THYROIDAB in the last 72 hours. Anemia Panel: No results for input(s): VITAMINB12, FOLATE, FERRITIN, TIBC, IRON, RETICCTPCT in the last 72 hours. Sepsis Labs: No results for input(s): PROCALCITON, LATICACIDVEN in the last 168 hours.  Recent Results (from the past 240 hours)  Culture,  blood (Routine X 2) w Reflex to ID Panel     Status: None   Collection Time: 01/01/25  8:05 AM   Specimen: BLOOD RIGHT HAND  Result Value Ref Range Status   Specimen Description BLOOD RIGHT HAND  Final   Special Requests   Final    BOTTLES DRAWN AEROBIC AND ANAEROBIC Blood Culture adequate volume   Culture   Final    NO GROWTH 5 DAYS Performed at [facility], [address], [place], [place] [address]    Report Status 01/06/2025 FINAL  Final  Culture, blood (Routine X 2) w Reflex to ID Panel     Status: None   Collection Time: 01/01/25  8:12 AM   Specimen: BLOOD LEFT HAND  Result Value Ref Range Status   Specimen Description BLOOD LEFT HAND  Final   Special Requests   Final    BOTTLES DRAWN AEROBIC AND ANAEROBIC Blood Culture adequate volume   Culture   Final    NO GROWTH 5 DAYS Performed at [facility], [address], [place], [place] [address]    Report Status 01/06/2025 FINAL  Final         Radiology Studies: DG CHEST PORT 1 VIEW Result Date: 01/06/2025 EXAM: 1 VIEW(S) XRAY OF THE CHEST 01/06/2025 06:09:00 PM COMPARISON: 12/30/2024 CLINICAL HISTORY: Dyspnea FINDINGS: LUNGS AND PLEURA: Bibasilar atelectasis again noted. No pleural effusion. No pneumothorax. HEART AND MEDIASTINUM: No acute abnormality of the cardiac and mediastinal silhouettes. BONES AND SOFT TISSUES: Thoracic spinal fusion hardware present. Old left rib fractures. IMPRESSION: 1. No acute cardiopulmonary abnormality. Electronically signed by: Pinkie Pebbles MD MD 01/06/2025 11:39 PM EST RP Workstation: [id number]            LOS: 14 days   Time spent= 35 mins    Harlene RAYMOND Bowl, DO [facility]  If 7PM-7AM, please contact night-coverage  01/08/2025, 1:10 PM  "

## 2025-01-08 NOTE — Progress Notes (Signed)
 Physical Therapy Treatment Patient Details Name: Zachary [name] MRN: [id number] DOB: 12-24-1946 Today's Date: 01/08/2025   History of Present Illness Pt is 79 yo presenting to [facility] on 12/25/24 due to AMS, hypotension, tachycardia, and hypoxia, sent from rehab facility. Pt recently hospitalized for mechanical fall with resultant T9 fx s/p PCDF T7-11 on 12/13/24. PMHx: HLD, HTN, CKD, bladder CA, DM, OSA.    PT Comments  Pt is progressing towards goals. Currently pt is Mod to Max A for bed mobility, Max A for sit to stand/stand pivot to recliner. Pt requires encouragement and consolation due to high fear of falling. Spouse/sister present during session. Due to pt current functional status, home set up and available assistance at home recommending skilled physical therapy services < 3 hours/day in order to address strength, balance and functional mobility to decrease risk for falls, injury, immobility, skin break down and re-hospitalization.      If plan is discharge home, recommend the following: Assistance with cooking/housework;Assist for transportation;Help with stairs or ramp for entrance;Two people to help with walking and/or transfers   Can travel by private vehicle     No  Equipment Recommendations  Wheelchair (measurements PT);Wheelchair cushion (measurements PT);BSC/3in1;Hoyer lift;Hospital bed       Precautions / Restrictions Precautions Precautions: Fall;Back Precaution Booklet Issued: No Recall of Precautions/Restrictions: Impaired Precaution/Restrictions Comments: TLSO not in hospital room Required Braces or Orthoses: Spinal Brace Spinal Brace: Thoracolumbosacral orthotic     Mobility  Bed Mobility Overal bed mobility: Needs Assistance Bed Mobility: Rolling, Sidelying to Sit Rolling: Mod assist       Sit to sidelying: Max assist General bed mobility comments: Mod A for rolling to the R and Max A for getting trunk to mid line. Pt requries cues in order to initiate  movement.    Transfers Overall transfer level: Needs assistance Equipment used: None Transfers: Bed to chair/wheelchair/BSC   Stand pivot transfers: Max assist         General transfer comment: Max A for stand pivot transfer with multi modal cues for safe sequencing.    Ambulation/Gait   General Gait Details: unable to progress gait at this time.     Balance Overall balance assessment: Needs assistance Sitting-balance support: Bilateral upper extremity supported, Feet supported Sitting balance-Leahy Scale: Poor Sitting balance - Comments: Min A initially then CGA to SBA for ~ 10 min sitting EOB without back support. Postural control: Posterior lean Standing balance support: Bilateral upper extremity supported Standing balance-Leahy Scale: Poor Standing balance comment: reliant on external support      Communication Communication Communication: Impaired Factors Affecting Communication: Hearing impaired  Cognition Arousal: Alert Behavior During Therapy: Anxious, Flat affect   PT - Cognitive impairments: History of cognitive impairments     PT - Cognition Comments: wife reports h/o dementia, including some memory deficits.   Following commands impaired: Follows one step commands with increased time, Follows multi-step commands inconsistently    Cueing Cueing Techniques: Verbal cues, Visual cues, Tactile cues     General Comments General comments (skin integrity, edema, etc.): Vital signs stable on room air. Sister and spouse present and supportive during session. Pt was able to remember the password for his cellphone.      Pertinent Vitals/Pain Pain Assessment Pain Assessment: 0-10 Pain Score: 10-Worst pain ever Pain Location: back Pain Descriptors / Indicators: Discomfort, Grimacing Pain Intervention(s): Monitored during session, Premedicated before session, Limited activity within patient's tolerance     PT Goals (current goals can now be found in  the care  plan section) Acute Rehab PT Goals Patient Stated Goal: decrease pain PT Goal Formulation: With patient/family Time For Goal Achievement: 01/10/25 Potential to Achieve Goals: Fair Progress towards PT goals: Progressing toward goals    Frequency    Min 2X/week      PT Plan  Continue with current POC     AM-PAC PT 6 Clicks Mobility   Outcome Measure  Help needed turning from your back to your side while in a flat bed without using bedrails?: A Lot Help needed moving from lying on your back to sitting on the side of a flat bed without using bedrails?: A Lot Help needed moving to and from a bed to a chair (including a wheelchair)?: A Lot Help needed standing up from a chair using your arms (e.g., wheelchair or bedside chair)?: A Lot Help needed to walk in hospital room?: Total Help needed climbing 3-5 steps with a railing? : Total 6 Click Score: 10    End of Session Equipment Utilized During Treatment: Gait belt Activity Tolerance: Patient limited by pain Patient left: with call bell/phone within reach;in chair;with chair alarm set;with family/visitor present Nurse Communication: Mobility status;Need for lift equipment PT Visit Diagnosis: Other abnormalities of gait and mobility (R26.89);Muscle weakness (generalized) (M62.81);Pain Pain - Right/Left:  (back) Pain - part of body:  (back)     Time: 8578-8555 PT Time Calculation (min) (ACUTE ONLY): 23 min  Charges:    $Therapeutic Activity: 23-37 mins PT General Charges $$ ACUTE PT VISIT: 1 Visit                    Dorothyann Maier, DPT, CLT  Acute Rehabilitation Services Office: [phone] (Secure chat preferred)    Dorothyann VEAR Maier 01/08/2025, 2:54 PM

## 2025-01-08 NOTE — TOC Progression Note (Addendum)
 Transition of Care [facility]) - Progression Note    Patient Details  Name: Zachary [name] MRN: [id number] Date of Birth: Jan 18, 1946  Transition of Care [facility]) CM/SW Contact  Lendia Dais, [place] Phone Number: 01/08/2025, 3:04 PM  Clinical Narrative:  CSW spoke to Tammy of [employer] and restarted insurance auth with an updated PT note. Tammy stated [employer] will call once the authorization has been approved.  38 - CSW attempted to contact pt wife via phone for an update. CSW left a VM. Pt's wife not at bedside.   Shara is pending. CSW will continue to follow.     Expected Discharge Plan: Skilled Nursing Facility Barriers to Discharge: Continued Medical Work up, English As A Second Language Teacher               Expected Discharge Plan and Services In-house Referral: Clinical Social Work     Living arrangements for the past 2 months: Single Family Home, Skilled Nursing Facility                                       Social Drivers of Health (SDOH) Interventions SDOH Screenings   Food Insecurity: No Food Insecurity (12/29/2024)  Housing: Low Risk (12/29/2024)  Transportation Needs: No Transportation Needs (12/29/2024)  Utilities: Not At Risk (12/29/2024)  Depression (PHQ2-9): High Risk (07/26/2023)  Social Connections: Moderately Isolated (12/29/2024)  Tobacco Use: Medium Risk (12/25/2024)    Readmission Risk Interventions    12/10/2024    9:44 AM 02/11/2023   12:02 PM  Readmission Risk Prevention Plan  Medication Screening Complete   Transportation Screening Complete Complete  PCP or Specialist Appt within 5-7 Days  Complete  Home Care Screening  Complete  Medication Review (RN CM)  Complete

## 2025-01-08 NOTE — TOC Progression Note (Signed)
 Transition of Care [facility]) - Progression Note    Patient Details  Name: Zachary [name] MRN: [id number] Date of Birth: [birth year]  Transition of Care [facility]) CM/SW Contact  Lendia Dais, [place] Phone Number: 01/08/2025, 12:11 PM  Clinical Narrative: Pt insurance shara was denied through a P2P. Per MD d/t the pt being less confused, insurance requested a new PT eval note to reevaluate the pt. CSW requested PT's via secure chat to pick up the pt when able for new auth to be submitted.  CSW pending PT eval for insurance auth.   Expected Discharge Plan: Skilled Nursing Facility Barriers to Discharge: Continued Medical Work up, English As A Second Language Teacher               Expected Discharge Plan and Services In-house Referral: Clinical Social Work     Living arrangements for the past 2 months: Single Family Home, Skilled Nursing Facility                                       Social Drivers of Health (SDOH) Interventions SDOH Screenings   Food Insecurity: No Food Insecurity (12/29/2024)  Housing: Low Risk (12/29/2024)  Transportation Needs: No Transportation Needs (12/29/2024)  Utilities: Not At Risk (12/29/2024)  Depression (PHQ2-9): High Risk (07/26/2023)  Social Connections: Moderately Isolated (12/29/2024)  Tobacco Use: Medium Risk (12/25/2024)    Readmission Risk Interventions    12/10/2024    9:44 AM 02/11/2023   12:02 PM  Readmission Risk Prevention Plan  Medication Screening Complete   Transportation Screening Complete Complete  PCP or Specialist Appt within 5-7 Days  Complete  Home Care Screening  Complete  Medication Review (RN CM)  Complete

## 2025-01-09 NOTE — TOC Progression Note (Signed)
 Transition of Care [facility]) - Progression Note    Patient Details  Name: Zachary [name] MRN: [id number] Date of Birth: 1946/06/25  Transition of Care [facility]) CM/SW Contact  Lendia Dais, [place] Phone Number: 01/09/2025, 8:53 AM  Clinical Narrative: CSW spoke to pt's spouse via phone and gave an update of insurance auth being resubmitted d/t insurance requesting an updated PT note. Mrs Joost asked about other options stating that she is unable to care for the pt at home d/t physical restraints.   CSW informed her that other options would be home w/ HH or OPPT. CSW stated that they would seek for other options as well in case insurance denies the approval again. Mrs. Ofarrell was agreeable.  CSW will continue to follow.    Expected Discharge Plan: Skilled Nursing Facility Barriers to Discharge: Continued Medical Work up, English As A Second Language Teacher               Expected Discharge Plan and Services In-house Referral: Clinical Social Work     Living arrangements for the past 2 months: Single Family Home, Skilled Nursing Facility                                       Social Drivers of Health (SDOH) Interventions SDOH Screenings   Food Insecurity: No Food Insecurity (12/29/2024)  Housing: Low Risk (12/29/2024)  Transportation Needs: No Transportation Needs (12/29/2024)  Utilities: Not At Risk (12/29/2024)  Depression (PHQ2-9): High Risk (07/26/2023)  Social Connections: Moderately Isolated (12/29/2024)  Tobacco Use: Medium Risk (12/25/2024)    Readmission Risk Interventions    12/10/2024    9:44 AM 02/11/2023   12:02 PM  Readmission Risk Prevention Plan  Medication Screening Complete   Transportation Screening Complete Complete  PCP or Specialist Appt within 5-7 Days  Complete  Home Care Screening  Complete  Medication Review (RN CM)  Complete

## 2025-01-09 NOTE — Progress Notes (Addendum)
 Spoke with Dr. Janit, approved for short term SNF J[facility]

## 2025-01-09 NOTE — Plan of Care (Signed)

## 2025-01-09 NOTE — Progress Notes (Signed)
 Palliative:  HPI: 79 y.o. 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 Required intubation 8/12.   I met today with 3 daughters, sister, brother, and Inge Lecher NP PCCM. We had discussion regarding path forward for one way extubation. We reviewed poor prognosis and expectation that JD will not do well once extubated. Family reiterate goal that he not suffer. We reviewed option to have medication available if he is having distress vs having medication already onboard to prevent distress. Family would like to have medication onboard to minimize any suffering during transition off ventilator. They would like to move forward with extubation later today and will have the rest of the family come to visit prior to extubation.   I reviewed plans and symptom management recommendations with RN.   Update: I was present with family prior to additional support as well as throughout extubation. Family appropriately tearful. Assisted to ensure comfort. I left family to visit privately with JD after he is resting comfortably after extubation.   All questions/concerns addressed. Emotional support provided. Much time coordinating care with RN, PCCM, and family.   Exam: Sedated on vent. FiO2 50%. Breathing regular, unlabored on vent. No distress. Not following commands. Abd soft. Warm to touch.   Plan:  - DNR - Extubated to full comfort care - Anticipate hospital death  100 min  Bernarda Kitty, NP Palliative Medicine Team Pager [phone] (Please see [email or web address] for schedule) Team Phone [phone]

## 2025-01-09 NOTE — TOC Progression Note (Addendum)
 Transition of Care [facility]) - Progression Note    Patient Details  Name: Zachary [name] MRN: [id number] Date of Birth: 05-May-1946  Transition of Care [facility]) CM/SW Contact  Lendia Dais, [place] Phone Number: 01/09/2025, 4:39 PM  Clinical Narrative:  Tammy of [employer] informed the CSW of P2P requested by insurance company. MD notified via secure chat. Phone number [phone] due my 1PM tomorrow.  Ambulance auth approved auth ID number [id number]. Good for 90 days.  CSW will continue to monitor.    Expected Discharge Plan: Skilled Nursing Facility Barriers to Discharge: Continued Medical Work up, English As A Second Language Teacher               Expected Discharge Plan and Services In-house Referral: Clinical Social Work     Living arrangements for the past 2 months: Single Family Home, Skilled Nursing Facility                                       Social Drivers of Health (SDOH) Interventions SDOH Screenings   Food Insecurity: No Food Insecurity (12/29/2024)  Housing: Low Risk (12/29/2024)  Transportation Needs: No Transportation Needs (12/29/2024)  Utilities: Not At Risk (12/29/2024)  Depression (PHQ2-9): High Risk (07/26/2023)  Social Connections: Moderately Isolated (12/29/2024)  Tobacco Use: Medium Risk (12/25/2024)    Readmission Risk Interventions    12/10/2024    9:44 AM 02/11/2023   12:02 PM  Readmission Risk Prevention Plan  Medication Screening Complete   Transportation Screening Complete Complete  PCP or Specialist Appt within 5-7 Days  Complete  Home Care Screening  Complete  Medication Review (RN CM)  Complete

## 2025-01-09 NOTE — Progress Notes (Signed)
" °   01/09/25 2054  BiPAP/CPAP/SIPAP  BiPAP/CPAP/SIPAP  (home cpap)  Mask Type Full face mask  FiO2 (%) 21 %  Patient Home Machine Yes  Safety Check Completed by RT for Home Unit Yes, no issues noted  Patient Home Mask Yes  Patient Home Tubing Yes  Auto Titrate Yes      Pt placed on home cpap. "

## 2025-01-09 NOTE — Progress Notes (Signed)
 " PROGRESS NOTE    Zachary [name]  FMW:[id number] DOB: January 04, 1946 DOA: 12/25/2024 PCP: Shona Norleen PEDLAR, MD    Brief Narrative:  51 yoM with PMH as below significant for HTN, HLD, CKD 3b, bladder cancer s/p multiple surgeries and TURBT (12/2022), low-grade papillary urethral carcinoma, urinary retention requiring intermittent catheterizations 3-4x/ day, pre-DM, OSA, chronic pain, nephrolithiasis, anxiety, and depression.  Recent hospitalization for T9 vertebral fracture due to mechanical fall requiring decompression and fusion of thoracic spine discharged to rehab on 12/23.  Returns back to the hospital for altered mental status hypotension, tachycardia and hypoxia.  Admitted to the ICU for septic shock secondary to UTI.  Underwent cystoscopy on 12/29 with right-sided ureteral stent placement.  Weaned off pressors on 12/30.  Initial antibiotics IV vancomycin  and Rocephin  was switched to p.o. Augmentin .  Seen by neurosurgery due to recent spinal surgery on 12/17.  Hardware appears to be intact.  MRI showing chronic changes along with postsurgical changes but no evidence of infection.  Patient's insurance denied SNF.  Patient has been seen by PT again and we are resubmitting authorization.   Assessment & Plan:  Septic shock secondary to urosepsis.  Pyonephrosis status post stent Leukocytosis - Urine cultures are growing E. coli, Klebsiella and Enterococcus faecalis.  Initially while in ICU antibiotics were narrowed to Augmentin  but due to worsening leukocytosis antibiotics were broadened to linezolid  and Zosyn .  Now transition to linezolid  and Unasyn -antibiotics stopped as treated for 15 days CT abdomen pelvis and right upper quadrant ultrasound shows distended gallbladder, gallstones and some sludge but no evidence of acute cholecystitis.  LFTs are overall normal.  Monitor fever curve-seems to be eating well Chronic Foley catheter, replaced during this admission  Vertebral fracture, thoracic spine  status post decompression/fusion Incisional drainage? - Recently performed on 12/17.  Routine pain control.  Seen by neurosurgery, Dr. Onetha.  Hardware appears to be intact. - MRI thoracic spine-shows lots of chronic changes and postsurgical changes but no obvious evidence of infection noted  Metabolic encephalopathy - Secondary to underlying infection.  Also has delirium.  Supportive measures -Delirium precaution  AKI on CKD 3B Hydronephrosis Urinary retention with chronic Foley catheter History of bladder cancer requiring previous TURBT -Continue Foley catheter.  Urology team is following.  Ureteroscopy planned in 2-3 weeks.  Per their service, no voiding trial necessary due to chronic catheter - Baseline creatinine 2.0, peaked at 3.9 during this admission now stable around 2.5 -will need outpatient follow up  Essential hypertension - Slowly resume home BP medications as he is able to tolerate.  IV as needed  Hyperlipidemia - Pravastatin   Major depressive disorder Delirium - Cymbalta .  Seen by psychiatry team.  As needed Ativan  and bedtime Remeron  has been added. - B12, TSH, ammonia, folate are normal Delirium precautions  Wound 12/25/24 1714 Pressure Injury Heel Left Deep Tissue Pressure Injury - Purple or maroon localized area of discolored intact skin or blood-filled blister due to damage of underlying soft tissue from pressure and/or shear. (Active)   Discussed with wife that if insurance does not approve skilled nursing facility she will need to come up with another plan-pay for long-term care, home with hospice?  PT/OT Palliative care team assisting with goals of care discussions.  DVT prophylaxis: heparin  injection 5,000 Units Start: 12/25/24 2200 SCDs Start: 12/25/24 1759      Code Status: Limited: Do not attempt resuscitation (DNR) -DNR-LIMITED -Do Not Intubate/DNI      PT Follow up Recs: Skilled Nursing-Short Term Rehab (<  3 Hours/Day)12/27/2024  1501  Subjective: Less confused today, feeding self breakfast  Examination:   General: Appearance:  Weak appearing male in no acute distress     Lungs:      respirations unlabored  Heart:    Normal heart rate.    MS:   All extremities are intact.   Neurologic: Awake and oriented to person, and place, situation is still not clear         Objective: Vitals:   01/08/25 2042 01/09/25 0451 01/09/25 0451 01/09/25 0741  BP: 139/74  132/74 (!) 145/87  Pulse: 93  92 99  Resp:    18  Temp:  97.6 F (36.4 C)  (!) 97.5 F (36.4 C)  TempSrc:  Axillary  Oral  SpO2: 97%  98% 100%  Weight:      Height:        Intake/Output Summary (Last 24 hours) at 01/09/2025 1020 Last data filed at 01/09/2025 0458 Gross per 24 hour  Intake 220 ml  Output 1500 ml  Net -1280 ml   Filed Weights   01/04/25 0415 01/05/25 0312 01/07/25 0500  Weight: 82.8 kg 81.2 kg 84.2 kg    Scheduled Meds:  acetaminophen   650 mg Oral TID   carvedilol   12.5 mg Oral BID   Chlorhexidine  Gluconate Cloth  6 each Topical Daily   dorzolamide -timolol   1 drop Both Eyes BID   DULoxetine   60 mg Oral BID   feeding supplement  237 mL Oral TID BM   heparin   5,000 Units Subcutaneous Q8H   latanoprost   1 drop Both Eyes QHS   lidocaine   1 patch Transdermal Q24H   magic mouthwash w/lidocaine   5 mL Oral TID   melatonin  3 mg Oral QHS   mirtazapine   7.5 mg Oral QHS   multivitamin with minerals  1 tablet Oral Daily   polyethylene glycol  17 g Oral Daily   pravastatin   10 mg Oral QHS   risperiDONE   0.5 mg Oral QHS   Continuous Infusions:    Nutritional status Signs/Symptoms: moderate muscle depletion, percent weight loss (11% weight loss within 1 month) Percent weight loss: 11 % Interventions: Ensure Enlive (each supplement provides 350kcal and 20 grams of protein), Liberalize Diet Body mass index is 24.16 kg/m.  Data Reviewed:   CBC: Recent Labs  Lab 01/03/25 0228 01/04/25 0206 01/05/25 0234 01/06/25 0248  01/08/25 0953  WBC 18.2* 15.2* 15.6* 13.2* 12.4*  HGB 11.7* 10.4* 10.8* 10.5* 10.7*  HCT 36.1* 33.5* 33.8* 33.1* 34.2*  MCV 89.4 91.3 91.4 90.2 91.4  PLT 392 380 381 412* 293   Basic Metabolic Panel: Recent Labs  Lab 01/03/25 0228 01/04/25 0206 01/05/25 0839 01/06/25 0248 01/08/25 0953  NA 140 138 141 139 140  K 4.4 4.4 4.5 4.5 4.2  CL 106 106 109 107 108  CO2 23 22 22 22 22   GLUCOSE 104* 100* 93 99 128*  BUN 42* 38* 31* 31* 32*  CREATININE 2.72* 2.53* 2.25* 2.43* 2.65*  CALCIUM 9.4 8.5* 8.8* 8.8* 8.6*   GFR: Estimated Creatinine Clearance: 26.4 mL/min (A) (by C-G formula based on SCr of 2.65 mg/dL (H)). Liver Function Tests: Recent Labs  Lab 01/06/25 0248  AST 24  ALT 27  ALKPHOS 125  BILITOT 0.2  PROT 6.3*  ALBUMIN  2.8*   No results for input(s): LIPASE, AMYLASE in the last 168 hours. Recent Labs  Lab 01/05/25 0234  AMMONIA 15   Coagulation Profile: No results for input(s): INR, PROTIME in  the last 168 hours. Cardiac Enzymes: No results for input(s): CKTOTAL, CKMB, CKMBINDEX, TROPONINI in the last 168 hours. BNP (last 3 results) Recent Labs    12/25/24 0922  PROBNP 2,105.0*   HbA1C: No results for input(s): HGBA1C in the last 72 hours. CBG: Recent Labs  Lab 01/06/25 2112 01/07/25 0104 01/07/25 0736 01/07/25 1159 01/07/25 1702  GLUCAP 127* 95 85 105* 101*   Lipid Profile: No results for input(s): CHOL, HDL, LDLCALC, TRIG, CHOLHDL, LDLDIRECT in the last 72 hours. Thyroid  Function Tests: No results for input(s): TSH, T4TOTAL, FREET4, T3FREE, THYROIDAB in the last 72 hours. Anemia Panel: No results for input(s): VITAMINB12, FOLATE, FERRITIN, TIBC, IRON, RETICCTPCT in the last 72 hours. Sepsis Labs: No results for input(s): PROCALCITON, LATICACIDVEN in the last 168 hours.  Recent Results (from the past 240 hours)  Culture, blood (Routine X 2) w Reflex to ID Panel     Status: None    Collection Time: 01/01/25  8:05 AM   Specimen: BLOOD RIGHT HAND  Result Value Ref Range Status   Specimen Description BLOOD RIGHT HAND  Final   Special Requests   Final    BOTTLES DRAWN AEROBIC AND ANAEROBIC Blood Culture adequate volume   Culture   Final    NO GROWTH 5 DAYS Performed at [facility], [address] [place], [place] [address]    Report Status 01/06/2025 FINAL  Final  Culture, blood (Routine X 2) w Reflex to ID Panel     Status: None   Collection Time: 01/01/25  8:12 AM   Specimen: BLOOD LEFT HAND  Result Value Ref Range Status   Specimen Description BLOOD LEFT HAND  Final   Special Requests   Final    BOTTLES DRAWN AEROBIC AND ANAEROBIC Blood Culture adequate volume   Culture   Final    NO GROWTH 5 DAYS Performed at [facility], [address], [place], [place] [address]    Report Status 01/06/2025 FINAL  Final         Radiology Studies: No results found.           LOS: 15 days   Time spent= 35 mins    Harlene RAYMOND Bowl, DO [facility]  If 7PM-7AM, please contact night-coverage  01/09/2025, 10:20 AM  "

## 2025-01-10 ENCOUNTER — Inpatient Hospital Stay (HOSPITAL_COMMUNITY)

## 2025-01-10 LAB — BASIC METABOLIC PANEL WITH GFR
Anion gap: 11 (ref 5–15)
BUN: 32 mg/dL — ABNORMAL HIGH (ref 8–23)
CO2: 22 mmol/L (ref 22–32)
Calcium: 8.9 mg/dL (ref 8.9–10.3)
Chloride: 109 mmol/L (ref 98–111)
Creatinine, Ser: 2.63 mg/dL — ABNORMAL HIGH (ref 0.61–1.24)
GFR, Estimated: 24 mL/min — ABNORMAL LOW
Glucose, Bld: 95 mg/dL (ref 70–99)
Potassium: 4.6 mmol/L (ref 3.5–5.1)
Sodium: 141 mmol/L (ref 135–145)

## 2025-01-10 LAB — CBC
HCT: 31.5 % — ABNORMAL LOW (ref 39.0–52.0)
Hemoglobin: 10 g/dL — ABNORMAL LOW (ref 13.0–17.0)
MCH: 28.6 pg (ref 26.0–34.0)
MCHC: 31.7 g/dL (ref 30.0–36.0)
MCV: 90 fL (ref 80.0–100.0)
Platelets: 262 K/uL (ref 150–400)
RBC: 3.5 MIL/uL — ABNORMAL LOW (ref 4.22–5.81)
RDW: 15.9 % — ABNORMAL HIGH (ref 11.5–15.5)
WBC: 9 K/uL (ref 4.0–10.5)
nRBC: 0 % (ref 0.0–0.2)

## 2025-01-10 MED ORDER — POLYETHYLENE GLYCOL 3350 17 G PO PACK: 17.0000 g | PACK | Freq: Every day | ORAL | Status: DC

## 2025-01-10 MED ORDER — OXYCODONE HCL 5 MG PO TABS: 5.0000 mg | ORAL_TABLET | Freq: Four times a day (QID) | ORAL | 0 refills | Status: DC | PRN

## 2025-01-10 MED ORDER — RISPERIDONE 0.5 MG PO TABS: 0.5000 mg | ORAL_TABLET | Freq: Every day | ORAL | Status: DC

## 2025-01-10 MED ORDER — LORAZEPAM 0.5 MG PO TABS: 0.5000 mg | ORAL_TABLET | Freq: Every day | ORAL | 0 refills | Status: DC | PRN

## 2025-01-10 MED ORDER — METHOCARBAMOL 500 MG PO TABS: 500.0000 mg | ORAL_TABLET | Freq: Three times a day (TID) | ORAL | Status: DC | PRN

## 2025-01-10 MED ORDER — MIRTAZAPINE 7.5 MG PO TABS: 7.5000 mg | ORAL_TABLET | Freq: Every day | ORAL | Status: DC

## 2025-01-10 MED ORDER — MAGIC MOUTHWASH W/LIDOCAINE: 10.0000 mL | Freq: Three times a day (TID) | ORAL | Status: DC | PRN

## 2025-01-10 MED ORDER — MELATONIN 3 MG PO TABS: 3.0000 mg | ORAL_TABLET | Freq: Every day | ORAL | Status: DC

## 2025-01-10 MED ORDER — LIDOCAINE 5 % EX PTCH: 1.0000 | MEDICATED_PATCH | CUTANEOUS | Status: DC

## 2025-01-10 MED ORDER — CARVEDILOL 12.5 MG PO TABS: 12.5000 mg | ORAL_TABLET | Freq: Two times a day (BID) | ORAL | Status: DC

## 2025-01-10 NOTE — Discharge Summary (Addendum)
 " Physician Discharge Summary   Patient: Zachary [name] MRN: [id number] DOB: 1946/02/07  Admit date:     12/25/2024  Discharge date: 01/10/2025  Discharge Physician: Nydia Distance, MD    PCP: Shona Norleen PEDLAR, MD   Recommendations at discharge:   Delirium, fall precaution Chronic Foley catheter replaced during this admission.  Outpatient follow-up with urology in 2 weeks palliative to follow outpatient   Discharge Diagnoses:    Septic shock (HCC) secondary to urosepsis/acute pyelonephrosis status post stent UTI in the setting of chronic Foley catheter   AKI (acute kidney injury) superimposed on CKD stage IIIb Urine retention with chronic Foley catheter, hydronephrosis History of bladder CA  Acute metabolic encephalopathy/delirium resolved.     Adjustment disorder with mixed anxiety and depressed mood   Pressure injury of skin   Protein-calorie malnutrition, severe Vertebral fracture, thoracic spine status post decompression/fusion Essential hypertension  Hospital Course:  Patient is a 79 yoM with HTN, HLD, CKD 3b, bladder cancer s/p multiple surgeries and TURBT (12/2022), low-grade papillary urethral carcinoma, urinary retention requiring intermittent catheterizations 3-4x/ day, pre-DM, OSA, chronic pain, nephrolithiasis, anxiety, and depression. Recent hospitalization for T9 vertebral fracture due to mechanical fall requiring decompression and fusion of thoracic spine discharged to rehab on 12/23. Returned back to the hospital for altered mental status hypotension, tachycardia and hypoxia. Admitted to the ICU for septic shock secondary to UTI. Underwent cystoscopy on 12/29 with right-sided ureteral stent placement. Weaned off pressors on 12/30. Initial antibiotics IV vancomycin  and Rocephin  was switched to p.o. Augmentin . Seen by neurosurgery due to recent spinal surgery on 12/17. Hardware appears to be intact. MRI showing chronic changes along with postsurgical changes but no evidence of  infection.    Assessment and Plan:   Septic shock secondary to urosepsis.  Pyonephrosis status post stent Leukocytosis - Urine cultures positive for E. coli, Klebsiella and Enterococcus faecalis.  Initially while in ICU antibiotics were narrowed to Augmentin  but due to worsening leukocytosis, antibiotics were broadened to linezolid  and Zosyn .  -Patient has completed antibiotic course -antibiotics stopped as treated for 15 days -CT abdomen pelvis and right upper quadrant ultrasound shows distended gallbladder, gallstones and some sludge but no evidence of acute cholecystitis.  LFTs are overall normal.   -Chronic Foley catheter, replaced during this admission   Vertebral fracture, thoracic spine status post decompression/fusion Incisional drainage? - Recently performed on 12/17.  Routine pain control.  Seen by neurosurgery, Dr. Onetha.  Hardware appears to be intact. - MRI thoracic spine-shows chronic changes and postsurgical changes but no obvious evidence of infection noted   Acute metabolic encephalopathy/delirium - Secondary to underlying infection, improved -CT head 11/14 for transient lethargy showed no acute intracranial abnormality, moderate chronic ischemic microvascular disease.  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 -Delirium precautions   AKI on CKD 3B Hydronephrosis Urinary retention with chronic Foley catheter History of bladder cancer requiring previous TURBT -Continue Foley catheter, replaced during this admission.  Seen by urology service, ureteroscopy planned in 2-3 weeks.  Per their service, no voiding trial necessary due to chronic catheter - Baseline creatinine 2.0, peaked at 3.9 during this admission,  now stable around 2.5-2.6 -will need outpatient follow up with PCP, check BMP   Essential hypertension - Continue Coreg  12.5 mg twice daily, valsartan  on hold due to AKI.   Hyperlipidemia - Pravastatin    Major depressive disorder - Seen  by psychiatry team, continue Remeron  7.5 mg nightly, Cymbalta , Ativan  0.5 mg p.o. daily as needed  for acute anxiety - B12, TSH, ammonia, folate are normal Delirium precautions   Wound 12/25/24 1714 Pressure Injury Heel Left Deep Tissue Pressure Injury - Purple or maroon localized area of discolored intact skin or blood-filled blister due to damage of underlying soft tissue from pressure and/or shear. (Active)         Pain control - [place]  Controlled Substance Reporting System database was reviewed. and patient was instructed, not to drive, operate heavy machinery, perform activities at heights, swimming or participation in water  activities or provide baby-sitting services while on Pain, Sleep and Anxiety Medications; until their outpatient Physician has advised to do so again. Also recommended to not to take more than prescribed Pain, Sleep and Anxiety Medications.  Consultants: PCCM, psychiatry, urology, palliative medicine, neurosurgery Procedures performed:   Disposition: Skilled nursing facility Diet recommendation:  Discharge Diet Orders (From admission, onward)     Start     Ordered   01/10/25 0000  Diet general        01/10/25 1149            DISCHARGE MEDICATION: Allergies as of 01/10/2025       Reactions   Poison Oak Extract Hives, Itching   Sulfa Antibiotics Other (See Comments)   Unknown    Sulfonamide Derivatives Itching, Rash        Medication List     STOP taking these medications    valsartan  320 MG tablet Commonly known as: DIOVAN        TAKE these medications    acetaminophen  500 MG tablet Commonly known as: TYLENOL  Take 500 mg by mouth every 8 (eight) hours as needed (pain level 1-3/ headache/fever).   carvedilol  12.5 MG tablet Commonly known as: COREG  Take 1 tablet (12.5 mg total) by mouth 2 (two) times daily. What changed:  medication strength how much to take   dorzolamide -timolol  2-0.5 % ophthalmic solution Commonly known as:  COSOPT  Place 1 drop into both eyes 2 (two) times daily.   DULoxetine  60 MG capsule Commonly known as: CYMBALTA  Take 1 capsule (60 mg total) by mouth 2 (two) times daily.   Fish Oil 1000 MG Caps Take 1 capsule by mouth daily.   latanoprost  0.005 % ophthalmic solution Commonly known as: XALATAN  Place 1 drop into both eyes at bedtime.   lidocaine  5 % Commonly known as: LIDODERM  Place 1 patch onto the skin daily. Apply to back. Apply to intact skin to cover the most painful area.   LORazepam  0.5 MG tablet Commonly known as: ATIVAN  Take 1 tablet (0.5 mg total) by mouth daily as needed for anxiety.   magic mouthwash w/lidocaine  Soln Take 10 mLs by mouth 3 (three) times daily as needed for mouth pain. Suspension contains equal amounts of Maalox Extra Strength, nystatin , diphenhydramine  and lidocaine .   melatonin 3 MG Tabs tablet Take 1 tablet (3 mg total) by mouth at bedtime.   methocarbamol  500 MG tablet Commonly known as: ROBAXIN  Take 1 tablet (500 mg total) by mouth every 8 (eight) hours as needed for muscle spasms. What changed:  when to take this reasons to take this   mirtazapine  7.5 MG tablet Commonly known as: REMERON  Take 1 tablet (7.5 mg total) by mouth at bedtime.   multivitamin tablet Take 1 tablet by mouth daily.   oxyCODONE  5 MG immediate release tablet Commonly known as: Oxy IR/ROXICODONE  Take 1 tablet (5 mg total) by mouth every 6 (six) hours as needed for severe pain (pain score 7-10). What changed:  medication  strength how much to take reasons to take this   polyethylene glycol 17 g packet Commonly known as: MIRALAX  / GLYCOLAX  Take 17 g by mouth daily. Start taking on: January 11, 2025   pravastatin  10 MG tablet Commonly known as: PRAVACHOL  Take 10 mg by mouth at bedtime.   PRESERVISION AREDS PO Take 1 capsule by mouth in the morning and at bedtime.   PROSOURCE PO Take 30 mLs by mouth 3 (three) times daily.   risperiDONE  0.5 MG  tablet Commonly known as: RISPERDAL  Take 1 tablet (0.5 mg total) by mouth at bedtime.               Discharge Care Instructions  (From admission, onward)           Start     Ordered   01/10/25 0000  Discharge wound care:       Comments: Wound care  Daily      Comments: Apply a piece of Aquacel to posterior back wounds Q day, and cover with foam dressing.  Change foam dressing Q 3 days or PRN soiling.  Moisten Aquacel with NS each time to assist with removal.    Foam dressing  Every 3 days     Comments: Heel/achilles wound; assess for acute changes Q shift.   01/10/25 1149            Contact information for follow-up providers     Shona Norleen PEDLAR, MD. Schedule an appointment as soon as possible for a visit in 2 week(s).   Specialty: Internal Medicine Why: for hospital follow-up Contact information: [address] [place] [facility] [address] [phone]         Watt Norleen, MD. Schedule an appointment as soon as possible for a visit in 2 week(s).   Specialty: Urology Why: for hospital follow-up Contact information: [address] [place] [address] [phone]              Contact information for after-discharge care     Destination     [facility] .   Service: Skilled Nursing Contact information: [address] [place]  [address] [phone]                    Discharge Exam: Filed Weights   01/04/25 0415 01/05/25 0312 01/07/25 0500  Weight: 82.8 kg 81.2 kg 84.2 kg   S: This morning, was feeling somewhat lethargic and dizzy.  Seen and examined again, and feeling back to baseline, no acute complaints.  No nausea vomiting, chest pain or shortness of breath, fevers   BP (!) 140/75 (BP Location: Right Arm)   Pulse (!) 109   Temp 98.3 F (36.8 C) (Oral)   Resp 18   Ht 6' 1.5 (1.867 m)   Wt 84.2 kg   SpO2 96%   BMI 24.16 kg/m   Physical Exam General: Alert and oriented x 3, NAD Cardiovascular:  S1 S2 clear, RRR.  Respiratory: CTAB, no wheezing Gastrointestinal: Soft, nontender, nondistended, NBS Ext: no pedal edema bilaterally Neuro: no new deficits Psych: Normal affect    Condition at discharge: fair  The results of significant diagnostics from this hospitalization (including imaging, microbiology, ancillary and laboratory) are listed below for reference.   Imaging Studies: CT HEAD WO CONTRAST ( ) Result Date: 01/10/2025 EXAM: CT HEAD WITHOUT CONTRAST 01/10/2025 10:16:30 AM TECHNIQUE: CT of the head was performed without the administration of intravenous contrast. Automated exposure control, iterative reconstruction, and/or weight based  adjustment of the mA/kV was utilized to reduce the radiation dose to as low as reasonably achievable. COMPARISON: 12/25/2024 CLINICAL HISTORY: The patient presents with a mental status change of unknown cause. FINDINGS: BRAIN AND VENTRICLES: No acute hemorrhage. No evidence of acute infarct. Proportional prominence of ventricles and sulci, consistent with diffuse cerebral parenchymal volume loss. Periventricular and subcortical white matter hypoattenuation, consistent with moderate chronic ischemic microvascular disease. Remote lacunar infarcts in left caudate and right thalamus. No hydrocephalus. No extra-axial collection. No mass effect or midline shift. ORBITS: Bilateral lens replacement. No acute abnormality. SINUSES: No acute abnormality. SOFT TISSUES AND SKULL: Calcified atherosclerotic plaque in cavernous/supraclinoid internal carotid arteries. No acute soft tissue abnormality. No skull fracture. IMPRESSION: 1. No acute intracranial abnormality. 2. Moderate chronic ischemic microvascular disease. 3. Remote lacunar infarcts in left caudate and right thalamus. Electronically signed by: Donnice Mania MD 01/10/2025 11:06 AM EST RP Workstation: [id number]   DG CHEST PORT 1 VIEW Result Date: 01/06/2025 EXAM: 1 VIEW(S) XRAY OF THE CHEST 01/06/2025  06:09:00 PM COMPARISON: 12/30/2024 CLINICAL HISTORY: Dyspnea FINDINGS: LUNGS AND PLEURA: Bibasilar atelectasis again noted. No pleural effusion. No pneumothorax. HEART AND MEDIASTINUM: No acute abnormality of the cardiac and mediastinal silhouettes. BONES AND SOFT TISSUES: Thoracic spinal fusion hardware present. Old left rib fractures. IMPRESSION: 1. No acute cardiopulmonary abnormality. Electronically signed by: Pinkie Pebbles MD MD 01/06/2025 11:39 PM EST RP Workstation: [id number]   MR THORACIC SPINE W WO CONTRAST Result Date: 01/02/2025 EXAM: MR Thoracic Spine without 12/17/last year TECHNIQUE: Multiplanar multisequence MRI of the thoracic spine was performed without the administration of intravenous contrast. COMPARISON: CT chest, abdomen, and pelvis 12/29/last year, preoperative thoracic spine MRI 12/15/last year. CLINICAL HISTORY: 79 year old male, postoperative T7-T11 posterior fixation for T9 Chance fracture in ankylosed spine. Query infection. FINDINGS: Exam was discontinued prior to completion as the patient had difficulty tolerating. Sagittal precontrast and T2 weighted only axial images are provided. Normal thoracic spine segmentation on the comparison CT. BONES AND ALIGNMENT: Hardware susceptibility artifact at the T7 through T11 levels as expected. Underlying hyperostosis related thoracic spinal ankylosis. Unhealed Chance type fracture at T9. Associated fluid, which might be seroma, in the fracture cleft (series 9 images 11 through 14). Confluent superimposed vertebral body edema at T8 and T9. Stable vertebral height from the recent CT. Normal background bone marrow signal. No other marrow edema when allowing for hardware susceptibility. Incidental L2 benign vertebral hemangioma. SPINAL CORD: Normal spinal cord volume. No thoracic spinal cord signal abnormality identified. The conus medullaris appears normal at L1. SOFT TISSUES: Superimposed dependent bilateral pleural fluid and/or pulmonary  atelectasis or consolidation. No convincing paraspinal phlegmon or inflammation at that level. 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 T7-T8: Posterior spinal fusion hardware. Similar epidural lipomatosis and degenerative posterior element hypertrophy. T9-T10: Posterior decompression of this level. Laminectomy fluid collection measuring about 23 x 23 x 24 mm in certain dimensions in certain volumes is favored to be postoperative seroma, with only minimal mass effect on the posterior thecal sac (series 10 image 30). T9-T10 posterior spinal hardware. Epidural lipomatosis and degenerative posterior element hypertrophy similar to other levels. T10 through thoracolumbar junction: Interbody ankylosis by CT. The dorsal epidural lipomatosis and posterior element hypertrophy abates at T11-T12. IMPRESSION: 1. Truncated exam; difficult for patient to tolerate. Non-contrast images only and only T2 weighted  axial images. 2. Posterior spinal fusion and Unhealed chance-type fracture at T9 in the otherwise ankylosed spine. No adverse features identified; favor small volume seroma in both the fracture cleft and the laminectomy space (6 mL). No convincing spinal infection based on these images. 3. Dependent bilateral pleural fluid, atelectasis and/or consolidation. Electronically signed by: Helayne Hurst MD 01/02/2025 11:23 AM EST RP Workstation: [id number]   US  Abdomen Limited RUQ (LIVER/GB) Result Date: 12/30/2024 EXAM: Right Upper Quadrant Abdominal Ultrasound 12/30/2024 08:59:56 PM TECHNIQUE: Real-time ultrasonography of the right upper quadrant of the abdomen was performed. COMPARISON: CT abdomen and pelvis 12/30/2024. CLINICAL HISTORY: Gallbladder dilatation. FINDINGS: LIVER: Normal echogenicity. No intrahepatic biliary ductal  dilatation. No evidence of mass. Hepatopetal flow in the portal vein. BILIARY SYSTEM: Multiple stones and echogenic sludge within the gallbladder. No pericholecystic fluid or wall thickening. Murphy sign is negative. Bile ducts are not well visualized due to overlying bowel gas, but no ductal dilatation is identified. Unable to specifically measure the common bile duct. RIGHT KIDNEY: No hydronephrosis. No echogenic calculi. Incidental note of subcentimeter cysts on the right kidney. No imaging follow-up is indicated. PANCREAS: Visualized portions of the pancreas are unremarkable. OTHER: No right upper quadrant ascites. IMPRESSION: 1. Multiple stones and tumefactive sludge within the gallbladder without sonographic evidence of acute cholecystitis. 2. Bile ducts are not well visualized due to overlying bowel gas, but no ductal dilatation is identified; the common bile duct cannot be specifically measured. Electronically signed by: Elsie Gravely MD 12/30/2024 09:03 PM EST RP Workstation: [id number]   CT ABDOMEN PELVIS WO CONTRAST Result Date: 12/30/2024 CLINICAL DATA:  Sepsis, distended gallbladder on recent CT. EXAM: CT ABDOMEN AND PELVIS WITHOUT CONTRAST TECHNIQUE: Multidetector CT imaging of the abdomen and pelvis was performed following the standard protocol without IV contrast. RADIATION DOSE REDUCTION: This exam was performed according to the departmental dose-optimization program which includes automated exposure control, adjustment of the mA and/or kV according to patient size and/or use of iterative reconstruction technique. COMPARISON:  CT chest 12/25/2024. FINDINGS: Lower chest: Small bilateral pleural effusions with collapse/consolidation in both lower lobes. Atherosclerotic calcification of the aorta and right coronary artery. Heart is enlarged. No pericardial effusion. Distal esophagus is grossly unremarkable. Hepatobiliary: Patient's arms create streak artifact in the abdomen, degrading image  quality. Liver is enlarged, 19.6 cm. Liver is otherwise grossly unremarkable. There may be dependent sludge or stones in the gallbladder versus streak artifact. No biliary ductal dilatation. Pancreas: Negative. Spleen: Negative. Adrenals/Urinary Tract: Slight nodular thickening of the body of the right adrenal gland. No specific follow-up necessary. Low-attenuation lesions in the right kidney. No specific follow-up necessary. Scarring in the kidneys bilaterally. Double-J right ureteral stent with the proximal loop formed in the right intrarenal collecting system and distal loop formed in the bladder. Previously seen 4 mm lower right ureteral stone appears to still be present within the lower right ureter (3/75). Punctate left renal stones. Ureters are decompressed. Foley catheter and air are seen in a thick-walled bladder. Stomach/Bowel: Stomach, small bowel, appendix and colon are unremarkable. Vascular/Lymphatic: Atherosclerotic calcification of the aorta. No pathologically enlarged lymph nodes. 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 Reproductive: Prostate is visualized. Other: Small bilateral inguinal hernias contain fat. No free fluid. Tiny umbilical hernia contains fat. Musculoskeletal: Hip screw and intramedullary rod in the proximal left femur. Osteopenia. Degenerative changes in the spine. At least partially fused sacroiliac joints. Partially imaged T8-T11 posterior fusion. L3-L5 posterior lumbar interbody fusion with  interbody cages. IMPRESSION: 1. Small bilateral pleural effusions with bibasilar collapse/consolidation. Difficult to exclude pneumonia. 2. Sludge and/or stone debris in the bladder versus streak artifact from the patient's right arm. Consider right upper quadrant ultrasound, as clinically necessary. 3. Hepatomegaly. 4. Double-J right ureteral stent in place. Previously seen 4 mm stone appears to still be within the lower right ureter  (3/75). No hydronephrosis. 5. Punctate left renal stones. 6. Aortic atherosclerosis (ICD10-I70.0). Coronary artery calcification. 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 Electronically Signed   By: Newell Eke M.D.   On: 12/30/2024 15:44   DG Chest Port 1 View Result Date: 12/30/2024 CLINICAL DATA:  Dyspnea EXAM: PORTABLE CHEST 1 VIEW COMPARISON:  Four days ago FINDINGS: Stable cardiomediastinal silhouette. Right lung is clear. Minimal left basilar subsegmental atelectasis or scarring is noted. Status post surgical posterior fusion of lower thoracic spine. IMPRESSION: Minimal left basilar subsegmental atelectasis or scarring. Electronically Signed   By: Lynwood Landy Raddle M.D.   On: 12/30/2024 14:16   DG C-Arm 1-60 Min Result Date: 12/26/2024 CLINICAL DATA:  Cystoscopy with retrograde pyelogram and ureteral stent insertion. EXAM: DG C-ARM 1-60 MIN CONTRAST:  Not provided FLUOROSCOPY: Fluoroscopy Time:  34.1 seconds Radiation Exposure Index (if provided by the fluoroscopic device): 10.83 mGy Number of Acquired Spot Images: 4 COMPARISON:  CT earlier today FINDINGS: Four fluoroscopic spot views submitted from the operating/procedure rim. Imaging obtained during retrograde pyelogram and right ureteral stent placement. IMPRESSION: Procedural fluoroscopy, please reference operative report for details. Electronically Signed   By: Andrea Gasman M.D.   On: 12/26/2024 15:35   ECHOCARDIOGRAM COMPLETE Result Date: 12/26/2024    ECHOCARDIOGRAM REPORT   Patient Name:   VIOLA PLACERES Date of Exam: 12/26/2024 Medical Rec #:  [id number]     Height:       73.5 in Accession #:    [id number]    Weight:       201.0 lb Date of Birth:  04/17/1946    BSA:          2.167 m Patient Age:    78 years      BP:           131/68 mmHg Patient Gender: M             HR:           81 bpm. Exam Location:  Inpatient Procedure: 2D Echo, Cardiac Doppler and Color Doppler (Both Spectral and Color            Flow Doppler  were utilized during procedure). Indications:    Septic shock  History:        Patient has prior history of Echocardiogram examinations.                 Signs/Symptoms:Bacteremia; Risk Factors:Sleep Apnea,                 Hypertension and Dyslipidemia.  Sonographer:    Ellouise Mose RDCS Referring Phys: [id number] PAULA B SIMPSON  Sonographer Comments: Image acquisition challenging due to uncooperative patient. Patient started screaming during apicals. Could do a complete study. IMPRESSIONS  1. Unable to fully assess LV or diastology due to patient screaming. No clear wall motion abnormalities. Left ventricular ejection fraction, by estimation, is 50 to 55%. The left ventricle has low normal function. Left ventricular endocardial border not  optimally defined to evaluate regional wall motion. Left ventricular diastolic parameters are consistent with Grade I diastolic dysfunction (impaired relaxation).  2. Right ventricular systolic function is normal. The right ventricular size is normal.  3. The mitral valve is normal in structure. Trivial mitral valve regurgitation. No evidence of mitral stenosis.  4. The aortic valve is tricuspid. There is mild thickening of the aortic valve. Aortic valve regurgitation is mild. Aortic valve sclerosis is present, with no evidence of aortic valve stenosis. FINDINGS  Left Ventricle: Unable to fully assess LV or diastology due to patient screaming. No clear wall motion abnormalities. Left ventricular ejection fraction, by estimation, is 50 to 55%. The left ventricle has low normal function. Left ventricular endocardial border not optimally defined to evaluate regional wall motion. The left ventricular internal cavity size was normal in size. There is borderline left ventricular hypertrophy. Left ventricular diastolic parameters are consistent with Grade I diastolic dysfunction (impaired relaxation). Right Ventricle: The right ventricular size is normal. No increase in right ventricular wall  thickness. Right ventricular systolic function is normal. Left Atrium: Left atrial size was normal in size. Right Atrium: Right atrial size was not well visualized. Pericardium: There is no evidence of pericardial effusion. Mitral Valve: The mitral valve is normal in structure. Trivial mitral valve regurgitation. No evidence of mitral valve stenosis. Tricuspid Valve: The tricuspid valve is normal in structure. Tricuspid valve regurgitation is trivial. No evidence of tricuspid stenosis. Aortic Valve: The aortic valve is tricuspid. There is mild thickening of the aortic valve. Aortic valve regurgitation is mild. Aortic valve sclerosis is present, with no evidence of aortic valve stenosis. Pulmonic Valve: The pulmonic valve was normal in structure. Pulmonic valve regurgitation is trivial. No evidence of pulmonic stenosis. Aorta: The aortic root and ascending aorta are structurally normal, with no evidence of dilitation. IAS/Shunts: No atrial level shunt detected by color flow Doppler.  LEFT VENTRICLE PLAX 2D LVIDd:         4.50 cm LVIDs:         3.60 cm LV PW:         1.30 cm LV IVS:        1.20 cm LVOT diam:     2.40 cm LVOT Area:     4.52 cm  RIGHT VENTRICLE          IVC RVOT diam:      3.10 cm  IVC diam: 2.10 cm LEFT ATRIUM           Index LA diam:      3.70 cm 1.71 cm/m LA Vol (A4C): 36.6 ml 16.89 ml/m   AORTA Ao Root diam: 3.60 cm Ao Asc diam:  3.20 cm MITRAL VALVE MV Area (PHT): 3.99 cm    SHUNTS MV Decel Time: 190 msec    Systemic Diam: 2.40 cm MV E velocity: 57.50 cm/s  Pulmonic Diam: 3.10 cm MV A velocity: 76.20 cm/s MV E/A ratio:  0.75 Morene Brownie Electronically signed by Morene Brownie Signature Date/Time: 12/26/2024/11:54:38 AM    Final    DG Abdomen 1 View Result Date: 12/25/2024 EXAM: 1 VIEW XRAY OF THE ABDOMEN 12/25/2024 03:36:11 PM COMPARISON: None available. CLINICAL HISTORY: s/p central line FINDINGS: LINES, TUBES AND DEVICES: Right femoral central venous catheter in place with tip  overlying the expected region of the IVC. Foley catheter in place overlying the pelvis. BOWEL: Few mildly dilated loops of small bowel in the left hemiabdomen consistent with possible ileus or early small bowel obstruction. SOFT TISSUES: No abnormal calcifications. BONES: Partially visualized left femoral neck fixation screw noted (comparison pelvis x-ray 02/09/2023). Postsurgical changes of the lower  lumbar spine. No acute fracture. JOINTS: Bilateral hip osteoarthritis. DISCS/DEGENERATIVE CHANGES: Multilevel degenerative changes of the spine. IMPRESSION: 1. Right femoral central venous catheter with the tip overlying the expected region of the IVC. 2. Few mildly dilated loops of small bowel in the left hemiabdomen, which may reflect ileus or early small bowel obstruction. Electronically signed by: Greig Pique MD 12/25/2024 05:38 PM EST RP Workstation: [id number]   CT CHEST ABDOMEN PELVIS WO CONTRAST Result Date: 12/25/2024 EXAM: CT CHEST, ABDOMEN AND PELVIS WITHOUT CONTRAST 12/25/2024 12:06:43 PM TECHNIQUE: CT of the chest, abdomen and pelvis was performed without the administration of intravenous contrast. Multiplanar reformatted images are provided for review. Automated exposure control, iterative reconstruction, and/or weight based adjustment of the mA/kV was utilized to reduce the radiation dose to as low as reasonably achievable. COMPARISON: 07/05/2023. CLINICAL HISTORY: Sepsis. FINDINGS: CHEST: MEDIASTINUM AND LYMPH NODES: Heart and pericardium are unremarkable. Calcifications within the left anterior descending and right coronary arteries. Aortic calcifications. The central airways are clear. No mediastinal, hilar or axillary lymphadenopathy. LUNGS AND PLEURA: Small bilateral pleural effusions. Bibasilar atelectasis. No focal consolidation or pulmonary edema. No pneumothorax. ABDOMEN AND PELVIS: LIVER: The liver is unremarkable. GALLBLADDER AND BILE DUCTS: Gallbladder moderately distended. No visible  stones or inflammatory changes. This could be further evaluated with right upper quadrant ultrasound if felt clinically indicated. No biliary ductal dilatation. SPLEEN: No acute abnormality. PANCREAS: No acute abnormality. ADRENAL GLANDS: No acute abnormality. KIDNEYS, URETERS AND BLADDER: Moderate right hydronephrosis due to 4 mm distal right ureteral stone. Foley catheter in the bladder, which is decompressed. No stones in the left kidney or ureter. No perinephric or periureteral stranding. GI AND BOWEL: Stomach demonstrates no acute abnormality. Scattered colonic diverticulosis. No active diverticulitis. There is no bowel obstruction. REPRODUCTIVE ORGANS: No acute abnormality. PERITONEUM AND RETROPERITONEUM: No ascites. No free air. VASCULATURE: Aorta is normal in caliber. Aortic atherosclerosis. ABDOMINAL AND PELVIS LYMPH NODES: No lymphadenopathy. BONES AND SOFT TISSUES: Postoperative changes in the thoracic spine, lumbar spine and left hip. T9 fracture again noted as seen on prior CT and MRI. No focal soft tissue abnormality. IMPRESSION: 1. Moderate right hydronephrosis due to a 4 mm distal right ureteral stone. 2. Moderately distended gallbladder without visible stones or inflammatory changes; consider right upper quadrant ultrasound if indicated. 3. Small bilateral pleural effusions and bibasilar atelectasis. Electronically signed by: Franky Crease MD 12/25/2024 01:47 PM EST RP Workstation: [id number]   CT Head Wo Contrast Result Date: 12/25/2024 EXAM: CT HEAD WITHOUT CONTRAST 12/25/2024 12:06:43 PM TECHNIQUE: CT of the head was performed without the administration of intravenous contrast. Automated exposure control, iterative reconstruction, and/or weight based adjustment of the mA/kV was utilized to reduce the radiation dose to as low as reasonably achievable. COMPARISON: Head CT 12/09/2024 and MRI 07/19/2021. CLINICAL HISTORY: Mental status change, unknown cause. FINDINGS: BRAIN AND VENTRICLES: There is  no evidence of an acute infarct, intracranial hemorrhage, mass, midline shift, hydrocephalus, or extra-axial fluid collection. There is mild cerebral atrophy. Patchy hypodensities in the cerebral white matter bilaterally are similar to the prior CT and nonspecific but compatible with moderate chronic small vessel ischemic disease. 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 Calcified atherosclerosis at the skull base. ORBITS: Bilateral cataract extraction. SINUSES: No acute abnormality. SOFT TISSUES AND SKULL: No acute soft tissue abnormality. No skull fracture. IMPRESSION: 1. No acute intracranial abnormality. 2. Moderate chronic small vessel ischemic disease. Electronically signed by: Dasie Hamburg MD 12/25/2024 12:28 PM  EST RP Workstation: [id number]   DG Chest Port 1 View Result Date: 12/25/2024 EXAM: 1 VIEW(S) XRAY OF THE CHEST 12/25/2024 09:33:00 AM COMPARISON: Chest x-ray 02/09/2023. CLINICAL HISTORY: Questionable sepsis - evaluate for abnormality. FINDINGS: LUNGS AND PLEURA: Low lung volumes accentuate the pulmonary vasculature. No focal pulmonary opacity. No pleural effusion. No pneumothorax. HEART AND MEDIASTINUM: Low lung volumes accentuate the cardiomediastinal silhouette. No acute abnormality of the cardiac and mediastinal silhouettes. BONES AND SOFT TISSUES: Mid/lower thoracic spinal fusion hardware. IMPRESSION: 1. No acute cardiopulmonary findings. Electronically signed by: Morgane Naveau MD 12/25/2024 11:44 AM EST RP Workstation: [id number]   DG Thoracic Spine 2 View Result Date: 12/13/2024 EXAM: 3 INTRAOPERATIVE FLUOROSCOPIC VIEWS XRAY OF THE THORACIC SPINE 12/13/2024 04:15:37 PM COMPARISON: None available. CLINICAL HISTORY: Elective surgery [id number]. FINDINGS: BONES: Vertebral body heights are maintained. Alignment is normal. Bilateral pedicle screws are seen at 4 levels. Please see operative report for further description. DISCS  AND DEGENERATIVE CHANGES: No severe degenerative changes. Ligament is grossly anatomic. SOFT TISSUES: The visualized lungs are clear. IMPRESSION: 1. Bilateral pedicle screws at 4 thoracic levels, as above. 2. Thoracic spinal alignment is grossly anatomic. Electronically signed by: Greig Pique MD 12/13/2024 08:03 PM EST RP Workstation: [id number]   DG C-Arm 1-60 Min-No Report Result Date: 12/13/2024 Fluoroscopy was utilized by the requesting physician.  No radiographic interpretation.   DG C-Arm 1-60 Min-No Report Result Date: 12/13/2024 Fluoroscopy was utilized by the requesting physician.  No radiographic interpretation.   DG C-Arm 1-60 Min-No Report Result Date: 12/13/2024 Fluoroscopy was utilized by the requesting physician.  No radiographic interpretation.   MR THORACIC SPINE WO CONTRAST Addendum Date: 12/11/2024 ADDENDUM: Findings discussed with Luke Pean, NP at 5:03PM on 12/11/24. Electronically signed by: Donnice Mania MD 12/11/2024 05:20 PM EST RP Workstation: [id number]   Result Date: 12/11/2024  ORIGINAL REPORT EXAM: MRI THORACIC SPINE WITHOUT INTRAVENOUS CONTRAST 12/11/2024 02:46:58 PM TECHNIQUE: Multiplanar multisequence MRI of the thoracic spine was performed without the administration of intravenous contrast. COMPARISON: CT thoracic spine 12/09/2024. CLINICAL HISTORY: Spine fracture, thoracic, traumatic. FINDINGS: BONES AND ALIGNMENT: Redemonstrated transversely oriented fracture through the T9 vertebral body traversing the anterior and posterior cortices and extending into the bilateral pedicles with evidence of hyperextension at this level corresponding to findings on the CT. There is associated bone marrow edema along the fracture. Additional nondisplaced fracture at the T8 vertebral body with a transverse component involving the anterior and posterior cortices with additional oblique component extending into the inferior endplate. No definite pedicle involvement. There is  additional mild bone marrow edema along the T7 inferior endplate concerning for additional nondisplaced fracture. There is vertebral body height loss noted anteriorly at T9. Otherwise, vertebral body heights are maintained. No significant retropulsion. 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 Possible edema along the posterior longitudinal ligament at T7. Mild focal edema in the ligamentum flavum at T7-T8. Bone marrow signal is otherwise unremarkable. No abnormal enhancement. SPINAL CORD: There is a central disc protrusion at T7-T8 which indents the ventral thoracic cord. There is associated cord compression and cord signal abnormality concerning for edema. There is susceptibility along the left ventral aspect of the thoracic cord at the T7 level seen on series 11 image 32 and series 12 images 3 and 4 concerning for focal traumatic cord hemorrhage. The conus medullaris extends to the L1 level. Prominent dorsal epidural fat throughout the thoracic spine particularly from T3 through T10. There is associated long segment narrowing of the thecal sac and associated CSF flow  artifact which slightly limits the T2 images. SOFT TISSUES: Associated prevertebral edema extending from T6 to T11. There is edema throughout the paraspinal musculature in the mid and lower thoracic spine. Bilateral pleural effusions and associated atelectasis. DEGENERATIVE CHANGES: Facet arthrosis at multiple levels in the thoracic spine. No high grade foraminal stenosis. No significant disc herniation other than the T7-T8 central disc protrusion described above. No spinal canal stenosis other than the long segment narrowing of the thecal sac described above. IMPRESSION: 1. Central disc protrusion at T7-T8 with cord compression and cord signal abnormality concerning for edema. Susceptibility at the left ventral cord at T7 concerning for focal traumatic cord hemorrhage. 2. Transversely oriented T9 vertebral body fracture with  associated bone marrow edema compatible with hyperextension injury. Additional nondisplaced T8 vertebral body fracture and probable additional nondisplaced fracture at the T7 inferior endplate. 3. No significant retropulsion. 4. 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 Possible posterior longitudinal ligament edema at T7 and mild focal edema in the ligamentum flavum at T7-T8. 5. Prominent dorsal epidural fat from T3 to T10 with long-segment thecal sac narrowing which slightly limits evaluation. Electronically signed by: Donnice Mania MD 12/11/2024 04:31 PM EST RP Workstation: [id number]    Microbiology: Results for orders placed or performed during the hospital encounter of 12/25/24  Blood Culture (routine x 2)     Status: None   Collection Time: 12/25/24  9:22 AM   Specimen: BLOOD  Result Value Ref Range Status   Specimen Description BLOOD LEFT ARM  Final   Special Requests   Final    BOTTLES DRAWN AEROBIC AND ANAEROBIC Blood Culture adequate volume   Culture   Final    NO GROWTH 5 DAYS Performed at [facility], [address] [place], [place] [address]    Report Status 12/30/2024 FINAL  Final  Blood Culture (routine x 2)     Status: None   Collection Time: 12/25/24 10:08 AM   Specimen: BLOOD  Result Value Ref Range Status   Specimen Description BLOOD BLOOD RIGHT ARM  Final   Special Requests   Final    BOTTLES DRAWN AEROBIC AND ANAEROBIC Blood Culture adequate volume   Culture   Final    NO GROWTH 5 DAYS Performed at [facility], [address] [place], [place] [address]    Report Status 12/30/2024 FINAL  Final  Urine Culture     Status: Abnormal   Collection Time: 12/25/24  3:27 PM   Specimen: Urine, Catheterized  Result Value Ref Range Status   Specimen Description   Final    URINE, CATHETERIZED Performed at [facility], [address], [place], [place] [address]    Special Requests   Final     NONE Performed at [facility], [address], [place], [place] [address]    Culture (A)  Final    60,000 COLONIES/mL ESCHERICHIA COLI >=100,000 COLONIES/mL ENTEROCOCCUS FAECALIS    Report Status 12/27/2024 FINAL  Final   Organism ID, Bacteria ESCHERICHIA COLI (A)  Final   Organism ID, Bacteria ENTEROCOCCUS FAECALIS (A)  Final      Susceptibility   Escherichia coli - MIC*    AMPICILLIN  8 SENSITIVE Sensitive     CEFAZOLIN  (URINE) Value in next row Sensitive      2 SENSITIVEThis is a modified FDA-approved test that has been validated and its performance characteristics determined by the reporting laboratory.  This laboratory is certified under the Clinical Laboratory Improvement  Amendments CLIA as qualified to perform high complexity clinical laboratory testing.    CEFEPIME  Value in next row Sensitive      2 SENSITIVEThis is a modified FDA-approved test that has been validated and its performance characteristics determined by the reporting laboratory.  This laboratory is certified under the Clinical Laboratory Improvement Amendments CLIA as qualified to perform high complexity clinical laboratory testing.    ERTAPENEM Value in next row Sensitive      2 SENSITIVEThis is a modified FDA-approved test that has been validated and its performance characteristics determined by the reporting laboratory.  This laboratory is certified under the Clinical Laboratory Improvement Amendments CLIA as qualified to perform high complexity clinical laboratory testing.    CEFTRIAXONE  Value in next row Sensitive      2 SENSITIVEThis is a modified FDA-approved test that has been validated and its performance characteristics determined by the reporting laboratory.  This laboratory is certified under the Clinical Laboratory Improvement Amendments CLIA as qualified to perform high complexity clinical laboratory testing.    CIPROFLOXACIN  Value in next row Sensitive      2 SENSITIVEThis is a modified FDA-approved test that  has been validated and its performance characteristics determined by the reporting laboratory.  This laboratory is certified under the Clinical Laboratory Improvement Amendments CLIA as qualified to perform high complexity clinical laboratory testing.    GENTAMICIN Value in next row Sensitive      2 SENSITIVEThis is a modified FDA-approved test that has been validated and its performance characteristics determined by the reporting laboratory.  This laboratory is certified under the Clinical Laboratory Improvement Amendments CLIA as qualified to perform high complexity clinical laboratory testing.    NITROFURANTOIN Value in next row Sensitive      2 SENSITIVEThis is a modified FDA-approved test that has been validated and its performance characteristics determined by the reporting laboratory.  This laboratory is certified under the Clinical Laboratory Improvement Amendments CLIA as qualified to perform high complexity clinical laboratory testing.    TRIMETH/SULFA Value in next row Sensitive      2 SENSITIVEThis is a modified FDA-approved test that has been validated and its performance characteristics determined by the reporting laboratory.  This laboratory is certified under the Clinical Laboratory Improvement Amendments CLIA as qualified to perform high complexity clinical laboratory testing.    AMPICILLIN /SULBACTAM Value in next row Sensitive      2 SENSITIVEThis is a modified FDA-approved test that has been validated and its performance characteristics determined by the reporting laboratory.  This laboratory is certified under the Clinical Laboratory Improvement Amendments CLIA as qualified to perform high complexity clinical laboratory testing.    PIP/TAZO Value in next row Sensitive      <=4 SENSITIVEThis is a modified FDA-approved test that has been validated and its performance characteristics determined by the reporting laboratory.  This laboratory is certified under the Clinical Laboratory  Improvement Amendments CLIA as qualified to perform high complexity clinical laboratory testing.    MEROPENEM Value in next row Sensitive      <=4 SENSITIVEThis is a modified FDA-approved test that has been validated and its performance characteristics determined by the reporting laboratory.  This laboratory is certified under the Clinical Laboratory Improvement Amendments CLIA as qualified to perform high complexity clinical laboratory testing.    * 60,000 COLONIES/mL ESCHERICHIA COLI   Enterococcus faecalis - MIC*    AMPICILLIN  Value in next row Sensitive      <=4 SENSITIVEThis is a modified FDA-approved test that has  been validated and its performance characteristics determined by the reporting laboratory.  This laboratory is certified under the Clinical Laboratory Improvement Amendments CLIA as qualified to perform high complexity clinical laboratory testing.    NITROFURANTOIN Value in next row Sensitive      <=4 SENSITIVEThis is a modified FDA-approved test that has been validated and its performance characteristics determined by the reporting laboratory.  This laboratory is certified under the Clinical Laboratory Improvement Amendments CLIA as qualified to perform high complexity clinical laboratory testing.    VANCOMYCIN  Value in next row Sensitive      <=4 SENSITIVEThis is a modified FDA-approved test that has been validated and its performance characteristics determined by the reporting laboratory.  This laboratory is certified under the Clinical Laboratory Improvement Amendments CLIA as qualified to perform high complexity clinical laboratory testing.    * >=100,000 COLONIES/mL ENTEROCOCCUS FAECALIS  Urine Culture     Status: Abnormal   Collection Time: 12/25/24  9:01 PM   Specimen: Kidney, Right; Urine  Result Value Ref Range Status   Specimen Description URINE, RANDOM  Final   Special Requests RIGHT RENAL PELVIS SPEC A  Final   Culture (A)  Final    >=100,000 COLONIES/mL KLEBSIELLA  PNEUMONIAE 30,000 COLONIES/mL ENTEROCOCCUS RAFFINOSUS Standardized susceptibility testing for this organism is not available. Performed at [facility], [address] [place], [place] [address]    Report Status 12/29/2024 FINAL  Final   Organism ID, Bacteria KLEBSIELLA PNEUMONIAE (A)  Final      Susceptibility   Klebsiella pneumoniae - MIC*    AMPICILLIN  RESISTANT Resistant     CEFAZOLIN  (URINE) Value in next row Sensitive      2 SENSITIVEThis is a modified FDA-approved test that has been validated and its performance characteristics determined by the reporting laboratory.  This laboratory is certified under the Clinical Laboratory Improvement Amendments CLIA as qualified to perform high complexity clinical laboratory testing.    CEFEPIME  Value in next row Sensitive      2 SENSITIVEThis is a modified FDA-approved test that has been validated and its performance characteristics determined by the reporting laboratory.  This laboratory is certified under the Clinical Laboratory Improvement Amendments CLIA as qualified to perform high complexity clinical laboratory testing.    ERTAPENEM Value in next row Sensitive      2 SENSITIVEThis is a modified FDA-approved test that has been validated and its performance characteristics determined by the reporting laboratory.  This laboratory is certified under the Clinical Laboratory Improvement Amendments CLIA as qualified to perform high complexity clinical laboratory testing.    CEFTRIAXONE  Value in next row Sensitive      2 SENSITIVEThis is a modified FDA-approved test that has been validated and its performance characteristics determined by the reporting laboratory.  This laboratory is certified under the Clinical Laboratory Improvement Amendments CLIA as qualified to perform high complexity clinical laboratory testing.    CIPROFLOXACIN  Value in next row Sensitive      2 SENSITIVEThis is a modified FDA-approved test that has been validated and its  performance characteristics determined by the reporting laboratory.  This laboratory is certified under the Clinical Laboratory Improvement Amendments CLIA as qualified to perform high complexity clinical laboratory testing.    GENTAMICIN Value in next row Sensitive      2 SENSITIVEThis is a modified FDA-approved test that has been validated and its performance characteristics determined by the reporting laboratory.  This laboratory is certified under the Clinical Laboratory Improvement Amendments CLIA as  qualified to perform high complexity clinical laboratory testing.    NITROFURANTOIN Value in next row Intermediate      2 SENSITIVEThis is a modified FDA-approved test that has been validated and its performance characteristics determined by the reporting laboratory.  This laboratory is certified under the Clinical Laboratory Improvement Amendments CLIA as qualified to perform high complexity clinical laboratory testing.    TRIMETH/SULFA Value in next row Sensitive      2 SENSITIVEThis is a modified FDA-approved test that has been validated and its performance characteristics determined by the reporting laboratory.  This laboratory is certified under the Clinical Laboratory Improvement Amendments CLIA as qualified to perform high complexity clinical laboratory testing.    AMPICILLIN /SULBACTAM Value in next row Sensitive      2 SENSITIVEThis is a modified FDA-approved test that has been validated and its performance characteristics determined by the reporting laboratory.  This laboratory is certified under the Clinical Laboratory Improvement Amendments CLIA as qualified to perform high complexity clinical laboratory testing.    PIP/TAZO Value in next row Sensitive      <=4 SENSITIVEThis is a modified FDA-approved test that has been validated and its performance characteristics determined by the reporting laboratory.  This laboratory is certified under the Clinical Laboratory Improvement Amendments CLIA as  qualified to perform high complexity clinical laboratory testing.    MEROPENEM Value in next row Sensitive      <=4 SENSITIVEThis is a modified FDA-approved test that has been validated and its performance characteristics determined by the reporting laboratory.  This laboratory is certified under the Clinical Laboratory Improvement Amendments CLIA as qualified to perform high complexity clinical laboratory testing.    * >=100,000 COLONIES/mL KLEBSIELLA PNEUMONIAE  Culture, blood (Routine X 2) w Reflex to ID Panel     Status: None   Collection Time: 01/01/25  8:05 AM   Specimen: BLOOD RIGHT HAND  Result Value Ref Range Status   Specimen Description BLOOD RIGHT HAND  Final   Special Requests   Final    BOTTLES DRAWN AEROBIC AND ANAEROBIC Blood Culture adequate volume   Culture   Final    NO GROWTH 5 DAYS Performed at [facility], [address] [place], [place] [address]    Report Status 01/06/2025 FINAL  Final  Culture, blood (Routine X 2) w Reflex to ID Panel     Status: None   Collection Time: 01/01/25  8:12 AM   Specimen: BLOOD LEFT HAND  Result Value Ref Range Status   Specimen Description BLOOD LEFT HAND  Final   Special Requests   Final    BOTTLES DRAWN AEROBIC AND ANAEROBIC Blood Culture adequate volume   Culture   Final    NO GROWTH 5 DAYS Performed at [facility], [address] [place], [place] [address]    Report Status 01/06/2025 FINAL  Final    Labs: CBC: Recent Labs  Lab 01/04/25 0206 01/05/25 0234 01/06/25 0248 01/08/25 0953 01/10/25 0336  WBC 15.2* 15.6* 13.2* 12.4* 9.0  HGB 10.4* 10.8* 10.5* 10.7* 10.0*  HCT 33.5* 33.8* 33.1* 34.2* 31.5*  MCV 91.3 91.4 90.2 91.4 90.0  PLT 380 381 412* 293 262   Basic Metabolic Panel: Recent Labs  Lab 01/04/25 0206 01/05/25 0839 01/06/25 0248 01/08/25 0953 01/10/25 0336  NA 138 141 139 140 141  K 4.4 4.5 4.5 4.2 4.6  CL 106 109 107 108 109  CO2 22 22 22 22 22   GLUCOSE 100* 93 99 128* 95  BUN 38*  31* 31* 32* 32*  CREATININE 2.53* 2.25* 2.43* 2.65* 2.63*  CALCIUM 8.5* 8.8* 8.8* 8.6* 8.9   Liver Function Tests: Recent Labs  Lab 01/06/25 0248  AST 24  ALT 27  ALKPHOS 125  BILITOT 0.2  PROT 6.3*  ALBUMIN  2.8*   CBG: Recent Labs  Lab 01/06/25 2112 01/07/25 0104 01/07/25 0736 01/07/25 1159 01/07/25 1702  GLUCAP 127* 95 85 105* 101*    Discharge time spent: greater than 30 minutes.  Signed: Nydia Distance, MD [facility] 01/10/2025 "

## 2025-01-10 NOTE — TOC Progression Note (Addendum)
 Transition of Care [facility]) - Progression Note    Patient Details  Name: Zachary [name] MRN: [id number] Date of Birth: 09/29/1946  [facility]) CM/SW Contact  Lendia Dais, [place] Phone Number: 01/10/2025, 10:20 AM  Clinical Narrative: Authorization has been approved for SNF at [facility]. Reference number [id number] shara expires in 5 days.  CSW informed MD and [facility] via secure chat. CSW left a HIPAA approved VM for pt's spouse.  11:45 - CSW spoke to pt's spouse and alerted her of authorization being approved for [facility]. CSW informed spouse of possible discharge today and will call to confirm. Per MD pt will discharge and [facility] stated there is a bed available today for this pt. CSW will call spouse upon time of transportation.  CSW will continue follow.    Expected Discharge Plan: Skilled Nursing Facility Barriers to Discharge: Continued Medical Work up, English As A Second Language Teacher               Expected Discharge Plan and Services In-house Referral: Clinical Social Work     Living arrangements for the past 2 months: Single Family Home, Skilled Nursing Facility                                       Social Drivers of Health (SDOH) Interventions SDOH Screenings   Food Insecurity: No Food Insecurity (12/29/2024)  Housing: Low Risk (12/29/2024)  Transportation Needs: No Transportation Needs (12/29/2024)  Utilities: Not At Risk (12/29/2024)  Depression (PHQ2-9): High Risk (07/26/2023)  Social Connections: Moderately Isolated (12/29/2024)  Tobacco Use: Medium Risk (12/25/2024)    Readmission Risk Interventions    12/10/2024    9:44 AM 02/11/2023   12:02 PM  Readmission Risk Prevention Plan  Medication Screening Complete   Transportation Screening Complete Complete  PCP or Specialist Appt within 5-7 Days  Complete  Home Care Screening  Complete  Medication Review (RN CM)  Complete

## 2025-01-10 NOTE — TOC Transition Note (Addendum)
 Transition of Care [facility]) - Discharge Note   Patient Details  Name: Zachary [name] MRN: [id number] Date of Birth: 01-31-1946  Transition [facility]) CM/SW Contact:  Lendia Dais, LCSWA Phone Number: 01/10/2025, 12:17 PM   Clinical Narrative:  Pt is discharging to [facility]. RN report to [phone]. PTAR called at 1310 and on the way. RN notified.  CSW informed pt's spouse via phone of discharge and transport.  No further TOC needs.      Final next level of care: Skilled Nursing Facility Barriers to Discharge: Barriers Resolved   Patient Goals and CMS Choice            Discharge Placement              Patient chooses bed at: [facility] Patient to be transferred to facility by: [facility] Name of family member notified: Marchetta,Patricia H  Spouse, Emergency Contact  [phone] Patient and family notified of of transfer: 01/10/25  Discharge Plan and Services Additional resources added to the After Visit Summary for   In-house Referral: Clinical Social Work                                   Social Drivers of Health (SDOH) Interventions SDOH Screenings   Food Insecurity: No Food Insecurity (12/29/2024)  Housing: Low Risk (12/29/2024)  Transportation Needs: No Transportation Needs (12/29/2024)  Utilities: Not At Risk (12/29/2024)  Depression (PHQ2-9): High Risk (07/26/2023)  Social Connections: Moderately Isolated (12/29/2024)  Tobacco Use: Medium Risk (12/25/2024)     Readmission Risk Interventions    12/10/2024    9:44 AM 02/11/2023   12:02 PM  Readmission Risk Prevention Plan  Medication Screening Complete   Transportation Screening Complete Complete  PCP or Specialist Appt within 5-7 Days  Complete  Home Care Screening  Complete  Medication Review (RN CM)  Complete

## 2025-01-11 ENCOUNTER — Non-Acute Institutional Stay (SKILLED_NURSING_FACILITY): Payer: Self-pay | Admitting: Adult Health

## 2025-01-11 ENCOUNTER — Encounter: Payer: Self-pay | Admitting: Adult Health

## 2025-01-11 ENCOUNTER — Other Ambulatory Visit: Payer: Self-pay | Admitting: Adult Health

## 2025-01-11 DIAGNOSIS — E43 Unspecified severe protein-calorie malnutrition: Secondary | ICD-10-CM

## 2025-01-11 DIAGNOSIS — F5101 Primary insomnia: Secondary | ICD-10-CM | POA: Diagnosis not present

## 2025-01-11 DIAGNOSIS — H40053 Ocular hypertension, bilateral: Secondary | ICD-10-CM

## 2025-01-11 DIAGNOSIS — G4733 Obstructive sleep apnea (adult) (pediatric): Secondary | ICD-10-CM | POA: Diagnosis not present

## 2025-01-11 DIAGNOSIS — I10 Essential (primary) hypertension: Secondary | ICD-10-CM

## 2025-01-11 DIAGNOSIS — E782 Mixed hyperlipidemia: Secondary | ICD-10-CM

## 2025-01-11 DIAGNOSIS — R29818 Other symptoms and signs involving the nervous system: Secondary | ICD-10-CM

## 2025-01-11 DIAGNOSIS — S22078D Other fracture of T9-T10 vertebra, subsequent encounter for fracture with routine healing: Secondary | ICD-10-CM | POA: Diagnosis not present

## 2025-01-11 DIAGNOSIS — N136 Pyonephrosis: Secondary | ICD-10-CM

## 2025-01-11 DIAGNOSIS — A419 Sepsis, unspecified organism: Secondary | ICD-10-CM

## 2025-01-11 DIAGNOSIS — N1831 Chronic kidney disease, stage 3a: Secondary | ICD-10-CM

## 2025-01-11 DIAGNOSIS — F4323 Adjustment disorder with mixed anxiety and depressed mood: Secondary | ICD-10-CM

## 2025-01-11 MED ORDER — LORAZEPAM 0.5 MG PO TABS: 0.5000 mg | ORAL_TABLET | Freq: Every day | ORAL | 0 refills | Status: AC | PRN

## 2025-01-11 MED ORDER — OXYCODONE HCL 5 MG PO TABS: 5.0000 mg | ORAL_TABLET | Freq: Four times a day (QID) | ORAL | 0 refills | Status: AC | PRN

## 2025-01-11 NOTE — Progress Notes (Signed)
 " Location:  [facility] Room Number: 130 Place of Service:  SNF (31)   CODE STATUS: dnr   Allergies[1]  Chief Complaint  Patient presents with   Hospitalization Follow-up    HPI:  He is a 79 year old man who has been hospitalized from 12-25-24 through 01-10-25. His pas medical history includes: urine retention with chronic foley; history of bladder cancer; CKD stage 3b; hypertension; hyperlipidemia. He had been recently hospitalized after suffering a fall while walking up to his residence where he fell on his back and hit his head. He was found to an acute T9 fracture with hyperextension; severe spinal stenosis at L2-3. He was seen by neurosurgery and underwent a posterior decompression and fusion from T7-T11 on 12-13-24.  He returned to the hospital for altered mental status, hypotension, tachycardia and hypoxia. He was initially admitted to the ICU for septic shock secondary to UTI. He underwent a cystoscopy on 12-25-24 with a right sided ureteral stent placement. He was initially treated with IV vancomycin  and rocephin  then switched to po augmentin . His urine culture was positive for 3 different bacteria: e-coli, klebsiella and enterococcus faecalis. After his abt was changes to augmentin  he had worsening leukocytosis the abt was changed to linezolid  and zosyn . He completed a 15 day coarse of therapy. His ct of abdomen and his right upper quad ultrasound demonstrates a distended gall bladder, gallstones and sludge without evidence of acute cholecystitis.  His fracture remains stable will continue to need medications for pain management.  He did have acute metabolic encephalopathy and delirium due to his underlying infection; his ct demonstrated moderate chronic ischemic microvascular disease; remote lacunar infarcts in the left caudate and right thalamus.  With his urinary retention he was seen by urology service and is planned for a ureteroscopy in the next 2-3 weeks. He  had acute on chronic renal failure. His creat peaked at 3.9 with a base creat of 2.0.  He is here for short term rehab with his goal to return back home. He will continue to be followed for his chronic illnesses including:  Chronic kidney disease stage 3b: ABLA (acute blood loss anemia)  Mixed hyperlipidemia: Essential hypertension    Past Medical History:  Diagnosis Date   Anxiety    takes Valium  as needed   Arthritis    Bladder cancer (HCC)    takes Rapaflo daily   Blood dyscrasia    07/08/16: pt told he was a free bleeder after bleeding a lot when derm cut off mole on forehead. No excessive bleeding with minor wounds at home, no bleeding problems perioperatively with prior surgeries.   Chronic back pain    spondylolisthesis   Cluster headaches    Depression    takes Lexapro  daily   Essential hypertension, benign    takes Diovan -HCT daily   Hearing loss    hearing aids   History of kidney stones    Hx of complications due to general anesthesia    Aborted surgery 07/15/2016 due to hypotension per pt   Intertrochanteric fracture of left femur (HCC) 02/09/2023   Joint pain    Kidney stone 03/2019   passed stone   Obstructive sleep apnea on CPAP    uses CPAP @ night   Parkinsonism (HCC)    Pneumonia    several times--last time about 3-4 yrs ago   Pre-diabetes    Prediabetes    Syncopal episodes    Thyroid  condition     Past Surgical  History:  Procedure Laterality Date   ANTERIOR CERVICAL DECOMP/DISCECTOMY FUSION N/A 06/05/2013   Procedure:  C3-4 Anterior Cervical Discectomy and Fusion, Allograft, Plate;  Surgeon: Oneil JAYSON Herald, MD;  Location: [facility] OR;  Service: Orthopedics;  Laterality: N/A;  C3-4 Anterior Cervical Discectomy and Fusion, Allograft, Plate   Arthroscopic knee surgery     BACK SURGERY      x 2   BLADDER SURGERY     x 5 to remove tumor   CARPAL TUNNEL RELEASE     CATARACT EXTRACTION W/PHACO  12/19/2012   Procedure: CATARACT EXTRACTION PHACO AND INTRAOCULAR  LENS PLACEMENT (IOC);  Surgeon: Cherene Mania, MD;  Location: [facility] ORS;  Service: Ophthalmology;  Laterality: Left;  CDE: 26.80   CATARACT EXTRACTION W/PHACO  01/09/2013   Procedure: CATARACT EXTRACTION PHACO AND INTRAOCULAR LENS PLACEMENT (IOC);  Surgeon: Cherene Mania, MD;  Location: [facility] ORS;  Service: Ophthalmology;  Laterality: Right;  CDE:22.83   CERVICAL FUSION  06/05/2013   C 3  C4   CYSTOSCOPY     CYSTOSCOPY W/ URETERAL STENT PLACEMENT Bilateral 02/12/2020   Procedure: CYSTOSCOPY WITH RETROGRADE PYELOGRAM/URETERAL STENT PLACEMENT;  Surgeon: Watt Rush, MD;  Location: [facility] ORS;  Service: Urology;  Laterality: Bilateral;   CYSTOSCOPY W/ URETERAL STENT PLACEMENT Right 12/25/2024   Procedure: CYSTOSCOPY, WITH RETROGRADE PYELOGRAM AND URETERAL STENT INSERTION;  Surgeon: Watt Rush, MD;  Location: [facility] OR;  Service: Urology;  Laterality: Right;   INTRAMEDULLARY (IM) NAIL INTERTROCHANTERIC Left 02/10/2023   Procedure: LEFT INTRAMEDULLARY (IM) NAILING OF LEFT FEMUR;  Surgeon: Kendal Franky SQUIBB, MD;  Location: [facility] OR;  Service: Orthopedics;  Laterality: Left;   LAMINECTOMY WITH POSTERIOR LATERAL ARTHRODESIS LEVEL 4 N/A 12/13/2024   Procedure: Thoracic Seven-Thoracic Eight - Thoracic Eight-Thoracic Nine - Thoracic Nine-Thoracic Ten, Thoracic Ten-Thoracic Eleven pedicle screw fixation and posterolateral arthrodesis decompression of Thoracic Eight;  Surgeon: Onetha Kuba, MD;  Location: [facility] OR;  Service: Neurosurgery;  Laterality: N/A;  T7-T8 - T8-T9 - T9-T10, T10-11 pedicle screw fixation and posterolateral arthrodesis  decompress   LITHOTRIPSY  02/2020   REFRACTIVE SURGERY     Hx: of   RETINAL DETACHMENT SURGERY     TOTAL KNEE ARTHROPLASTY Left 02/01/2018   Procedure: TOTAL KNEE ARTHROPLASTY;  Surgeon: Margrette Taft BRAVO, MD;  Location: [facility];  Service: Orthopedics;  Laterality: Left;   TRANSURETHRAL RESECTION OF BLADDER TUMOR Right 07/27/2015   Procedure: TRANSURETHRAL RESECTION OF BLADDER TUMOR (TURBT);  Surgeon:  Arlena Gal, MD;  Location: [facility];  Service: Urology;  Laterality: Right;   TRANSURETHRAL RESECTION OF BLADDER TUMOR WITH MITOMYCIN -C N/A 01/25/2023   Procedure: TRANSURETHRAL RESECTION OF BLADDER TUMOR WITH GEMCITABINE ;  Surgeon: Carolee Sherwood JONETTA DOUGLAS, MD;  Location: [facility] ORS;  Service: Urology;  Laterality: N/A;   wisdom tooth extraction      Social History   Socioeconomic History   Marital status: Married    Spouse name: Pat    Number of children: 0   Years of education: 13   Highest education level: Not on file  Occupational History   Occupation: Retired    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[employer]    Employer: UNEMPLOYED  Tobacco Use   Smoking status: Former    Current packs/day: 0.00    Average packs/day: 0.3 packs/day for 1 year (0.3 ttl pk-yrs)    Types: Cigarettes, Cigars    Start date: 12/31/1975    Quit date: 12/30/1976    Years since quitting: 48.0    Passive exposure: Past   Smokeless  tobacco: Never   Tobacco comments:    1985  Vaping Use   Vaping status: Never Used  Substance and Sexual Activity   Alcohol  use: No    Comment: quit 1990   Drug use: No   Sexual activity: Yes  Other Topics Concern   Not on file  Social History Narrative   Lives with wife    caffeine- sodas   Right handed   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    Social Drivers of Health   Tobacco Use: Medium Risk (01/11/2025)   Patient History    Smoking Tobacco Use: Former    Smokeless Tobacco Use: Never    Passive Exposure: Past  Physicist, Medical Strain: Not on file  Food Insecurity: No Food Insecurity (12/29/2024)   Epic    Worried About Programme Researcher, Broadcasting/film/video in the Last Year: Never true    Ran Out of Food in the Last Year: Never true  Transportation Needs: No Transportation Needs (12/29/2024)   Epic    Lack of Transportation (Medical): No    Lack of Transportation (Non-Medical): No  Physical Activity: Not on file  Stress: Not on file  Social Connections: Moderately Isolated (12/29/2024)    Social Connection and Isolation Panel    Frequency of Communication with Friends and Family: More than three times a week    Frequency of Social Gatherings with Friends and Family: Never    Attends Religious Services: Never    Database Administrator or Organizations: No    Attends Banker Meetings: Never    Marital Status: Married  Catering Manager Violence: Not At Risk (12/29/2024)   Epic    Fear of Current or Ex-Partner: No    Emotionally Abused: No    Physically Abused: No    Sexually Abused: No  Depression (PHQ2-9): High Risk (07/26/2023)   Depression (PHQ2-9)    PHQ-2 Score: 18  Alcohol  Screen: Not on file  Housing: Low Risk (12/29/2024)   Epic    Unable to Pay for Housing in the Last Year: No    Number of Times Moved in the Last Year: 0    Homeless in the Last Year: No  Utilities: Not At Risk (12/29/2024)   Epic    Threatened with loss of utilities: No  Health Literacy: Not on file   Family History  Problem Relation Age of Onset   Hypertension Father    Cancer Father        Prostate   Depression Mother    Hypertension Mother    Alcohol  abuse Mother    COPD Mother        passed 07-08-17   Hypertension Sister    Anxiety disorder Sister    Alcohol  abuse Maternal Grandfather    ADD / ADHD Neg Hx    Bipolar disorder Neg Hx    Dementia Neg Hx    Drug abuse Neg Hx    OCD Neg Hx    Paranoid behavior Neg Hx    Schizophrenia Neg Hx    Seizures Neg Hx    Sexual abuse Neg Hx    Physical abuse Neg Hx       VITAL SIGNS BP 132/72   Pulse 85   Temp (!) 97.1 F (36.2 C)   Resp 18   Ht 6' (1.829 m)   Wt 179 lb 9.6 oz (81.5 kg)   SpO2 99%   BMI 24.36 kg/m   Outpatient Encounter Medications as of 01/11/2025  Medication  Sig   acetaminophen  (TYLENOL ) 500 MG tablet Take 500 mg by mouth every 8 (eight) hours as needed (pain level 1-3/ headache/fever).   carvedilol  (COREG ) 12.5 MG tablet Take 1 tablet (12.5 mg total) by mouth 2 (two) times daily.    dorzolamide -timolol  (COSOPT ) 2-0.5 % ophthalmic solution Place 1 drop into both eyes 2 (two) times daily.   DULoxetine  (CYMBALTA ) 60 MG capsule Take 1 capsule (60 mg total) by mouth 2 (two) times daily.   latanoprost  (XALATAN ) 0.005 % ophthalmic solution Place 1 drop into both eyes at bedtime.   lidocaine  (LIDODERM ) 5 % Place 1 patch onto the skin daily. Apply to back. Apply to intact skin to cover the most painful area.   LORazepam  (ATIVAN ) 0.5 MG tablet Take 1 tablet (0.5 mg total) by mouth daily as needed for anxiety.   magic mouthwash w/lidocaine  SOLN Take 10 mLs by mouth 3 (three) times daily as needed for mouth pain. Suspension contains equal amounts of Maalox Extra Strength, nystatin , diphenhydramine  and lidocaine .   melatonin 3 MG TABS tablet Take 1 tablet (3 mg total) by mouth at bedtime.   methocarbamol  (ROBAXIN ) 500 MG tablet Take 1 tablet (500 mg total) by mouth every 8 (eight) hours as needed for muscle spasms.   mirtazapine  (REMERON ) 7.5 MG tablet Take 1 tablet (7.5 mg total) by mouth at bedtime.   Multiple Vitamin (MULTIVITAMIN) tablet Take 1 tablet by mouth daily.   Multiple Vitamins-Minerals (PRESERVISION AREDS PO) Take 1 capsule by mouth in the morning and at bedtime.   Omega-3 Fatty Acids (FISH OIL) 1000 MG CAPS Take 1 capsule by mouth daily.   oxyCODONE  (OXY IR/ROXICODONE ) 5 MG immediate release tablet Take 1 tablet (5 mg total) by mouth every 6 (six) hours as needed for severe pain (pain score 7-10).   polyethylene glycol (MIRALAX  / GLYCOLAX ) 17 g packet Take 17 g by mouth daily.   pravastatin  (PRAVACHOL ) 10 MG tablet Take 10 mg by mouth at bedtime.   Protein (PROSOURCE PO) Take 30 mLs by mouth 3 (three) times daily.   risperiDONE  (RISPERDAL ) 0.5 MG tablet Take 1 tablet (0.5 mg total) by mouth at bedtime.   No facility-administered encounter medications on file as of 01/11/2025.     SIGNIFICANT DIAGNOSTIC EXAMS   LABS  12-09-24: wbc 12.4; hgb 13.5; hct 41.5; mcv 91.8  plt 253; glucose 122; bun 28; creat 2.04; k+ 3.6; na++ 412; ca 9.1; gfr 33; protein 7.0 albumin  4.0 12-13-24: wbc 10.4; hgb 11.2; hct 34.3; mcv 91.0 plt 214; glucose 103; bun 24; creat 1.75; k+ 3.8; an++ 139; ca 8.5 gfr 39; protein 6.5 albumin  2.8 12-19-24: wbc 9.1; hgb 11.0; hct 33.6; mcv 89.4 plt 298; glucose 97; bun 27; creat 1.59; k+ 3.7; na++ 137; ca 8.4 gfr 44;  12-25-24: wbc 12.6; hgb 12.8; hct 39.7; mcv 88.4 plt 392; glucose 82; bun 89; creat 3.90 ;k+ 4.2; na++ 138; ca 8.3 gfr 15; alk phos 257; protein 6.4 albumin  2.8: urine culture: e-coli; enterococcus; klebsiella pneumoniae 12-30-24: wbc 26.2; hgb 10.7; hct 33.8; mcv 90.1 plt 264; glucose 95; bun 55; creat 2.21; k+ 4.9; na++ 145; ca 8.4 gfr 30; alk phos 147; protein 5.9 albumin  2.6; vitamin B12: 1023; folate 8.9; tsh 2.820 01-10-25: wbc 9.0; hgb 10.0; hct 31.5; mcv 90.0 plt 262; glucose 95; bun 32; creat 2.63; k+ 4.6; na++ 141; ca 8.9; gfr 24   Review of Systems  Constitutional:  Positive for malaise/fatigue.  Respiratory:  Negative for cough and shortness of breath.  Cardiovascular:  Negative for chest pain, palpitations and leg swelling.  Gastrointestinal:  Negative for abdominal pain, constipation and heartburn.  Musculoskeletal:  Positive for back pain. Negative for joint pain and myalgias.  Skin: Negative.   Neurological:  Negative for dizziness.  Psychiatric/Behavioral:  The patient is not nervous/anxious.     Physical Exam Constitutional:      General: He is not in acute distress.    Appearance: He is well-developed. He is not diaphoretic.  Neck:     Thyroid : No thyromegaly.  Cardiovascular:     Rate and Rhythm: Normal rate and regular rhythm.     Heart sounds: Normal heart sounds.  Pulmonary:     Effort: Pulmonary effort is normal. No respiratory distress.     Breath sounds: Normal breath sounds.  Abdominal:     General: Bowel sounds are normal. There is no distension.     Palpations: Abdomen is soft.     Tenderness:  There is no abdominal tenderness.  Genitourinary:    Comments: foley Musculoskeletal:        General: Normal range of motion.     Cervical back: Neck supple.     Right lower leg: No edema.     Left lower leg: No edema.     Comments: Is s/p T7-11: posterior decompression and fusion on 12-13-24.    Lymphadenopathy:     Cervical: No cervical adenopathy.  Skin:    General: Skin is warm and dry.     Comments: Left heel: DTI   Neurological:     Mental Status: He is alert. Mental status is at baseline.     Comments: BCAT 31/50  Psychiatric:        Mood and Affect: Mood normal.      ASSESSMENT/ PLAN:  TODAY  Septic shock /acute pyonephrosis: has completed a 15 coarse of antibiotic therapy. His urine culture grew three bacteria  2. Other closed fracture of ninth thoracic vertebra with routine healing subsequent encounter: will continue therapy as directed to improve upon his level of independence with his adl care. Will continue lidocaine  patch to back daily. He continues to have pain management issues. Has oxycodone  5 mg every 6 hours as needed through 06-15-25. Will change his tylenol  to cr 650 mg every 6 hours routinely with robaxin  500 mg every 6 hours  3. Chronic kidney disease stage 3b: bun 32; creat 2.63 gfr 24  4. ABLA (acute blood loss anemia) hgb 10.0  5. Mixed hyperlipidemia: will continue pravachol  10 mg daily   6. Essential hypertension: b/p 132/72: will continue coreg  12.5 mg twice daily; his arb was stopped in the hospital due to renal failure.   7. Obstructive sleep apnea: uses cpap  8. Increased intraocular pressure bilateral: will continue cosopt  to both eyes twice daily and xalatan  to both eyes nightly   9. Adjustment disorder with mixed anxiety and depressed mood: will continue cymbalta  60 mg twice daily and has ativan  0.5 mg daily as needed through 01-23-25.  Will stop risperdal    10. Severe protein calorie malnutrition: albumin  2.6 will continue prosource 30 ml  three times daily is on remeron  7.5 mg nightly to help with appetite.   11. Chronic constipation: will continue miralax  daily   12. Primary insomnia: will continue melatonin 5 mg nightly   13. Neurocognitive deficits:  BCAT 31/50.   Will repeat cbc; bmp; will check iron studies.         Barnie Seip NP [facility]  call  [phone]     [1]  Allergies Allergen Reactions   Poison Oak Extract Hives and Itching   Sulfa Antibiotics Other (See Comments)    Unknown    Sulfonamide Derivatives Itching and Rash   "

## 2025-01-12 ENCOUNTER — Encounter: Payer: Self-pay | Admitting: Internal Medicine

## 2025-01-12 ENCOUNTER — Non-Acute Institutional Stay (SKILLED_NURSING_FACILITY): Payer: Self-pay | Admitting: Internal Medicine

## 2025-01-12 DIAGNOSIS — A419 Sepsis, unspecified organism: Secondary | ICD-10-CM | POA: Diagnosis not present

## 2025-01-12 DIAGNOSIS — L89623 Pressure ulcer of left heel, stage 3: Secondary | ICD-10-CM

## 2025-01-12 DIAGNOSIS — R6521 Severe sepsis with septic shock: Secondary | ICD-10-CM

## 2025-01-12 DIAGNOSIS — E440 Moderate protein-calorie malnutrition: Secondary | ICD-10-CM

## 2025-01-12 DIAGNOSIS — N179 Acute kidney failure, unspecified: Secondary | ICD-10-CM

## 2025-01-12 DIAGNOSIS — R4189 Other symptoms and signs involving cognitive functions and awareness: Secondary | ICD-10-CM | POA: Diagnosis not present

## 2025-01-12 DIAGNOSIS — R29818 Other symptoms and signs involving the nervous system: Secondary | ICD-10-CM

## 2025-01-12 DIAGNOSIS — F4323 Adjustment disorder with mixed anxiety and depressed mood: Secondary | ICD-10-CM

## 2025-01-12 NOTE — Patient Instructions (Signed)
 See assessment and plan under each diagnosis in the problem list and acutely for this visit

## 2025-01-12 NOTE — Progress Notes (Unsigned)
 "   NURSING HOME LOCATION:  [facility] ROOM NUMBER:  [id number]  CODE STATUS:  DNR  PCP: Shona Norleen PEDLAR, MD   This is a nursing facility follow up visit for Nursing Facility readmission within 30 days.  Interim medical record and care since last SNF visit was updated with review of diagnostic studies and change in clinical status since last visit were documented.  HPI: He was rehospitalized 12/25/2024 - 01/10/2025 with sepsis syndrome and AKI superimposed on CKD in the context of positive urine cultures.  He had been sent to the ED for altered mental status,hypotension ,tachycardia, and hypoxia.  White blood count was 33,400.  Lactic acid level peak was 2.7. ProBNP was 2105.   Empiric vancomycin  and Rocephin  were initiated pending C&S return.  He was admitted to the ICU for septic shock; pressors were required.   Cystoscopy was performed on 12/29 with right ureteral stent placement.  He was able to be weaned off the pressors as of 12/30. He was subsequently transitioned to oral Augmentin . He had recently been hospitalized for T9 vertebral fractures sustained in a mechanical fall requiring decompression and fusion of the thoracic spine.  Neurosurgery consulted; hardware appeared to be intact.  MRI revealed chronic changes along the postsurgical changes , but no evidence of infection. Urine culture subsequently revealed greater than 100,000 colonies of Enterococcus Faecalis and 100,000 colonies of Klebsiella.  Enterococcus Raffanosis and E. coli  were present with colony counts less than 100,000. He completed a full 15 days of antibiotics while hospitalized. Initial creatinine was the peak creatinine with a value of 3.90.  GFR was 15 indicating borderline stage IV/V CKD. Final creatinine was 2.63 with a GFR of 24 indicating CKD stage IV.  Admission H/H was 12.8/39.7; final values were 10/31.5.  Final white blood count was 9000.  Total protein was 6.3 and albumin  2.8. Mental status  improved and he was felt to be back to his baseline.  While hospitalized he was seen by Psychiatry for major depressive disorder.  Remeron  7.5 mg nightly, Cymbalta , and Ativan  0.5 mg daily as needed were recommended for acute anxiety.  B12, TSH, ammonia, and folate levels were normal.   PT/OT consulted and recommended SNF placement to resume rehab.  The Foley catheter which was replaced during this admission was to be maintained.  Urology planned ureteroscopy in 2 to 3 weeks prior to discharge.  As Foley is to be chronic; no voiding trial was recommended. Wound care monitor at the SNF placement was to be continued for a deep tissue pressure injury of the left heel.  Review of systems: Neurocognitive deficits suggested by responses.  When I introduced myself , he asked Did you do surgery on me?  When asked what diagnoses had been made, his response was not really in the clear,really didn't tell me anything.  He did validate having had a stent and Foley put in.  He states he is now doing pretty good.  He validated having dysphagia , but his wife denied this.  She states that he has been eating poorly and she actually has to feed him.  He seemed to indicate he had abdominal discomfort if you push around.  His wife states that he has limited use of the left upper extremity due to a chronic left rotator cuff issue.  Constitutional: No fever, significant weight change, fatigue  Eyes: No redness, discharge, pain, vision change ENT/mouth: No nasal congestion,  purulent discharge, earache, change in hearing, sore  throat  Cardiovascular: No chest pain, palpitations, paroxysmal nocturnal dyspnea, edema  Respiratory: No cough, sputum production, hemoptysis, DOE, significant snoring, apnea   Gastrointestinal: No heartburn, nausea /vomiting, rectal bleeding, melena, change in bowels Genitourinary: No dysuria, hematuria, pyuria Dermatologic: No rash, pruritus, change in appearance of skin Neurologic: No  dizziness, headache, syncope, seizures, numbness, tingling Psychiatric: No significant anxiety, depression, insomnia Endocrine: No change in hair/skin/nails, excessive thirst, excessive hunger, excessive urination  Hematologic/lymphatic: No significant bruising, lymphadenopathy, abnormal bleeding Allergy/immunology: No itchy/watery eyes, significant sneezing, urticaria, angioedema  Physical exam:  Pertinent or positive findings: He appears suboptimally nourished and somewhat chronically ill.  Pattern alopecia is present.  His mouth tends to stay agape.  Speech pattern is slightly hyponasal.  Slight tachycardia and slight irregularity suggested on cardiac auscultation.  Breath sounds are decreased.  He seems to indicate that the lower quadrants bilaterally are tender to palpation.  Pedal pulses were decreased.  He is weak to opposition in all extremities, especially the left upper extremity.  Interosseous wasting is present.  He has isolated osteoarthritic changes in the fingers.  There is irregular hyperpigmentation over the shins with slight keratotic changes especially of the left shin.  General appearance: no acute distress, increased work of breathing is present.   Lymphatic: No lymphadenopathy about the head, neck, axilla. Eyes: No conjunctival inflammation or lid edema is present. There is no scleral icterus. Ears:  External ear exam shows no significant lesions or deformities.   Nose:  External nasal examination shows no deformity or inflammation. Nasal mucosa are pink and moist without lesions, exudates Oral exam:  Lips and gums are healthy appearing. There is no oropharyngeal erythema or exudate. Neck:  No thyromegaly, masses, tenderness noted.    Heart:  No gallop, murmur, click, rub .  Lungs:  without wheezes, rhonchi, rales, rubs. Abdomen: Bowel sounds are normal. Abdomen is soft with no organomegaly, hernias, masses. GU: Deferred  Extremities:  No cyanosis, clubbing, edema   Neurologic exam :Balance, Rhomberg, finger to nose testing could not be completed due to clinical state Skin: Warm & dry w/o tenting.  See summary under each active problem in the Problem List with associated updated therapeutic plan :  Unspecified protein-calorie malnutrition Total protein 6.3 and albumin  2.8.  Interosseous wasting on physical exam.  Nutritionist to assess at SNF.  Septic shock (HCC) Continue Foley catheter without voiding trial.  Urology follow-up as scheduled.  Presently there is no clinical sign of active infection.  Continue to monitor.  Pressure injury of skin SNF Wound Care Nurse will monitor deep tissue pressure injury of the left heel.  Neurocognitive deficits He asked me if I had done surgery on him when I introduced myself for the admission H&P.  He has little or no comprehension of the diagnoses made other than I had a stent and Foley in. MMSE will be completed at the SNF.  Adjustment disorder with mixed anxiety and depressed mood Psychiatry consulted while he was hospitalized with urosepsis.  Remeron  7.5 mg nightly, Cymbalta , and Ativan  0.5 mg daily as needed were prescribed for acute anxiety.  Continue to monitor at the SNF.   "

## 2025-01-13 NOTE — Assessment & Plan Note (Signed)
 SNF Wound Care Nurse will monitor deep tissue pressure injury of the left heel.

## 2025-01-13 NOTE — Assessment & Plan Note (Signed)
 Continue Foley catheter without voiding trial.  Urology follow-up as scheduled.  Presently there is no clinical sign of active infection.  Continue to monitor.

## 2025-01-13 NOTE — Assessment & Plan Note (Signed)
 Psychiatry consulted while he was hospitalized with urosepsis.  Remeron  7.5 mg nightly, Cymbalta , and Ativan  0.5 mg daily as needed were prescribed for acute anxiety.  Continue to monitor at the SNF.

## 2025-01-13 NOTE — Assessment & Plan Note (Signed)
 He asked me if I had done surgery on him when I introduced myself for the admission H&P.  He has little or no comprehension of the diagnoses made other than I had a stent and Foley in. MMSE will be completed at the SNF.

## 2025-01-13 NOTE — Assessment & Plan Note (Signed)
 Total protein 6.3 and albumin  2.8.  Interosseous wasting on physical exam.  Nutritionist to assess at SNF.

## 2025-01-15 ENCOUNTER — Other Ambulatory Visit (HOSPITAL_COMMUNITY)
Admission: RE | Admit: 2025-01-15 | Discharge: 2025-01-15 | Disposition: A | Discharge status: Home / Self Care | Source: Skilled Nursing Facility | Attending: Adult Health | Admitting: Adult Health

## 2025-01-15 DIAGNOSIS — I10 Essential (primary) hypertension: Secondary | ICD-10-CM | POA: Insufficient documentation

## 2025-01-15 LAB — BASIC METABOLIC PANEL WITH GFR
Anion gap: 14 (ref 5–15)
BUN: 45 mg/dL — ABNORMAL HIGH (ref 8–23)
CO2: 18 mmol/L — ABNORMAL LOW (ref 22–32)
Calcium: 8.9 mg/dL (ref 8.9–10.3)
Chloride: 109 mmol/L (ref 98–111)
Creatinine, Ser: 2.13 mg/dL — ABNORMAL HIGH (ref 0.61–1.24)
GFR, Estimated: 31 mL/min — ABNORMAL LOW
Glucose, Bld: 93 mg/dL (ref 70–99)
Potassium: 5 mmol/L (ref 3.5–5.1)
Sodium: 141 mmol/L (ref 135–145)

## 2025-01-15 LAB — CBC
HCT: 35 % — ABNORMAL LOW (ref 39.0–52.0)
Hemoglobin: 10.7 g/dL — ABNORMAL LOW (ref 13.0–17.0)
MCH: 28.4 pg (ref 26.0–34.0)
MCHC: 30.6 g/dL (ref 30.0–36.0)
MCV: 92.8 fL (ref 80.0–100.0)
Platelets: 233 K/uL (ref 150–400)
RBC: 3.77 MIL/uL — ABNORMAL LOW (ref 4.22–5.81)
RDW: 16.8 % — ABNORMAL HIGH (ref 11.5–15.5)
WBC: 10 K/uL (ref 4.0–10.5)
nRBC: 0 % (ref 0.0–0.2)

## 2025-01-15 LAB — IRON AND TIBC
Iron: 40 ug/dL — ABNORMAL LOW (ref 45–182)
Saturation Ratios: 20 % (ref 17.9–39.5)
TIBC: 200 ug/dL — ABNORMAL LOW (ref 250–450)
UIBC: 160 ug/dL

## 2025-01-26 ENCOUNTER — Non-Acute Institutional Stay (SKILLED_NURSING_FACILITY): Payer: Self-pay | Admitting: Adult Health

## 2025-01-26 ENCOUNTER — Other Ambulatory Visit: Payer: Self-pay | Admitting: Student

## 2025-01-26 DIAGNOSIS — R2689 Other abnormalities of gait and mobility: Secondary | ICD-10-CM

## 2025-01-26 DIAGNOSIS — G629 Polyneuropathy, unspecified: Secondary | ICD-10-CM

## 2025-01-26 NOTE — Progress Notes (Unsigned)
 " Location:  [facility] Room Number: 130 Place of Service:  SNF (31)   CODE STATUS: dnr   Allergies[1]  Chief Complaint  Patient presents with   Acute Visit    Pain management     HPI:  He is having neck pain. Per patient he is awaiting an MRI of his neck. He states that the pain is severe especially when he moves his neck. He describes that pain as sharp and tingling in nature. He tells me that the pain is interfering with his quality of life.   Past Medical History:  Diagnosis Date   Anxiety    takes Valium  as needed   Arthritis    Bladder cancer (HCC)    takes Rapaflo daily   Blood dyscrasia    07/08/16: pt told he was a free bleeder after bleeding a lot when derm cut off mole on forehead. No excessive bleeding with minor wounds at home, no bleeding problems perioperatively with prior surgeries.   Chronic back pain    spondylolisthesis   Cluster headaches    Depression    takes Lexapro  daily   Essential hypertension, benign    takes Diovan -HCT daily   Hearing loss    hearing aids   History of kidney stones    Hx of complications due to general anesthesia    Aborted surgery 07/15/2016 due to hypotension per pt   Intertrochanteric fracture of left femur (HCC) 02/09/2023   Joint pain    Kidney stone 03/2019   passed stone   Obstructive sleep apnea on CPAP    uses CPAP @ night   Parkinsonism (HCC)    Pneumonia    several times--last time about 3-4 yrs ago   Pre-diabetes    Prediabetes    Syncopal episodes    Thyroid  condition     Past Surgical History:  Procedure Laterality Date   ANTERIOR CERVICAL DECOMP/DISCECTOMY FUSION N/A 06/05/2013   Procedure:  C3-4 Anterior Cervical Discectomy and Fusion, Allograft, Plate;  Surgeon: Oneil JAYSON Herald, MD;  Location: [facility] OR;  Service: Orthopedics;  Laterality: N/A;  C3-4 Anterior Cervical Discectomy and Fusion, Allograft, Plate   Arthroscopic knee surgery     BACK SURGERY      x 2   BLADDER SURGERY      x 5 to remove tumor   CARPAL TUNNEL RELEASE     CATARACT EXTRACTION W/PHACO  12/19/2012   Procedure: CATARACT EXTRACTION PHACO AND INTRAOCULAR LENS PLACEMENT (IOC);  Surgeon: Cherene Mania, MD;  Location: [facility] ORS;  Service: Ophthalmology;  Laterality: Left;  CDE: 26.80   CATARACT EXTRACTION W/PHACO  01/09/2013   Procedure: CATARACT EXTRACTION PHACO AND INTRAOCULAR LENS PLACEMENT (IOC);  Surgeon: Cherene Mania, MD;  Location: [facility]S;  Service: Ophthalmology;  Laterality: Right;  CDE:22.83   CERVICAL FUSION  06/05/2013   C 3  C4   CYSTOSCOPY     CYSTOSCOPY W/ URETERAL STENT PLACEMENT Bilateral 02/12/2020   Procedure: CYSTOSCOPY WITH RETROGRADE PYELOGRAM/URETERAL STENT PLACEMENT;  Surgeon: Watt Rush, MD;  Location: [facility]S;  Service: Urology;  Laterality: Bilateral;   CYSTOSCOPY W/ URETERAL STENT PLACEMENT Right 12/25/2024   Procedure: CYSTOSCOPY, WITH RETROGRADE PYELOGRAM AND URETERAL STENT INSERTION;  Surgeon: Watt Rush, MD;  Location: [facility] OR;  Service: Urology;  Laterality: Right;   INTRAMEDULLARY (IM) NAIL INTERTROCHANTERIC Left 02/10/2023   Procedure: LEFT INTRAMEDULLARY (IM) NAILING OF LEFT FEMUR;  Surgeon: Kendal Franky SQUIBB, MD;  Location: [facility] OR;  Service: Orthopedics;  Laterality: Left;  LAMINECTOMY WITH POSTERIOR LATERAL ARTHRODESIS LEVEL 4 N/A 12/13/2024   Procedure: Thoracic Seven-Thoracic Eight - Thoracic Eight-Thoracic Nine - Thoracic Nine-Thoracic Ten, Thoracic Ten-Thoracic Eleven pedicle screw fixation and posterolateral arthrodesis decompression of Thoracic Eight;  Surgeon: Onetha Kuba, MD;  Location: [facility] OR;  Service: Neurosurgery;  Laterality: N/A;  T7-T8 - T8-T9 - T9-T10, T10-11 pedicle screw fixation and posterolateral arthrodesis  decompress   LITHOTRIPSY  02/2020   REFRACTIVE SURGERY     Hx: of   RETINAL DETACHMENT SURGERY     TOTAL KNEE ARTHROPLASTY Left 02/01/2018   Procedure: TOTAL KNEE ARTHROPLASTY;  Surgeon: Margrette Taft BRAVO, MD;  Location: [facility];  Service: Orthopedics;   Laterality: Left;   TRANSURETHRAL RESECTION OF BLADDER TUMOR Right 07/27/2015   Procedure: TRANSURETHRAL RESECTION OF BLADDER TUMOR (TURBT);  Surgeon: Arlena Gal, MD;  Location: [facility];  Service: Urology;  Laterality: Right;   TRANSURETHRAL RESECTION OF BLADDER TUMOR WITH MITOMYCIN -C N/A 01/25/2023   Procedure: TRANSURETHRAL RESECTION OF BLADDER TUMOR WITH GEMCITABINE ;  Surgeon: Carolee Sherwood JONETTA DOUGLAS, MD;  Location: [facility];  Service: Urology;  Laterality: N/A;   wisdom tooth extraction      Social History   Socioeconomic History   Marital status: Married    Spouse name: Pat    Number of children: 0   Years of education: 13   Highest education level: Not on file  Occupational History   Occupation: Retired    Comment: med [employer]    Employer: UNEMPLOYED  Tobacco Use   Smoking status: Former    Current packs/day: 0.00    Average packs/day: 0.3 packs/day for 1 year (0.3 ttl pk-yrs)    Types: Cigarettes, Cigars    Start date: 12/31/1975    Quit date: 12/30/1976    Years since quitting: 48.1    Passive exposure: Past   Smokeless tobacco: Never   Tobacco comments:    1985  Vaping Use   Vaping status: Never Used  Substance and Sexual Activity   Alcohol  use: No    Comment: quit 1990   Drug use: No   Sexual activity: Yes  Other Topics Concern   Not on file  Social History Narrative   Lives with wife    caffeine- sodas   Right handed   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    Social Drivers of Health   Tobacco Use: Medium Risk (01/12/2025)   Patient History    Smoking Tobacco Use: Former    Smokeless Tobacco Use: Never    Passive Exposure: Past  Physicist, Medical Strain: Not on file  Food Insecurity: No Food Insecurity (12/29/2024)   Epic    Worried About Programme Researcher, Broadcasting/film/video in the Last Year: Never true    Ran Out of Food in the Last Year: Never true  Transportation Needs: No Transportation Needs (12/29/2024)   Epic    Lack of Transportation (Medical): No     Lack of Transportation (Non-Medical): No  Physical Activity: Not on file  Stress: Not on file  Social Connections: Moderately Isolated (12/29/2024)   Social Connection and Isolation Panel    Frequency of Communication with Friends and Family: More than three times a week    Frequency of Social Gatherings with Friends and Family: Never    Attends Religious Services: Never    Database Administrator or Organizations: No    Attends Banker Meetings: Never    Marital Status: Married  Catering Manager Violence: Not At Risk (  12/29/2024)   Epic    Fear of Current or Ex-Partner: No    Emotionally Abused: No    Physically Abused: No    Sexually Abused: No  Depression (PHQ2-9): High Risk (07/26/2023)   Depression (PHQ2-9)    PHQ-2 Score: 18  Alcohol  Screen: Not on file  Housing: Low Risk (12/29/2024)   Epic    Unable to Pay for Housing in the Last Year: No    Number of Times Moved in the Last Year: 0    Homeless in the Last Year: No  Utilities: Not At Risk (12/29/2024)   Epic    Threatened with loss of utilities: No  Health Literacy: Not on file   Family History  Problem Relation Age of Onset   Hypertension Father    Cancer Father        Prostate   Depression Mother    Hypertension Mother    Alcohol  abuse Mother    COPD Mother        passed 07-08-17   Hypertension Sister    Anxiety disorder Sister    Alcohol  abuse Maternal Grandfather    ADD / ADHD Neg Hx    Bipolar disorder Neg Hx    Dementia Neg Hx    Drug abuse Neg Hx    OCD Neg Hx    Paranoid behavior Neg Hx    Schizophrenia Neg Hx    Seizures Neg Hx    Sexual abuse Neg Hx    Physical abuse Neg Hx       VITAL SIGNS BP 130/72   Pulse 77   Temp 98.6 F (37 C)   Resp 20   Ht 6' 0.5 (1.842 m)   Wt 176 lb 11.2 oz (80.2 kg)   SpO2 99%   BMI 23.64 kg/m   Outpatient Encounter Medications as of 01/26/2025  Medication Sig   acetaminophen  (TYLENOL ) 650 MG CR tablet Take 650 mg by mouth every 6 (six) hours.    carvedilol  (COREG ) 12.5 MG tablet Take 1 tablet (12.5 mg total) by mouth 2 (two) times daily.   dorzolamide -timolol  (COSOPT ) 2-0.5 % ophthalmic solution Place 1 drop into both eyes 2 (two) times daily.   DULoxetine  (CYMBALTA ) 60 MG capsule Take 1 capsule (60 mg total) by mouth 2 (two) times daily.   latanoprost  (XALATAN ) 0.005 % ophthalmic solution Place 1 drop into both eyes at bedtime.   lidocaine  (LIDODERM ) 5 % Place 1 patch onto the skin daily. Apply to back. Apply to intact skin to cover the most painful area.   magic mouthwash w/lidocaine  SOLN Take 10 mLs by mouth 3 (three) times daily as needed for mouth pain. Suspension contains equal amounts of Maalox Extra Strength, nystatin , diphenhydramine  and lidocaine .   melatonin 5 MG TABS Take 5 mg by mouth at bedtime.   methocarbamol  (ROBAXIN ) 500 MG tablet Take 500 mg by mouth every 6 (six) hours.   mirtazapine  (REMERON ) 7.5 MG tablet Take 1 tablet (7.5 mg total) by mouth at bedtime.   Multiple Vitamin (MULTIVITAMIN) tablet Take 1 tablet by mouth daily.   Multiple Vitamins-Minerals (PRESERVISION AREDS PO) Take 1 capsule by mouth in the morning and at bedtime.   Omega-3 Fatty Acids (FISH OIL) 1000 MG CAPS Take 1 capsule by mouth daily.   polyethylene glycol (MIRALAX  / GLYCOLAX ) 17 g packet Take 17 g by mouth daily.   pravastatin  (PRAVACHOL ) 10 MG tablet Take 10 mg by mouth at bedtime.   Protein (PROSOURCE PO) Take 30 mLs by mouth  3 (three) times daily.   risperiDONE  (RISPERDAL ) 0.5 MG tablet Take 1 tablet (0.5 mg total) by mouth at bedtime.   No facility-administered encounter medications on file as of 01/26/2025.     SIGNIFICANT DIAGNOSTIC EXAMS   LABS  12-09-24: wbc 12.4; hgb 13.5; hct 41.5; mcv 91.8 plt 253; glucose 122; bun 28; creat 2.04; k+ 3.6; na++ 412; ca 9.1; gfr 33; protein 7.0 albumin  4.0 12-13-24: wbc 10.4; hgb 11.2; hct 34.3; mcv 91.0 plt 214; glucose 103; bun 24; creat 1.75; k+ 3.8; an++ 139; ca 8.5 gfr 39; protein 6.5  albumin  2.8 12-19-24: wbc 9.1; hgb 11.0; hct 33.6; mcv 89.4 plt 298; glucose 97; bun 27; creat 1.59; k+ 3.7; na++ 137; ca 8.4 gfr 44;  12-25-24: wbc 12.6; hgb 12.8; hct 39.7; mcv 88.4 plt 392; glucose 82; bun 89; creat 3.90 ;k+ 4.2; na++ 138; ca 8.3 gfr 15; alk phos 257; protein 6.4 albumin  2.8: urine culture: e-coli; enterococcus; klebsiella pneumoniae 12-30-24: wbc 26.2; hgb 10.7; hct 33.8; mcv 90.1 plt 264; glucose 95; bun 55; creat 2.21; k+ 4.9; na++ 145; ca 8.4 gfr 30; alk phos 147; protein 5.9 albumin  2.6; vitamin B12: 1023; folate 8.9; tsh 2.820 01-10-25: wbc 9.0; hgb 10.0; hct 31.5; mcv 90.0 plt 262; glucose 95; bun 32; creat 2.63; k+ 4.6; na++ 141; ca 8.9; gfr 24   Review of Systems  Constitutional:  Negative for malaise/fatigue.  Respiratory:  Negative for cough and shortness of breath.   Cardiovascular:  Negative for chest pain, palpitations and leg swelling.  Gastrointestinal:  Negative for abdominal pain, constipation and heartburn.  Musculoskeletal:  Positive for joint pain and myalgias. Negative for back pain.  Skin: Negative.   Neurological:  Negative for dizziness.  Psychiatric/Behavioral:  The patient is not nervous/anxious.     Physical Exam Constitutional:      General: He is not in acute distress.    Appearance: He is well-developed. He is not diaphoretic.  Neck:     Thyroid : No thyromegaly.  Cardiovascular:     Rate and Rhythm: Normal rate and regular rhythm.     Heart sounds: Normal heart sounds.  Pulmonary:     Effort: Pulmonary effort is normal. No respiratory distress.     Breath sounds: Normal breath sounds.  Abdominal:     General: Bowel sounds are normal. There is no distension.     Palpations: Abdomen is soft.     Tenderness: There is no abdominal tenderness.  Musculoskeletal:     Cervical back: Neck supple.     Right lower leg: No edema.     Left lower leg: No edema.     Comments: Has stiffness in neck; is tender to touch.   Lymphadenopathy:      Cervical: No cervical adenopathy.  Skin:    General: Skin is warm and dry.  Neurological:     Mental Status: He is alert. Mental status is at baseline.  Psychiatric:        Mood and Affect: Mood normal.       ASSESSMENT/ PLAN:  TODAY  Neuropathy: will begin gabapentin  100 mg twice daily    Barnie Seip NP [facility]  call [phone]      [1]  Allergies Allergen Reactions   Poison Oak Extract Hives and Itching   Sulfa Antibiotics Other (See Comments)    Unknown    Sulfonamide Derivatives Itching and Rash   "

## 2025-01-30 ENCOUNTER — Other Ambulatory Visit (HOSPITAL_COMMUNITY): Payer: Self-pay | Admitting: Student

## 2025-01-30 ENCOUNTER — Non-Acute Institutional Stay: Payer: Self-pay | Admitting: Adult Health

## 2025-01-30 DIAGNOSIS — R29818 Other symptoms and signs involving the nervous system: Secondary | ICD-10-CM

## 2025-01-30 DIAGNOSIS — R2689 Other abnormalities of gait and mobility: Secondary | ICD-10-CM

## 2025-01-30 DIAGNOSIS — F418 Other specified anxiety disorders: Secondary | ICD-10-CM

## 2025-01-31 ENCOUNTER — Non-Acute Institutional Stay (SKILLED_NURSING_FACILITY): Payer: Self-pay | Admitting: Internal Medicine

## 2025-01-31 ENCOUNTER — Encounter: Payer: Self-pay | Admitting: Internal Medicine

## 2025-01-31 DIAGNOSIS — N1832 Chronic kidney disease, stage 3b: Secondary | ICD-10-CM | POA: Diagnosis not present

## 2025-01-31 DIAGNOSIS — R4189 Other symptoms and signs involving cognitive functions and awareness: Secondary | ICD-10-CM | POA: Diagnosis not present

## 2025-01-31 DIAGNOSIS — F418 Other specified anxiety disorders: Secondary | ICD-10-CM

## 2025-01-31 DIAGNOSIS — R0600 Dyspnea, unspecified: Secondary | ICD-10-CM | POA: Diagnosis not present

## 2025-01-31 DIAGNOSIS — R29818 Other symptoms and signs involving the nervous system: Secondary | ICD-10-CM

## 2025-01-31 DIAGNOSIS — I959 Hypotension, unspecified: Secondary | ICD-10-CM | POA: Diagnosis not present

## 2025-01-31 NOTE — Assessment & Plan Note (Signed)
 Check CBC to R/O severe anemia.  Decrease carvedilol  to 6.25 mg bid and monitor blood pressure.  DC Robaxin .

## 2025-01-31 NOTE — Assessment & Plan Note (Addendum)
 He is repeatedly crying out complaining of pain.  Pain medicines have been adjusted. Although he complains of pain there is no change in his facial demeanor or vital signs such as tachycardia or hypertension. Psychiatry reevaluation is clinically indicated.

## 2025-01-31 NOTE — Assessment & Plan Note (Signed)
 Current creatinine is 2.13 with a GFR of 31 indicating low stage IIIb CKD.  BMET will be updated.

## 2025-01-31 NOTE — Progress Notes (Unsigned)
 "   NURSING HOME LOCATION:  [facility] ROOM NUMBER: [id number]  CODE STATUS: DNR  PCP: Shona Norleen PEDLAR, MD   This is a nursing facility follow up visit for specific acute issue of congestive cough and dyspnea..  Interim medical record and care since last SNF visit was updated with review of diagnostic studies and change in clinical status since last visit were documented.  HPI: Staff reports that he has been complaining of dyspnea on has exhibited a congested cough despite O2 sats of 94% on room air.  It was reported that he is supposed to be on nectar thick liquids but had been given liquids through a straw. The major pattern is that he screams out for help and complains of pain. Chart was reviewed.  [facility] in [place] has been following the patient.  She has diagnosed him as having major depression, recurrent, moderately severe.  She had seen him in person the last time on 07/25/2024.  He had apparently not been taking his medications for an extended period of time.  The depression is been associated with a 15 pound weight loss.  She believes that he had been taking the Cymbalta  sporadically at best. CT scan of the head last month revealed proportional prominence of ventricles and sulci, consistent with diffuse cerebral parenchymal volume loss.  Periventricular and subcortical white matter hypoattenuation was present consistent with moderate chronic ischemic microvascular disease.  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 His B CAT MMSE here at the facility revealed a score 31 out of 50 indicating mild dementia.  The CNS imaging would suggest that he has vascular dementia. His most recent labs 01/15/2025 revealed CKD stage IIIb with a creatinine 2.13 and eGFR 31.  Anemia was stable to improved with H/H of 10.7/35.  Review of systems: His complaint is but without specific definition.  He does seem to  validate some dysphagia.  Constitutional: No fever, significant weight change, fatigue  Eyes: No redness, discharge, pain, vision change ENT/mouth: No nasal congestion,  purulent discharge, earache, change in hearing, sore throat  Cardiovascular: No chest pain, palpitations, paroxysmal nocturnal dyspnea, claudication, edema  Respiratory: No cough, sputum production, hemoptysis, DOE, significant snoring, apnea   Gastrointestinal: No heartburn, dysphagia, abdominal pain, nausea /vomiting, rectal bleeding, melena, change in bowels Genitourinary: No dysuria, hematuria, pyuria, incontinence, nocturia Musculoskeletal: No joint stiffness, joint swelling, weakness, pain Dermatologic: No rash, pruritus, change in appearance of skin Neurologic: No dizziness, headache, syncope, seizures, numbness, tingling Psychiatric: No significant anxiety, depression, insomnia, anorexia Endocrine: No change in hair/skin/nails, excessive thirst, excessive hunger, excessive urination  Hematologic/lymphatic: No significant bruising, lymphadenopathy, abnormal bleeding Allergy/immunology: No itchy/watery eyes, significant sneezing, urticaria, angioedema  Physical exam:  Pertinent or positive findings: He appears almost cachectic.  Pattern alopecia is present.  He has a full mustache.  Facies are totally blank and the only movement is of his eyes with intermittent minor vertical deviation.  His mouth remains agape; oral tissues appear dry.  His speech is garbled and almost unintelligible.  Breath sounds are decreased; slight rales were suggested on the right.  Heart sounds are markedly distant.  There is minimal irregularity which is documented by a occasional pause of the radial pulse.  Foley catheter is in place.  Pedal pulses are essentially nonpalpable.  As noted there is essentially no spontaneous musculoskeletal activity.  He did raise his hand and greeting to his pastor as we were leaving.  He has interosseous wasting and  limb atrophy.  There are hyperpigmentation over the shins greater on the left than the right with slight keratotic character. General appearance: Adequately nourished; no acute distress, increased work of breathing is present.   Lymphatic: No lymphadenopathy about the head, neck, axilla. Eyes: No conjunctival inflammation or lid edema is present. There is no scleral icterus. Ears:  External ear exam shows no significant lesions or deformities.   Nose:  External nasal examination shows no deformity or inflammation. Nasal mucosa are pink and moist without lesions, exudates Oral exam:  Lips and gums are healthy appearing. There is no oropharyngeal erythema or exudate. Neck:  No thyromegaly, masses, tenderness noted.    Heart:  Normal rate and regular rhythm. S1 and S2 normal without gallop, murmur, click, rub .  Lungs: Chest clear to auscultation without wheezes, rhonchi, rales, rubs. Abdomen: Bowel sounds are normal. Abdomen is soft and nontender with no organomegaly, hernias, masses. GU: Deferred  Extremities:  No cyanosis, clubbing, edema  Neurologic exam : Cn 2-7 intact Strength equal  in upper & lower extremities Balance, Rhomberg, finger to nose testing could not be completed due to clinical state Deep tendon reflexes are equal Skin: Warm & dry w/o tenting. No significant lesions or rash.  See summary under each active problem in the Problem List with associated updated therapeutic plan  "

## 2025-01-31 NOTE — Assessment & Plan Note (Addendum)
 Portable chest x-ray.  Maintain O2 sats greater than 90%.  Repeat CBC and differential.

## 2025-02-01 ENCOUNTER — Encounter: Payer: Self-pay | Admitting: Internal Medicine

## 2025-02-01 ENCOUNTER — Other Ambulatory Visit: Payer: Self-pay

## 2025-02-01 ENCOUNTER — Encounter (HOSPITAL_COMMUNITY): Payer: Self-pay

## 2025-02-01 ENCOUNTER — Inpatient Hospital Stay (HOSPITAL_COMMUNITY)
Admission: EM | Admit: 2025-02-01 | Discharge: 2025-02-02 | Disposition: A | Source: Home / Self Care | Attending: Internal Medicine | Admitting: Internal Medicine

## 2025-02-01 ENCOUNTER — Emergency Department (HOSPITAL_COMMUNITY)

## 2025-02-01 DIAGNOSIS — I10 Essential (primary) hypertension: Secondary | ICD-10-CM

## 2025-02-01 DIAGNOSIS — N184 Chronic kidney disease, stage 4 (severe): Secondary | ICD-10-CM

## 2025-02-01 DIAGNOSIS — E782 Mixed hyperlipidemia: Secondary | ICD-10-CM

## 2025-02-01 DIAGNOSIS — J9601 Acute respiratory failure with hypoxia: Principal | ICD-10-CM

## 2025-02-01 DIAGNOSIS — A419 Sepsis, unspecified organism: Secondary | ICD-10-CM | POA: Diagnosis present

## 2025-02-01 DIAGNOSIS — G9341 Metabolic encephalopathy: Secondary | ICD-10-CM

## 2025-02-01 DIAGNOSIS — R7401 Elevation of levels of liver transaminase levels: Secondary | ICD-10-CM

## 2025-02-01 DIAGNOSIS — J189 Pneumonia, unspecified organism: Secondary | ICD-10-CM

## 2025-02-01 DIAGNOSIS — R7989 Other specified abnormal findings of blood chemistry: Secondary | ICD-10-CM

## 2025-02-01 DIAGNOSIS — N179 Acute kidney failure, unspecified: Secondary | ICD-10-CM

## 2025-02-01 DIAGNOSIS — N189 Chronic kidney disease, unspecified: Secondary | ICD-10-CM

## 2025-02-01 LAB — BLOOD GAS, VENOUS
Acid-base deficit: 9.8 mmol/L — ABNORMAL HIGH (ref 0.0–2.0)
Bicarbonate: 16.7 mmol/L — ABNORMAL LOW (ref 20.0–28.0)
Drawn by: 53361
O2 Saturation: 41.7 %
Patient temperature: 37.2
pCO2, Ven: 38 mmHg — ABNORMAL LOW (ref 44–60)
pH, Ven: 7.25 (ref 7.25–7.43)
pO2, Ven: <31 mmHg — CL (ref 32–45)

## 2025-02-01 LAB — URINALYSIS, W/ REFLEX TO CULTURE (INFECTION SUSPECTED)
Bilirubin Urine: NEGATIVE
Glucose, UA: NEGATIVE mg/dL
Ketones, ur: NEGATIVE mg/dL
Nitrite: NEGATIVE
Protein, ur: 100 mg/dL — AB
Specific Gravity, Urine: 1.016 (ref 1.005–1.030)
WBC, UA: >50 WBC/hpf (ref 0–5)
pH: 5 (ref 5.0–8.0)

## 2025-02-01 LAB — COMPREHENSIVE METABOLIC PANEL WITH GFR
ALT: 64 U/L — ABNORMAL HIGH (ref 0–44)
AST: 46 U/L — ABNORMAL HIGH (ref 15–41)
Albumin: 3.3 g/dL — ABNORMAL LOW (ref 3.5–5.0)
Alkaline Phosphatase: 211 U/L — ABNORMAL HIGH (ref 38–126)
Anion gap: 17 — ABNORMAL HIGH (ref 5–15)
BUN: 115 mg/dL — ABNORMAL HIGH (ref 8–23)
CO2: 18 mmol/L — ABNORMAL LOW (ref 22–32)
Calcium: 9.8 mg/dL (ref 8.9–10.3)
Chloride: 106 mmol/L (ref 98–111)
Creatinine, Ser: 3.17 mg/dL — ABNORMAL HIGH (ref 0.61–1.24)
GFR, Estimated: 19 mL/min — ABNORMAL LOW
Glucose, Bld: 121 mg/dL — ABNORMAL HIGH (ref 70–99)
Potassium: 5.6 mmol/L — ABNORMAL HIGH (ref 3.5–5.1)
Sodium: 141 mmol/L (ref 135–145)
Total Bilirubin: 0.3 mg/dL (ref 0.0–1.2)
Total Protein: 8.9 g/dL — ABNORMAL HIGH (ref 6.5–8.1)

## 2025-02-01 LAB — CBC WITH DIFFERENTIAL/PLATELET
Abs Immature Granulocytes: 0.28 10*3/uL — ABNORMAL HIGH (ref 0.00–0.07)
Basophils Absolute: 0.1 10*3/uL (ref 0.0–0.1)
Basophils Relative: 0 %
Eosinophils Absolute: 0.1 10*3/uL (ref 0.0–0.5)
Eosinophils Relative: 0 %
HCT: 37.2 % — ABNORMAL LOW (ref 39.0–52.0)
Hemoglobin: 11.5 g/dL — ABNORMAL LOW (ref 13.0–17.0)
Immature Granulocytes: 1 %
Lymphocytes Relative: 6 %
Lymphs Abs: 1.6 10*3/uL (ref 0.7–4.0)
MCH: 28.4 pg (ref 26.0–34.0)
MCHC: 30.9 g/dL (ref 30.0–36.0)
MCV: 91.9 fL (ref 80.0–100.0)
Monocytes Absolute: 0.8 10*3/uL (ref 0.1–1.0)
Monocytes Relative: 3 %
Neutro Abs: 25.2 10*3/uL — ABNORMAL HIGH (ref 1.7–7.7)
Neutrophils Relative %: 90 %
Platelets: 535 10*3/uL — ABNORMAL HIGH (ref 150–400)
RBC: 4.05 MIL/uL — ABNORMAL LOW (ref 4.22–5.81)
RDW: 16.8 % — ABNORMAL HIGH (ref 11.5–15.5)
WBC: 28 10*3/uL — ABNORMAL HIGH (ref 4.0–10.5)
nRBC: 0 % (ref 0.0–0.2)

## 2025-02-01 LAB — PROTIME-INR
INR: 1.3 — ABNORMAL HIGH (ref 0.8–1.2)
Prothrombin Time: 16.4 s — ABNORMAL HIGH (ref 11.4–15.2)

## 2025-02-01 LAB — RESP PANEL BY RT-PCR (RSV, FLU A&B, COVID)  RVPGX2
Influenza A by PCR: NEGATIVE
Influenza B by PCR: NEGATIVE
Resp Syncytial Virus by PCR: NEGATIVE
SARS Coronavirus 2 by RT PCR: NEGATIVE

## 2025-02-01 LAB — PHOSPHORUS: Phosphorus: 5 mg/dL — ABNORMAL HIGH (ref 2.5–4.6)

## 2025-02-01 LAB — LACTIC ACID, PLASMA
Lactic Acid, Venous: 1.1 mmol/L (ref 0.5–1.9)
Lactic Acid, Venous: 1.6 mmol/L (ref 0.5–1.9)

## 2025-02-01 LAB — MAGNESIUM: Magnesium: 2 mg/dL (ref 1.7–2.4)

## 2025-02-01 LAB — TROPONIN T, HIGH SENSITIVITY
Troponin T High Sensitivity: 52 ng/L — ABNORMAL HIGH (ref 0–19)
Troponin T High Sensitivity: 48 ng/L — ABNORMAL HIGH (ref 0–19)

## 2025-02-01 LAB — HIV ANTIBODY (ROUTINE TESTING W REFLEX): HIV Screen 4th Generation wRfx: NONREACTIVE

## 2025-02-01 MED ORDER — POLYVINYL ALCOHOL 1.4 % OP SOLN: 1.0000 [drp] | Freq: Four times a day (QID) | OPHTHALMIC | Status: DC | PRN

## 2025-02-01 MED ORDER — ONDANSETRON HCL 4 MG/2ML IJ SOLN
4.0000 mg | Freq: Once | INTRAMUSCULAR | Status: AC
  Administered 2025-02-01: 4 mg via INTRAVENOUS
  Filled 2025-02-01: qty 2

## 2025-02-01 MED ORDER — LACTATED RINGERS IV BOLUS
500.0000 mL | Freq: Once | INTRAVENOUS | Status: AC
  Administered 2025-02-01: 500 mL via INTRAVENOUS

## 2025-02-01 MED ORDER — ACETAMINOPHEN 650 MG RE SUPP: 650.0000 mg | Freq: Four times a day (QID) | RECTAL | Status: DC | PRN

## 2025-02-01 MED ORDER — MORPHINE SULFATE (PF) 4 MG/ML IV SOLN
4.0000 mg | Freq: Once | INTRAVENOUS | Status: AC
  Administered 2025-02-01: 4 mg via INTRAVENOUS
  Filled 2025-02-01: qty 1

## 2025-02-01 MED ORDER — MORPHINE SULFATE (PF) 2 MG/ML IV SOLN: 1.0000 mg | INTRAVENOUS | Status: DC | PRN

## 2025-02-01 MED ORDER — LORAZEPAM 2 MG/ML PO CONC: 1.0000 mg | ORAL | Status: DC | PRN

## 2025-02-01 MED ORDER — MORPHINE SULFATE (CONCENTRATE) 10 MG /0.5 ML PO SOLN: 5.0000 mg | ORAL | Status: DC | PRN

## 2025-02-01 MED ORDER — LORAZEPAM 1 MG PO TABS: 1.0000 mg | ORAL_TABLET | ORAL | Status: DC | PRN

## 2025-02-01 MED ORDER — ONDANSETRON HCL 4 MG PO TABS: 4.0000 mg | ORAL_TABLET | Freq: Four times a day (QID) | ORAL | Status: DC | PRN

## 2025-02-01 MED ORDER — GLYCOPYRROLATE 0.2 MG/ML IJ SOLN: 0.2000 mg | INTRAMUSCULAR | Status: DC | PRN

## 2025-02-01 MED ORDER — ONDANSETRON HCL 4 MG/2ML IJ SOLN: 4.0000 mg | Freq: Four times a day (QID) | INTRAMUSCULAR | Status: DC | PRN

## 2025-02-01 MED ORDER — LACTATED RINGERS IV SOLN: INTRAVENOUS | Status: DC

## 2025-02-01 MED ORDER — DM-GUAIFENESIN ER 30-600 MG PO TB12
1.0000 | ORAL_TABLET | Freq: Two times a day (BID) | ORAL | Status: DC
  Filled 2025-02-01 (×2): qty 1

## 2025-02-01 MED ORDER — LORAZEPAM 2 MG/ML IJ SOLN: 1.0000 mg | INTRAMUSCULAR | Status: DC | PRN

## 2025-02-01 MED ORDER — SODIUM CHLORIDE 0.9 % IV BOLUS
1000.0000 mL | Freq: Once | INTRAVENOUS | Status: AC
  Administered 2025-02-01: 1000 mL via INTRAVENOUS

## 2025-02-01 MED ORDER — GLYCOPYRROLATE 1 MG PO TABS: 1.0000 mg | ORAL_TABLET | ORAL | Status: DC | PRN

## 2025-02-01 MED ORDER — HALOPERIDOL 0.5 MG PO TABS: 0.5000 mg | ORAL_TABLET | ORAL | Status: DC | PRN

## 2025-02-01 MED ORDER — SODIUM CHLORIDE 0.9 % IV SOLN
2.0000 g | Freq: Once | INTRAVENOUS | Status: AC
  Administered 2025-02-01: 2 g via INTRAVENOUS
  Filled 2025-02-01: qty 20

## 2025-02-01 MED ORDER — SODIUM CHLORIDE 0.9 % IV SOLN
3.0000 g | Freq: Two times a day (BID) | INTRAVENOUS | Status: DC
  Administered 2025-02-01: 3 g via INTRAVENOUS
  Filled 2025-02-01: qty 8

## 2025-02-01 MED ORDER — SODIUM CHLORIDE 0.9 % IV SOLN
100.0000 mg | Freq: Two times a day (BID) | INTRAVENOUS | Status: DC
  Filled 2025-02-01: qty 100

## 2025-02-01 MED ORDER — HALOPERIDOL LACTATE 5 MG/ML IJ SOLN: 0.5000 mg | INTRAMUSCULAR | Status: DC | PRN

## 2025-02-01 MED ORDER — HALOPERIDOL LACTATE 2 MG/ML PO CONC: 0.5000 mg | ORAL | Status: DC | PRN

## 2025-02-01 MED ORDER — SODIUM CHLORIDE 0.9 % IV SOLN
500.0000 mg | Freq: Once | INTRAVENOUS | Status: AC
  Administered 2025-02-01: 500 mg via INTRAVENOUS
  Filled 2025-02-01: qty 5

## 2025-02-01 MED ORDER — MORPHINE SULFATE (PF) 2 MG/ML IV SOLN
1.0000 mg | Freq: Once | INTRAVENOUS | Status: AC
  Administered 2025-02-01: 1 mg via INTRAVENOUS
  Filled 2025-02-01: qty 1

## 2025-02-01 MED ORDER — LACTATED RINGERS IV BOLUS
1000.0000 mL | Freq: Once | INTRAVENOUS | Status: AC
  Administered 2025-02-01: 1000 mL via INTRAVENOUS

## 2025-02-01 MED ORDER — ENOXAPARIN SODIUM 30 MG/0.3ML IJ SOSY
30.0000 mg | PREFILLED_SYRINGE | INTRAMUSCULAR | Status: DC
  Administered 2025-02-01: 30 mg via SUBCUTANEOUS
  Filled 2025-02-01: qty 0.3

## 2025-02-01 MED ORDER — ACETAMINOPHEN 325 MG PO TABS: 650.0000 mg | ORAL_TABLET | Freq: Four times a day (QID) | ORAL | Status: DC | PRN

## 2025-02-01 NOTE — ED Notes (Signed)
 Pt not being placed on bipap at this time d/t nausea. RT to try heated High flow.

## 2025-02-01 NOTE — Sepsis Progress Note (Signed)
 Elink monitoring for the code sepsis protocol.

## 2025-02-01 NOTE — Progress Notes (Signed)
 Patient desatted into the 80s (probably due to the transport and moving into the bed on 300) so this RT put patient back up to 100% FiO2 on the HHFNC.

## 2025-02-01 NOTE — Progress Notes (Signed)
 Patient admitted after midnight, please see H&P.  Here with SOB from suspected aspiration PNA.  Was just d/c'd from [facility] to a rehab.  Called wife but no answer. Will re-engage palliative care Harlene Bowl DO

## 2025-02-01 NOTE — ED Notes (Signed)
 Wife states the foley was last changed on Monday of this week 01/29/25.

## 2025-02-01 NOTE — ED Triage Notes (Signed)
 [facility].  Patient is on nectar thick liquids but has been getting liquid via straw. Per staffing, gurgles and crackles were heard, patient RR was >24. Elevated HR. 80% on room air. Ems placed him on 15: NRB 90% Patient has dementia and is alert to person only Staffing reports he is DNR but he  doesn't have a yellow or pink form. He just has a signed No code sheet from the facility.

## 2025-02-01 NOTE — ED Notes (Signed)
 Respiratory contacted to place pt on bi pap. Pt sats remain 87-88% on 15L NRB.

## 2025-02-01 NOTE — ED Notes (Signed)
 EDP at bedside giving family an update

## 2025-02-01 NOTE — Patient Instructions (Signed)
 See assessment and plan under each diagnosis in the problem list and acutely for this visit

## 2025-02-01 NOTE — Assessment & Plan Note (Signed)
 He has been exhibiting behaviors manifested as repeatedly screaming to get attention.  When evaluated there does not appear to be an acute issue.

## 2025-02-01 NOTE — TOC Initial Note (Signed)
 Transition of Care [facility]) - Initial/Assessment Note    Patient Details  Name: Zachary [name] MRN: [id number] Date of Birth: 1946/01/04  [facility]) CM/SW Contact:    Sharlyne Stabs, RN Phone Number: 02/01/2025, 11:09 AM  Clinical Narrative:           Patient comes in from [facility], history of bladder cancer, dementia presenting to the ED with chief complaint of shortness of breath. Assessed and long admission [facility] in Dec/ Jan. Eventually discharged to [facility].  Before this event, lived at home with spouse, no DME.  IPCM following after workup to assist with discharge plan.             Barriers to Discharge: Continued Medical Work up   Patient Goals and CMS Choice Patient states their goals for this hospitalization and ongoing recovery are:: Return to [facility] CMS Medicare.gov Compare Post Acute Care list provided to:: Patient Represenative (must comment)   Hope ownership interest in [facility].provided to:: Spouse    Expected Discharge Plan and Services       Living arrangements for the past 2 months: Skilled Nursing Facility     Prior Living Arrangements/Services Living arrangements for the past 2 months: Skilled Nursing Facility Lives with:: Facility Resident       Emotional Assessment       Orientation: : Oriented to Self Alcohol  / Substance Use: Not Applicable Psych Involvement: No (comment)  Admission diagnosis:  Sepsis due to pneumonia (HCC) [J18.9, A41.9] Patient Active Problem List   Diagnosis Date Noted   Sepsis due to pneumonia (HCC) 02/01/2025   Hypotension 01/31/2025   Protein-calorie malnutrition, severe 01/02/2025   Adjustment disorder with mixed anxiety and depressed mood 12/30/2024   Pressure injury of skin 12/30/2024   Acute pyonephrosis 12/27/2024   AKI (acute kidney injury) 12/27/2024   Septic shock (HCC) 12/25/2024   ABLA (acute blood loss anemia) 12/20/2024   Depression with anxiety 12/20/2024   Increased  intraocular pressure, bilateral 12/20/2024   Unspecified protein-calorie malnutrition 12/20/2024   Neurocognitive deficits 12/20/2024   T9 vertebral fracture (HCC) 12/10/2024   Chronic kidney disease (CKD), stage III (moderate) (HCC) 12/10/2024   Neuropathy 04/12/2024   Subclinical hyperthyroidism 09/20/2018   Prediabetes 09/20/2018   S/P total knee replacement, 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 02/07/2018   Primary osteoarthritis of left knee 02/01/2018   Spinal stenosis 04/12/2017   Dysphagia 07/23/2016   Obstructive sleep apnea 07/21/2016   Hyperglycemia 07/21/2016   Spinal stenosis of lumbar region 07/15/2016   Bladder tumor 07/27/2015   Epiretinal membrane (ERM) of left eye 03/14/2014   H/O retinal detachment 03/14/2014   Pseudophakia of both eyes 03/14/2014   Retinal detachment 03/14/2014   Visual distortion 03/14/2014   HNP (herniated nucleus pulposus), cervical 06/05/2013    Class: 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 [place] [address] Phone: [phone] Fax: [phone]  [facility] - [place], [place] - [address] [place] [address] Phone: [phone] Fax: [phone]  [facility] - [place], [place] - [address] [place] [address] Phone: [phone] Fax: [phone]     Social Drivers of Health (SDOH) Social History: SDOH Screenings   Food Insecurity: No Food Insecurity (12/29/2024)  Housing: Low Risk (12/29/2024)  Transportation Needs: No Transportation  Needs (12/29/2024)  Utilities: Not At Risk (12/29/2024)  Depression (PHQ2-9): High Risk (07/26/2023)  Social Connections: Moderately Isolated (12/29/2024)  Tobacco Use: Medium Risk (02/01/2025)   SDOH Interventions:     Readmission Risk Interventions    02/01/2025   11:08 AM 12/10/2024    9:44 AM 02/11/2023   12:02 PM  Readmission Risk Prevention Plan  Medication Screening  Complete   Transportation Screening Complete Complete Complete  PCP or Specialist Appt within 5-7 Days   Complete  PCP or Specialist Appt within 3-5 Days Not Complete    Home Care Screening   Complete  Medication Review (RN CM)   Complete  HRI or Home Care Consult Complete    Social Work Consult for Recovery Care Planning/Counseling Complete    Palliative Care Screening Not Complete    Medication Review Oceanographer) Complete

## 2025-02-01 NOTE — Plan of Care (Signed)
  Problem: Education: Goal: Knowledge of General Education information will improve Description: Including pain rating scale, medication(s)/side effects and non-pharmacologic comfort measures Outcome: Not Progressing   Problem: Health Behavior/Discharge Planning: Goal: Ability to manage health-related needs will improve Outcome: Not Progressing   Problem: Clinical Measurements: Goal: Ability to maintain clinical measurements within normal limits will improve Outcome: Not Progressing Goal: Will remain free from infection Outcome: Not Progressing Goal: Diagnostic test results will improve Outcome: Not Progressing Goal: Respiratory complications will improve Outcome: Not Progressing Goal: Cardiovascular complication will be avoided Outcome: Not Progressing   Problem: Activity: Goal: Risk for activity intolerance will decrease Outcome: Not Progressing   Problem: Nutrition: Goal: Adequate nutrition will be maintained Outcome: Not Progressing   Problem: Coping: Goal: Level of anxiety will decrease Outcome: Not Progressing   Problem: Elimination: Goal: Will not experience complications related to bowel motility Outcome: Not Progressing Goal: Will not experience complications related to urinary retention Outcome: Not Progressing   Problem: Pain Managment: Goal: General experience of comfort will improve and/or be controlled Outcome: Not Progressing   Problem: Safety: Goal: Ability to remain free from injury will improve Outcome: Not Progressing   Problem: Skin Integrity: Goal: Risk for impaired skin integrity will decrease Outcome: Not Progressing   Problem: Education: Goal: Knowledge of the prescribed therapeutic regimen will improve Outcome: Not Progressing   Problem: Coping: Goal: Ability to identify and develop effective coping behavior will improve Outcome: Not Progressing   Problem: Clinical Measurements: Goal: Quality of life will improve Outcome: Not  Progressing   Problem: Respiratory: Goal: Verbalizations of increased ease of respirations will increase Outcome: Not Progressing   Problem: Role Relationship: Goal: Family's ability to cope with current situation will improve Outcome: Not Progressing Goal: Ability to verbalize concerns, feelings, and thoughts to partner or family member will improve Outcome: Not Progressing   Problem: Pain Management: Goal: Satisfaction with pain management regimen will improve Outcome: Not Progressing   Problem: Activity: Goal: Ability to tolerate increased activity will improve Outcome: Not Progressing   Problem: Clinical Measurements: Goal: Ability to maintain a body temperature in the normal range will improve Outcome: Not Progressing   Problem: Respiratory: Goal: Ability to maintain adequate ventilation will improve Outcome: Not Progressing Goal: Ability to maintain a clear airway will improve Outcome: Not Progressing

## 2025-02-01 NOTE — Progress Notes (Signed)
 Came back to bedside to speak with wife.  Wife states that patient has been declining while at rehab---not being able to participate with PT, not eating much.  She says she has had multiple discussions with physicians in the past that have recommended hospice. She would like to transition him to comfort care and pursue placement at a residential hospice facility.  We discussed transitioning to comfort care and the medications we would use to make him comfortable and she was in agreement.  I discussed that patient may decline and die while in the hospital prior to residential hospice placement, she expressed understanding.  Will place a TOC consult.  Harlene Bowl DO

## 2025-02-01 NOTE — H&P (Addendum)
 " History and Physical    Patient: Zachary [name] FMW:[id number] DOB: [birth year] DOA: 02/01/2025 DOS: the patient was seen and examined on 02/01/2025 PCP: Shona Norleen PEDLAR, MD  Patient coming from: SNF  Chief Complaint:  Chief Complaint  Patient presents with   Shortness of Breath   HPI: Zachary [name] is a 79 y.o. male with medical history significant of HTN, HLD, CKD 3b, bladder cancer s/p multiple surgeries and TURBT (12/2022), low-grade papillary urethral carcinoma, urinary retention requiring intermittent catheterizations 3-4x/ day, pre-DM, OSA, chronic pain, nephrolithiasis, anxiety, and depression who presents to the emergency department via EMS from SNF due to shortness of breath.  Nursing staff noted patient with crackles and noisy breathing with some gurgling, he was tachypneic and hypoxic.  Aspiration due to possibility of getting liquid too thin was suspected and EMS was activated.  ED course In the emergency department, he was tachycardic and tachypneic, BP was 112/56, O2 sat was 86% on NRB, this was transitioned to HFNC at FiO2 of 100% with improved O2 sat to 92-95%.  Workup in the ED showed leukocytosis, normocytic anemia and thrombocytosis.  BMP was significant for potassium of 5.6, bicarb 18, blood glucose 121, BUN 115, creatinine 3.17 (baseline creatinine at 2.1-2.3). Chest x-ray showed dense left lower lobe consolidation with small left pleural effusion. Patient was treated with IV ceftriaxone  and azithromycin .  Morphine  was given, Zofran  was provided.  1L of IV fluid was given.  TRH was asked to admit patient   Review of Systems: As mentioned in the history of present illness. All other systems reviewed and are negative. Past Medical History:  Diagnosis Date   Anxiety    takes Valium  as needed   Arthritis    Bladder cancer (HCC)    takes Rapaflo daily   Blood dyscrasia    07/08/16: pt told he was a free bleeder after bleeding a lot when derm cut off mole on forehead. No  excessive bleeding with minor wounds at home, no bleeding problems perioperatively with prior surgeries.   Chronic back pain    spondylolisthesis   Cluster headaches    Depression    takes Lexapro  daily   Essential hypertension, benign    takes Diovan -HCT daily   Hearing loss    hearing aids   History of kidney stones    Hx of complications due to general anesthesia    Aborted surgery 07/15/2016 due to hypotension per pt   Intertrochanteric fracture of left femur (HCC) 02/09/2023   Joint pain    Kidney stone 03/2019   passed stone   Obstructive sleep apnea on CPAP    uses CPAP @ night   Parkinsonism (HCC)    Pneumonia    several times--last time about 3-4 yrs ago   Pre-diabetes    Prediabetes    Syncopal episodes    Thyroid  condition    Past Surgical History:  Procedure Laterality Date   ANTERIOR CERVICAL DECOMP/DISCECTOMY FUSION N/A 06/05/2013   Procedure:  C3-4 Anterior Cervical Discectomy and Fusion, Allograft, Plate;  Surgeon: Oneil JAYSON Herald, MD;  Location: [facility] OR;  Service: Orthopedics;  Laterality: N/A;  C3-4 Anterior Cervical Discectomy and Fusion, Allograft, Plate   Arthroscopic knee surgery     BACK SURGERY      x 2   BLADDER SURGERY     x 5 to remove tumor   CARPAL TUNNEL RELEASE     CATARACT EXTRACTION W/PHACO  12/19/2012   Procedure: CATARACT EXTRACTION PHACO AND INTRAOCULAR LENS  PLACEMENT (IOC);  Surgeon: Cherene Mania, MD;  Location: [facility];  Service: Ophthalmology;  Laterality: Left;  CDE: 26.80   CATARACT EXTRACTION W/PHACO  01/09/2013   Procedure: CATARACT EXTRACTION PHACO AND INTRAOCULAR LENS PLACEMENT (IOC);  Surgeon: Cherene Mania, MD;  Location: [facility];  Service: Ophthalmology;  Laterality: Right;  CDE:22.83   CERVICAL FUSION  06/05/2013   C 3  C4   CYSTOSCOPY     CYSTOSCOPY W/ URETERAL STENT PLACEMENT Bilateral 02/12/2020   Procedure: CYSTOSCOPY WITH RETROGRADE PYELOGRAM/URETERAL STENT PLACEMENT;  Surgeon: Watt Rush, MD;  Location: [facility];  Service: Urology;   Laterality: Bilateral;   CYSTOSCOPY W/ URETERAL STENT PLACEMENT Right 12/25/2024   Procedure: CYSTOSCOPY, WITH RETROGRADE PYELOGRAM AND URETERAL STENT INSERTION;  Surgeon: Watt Rush, MD;  Location: [facility] OR;  Service: Urology;  Laterality: Right;   INTRAMEDULLARY (IM) NAIL INTERTROCHANTERIC Left 02/10/2023   Procedure: LEFT INTRAMEDULLARY (IM) NAILING OF LEFT FEMUR;  Surgeon: Kendal Franky SQUIBB, MD;  Location: [facility] OR;  Service: Orthopedics;  Laterality: Left;   LAMINECTOMY WITH POSTERIOR LATERAL ARTHRODESIS LEVEL 4 N/A 12/13/2024   Procedure: Thoracic Seven-Thoracic Eight - Thoracic Eight-Thoracic Nine - Thoracic Nine-Thoracic Ten, Thoracic Ten-Thoracic Eleven pedicle screw fixation and posterolateral arthrodesis decompression of Thoracic Eight;  Surgeon: Onetha Kuba, MD;  Location: [facility] OR;  Service: Neurosurgery;  Laterality: N/A;  T7-T8 - T8-T9 - T9-T10, T10-11 pedicle screw fixation and posterolateral arthrodesis  decompress   LITHOTRIPSY  02/2020   REFRACTIVE SURGERY     Hx: of   RETINAL DETACHMENT SURGERY     TOTAL KNEE ARTHROPLASTY Left 02/01/2018   Procedure: TOTAL KNEE ARTHROPLASTY;  Surgeon: Margrette Taft BRAVO, MD;  Location: [facility];  Service: Orthopedics;  Laterality: Left;   TRANSURETHRAL RESECTION OF BLADDER TUMOR Right 07/27/2015   Procedure: TRANSURETHRAL RESECTION OF BLADDER TUMOR (TURBT);  Surgeon: Arlena Gal, MD;  Location: [facility];  Service: Urology;  Laterality: Right;   TRANSURETHRAL RESECTION OF BLADDER TUMOR WITH MITOMYCIN -C N/A 01/25/2023   Procedure: TRANSURETHRAL RESECTION OF BLADDER TUMOR WITH GEMCITABINE ;  Surgeon: Carolee Sherwood JONETTA DOUGLAS, MD;  Location: [facility];  Service: Urology;  Laterality: N/A;   wisdom tooth extraction     Social History:  reports that he quit smoking about 48 years ago. His smoking use included cigarettes and cigars. He started smoking about 49 years ago. He has a 0.3 pack-year smoking history. He has been exposed to tobacco smoke. He has never used  smokeless tobacco. He reports that he does not drink alcohol  and does not use drugs.  Allergies[1]  Family History  Problem Relation Age of Onset   Hypertension Father    Cancer Father        Prostate   Depression Mother    Hypertension Mother    Alcohol  abuse Mother    COPD Mother        passed 07-08-17   Hypertension Sister    Anxiety disorder Sister    Alcohol  abuse Maternal Grandfather    ADD / ADHD Neg Hx    Bipolar disorder Neg Hx    Dementia Neg Hx    Drug abuse Neg Hx    OCD Neg Hx    Paranoid behavior Neg Hx    Schizophrenia Neg Hx    Seizures Neg Hx    Sexual abuse Neg Hx    Physical abuse Neg Hx     Prior to Admission medications  Medication Sig Start Date End Date Taking? Authorizing Provider  acetaminophen  (TYLENOL ) 650 MG CR tablet  Take 650 mg by mouth every 6 (six) hours.    [provider]  carvedilol  (COREG ) 12.5 MG tablet Take 1 tablet (12.5 mg total) by mouth 2 (two) times daily. 01/10/25   Rai, Nydia POUR, MD  dorzolamide -timolol  (COSOPT ) 2-0.5 % ophthalmic solution Place 1 drop into both eyes 2 (two) times daily. 01/02/23   [provider]  DULoxetine  (CYMBALTA ) 60 MG capsule Take 1 capsule (60 mg total) by mouth 2 (two) times daily. 11/15/24   Okey Barnie SAUNDERS, MD  latanoprost  (XALATAN ) 0.005 % ophthalmic solution Place 1 drop into both eyes at bedtime.    [provider]  lidocaine  (LIDODERM ) 5 % Place 1 patch onto the skin daily. Apply to back. Apply to intact skin to cover the most painful area. 01/10/25   Rai, Nydia POUR, MD  magic mouthwash w/lidocaine  SOLN Take 10 mLs by mouth 3 (three) times daily as needed for mouth pain. Suspension contains equal amounts of Maalox Extra Strength, nystatin , diphenhydramine  and lidocaine . 01/10/25   Rai, Ripudeep K, MD  melatonin 5 MG TABS Take 5 mg by mouth at bedtime.    [provider]  methocarbamol  (ROBAXIN ) 500 MG tablet Take 500 mg by mouth every 6 (six) hours.    [provider]  mirtazapine  (REMERON ) 7.5 MG tablet Take 1 tablet (7.5 mg total) by mouth at bedtime. 01/10/25   Rai, Nydia POUR, MD  Multiple Vitamin (MULTIVITAMIN) tablet Take 1 tablet by mouth daily.    [provider]  Multiple Vitamins-Minerals (PRESERVISION AREDS PO) Take 1 capsule by mouth in the morning and at bedtime.    [provider]  Omega-3 Fatty Acids (FISH OIL) 1000 MG CAPS Take 1 capsule by mouth daily.    [provider]  polyethylene glycol (MIRALAX  / GLYCOLAX ) 17 g packet Take 17 g by mouth daily. 01/11/25   Rai, Nydia POUR, MD  pravastatin  (PRAVACHOL ) 10 MG tablet Take 10 mg by mouth at bedtime. 01/23/20   [provider]  Protein (PROSOURCE PO) Take 30 mLs by mouth 3 (three) times daily.    [provider]  risperiDONE  (RISPERDAL ) 0.5 MG tablet Take 1 tablet (0.5 mg total) by mouth at bedtime. 01/10/25   Davia Nydia POUR, MD    Physical Exam: Vitals:   02/01/25 0430 02/01/25 0445 02/01/25 0500 02/01/25 0501  BP: (!) 103/51 (!) 86/57 (!) 86/57   Pulse: 97 98 96 90  Resp: (!) 31 (!) 32 (!) 31 (!) 27  SpO2: (!) 85% 93% 93% 92%  Weight:      Height:       General: Elderly male. Awake and alert and oriented x3. Not in any acute distress.  HEENT: NCAT.  PERRLA. EOMI. Sclerae anicteric.  Dry mucosal membranes. Neck: Neck supple without lymphadenopathy. No carotid bruits. No masses palpated.  Cardiovascular: Tachycardia.  Regular rate with normal S1-S2 sounds. No murmurs, rubs or gallops auscultated. No JVD.  Respiratory: Tachypnea.  Diffuse rhonchi on auscultation. No accessory muscle use. Abdomen: Soft, nontender, nondistended. Active bowel sounds. No masses or hepatosplenomegaly  Skin: No rashes, lesions, or ulcerations.  Dry, warm to touch. Musculoskeletal:  2+ dorsalis pedis and radial pulses. Good ROM.  No contractures  Psychiatric: Mood appropriate to current condition. Neurologic: No focal neurological deficits. Strength is  5/5 x 4.   Assessment and Plan: Sepsis due to presumed aspiration pneumonia She met sepsis criteria due to having leukocytosis and being tachycardic, tachypneic (met SIRS criteria) and source of infection being  aspiration pneumonia. PORT/PSI of 208 points  indicating _27-29.2% mortality Patient was started on ceftriaxone  and azithromycin , we shall continue Unasyn  and doxycycline  at this time with plan to de-escalate/discontinue based on blood culture, sputum culture, urine Legionella, strep pneumo  IV hydration per sepsis protocol will be provided Patient will be placed n.p.o. Speech therapist will be consulted and will follow-up with further recommendations Continue Tylenol  as needed Continue Mucinex , incentive spirometry, flutter valve   Acute metabolic encephalopathy This is possibly due to underlying infection Continue fall precaution/delirium precaution Continue management as described above  Elevated troponin possibly due to type II demand ischemia Troponin 52 > 48.  Patient denied chest pain  Hyperkalemia K+ 5.6, recheck potassium level after IV hydration and treat accordingly  Acute kidney injury superimposed on CKD stage IV Creatinine 3.17 (baseline creatinine at 2.1-2.3) Continue IV hydration Renally adjust medications, avoid nephrotoxic agents/dehydration/hypotension  Transaminasemia Possibly due to shock liver Continue to monitor liver enzymes  Thrombocytosis possibly reactive Platelets 535, continue to monitor  Urinary retention with chronic Foley catheter History of bladder cancer requiring previous TURBT Continue Foley catheter, this will be replaced replaced during this admission  Essential hypertension BP meds will be held at this time due to soft BP  Mixed hyperlipidemia Statin will be held at this time due to aspiration risk   Advance Care Planning: DNR  Consults: None  Family Communication: Wife at bedside  Severity of Illness: The appropriate  patient status for this patient is INPATIENT. Inpatient status is judged to be reasonable and necessary in order to provide the required intensity of service to ensure the patient's safety. The patient's presenting symptoms, physical exam findings, and initial radiographic and laboratory data in the context of their chronic comorbidities is felt to place them at high risk for further clinical deterioration. Furthermore, it is not anticipated that the patient will be medically stable for discharge from the hospital within 2 midnights of admission.   * I certify that at the point of admission it is my clinical judgment that the patient will require inpatient hospital care spanning beyond 2 midnights from the point of admission due to high intensity of service, high risk for further deterioration and high frequency of surveillance required.*  Author: Keylor Rands, DO 02/01/2025 6:13 AM  For on call review [email or web address].      [1]  Allergies Allergen Reactions   Poison Oak Extract Hives and Itching   Sulfa Antibiotics Other (See Comments)    Unknown    Sulfonamide Derivatives Itching and Rash   "

## 2025-02-01 NOTE — Consult Note (Signed)
 Clinical/Bedside Swallow Evaluation Patient Details  Name: Zachary [name] MRN: [id number] Date of Birth: [birth year]  Today's Date: 02/01/2025 Time: SLP Start Time (ACUTE ONLY): 1155 SLP Stop Time (ACUTE ONLY): 1214 SLP Time Calculation (min) (ACUTE ONLY): 19 min  Past Medical History:  Past Medical History:  Diagnosis Date   Anxiety    takes Valium  as needed   Arthritis    Bladder cancer (HCC)    takes Rapaflo daily   Blood dyscrasia    07/08/16: pt told he was a free bleeder after bleeding a lot when derm cut off mole on forehead. No excessive bleeding with minor wounds at home, no bleeding problems perioperatively with prior surgeries.   Chronic back pain    spondylolisthesis   Cluster headaches    Depression    takes Lexapro  daily   Essential hypertension, benign    takes Diovan -HCT daily   Hearing loss    hearing aids   History of kidney stones    Hx of complications due to general anesthesia    Aborted surgery 07/15/2016 due to hypotension per pt   Intertrochanteric fracture of left femur (HCC) 02/09/2023   Joint pain    Kidney stone 03/2019   passed stone   Obstructive sleep apnea on CPAP    uses CPAP @ night   Parkinsonism (HCC)    Pneumonia    several times--last time about 3-4 yrs ago   Pre-diabetes    Prediabetes    Syncopal episodes    Thyroid  condition    Past Surgical History:  Past Surgical History:  Procedure Laterality Date   ANTERIOR CERVICAL DECOMP/DISCECTOMY FUSION N/A 06/05/2013   Procedure:  C3-4 Anterior Cervical Discectomy and Fusion, Allograft, Plate;  Surgeon: Oneil JAYSON Herald, MD;  Location: [facility] OR;  Service: Orthopedics;  Laterality: N/A;  C3-4 Anterior Cervical Discectomy and Fusion, Allograft, Plate   Arthroscopic knee surgery     BACK SURGERY      x 2   BLADDER SURGERY     x 5 to remove tumor   CARPAL TUNNEL RELEASE     CATARACT EXTRACTION W/PHACO  12/19/2012   Procedure: CATARACT EXTRACTION PHACO AND INTRAOCULAR LENS PLACEMENT (IOC);   Surgeon: Cherene Mania, MD;  Location: [facility];  Service: Ophthalmology;  Laterality: Left;  CDE: 26.80   CATARACT EXTRACTION W/PHACO  01/09/2013   Procedure: CATARACT EXTRACTION PHACO AND INTRAOCULAR LENS PLACEMENT (IOC);  Surgeon: Cherene Mania, MD;  Location: [facility];  Service: Ophthalmology;  Laterality: Right;  CDE:22.83   CERVICAL FUSION  06/05/2013   C 3  C4   CYSTOSCOPY     CYSTOSCOPY W/ URETERAL STENT PLACEMENT Bilateral 02/12/2020   Procedure: CYSTOSCOPY WITH RETROGRADE PYELOGRAM/URETERAL STENT PLACEMENT;  Surgeon: Watt Rush, MD;  Location: [facility];  Service: Urology;  Laterality: Bilateral;   CYSTOSCOPY W/ URETERAL STENT PLACEMENT Right 12/25/2024   Procedure: CYSTOSCOPY, WITH RETROGRADE PYELOGRAM AND URETERAL STENT INSERTION;  Surgeon: Watt Rush, MD;  Location: [facility] OR;  Service: Urology;  Laterality: Right;   INTRAMEDULLARY (IM) NAIL INTERTROCHANTERIC Left 02/10/2023   Procedure: LEFT INTRAMEDULLARY (IM) NAILING OF LEFT FEMUR;  Surgeon: Kendal Franky SQUIBB, MD;  Location: [facility] OR;  Service: Orthopedics;  Laterality: Left;   LAMINECTOMY WITH POSTERIOR LATERAL ARTHRODESIS LEVEL 4 N/A 12/13/2024   Procedure: Thoracic Seven-Thoracic Eight - Thoracic Eight-Thoracic Nine - Thoracic Nine-Thoracic Ten, Thoracic Ten-Thoracic Eleven pedicle screw fixation and posterolateral arthrodesis decompression of Thoracic Eight;  Surgeon: Onetha Kuba, MD;  Location: [facility] OR;  Service: Neurosurgery;  Laterality:  N/A;  T7-T8 - T8-T9 - T9-T10, T10-11 pedicle screw fixation and posterolateral arthrodesis  decompress   LITHOTRIPSY  02/2020   REFRACTIVE SURGERY     Hx: of   RETINAL DETACHMENT SURGERY     TOTAL KNEE ARTHROPLASTY Left 02/01/2018   Procedure: TOTAL KNEE ARTHROPLASTY;  Surgeon: Margrette Taft BRAVO, MD;  Location: [facility];  Service: Orthopedics;  Laterality: Left;   TRANSURETHRAL RESECTION OF BLADDER TUMOR Right 07/27/2015   Procedure: TRANSURETHRAL RESECTION OF BLADDER TUMOR (TURBT);  Surgeon: Arlena Gal, MD;   Location: [facility];  Service: Urology;  Laterality: Right;   TRANSURETHRAL RESECTION OF BLADDER TUMOR WITH MITOMYCIN -C N/A 01/25/2023   Procedure: TRANSURETHRAL RESECTION OF BLADDER TUMOR WITH GEMCITABINE ;  Surgeon: Carolee Sherwood JONETTA DOUGLAS, MD;  Location: [facility];  Service: Urology;  Laterality: N/A;   wisdom tooth extraction     HPI:  Zachary [name] is a 79 y.o. male with medical history significant of HTN, HLD, CKD 3b, bladder cancer s/p multiple surgeries and TURBT (12/2022), low-grade papillary urethral carcinoma, urinary retention requiring intermittent catheterizations 3-4x/ day, pre-DM, OSA, chronic pain, nephrolithiasis, anxiety, and depression who presents to the emergency department via EMS from SNF due to shortness of breath.   Nursing staff noted patient with crackles and noisy breathing with some gurgling, he was tachypneic and hypoxic.  Aspiration due to possibility of getting liquid too thin was suspected and EMS was activated.  CXR indicated Dense left lower lobe consolidation with small left pleural effusion. Stable  overall aeration.  Pt remained NPO and ST consulted for clinical swallow evaluation.    Assessment / Plan / Recommendation  Clinical Impression  Recommend initiate Dysphagia 3(chopped)/thin liquids via small volume (either tsp or small guided sips, straws allowed if pt supervised for safety); medications crushed with puree; ST will f/u for diet tolerance/dysphagia tx during acute stay. Zachary [name] was seen for a clinical swallow evaluation with slow, prolonged mastication and delayed initiation of the swallow, but no overt s/s of aspiration observed throughout trials.  Pt provided min verbal/visual cues for swallow precautions including small sips/bites, slow rate and sustaining attention for trials/eliminating environmental distractions.  Cumulative swallow suggestive of cognitive-based dysphagia with symptoms described above.  No family available to determine prior level of function, but  chart review revealed recent admission in December of 2025 with BSE indicating initial need for nectar-thickened liquids/modified diet d/t deconditioning/sx, but pt eventually progressed to Regular/thin liquids with FULL supervision provided for safety/A with meals.  ST will f/u in acute setting for dysphagia tx/management.  Thank you for this consult. SLP Visit Diagnosis: Dysphagia, oropharyngeal phase (R13.12)    Aspiration Risk  Mild aspiration risk    Diet Recommendation   Thin;Dysphagia 3 (mechanical soft)  Medication Administration: Crushed with puree    Other Recommendations Oral Care Recommendations: Oral care BID;Staff/trained caregiver to provide oral care     Swallow Evaluation Recommendations  Dysphagia 2(chopped)/thin liquids with FULL A with meals/supervision during all po intake; medications crushed with puree for safety   Assistance Recommended at Discharge  TBD  Functional Status Assessment Patient has had a recent decline in their functional status and demonstrates the ability to make significant improvements in function in a reasonable and predictable amount of time.  Frequency and Duration min 2x/week  1 week       Prognosis Prognosis for improved oropharyngeal function: Good Barriers to Reach Goals: Cognitive deficits      Swallow Study   General Date of Onset: 02/01/25 HPI:  Zachary [name] is a 79 y.o. male with medical history significant of HTN, HLD, CKD 3b, bladder cancer s/p multiple surgeries and TURBT (12/2022), low-grade papillary urethral carcinoma, urinary retention requiring intermittent catheterizations 3-4x/ day, pre-DM, OSA, chronic pain, nephrolithiasis, anxiety, and depression who presents to the emergency department via EMS from SNF due to shortness of breath.   Nursing staff noted patient with crackles and noisy breathing with some gurgling, he was tachypneic and hypoxic.  Aspiration due to possibility of getting liquid too thin was suspected and EMS  was activated.  CXR indicated Dense left lower lobe consolidation with small left pleural effusion. Stable  overall aeration.  Pt remained NPO and ST consulted for clinical swallow evaluation. Type of Study: Bedside Swallow Evaluation Previous Swallow Assessment: 12/26/24 recommending Dysphagia 2/nectar-thickened liquids, but pt progressed to Regular/thin prior to d/c. Diet Prior to this Study: NPO Temperature Spikes Noted: No Respiratory Status: Other (comment) (HFNC) History of Recent Intubation: No Behavior/Cognition: Alert;Confused Oral Cavity Assessment: Dry Oral Care Completed by SLP: Recent completion by staff Oral Cavity - Dentition: Dentures, top;Dentures, bottom Self-Feeding Abilities: Needs assist Patient Positioning: Upright in bed;Other (comment) (as high as tolerated;45 degrees) Baseline Vocal Quality: Low vocal intensity Volitional Cough: Weak Volitional Swallow: Able to elicit    Oral/Motor/Sensory Function Overall Oral Motor/Sensory Function: Generalized oral weakness   Ice Chips Ice chips: Impaired Presentation: Spoon Oral Phase Impairments: Impaired mastication;Reduced lingual movement/coordination Oral Phase Functional Implications: Prolonged oral transit Pharyngeal Phase Impairments: Suspected delayed Swallow   Thin Liquid Thin Liquid: Impaired Presentation: Spoon;Cup;Straw Oral Phase Functional Implications: Oral holding Pharyngeal  Phase Impairments: Suspected delayed Swallow    Nectar Thick Nectar Thick Liquid: Not tested   Honey Thick Honey Thick Liquid: Not tested   Puree Puree: Impaired Presentation: Spoon Oral Phase Functional Implications: Prolonged oral transit Pharyngeal Phase Impairments: Suspected delayed Swallow   Solid     Solid: Impaired Presentation: Spoon Oral Phase Impairments: Impaired mastication Oral Phase Functional Implications: Impaired mastication;Prolonged oral transit Pharyngeal Phase Impairments: Suspected delayed Swallow       Pat Lubertha Leite,M.S.,CCC-SLP 02/01/2025,1:33 PM

## 2025-02-01 NOTE — ED Notes (Signed)
 Respiratory called due pt's sats 88-90%

## 2025-02-01 NOTE — ED Notes (Signed)
 Secretions heard while assessing pt. Pt was repositioned to help.

## 2025-02-01 NOTE — ED Provider Notes (Signed)
 " [facility] Emergency Department Provider Note MRN:  [id number]  Arrival date & time: 02/01/25     Chief Complaint   Shortness of Breath   History of Present Illness   Zachary [name] is a 79 y.o. year-old male with a history of bladder cancer, dementia presenting to the ED with chief complaint of shortness of breath.  EMS called for some respiratory distress at the care facility.  Staff heard gurgling and crackles and noisy breathing, patient found to be tachypneic and hypoxic.  Might be getting liquid that is too thin, facility concerned about aspiration.  Review of Systems  A thorough review of systems was obtained and all systems are negative except as noted in the HPI and PMH.   Patient's Health History    Past Medical History:  Diagnosis Date   Anxiety    takes Valium  as needed   Arthritis    Bladder cancer (HCC)    takes Rapaflo daily   Blood dyscrasia    07/08/16: pt told he was a free bleeder after bleeding a lot when derm cut off mole on forehead. No excessive bleeding with minor wounds at home, no bleeding problems perioperatively with prior surgeries.   Chronic back pain    spondylolisthesis   Cluster headaches    Depression    takes Lexapro  daily   Essential hypertension, benign    takes Diovan -HCT daily   Hearing loss    hearing aids   History of kidney stones    Hx of complications due to general anesthesia    Aborted surgery 07/15/2016 due to hypotension per pt   Intertrochanteric fracture of left femur (HCC) 02/09/2023   Joint pain    Kidney stone 03/2019   passed stone   Obstructive sleep apnea on CPAP    uses CPAP @ night   Parkinsonism (HCC)    Pneumonia    several times--last time about 3-4 yrs ago   Pre-diabetes    Prediabetes    Syncopal episodes    Thyroid  condition     Past Surgical History:  Procedure Laterality Date   ANTERIOR CERVICAL DECOMP/DISCECTOMY FUSION N/A 06/05/2013   Procedure:  C3-4 Anterior  Cervical Discectomy and Fusion, Allograft, Plate;  Surgeon: Oneil JAYSON Herald, MD;  Location: [facility] OR;  Service: Orthopedics;  Laterality: N/A;  C3-4 Anterior Cervical Discectomy and Fusion, Allograft, Plate   Arthroscopic knee surgery     BACK SURGERY      x 2   BLADDER SURGERY     x 5 to remove tumor   CARPAL TUNNEL RELEASE     CATARACT EXTRACTION W/PHACO  12/19/2012   Procedure: CATARACT EXTRACTION PHACO AND INTRAOCULAR LENS PLACEMENT (IOC);  Surgeon: Cherene Mania, MD;  Location: [facility];  Service: Ophthalmology;  Laterality: Left;  CDE: 26.80   CATARACT EXTRACTION W/PHACO  01/09/2013   Procedure: CATARACT EXTRACTION PHACO AND INTRAOCULAR LENS PLACEMENT (IOC);  Surgeon: Cherene Mania, MD;  Location: [facility];  Service: Ophthalmology;  Laterality: Right;  CDE:22.83   CERVICAL FUSION  06/05/2013   C 3  C4   CYSTOSCOPY     CYSTOSCOPY W/ URETERAL STENT PLACEMENT Bilateral 02/12/2020   Procedure: CYSTOSCOPY WITH RETROGRADE PYELOGRAM/URETERAL STENT PLACEMENT;  Surgeon: Watt Rush, MD;  Location: [facility];  Service: Urology;  Laterality: Bilateral;   CYSTOSCOPY W/ URETERAL STENT PLACEMENT Right 12/25/2024   Procedure: CYSTOSCOPY, WITH RETROGRADE PYELOGRAM AND URETERAL STENT INSERTION;  Surgeon: Watt Rush, MD;  Location: [facility] OR;  Service: Urology;  Laterality: Right;   INTRAMEDULLARY (IM) NAIL INTERTROCHANTERIC Left 02/10/2023   Procedure: LEFT INTRAMEDULLARY (IM) NAILING OF LEFT FEMUR;  Surgeon: Kendal Franky SQUIBB, MD;  Location: [facility] OR;  Service: Orthopedics;  Laterality: Left;   LAMINECTOMY WITH POSTERIOR LATERAL ARTHRODESIS LEVEL 4 N/A 12/13/2024   Procedure: Thoracic Seven-Thoracic Eight - Thoracic Eight-Thoracic Nine - Thoracic Nine-Thoracic Ten, Thoracic Ten-Thoracic Eleven pedicle screw fixation and posterolateral arthrodesis decompression of Thoracic Eight;  Surgeon: Onetha Kuba, MD;  Location: [facility] OR;  Service: Neurosurgery;  Laterality: N/A;  T7-T8 - T8-T9 - T9-T10, T10-11 pedicle screw fixation and posterolateral  arthrodesis  decompress   LITHOTRIPSY  02/2020   REFRACTIVE SURGERY     Hx: of   RETINAL DETACHMENT SURGERY     TOTAL KNEE ARTHROPLASTY Left 02/01/2018   Procedure: TOTAL KNEE ARTHROPLASTY;  Surgeon: Margrette Taft BRAVO, MD;  Location: [facility];  Service: Orthopedics;  Laterality: Left;   TRANSURETHRAL RESECTION OF BLADDER TUMOR Right 07/27/2015   Procedure: TRANSURETHRAL RESECTION OF BLADDER TUMOR (TURBT);  Surgeon: Arlena Gal, MD;  Location: [facility];  Service: Urology;  Laterality: Right;   TRANSURETHRAL RESECTION OF BLADDER TUMOR WITH MITOMYCIN -C N/A 01/25/2023   Procedure: TRANSURETHRAL RESECTION OF BLADDER TUMOR WITH GEMCITABINE ;  Surgeon: Carolee Sherwood JONETTA DOUGLAS, MD;  Location: [facility];  Service: Urology;  Laterality: N/A;   wisdom tooth extraction      Family History  Problem Relation Age of Onset   Hypertension Father    Cancer Father        Prostate   Depression Mother    Hypertension Mother    Alcohol  abuse Mother    COPD Mother        passed 07-08-17   Hypertension Sister    Anxiety disorder Sister    Alcohol  abuse Maternal Grandfather    ADD / ADHD Neg Hx    Bipolar disorder Neg Hx    Dementia Neg Hx    Drug abuse Neg Hx    OCD Neg Hx    Paranoid behavior Neg Hx    Schizophrenia Neg Hx    Seizures Neg Hx    Sexual abuse Neg Hx    Physical abuse Neg Hx     Social History   Socioeconomic History   Marital status: Married    Spouse name: Garment/textile Technologist    Number of children: 0   Years of education: 13   Highest education level: Not on file  Occupational History   Occupation: Retired    Comment: med [employer]    Employer: UNEMPLOYED  Tobacco Use   Smoking status: Former    Current packs/day: 0.00    Average packs/day: 0.3 packs/day for 1 year (0.3 ttl pk-yrs)    Types: Cigarettes, Cigars    Start date: 12/31/1975    Quit date: 12/30/1976    Years since quitting: 48.1    Passive exposure: Past   Smokeless tobacco: Never   Tobacco comments:    1985  Vaping  Use   Vaping status: Never Used  Substance and Sexual Activity   Alcohol  use: No    Comment: quit 1990   Drug use: No   Sexual activity: Yes  Other Topics Concern   Not on file  Social History Narrative   Lives with wife    caffeine- sodas   Right handed   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    Social Drivers of Health   Tobacco Use: Medium Risk (02/01/2025)   Patient History  Smoking Tobacco Use: Former    Smokeless Tobacco Use: Never    Passive Exposure: Past  Programmer, Applications: Not on file  Food Insecurity: No Food Insecurity (12/29/2024)   Epic    Worried About Programme Researcher, Broadcasting/film/video in the Last Year: Never true    Ran Out of Food in the Last Year: Never true  Transportation Needs: No Transportation Needs (12/29/2024)   Epic    Lack of Transportation (Medical): No    Lack of Transportation (Non-Medical): No  Physical Activity: Not on file  Stress: Not on file  Social Connections: Moderately Isolated (12/29/2024)   Social Connection and Isolation Panel    Frequency of Communication with Friends and Family: More than three times a week    Frequency of Social Gatherings with Friends and Family: Never    Attends Religious Services: Never    Database Administrator or Organizations: No    Attends Banker Meetings: Never    Marital Status: Married  Catering Manager Violence: Not At Risk (12/29/2024)   Epic    Fear of Current or Ex-Partner: No    Emotionally Abused: No    Physically Abused: No    Sexually Abused: No  Depression (PHQ2-9): High Risk (07/26/2023)   Depression (PHQ2-9)    PHQ-2 Score: 18  Alcohol  Screen: Not on file  Housing: Low Risk (12/29/2024)   Epic    Unable to Pay for Housing in the Last Year: No    Number of Times Moved in the Last Year: 0    Homeless in the Last Year: No  Utilities: Not At Risk (12/29/2024)   Epic    Threatened with loss of utilities: No  Health Literacy: Not on file     Physical Exam   Vitals:   02/01/25 0500  02/01/25 0501  BP: (!) 86/57   Pulse: 96 90  Resp: (!) 31 (!) 27  SpO2: 93% 92%    CONSTITUTIONAL: Chronically ill-appearing NEURO/PSYCH: Mildly somnolent, wakes to voice, oriented to name, answer some questions EYES:  eyes equal and reactive ENT/NECK:  no LAD, no JVD CARDIO: Tachycardic rate, well-perfused, normal S1 and S2 PULM: Mild tachypnea, diffuse rhonchi GI/GU:  non-distended, non-tender MSK/SPINE:  No gross deformities, no edema SKIN:  no rash, atraumatic   *Additional and/or pertinent findings included in MDM below  Diagnostic and Interventional Summary    EKG Interpretation Date/Time:  Thursday February 01 2025 04:51:24 EST Ventricular Rate:  106 PR Interval:  116 QRS Duration:  154 QT Interval:  335 QTC Calculation: 445 R Axis:   21  Text Interpretation: Sinus tachycardia with irregular rate Right bundle branch block Inferior infarct, old Confirmed by Theadore Sharper [phone]) on 02/01/2025 5:10:09 AM       Labs Reviewed  COMPREHENSIVE METABOLIC PANEL WITH GFR - Abnormal; Notable for the following components:      Result Value   Potassium 5.6 (*)    CO2 18 (*)    Glucose, Bld 121 (*)    BUN 115 (*)    Creatinine, Ser 3.17 (*)    Total Protein 8.9 (*)    Albumin  3.3 (*)    AST 46 (*)    ALT 64 (*)    Alkaline Phosphatase 211 (*)    GFR, Estimated 19 (*)    Anion gap 17 (*)    All other components within normal limits  CBC WITH DIFFERENTIAL/PLATELET - Abnormal; Notable for the following components:   WBC 28.0 (*)  RBC 4.05 (*)    Hemoglobin 11.5 (*)    HCT 37.2 (*)    RDW 16.8 (*)    Platelets 535 (*)    Neutro Abs 25.2 (*)    Abs Immature Granulocytes 0.28 (*)    All other components within normal limits  PROTIME-INR - Abnormal; Notable for the following components:   Prothrombin Time 16.4 (*)    INR 1.3 (*)    All other components within normal limits  BLOOD GAS, VENOUS - Abnormal; Notable for the following components:   pCO2, Ven 38 (*)     pO2, Ven <31 (*)    Bicarbonate 16.7 (*)    Acid-base deficit 9.8 (*)    All other components within normal limits  TROPONIN T, HIGH SENSITIVITY - Abnormal; Notable for the following components:   Troponin T High Sensitivity 52 (*)    All other components within normal limits  RESP PANEL BY RT-PCR (RSV, FLU A&B, COVID)  RVPGX2  CULTURE, BLOOD (ROUTINE X 2)  CULTURE, BLOOD (ROUTINE X 2)  LACTIC ACID, PLASMA  LACTIC ACID, PLASMA  URINALYSIS, W/ REFLEX TO CULTURE (INFECTION SUSPECTED)  TROPONIN T, HIGH SENSITIVITY    DG Chest Port 1 View  Final Result    DG Chest Port 1 View  Final Result      Medications  cefTRIAXone  (ROCEPHIN ) 2 g in sodium chloride  0.9 % 100 mL IVPB (0 g Intravenous Stopped 02/01/25 0400)  azithromycin  (ZITHROMAX ) 500 mg in sodium chloride  0.9 % 250 mL IVPB (500 mg Intravenous New Bag/Given 02/01/25 0411)  sodium chloride  0.9 % bolus 1,000 mL (1,000 mLs Intravenous Bolus 02/01/25 0344)  ondansetron  (ZOFRAN ) injection 4 mg (4 mg Intravenous Given 02/01/25 0424)  morphine  (PF) 4 MG/ML injection 4 mg (4 mg Intravenous Given 02/01/25 0457)     Procedures  /  Critical Care .Critical Care  Performed by: Theadore Ozell HERO, MD Authorized by: Theadore Ozell HERO, MD   Critical care provider statement:    Critical care time (minutes):  80   Critical care was necessary to treat or prevent imminent or life-threatening deterioration of the following conditions:  Respiratory failure   Critical care was time spent personally by me on the following activities:  Development of treatment plan with patient or surrogate, discussions with consultants, evaluation of patient's response to treatment, examination of patient, ordering and review of laboratory studies, ordering and review of radiographic studies, ordering and performing treatments and interventions, pulse oximetry, re-evaluation of patient's condition and review of old charts   ED Course and Medical Decision Making  Initial  Impression and Ddx Differential diagnosis includes aspiration pneumonia, sepsis of pulmonary origin, pneumothorax, less likely PE, ACS  Past medical/surgical history that increases complexity of ED encounter: Dementia, bladder cancer  Interpretation of Diagnostics I personally reviewed the EKG and my interpretation is as follows: Sinus tachycardia  Labs reveal acute renal failure, prominent leukocytosis, troponin elevation  Patient Reassessment and Ultimate Disposition/Management     Wife at bedside able to provide more history and we discussed goals of care at length.  Wife is definitely considering a transition to comfort care.  It is confirmed that patient is DNR/DNI.  She discussed with her family and they would like to continue with antibiotics maybe for just a day or 2 to see if it helps.  Patient continued to exhibit oxygen  saturations in the mid 80s despite nonrebreather at 15 L.  BiPAP was considered but patient began showing signs of nausea vomiting.  Provided with Zofran , started on high flow nasal cannula.  Patient also began exhibiting hypotension.  We had further goals of care discussion, discussed the pros and cons of pressors, central line excetra.  Decision made by patient's wife to not escalate care any further, no pressors, no central line, plan is to continue the course of antibiotics with an understanding that we would need to discuss to transition to comfort care if patient's blood pressure continued to worsen and/or he needed further symptomatic control.  5:45 AM update: Patient's blood pressure seems to be improving, he now seems more comfortable, oxygen  saturation is improved on high flow.  Plan is for hospitalist admission.  Patient management required discussion with the following services or consulting groups:  Hospitalist Service  Complexity of Problems Addressed Acute illness or injury that poses threat of life of bodily function  Additional Data Reviewed and  Analyzed Further history obtained from: Further history from spouse/family member  Additional Factors Impacting ED Encounter Risk Consideration of hospitalization  Ozell HERO. Theadore, MD [facility] Emergency Medicine [facility] [email or web address]  Final Clinical Impressions(s) / ED Diagnoses     ICD-10-CM   1. Acute respiratory failure with hypoxia (HCC)  J96.01     2. Acute renal failure superimposed on chronic kidney disease, unspecified acute renal failure type, unspecified CKD stage  N17.9    N18.9       ED Discharge Orders     None        Discharge Instructions Discussed with and Provided to Patient:   Discharge Instructions   None      Theadore Ozell HERO, MD 02/01/25 [phone]  "

## 2025-02-01 NOTE — Progress Notes (Signed)
" °   02/01/25 2001  Oxygen  Therapy/Pulse Ox  O2 Device HHFNC  SpO2 92 %  O2 Therapy Oxygen  humidified  O2 Flow Rate (L/min) 30 L/min  FiO2 (%) 80 %  Heater temperature 93.4 F (34.1 C)  Sterile water  bottle/bag changed Yes   Patient transported with RN and RT to 332 from ED 19. Transported patient on non rebreather mask then placed patient back on HHFNC once in room 332. No adverse events noted. Patient tolerated trip. "

## 2025-02-01 NOTE — Progress Notes (Signed)
 RT initially called to place patient on BIPAP however patient has been aspirating and was trying to vomit. Discussed with MD and plan to trial HHFNC on patient instead of BIPAP at this time. Patient tolerating well currently on 30L @ 100%.

## 2025-02-02 ENCOUNTER — Inpatient Hospital Stay (HOSPITAL_COMMUNITY): Admission: AD | Admit: 2025-02-02 | Source: Home / Self Care | Admitting: Internal Medicine

## 2025-02-02 DIAGNOSIS — Z515 Encounter for palliative care: Principal | ICD-10-CM

## 2025-02-02 LAB — CULTURE, BLOOD (ROUTINE X 2)
Culture: NO GROWTH
Culture: NO GROWTH
Special Requests: ADEQUATE
Special Requests: ADEQUATE

## 2025-02-02 LAB — URINE CULTURE: Culture: 40000 — AB

## 2025-02-02 MED ORDER — CHLORHEXIDINE GLUCONATE CLOTH 2 % EX PADS
6.0000 | MEDICATED_PAD | Freq: Every day | CUTANEOUS | Status: DC
  Administered 2025-02-02: 6 via TOPICAL

## 2025-02-02 MED ORDER — MORPHINE SULFATE (PF) 2 MG/ML IV SOLN
2.0000 mg | Freq: Four times a day (QID) | INTRAVENOUS | Status: AC
  Administered 2025-02-02: 2 mg via INTRAVENOUS

## 2025-02-02 MED ORDER — LORAZEPAM 2 MG/ML IJ SOLN: 1.0000 mg | INTRAMUSCULAR | Status: AC | PRN

## 2025-02-02 MED ORDER — MORPHINE SULFATE (PF) 2 MG/ML IV SOLN
INTRAVENOUS | Status: AC
  Filled 2025-02-02: qty 1

## 2025-02-02 MED ORDER — HALOPERIDOL 0.5 MG PO TABS: 0.5000 mg | ORAL_TABLET | ORAL | Status: AC | PRN

## 2025-02-02 MED ORDER — MORPHINE SULFATE (CONCENTRATE) 10 MG /0.5 ML PO SOLN: 5.0000 mg | ORAL | Status: AC | PRN

## 2025-02-02 MED ORDER — LORAZEPAM 1 MG PO TABS: 1.0000 mg | ORAL_TABLET | ORAL | Status: AC | PRN

## 2025-02-02 MED ORDER — MORPHINE SULFATE (PF) 2 MG/ML IV SOLN
2.0000 mg | Freq: Four times a day (QID) | INTRAVENOUS | Status: DC
  Administered 2025-02-02: 2 mg via INTRAVENOUS
  Filled 2025-02-02: qty 1

## 2025-02-02 MED ORDER — LORAZEPAM 2 MG/ML PO CONC: 1.0000 mg | ORAL | Status: DC | PRN

## 2025-02-02 MED ORDER — CHLORHEXIDINE GLUCONATE CLOTH 2 % EX PADS: 6.0000 | MEDICATED_PAD | Freq: Every day | CUTANEOUS | Status: AC

## 2025-02-02 MED ORDER — ACETAMINOPHEN 325 MG PO TABS: 650.0000 mg | ORAL_TABLET | Freq: Four times a day (QID) | ORAL | Status: AC | PRN

## 2025-02-02 MED ORDER — ACETAMINOPHEN 650 MG RE SUPP: 650.0000 mg | Freq: Four times a day (QID) | RECTAL | Status: AC | PRN

## 2025-02-02 MED ORDER — GLYCOPYRROLATE 0.2 MG/ML IJ SOLN: 0.2000 mg | INTRAMUSCULAR | Status: AC | PRN

## 2025-02-02 MED ORDER — MORPHINE SULFATE (PF) 2 MG/ML IV SOLN: 1.0000 mg | INTRAVENOUS | Status: AC | PRN

## 2025-02-02 MED ORDER — HALOPERIDOL LACTATE 2 MG/ML PO CONC: 0.5000 mg | ORAL | Status: DC | PRN

## 2025-02-02 MED ORDER — HALOPERIDOL LACTATE 5 MG/ML IJ SOLN: 0.5000 mg | INTRAMUSCULAR | Status: AC | PRN

## 2025-02-02 MED ORDER — ONDANSETRON HCL 4 MG/2ML IJ SOLN: 4.0000 mg | Freq: Four times a day (QID) | INTRAMUSCULAR | Status: AC | PRN

## 2025-02-02 MED ORDER — GLYCOPYRROLATE 1 MG PO TABS: 1.0000 mg | ORAL_TABLET | ORAL | Status: AC | PRN

## 2025-02-02 MED ORDER — ONDANSETRON HCL 4 MG PO TABS: 4.0000 mg | ORAL_TABLET | Freq: Four times a day (QID) | ORAL | Status: AC | PRN

## 2025-02-02 MED ORDER — DM-GUAIFENESIN ER 30-600 MG PO TB12: 1.0000 | ORAL_TABLET | Freq: Two times a day (BID) | ORAL | Status: AC

## 2025-02-02 MED ORDER — POLYVINYL ALCOHOL 1.4 % OP SOLN: 1.0000 [drp] | Freq: Four times a day (QID) | OPHTHALMIC | Status: AC | PRN

## 2025-02-02 NOTE — H&P (Signed)
 " History and Physical    Zachary [name] FMW:[id number] DOB: Jan 31, 1946 DOA: 02/02/2025  I have briefly reviewed the patient's prior medical records in [facility] Link  PCP: Shona [name] PEDLAR, MD      Zachary [name] is a 79 y.o. male with medical history significant of HTN, HLD, CKD 3b, bladder cancer s/p multiple surgeries and TURBT (12/2022), low-grade papillary urethral carcinoma, urinary retention requiring intermittent catheterizations 3-4x/ day, pre-DM, OSA, chronic pain, nephrolithiasis, anxiety, and depression who presents to the emergency department via EMS from SNF due to shortness of breath.  Patient has not done well since discharge to skilled nursing facility.  He is currently aspirating his food/liquids.  Discussion was had with wife and in light of his many medical illnesses and poor response to rehab decision was made to transition to comfort care with hope for residential hospice placement/GIP   Review of Systems: As per HPI otherwise 10 point review of systems negative.   Past Medical History:  Diagnosis Date   Anxiety    takes Valium  as needed   Arthritis    Bladder cancer (HCC)    takes Rapaflo daily   Blood dyscrasia    07/08/16: pt told he was a free bleeder after bleeding a lot when derm cut off mole on forehead. No excessive bleeding with minor wounds at home, no bleeding problems perioperatively with prior surgeries.   Chronic back pain    spondylolisthesis   Cluster headaches    Depression    takes Lexapro  daily   Essential hypertension, benign    takes Diovan -HCT daily   Hearing loss    hearing aids   History of kidney stones    Hx of complications due to general anesthesia    Aborted surgery 07/15/2016 due to hypotension per pt   Intertrochanteric fracture of left femur (HCC) 02/09/2023   Joint pain    Kidney stone 03/2019   passed stone   Obstructive sleep apnea on CPAP    uses CPAP @ night   Parkinsonism (HCC)    Pneumonia    several times--last  time about 3-4 yrs ago   Pre-diabetes    Prediabetes    Syncopal episodes    Thyroid  condition     Past Surgical History:  Procedure Laterality Date   ANTERIOR CERVICAL DECOMP/DISCECTOMY FUSION N/A 06/05/2013   Procedure:  C3-4 Anterior Cervical Discectomy and Fusion, Allograft, Plate;  Surgeon: Oneil JAYSON Herald, MD;  Location: [facility] OR;  Service: Orthopedics;  Laterality: N/A;  C3-4 Anterior Cervical Discectomy and Fusion, Allograft, Plate   Arthroscopic knee surgery     BACK SURGERY      x 2   BLADDER SURGERY     x 5 to remove tumor   CARPAL TUNNEL RELEASE     CATARACT EXTRACTION W/PHACO  12/19/2012   Procedure: CATARACT EXTRACTION PHACO AND INTRAOCULAR LENS PLACEMENT (IOC);  Surgeon: Cherene Mania, MD;  Location: [facility]S;  Service: Ophthalmology;  Laterality: Left;  CDE: 26.80   CATARACT EXTRACTION W/PHACO  01/09/2013   Procedure: CATARACT EXTRACTION PHACO AND INTRAOCULAR LENS PLACEMENT (IOC);  Surgeon: Cherene Mania, MD;  Location: [facility]S;  Service: Ophthalmology;  Laterality: Right;  CDE:22.83   CERVICAL FUSION  06/05/2013   C 3  C4   CYSTOSCOPY     CYSTOSCOPY W/ URETERAL STENT PLACEMENT Bilateral 02/12/2020   Procedure: CYSTOSCOPY WITH RETROGRADE PYELOGRAM/URETERAL STENT PLACEMENT;  Surgeon: Zachary Norleen, MD;  Location: [facility];  Service: Urology;  Laterality: Bilateral;  CYSTOSCOPY W/ URETERAL STENT PLACEMENT Right 12/25/2024   Procedure: CYSTOSCOPY, WITH RETROGRADE PYELOGRAM AND URETERAL STENT INSERTION;  Surgeon: Zachary Rush, MD;  Location: [facility] OR;  Service: Urology;  Laterality: Right;   INTRAMEDULLARY (IM) NAIL INTERTROCHANTERIC Left 02/10/2023   Procedure: LEFT INTRAMEDULLARY (IM) NAILING OF LEFT FEMUR;  Surgeon: Zachary Franky SQUIBB, MD;  Location: [facility] OR;  Service: Orthopedics;  Laterality: Left;   LAMINECTOMY WITH POSTERIOR LATERAL ARTHRODESIS LEVEL 4 N/A 12/13/2024   Procedure: Thoracic Seven-Thoracic Eight - Thoracic Eight-Thoracic Nine - Thoracic Nine-Thoracic Ten, Thoracic Ten-Thoracic Eleven  pedicle screw fixation and posterolateral arthrodesis decompression of Thoracic Eight;  Surgeon: Zachary Kuba, MD;  Location: [facility] OR;  Service: Neurosurgery;  Laterality: N/A;  T7-T8 - T8-T9 - T9-T10, T10-11 pedicle screw fixation and posterolateral arthrodesis  decompress   LITHOTRIPSY  02/2020   REFRACTIVE SURGERY     Hx: of   RETINAL DETACHMENT SURGERY     TOTAL KNEE ARTHROPLASTY Left 02/01/2018   Procedure: TOTAL KNEE ARTHROPLASTY;  Surgeon: Zachary Taft BRAVO, MD;  Location: [facility];  Service: Orthopedics;  Laterality: Left;   TRANSURETHRAL RESECTION OF BLADDER TUMOR Right 07/27/2015   Procedure: TRANSURETHRAL RESECTION OF BLADDER TUMOR (TURBT);  Surgeon: Zachary Gal, MD;  Location: [facility];  Service: Urology;  Laterality: Right;   TRANSURETHRAL RESECTION OF BLADDER TUMOR WITH MITOMYCIN -C N/A 01/25/2023   Procedure: TRANSURETHRAL RESECTION OF BLADDER TUMOR WITH GEMCITABINE ;  Surgeon: Zachary Sherwood JONETTA DOUGLAS, MD;  Location: [facility];  Service: Urology;  Laterality: N/A;   wisdom tooth extraction       reports that he quit smoking about 48 years ago. His smoking use included cigarettes and cigars. He started smoking about 49 years ago. He has a 0.3 pack-year smoking history. He has been exposed to tobacco smoke. He has never used smokeless tobacco. He reports that he does not drink alcohol  and does not use drugs.  Allergies[1]  Family History  Problem Relation Age of Onset   Hypertension Father    Cancer Father        Prostate   Depression Mother    Hypertension Mother    Alcohol  abuse Mother    COPD Mother        passed 07-08-17   Hypertension Sister    Anxiety disorder Sister    Alcohol  abuse Maternal Grandfather    ADD / ADHD Neg Hx    Bipolar disorder Neg Hx    Dementia Neg Hx    Drug abuse Neg Hx    OCD Neg Hx    Paranoid behavior Neg Hx    Schizophrenia Neg Hx    Seizures Neg Hx    Sexual abuse Neg Hx    Physical abuse Neg Hx       Physical Exam: There were no vitals  filed for this visit.   In bed, uncomfortable appearing  Labs on Admission: I have personally reviewed following labs and imaging studies  CBC: Recent Labs  Lab 02/01/25 0326  WBC 28.0*  NEUTROABS 25.2*  HGB 11.5*  HCT 37.2*  MCV 91.9  PLT 535*   Basic Metabolic Panel: Recent Labs  Lab 02/01/25 0326 02/01/25 0518  NA 141  --   K 5.6*  --   CL 106  --   CO2 18*  --   GLUCOSE 121*  --   BUN 115*  --   CREATININE 3.17*  --   CALCIUM 9.8  --   MG  --  2.0  PHOS  --  5.0*   GFR: Estimated Creatinine Clearance: 21.4 mL/min (A) (by C-G formula based on SCr of 3.17 mg/dL (H)). Liver Function Tests: Recent Labs  Lab 02/01/25 0326  AST 46*  ALT 64*  ALKPHOS 211*  BILITOT 0.3  PROT 8.9*  ALBUMIN  3.3*   No results for input(s): LIPASE, AMYLASE in the last 168 hours. No results for input(s): AMMONIA in the last 168 hours. Coagulation Profile: Recent Labs  Lab 02/01/25 0326  INR 1.3*   Cardiac Enzymes: No results for input(s): CKTOTAL, CKMB, CKMBINDEX, TROPONINI in the last 168 hours. BNP (last 3 results) Recent Labs    12/25/24 0922  PROBNP 2,105.0*   HbA1C: No results for input(s): HGBA1C in the last 72 hours. CBG: No results for input(s): GLUCAP in the last 168 hours. Lipid Profile: No results for input(s): CHOL, HDL, LDLCALC, TRIG, CHOLHDL, LDLDIRECT in the last 72 hours. Thyroid  Function Tests: No results for input(s): TSH, T4TOTAL, FREET4, T3FREE, THYROIDAB in the last 72 hours. Anemia Panel: No results for input(s): VITAMINB12, FOLATE, FERRITIN, TIBC, IRON, RETICCTPCT in the last 72 hours. Urine analysis:    Component Value Date/Time   COLORURINE YELLOW 02/01/2025 1001   APPEARANCEUR CLOUDY (A) 02/01/2025 1001   LABSPEC 1.016 02/01/2025 1001   PHURINE 5.0 02/01/2025 1001   GLUCOSEU NEGATIVE 02/01/2025 1001   HGBUR MODERATE (A) 02/01/2025 1001   BILIRUBINUR NEGATIVE 02/01/2025 1001    KETONESUR NEGATIVE 02/01/2025 1001   PROTEINUR 100 (A) 02/01/2025 1001   UROBILINOGEN 0.2 06/01/2013 1516   NITRITE NEGATIVE 02/01/2025 1001   LEUKOCYTESUR LARGE (A) 02/01/2025 1001     Radiological Exams on Admission: DG Chest Port 1 View Result Date: 02/01/2025 EXAM: 1 VIEW XRAY OF THE CHEST 02/01/2025 05:12:00 AM COMPARISON: Portable chest today at 02/01/2025 03:37:00 AM. CLINICAL HISTORY: Repeat chest pain. Follow-up post breathing apparatus. FINDINGS: LINES, TUBES AND DEVICES: Overlying telemetry leads, alligator clip on the left and plastic tubing. LUNGS AND PLEURA: Dense left lower lobe consolidation continues to be seen. Small left pleural effusion. The lungs are otherwise clear. No pneumothorax. HEART AND MEDIASTINUM: Mild cardiomegaly without evidence of CHF. Stable mediastinum. BONES AND SOFT TISSUES: Lower thoracic spine hardware is partially visible. No new osseous finding. IMPRESSION: 1. Dense left lower lobe consolidation with small left pleural effusion. Stable overall aeration. 2. Mild cardiomegaly without evidence of CHF. Electronically signed by: Francis Quam MD 02/01/2025 05:42 AM EST RP Workstation: [id number]   DG Chest Port 1 View Result Date: 02/01/2025 EXAM: 1 VIEW(S) XRAY OF THE CHEST 02/01/2025 03:42:00 AM COMPARISON: 01/06/2025 CLINICAL HISTORY: Questionable sepsis; evaluate for abnormality. FINDINGS: LUNGS AND PLEURA: Airspace opacity at left lung base. Possible small left pleural effusion. No pneumothorax. HEART AND MEDIASTINUM: Aortic atherosclerosis. No acute abnormality of the cardiac and mediastinal silhouettes. BONES AND SOFT TISSUES: Thoracic spinal fusion hardware. IMPRESSION: 1. Left basilar airspace opacity. 2. Possible small left pleural effusion. Electronically signed by: Greig Pique MD 02/01/2025 03:51 AM EST RP Workstation: [id number]      Assessment/Plan Principal Problem:   End of life care   Sepsis due to presumed aspiration pneumonia -Seen by  speech therapy with concerns for aspiration. 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 Transaminasemia Thrombocytosis possibly reactive Urinary retention with chronic  Foley catheter History of bladder cancer requiring previous TURBT      Admission status: GIP   Harlene RAYMOND Bowl [facility]   How to contact the [facility] Attending or Consulting provider 7A - 7P or covering provider during after hours 7P -7A, for this patient?  Check the care team in [facility] and look for a) attending/consulting [facility] provider listed and b) the [facility] team listed Log into [email or web address] and use 's universal password to access. If you do not have the password, please contact the hospital operator. Locate the [facility] provider you are looking for under [facility] and page to a number that you can be directly reached. If you still have difficulty reaching the provider, please page the [facility] (Director on Call) for the Hospitalists listed on amion for assistance.  02/02/2025, 4:30 PM         [1]  Allergies Allergen Reactions   Poison Oak Extract Hives and Itching   Sulfa Antibiotics Other (See Comments)    Unknown    Sulfonamide Derivatives Itching and Rash   "

## 2025-02-02 NOTE — TOC Progression Note (Signed)
 Transition of Care [facility]) - Progression Note    Patient Details  Name: Zachary [name] MRN: [id number] Date of Birth: 11-30-1946  [facility] CM/SW Contact  Hoy DELENA Bigness, LCSW Phone Number: 02/02/2025, 10:37 AM  Clinical Narrative:    CSW spoke w/ pt's spouse, [name] and confirmed plan for hospice referral w/ [facility]. Referral made to [facility] for residential hospice. Hospice RN to come to evaluate pt today. ICM will continue to follow.    Expected Discharge Plan: Hospice Medical Facility Barriers to Discharge: Continued Medical Work up               Expected Discharge Plan and Services       Living arrangements for the past 2 months: Skilled Nursing Facility                                       Social Drivers of Health (SDOH) Interventions SDOH Screenings   Food Insecurity: Patient Unable To Answer (02/01/2025)  Housing: Unknown (02/01/2025)  Transportation Needs: No Transportation Needs (12/29/2024)  Utilities: Not At Risk (12/29/2024)  Depression (PHQ2-9): High Risk (07/26/2023)  Social Connections: Patient Unable To Answer (02/01/2025)  Recent Concern: Social Connections - Moderately Isolated (12/29/2024)  Tobacco Use: Medium Risk (02/01/2025)    Readmission Risk Interventions    02/01/2025   11:08 AM 12/10/2024    9:44 AM 02/11/2023   12:02 PM  Readmission Risk Prevention Plan  Medication Screening  Complete   Transportation Screening Complete Complete Complete  PCP or Specialist Appt within 5-7 Days   Complete  PCP or Specialist Appt within 3-5 Days Not Complete    Home Care Screening   Complete  Medication Review (RN CM)   Complete  HRI or Home Care Consult Complete    Social Work Consult for Recovery Care Planning/Counseling Complete    Palliative Care Screening Not Complete    Medication Review Oceanographer) Complete

## 2025-02-02 NOTE — Progress Notes (Signed)
 Patient expired at 2056. Pronounced by this RN and Powell, AC. Dr. Manfred notified through secure chat. Unable to contact family, second attempt.

## 2025-02-02 NOTE — Progress Notes (Signed)
 " PROGRESS NOTE    Zachary [name]  FMW:[id number] DOB: Jul 11, 1946 DOA: 02/01/2025 PCP: Shona Norleen PEDLAR, MD    Brief Narrative:  Zachary [name] is a 79 y.o. male with medical history significant of HTN, HLD, CKD 3b, bladder cancer s/p multiple surgeries and TURBT (12/2022), low-grade papillary urethral carcinoma, urinary retention requiring intermittent catheterizations 3-4x/ day, pre-DM, OSA, chronic pain, nephrolithiasis, anxiety, and depression who presents to the emergency department via EMS from SNF due to shortness of breath.  Patient has not done well since discharge to skilled nursing facility.  He is currently aspirating his food/liquids.  Discussion was had with wife and in light of his many medical illnesses and poor response to rehab decision was made to transition to comfort care with hope for residential hospice placement.     Assessment and Plan: Sepsis due to presumed aspiration pneumonia -Seen by speech therapy with concerns for aspiration. 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 Transaminasemia Thrombocytosis possibly reactive Urinary retention with chronic Foley catheter History of bladder cancer requiring previous TURBT     DVT prophylaxis: SCDs Start: 02/01/25 9357    Code Status: Do not attempt resuscitation (DNR) - Comfort care Family Communication: Wife 2/5  Disposition Plan:  Level of care: Telemetry Status is: Inpatient     Consultants:  Palliative care   Subjective: Confused-removed oxygen   Objective: Vitals:   02/02/25 0031 02/02/25 0300 02/02/25 0514 02/02/25 0815  BP:      Pulse:      Resp: (!) 22 20 (!) 24   Temp:      TempSrc:      SpO2: 95%  92% 90%   Weight:      Height:        Intake/Output Summary (Last 24 hours) at 02/02/2025 1226 Last data filed at 02/01/2025 1605 Gross per 24 hour  Intake --  Output 325 ml  Net -325 ml   Filed Weights   02/01/25 0315 02/01/25 2011  Weight: 80.2 kg 79.8 kg    Examination:   General: Appearance:    Well developed, well nourished male who appears uncomfortable, had removed oxygen      Lungs:   Increased work of breathing  Heart:    Normal heart rate.    MS:   All extremities are intact.    Neurologic: Awake and confused       Data Reviewed: I have personally reviewed following labs and imaging studies  CBC: Recent Labs  Lab 02/01/25 0326  WBC 28.0*  NEUTROABS 25.2*  HGB 11.5*  HCT 37.2*  MCV 91.9  PLT 535*   Basic Metabolic Panel: Recent Labs  Lab 02/01/25 0326 02/01/25 0518  NA 141  --   K 5.6*  --   CL 106  --   CO2 18*  --   GLUCOSE 121*  --   BUN 115*  --   CREATININE 3.17*  --   CALCIUM 9.8  --   MG  --  2.0  PHOS  --  5.0*   GFR: Estimated Creatinine Clearance: 21.4 mL/min (A) (by C-G formula based on SCr of 3.17 mg/dL (H)). Liver Function Tests: Recent Labs  Lab 02/01/25 0326  AST 46*  ALT 64*  ALKPHOS 211*  BILITOT 0.3  PROT 8.9*  ALBUMIN  3.3*   No results for input(s): LIPASE, AMYLASE in the last 168 hours. No results for input(s): AMMONIA in the last 168 hours. Coagulation Profile: Recent Labs  Lab 02/01/25 0326  INR 1.3*   Cardiac Enzymes: No results for input(s): CKTOTAL, CKMB, CKMBINDEX, TROPONINI in the last 168 hours. BNP (last 3 results) Recent Labs    12/25/24 0922  PROBNP 2,105.0*   HbA1C: No results for input(s): HGBA1C in the last 72 hours. CBG: No results for input(s): GLUCAP in the last 168 hours. Lipid Profile: No results for input(s): CHOL, HDL, LDLCALC, TRIG, CHOLHDL, LDLDIRECT in the last 72 hours. Thyroid  Function Tests: No results for input(s): TSH, T4TOTAL, FREET4,  T3FREE, THYROIDAB in the last 72 hours. Anemia Panel: No results for input(s): VITAMINB12, FOLATE, FERRITIN, TIBC, IRON, RETICCTPCT in the last 72 hours. Sepsis Labs: Recent Labs  Lab 02/01/25 0321 02/01/25 0518  LATICACIDVEN 1.1 1.6    Recent Results (from the past 240 hours)  Blood Culture (routine x 2)     Status: None (Preliminary result)   Collection Time: 02/01/25  3:26 AM   Specimen: BLOOD  Result Value Ref Range Status   Specimen Description BLOOD RIGHT ANTECUBITAL  Final   Special Requests   Final    BOTTLES DRAWN AEROBIC AND ANAEROBIC Blood Culture adequate volume   Culture   Final    NO GROWTH 1 DAY Performed at [facility], [address] [place], [place] [address]    Report Status PENDING  Incomplete  Blood Culture (routine x 2)     Status: None (Preliminary result)   Collection Time: 02/01/25  3:28 AM   Specimen: BLOOD  Result Value Ref Range Status   Specimen Description BLOOD BLOOD LEFT WRIST  Final   Special Requests   Final    BOTTLES DRAWN AEROBIC AND ANAEROBIC Blood Culture adequate volume   Culture   Final    NO GROWTH 1 DAY Performed at [facility], [address], [place], [place] [address]    Report Status PENDING  Incomplete  Resp panel by RT-PCR (RSV, Flu A&B, Covid) Anterior Nasal Swab     Status: None   Collection Time: 02/01/25  3:30 AM   Specimen: Anterior Nasal Swab  Result Value Ref Range Status   SARS Coronavirus 2 by RT PCR NEGATIVE NEGATIVE Final    Comment: (NOTE) SARS-CoV-2 target nucleic acids are NOT DETECTED.  The SARS-CoV-2 RNA is generally detectable in upper respiratory specimens during the acute phase of infection. The lowest concentration of SARS-CoV-2 viral copies this assay can detect is 138 copies/mL. A negative result does not preclude SARS-Cov-2 infection and should not be used as the sole basis for treatment or other patient management decisions. A negative result may occur with  improper specimen  collection/handling, submission of specimen other than nasopharyngeal swab, presence of viral mutation(s) within the areas targeted by this assay, and inadequate number of viral copies(<138 copies/mL). A negative result must be combined with clinical observations, patient history, and epidemiological information. The expected result is Negative.  Fact Sheet for Patients:  bloggercourse.com  Fact Sheet for Healthcare Providers:  seriousbroker.it  This test is no t yet approved or cleared by the [place]  FDA and  has been authorized for detection and/or diagnosis of SARS-CoV-2 by FDA under an Emergency Use Authorization (EUA). This EUA will remain  in effect (meaning this test can be used) for the duration of the COVID-19  declaration under Section 564(b)(1) of the Act, 21 U.S.C.section 360bbb-3(b)(1), unless the authorization is terminated  or revoked sooner.       Influenza A by PCR NEGATIVE NEGATIVE Final   Influenza B by PCR NEGATIVE NEGATIVE Final    Comment: (NOTE) The Xpert Xpress SARS-CoV-2/FLU/RSV plus assay is intended as an aid in the diagnosis of influenza from Nasopharyngeal swab specimens and should not be used as a sole basis for treatment. Nasal washings and aspirates are unacceptable for Xpert Xpress SARS-CoV-2/FLU/RSV testing.  Fact Sheet for Patients: bloggercourse.com  Fact Sheet for Healthcare Providers: seriousbroker.it  This test is not yet approved or cleared by the [place]  FDA and has been authorized for detection and/or diagnosis of SARS-CoV-2 by FDA under an Emergency Use Authorization (EUA). This EUA will remain in effect (meaning this test can be used) for the duration of the COVID-19 declaration under Section 564(b)(1) of the Act, 21 U.S.C. section 360bbb-3(b)(1), unless the authorization is terminated or revoked.     Resp Syncytial  Virus by PCR NEGATIVE NEGATIVE Final    Comment: (NOTE) Fact Sheet for Patients: bloggercourse.com  Fact Sheet for Healthcare Providers: seriousbroker.it  This test is not yet approved or cleared by the [place]  FDA and has been authorized for detection and/or diagnosis of SARS-CoV-2 by FDA under an Emergency Use Authorization (EUA). This EUA will remain in effect (meaning this test can be used) for the duration of the COVID-19 declaration under Section 564(b)(1) of the Act, 21 U.S.C. section 360bbb-3(b)(1), unless the authorization is terminated or revoked.  Performed at [facility], [address], [place], [place] [address]   Urine Culture     Status: Abnormal (Preliminary result)   Collection Time: 02/01/25 10:01 AM   Specimen: Urine, Random  Result Value Ref Range Status   Specimen Description   Final    URINE, RANDOM Performed at [facility], [address], [place], [place] [address]    Special Requests   Final    NONE Reflexed from [id number] Performed at [facility], [address] [place], [place] [address]    Culture (A)  Final    40,000 COLONIES/mL GRAM NEGATIVE RODS SUSCEPTIBILITIES TO FOLLOW Performed at [facility], [address], [place], [place] [address]    Report Status PENDING  Incomplete         Radiology Studies: DG Chest Port 1 View Result Date: 02/01/2025 EXAM: 1 VIEW XRAY OF THE CHEST 02/01/2025 05:12:00 AM COMPARISON: Portable chest today at 02/01/2025 03:37:00 AM. CLINICAL HISTORY: Repeat chest pain. Follow-up post breathing apparatus. FINDINGS: LINES, TUBES AND DEVICES: Overlying telemetry leads, alligator clip on the left and plastic tubing. LUNGS AND PLEURA: Dense left lower lobe consolidation continues to be seen. Small left pleural effusion. The lungs are otherwise clear. No pneumothorax. HEART AND MEDIASTINUM: Mild cardiomegaly without evidence of CHF. Stable mediastinum. BONES AND  SOFT TISSUES: Lower thoracic spine hardware is partially visible. No new osseous finding. IMPRESSION: 1. Dense left lower lobe consolidation with small left pleural effusion. Stable overall aeration. 2. Mild cardiomegaly without evidence of CHF. Electronically signed by: Francis Quam MD 02/01/2025 05:42 AM EST RP Workstation: [id number]   DG Chest Port 1 View Result Date: 02/01/2025 EXAM: 1 VIEW(S) XRAY OF THE CHEST 02/01/2025 03:42:00 AM COMPARISON: 01/06/2025 CLINICAL HISTORY: Questionable sepsis; evaluate for abnormality. FINDINGS: LUNGS AND PLEURA: Airspace opacity at left lung base. Possible small left pleural effusion. No pneumothorax. HEART AND MEDIASTINUM: Aortic atherosclerosis. No acute abnormality of the cardiac  and mediastinal silhouettes. BONES AND SOFT TISSUES: Thoracic spinal fusion hardware. IMPRESSION: 1. Left basilar airspace opacity. 2. Possible small left pleural effusion. Electronically signed by: Greig Pique MD 02/01/2025 03:51 AM EST RP Workstation: [id number]        Scheduled Meds:  Chlorhexidine  Gluconate Cloth  6 each Topical Q0600   dextromethorphan -guaiFENesin   1 tablet Oral BID    morphine  injection  2 mg Intravenous Q6H   Continuous Infusions:   LOS: 1 day    Time spent: 45 minutes spent on chart review, discussion with nursing staff, consultants, updating family and interview/physical exam; more than 50% of that time was spent in counseling and/or coordination of care.    Harlene RAYMOND Bowl, DO [facility] Available via Epic secure chat 7am-7pm After these hours, please refer to coverage provider listed on [email or web address] 02/02/2025, 12:26 PM   "

## 2025-02-02 NOTE — Discharge Summary (Signed)
 "     Physician Discharge Summary  Zachary [name] FMW:[id number] DOB: 10-05-1946 DOA: 02/01/2025  PCP: Shona Norleen PEDLAR, MD  Admit date: 02/01/2025 Discharge date: 02/02/2025  Admitted From:  Discharge disposition: being d/c'd for GIP    Discharge Diagnosis:   Principal Problem:   Sepsis due to pneumonia (HCC)   Sepsis due to presumed aspiration pneumonia -Seen by speech therapy with concerns for aspiration. 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 Transaminasemia Thrombocytosis possibly reactive Urinary retention with chronic Foley catheter History of bladder cancer requiring previous TURBT      Medical Consultants:    Palliative care  Discharge Exam:   Vitals:   02/02/25 1238 02/02/25 1310  BP:  (!) 114/54  Pulse:  72  Resp: (!) 37 (!) 36  Temp:    SpO2: (!) 77% (!) 73%   Vitals:   02/02/25 0514 02/02/25 0815 02/02/25 1238 02/02/25 1310  BP:    (!) 114/54  Pulse:    72  Resp: (!) 24  (!) 37 (!) 36  Temp:      TempSrc:      SpO2: 92% 90% (!) 77% (!) 73%  Weight:      Height:           The results of significant diagnostics from this hospitalization (including imaging, microbiology, ancillary and laboratory) are listed below for reference.     Procedures and Diagnostic Studies:   DG Chest Port 1 View Result Date: 02/01/2025 EXAM: 1 VIEW XRAY OF THE CHEST 02/01/2025 05:12:00 AM COMPARISON: Portable chest today at 02/01/2025 03:37:00 AM. CLINICAL HISTORY: Repeat chest pain. Follow-up post breathing apparatus. FINDINGS: LINES, TUBES AND DEVICES: Overlying telemetry leads, alligator clip on the left and plastic tubing. LUNGS AND PLEURA: Dense left lower lobe  consolidation continues to be seen. Small left pleural effusion. The lungs are otherwise clear. No pneumothorax. HEART AND MEDIASTINUM: Mild cardiomegaly without evidence of CHF. Stable mediastinum. BONES AND SOFT TISSUES: Lower thoracic spine hardware is partially visible. No new osseous finding. IMPRESSION: 1. Dense left lower lobe consolidation with small left pleural effusion. Stable overall aeration. 2. Mild cardiomegaly without evidence of CHF. Electronically signed by: Francis Quam MD 02/01/2025 05:42 AM EST RP Workstation: [id number]   DG Chest Port 1 View Result Date: 02/01/2025 EXAM: 1 VIEW(S) XRAY OF THE CHEST 02/01/2025 03:42:00 AM COMPARISON: 01/06/2025 CLINICAL HISTORY: Questionable sepsis; evaluate for abnormality. FINDINGS: LUNGS AND PLEURA: Airspace opacity at left lung base. Possible small left pleural effusion. No pneumothorax. HEART AND MEDIASTINUM: Aortic atherosclerosis. No acute abnormality of the cardiac and mediastinal silhouettes. BONES AND SOFT TISSUES: Thoracic spinal fusion hardware. IMPRESSION: 1. Left basilar airspace opacity. 2. Possible small left pleural effusion. Electronically signed by: Greig Pique MD 02/01/2025 03:51 AM EST RP Workstation: [id number]     Labs:   Basic Metabolic Panel: Recent Labs  Lab 02/01/25 0326 02/01/25 0518  NA 141  --   K 5.6*  --   CL 106  --   CO2 18*  --   GLUCOSE 121*  --   BUN 115*  --   CREATININE 3.17*  --   CALCIUM 9.8  --   MG  --  2.0  PHOS  --  5.0*  GFR Estimated Creatinine Clearance: 21.4 mL/min (A) (by C-G formula based on SCr of 3.17 mg/dL (H)). Liver Function Tests: Recent Labs  Lab 02/01/25 0326  AST 46*  ALT 64*  ALKPHOS 211*  BILITOT 0.3  PROT 8.9*  ALBUMIN  3.3*   No results for input(s): LIPASE, AMYLASE in the last 168 hours. No results for input(s): AMMONIA in the last 168 hours. Coagulation profile Recent Labs  Lab 02/01/25 0326  INR 1.3*    CBC: Recent Labs  Lab 02/01/25 0326   WBC 28.0*  NEUTROABS 25.2*  HGB 11.5*  HCT 37.2*  MCV 91.9  PLT 535*   Cardiac Enzymes: No results for input(s): CKTOTAL, CKMB, CKMBINDEX, TROPONINI in the last 168 hours. BNP: Invalid input(s): POCBNP CBG: No results for input(s): GLUCAP in the last 168 hours. D-Dimer No results for input(s): DDIMER in the last 72 hours. Hgb A1c No results for input(s): HGBA1C in the last 72 hours. Lipid Profile No results for input(s): CHOL, HDL, LDLCALC, TRIG, CHOLHDL, LDLDIRECT in the last 72 hours. Thyroid  function studies No results for input(s): TSH, T4TOTAL, T3FREE, THYROIDAB in the last 72 hours.  Invalid input(s): FREET3 Anemia work up No results for input(s): VITAMINB12, FOLATE, FERRITIN, TIBC, IRON, RETICCTPCT in the last 72 hours. Microbiology Recent Results (from the past 240 hours)  Blood Culture (routine x 2)     Status: None (Preliminary result)   Collection Time: 02/01/25  3:26 AM   Specimen: BLOOD  Result Value Ref Range Status   Specimen Description BLOOD RIGHT ANTECUBITAL  Final   Special Requests   Final    BOTTLES DRAWN AEROBIC AND ANAEROBIC Blood Culture adequate volume   Culture   Final    NO GROWTH 1 DAY Performed at [facility], [address], [place], [place] [address]    Report Status PENDING  Incomplete  Blood Culture (routine x 2)     Status: None (Preliminary result)   Collection Time: 02/01/25  3:28 AM   Specimen: BLOOD  Result Value Ref Range Status   Specimen Description BLOOD BLOOD LEFT WRIST  Final   Special Requests   Final    BOTTLES DRAWN AEROBIC AND ANAEROBIC Blood Culture adequate volume   Culture   Final    NO GROWTH 1 DAY Performed at [facility], [address] [place], [place] [address]    Report Status PENDING  Incomplete  Resp panel by RT-PCR (RSV, Flu A&B, Covid) Anterior Nasal Swab     Status: None   Collection Time: 02/01/25  3:30 AM   Specimen: Anterior Nasal Swab  Result  Value Ref Range Status   SARS Coronavirus 2 by RT PCR NEGATIVE NEGATIVE Final    Comment: (NOTE) SARS-CoV-2 target nucleic acids are NOT DETECTED.  The SARS-CoV-2 RNA is generally detectable in upper respiratory specimens during the acute phase of infection. The lowest concentration of SARS-CoV-2 viral copies this assay can detect is 138 copies/mL. A negative result does not preclude SARS-Cov-2 infection and should not be used as the sole basis for treatment or other patient management decisions. A negative result may occur with  improper specimen collection/handling, submission of specimen other than nasopharyngeal swab, presence of viral mutation(s) within the areas targeted by this assay, and inadequate number of viral copies(<138 copies/mL). A negative result must be combined with clinical observations, patient history, and epidemiological information. The expected result is Negative.  Fact Sheet for Patients:  bloggercourse.com  Fact Sheet for Healthcare Providers:  seriousbroker.it  This test is  no t yet approved or cleared by the [place]  FDA and  has been authorized for detection and/or diagnosis of SARS-CoV-2 by FDA under an Emergency Use Authorization (EUA). This EUA will remain  in effect (meaning this test can be used) for the duration of the COVID-19 declaration under Section 564(b)(1) of the Act, 21 U.S.C.section 360bbb-3(b)(1), unless the authorization is terminated  or revoked sooner.       Influenza A by PCR NEGATIVE NEGATIVE Final   Influenza B by PCR NEGATIVE NEGATIVE Final    Comment: (NOTE) The Xpert Xpress SARS-CoV-2/FLU/RSV plus assay is intended as an aid in the diagnosis of influenza from Nasopharyngeal swab specimens and should not be used as a sole basis for treatment. Nasal washings and aspirates are unacceptable for Xpert Xpress SARS-CoV-2/FLU/RSV testing.  Fact Sheet for  Patients: bloggercourse.com  Fact Sheet for Healthcare Providers: seriousbroker.it  This test is not yet approved or cleared by the [place]  FDA and has been authorized for detection and/or diagnosis of SARS-CoV-2 by FDA under an Emergency Use Authorization (EUA). This EUA will remain in effect (meaning this test can be used) for the duration of the COVID-19 declaration under Section 564(b)(1) of the Act, 21 U.S.C. section 360bbb-3(b)(1), unless the authorization is terminated or revoked.     Resp Syncytial Virus by PCR NEGATIVE NEGATIVE Final    Comment: (NOTE) Fact Sheet for Patients: bloggercourse.com  Fact Sheet for Healthcare Providers: seriousbroker.it  This test is not yet approved or cleared by the [place]  FDA and has been authorized for detection and/or diagnosis of SARS-CoV-2 by FDA under an Emergency Use Authorization (EUA). This EUA will remain in effect (meaning this test can be used) for the duration of the COVID-19 declaration under Section 564(b)(1) of the Act, 21 U.S.C. section 360bbb-3(b)(1), unless the authorization is terminated or revoked.  Performed at [facility], [address], [place], [place] [address]   Urine Culture     Status: Abnormal (Preliminary result)   Collection Time: 02/01/25 10:01 AM   Specimen: Urine, Random  Result Value Ref Range Status   Specimen Description   Final    URINE, RANDOM Performed at [facility], [address] [place], [place] [address]    Special Requests   Final    NONE Reflexed from [id number] Performed at [facility], [address], [place], [place] [address]    Culture (A)  Final    40,000 COLONIES/mL ESCHERICHIA COLI SUSCEPTIBILITIES TO FOLLOW Performed at [facility], [address] [place], [place] [address]    Report Status PENDING  Incomplete     Discharge Instructions:       Signed:  Harlene RAYMOND Bowl DO  [facility] 02/02/2025, 2:18 PM      "

## 2025-02-02 NOTE — Progress Notes (Signed)
 SLP Cancellation Note  Patient Details Name: Zachary [name] MRN: [id number] DOB: May 09, 1946   Cancelled treatment:       Reason Eval/Treat Not Completed: Other (comment) Pt is being transitioned to comfort care and family is pursuing placement at a residential hospice facility. Defer further comfort and diet recommendations to hospice. Our service will sign off. Thank you for allowing us  to participate in the care of this patient,  [name]. [name], CCC-SLP Speech Language Pathologist    [name] Clois 02/02/2025, 9:38 AM

## 2025-02-02 NOTE — Progress Notes (Signed)
 This patient will be changed from In patient Telemetry to [facility] for Hospice care.

## 2025-02-02 NOTE — Progress Notes (Signed)
 SPIRITUAL CARE AND COUNSELING CONSULT NOTE   VISIT SUMMARY Referred to patient and family via Palliative NP and request for consult. Found patient wife, [name] bedside today. She welcomed this scientific laboratory technician. Engaged her in reflection around her husbands life, relationships, upbringing, spiritual heritage, and illness story. She shared openly today and was often tearful but appropriate in her tears. She shared deep things related to her relationship with her husband that may be impacting her grief journey. Chaplain provided a safe space for her to reflect openly and to work through emotions, often validating her and assisting her finding meaning. Provided prayer to close the visit.  Will benefit from follow-up. Chaplain will visit in order to provide spiritual support and to assess for spiritual need.   SPIRITUAL ENCOUNTER                                                                                                                                                                      Type of Visit: Initial Care provided to:: Pt and family Conversation partners present during encounter: Nurse Referral source: IDT Rounds, APP Reason for visit: End-of-life OnCall Visit: No   SPIRITUAL FRAMEWORK  Presenting Themes: Meaning/purpose/sources of inspiration, Impactful experiences and emotions, Goals in life/care, Courage hope and growth, Values and beliefs, Significant life change, Community and relationships, Rituals and practive, Coping tools Community/Connection: Family, Significant other Patient Stress Factors: None identified Family Stress Factors: Loss, Loss of control, Major life changes   GOALS       INTERVENTIONS   Spiritual Care Interventions Made: Established relationship of care and support, Compassionate presence, Reflective listening, Normalization of emotions, Narrative/life review, Meaning making, Bereavement/grief support, Prayer, Encouragement, Supported grief process     INTERVENTION OUTCOMES   Outcomes: Connection to spiritual care, Awareness of support, Awareness around self/spiritual resourses, Autonomy/agency, Patient family open to resources, Connection to values and goals of care, Connected to spiritual community  SPIRITUAL CARE PLAN   Spiritual Care Issues Still Outstanding: Elia will continue to follow, Referring to oncoming chaplain for further support    If immediate needs arise, please consult Zelda Salmon AMION schedule for chaplain coverage   Tanda PORTA [facility], Chaplain  02/02/2025 4:10 PM

## 2025-02-02 NOTE — Progress Notes (Signed)
 Patient spouse and family at bedside, emotional support given.

## 2025-02-02 NOTE — Consult Note (Signed)
 " Consultation Note Date: 02/02/2025   Patient Name: Zachary [name]  DOB: 1946/12/22  MRN: [id number]  Age / Sex: 79 y.o., male   PCP: Shona Norleen PEDLAR, MD Referring Physician: No att. providers found  Reason for Consultation: Non pain symptom management, Pain control, and Terminal Care     Chief Complaint/History of Present Illness:  Zachary [name] is a 79 year old male with history of hypertension, hyperlipidemia, CKD 3B, bladder cancer status post multiple surgeries and TURP, low-grade papillary urothelial carcinoma, urinary retention, OSA, chronic pain, nephrolithiasis, anxiety and depression who is known to me from prior admission at [facility] last month.  He was discharged to skilled facility and has been aspirating food and liquid.  Due to continued decline, wife was made to transition to comfort care.  Palliative consulted to continue support for family and recommendations on symptom management.  I saw and examined patient today.  He was awake but appeared fearful and while he is not able to hear carry conversation, he does nod his head yes when asked about shortness of breath.  He is currently on high flow nasal cannula at 30 L/min.  I told him that I was going to work to better control his symptoms and focus on making sure that he is more comfortable moving forward.  He was not really able to respond to this.  I called and spoke with patient's wife, Zachary [name].  Discussed with Zachary [name] regarding clinical course of the last few days, decision to transition to comfort care, and plan for care moving forward.  Currently, he has ordered as needed medication including morphine  and Ativan  as needed.  Discussed with her that he appears to me that he could use a little bit of baseline pain medication in order to better control his symptoms overall.  She is in agreement with plan to schedule morphine  4 times daily with hopes of better controlling symptoms overall.  She endorses that she is hopeful to work to  transition to [name] for continued focus on comfort and end-of-life care.  I talk with her about high flow nasal cannula and how this is acting as a form of life support at this point in time.  We discussed that it is not something that can be done outside of the hospital.  I talk with her about waiting for evaluation to see if he is excepted to [facility] at which point in time we would continue to work to titrate off of high flow nasal cannula to something he can be replicated outside of the hospital.  We discussed plan to continue increasing medications for pain and shortness of breath as we work to titrate off high flow nasal cannula.  Overall, she endorses that the main focus is on his comfort moving forward.  She does understand prognosis is likely days at this point.  She expressed thanks for care provided and denied other questions at this time.  Primary Diagnoses  Present on Admission:  Sepsis due to pneumonia [facility])   Palliative Review of Systems: Limited review of systems due to mental status.  He does seem to nod yes when asked about shortness of breath.  Past Medical History:  Diagnosis Date   Anxiety    takes Valium  as needed   Arthritis    Bladder cancer (HCC)    takes Rapaflo daily   Blood dyscrasia    07/08/16: pt told he was a free bleeder after bleeding a lot when derm cut off mole on forehead.  No excessive bleeding with minor wounds at home, no bleeding problems perioperatively with prior surgeries.   Chronic back pain    spondylolisthesis   Cluster headaches    Depression    takes Lexapro  daily   Essential hypertension, benign    takes Diovan -HCT daily   Hearing loss    hearing aids   History of kidney stones    Hx of complications due to general anesthesia    Aborted surgery 07/15/2016 due to hypotension per pt   Intertrochanteric fracture of left femur (HCC) 02/09/2023   Joint pain    Kidney stone 03/2019   passed stone   Obstructive sleep apnea on  CPAP    uses CPAP @ night   Parkinsonism (HCC)    Pneumonia    several times--last time about 3-4 yrs ago   Pre-diabetes    Prediabetes    Syncopal episodes    Thyroid  condition    Social History   Socioeconomic History   Marital status: Married    Spouse name: Zachary [name]    Number of children: 0   Years of education: 13   Highest education level: Not on file  Occupational History   Occupation: Retired    Comment: med [employer]    Employer: UNEMPLOYED  Tobacco Use   Smoking status: Former    Current packs/day: 0.00    Average packs/day: 0.3 packs/day for 1 year (0.3 ttl pk-yrs)    Types: Cigarettes, Cigars    Start date: 12/31/1975    Quit date: 12/30/1976    Years since quitting: 48.1    Passive exposure: Past   Smokeless tobacco: Never   Tobacco comments:    1985  Vaping Use   Vaping status: Never Used  Substance and Sexual Activity   Alcohol  use: No    Comment: quit 1990   Drug use: No   Sexual activity: Yes  Other Topics Concern   Not on file  Social History Narrative   Lives with wife    caffeine- sodas   Right handed   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    Social Drivers of Health   Tobacco Use: Medium Risk (02/01/2025)   Patient History    Smoking Tobacco Use: Former    Smokeless Tobacco Use: Never    Passive Exposure: Past  Physicist, Medical Strain: Not on file  Food Insecurity: Patient Unable To Answer (02/01/2025)   Epic    Worried About Programme Researcher, Broadcasting/film/video in the Last Year: Patient unable to answer    Ran Out of Food in the Last Year: Patient unable to answer  Transportation Needs: No Transportation Needs (12/29/2024)   Epic    Lack of Transportation (Medical): No    Lack of Transportation (Non-Medical): No  Physical Activity: Not on file  Stress: Not on file  Social Connections: Patient Unable To Answer (02/01/2025)   Social Connection and Isolation Panel    Frequency of Communication with Friends and Family: Patient unable to answer     Frequency of Social Gatherings with Friends and Family: Patient unable to answer    Attends Religious Services: Patient unable to answer    Active Member of Clubs or Organizations: Patient unable to answer    Attends Banker Meetings: Patient unable to answer    Marital Status: Patient unable to answer  Recent Concern: Social Connections - Moderately Isolated (12/29/2024)   Social Connection and Isolation Panel    Frequency of Communication with Friends and  Family: More than three times a week    Frequency of Social Gatherings with Friends and Family: Never    Attends Religious Services: Never    Database Administrator or Organizations: No    Attends Banker Meetings: Never    Marital Status: Married  Depression (PHQ2-9): High Risk (07/26/2023)   Depression (PHQ2-9)    PHQ-2 Score: 18  Alcohol  Screen: Not on file  Housing: Unknown (02/01/2025)   Epic    Unable to Pay for Housing in the Last Year: Patient unable to answer    Number of Times Moved in the Last Year: 0    Homeless in the Last Year: Patient unable to answer  Utilities: Not At Risk (12/29/2024)   Epic    Threatened with loss of utilities: No  Health Literacy: Not on file   Family History  Problem Relation Age of Onset   Hypertension Father    Cancer Father        Prostate   Depression Mother    Hypertension Mother    Alcohol  abuse Mother    COPD Mother        passed 07-08-17   Hypertension Sister    Anxiety disorder Sister    Alcohol  abuse Maternal Grandfather    ADD / ADHD Neg Hx    Bipolar disorder Neg Hx    Dementia Neg Hx    Drug abuse Neg Hx    OCD Neg Hx    Paranoid behavior Neg Hx    Schizophrenia Neg Hx    Seizures Neg Hx    Sexual abuse Neg Hx    Physical abuse Neg Hx    Scheduled Meds:  morphine  (PF)       Continuous Infusions: PRN Meds:.morphine  (PF) Allergies[1] CBC:    Component Value Date/Time   WBC 28.0 (H) 02/01/2025 0326   HGB 11.5 (L) 02/01/2025 0326    HCT 37.2 (L) 02/01/2025 0326   PLT 535 (H) 02/01/2025 0326   MCV 91.9 02/01/2025 0326   NEUTROABS 25.2 (H) 02/01/2025 0326   LYMPHSABS 1.6 02/01/2025 0326   MONOABS 0.8 02/01/2025 0326   EOSABS 0.1 02/01/2025 0326   BASOSABS 0.1 02/01/2025 0326   Comprehensive Metabolic Panel:    Component Value Date/Time   NA 141 02/01/2025 0326   K 5.6 (H) 02/01/2025 0326   CL 106 02/01/2025 0326   CO2 18 (L) 02/01/2025 0326   BUN 115 (H) 02/01/2025 0326   CREATININE 3.17 (H) 02/01/2025 0326   GLUCOSE 121 (H) 02/01/2025 0326   CALCIUM 9.8 02/01/2025 0326   AST 46 (H) 02/01/2025 0326   ALT 64 (H) 02/01/2025 0326   ALKPHOS 211 (H) 02/01/2025 0326   BILITOT 0.3 02/01/2025 0326   PROT 8.9 (H) 02/01/2025 0326   ALBUMIN  3.3 (L) 02/01/2025 0326    Physical Exam: Vital Signs: BP (!) 114/54   Pulse 72   Temp 98 F (36.7 C) (Oral)   Resp (!) 36   Ht 6' 0.5 (1.842 m)   Wt 79.8 kg   SpO2 (!) 74%   BMI 23.53 kg/m  SpO2: SpO2: (!) 74 % O2 Device: O2 Device: Heated High Flow Nasal Cannula O2 Flow Rate: O2 Flow Rate (L/min): 10 L/min Intake/output summary: No intake or output data in the 24 hours ending 02/02/25 1816 LBM: Last BM Date :  (prior to admission) Baseline Weight: Weight: 80.2 kg Most recent weight: Weight: 79.8 kg  General: Alert, minimally interactive.  Does appear to have anxiety.  Eyes: conjunctiva clear, anicteric sclera HENT: normocephalic, atraumatic, mucous membranes tacky Cardiovascular: RRR Respiratory: Slight increased work of breathing.  Tachypneic.   Abdomen: not distended          Palliative Performance Scale: 10              Additional Data Reviewed: Recent Labs    02/01/25 0326  WBC 28.0*  HGB 11.5*  PLT 535*  NA 141  BUN 115*  CREATININE 3.17*    Imaging: DG Chest Port 1 View EXAM: 1 VIEW XRAY OF THE CHEST 02/01/2025 05:12:00 AM  COMPARISON: Portable chest today at 02/01/2025 03:37:00 AM.  CLINICAL HISTORY: Repeat chest pain. Follow-up post  breathing apparatus.  FINDINGS:  LINES, TUBES AND DEVICES: Overlying telemetry leads, alligator clip on the left and plastic tubing.  LUNGS AND PLEURA: Dense left lower lobe consolidation continues to be seen. Small left pleural effusion. The lungs are otherwise clear. No pneumothorax.  HEART AND MEDIASTINUM: Mild cardiomegaly without evidence of CHF. Stable mediastinum.  BONES AND SOFT TISSUES: Lower thoracic spine hardware is partially visible. No new osseous finding.  IMPRESSION: 1. Dense left lower lobe consolidation with small left pleural effusion. Stable overall aeration. 2. Mild cardiomegaly without evidence of CHF.  Electronically signed by: Francis Quam MD 02/01/2025 05:42 AM EST RP Workstation: [id number] DG Chest Port 1 View EXAM: 1 VIEW(S) XRAY OF THE CHEST 02/01/2025 03:42:00 AM  COMPARISON: 01/06/2025  CLINICAL HISTORY: Questionable sepsis; evaluate for abnormality.  FINDINGS:  LUNGS AND PLEURA: Airspace opacity at left lung base. Possible small left pleural effusion. No pneumothorax.  HEART AND MEDIASTINUM: Aortic atherosclerosis. No acute abnormality of the cardiac and mediastinal silhouettes.  BONES AND SOFT TISSUES: Thoracic spinal fusion hardware.  IMPRESSION: 1. Left basilar airspace opacity. 2. Possible small left pleural effusion.  Electronically signed by: Greig Pique MD 02/01/2025 03:51 AM EST RP Workstation: [id number]    I personally reviewed recent imaging.   Palliative Care Assessment and Plan Summary of Established Goals of Care and Medical Treatment Preferences    # Complex medical decision making/goals of care  - Discussed with patient's wife via phone.  Confirmed plan for comfort care moving forward with hope for transition to residential hospice for end-of-life care.  - Discussed need to work to transition off of high flow nasal cannula as this is not something that can be replicated outside of the hospital.  We  discussed that high flow nasal cannula can be distressful due to drying nature of large amounts of oxygen  and medications would be recommended route of controlling symptoms moving forward.  -  Code Status: Do not attempt resuscitation (DNR) - Comfort care  Prognosis: Hours - Days  # Symptom management Patient is receiving these palliative interventions for symptom management with an intent to improve quality of life.   - Pain/dyspnea: Currently ordered as needed morphine .  Has not been receiving and would likely be more comfortable with scheduled medication.  Will therefore schedule morphine  2 mg every 6 hours.  He also has additional morphine  available as needed.  Will need to continue to titrate up opioids as we were to titrate down oxygen .  Overall goal is to get to inpatient hospice and he will need to be off of high flow in order for this to happen.  - Anxiety: Ativan  as needed  - Agitation: Haldol  as needed  - Excess secretions: Robinul  as needed  - Discussed care plan with Dr. Juvenal in person today prior to seeing patient and via  secure chat after evaluation  # Psycho-social/Spiritual Support:  - Support System: Wife - Desire for further Chaplain support: Already involved  # Discharge Planning:  Hospice facility  Thank you for allowing the palliative care team to participate in the care [facility]  Amaryllis Meissner, MD Palliative Care Provider PMT # [phone]  If patient remains symptomatic despite maximum doses, please call PMT at [phone] between 0700 and 1900. Outside of these hours, please call attending, as PMT does not have night coverage.   I personally spent a total of 65 minutes in the care of the patient today including preparing to see the patient, getting/reviewing separately obtained history, performing a medically appropriate exam/evaluation, counseling and educating, placing orders, referring and communicating with other health care professionals, and documenting  clinical information in the EHR.     [1]  Allergies Allergen Reactions   Poison Oak Extract Hives and Itching   Sulfa Antibiotics Other (See Comments)    Unknown    Sulfonamide Derivatives Itching and Rash   "

## 2025-02-02 NOTE — Progress Notes (Signed)
 Hospice RN in room to evaluate patient. Patient's wife present. Patients o2 sat 68 on 30L [facility]NC. Started the process of weaning down O2. O2 sat 78% on 20L [facility] after 15 minutes weaned down O2 to 10L [facility]NC, patient maintaining O2 sat 74-77%. Will continue to monitor.

## 2025-02-02 NOTE — Care Management Important Message (Signed)
 Important Message  Patient Details  Name: Zachary [name] MRN: [id number] Date of Birth: [birth year]   Important Message Given:  N/A - LOS <3 / Initial given by admissions     [name] Ada 02/02/2025, 3:11 PM

## 2025-02-02 NOTE — Progress Notes (Signed)
 Patient respirations have become more sparse and shallow. Attempted to notify emergency contacts with no answer.

## 2025-02-07 ENCOUNTER — Ambulatory Visit (HOSPITAL_COMMUNITY)

## 2025-02-07 ENCOUNTER — Encounter (HOSPITAL_COMMUNITY): Payer: Self-pay
